# Patient Record
Sex: Female | Born: 1935
Health system: Southern US, Community
[De-identification: ages and names within clinical notes are randomized; demographics above are authoritative.]

## PROBLEM LIST (undated history)

## (undated) DIAGNOSIS — E039 Hypothyroidism, unspecified: Secondary | ICD-10-CM

## (undated) DIAGNOSIS — D649 Anemia, unspecified: Secondary | ICD-10-CM

## (undated) DIAGNOSIS — I10 Essential (primary) hypertension: Secondary | ICD-10-CM

## (undated) DIAGNOSIS — I480 Paroxysmal atrial fibrillation: Secondary | ICD-10-CM

## (undated) DIAGNOSIS — G43909 Migraine, unspecified, not intractable, without status migrainosus: Secondary | ICD-10-CM

## (undated) DIAGNOSIS — Z973 Presence of spectacles and contact lenses: Secondary | ICD-10-CM

## (undated) DIAGNOSIS — Z950 Presence of cardiac pacemaker: Secondary | ICD-10-CM

## (undated) DIAGNOSIS — R319 Hematuria, unspecified: Secondary | ICD-10-CM

## (undated) DIAGNOSIS — J309 Allergic rhinitis, unspecified: Secondary | ICD-10-CM

## (undated) DIAGNOSIS — N3281 Overactive bladder: Secondary | ICD-10-CM

## (undated) DIAGNOSIS — C50912 Malignant neoplasm of unspecified site of left female breast: Secondary | ICD-10-CM

## (undated) DIAGNOSIS — M1990 Unspecified osteoarthritis, unspecified site: Secondary | ICD-10-CM

## (undated) DIAGNOSIS — I471 Supraventricular tachycardia: Secondary | ICD-10-CM

## (undated) DIAGNOSIS — I872 Venous insufficiency (chronic) (peripheral): Secondary | ICD-10-CM

## (undated) DIAGNOSIS — I341 Nonrheumatic mitral (valve) prolapse: Secondary | ICD-10-CM

## (undated) HISTORY — DX: Venous insufficiency (chronic) (peripheral): I87.2

## (undated) HISTORY — PX: TUBAL LIGATION: SHX77

## (undated) HISTORY — DX: Allergic rhinitis, unspecified: J30.9

## (undated) HISTORY — DX: Essential (primary) hypertension: I10

## (undated) HISTORY — PX: ABDOMINAL HYSTERECTOMY: SHX81

## (undated) HISTORY — DX: Anemia, unspecified: D64.9

## (undated) HISTORY — PX: DILATION AND CURETTAGE OF UTERUS: SHX78

## (undated) HISTORY — DX: Nonrheumatic mitral (valve) prolapse: I34.1

## (undated) HISTORY — DX: Supraventricular tachycardia: I47.1

## (undated) HISTORY — DX: Paroxysmal atrial fibrillation: I48.0

## (undated) HISTORY — DX: Migraine, unspecified, not intractable, without status migrainosus: G43.909

## (undated) HISTORY — DX: Hematuria, unspecified: R31.9

## (undated) HISTORY — PX: COLONOSCOPY: SHX174

---

## 1988-12-13 HISTORY — PX: BREAST SURGERY: SHX581

## 1998-03-27 ENCOUNTER — Ambulatory Visit (HOSPITAL_COMMUNITY): Admission: RE | Admit: 1998-03-27 | Discharge: 1998-03-27 | Payer: Self-pay | Admitting: Geriatric Medicine

## 1998-06-23 ENCOUNTER — Ambulatory Visit: Admission: RE | Admit: 1998-06-23 | Discharge: 1998-06-23 | Payer: Self-pay | Admitting: Geriatric Medicine

## 1999-07-10 ENCOUNTER — Ambulatory Visit (HOSPITAL_COMMUNITY): Admission: RE | Admit: 1999-07-10 | Discharge: 1999-07-10 | Payer: Self-pay | Admitting: Geriatric Medicine

## 1999-07-10 ENCOUNTER — Encounter: Payer: Self-pay | Admitting: Geriatric Medicine

## 2000-10-20 ENCOUNTER — Ambulatory Visit (HOSPITAL_COMMUNITY): Admission: RE | Admit: 2000-10-20 | Discharge: 2000-10-20 | Payer: Self-pay | Admitting: Gastroenterology

## 2001-02-06 ENCOUNTER — Encounter: Payer: Self-pay | Admitting: Emergency Medicine

## 2001-02-06 ENCOUNTER — Inpatient Hospital Stay (HOSPITAL_COMMUNITY): Admission: EM | Admit: 2001-02-06 | Discharge: 2001-02-08 | Payer: Self-pay | Admitting: Emergency Medicine

## 2001-02-07 ENCOUNTER — Encounter: Payer: Self-pay | Admitting: Interventional Cardiology

## 2001-02-07 ENCOUNTER — Encounter: Payer: Self-pay | Admitting: *Deleted

## 2001-02-08 ENCOUNTER — Encounter: Payer: Self-pay | Admitting: *Deleted

## 2001-09-20 ENCOUNTER — Encounter: Payer: Self-pay | Admitting: Surgery

## 2001-09-20 ENCOUNTER — Encounter (INDEPENDENT_AMBULATORY_CARE_PROVIDER_SITE_OTHER): Payer: Self-pay | Admitting: *Deleted

## 2001-09-20 ENCOUNTER — Ambulatory Visit (HOSPITAL_BASED_OUTPATIENT_CLINIC_OR_DEPARTMENT_OTHER): Admission: RE | Admit: 2001-09-20 | Discharge: 2001-09-20 | Payer: Self-pay | Admitting: Surgery

## 2002-01-04 ENCOUNTER — Encounter: Payer: Self-pay | Admitting: Orthopedic Surgery

## 2002-01-11 ENCOUNTER — Encounter: Payer: Self-pay | Admitting: Orthopedic Surgery

## 2002-01-11 ENCOUNTER — Inpatient Hospital Stay (HOSPITAL_COMMUNITY): Admission: RE | Admit: 2002-01-11 | Discharge: 2002-01-12 | Payer: Self-pay | Admitting: Orthopedic Surgery

## 2009-01-14 ENCOUNTER — Encounter: Admission: RE | Admit: 2009-01-14 | Discharge: 2009-01-14 | Payer: Self-pay | Admitting: Geriatric Medicine

## 2009-03-03 ENCOUNTER — Other Ambulatory Visit: Admission: RE | Admit: 2009-03-03 | Discharge: 2009-03-03 | Payer: Self-pay | Admitting: Geriatric Medicine

## 2009-04-30 ENCOUNTER — Encounter (HOSPITAL_BASED_OUTPATIENT_CLINIC_OR_DEPARTMENT_OTHER): Admission: RE | Admit: 2009-04-30 | Discharge: 2009-07-29 | Payer: Self-pay | Admitting: General Surgery

## 2009-05-08 ENCOUNTER — Ambulatory Visit: Payer: Self-pay | Admitting: Surgery

## 2009-05-08 ENCOUNTER — Ambulatory Visit (HOSPITAL_COMMUNITY): Admission: RE | Admit: 2009-05-08 | Discharge: 2009-05-08 | Payer: Self-pay | Admitting: General Surgery

## 2009-05-08 ENCOUNTER — Encounter (HOSPITAL_BASED_OUTPATIENT_CLINIC_OR_DEPARTMENT_OTHER): Payer: Self-pay | Admitting: General Surgery

## 2009-12-13 HISTORY — PX: CARDIAC CATHETERIZATION: SHX172

## 2010-03-24 ENCOUNTER — Encounter: Payer: Self-pay | Admitting: Internal Medicine

## 2010-03-28 ENCOUNTER — Emergency Department (HOSPITAL_COMMUNITY): Admission: EM | Admit: 2010-03-28 | Discharge: 2010-03-29 | Payer: Self-pay | Admitting: Emergency Medicine

## 2010-04-19 ENCOUNTER — Inpatient Hospital Stay (HOSPITAL_COMMUNITY): Admission: EM | Admit: 2010-04-19 | Discharge: 2010-04-21 | Payer: Self-pay | Admitting: Emergency Medicine

## 2010-04-19 ENCOUNTER — Ambulatory Visit: Payer: Self-pay | Admitting: Internal Medicine

## 2010-04-20 ENCOUNTER — Encounter (INDEPENDENT_AMBULATORY_CARE_PROVIDER_SITE_OTHER): Payer: Self-pay | Admitting: Cardiology

## 2010-04-22 ENCOUNTER — Ambulatory Visit: Payer: Self-pay | Admitting: Internal Medicine

## 2010-04-22 DIAGNOSIS — I498 Other specified cardiac arrhythmias: Secondary | ICD-10-CM | POA: Insufficient documentation

## 2010-04-22 DIAGNOSIS — I484 Atypical atrial flutter: Secondary | ICD-10-CM

## 2010-04-22 DIAGNOSIS — I059 Rheumatic mitral valve disease, unspecified: Secondary | ICD-10-CM | POA: Insufficient documentation

## 2010-04-22 DIAGNOSIS — I10 Essential (primary) hypertension: Secondary | ICD-10-CM | POA: Insufficient documentation

## 2010-04-22 HISTORY — DX: Atypical atrial flutter: I48.4

## 2010-06-11 ENCOUNTER — Telehealth: Payer: Self-pay | Admitting: Internal Medicine

## 2010-06-12 DIAGNOSIS — I471 Supraventricular tachycardia, unspecified: Secondary | ICD-10-CM | POA: Insufficient documentation

## 2010-06-12 HISTORY — DX: Supraventricular tachycardia, unspecified: I47.10

## 2010-06-12 HISTORY — DX: Supraventricular tachycardia: I47.1

## 2010-06-16 ENCOUNTER — Telehealth: Payer: Self-pay | Admitting: Internal Medicine

## 2010-06-18 ENCOUNTER — Ambulatory Visit: Payer: Self-pay | Admitting: Internal Medicine

## 2010-06-18 LAB — CONVERTED CEMR LAB
Basophils Absolute: 0 10*3/uL (ref 0.0–0.1)
CO2: 26 meq/L (ref 19–32)
Chloride: 105 meq/L (ref 96–112)
GFR calc non Af Amer: 75.33 mL/min (ref >60)
Glucose, Bld: 85 mg/dL (ref 70–99)
HCT: 35 % — ABNORMAL LOW (ref 36.0–46.0)
Hemoglobin: 11.9 g/dL — ABNORMAL LOW (ref 12.0–15.0)
Lymphs Abs: 1.3 10*3/uL (ref 0.7–4.0)
MCHC: 34 g/dL (ref 30.0–36.0)
MCV: 84.7 fL (ref 78.0–100.0)
Monocytes Absolute: 0.3 10*3/uL (ref 0.1–1.0)
Monocytes Relative: 6.5 % (ref 3.0–12.0)
Neutro Abs: 2.6 10*3/uL (ref 1.4–7.7)
Potassium: 4.1 meq/L (ref 3.5–5.1)
RDW: 15.7 % — ABNORMAL HIGH (ref 11.5–14.6)
Sodium: 138 meq/L (ref 135–145)

## 2010-06-25 ENCOUNTER — Ambulatory Visit (HOSPITAL_COMMUNITY): Admission: RE | Admit: 2010-06-25 | Discharge: 2010-06-25 | Payer: Self-pay | Admitting: Internal Medicine

## 2010-06-25 ENCOUNTER — Ambulatory Visit: Payer: Self-pay | Admitting: Internal Medicine

## 2010-06-30 ENCOUNTER — Ambulatory Visit: Payer: Self-pay | Admitting: Internal Medicine

## 2010-06-30 ENCOUNTER — Telehealth: Payer: Self-pay | Admitting: Internal Medicine

## 2010-08-12 ENCOUNTER — Ambulatory Visit: Payer: Self-pay | Admitting: Internal Medicine

## 2011-01-12 NOTE — Assessment & Plan Note (Signed)
Summary: rov/eval for ablation/per dr Nashya Garlington   Visit Type:  Follow-up Primary Provider:  Merlene Laughter, MD   History of Present Illness: [name] returns today for followup of SVT.  She is a pleasant 77 yo woman with a long h/o tachypalpitations and SVT.  These episodes start and stop suddenly and are associated with dizzyness.  The patient has had recurrent symptoms and documented SVT on medical therapy with beta blockers.  When I saw her several weeks ago, I recommended an ablation but she was not sure she wanted to proceed but after talking with Dr. Mayford Knife is referred back for ablation.  No syncope or chest pain.  Current Medications (verified): 1)  Protonix 40 Mg Tbec (Pantoprazole Sodium) .... Take One Tablet By Mouth Once Daily. 2)  Metoprolol Succinate 100 Mg Xr24h-Tab (Metoprolol Succinate) .... Take One-Half  Tablet By Mouth Daily 3)  Aspirin 81 Mg Tbec (Aspirin) .... Take One Tablet By Mouth Daily 4)  Oxybutynin Chloride 10 Mg Xr24h-Tab (Oxybutynin Chloride) .... Take One Tablet By Mouth Once Daily. 5)  Warfarin Sodium 5 Mg Tabs (Warfarin Sodium) .... Use As Directed By Anticoagulation Clinic  Allergies (verified): No Known Drug Allergies  Past History:  Past Medical History: Last updated: 04/22/2010 Current Problems:  HYPERTENSION (ICD-401.9) ATRIAL FLUTTER (ICD-427.32) MITRAL VALVE PROLAPSE (ICD-424.0) SUPRAVENTRICULAR TACHYCARDIA (ICD-427.89)    Review of Systems  The patient denies chest pain, syncope, dyspnea on exertion, and peripheral edema.    Vital Signs:  Patient profile:   77 year old female Height:      69 inches Weight:      156 pounds BMI:     23.12 Pulse rate:   53 / minute BP sitting:   142 / 80  (left arm)  Vitals Entered By: Laurance Flatten CMA (June 18, 2010 8:21 AM)  Physical Exam  General:  Elderly, well developed, well nourished, in no acute distress.  HEENT: normal Neck: supple. No JVD. Carotids 2+ bilaterally no bruits Cor: RRR no  rubs, gallops or murmur Lungs: CTA Ab: soft, nontender. nondistended. No HSM. Good bowel sounds Ext: warm. no cyanosis, clubbing or edema Neuro: alert and oriented. Grossly nonfocal. affect pleasant    Impression & Recommendations:  Problem # 1:  SUPRAVENTRICULAR TACHYCARDIA (ICD-427.89) Her sym;ptoms and frequency of SVT seemed to have increased.  I have recommended we proceed with ablation and the risks/benefits/goals/expectations of the procedure have been discussed with the patient and she wishes to proceed. Her updated medication list for this problem includes:    Metoprolol Succinate 100 Mg Xr24h-tab (Metoprolol succinate) .Marland Kitchen... Take one-half  tablet by mouth daily    Aspirin 81 Mg Tbec (Aspirin) .Marland Kitchen... Take one tablet by mouth daily    Warfarin Sodium 5 Mg Tabs (Warfarin sodium) ..... Use as directed by anticoagulation clinic  Orders: TLB-BMP (Basic Metabolic Panel-BMET) (80048-METABOL) TLB-CBC Platelet - w/Differential (85025-CBCD) TLB-PT (Protime) (85610-PTP) TLB-PTT (85730-PTTL)  Problem # 2:  HYPERTENSION (ICD-401.9) Her blood pressure is moderately well controlled.   Her updated medication list for this problem includes:    Metoprolol Succinate 100 Mg Xr24h-tab (Metoprolol succinate) .Marland Kitchen... Take one-half  tablet by mouth daily    Aspirin 81 Mg Tbec (Aspirin) .Marland Kitchen... Take one tablet by mouth daily  Patient Instructions: 1)  Your physician has recommended that you have an ablation.  Catheter ablation is a medical procedure used to treat some cardiac arrhythmias (irregular heartbeats). During catheter ablation, a long, thin, flexible tube is put into a blood vessel in  your groin (upper thigh), or neck. This tube is called an ablation catheter. It is then guided to your heart through the blood vessel. Radiofrequency waves destroy small areas of heart tissue where abnormal heartbeats may cause an arrhythmia to start.  Please see the instruction sheet given to you today.

## 2011-01-12 NOTE — Letter (Signed)
Summary: Dr Gloris Manchester Turner's Office Note  Dr Gloris Manchester Turner's Office Note   Imported By: Roderic Ovens 04/30/2010 16:14:03  _____________________________________________________________________  External Attachment:    Type:   Image     Comment:   External Document

## 2011-01-12 NOTE — Progress Notes (Signed)
Summary: ablation  Phone Note From Other Clinic Call back at [facility] Phone [phone]   Caller: nurse Efraim Kaufmann Summary of Call: Per Melissa Dr Norris Cross N[place]. Dr Mayford Knife would like for pt to have ablation. she is stil having SVT. Ofc will fax recent strips on pt. Pt on vacation now but they would like for [place] to call her and set this up.  Initial call taken by: Edman Circle,  June 11, 2010 4:31 PM  Follow-up for Phone Call        [facility] for pt to call if she would like to proceed with the procedure Dennis Bast, RN, BSN  June 11, 2010 4:49 PM

## 2011-01-12 NOTE — Assessment & Plan Note (Signed)
Summary: eph/amber   Visit Type:  Follow-up Primary Provider:  Merlene Laughter, MD   History of Present Illness: [name] returns today for followup of SVT, s/p ablation.  She is a pleasant 77 yo woman with a long h/o tachypalpitations and SVT.  These episodes start and stop suddenly and are associated with dizziness.  She underwent EPS/RFA several weeks ago and she has done well since then.  She has had atrial fibrillation in the past but no symptoms.    Current Medications (verified): 1)  Protonix 40 Mg Tbec (Pantoprazole Sodium) .... Take One Tablet By Mouth Once Daily. 2)  Metoprolol Succinate 100 Mg Xr24h-Tab (Metoprolol Succinate) .... Take One-Half  Tablet By Mouth Daily 3)  Aspirin 81 Mg Tbec (Aspirin) .... Take One Tablet By Mouth Daily 4)  Oxybutynin Chloride 10 Mg Xr24h-Tab (Oxybutynin Chloride) .... Take One Tablet By Mouth Once Daily. 5)  Warfarin Sodium 5 Mg Tabs (Warfarin Sodium) .... Use As Directed By Anticoagulation Clinic 6)  Vitamin B-12 1000 Mcg Tabs (Cyanocobalamin) .... Once Daily  Allergies (verified): 1)  ! Percodan  Past History:  Past Medical History: Last updated: 04/22/2010 Current Problems:  HYPERTENSION (ICD-401.9) ATRIAL FLUTTER (ICD-427.32) MITRAL VALVE PROLAPSE (ICD-424.0) SUPRAVENTRICULAR TACHYCARDIA (ICD-427.89)    Review of Systems  The patient denies chest pain, syncope, dyspnea on exertion, and peripheral edema.    Vital Signs:  Patient profile:   77 year old female Height:      69 inches Weight:      156 pounds BMI:     23.12 Pulse rate:   51 / minute BP sitting:   120 / 82  (left arm)  Vitals Entered By: Laurance Flatten CMA (August 12, 2010 11:48 AM)  Physical Exam  General:  Elderly, well developed, well nourished, in no acute distress.  HEENT: normal Neck: supple. No JVD. Carotids 2+ bilaterally no bruits Cor: RRR no rubs, gallops or murmur Lungs: CTA Ab: soft, nontender. nondistended. No HSM. Good bowel sounds Ext: warm.  no cyanosis, clubbing or edema Neuro: alert and oriented. Grossly nonfocal. affect pleasant    EKG  Procedure date:  08/12/2010  Findings:      Sinus bradycardia with rate of:  51. First degree AV-Block noted.  Left ventricular hypertrophy.    Impression & Recommendations:  Problem # 1:  SUPRAVENTRICULAR TACHYCARDIA (ICD-427.89) She is stable after ablation.  Will continue current meds.  I have asked her to stop her current meds. Her updated medication list for this problem includes:    Metoprolol Succinate 100 Mg Xr24h-tab (Metoprolol succinate) .Marland Kitchen... Take 1/2 tablet on monday, wednesday, and friday. stop medication the following week.    Aspirin 81 Mg Tbec (Aspirin) .Marland Kitchen... Take one tablet by mouth daily    Warfarin Sodium 5 Mg Tabs (Warfarin sodium) ..... Use as directed by anticoagulation clinic  Orders: EKG w/ Interpretation (93000)  Problem # 2:  HYPERTENSION (ICD-401.9) Her blood pressure is well controlled. A low sodium diet is requested. Her updated medication list for this problem includes:    Metoprolol Succinate 100 Mg Xr24h-tab (Metoprolol succinate) .Marland Kitchen... Take 1/2 tablet on monday, wednesday, and friday. stop medication the following week.    Aspirin 81 Mg Tbec (Aspirin) .Marland Kitchen... Take one tablet by mouth daily  Problem # 3:  ATRIAL FLUTTER (ICD-427.32) Her atrial arrhythmias are quiet.  I will plan to stop her warfarin if she maintains NSR. Her updated medication list for this problem includes:    Metoprolol Succinate 100 Mg Xr24h-tab (Metoprolol  succinate) .Marland Kitchen... Take 1/2 tablet on monday, wednesday, and friday. stop medication the following week.    Aspirin 81 Mg Tbec (Aspirin) .Marland Kitchen... Take one tablet by mouth daily    Warfarin Sodium 5 Mg Tabs (Warfarin sodium) ..... Use as directed by anticoagulation clinic  Orders: EKG w/ Interpretation (93000)  Patient Instructions: 1)  Your physician recommends that you schedule a follow-up appointment in: 6 months with Dr.  Ladona Ridgel. 2)  Your physician has recommended you make the following change in your medication: Decrease Metoprolol to 1/2 tablet on M, W, and Friday for one week and then stop.

## 2011-01-12 NOTE — Assessment & Plan Note (Signed)
Summary: NURSE/LEG PAIN/SAF  Nurse Visit   Vital Signs:  Patient profile:   77 year old female Weight:      156 pounds Pulse rate:   55 / minute BP sitting:   152 / 78  (left arm)  Vitals Entered By: Meredith Staggers, RN (June 30, 2010 1:20 PM)  Impression & Recommendations:  Problem # 1:  Bruise at groin site After phone conversation this AM pt noticed a knot at her groin which she states is new and was very concerned by this and the color of the bruise.  Right groin had yellowish/green bruise that was soft to palpate, there was a tiny knot palpable right at site, explained to pt that these were all a normal part of healing process.  Discussed that if the knot were to get much bigger then she should call our office to discuss before walking in.  Reassured pt again she states she feels much better   Allergies: No Known Drug Allergies

## 2011-01-12 NOTE — Progress Notes (Signed)
Summary: around site is black   Phone Note Call from Patient Call back at Home Phone [phone]   Caller: Patient Summary of Call: Pt have question about the ablation she had on 06/25/10 around the site is black Initial call taken by: Judie Grieve,  June 30, 2010 9:46 AM  Follow-up for Phone Call        pt has bruising around groin site, explained this was normal, it is soft, no knot, no oozing or bleeding, pt will monitor and let us know if it gets hard or she notices a knot Meredith Staggers, RN  June 30, 2010 9:56 AM

## 2011-01-12 NOTE — Progress Notes (Signed)
Summary: returning call  Phone Note Call from Patient Call back at Home Phone [phone] Call back at [phone]   Caller: Patient Summary of Call: Pt is going to be on vacation almost whole month of July. Please call pt on her cell phone to discuss the procedure and if she needs to have it done now or if it is something that she needs to make other arrangements for her vacation. cell number is  [phone]. Pt is aware that you are not in the office til 7/11.  Initial call taken by: Edman Circle,  June 16, 2010 8:52 AM  Follow-up for Phone Call        Appt 7-7 with GT. Gypsy Balsam RN BSN  June 17, 2010 5:02 PM

## 2011-01-12 NOTE — Assessment & Plan Note (Signed)
Summary: NEP/EVAL FOR ABLATION/SPV VS AFLUTTER/2.1 BLOCK   Visit Type:  Initial Consult Primary Provider:  Merlene Laughter, MD   History of Present Illness: [name] is referred today for evaluation of SVT by Dr. Mayford Knife.  She is a pleasant 77 yo woman with a longstanding h/o palpitations dating back to the 54's.  She had documented SVT approximately 10 yrs ago at 175/min.  Since then she has had frequent episodes which typically are not prolonged, lasting minutes but not hours at a time.  The patient has also had a h/o c/p and underwent a recent heart catheterization which demonstrated no obstructive CAD.  She has recently worn a cardiac monitor which demonstrated SVT at 135/min, lasting a couple of minutes.  When she is in SVT she feels weak but has never had frank syncope.  It is not clear whether her chest pain is related to her SVT. Minimal SOB.  Current Medications (verified): 1)  Protonix 40 Mg Tbec (Pantoprazole Sodium) .... Take One Tablet By Mouth Once Daily. 2)  Metoprolol Succinate 100 Mg Xr24h-Tab (Metoprolol Succinate) .... Take One Tablet By Mouth Daily 3)  Aspirin 81 Mg Tbec (Aspirin) .... Take One Tablet By Mouth Daily 4)  Oxybutynin Chloride 10 Mg Xr24h-Tab (Oxybutynin Chloride) .... Take One Tablet By Mouth Once Daily.  Allergies (verified): No Known Drug Allergies  Past History:  Past Medical History: Last updated: 04/22/2010 Current Problems:  HYPERTENSION (ICD-401.9) ATRIAL FLUTTER (ICD-427.32) MITRAL VALVE PROLAPSE (ICD-424.0) SUPRAVENTRICULAR TACHYCARDIA (ICD-427.89)    Family History: Last updated: 04/22/2010 She does not have any early coronary artery disease.   Her brother recently died of a stroke.   Social History: Last updated: 04/22/2010  She lives in [place] with her husband.  She is   retired.  She does not smoke.      Review of Systems       All systems reviewed and negative except as noted in the HPI.  Vital Signs:  Patient profile:    77 year old female Height:      69 inches Weight:      160 pounds BMI:     23.71 Pulse rate:   66 / minute BP sitting:   110 / 70  (left arm)  Vitals Entered By: Laurance Flatten CMA (Apr 22, 2010 4:07 PM)  Physical Exam  General:  Elderly, well developed, well nourished, in no acute distress.  HEENT: normal Neck: supple. No JVD. Carotids 2+ bilaterally no bruits Cor: RRR no rubs, gallops or murmur Lungs: CTA Ab: soft, nontender. nondistended. No HSM. Good bowel sounds Ext: warm. no cyanosis, clubbing or edema Neuro: alert and oriented. Grossly nonfocal. affect pleasant    EKG  Procedure date:  03/24/2010  Findings:      Normal sinus rhythm with rate of: 64. First degree AV-Block noted.    Impression & Recommendations:  Problem # 1:  SUPRAVENTRICULAR TACHYCARDIA (ICD-427.89) I have discussed the treatment options with the patient and her husband.  The risks/benefits/goals/expectations of catheter ablation was discussed with the patient and she is considering her options and will call us if she would like to proceed. Her updated medication list for this problem includes:    Metoprolol Succinate 100 Mg Xr24h-tab (Metoprolol succinate) .Marland Kitchen... Take one tablet by mouth daily    Aspirin 81 Mg Tbec (Aspirin) .Marland Kitchen... Take one tablet by mouth daily  Problem # 2:  HYPERTENSION (ICD-401.9) Her blood pressure is well controlled.  She will continue her current meds. Her updated medication  list for this problem includes:    Metoprolol Succinate 100 Mg Xr24h-tab (Metoprolol succinate) .Marland Kitchen... Take one tablet by mouth daily    Aspirin 81 Mg Tbec (Aspirin) .Marland Kitchen... Take one tablet by mouth daily

## 2011-01-12 NOTE — Letter (Signed)
Summary: ELectrophysiology/Ablation Procedure Instructions  [facility], Main Office  [address]   [place], [place] [address]   Phone: [phone]  Fax: [phone]     Electrophysiology/Ablation Procedure Instructions    You are scheduled for a(n) ABLATION on THURSDAY, June 25, 2010 at 10 AM with Dr. Ladona Ridgel.  1.  Please come to the Short Stay Center at [facility] at 8 AM on the day of your procedure.  2.  Come prepared to stay overnight.   Please bring your insurance cards and a list of your medications.  3.  Come to the [place] office on TODAY for lab work.  The lab at [facility] is open from 8:30 AM to 1:30 PM and 2:30 PM to 5:00 PM.  The lab at [facility] is open from 7:30 AM to 5:30 PM.  You do not have to be fasting.  4.  Do not have anything to eat or drink after midnight the night before your procedure.  5.  Do NOT take these medications for 2 days before your procedure unless otherwise instructed:  METOPROLOL AND WARFARIN.  All of your remaining medications may be taken with a small amount of water.  6.  Educational material received:  _____ EP   ___X__ Ablation   * Occasionally, EP studies and ablations can become lengthy.  Please make your family aware of this before your procedure starts.  Average time ranges from 2-8 hours for EP studies/ablations.  Your physician will locate your family after the procedure with the results.  * If you have any questions after you get home, please call the office at [phone].

## 2011-02-18 ENCOUNTER — Ambulatory Visit (INDEPENDENT_AMBULATORY_CARE_PROVIDER_SITE_OTHER): Payer: Medicare Other | Admitting: Internal Medicine

## 2011-02-18 ENCOUNTER — Encounter: Payer: Self-pay | Admitting: Internal Medicine

## 2011-02-18 DIAGNOSIS — I498 Other specified cardiac arrhythmias: Secondary | ICD-10-CM

## 2011-02-18 DIAGNOSIS — I10 Essential (primary) hypertension: Secondary | ICD-10-CM

## 2011-02-23 NOTE — Assessment & Plan Note (Signed)
Summary: f 34m from 08-12-10 rys   Visit Type:  Follow-up Primary Provider:  Merlene Laughter, MD   History of Present Illness: [name] returns today for followup. She is a pleasant 77 yo woman with a h/o atrial fib, SVT s/p Ablation, and HTN. She also has MVP. She denies palpitations since I last saw her. No c/p or sob or peripheral edema.  Current Medications (verified): 1)  Protonix 40 Mg Tbec (Pantoprazole Sodium) .... Take One Tablet By Mouth Once Daily. 2)  Aspirin 81 Mg Tbec (Aspirin) .... Take One Tablet By Mouth Daily 3)  Oxybutynin Chloride 10 Mg Xr24h-Tab (Oxybutynin Chloride) .... Take One Tablet By Mouth Once Daily. 4)  Warfarin Sodium 5 Mg Tabs (Warfarin Sodium) .... Use As Directed By Anticoagulation Clinic 5)  Vitamin B-12 1000 Mcg Tabs (Cyanocobalamin) .... Once Daily 6)  Bystolic 5 Mg Tabs (Nebivolol Hcl) .... Once Daily  Allergies: 1)  ! Percodan  Past History:  Past Medical History: Last updated: 04/22/2010 Current Problems:  HYPERTENSION (ICD-401.9) ATRIAL FLUTTER (ICD-427.32) MITRAL VALVE PROLAPSE (ICD-424.0) SUPRAVENTRICULAR TACHYCARDIA (ICD-427.89)    Review of Systems  The patient denies chest pain, syncope, dyspnea on exertion, and peripheral edema.    Vital Signs:  Patient profile:   77 year old female Height:      69 inches Weight:      161 pounds BMI:     23.86 Pulse rate:   49 / minute BP sitting:   112 / 62  (left arm)  Vitals Entered By: [name] CMA (February 18, 2011 10:03 AM)  Physical Exam  General:  Elderly, well developed, well nourished, in no acute distress.  HEENT: normal Neck: supple. No JVD. Carotids 2+ bilaterally no bruits Cor: RRR no rubs, gallops or murmur Lungs: CTA Ab: soft, nontender. nondistended. No HSM. Good bowel sounds Ext: warm. no cyanosis, clubbing or edema Neuro: alert and oriented. Grossly nonfocal. affect pleasant    EKG  Procedure date:  02/18/2011  Findings:      Sinus bradycardia with rate  of:  49.Left ventricular hypertrophy.    Impression & Recommendations:  Problem # 1:  SUPRAVENTRICULAR TACHYCARDIA (ICD-427.89) She has had no recurrent sustained arrhythmias. I discussed stopping coumadin and she had experienced atrial fib out of SVT in the past. I suspect she will still have some atrial fib and we will stop her ASA and continue coumadin for now. The following medications were removed from the medication list:    Metoprolol Succinate 100 Mg Xr24h-tab (Metoprolol succinate) .Marland Kitchen... Take 1/2 tablet on monday, wednesday, and friday. stop medication the following week.    Aspirin 81 Mg Tbec (Aspirin) .Marland Kitchen... Take one tablet by mouth daily Her updated medication list for this problem includes:    Warfarin Sodium 5 Mg Tabs (Warfarin sodium) ..... Use as directed by anticoagulation clinic    Bystolic 5 Mg Tabs (Nebivolol hcl) ..... Once daily  Problem # 2:  HYPERTENSION (ICD-401.9) Her blood pressure is well controlled. Continue current meds. The following medications were removed from the medication list:    Metoprolol Succinate 100 Mg Xr24h-tab (Metoprolol succinate) .Marland Kitchen... Take 1/2 tablet on monday, wednesday, and friday. stop medication the following week.    Aspirin 81 Mg Tbec (Aspirin) .Marland Kitchen... Take one tablet by mouth daily Her updated medication list for this problem includes:    Bystolic 5 Mg Tabs (Nebivolol hcl) ..... Once daily  Patient Instructions: 1)  Your physician has recommended you make the following change in your medication:  stop Aspirin 2)  Your physician wants you to follow-up in: 12 months with Dr Court Joy will receive a reminder letter in the mail two months in advance. If you don't receive a letter, please call our office to schedule the follow-up appointment.

## 2011-02-28 LAB — PROTIME-INR: Prothrombin Time: 18.6 seconds — ABNORMAL HIGH (ref 11.6–15.2)

## 2011-03-02 LAB — CBC
HCT: 33.7 % — ABNORMAL LOW (ref 36.0–46.0)
Hemoglobin: 10.1 g/dL — ABNORMAL LOW (ref 12.0–15.0)
Hemoglobin: 10.4 g/dL — ABNORMAL LOW (ref 12.0–15.0)
Hemoglobin: 11.9 g/dL — ABNORMAL LOW (ref 12.0–15.0)
MCHC: 34.8 g/dL (ref 30.0–36.0)
MCHC: 35.1 g/dL (ref 30.0–36.0)
MCV: 84.6 fL (ref 78.0–100.0)
Platelets: 198 10*3/uL (ref 150–400)
RBC: 3.46 MIL/uL — ABNORMAL LOW (ref 3.87–5.11)
RBC: 3.54 MIL/uL — ABNORMAL LOW (ref 3.87–5.11)
RBC: 4.02 MIL/uL (ref 3.87–5.11)
RBC: 4.01 MIL/uL (ref 3.87–5.11)
RDW: 15.1 % (ref 11.5–15.5)
RDW: 15.4 % (ref 11.5–15.5)
WBC: 10.2 10*3/uL (ref 4.0–10.5)

## 2011-03-02 LAB — POCT CARDIAC MARKERS
Myoglobin, poc: 82.9 ng/mL (ref 12–200)
Troponin i, poc: 0.47 ng/mL (ref 0.00–0.09)
Troponin i, poc: <0.05 ng/mL (ref 0.00–0.09)

## 2011-03-02 LAB — LIPID PANEL
Cholesterol: 128 mg/dL (ref 0–200)
HDL: 51 mg/dL (ref >39)
LDL Cholesterol: 69 mg/dL (ref 0–99)
Total CHOL/HDL Ratio: 2.5 RATIO

## 2011-03-02 LAB — BASIC METABOLIC PANEL
BUN: 21 mg/dL (ref 6–23)
CO2: 24 mEq/L (ref 19–32)
CO2: 26 mEq/L (ref 19–32)
Calcium: 8.7 mg/dL (ref 8.4–10.5)
Chloride: 108 mEq/L (ref 96–112)
GFR calc Af Amer: >60 mL/min (ref >60)
GFR calc Af Amer: >60 mL/min (ref >60)
GFR calc Af Amer: >60 mL/min (ref >60)
GFR calc non Af Amer: >60 mL/min (ref >60)
Glucose, Bld: 116 mg/dL — ABNORMAL HIGH (ref 70–99)
Potassium: 3.3 mEq/L — ABNORMAL LOW (ref 3.5–5.1)
Sodium: 138 mEq/L (ref 135–145)
Sodium: 139 mEq/L (ref 135–145)

## 2011-03-02 LAB — CK TOTAL AND CKMB (NOT AT ARMC)
Relative Index: 3.6 — ABNORMAL HIGH (ref 0.0–2.5)
Total CK: 152 U/L (ref 7–177)

## 2011-03-02 LAB — PROTIME-INR
INR: 1.3 (ref 0.00–1.49)
Prothrombin Time: 16.1 seconds — ABNORMAL HIGH (ref 11.6–15.2)

## 2011-03-02 LAB — URINALYSIS, ROUTINE W REFLEX MICROSCOPIC
Bilirubin Urine: NEGATIVE
Glucose, UA: NEGATIVE mg/dL
Ketones, ur: NEGATIVE mg/dL
Protein, ur: NEGATIVE mg/dL

## 2011-03-02 LAB — DIFFERENTIAL
Basophils Relative: 0 % (ref 0–1)
Eosinophils Relative: 2 % (ref 0–5)
Lymphocytes Relative: 10 % — ABNORMAL LOW (ref 12–46)
Lymphocytes Relative: 13 % (ref 12–46)
Lymphs Abs: 1.3 10*3/uL (ref 0.7–4.0)
Monocytes Absolute: 0.6 10*3/uL (ref 0.1–1.0)
Monocytes Relative: 5 % (ref 3–12)
Neutro Abs: 9.3 10*3/uL — ABNORMAL HIGH (ref 1.7–7.7)
Neutrophils Relative %: 81 % — ABNORMAL HIGH (ref 43–77)

## 2011-03-02 LAB — CARDIAC PANEL(CRET KIN+CKTOT+MB+TROPI)
CK, MB: 1.7 ng/mL (ref 0.3–4.0)
Relative Index: INVALID (ref 0.0–2.5)
Total CK: 87 U/L (ref 7–177)

## 2011-03-02 LAB — URINE MICROSCOPIC-ADD ON

## 2011-03-02 LAB — POCT I-STAT, CHEM 8
BUN: 19 mg/dL (ref 6–23)
Calcium, Ion: 1.04 mmol/L — ABNORMAL LOW (ref 1.12–1.32)
Chloride: 109 mEq/L (ref 96–112)
Glucose, Bld: 105 mg/dL — ABNORMAL HIGH (ref 70–99)

## 2011-03-02 LAB — URINE CULTURE

## 2011-03-02 LAB — HEPARIN LEVEL (UNFRACTIONATED): Heparin Unfractionated: <0.1 IU/mL — ABNORMAL LOW (ref 0.30–0.70)

## 2011-03-02 LAB — TROPONIN I: Troponin I: 1.12 ng/mL (ref 0.00–0.06)

## 2011-03-22 LAB — HEMOGLOBIN A1C: Hgb A1c MFr Bld: 5.6 % (ref 4.6–6.1)

## 2011-03-23 LAB — COMPREHENSIVE METABOLIC PANEL
ALT: 16 U/L (ref 0–35)
AST: 22 U/L (ref 0–37)
Albumin: 3.8 g/dL (ref 3.5–5.2)
Calcium: 8.8 mg/dL (ref 8.4–10.5)
GFR calc Af Amer: >60 mL/min (ref >60)
Sodium: 138 mEq/L (ref 135–145)
Total Protein: 7.3 g/dL (ref 6.0–8.3)

## 2011-03-23 LAB — DIFFERENTIAL
Eosinophils Absolute: 0.2 10*3/uL (ref 0.0–0.7)
Eosinophils Relative: 3 % (ref 0–5)
Lymphs Abs: 1.7 10*3/uL (ref 0.7–4.0)
Monocytes Relative: 6 % (ref 3–12)

## 2011-03-23 LAB — CBC
MCHC: 34.1 g/dL (ref 30.0–36.0)
RDW: 13.8 % (ref 11.5–15.5)

## 2011-03-23 LAB — PREALBUMIN: Prealbumin: 18.4 mg/dL (ref 18.0–45.0)

## 2011-04-27 NOTE — Assessment & Plan Note (Signed)
[facility]   NAME:  [name], [name] NO.:  [id number]   MEDICAL RECORD NO.:  [id number]      DATE OF BIRTH:  [birth year]   PHYSICIAN:  Leonie Man, M.D.    VISIT DATE:  05/13/2009                                   OFFICE VISIT   PROBLEM  R lateral leg ulcer (unspecified type)   HPI  The patient is a 77 year old female with a painful right lateral leg  ulcer.  This ulcer was biopsied and  shows some evidence of  angiodermatitis within the ulcer.  Wound Care Clinic evaluation  included:  arterial duplex Dopplers which are essentially normal.  Her sed rate is normal at 15.  Prealbumin is borderline normal at 18.4.  Complete metabolic panel is essentially normal except for a mildly  decreased serum potassium of 3.3 and an elevated glucose of 142.  Prior  to this, the patient has not had a diagnosis of diabetes mellitus.   EXAM    Temperature 98.3, pulse 53, respirations 18, blood pressure 118/63.  The wound measures 1.3 x 1.1 x 0.4 cm in depth.  The wound edges show some slough but significantly less and before she  was started on Santyl wet-to-dry.  The wound base also has some modest amount of slough.   TREATMENT:  Following topical anesthesia with 5% lidocaine, the wound  edges and wound base was debrided off of slough and debris down to clean  granulation tissues with approximately 90% of the debris removed.   DISP  We will continue her on Santyl wet-to-dry dressings again for another  week and at that time we will consider proceeding with collagen based  dressing to see if we can get this wound to begin granulating more  quickly.   In the interim, I will go ahead and check a hemoglobin A1c to see what  her diabetic status is.      Leonie Man, M.D.  Electronically Signed     PB/MEDQ  D:  05/13/2009  T:  05/14/2009  Job:  [id number]

## 2011-04-27 NOTE — Assessment & Plan Note (Signed)
[facility]   NAME:  [name], [name] NO.:  [id number]   MEDICAL RECORD NO.:  [id number]      DATE OF BIRTH:  08-Oct-1933   PHYSICIAN:  Jonelle Sports. Sevier, M.D.  VISIT DATE:  06/25/2009                                   OFFICE VISIT   HISTORY:  This 77 year old white female has been followed for a punched  out ulcer on the lateral aspect of her right distal calf.  The exact  etiology, which is unclear, but quite likely was traumatic in nature  with some underlying venous stasis disease.  For the first time a week  ago, this wound was treated with an application of Oasis and both the  nurse and the patient thinks it looks the best that it has over her  course of treatment here.   PHYSICAL EXAMINATION:  VITAL SIGNS:  Today, blood pressure is 100/69,  pulse 44, respirations 18, and temperature 97.7.   The wound itself now measures 1.4 x 0.9 cm and remains about 0.3 cm in  depth.   IMPRESSION:  Slow, but satisfactory progress; probable traumatic stasis  ulcer, right lower extremity.   DISPOSITION:  Although, Oasis was placed in this wound last week.  There  is considerable slough and some crust that I do not like the appearance  of and I have elected today to re-debride the wound, which will likely  include having removed some of the previous Oasis.  However, the wound  now looks quite clean and is satisfactory for reapplication of Oasis,  which I have done.   The plan will be to have the patient return to the nurse in 1 week  simply for an outer dressing change and then to be seen by the physician  in 2 weeks unless there are problems in the interim.           ______________________________  Jonelle Sports Cheryll Cockayne, M.D.     RES/MEDQ  D:  06/25/2009  T:  06/25/2009  Job:  [id number]

## 2011-04-27 NOTE — Assessment & Plan Note (Signed)
[facility]   NAME:  [name], [name] NO.:  [id number]   MEDICAL RECORD NO.:  [id number]      DATE OF BIRTH:  Jul 29, 1933   PHYSICIAN:  Leonie Man, M.D.    VISIT DATE:  05/19/2009                                   OFFICE VISIT   PROBLEM:  Right leg ulcer with current dimensions of which are  essentially unchanged of 1.4 x 0.8 x 0.3 cm in size.  The patient was  last treated on May 13, 2009, and underwent debridement and started on  Santyl, wet-to-dry dressings which she has been changing every other day  over the past week.  The patient continues to have some pain from the  wound, although she has not had any systemic symptoms of fevers or  chills.   PHYSICAL EXAMINATION:  Today, the patient's temperature is 97.6, pulse  66, respirations 18, and blood pressure 133/79.  We got a hemoglobin A1c  on her on her last visit which is entirely normal at 5.6.  This ulcer is  not very well characterized as to its etiology.  Vascular studies done  on her shows that she has adequate flow to her lower extremities.  A  biopsy done by her dermatologist shows angiodermatitis within the ulcer.   The wound itself shows a clean rounded edge with the surrounding skin  showing some mild erythema and some mild induration particular towards  the posterior aspect of the wound.  The base of the wound is covered  with slough and necrotic debris.   TX  Following topical anesthesia, the necrotic debris from the sides and the  base of the wound is curetted free removing approximately 70% of the  debris down to clean pink tissues.   DISP  We will continue her Santyl, wet-to-dry dressings over the next week.  She is about to complete her current dose of antibiotics.  I will  consider starting her on doxycycline 100 mg b.i.d. for the next week,  thus she does have some signs of continued infection and/or  inflammation.      Leonie Man, M.D.  Electronically  Signed     PB/MEDQ  D:  05/19/2009  T:  05/20/2009  Job:  [id number]

## 2011-04-27 NOTE — Assessment & Plan Note (Signed)
[facility]   NAME:  [name], [name] NO.:  [id number]   MEDICAL RECORD NO.:  [id number]      DATE OF BIRTH:  April 12, 1933   PHYSICIAN:  Lenon Curt. Chilton Si, M.D.   VISIT DATE:  07/17/2009                                   OFFICE VISIT   HISTORY:  A 77 year old female returns for recheck of the wound to the  right leg.  The patient is improved since last seen.  She has scab  appearance to this area.  There is no drainage.  There is minimal  discomfort.   TREATMENT:  This area was debrided; overall character was improved.  A  scab was removed from over the wound.  This was a selective debridement  using forceps.  No bleeding was encountered.  The patient tolerated the  procedure well.  No anesthesia was used.  Following this debridement, we  applied Puracol, hydrogel, adaptic, and gauze to the area of the wound.  She was advised to return in 1 week for recheck of this area.   ICD-9 code 707.12 ulcer of the calf   CODE:  454.2 varicose vein with ulcer and inflammation.  CPT code 04540.      Lenon Curt Chilton Si, M.D.  Electronically Signed     AGG/MEDQ  D:  07/17/2009  T:  07/18/2009  Job:  [id number]

## 2011-04-27 NOTE — Assessment & Plan Note (Signed)
[facility]   NAME:  [name], [name] NO.:  [id number]   MEDICAL RECORD NO.:  [id number]      DATE OF BIRTH:  [birth year]   PHYSICIAN:  Maxwell Caul, M.D. VISIT DATE:  07/03/2009                                   OFFICE VISIT   [name] is a 77 year old lady we have been following for a punched  out ulcer on her lateral aspect of her right distal calf.  This is  probably traumatic in nature with some underlying venous stasis,  although the exact etiology of this is unclear.  For the last 2 weeks,  we have been applying Oasis in an attempt to get some granulation to  fill in this small, but fairly deep wound.   On examination, she is afebrile.  The area underwent a debridement to  remove surface eschar and probably some of last week's Oasis.  The base  of it cleans up quite nicely and I reapplied the Oasis.  I think the  depth of this wound is closed in fairly nicely, currently 0.1 today,  which gives optimism that the Oasis is effective.   IMPRESSION:  Traumatic wound right leg in the setting of venous stasis.  We continued with the Oasis.           ______________________________  Maxwell Caul, M.D.     MGR/MEDQ  D:  07/03/2009  T:  07/04/2009  Job:  [id number]

## 2011-04-27 NOTE — Assessment & Plan Note (Signed)
[facility]   NAME:  [name], [name] NO.:  [id number]   MEDICAL RECORD NO.:  [id number]      DATE OF BIRTH:  17-Feb-1933   PHYSICIAN:  Dr. Jimmey Ralph              VISIT DATE:  06/02/2009                                   OFFICE VISIT   She comes for a followup to what looks like an ischemic ulcer on the  lateral aspect of her right lower leg.  She has been treated with  Puracol dressings.  I scrubbed it out and there was good pink  granulation tissue.  No evidence of any surrounding cellulitis.  The  wound is about a centimeter in diameter and I kept her on the Puracol  dressing every other day and have her come back in a week.  She has a  good popliteal pulse.  I do not feel any pedal pulses.  Her feet are  warm.  I really think this is an ischemic ulcer.  She does not have  diabetes.  If it does not go ahead and heal it perhaps would be good to  get a vascular surgeon to evaluate her.  She will come back in a week.           ______________________________  Dr. Renaee Munda  D:  06/02/2009  T:  06/02/2009  Job:  [id number]

## 2011-04-27 NOTE — Assessment & Plan Note (Signed)
[facility]   NAME:  [name], [name] NO.:  [id number]   MEDICAL RECORD NO.:  [id number]      DATE OF BIRTH:  May 02, 1933   PHYSICIAN:  Lenon Curt. Chilton Si, M.D.   VISIT DATE:  07/24/2009                                   OFFICE VISIT   HISTORY:  A 77 year old female returns for recheck of wound of the  lateral aspect of the right leg.  This has been presumed secondary to  varicose veins with ulceration.  She has made steady progress under  treatments administered by [facility].  The wound is now nearly  fully healed.  There has been in significant drainage, no discomfort,  and no odor.   PHYSICAL EXAMINATION:  Temperature 97.9, pulse 46, respirations 18,  blood pressure 114/71.  Wound of the right lower extremity calf is 0.7 x  0.4 x less than 0.1 cm depth.  It is covered by thin yellow shellac.  The wound overall appears virtually completely healed.  We hesitate to  remove any of the shellac-like covering for fear of disturbing it;  however, I do not feel that she needs to have any additional treatment.   TREATMENT:  Wound was covered with Bactroban ointment and Band-Aid  today.  She was discharged from clinic.  She was told to protect the  wound area with an ointment and Band-Aid and to wear compression  stockings daily.  She will [facility] to [facility] should there be  any future problems.   ICD-9 code 707.12 ulcer of the calf.  454.2 varicose veins with ulcer and inflammation.   CPT [id number].      Lenon Curt Chilton Si, M.D.  Electronically Signed     AGG/MEDQ  D:  07/24/2009  T:  07/25/2009  Job:  [id number]

## 2011-04-27 NOTE — Assessment & Plan Note (Signed)
[facility]   NAME:  LAYANN, BLUETT NO.:  [id number]   MEDICAL RECORD NO.:  [id number]      DATE OF BIRTH:  05/21/1933   PHYSICIAN:  Joanne Gavel, M.D.         VISIT DATE:  05/26/2009                                   OFFICE VISIT   HISTORY OF PRESENT ILLNESS:  The patient has a right leg ulcer, which  started 4-5 months ago.  She does not know what the inciting cause was.  She has recently undergone debridements, started on Santyl wet-to-dry  dressings.   PHYSICAL EXAMINATION:  Temperature 98, pulse 50, respirations 20, blood  pressure 125/70.  She has a punched out 1.8 x 0.9 x 0.3 cm ulceration on  the lateral aspect of the leg.  The margins of the wound are clean.  There is a great deal of slough in the base.   Using topical anesthesia, necrotic debris was debrided with a curette.  The wound is now pink and healthy.   PLAN:  Switch to Puracol and Hydrogel, every other day.  See in 7 days.      Joanne Gavel, M.D.  Electronically Signed     RA/MEDQ  D:  05/26/2009  T:  05/26/2009  Job:  [id number]

## 2011-04-27 NOTE — Assessment & Plan Note (Signed)
[facility]   NAME:  Tracy, Green NO.:  [id number]   MEDICAL RECORD NO.:  [id number]      DATE OF BIRTH:  07-13-1933   PHYSICIAN:  Maxwell Caul, M.D. VISIT DATE:  06/19/2009                                   OFFICE VISIT   LOCATION:  [facility].   [name] is a lady we have been following for an ulcer on her right  lateral lower extremity.  This started 4-5 months ago and it has never  been cleared unless what the exact cause of this was, there was no  trauma.  She has undergone serial debridements started on Santyl and  then wet-to-dry dressings.  Most recently, she received Puracol and a  dry dressing.  She has been treated with Cipro for an infection,  although I do not see the culture results.  Her previous right ABI was  1.2.   On examination, her temperature was 98.1.  The wound measured 1.1 x 0.6  x 0.3.  Thus underwent a non-excisional debridement to remove surface  slough and fibrous material.  It cleans up nicely.  There is no evidence  of surrounding infection.   IMPRESSION:  Probably largely venous stasis.  However, I cannot exactly  determine the etiology of this wound.  It does not appear that she has  significant arterial disease by ABI and her dorsalis pedis pulses are  palpable.  There is no evidence of an infection currently.  I went ahead  and applied Oasis, hydrogel, Mepitel and put this in a Kerlix and Coban  wrap.  Although, this is a small wound in terms of its circular  diameter, it has considerable depth.  The Oasis will be necessary to  stimulate granulation before we can look at closure of this wound.            ______________________________  Maxwell Caul, M.D.     MGR/MEDQ  D:  06/19/2009  T:  06/20/2009  Job:  [id number]

## 2011-04-27 NOTE — Assessment & Plan Note (Signed)
[facility]   NAME:  [name] NO.:  [id number]   MEDICAL RECORD NO.:  [id number]      DATE OF BIRTH:  [birth year]   PHYSICIAN:  Leonie Man, M.D.    VISIT DATE:  05/05/2009                                   OFFICE VISIT   This is an initial visit for [name] who is referred  courtesy of Dr. Dorinda Hill for evaluation of a painful right-sided  leg ulcer with dimensions of 1.4 x 1.1 x 0.5 cm.   [name] is a 77 year old white female complaining of a painful right  lateral leg lesion, which began gradually in December 2009 and has  persisted ever since.  She has had multiple attempts at treatment by her  dermatologist without any resolution of symptoms.  Recent biopsy of the  ulcer shows angiodermatitis with ulcer.  Her current treatment regimen  has been with bactroban and clobetasol.   The patient has no known allergies, although she is intolerant to  [facility].   MEDICAL ILLNESSES:  She has intermittent atrial fibrillation, which has  been controlled with medication.  She is status post total abdominal  hysterectomy with bilateral salpingo-oophorectomy and she has had a  rotator cuff repair.   CURRENT MEDICATIONS:  Clobetasol cream, bactroban, aspirin, oxybutynin,  and metoprolol.   SOCIAL HISTORY:  He is a married white employed English-speaking female.  No tobacco use.  She drinks 1 glass of wine daily.  The patient works  for [employer].   REVIEW OF SYSTEMS:  Negative in detail except as outlined above.  Very  specifically, the patient denies any symptoms of claudication and is  actually able to walk up to 3 miles without calf pain.   PHYSICAL EXAMINATION:  VITAL SIGNS:  Temperature 97.9, pulse 57,  respirations 18, blood pressure 127/72.  GENERAL:  The patient describes her pain as approximately 6/10.  Ankle-  brachial indices (ABIs on the right side are abnormal at 1.32 and left  side are  borderline at 1.22).  HEENT:  Head normocephalic.  Pupils round and regular.  No scleral  icterus.  No nasal obstruction.  Oropharynx benign.  NECK:  No thyromegaly or adenopathy.  Carotid upstrokes are normal  without bruits.  CHEST:  Clear to auscultation bilaterally.  HEART:  Regular rate and rhythm.  Currently with a rate of 57.  ABDOMEN:  Soft, nontender, and nondistended.  Bowel sounds normoactive.  There are no palpable masses and no visible hernias.  EXTREMITIES:  The limbs are cool bilaterally, but not usually so.  Capillary refill time is within normal limits.  Her pulses are trace  palpable bilaterally in the posterior, tibial, anterior, and dorsalis  pedis.  On the right leg laterally, there is a well-circumscribed ulcer  with a necrotic foul-smelling base.  The periwound skin is without edema  or erythema.   TREATMENT:  Anesthesia lidocaine 1% plain injected as a field block.  Excisional debridement of skin and subcutaneous tissue down to the  fascia of the leg.  This is followed by cultures of the wound.  Santyl  with damp-to-dry saline is applied to the wound.   DISPOSITION:  Prescriptions for Santyl and ciprofloxacin 500 mg b.i.d.  to start her on empiric antibiotics.   LABORATORY DATA:  CBC, sed rate, complete metabolic panel, and  prealbumin are ordered.  Arterial Dopplers are scheduled and  transcutaneous oxygen measurements will be done.   The patient is asked to return to the clinic in 1 week.  She will do her  dressing changes on every 2-day basis.      Leonie Man, M.D.  Electronically Signed     PB/MEDQ  D:  05/05/2009  T:  05/06/2009  Job:  [id number]   cc:   Dollene Cleveland, M.D.

## 2011-04-30 NOTE — Discharge Summary (Signed)
[name]  Patient:    [name], [name]                       MRN: [id number] Adm. Date:  [id number] Disc. Date: [id number] Attending:  Meade Maw A Dictator:   Anselm Lis, N.P. CC:         Hal T. Stoneking, M.D.   Discharge Summary  PROCEDURES: 1. On February 06, 2001, coronary angiography revealing normal left    ventricular function with ejection fraction of 65%.  Normal coronary    arteries. 2. On February 07, 2001, chest computed axial tomography revealing moderate    bilateral pleural effusions with faint infiltrates in the upper and lower    lobes bilaterally, probable pulmonary edema.  Single enlarged precarinal    lymph node measuring 1.5 cm in diameter, nonspecific finding.  No evidence    of pulmonary emboli. 3. Lower extremity Doppler ultrasounds.  No evidence of deep venous    thrombosis, supraventricular tachycardia or Bakers cyst bilaterally.  DISCHARGE DIAGNOSES: 1. Chest pain in setting of paroxysmal supraventricular tachycardia.  Initial    elevated troponin I of 0.28.  She was taken for coronary angiography on    February 06, 2001, revealing normal coronary arteries and normal left    ventricular function with an ejection fraction of 65%.  Her creatinine    kinase was negative.  Probable elevation in troponin I related to    myocardial stress from paroxysmal supraventricular tachycardia. 2. Paroxysmal supraventricular tachycardia.  Awoke the morning of admission    with sensation of heart racing as well as anterior, substernal chest    burning/pressure and feeling washed out.  She presented to primary    physicians office where electrocardiogram revealed supraventricular    tachycardia at 150 beats per minute.  She was triaged to [facility]    Emergency Room and received adenosine IV 60 mg with subsequent pause, sinus    bradycardia and then normal sinus rhythm, but quick resumption of    supraventricular tachycardia  with rate in the 150s.  She received an    additional 12 mg adenosine with subsequently nine second pause, sinus    bradycardia, then sinus rhythm, then subsequent paroxysmal supraventricular    tachycardia to 150.  Returned to sinus rhythm after administration of 0.25    intravenous digoxin and 10 mg IV Cardizem.  Sustained normal sinus rhythm    at 67 beats per minute which continued during course of admission without    recurrence of paroxysmal supraventricular tachycardia.  Her digoxin was    discontinued and she was started on Cardizem, the day prior to discharge at    180 mg p.o. q.d. which she tolerated without excessive bradycardia.  Her    digoxin will be discontinued posthospitalization. 3. Congestive heart failure following rescue for vagal maneuvers, oxygen    saturations as low as in the mid 80s.  During the course of admission, the    patient subjectively denied any shortness of breath or discomfort.  A    chest computed axial tomography was obtained to rule out pulmonary emboli    in few of patients risks factors of history of venous insufficiency and    hormone replacement therapy as well as increased white blood cell count    suggestive of a stress response.  Her test computed axial tomography    returned negative for a pulmonary emboli, but revealed congestive heart  failure.  She was diuresed with intravenous and subsequently p.o. Lasix    with some improvement in oxygen saturations.  She will be discharged home    on low-dose Lasix and potassium supplementation with followup office visit    in five days.  The patient was advised not to travel to high altitude    areas, such as [place] (a family trip had been planned there) in setting of    her mild congestive heart failure and prior paroxysmal supraventricular    tachycardia.  CONDITION ON DISCHARGE:  Stable.  DISCHARGE MEDICATIONS: 1. Baby aspirin 81 mg p.o. q.d. (coated). 2. Premarin 1.25 mg p.o. q.d. 3. (New)  Cardizem CD 180 mg p.o. q.d. 4. (New) Furosemide 20 mg p.o. q.d. 5. (New) K-Dur 20 mEq p.o. q.d.  ACTIVITY:  No heavy lifting or pushing greater than 10-15 pounds.  No driving today, then activity as before.  DIET:  Low salt diet.  WOUND CARE:  May shower.  SPECIAL INSTRUCTIONS:  Call our clinic if she develops a large amount of swelling or bruising in the groin area or undo shortness of breath.  FOLLOWUP:  Follow up with Dr. Meade Maw at 10 a.m. on Monday, February 13, 2001.  HOSPITAL COURSE:  [name] is a very pleasant 77 year old female with history of recurrent paroxysmal supraventricular tachycardia.  She awoke this morning with sensation in her heart with racing, but did not resolve as it usually does with Valsalva maneuvers.  She felt associated substernal chest burning/pressure and felt "washed out."  She had positive DOE.  She presented to Dr. Lesia Sago office where EKG revealed SVT with rate in 150s.  She was triaged to [facility] where she received adenosine IV x 2 two doses (6 mg then 12 mg).  After each dose, she had subsequent pause and then sinus bradycardia, then sinus rhythm, then resumption of PSVT.  Final resumption and maintenance of normal sinus rhythm after 0.25 mg IV digoxin and 10 mg IV Cardizem bolus.  Substernal burning, however, remained.  Her EKG revealed inferior/lateral ST and T wave abnormalities consistent with digoxin effect and/or ischemia.  Her troponin I returned elevated at 0.28.  Because of the EKG changes, chest discomfort and elevated troponin I, she was taken for coronary angiography that afternoon which was negative for significant CAD and normal LV function.  That evening, the patient had a vasovagal response to sheath pull with hypotension.  She was treated overnight with IV dopamine with good maintenance of blood pressure and with IV fluids with approximately 3 L. The next morning, she had low saturations in the mid 80s to  low 90s,  maintaining 90s on nasal cannula.  She had been put on 100% nonrebreather the night postcatheterization after fluid boluses given.  Her lungs, however, did not obviously reflect congestive heart failure.  Because of setting of hormone replacement therapy, history of venous insufficiency, low saturations and minimal clinical evidence of CHF, the patient was sent for a CAT scan which did return negative for pulmonary evidence and pulmonary emboli, although did confirm congestive heart failure.  She was given IV, then oral dose Lasix diuresis, but still O2 saturations in the low 90s on room air.  She was discharged home with low-dose Lasix and potassium supplementation with plans for followup in clinic in five days.  She was advised not to go to [place] with its high altitude in the setting of CHF postcatheterization (secondary to IV fluids) as there was also concern she  might get into trouble with CHF/worry of recurrence PSVT in setting of high altitude area.  LABORATORY DATA AND X-RAY FINDINGS:  CAT scan/venous Doppler ultrasound results with CAT scan as above.  WBC 12.9, hemoglobin 12.9, hematocrit 37.8. At the time of discharge, WBC of 10.2, hemoglobin 10.2, hematocrit 29.3, platelets 207.  Admission coagulations within normal range.  Admission sodium 135, potassium 4, chloride 103, CO2 23, BUN 22, creatinine 0.9, glucose 100, LFTs within normal range, albumin low at 3.3.  Calcium 9.  At the time of discharge, calcium 7.9.  First CK of 82, MB fraction of 3.2, troponin I 0.28. Second CK of 116, MB fraction 3.7, troponin I of 0.16.  Cholesterol profile revealed cholesterol of 128, triglycerides 111, HDL 56, LDL 50.  Admission chest x-ray was negative for active disease.  Chest x-ray on February 07, 2001, revealed significant interval increase in density and/or edema in bilateral lower lobes with possibility of inflammatory change as well as pulmonary emboli should be considered  clinically.  Followup chest x-ray on February 08, 2001, revealed mild CHF with small effusions.  PAST MEDICAL HISTORY: 1. PSVT first noted 15-20 years ago, usually resolving with valsalva    maneuvers.  This occurs about three to four times per year.  She was on    Digitek prior to admission. 2. History of questionable mitral valve prolapse. 3. Diverticulosis by barium enema in 1999. 4. History of migraines. 5. History of right carotid bruit with negative Doppler ultrasound in June    1999. 6. History of liver cyst on CAT scan in 1997. 7. History of venous insufficiency for which she wears support hose. 8. History of allergic rhinitis. 9. History of elevated TSH.  Total time preparing this discharge was greater than 30 minutes including preparing discharge paperwork, discussion of discharge plans with patient and husband and dictating discharge summary. DD:  02/08/01 TD:  02/09/01 Job: [id number]

## 2011-04-30 NOTE — Op Note (Signed)
[facility]  Patient:    [name], [name] Visit Number: [id number] MRN: [id number]          Service Type: SUR Location: 4W 0448 01 Attending Physician:  Skip Mayer Dictated by:   Georges Lynch Darrelyn Hillock, M.D. Proc. Date: 01/11/02 Admit Date:  01/11/2002                             Operative Report  SURGEON:  Windy Fast A. Gioffre, M.D.  ASSISTANT:  Alexzandrew L. Perkins, P.A.-C.  PREOPERATIVE DIAGNOSES: 1. Severe impingement syndrome, left shoulder. 2. Severe chronic subdeltoid bursitis, left shoulder. 3. Complete tear of the rotator cuff tendon, left shoulder.  OPERATION: 1. Repair of rotator cuff tendon, left shoulder; utilizing three    multi-tack sutures. 2. Partial acromionectomy and acromioplasty. 3. Excision of subdeltoid bursa, left shoulder.  DESCRIPTION OF PROCEDURE:  She first had an interscalene block in the prerecovery area.  She was taken back to surgery under general anesthesia. Initial prep done in routine orthopedic prep carried out on the left shoulder.  She had 1 g IV Ancef.  The incision was made over the anterior aspect of the left shoulder.  Bleeders identified and cauterized.  At this time I separated the deltoid tendon with the acromion, and I split the deltoid proximally and inserted self-retaining retractors.  At this time I then noted she had severe impingement syndrome; I literally could not get a piece of paper if I tried between the tendon and the acromion -- That is how narrow the space was). Obviously we did not try to put a piece of paper there, but that is just a description.  At this point, I put traction on the shoulder and then inserted the Bennett retractor and did an acromionectomy and acromioplasty.  I excised the coracoacromial ligament as well.  Following this, I noted that her bursa was chronically inflamed; it was severe.  She had more of a villous appearance of her bursa.  I excised the  bursa, went down and identified the tendon.  The tendon was gently retracted and identified along the biceps.  I then utilized the bur, to bur down the lateral articular surface of the humerus.  I then inserted three multi-tack sutures, and sutured the tendon down in place after we thoroughly irrigated the shoulder.  Following this, I then sutured the deltoid tendon back into its normal position, and repaired the muscle.  I then closed the main part of the wound in usual fashion.  Metal clips were used for the skin.  Sterile Neosporin dressing was applied.  She was placed in a pillow shoulder immobilizer. Dictated by:   Georges Lynch Darrelyn Hillock, M.D. Attending Physician:  Skip Mayer DD:  01/11/02 TD:  01/11/02 Job: [id number]

## 2011-04-30 NOTE — Op Note (Signed)
[facility]  Patient:    SORREL, CASSETTA Visit Number: [id number] MRN: [id number]          Service Type: DSU Location: [facility] Attending Physician:  Bonnetta Barry Dictated by:   Velora Heckler, M.D. Proc. Date: 09/20/01 Admit Date:  09/20/2001   CC:         Hal T. Stoneking, M.D.  Meade Maw, M.D.   Operative Report  PREOPERATIVE DIAGNOSIS:  Right breast abnormal mammogram.  POSTOPERATIVE DIAGNOSIS:  Right breast abnormal mammogram.  OPERATION PERFORMED:  Right breast excisional biopsy with wire localization.  SURGEON:  Velora Heckler, M.D.  ANESTHESIA:  Local with intravenous sedation.  PREPARATION:  Betadine.  COMPLICATIONS:  None.  ESTIMATED BLOOD LOSS:  Minimal.  INDICATIONS FOR PROCEDURE:  The patient is a 77 year old white female seen at the request of Dr. Merlene Laughter for abnormal mammogram.  Patient had a routine screening annual mammogram performed at [facility] in August 2002.  This showed an ovoid mass in the lateral right breast measuring approximately 1.2 cm in size.  Ultrasound showed this to be solid.  It represented a change from prior studies and biopsy was recommended.  DESCRIPTION OF PROCEDURE:  The procedure was done in OR #7 at the [facility].  The patient was brought to the operating room and placed in supine position on the operating room table.  Following administration of intravenous sedation, the patient was prepped and draped in the usual strict aseptic fashion.  Wire localization had been performed previously in the lateral right breast.  After ascertaining that an adequate level of anesthesia had been obtained, the skin was anesthetized with local anesthetic.  A 3 cm curvilinear incision was made at the base of the guide wire.  Dissection was carried into the subcutaneous tissues.  There is a moderate hematoma which was evacuated and hemostasis obtained with the  electrocautery.  A core of breast tissue was then excised around the guide wire.  Specimen was submitted for specimen mammography which confirms that the lesion in question is present within the specimen.  Hemostasis was obtained with the electrocautery.  Skin edges were reapproximated with interrupted 4-0 Vicryl subcuticular sutures. Wound was washed and dried and benzoin and Steri-Strips were applied.  Sterile gauze dressings were applied.  The patient was transferred to the recovery room in stable condition.  The patient tolerated the procedure well. Dictated by:   Velora Heckler, M.D. Attending Physician:  Bonnetta Barry DD:  09/20/01 TD:  09/20/01 Job: [id number]

## 2011-04-30 NOTE — Procedures (Signed)
[facility]  Patient:    [name], [name]                         MRN: [id number] Proc. Date: 10/20/00 Attending:  Verlin Grills, M.D. CC:         Hal T. Stoneking, M.D.   Procedure Report  PROCEDURE:  Colonoscopy.  PROCEDURE INDICATION:  Ms. Lannette Avellino (date of birth 02/23/2033) is a 77 year old female referred for surveillance colonoscopy to prevent colorectal cancer.  I discussed with Ms. Badami the complications associated with colonoscopy and polypectomy including intestinal bleeding and intestinal perforation.  Ms. Lewellyn has signed the operative permit.  ENDOSCOPIST:  Verlin Grills, M.D.  PREMEDICATION:  Versed 7 mg, Demerol 50 mg.  ENDOSCOPE:  Olympus pediatric colonoscope.  DESCRIPTION OF PROCEDURE:  After obtaining informed consent, the patient was placed in the left lateral decubitus position.  I administered intravenous Demerol and intravenous Versed to achieve conscious sedation for the procedure.  The patients blood pressure, oxygen saturations, and cardiac rhythm were monitored throughout the procedure and documented in the medical record.  Anal inspection was normal.  Digital rectal exam was normal.  The Olympus pediatric video colonoscope was introduced into the rectum and under direct vision advanced to the cecum, as identified by a normal appearing ileocecal valve.  Colonic preparation for the exam today was excellent.  Rectum - normal.  Sigmoid colon - normal.  Descending colon - normal.  Splenic flexure - normal.  Transverse colon - normal.  Hepatic flexure - normal.  Ascending colon - normal.  Cecum and ileocecal valve - normal.  ASSESSMENT:  Normal proctocolonoscopy to the cecum. DD:  10/20/00 TD:  10/20/00 Job: [id number]

## 2011-04-30 NOTE — Cardiovascular Report (Signed)
[facility]  Patient:    [name], [name]                         MRN: [id number] Proc. Date: 02/06/01 Attending:  Meade Maw, M.D.                        Cardiac Catheterization  REFERRING PHYSICIAN:          Hal T. Stoneking, M.D.  PROCEDURES PERFORMED: 1. Selective coronary angiography. 2. Left ventriculogram.  INDICATIONS:                  Supraventricular tachycardia associated with significant ST depression and borderline Troponins.  BRIEF HISTORY:                [name] is a 77 year old female with past medical history of PSVT for many years. She had been previously able to break the episodes of PSVT by vagal maneuvers and had been initiated on Lanoxin. On the morning of presentation, she presented with her PSVT that would not break with vagal. In the emergency room she was treated with adenosine 6 mg and 12 mg adenosine before rhythm was broken. The patient subsequently had further recurrence and was initiated on a Cardizem drip.  Her ECG revealed sinus tachycardia. There was two elements of horizontal ST depression in all leads. The patient had complaints of chest wall burning and was noted to have a Troponin I elevation of 0.2 with normal CKs. Based on these findings, it was elected to proceed with left heart catheterization.  DESCRIPTION OF PROCEDURE:  After obtaining written informed consent, the patient was brought to the cardiac catheterization lab in a postabsorptive state. Preoperative sedation was achieved using IV Versed. The right groin was prepped and draped in the usual sterile fashion. Local anesthesia was achieved using 1% Xylocaine. A #6 French hemostasis sheath was placed into the right femoral artery using modified Seldinger technique. Selective coronary angiography was performed using a JL-4 and JR-4 Judkins catheter. Single plane ventriculogram was performed in the RAO position using a #6 French pigtail curved  catheter and multiple views were obtained. All catheters exchanges were made over a guidewire. Following the procedure, the patients ACT was noted to be 198. She was transferred to the holding area. A repeat ACT will be obtained at 7:00 p.m. The sheaths will be removed when her ACT is less than 150.  HEMODYNAMIC DATA: 1. The aortic pressure is 128/68. 2. Left ventricular pressure is 129/25.  LEFT VENTRICULOGRAM:  Single plane ventriculogram revealed normal wall motion with an ejection fraction of 65%. Of note, while the patient was in the cardiac catheterization lab, she was noted to have episodes of PSVTs with the rates up to 130. The patient was not aware of this tachyarrhythmia.  CORONARY ANGIOGRAPHY: 1. Left main coronary artery: The left main coronary artery bifurcates into    the left anterior descending artery and the circumflex vessel. There is no    significant disease noted in the left main coronary artery. 2. Left anterior descending artery:  The left anterior descending artery gave    rise to a large D1, moderate D2 and ended as an apical recurrent branch.    There was no significant disease in the left anterior descending artery or    its branches. 3. Circumflex artery:  The circumflex vessel was a large to moderate sized    vessel that gave rise to  a moderate OM1, large bifurcating OM2 and ended    as an AV groove vessel. There was no significant disease in the circumflex    artery or its branches. 4. Right coronary artery:  The right coronary artery was dominant and afforded    posterior circulation and gave rise to several RV marginals, posterior    descending artery and posterolateral branch. There was no significant    disease in the right coronary artery or its branches.  IMPRESSION: 1. Normal coronary angiography. 2. Normal single plane ventriculogram. 3. Paroxysmal supraventricular tachycardia.  PLAN:  The patient will be initiated on Cardizem 240 mg p.o.q.d.   She will be monitored overnight for tolerance of this medication. If the patient has no further runs of PSVT on the Cardizem she will be discharged to home. She will need to have a Holter monitor obtained to evaluate episodes of the PSVT. These findings will be discussed with the patient. The patient additionally should be on a baby aspirin a day. DD:  02/06/01 TD:  02/07/01 Job: [id number]

## 2011-07-26 ENCOUNTER — Encounter (HOSPITAL_BASED_OUTPATIENT_CLINIC_OR_DEPARTMENT_OTHER): Payer: Medicare Other | Attending: Internal Medicine

## 2011-07-26 DIAGNOSIS — L97309 Non-pressure chronic ulcer of unspecified ankle with unspecified severity: Secondary | ICD-10-CM | POA: Insufficient documentation

## 2011-07-26 DIAGNOSIS — Z79899 Other long term (current) drug therapy: Secondary | ICD-10-CM | POA: Insufficient documentation

## 2011-07-26 DIAGNOSIS — Z7901 Long term (current) use of anticoagulants: Secondary | ICD-10-CM | POA: Insufficient documentation

## 2011-07-26 DIAGNOSIS — I872 Venous insufficiency (chronic) (peripheral): Secondary | ICD-10-CM | POA: Insufficient documentation

## 2011-07-26 DIAGNOSIS — K219 Gastro-esophageal reflux disease without esophagitis: Secondary | ICD-10-CM | POA: Insufficient documentation

## 2011-07-26 NOTE — Progress Notes (Unsigned)
[facility]  NAME:  [name], Green NO.:  [id number]  MEDICAL RECORD NO.:  [id number]      DATE OF BIRTH:  1933/08/18  PHYSICIAN:  Jonelle Sports. Sumit Branham, M.D.  VISIT DATE:  07/26/2011                                  OFFICE VISIT   HISTORY:  This 77 year old white female is previously known to me as a patient of my private practice and also has been a patient of this clinic last seen and released in August 2010.  At that time, she was treated by one of the other physicians for a stasis ulceration on the lateral aspect of the right mid calf.  In the interim, she has been in 15-mm compression stockings and has had no particular difficulties until a couple of weeks ago when in her exercise class she had some bleeding from the medial aspect of her right ankle.  She was unaware of any trauma (such as kicking it with the contralateral heel) or otherwise during that exercise and thinks the bleeding was simply spontaneous.  She did eventually get it stopped and has noted that there persists a small ulceration of that area. Accordingly, she has come here today for our evaluation and advice.  PAST MEDICAL HISTORY:  Really unchanged from that of several years ago. Her surgeries include thyroidectomy some 25 years ago, left rotator cuff surgery 10 years ago, ablation procedure for atrial fibrillation in 2011.  She has really had no other hospitalizations except in association with her atrial fibrillation.  ALLERGIES:  She has no known medicinal allergies.  REGULAR MEDICATIONS: 1. Viactiv [phone] mg twice daily. 2. Estrogen 0.625 mg taken daily. 3. Pantoprazole 40 mg once daily. 4. Vitamin B12 1000 mcg once daily. 5. Coumadin 5 mg daily. 6. Metoprolol 25 mg daily.  FAMILY HISTORY:  Not reviewed but not apparently contributory.  PERSONAL HISTORY:  The patient is married, lives with her husband.  She requires no assistance with activities of daily  living or with her personal care.  She does not smoke.  She drinks a couple of ounces of alcohol once a week.  She is physically involved in excise class at the [facility] and otherwise stays quite active.  REVIEW OF SYSTEMS:  HEAD, EYES, EARS, NOSE AND THROAT:  Unremarkable. No recent problems of concern.  CARDIOVASCULAR:  Had palpitations.  At that time, she was found to have atrial fibrillation and this became recurrent and eventually led to an ablation procedure which has been successful.  PULMONARY:  No history of allergy, pneumonia, pleurisy, and no current symptomatology.  She is a nonsmoker.  GASTROENTEROLOGY:  Does have some reflux for which she takes the medication with good effect. She has no history of GI blood loss.  No hepatobiliary disease. GENITOURINARY:  No history of urinary tract infections.  No recent symptomatology.  No kidney stones, no history of dialysis.  NEUROLOGIC: No strokes, tremors, seizures or severe headaches.  METABOLIC AND ENDOCRINE:  Subtotal thyroidectomy years ago.  Apparently, is euthyroid without medication supplement at this point.  No history of diabetes, no history of cancer or chemotherapy.  PHYSICAL EXAMINATION:  VITAL SIGNS:  Blood pressure is 136/85, pulse is 81 and regular, respirations 18, temperature 97.9. GENERAL  She is a well-developed, well-nourished  lady appearing perhaps slightly younger than her stated age of 38.  She is in no distress. SKIN:  Warm and dry. MOUTH:  Her mucous membranes are moist and pink. NECK:  Supple.  Her thyroid is not palpable.  She has no carotid bruits. CHEST:  Grossly clear to auscultation. CV:  Her heart tones are slightly distant but are regular and no definite murmur is heard. ABDOMEN:  Free of organomegaly, masses or tenderness. EXTREMITIES:  No edema.  There are marked superficial varicosities in the distal lower legs and feet bilaterally, some chronic stasis change in those same areas.  On the right  lower extremity in the "gaiter" area just proximal to the malleolus is a small ulcer measuring 0.7 x 0.4 x 0.1 cm with no drainage, no surrounding erythema, and with a fairly clean base.  IMPRESSION:  Venous stasis ulceration right lower extremity.  DISPOSITION:  I answered a number of the patient's questions regarding this and we did speak about the possibility of stronger compression hose and when we get this healed.  We  also discussed the fact that quite likely at this stage in life with no more trouble she has had she will not need intervention type procedure.  The wound required no debridement.  The area in general was clean.  It was then treated with an application of collagen with hydrogel and covered with a bit of Vaseline gauze.  That extremity was then placed in an Unna wrap.  The patient will return for followup in 1 week.          ______________________________ Jonelle Sports. Cheryll Cockayne, M.D.     RES/MEDQ  D:  07/26/2011  T:  07/26/2011  Job:  [id number]

## 2011-08-17 ENCOUNTER — Encounter (HOSPITAL_BASED_OUTPATIENT_CLINIC_OR_DEPARTMENT_OTHER): Payer: Medicare Other | Attending: Internal Medicine

## 2011-08-17 DIAGNOSIS — K219 Gastro-esophageal reflux disease without esophagitis: Secondary | ICD-10-CM | POA: Insufficient documentation

## 2011-08-17 DIAGNOSIS — I872 Venous insufficiency (chronic) (peripheral): Secondary | ICD-10-CM | POA: Insufficient documentation

## 2011-08-17 DIAGNOSIS — Z79899 Other long term (current) drug therapy: Secondary | ICD-10-CM | POA: Insufficient documentation

## 2011-08-17 DIAGNOSIS — L97309 Non-pressure chronic ulcer of unspecified ankle with unspecified severity: Secondary | ICD-10-CM | POA: Insufficient documentation

## 2011-08-17 DIAGNOSIS — Z7901 Long term (current) use of anticoagulants: Secondary | ICD-10-CM | POA: Insufficient documentation

## 2011-09-13 ENCOUNTER — Encounter (HOSPITAL_BASED_OUTPATIENT_CLINIC_OR_DEPARTMENT_OTHER): Payer: Medicare Other | Attending: Internal Medicine

## 2011-09-13 DIAGNOSIS — L97809 Non-pressure chronic ulcer of other part of unspecified lower leg with unspecified severity: Secondary | ICD-10-CM | POA: Insufficient documentation

## 2011-09-13 DIAGNOSIS — I872 Venous insufficiency (chronic) (peripheral): Secondary | ICD-10-CM | POA: Insufficient documentation

## 2011-10-18 ENCOUNTER — Encounter (HOSPITAL_BASED_OUTPATIENT_CLINIC_OR_DEPARTMENT_OTHER): Payer: Medicare Other

## 2012-01-13 ENCOUNTER — Other Ambulatory Visit: Payer: Self-pay | Admitting: Orthopedic Surgery

## 2012-01-13 ENCOUNTER — Ambulatory Visit
Admission: RE | Admit: 2012-01-13 | Discharge: 2012-01-13 | Disposition: A | Discharge status: Home / Self Care | Payer: Medicare Other | Source: Ambulatory Visit | Attending: Orthopedic Surgery | Admitting: Orthopedic Surgery

## 2012-01-13 DIAGNOSIS — R911 Solitary pulmonary nodule: Secondary | ICD-10-CM

## 2012-04-10 ENCOUNTER — Telehealth: Payer: Self-pay | Admitting: Internal Medicine

## 2012-04-10 NOTE — Telephone Encounter (Signed)
04-10-12 pt not sure if she's to follow with taylor or the dr that referred her here, will see check with pcp at next visit and call us back/mt

## 2012-05-04 ENCOUNTER — Other Ambulatory Visit: Payer: Self-pay | Admitting: Geriatric Medicine

## 2012-05-05 ENCOUNTER — Ambulatory Visit
Admission: RE | Admit: 2012-05-05 | Discharge: 2012-05-05 | Disposition: A | Discharge status: Home / Self Care | Payer: Medicare Other | Source: Ambulatory Visit | Attending: Geriatric Medicine | Admitting: Geriatric Medicine

## 2013-01-08 ENCOUNTER — Other Ambulatory Visit: Payer: Self-pay | Admitting: Radiology

## 2014-01-02 ENCOUNTER — Other Ambulatory Visit: Payer: Self-pay | Admitting: Dermatology

## 2014-05-21 ENCOUNTER — Ambulatory Visit: Payer: Medicare Other | Admitting: Cardiology

## 2014-05-24 ENCOUNTER — Encounter: Payer: Self-pay | Admitting: Cardiology

## 2014-05-24 ENCOUNTER — Ambulatory Visit (INDEPENDENT_AMBULATORY_CARE_PROVIDER_SITE_OTHER): Payer: Medicare Other | Admitting: Cardiology

## 2014-05-24 VITALS — BP 147/81 | Ht 69.0 in | Wt 162.0 lb

## 2014-05-24 DIAGNOSIS — I872 Venous insufficiency (chronic) (peripheral): Secondary | ICD-10-CM | POA: Insufficient documentation

## 2014-05-24 DIAGNOSIS — I480 Paroxysmal atrial fibrillation: Secondary | ICD-10-CM | POA: Insufficient documentation

## 2014-05-24 DIAGNOSIS — R319 Hematuria, unspecified: Secondary | ICD-10-CM | POA: Insufficient documentation

## 2014-05-24 DIAGNOSIS — I4891 Unspecified atrial fibrillation: Secondary | ICD-10-CM

## 2014-05-24 DIAGNOSIS — I471 Supraventricular tachycardia: Secondary | ICD-10-CM

## 2014-05-24 DIAGNOSIS — I498 Other specified cardiac arrhythmias: Secondary | ICD-10-CM

## 2014-05-24 DIAGNOSIS — I10 Essential (primary) hypertension: Secondary | ICD-10-CM

## 2014-05-24 DIAGNOSIS — I059 Rheumatic mitral valve disease, unspecified: Secondary | ICD-10-CM

## 2014-05-24 NOTE — Progress Notes (Signed)
[place], [address], [place]  [address] Phone: [phone] Fax:  [phone]  Date:  05/24/2014   ID:  [name], DOB 1933/02/08, MRN [id number]  PCP:  No primary provider on file.  Cardiologist:  Fransico Him, MD     History of Present Illness: [name] is a 80 y.o. female with a history of SVT s/p AVNRT ablation, HTN, MVP, PAF and chronic venous insufficiency who presents today for followup.  She is doing well.  She denies any chest pain, SOB, DOE, LE edema, dizziness, palpitations or syncope.   Wt Readings from Last 3 Encounters:  05/24/14 162 lb (73.483 kg)  02/18/11 161 lb (73.029 kg)  08/12/10 156 lb (70.761 kg)     Past Medical History  Diagnosis Date  . Hypertension   . SVT (supraventricular tachycardia) 06/2010    s/p AVNRT ablation  . MVP (mitral valve prolapse)   . Migraine headache   . Allergic rhinitis   . Hematuria     negative workup - Dr. Reece Agar  . Anemia   . PAF (paroxysmal atrial fibrillation)   . Chronic venous insufficiency     Current Outpatient Prescriptions  Medication Sig Dispense Refill  . metoprolol succinate (TOPROL-XL) 25 MG 24 hr tablet Take 25 mg by mouth daily.       Marland Kitchen oxybutynin (DITROPAN-XL) 10 MG 24 hr tablet Take 10 mg by mouth at bedtime.       . pantoprazole (PROTONIX) 40 MG tablet Take 40 mg by mouth daily.       Marland Kitchen warfarin (COUMADIN) 4 MG tablet Take 4 mg by mouth daily.      Marland Kitchen warfarin (COUMADIN) 5 MG tablet Take 5 mg by mouth daily.       No current facility-administered medications for this visit.    Allergies:    Allergies  Allergen Reactions  . Oxycodone-Aspirin     Social History:  The patient  reports that she has never smoked. She does not have any smokeless tobacco history on file. She reports that she does not drink alcohol or use illicit drugs.   Family History:  The patient's family history is not on file.   ROS:  Please see the history of present illness.      All other  systems reviewed and negative.   PHYSICAL EXAM: VS:  BP 147/81  Ht 5\' 9"  (1.753 m)  Wt 162 lb (73.483 kg)  BMI 23.91 kg/m2 Well nourished, well developed, in no acute distress HEENT: normal Neck: no JVD Cardiac:  normal S1, S2; RRR; no murmur Lungs:  clear to auscultation bilaterally, no wheezing, rhonchi or rales Abd: soft, nontender, no hepatomegaly Ext: no edema Skin: warm and dry Neuro:  CNs 2-12 intact, no focal abnormalities noted  EKG:  Sinus bradycardia by EKG with occasional PVC, LAFB - EKG 05/15/14     ASSESSMENT AND PLAN:  1. PAF maintaining sinus bradycardia - Her CHADS2VASC score is 4 (age > 2, female - 1, HTN - 1) - continue Warfarin/metoprolol - she would like to get off of the warfarin but I told her that she is too high risk and needs to stay on blood thinners.  I am ok if she wants to switch to Eliquis or Xarelto and asked her to talk with Dr. Felipa Eth who follows her anticoagulation 2. SVT with no reoccurence s/p AVNRT ablation 3. HTN - borderline control 4. MVP  Followup with me in 1 year  Signed, Tressia Miners  Radford Pax, MD 05/24/2014 9:46 AM

## 2014-05-24 NOTE — Patient Instructions (Signed)
Your physician recommends that you continue on your current medications as directed. Please refer to the Current Medication list given to you today.  Your physician wants you to follow-up in: 1 year. You will receive a reminder letter in the mail two months in advance. If you don't receive a letter, please call our office to schedule the follow-up appointment.  

## 2014-08-21 ENCOUNTER — Other Ambulatory Visit: Payer: Self-pay | Admitting: Dermatology

## 2014-08-22 ENCOUNTER — Telehealth: Payer: Self-pay | Admitting: Cardiology

## 2014-08-22 NOTE — Telephone Encounter (Signed)
Made pt aware

## 2014-08-22 NOTE — Telephone Encounter (Signed)
To Dr Turner to advise 

## 2014-08-22 NOTE — Telephone Encounter (Signed)
New message     Pt is on warfarin.  Dr Radford Pax suggested another blood thinner but pt cannot remember the name.[name] She want to call her ins co and see if they will pay.  Please call with the name of the blood thinner

## 2014-08-22 NOTE — Telephone Encounter (Signed)
Xarelto or Eliquis

## 2014-08-23 ENCOUNTER — Telehealth: Payer: Self-pay | Admitting: Cardiology

## 2014-08-23 NOTE — Telephone Encounter (Signed)
TO Dr Radford Pax this pt wants to know pro and cons with switching and which one she should switch to. I did not feel I could answer these questions. Please advise or call pt. Thanks

## 2014-08-23 NOTE — Telephone Encounter (Signed)
New message    Patient calling need to discuss medication -xarelto or alternative meds beside coumadin

## 2014-08-23 NOTE — Telephone Encounter (Signed)
She needs to talk with Dr. Felipa Eth who follows her anticoagulation

## 2014-08-26 NOTE — Telephone Encounter (Signed)
Made pt aware

## 2014-11-30 ENCOUNTER — Encounter: Payer: Self-pay | Admitting: *Deleted

## 2014-12-16 ENCOUNTER — Ambulatory Visit: Payer: Medicare Other | Admitting: Podiatry

## 2014-12-19 ENCOUNTER — Other Ambulatory Visit: Payer: Self-pay | Admitting: Internal Medicine

## 2014-12-19 ENCOUNTER — Ambulatory Visit
Admission: RE | Admit: 2014-12-19 | Discharge: 2014-12-19 | Disposition: A | Discharge status: Home / Self Care | Payer: Medicare Other | Source: Ambulatory Visit | Attending: Internal Medicine | Admitting: Internal Medicine

## 2014-12-19 DIAGNOSIS — M545 Low back pain: Secondary | ICD-10-CM

## 2015-01-15 ENCOUNTER — Other Ambulatory Visit: Payer: Self-pay | Admitting: Radiology

## 2015-01-15 DIAGNOSIS — C50912 Malignant neoplasm of unspecified site of left female breast: Secondary | ICD-10-CM

## 2015-01-15 HISTORY — PX: OTHER SURGICAL HISTORY: SHX169

## 2015-01-15 HISTORY — DX: Malignant neoplasm of unspecified site of left female breast: C50.912

## 2015-01-16 ENCOUNTER — Other Ambulatory Visit: Payer: Self-pay | Admitting: Radiology

## 2015-01-16 DIAGNOSIS — C50912 Malignant neoplasm of unspecified site of left female breast: Secondary | ICD-10-CM

## 2015-01-20 ENCOUNTER — Ambulatory Visit
Admission: RE | Admit: 2015-01-20 | Discharge: 2015-01-20 | Disposition: A | Discharge status: Home / Self Care | Payer: Medicare Other | Source: Ambulatory Visit | Attending: Radiology | Admitting: Radiology

## 2015-01-20 DIAGNOSIS — C50912 Malignant neoplasm of unspecified site of left female breast: Secondary | ICD-10-CM

## 2015-01-20 MED ORDER — GADOBENATE DIMEGLUMINE 529 MG/ML IV SOLN
15.0000 mL | Freq: Once | INTRAVENOUS | Status: AC | PRN
  Administered 2015-01-20: 15 mL via INTRAVENOUS

## 2015-01-24 ENCOUNTER — Telehealth: Payer: Self-pay | Admitting: *Deleted

## 2015-01-24 ENCOUNTER — Telehealth: Payer: Self-pay

## 2015-01-24 NOTE — Telephone Encounter (Signed)
Left vm for pt to return call to schedule new pt appt.

## 2015-01-24 NOTE — Telephone Encounter (Signed)
Patient has no SOB or CP. Completely asymptomatic.

## 2015-01-24 NOTE — Telephone Encounter (Signed)
Informed triage nurse that Dr. Radford Pax does not follow anticoagulation, Dr. Felipa Eth does, so they will have to call [place] for bridging instructions.  Triage RN st [facility] still needs cardiac clearance as well.

## 2015-01-24 NOTE — Telephone Encounter (Signed)
Please find out if patient has had any chest pain or SOB since I saw her last

## 2015-01-25 NOTE — Telephone Encounter (Signed)
Patient is stable from a cardiac standpoint for surgyer

## 2015-01-27 ENCOUNTER — Telehealth: Payer: Self-pay | Admitting: Genetic Counselor

## 2015-01-27 NOTE — Telephone Encounter (Signed)
PT IS AWARE OF GENETIC APPT. 02/10/15@1 :00

## 2015-01-27 NOTE — Telephone Encounter (Signed)
Advised patient she has been cleared from Cardiology.  Clearance printed and placed in Medical Records "to be faxed" bin.

## 2015-01-28 ENCOUNTER — Telehealth: Payer: Self-pay | Admitting: Cardiology

## 2015-01-28 NOTE — Telephone Encounter (Signed)
Received request from Nurse fax box, documents faxed for surgical clearance. To: [facility] Fax [phone] Attention: 2.16.16/km

## 2015-01-28 NOTE — Telephone Encounter (Signed)
Informed [name] surgical clearance was documented as faxed today.  Brought her attention to 2/12 phone encounter for clearance if the fax does not go through. Told [name] to contact the office if she needs anything else.

## 2015-01-28 NOTE — Telephone Encounter (Signed)
New Message     Please give Amy a call back regarding patient.  Thanks

## 2015-01-31 ENCOUNTER — Telehealth: Payer: Self-pay | Admitting: *Deleted

## 2015-01-31 ENCOUNTER — Other Ambulatory Visit (INDEPENDENT_AMBULATORY_CARE_PROVIDER_SITE_OTHER): Payer: Self-pay | Admitting: Surgery

## 2015-01-31 NOTE — Telephone Encounter (Signed)
Called pt and confirmed 02/11/15 med onc appt.  Mailed before appt letter, calendar, welcoming packet & intake form to pt.  Emailed Engineer, civil (consulting) at [employer] to make her aware.  Placed office note in Dr. Geralyn Flash box and took one to HIM to scan. Added to spreadsheet.

## 2015-02-05 ENCOUNTER — Ambulatory Visit
Admission: RE | Admit: 2015-02-05 | Discharge: 2015-02-05 | Disposition: A | Discharge status: Home / Self Care | Payer: Medicare Other | Source: Ambulatory Visit | Attending: Nurse Practitioner | Admitting: Nurse Practitioner

## 2015-02-05 ENCOUNTER — Encounter: Payer: Self-pay | Admitting: Radiation Oncology

## 2015-02-05 ENCOUNTER — Ambulatory Visit (HOSPITAL_COMMUNITY)
Admission: RE | Admit: 2015-02-05 | Discharge: 2015-02-05 | Disposition: A | Discharge status: Home / Self Care | Payer: Medicare Other | Source: Ambulatory Visit | Attending: Cardiology | Admitting: Cardiology

## 2015-02-05 ENCOUNTER — Other Ambulatory Visit: Payer: Self-pay | Admitting: Nurse Practitioner

## 2015-02-05 ENCOUNTER — Other Ambulatory Visit (HOSPITAL_COMMUNITY): Payer: Self-pay | Admitting: Geriatric Medicine

## 2015-02-05 DIAGNOSIS — M25561 Pain in right knee: Secondary | ICD-10-CM

## 2015-02-05 DIAGNOSIS — M79605 Pain in left leg: Secondary | ICD-10-CM

## 2015-02-05 DIAGNOSIS — M79604 Pain in right leg: Secondary | ICD-10-CM

## 2015-02-05 NOTE — Progress Notes (Signed)
Location of Breast Cancer: Left Breast, lower Outer Quadrant  Histology per Pathology Report:Left Breast   01/15/15 Diagnosis Breast, left, needle core biopsy, mass - INVASIVE MAMMARY CARCINOMA. - SEE COMMENT.  Receptor Status: ER(94%), PR (29%), Her2-neu (), Ki-67(19%) Correct from 01/15/15 pathology report  [name]    Past/Anticipated interventions by surgeon, if any: Dr. Alphonsa Overall -Biopsy of left breast  Past/Anticipated interventions by medical oncology, if any: Dr. Nicholas Lose - Appt. 02/11/15  Lymphedema issues, if any: None  Pain issues, if any: None  SAFETY ISSUES:  Prior radiation? No    Pacemaker/ICD? No  Possible current pregnancy?No  Is the patient on methotrexate? NO   Current Complaints / other details:    Menarche age 41, 7st preg. age 60, G35, P3,, BC x 10years, Menopause 1982, No HRT    [name], Tracy Curb, RN 02/05/2015,2:13 PM

## 2015-02-05 NOTE — Progress Notes (Signed)
Right Lower Extremity Venous Duplex Completed. No evidence for DVT or SVT. °Brianna L Mazza,RVT °

## 2015-02-06 ENCOUNTER — Ambulatory Visit
Admission: RE | Admit: 2015-02-06 | Discharge: 2015-02-06 | Disposition: A | Discharge status: Home / Self Care | Payer: Medicare Other | Source: Ambulatory Visit | Attending: Radiation Oncology | Admitting: Radiation Oncology

## 2015-02-06 ENCOUNTER — Encounter: Payer: Self-pay | Admitting: Radiation Oncology

## 2015-02-06 VITALS — BP 121/56 | HR 67 | Temp 98.2°F | Ht 69.0 in | Wt 166.8 lb

## 2015-02-06 DIAGNOSIS — Z170 Estrogen receptor positive status [ER+]: Secondary | ICD-10-CM | POA: Insufficient documentation

## 2015-02-06 DIAGNOSIS — R319 Hematuria, unspecified: Secondary | ICD-10-CM

## 2015-02-06 DIAGNOSIS — Z803 Family history of malignant neoplasm of breast: Secondary | ICD-10-CM | POA: Diagnosis not present

## 2015-02-06 DIAGNOSIS — C50512 Malignant neoplasm of lower-outer quadrant of left female breast: Secondary | ICD-10-CM | POA: Insufficient documentation

## 2015-02-06 DIAGNOSIS — Z79899 Other long term (current) drug therapy: Secondary | ICD-10-CM | POA: Insufficient documentation

## 2015-02-06 DIAGNOSIS — Z7901 Long term (current) use of anticoagulants: Secondary | ICD-10-CM | POA: Insufficient documentation

## 2015-02-06 DIAGNOSIS — I10 Essential (primary) hypertension: Secondary | ICD-10-CM | POA: Diagnosis not present

## 2015-02-06 DIAGNOSIS — I480 Paroxysmal atrial fibrillation: Secondary | ICD-10-CM | POA: Insufficient documentation

## 2015-02-06 HISTORY — DX: Malignant neoplasm of unspecified site of left female breast: C50.912

## 2015-02-06 NOTE — Progress Notes (Signed)
[facility] NEW PATIENT EVALUATION  Name: [name] MRN: [id number]  Date:   02/06/2015           DOB: 1933-04-25  Status: outpatient   CC: Tracy Argyle, MD  Tracy Overall, MD Dr. Nicholas Green   REFERRING PHYSICIAN: Alphonsa Overall, MD   DIAGNOSIS: Clinical stage IA  (T1 N0 M0) invasive lobular carcinoma of the left breast   HISTORY OF PRESENT ILLNESS:  [name] is a 81 y.o. female who is seen today through the courtesy of Dr. Alphonsa Green for evaluation of her clinical stage TI N0 invasive lobular carcinoma of the left breast.  At the time of a screening mammogram at [name] on February 1 she was felt to have a suspicious lesion at 5:00 within the left breast.  Biopsy on 01/15/2015 revealed invasive mammary carcinoma, favoring lobular carcinoma.  Her tumor was ER positive at 94%, PR positive at 29% with a Ki-67 of 19%.  HER-2/neu was negative.  Breast MR on 01/20/2015 showed a 1.7 x 1.6 x 1.4 cm enhancing mass within the lower outer quadrant of the left breast.  She was seen by Dr. Alphonsa Green and she will be seeing Dr. Lindi Green for medical oncology consultation.  She is without complaints today.  She is tentatively scheduled for surgery on March 4.  PREVIOUS RADIATION THERAPY: No   PAST MEDICAL HISTORY:  has a past medical history of Hypertension; SVT (supraventricular tachycardia) (06/2010); MVP (mitral valve prolapse); Migraine headache; Allergic rhinitis; Hematuria; Anemia; PAF (paroxysmal atrial fibrillation); Chronic venous insufficiency; and Breast cancer, left breast (01/15/15).     PAST SURGICAL HISTORY:  Past Surgical History  Procedure Laterality Date  . Left breast biopsy  01/15/15     FAMILY HISTORY: Her father died from a cardiac aneurysm at age 46.  Her mother also died from cardiac disease.  Her sister died from breast cancer, and her daughter who is now 34 has stage IV breast cancer.  She does have a sister who is alive and  well at 56.  SOCIAL HISTORY:  reports that she has never smoked. She does not have any smokeless tobacco history on file. She reports that she drinks alcohol. She reports that she does not use illicit drugs.  Married, 3 daughters.  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   ALLERGIES: Latex and Percodan   MEDICATIONS:  Current Outpatient Prescriptions  Medication Sig Dispense Refill  . apixaban (ELIQUIS) 5 MG TABS tablet Take 5 mg by mouth 2 (two) times daily.    Marland Kitchen levothyroxine (SYNTHROID, LEVOTHROID) 75 MCG tablet Take 75 mcg by mouth daily before breakfast.    . metoprolol succinate (TOPROL-XL) 25 MG 24 hr tablet Take 25 mg by mouth daily.     Marland Kitchen oxybutynin (DITROPAN-XL) 10 MG 24 hr tablet Take 10 mg by mouth at bedtime.     . pantoprazole (PROTONIX) 40 MG tablet Take 40 mg by mouth daily.      No current facility-administered medications for this encounter.     REVIEW OF SYSTEMS:  Pertinent items are noted in HPI.    PHYSICAL EXAM:  height is $RemoveB'5\' 9"'WWIHJWvp$  (1.753 m) and weight is 166 lb 12.8 oz (75.66 kg). Her temperature is 98.2 F (36.8 C). Her blood pressure is 121/56 and her pulse is 67.   Alert and oriented 81 year-old white female appearing her stated age.  Head and neck examination: Grossly unremarkable.  Nodes: Without palpable cervical, supraclavicular, or  axillary lymphadenopathy.  Chest: Lungs clear.  Breasts: There is a punctate biopsy wound along the lower outer quadrant of the left breast at 4:00.  No masses are appreciated.  On the right breast is a scar at 9:00 from a benign biopsy in the remote past.  No masses are appreciated.  Extremities: Without edema.   LABORATORY DATA:  Lab Results  Component Value Date   WBC 4.5 06/18/2010   HGB 11.9* 06/18/2010   HCT 35.0* 06/18/2010   MCV 84.7 06/18/2010   PLT 178.0 06/18/2010   Lab Results  Component Value Date   NA 138 06/18/2010   K 4.1 06/18/2010   CL 105 06/18/2010   CO2 26 06/18/2010   Lab Results   Component Value Date   ALT 16 05/08/2009   AST 22 05/08/2009   ALKPHOS 80 05/08/2009   BILITOT 0.5 05/08/2009      IMPRESSION: Stage IA (T1 N0 M0) invasive lobular carcinoma of the left breast.  I explained to the patient and her husband that her local management options include mastectomy versus partial mastectomy with or without radiation therapy, with without sentinel lymph node biopsy, and/or with without anti-estrogen therapy.  We discussed that the standard of care for patients under age 79 with a 65 year life expectancy would be radiation therapy following partial mastectomy.  We discussed the CALGB over 70 trial which shows a local breast recurrence of 2% with tamoxifen plus radiation therapy and 10% with tamoxifen alone at 12 years.  There is no survival benefit or difference in mastectomy rate.  We also discussed hypofractionated radiation therapy over a period of 3-1/2 weeks and deep inspiration breath-hold technology to avoid cardiac irradiation.  We discussed the potential acute and late toxicities of radiation therapy.  Her prognosis appears to be excellent.  I can see her back for a follow-up visit following her definitive surgery.  She does have what appears to be at least a 10 year life expectancy, and I would be willing to offer her radiation therapy, but antiestrogen therapy alone would certainly be satisfactory.   PLAN: Follow-up visit with me following definitive surgery.   I spent 45  minutes face to face with the patient and more than 50% of that time was spent in counseling and/or coordination of care.

## 2015-02-07 ENCOUNTER — Other Ambulatory Visit: Payer: Self-pay | Admitting: *Deleted

## 2015-02-07 ENCOUNTER — Encounter (HOSPITAL_BASED_OUTPATIENT_CLINIC_OR_DEPARTMENT_OTHER): Payer: Self-pay | Admitting: *Deleted

## 2015-02-07 DIAGNOSIS — C50512 Malignant neoplasm of lower-outer quadrant of left female breast: Secondary | ICD-10-CM

## 2015-02-07 NOTE — Progress Notes (Signed)
Pt to go to [facility] for [facility] 3/1-then come here for labs -has cardiac clearance for surgery dr Radford Pax-

## 2015-02-10 ENCOUNTER — Ambulatory Visit (HOSPITAL_BASED_OUTPATIENT_CLINIC_OR_DEPARTMENT_OTHER): Payer: Medicare Other | Admitting: Genetic Counselor

## 2015-02-10 ENCOUNTER — Other Ambulatory Visit: Payer: Medicare Other

## 2015-02-10 ENCOUNTER — Encounter: Payer: Self-pay | Admitting: Genetic Counselor

## 2015-02-10 ENCOUNTER — Telehealth: Payer: Self-pay | Admitting: Hematology and Oncology

## 2015-02-10 DIAGNOSIS — Z803 Family history of malignant neoplasm of breast: Secondary | ICD-10-CM

## 2015-02-10 DIAGNOSIS — C50919 Malignant neoplasm of unspecified site of unspecified female breast: Secondary | ICD-10-CM

## 2015-02-10 NOTE — Progress Notes (Signed)
REFERRING PROVIDER: Lajean Manes, MD [address] [place], [place] [address]    PRIMARY PROVIDER:  [name], MD  PRIMARY REASON FOR VISIT:  1. Breast cancer, unspecified laterality   2. Family history of breast cancer      HISTORY OF PRESENT ILLNESS:   Ms. Val, a 81 y.o. female, was seen for a [place] cancer genetics consultation at the request of Dr. Felipa Eth due to a personal and family history of cancer.  Ms. Strahan presents to clinic today to discuss the possibility of a hereditary predisposition to cancer, genetic testing, and to further clarify her future cancer risks, as well as potential cancer risks for family members.   In February 2015, at the age of 68, Ms. Pennebaker was diagnosed with cancer of the breast.  The tumor is ER+/PR+/Her2-. Her daughter, [name], was diagnosed with breast cancer 3 years ago and had genetic testing through [facility].  She was negative on the 21 gene Breast/Ovarian cancer panel.  The genes on this panel include; The Breast/Ovarian gene panel offered by [facility] includes sequencing and rearrangement analysis for the following 21 genes:  ATM, BARD1, BRCA1, BRCA2, BRIP1, CDH1, CHEK2, EPCAM, FANCC, MLH1, MSH2, MSH6, NBN, PALB2, PMS2, PTEN, RAD51C, RAD51D, STK11, TP53, and XRCC2.       CANCER HISTORY:   No history exists.     HORMONAL RISK FACTORS:  Menarche was at age 30.  First live birth at age 45.  OCP use for approximately 9 and 10 years.  Ovaries intact: yes.  Hysterectomy: no.  Menopausal status: postmenopausal.  HRT use: 0 years. Colonoscopy: yes; may have had a few polyps. Mammogram within the last year: yes. Number of breast biopsies: 1. Up to date with pelvic exams:  No longer having them. Any excessive radiation exposure in the past:  no  Past Medical History  Diagnosis Date  . Hypertension   . SVT (supraventricular tachycardia) 06/2010    s/p AVNRT ablation  . MVP (mitral valve prolapse)   .  Migraine headache   . Allergic rhinitis   . Hematuria     negative workup - Dr. Reece Agar  . Anemia   . PAF (paroxysmal atrial fibrillation)   . Chronic venous insufficiency   . Breast cancer, left breast 01/15/15    Invasive Mammary  . Wears glasses     Past Surgical History  Procedure Laterality Date  . Left breast biopsy  01/15/15  . Breast surgery  1990    rt br bx  . Abdominal hysterectomy    . Tubal ligation    . Dilation and curettage of uterus    . Colonoscopy    . Cardiac catheterization  2011    ablasion    History   Social History  . Marital Status: Married    Spouse Name: N/A  . Number of Children: 3  . Years of Education: N/A   Social History Main Topics  . Smoking status: Never Smoker   . Smokeless tobacco: Not on file  . Alcohol Use: 0.0 oz/week    0 Standard drinks or equivalent per week     Comment: ocassionally  . Drug Use: No  . Sexual Activity: Not on file   Other Topics Concern  . None   Social History Narrative     FAMILY HISTORY:  We obtained a detailed, 4-generation family history.  Significant diagnoses are listed below: Family History  Problem Relation Age of Onset  . Heart attack Brother   .  Breast cancer Paternal Aunt     dx over 26  . Throat cancer Paternal Uncle   . Breast cancer Sister     early 64s  . Breast cancer Daughter 89    Negative genetic testing   . Cancer Paternal Aunt     cancer in her leg  . Breast cancer Cousin     multiple paternal cousin   The patient's daughter was diagnosed with breast cancer at age 64 and is being seen at [facility].  Her sister was diagnosed with breast cancer in her early 22s and died of parkinson disease.  Ms. Doke paternal uncle was diagnosed with throat cancer and his daughter had breast cancer. Ms. Tisdell paternal aunt was diagnosed with breast cancer over the age of 3, and another aunt was diagnosed with cancer in her leg.  This aunt had several children who had cancer including  lung and breast. Patient's maternal ancestors are of [place] descent, and paternal ancestors are of Caucasian descent. There is no reported Ashkenazi Jewish ancestry. There is no known consanguinity.  GENETIC COUNSELING ASSESSMENT: YVONNIE SCHINKE is a 81 y.o. female with a personal and family history of breast cancer which somewhat suggestive of a hereditary cancer syndrome and predisposition to cancer. We, therefore, discussed and recommended the following at today's visit.   DISCUSSION: We reviewed the characteristics, features and inheritance patterns of hereditary cancer syndromes. We discussed that her daughter's negative test does not eliminate her chance of having a hereditary cancer syndrome, although it may reduce it slightly.  We reviewed common and less common forms of hereditary cancer syndromes including BRCA mutations, PALB2 and others.  We also discussed genetic testing, including the appropriate family members to test, the process of testing, insurance coverage and turn-around-time for results. We discussed the implications of a negative, positive and/or variant of uncertain significant result. We recommended Ms. Weng pursue genetic testing for the OvaNext gene panel.    PLAN: Despite our recommendation, Ms. Tapscott did not wish to pursue genetic testing at today's visit. She is concerned about the cost of testing and would like for [place] to have a benefits preverification performed.  We will do this through [employer].  We understand this decision, and remain available to coordinate genetic testing at any time in the future.   Lastly, we encouraged Ms. Nickle to remain in contact with cancer genetics annually so that we can continuously update the family history and inform her of any changes in cancer genetics and testing that may be of benefit for this family.   Ms.  Flott questions were answered to her satisfaction today. Our contact information was provided should additional  questions or concerns arise. Thank you for the referral and allowing [place] to share in the care of your patient.   Brixon Zhen P. Florene Glen, [place], [facility] Certified Genetic Counselor [email or web address] phone: [phone]  The patient was seen for a total of 60 minutes in face-to-face genetic counseling.  This patient was discussed with Drs. Magrinat, Lindi Adie and/or Burr Medico who agrees with the above.    _______________________________________________________________________ For Office Staff:  Number of people involved in session: 2 Was an Intern/ student involved with case: yes

## 2015-02-10 NOTE — Telephone Encounter (Signed)
pt req to move had sch conflict w another appt-gave copy of updated sch

## 2015-02-10 NOTE — Addendum Note (Signed)
Encounter addended by: Deirdre Evener, RN on: 02/10/2015  1:09 PM<BR>     Documentation filed: Charges VN

## 2015-02-11 ENCOUNTER — Ambulatory Visit: Payer: Medicare Other

## 2015-02-11 ENCOUNTER — Encounter (HOSPITAL_BASED_OUTPATIENT_CLINIC_OR_DEPARTMENT_OTHER)
Admission: RE | Admit: 2015-02-11 | Discharge: 2015-02-11 | Disposition: A | Discharge status: Home / Self Care | Payer: Medicare Other | Source: Ambulatory Visit | Attending: Surgery | Admitting: Surgery

## 2015-02-11 ENCOUNTER — Encounter: Payer: Self-pay | Admitting: *Deleted

## 2015-02-11 ENCOUNTER — Ambulatory Visit: Payer: Medicare Other | Admitting: Hematology and Oncology

## 2015-02-11 DIAGNOSIS — Z79899 Other long term (current) drug therapy: Secondary | ICD-10-CM | POA: Diagnosis not present

## 2015-02-11 DIAGNOSIS — M1990 Unspecified osteoarthritis, unspecified site: Secondary | ICD-10-CM | POA: Diagnosis not present

## 2015-02-11 DIAGNOSIS — I10 Essential (primary) hypertension: Secondary | ICD-10-CM | POA: Diagnosis not present

## 2015-02-11 DIAGNOSIS — K219 Gastro-esophageal reflux disease without esophagitis: Secondary | ICD-10-CM | POA: Diagnosis not present

## 2015-02-11 DIAGNOSIS — I4891 Unspecified atrial fibrillation: Secondary | ICD-10-CM | POA: Diagnosis not present

## 2015-02-11 DIAGNOSIS — Z7901 Long term (current) use of anticoagulants: Secondary | ICD-10-CM | POA: Diagnosis not present

## 2015-02-11 DIAGNOSIS — E079 Disorder of thyroid, unspecified: Secondary | ICD-10-CM | POA: Diagnosis not present

## 2015-02-11 DIAGNOSIS — Z170 Estrogen receptor positive status [ER+]: Secondary | ICD-10-CM | POA: Diagnosis not present

## 2015-02-11 DIAGNOSIS — Z803 Family history of malignant neoplasm of breast: Secondary | ICD-10-CM | POA: Diagnosis not present

## 2015-02-11 DIAGNOSIS — N6012 Diffuse cystic mastopathy of left breast: Secondary | ICD-10-CM | POA: Diagnosis not present

## 2015-02-11 DIAGNOSIS — C50912 Malignant neoplasm of unspecified site of left female breast: Secondary | ICD-10-CM | POA: Diagnosis present

## 2015-02-11 DIAGNOSIS — C50512 Malignant neoplasm of lower-outer quadrant of left female breast: Secondary | ICD-10-CM | POA: Diagnosis not present

## 2015-02-11 DIAGNOSIS — N6092 Unspecified benign mammary dysplasia of left breast: Secondary | ICD-10-CM | POA: Diagnosis not present

## 2015-02-11 LAB — BASIC METABOLIC PANEL WITH GFR
Anion gap: 10 (ref 5–15)
BUN: 14 mg/dL (ref 6–23)
CO2: 27 mmol/L (ref 19–32)
Calcium: 9.3 mg/dL (ref 8.4–10.5)
Chloride: 103 mmol/L (ref 96–112)
Creatinine, Ser: 0.71 mg/dL (ref 0.50–1.10)
GFR calc Af Amer: >90 mL/min
GFR calc non Af Amer: 79 mL/min — ABNORMAL LOW
Glucose, Bld: 116 mg/dL — ABNORMAL HIGH (ref 70–99)
Potassium: 4.1 mmol/L (ref 3.5–5.1)
Sodium: 140 mmol/L (ref 135–145)

## 2015-02-11 LAB — PROTIME-INR
INR: 1.17 (ref 0.00–1.49)
Prothrombin Time: 15.1 s (ref 11.6–15.2)

## 2015-02-11 LAB — APTT: aPTT: 41 s — ABNORMAL HIGH (ref 24–37)

## 2015-02-11 NOTE — Progress Notes (Signed)
[name] Psychosocial Distress Screening Clinical Social Work  Clinical Social Work was referred by distress screening protocol.  The patient scored a 6 on the Psychosocial Distress Thermometer which indicates moderate distress. Clinical Social Worker contacted patient at home to assess for distress and other psychosocial needs.  Patient stated she was doing well and was preparing for her upcoming surgery.  CSW informed patient of the support team and support services at [facility] and encouraged her to call with any questions or concerns.      ONCBCN DISTRESS SCREENING 02/06/2015  Screening Type Initial Screening  Distress experienced in past week (1-10) 6  Emotional problem type Nervousness/Anxiety;Adjusting to illness;Adjusting to appearance changes  Physical Problem type Pain;Skin dry/itchy  Physician notified of physical symptoms Yes  Referral to clinical psychology Yes    Johnnye Lana, MSW, LCSW, OSW-C Clinical Social Worker [place] [phone]

## 2015-02-11 NOTE — Progress Notes (Signed)
Called [facility] spoke with amy. She will convey results to Dr Lucia Gaskins and have him called with any changes or orders

## 2015-02-12 ENCOUNTER — Ambulatory Visit (HOSPITAL_BASED_OUTPATIENT_CLINIC_OR_DEPARTMENT_OTHER): Payer: Medicare Other | Admitting: Hematology and Oncology

## 2015-02-12 ENCOUNTER — Encounter: Payer: Self-pay | Admitting: Hematology and Oncology

## 2015-02-12 ENCOUNTER — Encounter: Payer: Self-pay | Admitting: *Deleted

## 2015-02-12 ENCOUNTER — Telehealth: Payer: Self-pay | Admitting: Hematology and Oncology

## 2015-02-12 ENCOUNTER — Ambulatory Visit: Payer: Medicare Other

## 2015-02-12 VITALS — BP 147/58 | HR 55 | Temp 97.9°F | Resp 19 | Ht 69.0 in | Wt 164.4 lb

## 2015-02-12 DIAGNOSIS — C50512 Malignant neoplasm of lower-outer quadrant of left female breast: Secondary | ICD-10-CM

## 2015-02-12 DIAGNOSIS — Z170 Estrogen receptor positive status [ER+]: Secondary | ICD-10-CM

## 2015-02-12 NOTE — Addendum Note (Signed)
Addended by: Prentiss Bells on: 02/12/2015 02:48 PM   Modules accepted: Medications

## 2015-02-12 NOTE — Progress Notes (Signed)
Checked in new pt with no financial concerns prior to seeing the dr. Informed pt if chemo is part of her treatment [name] will contact her ins to see if Josem Kaufmann is req and will obtain it if it is as well as contact foundations that offer copay assistance for chemo if needed. She has [name]'s card for any billing questions or concerns.

## 2015-02-12 NOTE — Telephone Encounter (Signed)
per Lenise-pt could come @12 :30-cld & spoke to pt & adv pt to come @12 :15 for 12:30 appt-pt understood states she will be be here

## 2015-02-12 NOTE — Assessment & Plan Note (Addendum)
Left breast biopsy: Invasive lobular cancer, grade 2, ER 94%, PR 29%, Ki-67 19%, HER-2 negative, 1.7 x 1.6 x 1.4 cm by MRI on 01/21/2015, clinical stage TI cN0 M0 stage IA  Pathology and radiology counseling:Discussed with the patient, the details of pathology including the type of breast cancer,the clinical staging, the significance of ER, PR and HER-2/neu receptors and the implications for treatment. After reviewing the pathology in detail, we proceeded to discuss the different treatment options between surgery, radiation, chemotherapy, antiestrogen therapies.  Recommendation: 1. Breast conserving surgery followed by 2. Adjuvant radiation and adjuvant antiestrogen therapy  Return to clinic after surgery to discuss adjuvant treatment options

## 2015-02-12 NOTE — Progress Notes (Unsigned)
Spoke with patient today at her new patient visit with Dr. Lindi Adie.  Gave contact information and encouraged her to call with any needs or concerns.  Patient verbalized understanding.

## 2015-02-12 NOTE — Progress Notes (Signed)
[name] CONSULT NOTE  Patient Care Team: Tracy Manes, MD as PCP - General (Internal Medicine)  CHIEF COMPLAINTS/PURPOSE OF CONSULTATION:  Newly diagnosed breast cancer  HISTORY OF PRESENTING ILLNESS:  [name] 81 y.o. female is here because of recent diagnosis of left breast cancer. Patient had a routine screening mammogram that revealed an abnormality in the left breast which was later evaluated with a biopsy on 01/15/2015 that showed invasive lobular cancer ER/PR positive HER-2 negative with a Ki-67 of 19%. She subsequently underwent a breast MRI on 01/21/2015 that revealed a 1.7 cm irregular enhancing mass. There are no abnormal lymph nodes. Patient was seen by Dr. Lucia Green for surgery and she is scheduled to undergo surgery this coming Friday. She had also seen radiation oncology with Dr. Valere Green who recommended adjuvant radiation therapy. She denies any pain discomfort or any problems in the breast. She is otherwise physically fit and active and healthy 81 year old.  I reviewed her records extensively and collaborated the history with the patient.  SUMMARY OF ONCOLOGIC HISTORY:   Breast cancer of lower-outer quadrant of left female breast   01/15/2015 Initial Diagnosis Left breast biopsy: Invasive lobular cancer, grade 2, ER 94%, PR 29%, Ki-67 19%, HER-2 negative   01/21/2015 Breast MRI Left breast:: 1.7 x 1.6 x 1.4 cm irregular enhancing mass lower outer quadrant, no abnormal lymph nodes    MEDICAL HISTORY:  Past Medical History  Diagnosis Date  . Hypertension   . SVT (supraventricular tachycardia) 06/2010    s/p AVNRT ablation  . MVP (mitral valve prolapse)   . Migraine headache   . Allergic rhinitis   . Hematuria     negative workup - Dr. Reece Green  . Anemia   . PAF (paroxysmal atrial fibrillation)   . Chronic venous insufficiency   . Breast cancer, left breast 01/15/15    Invasive Mammary  . Wears glasses     SURGICAL HISTORY: Past Surgical History   Procedure Laterality Date  . Left breast biopsy  01/15/15  . Breast surgery  1990    rt br bx  . Abdominal hysterectomy    . Tubal ligation    . Dilation and curettage of uterus    . Colonoscopy    . Cardiac catheterization  2011    ablasion    SOCIAL HISTORY: History   Social History  . Marital Status: Married    Spouse Name: N/A  . Number of Children: 3  . Years of Education: N/A   Occupational History  . Not on file.   Social History Main Topics  . Smoking status: Never Smoker   . Smokeless tobacco: Not on file  . Alcohol Use: 0.0 oz/week    0 Standard drinks or equivalent per week     Comment: ocassionally  . Drug Use: No  . Sexual Activity: Not on file   Other Topics Concern  . Not on file   Social History Narrative    FAMILY HISTORY: Family History  Problem Relation Age of Onset  . Heart attack Brother   . Breast cancer Paternal Aunt     dx over 68  . Throat cancer Paternal Uncle   . Breast cancer Sister     early 77s  . Breast cancer Daughter 13    Negative genetic testing   . Cancer Paternal Aunt     cancer in her leg  . Breast cancer Cousin     multiple paternal cousin    ALLERGIES:  is  allergic to latex and percodan.  MEDICATIONS:  Current Outpatient Prescriptions  Medication Sig Dispense Refill  . apixaban (ELIQUIS) 5 MG TABS tablet Take 5 mg by mouth 2 (two) times daily.    Marland Kitchen levothyroxine (SYNTHROID, LEVOTHROID) 75 MCG tablet Take 75 mcg by mouth daily before breakfast.    . metoprolol succinate (TOPROL-XL) 25 MG 24 hr tablet Take 25 mg by mouth daily.     Marland Kitchen oxybutynin (DITROPAN-XL) 10 MG 24 hr tablet Take 10 mg by mouth at bedtime.     . pantoprazole (PROTONIX) 40 MG tablet Take 40 mg by mouth daily.      No current facility-administered medications for this visit.    REVIEW OF SYSTEMS:   Constitutional: Denies fevers, chills or abnormal night sweats Eyes: Denies blurriness of vision, double vision or watery eyes Ears, nose,  mouth, throat, and face: Denies mucositis or sore throat Respiratory: Denies cough, dyspnea or wheezes Cardiovascular: Denies palpitation, chest discomfort or lower extremity swelling Gastrointestinal:  Denies nausea, heartburn or change in bowel habits Skin: Denies abnormal skin rashes Lymphatics: Denies new lymphadenopathy or easy bruising Neurological:Denies numbness, tingling or new weaknesses Behavioral/Psych: Mood is stable, no new changes  Breast:  Denies any palpable lumps or discharge All other systems were reviewed with the patient and are negative.  PHYSICAL EXAMINATION: ECOG PERFORMANCE STATUS: 0 - Asymptomatic  Filed Vitals:   02/12/15 1250  BP: 147/58  Pulse: 55  Temp: 97.9 F (36.6 C)  Resp: 19   Filed Weights   02/12/15 1250  Weight: 164 lb 7 oz (74.588 kg)    GENERAL:alert, no distress and comfortable SKIN: skin color, texture, turgor are normal, no rashes or significant lesions EYES: normal, conjunctiva are pink and non-injected, sclera clear OROPHARYNX:no exudate, no erythema and lips, buccal mucosa, and tongue normal  NECK: supple, thyroid normal size, non-tender, without nodularity LYMPH:  no palpable lymphadenopathy in the cervical, axillary or inguinal LUNGS: clear to auscultation and percussion with normal breathing effort HEART: regular rate & rhythm and no murmurs and no lower extremity edema ABDOMEN:abdomen soft, non-tender and normal bowel sounds Musculoskeletal:no cyanosis of digits and no clubbing  PSYCH: alert & oriented x 3 with fluent speech NEURO: no focal motor/sensory deficits BREAST: No palpable nodules in breast. No palpable axillary or supraclavicular lymphadenopathy (exam performed in the presence of a chaperone)   LABORATORY DATA:  I have reviewed the data as listed Lab Results  Component Value Date   WBC 4.5 06/18/2010   HGB 11.9* 06/18/2010   HCT 35.0* 06/18/2010   MCV 84.7 06/18/2010   PLT 178.0 06/18/2010   Lab Results   Component Value Date   NA 140 02/11/2015   K 4.1 02/11/2015   CL 103 02/11/2015   CO2 27 02/11/2015    RADIOGRAPHIC STUDIES: I have personally reviewed the radiological reports and agreed with the findings in the report. Results are summarized as above  ASSESSMENT AND PLAN:  Breast cancer of lower-outer quadrant of left female breast Left breast biopsy: Invasive lobular cancer, grade 2, ER 94%, PR 29%, Ki-67 19%, HER-2 negative, 1.7 x 1.6 x 1.4 cm by MRI on 01/21/2015, clinical stage TI cN0 M0 stage IA  Pathology and radiology counseling:Discussed with the patient, the details of pathology including the type of breast cancer,the clinical staging, the significance of ER, PR and HER-2/neu receptors and the implications for treatment. After reviewing the pathology in detail, we proceeded to discuss the different treatment options between surgery, radiation, and antiestrogen therapies.  Genetic counseling: Patient met with Santiago Glad and did not wish to pursue genetic testing because of concerns regarding the cost of testing.  Recommendation: 1. Breast conserving surgery followed by 2. Adjuvant radiation and  3. Adjuvant antiestrogen therapy  Return to clinic after surgery to discuss adjuvant treatment options  All questions were answered. The patient knows to call the clinic with any problems, questions or concerns.    Rulon Eisenmenger, MD 1:30 PM

## 2015-02-12 NOTE — Telephone Encounter (Signed)
appts made and avs printed for pt °

## 2015-02-13 NOTE — Progress Notes (Signed)
[name] 01/22/2015 9:34 AM Location: [facility] Patient #: [id number] DOB: 06/05/1933 Married / Language: [name] / Race: White Female  History of Present Illness: Patient words: new breast cancer.  The patient is a 81 year old female who presents with breast cancer. Her PCP is Dr. Felipa Eth. Her husband is with her.  She had a prior right breast biopsy several years ago for benign disease. In the computer, she had a biopsy 09/20/2001 which showed atypical lobular hyperplasia. I cannot find any information on who did the surgery. She is not on hormone therapy.  She has a sister who had breast cancer (she is dead) and a daughter, [name], that has breast cancer (I saw her here, but she went to [facility] to get treated). Her daughter, [name], has Stage IV cancer with mets to her liver. Ms. Coker thinks that she had genetic testing which was negative.  She went for a routine mammogram. She noticed nothing different in her breast. Mammogram at [facility] on 01/13/2015 shows 1.4 cm irregular lesion of left breast at the 5 o'clock position.  She had a left breast biopsy on 01/15/2015 [phone]) which showed invasive mammary cancer (probable lobular). By report the tumor is ER/PR positive, Ki67 - 15%, Her2Neu - pending. This is not typed yet.  On MRI of breast - 01/20/2015 - this is 1.7 x 1.6 cm in the lower outer quadrant of the left breast.  I discussed the options for breast cancer treatment with the patient. I discussed a multidisciplinary approach to the treatment of breast cancer, which includes medical oncology and radiation oncology. I discussed the surgical options of lumpectomy vs. mastectomy. If mastectomy, there is the possibility of reconstruction. I discussed the options of lymph node biopsy. The treatment plan depends on the pathologic staging of the tumor and the patient's personal wishes. The risks of surgery include, but are not  limited to, bleeding, infection, the need for further surgery, and nerve injury. The patient has been given literature on the treatment of breast cancer. She was presented at the Breast Ca Conference - plan includes genetic counseling, lumpectomy only (no need for lymph node biopsy), and med/rad onc consults.  For med onc she wants [employer] [place], for rad onc she wants [place].  Past Med History: 1. Card issues - sees Dr. Ashok Green SVT, MVP 2. On chronic Eliquis 3. HTN 4. Hysterectomy in the 1980's for benign disease  Social History: Married Has 3 daughters. Her middle daughter is the one who had breast cancer. I saw her briefly, [name]  Addendum Note(Tracy Alderman H. Lucia Gaskins MD; 01/26/2015 3:35 PM) Dr. Felipa Eth said we could hold the Eliquis for 3 days - no bridging required. But I would like her off the meds x 5 days. Will need to talk to him. DN 01/24/2015  Addendum Note(Daxtyn Rottenberg H. Lucia Gaskins MD; 01/30/2015 12:32 PM) Talked directly to Dr. Felipa Eth. He said that her [place] score is 2. She is low risk. We can stop the Eliquis 5 days pre op with no bridging. DN 01/30/2015  Other Problems Tracy Buffy, RN; 01/22/2015 9:39 AM) Arthritis Atrial Fibrillation Back Pain Gastroesophageal Reflux Disease High blood pressure Lump In Breast Oophorectomy Bilateral. Thyroid Disease Vascular Disease  Past Surgical History Tracy Buffy, RN; 01/22/2015 9:39 AM) Breast Biopsy Bilateral. Hysterectomy (not due to cancer) - Complete Shoulder Surgery Left.  Diagnostic Studies History Tracy Buffy, RN; 01/22/2015 9:39 AM) Colonoscopy 1-5 years ago Mammogram within last year Pap Smear >5 years ago  Allergies (Tracy Moffitt, RN; 01/22/2015 9:41 AM) Codeine Phosphate *ANALGESICS - OPIOID* Nausea, Vomiting.  Medication History (Tracy Moffitt, RN; 01/22/2015 9:42 AM) Metoprolol Tartrate (25MG Tablet, Oral daily) Active. Oxybutynin Chloride  ER (10MG Tablet ER 24HR, Oral daily) Active. Pantoprazole Sodium (40MG Tablet DR, Oral daily) Active. Levothyroxine Sodium (75MCG Tablet, Oral daily) Active. Eliquis (5MG Tablet, Oral daily) Active.  Social History (Tracy Moffitt, RN; 01/22/2015 9:39 AM) Alcohol use Occasional alcohol use. Caffeine use Carbonated beverages, Coffee, Tea. No drug use Tobacco use Never smoker.  Family History (Tracy Moffitt, RN; 01/22/2015 9:39 AM) Arthritis Brother, Mother. Breast Cancer Daughter, Family Members In General, Sister. Cancer Family Members In General. Cerebrovascular Accident Brother. Depression Sister. Diabetes Mellitus Mother. Heart Disease Brother. Heart disease in female family member before age 55 Hypertension Daughter, Mother, Sister. Migraine Headache Sister. Thyroid problems Daughter.  Pregnancy / Birth History (Tracy Moffitt, RN; 01/22/2015 9:39 AM) Age at menarche 17 years. Age of menopause 46-50 Contraceptive History Oral contraceptives. Gravida 4 Irregular periods Maternal age 26-30 Para 3  Review of Systems (Tracy Moffitt RN; 01/22/2015 9:39 AM) General Not Present- Appetite Loss, Chills, Fatigue, Fever, Night Sweats, Weight Gain and Weight Loss. Skin Not Present- Change in Wart/Mole, Dryness, Hives, Jaundice, New Lesions, Non-Healing Wounds, Rash and Ulcer. HEENT Present- Wears glasses/contact lenses. Not Present- Earache, Hearing Loss, Hoarseness, Nose Bleed, Oral Ulcers, Ringing in the Ears, Seasonal Allergies, Sinus Pain, Sore Throat, Visual Disturbances and Yellow Eyes. Breast Present- Breast Mass. Not Present- Breast Pain, Nipple Discharge and Skin Changes. Cardiovascular Present- Leg Cramps. Not Present- Chest Pain, Difficulty Breathing Lying Down, Palpitations, Rapid Heart Rate, Shortness of Breath and Swelling of Extremities. Gastrointestinal Not Present- Abdominal Pain, Bloating, Bloody Stool, Change in Bowel Habits, Chronic  diarrhea, Constipation, Difficulty Swallowing, Excessive gas, Gets full quickly at meals, Hemorrhoids, Indigestion, Nausea, Rectal Pain and Vomiting. Female Genitourinary Present- Nocturia. Not Present- Frequency, Painful Urination, Pelvic Pain and Urgency. Musculoskeletal Present- Back Pain and Muscle Pain. Not Present- Joint Pain, Joint Stiffness, Muscle Weakness and Swelling of Extremities. Neurological Not Present- Decreased Memory, Fainting, Headaches, Numbness, Seizures, Tingling, Tremor, Trouble walking and Weakness. Psychiatric Present- Anxiety. Not Present- Bipolar, Change in Sleep Pattern, Depression, Fearful and Frequent crying. Endocrine Present- Cold Intolerance. Not Present- Excessive Hunger, Hair Changes, Heat Intolerance, Hot flashes and New Diabetes. Hematology Present- Easy Bruising. Not Present- Excessive bleeding, Gland problems, HIV and Persistent Infections.   Vitals (Tracy Moffitt RN; 01/22/2015 9:40 AM) 01/22/2015 9:40 AM Weight: 162.6 lb Height: 69in Body Surface Area: 1.89 m Body Mass Index: 24.01 kg/m Temp.: 97.5F(Oral)  Pulse: 60 (Regular)  Resp.: 18 (Unlabored)  BP: 136/78 (Sitting, Left Arm, Standard)   Physical Exam: General: WN older WF alert. She is a little forgetful and had a little trouble following our conversation. But between her husband and her, I think she understands things well. I gave her copies of her path and MRI report.  HEENT: Normal. Pupils equal. Good dentition.  Neck: Supple. No mass. No thyroid mass.  Lymph Nodes: No supraclavicular or cervical or axilllary nodes.  Breasts: Right: No mass or nodule. She has a scar at 3 o'clock from a prior biopsy. Left: She has a bruise at 4 o'clock with a mass effect in that area of the breast.  Lungs: Clear to auscultation and symmetric breath sounds. Heart: RRR. No murmur or rub.  Abdomen: Soft. No mass. No tenderness. No hernia. Normal bowel sounds. Well healed  lower midline abdominal scar. Rectal: Not done.  Extremities: Good strength and   ROM in upper and lower extremities.  Neurologic: Grossly intact to motor and sensory function. Psychiatric: Has normal mood and affect. Behavior is normal.    Assessment & Plan: 1.  BREAST CANCER, STAGE 1, LEFT (174.9  C50.912)  Story: She had a left breast biopsy on 01/15/2015 ([id number]) which showed invasive mammary cancer (probable lobular).  On MRI of breast - 01/20/2015 - this is 1.7 x 1.6 cm in the lower outer quadrant of the left breast.  Impression: Medical and radiation oncology consult. (she requests Gudena/Magrinat/Murray)     Hold Eliquis for 5 days.  Genetic consultation.   For left breast lumpectomy with seed localization 02/14/2015  2.  ANTICOAGULATED (V58.61  Z79.01)  Story: On Eliquis - sees Dr. T. Turner  David Newman, MD, FACS [facility] Pager: [phone] Office phone:  [phone]    

## 2015-02-14 ENCOUNTER — Ambulatory Visit (HOSPITAL_BASED_OUTPATIENT_CLINIC_OR_DEPARTMENT_OTHER): Payer: Medicare Other | Admitting: Certified Registered"

## 2015-02-14 ENCOUNTER — Encounter (HOSPITAL_BASED_OUTPATIENT_CLINIC_OR_DEPARTMENT_OTHER): Payer: Self-pay | Admitting: Certified Registered"

## 2015-02-14 ENCOUNTER — Encounter (HOSPITAL_BASED_OUTPATIENT_CLINIC_OR_DEPARTMENT_OTHER)
Admission: RE | Disposition: A | Discharge status: Home / Self Care | Payer: Self-pay | Source: Ambulatory Visit | Attending: Surgery

## 2015-02-14 ENCOUNTER — Ambulatory Visit (HOSPITAL_BASED_OUTPATIENT_CLINIC_OR_DEPARTMENT_OTHER)
Admission: RE | Admit: 2015-02-14 | Discharge: 2015-02-14 | Disposition: A | Discharge status: Home / Self Care | Payer: Medicare Other | Source: Ambulatory Visit | Attending: Surgery | Admitting: Surgery

## 2015-02-14 DIAGNOSIS — Z7901 Long term (current) use of anticoagulants: Secondary | ICD-10-CM | POA: Insufficient documentation

## 2015-02-14 DIAGNOSIS — C50512 Malignant neoplasm of lower-outer quadrant of left female breast: Secondary | ICD-10-CM | POA: Insufficient documentation

## 2015-02-14 DIAGNOSIS — Z79899 Other long term (current) drug therapy: Secondary | ICD-10-CM | POA: Insufficient documentation

## 2015-02-14 DIAGNOSIS — N6012 Diffuse cystic mastopathy of left breast: Secondary | ICD-10-CM | POA: Diagnosis not present

## 2015-02-14 DIAGNOSIS — I10 Essential (primary) hypertension: Secondary | ICD-10-CM | POA: Diagnosis not present

## 2015-02-14 DIAGNOSIS — E079 Disorder of thyroid, unspecified: Secondary | ICD-10-CM | POA: Insufficient documentation

## 2015-02-14 DIAGNOSIS — N6092 Unspecified benign mammary dysplasia of left breast: Secondary | ICD-10-CM | POA: Diagnosis not present

## 2015-02-14 DIAGNOSIS — Z803 Family history of malignant neoplasm of breast: Secondary | ICD-10-CM | POA: Insufficient documentation

## 2015-02-14 DIAGNOSIS — K219 Gastro-esophageal reflux disease without esophagitis: Secondary | ICD-10-CM | POA: Insufficient documentation

## 2015-02-14 DIAGNOSIS — M1990 Unspecified osteoarthritis, unspecified site: Secondary | ICD-10-CM | POA: Insufficient documentation

## 2015-02-14 DIAGNOSIS — I4891 Unspecified atrial fibrillation: Secondary | ICD-10-CM | POA: Insufficient documentation

## 2015-02-14 DIAGNOSIS — Z170 Estrogen receptor positive status [ER+]: Secondary | ICD-10-CM | POA: Insufficient documentation

## 2015-02-14 HISTORY — PX: BREAST LUMPECTOMY WITH RADIOACTIVE SEED LOCALIZATION: SHX6424

## 2015-02-14 HISTORY — DX: Presence of spectacles and contact lenses: Z97.3

## 2015-02-14 LAB — POCT HEMOGLOBIN-HEMACUE: HEMOGLOBIN: 9.9 g/dL — AB (ref 12.0–15.0)

## 2015-02-14 SURGERY — BREAST LUMPECTOMY WITH RADIOACTIVE SEED LOCALIZATION
Anesthesia: General | Laterality: Left

## 2015-02-14 MED ORDER — BUPIVACAINE-EPINEPHRINE (PF) 0.5% -1:200000 IJ SOLN
INTRAMUSCULAR | Status: AC
  Filled 2015-02-14: qty 30

## 2015-02-14 MED ORDER — ONDANSETRON HCL 4 MG/2ML IJ SOLN: 4.0000 mg | Freq: Once | INTRAMUSCULAR | Status: DC | PRN

## 2015-02-14 MED ORDER — BUPIVACAINE-EPINEPHRINE (PF) 0.5% -1:200000 IJ SOLN
INTRAMUSCULAR | Status: DC | PRN
  Administered 2015-02-14: 30 mL

## 2015-02-14 MED ORDER — CHLORHEXIDINE GLUCONATE 4 % EX LIQD: 1.0000 "application " | Freq: Once | CUTANEOUS | Status: DC

## 2015-02-14 MED ORDER — PROPOFOL 10 MG/ML IV BOLUS
INTRAVENOUS | Status: DC | PRN
  Administered 2015-02-14: 120 mg via INTRAVENOUS
  Administered 2015-02-14: 50 mg via INTRAVENOUS
  Administered 2015-02-14: 20 mg via INTRAVENOUS
  Administered 2015-02-14: 50 mg via INTRAVENOUS

## 2015-02-14 MED ORDER — FENTANYL CITRATE 0.05 MG/ML IJ SOLN: 25.0000 ug | INTRAMUSCULAR | Status: DC | PRN

## 2015-02-14 MED ORDER — FENTANYL CITRATE 0.05 MG/ML IJ SOLN: 50.0000 ug | INTRAMUSCULAR | Status: DC | PRN

## 2015-02-14 MED ORDER — PHENYLEPHRINE HCL 10 MG/ML IJ SOLN
10.0000 mg | INTRAVENOUS | Status: DC | PRN
  Administered 2015-02-14: 50 ug/min via INTRAVENOUS

## 2015-02-14 MED ORDER — FENTANYL CITRATE 0.05 MG/ML IJ SOLN
INTRAMUSCULAR | Status: DC | PRN
  Administered 2015-02-14 (×2): 50 ug via INTRAVENOUS

## 2015-02-14 MED ORDER — 0.9 % SODIUM CHLORIDE (POUR BTL) OPTIME
TOPICAL | Status: DC | PRN
  Administered 2015-02-14: 700 mL

## 2015-02-14 MED ORDER — MIDAZOLAM HCL 2 MG/2ML IJ SOLN: 1.0000 mg | INTRAMUSCULAR | Status: DC | PRN

## 2015-02-14 MED ORDER — LIDOCAINE HCL (CARDIAC) 20 MG/ML IV SOLN
INTRAVENOUS | Status: DC | PRN
  Administered 2015-02-14: 70 mg via INTRAVENOUS

## 2015-02-14 MED ORDER — LACTATED RINGERS IV SOLN
INTRAVENOUS | Status: DC
  Administered 2015-02-14 (×2): via INTRAVENOUS

## 2015-02-14 MED ORDER — ONDANSETRON HCL 4 MG/2ML IJ SOLN
INTRAMUSCULAR | Status: DC | PRN
  Administered 2015-02-14: 4 mg via INTRAVENOUS

## 2015-02-14 MED ORDER — APIXABAN 5 MG PO TABS: 5.0000 mg | ORAL_TABLET | Freq: Two times a day (BID) | ORAL | Status: DC

## 2015-02-14 MED ORDER — FENTANYL CITRATE 0.05 MG/ML IJ SOLN
INTRAMUSCULAR | Status: AC
  Filled 2015-02-14: qty 4

## 2015-02-14 MED ORDER — HYDROCODONE-ACETAMINOPHEN 5-325 MG PO TABS: 1.0000 | ORAL_TABLET | Freq: Four times a day (QID) | ORAL | Status: DC | PRN

## 2015-02-14 MED ORDER — GLYCOPYRROLATE 0.2 MG/ML IJ SOLN
INTRAMUSCULAR | Status: DC | PRN
  Administered 2015-02-14: 0.2 mg via INTRAVENOUS

## 2015-02-14 SURGICAL SUPPLY — 52 items
BENZOIN TINCTURE PRP APPL 2/3 (GAUZE/BANDAGES/DRESSINGS) IMPLANT
BINDER BREAST LRG (GAUZE/BANDAGES/DRESSINGS) IMPLANT
BINDER BREAST MEDIUM (GAUZE/BANDAGES/DRESSINGS) IMPLANT
BINDER BREAST XLRG (GAUZE/BANDAGES/DRESSINGS) ×2 IMPLANT
BINDER BREAST XXLRG (GAUZE/BANDAGES/DRESSINGS) IMPLANT
BLADE HEX COATED 2.75 (ELECTRODE) IMPLANT
BLADE SURG 10 STRL SS (BLADE) ×2 IMPLANT
BLADE SURG 15 STRL LF DISP TIS (BLADE) ×1 IMPLANT
BLADE SURG 15 STRL SS (BLADE) ×1
CANISTER SUC SOCK COL 7IN (MISCELLANEOUS) IMPLANT
CANISTER SUCT 1200ML W/VALVE (MISCELLANEOUS) ×2 IMPLANT
CHLORAPREP W/TINT 26ML (MISCELLANEOUS) ×2 IMPLANT
CLIP TI WIDE RED SMALL 6 (CLIP) ×2 IMPLANT
COVER BACK TABLE 60X90IN (DRAPES) ×2 IMPLANT
COVER MAYO STAND STRL (DRAPES) ×2 IMPLANT
COVER PROBE W GEL 5X96 (DRAPES) ×2 IMPLANT
DECANTER SPIKE VIAL GLASS SM (MISCELLANEOUS) IMPLANT
DEVICE DUBIN W/COMP PLATE 8390 (MISCELLANEOUS) ×2 IMPLANT
DRAPE LAPAROTOMY 100X72 PEDS (DRAPES) ×2 IMPLANT
DRAPE UTILITY XL STRL (DRAPES) ×2 IMPLANT
DRSG PAD ABDOMINAL 8X10 ST (GAUZE/BANDAGES/DRESSINGS) IMPLANT
ELECT COATED BLADE 2.86 ST (ELECTRODE) ×2 IMPLANT
ELECT REM PT RETURN 9FT ADLT (ELECTROSURGICAL) ×2
ELECTRODE REM PT RTRN 9FT ADLT (ELECTROSURGICAL) ×1 IMPLANT
GLOVE BIOGEL PI IND STRL 7.0 (GLOVE) ×1 IMPLANT
GLOVE BIOGEL PI INDICATOR 7.0 (GLOVE) ×1
GLOVE EXAM NITRILE LRG STRL (GLOVE) ×2 IMPLANT
GLOVE SURG SIGNA 7.5 PF LTX (GLOVE) IMPLANT
GLOVE SURG SS PI 6.5 STRL IVOR (GLOVE) ×2 IMPLANT
GLOVE SURG SS PI 7.5 STRL IVOR (GLOVE) ×2 IMPLANT
GOWN STRL REUS W/ TWL LRG LVL3 (GOWN DISPOSABLE) ×1 IMPLANT
GOWN STRL REUS W/ TWL XL LVL3 (GOWN DISPOSABLE) ×1 IMPLANT
GOWN STRL REUS W/TWL LRG LVL3 (GOWN DISPOSABLE) ×1
GOWN STRL REUS W/TWL XL LVL3 (GOWN DISPOSABLE) ×1
KIT MARKER MARGIN INK (KITS) ×2 IMPLANT
LIQUID BAND (GAUZE/BANDAGES/DRESSINGS) ×2 IMPLANT
NEEDLE HYPO 25X1 1.5 SAFETY (NEEDLE) ×2 IMPLANT
NS IRRIG 1000ML POUR BTL (IV SOLUTION) ×2 IMPLANT
PACK BASIN DAY SURGERY FS (CUSTOM PROCEDURE TRAY) ×2 IMPLANT
PENCIL BUTTON HOLSTER BLD 10FT (ELECTRODE) ×2 IMPLANT
SHEET MEDIUM DRAPE 40X70 STRL (DRAPES) ×2 IMPLANT
SLEEVE SCD COMPRESS KNEE MED (MISCELLANEOUS) ×2 IMPLANT
SPONGE GAUZE 4X4 12PLY STER LF (GAUZE/BANDAGES/DRESSINGS) IMPLANT
SPONGE LAP 18X18 X RAY DECT (DISPOSABLE) ×2 IMPLANT
STRIP CLOSURE SKIN 1/4X4 (GAUZE/BANDAGES/DRESSINGS) IMPLANT
SUT MON AB 5-0 PS2 18 (SUTURE) ×2 IMPLANT
SUT VICRYL 3-0 CR8 SH (SUTURE) ×2 IMPLANT
SYR CONTROL 10ML LL (SYRINGE) ×2 IMPLANT
TOWEL OR 17X24 6PK STRL BLUE (TOWEL DISPOSABLE) ×2 IMPLANT
TOWEL OR NON WOVEN STRL DISP B (DISPOSABLE) ×2 IMPLANT
TUBE CONNECTING 20X1/4 (TUBING) ×2 IMPLANT
YANKAUER SUCT BULB TIP NO VENT (SUCTIONS) ×2 IMPLANT

## 2015-02-14 NOTE — Op Note (Signed)
02/14/2015  10:21 AM  PATIENT:  [name] DOB: 16-Feb-1933 MRN: [id number]  PREOP DIAGNOSIS:  left breast cancer  POSTOP DIAGNOSIS:   left breast cancer, 5 o'clock position (T1, N0)  PROCEDURE:   Procedure(s): BREAST LUMPECTOMY WITH RADIOACTIVE SEED LOCALIZATION  SURGEON:   Alphonsa Overall, M.D.  ANESTHESIA:   general  Anesthesiologist: Napoleon Form, MD CRNA: Laqueta Linden Craft, CRNA  General  EBL:  minimal  ml  DRAINS: none   LOCAL MEDICATIONS USED:   30 cc 1/2% marcaine  SPECIMEN:   Left breast lumpectomy - suture medial  COUNTS CORRECT:  YES  INDICATIONS FOR PROCEDURE:  [name] is a 81 y.o. (DOB: Mar 21, 1933) white  female whose primary care physician is [name], MD and comes for left breast lumpectomy.   Her med oncologist is Dr. Lindi Adie and her rad oncologist is Dr. Valere Dross.  I will do a lumpectomy only (not a SLNBx)   The options for breast cancer treatment have been discussed with the patient. She elected to proceed with lumpectomy.     The indications and potential complications of surgery were explained to the patient. Potential complications include, but are not limited to, bleeding, infection, the need for further surgery, and nerve injury.     She had a I131 seed placed in her left breast at [facility].  I confirmed the presence of the I131 seed in the pre op area using the Neoprobe.  The seed is in the 5 o'clock position of the left breast.  OPERATIVE NOTE:   The patient was taken to room # 8 at [facility] where she underwent a general anesthesia  supervised by Anesthesiologist: Napoleon Form, MD CRNA: Baxter Flattery, CRNA. Her left breast and axilla were prepped with  ChloraPrep and sterilely draped.    A time-out and the surgical check list was reviewed.    I made an incision in the lower outer quadrant of the left breast.  The cancer which was about at the 5 o'clock position of the left breast.   I used the Neoprobe to identify the I131 seed.   I tried to excise an area around the tumor of at least 1 cm.    I excised this block of breast tissue approximately 5 cm by 5 cm  in diameter.  My dissection did not go entirely down to the pectoralis major.  I put a suture in the medial aspect of the specimen.   I painted the lumpectomy specimen with the 6 color paint kit and did a specimen mammogram which confirmed the mass, clip, and the seed were all in the right position in the specimen.  The specimen was sent to pathology who called back to confirm that they have the seed and the specimen.   I then irrigated the wound with saline. I infiltrated approximately 30 mL of 1/2% Marcaine in the incision.  I placed 6 clips to mark biopsy cavity, at 12, 3, 6, and 9 o'clock. Two clips were placed on the pectoralis major.   I then closed all the wounds in layers using 3-0 Vicryl sutures for the deep layer. At the skin, I closed the incisions with a 5-0 Monocryl suture. The incision was then painted with LiquiBand.  She had gauze place over the wound and placed in a breast binder.   The patient tolerated the procedure well, was transported to the recovery room in good condition. Sponge and needle count were correct at the end of  the case.   Final pathology is pending.   Alphonsa Overall, MD, [facility] Pager: [phone] Office phone:  [phone]

## 2015-02-14 NOTE — Anesthesia Postprocedure Evaluation (Signed)
  Anesthesia Post-op Note  Patient: [name]  Procedure(s) Performed: Procedure(s): BREAST LUMPECTOMY WITH RADIOACTIVE SEED LOCALIZATION (Left)  Patient Location: PACU  Anesthesia Type: General   Level of Consciousness: awake, alert  and oriented  Airway and Oxygen Therapy: Patient Spontanous Breathing  Post-op Pain: mild  Post-op Assessment: Post-op Vital signs reviewed  Post-op Vital Signs: Reviewed  Last Vitals:  Filed Vitals:   02/14/15 1100  BP: 138/77  Pulse: 66  Temp:   Resp: 20    Complications: No apparent anesthesia complications

## 2015-02-14 NOTE — Discharge Instructions (Signed)
[name] - DISCHARGE INSTRUCTIONS TO PATIENT  Activity:  Driving - May drive in 2 or 3 days, if doing well   Lifting - Take it easy for 7 days, then no limit  Wound Care:   Leave incision dry for 2 days.  Then remove bandage and shower.  Diet:  As tolerated.  Follow up appointment:  Call Dr. Pollie Friar office [facility]) at [phone] for an appointment in 2 to 3 weeks.  Medications and dosages:  Resume your home medications.  You have a prescription for:  Vicodin  Call Dr. Lucia Gaskins or his office  [phone]) if you have:  Temperature greater than 100.4,  Persistent nausea and vomiting,  Severe uncontrolled pain,  Redness, tenderness, or signs of infection (pain, swelling, redness, odor or green/yellow discharge around the site),  Difficulty breathing, headache or visual disturbances,  Any other questions or concerns you may have after discharge.  In an emergency, call 911 or go to an Emergency Department at a nearby hospital.    Post Anesthesia Home Care Instructions  Activity: Get plenty of rest for the remainder of the day. A responsible adult should stay with you for 24 hours following the procedure.  For the next 24 hours, DO NOT: -Drive a car -Paediatric nurse -Drink alcoholic beverages -Take any medication unless instructed by your physician -Make any legal decisions or sign important papers.  Meals: Start with liquid foods such as gelatin or soup. Progress to regular foods as tolerated. Avoid greasy, spicy, heavy foods. If nausea and/or vomiting occur, drink only clear liquids until the nausea and/or vomiting subsides. Call your physician if vomiting continues.  Special Instructions/Symptoms: Your throat may feel dry or sore from the anesthesia or the breathing tube placed in your throat during surgery. If this causes discomfort, gargle with warm salt water. The discomfort should disappear within 24 hours.

## 2015-02-14 NOTE — Anesthesia Preprocedure Evaluation (Signed)
Anesthesia Evaluation  Patient identified by MRN, date of birth, ID band Patient awake    Reviewed: Allergy & Precautions, NPO status , Patient's Chart, lab work & pertinent test results, reviewed documented beta blocker date and time   Airway Mallampati: I  TM Distance: >3 FB Neck ROM: Full    Dental  (+) Teeth Intact, Dental Advisory Given   Pulmonary  breath sounds clear to auscultation        Cardiovascular hypertension, Pt. on medications and Pt. on home beta blockers Rhythm:Regular Rate:Normal     Neuro/Psych    GI/Hepatic   Endo/Other    Renal/GU      Musculoskeletal   Abdominal   Peds  Hematology   Anesthesia Other Findings   Reproductive/Obstetrics                             Anesthesia Physical Anesthesia Plan  ASA: III  Anesthesia Plan: General   Post-op Pain Management:    Induction: Intravenous  Airway Management Planned: LMA  Additional Equipment:   Intra-op Plan:   Post-operative Plan: Extubation in OR  Informed Consent: I have reviewed the patients History and Physical, chart, labs and discussed the procedure including the risks, benefits and alternatives for the proposed anesthesia with the patient or authorized representative who has indicated his/her understanding and acceptance.   Dental advisory given  Plan Discussed with: CRNA, Surgeon and Anesthesiologist  Anesthesia Plan Comments:         Anesthesia Quick Evaluation

## 2015-02-14 NOTE — H&P (View-Only) (Signed)
[name] 01/22/2015 9:34 AM Location: [facility] Patient #: [id number] DOB: 02/26/1933 Married / Language: English / Race: White Female  History of Present Illness: Patient words: new breast cancer.  The patient is a 81 year old female who presents with breast cancer. Her PCP is Dr. Stoneking. Her husband is with her.  She had a prior right breast biopsy several years ago for benign disease. In the computer, she had a biopsy 09/20/2001 which showed atypical lobular hyperplasia. I cannot find any information on who did the surgery. She is not on hormone therapy.  She has a sister who had breast cancer (she is dead) and a daughter, [name], that has breast cancer (I saw her here, but she went to [facility] to get treated). Her daughter, [name], has Stage IV cancer with mets to her liver. Ms. Vanderpol thinks that she had genetic testing which was negative.  She went for a routine mammogram. She noticed nothing different in her breast. Mammogram at [facility] on 01/13/2015 shows 1.4 cm irregular lesion of left breast at the 5 o'clock position.  She had a left breast biopsy on 01/15/2015 ([id number]) which showed invasive mammary cancer (probable lobular). By report the tumor is ER/PR positive, Ki67 - 15%, Her2Neu - pending. This is not typed yet.  On MRI of breast - 01/20/2015 - this is 1.7 x 1.6 cm in the lower outer quadrant of the left breast.  I discussed the options for breast cancer treatment with the patient. I discussed a multidisciplinary approach to the treatment of breast cancer, which includes medical oncology and radiation oncology. I discussed the surgical options of lumpectomy vs. mastectomy. If mastectomy, there is the possibility of reconstruction. I discussed the options of lymph node biopsy. The treatment plan depends on the pathologic staging of the tumor and the patient's personal wishes. The risks of surgery include, but are not  limited to, bleeding, infection, the need for further surgery, and nerve injury. The patient has been given literature on the treatment of breast cancer. She was presented at the Breast Ca Conference - plan includes genetic counseling, lumpectomy only (no need for lymph node biopsy), and med/rad onc consults.  For med onc she wants Gudena or Magrinat, for rad onc she wants Murray.  Past Med History: 1. Card issues - sees Dr. T. Turner SVT, MVP 2. On chronic Eliquis 3. HTN 4. Hysterectomy in the 1980's for benign disease  Social History: Married Has 3 daughters. Her middle daughter is the one who had breast cancer. I saw her briefly, Tracy Sorinski  Addendum Note(Wynell Halberg H. Sanvi Ehler MD; 01/26/2015 3:35 PM) Dr. Stoneking said we could hold the Eliquis for 3 days - no bridging required. But I would like her off the meds x 5 days. Will need to talk to him. DN 01/24/2015  Addendum Note(Abdoul Encinas H. Danai Gotto MD; 01/30/2015 12:32 PM) Talked directly to Dr. Stoneking. He said that her CHAD score is 2. She is low risk. We can stop the Eliquis 5 days pre op with no bridging. DN 01/30/2015  Other Problems (Kendall Moffitt, RN; 01/22/2015 9:39 AM) Arthritis Atrial Fibrillation Back Pain Gastroesophageal Reflux Disease High blood pressure Lump In Breast Oophorectomy Bilateral. Thyroid Disease Vascular Disease  Past Surgical History (Kendall Moffitt, RN; 01/22/2015 9:39 AM) Breast Biopsy Bilateral. Hysterectomy (not due to cancer) - Complete Shoulder Surgery Left.  Diagnostic Studies History (Kendall Moffitt, RN; 01/22/2015 9:39 AM) Colonoscopy 1-5 years ago Mammogram within last year Pap Smear >5 years ago    Allergies (Kendall Moffitt, RN; 01/22/2015 9:41 AM) Codeine Phosphate *ANALGESICS - OPIOID* Nausea, Vomiting.  Medication History (Kendall Moffitt, RN; 01/22/2015 9:42 AM) Metoprolol Tartrate (25MG Tablet, Oral daily) Active. Oxybutynin Chloride  ER (10MG Tablet ER 24HR, Oral daily) Active. Pantoprazole Sodium (40MG Tablet DR, Oral daily) Active. Levothyroxine Sodium (75MCG Tablet, Oral daily) Active. Eliquis (5MG Tablet, Oral daily) Active.  Social History (Kendall Moffitt, RN; 01/22/2015 9:39 AM) Alcohol use Occasional alcohol use. Caffeine use Carbonated beverages, Coffee, Tea. No drug use Tobacco use Never smoker.  Family History (Kendall Moffitt, RN; 01/22/2015 9:39 AM) Arthritis Brother, Mother. Breast Cancer Daughter, Family Members In General, Sister. Cancer Family Members In General. Cerebrovascular Accident Brother. Depression Sister. Diabetes Mellitus Mother. Heart Disease Brother. Heart disease in female family member before age 55 Hypertension Daughter, Mother, Sister. Migraine Headache Sister. Thyroid problems Daughter.  Pregnancy / Birth History (Kendall Moffitt, RN; 01/22/2015 9:39 AM) Age at menarche 17 years. Age of menopause 46-52 Contraceptive History Oral contraceptives. Gravida 4 Irregular periods Maternal age 26-30 Para 3  Review of Systems (Kendall Moffitt RN; 01/22/2015 9:39 AM) General Not Present- Appetite Loss, Chills, Fatigue, Fever, Night Sweats, Weight Gain and Weight Loss. Skin Not Present- Change in Wart/Mole, Dryness, Hives, Jaundice, New Lesions, Non-Healing Wounds, Rash and Ulcer. HEENT Present- Wears glasses/contact lenses. Not Present- Earache, Hearing Loss, Hoarseness, Nose Bleed, Oral Ulcers, Ringing in the Ears, Seasonal Allergies, Sinus Pain, Sore Throat, Visual Disturbances and Yellow Eyes. Breast Present- Breast Mass. Not Present- Breast Pain, Nipple Discharge and Skin Changes. Cardiovascular Present- Leg Cramps. Not Present- Chest Pain, Difficulty Breathing Lying Down, Palpitations, Rapid Heart Rate, Shortness of Breath and Swelling of Extremities. Gastrointestinal Not Present- Abdominal Pain, Bloating, Bloody Stool, Change in Bowel Habits, Chronic  diarrhea, Constipation, Difficulty Swallowing, Excessive gas, Gets full quickly at meals, Hemorrhoids, Indigestion, Nausea, Rectal Pain and Vomiting. Female Genitourinary Present- Nocturia. Not Present- Frequency, Painful Urination, Pelvic Pain and Urgency. Musculoskeletal Present- Back Pain and Muscle Pain. Not Present- Joint Pain, Joint Stiffness, Muscle Weakness and Swelling of Extremities. Neurological Not Present- Decreased Memory, Fainting, Headaches, Numbness, Seizures, Tingling, Tremor, Trouble walking and Weakness. Psychiatric Present- Anxiety. Not Present- Bipolar, Change in Sleep Pattern, Depression, Fearful and Frequent crying. Endocrine Present- Cold Intolerance. Not Present- Excessive Hunger, Hair Changes, Heat Intolerance, Hot flashes and New Diabetes. Hematology Present- Easy Bruising. Not Present- Excessive bleeding, Gland problems, HIV and Persistent Infections.   Vitals (Kendall Moffitt RN; 01/22/2015 9:40 AM) 01/22/2015 9:40 AM Weight: 162.6 lb Height: 69in Body Surface Area: 1.89 m Body Mass Index: 24.01 kg/m Temp.: 97.5F(Oral)  Pulse: 60 (Regular)  Resp.: 18 (Unlabored)  BP: 136/78 (Sitting, Left Arm, Standard)   Physical Exam: General: WN older WF alert. She is a little forgetful and had a little trouble following our conversation. But between her husband and her, I think she understands things well. I gave her copies of her path and MRI report.  HEENT: Normal. Pupils equal. Good dentition.  Neck: Supple. No mass. No thyroid mass.  Lymph Nodes: No supraclavicular or cervical or axilllary nodes.  Breasts: Right: No mass or nodule. She has a scar at 3 o'clock from a prior biopsy. Left: She has a bruise at 4 o'clock with a mass effect in that area of the breast.  Lungs: Clear to auscultation and symmetric breath sounds. Heart: RRR. No murmur or rub.  Abdomen: Soft. No mass. No tenderness. No hernia. Normal bowel sounds. Well healed  lower midline abdominal scar. Rectal: Not done.  Extremities: Good strength and   ROM in upper and lower extremities.  Neurologic: Grossly intact to motor and sensory function. Psychiatric: Has normal mood and affect. Behavior is normal.    Assessment & Plan: 1.  BREAST CANCER, STAGE 1, LEFT (174.9  C50.912)  Story: She had a left breast biopsy on 01/15/2015 ([id number]) which showed invasive mammary cancer (probable lobular).  On MRI of breast - 01/20/2015 - this is 1.7 x 1.6 cm in the lower outer quadrant of the left breast.  Impression: Medical and radiation oncology consult. (she requests Gudena/Magrinat/Murray)     Hold Eliquis for 5 days.  Genetic consultation.   For left breast lumpectomy with seed localization 02/14/2015  2.  ANTICOAGULATED (V58.61  Z79.01)  Story: On Eliquis - sees Dr. T. Turner  Sora Olivo, MD, FACS [facility] Pager: [phone] Office phone:  [phone]    

## 2015-02-14 NOTE — Interval H&P Note (Signed)
History and Physical Interval Note:  02/14/2015 9:04 AM  [name]  has presented today for surgery, with the diagnosis of left breast cancer  The various methods of treatment have been discussed with the patient and family.  Husband at bedside.  Seed location checked.  One daughter works for the [employer] and they have a Scientist, physiological.  After consideration of risks, benefits and other options for treatment, the patient has consented to  Procedure(s): BREAST LUMPECTOMY WITH RADIOACTIVE SEED LOCALIZATION (Left) as a surgical intervention .  The patient's history has been reviewed, patient examined, no change in status, stable for surgery.  I have reviewed the patient's chart and labs.  Questions were answered to the patient's satisfaction.     Trueman Worlds H

## 2015-02-14 NOTE — Transfer of Care (Signed)
Immediate Anesthesia Transfer of Care Note  Patient: [name]  Procedure(s) Performed: Procedure(s): BREAST LUMPECTOMY WITH RADIOACTIVE SEED LOCALIZATION (Left)  Patient Location: PACU  Anesthesia Type:General  Level of Consciousness: awake, alert  and patient cooperative  Airway & Oxygen Therapy: Patient Spontanous Breathing and Patient connected to face mask oxygen  Post-op Assessment: Report given to RN, Post -op Vital signs reviewed and stable and Patient moving all extremities  Post vital signs: Reviewed and stable  Last Vitals:  Filed Vitals:   02/14/15 0809  BP: 150/73  Pulse: 56  Temp: 36.6 C  Resp: 18    Complications: No apparent anesthesia complications

## 2015-02-14 NOTE — Anesthesia Procedure Notes (Signed)
Procedure Name: LMA Insertion Date/Time: 02/14/2015 9:22 AM Performed by: Baxter Flattery Pre-anesthesia Checklist: Patient identified, Emergency Drugs available, Suction available and Patient being monitored Patient Re-evaluated:Patient Re-evaluated prior to inductionOxygen Delivery Method: Circle System Utilized Preoxygenation: Pre-oxygenation with 100% oxygen Intubation Type: IV induction Ventilation: Mask ventilation without difficulty LMA: LMA inserted LMA Size: 4.0 Number of attempts: 2 Airway Equipment and Method: Bite block Placement Confirmation: positive ETCO2 and breath sounds checked- equal and bilateral Tube secured with: Tape Dental Injury: Teeth and Oropharynx as per pre-operative assessment

## 2015-02-17 ENCOUNTER — Encounter (HOSPITAL_BASED_OUTPATIENT_CLINIC_OR_DEPARTMENT_OTHER): Payer: Self-pay | Admitting: Surgery

## 2015-02-17 ENCOUNTER — Other Ambulatory Visit (HOSPITAL_BASED_OUTPATIENT_CLINIC_OR_DEPARTMENT_OTHER): Payer: Medicare Other

## 2015-02-17 DIAGNOSIS — C50512 Malignant neoplasm of lower-outer quadrant of left female breast: Secondary | ICD-10-CM

## 2015-02-17 LAB — CBC WITH DIFFERENTIAL/PLATELET
BASO%: 0.5 % (ref 0.0–2.0)
BASOS ABS: 0 10*3/uL (ref 0.0–0.1)
EOS ABS: 0.4 10*3/uL (ref 0.0–0.5)
EOS%: 5.7 % (ref 0.0–7.0)
HEMATOCRIT: 34.3 % — AB (ref 34.8–46.6)
HEMOGLOBIN: 11.1 g/dL — AB (ref 11.6–15.9)
LYMPH%: 19.3 % (ref 14.0–49.7)
MCH: 28.3 pg (ref 25.1–34.0)
MCHC: 32.4 g/dL (ref 31.5–36.0)
MCV: 87.6 fL (ref 79.5–101.0)
MONO#: 0.4 10*3/uL (ref 0.1–0.9)
MONO%: 6.6 % (ref 0.0–14.0)
NEUT%: 67.9 % (ref 38.4–76.8)
NEUTROS ABS: 4.5 10*3/uL (ref 1.5–6.5)
PLATELETS: 348 10*3/uL (ref 145–400)
RBC: 3.92 10*6/uL (ref 3.70–5.45)
RDW: 13.5 % (ref 11.2–14.5)
WBC: 6.6 10*3/uL (ref 3.9–10.3)
lymph#: 1.3 10*3/uL (ref 0.9–3.3)

## 2015-02-17 LAB — COMPREHENSIVE METABOLIC PANEL (CC13)
ALBUMIN: 3.3 g/dL — AB (ref 3.5–5.0)
ALT: 13 U/L (ref 0–55)
ANION GAP: 9 meq/L (ref 3–11)
AST: 17 U/L (ref 5–34)
Alkaline Phosphatase: 102 U/L (ref 40–150)
BUN: 15.8 mg/dL (ref 7.0–26.0)
CALCIUM: 8.9 mg/dL (ref 8.4–10.4)
CHLORIDE: 106 meq/L (ref 98–109)
CO2: 25 mEq/L (ref 22–29)
Creatinine: 0.7 mg/dL (ref 0.6–1.1)
EGFR: 76 mL/min/{1.73_m2} — AB (ref >90)
GLUCOSE: 87 mg/dL (ref 70–140)
Potassium: 3.8 mEq/L (ref 3.5–5.1)
Sodium: 140 mEq/L (ref 136–145)
Total Bilirubin: 0.31 mg/dL (ref 0.20–1.20)
Total Protein: 7.4 g/dL (ref 6.4–8.3)

## 2015-02-20 ENCOUNTER — Encounter: Payer: Self-pay | Admitting: Cardiology

## 2015-02-21 ENCOUNTER — Telehealth: Payer: Self-pay | Admitting: Hematology and Oncology

## 2015-02-21 ENCOUNTER — Ambulatory Visit: Payer: Medicare Other | Admitting: Hematology and Oncology

## 2015-02-21 ENCOUNTER — Telehealth: Payer: Self-pay

## 2015-02-21 NOTE — Telephone Encounter (Signed)
Spoke with husband, pt did not have appt on calendar.  Pt not at home at this time.  Let him know I would call again this afternoon to reschedule.

## 2015-02-21 NOTE — Telephone Encounter (Signed)
Ret pt call as she needed to t/s her missed appt from today  [name]

## 2015-02-21 NOTE — Assessment & Plan Note (Signed)
Left breast invasive lobular cancer grade 2, status post lumpectomy 02/14/2015, 1.7 cm tumor T1 cN0 M0 stage IA final pathologic stage, ER 94%, PR 29%, Ki-67 19%, HER-2 negative  Pathology review: I discussed the final pathology report the patient and provided her with a copy of the pathology report. There are no surprises in the pathology report.  Recommendation: 1. Adjuvant radiation therapy followed by 2. Adjuvant antiestrogen therapy  I provided her with a copy of the prescription for anastrozole 1 mg daily. Start this 2 weeks after completion of radiation therapy. I would like to see her back in 4 months to determine tolerance and toxicity to treatment.

## 2015-02-24 ENCOUNTER — Telehealth: Payer: Self-pay | Admitting: Hematology and Oncology

## 2015-02-24 ENCOUNTER — Ambulatory Visit (HOSPITAL_BASED_OUTPATIENT_CLINIC_OR_DEPARTMENT_OTHER): Payer: Medicare Other | Admitting: Hematology and Oncology

## 2015-02-24 DIAGNOSIS — Z170 Estrogen receptor positive status [ER+]: Secondary | ICD-10-CM

## 2015-02-24 DIAGNOSIS — C50512 Malignant neoplasm of lower-outer quadrant of left female breast: Secondary | ICD-10-CM

## 2015-02-24 MED ORDER — ANASTROZOLE 1 MG PO TABS: 1.0000 mg | ORAL_TABLET | Freq: Every day | ORAL | Status: DC

## 2015-02-24 NOTE — Progress Notes (Signed)
Patient Care Team: Lajean Manes, MD as PCP - General (Internal Medicine) Sueanne Margarita, MD as Consulting Physician (Cardiology) Nicholas Lose, MD as Consulting Physician (Hematology and Oncology) Arloa Koh, MD as Consulting Physician (Radiation Oncology)  DIAGNOSIS: No matching staging information was found for the patient.  SUMMARY OF ONCOLOGIC HISTORY:   Breast cancer of lower-outer quadrant of left female breast   01/15/2015 Initial Diagnosis Left breast biopsy: Invasive lobular cancer, grade 2, ER 94%, PR 29%, Ki-67 19%, HER-2 negative   01/21/2015 Breast MRI Left breast:: 1.7 x 1.6 x 1.4 cm irregular enhancing mass lower outer quadrant, no abnormal lymph nodes   02/14/2015 Surgery Left breast lumpectomy: Invasive lobular cancer, negative for LV I, tumor size 01.7 centimeters, margins negative, HER-2 was repeated and it was negative    CHIEF COMPLIANT: status post left breast lumpectomy  INTERVAL HISTORY: [name] is a 81 year old lady with above-mentioned history of left-sided breast cancer treated with lumpectomy and is here today to discuss the final pathology report. She reports still being sore in the breast but otherwise doing very well. She is here to discuss final pathology report  REVIEW OF SYSTEMS:   Constitutional: Denies fevers, chills or abnormal weight loss Eyes: Denies blurriness of vision Ears, nose, mouth, throat, and face: Denies mucositis or sore throat Respiratory: Denies cough, dyspnea or wheezes Cardiovascular: Denies palpitation, chest discomfort or lower extremity swelling Gastrointestinal:  Denies nausea, heartburn or change in bowel habits Skin: Denies abnormal skin rashes Lymphatics: Denies new lymphadenopathy or easy bruising Neurological:Denies numbness, tingling or new weaknesses Behavioral/Psych: Mood is stable, no new changes  Breast: soreness in the breast from recent surgery All other systems were reviewed with the patient and are  negative.  I have reviewed the past medical history, past surgical history, social history and family history with the patient and they are unchanged from previous note.  ALLERGIES:  is allergic to latex and percodan.  MEDICATIONS:  Current Outpatient Prescriptions  Medication Sig Dispense Refill  . apixaban (ELIQUIS) 5 MG TABS tablet Take 1 tablet (5 mg total) by mouth 2 (two) times daily. 60 tablet 1  . levothyroxine (SYNTHROID, LEVOTHROID) 75 MCG tablet Take 75 mcg by mouth daily before breakfast.    . metoprolol succinate (TOPROL-XL) 25 MG 24 hr tablet Take 25 mg by mouth daily.     Marland Kitchen oxybutynin (DITROPAN-XL) 10 MG 24 hr tablet Take 5 mg by mouth 2 (two) times daily.     . pantoprazole (PROTONIX) 40 MG tablet Take 40 mg by mouth daily.     Marland Kitchen anastrozole (ARIMIDEX) 1 MG tablet Take 1 tablet (1 mg total) by mouth daily. 90 tablet 3   No current facility-administered medications for this visit.    PHYSICAL EXAMINATION: ECOG PERFORMANCE STATUS: 1 - Symptomatic but completely ambulatory  Filed Vitals:   02/24/15 1458  BP: 146/72  Pulse: 64  Temp: 97.5 F (36.4 C)  Resp: 18   Filed Weights   02/24/15 1458  Weight: 162 lb 11.2 oz (73.8 kg)    GENERAL:alert, no distress and comfortable SKIN: skin color, texture, turgor are normal, no rashes or significant lesions EYES: normal, Conjunctiva are pink and non-injected, sclera clear OROPHARYNX:no exudate, no erythema and lips, buccal mucosa, and tongue normal  NECK: supple, thyroid normal size, non-tender, without nodularity LYMPH:  no palpable lymphadenopathy in the cervical, axillary or inguinal LUNGS: clear to auscultation and percussion with normal breathing effort HEART: regular rate & rhythm and no murmurs and no  lower extremity edema ABDOMEN:abdomen soft, non-tender and normal bowel sounds Musculoskeletal:no cyanosis of digits and no clubbing  NEURO: alert & oriented x 3 with fluent speech, no focal motor/sensory  deficits  LABORATORY DATA:  I have reviewed the data as listed   Chemistry      Component Value Date/Time   NA 140 02/17/2015 1054   NA 140 02/11/2015 1415   K 3.8 02/17/2015 1054   K 4.1 02/11/2015 1415   CL 103 02/11/2015 1415   CO2 25 02/17/2015 1054   CO2 27 02/11/2015 1415   BUN 15.8 02/17/2015 1054   BUN 14 02/11/2015 1415   CREATININE 0.7 02/17/2015 1054   CREATININE 0.71 02/11/2015 1415      Component Value Date/Time   CALCIUM 8.9 02/17/2015 1054   CALCIUM 9.3 02/11/2015 1415   ALKPHOS 102 02/17/2015 1054   ALKPHOS 80 05/08/2009 1510   AST 17 02/17/2015 1054   AST 22 05/08/2009 1510   ALT 13 02/17/2015 1054   ALT 16 05/08/2009 1510   BILITOT 0.31 02/17/2015 1054   BILITOT 0.5 05/08/2009 1510       Lab Results  Component Value Date   WBC 6.6 02/17/2015   HGB 11.1* 02/17/2015   HCT 34.3* 02/17/2015   MCV 87.6 02/17/2015   PLT 348 02/17/2015   NEUTROABS 4.5 02/17/2015    ASSESSMENT & PLAN:  Breast cancer of lower-outer quadrant of left female breast Left breast biopsy: Invasive lobular cancer, grade 2, ER 94%, PR 29%, Ki-67 19%, HER-2 negative, 1.7 x 1.6 x 1.4 cm by MRI on 01/21/2015, clinical stage TI cN0 M0 stage IA. Final pathology: 1.7 cm invasive lobular cancer, grade 1, ER 94%, PA 29%, Ki-67 19%, HER-2 negative, T1 cN0 M0 stage IA pathologic staging  Pathology and radiology counseling:Discussed with the patient, the details of pathology including the type of breast cancer,the clinical staging, the significance of ER, PR and HER-2/neu receptors and the implications for treatment. Genetic counseling: Patient met with Santiago Glad and did not wish to pursue genetic testing because of concerns regarding the cost of testing.  Recommendation: 1. Adjuvant radiation: patient will be discussing the risks and benefits of adjuvant radiation with Dr. Valere Dross.  2. Adjuvant antiestrogen therapy: If she does not get radiation then she will start an oral antiestrogen therapy  in April 2016. If she does get radiation and she will started after she finishes radiation therapy.  Anastrozole counseling:We discussed the risks and benefits of anti-estrogen therapy with aromatase inhibitors. These include but not limited to insomnia, hot flashes, mood changes, vaginal dryness, bone density loss, and weight gain. Although rare, serious side effects including risk of blood clots were also discussed. We strongly believe that the benefits far outweigh the risks. Patient understands these risks and consented to starting treatment. Planned treatment duration is 5 years.  Return to clinic in 3 months for follow-up      No orders of the defined types were placed in this encounter.   The patient has a good understanding of the overall plan. she agrees with it. She will call with any problems that may develop before her next visit here.   Rulon Eisenmenger, MD

## 2015-02-24 NOTE — Assessment & Plan Note (Signed)
Left breast biopsy: Invasive lobular cancer, grade 2, ER 94%, PR 29%, Ki-67 19%, HER-2 negative, 1.7 x 1.6 x 1.4 cm by MRI on 01/21/2015, clinical stage TI cN0 M0 stage IA. Final pathology: 1.7 cm invasive lobular cancer, grade 1, ER 94%, PA 29%, Ki-67 19%, HER-2 negative, T1 cN0 M0 stage IA pathologic staging  Pathology and radiology counseling:Discussed with the patient, the details of pathology including the type of breast cancer,the clinical staging, the significance of ER, PR and HER-2/neu receptors and the implications for treatment. Genetic counseling: Patient met with Santiago Glad and did not wish to pursue genetic testing because of concerns regarding the cost of testing.  Recommendation: 1. Adjuvant radiation: patient will be discussing the risks and benefits of adjuvant radiation with Dr. Valere Dross.  2. Adjuvant antiestrogen therapy: If she does not get radiation then she will start an oral antiestrogen therapy in April 2016. If she does get radiation and she will started after she finishes radiation therapy.  Anastrozole counseling:We discussed the risks and benefits of anti-estrogen therapy with aromatase inhibitors. These include but not limited to insomnia, hot flashes, mood changes, vaginal dryness, bone density loss, and weight gain. Although rare, serious side effects including risk of blood clots were also discussed. We strongly believe that the benefits far outweigh the risks. Patient understands these risks and consented to starting treatment. Planned treatment duration is 5 years.  Return to clinic in 3 months for follow-up

## 2015-02-24 NOTE — Telephone Encounter (Signed)
GAVE PT AVS REPORT AND APPTS FOR [place]

## 2015-02-25 NOTE — Progress Notes (Signed)
Location of Breast Cancer:Left Breast, Lower Outer Quadrant  Histology per Pathology Report:   02/14/15 Diagnosis Breast, lumpectomy, Left - INVASIVE LOBULAR CARCINOMA, SEE COMMENT. - NEGATIVE FOR LYMPH/VASCULAR INVASION. - TUMOR IS 0.8 CM FROM THE NEAREST MARGIN (ANTERIOR-INFERIOR) - PREVIOUS BIOPSY SITE. - SEE TUMOR SYNOPTIC TEMPLATE BELOW.  Receptor Status: ER(94%), PR (29%), Her2-neu (0.97)  [place]    Past/Anticipated interventions by surgeon, if any:dr. Alphonsa Overall: Left Breast Lumpectomy  Past/Anticipated interventions by medical oncology, if any: Dr. Nicholas Lose: . Adjuvant radiation: patient will be discussing the risks and benefits of adjuvant radiation with Dr. Valere Dross.  2. Adjuvant antiestrogen therapy: If she does not get radiation then she will start an oral antiestrogen therapy in April 2016. If she does get radiation and she will started after she finishes radiation therapy.   Lymphedema issues, if any:  None  Pain issues, if any: Tenderness left breast - level 5 on scale of 0-10  SAFETY ISSUES:  Prior radiation? No  Pacemaker/ICD? No  Possible current pregnancy?No  Is the patient on methotrexate? No  Current Complaints / other details:    Menarche age 72, 59st preg. age 28, G53, P3,, BC x 10 years, Menopause 1982, Hysterectomy,  HRT x 10 years   Astha Probasco, Crista Curb, RN    Chee Dimon, Crista Curb, RN 02/25/2015,5:47 PM

## 2015-02-26 ENCOUNTER — Encounter: Payer: Self-pay | Admitting: Radiation Oncology

## 2015-02-26 ENCOUNTER — Ambulatory Visit
Admission: RE | Admit: 2015-02-26 | Discharge: 2015-02-26 | Disposition: A | Discharge status: Home / Self Care | Payer: Medicare Other | Source: Ambulatory Visit | Attending: Radiation Oncology | Admitting: Radiation Oncology

## 2015-02-26 VITALS — BP 129/64 | HR 60 | Temp 97.6°F | Ht 69.0 in | Wt 161.5 lb

## 2015-02-26 DIAGNOSIS — C50512 Malignant neoplasm of lower-outer quadrant of left female breast: Secondary | ICD-10-CM

## 2015-02-26 NOTE — Progress Notes (Signed)
CC: Dr. Lajean Manes, Dr. Alphonsa Overall, Dr. Nicholas Lose  Follow-up note:  Diagnosis: Pathologic stage I A (T1 N0 M0) invasive lobular carcinoma of the left breast  History: [name] is a pleasant 81 year old female who is seen today for review and discussion of adjuvant therapy in the management of her T1 N0 invasive lobular carcinoma of the left breast.  I first saw the patient in consultation on 02/06/2015. At the time of a screening mammogram at [facility] on February 1 she was felt to have a suspicious lesion at 5:00 within the left breast. Biopsy on 01/15/2015 revealed invasive mammary carcinoma, favoring lobular carcinoma. Her tumor was ER positive at 94%, PR positive at 29% with a Ki-67 of 19%. HER-2/neu was negative. Breast MR on 01/20/2015 showed a 1.7 x 1.6 x 1.4 cm enhancing mass within the lower outer quadrant of the left breast. She was seen by Dr. Alphonsa Overall and by Dr. Lindi Adie for medical oncology consultation.On 02/14/2015 showed underwent a left partial mastectomy.  She was found have a 1.7 cm invasive lobular carcinoma with the closest margin being 0.8 cm.  She was seen by Dr. Lindi Adie who offered her adjuvant antiestrogen therapy.  She is without complaints today.  Physical examination: Alert and oriented. Filed Vitals:   02/26/15 1432  BP: 129/64  Pulse: 60  Temp: 97.6 F (36.4 C)   Nodes: Without palpable cervical, supraclavicular, or axillary lymphadenopathy.  Chest: Lungs clear.  Breasts: There is a partial mastectomy wound along the lower outer quadrant of the left breast extending from 4 to 7:00.  There is the usual degree of underlying induration.  The wound is healing well.  Right breast without masses or lesions.  Extremities: Without edema.  Impression: Pathologic stage I A (T1 N0 M0) invasive lobular carcinoma of the left breast.  We again discussed the role of radiation therapy and antiestrogen therapy for patients over age 69.  Radiation therapy is felt to be  optional provided that she takes 5 years of antiestrogen therapy.  The CALGB over 70 trial showed a local breast recurrence of 2% with tamoxifen along with radiation therapy and a 10% with tamoxifen alone at 12 years.  There is no survival benefit or difference in mastectomy rate with the addition of radiation therapy.  She is a candidate for hypofractionated radiation therapy over 3 one half weeks along with deep inspiration breath-hold technology to avoid cardiac irradiation.  Regardless of whether not she wants to proceed with radiation therapy, I do strongly recommend antiestrogen therapy.  At this point in time she is leaning toward antiestrogen therapy alone, but will call me if she wants to consider radiation therapy.  Plan: As above.  30 minutes was spent face-to-face with the patient, primarily counseling patient.

## 2015-03-03 DIAGNOSIS — R202 Paresthesia of skin: Secondary | ICD-10-CM | POA: Insufficient documentation

## 2015-03-07 ENCOUNTER — Telehealth: Payer: Self-pay | Admitting: *Deleted

## 2015-03-07 ENCOUNTER — Other Ambulatory Visit: Payer: Self-pay | Admitting: *Deleted

## 2015-03-07 DIAGNOSIS — C50512 Malignant neoplasm of lower-outer quadrant of left female breast: Secondary | ICD-10-CM

## 2015-03-07 MED ORDER — ANASTROZOLE 1 MG PO TABS: 1.0000 mg | ORAL_TABLET | Freq: Every day | ORAL | Status: DC

## 2015-03-07 NOTE — Telephone Encounter (Signed)
Spoke with patient today to see if she has made a decision about XRT. She states she does not think she wants to pursue XRT and she is going to contact Dr. Charlton Amor office the 1st of April. Instructed her that her prescription for anastrozole should be at her pharmacy for pick up and to start this the 1st of April if she is not going to be getting XRT.  Confirmed her follow up with Dr. Lindi Adie for 6/21 at 9am. Encouraged her to call with any needs or concerns.

## 2015-03-13 ENCOUNTER — Encounter: Payer: Self-pay | Admitting: Genetic Counselor

## 2015-03-13 ENCOUNTER — Telehealth: Payer: Self-pay | Admitting: Genetic Counselor

## 2015-03-13 DIAGNOSIS — Z1379 Encounter for other screening for genetic and chromosomal anomalies: Secondary | ICD-10-CM | POA: Insufficient documentation

## 2015-03-13 NOTE — Telephone Encounter (Signed)
LM on VM with good news on genetic test results.

## 2015-03-17 ENCOUNTER — Telehealth: Payer: Self-pay | Admitting: Genetic Counselor

## 2015-03-17 ENCOUNTER — Encounter: Payer: Self-pay | Admitting: Genetic Counselor

## 2015-03-17 DIAGNOSIS — C50512 Malignant neoplasm of lower-outer quadrant of left female breast: Secondary | ICD-10-CM

## 2015-03-17 DIAGNOSIS — Z1379 Encounter for other screening for genetic and chromosomal anomalies: Secondary | ICD-10-CM

## 2015-03-17 NOTE — Progress Notes (Signed)
HPI: Ms. Rennert was previously seen in the [facility] due to a personal and family history of cancer and concerns regarding a hereditary predisposition to cancer. Please refer to our prior cancer genetics clinic note for more information regarding Ms. Heinrichs medical, social and family histories, and our assessment and recommendations, at the time. Ms. Gibeault recent genetic test results were disclosed to her, as were recommendations warranted by these results. These results and recommendations are discussed in more detail below.  GENETIC TEST RESULTS: At the time of Ms. Dubuque visit, we recommended she pursue genetic testing of the OvaNext cancer gene panel. The OvaNext gene panel offered by [facility] and includes sequencing and rearrangement analysis for the following 24 genes: ATM, BARD1, BRCA1, BRCA2, BRIP1, CDH1, CHEK2, EPCAM, MLH1, MRE11A, MSH2, MSH6, MUTYH, NBN, NF1, PALB2, PMS2, PTEN, RAD50, RAD51C, RAD51D, SMARCA4, STK11, and TP53.  The report date is March 12, 2015.  Genetic testing was normal, and did not reveal a deleterious mutation in these genes. The test report has been scanned into EPIC and is located under the Media tab. Ms. Janish will receive a copy of her test result via secure email.  We discussed with Ms. Singleton that since the current genetic testing is not perfect, it is possible there may be a gene mutation in one of these genes that current testing cannot detect, but that chance is small. We also discussed, that it is possible that another gene that has not yet been discovered, or that we have not yet tested, is responsible for the cancer diagnoses in the family, and it is, therefore, important to remain in touch with cancer genetics in the future so that we can continue to offer Ms. Rossetti the most up to date genetic testing.   Genetic testing did detect a Variant of Unknown Significance in the ATM gene called [id number]. At this time, it is unknown  if this variant is associated with increased cancer risk or if this is a normal finding, but most variants such as this get reclassified to being inconsequential. It should not be used to make medical management decisions. With time, we suspect the lab will determine the significance of this variant, if any. If we do learn more about it, we will try to contact Ms. Bui to discuss it further. However, it is important to stay in touch with [place] periodically and keep the address and phone number up to date.  CANCER SCREENING RECOMMENDATIONS: This result is reassuring and suggests that Ms. Abid personal and family history of cancer was most likely not due to an inherited predisposition associated with one of these genes. Most cancers happen by chance and this negative test, along with details of her family history, suggests that her cancer falls into this category. We, therefore, recommended she continue to follow the cancer management and screening guidelines provided by her oncology and primary providers.   RECOMMENDATIONS FOR FAMILY MEMBERS: Women in this family might be at some increased risk of developing cancer, over the general population risk, simply due to the family history of cancer. We recommended women in this family have a yearly mammogram beginning at age 53, or 85 years younger than the earliest onset of cancer, an an annual clinical breast exam, and perform monthly breast self-exams. Women in this family should also have a gynecological exam as recommended by their primary provider. All family members should have a colonoscopy by age 19.  FOLLOW-UP: Lastly, we discussed with Ms. Weaver that  cancer genetics is a rapidly advancing field and it is possible that new genetic tests will be appropriate for her and/or her family members in the future. We encouraged her to remain in contact with cancer genetics on an annual basis so we can update her personal and family histories and let her know  of advances in cancer genetics that may benefit this family.   Our contact number was provided. Ms. Court questions were answered to her satisfaction, and she knows she is welcome to call us at anytime with additional questions or concerns.   Roma Kayser, MS, [facility] Certified Genetic Counselor [email or web address]

## 2015-03-17 NOTE — Telephone Encounter (Signed)
Revealed negative genetic testing results on breast/ovarian cancer panel.  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

## 2015-04-03 ENCOUNTER — Telehealth: Payer: Self-pay

## 2015-04-03 NOTE — Telephone Encounter (Signed)
LMOVM - pt to have started anastrazole.  Call clinic with any questions.

## 2015-04-03 NOTE — Telephone Encounter (Signed)
LMOVM - pt to have started anti-estrogen.  Call clinic with questions.

## 2015-04-15 ENCOUNTER — Telehealth: Payer: Self-pay | Admitting: Adult Health

## 2015-04-15 NOTE — Telephone Encounter (Signed)
I spoke with [name] to check on her now that she has completed her anti-cancer treatments and begun therapy with anastrazole to let her know that she is eligible for the Survivorship Clinic.   [name] reports that she feels very well, has no side effects from any of the treatments (including the anastrazole), and does not feel like she needs a visit in survivorship.  I offered to mail her a copy of her [place] and she said, "I just don't think I need any of that. I'm doing so well."  I encouraged her to keep up the good work with her physical activity (she goes to the gym at least 3 days a week and is very active with family).    I wished [name] continued wellness and encouraged her to contact us if she needed [place] in survivorship in the future.  She expressed appreciation for my call.  Based on the pt's wishes, I will not attempt to contact her with further survivorship appts.   Mike Craze, NP [place] [phone]

## 2015-04-16 ENCOUNTER — Other Ambulatory Visit (HOSPITAL_COMMUNITY): Payer: Self-pay | Admitting: Dermatology

## 2015-05-01 ENCOUNTER — Telehealth: Payer: Self-pay | Admitting: *Deleted

## 2015-05-01 ENCOUNTER — Other Ambulatory Visit: Payer: Self-pay | Admitting: *Deleted

## 2015-05-01 DIAGNOSIS — C50512 Malignant neoplasm of lower-outer quadrant of left female breast: Secondary | ICD-10-CM

## 2015-05-01 MED ORDER — ANASTROZOLE 1 MG PO TABS: 1.0000 mg | ORAL_TABLET | Freq: Every day | ORAL | Status: DC

## 2015-05-01 NOTE — Telephone Encounter (Signed)
Anastrozole rx called in to [facility].

## 2015-05-01 NOTE — Telephone Encounter (Signed)
TC from patient this am. She is on Anastrozole, states she is doing well with it but wants to have next refills through [facility]. She will need refill completed in 1 week.

## 2015-05-01 NOTE — Telephone Encounter (Signed)
Anastrozole called in to [facility]

## 2015-06-03 ENCOUNTER — Telehealth: Payer: Self-pay | Admitting: Hematology and Oncology

## 2015-06-03 ENCOUNTER — Ambulatory Visit (HOSPITAL_BASED_OUTPATIENT_CLINIC_OR_DEPARTMENT_OTHER): Payer: Medicare Other | Admitting: Hematology and Oncology

## 2015-06-03 VITALS — BP 131/71 | HR 61 | Temp 97.6°F | Resp 18 | Ht 69.0 in | Wt 164.0 lb

## 2015-06-03 DIAGNOSIS — C50512 Malignant neoplasm of lower-outer quadrant of left female breast: Secondary | ICD-10-CM

## 2015-06-03 NOTE — Progress Notes (Signed)
Patient Care Team: Lajean Manes, MD as PCP - General (Internal Medicine) Sueanne Margarita, MD as Consulting Physician (Cardiology) Nicholas Lose, MD as Consulting Physician (Hematology and Oncology) Arloa Koh, MD as Consulting Physician (Radiation Oncology)  DIAGNOSIS: No matching staging information was found for the patient.  SUMMARY OF ONCOLOGIC HISTORY:   Breast cancer of lower-outer quadrant of left female breast   01/15/2015 Initial Diagnosis Left breast biopsy: Invasive lobular cancer, grade 2, ER 94%, PR 29%, Ki-67 19%, HER-2 negative   01/21/2015 Breast MRI Left breast:: 1.7 x 1.6 x 1.4 cm irregular enhancing mass lower outer quadrant, no abnormal lymph nodes   02/14/2015 Surgery Left breast lumpectomy: Invasive lobular cancer, negative for LV I, tumor size 01.7 centimeters, margins negative, HER-2 was repeated and it was negative   04/29/2015 -  Anti-estrogen oral therapy Anastrozole 1 mg daily 5 years    CHIEF COMPLIANT: Follow-up on anastrozole  INTERVAL HISTORY: [name] is a 81 year old with above-mentioned history of left breast cancer treated with lumpectomy, did not need radiation and is now on oral antiestrogen therapy with anastrozole 1 mg daily. She is tolerating extremely well. She does not have any hot flashes. In fact she feels warm that has been beneficial because she normally feels cold related to anticoagulation.  REVIEW OF SYSTEMS:   Constitutional: Denies fevers, chills or abnormal weight loss Eyes: Denies blurriness of vision Ears, nose, mouth, throat, and face: Denies mucositis or sore throat Respiratory: Denies cough, dyspnea or wheezes Cardiovascular: Denies palpitation, chest discomfort or lower extremity swelling Gastrointestinal:  Denies nausea, heartburn or change in bowel habits Skin: Denies abnormal skin rashes Lymphatics: Denies new lymphadenopathy or easy bruising Neurological:Denies numbness, tingling or new weaknesses Behavioral/Psych:  Occasional anxiety  Breast:  denies any pain or lumps or nodules in either breasts All other systems were reviewed with the patient and are negative.  I have reviewed the past medical history, past surgical history, social history and family history with the patient and they are unchanged from previous note.  ALLERGIES:  is allergic to latex and percodan.  MEDICATIONS:  Current Outpatient Prescriptions  Medication Sig Dispense Refill  . anastrozole (ARIMIDEX) 1 MG tablet Take 1 tablet (1 mg total) by mouth daily. 90 tablet 3  . apixaban (ELIQUIS) 5 MG TABS tablet Take 1 tablet (5 mg total) by mouth 2 (two) times daily. 60 tablet 1  . Ergocalciferol (VITAMIN D2 PO) Take by mouth. 1.25 mg weekly    . levothyroxine (SYNTHROID, LEVOTHROID) 75 MCG tablet Take 75 mcg by mouth daily before breakfast.    . metoprolol succinate (TOPROL-XL) 25 MG 24 hr tablet Take 25 mg by mouth daily.     Marland Kitchen oxybutynin (DITROPAN-XL) 10 MG 24 hr tablet Take 5 mg by mouth 2 (two) times daily.     . pantoprazole (PROTONIX) 40 MG tablet Take 40 mg by mouth daily.      No current facility-administered medications for this visit.    PHYSICAL EXAMINATION: ECOG PERFORMANCE STATUS: 1 - Symptomatic but completely ambulatory  Filed Vitals:   06/03/15 0851  BP: 131/71  Pulse: 61  Temp: 97.6 F (36.4 C)  Resp: 18   Filed Weights   06/03/15 0851  Weight: 164 lb (74.39 kg)    GENERAL:alert, no distress and comfortable SKIN: skin color, texture, turgor are normal, no rashes or significant lesions EYES: normal, Conjunctiva are pink and non-injected, sclera clear OROPHARYNX:no exudate, no erythema and lips, buccal mucosa, and tongue normal  NECK: supple,  thyroid normal size, non-tender, without nodularity LYMPH:  no palpable lymphadenopathy in the cervical, axillary or inguinal LUNGS: clear to auscultation and percussion with normal breathing effort HEART: regular rate & rhythm and no murmurs and no lower extremity  edema ABDOMEN:abdomen soft, non-tender and normal bowel sounds Musculoskeletal:no cyanosis of digits and no clubbing  NEURO: alert & oriented x 3 with fluent speech, no focal motor/sensory deficits  LABORATORY DATA:  I have reviewed the data as listed   Chemistry      Component Value Date/Time   NA 140 02/17/2015 1054   NA 140 02/11/2015 1415   K 3.8 02/17/2015 1054   K 4.1 02/11/2015 1415   CL 103 02/11/2015 1415   CO2 25 02/17/2015 1054   CO2 27 02/11/2015 1415   BUN 15.8 02/17/2015 1054   BUN 14 02/11/2015 1415   CREATININE 0.7 02/17/2015 1054   CREATININE 0.71 02/11/2015 1415      Component Value Date/Time   CALCIUM 8.9 02/17/2015 1054   CALCIUM 9.3 02/11/2015 1415   ALKPHOS 102 02/17/2015 1054   ALKPHOS 80 05/08/2009 1510   AST 17 02/17/2015 1054   AST 22 05/08/2009 1510   ALT 13 02/17/2015 1054   ALT 16 05/08/2009 1510   BILITOT 0.31 02/17/2015 1054   BILITOT 0.5 05/08/2009 1510       Lab Results  Component Value Date   WBC 6.6 02/17/2015   HGB 11.1* 02/17/2015   HCT 34.3* 02/17/2015   MCV 87.6 02/17/2015   PLT 348 02/17/2015   NEUTROABS 4.5 02/17/2015   ASSESSMENT & PLAN:  Breast cancer of lower-outer quadrant of left female breast Left breast biopsy: Invasive lobular cancer, grade 2, ER 94%, PR 29%, Ki-67 19%, HER-2 negative, 1.7 x 1.6 x 1.4 cm by MRI on 01/21/2015, clinical stage TI cN0 M0 stage IA. Final pathology: 1.7 cm invasive lobular cancer, grade 1, ER 94%, PA 29%, Ki-67 19%, HER-2 negative, T1 cN0 M0 stage IA pathologic staging Current treatment: Anastrozole 1 mg daily started 04/27/2015 5 years  Anastrozole toxicities: Denies any hot flashes or myalgias. Occasionally she gets slight anxiety. Patient is tolerating it extremely well. In fact she feels warm which helps her because she normally feels cold related to anticoagulation.   Return to clinic in 6 months for follow-up to start surveillance breast exams.      No orders of the defined  types were placed in this encounter.   The patient has a good understanding of the overall plan. she agrees with it. she will call with any problems that may develop before the next visit here.   Rulon Eisenmenger, MD

## 2015-06-03 NOTE — Telephone Encounter (Signed)
Gave avs & calendar for January. Patient request January do to traveling.

## 2015-06-03 NOTE — Assessment & Plan Note (Signed)
Left breast biopsy: Invasive lobular cancer, grade 2, ER 94%, PR 29%, Ki-67 19%, HER-2 negative, 1.7 x 1.6 x 1.4 cm by MRI on 01/21/2015, clinical stage TI cN0 M0 stage IA. Final pathology: 1.7 cm invasive lobular cancer, grade 1, ER 94%, PA 29%, Ki-67 19%, HER-2 negative, T1 cN0 M0 stage IA pathologic staging Current treatment: Anastrozole 1 mg daily started 04/27/2015 5 years  Anastrozole toxicities: Denies any hot flashes or myalgias. Occasionally she gets slight anxiety. Patient is tolerating it extremely well. In fact she feels warm which helps her because she normally feels cold related to anticoagulation.   Return to clinic in 6 months for follow-up to start surveillance breast exams.

## 2015-06-27 ENCOUNTER — Telehealth: Payer: Self-pay | Admitting: Adult Health

## 2015-06-27 NOTE — Telephone Encounter (Signed)
Chestine Spore, NP spoke with Ms. Occhipinti to attempt to schedule her survivorship visit.  She tells [place] that she is doing well and currently doesn't see a need for a formal visit in survivorship.  We offered to mail her a copy of her survivorship care plan, in lieu of an in-person visit and she agreed to this plan.    We will mail her a copy of her care plan per her request.  We encouraged her to call us with any questions or concerns.    Mike Craze, NP [place] [phone]

## 2015-07-24 ENCOUNTER — Telehealth: Payer: Self-pay | Admitting: *Deleted

## 2015-07-24 NOTE — Telephone Encounter (Signed)
TC from patient stating she is having a side effect from her Anastrozole-specifically-dry mouth, significantly diminished taste and food sticking to her teeth because of the lack of saliva.  Asked patient if she has been using Biotene and increasing her fluid intake. She says she has but it has not helped much.  She states its 'driving her crazy, not being able to taste her food.' Please advise.

## 2015-07-24 NOTE — Telephone Encounter (Signed)
Returned call to patient and spoke with her husband. Gave him the recommendation for pt to try coconut oil to help moisturize mouth. Advised that she can try a tsp or so and swish it around in her and spit it out or swallow. The coconut oil should last longer than the Biotene.  If this is not helpful, advised husband to have pt call back to this office. He verbalized understanding.

## 2015-08-01 ENCOUNTER — Telehealth: Payer: Self-pay | Admitting: Hematology and Oncology

## 2015-08-01 NOTE — Telephone Encounter (Signed)
VM from patient reporting continued dry mouth related to medications.  Returned call to patient with no answer, instructed to call back.

## 2015-08-05 ENCOUNTER — Telehealth: Payer: Self-pay | Admitting: *Deleted

## 2015-08-05 ENCOUNTER — Telehealth: Payer: Self-pay

## 2015-08-05 NOTE — Telephone Encounter (Signed)
THIS MESSAGE FORWARD TO DR.GUDENA'S NURSE, Lonell Grandchild

## 2015-08-05 NOTE — Telephone Encounter (Signed)
Let pt know to hold anastrazole.  Clinic will follow up in one month to assess.

## 2015-08-22 ENCOUNTER — Encounter: Payer: Self-pay | Admitting: Nurse Practitioner

## 2015-08-22 NOTE — Progress Notes (Signed)
The Survivorship Care Plan was mailed to Ms. Eager as she reported not being able to come in to the Survivorship Clinic for an in-person visit at this time. A letter was mailed to her outlining the purpose of the content of the care plan, as well as encouraging her to reach out to me with any questions or concerns.  My business card was included in the correspondence to the patient as well.  A copy of the care plan was also routed/faxed/mailed to [name], MD, the patient's PCP.  I will not be placing any follow-up appointments to the Survivorship Clinic for Ms. Colcord, but I am happy to see her at any time in the future for any survivorship concerns that may arise. Thank you for allowing me to participate in her care!  Kenn File, [place] [phone]

## 2015-09-16 ENCOUNTER — Telehealth: Payer: Self-pay

## 2015-09-17 ENCOUNTER — Telehealth: Payer: Self-pay | Admitting: Hematology and Oncology

## 2015-09-17 ENCOUNTER — Telehealth: Payer: Self-pay

## 2015-09-17 NOTE — Telephone Encounter (Signed)
Added appt per pof per pof pt ok and aware

## 2015-09-17 NOTE — Telephone Encounter (Signed)
Pt reports symptoms she was experiencing have improved since stopping anastrazole.  Per Dr. Lindi Adie, pt needs to come in for med evaluation.  Appt made for Friday at 1215.  pof sent.

## 2015-09-17 NOTE — Telephone Encounter (Signed)
error 

## 2015-09-19 ENCOUNTER — Ambulatory Visit (HOSPITAL_BASED_OUTPATIENT_CLINIC_OR_DEPARTMENT_OTHER): Payer: Medicare Other | Admitting: Hematology and Oncology

## 2015-09-19 ENCOUNTER — Telehealth: Payer: Self-pay | Admitting: Hematology and Oncology

## 2015-09-19 ENCOUNTER — Encounter: Payer: Self-pay | Admitting: Hematology and Oncology

## 2015-09-19 DIAGNOSIS — C50512 Malignant neoplasm of lower-outer quadrant of left female breast: Secondary | ICD-10-CM

## 2015-09-19 MED ORDER — LETROZOLE 2.5 MG PO TABS: 2.5000 mg | ORAL_TABLET | Freq: Every day | ORAL | Status: DC

## 2015-09-19 NOTE — Assessment & Plan Note (Signed)
Left breast biopsy: Invasive lobular cancer, grade 2, ER 94%, PR 29%, Ki-67 19%, HER-2 negative, 1.7 x 1.6 x 1.4 cm by MRI on 01/21/2015, clinical stage TI cN0 M0 stage IA. Final pathology: 1.7 cm invasive lobular cancer, grade 1, ER 94%, PA 29%, Ki-67 19%, HER-2 negative, T1 cN0 M0 stage IA pathologic staging Current treatment: Anastrozole 1 mg daily started 04/27/2015 5 years  Anastrozole toxicities: Denies any hot flashes or myalgias. Occasionally she gets slight anxiety. Patient is tolerating it extremely well. In fact she feels warm which helps her because she normally feels cold related to anticoagulation.   Breast Cancer Surveillance: 1. Breast exam 09/19/2015: Normal 2. Mammograms will be due January 2017  Return to clinic in 6 months for follow-up to start surveillance breast exams.

## 2015-09-19 NOTE — Progress Notes (Signed)
Patient Care Team: Lajean Manes, MD as PCP - General (Internal Medicine) Sueanne Margarita, MD as Consulting Physician (Cardiology) Nicholas Lose, MD as Consulting Physician (Hematology and Oncology) Arloa Koh, MD as Consulting Physician (Radiation Oncology) Sylvan Cheese, NP as Nurse Practitioner (Nurse Practitioner) Alphonsa Overall, MD as Consulting Physician (General Surgery)  DIAGNOSIS: Breast cancer of lower-outer quadrant of left female breast [facility])   Staging form: Breast, AJCC 7th Edition     Pathologic stage from 02/14/2015: Stage IA (T1c, N0, cM0) - Unsigned     Clinical: Stage IA (T1c, N0, M0) - Unsigned   SUMMARY OF ONCOLOGIC HISTORY:   Breast cancer of lower-outer quadrant of left female breast ([place])   01/15/2015 Initial Biopsy Left breast biopsy: Invasive lobular cancer, grade 2, ER+ (94%), PR+ (29%),  HER-2 negative, Ki-67 19%.   01/21/2015 Breast MRI Left breast:: 1.7 x 1.6 x 1.4 cm irregular enhancing mass lower outer quadrant, no abnormal lymph nodes   01/21/2015 Clinical Stage Stage IA: TI cN0 M0.   02/14/2015 Surgery Left breast lumpectomy ([facility]): Invasive lobular cancer, negative for LV I, tumor size 01.7 cm, margins negative, HER-2 was repeated and it was negative   02/14/2015 Pathologic Stage Stage IA: T1c N0   02/26/2015 Miscellaneous Pt opted for no RT.   03/12/2015 Procedure Genetic testing: OvaNext gene panel  ([employer]) revealed VUS at ATM. Otherwise negative BARD1, BRCA1, BRCA2, BRIP1, CDH1, CHEK2, EPCAM, MLH1, MRE11A, MSH2, MSH6, MUTYH, NBN, NF1, PALB2, PMS2, PTEN, RAD50, RAD51C, RAD51D, SMARCA4, STK11, TP53.   04/29/2015 -  Anti-estrogen oral therapy Anastrozole 1 mg daily changed to letrozole 2.5 mg daily 09/19/2015 due to dry mouth and loss of taste   08/22/2015 Survivorship A copy of the survivorship care plan was mailed to the patient in lieu of an in-person visit at her request.    CHIEF COMPLIANT: inability to tolerate anastrozole  INTERVAL HISTORY:  [name] is a 81 year old with above-mentioned history of left breast invasive lobular cancer currently on hormone therapy with anastrozole. She could not tolerate it because of dry mouth as well as loss of taste. Since she stopped anastrozole her taste buds have recovered her dry mouth is also improved. Otherwise she is doing quite well without any lumps or nodules in the breast. She had mild hot flashes.  REVIEW OF SYSTEMS:   Constitutional: Denies fevers, chills or abnormal weight loss Eyes: Denies blurriness of vision Ears, nose, mouth, throat, and face: Denies mucositis or sore throat Respiratory: Denies cough, dyspnea or wheezes Cardiovascular: Denies palpitation, chest discomfort or lower extremity swelling Gastrointestinal:  Denies nausea, heartburn or change in bowel habits Skin: Denies abnormal skin rashes Lymphatics: Denies new lymphadenopathy or easy bruising Neurological:Denies numbness, tingling or new weaknesses Behavioral/Psych: Mood is stable, no new changes  Breast:  denies any pain or lumps or nodules in either breasts All other systems were reviewed with the patient and are negative.  I have reviewed the past medical history, past surgical history, social history and family history with the patient and they are unchanged from previous note.  ALLERGIES:  is allergic to latex and percodan.  MEDICATIONS:  Current Outpatient Prescriptions  Medication Sig Dispense Refill  . apixaban (ELIQUIS) 5 MG TABS tablet Take 1 tablet (5 mg total) by mouth 2 (two) times daily. 60 tablet 1  . Ergocalciferol (VITAMIN D2 PO) Take by mouth. 1.25 mg weekly    . letrozole (FEMARA) 2.5 MG tablet Take 1 tablet (2.5 mg total) by mouth daily. [place]  tablet 3  . levothyroxine (SYNTHROID, LEVOTHROID) 75 MCG tablet Take 75 mcg by mouth daily before breakfast.    . metoprolol succinate (TOPROL-XL) 25 MG 24 hr tablet Take 25 mg by mouth daily.     Marland Kitchen oxybutynin (DITROPAN-XL) 10 MG 24 hr tablet Take  5 mg by mouth 2 (two) times daily.     . pantoprazole (PROTONIX) 40 MG tablet Take 40 mg by mouth daily.      No current facility-administered medications for this visit.    PHYSICAL EXAMINATION: ECOG PERFORMANCE STATUS: 1 - Symptomatic but completely ambulatory  There were no vitals filed for this visit. There were no vitals filed for this visit.  GENERAL:alert, no distress and comfortable SKIN: skin color, texture, turgor are normal, no rashes or significant lesions EYES: normal, Conjunctiva are pink and non-injected, sclera clear OROPHARYNX:no exudate, no erythema and lips, buccal mucosa, and tongue normal  NECK: supple, thyroid normal size, non-tender, without nodularity LYMPH:  no palpable lymphadenopathy in the cervical, axillary or inguinal LUNGS: clear to auscultation and percussion with normal breathing effort HEART: regular rate & rhythm and no murmurs and no lower extremity edema ABDOMEN:abdomen soft, non-tender and normal bowel sounds Musculoskeletal:no cyanosis of digits and no clubbing  NEURO: alert & oriented x 3 with fluent speech, no focal motor/sensory deficits  LABORATORY DATA:  I have reviewed the data as listed   Chemistry      Component Value Date/Time   NA 140 02/17/2015 1054   NA 140 02/11/2015 1415   K 3.8 02/17/2015 1054   K 4.1 02/11/2015 1415   CL 103 02/11/2015 1415   CO2 25 02/17/2015 1054   CO2 27 02/11/2015 1415   BUN 15.8 02/17/2015 1054   BUN 14 02/11/2015 1415   CREATININE 0.7 02/17/2015 1054   CREATININE 0.71 02/11/2015 1415      Component Value Date/Time   CALCIUM 8.9 02/17/2015 1054   CALCIUM 9.3 02/11/2015 1415   ALKPHOS 102 02/17/2015 1054   ALKPHOS 80 05/08/2009 1510   AST 17 02/17/2015 1054   AST 22 05/08/2009 1510   ALT 13 02/17/2015 1054   ALT 16 05/08/2009 1510   BILITOT 0.31 02/17/2015 1054   BILITOT 0.5 05/08/2009 1510       Lab Results  Component Value Date   WBC 6.6 02/17/2015   HGB 11.1* 02/17/2015   HCT  34.3* 02/17/2015   MCV 87.6 02/17/2015   PLT 348 02/17/2015   NEUTROABS 4.5 02/17/2015   ASSESSMENT & PLAN:  Breast cancer of lower-outer quadrant of left female breast Left breast biopsy: Invasive lobular cancer, grade 2, ER 94%, PR 29%, Ki-67 19%, HER-2 negative, 1.7 x 1.6 x 1.4 cm by MRI on 01/21/2015, clinical stage TI cN0 M0 stage IA. Final pathology: 1.7 cm invasive lobular cancer, grade 1, ER 94%, PA 29%, Ki-67 19%, HER-2 negative, T1 cN0 M0 stage IA pathologic staging Current treatment: Anastrozole 1 mg daily started 04/27/2015 stopped August 2016 due to dry mouth and lack of taste; change in treatment to letrozole 2.5 mg daily from 09/21/2015  Breast Cancer Surveillance: 1. Breast exam every 4-6 months 2. Mammograms will be due January 2017  Return to clinic in 1 month or toxicity check on letrozole  No orders of the defined types were placed in this encounter.   The patient has a good understanding of the overall plan. she agrees with it. she will call with any problems that may develop before the next visit here.   Lindi Adie,  Tyler Deis, MD

## 2015-09-19 NOTE — Telephone Encounter (Signed)
Appointments made and avs printed for pateint °

## 2015-09-29 ENCOUNTER — Telehealth: Payer: Self-pay | Admitting: Hematology and Oncology

## 2015-09-29 NOTE — Telephone Encounter (Signed)
Patient called in to reschedule her appointment °

## 2015-10-20 ENCOUNTER — Ambulatory Visit: Payer: Medicare Other | Admitting: Hematology and Oncology

## 2015-10-28 NOTE — Assessment & Plan Note (Signed)
Left breast biopsy: Invasive lobular cancer, grade 2, ER 94%, PR 29%, Ki-67 19%, HER-2 negative, 1.7 x 1.6 x 1.4 cm by MRI on 01/21/2015, clinical stage TI cN0 M0 stage IA. Final pathology: 1.7 cm invasive lobular cancer, grade 1, ER 94%, PA 29%, Ki-67 19%, HER-2 negative, T1 cN0 M0 stage IA pathologic staging Current treatment: Anastrozole 1 mg daily started 04/27/2015 stopped August 2016 due to dry mouth and lack of taste; change in treatment to letrozole 2.5 mg daily from 09/21/2015  Letrozole toxicities:  Breast Cancer Surveillance: 1. Breast exam every 4-6 months 2. Mammograms will be due January 2017  Return to clinic in 3 months for follow-up

## 2015-10-29 ENCOUNTER — Encounter: Payer: Self-pay | Admitting: Hematology and Oncology

## 2015-10-29 ENCOUNTER — Telehealth: Payer: Self-pay | Admitting: Hematology and Oncology

## 2015-10-29 ENCOUNTER — Ambulatory Visit (HOSPITAL_BASED_OUTPATIENT_CLINIC_OR_DEPARTMENT_OTHER): Payer: Medicare Other | Admitting: Hematology and Oncology

## 2015-10-29 VITALS — BP 156/73 | HR 62 | Temp 97.4°F | Resp 18 | Ht 69.0 in | Wt 162.3 lb

## 2015-10-29 DIAGNOSIS — C50512 Malignant neoplasm of lower-outer quadrant of left female breast: Secondary | ICD-10-CM

## 2015-10-29 NOTE — Progress Notes (Signed)
Patient Care Team: Lajean Manes, MD as PCP - General (Internal Medicine) Sueanne Margarita, MD as Consulting Physician (Cardiology) Nicholas Lose, MD as Consulting Physician (Hematology and Oncology) Arloa Koh, MD as Consulting Physician (Radiation Oncology) Sylvan Cheese, NP as Nurse Practitioner (Nurse Practitioner) Alphonsa Overall, MD as Consulting Physician (General Surgery)  DIAGNOSIS: Breast cancer of lower-outer quadrant of left female breast [facility])   Staging form: Breast, AJCC 7th Edition     Pathologic stage from 02/14/2015: Stage IA (T1c, N0, cM0) - Unsigned     Clinical: Stage IA (T1c, N0, M0) - Unsigned   SUMMARY OF ONCOLOGIC HISTORY:   Breast cancer of lower-outer quadrant of left female breast ([place])   01/15/2015 Initial Biopsy Left breast biopsy: Invasive lobular cancer, grade 2, ER+ (94%), PR+ (29%),  HER-2 negative, Ki-67 19%.   01/21/2015 Breast MRI Left breast:: 1.7 x 1.6 x 1.4 cm irregular enhancing mass lower outer quadrant, no abnormal lymph nodes   01/21/2015 Clinical Stage Stage IA: TI cN0 M0.   02/14/2015 Surgery Left breast lumpectomy ([facility]): Invasive lobular cancer, negative for LV I, tumor size 01.7 cm, margins negative, HER-2 was repeated and it was negative   02/14/2015 Pathologic Stage Stage IA: T1c N0   02/26/2015 Miscellaneous Pt opted for no RT.   03/12/2015 Procedure Genetic testing: OvaNext gene panel  ([employer]) revealed VUS at ATM. Otherwise negative BARD1, BRCA1, BRCA2, BRIP1, CDH1, CHEK2, EPCAM, MLH1, MRE11A, MSH2, MSH6, MUTYH, NBN, NF1, PALB2, PMS2, PTEN, RAD50, RAD51C, RAD51D, SMARCA4, STK11, TP53.   04/29/2015 -  Anti-estrogen oral therapy Anastrozole 1 mg daily changed to letrozole 2.5 mg daily 09/19/2015 due to dry mouth and loss of taste   08/22/2015 Survivorship A copy of the survivorship care plan was mailed to the patient in lieu of an in-person visit at her request.    CHIEF COMPLIANT: Follow-up on letrozole  INTERVAL HISTORY: [name] is a 81 year old with above-mentioned history of left breast cancer currently on hormonal therapy with letrozole. She could not tolerate anastrozole and so we switched her to letrozole. She had dry mouth as well as lack of taste anastrozole. The symptoms are not as profound on letrozole. She denies any other new symptoms or concerns.  REVIEW OF SYSTEMS:   Constitutional: Denies fevers, chills or abnormal weight loss Eyes: Denies blurriness of vision Ears, nose, mouth, throat, and face: Denies mucositis or sore throat Respiratory: Denies cough, dyspnea or wheezes Cardiovascular: Denies palpitation, chest discomfort or lower extremity swelling Gastrointestinal:  Denies nausea, heartburn or change in bowel habits Skin: Denies abnormal skin rashes Lymphatics: Denies new lymphadenopathy or easy bruising Neurological:Denies numbness, tingling or new weaknesses Behavioral/Psych: Mood is stable, no new changes  Breast:  denies any pain or lumps or nodules in either breasts All other systems were reviewed with the patient and are negative.  I have reviewed the past medical history, past surgical history, social history and family history with the patient and they are unchanged from previous note.  ALLERGIES:  is allergic to latex and percodan.  MEDICATIONS:  Current Outpatient Prescriptions  Medication Sig Dispense Refill  . apixaban (ELIQUIS) 5 MG TABS tablet Take 1 tablet (5 mg total) by mouth 2 (two) times daily. 60 tablet 1  . Ergocalciferol (VITAMIN D2 PO) Take by mouth. 1.25 mg weekly    . letrozole (FEMARA) 2.5 MG tablet Take 1 tablet (2.5 mg total) by mouth daily. 90 tablet 3  . levothyroxine (SYNTHROID, LEVOTHROID) 75 MCG tablet Take 75  mcg by mouth daily before breakfast.    . metoprolol succinate (TOPROL-XL) 25 MG 24 hr tablet Take 25 mg by mouth daily.     Marland Kitchen oxybutynin (DITROPAN-XL) 10 MG 24 hr tablet Take 5 mg by mouth 2 (two) times daily.     . pantoprazole (PROTONIX) 40 MG  tablet Take 40 mg by mouth daily.      No current facility-administered medications for this visit.    PHYSICAL EXAMINATION: ECOG PERFORMANCE STATUS: 1 - Symptomatic but completely ambulatory  Filed Vitals:   10/29/15 1004  BP: 156/73  Pulse: 62  Temp: 97.4 F (36.3 C)  Resp: 18   Filed Weights   10/29/15 1004  Weight: 162 lb 4.8 oz (73.619 kg)    GENERAL:alert, no distress and comfortable SKIN: skin color, texture, turgor are normal, no rashes or significant lesions EYES: normal, Conjunctiva are pink and non-injected, sclera clear OROPHARYNX:no exudate, no erythema and lips, buccal mucosa, and tongue normal  NECK: supple, thyroid normal size, non-tender, without nodularity LYMPH:  no palpable lymphadenopathy in the cervical, axillary or inguinal LUNGS: clear to auscultation and percussion with normal breathing effort HEART: regular rate & rhythm and no murmurs and no lower extremity edema ABDOMEN:abdomen soft, non-tender and normal bowel sounds Musculoskeletal:no cyanosis of digits and no clubbing  NEURO: alert & oriented x 3 with fluent speech, no focal motor/sensory deficits BREAST: No palpable masses or nodules in either right or left breasts. No palpable axillary supraclavicular or infraclavicular adenopathy no breast tenderness or nipple discharge. (exam performed in the presence of a chaperone)  LABORATORY DATA:  I have reviewed the data as listed   Chemistry      Component Value Date/Time   NA 140 02/17/2015 1054   NA 140 02/11/2015 1415   K 3.8 02/17/2015 1054   K 4.1 02/11/2015 1415   CL 103 02/11/2015 1415   CO2 25 02/17/2015 1054   CO2 27 02/11/2015 1415   BUN 15.8 02/17/2015 1054   BUN 14 02/11/2015 1415   CREATININE 0.7 02/17/2015 1054   CREATININE 0.71 02/11/2015 1415      Component Value Date/Time   CALCIUM 8.9 02/17/2015 1054   CALCIUM 9.3 02/11/2015 1415   ALKPHOS 102 02/17/2015 1054   ALKPHOS 80 05/08/2009 1510   AST 17 02/17/2015 1054   AST  22 05/08/2009 1510   ALT 13 02/17/2015 1054   ALT 16 05/08/2009 1510   BILITOT 0.31 02/17/2015 1054   BILITOT 0.5 05/08/2009 1510       Lab Results  Component Value Date   WBC 6.6 02/17/2015   HGB 11.1* 02/17/2015   HCT 34.3* 02/17/2015   MCV 87.6 02/17/2015   PLT 348 02/17/2015   NEUTROABS 4.5 02/17/2015   ASSESSMENT & PLAN:  Breast cancer of lower-outer quadrant of left female breast Left breast biopsy: Invasive lobular cancer, grade 2, ER 94%, PR 29%, Ki-67 19%, HER-2 negative, 1.7 x 1.6 x 1.4 cm by MRI on 01/21/2015, clinical stage TI cN0 M0 stage IA. Final pathology: 1.7 cm invasive lobular cancer, grade 1, ER 94%, PA 29%, Ki-67 19%, HER-2 negative, T1 cN0 M0 stage IA pathologic staging Current treatment: Anastrozole 1 mg daily started 04/27/2015 stopped August 2016 due to dry mouth and lack of taste; change in treatment to letrozole 2.5 mg daily from 09/21/2015  Letrozole toxicities: 1. Dry mouth and lack of taste is slightly better Patient appears to think that letrozole as much better tolerable than anastrozole. I discussed with her extensively  about drinking more water and staying more hydrated.  Breast Cancer Surveillance: 1. Breast exam every 4-6 months 2. Mammograms will be due January 2017  Return to clinic in 6 months for follow-up   No orders of the defined types were placed in this encounter.   The patient has a good understanding of the overall plan. she agrees with it. she will call with any problems that may develop before the next visit here.   Rulon Eisenmenger, MD 10/29/2015

## 2015-10-29 NOTE — Addendum Note (Signed)
Addended by: Prentiss Bells on: 10/29/2015 05:27 PM   Modules accepted: Medications

## 2015-10-29 NOTE — Telephone Encounter (Signed)
Appointments made and avs printed for patient °

## 2015-11-28 ENCOUNTER — Other Ambulatory Visit (INDEPENDENT_AMBULATORY_CARE_PROVIDER_SITE_OTHER): Payer: Medicare Other

## 2015-11-28 ENCOUNTER — Ambulatory Visit (INDEPENDENT_AMBULATORY_CARE_PROVIDER_SITE_OTHER): Payer: Medicare Other | Admitting: Neurology

## 2015-11-28 ENCOUNTER — Encounter: Payer: Self-pay | Admitting: Neurology

## 2015-11-28 VITALS — BP 148/84 | HR 64 | Ht 68.0 in | Wt 162.0 lb

## 2015-11-28 DIAGNOSIS — G609 Hereditary and idiopathic neuropathy, unspecified: Secondary | ICD-10-CM | POA: Diagnosis not present

## 2015-11-28 DIAGNOSIS — R292 Abnormal reflex: Secondary | ICD-10-CM | POA: Diagnosis not present

## 2015-11-28 DIAGNOSIS — R739 Hyperglycemia, unspecified: Secondary | ICD-10-CM

## 2015-11-28 DIAGNOSIS — Z853 Personal history of malignant neoplasm of breast: Secondary | ICD-10-CM | POA: Diagnosis not present

## 2015-11-28 DIAGNOSIS — R270 Ataxia, unspecified: Secondary | ICD-10-CM | POA: Diagnosis not present

## 2015-11-28 LAB — VITAMIN B12: VITAMIN B 12: 1226 pg/mL — AB (ref 211–911)

## 2015-11-28 LAB — FOLATE: Folate: 16.1 ng/mL (ref >5.9)

## 2015-11-28 LAB — HEMOGLOBIN A1C: Hgb A1c MFr Bld: 5.8 % (ref 4.6–6.5)

## 2015-11-28 NOTE — Patient Instructions (Addendum)
1. Your provider has requested that you have labwork completed today. Please go to [facility] ([id number]) on the second floor of this building before leaving the office today. You do not need to check in. If you are not called within 15 minutes please check with the front desk.  2. We have scheduled you at [facility] for your MRI on 12/16/2015 at 3:00 pm. Please arrive 15 minutes prior and go to 1st floor radiology. If you need to reschedule for any reason please call [phone].

## 2015-11-28 NOTE — Progress Notes (Signed)
[name] was seen today in neurologic consultation at the request of [name], MD.  The consultation is for the evaluation of unsteady gait for the last several weeks to months.  She states that she has 2 episodes where when she would start to stand and walk, she would have what sounds like start hesitation.  She has only had 2 discrete episodes. The episode could last up to 30 min. She was not dizzy.  She felt like a baby that didn't know how to walk and afraid she would fall if she tried to walk.  She states that she just cannot describe an episode to me.  She had no speech trouble.  She had no lateralizing weakness or paresthesias.  She states that she wonders if it is in her ears.  She told me that she almost cancelled the consult but she decided to come and make sure that nothing was wrong.  She states that about a year ago she was evaluated for neuropathy and had an EMG and was told that she did not have neuropathy.  I do not see that evaluation in her Epic chart.      ALLERGIES:   Allergies  Allergen Reactions  . Codeine Other (See Comments)  . Latex Itching  . Percodan [Oxycodone-Aspirin] Nausea And Vomiting    Nausea     CURRENT MEDICATIONS:  Outpatient Encounter Prescriptions as of 11/28/2015  Medication Sig  . apixaban (ELIQUIS) 5 MG TABS tablet Take 1 tablet (5 mg total) by mouth 2 (two) times daily.  . Ergocalciferol (VITAMIN D2 PO) Take by mouth. 1.25 mg weekly  . letrozole (FEMARA) 2.5 MG tablet Take 2.5 mg by mouth daily.   Marland Kitchen levothyroxine (SYNTHROID, LEVOTHROID) 75 MCG tablet Take 75 mcg by mouth daily before breakfast.  . metoprolol succinate (TOPROL-XL) 25 MG 24 hr tablet Take 25 mg by mouth daily.   Marland Kitchen oxybutynin (DITROPAN-XL) 10 MG 24 hr tablet Take 5 mg by mouth 2 (two) times daily.   . pantoprazole (PROTONIX) 40 MG tablet Take 40 mg by mouth daily.   . [DISCONTINUED] letrozole (FEMARA) 2.5 MG tablet Take 1 tablet (2.5 mg total) by mouth daily.   No  facility-administered encounter medications on file as of 11/28/2015.    PAST MEDICAL HISTORY:   Past Medical History  Diagnosis Date  . Hypertension   . SVT (supraventricular tachycardia) ([place]) 06/2010    s/p AVNRT ablation  . MVP (mitral valve prolapse)   . Migraine headache   . Allergic rhinitis   . Hematuria     negative workup - Dr. Reece Agar  . Anemia   . PAF (paroxysmal atrial fibrillation) ([place])   . Chronic venous insufficiency   . Breast cancer, left breast ([place]) 01/15/15    Invasive Mammary  . Wears glasses     PAST SURGICAL HISTORY:   Past Surgical History  Procedure Laterality Date  . Left breast biopsy  01/15/15  . Breast surgery  1990    rt br bx  . Abdominal hysterectomy    . Tubal ligation    . Dilation and curettage of uterus    . Colonoscopy    . Cardiac catheterization  2011    ablasion  . Breast lumpectomy with radioactive seed localization Left 02/14/2015    Procedure: BREAST LUMPECTOMY WITH RADIOACTIVE SEED LOCALIZATION;  Surgeon: Alphonsa Overall, MD;  Location: [facility];  Service: General;  Laterality: Left;    SOCIAL HISTORY:   Social History  Social History  . Marital Status: Married    Spouse Name: N/A  . Number of Children: 3  . Years of Education: N/A   Occupational History  . Not on file.   Social History Main Topics  . Smoking status: Never Smoker   . Smokeless tobacco: Not on file  . Alcohol Use: 0.0 oz/week    0 Standard drinks or equivalent per week     Comment: once a week  . Drug Use: No  . Sexual Activity: Not on file   Other Topics Concern  . Not on file   Social History Narrative    FAMILY HISTORY:   Family Status  Relation Status Death Age  . Mother Deceased     HTN, arthritis, heart disease  . Father Deceased     hardening of the arteries  . Sister Alive     pacemaker  . Brother Deceased     heart attack  . Maternal Aunt    . Maternal Uncle    . Paternal Uncle Deceased   . Sister Deceased       alzheimers, PD  . Daughter Alive     ROS:  A complete 10 system review of systems was obtained and was unremarkable apart from what is mentioned above.  PHYSICAL EXAMINATION:    VITALS:   Filed Vitals:   11/28/15 0941  BP: 148/84  Pulse: 64  Height: 5\' 8"  (1.727 m)  Weight: 162 lb (73.483 kg)    GEN:  Normal appears female in no acute distress.  Appears stated age. HEENT:  Normocephalic, atraumatic. The mucous membranes are moist. The superficial temporal arteries are without ropiness or tenderness. Cardiovascular: Regular rate and rhythm. Lungs: Clear to auscultation bilaterally. Neck/Heme: There are no carotid bruits noted bilaterally.  NEUROLOGICAL: Orientation:  The patient is alert and oriented x 3.  Fund of knowledge is appropriate.  Recent and remote memory intact.  Attention span and concentration normal.  Repeats and names without difficulty. Cranial nerves: There is good facial symmetry. The pupils are equal round and reactive to light bilaterally. Fundoscopic exam reveals clear disc margins bilaterally. Extraocular muscles are intact and visual fields are full to confrontational testing. Speech is fluent and clear. Soft palate rises symmetrically and there is no tongue deviation. Hearing is intact to conversational tone. Tone: Tone is good throughout. Sensation: Sensation is intact to light touch and pinprick throughout (facial, trunk, extremities). Vibration is decreased at the bilateral big toe. There is no extinction with double simultaneous stimulation. There is no sensory dermatomal level identified. Coordination:  The patient has no difficulty with RAM's or FNF bilaterally. Motor: Strength is 5/5 in the bilateral upper and lower extremities.  Shoulder shrug is equal and symmetric. There is no pronator drift.  There are no fasciculations noted. DTR's: Deep tendon reflexes are 3/4 at the bilateral biceps, triceps, brachioradialis, patella and 1/4 at the bilateral  achilles.  Plantar responses are downgoing bilaterally. Gait and Station: The patient is able to ambulate without difficulty.  She is wide-based.  The patient is able to heel toe walk without any difficulty.  There is some difficulty ambulating in a tandem fashion.  The patient is able to stand in the Romberg position.  No results found for: VITAMINB12   IMPRESSION/PLAN  1.  Rare episodes of what sounds like start hesitation  -I was not able to reproduce this in the office today.  She did have a wide-based gait and some evidence of peripheral neuropathy on  examination.  She reports that she had an EMG done a year ago, although I cannot find this within her Epic chart.  I will try to get a copy from her primary care physician.  Regardless, I did not necessarily recommend that we repeat this right now.  I did recommend that we do some lab work including B12, folate, RPR, SPEP/UPEP with immunofixation, hemoglobin A1c (history of hyperglycemia).  -She is somewhat hyperreflexic, so we decided to go ahead and do an MRI of the brain.  -I told her we will call her with the results of the above.  If negative, we can decide whether or not we wanted to repeat the EMG.  She was agreeable with this.

## 2015-11-29 LAB — RPR

## 2015-12-02 LAB — SPEP & IFE WITH QIG
Albumin ELP: 4.3 g/dL (ref 3.8–4.8)
Alpha-1-Globulin: 0.3 g/dL (ref 0.2–0.3)
Alpha-2-Globulin: 0.7 g/dL (ref 0.5–0.9)
BETA 2: 0.4 g/dL (ref 0.2–0.5)
Beta Globulin: 0.4 g/dL (ref 0.4–0.6)
Gamma Globulin: 1.4 g/dL (ref 0.8–1.7)
IGA: 189 mg/dL (ref 69–380)
IGG (IMMUNOGLOBIN G), SERUM: 1380 mg/dL (ref 690–1700)
IGM, SERUM: 77 mg/dL (ref 52–322)
Total Protein, Serum Electrophoresis: 7.4 g/dL (ref 6.1–8.1)

## 2015-12-02 LAB — UIFE/LIGHT CHAINS/TP QN, 24-HR UR
ALBUMIN, U: DETECTED
Total Protein, Urine: <4 mg/dL — ABNORMAL LOW (ref 5–24)

## 2015-12-16 ENCOUNTER — Ambulatory Visit (HOSPITAL_COMMUNITY)
Admission: RE | Admit: 2015-12-16 | Discharge: 2015-12-16 | Disposition: A | Discharge status: Home / Self Care | Payer: Medicare Other | Source: Ambulatory Visit | Attending: Neurology | Admitting: Neurology

## 2015-12-16 DIAGNOSIS — I739 Peripheral vascular disease, unspecified: Secondary | ICD-10-CM | POA: Insufficient documentation

## 2015-12-16 DIAGNOSIS — G319 Degenerative disease of nervous system, unspecified: Secondary | ICD-10-CM | POA: Insufficient documentation

## 2015-12-16 DIAGNOSIS — R270 Ataxia, unspecified: Secondary | ICD-10-CM | POA: Insufficient documentation

## 2015-12-16 DIAGNOSIS — Z853 Personal history of malignant neoplasm of breast: Secondary | ICD-10-CM

## 2015-12-16 DIAGNOSIS — R292 Abnormal reflex: Secondary | ICD-10-CM | POA: Diagnosis not present

## 2015-12-16 LAB — CREATININE, SERUM
Creatinine, Ser: 0.59 mg/dL (ref 0.44–1.00)
GFR calc non Af Amer: >60 mL/min (ref >60)

## 2015-12-16 MED ORDER — GADOBENATE DIMEGLUMINE 529 MG/ML IV SOLN
15.0000 mL | Freq: Once | INTRAVENOUS | Status: AC | PRN
  Administered 2015-12-16: 15 mL via INTRAVENOUS

## 2015-12-17 ENCOUNTER — Telehealth: Payer: Self-pay | Admitting: Neurology

## 2015-12-17 NOTE — Telephone Encounter (Signed)
Patient made aware of MR results. She is not interested in proceeding with EMG. She will call if she changes her mind.

## 2015-12-17 NOTE — Telephone Encounter (Signed)
-----   Message from [place], DO sent at 12/17/2015  7:44 AM EST ----- Reviewed.  Agree.  Moderate small vessel disease.  [name], let pt know that there is some hardening of arteries in brain, not unusual for her age and medical issues (HTN) but nothing unusual and no strokes, tumors, etc

## 2015-12-18 ENCOUNTER — Telehealth: Payer: Self-pay | Admitting: Neurology

## 2015-12-18 NOTE — Telephone Encounter (Signed)
Not unless she needs [place] in the future

## 2015-12-18 NOTE — Telephone Encounter (Signed)
LMOM making patient aware she does not need to follow up with [place].

## 2015-12-18 NOTE — Telephone Encounter (Signed)
Patient did not want EMG. Do you need to see her in follow up?

## 2015-12-18 NOTE — Telephone Encounter (Signed)
Pt wants to talk to someone about if she was to come back in please call [phone]

## 2015-12-23 ENCOUNTER — Ambulatory Visit: Payer: Medicare Other | Admitting: Hematology and Oncology

## 2016-04-20 ENCOUNTER — Telehealth: Payer: Self-pay | Admitting: Hematology and Oncology

## 2016-04-20 NOTE — Telephone Encounter (Signed)
returned call and s.w. pt and r/s appt to earlier time on 5.11 per pt request....pt ok and aware

## 2016-04-22 ENCOUNTER — Telehealth: Payer: Self-pay | Admitting: Hematology and Oncology

## 2016-04-22 ENCOUNTER — Ambulatory Visit (HOSPITAL_BASED_OUTPATIENT_CLINIC_OR_DEPARTMENT_OTHER): Payer: Medicare Other | Admitting: Hematology and Oncology

## 2016-04-22 ENCOUNTER — Ambulatory Visit: Payer: Medicare Other | Admitting: Hematology and Oncology

## 2016-04-22 ENCOUNTER — Encounter: Payer: Self-pay | Admitting: Hematology and Oncology

## 2016-04-22 VITALS — BP 168/84 | HR 55 | Temp 98.3°F | Resp 18 | Ht 68.0 in | Wt 165.3 lb

## 2016-04-22 DIAGNOSIS — C50512 Malignant neoplasm of lower-outer quadrant of left female breast: Secondary | ICD-10-CM | POA: Diagnosis not present

## 2016-04-22 DIAGNOSIS — Z79811 Long term (current) use of aromatase inhibitors: Secondary | ICD-10-CM | POA: Diagnosis not present

## 2016-04-22 NOTE — Progress Notes (Signed)
Patient Care Team: Lajean Manes, MD as PCP - General (Internal Medicine) Sueanne Margarita, MD as Consulting Physician (Cardiology) Nicholas Lose, MD as Consulting Physician (Hematology and Oncology) Arloa Koh, MD as Consulting Physician (Radiation Oncology) Sylvan Cheese, NP as Nurse Practitioner (Nurse Practitioner) Alphonsa Overall, MD as Consulting Physician (General Surgery)  DIAGNOSIS: Breast cancer of lower-outer quadrant of left female breast [facility])   Staging form: Breast, AJCC 7th Edition     Pathologic stage from 02/14/2015: Stage IA (T1c, N0, cM0) - Unsigned     Clinical: Stage IA (T1c, N0, M0) - Unsigned   SUMMARY OF ONCOLOGIC HISTORY:   Breast cancer of lower-outer quadrant of left female breast ([place])   01/15/2015 Initial Biopsy Left breast biopsy: Invasive lobular cancer, grade 2, ER+ (94%), PR+ (29%),  HER-2 negative, Ki-67 19%.   01/21/2015 Breast MRI Left breast:: 1.7 x 1.6 x 1.4 cm irregular enhancing mass lower outer quadrant, no abnormal lymph nodes   01/21/2015 Clinical Stage Stage IA: TI cN0 M0.   02/14/2015 Surgery Left breast lumpectomy ([facility]): Invasive lobular cancer, negative for LV I, tumor size 01.7 cm, margins negative, HER-2 was repeated and it was negative   02/14/2015 Pathologic Stage Stage IA: T1c N0   02/26/2015 Miscellaneous Pt opted for no RT.   03/12/2015 Procedure Genetic testing: OvaNext gene panel  ([employer]) revealed VUS at ATM. Otherwise negative BARD1, BRCA1, BRCA2, BRIP1, CDH1, CHEK2, EPCAM, MLH1, MRE11A, MSH2, MSH6, MUTYH, NBN, NF1, PALB2, PMS2, PTEN, RAD50, RAD51C, RAD51D, SMARCA4, STK11, TP53.   04/29/2015 -  Anti-estrogen oral therapy Anastrozole 1 mg daily changed to letrozole 2.5 mg daily 09/19/2015 due to dry mouth and loss of taste   08/22/2015 Survivorship A copy of the survivorship care plan was mailed to the patient in lieu of an in-person visit at her request.    CHIEF COMPLIANT: Congestion/upper respiratory infection  INTERVAL  HISTORY: GURTHA PICKER is a 82 year old with above-mentioned history left breast cancer currently on antiestrogen therapy with letrozole. She does that the dry mouth and lack of basal continuing to bother her. She is wondering if she can discontinue antiestrogen treatment. She has been on it for about a year. She had traveled to [place] in had a wonderful trip to [place] area. Her husband just finished treatment for lymphoma.  REVIEW OF SYSTEMS:   Constitutional: Denies fevers, chills or abnormal weight loss Eyes: Denies blurriness of vision Ears, nose, mouth, throat, and face: Lack of taste and dry mouth Respiratory: Upper congestion Cardiovascular: Denies palpitation, chest discomfort Gastrointestinal:  Denies nausea, heartburn or change in bowel habits Skin: Denies abnormal skin rashes Lymphatics: Denies new lymphadenopathy or easy bruising Neurological:Denies numbness, tingling or new weaknesses Behavioral/Psych: Mood is stable, no new changes  Extremities: No lower extremity edema Breast:  denies any pain or lumps or nodules in either breasts All other systems were reviewed with the patient and are negative.  I have reviewed the past medical history, past surgical history, social history and family history with the patient and they are unchanged from previous note.  ALLERGIES:  is allergic to codeine; latex; and percodan.  MEDICATIONS:  Current Outpatient Prescriptions  Medication Sig Dispense Refill  . apixaban (ELIQUIS) 5 MG TABS tablet Take 1 tablet (5 mg total) by mouth 2 (two) times daily. 60 tablet 1  . Ergocalciferol (VITAMIN D2 PO) Take by mouth. 1.25 mg weekly    . letrozole (FEMARA) 2.5 MG tablet Take 2.5 mg by mouth daily.     Marland Kitchen  levothyroxine (SYNTHROID, LEVOTHROID) 75 MCG tablet Take 75 mcg by mouth daily before breakfast.    . metoprolol succinate (TOPROL-XL) 25 MG 24 hr tablet Take 25 mg by mouth daily.     Marland Kitchen oxybutynin (DITROPAN-XL) 10 MG 24 hr tablet Take 5  mg by mouth 2 (two) times daily.     . pantoprazole (PROTONIX) 40 MG tablet Take 40 mg by mouth daily.      No current facility-administered medications for this visit.    PHYSICAL EXAMINATION: ECOG PERFORMANCE STATUS: 1 - Symptomatic but completely ambulatory  Filed Vitals:   04/22/16 0900  BP: 168/84  Pulse: 55  Temp: 98.3 F (36.8 C)  Resp: 18   Filed Weights   04/22/16 0900  Weight: 165 lb 4.8 oz (74.98 kg)    GENERAL:alert, no distress and comfortable SKIN: skin color, texture, turgor are normal, no rashes or significant lesions EYES: normal, Conjunctiva are pink and non-injected, sclera clear OROPHARYNX:no exudate, no erythema and lips, buccal mucosa, and tongue normal  NECK: supple, thyroid normal size, non-tender, without nodularity LYMPH:  no palpable lymphadenopathy in the cervical, axillary or inguinal LUNGS: clear to auscultation and percussion with normal breathing effort HEART: regular rate & rhythm and no murmurs and no lower extremity edema ABDOMEN:abdomen soft, non-tender and normal bowel sounds MUSCULOSKELETAL:no cyanosis of digits and no clubbing  NEURO: alert & oriented x 3 with fluent speech, no focal motor/sensory deficits EXTREMITIES: No lower extremity edema BREAST: No palpable masses or nodules in either right or left breasts. No palpable axillary supraclavicular or infraclavicular adenopathy no breast tenderness or nipple discharge. (exam performed in the presence of a chaperone)  LABORATORY DATA:  I have reviewed the data as listed   Chemistry      Component Value Date/Time   NA 140 02/17/2015 1054   NA 140 02/11/2015 1415   K 3.8 02/17/2015 1054   K 4.1 02/11/2015 1415   CL 103 02/11/2015 1415   CO2 25 02/17/2015 1054   CO2 27 02/11/2015 1415   BUN 15.8 02/17/2015 1054   BUN 14 02/11/2015 1415   CREATININE 0.59 12/16/2015 1500   CREATININE 0.7 02/17/2015 1054      Component Value Date/Time   CALCIUM 8.9 02/17/2015 1054   CALCIUM 9.3  02/11/2015 1415   ALKPHOS 102 02/17/2015 1054   ALKPHOS 80 05/08/2009 1510   AST 17 02/17/2015 1054   AST 22 05/08/2009 1510   ALT 13 02/17/2015 1054   ALT 16 05/08/2009 1510   BILITOT 0.31 02/17/2015 1054   BILITOT 0.5 05/08/2009 1510       Lab Results  Component Value Date   WBC 6.6 02/17/2015   HGB 11.1* 02/17/2015   HCT 34.3* 02/17/2015   MCV 87.6 02/17/2015   PLT 348 02/17/2015   NEUTROABS 4.5 02/17/2015     ASSESSMENT & PLAN:  Breast cancer of lower-outer quadrant of left female breast eft breast biopsy: Invasive lobular cancer, grade 2, ER 94%, PR 29%, Ki-67 19%, HER-2 negative, 1.7 x 1.6 x 1.4 cm by MRI on 01/21/2015, clinical stage TI cN0 M0 stage IA. Final pathology: 1.7 cm invasive lobular cancer, grade 1, ER 94%, PA 29%, Ki-67 19%, HER-2 negative, T1 cN0 M0 stage IA pathologic staging Current treatment: Anastrozole 1 mg daily started 04/27/2015 stopped August 2016 due to dry mouth and lack of taste; change in treatment to letrozole 2.5 mg daily from 09/21/2015  Letrozole toxicities: 1. Dry mouth and lack of taste Continues to be a major  problem for her. She would like to discontinue letrozole for 3 months to see if it makes it any better. If it is related to letrozole, she plans to not take any further antiestrogen therapy. I also discussed whether she would like to take tamoxifen. But she is not interested in switching to another medication. We will make a final decision on all of this in 3 months when she comes back.  Breast Cancer Surveillance: 1. Breast exam 04/22/2016 is without any clinical concerns for disease recurrence 2. Mammograms January 2017  Return to clinic in 3 months for follow-up   No orders of the defined types were placed in this encounter.   The patient has a good understanding of the overall plan. she agrees with it. she will call with any problems that may develop before the next visit here.   Rulon Eisenmenger, MD 04/22/2016

## 2016-04-22 NOTE — Assessment & Plan Note (Signed)
eft breast biopsy: Invasive lobular cancer, grade 2, ER 94%, PR 29%, Ki-67 19%, HER-2 negative, 1.7 x 1.6 x 1.4 cm by MRI on 01/21/2015, clinical stage TI cN0 M0 stage IA. Final pathology: 1.7 cm invasive lobular cancer, grade 1, ER 94%, PA 29%, Ki-67 19%, HER-2 negative, T1 cN0 M0 stage IA pathologic staging Current treatment: Anastrozole 1 mg daily started 04/27/2015 stopped August 2016 due to dry mouth and lack of taste; change in treatment to letrozole 2.5 mg daily from 09/21/2015  Letrozole toxicities: 1. Dry mouth and lack of taste is slightly better Patient appears to think that letrozole as much better tolerable than anastrozole. I discussed with her extensively about drinking more water and staying more hydrated.  Breast Cancer Surveillance: 1. Breast exam 04/22/2016 is without any clinical concerns for disease recurrence 2. Mammograms January 2017  Return to clinic in 6 months for follow-up

## 2016-04-22 NOTE — Telephone Encounter (Signed)
appt made and avs printed °

## 2016-07-22 ENCOUNTER — Telehealth: Payer: Self-pay | Admitting: Hematology and Oncology

## 2016-07-22 ENCOUNTER — Encounter: Payer: Self-pay | Admitting: Hematology and Oncology

## 2016-07-22 ENCOUNTER — Ambulatory Visit (HOSPITAL_BASED_OUTPATIENT_CLINIC_OR_DEPARTMENT_OTHER): Payer: Medicare Other | Admitting: Hematology and Oncology

## 2016-07-22 DIAGNOSIS — C50512 Malignant neoplasm of lower-outer quadrant of left female breast: Secondary | ICD-10-CM

## 2016-07-22 DIAGNOSIS — Z853 Personal history of malignant neoplasm of breast: Secondary | ICD-10-CM | POA: Diagnosis not present

## 2016-07-22 NOTE — Progress Notes (Signed)
Patient Care Team: Lajean Manes, MD as PCP - General (Internal Medicine) Tracy Margarita, MD as Consulting Physician (Cardiology) Nicholas Lose, MD as Consulting Physician (Hematology and Oncology) Arloa Koh, MD as Consulting Physician (Radiation Oncology) Sylvan Cheese, NP as Nurse Practitioner (Nurse Practitioner) Alphonsa Overall, MD as Consulting Physician (General Surgery)  DIAGNOSIS: Breast cancer of lower-outer quadrant of left female breast [facility])   Staging form: Breast, AJCC 7th Edition   - Pathologic stage from 02/14/2015: Stage IA (T1c, N0, cM0) - Unsigned   - Clinical: Stage IA (T1c, N0, M0) - Unsigned  SUMMARY OF ONCOLOGIC HISTORY:   Breast cancer of lower-outer quadrant of left female breast ([place])   01/15/2015 Initial Biopsy    Left breast biopsy: Invasive lobular cancer, grade 2, ER+ (94%), PR+ (29%),  HER-2 negative, Ki-67 19%.     01/21/2015 Breast MRI    Left breast:: 1.7 x 1.6 x 1.4 cm irregular enhancing mass lower outer quadrant, no abnormal lymph nodes     01/21/2015 Clinical Stage    Stage IA: TI cN0 M0.     02/14/2015 Surgery    Left breast lumpectomy ([facility]): Invasive lobular cancer, negative for LV I, tumor size 01.7 cm, margins negative, HER-2 was repeated and it was negative     02/14/2015 Pathologic Stage    Stage IA: T1c N0     02/26/2015 Miscellaneous    Pt opted for no RT.     03/12/2015 Procedure    Genetic testing: OvaNext gene panel  ([employer]) revealed VUS at ATM. Otherwise negative BARD1, BRCA1, BRCA2, BRIP1, CDH1, CHEK2, EPCAM, MLH1, MRE11A, MSH2, MSH6, MUTYH, NBN, NF1, PALB2, PMS2, PTEN, RAD50, RAD51C, RAD51D, SMARCA4, STK11, TP53.     04/29/2015 -  Anti-estrogen oral therapy    Anastrozole 1 mg daily changed to letrozole 2.5 mg daily 09/19/2015 due to dry mouth and loss of taste     08/22/2015 Survivorship    A copy of the survivorship care plan was mailed to the patient in lieu of an in-person visit at her request.      CHIEF  COMPLIANT: Marked improvement in dry mouth  INTERVAL HISTORY: [name] is a 82 year old with above-mentioned history of stage I left breast cancer treated with surgery and has been on oral antiestrogen therapy for a year but she could not tolerate it and treatment has been discontinued. It appears that 3 months that she had not taken the medication her dry mouth and back of taste have completely resolved. She is enjoying her life much better. Her husband is still going through her lymphoma treatment. She feels strong and energetic and plans to on a bike ride through [place].  REVIEW OF SYSTEMS:   Constitutional: Denies fevers, chills or abnormal weight loss Eyes: Denies blurriness of vision Ears, nose, mouth, throat, and face: Denies mucositis or sore throat Respiratory: Denies cough, dyspnea or wheezes Cardiovascular: Denies palpitation, chest discomfort Gastrointestinal:  Denies nausea, heartburn or change in bowel habits Skin: Denies abnormal skin rashes Lymphatics: Denies new lymphadenopathy or easy bruising Neurological:Denies numbness, tingling or new weaknesses Behavioral/Psych: Mood is stable, no new changes  Extremities: No lower extremity edema Breast:  denies any pain or lumps or nodules in either breasts All other systems were reviewed with the patient and are negative.  I have reviewed the past medical history, past surgical history, social history and family history with the patient and they are unchanged from previous note.  ALLERGIES:  is allergic to  codeine; latex; and percodan [oxycodone-aspirin].  MEDICATIONS:  Current Outpatient Prescriptions  Medication Sig Dispense Refill  . apixaban (ELIQUIS) 5 MG TABS tablet Take 1 tablet (5 mg total) by mouth 2 (two) times daily. 60 tablet 1  . Ergocalciferol (VITAMIN D2 PO) Take by mouth. 1.25 mg weekly    . letrozole (FEMARA) 2.5 MG tablet Take 2.5 mg by mouth daily.     Marland Kitchen levothyroxine (SYNTHROID,  LEVOTHROID) 75 MCG tablet Take 75 mcg by mouth daily before breakfast.    . metoprolol succinate (TOPROL-XL) 25 MG 24 hr tablet Take 25 mg by mouth daily.     Marland Kitchen oxybutynin (DITROPAN-XL) 10 MG 24 hr tablet Take 5 mg by mouth 2 (two) times daily.     . pantoprazole (PROTONIX) 40 MG tablet Take 40 mg by mouth daily.      No current facility-administered medications for this visit.     PHYSICAL EXAMINATION: ECOG PERFORMANCE STATUS: 1 - Symptomatic but completely ambulatory  Vitals:   07/22/16 0847  BP: (!) 144/76  Pulse: 60  Resp: 18  Temp: 98.1 F (36.7 C)   Filed Weights   07/22/16 0847  Weight: 161 lb 1.6 oz (73.1 kg)    GENERAL:alert, no distress and comfortable SKIN: skin color, texture, turgor are normal, no rashes or significant lesions EYES: normal, Conjunctiva are pink and non-injected, sclera clear OROPHARYNX:no exudate, no erythema and lips, buccal mucosa, and tongue normal  NECK: supple, thyroid normal size, non-tender, without nodularity LYMPH:  no palpable lymphadenopathy in the cervical, axillary or inguinal LUNGS: clear to auscultation and percussion with normal breathing effort HEART: regular rate & rhythm and no murmurs and no lower extremity edema ABDOMEN:abdomen soft, non-tender and normal bowel sounds MUSCULOSKELETAL:no cyanosis of digits and no clubbing  NEURO: alert & oriented x 3 with fluent speech, no focal motor/sensory deficits EXTREMITIES: No lower extremity edema BREAST: No palpable masses or nodules in either right or left breasts. No palpable axillary supraclavicular or infraclavicular adenopathy no breast tenderness or nipple discharge. (exam performed in the presence of a chaperone)  LABORATORY DATA:  I have reviewed the data as listed   Chemistry      Component Value Date/Time   NA 140 02/17/2015 1054   K 3.8 02/17/2015 1054   CL 103 02/11/2015 1415   CO2 25 02/17/2015 1054   BUN 15.8 02/17/2015 1054   CREATININE 0.59 12/16/2015 1500    CREATININE 0.7 02/17/2015 1054      Component Value Date/Time   CALCIUM 8.9 02/17/2015 1054   ALKPHOS 102 02/17/2015 1054   AST 17 02/17/2015 1054   ALT 13 02/17/2015 1054   BILITOT 0.31 02/17/2015 1054       Lab Results  Component Value Date   WBC 6.6 02/17/2015   HGB 11.1 (L) 02/17/2015   HCT 34.3 (L) 02/17/2015   MCV 87.6 02/17/2015   PLT 348 02/17/2015   NEUTROABS 4.5 02/17/2015     ASSESSMENT & PLAN:  Breast cancer of lower-outer quadrant of left female breast eft breast biopsy: Invasive lobular cancer, grade 2, ER 94%, PR 29%, Ki-67 19%, HER-2 negative, 1.7 x 1.6 x 1.4 cm by MRI on 01/21/2015, clinical stage TI cN0 M0 stage IA. Final pathology: 1.7 cm invasive lobular cancer, grade 1, ER 94%, PA 29%, Ki-67 19%, HER-2 negative, T1 cN0 M0 stage IA pathologic staging Current treatment: Anastrozole 1 mg daily started 04/27/2015 stopped August 2016 due to dry mouth and lack of taste; change in treatment to letrozole  2.5 mg daily from 09/21/2015  Letrozole toxicities: 1. Dry mouth and lack of taste These symptoms have resolved since she stopped anastrozole. She decided that she does not want to take any other antiestrogen treatment.  Breast Cancer Surveillance: 1. Breast exam 04/22/2016 is without any clinical concerns for disease recurrence 2. Mammograms January 2017  Return to clinic in 1 year for follow-up   No orders of the defined types were placed in this encounter.  The patient has a good understanding of the overall plan. she agrees with it. she will call with any problems that may develop before the next visit here.   Rulon Eisenmenger, MD 07/22/16

## 2016-07-22 NOTE — Assessment & Plan Note (Signed)
eft breast biopsy: Invasive lobular cancer, grade 2, ER 94%, PR 29%, Ki-67 19%, HER-2 negative, 1.7 x 1.6 x 1.4 cm by MRI on 01/21/2015, clinical stage TI cN0 M0 stage IA. Final pathology: 1.7 cm invasive lobular cancer, grade 1, ER 94%, PA 29%, Ki-67 19%, HER-2 negative, T1 cN0 M0 stage IA pathologic staging Current treatment: Anastrozole 1 mg daily started 04/27/2015 stopped August 2016 due to dry mouth and lack of taste; change in treatment to letrozole 2.5 mg daily from 09/21/2015  Letrozole toxicities: 1. Dry mouth and lack of taste Continues to be a major problem for her. She would like to discontinue letrozole for 3 months to see if it makes it any better. If it is related to letrozole, she plans to not take any further antiestrogen therapy. I also discussed whether she would like to take tamoxifen. But she is not interested in switching to another medication. We will make a final decision on all of this in 3 months when she comes back.  Breast Cancer Surveillance: 1. Breast exam 04/22/2016 is without any clinical concerns for disease recurrence 2. Mammograms January 2017  Return to clinic in 6 months for follow-up

## 2016-07-22 NOTE — Telephone Encounter (Signed)
appt made and avs printed °

## 2016-08-23 ENCOUNTER — Other Ambulatory Visit: Payer: Self-pay | Admitting: Hematology and Oncology

## 2016-08-23 DIAGNOSIS — C50512 Malignant neoplasm of lower-outer quadrant of left female breast: Secondary | ICD-10-CM

## 2017-01-18 ENCOUNTER — Telehealth: Payer: Self-pay | Admitting: Cardiology

## 2017-01-18 NOTE — Telephone Encounter (Signed)
New Message    Pt c/o Shortness Of Breath: STAT if SOB developed within the last 24 hours or pt is noticeably SOB on the phone  1. Are you currently SOB (can you hear that pt is SOB on the phone)? no  2. How long have you been experiencing SOB?  4day  3. Are you SOB when sitting or when up moving around? yes  4. Are you currently experiencing any other symptoms? No   I schedule pt appt for 2/9 8am with Dayna Dunn,  Pt also stated she wants to switch from dr Radford Pax to Dr Gwenlyn Found for cardiology care

## 2017-01-18 NOTE — Telephone Encounter (Signed)
Patient reports SOB for about 3-4 days. She states she cannot lie flat and it is hard to sleep. She also states she is having trouble climbing stairs. She is not audibly SOB on the phone and says she "feels fine" right now. She states she is currently on a prednisone taper for "neck issues" and does not know if the prednisone could be causing the SOB. She has 2 more pills to take. She denies any other symptoms - CP, swelling, weight gain, palpitations.  Attempted to confirm med list with patient - she states she is only taking Eliquis once daily. Informed her that she should be taking this BID but she wishes to speak with provider first. Scheduled patient for evaluation tomorrow with V. Bhagat. She understands to bring all of her medications to the office to ensure her medications are correct.  Patient also wishes to schedule appropriate follow-up with Dr. Gwenlyn Found after OV tomorrow.

## 2017-01-19 ENCOUNTER — Ambulatory Visit (INDEPENDENT_AMBULATORY_CARE_PROVIDER_SITE_OTHER): Payer: Medicare Other | Admitting: Physician Assistant

## 2017-01-19 VITALS — BP 122/62 | HR 141 | Ht 68.0 in | Wt 164.0 lb

## 2017-01-19 DIAGNOSIS — I471 Supraventricular tachycardia: Secondary | ICD-10-CM | POA: Diagnosis not present

## 2017-01-19 DIAGNOSIS — I480 Paroxysmal atrial fibrillation: Secondary | ICD-10-CM | POA: Diagnosis not present

## 2017-01-19 DIAGNOSIS — R269 Unspecified abnormalities of gait and mobility: Secondary | ICD-10-CM | POA: Insufficient documentation

## 2017-01-19 DIAGNOSIS — M8580 Other specified disorders of bone density and structure, unspecified site: Secondary | ICD-10-CM | POA: Insufficient documentation

## 2017-01-19 DIAGNOSIS — R002 Palpitations: Secondary | ICD-10-CM | POA: Diagnosis not present

## 2017-01-19 DIAGNOSIS — R55 Syncope and collapse: Secondary | ICD-10-CM

## 2017-01-19 DIAGNOSIS — E039 Hypothyroidism, unspecified: Secondary | ICD-10-CM | POA: Insufficient documentation

## 2017-01-19 DIAGNOSIS — I482 Chronic atrial fibrillation, unspecified: Secondary | ICD-10-CM | POA: Insufficient documentation

## 2017-01-19 DIAGNOSIS — I10 Essential (primary) hypertension: Secondary | ICD-10-CM | POA: Diagnosis not present

## 2017-01-19 MED ORDER — METOPROLOL SUCCINATE ER 50 MG PO TB24: 50.0000 mg | ORAL_TABLET | Freq: Every day | ORAL | 3 refills | Status: DC

## 2017-01-19 MED ORDER — APIXABAN 5 MG PO TABS: 5.0000 mg | ORAL_TABLET | Freq: Two times a day (BID) | ORAL | 1 refills | Status: DC

## 2017-01-19 NOTE — Patient Instructions (Addendum)
Your physician has recommended you make the following change in your medication:  INCREASE  ELIQUIS  TO   TWICE  DAILY  METOPROLOL  50 MG  EVERY DAY    Your physician recommends that you return for lab work in:  [place]  TSH  Your physician has requested that you have an echocardiogram. Echocardiography is a painless test that uses sound waves to create images of your heart. It provides your doctor with information about the size and shape of your heart and how well your heart's chambers and valves are working. This procedure takes approximately one hour. There are no restrictions for this procedure.   Your physician recommends that you schedule a follow-up appointment in: [place]  Lovena Le

## 2017-01-19 NOTE — Progress Notes (Signed)
Cardiology Office Note    Date:  01/19/2017   ID:  [name], DOB [birth year], MRN [id number]  PCP:  [name], MD  Cardiologist:  Dr. Radford Pax EP: Dr. Lovena Le (last seen 2012)  Chief Complaint: SOB  History of Present Illness:   [name] is a 83 y.o. female s/p AVNRT ablation, HTN, MVP, PAF, L breast cancer (in remission - followed by Dr. Lindi Adie) and chronic venous insufficiency presents for shortness of breath evaluation.  Last seen by Dr. Radford Pax in 05/2014.  Admitted to my scheduled for evaluation of shortness of breath for the past 4 days. Its been intermittent and getting worse. Episodes last for 10-15 minutes. This feeling is similar when she had a ablation. She is currently taking from XL 12.5mg  daily for many years. Only takes Eliquis  5 mg once a day due to cost. She was previously on warfarin and did not like regular INR checks. Pt denies chest pain, orthopnea, PND, lower extremity edema, melena or blood in his stool or urine.  History of loss of consciousness for a few seconds about week ago. Did not seek medical attention. Did not hit her head.   Past Medical History:  Diagnosis Date  . Allergic rhinitis   . Anemia   . Breast cancer, left breast ([place]) 01/15/15   Invasive Mammary  . Chronic venous insufficiency   . Hematuria    negative workup - Dr. Reece Agar  . Hypertension   . Migraine headache   . MVP (mitral valve prolapse)   . PAF (paroxysmal atrial fibrillation) ([place])   . SVT (supraventricular tachycardia) ([place]) 06/2010   s/p AVNRT ablation  . Wears glasses     Past Surgical History:  Procedure Laterality Date  . ABDOMINAL HYSTERECTOMY    . BREAST LUMPECTOMY WITH RADIOACTIVE SEED LOCALIZATION Left 02/14/2015   Procedure: BREAST LUMPECTOMY WITH RADIOACTIVE SEED LOCALIZATION;  Surgeon: Alphonsa Overall, MD;  Location: [facility];  Service: General;  Laterality: Left;  . BREAST SURGERY  1990   rt br bx  . CARDIAC CATHETERIZATION   2011   ablasion  . COLONOSCOPY    . DILATION AND CURETTAGE OF UTERUS    . Left Breast Biopsy  01/15/15  . TUBAL LIGATION      Current Medications: Prior to Admission medications   Medication Sig Start Date End Date Taking? Authorizing Provider  apixaban (ELIQUIS) 5 MG TABS tablet Take 1 tablet (5 mg total) by mouth 2 (two) times daily. 02/16/15   Alphonsa Overall, MD  Ergocalciferol (VITAMIN D2 PO) Take by mouth. 1.25 mg weekly    Historical Provider, MD  levothyroxine (SYNTHROID, LEVOTHROID) 75 MCG tablet Take 75 mcg by mouth daily before breakfast.    Historical Provider, MD  metoprolol succinate (TOPROL-XL) 25 MG 24 hr tablet Take 25 mg by mouth daily.  03/12/14   Historical Provider, MD  oxybutynin (DITROPAN-XL) 10 MG 24 hr tablet Take 5 mg by mouth 2 (two) times daily.  05/15/14   Historical Provider, MD  pantoprazole (PROTONIX) 40 MG tablet Take 40 mg by mouth daily.  03/12/14   Historical Provider, MD    Allergies:   Codeine; Latex; and Percodan [oxycodone-aspirin]   Social History   Social History  . Marital status: Married    Spouse name: N/A  . Number of children: 3  . Years of education: N/A   Social History Main Topics  . Smoking status: Never Smoker  . Smokeless tobacco: Never Used  . Alcohol  use 0.0 oz/week     Comment: once a week  . Drug use: No  . Sexual activity: Not on file   Other Topics Concern  . Not on file   Social History Narrative  . No narrative on file     Family History:  The patient's family history includes Breast cancer in her cousin, paternal aunt, and sister; Breast cancer (age of onset: 33) in her daughter; Cancer in her paternal aunt; Heart attack in her brother; Throat cancer in her paternal uncle.  ROS:   Please see the history of present illness.    ROS All other systems reviewed and are negative.   PHYSICAL EXAM:   VS:  BP 122/62   Pulse (!) 141   Ht 5\' 8"  (1.727 m)   Wt 164 lb (74.4 kg)   SpO2 99%   BMI 24.94 kg/m    GEN: Well  nourished, well developed, in no acute distress  HEENT: normal  Neck: no JVD, carotid bruits, or masses Cardiac: RR tachycardic; no murmurs, rubs, or gallops,no edema  Respiratory:  clear to auscultation bilaterally, normal work of breathing GI: soft, nontender, nondistended, + BS MS: no deformity or atrophy  Skin: warm and dry, no rash Neuro:  Alert and Oriented x 3, Strength and sensation are intact Psych: euthymic mood, full affect  Wt Readings from Last 3 Encounters:  01/19/17 164 lb (74.4 kg)  07/22/16 161 lb 1.6 oz (73.1 kg)  04/22/16 165 lb 4.8 oz (75 kg)      Studies/Labs Reviewed:   EKG:  EKG is ordered today.  The ekg ordered today demonstrates SVT.atrial tachycardia  at rate of 141 bpm.   Recent Labs: No results found for requested labs within last 8760 hours.   Lipid Panel    Component Value Date/Time   CHOL  04/20/2010 0550    128        ATP III CLASSIFICATION:  <200     mg/dL   Desirable  200-239  mg/dL   Borderline High  >=240    mg/dL   High          TRIG 39 04/20/2010 0550   HDL 51 04/20/2010 0550   CHOLHDL 2.5 04/20/2010 0550   VLDL 8 04/20/2010 0550   LDLCALC  04/20/2010 0550    69        Total Cholesterol/HDL:CHD Risk Coronary Heart Disease Risk Table                     Men   Women  1/2 Average Risk   3.4   3.3  Average Risk       5.0   4.4  2 X Average Risk   9.6   7.1  3 X Average Risk  23.4   11.0        Use the calculated Patient Ratio above and the CHD Risk Table to determine the patient's CHD Risk.        ATP III CLASSIFICATION (LDL):  <100     mg/dL   Optimal  100-129  mg/dL   Near or Above                    Optimal  130-159  mg/dL   Borderline  160-189  mg/dL   High  >190     mg/dL   Very High    Additional studies/ records that were reviewed today include:   As above   ASSESSMENT &  PLAN:   1. SVT s/p AVRNT ablation 2011 - Noted to be in SVT vs atrial tachycardia at rate of 140s. He is not feeling any palpitation  however feels shortness of breath. GET TSH and echo. Discuss with Dr. Lovena Le.  2. PAF  -  Her CHADS2VASC score is 4 (age > 2, female - 1, HTN - 1). Previously on Coumadin. Currently takes Eliquis 5 mg once a day due to cost. This importance of twice a day dose. He will check with insurance regarding co-pay of Eliquis vs Xarelto. Continue Eliquis 5mg  BID.  3. HTN  - Well controlled on BP  4. MVP - Will get echo   5. Syncope - could be due to A. fib/SVT. No recurrence. As above. She will go to ER if worsening of symptoms or recurrent syncope. Patient is agree with the plan.   Medication Adjustments/Labs and Tests Ordered: Current medicines are reviewed at length with the patient today.  Concerns regarding medicines are outlined above.  Medication changes, Labs and Tests ordered today are listed in the Patient Instructions below. Patient Instructions  Your physician has recommended you make the following change in your medication:  INCREASE  ELIQUIS  TO   TWICE  DAILY  METOPROLOL  50 MG  EVERY DAY    Your physician recommends that you return for lab work in:  [place]  TSH  Your physician has requested that you have an echocardiogram. Echocardiography is a painless test that uses sound waves to create images of your heart. It provides your doctor with information about the size and shape of your heart and how well your heart's chambers and valves are working. This procedure takes approximately one hour. There are no restrictions for this procedure.   Your physician recommends that you schedule a follow-up appointment in: 1-2 WEEKS  WITH DR  Norina Buzzard, [place], [place]  01/19/2017 4:19 PM    [facility] [place], [place], [place]  [address] Phone: [phone]; Fax: [phone]

## 2017-01-20 LAB — TSH: TSH: 4.8 u[IU]/mL — AB (ref 0.450–4.500)

## 2017-01-21 ENCOUNTER — Ambulatory Visit: Payer: Medicare Other | Admitting: Physician Assistant

## 2017-01-28 ENCOUNTER — Ambulatory Visit (INDEPENDENT_AMBULATORY_CARE_PROVIDER_SITE_OTHER): Payer: Medicare Other | Admitting: Internal Medicine

## 2017-01-28 ENCOUNTER — Other Ambulatory Visit: Payer: Self-pay

## 2017-01-28 ENCOUNTER — Ambulatory Visit (HOSPITAL_COMMUNITY): Payer: Medicare Other | Attending: Cardiology

## 2017-01-28 ENCOUNTER — Encounter: Payer: Self-pay | Admitting: Internal Medicine

## 2017-01-28 VITALS — BP 128/84 | HR 123 | Ht 68.0 in | Wt 166.6 lb

## 2017-01-28 DIAGNOSIS — R002 Palpitations: Secondary | ICD-10-CM | POA: Insufficient documentation

## 2017-01-28 DIAGNOSIS — Z01812 Encounter for preprocedural laboratory examination: Secondary | ICD-10-CM | POA: Diagnosis not present

## 2017-01-28 DIAGNOSIS — I081 Rheumatic disorders of both mitral and tricuspid valves: Secondary | ICD-10-CM | POA: Diagnosis not present

## 2017-01-28 DIAGNOSIS — I4892 Unspecified atrial flutter: Secondary | ICD-10-CM | POA: Diagnosis not present

## 2017-01-28 DIAGNOSIS — I480 Paroxysmal atrial fibrillation: Secondary | ICD-10-CM | POA: Insufficient documentation

## 2017-01-28 DIAGNOSIS — Z853 Personal history of malignant neoplasm of breast: Secondary | ICD-10-CM | POA: Diagnosis not present

## 2017-01-28 DIAGNOSIS — I10 Essential (primary) hypertension: Secondary | ICD-10-CM | POA: Diagnosis not present

## 2017-01-28 NOTE — Progress Notes (Signed)
HPI [name] returns today after a long absence from our arrhythmia clinic. She is a pleasant 83 yo woman with SVT, s/p catheter ablation of AVNRT in 2011. She did well but was seen in our office a week ago with a regular narrow QRS tachycardia, either atrial tachy with 1:1 conduction or atrial flutter with 2:1 conduction. She was placed on anti-coagulation and AV nodal blocking drugs. She appears to have a 2:1 flutter vs atrial tachy with 2:1 conduction. She feels a bit better. No other complaints today.  Allergies  Allergen Reactions  . Codeine Other (See Comments)    unknown  . Latex Itching  . Percodan [Oxycodone-Aspirin] Nausea And Vomiting    Nausea      Current Outpatient Prescriptions  Medication Sig Dispense Refill  . apixaban (ELIQUIS) 5 MG TABS tablet Take 1 tablet (5 mg total) by mouth 2 (two) times daily. 60 tablet 1  . Cholecalciferol (VITAMIN D PO) Take 1.25 mg by mouth as directed.    Marland Kitchen levothyroxine (SYNTHROID, LEVOTHROID) 75 MCG tablet Take 75 mcg by mouth daily before breakfast.    . metoprolol succinate (TOPROL-XL) 50 MG 24 hr tablet Take 1 tablet (50 mg total) by mouth daily. 90 tablet 3  . oxybutynin (DITROPAN-XL) 10 MG 24 hr tablet Take 10 mg by mouth 2 (two) times daily.     . pantoprazole (PROTONIX) 40 MG tablet Take 40 mg by mouth daily.      No current facility-administered medications for this visit.      Past Medical History:  Diagnosis Date  . Allergic rhinitis   . Anemia   . Breast cancer, left breast ([place]) 01/15/15   Invasive Mammary  . Chronic venous insufficiency   . Hematuria    negative workup - Dr. Reece Agar  . Hypertension   . Migraine headache   . MVP (mitral valve prolapse)   . PAF (paroxysmal atrial fibrillation) ([place])   . SVT (supraventricular tachycardia) ([place]) 06/2010   s/p AVNRT ablation  . Wears glasses     ROS:   All systems reviewed and negative except as noted in the HPI.   Past Surgical History:  Procedure  Laterality Date  . ABDOMINAL HYSTERECTOMY    . BREAST LUMPECTOMY WITH RADIOACTIVE SEED LOCALIZATION Left 02/14/2015   Procedure: BREAST LUMPECTOMY WITH RADIOACTIVE SEED LOCALIZATION;  Surgeon: Alphonsa Overall, MD;  Location: [facility];  Service: General;  Laterality: Left;  . BREAST SURGERY  1990   rt br bx  . CARDIAC CATHETERIZATION  2011   ablasion  . COLONOSCOPY    . DILATION AND CURETTAGE OF UTERUS    . Left Breast Biopsy  01/15/15  . TUBAL LIGATION       Family History  Problem Relation Age of Onset  . Heart attack Brother   . Throat cancer Paternal Uncle   . Breast cancer Sister     early 61s  . Breast cancer Daughter 60    Negative genetic testing   . Breast cancer Paternal Aunt     dx over 58  . Cancer Paternal Aunt     cancer in her leg  . Breast cancer Cousin     multiple paternal cousin     Social History   Social History  . Marital status: Married    Spouse name: N/A  . Number of children: 3  . Years of education: N/A   Occupational History  . Not on file.   Social  History Main Topics  . Smoking status: Never Smoker  . Smokeless tobacco: Never Used  . Alcohol use 0.0 oz/week     Comment: once a week  . Drug use: No  . Sexual activity: Not on file   Other Topics Concern  . Not on file   Social History Narrative  . No narrative on file     BP 128/84   Pulse (!) 123   Ht 5\' 8"  (1.727 m)   Wt 166 lb 9.6 oz (75.6 kg)   BMI 25.33 kg/m   Physical Exam:  Well appearing 83 yo woman, NAD HEENT: Unremarkable Neck:  No JVD, no thyromegally Lymphatics:  No adenopathy Back:  No CVA tenderness Lungs:  Clear with no wheezes HEART:  Regular rate rhythm, no murmurs, no rubs, no clicks Abd:  soft, positive bowel sounds, no organomegally, no rebound, no guarding Ext:  2 plus pulses, no edema, no cyanosis, no clubbing Skin:  No rashes no nodules Neuro:  CN II through XII intact, motor grossly intact  EKG - atrial flutter with a  RVR  DEVICE  Normal device function.  See PaceArt for details.   Assess/Plan: 1. Atrial flutter vs tachycardia with a RVR - we discussed the treatment options with the patient. We will have her come in for atrial flutter/tachy ablation with CARTO. She has been anti-coagulated. 2. Chronic diastolic heart failure - her symptoms are class 2 and almost exclusively due to her rapid rate. 3. Atrial fib - this is not well documented. However she certainly may have this again. She will likely need to continue her anti-coagulation.  Mikle Bosworth.D.

## 2017-01-28 NOTE — Patient Instructions (Addendum)
Medication Instructions:  Please continue all medications as listed.  Labwork: TODAY - BMET/CBC w/ diff / PT/INR   Testing/Procedures: Your physician has recommended that you have an ablation. Catheter ablation is a medical procedure used to treat some cardiac arrhythmias (irregular heartbeats). During catheter ablation, a long, thin, flexible tube is put into a blood vessel in your groin (upper thigh), or neck. This tube is called an ablation catheter. It is then guided to your heart through the blood vessel. Radio frequency waves destroy small areas of heart tissue where abnormal heartbeats may cause an arrhythmia to start. Please see the instruction sheet given to you today.  Follow-Up: Follow-up appointment and 4 weeks with Dr. Lovena Le after 02/04/17  Any Other Special Instructions Will Be Listed Below (If Applicable).  Please report to the [facility] 02/04/17 at 7:30 AM  Nothing to eat or drink after midnight prior to procedure  Do not take any medication in the morning prior to procedure  Plan 1 night stay OR someone to drive you home after the procedure    If you need a refill on your cardiac medications before your next appointment, please call your pharmacy.

## 2017-01-29 LAB — CBC WITH DIFFERENTIAL/PLATELET
BASOS ABS: 0 10*3/uL (ref 0.0–0.2)
Basos: 0 %
EOS (ABSOLUTE): 0.1 10*3/uL (ref 0.0–0.4)
Eos: 2 %
HEMOGLOBIN: 12.2 g/dL (ref 11.1–15.9)
Hematocrit: 37.4 % (ref 34.0–46.6)
IMMATURE GRANS (ABS): 0 10*3/uL (ref 0.0–0.1)
Immature Granulocytes: 0 %
Lymphocytes Absolute: 1.6 10*3/uL (ref 0.7–3.1)
Lymphs: 24 %
MCH: 28.9 pg (ref 26.6–33.0)
MCHC: 32.6 g/dL (ref 31.5–35.7)
MCV: 89 fL (ref 79–97)
MONOCYTES: 7 %
Monocytes Absolute: 0.4 10*3/uL (ref 0.1–0.9)
Neutrophils Absolute: 4.5 10*3/uL (ref 1.4–7.0)
Neutrophils: 67 %
PLATELETS: 196 10*3/uL (ref 150–379)
RBC: 4.22 x10E6/uL (ref 3.77–5.28)
RDW: 14.9 % (ref 12.3–15.4)
WBC: 6.6 10*3/uL (ref 3.4–10.8)

## 2017-01-29 LAB — PROTIME-INR
INR: 1 (ref 0.8–1.2)
PROTHROMBIN TIME: 10.9 s (ref 9.1–12.0)

## 2017-01-29 LAB — BASIC METABOLIC PANEL
BUN/Creatinine Ratio: 23 (ref 12–28)
BUN: 16 mg/dL (ref 8–27)
CALCIUM: 8.7 mg/dL (ref 8.7–10.3)
CO2: 20 mmol/L (ref 18–29)
Chloride: 106 mmol/L (ref 96–106)
Creatinine, Ser: 0.69 mg/dL (ref 0.57–1.00)
GFR calc Af Amer: 94 mL/min/{1.73_m2} (ref >59)
GFR calc non Af Amer: 82 mL/min/{1.73_m2} (ref >59)
GLUCOSE: 124 mg/dL — AB (ref 65–99)
POTASSIUM: 3.9 mmol/L (ref 3.5–5.2)
Sodium: 142 mmol/L (ref 134–144)

## 2017-02-01 ENCOUNTER — Telehealth: Payer: Self-pay | Admitting: *Deleted

## 2017-02-01 MED ORDER — LISINOPRIL 10 MG PO TABS
10.0000 mg | ORAL_TABLET | Freq: Every day | ORAL | 3 refills | Status: DC
Start: 2017-02-01 — End: 2017-02-01

## 2017-02-01 MED ORDER — LISINOPRIL 10 MG PO TABS: 10.0000 mg | ORAL_TABLET | Freq: Every day | ORAL | 0 refills | Status: DC

## 2017-02-01 NOTE — Telephone Encounter (Signed)
Pt has been made aware of her echo results. She will start Lisinopril 10 mg qd. Called into [facility], per pt request.

## 2017-02-04 ENCOUNTER — Encounter (HOSPITAL_COMMUNITY)
Admission: RE | Disposition: A | Discharge status: Home / Self Care | Payer: Self-pay | Source: Ambulatory Visit | Attending: Internal Medicine

## 2017-02-04 ENCOUNTER — Ambulatory Visit (HOSPITAL_COMMUNITY)
Admission: RE | Admit: 2017-02-04 | Discharge: 2017-02-04 | Disposition: A | Discharge status: Home / Self Care | Payer: Medicare Other | Source: Ambulatory Visit | Attending: Internal Medicine | Admitting: Internal Medicine

## 2017-02-04 DIAGNOSIS — I5032 Chronic diastolic (congestive) heart failure: Secondary | ICD-10-CM | POA: Insufficient documentation

## 2017-02-04 DIAGNOSIS — I872 Venous insufficiency (chronic) (peripheral): Secondary | ICD-10-CM | POA: Insufficient documentation

## 2017-02-04 DIAGNOSIS — I471 Supraventricular tachycardia: Secondary | ICD-10-CM | POA: Diagnosis not present

## 2017-02-04 DIAGNOSIS — I4892 Unspecified atrial flutter: Secondary | ICD-10-CM | POA: Diagnosis present

## 2017-02-04 DIAGNOSIS — I341 Nonrheumatic mitral (valve) prolapse: Secondary | ICD-10-CM | POA: Insufficient documentation

## 2017-02-04 DIAGNOSIS — Z91040 Latex allergy status: Secondary | ICD-10-CM | POA: Diagnosis not present

## 2017-02-04 DIAGNOSIS — I484 Atypical atrial flutter: Secondary | ICD-10-CM | POA: Diagnosis not present

## 2017-02-04 DIAGNOSIS — Z8249 Family history of ischemic heart disease and other diseases of the circulatory system: Secondary | ICD-10-CM | POA: Insufficient documentation

## 2017-02-04 DIAGNOSIS — Z7901 Long term (current) use of anticoagulants: Secondary | ICD-10-CM | POA: Diagnosis not present

## 2017-02-04 DIAGNOSIS — I110 Hypertensive heart disease with heart failure: Secondary | ICD-10-CM | POA: Diagnosis not present

## 2017-02-04 DIAGNOSIS — I480 Paroxysmal atrial fibrillation: Secondary | ICD-10-CM | POA: Diagnosis not present

## 2017-02-04 HISTORY — PX: A-FLUTTER ABLATION: EP1230

## 2017-02-04 SURGERY — A-FLUTTER ABLATION
Anesthesia: LOCAL

## 2017-02-04 MED ORDER — HEPARIN (PORCINE) IN NACL 2-0.9 UNIT/ML-% IJ SOLN
INTRAMUSCULAR | Status: DC | PRN
  Administered 2017-02-04: 500 mL

## 2017-02-04 MED ORDER — MIDAZOLAM HCL 5 MG/5ML IJ SOLN
INTRAMUSCULAR | Status: AC
  Filled 2017-02-04: qty 5

## 2017-02-04 MED ORDER — SODIUM CHLORIDE 0.9 % IV SOLN: 250.0000 mL | INTRAVENOUS | Status: DC | PRN

## 2017-02-04 MED ORDER — BUPIVACAINE HCL (PF) 0.25 % IJ SOLN
INTRAMUSCULAR | Status: AC
  Filled 2017-02-04: qty 30

## 2017-02-04 MED ORDER — FENTANYL CITRATE (PF) 100 MCG/2ML IJ SOLN
INTRAMUSCULAR | Status: AC
  Filled 2017-02-04: qty 2

## 2017-02-04 MED ORDER — BUPIVACAINE HCL (PF) 0.25 % IJ SOLN
INTRAMUSCULAR | Status: DC | PRN
  Administered 2017-02-04: 50 mL

## 2017-02-04 MED ORDER — HEPARIN (PORCINE) IN NACL 2-0.9 UNIT/ML-% IJ SOLN
INTRAMUSCULAR | Status: AC
  Filled 2017-02-04: qty 500

## 2017-02-04 MED ORDER — SODIUM CHLORIDE 0.9% FLUSH: 3.0000 mL | INTRAVENOUS | Status: DC | PRN

## 2017-02-04 MED ORDER — ACETAMINOPHEN 325 MG PO TABS
650.0000 mg | ORAL_TABLET | ORAL | Status: DC | PRN
Start: 2017-02-04 — End: 2017-02-04

## 2017-02-04 MED ORDER — MIDAZOLAM HCL 5 MG/5ML IJ SOLN
INTRAMUSCULAR | Status: DC | PRN
  Administered 2017-02-04 (×2): 1 mg via INTRAVENOUS
  Administered 2017-02-04: 2 mg via INTRAVENOUS
  Administered 2017-02-04 (×2): 1 mg via INTRAVENOUS

## 2017-02-04 MED ORDER — ONDANSETRON HCL 4 MG/2ML IJ SOLN: 4.0000 mg | Freq: Four times a day (QID) | INTRAMUSCULAR | Status: DC | PRN

## 2017-02-04 MED ORDER — FENTANYL CITRATE (PF) 100 MCG/2ML IJ SOLN
INTRAMUSCULAR | Status: DC | PRN
  Administered 2017-02-04: 25 ug via INTRAVENOUS
  Administered 2017-02-04 (×2): 12.5 ug via INTRAVENOUS

## 2017-02-04 MED ORDER — SODIUM CHLORIDE 0.9% FLUSH: 3.0000 mL | Freq: Two times a day (BID) | INTRAVENOUS | Status: DC

## 2017-02-04 SURGICAL SUPPLY — 11 items
BAG SNAP BAND KOVER 36X36 (MISCELLANEOUS) ×2 IMPLANT
CATH HEX JOS 2-5-2 65CM 6F REP (CATHETERS) ×2 IMPLANT
CATH JOSEPH QUAD ALLRED 6F REP (CATHETERS) ×2 IMPLANT
PACK EP LATEX FREE (CUSTOM PROCEDURE TRAY) ×1
PACK EP LF (CUSTOM PROCEDURE TRAY) ×1 IMPLANT
PAD DEFIB LIFELINK (PAD) ×2 IMPLANT
PATCH CARTO3 (PAD) ×2 IMPLANT
SHEATH PINNACLE 6F 10CM (SHEATH) ×2 IMPLANT
SHEATH PINNACLE 7F 10CM (SHEATH) ×4 IMPLANT
SHEATH PINNACLE 8F 10CM (SHEATH) ×2 IMPLANT
SHIELD RADPAD SCOOP 12X17 (MISCELLANEOUS) ×2 IMPLANT

## 2017-02-04 NOTE — H&P (View-Only) (Signed)
HPI [name] returns today after a long absence from our arrhythmia clinic. She is a pleasant 83 yo woman with SVT, s/p catheter ablation of AVNRT in 2011. She did well but was seen in our office a week ago with a regular narrow QRS tachycardia, either atrial tachy with 1:1 conduction or atrial flutter with 2:1 conduction. She was placed on anti-coagulation and AV nodal blocking drugs. She appears to have a 2:1 flutter vs atrial tachy with 2:1 conduction. She feels a bit better. No other complaints today.  Allergies  Allergen Reactions  . Codeine Other (See Comments)    unknown  . Latex Itching  . Percodan [Oxycodone-Aspirin] Nausea And Vomiting    Nausea      Current Outpatient Prescriptions  Medication Sig Dispense Refill  . apixaban (ELIQUIS) 5 MG TABS tablet Take 1 tablet (5 mg total) by mouth 2 (two) times daily. 60 tablet 1  . Cholecalciferol (VITAMIN D PO) Take 1.25 mg by mouth as directed.    Marland Kitchen levothyroxine (SYNTHROID, LEVOTHROID) 75 MCG tablet Take 75 mcg by mouth daily before breakfast.    . metoprolol succinate (TOPROL-XL) 50 MG 24 hr tablet Take 1 tablet (50 mg total) by mouth daily. 90 tablet 3  . oxybutynin (DITROPAN-XL) 10 MG 24 hr tablet Take 10 mg by mouth 2 (two) times daily.     . pantoprazole (PROTONIX) 40 MG tablet Take 40 mg by mouth daily.      No current facility-administered medications for this visit.      Past Medical History:  Diagnosis Date  . Allergic rhinitis   . Anemia   . Breast cancer, left breast ([place]) 01/15/15   Invasive Mammary  . Chronic venous insufficiency   . Hematuria    negative workup - Dr. Reece Agar  . Hypertension   . Migraine headache   . MVP (mitral valve prolapse)   . PAF (paroxysmal atrial fibrillation) ([place])   . SVT (supraventricular tachycardia) ([place]) 06/2010   s/p AVNRT ablation  . Wears glasses     ROS:   All systems reviewed and negative except as noted in the HPI.   Past Surgical History:  Procedure  Laterality Date  . ABDOMINAL HYSTERECTOMY    . BREAST LUMPECTOMY WITH RADIOACTIVE SEED LOCALIZATION Left 02/14/2015   Procedure: BREAST LUMPECTOMY WITH RADIOACTIVE SEED LOCALIZATION;  Surgeon: Alphonsa Overall, MD;  Location: [facility];  Service: General;  Laterality: Left;  . BREAST SURGERY  1990   rt br bx  . CARDIAC CATHETERIZATION  2011   ablasion  . COLONOSCOPY    . DILATION AND CURETTAGE OF UTERUS    . Left Breast Biopsy  01/15/15  . TUBAL LIGATION       Family History  Problem Relation Age of Onset  . Heart attack Brother   . Throat cancer Paternal Uncle   . Breast cancer Sister     early 52s  . Breast cancer Daughter 20    Negative genetic testing   . Breast cancer Paternal Aunt     dx over 56  . Cancer Paternal Aunt     cancer in her leg  . Breast cancer Cousin     multiple paternal cousin     Social History   Social History  . Marital status: Married    Spouse name: N/A  . Number of children: 3  . Years of education: N/A   Occupational History  . Not on file.   Social  History Main Topics  . Smoking status: Never Smoker  . Smokeless tobacco: Never Used  . Alcohol use 0.0 oz/week     Comment: once a week  . Drug use: No  . Sexual activity: Not on file   Other Topics Concern  . Not on file   Social History Narrative  . No narrative on file     BP 128/84   Pulse (!) 123   Ht 5\' 8"  (1.727 m)   Wt 166 lb 9.6 oz (75.6 kg)   BMI 25.33 kg/m   Physical Exam:  Well appearing 83 yo woman, NAD HEENT: Unremarkable Neck:  No JVD, no thyromegally Lymphatics:  No adenopathy Back:  No CVA tenderness Lungs:  Clear with no wheezes HEART:  Regular rate rhythm, no murmurs, no rubs, no clicks Abd:  soft, positive bowel sounds, no organomegally, no rebound, no guarding Ext:  2 plus pulses, no edema, no cyanosis, no clubbing Skin:  No rashes no nodules Neuro:  CN II through XII intact, motor grossly intact  EKG - atrial flutter with a  RVR  DEVICE  Normal device function.  See PaceArt for details.   Assess/Plan: 1. Atrial flutter vs tachycardia with a RVR - we discussed the treatment options with the patient. We will have her come in for atrial flutter/tachy ablation with CARTO. She has been anti-coagulated. 2. Chronic diastolic heart failure - her symptoms are class 2 and almost exclusively due to her rapid rate. 3. Atrial fib - this is not well documented. However she certainly may have this again. She will likely need to continue her anti-coagulation.  [name].D.

## 2017-02-04 NOTE — Progress Notes (Signed)
Patient is seen post procedure. She is feeling well, has eaten, has no complaints IJ site dressing is dry, stable without bleeding or hematoma, R groin gauze dressing is saturated, dressing is removed without any active bleeding noted, groin is soft, non-tender, without hematoma.  Redressed. VSS Activity restrictions and wound care instructions were discussed with the patient and her husband Plan for discharge to home once bedrest is completed and sites are stable after ambulation F/u with Dr. Lovena Le has been arranged  Tommye Standard, PA-C  EP Attending  Patient seen and examined. Agree with above. Stable for DC home.  Mikle Bosworth.D.

## 2017-02-04 NOTE — Discharge Instructions (Signed)
RESUME ELIQUIS TONIGHT    No driving until [id number]. No lifting over 5 lbs for 1 week. No sexual or vigorous activity for 1 week. You may return to work on 02/11/17. Keep procedure site clean & dry. If you notice increased pain, swelling, bleeding or pus, call/return!  You may shower, but no soaking baths/hot tubs/pools for 1 week.

## 2017-02-04 NOTE — Interval H&P Note (Signed)
History and Physical Interval Note:  02/04/2017 10:11 AM  [name]  has presented today for surgery, with the diagnosis of a-flutter  The various methods of treatment have been discussed with the patient and family. After consideration of risks, benefits and other options for treatment, the patient has consented to  Procedure(s): A-Flutter Ablation (N/A) as a surgical intervention .  The patient's history has been reviewed, patient examined, no change in status, stable for surgery.  I have reviewed the patient's chart and labs.  Questions were answered to the patient's satisfaction.     [name]

## 2017-02-04 NOTE — Progress Notes (Signed)
Site area: rt fem venous sheaths removed x3 Site Prior to Removal:  Level 0 Pressure Applied For: 59min Manual:   yes Patient Status During Pull:  Pt is sleeping, easily arousable, oriented to time and place. Post Pull Site:  Level 0 Post Pull Instructions Given:  Yes and pt understands. Post Pull Pulses Present: 2+ rt dp/pt Dressing Applied:  tegaderm and a 4x4 Bedrest begins @ 13:05   Sheath from [place] pulled.Manual pressure held for 10 min. 2x2 and tegaderm applied. Groin and neck site are unremarkable. Pt leaves cath lab holding in stable condition. Bed rest starts at 13:05:00:

## 2017-02-07 ENCOUNTER — Encounter (HOSPITAL_COMMUNITY): Payer: Self-pay | Admitting: Internal Medicine

## 2017-02-07 ENCOUNTER — Telehealth: Payer: Self-pay | Admitting: Internal Medicine

## 2017-02-07 NOTE — Telephone Encounter (Signed)
Called, pt unavailable. Unable to leave voice message.  

## 2017-02-07 NOTE — Telephone Encounter (Signed)
New Message    Pt has questions about the procedure that Dr Lovena Le cancelled

## 2017-02-08 ENCOUNTER — Encounter: Payer: Self-pay | Admitting: *Deleted

## 2017-02-08 ENCOUNTER — Telehealth: Payer: Self-pay | Admitting: Internal Medicine

## 2017-02-08 ENCOUNTER — Ambulatory Visit (INDEPENDENT_AMBULATORY_CARE_PROVIDER_SITE_OTHER): Payer: Medicare Other | Admitting: Internal Medicine

## 2017-02-08 VITALS — BP 132/68 | HR 57 | Ht 68.0 in | Wt 168.0 lb

## 2017-02-08 DIAGNOSIS — R0602 Shortness of breath: Secondary | ICD-10-CM

## 2017-02-08 NOTE — Telephone Encounter (Signed)
Pt advised she has been scheduled to see Dr Lovena Le today at 11:45AM.

## 2017-02-08 NOTE — Patient Instructions (Addendum)
Medication Instructions:   Your physician recommends that you continue on your current medications as directed. Please refer to the Current Medication list given to you today.   If you need a refill on your cardiac medications before your next appointment, please call your pharmacy.  Labwork: NONE ORDERED  TODAY    Testing/Procedures: .NONE ORDERED  TODAY    Follow-Up:  You have been referred to A [facility] PULMONOLOGIST FOR SHORTNESS OF BREATH. NO NP OR PA'S  PER [place]   WITH DR Lovena Le IN 6 WEEKS.   Any Other Special Instructions Will Be Listed Below (If Applicable).

## 2017-02-08 NOTE — Telephone Encounter (Signed)
New Message    Pt called yesterday about her heart fluttering, and the procedure she was to have done got cancelled and she wants to see someone asap, has appt on 3/20 but wants see immediately I told her I could bring her in with [facility] or [facility] and she wants physcian not a APP

## 2017-02-08 NOTE — Progress Notes (Signed)
HPI [name] returns today for followup. She is a pleasant 83 yo woman with a h/o SVT who underwent ablation remotely who was found to have developed atrial flutter and underwent EP study last week. She was found to have left atrial (likely mitral annular reentry) flutter with a RVR and underwent DCCV. Ablation was not attempted.She also had inducible AVNRT at a slower rate and evidence of AV conduction system disease. She was discharged home. She returns today for followup. In the interim, she has felt poorly and c/o sob with any activity. She is not sure if she has gone back to atrial flutter.   Allergies  Allergen Reactions  . Adhesive [Tape] Rash    Including EKG electrodes and the like.  . Codeine Other (See Comments)    unknown  . Latex Itching  . Percodan [Oxycodone-Aspirin] Nausea And Vomiting    Nausea      Current Outpatient Prescriptions  Medication Sig Dispense Refill  . apixaban (ELIQUIS) 5 MG TABS tablet Take 1 tablet (5 mg total) by mouth 2 (two) times daily. 60 tablet 1  . Carboxymethylcellul-Glycerin (LUBRICATING EYE DROPS OP) Apply 1 drop to eye daily as needed (dry eyes).    Marland Kitchen levothyroxine (SYNTHROID, LEVOTHROID) 75 MCG tablet Take 75 mcg by mouth daily before breakfast.    . lisinopril (PRINIVIL,ZESTRIL) 10 MG tablet Take 1 tablet (10 mg total) by mouth daily. 30 tablet 0  . metoprolol succinate (TOPROL-XL) 50 MG 24 hr tablet Take 1 tablet (50 mg total) by mouth daily. 90 tablet 3  . oxybutynin (DITROPAN) 5 MG tablet Take 5 mg by mouth 2 (two) times daily.    . pantoprazole (PROTONIX) 40 MG tablet Take 40 mg by mouth daily.     . Vitamin D, Ergocalciferol, (DRISDOL) 50000 units CAPS capsule Take 50,000 Units by mouth every 7 (seven) days.     No current facility-administered medications for this visit.      Past Medical History:  Diagnosis Date  . Allergic rhinitis   . Anemia   . Breast cancer, left breast ([place]) 01/15/15   Invasive Mammary  . Chronic  venous insufficiency   . Hematuria    negative workup - Dr. Reece Agar  . Hypertension   . Migraine headache   . MVP (mitral valve prolapse)   . PAF (paroxysmal atrial fibrillation) ([place])   . SVT (supraventricular tachycardia) ([place]) 06/2010   s/p AVNRT ablation  . Wears glasses     ROS:   All systems reviewed and negative except as noted in the HPI.   Past Surgical History:  Procedure Laterality Date  . A-FLUTTER ABLATION N/A 02/04/2017   Procedure: A-Flutter Ablation;  Surgeon: Evans Lance, MD;  Location: [facility];  Service: Cardiovascular;  Laterality: N/A;  . ABDOMINAL HYSTERECTOMY    . BREAST LUMPECTOMY WITH RADIOACTIVE SEED LOCALIZATION Left 02/14/2015   Procedure: BREAST LUMPECTOMY WITH RADIOACTIVE SEED LOCALIZATION;  Surgeon: Alphonsa Overall, MD;  Location: [facility];  Service: General;  Laterality: Left;  . BREAST SURGERY  1990   rt br bx  . CARDIAC CATHETERIZATION  2011   ablasion  . COLONOSCOPY    . DILATION AND CURETTAGE OF UTERUS    . Left Breast Biopsy  01/15/15  . TUBAL LIGATION       Family History  Problem Relation Age of Onset  . Hypertension Mother   . Heart disease Mother   . Arthritis Mother   . Other  Father     hardening of the arteries  . Heart Problems Sister     pacemaker  . Heart attack Brother   . Throat cancer Paternal Uncle   . Breast cancer Sister     early 23s  . Alzheimer's disease Sister   . Pulmonary disease Sister   . Breast cancer Daughter 55    Negative genetic testing   . Breast cancer Paternal Aunt     dx over 60  . Cancer Paternal Aunt     cancer in her leg  . Breast cancer Cousin     multiple paternal cousin     Social History   Social History  . Marital status: Married    Spouse name: N/A  . Number of children: 3  . Years of education: N/A   Occupational History  . Not on file.   Social History Main Topics  . Smoking status: Never Smoker  . Smokeless tobacco: Never Used  . Alcohol  use 0.0 oz/week     Comment: once a week  . Drug use: No  . Sexual activity: Not on file   Other Topics Concern  . Not on file   Social History Narrative  . No narrative on file     BP 132/68   Pulse (!) 57   Ht 5\' 8"  (1.727 m)   Wt 168 lb (76.2 kg)   BMI 25.54 kg/m   Physical Exam:  Well appearing elderly woman, NAD HEENT: Unremarkable Neck:  6 cm JVD, no thyromegally Lymphatics:  No adenopathy Back:  No CVA tenderness Lungs:  Clear with no wheezes HEART:  Regular rate rhythm, no murmurs, no rubs, no clicks Abd:  soft, positive bowel sounds, no organomegally, no rebound, no guarding Ext:  2 plus pulses, no edema, no cyanosis, no clubbing Skin:  No rashes no nodules Neuro:  CN II through XII intact, motor grossly intact  EKG - sinus bradycardia with first degree AV block   Assess/Plan: 1. Dyspnea - today we had her walk with a pulse oximeter on room air and discover that her HR increases appropriately into the low 100's but her oxygen saturation dropped into the 65 - 70 % range. It recovered back to the low 90's with rest. In retrospect she thinks her sob has only been present for a few months. She does not have a significant smoking history. I have recommended referral to pulmonary clinic. I will defer initiation of oxygen by nasal cannula to the lung specialists. She needs PFT's and a chest CT. 2. Atrial flutter - she is currently maintaining NSR. Her atrial flutter cannot be causing her dyspnea as she desaturates in NSR with exertion (slow walking).  3. Breast CA - in remission.  4. HTN - her blood pressure is mostly well controlled. Will follow.  Mikle Bosworth.D.

## 2017-02-09 ENCOUNTER — Ambulatory Visit: Payer: Medicare Other | Admitting: Internal Medicine

## 2017-02-11 ENCOUNTER — Institutional Professional Consult (permissible substitution): Payer: Medicare Other | Admitting: Pulmonary Disease

## 2017-02-11 ENCOUNTER — Ambulatory Visit (INDEPENDENT_AMBULATORY_CARE_PROVIDER_SITE_OTHER): Payer: Medicare Other | Admitting: Internal Medicine

## 2017-02-11 ENCOUNTER — Encounter: Payer: Self-pay | Admitting: Internal Medicine

## 2017-02-11 ENCOUNTER — Telehealth: Payer: Self-pay | Admitting: Internal Medicine

## 2017-02-11 DIAGNOSIS — R0689 Other abnormalities of breathing: Secondary | ICD-10-CM

## 2017-02-11 DIAGNOSIS — R0600 Dyspnea, unspecified: Secondary | ICD-10-CM | POA: Diagnosis not present

## 2017-02-11 LAB — NITRIC OXIDE: Nitric Oxide: 30

## 2017-02-11 NOTE — Addendum Note (Signed)
Addended by: Collier Salina on: 02/11/2017 12:23 PM   Modules accepted: Orders

## 2017-02-11 NOTE — Assessment & Plan Note (Addendum)
Shortness of breath likely related to chronic systolic heart failure which is low ejection fraction week or that was seen in echocardiogram February 2018. The cardiologists are managing the situation with medications  I plan to ensure that there is no lung disease contributing to this shortness of breath.   The nitric oxide test was borderline and therefore you might have asthma; but lisinopril could be contirubting to wheeze  We do need to rule out scarring disease of the lung  PLAN - Do high resolution CT chest without contrast in the next few to several weeks  Follow-up - I will discuss with Dr Lovena Le about alternative to lisinopril - Return in the next few to several weeks but after completion of the high resolution CT chest. -Return to see myself Dr. Chase Caller our nurse practitioner to discuss these results -ICS an option at followuop

## 2017-02-11 NOTE — Patient Instructions (Signed)
Dyspnea and respiratory abnormality Shortness of breath likely related to chronic systolic heart failure which is low ejection fraction week or that was seen in echocardiogram February 2018. The cardiologists are managing the situation with medications  I plan to ensure that there is no lung disease contributing to this shortness of breath.   The nitric oxide test was borderline and therefore you might have asthma; but lisinopril could be contirubting to wheeze  We do need to rule out scarring disease of the lung  PLAN - Do high resolution CT chest without contrast in the next few to several weeks  Follow-up - I will discuss with Dr Lovena Le about alternative to lisinopril - Return in the next few to several weeks but after completion of the high resolution CT chest. -Return to see myself Dr. Chase Caller our nurse practitioner to discuss these results

## 2017-02-11 NOTE — Telephone Encounter (Signed)
Hi GReg  [name] did not desaturate with forehead probe. I am getting HRCT chest to rule out ILD. But nitric oxide test is borderline for asthma: lisinopril could be weighing. Is it possible to change to ARB?  THanks  Dr. Brand Males, M.D., [facility].C.P Pulmonary and Critical Care Medicine Staff Physician [place] [facility] Pager: [phone], If no answer or between  15:00h - 7:00h: call [phone]  02/11/2017 11:20 AM

## 2017-02-11 NOTE — Addendum Note (Signed)
Addended by: Valerie Salts on: 02/11/2017 11:22 AM   Modules accepted: Orders

## 2017-02-11 NOTE — Progress Notes (Signed)
Subjective:    Patient ID: [name], female    DOB: [birth year], 83 y.o.   MRN: [id number]  [place], MD   HPI   IOV 02/11/2017  Chief Complaint  Patient presents with  . Pulmonary Consult    Pt referred by Dr. Crissie Sickles for COPD. Pt c/o DOE. Pt deneis cough and CP/tightness.     83 year old female referred by Dr. Crissie Sickles electrophysiology for evaluation of dyspnea with associated walking desaturation positive test.  She presents with her husband. History is gained from talking to her, came and review and summarization of the referral notes. It appears she has been dyspneic for at least a few months insidious onset. Not necessarily progressive. Moderate in intensity. Present with exertion relieved by rest. Possible associated wheezing but no associated orthopnea paroxysmal nocturnal dyspnea. She has chronic edema that is unchanged. She has chronic osteoarthritis is unchanged. This no associated collagen vascular disease. Review of electrophysiology from 02/08/2017 and summarization shows that she had remote supraventricular tachycardia treated with ablation. Dr. physiology study in February 2018 showed left atrial flutter with RVR and status post direct cardioversion. And when she returns for follow-up with Dr. Lovena Le on 02/08/2017 walking desaturation test was positive and she was referred to pulmonary.  Echocardiogram 01/28/2017 shows left ventricle ejection fraction of 30-35 percent which is a significant drop from 2011 when it was 55-60 percent  Imaging: Last CT scan of the chest 01/13/2012: Personally visualized this was not a high-resolution CT chest. She has biapical mild scarring but otherwise unremarkable  May 2013 swallow study shows gastroesophageal reflux and feline esophagus  Results for CATHIA, ECKENRODE (MRN [id number]) as of 02/11/2017 10:42  Ref. Range 02/11/2015 14:15 02/14/2015 09:00 02/17/2015 10:54 02/17/2015 10:54 11/28/2015 10:33 12/16/2015 15:00  01/19/2017 16:20 01/28/2017 12:33  Creatinine Latest Ref Range: 0.57 - 1.00 mg/dL 0.71   0.7  0.59  0.69     Walking desat test 185 feet x 3 laps: with forehead probe: DID NOT DESAT below 99%  feno 02/11/2017 - > 34ppd and grey zone   Meds on apixaban and recent lisinopril start feb 2018 (there is hx of mild wheezing toio)   has a past medical history of Allergic rhinitis; Anemia; Breast cancer, left breast ([place]) (01/15/15); Chronic venous insufficiency; Hematuria; Hypertension; Migraine headache; MVP (mitral valve prolapse); PAF (paroxysmal atrial fibrillation) ([place]); SVT (supraventricular tachycardia) ([place]) (06/2010); and Wears glasses.   reports that she has never smoked. She has never used smokeless tobacco.  Past Surgical History:  Procedure Laterality Date  . A-FLUTTER ABLATION N/A 02/04/2017   Procedure: A-Flutter Ablation;  Surgeon: Evans Lance, MD;  Location: [facility];  Service: Cardiovascular;  Laterality: N/A;  . ABDOMINAL HYSTERECTOMY    . BREAST LUMPECTOMY WITH RADIOACTIVE SEED LOCALIZATION Left 02/14/2015   Procedure: BREAST LUMPECTOMY WITH RADIOACTIVE SEED LOCALIZATION;  Surgeon: Alphonsa Overall, MD;  Location: [facility];  Service: General;  Laterality: Left;  . BREAST SURGERY  1990   rt br bx  . CARDIAC CATHETERIZATION  2011   ablasion  . COLONOSCOPY    . DILATION AND CURETTAGE OF UTERUS    . Left Breast Biopsy  01/15/15  . TUBAL LIGATION      Allergies  Allergen Reactions  . Adhesive [Tape] Rash    Including EKG electrodes and the like.  . Codeine Other (See Comments)    unknown  . Latex Itching  . Percodan [Oxycodone-Aspirin] Nausea And Vomiting  Nausea     Immunization History  Administered Date(s) Administered  . Influenza Split 10/08/2009, 09/15/2010, 10/28/2011, 10/31/2012, 09/27/2013, 09/17/2015  . Influenza, High Dose Seasonal PF 09/06/2014  . Influenza,inj,Quad PF,36+ Mos 09/12/2016  . Pneumococcal Conjugate-13 09/06/2014  .  Pneumococcal Polysaccharide-23 09/12/2002  . Td 12/13/2005  . Zoster 08/14/2008    Family History  Problem Relation Age of Onset  . Hypertension Mother   . Heart disease Mother   . Arthritis Mother   . Other Father     hardening of the arteries  . Heart Problems Sister     pacemaker  . Heart attack Brother   . Throat cancer Paternal Uncle   . Breast cancer Sister     early 48s  . Alzheimer's disease Sister   . Pulmonary disease Sister   . Breast cancer Daughter 3    Negative genetic testing   . Breast cancer Paternal Aunt     dx over 1  . Cancer Paternal Aunt     cancer in her leg  . Breast cancer Cousin     multiple paternal cousin     Current Outpatient Prescriptions:  .  apixaban (ELIQUIS) 5 MG TABS tablet, Take 1 tablet (5 mg total) by mouth 2 (two) times daily., Disp: 60 tablet, Rfl: 1 .  Carboxymethylcellul-Glycerin (LUBRICATING EYE DROPS OP), Apply 1 drop to eye daily as needed (dry eyes)., Disp: , Rfl:  .  levothyroxine (SYNTHROID, LEVOTHROID) 75 MCG tablet, Take 75 mcg by mouth daily before breakfast., Disp: , Rfl:  .  lisinopril (PRINIVIL,ZESTRIL) 10 MG tablet, Take 1 tablet (10 mg total) by mouth daily., Disp: 30 tablet, Rfl: 0 .  metoprolol succinate (TOPROL-XL) 50 MG 24 hr tablet, Take 1 tablet (50 mg total) by mouth daily., Disp: 90 tablet, Rfl: 3 .  oxybutynin (DITROPAN) 5 MG tablet, Take 5 mg by mouth 2 (two) times daily., Disp: , Rfl:  .  pantoprazole (PROTONIX) 40 MG tablet, Take 40 mg by mouth daily. , Disp: , Rfl:  .  vitamin B-12 (CYANOCOBALAMIN) 100 MCG tablet, Take 100 mcg by mouth daily., Disp: , Rfl:  .  Vitamin D, Ergocalciferol, (DRISDOL) 50000 units CAPS capsule, Take 50,000 Units by mouth every 7 (seven) days., Disp: , Rfl:      Review of Systems  Constitutional: Negative for fever and unexpected weight change.  HENT: Negative for congestion, dental problem, ear pain, nosebleeds, postnasal drip, rhinorrhea, sinus pressure, sneezing, sore  throat and trouble swallowing.   Eyes: Negative for redness and itching.  Respiratory: Positive for shortness of breath. Negative for cough, chest tightness and wheezing.   Cardiovascular: Negative for palpitations and leg swelling.  Gastrointestinal: Negative for nausea and vomiting.  Genitourinary: Negative for dysuria.  Musculoskeletal: Negative for joint swelling.  Skin: Negative for rash.  Neurological: Negative for headaches.  Hematological: Does not bruise/bleed easily.  Psychiatric/Behavioral: Negative for dysphoric mood. The patient is not nervous/anxious.        Objective:   Physical Exam  Constitutional: She is oriented to person, place, and time. She appears well-developed and well-nourished. No distress.  HENT:  Head: Normocephalic and atraumatic.  Right Ear: External ear normal.  Left Ear: External ear normal.  Mouth/Throat: Oropharynx is clear and moist. No oropharyngeal exudate.  Eyes: Conjunctivae and EOM are normal. Pupils are equal, round, and reactive to light. Right eye exhibits no discharge. Left eye exhibits no discharge. No scleral icterus.  Neck: Normal range of motion. Neck supple. No JVD present. No  tracheal deviation present. No thyromegaly present.  Cardiovascular: Normal rate, regular rhythm, normal heart sounds and intact distal pulses.  Exam reveals no gallop and no friction rub.   No murmur heard. Pulmonary/Chest: Effort normal. No respiratory distress. She has no wheezes. She has rales. She exhibits no tenderness.  Possible crackles at lung base Somewhat kyphotic  Abdominal: Soft. Bowel sounds are normal. She exhibits no distension and no mass. There is no tenderness. There is no rebound and no guarding.  Mild visceral obesity +  Musculoskeletal: Normal range of motion. She exhibits no edema or tenderness.  Chronic ++ edema pedal Slow antalgic gait from years of OA  Lymphadenopathy:    She has no cervical adenopathy.  Neurological: She is alert  and oriented to person, place, and time. She has normal reflexes. No cranial nerve deficit. She exhibits normal muscle tone. Coordination normal.  Skin: Skin is warm and dry. No rash noted. She is not diaphoretic. No erythema. No pallor.  Psychiatric: She has a normal mood and affect. Her behavior is normal. Judgment and thought content normal.  Vitals reviewed.   Vitals:   02/11/17 1037  BP: 106/76  Pulse: 61  SpO2: 96%  Weight: 167 lb (75.8 kg)  Height: 5\' 8"  (1.727 m)          Assessment & Plan:  Dyspnea and respiratory abnormality Shortness of breath likely related to chronic systolic heart failure which is low ejection fraction week or that was seen in echocardiogram February 2018. The cardiologists are managing the situation with medications  I plan to ensure that there is no lung disease contributing to this shortness of breath.   The nitric oxide test was borderline and therefore you might have asthma; but lisinopril could be contirubting to wheeze  We do need to rule out scarring disease of the lung  PLAN - Do high resolution CT chest without contrast in the next few to several weeks  Follow-up - I will discuss with Dr Lovena Le about alternative to lisinopril - Return in the next few to several weeks but after completion of the high resolution CT chest. -Return to see myself Dr. Chase Caller our nurse practitioner to discuss these results   ICS an option at followu   Dr. Brand Males, M.D., [facility]C.P Pulmonary and Critical Care Medicine Staff Physician [place] [facility] Pager: [phone], If no answer or between  15:00h - 7:00h: call [phone]  02/11/2017 11:16 AM

## 2017-02-15 NOTE — Telephone Encounter (Signed)
I am not sure how to adjudicate this from the saturation perspective. Her pulse ox went into the 70's when I walked her then back to the 90's with rest and appeared to correlate with her HR. CT scan should be helpful. Thanks. GT

## 2017-02-15 NOTE — Telephone Encounter (Signed)
We are increasingly using forehead probes due to poor circulation in this age group. Just today saw a copd patient whose finger pulse went down to 80s but forehead probe was 90s. I wil keep you updated on the HRCT.   Dr. Brand Males, M.D., [facility].C.P Pulmonary and Critical Care Medicine Staff Physician [place] [facility] Pager: [phone], If no answer or between  15:00h - 7:00h: call [phone]  02/15/2017 1:04 PM

## 2017-02-17 ENCOUNTER — Ambulatory Visit (INDEPENDENT_AMBULATORY_CARE_PROVIDER_SITE_OTHER)
Admission: RE | Admit: 2017-02-17 | Discharge: 2017-02-17 | Disposition: A | Discharge status: Home / Self Care | Payer: Medicare Other | Source: Ambulatory Visit | Attending: Internal Medicine | Admitting: Internal Medicine

## 2017-02-17 DIAGNOSIS — R0689 Other abnormalities of breathing: Secondary | ICD-10-CM | POA: Diagnosis not present

## 2017-02-17 DIAGNOSIS — R0600 Dyspnea, unspecified: Secondary | ICD-10-CM | POA: Diagnosis not present

## 2017-02-18 ENCOUNTER — Telehealth: Payer: Self-pay | Admitting: Internal Medicine

## 2017-02-18 NOTE — Telephone Encounter (Signed)
Just seeing this - the respiratory dept closes at 4pm and pt's appt is on 3.12.18 Monday at 9:45, unable to get a PFT prior to appt.

## 2017-02-18 NOTE — Telephone Encounter (Signed)
Daneil Dan : [name] is seeng TP on 02/21/17 - there is mild ILD on CT but no change sinc 2013. Please try to get Pre-bd spiro and dlco only. No lung volume or bd response on or before 02/21/17 before she sees TP  TP: please consider ICS for her mild air trapping seen on CT along with mild elevation in feNO at last visit. In addition, please communicate to consider changing ACE to ARB. Folllowup should be in 3-4 months  Thanks  Dr. Brand Males, M.D., [facility].C.P Pulmonary and Critical Care Medicine Staff Physician [place] [facility] Pager: [phone], If no answer or between  15:00h - 7:00h: call [phone]  02/18/2017 5:58 AM        Ct Chest High Resolution  Result Date: 02/17/2017 CLINICAL DATA:  Dyspnea on exertion . EXAM: CT CHEST WITHOUT CONTRAST TECHNIQUE: Multidetector CT imaging of the chest was performed following the standard protocol without intravenous contrast. High resolution imaging of the lungs, as well as inspiratory and expiratory imaging, was performed. COMPARISON:  01/13/2012 chest CT. FINDINGS: Partially motion degraded scan. Cardiovascular: Mild cardiomegaly. No significant pericardial fluid/thickening. Mildly atherosclerotic thoracic aorta with 4.3 cm ascending thoracic aortic aneurysm, increased from 4.0 cm on 01/13/2012. Normal caliber pulmonary arteries. Mediastinum/Nodes: No discrete thyroid nodules. Unremarkable esophagus. No axillary adenopathy. Mildly enlarged 1.1 cm right lower paratracheal node (series 2/ image 54), stable since 01/13/2012. No additional pathologically enlarged mediastinal or gross hilar nodes on this noncontrast scan. Lungs/Pleura: No pneumothorax. Trace dependent bilateral pleural effusions. No acute consolidative airspace disease, lung masses or significant pulmonary nodules. There is mild patchy subpleural reticulation and mild heterogeneous ground-glass attenuation throughout both lungs, without a  clear basilar gradient and without significant traction bronchiectasis or frank honeycombing. These findings have not convincingly changed back to the 01/13/2012. There are scattered stable small subpleural bandlike opacities with associated mild distortion at both lung apices and in the superior segments of the lower lobes and basilar lingula and right middle lobe, most compatible with postinfectious/ postinflammatory scarring. Mild patchy air trapping in both lungs on the expiration sequence. Upper abdomen: Unremarkable. Musculoskeletal: No aggressive appearing focal osseous lesions. Soft tissue anchors are seen in the posterior left humeral head. Moderate thoracic spondylosis. IMPRESSION: 1. Mild patchy subpleural reticulation and mild heterogeneous ground-glass attenuation throughout both lungs, without a clear basilar gradient. No significant traction bronchiectasis. No frank honeycombing. No appreciable interval change since 01/13/2012 chest CT. Findings may indicate an interstitial lung disease such as nonspecific interstitial pneumonia (NSIP). Findings are not compatible with usual interstitial pneumonia (UIP) . 2. Mild patchy air trapping in both lungs, compatible with small airways disease. 3. Mild cardiomegaly.  Trace dependent bilateral pleural effusions. 4. Mild mediastinal lymphadenopathy is stable since 2013, compatible with benign reactive adenopathy. 5. Aortic atherosclerosis. Ascending thoracic aortic 4.3 cm aneurysm, mildly increased. Recommend annual imaging followup by CTA or MRA. This recommendation follows 2010 ACCF/AHA/AATS/ACR/ASA/SCA/SCAI/SIR/STS/SVM Guidelines for the Diagnosis and Management of Patients with Thoracic Aortic Disease. Circulation. 2010; 121: [id number]. Electronically Signed   By: Ilona Sorrel M.D.   On: 02/17/2017 10:31

## 2017-02-21 ENCOUNTER — Encounter: Payer: Self-pay | Admitting: Adult Health

## 2017-02-21 ENCOUNTER — Ambulatory Visit (INDEPENDENT_AMBULATORY_CARE_PROVIDER_SITE_OTHER): Payer: Medicare Other | Admitting: Adult Health

## 2017-02-21 DIAGNOSIS — I5022 Chronic systolic (congestive) heart failure: Secondary | ICD-10-CM

## 2017-02-21 DIAGNOSIS — R0600 Dyspnea, unspecified: Secondary | ICD-10-CM

## 2017-02-21 DIAGNOSIS — J8489 Other specified interstitial pulmonary diseases: Secondary | ICD-10-CM | POA: Diagnosis not present

## 2017-02-21 DIAGNOSIS — R0689 Other abnormalities of breathing: Secondary | ICD-10-CM | POA: Diagnosis not present

## 2017-02-21 DIAGNOSIS — I509 Heart failure, unspecified: Secondary | ICD-10-CM | POA: Insufficient documentation

## 2017-02-21 DIAGNOSIS — J849 Interstitial pulmonary disease, unspecified: Secondary | ICD-10-CM | POA: Insufficient documentation

## 2017-02-21 NOTE — Addendum Note (Signed)
Addended by: Parke Poisson E on: 02/21/2017 11:39 AM   Modules accepted: Orders

## 2017-02-21 NOTE — Assessment & Plan Note (Signed)
Mild scarring in lung ?c/w NSIP . CT stable since 2013  Check PFT

## 2017-02-21 NOTE — Progress Notes (Signed)
@Patient  ID: [name], female    DOB: 02-27-1933, 83 y.o.   MRN: [id number]  Chief Complaint  Patient presents with  . Follow-up    Dyspnea     Referring provider: Lajean Manes, MD  HPI: 83 year old female never smoker seen for pulmonary consult 02/11/2017 for dyspnea with exertion and exertional hypoxemia. Past medical history of A Fib, Systolic CHF   TEST /Events  Walking desat test 185 feet x 3 laps: with forehead probe: DID NOT DESAT below 99%  feno 02/11/2017 - > 34ppd and grey zone  HRCT Chest 02/17/17 >Mild patchy subpleural reticulation and mild heterogeneous ground-glass attenuation throughout both lungs, without a clear basilar gradient. No significant traction bronchiectasis. No frank honeycombing. No appreciable interval change since 01/13/2012 chest CT. Findings may indicate an interstitial lung disease such as nonspecific interstitial pneumonia (NSIP). Findings are not compatible with usual interstitial pneumonia (UIP) . 2. Mild patchy air trapping in both lungs, compatible with small airways disease. 3. Mild cardiomegaly.  Trace dependent bilateral pleural effusions. 4. Mild mediastinal lymphadenopathy is stable since 2013, compatible with benign reactive adenopathy.  02/21/2017 Follow up : DOE Patient presents for a one-week follow-up. Patient was seen last visit for a pulmonary consult for dyspnea with exertion and exertional hypoxemia noted at a cardiology appointment. Patient is followed for systolic congestive heart failure and A Fib . Echo last month showed significant drop in EF at 30-35% (from EF 60% 2011) . A repeat walk test in the pulmonary office showed no  oxygen desaturations. She was set up for a high resolution CT chest. This showed mild patchy subpleural reticulation and groundglass attenuation throughout both lungs. There was no significant traction bronchiectasis or honeycombing. There was no significant change since 2013 comparison to a chest  CT. I went over these results with her and her husband.  She underwent cardioversion last month for A fib /Flutter. Metoprolol was increased and Lisinopril was added.  She says she is feeling much better. Says her dyspnea has decreased . Getting her energy level back.   Never smoker , homemaker . No extensive travel . (Lived in [place] for couple of years in 1960s) . From [place] . No unusal hobbies . No amiodarone or Macrobid use.  No chemo /Radiation. Exposure .    Allergies  Allergen Reactions  . Adhesive [Tape] Rash    Including EKG electrodes and the like.  . Codeine Other (See Comments)    unknown  . Latex Itching  . Percodan [Oxycodone-Aspirin] Nausea And Vomiting    Nausea     Immunization History  Administered Date(s) Administered  . Influenza Split 10/08/2009, 09/15/2010, 10/28/2011, 10/31/2012, 09/27/2013, 09/17/2015  . Influenza, High Dose Seasonal PF 09/06/2014  . Influenza,inj,Quad PF,36+ Mos 09/12/2016  . Pneumococcal Conjugate-13 09/06/2014  . Pneumococcal Polysaccharide-23 09/12/2002  . Td 12/13/2005  . Zoster 08/14/2008    Past Medical History:  Diagnosis Date  . Allergic rhinitis   . Anemia   . Breast cancer, left breast ([place]) 01/15/15   Invasive Mammary  . Chronic venous insufficiency   . Hematuria    negative workup - Dr. Reece Agar  . Hypertension   . Migraine headache   . MVP (mitral valve prolapse)   . PAF (paroxysmal atrial fibrillation) ([place])   . SVT (supraventricular tachycardia) ([place]) 06/2010   s/p AVNRT ablation  . Wears glasses     Tobacco History: History  Smoking Status  . Never Smoker  Smokeless Tobacco  . Never Used  Counseling given: Not Answered   Outpatient Encounter Prescriptions as of 02/21/2017  Medication Sig  . apixaban (ELIQUIS) 5 MG TABS tablet Take 1 tablet (5 mg total) by mouth 2 (two) times daily.  . Carboxymethylcellul-Glycerin (LUBRICATING EYE DROPS OP) Apply 1 drop to eye daily as needed (dry eyes).  Marland Kitchen  levothyroxine (SYNTHROID, LEVOTHROID) 75 MCG tablet Take 75 mcg by mouth daily before breakfast.  . lisinopril (PRINIVIL,ZESTRIL) 10 MG tablet Take 1 tablet (10 mg total) by mouth daily.  . metoprolol succinate (TOPROL-XL) 50 MG 24 hr tablet Take 1 tablet (50 mg total) by mouth daily.  Marland Kitchen oxybutynin (DITROPAN) 5 MG tablet Take 5 mg by mouth 2 (two) times daily.  . pantoprazole (PROTONIX) 40 MG tablet Take 40 mg by mouth daily.   . vitamin B-12 (CYANOCOBALAMIN) 100 MCG tablet Take 100 mcg by mouth daily.  . Vitamin D, Ergocalciferol, (DRISDOL) 50000 units CAPS capsule Take 50,000 Units by mouth every 7 (seven) days.   No facility-administered encounter medications on file as of 02/21/2017.      Review of Systems  Constitutional:   No  weight loss, night sweats,  Fevers, chills, + fatigue, or  lassitude.  HEENT:   No headaches,  Difficulty swallowing,  Tooth/dental problems, or  Sore throat,                No sneezing, itching, ear ache, nasal congestion, post nasal drip,   CV:  No chest pain,  Orthopnea, PND, swelling in lower extremities, anasarca, dizziness, palpitations, syncope.   GI  No heartburn, indigestion, abdominal pain, nausea, vomiting, diarrhea, change in bowel habits, loss of appetite, bloody stools.   Resp:    No excess mucus, no productive cough,  No non-productive cough,  No coughing up of blood.  No change in color of mucus.  No wheezing.  No chest wall deformity  Skin: no rash or lesions.  GU: no dysuria, change in color of urine, no urgency or frequency.  No flank pain, no hematuria   MS:  No joint pain or swelling.  No decreased range of motion.  No back pain.    Physical Exam  BP 128/70 (BP Location: Left Arm, Cuff Size: Normal)   Pulse (!) 55   Ht 5\' 8"  (1.727 m)   Wt 165 lb 3.2 oz (74.9 kg)   SpO2 97%   BMI 25.12 kg/m   GEN: A/Ox3; pleasant , NAD, elderly    HEENT:  [place]/AT,  EACs-clear, TMs-wnl, NOSE-clear, THROAT-clear, no lesions, no postnasal drip  or exudate noted.   NECK:  Supple w/ fair ROM; no JVD; normal carotid impulses w/o bruits; no thyromegaly or nodules palpated; no lymphadenopathy.    RESP  Clear  P & A; w/o, wheezes/ rales/ or rhonchi. no accessory muscle use, no dullness to percussion  CARD:  RRR, no m/r/g, tr  peripheral edema, pulses intact, no cyanosis or clubbing.  GI:   Soft & nt; nml bowel sounds; no organomegaly or masses detected.   Musco: Warm bil, no deformities or joint swelling noted.   Neuro: alert, no focal deficits noted.    Skin: Warm, no lesions or rashes    Lab Results:    BNP No results found for: BNP  ProBNP No results found for: PROBNP  Imaging: Ct Chest High Resolution  Result Date: 02/17/2017 CLINICAL DATA:  Dyspnea on exertion . EXAM: CT CHEST WITHOUT CONTRAST TECHNIQUE: Multidetector CT imaging of the chest was performed following the standard protocol without intravenous  contrast. High resolution imaging of the lungs, as well as inspiratory and expiratory imaging, was performed. COMPARISON:  01/13/2012 chest CT. FINDINGS: Partially motion degraded scan. Cardiovascular: Mild cardiomegaly. No significant pericardial fluid/thickening. Mildly atherosclerotic thoracic aorta with 4.3 cm ascending thoracic aortic aneurysm, increased from 4.0 cm on 01/13/2012. Normal caliber pulmonary arteries. Mediastinum/Nodes: No discrete thyroid nodules. Unremarkable esophagus. No axillary adenopathy. Mildly enlarged 1.1 cm right lower paratracheal node (series 2/ image 54), stable since 01/13/2012. No additional pathologically enlarged mediastinal or gross hilar nodes on this noncontrast scan. Lungs/Pleura: No pneumothorax. Trace dependent bilateral pleural effusions. No acute consolidative airspace disease, lung masses or significant pulmonary nodules. There is mild patchy subpleural reticulation and mild heterogeneous ground-glass attenuation throughout both lungs, without a clear basilar gradient and without  significant traction bronchiectasis or frank honeycombing. These findings have not convincingly changed back to the 01/13/2012. There are scattered stable small subpleural bandlike opacities with associated mild distortion at both lung apices and in the superior segments of the lower lobes and basilar lingula and right middle lobe, most compatible with postinfectious/ postinflammatory scarring. Mild patchy air trapping in both lungs on the expiration sequence. Upper abdomen: Unremarkable. Musculoskeletal: No aggressive appearing focal osseous lesions. Soft tissue anchors are seen in the posterior left humeral head. Moderate thoracic spondylosis. IMPRESSION: 1. Mild patchy subpleural reticulation and mild heterogeneous ground-glass attenuation throughout both lungs, without a clear basilar gradient. No significant traction bronchiectasis. No frank honeycombing. No appreciable interval change since 01/13/2012 chest CT. Findings may indicate an interstitial lung disease such as nonspecific interstitial pneumonia (NSIP). Findings are not compatible with usual interstitial pneumonia (UIP) . 2. Mild patchy air trapping in both lungs, compatible with small airways disease. 3. Mild cardiomegaly.  Trace dependent bilateral pleural effusions. 4. Mild mediastinal lymphadenopathy is stable since 2013, compatible with benign reactive adenopathy. 5. Aortic atherosclerosis. Ascending thoracic aortic 4.3 cm aneurysm, mildly increased. Recommend annual imaging followup by CTA or MRA. This recommendation follows 2010 ACCF/AHA/AATS/ACR/ASA/SCA/SCAI/SIR/STS/SVM Guidelines for the Diagnosis and Management of Patients with Thoracic Aortic Disease. Circulation. 2010; 121: K553-Z482. Electronically Signed   By: Ilona Sorrel M.D.   On: 02/17/2017 10:31     Assessment & Plan:   Dyspnea and respiratory abnormality CT Chest does show some mild scarring /?NSIP but stable for last 5 years.  Would check PFT w/ DLCO .  Suspect most of her  dyspnea was from new onset Systolic CHF and A Flutter/Fib  that is improved since cardioversion and addition of ACE and increased BB dose.   Plan  Patient Instructions  Continue on current regimen .  follow up Dr. Chase Caller in 3 months with PFT .  Please contact office for sooner follow up if symptoms do not improve or worsen or seek emergency care         Rexene Edison, NP 02/21/2017

## 2017-02-21 NOTE — Patient Instructions (Signed)
Continue on current regimen .  follow up Dr. Chase Caller in 3 months with PFT .  Please contact office for sooner follow up if symptoms do not improve or worsen or seek emergency care

## 2017-02-21 NOTE — Assessment & Plan Note (Signed)
Appears to be compensated and improved sx control on current regimen

## 2017-02-21 NOTE — Assessment & Plan Note (Signed)
CT Chest does show some mild scarring /?NSIP but stable for last 5 years.  Would check PFT w/ DLCO .  Suspect most of her dyspnea was from new onset Systolic CHF and A Flutter/Fib  that is improved since cardioversion and addition of ACE and increased BB dose.   Plan  Patient Instructions  Continue on current regimen .  follow up Dr. Chase Caller in 3 months with PFT .  Please contact office for sooner follow up if symptoms do not improve or worsen or seek emergency care

## 2017-02-23 NOTE — Telephone Encounter (Signed)
TP has already seen patient  Dr. Brand Males, M.D., [facility].C.P Pulmonary and Critical Care Medicine Staff Physician [facility] Pager: [phone], If no answer or between  15:00h - 7:00h: call [phone]  02/23/2017 5:19 PM

## 2017-02-24 ENCOUNTER — Other Ambulatory Visit: Payer: Self-pay | Admitting: Physician Assistant

## 2017-03-01 ENCOUNTER — Ambulatory Visit: Payer: Medicare Other | Admitting: Internal Medicine

## 2017-04-12 ENCOUNTER — Ambulatory Visit (INDEPENDENT_AMBULATORY_CARE_PROVIDER_SITE_OTHER): Payer: Medicare Other | Admitting: Internal Medicine

## 2017-04-12 ENCOUNTER — Encounter: Payer: Self-pay | Admitting: Internal Medicine

## 2017-04-12 ENCOUNTER — Other Ambulatory Visit: Payer: Self-pay | Admitting: Physician Assistant

## 2017-04-12 VITALS — BP 152/90 | HR 53 | Ht 69.0 in | Wt 165.0 lb

## 2017-04-12 DIAGNOSIS — I10 Essential (primary) hypertension: Secondary | ICD-10-CM | POA: Diagnosis not present

## 2017-04-12 DIAGNOSIS — I4892 Unspecified atrial flutter: Secondary | ICD-10-CM | POA: Diagnosis not present

## 2017-04-12 MED ORDER — LOSARTAN POTASSIUM 50 MG PO TABS: 50.0000 mg | ORAL_TABLET | Freq: Every day | ORAL | 3 refills | Status: DC

## 2017-04-12 NOTE — Progress Notes (Signed)
HPI [name] returns today for followup. She is a pleasant 83 yo woman with a h/o SVT who underwent ablation remotely who was found to have developed atrial flutter and underwent EP study was found to have left atrial (likely mitral annular reentry) flutter with a RVR and underwent DCCV. Ablation was not attempted.She also had inducible AVNRT at a slower rate and evidence of AV conduction system disease. She was discharged home. She returns today for followup. In the interim, she has been noted to have worsening LV dysfunction, desaturation which was not reproducible, and is now better. She notes that her dyspnea is much better. She had a CT scan demonstrating scarring and PFT's are pending. Allergies  Allergen Reactions  . Adhesive [Tape] Rash    Including EKG electrodes and the like.  . Codeine Other (See Comments)    unknown  . Latex Itching  . Percodan [Oxycodone-Aspirin] Nausea And Vomiting    Nausea      Current Outpatient Prescriptions  Medication Sig Dispense Refill  . apixaban (ELIQUIS) 5 MG TABS tablet Take 1 tablet (5 mg total) by mouth 2 (two) times daily. 60 tablet 1  . Carboxymethylcellul-Glycerin (LUBRICATING EYE DROPS OP) Apply 1 drop to eye daily as needed (dry eyes).    Marland Kitchen levothyroxine (SYNTHROID, LEVOTHROID) 75 MCG tablet Take 75 mcg by mouth daily before breakfast.    . lisinopril (PRINIVIL,ZESTRIL) 10 MG tablet Take 1 tablet (10 mg total) by mouth daily. 30 tablet 11  . metoprolol succinate (TOPROL-XL) 50 MG 24 hr tablet Take 1 tablet (50 mg total) by mouth daily. 90 tablet 3  . oxybutynin (DITROPAN) 5 MG tablet Take 5 mg by mouth 2 (two) times daily.    . pantoprazole (PROTONIX) 40 MG tablet Take 40 mg by mouth daily.     . vitamin B-12 (CYANOCOBALAMIN) 100 MCG tablet Take 100 mcg by mouth daily.    . Vitamin D, Ergocalciferol, (DRISDOL) 50000 units CAPS capsule Take 50,000 Units by mouth every 7 (seven) days.     No current facility-administered  medications for this visit.      Past Medical History:  Diagnosis Date  . Allergic rhinitis   . Anemia   . Breast cancer, left breast ([place]) 01/15/15   Invasive Mammary  . Chronic venous insufficiency   . Hematuria    negative workup - Dr. Reece Agar  . Hypertension   . Migraine headache   . MVP (mitral valve prolapse)   . PAF (paroxysmal atrial fibrillation) ([place])   . SVT (supraventricular tachycardia) ([facility] [place]) 06/2010   s/p AVNRT ablation  . Wears glasses     ROS:   All systems reviewed and negative except as noted in the HPI.   Past Surgical History:  Procedure Laterality Date  . A-FLUTTER ABLATION N/A 02/04/2017   Procedure: A-Flutter Ablation;  Surgeon: Evans Lance, MD;  Location: [facility];  Service: Cardiovascular;  Laterality: N/A;  . ABDOMINAL HYSTERECTOMY    . BREAST LUMPECTOMY WITH RADIOACTIVE SEED LOCALIZATION Left 02/14/2015   Procedure: BREAST LUMPECTOMY WITH RADIOACTIVE SEED LOCALIZATION;  Surgeon: Alphonsa Overall, MD;  Location: [facility];  Service: General;  Laterality: Left;  . BREAST SURGERY  1990   rt br bx  . CARDIAC CATHETERIZATION  2011   ablasion  . COLONOSCOPY    . DILATION AND CURETTAGE OF UTERUS    . Left Breast Biopsy  01/15/15  . TUBAL LIGATION       Family  History  Problem Relation Age of Onset  . Hypertension Mother   . Heart disease Mother   . Arthritis Mother   . Other Father     hardening of the arteries  . Heart Problems Sister     pacemaker  . Heart attack Brother   . Throat cancer Paternal Uncle   . Breast cancer Sister     early 67s  . Alzheimer's disease Sister   . Pulmonary disease Sister   . Breast cancer Daughter 22    Negative genetic testing   . Breast cancer Paternal Aunt     dx over 89  . Cancer Paternal Aunt     cancer in her leg  . Breast cancer Cousin     multiple paternal cousin     Social History   Social History  . Marital status: Married    Spouse name: N/A  . Number of  children: 3  . Years of education: N/A   Occupational History  . Not on file.   Social History Main Topics  . Smoking status: Never Smoker  . Smokeless tobacco: Never Used  . Alcohol use 0.0 oz/week     Comment: once a week  . Drug use: No  . Sexual activity: Not on file   Other Topics Concern  . Not on file   Social History Narrative   LIves with husband.    Retired.      BP (!) 152/90 (BP Location: Left Arm)   Pulse (!) 53   Ht 5\' 9"  (1.753 m)   Wt 165 lb (74.8 kg)   BMI 24.37 kg/m   Physical Exam:  Well appearing elderly woman, NAD HEENT: Unremarkable Neck:  6 cm JVD, no thyromegally Lymphatics:  No adenopathy Back:  No CVA tenderness Lungs:  Clear with no wheezes HEART:  Regular rate rhythm, no murmurs, no rubs, no clicks Abd:  soft, positive bowel sounds, no organomegally, no rebound, no guarding Ext:  2 plus pulses, no edema, no cyanosis, no clubbing Skin:  No rashes no nodules Neuro:  CN II through XII intact, motor grossly intact  ECG - sinus bradycardia with first degree AV block   Assess/Plan: 1. Dyspnea - today she is improved. She has undergone CT scanning. She did not desaturate in the pulmonary clinic though she has some scarring on CT. I have asked her to switch to losartan from lisinopril. 2. Atrial flutter - she is currently maintaining NSR.  3. Breast CA - in remission.  4. HTN - her blood pressure is mostly well controlled. Will follow. 5. LV dysfunction - I anticipate repeating her 2D echo when I see the patient back in followup.  Mikle Bosworth.D.

## 2017-04-12 NOTE — Patient Instructions (Signed)
Medication Instructions:  Your physician has recommended you make the following change in your medication:  Stop Lisinopril. Start Cozaar 50 mg by mouth daily.    Labwork: Your physician recommends that you return for lab work in: 2 weeks.  BMP.  The lab opens at 7:30 Am   Testing/Procedures: none  Follow-Up: Your physician recommends that you schedule a follow-up appointment in: 4-5 months with Dr. Lovena Le    Any Other Special Instructions Will Be Listed Below (If Applicable).     If you need a refill on your cardiac medications before your next appointment, please call your pharmacy.

## 2017-04-13 NOTE — Telephone Encounter (Signed)
Request received for Eliquis 5mg ; pt is 83 yrs old, wt-74.8kg, Crea-0.69 on 01/28/17, last seen by Dr. Lovena Le on 04/12/17. Will send in requested refill to requested pharmacy.

## 2017-04-27 ENCOUNTER — Other Ambulatory Visit: Payer: Medicare Other | Admitting: *Deleted

## 2017-04-27 DIAGNOSIS — I10 Essential (primary) hypertension: Secondary | ICD-10-CM

## 2017-04-28 LAB — BASIC METABOLIC PANEL
BUN/Creatinine Ratio: 19 (ref 12–28)
BUN: 15 mg/dL (ref 8–27)
CALCIUM: 9 mg/dL (ref 8.7–10.3)
CHLORIDE: 104 mmol/L (ref 96–106)
CO2: 19 mmol/L (ref 18–29)
Creatinine, Ser: 0.78 mg/dL (ref 0.57–1.00)
GFR calc Af Amer: 82 mL/min/{1.73_m2} (ref >59)
GFR calc non Af Amer: 71 mL/min/{1.73_m2} (ref >59)
Glucose: 75 mg/dL (ref 65–99)
POTASSIUM: 4.6 mmol/L (ref 3.5–5.2)
SODIUM: 139 mmol/L (ref 134–144)

## 2017-05-17 ENCOUNTER — Encounter: Payer: Self-pay | Admitting: Internal Medicine

## 2017-05-17 ENCOUNTER — Ambulatory Visit (INDEPENDENT_AMBULATORY_CARE_PROVIDER_SITE_OTHER): Payer: Medicare Other | Admitting: Internal Medicine

## 2017-05-17 ENCOUNTER — Other Ambulatory Visit (INDEPENDENT_AMBULATORY_CARE_PROVIDER_SITE_OTHER): Payer: Medicare Other

## 2017-05-17 DIAGNOSIS — J849 Interstitial pulmonary disease, unspecified: Secondary | ICD-10-CM | POA: Diagnosis not present

## 2017-05-17 DIAGNOSIS — J8489 Other specified interstitial pulmonary diseases: Secondary | ICD-10-CM

## 2017-05-17 DIAGNOSIS — R0600 Dyspnea, unspecified: Secondary | ICD-10-CM | POA: Diagnosis not present

## 2017-05-17 DIAGNOSIS — R0689 Other abnormalities of breathing: Secondary | ICD-10-CM

## 2017-05-17 LAB — PULMONARY FUNCTION TEST
DL/VA % pred: 75 %
DL/VA: 3.93 ml/min/mmHg/L
DLCO COR: 15.99 ml/min/mmHg
DLCO UNC % PRED: 51 %
DLCO UNC: 14.92 ml/min/mmHg
DLCO cor % pred: 55 %
FEF 25-75 PRE: 1 L/s
FEF 25-75 Post: 1.06 L/sec
FEF2575-%Change-Post: 6 %
FEF2575-%PRED-PRE: 66 %
FEF2575-%Pred-Post: 70 %
FEV1-%Change-Post: 1 %
FEV1-%PRED-PRE: 66 %
FEV1-%Pred-Post: 67 %
FEV1-POST: 1.48 L
FEV1-Pre: 1.46 L
FEV1FVC-%CHANGE-POST: 5 %
FEV1FVC-%Pred-Pre: 99 %
FEV6-%CHANGE-POST: -3 %
FEV6-%PRED-PRE: 71 %
FEV6-%Pred-Post: 69 %
FEV6-POST: 1.93 L
FEV6-Pre: 1.99 L
FEV6FVC-%Change-Post: 0 %
FEV6FVC-%PRED-POST: 105 %
FEV6FVC-%Pred-Pre: 105 %
FVC-%Change-Post: -3 %
FVC-%Pred-Post: 65 %
FVC-%Pred-Pre: 67 %
FVC-Post: 1.93 L
FVC-Pre: 2 L
POST FEV6/FVC RATIO: 100 %
PRE FEV1/FVC RATIO: 73 %
Post FEV1/FVC ratio: 77 %
Pre FEV6/FVC Ratio: 100 %
RV % pred: 105 %
RV: 2.75 L
TLC % PRED: 77 %
TLC: 4.31 L

## 2017-05-17 LAB — SEDIMENTATION RATE: SED RATE: 25 mm/h (ref 0–30)

## 2017-05-17 NOTE — Progress Notes (Signed)
Subjective:     Patient ID: [name], female   DOB: June 24, 1933, 83 y.o.   MRN: [id number]  HPI    PPC [name], MD   HPI   IOV 02/11/2017  Chief Complaint  Patient presents with  . Pulmonary Consult    Pt referred by Dr. Crissie Sickles for COPD. Pt c/o DOE. Pt deneis cough and CP/tightness.     83 year old female referred by Dr. Crissie Sickles electrophysiology for evaluation of dyspnea with associated walking desaturation positive test.  She presents with her husband. History is gained from talking to her, came and review and summarization of the referral notes. It appears she has been dyspneic for at least a few months insidious onset. Not necessarily progressive. Moderate in intensity. Present with exertion relieved by rest. Possible associated wheezing but no associated orthopnea paroxysmal nocturnal dyspnea. She has chronic edema that is unchanged. She has chronic osteoarthritis is unchanged. This no associated collagen vascular disease. Review of electrophysiology from 02/08/2017 and summarization shows that she had remote supraventricular tachycardia treated with ablation. Dr. physiology study in February 2018 showed left atrial flutter with RVR and status post direct cardioversion. And when she returns for follow-up with Dr. Lovena Le on 02/08/2017 walking desaturation test was positive and she was referred to pulmonary.  Echocardiogram 01/28/2017 shows left ventricle ejection fraction of 30-35 percent which is a significant drop from 2011 when it was 55-60 percent  Imaging: Last CT scan of the chest 01/13/2012: Personally visualized this was not a high-resolution CT chest. She has biapical mild scarring but otherwise unremarkable  May 2013 swallow study shows gastroesophageal reflux and feline esophagus    Walking desat test 185 feet x 3 laps: with forehead probe: DID NOT DESAT below 99%  feno 02/11/2017 - > 34ppd and grey zone   Meds on apixaban and recent  lisinopril start feb 2018 (there is hx of mild wheezing toio)    02/21/2017 Follow up : DOE Patient presents for a one-week follow-up. Patient was seen last visit for a pulmonary consult for dyspnea with exertion and exertional hypoxemia noted at a cardiology appointment. Patient is followed for systolic congestive heart failure and A Fib . Echo last month showed significant drop in EF at 30-35% (from EF 60% 2011) . A repeat walk test in the pulmonary office showed no  oxygen desaturations. She was set up for a high resolution CT chest. This showed mild patchy subpleural reticulation and groundglass attenuation throughout both lungs. There was no significant traction bronchiectasis or honeycombing. There was no significant change since 2013 comparison to a chest CT. I went over these results with her and her husband.  She underwent cardioversion last month for A fib /Flutter. Metoprolol was increased and Lisinopril was added.  She says she is feeling much better. Says her dyspnea has decreased . Getting her energy level back.   Never smoker , homemaker . No extensive travel . (Lived in [place] for couple of years in 1960s) . From [place] . No unusal hobbies . No amiodarone or Macrobid use.  No chemo /Radiation. Exposure   .  IMPRESSION: CT chest 02/17/17 1. Mild patchy subpleural reticulation and mild heterogeneous ground-glass attenuation throughout both lungs, without a clear basilar gradient. No significant traction bronchiectasis. No frank honeycombing. No appreciable interval change since 01/13/2012 chest CT. Findings may indicate an interstitial lung disease such as nonspecific interstitial pneumonia (NSIP). Findings are not compatible with usual interstitial pneumonia (UIP) . 2. Mild patchy air trapping  in both lungs, compatible with small airways disease. 3. Mild cardiomegaly.  Trace dependent bilateral pleural effusions. 4. Mild mediastinal lymphadenopathy is stable since 2013,  compatible with benign reactive adenopathy. 5. Aortic atherosclerosis. Ascending thoracic aortic 4.3 cm aneurysm, mildly increased. Recommend annual imaging followup by CTA or MRA. This recommendation follows 2010 ACCF/AHA/AATS/ACR/ASA/SCA/SCAI/SIR/STS/SVM Guidelines for the Diagnosis and Management of Patients with Thoracic Aortic Disease. Circulation. 2010; 121: X412-I786.   Electronically Signed   By: Ilona Sorrel M.D.   On: 02/17/2017 10:31   OV 05/17/2017  Chief Complaint  Patient presents with  . Follow-up    review PFT.  pt denies any current breathing complaints.     Follow-up dyspnea on exertion in the setting of new onset chronic systolic heart failure. Has a history of desaturation the cardiology office that prompted this referral. At my first intake visit with her in March 2018 her exhaled nitric oxide was slightly high we had a lisinopril stopped. After that she underwent cardioversion for atrial fibrillation. With all this her dyspnea has indeed improved. She did have a high-resolution CT chest in March 2018 that suggested possible presence of ILD. She is followed that up with today with pulmonary function test that suggest restriction with a low DLCO again consistent with ILD. She is a lifelong nonsmoker. In the past she did not desaturate walking in our office. She denies any collagen-vascular symptoms. She does have some crackles on the exam today which are faint though they're not classic for ILD    Results for MAVIS, GRAVELLE (MRN [id number]) as of 05/17/2017 12:50  Ref. Range 05/17/2017 11:00  FVC-Pre Latest Units: L 2.00  FVC-%Pred-Pre Latest Units: % 67  FEV1-Pre Latest Units: L 1.46  FEV1-%Pred-Pre Latest Units: % 66  Pre FEV1/FVC ratio Latest Units: % 73  Results for SADIA, BELFIORE (MRN [id number]) as of 05/17/2017 12:50  Ref. Range 05/17/2017 11:00  TLC Latest Units: L 4.31  TLC % pred Latest Units: % 77  Results for LAM, BJORKLUND (MRN [id number]) as of 05/17/2017  12:50  Ref. Range 05/17/2017 11:00  DLCO cor Latest Units: ml/min/mmHg 15.99  DLCO cor % pred Latest Units: % 55     has a past medical history of Allergic rhinitis; Anemia; Breast cancer, left breast ([place]) (01/15/15); Chronic venous insufficiency; Hematuria; Hypertension; Migraine headache; MVP (mitral valve prolapse); PAF (paroxysmal atrial fibrillation) ([place]); SVT (supraventricular tachycardia) ([place]) (06/2010); and Wears glasses.   reports that she has never smoked. She has never used smokeless tobacco.  Past Surgical History:  Procedure Laterality Date  . A-FLUTTER ABLATION N/A 02/04/2017   Procedure: A-Flutter Ablation;  Surgeon: Evans Lance, MD;  Location: [facility];  Service: Cardiovascular;  Laterality: N/A;  . ABDOMINAL HYSTERECTOMY    . BREAST LUMPECTOMY WITH RADIOACTIVE SEED LOCALIZATION Left 02/14/2015   Procedure: BREAST LUMPECTOMY WITH RADIOACTIVE SEED LOCALIZATION;  Surgeon: Alphonsa Overall, MD;  Location: [facility];  Service: General;  Laterality: Left;  . BREAST SURGERY  1990   rt br bx  . CARDIAC CATHETERIZATION  2011   ablasion  . COLONOSCOPY    . DILATION AND CURETTAGE OF UTERUS    . Left Breast Biopsy  01/15/15  . TUBAL LIGATION      Allergies  Allergen Reactions  . Adhesive [Tape] Rash    Including EKG electrodes and the like.  . Codeine Other (See Comments)    unknown  . Latex Itching  . Percodan [Oxycodone-Aspirin] Nausea And Vomiting  Nausea     Immunization History  Administered Date(s) Administered  . Influenza Split 10/08/2009, 09/15/2010, 10/28/2011, 10/31/2012, 09/27/2013, 09/17/2015  . Influenza, High Dose Seasonal PF 09/06/2014  . Influenza,inj,Quad PF,36+ Mos 09/12/2016  . Pneumococcal Conjugate-13 09/06/2014  . Pneumococcal Polysaccharide-23 09/12/2002  . Td 12/13/2005  . Zoster 08/14/2008    Family History  Problem Relation Age of Onset  . Hypertension Mother   . Heart disease Mother   . Arthritis Mother   .  Other Father        hardening of the arteries  . Heart Problems Sister        pacemaker  . Heart attack Brother   . Throat cancer Paternal Uncle   . Breast cancer Sister        early 68s  . Alzheimer's disease Sister   . Pulmonary disease Sister   . Breast cancer Daughter 3       Negative genetic testing   . Breast cancer Paternal Aunt        dx over 82  . Cancer Paternal Aunt        cancer in her leg  . Breast cancer Cousin        multiple paternal cousin     Current Outpatient Prescriptions:  .  Carboxymethylcellul-Glycerin (LUBRICATING EYE DROPS OP), Apply 1 drop to eye daily as needed (dry eyes)., Disp: , Rfl:  .  ELIQUIS 5 MG TABS tablet, TAKE 1 TABLET BY MOUTH TWO  TIMES DAILY, Disp: 180 tablet, Rfl: 1 .  levothyroxine (SYNTHROID, LEVOTHROID) 75 MCG tablet, Take 75 mcg by mouth daily before breakfast., Disp: , Rfl:  .  losartan (COZAAR) 50 MG tablet, Take 1 tablet (50 mg total) by mouth daily., Disp: 90 tablet, Rfl: 3 .  metoprolol succinate (TOPROL-XL) 50 MG 24 hr tablet, Take 1 tablet (50 mg total) by mouth daily., Disp: 90 tablet, Rfl: 3 .  oxybutynin (DITROPAN) 5 MG tablet, Take 5 mg by mouth 2 (two) times daily., Disp: , Rfl:  .  pantoprazole (PROTONIX) 40 MG tablet, Take 40 mg by mouth daily. , Disp: , Rfl:  .  vitamin B-12 (CYANOCOBALAMIN) 100 MCG tablet, Take 100 mcg by mouth daily., Disp: , Rfl:  .  Vitamin D, Ergocalciferol, (DRISDOL) 50000 units CAPS capsule, Take 50,000 Units by mouth every 7 (seven) days., Disp: , Rfl:     Review of Systems     Objective:   Physical Exam  Constitutional: She is oriented to person, place, and time. She appears well-developed and well-nourished. No distress.  HENT:  Head: Normocephalic and atraumatic.  Right Ear: External ear normal.  Left Ear: External ear normal.  Mouth/Throat: Oropharynx is clear and moist. No oropharyngeal exudate.  Eyes: Conjunctivae and EOM are normal. Pupils are equal, round, and reactive to  light. Right eye exhibits no discharge. Left eye exhibits no discharge. No scleral icterus.  Neck: Normal range of motion. Neck supple. No JVD present. No tracheal deviation present. No thyromegaly present.  Cardiovascular: Normal rate, regular rhythm, normal heart sounds and intact distal pulses.  Exam reveals no gallop and no friction rub.   No murmur heard. Pulmonary/Chest: Effort normal. No respiratory distress. She has no wheezes. She has rales. She exhibits no tenderness.  Abdominal: Soft. Bowel sounds are normal. She exhibits no distension and no mass. There is no tenderness. There is no rebound and no guarding.  Musculoskeletal: Normal range of motion. She exhibits no edema or tenderness.  Lymphadenopathy:    She  has no cervical adenopathy.  Neurological: She is alert and oriented to person, place, and time. She has normal reflexes. No cranial nerve deficit. She exhibits normal muscle tone. Coordination normal.  Skin: Skin is warm and dry. No rash noted. She is not diaphoretic. No erythema. No pallor.  Psychiatric: She has a normal mood and affect. Her behavior is normal. Judgment and thought content normal.  Vitals reviewed.  Vitals:   05/17/17 1228  BP: 126/68  Pulse: 60  SpO2: 97%  Weight: 159 lb (72.1 kg)  Height: 5' 7.5" (1.715 m)    Estimated body mass index is 24.54 kg/m as calculated from the following:   Height as of this encounter: 5' 7.5" (1.715 m).   Weight as of this encounter: 159 lb (72.1 kg).       Assessment:       ICD-9-CM ICD-10-CM   1. ILD (interstitial lung disease) (HCC) 515 J84.9 CT Chest High Resolution     ANA, IFA Comprehensive Panel     Sed Rate (ESR)     Rheumatoid factor     Cyclic citrul peptide antibody, IgG     Anti-scleroderma antibody     Aldolase     Hypersensitivity pnuemonitis profile       Plan:       ICD-9-CM ICD-10-CM   1. ILD (interstitial lung disease) (HCC) 515 J84.9 CT Chest High Resolution     ANA, IFA Comprehensive  Panel     Sed Rate (ESR)     Rheumatoid factor     Cyclic citrul peptide antibody, IgG     Anti-scleroderma antibody     Aldolase     Hypersensitivity pnuemonitis profile   ILD (interstitial lung disease) (HCC)  - I am concerned you might have Interstitial Lung Disease (ILD)  -  There are > 6 varieties of this - To narrow down possibilities and assess severity please do the following tests   - do autoimmune panel: Serum: ESR, ANA, DS-DNA, RF, anti-CCP, ssA, ssB, scl-70, Total CK,  Aldolase,  Hypersensitivity Pneumonitis Panel - at this point it is mild - In ideal situation people need lung biopsy to differentiate different varieties especially if blood work negative - However, in your situation probably best we observe and avoid risk of biopsy  Followup - in 9 months repeat HRCT -  - return in 9 months as followup but after HRCT - if blood work abnormal will call you in sooner  > 50% of this > 25 min visit spent in face to face counseling or coordination of care    Dr. Brand Males, M.D., [facility].C.P Pulmonary and Critical Care Medicine Staff Physician [facility] Pager: [phone], If no answer or between  15:00h - 7:00h: call [phone]  05/17/2017 6:28 PM

## 2017-05-17 NOTE — Assessment & Plan Note (Signed)
-  I am concerned you might have Interstitial Lung Disease (ILD)  -  There are > 6 varieties of this - To narrow down possibilities and assess severity please do the following tests   - do autoimmune panel: Serum: ESR, ANA, DS-DNA, RF, anti-CCP, ssA, ssB, scl-70, Total CK,  Aldolase,  Hypersensitivity Pneumonitis Panel - at this point it is mild - In ideal situation people need lung biopsy to differentiate different varieties especially if blood work negative - However, in your situation probably best we observe and avoid risk of biopsy  Followup - in 9 months repeat HRCT -  - return in 9 months as followup but after HRCT - if blood work abnormal will call you in sooner

## 2017-05-17 NOTE — Patient Instructions (Signed)
ILD (interstitial lung disease) (HCC)  - I am concerned you might have Interstitial Lung Disease (ILD)  -  There are > 6 varieties of this - To narrow down possibilities and assess severity please do the following tests   - do autoimmune panel: Serum: ESR, ANA, DS-DNA, RF, anti-CCP, ssA, ssB, scl-70, Total CK,  Aldolase,  Hypersensitivity Pneumonitis Panel - at this point it is mild - In ideal situation people need lung biopsy to differentiate different varieties especially if blood work negative - However, in your situation probably best we observe and avoid risk of biopsy  Followup - in 9 months repeat HRCT -  - return in 9 months as followup but after HRCT - if blood work abnormal will call you in sooner   

## 2017-05-17 NOTE — Progress Notes (Signed)
PFT done today. 

## 2017-05-18 LAB — ANTI-SCLERODERMA ANTIBODY: SCLERODERMA (SCL-70) (ENA) ANTIBODY, IGG: NEGATIVE

## 2017-05-18 LAB — RHEUMATOID FACTOR

## 2017-05-19 LAB — CYCLIC CITRUL PEPTIDE ANTIBODY, IGG

## 2017-05-19 LAB — ALDOLASE: Aldolase: 4.6 U/L (ref <=8.1)

## 2017-05-20 ENCOUNTER — Telehealth: Payer: Self-pay | Admitting: Hematology and Oncology

## 2017-05-20 NOTE — Telephone Encounter (Signed)
lvm to inform to of r/s appt to 8/23 at 0900 per sch msg

## 2017-05-22 LAB — HYPERSENSITIVITY PNUEMONITIS PROFILE

## 2017-05-30 ENCOUNTER — Telehealth: Payer: Self-pay | Admitting: Internal Medicine

## 2017-05-30 NOTE — Telephone Encounter (Signed)
Pt is requesting lab results from 05/17/17. Pt is aware that MR has been unavailable.  MR please advise. Thanks.

## 2017-06-02 NOTE — Telephone Encounter (Signed)
Pt is aware of results and voiced her understanding. Nothing further needed.  

## 2017-06-02 NOTE — Telephone Encounter (Signed)
Autoimmune and HP panel - negative.   Dr. Brand Males, M.D., [facility].C.P Pulmonary and Critical Care Medicine Staff Physician [facility] Pager: [phone], If no answer or between  15:00h - 7:00h: call [phone]  06/02/2017 8:36 AM

## 2017-07-21 ENCOUNTER — Ambulatory Visit: Payer: Medicare Other | Admitting: Hematology and Oncology

## 2017-08-04 ENCOUNTER — Ambulatory Visit (HOSPITAL_BASED_OUTPATIENT_CLINIC_OR_DEPARTMENT_OTHER): Payer: Medicare Other | Admitting: Hematology and Oncology

## 2017-08-04 ENCOUNTER — Encounter: Payer: Self-pay | Admitting: Hematology and Oncology

## 2017-08-04 DIAGNOSIS — Z170 Estrogen receptor positive status [ER+]: Secondary | ICD-10-CM

## 2017-08-04 DIAGNOSIS — Z79811 Long term (current) use of aromatase inhibitors: Secondary | ICD-10-CM | POA: Diagnosis not present

## 2017-08-04 DIAGNOSIS — C50512 Malignant neoplasm of lower-outer quadrant of left female breast: Secondary | ICD-10-CM | POA: Diagnosis not present

## 2017-08-04 NOTE — Assessment & Plan Note (Signed)
Left breast biopsy: Invasive lobular cancer, grade 2, ER 94%, PR 29%, Ki-67 19%, HER-2 negative, 1.7 x 1.6 x 1.4 cm by MRI on 01/21/2015, clinical stage TI cN0 M0 stage IA. Left lumpectomy 02/14/2015: 1.7 cm invasive lobular cancer, grade 1, ER 94%, PA 29%, Ki-67 19%, HER-2 negative, T1 cN0 M0 stage IA pathologic staging Current treatment: Anastrozole 1 mg daily started 04/27/2015 stopped August 2016 due to dry mouth and lack of taste; change in treatment to letrozole 2.5 mg daily from 09/21/2015  Letrozole toxicities: 1. Dry mouth and lack of taste These symptoms have resolved since she stopped anastrozole. She decided that she does not want to take any other antiestrogen treatment.  Breast Cancer Surveillance: 1. Breast exam 08/04/2017 is without any clinical concerns for disease recurrence 2. Mammograms January 2018 3. CT chest 03/08/2018performed for dyspnea: Patchy reticulation and groundglass changes in both lungs suggestive of interstitial lung disease mild mediastinal adenopathy stable since 2013 aortic aneurysm 4.3 cm  Return to clinic in 1 year for follow-up

## 2017-08-04 NOTE — Progress Notes (Signed)
Patient Care Team: Lajean Manes, MD as PCP - General (Internal Medicine) Sueanne Margarita, MD as Consulting Physician (Cardiology) Nicholas Lose, MD as Consulting Physician (Hematology and Oncology) Arloa Koh, MD as Consulting Physician (Radiation Oncology) Sylvan Cheese, NP as Nurse Practitioner (Nurse Practitioner) Alphonsa Overall, MD as Consulting Physician (General Surgery)  DIAGNOSIS:  Encounter Diagnosis  Name Primary?  . Malignant neoplasm of lower-outer quadrant of left breast of female, estrogen receptor positive ([place])     SUMMARY OF ONCOLOGIC HISTORY:   Breast cancer of lower-outer quadrant of left female breast ([place])   01/15/2015 Initial Biopsy    Left breast biopsy: Invasive lobular cancer, grade 2, ER+ (94%), PR+ (29%),  HER-2 negative, Ki-67 19%.      01/21/2015 Breast MRI    Left breast:: 1.7 x 1.6 x 1.4 cm irregular enhancing mass lower outer quadrant, no abnormal lymph nodes      01/21/2015 Clinical Stage    Stage IA: TI cN0 M0.      02/14/2015 Surgery    Left breast lumpectomy ([facility]): Invasive lobular cancer, negative for LV I, tumor size 01.7 cm, margins negative, HER-2 was repeated and it was negative      02/14/2015 Pathologic Stage    Stage IA: T1c N0      02/26/2015 Miscellaneous    Pt opted for no RT.      03/12/2015 Procedure    Genetic testing: OvaNext gene panel  ([employer]) revealed VUS at ATM. Otherwise negative BARD1, BRCA1, BRCA2, BRIP1, CDH1, CHEK2, EPCAM, MLH1, MRE11A, MSH2, MSH6, MUTYH, NBN, NF1, PALB2, PMS2, PTEN, RAD50, RAD51C, RAD51D, SMARCA4, STK11, TP53.      04/29/2015 -  Anti-estrogen oral therapy    Anastrozole 1 mg daily changed to letrozole 2.5 mg daily 09/19/2015 due to dry mouth and loss of taste      08/22/2015 Survivorship    A copy of the survivorship care plan was mailed to the patient in lieu of an in-person visit at her request.       CHIEF COMPLIANT:  Follow-up on letrozole therapy  INTERVAL  HISTORY: [name] is a  83 year old with above-mentioned history of left breast cancer underwent lumpectomy followed by antiestrogen therapy.  She stop antiestrogen therapy because she felt that it was causing her dry mouth. She has had chronic issues with dry mouth and difficulty with the taste issues. She continues to have the same problems.  REVIEW OF SYSTEMS:   Constitutional: Denies fevers, chills or abnormal weight loss Eyes: Denies blurriness of vision Ears, nose, mouth, throat, and face: Denies mucositis or sore throat Respiratory: Denies cough, dyspnea or wheezes Cardiovascular: Denies palpitation, chest discomfort Gastrointestinal:  Denies nausea, heartburn or change in bowel habits Skin: Denies abnormal skin rashes Lymphatics: Denies new lymphadenopathy or easy bruising Neurological:Denies numbness, tingling or new weaknesses Behavioral/Psych: Mood is stable, no new changes  Extremities: No lower extremity edema All other systems were reviewed with the patient and are negative.  I have reviewed the past medical history, past surgical history, social history and family history with the patient and they are unchanged from previous note.  ALLERGIES:  is allergic to adhesive [tape]; codeine; latex; and percodan [oxycodone-aspirin].  MEDICATIONS:  Current Outpatient Prescriptions  Medication Sig Dispense Refill  . Carboxymethylcellul-Glycerin (LUBRICATING EYE DROPS OP) Apply 1 drop to eye daily as needed (dry eyes).    Marland Kitchen ELIQUIS 5 MG TABS tablet TAKE 1 TABLET BY MOUTH TWO  TIMES DAILY 180 tablet 1  .  levothyroxine (SYNTHROID, LEVOTHROID) 75 MCG tablet Take 75 mcg by mouth daily before breakfast.    . losartan (COZAAR) 50 MG tablet Take 1 tablet (50 mg total) by mouth daily. 90 tablet 3  . metoprolol succinate (TOPROL-XL) 50 MG 24 hr tablet Take 1 tablet (50 mg total) by mouth daily. 90 tablet 3  . oxybutynin (DITROPAN) 5 MG tablet Take 5 mg by mouth 2 (two) times daily.    .  pantoprazole (PROTONIX) 40 MG tablet Take 40 mg by mouth daily.     . vitamin B-12 (CYANOCOBALAMIN) 100 MCG tablet Take 100 mcg by mouth daily.    . Vitamin D, Ergocalciferol, (DRISDOL) 50000 units CAPS capsule Take 50,000 Units by mouth every 7 (seven) days.     No current facility-administered medications for this visit.     PHYSICAL EXAMINATION: ECOG PERFORMANCE STATUS: 1 - Symptomatic but completely ambulatory  Vitals:   08/04/17 0855  BP: (!) 147/91  Pulse: 76  Resp: 18  Temp: (!) 97.5 F (36.4 C)  SpO2: 97%   Filed Weights   08/04/17 0855  Weight: 162 lb 3.2 oz (73.6 kg)    GENERAL:alert, no distress and comfortable SKIN: skin color, texture, turgor are normal, no rashes or significant lesions EYES: normal, Conjunctiva are pink and non-injected, sclera clear OROPHARYNX:no exudate, no erythema and lips, buccal mucosa, and tongue normal  NECK: supple, thyroid normal size, non-tender, without nodularity LYMPH:  no palpable lymphadenopathy in the cervical, axillary or inguinal LUNGS: clear to auscultation and percussion with normal breathing effort HEART: regular rate & rhythm and no murmurs and no lower extremity edema ABDOMEN:abdomen soft, non-tender and normal bowel sounds MUSCULOSKELETAL:no cyanosis of digits and no clubbing  NEURO: alert & oriented x 3 with fluent speech, no focal motor/sensory deficits EXTREMITIES: No lower extremity edema  LABORATORY DATA:  I have reviewed the data as listed   Chemistry      Component Value Date/Time   NA 139 04/27/2017 1142   NA 140 02/17/2015 1054   K 4.6 04/27/2017 1142   K 3.8 02/17/2015 1054   CL 104 04/27/2017 1142   CO2 19 04/27/2017 1142   CO2 25 02/17/2015 1054   BUN 15 04/27/2017 1142   BUN 15.8 02/17/2015 1054   CREATININE 0.78 04/27/2017 1142   CREATININE 0.7 02/17/2015 1054      Component Value Date/Time   CALCIUM 9.0 04/27/2017 1142   CALCIUM 8.9 02/17/2015 1054   ALKPHOS 102 02/17/2015 1054   AST 17  02/17/2015 1054   ALT 13 02/17/2015 1054   BILITOT 0.31 02/17/2015 1054       Lab Results  Component Value Date   WBC 6.6 01/28/2017   HGB 12.2 01/28/2017   HCT 37.4 01/28/2017   MCV 89 01/28/2017   PLT 196 01/28/2017   NEUTROABS 4.5 01/28/2017    ASSESSMENT & PLAN:  Breast cancer of lower-outer quadrant of left female breast Left breast biopsy: Invasive lobular cancer, grade 2, ER 94%, PR 29%, Ki-67 19%, HER-2 negative, 1.7 x 1.6 x 1.4 cm by MRI on 01/21/2015, clinical stage TI cN0 M0 stage IA. Left lumpectomy 02/14/2015: 1.7 cm invasive lobular cancer, grade 1, ER 94%, PA 29%, Ki-67 19%, HER-2 negative, T1 cN0 M0 stage IA pathologic staging Current treatment: Anastrozole 1 mg daily started 04/27/2015 stopped August 2016 due to dry mouth and lack of taste; change in treatment to letrozole 2.5 mg daily from 09/21/2015  Letrozole toxicities: 1. Dry mouth and lack of taste  The symptoms have persisted in spite of stopping letrozole therapy  Breast Cancer Surveillance: 1. Breast exam 08/04/2017 is without any clinical concerns for disease recurrence 2. Mammograms January 2018 3. CT chest 03/08/2018performed for dyspnea: Patchy reticulation and groundglass changes in both lungs suggestive of interstitial lung disease mild mediastinal adenopathy stable since 2013 aortic aneurysm 4.3 cm  Return to clinic in 1 year for follow-up  I spent 25 minutes talking to the patient of which more than half was spent in counseling and coordination of care.  No orders of the defined types were placed in this encounter.  The patient has a good understanding of the overall plan. she agrees with it. she will call with any problems that may develop before the next visit here.   Rulon Eisenmenger, MD 08/04/17

## 2017-08-08 ENCOUNTER — Telehealth: Payer: Self-pay | Admitting: Internal Medicine

## 2017-08-08 NOTE — Telephone Encounter (Signed)
Pt called to be seen today. Pt states that she has been having palpitations, LE swelling and SOB with mild activity for a week. Pt is in the [place] with her husband now. Pt is not able to check per BP or heart rate. Pt  States she can come to the office this afternoon, because it takes 3 hours to get here. Pt states Dr. Lovena Le had scheduled her to have an ablation February this year. Pt was in the hospital about to do the ablation when MD said that it was too dangerous to do it. Pt states that the arrhythmia is back again. Spoke with Melina Copa PA she states that because the pt has a history of A-Flutter, SVT, it will be better for the arrhythmia clinic to see her. The arrhythmia clinic was called. Roderic Palau can see pt  tomorrow. An  appointment was made for 8:30 AM on 08/09/17. Pt and husband are aware of tomorrow's appointment and that if pt's symptoms get worse pt needs to go to the ER. Pt and husband verbalized understanding.

## 2017-08-08 NOTE — Telephone Encounter (Signed)
New message  Pt c/o Shortness Of Breath: STAT if SOB developed within the last 24 hours or pt is noticeably SOB on the phone  1. Are you currently SOB (can you hear that pt is SOB on the phone)? Yes   2. How long have you been experiencing SOB? Per p for a while now  3. Are you SOB when sitting or when up moving around? moving  4. Are you currently experiencing any other symptoms? palpitations   Per pt would like to be seen today

## 2017-08-09 ENCOUNTER — Encounter (HOSPITAL_COMMUNITY): Payer: Self-pay | Admitting: Nurse Practitioner

## 2017-08-09 ENCOUNTER — Ambulatory Visit (HOSPITAL_BASED_OUTPATIENT_CLINIC_OR_DEPARTMENT_OTHER)
Admission: RE | Admit: 2017-08-09 | Discharge: 2017-08-09 | Disposition: A | Discharge status: Home / Self Care | Payer: Medicare Other | Source: Ambulatory Visit | Attending: Nurse Practitioner | Admitting: Nurse Practitioner

## 2017-08-09 VITALS — BP 136/84 | HR 100 | Ht 67.5 in | Wt 162.8 lb

## 2017-08-09 DIAGNOSIS — I341 Nonrheumatic mitral (valve) prolapse: Secondary | ICD-10-CM

## 2017-08-09 DIAGNOSIS — I509 Heart failure, unspecified: Secondary | ICD-10-CM | POA: Diagnosis not present

## 2017-08-09 DIAGNOSIS — Z79899 Other long term (current) drug therapy: Secondary | ICD-10-CM | POA: Insufficient documentation

## 2017-08-09 DIAGNOSIS — I480 Paroxysmal atrial fibrillation: Secondary | ICD-10-CM

## 2017-08-09 DIAGNOSIS — Z79890 Hormone replacement therapy (postmenopausal): Secondary | ICD-10-CM | POA: Insufficient documentation

## 2017-08-09 DIAGNOSIS — I10 Essential (primary) hypertension: Secondary | ICD-10-CM | POA: Insufficient documentation

## 2017-08-09 DIAGNOSIS — I481 Persistent atrial fibrillation: Secondary | ICD-10-CM

## 2017-08-09 DIAGNOSIS — Z853 Personal history of malignant neoplasm of breast: Secondary | ICD-10-CM | POA: Insufficient documentation

## 2017-08-09 DIAGNOSIS — I4819 Other persistent atrial fibrillation: Secondary | ICD-10-CM

## 2017-08-09 LAB — CBC
HCT: 37.5 % (ref 36.0–46.0)
Hemoglobin: 12 g/dL (ref 12.0–15.0)
MCH: 29.2 pg (ref 26.0–34.0)
MCHC: 32 g/dL (ref 30.0–36.0)
MCV: 91.2 fL (ref 78.0–100.0)
PLATELETS: 194 10*3/uL (ref 150–400)
RBC: 4.11 MIL/uL (ref 3.87–5.11)
RDW: 14.5 % (ref 11.5–15.5)
WBC: 4.9 10*3/uL (ref 4.0–10.5)

## 2017-08-09 LAB — BASIC METABOLIC PANEL WITH GFR
Anion gap: 7 (ref 5–15)
BUN: 12 mg/dL (ref 6–20)
CO2: 26 mmol/L (ref 22–32)
Calcium: 9.1 mg/dL (ref 8.9–10.3)
Chloride: 108 mmol/L (ref 101–111)
Creatinine, Ser: 0.82 mg/dL (ref 0.44–1.00)
GFR calc Af Amer: >60 mL/min
GFR calc non Af Amer: >60 mL/min
Glucose, Bld: 109 mg/dL — ABNORMAL HIGH (ref 65–99)
Potassium: 4 mmol/L (ref 3.5–5.1)
Sodium: 141 mmol/L (ref 135–145)

## 2017-08-09 NOTE — Progress Notes (Signed)
Primary Care Physician: Lajean Manes, MD Referring Physician: [place] Triage EP: Dr. Octavio Graves is a 83 y.o. female with a h/o  SVT, s/p catheter ablation of AVNRT in 2011. She did well but was seen in Dr. Tanna Furry in early February with a regular narrow QRS tachycardia, either atrial tachy with 1:1 conduction or atrial flutter with 2:1 conduction. She was placed on anti-coagulation and AV nodal blocking drugs. She appears to have a 2:1 flutter vs atrial tachy with 2:1 conduction. She was taken back for EP study.She was found to have left atrial (likely mitral annular reentry) flutter with a RVR and underwent DCCV. Ablation was not attempted.She also had inducible AVNRT at a slower rate and evidence of AV conduction system disease.   She is in the afib clinic as she has felt poorly with fatigue and shortness for at least one week. EKG shows afib at 100 bpm. She is on eliquis 5 mg bid for a chadsvasc score of at least 4. States no missed doses, a couple doses may have been delayed by a few hours.  Today, she denies symptoms of palpitations, chest pain, shortness of breath, orthopnea, PND, lower extremity edema, dizziness, presyncope, syncope, or neurologic sequela. The patient is tolerating medications without difficulties and is otherwise without complaint today.   Past Medical History:  Diagnosis Date  . Allergic rhinitis   . Anemia   . Breast cancer, left breast ([facility]) 01/15/15   Invasive Mammary  . Chronic venous insufficiency   . Hematuria    negative workup - Dr. Reece Agar  . Hypertension   . Migraine headache   . MVP (mitral valve prolapse)   . PAF (paroxysmal atrial fibrillation) ([place])   . SVT (supraventricular tachycardia) ([place]) 06/2010   s/p AVNRT ablation  . Wears glasses    Past Surgical History:  Procedure Laterality Date  . A-FLUTTER ABLATION N/A 02/04/2017   Procedure: A-Flutter Ablation;  Surgeon: Evans Lance, MD;  Location: [facility];  Service: Cardiovascular;  Laterality: N/A;  . ABDOMINAL HYSTERECTOMY    . BREAST LUMPECTOMY WITH RADIOACTIVE SEED LOCALIZATION Left 02/14/2015   Procedure: BREAST LUMPECTOMY WITH RADIOACTIVE SEED LOCALIZATION;  Surgeon: Alphonsa Overall, MD;  Location: [facility];  Service: General;  Laterality: Left;  . BREAST SURGERY  1990   rt br bx  . CARDIAC CATHETERIZATION  2011   ablasion  . COLONOSCOPY    . DILATION AND CURETTAGE OF UTERUS    . Left Breast Biopsy  01/15/15  . TUBAL LIGATION      Current Outpatient Prescriptions  Medication Sig Dispense Refill  . Carboxymethylcellul-Glycerin (LUBRICATING EYE DROPS OP) Apply 1 drop to eye daily as needed (dry eyes).    Marland Kitchen ELIQUIS 5 MG TABS tablet TAKE 1 TABLET BY MOUTH TWO  TIMES DAILY 180 tablet 1  . levothyroxine (SYNTHROID, LEVOTHROID) 75 MCG tablet Take 75 mcg by mouth daily before breakfast.    . losartan (COZAAR) 50 MG tablet Take 1 tablet (50 mg total) by mouth daily. 90 tablet 3  . metoprolol succinate (TOPROL-XL) 50 MG 24 hr tablet Take 1 tablet (50 mg total) by mouth daily. 90 tablet 3  . oxybutynin (DITROPAN) 5 MG tablet Take 5 mg by mouth 2 (two) times daily.    . pantoprazole (PROTONIX) 40 MG tablet Take 40 mg by mouth daily.     . vitamin B-12 (CYANOCOBALAMIN) 100 MCG tablet Take 100 mcg by mouth daily.    Marland Kitchen  Vitamin D, Ergocalciferol, (DRISDOL) 50000 units CAPS capsule Take 50,000 Units by mouth every 7 (seven) days.     No current facility-administered medications for this encounter.     Allergies  Allergen Reactions  . Adhesive [Tape] Rash    Including EKG electrodes and the like.  . Codeine Other (See Comments)    unknown  . Latex Itching  . Percodan [Oxycodone-Aspirin] Nausea And Vomiting    Nausea     Social History   Social History  . Marital status: Married    Spouse name: N/A  . Number of children: 3  . Years of education: N/A   Occupational History  . Not on file.   Social History Main Topics   . Smoking status: Never Smoker  . Smokeless tobacco: Never Used  . Alcohol use 0.0 oz/week     Comment: once a week  . Drug use: No  . Sexual activity: Not on file   Other Topics Concern  . Not on file   Social History Narrative   [place] with husband.    Retired.     Family History  Problem Relation Age of Onset  . Hypertension Mother   . Heart disease Mother   . Arthritis Mother   . Other Father        hardening of the arteries  . Heart Problems Sister        pacemaker  . Heart attack Brother   . Throat cancer Paternal Uncle   . Breast cancer Sister        early 34s  . Alzheimer's disease Sister   . Pulmonary disease Sister   . Breast cancer Daughter 38       Negative genetic testing   . Breast cancer Paternal Aunt        dx over 1  . Cancer Paternal Aunt        cancer in her leg  . Breast cancer Cousin        multiple paternal cousin    ROS- All systems are reviewed and negative except as per the HPI above  Physical Exam: Vitals:   08/09/17 0839  BP: 136/84  Pulse: 100  SpO2: 95%  Weight: 162 lb 12.8 oz (73.8 kg)  Height: 5' 7.5" (1.715 m)   Wt Readings from Last 3 Encounters:  08/09/17 162 lb 12.8 oz (73.8 kg)  08/04/17 162 lb 3.2 oz (73.6 kg)  05/17/17 159 lb (72.1 kg)    Labs: Lab Results  Component Value Date   NA 141 08/09/2017   K 4.0 08/09/2017   CL 108 08/09/2017   CO2 26 08/09/2017   GLUCOSE 109 (H) 08/09/2017   BUN 12 08/09/2017   CREATININE 0.82 08/09/2017   CALCIUM 9.1 08/09/2017   Lab Results  Component Value Date   INR 1.0 01/28/2017   Lab Results  Component Value Date   CHOL  04/20/2010    128        ATP III CLASSIFICATION:  <200     mg/dL   Desirable  200-239  mg/dL   Borderline High  >=240    mg/dL   High          HDL 51 04/20/2010   LDLCALC  04/20/2010    69        Total Cholesterol/HDL:CHD Risk Coronary Heart Disease Risk Table                     Men   Women  1/2 Average Risk   3.4   3.3  Average Risk        5.0   4.4  2 X Average Risk   9.6   7.1  3 X Average Risk  23.4   11.0        Use the calculated Patient Ratio above and the CHD Risk Table to determine the patient's CHD Risk.        ATP III CLASSIFICATION (LDL):  <100     mg/dL   Optimal  100-129  mg/dL   Near or Above                    Optimal  130-159  mg/dL   Borderline  160-189  mg/dL   High  >190     mg/dL   Very High   TRIG 39 04/20/2010     GEN- The patient is well appearing, alert and oriented x 3 today.   Head- normocephalic, atraumatic Eyes-  Sclera clear, conjunctiva pink Ears- hearing intact Oropharynx- clear Neck- supple, no JVP Lymph- no cervical lymphadenopathy Lungs- Clear to ausculation bilaterally, normal work of breathing Heart-irregular rate and rhythm, no murmurs, rubs or gallops, PMI not laterally displaced GI- soft, NT, ND, + BS Extremities- no clubbing, cyanosis, or edema MS- no significant deformity or atrophy Skin- no rash or lesion Psych- euthymic mood, full affect Neuro- strength and sensation are intact  EKG-afib with a rare PVC, v rate 100 bpm, qrs int 88 ms, qtc 485 ms Echo-Study Conclusions  - Left ventricle: The cavity size was normal. There was moderate   concentric hypertrophy. Systolic function was moderately to   severely reduced. The estimated ejection fraction was in the   range of 30% to 35%. Wall motion was normal; there were no   regional wall motion abnormalities. - Aortic root: The aortic root was normal in size. - Ascending aorta: The ascending aorta was mildly dilated maesuring   41 mm. - Mitral valve: Calcified annulus. Mildly thickened leaflets .   There was mild regurgitation. - Left atrium: The atrium was mildly dilated. - Right ventricle: Systolic function was normal. - Tricuspid valve: There was mild regurgitation. - Pulmonary arteries: Systolic pressure was within the normal   range. - Inferior vena cava: The vessel was normal in size. - Pericardium,  extracardiac: There was no pericardial effusion.  Impressions:  - When compared to the prior study from 04/20/2010 LVEF has decreased   from 55-60% to 30-35%.     Assessment and Plan: 1. Persistent afib Pt is positive she has felt poorly for at least one week with shortness of breath and fatigue She has had no days of missed eliquis doses Discussed pursuing cardioversion to restore SR and she is in agreement Risk vrs benefit of procedure discussed Continue metoprolol at 50 mg daily Will schedule for tomorrow 8/29 She will follow up with Dr. Lovena Le as scheduled 9/11  Dr. Lovena Le informed of pursuing cardioversion and he is in agreement  [place]. Deon Ivey, [facility] [address] [place],  [address] [phone]

## 2017-08-09 NOTE — Patient Instructions (Addendum)
Your cardioversion is scheduled for : 08/10/2017 at 1:00 p.m.  Arrive at the [facility] and go to admitting at 11:30am Do Not eat or drink anything after midnight the night prior to your procedure. Take all your medications with a sip of water prior to arrival. Do NOT miss any doses of your blood thinner. You will NOT be able to drive home after your procedure.

## 2017-08-10 ENCOUNTER — Encounter (HOSPITAL_COMMUNITY)
Admission: RE | Disposition: A | Discharge status: Home / Self Care | Payer: Self-pay | Source: Ambulatory Visit | Attending: Cardiology

## 2017-08-10 ENCOUNTER — Encounter (HOSPITAL_COMMUNITY): Payer: Self-pay | Admitting: Anesthesiology

## 2017-08-10 ENCOUNTER — Ambulatory Visit (HOSPITAL_COMMUNITY): Payer: Medicare Other | Admitting: Anesthesiology

## 2017-08-10 ENCOUNTER — Ambulatory Visit (HOSPITAL_BASED_OUTPATIENT_CLINIC_OR_DEPARTMENT_OTHER)
Admission: RE | Admit: 2017-08-10 | Discharge: 2017-08-10 | Disposition: A | Discharge status: Home / Self Care | Payer: Medicare Other | Source: Ambulatory Visit | Attending: Cardiology | Admitting: Cardiology

## 2017-08-10 DIAGNOSIS — K219 Gastro-esophageal reflux disease without esophagitis: Secondary | ICD-10-CM

## 2017-08-10 DIAGNOSIS — I480 Paroxysmal atrial fibrillation: Secondary | ICD-10-CM | POA: Insufficient documentation

## 2017-08-10 DIAGNOSIS — Z853 Personal history of malignant neoplasm of breast: Secondary | ICD-10-CM | POA: Insufficient documentation

## 2017-08-10 DIAGNOSIS — E039 Hypothyroidism, unspecified: Secondary | ICD-10-CM | POA: Insufficient documentation

## 2017-08-10 DIAGNOSIS — I4819 Other persistent atrial fibrillation: Secondary | ICD-10-CM

## 2017-08-10 DIAGNOSIS — Z7901 Long term (current) use of anticoagulants: Secondary | ICD-10-CM | POA: Insufficient documentation

## 2017-08-10 DIAGNOSIS — I341 Nonrheumatic mitral (valve) prolapse: Secondary | ICD-10-CM

## 2017-08-10 DIAGNOSIS — Z79899 Other long term (current) drug therapy: Secondary | ICD-10-CM | POA: Insufficient documentation

## 2017-08-10 DIAGNOSIS — I481 Persistent atrial fibrillation: Secondary | ICD-10-CM

## 2017-08-10 DIAGNOSIS — I471 Supraventricular tachycardia: Secondary | ICD-10-CM | POA: Insufficient documentation

## 2017-08-10 DIAGNOSIS — I10 Essential (primary) hypertension: Secondary | ICD-10-CM

## 2017-08-10 HISTORY — PX: CARDIOVERSION: SHX1299

## 2017-08-10 SURGERY — CARDIOVERSION
Anesthesia: General

## 2017-08-10 MED ORDER — LIDOCAINE 2% (20 MG/ML) 5 ML SYRINGE
INTRAMUSCULAR | Status: DC | PRN
  Administered 2017-08-10: 40 mg via INTRAVENOUS

## 2017-08-10 MED ORDER — LACTATED RINGERS IV SOLN
INTRAVENOUS | Status: AC | PRN
  Administered 2017-08-10: 1000 mL via INTRAVENOUS

## 2017-08-10 MED ORDER — PROPOFOL 10 MG/ML IV BOLUS
INTRAVENOUS | Status: AC
  Filled 2017-08-10: qty 20

## 2017-08-10 MED ORDER — ROCURONIUM BROMIDE 10 MG/ML (PF) SYRINGE
PREFILLED_SYRINGE | INTRAVENOUS | Status: AC
  Filled 2017-08-10: qty 15

## 2017-08-10 MED ORDER — SODIUM CHLORIDE 0.9 % IV SOLN: INTRAVENOUS | Status: DC

## 2017-08-10 MED ORDER — NEOSTIGMINE METHYLSULFATE 5 MG/5ML IV SOSY
PREFILLED_SYRINGE | INTRAVENOUS | Status: AC
  Filled 2017-08-10: qty 10

## 2017-08-10 MED ORDER — PROPOFOL 10 MG/ML IV BOLUS
INTRAVENOUS | Status: DC | PRN
  Administered 2017-08-10: 60 mg via INTRAVENOUS

## 2017-08-10 NOTE — Discharge Instructions (Signed)
Electrical Cardioversion, Care After °This sheet gives you information about how to care for yourself after your procedure. Your health care provider may also give you more specific instructions. If you have problems or questions, contact your health care provider. °What can I expect after the procedure? °After the procedure, it is common to have: °· Some redness on the skin where the shocks were given. ° °Follow these instructions at home: °· Do not drive for 24 hours if you were given a medicine to help you relax (sedative). °· Take over-the-counter and prescription medicines only as told by your health care provider. °· Ask your health care provider how to check your pulse. Check it often. °· Rest for 48 hours after the procedure or as told by your health care provider. °· Avoid or limit your caffeine use as told by your health care provider. °Contact a health care provider if: °· You feel like your heart is beating too quickly or your pulse is not regular. °· You have a serious muscle cramp that does not go away. °Get help right away if: °· You have discomfort in your chest. °· You are dizzy or you feel faint. °· You have trouble breathing or you are short of breath. °· Your speech is slurred. °· You have trouble moving an arm or leg on one side of your body. °· Your fingers or toes turn cold or blue. °This information is not intended to replace advice given to you by your health care provider. Make sure you discuss any questions you have with your health care provider. °Document Released: 09/19/2013 Document Revised: 07/02/2016 Document Reviewed: 06/04/2016 °Elsevier Interactive Patient Education © 2018 [employer] ° °

## 2017-08-10 NOTE — Interval H&P Note (Signed)
History and Physical Interval Note:  08/10/2017 12:41 PM  [name]  has presented today for surgery, with the diagnosis of afib  The various methods of treatment have been discussed with the patient and family. After consideration of risks, benefits and other options for treatment, the patient has consented to  Procedure(s): CARDIOVERSION (N/A) as a surgical intervention .  The patient's history has been reviewed, patient examined, no change in status, stable for surgery.  I have reviewed the patient's chart and labs.  Questions were answered to the patient's satisfaction.     [employer]

## 2017-08-10 NOTE — CV Procedure (Signed)
    Electrical Cardioversion Procedure Note TANAYA DUNIGAN [id number] 08/30/1933  Procedure: Electrical Cardioversion Indications:  Atrial Fibrillation  Time Out: Verified patient identification, verified procedure,medications/allergies/relevent history reviewed, required imaging and test results available.  Performed  Procedure Details  The patient was NPO after midnight. Anesthesia was administered at the beside  With propofol.  Cardioversion was performed with synchronized biphasic defibrillation via AP pads with 120 joules.  1 attempt(s) were performed.  The patient converted to normal sinus rhythm. The patient tolerated the procedure well   IMPRESSION:  Successful cardioversion of atrial fibrillation    Candee Furbish 08/10/2017, 12:59 PM

## 2017-08-10 NOTE — Addendum Note (Signed)
Addendum  created 08/10/17 1323 by Julieta Bellini, CRNA   Charge Capture section accepted

## 2017-08-10 NOTE — Anesthesia Preprocedure Evaluation (Addendum)
Anesthesia Evaluation  Patient identified by MRN, date of birth, ID band Patient awake    Reviewed: Allergy & Precautions, NPO status , Patient's Chart, lab work & pertinent test results  Airway Mallampati: I       Dental  (+) Lower Dentures, Upper Dentures   Pulmonary neg pulmonary ROS,    Pulmonary exam normal breath sounds clear to auscultation       Cardiovascular hypertension, Pt. on medications +CHF   Rhythm:Irregular Rate:Tachycardia     Neuro/Psych  Headaches,    GI/Hepatic Neg liver ROS, GERD  ,  Endo/Other  Hypothyroidism   Renal/GU negative Renal ROS     Musculoskeletal negative musculoskeletal ROS (+)   Abdominal Normal abdominal exam  (+)   Peds  Hematology negative hematology ROS (+)   Anesthesia Other Findings Day of surgery medications reviewed with the patient.  Reproductive/Obstetrics                           - Left ventricle: The cavity size was normal. There was moderate   concentric hypertrophy. Systolic function was moderately to   severely reduced. The estimated ejection fraction was in the   range of 30% to 35%. Wall motion was normal; there were no   regional wall motion abnormalities. - Aortic root: The aortic root was normal in size. - Ascending aorta: The ascending aorta was mildly dilated maesuring   41 mm. - Mitral valve: Calcified annulus. Mildly thickened leaflets .   There was mild regurgitation. - Left atrium: The atrium was mildly dilated. - Right ventricle: Systolic function was normal. - Tricuspid valve: There was mild regurgitation. - Pulmonary arteries: Systolic pressure was within the normal   range. - Inferior vena cava: The vessel was normal in size.  Anesthesia Physical Anesthesia Plan  ASA: III  Anesthesia Plan: General   Post-op Pain Management:    Induction: Intravenous  PONV Risk Score and Plan: 0 and Treatment may vary due to  age or medical condition  Airway Management Planned: Natural Airway, Mask and Simple Face Mask  Additional Equipment:   Intra-op Plan:   Post-operative Plan:   Informed Consent: I have reviewed the patients History and Physical, chart, labs and discussed the procedure including the risks, benefits and alternatives for the proposed anesthesia with the patient or authorized representative who has indicated his/her understanding and acceptance.     Plan Discussed with:   Anesthesia Plan Comments:        Anesthesia Quick Evaluation

## 2017-08-10 NOTE — H&P (View-Only) (Signed)
Primary Care Physician: Lajean Manes, MD Referring Physician: Dickey Triage EP: [name] is a 83 y.o. female with a h/o  SVT, s/p catheter ablation of AVNRT in 2011. She did well but was seen in Dr. Tanna Furry in early February with a regular narrow QRS tachycardia, either atrial tachy with 1:1 conduction or atrial flutter with 2:1 conduction. She was placed on anti-coagulation and AV nodal blocking drugs. She appears to have a 2:1 flutter vs atrial tachy with 2:1 conduction. She was taken back for EP study.She was found to have left atrial (likely mitral annular reentry) flutter with a RVR and underwent DCCV. Ablation was not attempted.She also had inducible AVNRT at a slower rate and evidence of AV conduction system disease.   She is in the afib clinic as she has felt poorly with fatigue and shortness for at least one week. EKG shows afib at 100 bpm. She is on eliquis 5 mg bid for a chadsvasc score of at least 4. States no missed doses, a couple doses may have been delayed by a few hours.  Today, she denies symptoms of palpitations, chest pain, shortness of breath, orthopnea, PND, lower extremity edema, dizziness, presyncope, syncope, or neurologic sequela. The patient is tolerating medications without difficulties and is otherwise without complaint today.   Past Medical History:  Diagnosis Date  . Allergic rhinitis   . Anemia   . Breast cancer, left breast ([place]) 01/15/15   Invasive Mammary  . Chronic venous insufficiency   . Hematuria    negative workup - Dr. Reece Agar  . Hypertension   . Migraine headache   . MVP (mitral valve prolapse)   . PAF (paroxysmal atrial fibrillation) ([place])   . SVT (supraventricular tachycardia) ([place]) 06/2010   s/p AVNRT ablation  . Wears glasses    Past Surgical History:  Procedure Laterality Date  . A-FLUTTER ABLATION N/A 02/04/2017   Procedure: A-Flutter Ablation;  Surgeon: Evans Lance, MD;  Location: [facility];  Service: Cardiovascular;  Laterality: N/A;  . ABDOMINAL HYSTERECTOMY    . BREAST LUMPECTOMY WITH RADIOACTIVE SEED LOCALIZATION Left 02/14/2015   Procedure: BREAST LUMPECTOMY WITH RADIOACTIVE SEED LOCALIZATION;  Surgeon: Alphonsa Overall, MD;  Location: [facility];  Service: General;  Laterality: Left;  . BREAST SURGERY  1990   rt br bx  . CARDIAC CATHETERIZATION  2011   ablasion  . COLONOSCOPY    . DILATION AND CURETTAGE OF UTERUS    . Left Breast Biopsy  01/15/15  . TUBAL LIGATION      Current Outpatient Prescriptions  Medication Sig Dispense Refill  . Carboxymethylcellul-Glycerin (LUBRICATING EYE DROPS OP) Apply 1 drop to eye daily as needed (dry eyes).    Marland Kitchen ELIQUIS 5 MG TABS tablet TAKE 1 TABLET BY MOUTH TWO  TIMES DAILY 180 tablet 1  . levothyroxine (SYNTHROID, LEVOTHROID) 75 MCG tablet Take 75 mcg by mouth daily before breakfast.    . losartan (COZAAR) 50 MG tablet Take 1 tablet (50 mg total) by mouth daily. 90 tablet 3  . metoprolol succinate (TOPROL-XL) 50 MG 24 hr tablet Take 1 tablet (50 mg total) by mouth daily. 90 tablet 3  . oxybutynin (DITROPAN) 5 MG tablet Take 5 mg by mouth 2 (two) times daily.    . pantoprazole (PROTONIX) 40 MG tablet Take 40 mg by mouth daily.     . vitamin B-12 (CYANOCOBALAMIN) 100 MCG tablet Take 100 mcg by mouth daily.    Marland Kitchen  Vitamin D, Ergocalciferol, (DRISDOL) 50000 units CAPS capsule Take 50,000 Units by mouth every 7 (seven) days.     No current facility-administered medications for this encounter.     Allergies  Allergen Reactions  . Adhesive [Tape] Rash    Including EKG electrodes and the like.  . Codeine Other (See Comments)    unknown  . Latex Itching  . Percodan [Oxycodone-Aspirin] Nausea And Vomiting    Nausea     Social History   Social History  . Marital status: Married    Spouse name: N/A  . Number of children: 3  . Years of education: N/A   Occupational History  . Not on file.   Social History Main Topics   . Smoking status: Never Smoker  . Smokeless tobacco: Never Used  . Alcohol use 0.0 oz/week     Comment: once a week  . Drug use: No  . Sexual activity: Not on file   Other Topics Concern  . Not on file   Social History Narrative   LIves with husband.    Retired.     Family History  Problem Relation Age of Onset  . Hypertension Mother   . Heart disease Mother   . Arthritis Mother   . Other Father        hardening of the arteries  . Heart Problems Sister        pacemaker  . Heart attack Brother   . Throat cancer Paternal Uncle   . Breast cancer Sister        early 65s  . Alzheimer's disease Sister   . Pulmonary disease Sister   . Breast cancer Daughter 109       Negative genetic testing   . Breast cancer Paternal Aunt        dx over 57  . Cancer Paternal Aunt        cancer in her leg  . Breast cancer Cousin        multiple paternal cousin    ROS- All systems are reviewed and negative except as per the HPI above  Physical Exam: Vitals:   08/09/17 0839  BP: 136/84  Pulse: 100  SpO2: 95%  Weight: 162 lb 12.8 oz (73.8 kg)  Height: 5' 7.5" (1.715 m)   Wt Readings from Last 3 Encounters:  08/09/17 162 lb 12.8 oz (73.8 kg)  08/04/17 162 lb 3.2 oz (73.6 kg)  05/17/17 159 lb (72.1 kg)    Labs: Lab Results  Component Value Date   NA 141 08/09/2017   K 4.0 08/09/2017   CL 108 08/09/2017   CO2 26 08/09/2017   GLUCOSE 109 (H) 08/09/2017   BUN 12 08/09/2017   CREATININE 0.82 08/09/2017   CALCIUM 9.1 08/09/2017   Lab Results  Component Value Date   INR 1.0 01/28/2017   Lab Results  Component Value Date   CHOL  04/20/2010    128        ATP III CLASSIFICATION:  <200     mg/dL   Desirable  200-239  mg/dL   Borderline High  >=240    mg/dL   High          HDL 51 04/20/2010   LDLCALC  04/20/2010    69        Total Cholesterol/HDL:CHD Risk Coronary Heart Disease Risk Table                     Men   Women  1/2 Average Risk   3.4   3.3  Average Risk        5.0   4.4  2 X Average Risk   9.6   7.1  3 X Average Risk  23.4   11.0        Use the calculated Patient Ratio above and the CHD Risk Table to determine the patient's CHD Risk.        ATP III CLASSIFICATION (LDL):  <100     mg/dL   Optimal  100-129  mg/dL   Near or Above                    Optimal  130-159  mg/dL   Borderline  160-189  mg/dL   High  >190     mg/dL   Very High   TRIG 39 04/20/2010     GEN- The patient is well appearing, alert and oriented x 3 today.   Head- normocephalic, atraumatic Eyes-  Sclera clear, conjunctiva pink Ears- hearing intact Oropharynx- clear Neck- supple, no JVP Lymph- no cervical lymphadenopathy Lungs- Clear to ausculation bilaterally, normal work of breathing Heart-irregular rate and rhythm, no murmurs, rubs or gallops, PMI not laterally displaced GI- soft, NT, ND, + BS Extremities- no clubbing, cyanosis, or edema MS- no significant deformity or atrophy Skin- no rash or lesion Psych- euthymic mood, full affect Neuro- strength and sensation are intact  EKG-afib with a rare PVC, v rate 100 bpm, qrs int 88 ms, qtc 485 ms Echo-Study Conclusions  - Left ventricle: The cavity size was normal. There was moderate   concentric hypertrophy. Systolic function was moderately to   severely reduced. The estimated ejection fraction was in the   range of 30% to 35%. Wall motion was normal; there were no   regional wall motion abnormalities. - Aortic root: The aortic root was normal in size. - Ascending aorta: The ascending aorta was mildly dilated maesuring   41 mm. - Mitral valve: Calcified annulus. Mildly thickened leaflets .   There was mild regurgitation. - Left atrium: The atrium was mildly dilated. - Right ventricle: Systolic function was normal. - Tricuspid valve: There was mild regurgitation. - Pulmonary arteries: Systolic pressure was within the normal   range. - Inferior vena cava: The vessel was normal in size. - Pericardium,  extracardiac: There was no pericardial effusion.  Impressions:  - When compared to the prior study from 04/20/2010 LVEF has decreased   from 55-60% to 30-35%.     Assessment and Plan: 1. Persistent afib Pt is positive she has felt poorly for at least one week with shortness of breath and fatigue She has had no days of missed eliquis doses Discussed pursuing cardioversion to restore SR and she is in agreement Risk vrs benefit of procedure discussed Continue metoprolol at 50 mg daily Will schedule for tomorrow 8/29 She will follow up with Dr. Lovena Le as scheduled 9/11  Dr. Lovena Le informed of pursuing cardioversion and he is in agreement  [place]. [name], [facility] [address] [place], [place] [address] [phone]

## 2017-08-10 NOTE — Transfer of Care (Signed)
Immediate Anesthesia Transfer of Care Note  Patient: [name]  Procedure(s) Performed: Procedure(s): CARDIOVERSION (N/A)  Patient Location: Endoscopy Unit  Anesthesia Type:General  Level of Consciousness: awake, alert , oriented and patient cooperative  Airway & Oxygen Therapy: Patient Spontanous Breathing  Post-op Assessment: Report given to RN, Post -op Vital signs reviewed and stable and Patient moving all extremities X 4  Post vital signs: Reviewed and stable  Last Vitals:  Vitals:   08/10/17 1153 08/10/17 1259  BP: (!) 158/117   Resp: 18   Temp: 36.7 C 36.4 C  SpO2: 95%     Last Pain:  Vitals:   08/10/17 1259  TempSrc: Oral         Complications: No apparent anesthesia complications

## 2017-08-10 NOTE — Anesthesia Postprocedure Evaluation (Signed)
Anesthesia Post Note  Patient: [name]  Procedure(s) Performed: Procedure(s) (LRB): CARDIOVERSION (N/A)     Patient location during evaluation: Endoscopy Anesthesia Type: General Level of consciousness: awake Pain management: pain level controlled Vital Signs Assessment: post-procedure vital signs reviewed and stable Respiratory status: spontaneous breathing Cardiovascular status: stable Postop Assessment: no signs of nausea or vomiting Anesthetic complications: no    Last Vitals:  Vitals:   08/10/17 1300 08/10/17 1310  BP: 128/68 (!) 144/77  Pulse: (!) 56 (!) 59  Resp: 19 20  Temp: 36.4 C   SpO2: 95% 95%    Last Pain:  Vitals:   08/10/17 1300  TempSrc: Oral   Pain Goal:                 Genavie Boettger JR,JOHN Lacoya Wilbanks

## 2017-08-11 ENCOUNTER — Emergency Department (HOSPITAL_COMMUNITY): Payer: Medicare Other

## 2017-08-11 ENCOUNTER — Encounter (HOSPITAL_COMMUNITY): Payer: Self-pay | Admitting: Emergency Medicine

## 2017-08-11 ENCOUNTER — Inpatient Hospital Stay (HOSPITAL_COMMUNITY)
Admission: EM | Admit: 2017-08-11 | Discharge: 2017-08-18 | DRG: 242 | Disposition: A | Discharge status: Home / Self Care | Payer: Medicare Other | Attending: Internal Medicine | Admitting: Internal Medicine

## 2017-08-11 ENCOUNTER — Inpatient Hospital Stay (HOSPITAL_COMMUNITY): Payer: Medicare Other

## 2017-08-11 DIAGNOSIS — J810 Acute pulmonary edema: Secondary | ICD-10-CM

## 2017-08-11 DIAGNOSIS — I480 Paroxysmal atrial fibrillation: Secondary | ICD-10-CM | POA: Diagnosis present

## 2017-08-11 DIAGNOSIS — I959 Hypotension, unspecified: Secondary | ICD-10-CM | POA: Diagnosis not present

## 2017-08-11 DIAGNOSIS — I484 Atypical atrial flutter: Secondary | ICD-10-CM

## 2017-08-11 DIAGNOSIS — Z91048 Other nonmedicinal substance allergy status: Secondary | ICD-10-CM

## 2017-08-11 DIAGNOSIS — E039 Hypothyroidism, unspecified: Secondary | ICD-10-CM | POA: Diagnosis present

## 2017-08-11 DIAGNOSIS — I341 Nonrheumatic mitral (valve) prolapse: Secondary | ICD-10-CM | POA: Diagnosis present

## 2017-08-11 DIAGNOSIS — Z808 Family history of malignant neoplasm of other organs or systems: Secondary | ICD-10-CM | POA: Diagnosis not present

## 2017-08-11 DIAGNOSIS — I442 Atrioventricular block, complete: Secondary | ICD-10-CM | POA: Diagnosis not present

## 2017-08-11 DIAGNOSIS — I10 Essential (primary) hypertension: Secondary | ICD-10-CM | POA: Diagnosis not present

## 2017-08-11 DIAGNOSIS — E876 Hypokalemia: Secondary | ICD-10-CM | POA: Diagnosis not present

## 2017-08-11 DIAGNOSIS — Z885 Allergy status to narcotic agent status: Secondary | ICD-10-CM

## 2017-08-11 DIAGNOSIS — I5031 Acute diastolic (congestive) heart failure: Secondary | ICD-10-CM | POA: Diagnosis present

## 2017-08-11 DIAGNOSIS — K5900 Constipation, unspecified: Secondary | ICD-10-CM | POA: Diagnosis not present

## 2017-08-11 DIAGNOSIS — I110 Hypertensive heart disease with heart failure: Secondary | ICD-10-CM | POA: Diagnosis present

## 2017-08-11 DIAGNOSIS — H04123 Dry eye syndrome of bilateral lacrimal glands: Secondary | ICD-10-CM | POA: Diagnosis present

## 2017-08-11 DIAGNOSIS — Z853 Personal history of malignant neoplasm of breast: Secondary | ICD-10-CM

## 2017-08-11 DIAGNOSIS — I4891 Unspecified atrial fibrillation: Secondary | ICD-10-CM | POA: Diagnosis not present

## 2017-08-11 DIAGNOSIS — I509 Heart failure, unspecified: Secondary | ICD-10-CM | POA: Diagnosis present

## 2017-08-11 DIAGNOSIS — Z950 Presence of cardiac pacemaker: Secondary | ICD-10-CM

## 2017-08-11 DIAGNOSIS — Z79899 Other long term (current) drug therapy: Secondary | ICD-10-CM

## 2017-08-11 DIAGNOSIS — N3289 Other specified disorders of bladder: Secondary | ICD-10-CM | POA: Diagnosis present

## 2017-08-11 DIAGNOSIS — J849 Interstitial pulmonary disease, unspecified: Secondary | ICD-10-CM | POA: Diagnosis present

## 2017-08-11 DIAGNOSIS — I471 Supraventricular tachycardia: Secondary | ICD-10-CM | POA: Diagnosis present

## 2017-08-11 DIAGNOSIS — K219 Gastro-esophageal reflux disease without esophagitis: Secondary | ICD-10-CM | POA: Diagnosis present

## 2017-08-11 DIAGNOSIS — Z8249 Family history of ischemic heart disease and other diseases of the circulatory system: Secondary | ICD-10-CM

## 2017-08-11 DIAGNOSIS — Z7901 Long term (current) use of anticoagulants: Secondary | ICD-10-CM

## 2017-08-11 DIAGNOSIS — I495 Sick sinus syndrome: Secondary | ICD-10-CM | POA: Diagnosis present

## 2017-08-11 DIAGNOSIS — I440 Atrioventricular block, first degree: Secondary | ICD-10-CM | POA: Diagnosis present

## 2017-08-11 DIAGNOSIS — I5041 Acute combined systolic (congestive) and diastolic (congestive) heart failure: Secondary | ICD-10-CM

## 2017-08-11 DIAGNOSIS — Z91040 Latex allergy status: Secondary | ICD-10-CM | POA: Diagnosis not present

## 2017-08-11 DIAGNOSIS — Z803 Family history of malignant neoplasm of breast: Secondary | ICD-10-CM

## 2017-08-11 DIAGNOSIS — I481 Persistent atrial fibrillation: Principal | ICD-10-CM | POA: Diagnosis present

## 2017-08-11 DIAGNOSIS — I872 Venous insufficiency (chronic) (peripheral): Secondary | ICD-10-CM | POA: Diagnosis present

## 2017-08-11 DIAGNOSIS — Z90710 Acquired absence of both cervix and uterus: Secondary | ICD-10-CM | POA: Diagnosis not present

## 2017-08-11 DIAGNOSIS — Z79890 Hormone replacement therapy (postmenopausal): Secondary | ICD-10-CM

## 2017-08-11 LAB — BASIC METABOLIC PANEL
Anion gap: 7 (ref 5–15)
BUN: 18 mg/dL (ref 6–20)
CALCIUM: 8.9 mg/dL (ref 8.9–10.3)
CO2: 26 mmol/L (ref 22–32)
CREATININE: 0.82 mg/dL (ref 0.44–1.00)
Chloride: 109 mmol/L (ref 101–111)
GFR calc Af Amer: >60 mL/min (ref >60)
GLUCOSE: 111 mg/dL — AB (ref 65–99)
Potassium: 3.9 mmol/L (ref 3.5–5.1)
Sodium: 142 mmol/L (ref 135–145)

## 2017-08-11 LAB — TSH: TSH: 7.18 u[IU]/mL — AB (ref 0.350–4.500)

## 2017-08-11 LAB — CBC WITH DIFFERENTIAL/PLATELET
BASOS ABS: 0 10*3/uL (ref 0.0–0.1)
Basophils Relative: 0 %
EOS PCT: 3 %
Eosinophils Absolute: 0.2 10*3/uL (ref 0.0–0.7)
HEMATOCRIT: 36.5 % (ref 36.0–46.0)
Hemoglobin: 11.6 g/dL — ABNORMAL LOW (ref 12.0–15.0)
LYMPHS PCT: 23 %
Lymphs Abs: 1.2 10*3/uL (ref 0.7–4.0)
MCH: 29 pg (ref 26.0–34.0)
MCHC: 31.8 g/dL (ref 30.0–36.0)
MCV: 91.3 fL (ref 78.0–100.0)
MONO ABS: 0.3 10*3/uL (ref 0.1–1.0)
MONOS PCT: 5 %
NEUTROS ABS: 3.6 10*3/uL (ref 1.7–7.7)
Neutrophils Relative %: 69 %
PLATELETS: 197 10*3/uL (ref 150–400)
RBC: 4 MIL/uL (ref 3.87–5.11)
RDW: 14.5 % (ref 11.5–15.5)
WBC: 5.2 10*3/uL (ref 4.0–10.5)

## 2017-08-11 LAB — D-DIMER, QUANTITATIVE: D-Dimer, Quant: 0.75 ug/mL-FEU — ABNORMAL HIGH (ref 0.00–0.50)

## 2017-08-11 LAB — MAGNESIUM: MAGNESIUM: 1.9 mg/dL (ref 1.7–2.4)

## 2017-08-11 LAB — I-STAT ARTERIAL BLOOD GAS, ED
BICARBONATE: 23.6 mmol/L (ref 20.0–28.0)
O2 SAT: 93 %
PCO2 ART: 34.3 mmHg (ref 32.0–48.0)
PH ART: 7.446 (ref 7.350–7.450)
TCO2: 25 mmol/L (ref 22–32)
pO2, Arterial: 65 mmHg — ABNORMAL LOW (ref 83.0–108.0)

## 2017-08-11 LAB — PHOSPHORUS: Phosphorus: 3.4 mg/dL (ref 2.5–4.6)

## 2017-08-11 LAB — I-STAT TROPONIN, ED: Troponin i, poc: 0.01 ng/mL (ref 0.00–0.08)

## 2017-08-11 LAB — TROPONIN I
Troponin I: 0.03 ng/mL (ref <0.03)
Troponin I: <0.03 ng/mL (ref <0.03)

## 2017-08-11 LAB — MRSA PCR SCREENING: MRSA BY PCR: NEGATIVE

## 2017-08-11 LAB — ECHOCARDIOGRAM COMPLETE

## 2017-08-11 LAB — BRAIN NATRIURETIC PEPTIDE: B NATRIURETIC PEPTIDE 5: 574.4 pg/mL — AB (ref 0.0–100.0)

## 2017-08-11 MED ORDER — VITAMIN B-12 100 MCG PO TABS
100.0000 ug | ORAL_TABLET | Freq: Every day | ORAL | Status: DC
  Administered 2017-08-11 – 2017-08-17 (×7): 100 ug via ORAL
  Filled 2017-08-11 (×7): qty 1

## 2017-08-11 MED ORDER — CHLORHEXIDINE GLUCONATE 0.12 % MT SOLN
15.0000 mL | Freq: Two times a day (BID) | OROMUCOSAL | Status: DC
  Administered 2017-08-12 – 2017-08-17 (×11): 15 mL via OROMUCOSAL
  Filled 2017-08-11 (×11): qty 15

## 2017-08-11 MED ORDER — LEVOTHYROXINE SODIUM 75 MCG PO TABS
75.0000 ug | ORAL_TABLET | Freq: Every day | ORAL | Status: DC
  Administered 2017-08-11 – 2017-08-17 (×7): 75 ug via ORAL
  Filled 2017-08-11 (×7): qty 1

## 2017-08-11 MED ORDER — POLYVINYL ALCOHOL 1.4 % OP SOLN
Freq: Every day | OPHTHALMIC | Status: DC | PRN
  Filled 2017-08-11: qty 15

## 2017-08-11 MED ORDER — SODIUM CHLORIDE 0.9% FLUSH: 3.0000 mL | INTRAVENOUS | Status: DC | PRN

## 2017-08-11 MED ORDER — ONDANSETRON HCL 4 MG/2ML IJ SOLN: 4.0000 mg | Freq: Four times a day (QID) | INTRAMUSCULAR | Status: DC | PRN

## 2017-08-11 MED ORDER — HYDRALAZINE HCL 20 MG/ML IJ SOLN: 10.0000 mg | Freq: Three times a day (TID) | INTRAMUSCULAR | Status: DC | PRN

## 2017-08-11 MED ORDER — ACETAMINOPHEN 325 MG PO TABS: 650.0000 mg | ORAL_TABLET | ORAL | Status: DC | PRN

## 2017-08-11 MED ORDER — APIXABAN 5 MG PO TABS
5.0000 mg | ORAL_TABLET | Freq: Two times a day (BID) | ORAL | Status: DC
  Administered 2017-08-11 – 2017-08-16 (×12): 5 mg via ORAL
  Filled 2017-08-11 (×13): qty 1

## 2017-08-11 MED ORDER — PANTOPRAZOLE SODIUM 40 MG PO TBEC
40.0000 mg | DELAYED_RELEASE_TABLET | Freq: Every day | ORAL | Status: DC
  Administered 2017-08-11 – 2017-08-17 (×7): 40 mg via ORAL
  Filled 2017-08-11 (×7): qty 1

## 2017-08-11 MED ORDER — ORAL CARE MOUTH RINSE
15.0000 mL | Freq: Two times a day (BID) | OROMUCOSAL | Status: DC
  Administered 2017-08-12 – 2017-08-16 (×5): 15 mL via OROMUCOSAL

## 2017-08-11 MED ORDER — ALBUTEROL SULFATE (2.5 MG/3ML) 0.083% IN NEBU: 2.5000 mg | INHALATION_SOLUTION | RESPIRATORY_TRACT | Status: DC | PRN

## 2017-08-11 MED ORDER — FUROSEMIDE 10 MG/ML IJ SOLN
40.0000 mg | Freq: Once | INTRAMUSCULAR | Status: AC
  Administered 2017-08-11: 40 mg via INTRAVENOUS
  Filled 2017-08-11: qty 4

## 2017-08-11 MED ORDER — OXYBUTYNIN CHLORIDE 5 MG PO TABS
5.0000 mg | ORAL_TABLET | Freq: Two times a day (BID) | ORAL | Status: DC
  Administered 2017-08-11 – 2017-08-17 (×13): 5 mg via ORAL
  Filled 2017-08-11 (×14): qty 1

## 2017-08-11 MED ORDER — LISINOPRIL 5 MG PO TABS
5.0000 mg | ORAL_TABLET | Freq: Every day | ORAL | Status: DC
  Administered 2017-08-11 – 2017-08-13 (×3): 5 mg via ORAL
  Filled 2017-08-11 (×3): qty 1

## 2017-08-11 MED ORDER — SODIUM CHLORIDE 0.9% FLUSH
3.0000 mL | Freq: Two times a day (BID) | INTRAVENOUS | Status: DC
  Administered 2017-08-11 – 2017-08-17 (×11): 3 mL via INTRAVENOUS

## 2017-08-11 MED ORDER — SODIUM CHLORIDE 0.9 % IV SOLN
250.0000 mL | INTRAVENOUS | Status: DC | PRN
  Administered 2017-08-12: 250 mL via INTRAVENOUS

## 2017-08-11 MED ORDER — METOPROLOL SUCCINATE ER 50 MG PO TB24
50.0000 mg | ORAL_TABLET | Freq: Every day | ORAL | Status: DC
  Administered 2017-08-13: 50 mg via ORAL
  Filled 2017-08-11 (×3): qty 1

## 2017-08-11 MED ORDER — FUROSEMIDE 10 MG/ML IJ SOLN
40.0000 mg | Freq: Two times a day (BID) | INTRAMUSCULAR | Status: DC
  Administered 2017-08-11 – 2017-08-13 (×4): 40 mg via INTRAVENOUS
  Filled 2017-08-11 (×5): qty 4

## 2017-08-11 NOTE — ED Notes (Signed)
ED Provider at bedside. 

## 2017-08-11 NOTE — ED Notes (Signed)
Respiratory at bedside.

## 2017-08-11 NOTE — ED Notes (Signed)
Pure wick placed.

## 2017-08-11 NOTE — ED Notes (Signed)
Pt given sandwich and sprite while waiting for dinner tray.

## 2017-08-11 NOTE — ED Notes (Signed)
Cardiology PA at bedside. 

## 2017-08-11 NOTE — ED Notes (Signed)
Page sent to Dr. Aggie Moats

## 2017-08-11 NOTE — ED Notes (Signed)
Echo at bedside

## 2017-08-11 NOTE — H&P (Signed)
[facility] History and Physical  [name] KZS:[id number] DOB: 1933/11/20 DOA: 08/11/2017  Referring physician:  PCP: Lajean Manes, MD   Chief Complaint: "I was sitting on the side of the bed because I couldn't breathe."  HPI: [name] is a 83 y.o. female  with past medical history significant for breast cancer, high blood pressure, migraines, proximal A. fib who had cardioversion by cardiology yesterday as an outpatient. Patient states that for the week leading up to the procedure she had been short of breath with low reserve. She would become extremely winded going up 6 stairs. Denies any fever, chills, cough. Patient was told postprocedure everything went fine and to call if any difficulties. Acutely around 3 AM patient developed shortness of breath. Very labored. Her husband activated EMS. EKG done by EMS showed atrial fib with RVR in the 150s. Patient brought to the emergency room for evaluation.  ED course: In the emergency room patient had a short rounds of A. fib with RVR. Then would return to sinus rhythm. X-ray showed acute CHF. Patient given Lasix and placed on BiPAP due to increased work of breathing. Cardiology consult. Hospitalist consulted for admission.   Review of Systems:  As per HPI otherwise 10 point review of systems negative.    Past Medical History:  Diagnosis Date  . Allergic rhinitis   . Anemia   . Breast cancer, left breast ([place]) 01/15/15   Invasive Mammary  . Chronic venous insufficiency   . Hematuria    negative workup - Dr. Reece Agar  . Hypertension   . Migraine headache   . MVP (mitral valve prolapse)   . PAF (paroxysmal atrial fibrillation) ([place])   . SVT (supraventricular tachycardia) ([place]) 06/2010   s/p AVNRT ablation  . Wears glasses    Past Surgical History:  Procedure Laterality Date  . A-FLUTTER ABLATION N/A 02/04/2017   Procedure: A-Flutter Ablation;  Surgeon: Evans Lance, MD;  Location: [facility];  Service:  Cardiovascular;  Laterality: N/A;  . ABDOMINAL HYSTERECTOMY    . BREAST LUMPECTOMY WITH RADIOACTIVE SEED LOCALIZATION Left 02/14/2015   Procedure: BREAST LUMPECTOMY WITH RADIOACTIVE SEED LOCALIZATION;  Surgeon: Alphonsa Overall, MD;  Location: [facility];  Service: General;  Laterality: Left;  . BREAST SURGERY  1990   rt br bx  . CARDIAC CATHETERIZATION  2011   ablasion  . COLONOSCOPY    . DILATION AND CURETTAGE OF UTERUS    . Left Breast Biopsy  01/15/15  . TUBAL LIGATION     Social History:  reports that she has never smoked. She has never used smokeless tobacco. She reports that she drinks alcohol. She reports that she does not use drugs.  Allergies  Allergen Reactions  . Adhesive [Tape] Rash    Including EKG electrodes and the like.  . Codeine Other (See Comments)    unknown  . Latex Itching  . Percodan [Oxycodone-Aspirin] Nausea And Vomiting    Nausea     Family History  Problem Relation Age of Onset  . Hypertension Mother   . Heart disease Mother   . Arthritis Mother   . Other Father        hardening of the arteries  . Heart Problems Sister        pacemaker  . Heart attack Brother   . Throat cancer Paternal Uncle   . Breast cancer Sister        early 18s  . Alzheimer's disease Sister   .  Pulmonary disease Sister   . Breast cancer Daughter 25       Negative genetic testing   . Breast cancer Paternal Aunt        dx over 61  . Cancer Paternal Aunt        cancer in her leg  . Breast cancer Cousin        multiple paternal cousin     Prior to Admission medications   Medication Sig Start Date End Date Taking? Authorizing Provider  Carboxymethylcellul-Glycerin (LUBRICATING EYE DROPS OP) Apply 1 drop to eye daily as needed (dry eyes).    [provider]  ELIQUIS 5 MG TABS tablet TAKE 1 TABLET BY MOUTH TWO  TIMES DAILY 04/13/17   Evans Lance, MD  levothyroxine (SYNTHROID, LEVOTHROID) 75 MCG tablet Take 75 mcg by mouth daily before breakfast.     [provider]  losartan (COZAAR) 50 MG tablet Take 1 tablet (50 mg total) by mouth daily. 04/12/17 08/09/17  Evans Lance, MD  metoprolol succinate (TOPROL-XL) 50 MG 24 hr tablet Take 1 tablet (50 mg total) by mouth daily. 01/19/17   Bhagat, Crista Luria, PA  oxybutynin (DITROPAN) 5 MG tablet Take 5 mg by mouth 2 (two) times daily. 01/18/17   [provider]  pantoprazole (PROTONIX) 40 MG tablet Take 40 mg by mouth daily.  03/12/14   [provider]  vitamin B-12 (CYANOCOBALAMIN) 100 MCG tablet Take 100 mcg by mouth daily.    [provider]  Vitamin D, Ergocalciferol, (DRISDOL) 50000 units CAPS capsule Take 50,000 Units by mouth every 7 (seven) days.    [provider]   Physical Exam: Vitals:   08/11/17 0730 08/11/17 0755 08/11/17 0813 08/11/17 0815  BP: (!) 158/91  (!) 143/88 (!) 156/85  Pulse: (!) 51 63 (!) 58 (!) 55  Resp: 18 (!) 30 (!) 25 (!) 28  Temp:      TempSrc:      SpO2: 92% 94% 100% 100%    Wt Readings from Last 3 Encounters:  08/10/17 73.5 kg (162 lb)  08/09/17 73.8 kg (162 lb 12.8 oz)  08/04/17 73.6 kg (162 lb 3.2 oz)    General:  Appears calm and In respiratory distress, on BiPAP Eyes:  PERRL, EOMI, normal lids, iris ENT:  grossly normal hearing, lips & tongue Neck:  no LAD, masses or thyromegaly Cardiovascular:  RRR, no m/r/g. No LE edema.  Respiratory:  CTA bilaterally, no w/r/r. Incr respiratory effort. Abdomen:  soft, ntnd Skin:  no rash or induration seen on limited exam Musculoskeletal:  grossly normal tone BUE/BLE Psychiatric:  grossly normal mood and affect, speech fluent and appropriate Neurologic:  CN 2-12 grossly intact, moves all extremities in coordinated fashion.          Labs on Admission:  Basic Metabolic Panel:  Recent Labs Lab 08/09/17 0915 08/11/17 0602  NA 141 142  K 4.0 3.9  CL 108 109  CO2 26 26  GLUCOSE 109* 111*  BUN 12 18  CREATININE 0.82 0.82  CALCIUM 9.1 8.9   Liver Function  Tests: No results for input(s): AST, ALT, ALKPHOS, BILITOT, PROT, ALBUMIN in the last 168 hours. No results for input(s): LIPASE, AMYLASE in the last 168 hours. No results for input(s): AMMONIA in the last 168 hours. CBC:  Recent Labs Lab 08/09/17 0915 08/11/17 0602  WBC 4.9 5.2  NEUTROABS  --  3.6  HGB 12.0 11.6*  HCT 37.5 36.5  MCV 91.2 91.3  PLT 194  197   Cardiac Enzymes: No results for input(s): CKTOTAL, CKMB, CKMBINDEX, TROPONINI in the last 168 hours.  BNP (last 3 results)  Recent Labs  08/11/17 0602  BNP 574.4*    ProBNP (last 3 results) No results for input(s): PROBNP in the last 8760 hours.   Serum creatinine: 0.82 mg/dL 08/11/17 0602 Estimated creatinine clearance: 52.4 mL/min  CBG: No results for input(s): GLUCAP in the last 168 hours.  Radiological Exams on Admission: Dg Chest Port 1 View  Result Date: 08/11/2017 CLINICAL DATA:  Shortness of breath. EXAM: PORTABLE CHEST 1 VIEW COMPARISON:  CT 02/17/2017 . FINDINGS: Mediastinum hilar structures normal. Cardiomegaly with mild bilateral from interstitial prominence and small bilateral pleural effusions. Findings suggest CHF. No pneumothorax. IMPRESSION: Congestive heart failure with mild bilateral pulmonary interstitial edema and small bilateral pleural effusions. Electronically Signed   By: Marcello Moores  Register   On: 08/11/2017 06:27    EKG: Independently reviewed. NSR. No STEMI. Poor tracing.  Assessment/Plan Principal Problem:   Acute diastolic CHF (congestive heart failure) (HCC) Active Problems:   Essential hypertension   PAF (paroxysmal atrial fibrillation) (HCC)   Hypothyroidism   ILD (interstitial lung disease) (HCC)   Acute heart failure Serial troponin, initial neg BNP 574 Diuresis, given 40mg  IV in ED Fluid seen on CXR Echo ordered Ins and outs Continuous pulse ox Cardiology consult by EDP  Hypertension When necessary hydralazine 10 mg IV as needed for severe blood  pressure  Paroxysmal Afib Cont Toprol XL Eliquis  Hypothyroidism Cont OP synthroid 75 mcg qd No signs of hyper or hypothyroidism  ILD Prn albuterol  Dry eyes Cont eye drops  Bladder spasms Cont ditropan  GERD Cont PPI  Code Status: FULL  DVT Prophylaxis: eliquis Family Communication: husband at bedside Disposition Plan: Pending Improvement  Status: inpt sdu  Elwin Mocha, MD Family Medicine [facility] [email or web address] Password [email or web address]

## 2017-08-11 NOTE — ED Notes (Signed)
Placed patient on the wick for urination when needed patient is resting with family at bedside

## 2017-08-11 NOTE — ED Triage Notes (Signed)
Pt BIB EMS from home. Pt woken up with incr'd SOB. Hx of Afib with scheduled cardioversion here some time yesterday (8/29). EMS reports pt initially in A-fib, around 130-140s. Denies CP, lightheadedness, N/V/D. Vagal maneuvers ineffective at decr HR. Pt placed on 4L via [place] for comfort. En route, pt converted to SR in the 60s, but also c/o incr'd SOB.

## 2017-08-11 NOTE — Consult Note (Signed)
Cardiology Consultation:   Patient ID: [name]; [id number]; [birth year]   Admit date: 08/11/2017 Date of Consult: 08/11/2017  Primary Care Provider: Lajean Manes, MD Cardiologist:  [name] (last seen in 2015) EP: [name]   Patient Profile:   [name] is a 83 y.o. female with a hx of s/p AVNRT ablation with hx of SVT in 2011, HTN, MVP, PAF, L breast cancer (in remission - followed by Dr. Lindi Green) and chronic venous insufficiency who is being seen today for the evaluation of afib and CHF the request of [name].   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 Ablation was not attempted.She also had inducible AVNRT at a slower rate and evidence of AV conduction system disease.   Seen in afib clinic 07/14/17 with fatigue and shortness of breath. Noted in afib. She under Underwent successful cardioversion yesterday by [name].  History of Present Illness:   [name] woke up around 3 AM to go to the bathroom. Later she developed a severe palpitation with shortness of breath. EMS was called and found to have a A. fib RVR at a rate of 136 bpm. She converted to sinus rhythm in route. Maintaining sinus rhythm here.  EKG shows sinus rhythm. BNP elevated to 574. Point-of-care troponin negative. Troponin I less than 0.03. Electrolyte and kidney function normal. TSH elevated to 7.1. D-dimer 0.75. She is compliant with her Eliquis. Her breathing is resolved with IV lasix 40mg   x 1.   Past Medical History:  Diagnosis Date  . Allergic rhinitis   . Anemia   . Breast cancer, left breast ([place]) 01/15/15   Invasive Mammary  . Chronic venous insufficiency   . Hematuria    negative workup - Dr. Reece Green  . Hypertension   . Migraine headache   . MVP (mitral valve prolapse)   . PAF (paroxysmal atrial fibrillation) ([place])   . SVT (supraventricular  tachycardia) ([place]) 06/2010   s/p AVNRT ablation  . Wears glasses     Past Surgical History:  Procedure Laterality Date  . A-FLUTTER ABLATION N/A 02/04/2017   Procedure: A-Flutter Ablation;  Surgeon: Tracy Lance, MD;  Location: [place] [facility];  Service: Cardiovascular;  Laterality: N/A;  . ABDOMINAL HYSTERECTOMY    . BREAST LUMPECTOMY WITH RADIOACTIVE SEED LOCALIZATION Left 02/14/2015   Procedure: BREAST LUMPECTOMY WITH RADIOACTIVE SEED LOCALIZATION;  Surgeon: Tracy Overall, MD;  Location: [place];  Service: General;  Laterality: Left;  . BREAST SURGERY  1990   rt br bx  . CARDIAC CATHETERIZATION  2011   ablasion  . COLONOSCOPY    . DILATION AND CURETTAGE OF UTERUS    . Left Breast Biopsy  01/15/15  . TUBAL LIGATION        Inpatient Medications: Scheduled Meds: . apixaban  5 mg Oral BID  . furosemide  40 mg Intravenous Q12H  . levothyroxine  75 mcg Oral QAC breakfast  . lisinopril  5 mg Oral Daily  . metoprolol succinate  50 mg Oral Daily  . oxybutynin  5 mg Oral BID  . pantoprazole  40 mg Oral Daily  . sodium chloride flush  3 mL Intravenous Q12H  . vitamin B-12  100 mcg Oral Daily   Continuous Infusions: . sodium chloride     PRN Meds: sodium chloride, acetaminophen, albuterol, hydrALAZINE, ondansetron (ZOFRAN)  IV, polyvinyl alcohol, sodium chloride flush  Allergies:    Allergies  Allergen Reactions  . Adhesive [Tape] Rash    Including EKG electrodes and the like.  . Codeine Other (See Comments)    unknown  . Latex Itching  . Percodan [Oxycodone-Aspirin] Nausea And Vomiting    Nausea     Social History:   Social History   Social History  . Marital status: Married    Spouse name: N/A  . Number of children: 3  . Years of education: N/A   Occupational History  . Not on file.   Social History Main Topics  . Smoking status: Never Smoker  . Smokeless tobacco: Never Used  . Alcohol use 0.0 oz/week     Comment: once a week  . Drug use:  No  . Sexual activity: Not on file   Other Topics Concern  . Not on file   Social History Narrative   LIves with husband.    Retired.     Family History:    Family History  Problem Relation Age of Onset  . Hypertension Mother   . Heart disease Mother   . Arthritis Mother   . Other Father        hardening of the arteries  . Heart Problems Sister        pacemaker  . Heart attack Brother   . Throat cancer Paternal Uncle   . Breast cancer Sister        early 67s  . Alzheimer's disease Sister   . Pulmonary disease Sister   . Breast cancer Daughter 76       Negative genetic testing   . Breast cancer Paternal Aunt        dx over 59  . Cancer Paternal Aunt        cancer in her leg  . Breast cancer Cousin        multiple paternal cousin     ROS:  Please see the history of present illness.  ROS  All other ROS reviewed and negative.     Physical Exam/Data:   Vitals:   08/11/17 1215 08/11/17 1230 08/11/17 1245 08/11/17 1300  BP: 121/64 126/73 128/71 133/79  Pulse: (!) 58 (!) 54 61 (!) 55  Resp: (!) 22 15 (!) 25 17  Temp:      TempSrc:      SpO2: 96% 97% 99% 99%   No intake or output data in the 24 hours ending 08/11/17 1321 There were no vitals filed for this visit. There is no height or weight on file to calculate BMI.  General:  Well nourished, well developed, in no acute distress HEENT: normal Lymph: no adenopathy Neck: no JVD Endocrine:  No thryomegaly Vascular: No carotid bruits; FA pulses 2+ bilaterally without bruits  Cardiac:  normal S1, S2; RRR; no murmur Lungs:  Faint rales  Abd: soft, nontender, no hepatomegaly  Ext: no edema Musculoskeletal:  No deformities, BUE and BLE strength normal and equal Skin: warm and dry  Neuro:  CNs 2-12 intact, no focal abnormalities noted Psych:  Normal affect    Relevant CV Studies:  Echo 01/2015 Study Conclusions  - Left ventricle: The cavity size was normal. There was moderate   concentric hypertrophy.  Systolic function was moderately to   severely reduced. The estimated ejection fraction was in the   range of 30% to 35%. Wall motion was normal; there were no   regional wall motion abnormalities. - Aortic root: The  aortic root was normal in size. - Ascending aorta: The ascending aorta was mildly dilated maesuring   41 mm. - Mitral valve: Calcified annulus. Mildly thickened leaflets .   There was mild regurgitation. - Left atrium: The atrium was mildly dilated. - Right ventricle: Systolic function was normal. - Tricuspid valve: There was mild regurgitation. - Pulmonary arteries: Systolic pressure was within the normal   range. - Inferior vena cava: The vessel was normal in size. - Pericardium, extracardiac: There was no pericardial effusion.  Impressions:  - When compared to the prior study from 04/20/2010 LVEF has decreased   from 55-60% to 30-35%.  ECG from EMS shows regular supraventricular rhythm, likely left atrial flutter with rate of approximately 150 bpm; ECG shows sinus rhythm with PACs and long PR interval. No acute repolarization abnormalities, high normal QT 463 ms.  Laboratory Data:  Chemistry Recent Labs Lab 08/09/17 0915 08/11/17 0602  NA 141 142  K 4.0 3.9  CL 108 109  CO2 26 26  GLUCOSE 109* 111*  BUN 12 18  CREATININE 0.82 0.82  CALCIUM 9.1 8.9  GFRNONAA >60 >60  GFRAA >60 >60  ANIONGAP 7 7    No results for input(s): PROT, ALBUMIN, AST, ALT, ALKPHOS, BILITOT in the last 168 hours. Hematology Recent Labs Lab 08/09/17 0915 08/11/17 0602  WBC 4.9 5.2  RBC 4.11 4.00  HGB 12.0 11.6*  HCT 37.5 36.5  MCV 91.2 91.3  MCH 29.2 29.0  MCHC 32.0 31.8  RDW 14.5 14.5  PLT 194 197   Cardiac Enzymes Recent Labs Lab 08/11/17 0602  TROPONINI <0.03    Recent Labs Lab 08/11/17 0610  TROPIPOC 0.01    BNP Recent Labs Lab 08/11/17 0602  BNP 574.4*    DDimer  Recent Labs Lab 08/11/17 0602  DDIMER 0.75*    Radiology/Studies:  Dg Chest Port  1 View  Result Date: 08/11/2017 CLINICAL DATA:  Shortness of breath. EXAM: PORTABLE CHEST 1 VIEW COMPARISON:  CT 02/17/2017 . FINDINGS: Mediastinum hilar structures normal. Cardiomegaly with mild bilateral from interstitial prominence and small bilateral pleural effusions. Findings suggest CHF. No pneumothorax. IMPRESSION: Congestive heart failure with mild bilateral pulmonary interstitial edema and small bilateral pleural effusions. Electronically Signed   By: Marcello Moores  Register   On: 08/11/2017 06:27    Assessment and Plan:   1. Atrial fibrillation, paroxysmal: ECG from EMS that she suggests likely left atrial flutter (as postulated in past, likely mitral annular reentry). Spontaneously resolved. In the emergency room she has had 2 more brief episodes of atrial arrhythmia lasting only for 2-4 minutes each. These are asymptomatic, but continued to occur despite increase beta blocker dose. Currently in sinus bradycardia with long PR interval.. She has a high likelihood of recurrent symptomatic sustained persistent arrhythmia and will probably require antiarrhythmics. Use of antiarrhythmics could be associated with even higher grade conduction abnormalities and need for pacemaker therapy. Alternatively, consider repeat attempt at ablation with transseptal puncture. Will defer choice of antiarrhythmic strategies to [name] tomorrow. CHADSVasc 4 or higher (H2, gender, CHF/LV dysfunction), on Eliquis. No history of stroke/TIA. 2. CHF: Previously was felt that she might have tachycardia related cardiomyopathy. A repeat echocardiogram will be performed today to see if LV ejection fraction has improved. If so, our choices of antiarrhythmics are broader. Currently improved following one dose of diuretic and Green clinical appearance is of euvolemic at rest. Suspect decompensation was related to tachycardia. 3. HTN:  Well-controlled   Signed, Bhagat,Bhavinkumar, PA  08/11/2017 1:21 PM

## 2017-08-11 NOTE — Progress Notes (Signed)
  Echocardiogram 2D Echocardiogram has been performed.  Tresa Res 08/11/2017, 2:51 PM

## 2017-08-11 NOTE — ED Provider Notes (Signed)
TIME SEEN: 5:51 AM  CHIEF COMPLAINT: Shortness of breath  HPI: Patient is an 83 year old female with history of atrial fibrillation, SVT status post ablation, mitral valve prolapse, hypertension, previous history of left breast cancer who presents emergency department with shortness of breath. States short of breath woke her from sleep at 3 AM. She does not wear oxygen at home. No history of asthma, COPD or previous tobacco use. She denies any chest pain or chest discomfort. No nausea, vomiting, dizziness or diaphoresis. No fevers or cough. She has no known history of CHF per her report But it does appear she has an EF of 30-35% on her last echocardiogram in February 2018. She denies being on diuretics. No history of PE or DVT. No lower extremity swelling or pain. She had a cardioversion for atrial fibrillation yesterday with Dr. Marlou Porch that as successful.  With EMS, patient had a run of atrial fibrillation with a heart rate in the 140s. She is now currently in a sinus rhythm but still feels short of breath. Oxygen saturation was EMS was 94% on room air.  ROS: See HPI Constitutional: no fever  Eyes: no drainage  ENT: no runny nose   Cardiovascular:  no chest pain  Resp:  SOB  GI: no vomiting GU: no dysuria Integumentary: no rash  Allergy: no hives  Musculoskeletal: no leg swelling  Neurological: no slurred speech ROS otherwise negative  PAST MEDICAL HISTORY/PAST SURGICAL HISTORY:  Past Medical History:  Diagnosis Date  . Allergic rhinitis   . Anemia   . Breast cancer, left breast ([place]) 01/15/15   Invasive Mammary  . Chronic venous insufficiency   . Hematuria    negative workup - Dr. Reece Agar  . Hypertension   . Migraine headache   . MVP (mitral valve prolapse)   . PAF (paroxysmal atrial fibrillation) ([place])   . SVT (supraventricular tachycardia) ([place]) 06/2010   s/p AVNRT ablation  . Wears glasses     MEDICATIONS:  Prior to Admission medications   Medication Sig Start Date End  Date Taking? Authorizing Provider  Carboxymethylcellul-Glycerin (LUBRICATING EYE DROPS OP) Apply 1 drop to eye daily as needed (dry eyes).    [provider]  ELIQUIS 5 MG TABS tablet TAKE 1 TABLET BY MOUTH TWO  TIMES DAILY 04/13/17   Evans Lance, MD  levothyroxine (SYNTHROID, LEVOTHROID) 75 MCG tablet Take 75 mcg by mouth daily before breakfast.    [provider]  losartan (COZAAR) 50 MG tablet Take 1 tablet (50 mg total) by mouth daily. 04/12/17 08/09/17  Evans Lance, MD  metoprolol succinate (TOPROL-XL) 50 MG 24 hr tablet Take 1 tablet (50 mg total) by mouth daily. 01/19/17   Bhagat, Crista Luria, PA  oxybutynin (DITROPAN) 5 MG tablet Take 5 mg by mouth 2 (two) times daily. 01/18/17   [provider]  pantoprazole (PROTONIX) 40 MG tablet Take 40 mg by mouth daily.  03/12/14   [provider]  vitamin B-12 (CYANOCOBALAMIN) 100 MCG tablet Take 100 mcg by mouth daily.    [provider]  Vitamin D, Ergocalciferol, (DRISDOL) 50000 units CAPS capsule Take 50,000 Units by mouth every 7 (seven) days.    [provider]    ALLERGIES:  Allergies  Allergen Reactions  . Adhesive [Tape] Rash    Including EKG electrodes and the like.  . Codeine Other (See Comments)    unknown  . Latex Itching  . Percodan [Oxycodone-Aspirin] Nausea And Vomiting    Nausea  SOCIAL HISTORY:  Social History  Substance Use Topics  . Smoking status: Never Smoker  . Smokeless tobacco: Never Used  . Alcohol use 0.0 oz/week     Comment: once a week    FAMILY HISTORY: Family History  Problem Relation Age of Onset  . Hypertension Mother   . Heart disease Mother   . Arthritis Mother   . Other Father        hardening of the arteries  . Heart Problems Sister        pacemaker  . Heart attack Brother   . Throat cancer Paternal Uncle   . Breast cancer Sister        early 62s  . Alzheimer's disease Sister   . Pulmonary disease Sister   . Breast cancer  Daughter 32       Negative genetic testing   . Breast cancer Paternal Aunt        dx over 93  . Cancer Paternal Aunt        cancer in her leg  . Breast cancer Cousin        multiple paternal cousin    EXAM: BP 134/88 (BP Location: Right Arm)   Pulse 61   Temp 97.7 F (36.5 C) (Oral)   Resp 20   SpO2 97%  CONSTITUTIONAL: Alert and oriented and responds appropriately to questions. Well-appearing; well-nourished HEAD: Normocephalic EYES: Conjunctivae clear, pupils appear equal, EOMI ENT: normal nose; moist mucous membranes, no JVD NECK: Supple, no meningismus, no nuchal rigidity, no LAD  CARD: RRR; S1 and S2 appreciated; no murmurs, no clicks, no rubs, no gallops RESP: patient appears short of breath, she is tachypneic and speaking short sentences, rales at her bases bilaterally, no rhonchi or wheezing, no hypoxia on room air ABD/GI: Normal bowel sounds; non-distended; soft, non-tender, no rebound, no guarding, no peritoneal signs, no hepatosplenomegaly BACK:  The back appears normal and is non-tender to palpation, there is no CVA tenderness EXT: Normal ROM in all joints; non-tender to palpation; no edema; normal capillary refill; no cyanosis, no calf tenderness or swelling    SKIN: Normal color for age and race; warm; no rash NEURO: Moves all extremities equally PSYCH: The patient's mood and manner are appropriate. Grooming and personal hygiene are appropriate.  MEDICAL DECISION MAKING: Patient here with likely CHF exacerbation. Possibly from having atrial fibrillation overnight. Now in sinus rhythm. No chest pain. No hypoxia but she does look like she is working to breathe. We'll obtain labs. Portable chest x-ray shows pulmonary edema. We'll give IV Lasix. Anticipate admission.  ED PROGRESS: Patient's troponin is negative. Hemoglobin normal. Electrolytes are normal. Age adjusted d-dimer is negative. Will discuss with medicine for admission. Her PCP is Dr. Felipa Eth with  [facility].   7:35 AM  Pt still appears SOB.  I feel she may benefit from BiPAP to help her with her work of breathing. She agrees. I have contacted respiratory therapy.   7:56 AM Discussed patient's case with hospitalist, Dr. Aggie Moats.  I have recommended admission and patient (and family if present) agree with this plan. Admitting physician will place admission orders.   I have also consulted cardiology. Spoke with Gae Bon, NP with cardiology and they will see the patient in consult.  I reviewed all nursing notes, vitals, pertinent previous records, EKGs, lab and urine results, imaging (as available).      EKG Interpretation  Date/Time:  Thursday August 11 2017 05:46:52 EDT Ventricular Rate:  62 PR Interval:    QRS  Duration: 95 QT Interval:  455 QTC Calculation: 463 R Axis:   -40 Text Interpretation:  Sinus rhythm Prolonged PR interval Left axis deviation Consider anterior infarct No significant change since last tracing Confirmed by Kamareon Sciandra, Cyril Mourning [phone]) on 08/11/2017 5:52:32 AM         CRITICAL CARE Performed by: Nyra Jabs   Total critical care time: 55 minutes  Critical care time was exclusive of separately billable procedures and treating other patients.  Critical care was necessary to treat or prevent imminent or life-threatening deterioration.  Critical care was time spent personally by me on the following activities: development of treatment plan with patient and/or surrogate as well as nursing, discussions with consultants, evaluation of patient's response to treatment, examination of patient, obtaining history from patient or surrogate, ordering and performing treatments and interventions, ordering and review of laboratory studies, ordering and review of radiographic studies, pulse oximetry and re-evaluation of patient's condition.    Halea Lieb, Delice Bison, DO 08/11/17 [phone]

## 2017-08-12 ENCOUNTER — Inpatient Hospital Stay (HOSPITAL_COMMUNITY): Payer: Medicare Other

## 2017-08-12 ENCOUNTER — Encounter (HOSPITAL_COMMUNITY): Payer: Self-pay | Admitting: Cardiology

## 2017-08-12 DIAGNOSIS — I481 Persistent atrial fibrillation: Principal | ICD-10-CM

## 2017-08-12 DIAGNOSIS — I5031 Acute diastolic (congestive) heart failure: Secondary | ICD-10-CM

## 2017-08-12 DIAGNOSIS — J810 Acute pulmonary edema: Secondary | ICD-10-CM

## 2017-08-12 LAB — BASIC METABOLIC PANEL
Anion gap: 8 (ref 5–15)
BUN: 13 mg/dL (ref 6–20)
CALCIUM: 8.9 mg/dL (ref 8.9–10.3)
CO2: 29 mmol/L (ref 22–32)
CREATININE: 0.81 mg/dL (ref 0.44–1.00)
Chloride: 103 mmol/L (ref 101–111)
Glucose, Bld: 101 mg/dL — ABNORMAL HIGH (ref 65–99)
Potassium: 2.9 mmol/L — ABNORMAL LOW (ref 3.5–5.1)
SODIUM: 140 mmol/L (ref 135–145)

## 2017-08-12 LAB — POTASSIUM: POTASSIUM: 3.7 mmol/L (ref 3.5–5.1)

## 2017-08-12 MED ORDER — POTASSIUM CHLORIDE 10 MEQ/100ML IV SOLN
10.0000 meq | INTRAVENOUS | Status: AC
  Administered 2017-08-12 (×2): 10 meq via INTRAVENOUS
  Filled 2017-08-12 (×2): qty 100

## 2017-08-12 MED ORDER — POTASSIUM CHLORIDE CRYS ER 20 MEQ PO TBCR
20.0000 meq | EXTENDED_RELEASE_TABLET | Freq: Once | ORAL | Status: AC
  Administered 2017-08-12: 20 meq via ORAL
  Filled 2017-08-12: qty 1

## 2017-08-12 NOTE — Discharge Instructions (Addendum)
Supplemental Discharge Instructions for  Pacemaker/Defibrillator Patients  Activity No heavy lifting or vigorous activity with your left/right arm for 6 to 8 weeks.  Do not raise your left/right arm above your head for one week.  Gradually raise your affected arm as drawn below.           __          08/22/17                     08/23/17                  08/24/17                  08/25/17  NO DRIVING for   1 week  ; you may begin driving on   8/67/61  .  WOUND CARE - Keep the wound area clean and dry.  Do not get this area wet for one week. No showers for one week; you may shower on   08/25/17  . - The tape/steri-strips on your wound will fall off; do not pull them off.  No bandage is needed on the site.  DO  NOT apply any creams, oils, or ointments to the wound area. - If you notice any drainage or discharge from the wound, any swelling or bruising at the site, or you develop a fever > 101? F after you are discharged home, call the office at once.  Special Instructions - You are still able to use cellular telephones; use the ear opposite the side where you have your pacemaker/defibrillator.  Avoid carrying your cellular phone near your device. - When traveling through airports, show security personnel your identification card to avoid being screened in the metal detectors.  Ask the security personnel to use the hand wand. - Avoid arc welding equipment, MRI testing (magnetic resonance imaging), TENS units (transcutaneous nerve stimulators).  Call the office for questions about other devices. - Avoid electrical appliances that are in poor condition or are not properly grounded. - Microwave ovens are safe to be near or to operate.    Information on my medicine - ELIQUIS (apixaban)  This medication education was reviewed with me or my healthcare representative as part of my discharge preparation.  The pharmacist that spoke with me during my hospital stay was:  Wayland Salinas, [facility]  Why was Eliquis prescribed for you? Eliquis was prescribed for you to reduce the risk of a blood clot forming that can cause a stroke if you have a medical condition called atrial fibrillation (a type of irregular heartbeat).  What do You need to know about Eliquis ? Take your Eliquis TWICE DAILY - one tablet in the morning and one tablet in the evening with or without food. If you have difficulty swallowing the tablet whole please discuss with your pharmacist how to take the medication safely.  Take Eliquis exactly as prescribed by your doctor and DO NOT stop taking Eliquis without talking to the doctor who prescribed the medication.  Stopping may increase your risk of developing a stroke.  Refill your prescription before you run out.  After discharge, you should have regular check-up appointments with your healthcare provider that is prescribing your Eliquis.  In the future your dose may need to be changed if your kidney function or weight changes by a significant amount or as you get older.  What do you do if you miss a dose? If you miss a  dose, take it as soon as you remember on the same day and resume taking twice daily.  Do not take more than one dose of ELIQUIS at the same time to make up a missed dose.  Important Safety Information A possible side effect of Eliquis is bleeding. You should call your healthcare provider right away if you experience any of the following: ? Bleeding from an injury or your nose that does not stop. ? Unusual colored urine (red or dark brown) or unusual colored stools (red or black). ? Unusual bruising for unknown reasons. ? A serious fall or if you hit your head (even if there is no bleeding).  Some medicines may interact with Eliquis and might increase your risk of bleeding or clotting while on Eliquis. To help avoid this, consult your healthcare provider or pharmacist prior to using any new prescription or non-prescription  medications, including herbals, vitamins, non-steroidal anti-inflammatory drugs (NSAIDs) and supplements.  This website has more information on Eliquis (apixaban): http://www.eliquis.com/eliquis/home

## 2017-08-12 NOTE — Progress Notes (Signed)
Patient had one brief episode of elevated HR into the 150's. Patient immediately converted back to SR/SB in the 50-60's. During episode patient remained asymptomatic. K this AM 2.9, primary team added supplementation.

## 2017-08-12 NOTE — Progress Notes (Addendum)
[facility]                                                                              Patient Demographics  [name], is a 83 y.o. female, DOB - 08/19/1933, QQI:[id number]  Admit date - 08/11/2017   Admitting Physician Elwin Mocha, MD  Outpatient Primary MD for the patient is Lajean Manes, MD  Outpatient specialists:   LOS - 1  days   Medical records reviewed and are as summarized below:    Chief Complaint  Patient presents with  . Shortness of Breath  . Atrial Fibrillation       Brief summary   Per admit note by Dr. Nanetta Batty is a 83 y.o. female  with past medical history significant for breast cancer, high blood pressure, migraines, proximal A. fib who had cardioversion by cardiology on 8/29 as an outpatient. Patient states that for the week leading up to the procedure she had been short of breath with low reserve. She would become extremely winded going up 6 stairs. Denied any fever, chills, cough. Patient was told postprocedure everything went fine and to call if any difficulties. Acutely around 3 AM patient developed shortness of breath and labored breathing. Patient was noted to be in atrial fib with RVR in the 150s per EKG by EMS. . In ED, patient had a short rounds of A. fib with RVR. Then would return to sinus rhythm. X-ray showed acute CHF. Patient given Lasix and placed on BiPAP due to increased work of breathing.   Assessment & Plan    Principal Problem:   Acute diastolic CHF (congestive heart failure) ([place])  - likely precipitated due to AFib with RVR - feeling better, still not at baseline, continue IV Lasix for diuresis, continue strict I's and O's and daily weights - Negative balance of 1.9 L - 2-D echo 8/30 showed EF of 55-60% with grade 2 diastolic dysfunction - Cardiology following - Potassium replacement  Active Problems:    Essential hypertension - currently stable, continue Lasix, beta blocker, ACE  inhibitor    PAF (paroxysmal atrial fibrillation) (HCC)with RVR - patient underwent cardioversion outpatient on 8/29, likely unsuccessful - Cardiology following,had short runs of atrial fib with RVR in the ED, recommended EP consult - currently rate controlled, continue beta blocker - [place] VASC score 4, on eliquis     Hypothyroidism - continue Synthroid    ILD (interstitial lung disease) ([place]) - Currently stable, no wheezing, continue nebs as needed  Hypokalemia - replace IV and oral  Code Status:  Full  DVT Prophylaxis:  eliquis  Family Communication: Discussed in detail with the patient, all imaging results, lab results explained to the patient    Disposition Plan:   Time Spent in minutes  35 minutes  Procedures:  2-D echo: eF 55-60% with grade 2 diastolic dysfunction  Consultants:   Cardiology  Antimicrobials:      Medications  Scheduled Meds: . apixaban  5 mg Oral BID  . chlorhexidine  15 mL Mouth Rinse BID  . furosemide  40 mg Intravenous Q12H  . levothyroxine  75 mcg Oral QAC breakfast  .  lisinopril  5 mg Oral Daily  . mouth rinse  15 mL Mouth Rinse q12n4p  . metoprolol succinate  50 mg Oral Daily  . oxybutynin  5 mg Oral BID  . pantoprazole  40 mg Oral Daily  . sodium chloride flush  3 mL Intravenous Q12H  . vitamin B-12  100 mcg Oral Daily   Continuous Infusions: . sodium chloride 250 mL (08/12/17 0523)   PRN Meds:.sodium chloride, acetaminophen, albuterol, hydrALAZINE, ondansetron (ZOFRAN) IV, polyvinyl alcohol, sodium chloride flush   Antibiotics   Anti-infectives    None        Subjective:   [name] was seen and examined today. Feels a lot better today, shortness of breath is improving.No chest pain. Patient denies dizziness,  abdominal pain, N/V/D/C, new weakness, numbess, tingling. No acute events overnight.    Objective:   Vitals:   08/12/17 0548 08/12/17 0741 08/12/17 0818 08/12/17 1035  BP: 123/79  132/74 (!) 91/55   Pulse: 80   82  Resp: 17     Temp: 98.3 F (36.8 C) 98.3 F (36.8 C)    TempSrc:  Oral    SpO2: 98%     Weight: 69.7 kg (153 lb 11.2 oz)     Height:        Intake/Output Summary (Last 24 hours) at 08/12/17 1108 Last data filed at 08/12/17 1047  Gross per 24 hour  Intake           456.83 ml  Output             2425 ml  Net         -1968.17 ml     Wt Readings from Last 3 Encounters:  08/12/17 69.7 kg (153 lb 11.2 oz)  08/10/17 73.5 kg (162 lb)  08/09/17 73.8 kg (162 lb 12.8 oz)     Exam  General: Alert and oriented x 3, NAD  Eyes: PERRLA, EOMI, Anicteric Sclera,  HEENT:  Atraumatic, normocephalic, normal oropharynx  Cardiovascular: S1 S2 auscultated, no rubs, murmurs or gallops. Regular rate and rhythm.  Respiratory: Clear to auscultation bilaterally, no wheezing, rales or rhonchi  Gastrointestinal: Soft, nontender, nondistended, + bowel sounds  Ext: no pedal edema bilaterally  Neuro: AAOx3, Cr N's II- XII. Strength 5/5 upper and lower extremities bilaterally, speech clear, sensations grossly intact  Musculoskeletal: No digital cyanosis, clubbing  Skin: No rashes  Psych: Normal affect and demeanor, alert and oriented x3    Data Reviewed:  I have personally reviewed following labs and imaging studies  Micro Results Recent Results (from the past 240 hour(s))  MRSA PCR Screening     Status: None   Collection Time: 08/11/17  7:01 PM  Result Value Ref Range Status   MRSA by PCR NEGATIVE NEGATIVE Final    Comment:        The GeneXpert MRSA Assay (FDA approved for NASAL specimens only), is one component of a comprehensive MRSA colonization surveillance program. It is not intended to diagnose MRSA infection nor to guide or monitor treatment for MRSA infections.     Radiology Reports Dg Chest 2 View  Result Date: 08/12/2017 CLINICAL DATA:  Shortness of breath.  CHF EXAM: CHEST  2 VIEW COMPARISON:  Yesterday FINDINGS: Cardiomegaly. Stable aortic  contours. Low volume chest with biapical pleural based scar like opacity. Small pleural effusions. No pulmonary edema seen today. IMPRESSION: Resolved pulmonary edema.  Small bilateral pleural effusion. Electronically Signed   By: Monte Fantasia M.D.   On: 08/12/2017  07:40   Dg Chest Port 1 View  Result Date: 08/11/2017 CLINICAL DATA:  Shortness of breath. EXAM: PORTABLE CHEST 1 VIEW COMPARISON:  CT 02/17/2017 . FINDINGS: Mediastinum hilar structures normal. Cardiomegaly with mild bilateral from interstitial prominence and small bilateral pleural effusions. Findings suggest CHF. No pneumothorax. IMPRESSION: Congestive heart failure with mild bilateral pulmonary interstitial edema and small bilateral pleural effusions. Electronically Signed   By: Marcello Moores  Register   On: 08/11/2017 06:27    Lab Data:  CBC:  Recent Labs Lab 08/09/17 0915 08/11/17 0602  WBC 4.9 5.2  NEUTROABS  --  3.6  HGB 12.0 11.6*  HCT 37.5 36.5  MCV 91.2 91.3  PLT 194 193   Basic Metabolic Panel:  Recent Labs Lab 08/09/17 0915 08/11/17 0602 08/12/17 0217  NA 141 142 140  K 4.0 3.9 2.9*  CL 108 109 103  CO2 26 26 29   GLUCOSE 109* 111* 101*  BUN 12 18 13   CREATININE 0.82 0.82 0.81  CALCIUM 9.1 8.9 8.9  MG  --  1.9  --   PHOS  --  3.4  --    GFR: Estimated Creatinine Clearance: 52.1 mL/min (by C-G formula based on SCr of 0.81 mg/dL). Liver Function Tests: No results for input(s): AST, ALT, ALKPHOS, BILITOT, PROT, ALBUMIN in the last 168 hours. No results for input(s): LIPASE, AMYLASE in the last 168 hours. No results for input(s): AMMONIA in the last 168 hours. Coagulation Profile: No results for input(s): INR, PROTIME in the last 168 hours. Cardiac Enzymes:  Recent Labs Lab 08/11/17 0602 08/11/17 1601 08/11/17 2054  TROPONINI <0.03 0.03* <0.03   BNP (last 3 results) No results for input(s): PROBNP in the last 8760 hours. HbA1C: No results for input(s): HGBA1C in the last 72 hours. CBG: No  results for input(s): GLUCAP in the last 168 hours. Lipid Profile: No results for input(s): CHOL, HDL, LDLCALC, TRIG, CHOLHDL, LDLDIRECT in the last 72 hours. Thyroid Function Tests:  Recent Labs  08/11/17 0602  TSH 7.180*   Anemia Panel: No results for input(s): VITAMINB12, FOLATE, FERRITIN, TIBC, IRON, RETICCTPCT in the last 72 hours. Urine analysis:    Component Value Date/Time   COLORURINE YELLOW 03/28/2010 2217   APPEARANCEUR CLOUDY (A) 03/28/2010 2217   LABSPEC 1.022 03/28/2010 2217   PHURINE 5.0 03/28/2010 2217   GLUCOSEU NEGATIVE 03/28/2010 2217   HGBUR LARGE (A) 03/28/2010 2217   [place] NEGATIVE 03/28/2010 2217   [place] NEGATIVE 03/28/2010 2217   PROTEINUR NEGATIVE 03/28/2010 2217   UROBILINOGEN 0.2 03/28/2010 2217   NITRITE NEGATIVE 03/28/2010 2217   LEUKOCYTESUR LARGE (A) 03/28/2010 2217     Ripudeep Rai M.D. [facility] 08/12/2017, 11:08 AM  Pager: [phone] Between 7am to 7pm - call Pager - [phone]  After 7pm go to [email or web address] - password [email or web address]  Call night coverage person covering after 7pm

## 2017-08-12 NOTE — Progress Notes (Signed)
Progress Note  Patient Name: [name] Date of Encounter: 08/12/2017  Primary Cardiologist: Turner/EP: Lovena Le  Subjective   Generally feels well. Dyspnea resolved.Roughly 1.3 L net diuresis yesterday. Brief episodes of atrial tachycardia/left atrial flutter overnight, asymptomatic and not sustained. Most of the time rhythm is sinus bradycardia with PACs and long PR interval. QT interval around 460 ms on yesterday's ECG. Potassium 2.9 (was 3.9 before diuresis yesterday). Normal creatinine clearance. Normal cardiac enzymes. Echocardiogram shows left ventricular systolic function has returned to normal (EF 55-60%, was 30-35% in February). Doppler shows expected findings of elevated left atrial pressure.  Inpatient Medications    Scheduled Meds: . apixaban  5 mg Oral BID  . chlorhexidine  15 mL Mouth Rinse BID  . furosemide  40 mg Intravenous Q12H  . levothyroxine  75 mcg Oral QAC breakfast  . lisinopril  5 mg Oral Daily  . mouth rinse  15 mL Mouth Rinse q12n4p  . metoprolol succinate  50 mg Oral Daily  . oxybutynin  5 mg Oral BID  . pantoprazole  40 mg Oral Daily  . sodium chloride flush  3 mL Intravenous Q12H  . vitamin B-12  100 mcg Oral Daily   Continuous Infusions: . sodium chloride 250 mL (08/12/17 0523)   PRN Meds: sodium chloride, acetaminophen, albuterol, hydrALAZINE, ondansetron (ZOFRAN) IV, polyvinyl alcohol, sodium chloride flush   Vital Signs    Vitals:   08/12/17 0009 08/12/17 0548 08/12/17 0741 08/12/17 0818  BP: 134/82 123/79  132/74  Pulse: 62 80    Resp: 16 17    Temp: 98.2 F (36.8 C) 98.3 F (36.8 C) 98.3 F (36.8 C)   TempSrc: Axillary  Oral   SpO2: 97% 98%    Weight:  153 lb 11.2 oz (69.7 kg)    Height:        Intake/Output Summary (Last 24 hours) at 08/12/17 1037 Last data filed at 08/12/17 1000  Gross per 24 hour  Intake           456.83 ml  Output             1650 ml  Net         -1193.17 ml   Filed Weights   08/11/17 1840  08/12/17 0548  Weight: 155 lb 11.2 oz (70.6 kg) 153 lb 11.2 oz (69.7 kg)    Telemetry    Predominantly sinus bradycardia with PACs, brief episodes of atrial tachyarrhythmia - Personally Reviewed  ECG    No new tracing - Personally Reviewed  Physical Exam  Calm, relaxed GEN: No acute distress.   Neck: No JVD Cardiac: RRR with occasional ectopy, no murmurs, rubs, or gallops.  Respiratory: Clear to auscultation bilaterally. GI: Soft, nontender, non-distended  MS: No edema; No deformity. Neuro:  Nonfocal  Psych: Normal affect   Labs    Chemistry Recent Labs Lab 08/09/17 0915 08/11/17 0602 08/12/17 0217  NA 141 142 140  K 4.0 3.9 2.9*  CL 108 109 103  CO2 26 26 29   GLUCOSE 109* 111* 101*  BUN 12 18 13   CREATININE 0.82 0.82 0.81  CALCIUM 9.1 8.9 8.9  GFRNONAA >60 >60 >60  GFRAA >60 >60 >60  ANIONGAP 7 7 8      Hematology Recent Labs Lab 08/09/17 0915 08/11/17 0602  WBC 4.9 5.2  RBC 4.11 4.00  HGB 12.0 11.6*  HCT 37.5 36.5  MCV 91.2 91.3  MCH 29.2 29.0  MCHC 32.0 31.8  RDW 14.5 14.5  PLT  194 197    Cardiac Enzymes Recent Labs Lab 08/11/17 0602 08/11/17 1601 08/11/17 2054  TROPONINI <0.03 0.03* <0.03    Recent Labs Lab 08/11/17 0610  TROPIPOC 0.01     BNP Recent Labs Lab 08/11/17 0602  BNP 574.4*     DDimer  Recent Labs Lab 08/11/17 0602  DDIMER 0.75*     Radiology    Dg Chest 2 View  Result Date: 08/12/2017 CLINICAL DATA:  Shortness of breath.  CHF EXAM: CHEST  2 VIEW COMPARISON:  Yesterday FINDINGS: Cardiomegaly. Stable aortic contours. Low volume chest with biapical pleural based scar like opacity. Small pleural effusions. No pulmonary edema seen today. IMPRESSION: Resolved pulmonary edema.  Small bilateral pleural effusion. Electronically Signed   By: Monte Fantasia M.D.   On: 08/12/2017 07:40   Dg Chest Port 1 View  Result Date: 08/11/2017 CLINICAL DATA:  Shortness of breath. EXAM: PORTABLE CHEST 1 VIEW COMPARISON:  CT  02/17/2017 . FINDINGS: Mediastinum hilar structures normal. Cardiomegaly with mild bilateral from interstitial prominence and small bilateral pleural effusions. Findings suggest CHF. No pneumothorax. IMPRESSION: Congestive heart failure with mild bilateral pulmonary interstitial edema and small bilateral pleural effusions. Electronically Signed   By: Marcello Moores  Register   On: 08/11/2017 06:27    Cardiac Studies  Echo 08/11/2017 - Left ventricle: The cavity size was normal. Systolic function was   normal. The estimated ejection fraction was in the range of 55%   to 60%. Wall motion was normal; there were no regional wall   motion abnormalities. Features are consistent with a pseudonormal   left ventricular filling pattern, with concomitant abnormal   relaxation and increased filling pressure (grade 2 diastolic   dysfunction). Doppler parameters are consistent with high   ventricular filling pressure. - Aortic valve: There was no regurgitation. - Mitral valve: Transvalvular velocity was within the normal range.   There was no evidence for stenosis. There was trivial   regurgitation. - Left atrium: The atrium was severely dilated. - Right ventricle: The cavity size was normal. Wall thickness was   normal. Systolic function was normal. - Tricuspid valve: There was no regurgitation. - Pericardium, extracardiac: There was a left pleural effusion.  Impressions:  - Compared with the echo 02/04/96, the systolic function has   improved.   Patient Profile     83 y.o. female with recurrent symptomatic atypical (left-sided) atrial flutter/atrial fibrillation, complicated by acute diastolic heart failure, previously complicated by tachycardia related cardiomyopathy (left ventricular systolic function now returned to normal). Mildly elevated TSH without overt hypothyroidism, on chronic levothyroxine supplement.  Assessment & Plan    1. AFlutter/ AFib: Unlikely to be successful ablation.  Antiarrhythmic therapy choices limited by the presence of sinus bradycardia and first-degree AV block. Request EP consult. Dofetilide would be a reasonable antiarrhythmic choice, but first have to replace her potassium. Note that she was not hypokalemic on admission, but this occurred after she received diuretics. CHADSVasc 4 or higher (age 66, gender, CHF/LV dysfunction), on Eliquis. No history of stroke/TIA. 2. 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 Current episode of heart failure is acute exacerbation of diastolic heart failure due to rapid arrhythmia. Improved with rhythm control and diuretics. 3. HTN: Excellent control  Signed, Sanda Klein, MD  08/12/2017, 10:37 AM

## 2017-08-12 NOTE — Consult Note (Signed)
Cardiology Consultation:   Patient ID: [name]; [id number]; 07/30/1933   Admit date: 08/11/2017 Date of Consult: 08/12/2017  Primary Care Provider: Lajean Manes, MD Primary Cardiologist: Dr. Radford Pax (2015) Primary Electrophysiologist:  Dr. Lovena Le   Patient Profile:   [name] is a 83 y.o. female with a hx of HTN, MVP,  chronic venous insufficiency, breast cancer (L) treated/remission, AVNRT ablated remotely, PAFlutter s/p EPS Feb 2018 was found to have left atrial (likely mitral annular reentry) flutter with a RVR and underwent DCCV. Ablation was not attempted.She also had inducible AVNRT at a slower rate and evidence of AV conduction system disease, more recently in f/u at AFib clinic felt have developed AFib as well,  who is being seen today for the evaluation of recurrent AFlutter at the request of Dr. Sallyanne Kuster.  History of Present Illness:   [name] was seen in the AFib clinic 8/28 with a week of feeling poorly with increasing fatigue, she was found to be in AFib and set up for DCCV which she did 08/10/17 restoring SR, unfortunately she developed over night sudden on set of palpitations/SOB, EMS was called, record states found in rapid AF that spontaneously converted en-route.  While in the ER she was observed to have brief episodes of atrial arrhythmia.  EP is being asked to evaluate for AAD/rhythm control options given prior known L sided arrhythmia, not ablated. She also has intrinsic lung disease as evidenced by DLCO being 50% of predicted. She is not on home O2.   LABS K+ 3.9 >> 2.9 (replaced) BUN/Creat  BNP 574 Trop I: <0.03, 0.03, <0.03 WBC 5.2 H/H 11/36 Plts 197 TSH 7.180 Mag 1.9  The patient states she does not feel palpitations, but gets unusually fatigued and winded easily.  No CP, near syncope or syncope.  She has not been ill, no recent change to her medicines and no noted trigger.  She is feeling better today, though has not been up and about.  Past  Medical History:  Diagnosis Date  . Allergic rhinitis   . Anemia   . Breast cancer, left breast ([place]) 01/15/15   Invasive Mammary  . Chronic venous insufficiency   . Hematuria    negative workup - Dr. Reece Agar  . Hypertension   . Migraine headache   . MVP (mitral valve prolapse)   . PAF (paroxysmal atrial fibrillation) ([place])   . SVT (supraventricular tachycardia) ([place]) 06/2010   s/p AVNRT ablation  . Wears glasses     Past Surgical History:  Procedure Laterality Date  . A-FLUTTER ABLATION N/A 02/04/2017   Procedure: A-Flutter Ablation;  Surgeon: Evans Lance, MD;  Location: [facility];  Service: Cardiovascular;  Laterality: N/A;  . ABDOMINAL HYSTERECTOMY    . BREAST LUMPECTOMY WITH RADIOACTIVE SEED LOCALIZATION Left 02/14/2015   Procedure: BREAST LUMPECTOMY WITH RADIOACTIVE SEED LOCALIZATION;  Surgeon: Alphonsa Overall, MD;  Location: [facility];  Service: General;  Laterality: Left;  . BREAST SURGERY  1990   rt br bx  . CARDIAC CATHETERIZATION  2011   ablasion  . CARDIOVERSION N/A 08/10/2017   Procedure: CARDIOVERSION;  Surgeon: Jerline Pain, MD;  Location: [facility];  Service: Cardiovascular;  Laterality: N/A;  . COLONOSCOPY    . DILATION AND CURETTAGE OF UTERUS    . Left Breast Biopsy  01/15/15  . TUBAL LIGATION       Home Medications:  Prior to Admission medications   Medication Sig Start Date End Date Taking?  Authorizing Provider  antiseptic oral rinse (BIOTENE) LIQD 15 mLs by Mouth Rinse route as needed for dry mouth.   Yes [provider]  ELIQUIS 5 MG TABS tablet TAKE 1 TABLET BY MOUTH TWO  TIMES DAILY Patient taking differently: TAKE 5mg  BY MOUTH TWO  TIMES DAILY 04/13/17  Yes Evans Lance, MD  levothyroxine (SYNTHROID, LEVOTHROID) 75 MCG tablet Take 75 mcg by mouth daily before breakfast.   Yes [provider]  losartan (COZAAR) 50 MG tablet Take 1 tablet (50 mg total) by mouth daily. 04/12/17 08/11/17 Yes Evans Lance, MD    metoprolol succinate (TOPROL-XL) 50 MG 24 hr tablet Take 1 tablet (50 mg total) by mouth daily. 01/19/17  Yes Bhagat, Bhavinkumar, PA  oxybutynin (DITROPAN) 5 MG tablet Take 5 mg by mouth 2 (two) times daily. 01/18/17  Yes [provider]  pantoprazole (PROTONIX) 40 MG tablet Take 40 mg by mouth daily.  03/12/14  Yes [provider]  vitamin B-12 (CYANOCOBALAMIN) 100 MCG tablet Take 100 mcg by mouth daily.   Yes [provider]  Vitamin D, Ergocalciferol, (DRISDOL) 50000 units CAPS capsule Take 50,000 Units by mouth every 7 (seven) days.   Yes [provider]  Carboxymethylcellul-Glycerin (LUBRICATING EYE DROPS OP) Apply 1 drop to eye daily as needed (dry eyes).    [provider]    Inpatient Medications: Scheduled Meds: . apixaban  5 mg Oral BID  . chlorhexidine  15 mL Mouth Rinse BID  . furosemide  40 mg Intravenous Q12H  . levothyroxine  75 mcg Oral QAC breakfast  . lisinopril  5 mg Oral Daily  . mouth rinse  15 mL Mouth Rinse q12n4p  . metoprolol succinate  50 mg Oral Daily  . oxybutynin  5 mg Oral BID  . pantoprazole  40 mg Oral Daily  . sodium chloride flush  3 mL Intravenous Q12H  . vitamin B-12  100 mcg Oral Daily   Continuous Infusions: . sodium chloride 250 mL (08/12/17 0523)   PRN Meds: sodium chloride, acetaminophen, albuterol, hydrALAZINE, ondansetron (ZOFRAN) IV, polyvinyl alcohol, sodium chloride flush  Allergies:    Allergies  Allergen Reactions  . Adhesive [Tape] Rash    Including EKG electrodes and the like.  . Codeine Other (See Comments)    unknown  . Latex Itching  . Percodan [Oxycodone-Aspirin] Nausea And Vomiting    Nausea     Social History:   Social History   Social History  . Marital status: Married    Spouse name: N/A  . Number of children: 3  . Years of education: N/A   Occupational History  . Not on file.   Social History Main Topics  . Smoking status: Never Smoker  . Smokeless tobacco: Never  Used  . Alcohol use 0.0 oz/week     Comment: once a week  . Drug use: No  . Sexual activity: Not on file   Other Topics Concern  . Not on file   Social History Narrative   LIves with husband.    Retired.     Family History:    Family History  Problem Relation Age of Onset  . Hypertension Mother   . Heart disease Mother   . Arthritis Mother   . Other Father        hardening of the arteries  . Heart Problems Sister        pacemaker  . Heart attack Brother   . Throat cancer Paternal Uncle   .  Breast cancer Sister        early 76s  . Alzheimer's disease Sister   . Pulmonary disease Sister   . Breast cancer Daughter 5       Negative genetic testing   . Breast cancer Paternal Aunt        dx over 57  . Cancer Paternal Aunt        cancer in her leg  . Breast cancer Cousin        multiple paternal cousin     ROS:  Please see the history of present illness.  ROS  All other ROS reviewed and negative.     Physical Exam/Data:   Vitals:   08/12/17 0818 08/12/17 1035 08/12/17 1131 08/12/17 1152  BP: 132/74 (!) 91/55  127/60  Pulse:  82    Resp:      Temp:   98.4 F (36.9 C)   TempSrc:   Oral   SpO2:    96%  Weight:      Height:        Intake/Output Summary (Last 24 hours) at 08/12/17 1427 Last data filed at 08/12/17 1321  Gross per 24 hour  Intake           456.83 ml  Output             2425 ml  Net         -1968.17 ml   Filed Weights   08/11/17 1840 08/12/17 0548  Weight: 155 lb 11.2 oz (70.6 kg) 153 lb 11.2 oz (69.7 kg)   Body mass index is 24.07 kg/m.  General:  Well nourished, well developed, in no acute distress HEENT: normal Lymph: no adenopathy Neck: no JVD Endocrine:  No thryomegaly Vascular: No carotid bruits Cardiac:  normal S1, S2; RRR; 1/6 SM, no rubs or gallops Lungs:  CTA b/l,  no wheezing, rhonchi or rales  Abd: soft, nontender Ext: no edema Musculoskeletal:  No deformities, age appropriate atrophy Skin: warm and dry  Neuro:  CNs  2-12 intact, no focal abnormalities noted Psych:  Normal affect   EKG:  The EKG was personally reviewed and demonstrates:   04/11/17 SR 62bpm, 1st degree AVblock, PR 217ms, QRS 43ms, QTc 438ms (measured by myself) 08/10/17 is SB 52bpm, 1st degree AVBlock, PR 332ms, QRS 76ms, QTc 422ms 08/10/17 is AFib 100bpm Telemetry:  Telemetry was personally reviewed and demonstrates:    Relevant CV Studies:  08/11/17 TTE Study Conclusions - Left ventricle: The cavity size was normal. Systolic function was   normal. The estimated ejection fraction was in the range of 55%   to 60%. Wall motion was normal; there were no regional wall   motion abnormalities. Features are consistent with a pseudonormal   left ventricular filling pattern, with concomitant abnormal   relaxation and increased filling pressure (grade 2 diastolic   dysfunction). Doppler parameters are consistent with high   ventricular filling pressure. - Aortic valve: There was no regurgitation. - Mitral valve: Transvalvular velocity was within the normal range.   There was no evidence for stenosis. There was trivial   regurgitation. - Left atrium: The atrium was severely dilated. 46mm - Right ventricle: The cavity size was normal. Wall thickness was   normal. Systolic function was normal. - Tricuspid valve: There was no regurgitation. - Pericardium, extracardiac: There was a left pleural effusion. Impressions: - Compared with the echo 3/61/44, the systolic function has   improved.   Laboratory Data:  Chemistry Recent Labs Lab 08/09/17  0915 08/11/17 0602 08/12/17 0217  NA 141 142 140  K 4.0 3.9 2.9*  CL 108 109 103  CO2 26 26 29   GLUCOSE 109* 111* 101*  BUN 12 18 13   CREATININE 0.82 0.82 0.81  CALCIUM 9.1 8.9 8.9  GFRNONAA >60 >60 >60  GFRAA >60 >60 >60  ANIONGAP 7 7 8     No results for input(s): PROT, ALBUMIN, AST, ALT, ALKPHOS, BILITOT in the last 168 hours. Hematology Recent Labs Lab 08/09/17 0915 08/11/17 0602   WBC 4.9 5.2  RBC 4.11 4.00  HGB 12.0 11.6*  HCT 37.5 36.5  MCV 91.2 91.3  MCH 29.2 29.0  MCHC 32.0 31.8  RDW 14.5 14.5  PLT 194 197   Cardiac Enzymes Recent Labs Lab 08/11/17 0602 08/11/17 1601 08/11/17 2054  TROPONINI <0.03 0.03* <0.03    Recent Labs Lab 08/11/17 0610  TROPIPOC 0.01    BNP Recent Labs Lab 08/11/17 0602  BNP 574.4*    DDimer  Recent Labs Lab 08/11/17 0602  DDIMER 0.75*    Radiology/Studies:  Dg Chest 2 View Result Date: 08/12/2017 CLINICAL DATA:  Shortness of breath.  CHF EXAM: CHEST  2 VIEW COMPARISON:  Yesterday FINDINGS: Cardiomegaly. Stable aortic contours. Low volume chest with biapical pleural based scar like opacity. Small pleural effusions. No pulmonary edema seen today. IMPRESSION: Resolved pulmonary edema.  Small bilateral pleural effusion. Electronically Signed   By: Monte Fantasia M.D.   On: 08/12/2017 07:40    Assessment and Plan:   1. Persistent AFib, known hx left sided AFlutter     CHA2DS2Vasc is at least 4, on Eliquis, appropriately dosed at 5mg   Symptomatic Barriers to rhythm control are dilated LA She has LA flutter known, and appears AF as well.  Baseline conduction system disease limits AAD tx options  Will d/w Dr. Lovena Le, likely Tikosyn (QTc is in 528'U), uncertain if AFib/flutter ablation is reasonable.  Dr. Lovena Le to see  2. Hypokalemia     Being addressed by IM  3. Fluid OL     Diuresis in progress  4. HTN     Looks OK         Signed, Charlynn Grimes  08/12/2017 2:27 PM  EP Attending  Patient seen and examined. I have seen her previously. I agree with the findings as documented above with minimal revision. The patient has symptomatic tachy-brady syndrome, left atrial myopathy and intrinsic lung disease though not yet on home oxygen. She has returned to NSR from atrial fib/flutter. Her exam reveals a well appearing 83 yo woman, NAD, clear lungs and RRR with no edema. She drops her oxygen sats  into the 80s.  Her options for AA drug therapy are limited and her advanced age and atriopathy make ablation of her atrial fib and flutter unlikely to be successful and at increased risk for complications and unsuccess. I have recommended DDD/His bundle pacing and AV node ablation. She is reflecting. We will plan this on Wednesday of next week. She can go home once she is euvolemic (very close). I have recommended she take an additional metoprolol if she has recurrent symptomatic heart racing. Replete potassium.  Mikle Bosworth.D.

## 2017-08-13 DIAGNOSIS — I480 Paroxysmal atrial fibrillation: Secondary | ICD-10-CM

## 2017-08-13 DIAGNOSIS — J849 Interstitial pulmonary disease, unspecified: Secondary | ICD-10-CM

## 2017-08-13 LAB — BASIC METABOLIC PANEL
Anion gap: 10 (ref 5–15)
BUN: 15 mg/dL (ref 6–20)
CALCIUM: 8.8 mg/dL — AB (ref 8.9–10.3)
CO2: 27 mmol/L (ref 22–32)
Chloride: 100 mmol/L — ABNORMAL LOW (ref 101–111)
Creatinine, Ser: 0.86 mg/dL (ref 0.44–1.00)
Glucose, Bld: 118 mg/dL — ABNORMAL HIGH (ref 65–99)
Potassium: 3.3 mmol/L — ABNORMAL LOW (ref 3.5–5.1)
SODIUM: 137 mmol/L (ref 135–145)

## 2017-08-13 MED ORDER — ACETAMINOPHEN 325 MG PO TABS: 325.0000 mg | ORAL_TABLET | ORAL | Status: DC | PRN

## 2017-08-13 MED ORDER — METOPROLOL TARTRATE 12.5 MG HALF TABLET
12.5000 mg | ORAL_TABLET | Freq: Two times a day (BID) | ORAL | Status: DC
  Administered 2017-08-13 – 2017-08-14 (×2): 12.5 mg via ORAL
  Filled 2017-08-13 (×2): qty 1

## 2017-08-13 MED ORDER — POTASSIUM CHLORIDE CRYS ER 20 MEQ PO TBCR
40.0000 meq | EXTENDED_RELEASE_TABLET | Freq: Every day | ORAL | Status: DC
  Administered 2017-08-13 – 2017-08-17 (×5): 40 meq via ORAL
  Filled 2017-08-13 (×5): qty 2

## 2017-08-13 MED ORDER — ACETAMINOPHEN 160 MG/5ML PO SOLN
325.0000 mg | ORAL | Status: DC | PRN
Start: 2017-08-13 — End: 2017-08-13

## 2017-08-13 MED ORDER — METOPROLOL TARTRATE 12.5 MG HALF TABLET
12.5000 mg | ORAL_TABLET | Freq: Once | ORAL | Status: AC
  Administered 2017-08-13: 12.5 mg via ORAL
  Filled 2017-08-13: qty 1

## 2017-08-13 MED ORDER — SODIUM CHLORIDE 0.9 % IV BOLUS (SEPSIS)
500.0000 mL | Freq: Once | INTRAVENOUS | Status: AC
  Administered 2017-08-13: 500 mL via INTRAVENOUS

## 2017-08-13 MED ORDER — ALUM & MAG HYDROXIDE-SIMETH 200-200-20 MG/5ML PO SUSP
30.0000 mL | ORAL | Status: DC | PRN
  Administered 2017-08-13 – 2017-08-14 (×3): 30 mL via ORAL
  Filled 2017-08-13 (×4): qty 30

## 2017-08-13 MED ORDER — POTASSIUM CHLORIDE CRYS ER 20 MEQ PO TBCR: 40.0000 meq | EXTENDED_RELEASE_TABLET | Freq: Every day | ORAL | Status: DC

## 2017-08-13 NOTE — Plan of Care (Signed)
Problem: Safety: Goal: Ability to remain free from injury will improve Outcome: Progressing Patient has remained free from injury. Bed alarm maintained. Call light within reach.   Problem: Physical Regulation: Goal: Ability to maintain clinical measurements within normal limits will improve Outcome: Progressing Patient briefly tachy in the 140s, heart rate currently in the 90s. SA with 1st degree AV block. Patient has remained asymptomatic. Covering primary provider notified, BMP to be drawn due to previous low K. BP has remained stable.   Problem: Fluid Volume: Goal: Ability to maintain a balanced intake and output will improve Outcome: Progressing 40mg  IV lasix given, I&O monitored

## 2017-08-13 NOTE — Progress Notes (Signed)
[facility]                                                                              Patient Demographics  [name], is a 83 y.o. female, DOB - 1933-06-27, ZOX:[id number]  Admit date - 08/11/2017   Admitting Physician Elwin Mocha, MD  Outpatient Primary MD for the patient is Lajean Manes, MD  Outpatient specialists:   LOS - 2  days   Medical records reviewed and are as summarized below:    Chief Complaint  Patient presents with  . Shortness of Breath  . Atrial Fibrillation       Brief summary   Per admit note by Dr. Nanetta Batty is a 83 y.o. female  with past medical history significant for breast cancer, high blood pressure, migraines, proximal A. fib who had cardioversion by cardiology on 8/29 as an outpatient. Patient states that for the week leading up to the procedure she had been short of breath with low reserve. She would become extremely winded going up 6 stairs. Denied any fever, chills, cough. Patient was told postprocedure everything went fine and to call if any difficulties. Acutely around 3 AM patient developed shortness of breath and labored breathing. Patient was noted to be in atrial fib with RVR in the 150s per EKG by EMS. . In ED, patient had a short rounds of A. fib with RVR. Then would return to sinus rhythm. X-ray showed acute CHF. Patient given Lasix and placed on BiPAP due to increased work of breathing.   Assessment & Plan    Principal Problem:   Acute diastolic CHF (congestive heart failure) ([place])  - likely precipitated due to AFib with RVR - feeling better, still not at baseline, continue IV Lasix for diuresis, continue strict I's and O's and daily weights - Negative balance of 2.9 L - 2-D echo 8/30 showed EF of 55-60% with grade 2 diastolic dysfunction - Cardiology following   Active Problems:    Essential hypertension - Now hypotensive, hold Lasix, beta blocker, ACE inhibitor    PAF (paroxysmal atrial  fibrillation) ([facility])with RVR, hypotension - patient underwent cardioversion outpatient on 8/29, likely unsuccessful - Cardiology following,had short runs of atrial fib with RVR in the ED, recommended EP consult - [place] VASC score 4, on eliquis  - discussed with Dr Debara Pickett, will give IV fluid to bring BP up, hold antihypertensives. Not a candidate for amiodarone due to lung disease.       Hypothyroidism - continue Synthroid    ILD (interstitial lung disease) ([facility]) - Currently stable, no wheezing, continue nebs as needed  Hypokalemia - replace oral  Code Status:  Full  DVT Prophylaxis:  eliquis  Family Communication: Discussed in detail with the patient, all imaging results, lab results explained to the patient    Disposition Plan:   Time Spent in minutes  35 minutes  Procedures:  2-D echo: eF 55-60% with grade 2 diastolic dysfunction  Consultants:   Cardiology  Antimicrobials:      Medications  Scheduled Meds: . apixaban  5 mg Oral BID  . chlorhexidine  15 mL Mouth Rinse BID  .  furosemide  40 mg Intravenous Q12H  . levothyroxine  75 mcg Oral QAC breakfast  . lisinopril  5 mg Oral Daily  . mouth rinse  15 mL Mouth Rinse q12n4p  . metoprolol succinate  50 mg Oral Daily  . oxybutynin  5 mg Oral BID  . pantoprazole  40 mg Oral Daily  . potassium chloride  40 mEq Oral Daily  . sodium chloride flush  3 mL Intravenous Q12H  . vitamin B-12  100 mcg Oral Daily   Continuous Infusions: . sodium chloride 250 mL (08/12/17 0523)   PRN Meds:.sodium chloride, acetaminophen, albuterol, hydrALAZINE, ondansetron (ZOFRAN) IV, polyvinyl alcohol, sodium chloride flush   Antibiotics   Anti-infectives    None        Subjective:   [name] was seen and examined today. Seen this am was fine, denied any CP or SOB. Subsequently BP low.  Patient denies dizziness,  abdominal pain, N/V/D/C, new weakness, numbess, tingling.   Objective:   Vitals:   08/13/17 0800 08/13/17 1100  08/13/17 1200 08/13/17 1206  BP: (!) 109/92 112/82 106/67 106/67  Pulse: 96     Resp: 20   18  Temp: 98.4 F (36.9 C)     TempSrc: Oral     SpO2: 95% 94%  96%  Weight:      Height:        Intake/Output Summary (Last 24 hours) at 08/13/17 1421 Last data filed at 08/13/17 0800  Gross per 24 hour  Intake              125 ml  Output             1095 ml  Net             -970 ml     Wt Readings from Last 3 Encounters:  08/13/17 68.9 kg (152 lb)  08/10/17 73.5 kg (162 lb)  08/09/17 73.8 kg (162 lb 12.8 oz)     Exam  General: Alert and oriented x 3, NAD Eyes: , HEENT:   Cardiovascular: S1 S2 auscultated, ireg ireg Respiratory: Clear to auscultation bilaterally, no wheezing, rales or rhonchi Gastrointestinal: Soft, nontender, nondistended, + bowel sounds Ext: no pedal edema bilaterally Neuro: no neuro deficits Musculoskeletal: No digital cyanosis, clubbing Skin: No rashes Psych: Normal affect and demeanor, alert and oriented x3      Data Reviewed:  I have personally reviewed following labs and imaging studies  Micro Results Recent Results (from the past 240 hour(s))  MRSA PCR Screening     Status: None   Collection Time: 08/11/17  7:01 PM  Result Value Ref Range Status   MRSA by PCR NEGATIVE NEGATIVE Final    Comment:        The GeneXpert MRSA Assay (FDA approved for NASAL specimens only), is one component of a comprehensive MRSA colonization surveillance program. It is not intended to diagnose MRSA infection nor to guide or monitor treatment for MRSA infections.     Radiology Reports Dg Chest 2 View  Result Date: 08/12/2017 CLINICAL DATA:  Shortness of breath.  CHF EXAM: CHEST  2 VIEW COMPARISON:  Yesterday FINDINGS: Cardiomegaly. Stable aortic contours. Low volume chest with biapical pleural based scar like opacity. Small pleural effusions. No pulmonary edema seen today. IMPRESSION: Resolved pulmonary edema.  Small bilateral pleural effusion.  Electronically Signed   By: Monte Fantasia M.D.   On: 08/12/2017 07:40   Dg Chest Port 1 View  Result Date: 08/11/2017 CLINICAL DATA:  Shortness of breath. EXAM: PORTABLE CHEST 1 VIEW COMPARISON:  CT 02/17/2017 . FINDINGS: Mediastinum hilar structures normal. Cardiomegaly with mild bilateral from interstitial prominence and small bilateral pleural effusions. Findings suggest CHF. No pneumothorax. IMPRESSION: Congestive heart failure with mild bilateral pulmonary interstitial edema and small bilateral pleural effusions. Electronically Signed   By: Marcello Moores  Register   On: 08/11/2017 06:27    Lab Data:  CBC:  Recent Labs Lab 08/09/17 0915 08/11/17 0602  WBC 4.9 5.2  NEUTROABS  --  3.6  HGB 12.0 11.6*  HCT 37.5 36.5  MCV 91.2 91.3  PLT 194 893   Basic Metabolic Panel:  Recent Labs Lab 08/09/17 0915 08/11/17 0602 08/12/17 0217 08/12/17 1753 08/13/17 0308  NA 141 142 140  --  137  K 4.0 3.9 2.9* 3.7 3.3*  CL 108 109 103  --  100*  CO2 26 26 29   --  27  GLUCOSE 109* 111* 101*  --  118*  BUN 12 18 13   --  15  CREATININE 0.82 0.82 0.81  --  0.86  CALCIUM 9.1 8.9 8.9  --  8.8*  MG  --  1.9  --   --   --   PHOS  --  3.4  --   --   --    GFR: Estimated Creatinine Clearance: 49 mL/min (by C-G formula based on SCr of 0.86 mg/dL). Liver Function Tests: No results for input(s): AST, ALT, ALKPHOS, BILITOT, PROT, ALBUMIN in the last 168 hours. No results for input(s): LIPASE, AMYLASE in the last 168 hours. No results for input(s): AMMONIA in the last 168 hours. Coagulation Profile: No results for input(s): INR, PROTIME in the last 168 hours. Cardiac Enzymes:  Recent Labs Lab 08/11/17 0602 08/11/17 1601 08/11/17 2054  TROPONINI <0.03 0.03* <0.03   BNP (last 3 results) No results for input(s): PROBNP in the last 8760 hours. HbA1C: No results for input(s): HGBA1C in the last 72 hours. CBG: No results for input(s): GLUCAP in the last 168 hours. Lipid Profile: No results  for input(s): CHOL, HDL, LDLCALC, TRIG, CHOLHDL, LDLDIRECT in the last 72 hours. Thyroid Function Tests:  Recent Labs  08/11/17 0602  TSH 7.180*   Anemia Panel: No results for input(s): VITAMINB12, FOLATE, FERRITIN, TIBC, IRON, RETICCTPCT in the last 72 hours. Urine analysis:    Component Value Date/Time   COLORURINE YELLOW 03/28/2010 2217   APPEARANCEUR CLOUDY (A) 03/28/2010 2217   LABSPEC 1.022 03/28/2010 2217   PHURINE 5.0 03/28/2010 2217   GLUCOSEU NEGATIVE 03/28/2010 2217   HGBUR LARGE (A) 03/28/2010 2217   [place] NEGATIVE 03/28/2010 2217   [place] NEGATIVE 03/28/2010 2217   PROTEINUR NEGATIVE 03/28/2010 2217   UROBILINOGEN 0.2 03/28/2010 2217   NITRITE NEGATIVE 03/28/2010 2217   LEUKOCYTESUR LARGE (A) 03/28/2010 2217     [name] M.D. [facility] 08/13/2017, 2:21 PM  Pager: [phone] Between 7am to 7pm - call Pager - [phone]  After 7pm go to [email or web address] - password [email or web address]  Call night coverage person covering after 7pm

## 2017-08-13 NOTE — Progress Notes (Signed)
DAILY PROGRESS NOTE   Patient Name: [name] Date of Encounter: 08/13/2017  Hospital Problem List   Principal Problem:   Acute diastolic CHF (congestive heart failure) (HCC) Active Problems:   Essential hypertension   PAF (paroxysmal atrial fibrillation) (HCC)   Hypothyroidism   ILD (interstitial lung disease) ([place])     Chief Complaint   Weak, dizzy  Subjective   [name] was noted to go back into A. fib with RVR later this morning and now is hypotensive and dizzy. She did receive her a.m. medications including lisinopril and an extra dose of metoprolol. Plan was for AV node ablation and pacemaker placement on Wednesday, but she is not in condition for discharge home.  Objective   Vitals:   08/13/17 0800 08/13/17 1100 08/13/17 1200 08/13/17 1206  BP: (!) 109/92 112/82 106/67 106/67  Pulse: 96     Resp: 20   18  Temp: 98.4 F (36.9 C)     TempSrc: Oral     SpO2: 95% 94%  96%  Weight:      Height:        Intake/Output Summary (Last 24 hours) at 08/13/17 1400 Last data filed at 08/13/17 0800  Gross per 24 hour  Intake              125 ml  Output             1095 ml  Net             -970 ml   Filed Weights   08/11/17 1840 08/12/17 0548 08/13/17 0452  Weight: 155 lb 11.2 oz (70.6 kg) 153 lb 11.2 oz (69.7 kg) 152 lb (68.9 kg)    Physical Exam   General appearance: alert and mild distress Neck: no carotid bruit, no JVD and thyroid not enlarged, symmetric, no tenderness/mass/nodules Lungs: clear to auscultation bilaterally Heart: irregularly irregular rhythm and Tachycardic Abdomen: soft, non-tender; bowel sounds normal; no masses,  no organomegaly Extremities: extremities normal, atraumatic, no cyanosis or edema Pulses: 2+ and symmetric Skin: Skin color, texture, turgor normal. No rashes or lesions Neurologic: Grossly normal Psych: Pleasant  Inpatient Medications    Scheduled Meds: . apixaban  5 mg Oral BID  . chlorhexidine  15 mL Mouth Rinse  BID  . furosemide  40 mg Intravenous Q12H  . levothyroxine  75 mcg Oral QAC breakfast  . lisinopril  5 mg Oral Daily  . mouth rinse  15 mL Mouth Rinse q12n4p  . metoprolol succinate  50 mg Oral Daily  . oxybutynin  5 mg Oral BID  . pantoprazole  40 mg Oral Daily  . potassium chloride  40 mEq Oral Daily  . sodium chloride flush  3 mL Intravenous Q12H  . vitamin B-12  100 mcg Oral Daily    Continuous Infusions: . sodium chloride 250 mL (08/12/17 0523)    PRN Meds: sodium chloride, acetaminophen, albuterol, hydrALAZINE, ondansetron (ZOFRAN) IV, polyvinyl alcohol, sodium chloride flush   Labs   Results for orders placed or performed during the hospital encounter of 08/11/17 (from the past 48 hour(s))  Troponin I (q 6hr x 3)     Status: Abnormal   Collection Time: 08/11/17  4:01 PM  Result Value Ref Range   Troponin I 0.03 (HH) <0.03 ng/mL    Comment: CRITICAL RESULT CALLED TO, READ BACK BY AND VERIFIED WITH: L BISHOP,RN [id number] 08/11/2017 WBOND   MRSA PCR Screening     Status: None   Collection Time:  08/11/17  7:01 PM  Result Value Ref Range   MRSA by PCR NEGATIVE NEGATIVE    Comment:        The GeneXpert MRSA Assay (FDA approved for NASAL specimens only), is one component of a comprehensive MRSA colonization surveillance program. It is not intended to diagnose MRSA infection nor to guide or monitor treatment for MRSA infections.   Troponin I (q 6hr x 3)     Status: None   Collection Time: 08/11/17  8:54 PM  Result Value Ref Range   Troponin I <0.03 <0.03 ng/mL  Basic metabolic panel     Status: Abnormal   Collection Time: 08/12/17  2:17 AM  Result Value Ref Range   Sodium 140 135 - 145 mmol/L   Potassium 2.9 (L) 3.5 - 5.1 mmol/L    Comment: DELTA CHECK NOTED   Chloride 103 101 - 111 mmol/L   CO2 29 22 - 32 mmol/L   Glucose, Bld 101 (H) 65 - 99 mg/dL   BUN 13 6 - 20 mg/dL   Creatinine, Ser 1.61 0.44 - 1.00 mg/dL   Calcium 8.9 8.9 - 44.3 mg/dL   GFR calc non Af  Amer >60 >60 mL/min   GFR calc Af Amer >60 >60 mL/min    Comment: (NOTE) The eGFR has been calculated using the CKD EPI equation. This calculation has not been validated in all clinical situations. eGFR's persistently <60 mL/min signify possible Chronic Kidney Disease.    Anion gap 8 5 - 15  Potassium     Status: None   Collection Time: 08/12/17  5:53 PM  Result Value Ref Range   Potassium 3.7 3.5 - 5.1 mmol/L    Comment: DELTA CHECK NOTED  Basic metabolic panel     Status: Abnormal   Collection Time: 08/13/17  3:08 AM  Result Value Ref Range   Sodium 137 135 - 145 mmol/L   Potassium 3.3 (L) 3.5 - 5.1 mmol/L   Chloride 100 (L) 101 - 111 mmol/L   CO2 27 22 - 32 mmol/L   Glucose, Bld 118 (H) 65 - 99 mg/dL   BUN 15 6 - 20 mg/dL   Creatinine, Ser 2.46 0.44 - 1.00 mg/dL   Calcium 8.8 (L) 8.9 - 10.3 mg/dL   GFR calc non Af Amer >60 >60 mL/min   GFR calc Af Amer >60 >60 mL/min    Comment: (NOTE) The eGFR has been calculated using the CKD EPI equation. This calculation has not been validated in all clinical situations. eGFR's persistently <60 mL/min signify possible Chronic Kidney Disease.    Anion gap 10 5 - 15    ECG   N/A  Telemetry   A. fib with RVR- Personally Reviewed  Radiology    Dg Chest 2 View  Result Date: 08/12/2017 CLINICAL DATA:  Shortness of breath.  CHF EXAM: CHEST  2 VIEW COMPARISON:  Yesterday FINDINGS: Cardiomegaly. Stable aortic contours. Low volume chest with biapical pleural based scar like opacity. Small pleural effusions. No pulmonary edema seen today. IMPRESSION: Resolved pulmonary edema.  Small bilateral pleural effusion. Electronically Signed   By: Marnee Spring M.D.   On: 08/12/2017 07:40    Cardiac Studies   N/A  Assessment   1. Principal Problem: 2.   Acute diastolic CHF (congestive heart failure) (HCC) 3. Active Problems: 4.   Essential hypertension 5.   PAF (paroxysmal atrial fibrillation) (HCC) 6.   Hypothyroidism 7.   ILD  (interstitial lung disease) (HCC) 8.   Plan  1. [name] is now back in A. fib with RVR is hypotensive. She received her a.m. blood pressure medicines. She is diuresed about 2-1/2 L. Blood pressure will not support rate control. We'll give a small fluid bolus to support blood pressure. Discontinue further lisinopril. Not a candidate for amiodarone due to interstitial lung disease. Plan was for AV node ablation and pacing next week. Hold further diuresis. Switch Toprol-XL to short acting metoprolol starting after her saline bolus. Hold for SBP <90.  Time Spent Directly with Patient:  I have spent a total of 35 minutes with the patient reviewing hospital notes, telemetry, EKGs, labs and examining the patient as well as establishing an assessment and plan that was discussed personally with the patient. > 50% of time was spent in direct patient care.  Length of Stay:  LOS: 2 days   Pixie Casino, MD, [place]  Attending Cardiologist  Direct Dial: [phone]  Fax: [phone]  Website:  [email or web address] 08/13/2017, 2:00 PM

## 2017-08-13 NOTE — Progress Notes (Signed)
Pt bp 99/77, heart rate 85-127.  Pt states she feels better than earlier, but developed indigestion.  Pt states she gets indigestion frequently at home and takes tums for it.  Maalox given per PRN orders with relief.  Spoke with Cardiology and okay to proceed with metoprolol administration.  Will cont to monitor pt closely.

## 2017-08-14 DIAGNOSIS — K5900 Constipation, unspecified: Secondary | ICD-10-CM

## 2017-08-14 LAB — BASIC METABOLIC PANEL
ANION GAP: 9 (ref 5–15)
BUN: 22 mg/dL — ABNORMAL HIGH (ref 6–20)
CO2: 27 mmol/L (ref 22–32)
Calcium: 8.9 mg/dL (ref 8.9–10.3)
Chloride: 102 mmol/L (ref 101–111)
Creatinine, Ser: 0.91 mg/dL (ref 0.44–1.00)
GFR calc Af Amer: >60 mL/min (ref >60)
GFR, EST NON AFRICAN AMERICAN: 57 mL/min — AB (ref >60)
GLUCOSE: 104 mg/dL — AB (ref 65–99)
POTASSIUM: 3.8 mmol/L (ref 3.5–5.1)
Sodium: 138 mmol/L (ref 135–145)

## 2017-08-14 MED ORDER — METOPROLOL TARTRATE 25 MG PO TABS
25.0000 mg | ORAL_TABLET | Freq: Two times a day (BID) | ORAL | Status: DC
  Administered 2017-08-14 – 2017-08-17 (×6): 25 mg via ORAL
  Filled 2017-08-14 (×6): qty 1

## 2017-08-14 MED ORDER — POLYETHYLENE GLYCOL 3350 17 G PO PACK
17.0000 g | PACK | Freq: Every day | ORAL | Status: DC | PRN
  Administered 2017-08-15: 17 g via ORAL
  Filled 2017-08-14: qty 1

## 2017-08-14 NOTE — Progress Notes (Signed)
Patient states she has not had a bowel movement since Wednesday . Dr. Tana Coast paged about getting stool softeners ordered. Awaiting response.

## 2017-08-14 NOTE — Progress Notes (Signed)
Verbal and written order by Dr. Glena Norfolk that patient is allowed to travel by wheelchair around hospital accompanied by family. Oxygen was set up, telemetry remained and patient escorted to cardiac rehab lobby with RN, daughter and spouse. I remained with patient for 10 minutes and assessed O2 levels and cardiac rhythm. Patient stable and left in lobby with spouse and daughter.

## 2017-08-14 NOTE — Progress Notes (Signed)
DAILY PROGRESS NOTE   Patient Name: [name] Date of Encounter: 08/14/2017  Hospital Problem List   Principal Problem:   Acute diastolic CHF (congestive heart failure) ([place]) Active Problems:   Essential hypertension   PAF (paroxysmal atrial fibrillation) ([place])   Hypothyroidism   ILD (interstitial lung disease) ([place])    Chief Complaint   Constipated  Subjective   She is restless about being in bed. Wants to go home. Remains in A. fib with RVR. Blood pressure is improved today. I think there is room to increase her beta blocker. She is constipated and has not had a bowel movement since Wednesday. She is also not eating very much.  Objective   Vitals:   08/14/17 0032 08/14/17 0100 08/14/17 0553 08/14/17 0755  BP: 102/71 120/63 121/69 115/84  Pulse:    62  Resp:    16  Temp:  97.7 F (36.5 C) 97.7 F (36.5 C) 97.7 F (36.5 C)  TempSrc:  Axillary Axillary Axillary  SpO2: 97% 96% 96% 95%  Weight:   156 lb 9.6 oz (71 kg)   Height:        Intake/Output Summary (Last 24 hours) at 08/14/17 1325 Last data filed at 08/14/17 0900  Gross per 24 hour  Intake            950.5 ml  Output              525 ml  Net            425.5 ml   Filed Weights   08/12/17 0548 08/13/17 0452 08/14/17 0553  Weight: 153 lb 11.2 oz (69.7 kg) 152 lb (68.9 kg) 156 lb 9.6 oz (71 kg)    Physical Exam   General appearance: alert and no distress Neck: no carotid bruit, no JVD and thyroid not enlarged, symmetric, no tenderness/mass/nodules Lungs: rales bibasilar Heart: irregularly irregular rhythm and Tachycardic Abdomen: soft, non-tender; bowel sounds normal; no masses,  no organomegaly Extremities: extremities normal, atraumatic, no cyanosis or edema Pulses: 2+ and symmetric Skin: Skin color, texture, turgor normal. No rashes or lesions Neurologic: Grossly normal Psych: Irritated  Inpatient Medications    Scheduled Meds: . apixaban  5 mg Oral BID  . chlorhexidine  15 mL Mouth  Rinse BID  . levothyroxine  75 mcg Oral QAC breakfast  . mouth rinse  15 mL Mouth Rinse q12n4p  . metoprolol tartrate  12.5 mg Oral BID  . oxybutynin  5 mg Oral BID  . pantoprazole  40 mg Oral Daily  . potassium chloride  40 mEq Oral Daily  . sodium chloride flush  3 mL Intravenous Q12H  . vitamin B-12  100 mcg Oral Daily    Continuous Infusions: . sodium chloride 250 mL (08/12/17 0523)    PRN Meds: sodium chloride, acetaminophen, albuterol, alum & mag hydroxide-simeth, ondansetron (ZOFRAN) IV, polyvinyl alcohol, sodium chloride flush   Labs   Results for orders placed or performed during the hospital encounter of 08/11/17 (from the past 48 hour(s))  Potassium     Status: None   Collection Time: 08/12/17  5:53 PM  Result Value Ref Range   Potassium 3.7 3.5 - 5.1 mmol/L    Comment: DELTA CHECK NOTED  Basic metabolic panel     Status: Abnormal   Collection Time: 08/13/17  3:08 AM  Result Value Ref Range   Sodium 137 135 - 145 mmol/L   Potassium 3.3 (L) 3.5 - 5.1 mmol/L   Chloride 100 (L)  101 - 111 mmol/L   CO2 27 22 - 32 mmol/L   Glucose, Bld 118 (H) 65 - 99 mg/dL   BUN 15 6 - 20 mg/dL   Creatinine, Ser 0.86 0.44 - 1.00 mg/dL   Calcium 8.8 (L) 8.9 - 10.3 mg/dL   GFR calc non Af Amer >60 >60 mL/min   GFR calc Af Amer >60 >60 mL/min    Comment: (NOTE) The eGFR has been calculated using the CKD EPI equation. This calculation has not been validated in all clinical situations. eGFR's persistently <60 mL/min signify possible Chronic Kidney Disease.    Anion gap 10 5 - 15  Basic metabolic panel     Status: Abnormal   Collection Time: 08/14/17  2:43 AM  Result Value Ref Range   Sodium 138 135 - 145 mmol/L   Potassium 3.8 3.5 - 5.1 mmol/L   Chloride 102 101 - 111 mmol/L   CO2 27 22 - 32 mmol/L   Glucose, Bld 104 (H) 65 - 99 mg/dL   BUN 22 (H) 6 - 20 mg/dL   Creatinine, Ser 0.91 0.44 - 1.00 mg/dL   Calcium 8.9 8.9 - 10.3 mg/dL   GFR calc non Af Amer 57 (L) >60 mL/min    GFR calc Af Amer >60 >60 mL/min    Comment: (NOTE) The eGFR has been calculated using the CKD EPI equation. This calculation has not been validated in all clinical situations. eGFR's persistently <60 mL/min signify possible Chronic Kidney Disease.    Anion gap 9 5 - 15    ECG   N/A  Telemetry   A. fib with RVR - Personally Reviewed  Radiology    No results found.  Cardiac Studies   Deferred  Assessment   1. Principal Problem: 2.   Acute diastolic CHF (congestive heart failure) ([place]) 3. Active Problems: 4.   Essential hypertension 5.   PAF (paroxysmal atrial fibrillation) ([place]) 6.   Hypothyroidism 7.   ILD (interstitial lung disease) ([place]) 8.   Plan   1. Remains in A. fib with RVR. Plan is for AV node ablation and pacemaker placement on Wednesday. I expect she will be hospitalized until then. She is having some constipation will benefit from some MiraLAX which I ordered. I'm okay with her traveling around the hospital as long she is on telemetry for changes seen repeat.  Time Spent Directly with Patient:  I have spent a total of 15 minutes with the patient reviewing hospital notes, telemetry, EKGs, labs and examining the patient as well as establishing an assessment and plan that was discussed personally with the patient. > 50% of time was spent in direct patient care.  Length of Stay:  LOS: 3 days   Pixie Casino, MD, [place]  Attending Cardiologist  Direct Dial: [phone]  Fax: [phone]  Website:  [email or web address]  Nadean Corwin Qasim Diveley 08/14/2017, 1:25 PM

## 2017-08-14 NOTE — Progress Notes (Signed)
Patient did not tolerate going outside well. She was diaphoretic, stated she felt like passing out and became irritable. Patient was transferred from the wheelchair to the bed using the steady. I encouraged her to sit in the chair or stay on the unit when being wheeled around.

## 2017-08-14 NOTE — Progress Notes (Signed)
[facility]                                                                              Patient Demographics  [name], is a 83 y.o. female, DOB - 12-26-1932, JJK:[id number]  Admit date - 08/11/2017   Admitting Physician Elwin Mocha, MD  Outpatient Primary MD for the patient is Lajean Manes, MD  Outpatient specialists:   LOS - 3  days   Medical records reviewed and are as summarized below:    Chief Complaint  Patient presents with  . Shortness of Breath  . Atrial Fibrillation       Brief summary   Per admit note by Dr. Nanetta Batty is a 83 y.o. female  with past medical history significant for breast cancer, high blood pressure, migraines, proximal A. fib who had cardioversion by cardiology on 8/29 as an outpatient. Patient states that for the week leading up to the procedure she had been short of breath with low reserve. She would become extremely winded going up 6 stairs. Denied any fever, chills, cough. Patient was told postprocedure everything went fine and to call if any difficulties. Acutely around 3 AM patient developed shortness of breath and labored breathing. Patient was noted to be in atrial fib with RVR in the 150s per EKG by EMS. . In ED, patient had a short rounds of A. fib with RVR. Then would return to sinus rhythm. X-ray showed acute CHF. Patient given Lasix and placed on BiPAP due to increased work of breathing.   Assessment & Plan    Principal Problem:   Acute diastolic CHF (congestive heart failure) ([place])  - likely precipitated due to AFib with RVR - feeling better, still not at baseline, patient was placed on IV Lasix for diuresis, currently off  - Negative balance of 2.1 L - 2-D echo 8/30 showed EF of 55-60% with grade 2 diastolic dysfunction - Cardiology following,    Active Problems:    Essential hypertension - Now hypotensive, hold Lasix, beta blocker, ACE inhibitor    PAF (paroxysmal atrial fibrillation)  (HCC)with RVR, hypotension - patient underwent cardioversion outpatient on 8/29, likely unsuccessful - Cardiology following,had short runs of atrial fib with RVR in the ED, recommended EP consult - [place] VASC score 4, on eliquis  -cont BB for rate control, plan on ablation on Wednesday 9/5.       Hypothyroidism - continue Synthroid    ILD (interstitial lung disease) (HCC) - Currently stable, no wheezing, continue nebs as needed  Hypokalemia - resolved  Code Status:  Full  DVT Prophylaxis:  eliquis  Family Communication: Discussed in detail with the patient, all imaging results, lab results explained to the patient    Disposition Plan: d/w Dr Debara Pickett, medical issues stable, cardiology will assume care. I will sign off    Time Spent in minutes  15 minutes  Procedures:  2-D echo: eF 55-60% with grade 2 diastolic dysfunction  Consultants:   Cardiology  Antimicrobials:      Medications  Scheduled Meds: . apixaban  5 mg Oral BID  . chlorhexidine  15 mL Mouth Rinse BID  .  levothyroxine  75 mcg Oral QAC breakfast  . mouth rinse  15 mL Mouth Rinse q12n4p  . metoprolol tartrate  25 mg Oral BID  . oxybutynin  5 mg Oral BID  . pantoprazole  40 mg Oral Daily  . potassium chloride  40 mEq Oral Daily  . sodium chloride flush  3 mL Intravenous Q12H  . vitamin B-12  100 mcg Oral Daily   Continuous Infusions: . sodium chloride 250 mL (08/12/17 0523)   PRN Meds:.sodium chloride, acetaminophen, albuterol, alum & mag hydroxide-simeth, ondansetron (ZOFRAN) IV, polyethylene glycol, polyvinyl alcohol, sodium chloride flush   Antibiotics   Anti-infectives    None        Subjective:   [name] was seen and examined today. No complaints, no CP, SOB. Patient denies dizziness,  abdominal pain, N/V/D/C, new weakness, numbess, tingling.   Objective:   Vitals:   08/14/17 0032 08/14/17 0100 08/14/17 0553 08/14/17 0755  BP: 102/71 120/63 121/69 115/84  Pulse:    62  Resp:     16  Temp:  97.7 F (36.5 C) 97.7 F (36.5 C) 97.7 F (36.5 C)  TempSrc:  Axillary Axillary Axillary  SpO2: 97% 96% 96% 95%  Weight:   71 kg (156 lb 9.6 oz)   Height:        Intake/Output Summary (Last 24 hours) at 08/14/17 1452 Last data filed at 08/14/17 0900  Gross per 24 hour  Intake            950.5 ml  Output              525 ml  Net            425.5 ml     Wt Readings from Last 3 Encounters:  08/14/17 71 kg (156 lb 9.6 oz)  08/10/17 73.5 kg (162 lb)  08/09/17 73.8 kg (162 lb 12.8 oz)     Exam   General: Alert and oriented x 3, NAD Eyes: HEENT:   Cardiovascular: S1 S2 clear, ireg ireg,  No pedal edema b/l Respiratory: Clear to auscultation bilaterally, no wheezing, rales or rhonchi Gastrointestinal: Soft, nontender, nondistended, + bowel sounds Ext: no pedal edema bilaterally Neuro: no neuro deficits  Musculoskeletal: No digital cyanosis, clubbing Skin: No rashes Psych: Normal affect and demeanor, alert and oriented x3      Data Reviewed:  I have personally reviewed following labs and imaging studies  Micro Results Recent Results (from the past 240 hour(s))  MRSA PCR Screening     Status: None   Collection Time: 08/11/17  7:01 PM  Result Value Ref Range Status   MRSA by PCR NEGATIVE NEGATIVE Final    Comment:        The GeneXpert MRSA Assay (FDA approved for NASAL specimens only), is one component of a comprehensive MRSA colonization surveillance program. It is not intended to diagnose MRSA infection nor to guide or monitor treatment for MRSA infections.     Radiology Reports Dg Chest 2 View  Result Date: 08/12/2017 CLINICAL DATA:  Shortness of breath.  CHF EXAM: CHEST  2 VIEW COMPARISON:  Yesterday FINDINGS: Cardiomegaly. Stable aortic contours. Low volume chest with biapical pleural based scar like opacity. Small pleural effusions. No pulmonary edema seen today. IMPRESSION: Resolved pulmonary edema.  Small bilateral pleural effusion.  Electronically Signed   By: Monte Fantasia M.D.   On: 08/12/2017 07:40   Dg Chest Port 1 View  Result Date: 08/11/2017 CLINICAL DATA:  Shortness of breath. EXAM:  PORTABLE CHEST 1 VIEW COMPARISON:  CT 02/17/2017 . FINDINGS: Mediastinum hilar structures normal. Cardiomegaly with mild bilateral from interstitial prominence and small bilateral pleural effusions. Findings suggest CHF. No pneumothorax. IMPRESSION: Congestive heart failure with mild bilateral pulmonary interstitial edema and small bilateral pleural effusions. Electronically Signed   By: Marcello Moores  Register   On: 08/11/2017 06:27    Lab Data:  CBC:  Recent Labs Lab 08/09/17 0915 08/11/17 0602  WBC 4.9 5.2  NEUTROABS  --  3.6  HGB 12.0 11.6*  HCT 37.5 36.5  MCV 91.2 91.3  PLT 194 948   Basic Metabolic Panel:  Recent Labs Lab 08/09/17 0915 08/11/17 0602 08/12/17 0217 08/12/17 1753 08/13/17 0308 08/14/17 0243  NA 141 142 140  --  137 138  K 4.0 3.9 2.9* 3.7 3.3* 3.8  CL 108 109 103  --  100* 102  CO2 26 26 29   --  27 27  GLUCOSE 109* 111* 101*  --  118* 104*  BUN 12 18 13   --  15 22*  CREATININE 0.82 0.82 0.81  --  0.86 0.91  CALCIUM 9.1 8.9 8.9  --  8.8* 8.9  MG  --  1.9  --   --   --   --   PHOS  --  3.4  --   --   --   --    GFR: Estimated Creatinine Clearance: 46.4 mL/min (by C-G formula based on SCr of 0.91 mg/dL). Liver Function Tests: No results for input(s): AST, ALT, ALKPHOS, BILITOT, PROT, ALBUMIN in the last 168 hours. No results for input(s): LIPASE, AMYLASE in the last 168 hours. No results for input(s): AMMONIA in the last 168 hours. Coagulation Profile: No results for input(s): INR, PROTIME in the last 168 hours. Cardiac Enzymes:  Recent Labs Lab 08/11/17 0602 08/11/17 1601 08/11/17 2054  TROPONINI <0.03 0.03* <0.03   BNP (last 3 results) No results for input(s): PROBNP in the last 8760 hours. HbA1C: No results for input(s): HGBA1C in the last 72 hours. CBG: No results for input(s):  GLUCAP in the last 168 hours. Lipid Profile: No results for input(s): CHOL, HDL, LDLCALC, TRIG, CHOLHDL, LDLDIRECT in the last 72 hours. Thyroid Function Tests: No results for input(s): TSH, T4TOTAL, FREET4, T3FREE, THYROIDAB in the last 72 hours. Anemia Panel: No results for input(s): VITAMINB12, FOLATE, FERRITIN, TIBC, IRON, RETICCTPCT in the last 72 hours. Urine analysis:    Component Value Date/Time   COLORURINE YELLOW 03/28/2010 2217   APPEARANCEUR CLOUDY (A) 03/28/2010 2217   LABSPEC 1.022 03/28/2010 2217   PHURINE 5.0 03/28/2010 2217   GLUCOSEU NEGATIVE 03/28/2010 2217   HGBUR LARGE (A) 03/28/2010 2217   [place] NEGATIVE 03/28/2010 2217   [place] NEGATIVE 03/28/2010 2217   PROTEINUR NEGATIVE 03/28/2010 2217   UROBILINOGEN 0.2 03/28/2010 2217   NITRITE NEGATIVE 03/28/2010 2217   LEUKOCYTESUR LARGE (A) 03/28/2010 2217     Deidrea Gaetz M.D. [facility] 08/14/2017, 2:52 PM  Pager: [phone] Between 7am to 7pm - call Pager - [phone]  After 7pm go to [email or web address] - password [email or web address]  Call night coverage person covering after 7pm

## 2017-08-15 LAB — BASIC METABOLIC PANEL
Anion gap: 7 (ref 5–15)
BUN: 22 mg/dL — ABNORMAL HIGH (ref 6–20)
CALCIUM: 8.9 mg/dL (ref 8.9–10.3)
CO2: 26 mmol/L (ref 22–32)
Chloride: 104 mmol/L (ref 101–111)
Creatinine, Ser: 0.86 mg/dL (ref 0.44–1.00)
GFR calc Af Amer: >60 mL/min (ref >60)
GLUCOSE: 113 mg/dL — AB (ref 65–99)
Potassium: 4.5 mmol/L (ref 3.5–5.1)
Sodium: 137 mmol/L (ref 135–145)

## 2017-08-15 LAB — GLUCOSE, CAPILLARY: Glucose-Capillary: 99 mg/dL (ref 65–99)

## 2017-08-15 NOTE — Progress Notes (Signed)
 DAILY PROGRESS NOTE   Patient Name: [name] Date of Encounter: 08/15/2017  Hospital Problem List   Principal Problem:   Acute diastolic CHF (congestive heart failure) (HCC) Active Problems:   Essential hypertension   PAF (paroxysmal atrial fibrillation) (HCC)   Hypothyroidism   ILD (interstitial lung disease) (HCC)      Patient Profile:   [name] is a 83 y.o. female with a hx of HTN, MVP,  chronic venous insufficiency, breast cancer (L) treated/remission, AVNRT ablated remotely, PAFlutter s/p EPS Feb 2018 was found to have left atrial (likely mitral annular reentry) flutter with a RVR and underwent DCCV. Ablation was not attempted.She also had inducible AVNRT at a slower rate and evidence of AV conduction system disease,  No prior AARx that I can find   Recent8/18 cardioversion.  Admitted with tachypalps and SOB EMS noted AFib RVR with spontaneous reversion to sinus      Subjective  Feels weak and wants to be able to get up BM yday  Objective   Vitals:   08/14/17 1952 08/14/17 2100 08/14/17 2324 08/15/17 0533  BP: 111/81 132/88 113/73 110/71  Pulse: (!) 107 (!) 56 (!) 33 (!) 102  Resp: 20 15 14 16  Temp: 97.9 F (36.6 C)   97.6 F (36.4 C)  TempSrc:      SpO2: 94% 96% 95% 97%  Weight:    160 lb 4.8 oz (72.7 kg)  Height:        Intake/Output Summary (Last 24 hours) at 08/15/17 0611 Last data filed at 08/15/17 0533  Gross per 24 hour  Intake              120 ml  Output                0 ml  Net              120 ml   Filed Weights   08/13/17 0452 08/14/17 0553 08/15/17 0533  Weight: 152 lb (68.9 kg) 156 lb 9.6 oz (71 kg) 160 lb 4.8 oz (72.7 kg)    Physical Exam  Well developed and nourished in no acute distress HENT normal Neck supple   Clear Irregularly irregular rate and rhythm withrapid  ventricular response, no murmurs or gallops Abd-soft with active BS without hepatomegaly No Clubbing cyanosis edema Skin-warm and dry A & Oriented   Grossly normal sensory and motor function   Inpatient Medications    Scheduled Meds: . apixaban  5 mg Oral BID  . chlorhexidine  15 mL Mouth Rinse BID  . levothyroxine  75 mcg Oral QAC breakfast  . mouth rinse  15 mL Mouth Rinse q12n4p  . metoprolol tartrate  25 mg Oral BID  . oxybutynin  5 mg Oral BID  . pantoprazole  40 mg Oral Daily  . potassium chloride  40 mEq Oral Daily  . sodium chloride flush  3 mL Intravenous Q12H  . vitamin B-12  100 mcg Oral Daily    Continuous Infusions: . sodium chloride 250 mL (08/12/17 0523)    PRN Meds: sodium chloride, acetaminophen, albuterol, alum & mag hydroxide-simeth, ondansetron (ZOFRAN) IV, polyethylene glycol, polyvinyl alcohol, sodium chloride flush   Labs   Results for orders placed or performed during the hospital encounter of 08/11/17 (from the past 48 hour(s))  Basic metabolic panel     Status: Abnormal   Collection Time: 08/14/17  2:43 AM  Result Value Ref Range   Sodium 138 135 -   145 mmol/L   Potassium 3.8 3.5 - 5.1 mmol/L   Chloride 102 101 - 111 mmol/L   CO2 27 22 - 32 mmol/L   Glucose, Bld 104 (H) 65 - 99 mg/dL   BUN 22 (H) 6 - 20 mg/dL   Creatinine, Ser 0.91 0.44 - 1.00 mg/dL   Calcium 8.9 8.9 - 10.3 mg/dL   GFR calc non Af Amer 57 (L) >60 mL/min   GFR calc Af Amer >60 >60 mL/min    Comment: (NOTE) The eGFR has been calculated using the CKD EPI equation. This calculation has not been validated in all clinical situations. eGFR's persistently <60 mL/min signify possible Chronic Kidney Disease.    Anion gap 9 5 - 15  Basic metabolic panel     Status: Abnormal   Collection Time: 08/15/17  1:54 AM  Result Value Ref Range   Sodium 137 135 - 145 mmol/L   Potassium 4.5 3.5 - 5.1 mmol/L   Chloride 104 101 - 111 mmol/L   CO2 26 22 - 32 mmol/L   Glucose, Bld 113 (H) 65 - 99 mg/dL   BUN 22 (H) 6 - 20 mg/dL   Creatinine, Ser 0.86 0.44 - 1.00 mg/dL   Calcium 8.9 8.9 - 10.3 mg/dL   GFR calc non Af Amer >60 >60 mL/min    GFR calc Af Amer >60 >60 mL/min    Comment: (NOTE) The eGFR has been calculated using the CKD EPI equation. This calculation has not been validated in all clinical situations. eGFR's persistently <60 mL/min signify possible Chronic Kidney Disease.    Anion gap 7 5 - 15    ECG   N/A  Telemetry   A. fib with RVR - Personally Reviewed  Radiology    No results found.  Cardiac Studies   Deferred  Assessment   Afib persistent RVR  Acute/chronic diastolic heart failure  ILD  Hypertension   Plan   AV ablation and pacing on Wed--here until then 2/2 poorly controlled rate Increase BB Low dose diuretic  oob chair        08/15/2017, 6:11 AM   

## 2017-08-16 LAB — BASIC METABOLIC PANEL
ANION GAP: 5 (ref 5–15)
BUN: 20 mg/dL (ref 6–20)
CHLORIDE: 105 mmol/L (ref 101–111)
CO2: 27 mmol/L (ref 22–32)
Calcium: 8.8 mg/dL — ABNORMAL LOW (ref 8.9–10.3)
Creatinine, Ser: 0.9 mg/dL (ref 0.44–1.00)
GFR calc Af Amer: >60 mL/min (ref >60)
GFR calc non Af Amer: 58 mL/min — ABNORMAL LOW (ref >60)
Glucose, Bld: 105 mg/dL — ABNORMAL HIGH (ref 65–99)
POTASSIUM: 4.8 mmol/L (ref 3.5–5.1)
Sodium: 137 mmol/L (ref 135–145)

## 2017-08-16 NOTE — Care Management Important Message (Signed)
Important Message  Patient Details  Name: [name] MRN: [id number] Date of Birth: [birth year]   Medicare Important Message Given:  Yes    [name] Abena 08/16/2017, 11:04 AM

## 2017-08-16 NOTE — Progress Notes (Addendum)
Progress Note  Patient Name: [name] Date of Encounter: 08/16/2017  Primary Cardiologist: Dr. Radford Pax, Dr. Lovena Le  Subjective   Pt denies chest pain and shortness of breath. She has not been dizzy or orthostatic today. She feels palpitations intermittently.  Inpatient Medications    Scheduled Meds: . apixaban  5 mg Oral BID  . chlorhexidine  15 mL Mouth Rinse BID  . levothyroxine  75 mcg Oral QAC breakfast  . mouth rinse  15 mL Mouth Rinse q12n4p  . metoprolol tartrate  25 mg Oral BID  . oxybutynin  5 mg Oral BID  . pantoprazole  40 mg Oral Daily  . potassium chloride  40 mEq Oral Daily  . sodium chloride flush  3 mL Intravenous Q12H  . vitamin B-12  100 mcg Oral Daily   Continuous Infusions: . sodium chloride 250 mL (08/12/17 0523)   PRN Meds: sodium chloride, acetaminophen, albuterol, alum & mag hydroxide-simeth, ondansetron (ZOFRAN) IV, polyethylene glycol, polyvinyl alcohol, sodium chloride flush   Vital Signs    Vitals:   08/15/17 2150 08/16/17 0010 08/16/17 0349 08/16/17 0540  BP: 116/79 107/70  (!) 112/97  Pulse: 73 (!) 103  100  Resp: 15 18  18   Temp: 98.3 F (36.8 C) 98 F (36.7 C)  98.1 F (36.7 C)  TempSrc: Oral Oral  Oral  SpO2: 99% 100%  95%  Weight:   160 lb 7.9 oz (72.8 kg) 150 lb 12.8 oz (68.4 kg)  Height:        Intake/Output Summary (Last 24 hours) at 08/16/17 0938 Last data filed at 08/15/17 1800  Gross per 24 hour  Intake              480 ml  Output              200 ml  Net              280 ml   Filed Weights   08/15/17 0533 08/16/17 0349 08/16/17 0540  Weight: 160 lb 4.8 oz (72.7 kg) 160 lb 7.9 oz (72.8 kg) 150 lb 12.8 oz (68.4 kg)     Physical Exam   General: Well developed, well nourished, female appearing in no acute distress. Head: Normocephalic, atraumatic.  Neck: Supple without bruits, no JVD Lungs:  Resp regular and unlabored, CTA. Heart: Irregular rhythm, irregular rate, no murmur; no rub. Abdomen: Soft,  non-tender, non-distended with normoactive bowel sounds. No hepatomegaly. No rebound/guarding. No obvious abdominal masses. Extremities: No clubbing, cyanosis, trace edema. Distal pedal pulses are 1+ bilaterally. Neuro: Alert and oriented X 3. Moves all extremities spontaneously. Psych: Normal affect.  Labs    Chemistry Recent Labs Lab 08/14/17 0243 08/15/17 0154 08/16/17 0259  NA 138 137 137  K 3.8 4.5 4.8  CL 102 104 105  CO2 27 26 27   GLUCOSE 104* 113* 105*  BUN 22* 22* 20  CREATININE 0.91 0.86 0.90  CALCIUM 8.9 8.9 8.8*  GFRNONAA 57* >60 58*  GFRAA >60 >60 >60  ANIONGAP 9 7 5      Hematology Recent Labs Lab 08/11/17 0602  WBC 5.2  RBC 4.00  HGB 11.6*  HCT 36.5  MCV 91.3  MCH 29.0  MCHC 31.8  RDW 14.5  PLT 197    Cardiac Enzymes Recent Labs Lab 08/11/17 0602 08/11/17 1601 08/11/17 2054  TROPONINI <0.03 0.03* <0.03    Recent Labs Lab 08/11/17 0610  TROPIPOC 0.01     BNP Recent Labs Lab 08/11/17 0602  BNP 574.4*     DDimer  Recent Labs Lab 08/11/17 0602  DDIMER 0.75*     Radiology    No results found.   Telemetry    Afib/Flutter with bouts of sinus with 1st degree heart block - Personally Reviewed  ECG    No new tracings - Personally Reviewed   Cardiac Studies   Echocardiogram 08/11/17: Study Conclusions - Left ventricle: The cavity size was normal. Systolic function was   normal. The estimated ejection fraction was in the range of 55%   to 60%. Wall motion was normal; there were no regional wall   motion abnormalities. Features are consistent with a pseudonormal   left ventricular filling pattern, with concomitant abnormal   relaxation and increased filling pressure (grade 2 diastolic   dysfunction). Doppler parameters are consistent with high   ventricular filling pressure. - Aortic valve: There was no regurgitation. - Mitral valve: Transvalvular velocity was within the normal range.   There was no evidence for  stenosis. There was trivial   regurgitation. - Left atrium: The atrium was severely dilated. - Right ventricle: The cavity size was normal. Wall thickness was   normal. Systolic function was normal. - Tricuspid valve: There was no regurgitation. - Pericardium, extracardiac: There was a left pleural effusion.  Impressions: - Compared with the echo 2/67/12, the systolic function has   improved.  Patient Profile     83 y.o. female with recurrent symptomatic atypical (left-sided) atrial flutter/atrial fibrillation, complicated by acute diastolic heart failure, previously complicated by tachycardia related cardiomyopathy (left ventricular systolic function now returned to normal). Mildly elevated TSH without overt hypothyroidism, on chronic levothyroxine supplement.  Assessment & Plan    1. Atrial fibrillation with RVR  - DCCV 08/10/17 - CHADSVasc 4 or higher (age 46, gender, CHF/LV dysfunction), on Eliquis - has not been held - continue lopressor - telemetry with Afib/flutter with bouts of sinus with 1st degree heart block - EP consulted and plan for ablation with PPM insertion 08/17/17   2. Hx of non-ischemic cardiomyopathy, grade 2 DD - recent echo with improved/normal LVEF, grade 2 DD - continue lopressor - overall net negative 1.4L, strict I&Os not charted - appears euvolemic on exam   3. Interstitial lung disease - not an amiodarone candidate   Signed, Ledora Bottcher , PA-C 9:38 AM 08/16/2017 Pager: [phone]  Personally seen and examined. Agree with above. Has baseline SOB, no CP Irreg irreg, tachy, CTAB with fine rales  AFIB  - AV nodal ablation with pacer tomorrow   Chronic diastolic HF  - euvolemic, EF 60%  - no change in meds  ILD  - avoiding amio  Candee Furbish, MD

## 2017-08-17 ENCOUNTER — Encounter (HOSPITAL_COMMUNITY): Payer: Self-pay | Admitting: Internal Medicine

## 2017-08-17 ENCOUNTER — Encounter (HOSPITAL_COMMUNITY)
Admission: EM | Disposition: A | Discharge status: Home / Self Care | Payer: Self-pay | Source: Home / Self Care | Attending: Internal Medicine

## 2017-08-17 ENCOUNTER — Ambulatory Visit (HOSPITAL_COMMUNITY): Admission: RE | Admit: 2017-08-17 | Payer: Medicare Other | Source: Ambulatory Visit | Admitting: Internal Medicine

## 2017-08-17 DIAGNOSIS — Z950 Presence of cardiac pacemaker: Secondary | ICD-10-CM

## 2017-08-17 DIAGNOSIS — I442 Atrioventricular block, complete: Secondary | ICD-10-CM

## 2017-08-17 DIAGNOSIS — I4891 Unspecified atrial fibrillation: Secondary | ICD-10-CM

## 2017-08-17 HISTORY — PX: AV NODE ABLATION: EP1193

## 2017-08-17 HISTORY — PX: PACEMAKER IMPLANT: EP1218

## 2017-08-17 SURGERY — PACEMAKER IMPLANT

## 2017-08-17 MED ORDER — ACETAMINOPHEN 325 MG PO TABS
325.0000 mg | ORAL_TABLET | ORAL | Status: DC | PRN
  Administered 2017-08-17 – 2017-08-18 (×2): 650 mg via ORAL
  Filled 2017-08-17 (×2): qty 2

## 2017-08-17 MED ORDER — MIDAZOLAM HCL 5 MG/5ML IJ SOLN
INTRAMUSCULAR | Status: DC | PRN
  Administered 2017-08-17: 1 mg via INTRAVENOUS
  Administered 2017-08-17: 2 mg via INTRAVENOUS
  Administered 2017-08-17 (×2): 1 mg via INTRAVENOUS

## 2017-08-17 MED ORDER — CHLORHEXIDINE GLUCONATE 4 % EX LIQD
60.0000 mL | Freq: Once | CUTANEOUS | Status: AC
  Administered 2017-08-17: 4 via TOPICAL
  Filled 2017-08-17: qty 15

## 2017-08-17 MED ORDER — SODIUM CHLORIDE 0.9 % IV SOLN
INTRAVENOUS | Status: DC
  Administered 2017-08-17: 200 mL via INTRAVENOUS
  Administered 2017-08-17: 08:00:00 via INTRAVENOUS

## 2017-08-17 MED ORDER — HEPARIN (PORCINE) IN NACL 2-0.9 UNIT/ML-% IJ SOLN
INTRAMUSCULAR | Status: AC
  Filled 2017-08-17: qty 500

## 2017-08-17 MED ORDER — IOPAMIDOL (ISOVUE-370) INJECTION 76%
INTRAVENOUS | Status: DC | PRN
  Administered 2017-08-17: 10 mL via INTRAVENOUS

## 2017-08-17 MED ORDER — HEPARIN (PORCINE) IN NACL 2-0.9 UNIT/ML-% IJ SOLN
INTRAMUSCULAR | Status: AC | PRN
  Administered 2017-08-17: 500 mL

## 2017-08-17 MED ORDER — CHLORHEXIDINE GLUCONATE 4 % EX LIQD
60.0000 mL | Freq: Once | CUTANEOUS | Status: AC
  Administered 2017-08-17: 4 via TOPICAL

## 2017-08-17 MED ORDER — APIXABAN 5 MG PO TABS: 5.0000 mg | ORAL_TABLET | Freq: Two times a day (BID) | ORAL | Status: DC

## 2017-08-17 MED ORDER — LIDOCAINE HCL (PF) 1 % IJ SOLN
INTRAMUSCULAR | Status: AC
  Filled 2017-08-17: qty 90

## 2017-08-17 MED ORDER — ONDANSETRON HCL 4 MG/2ML IJ SOLN: 4.0000 mg | Freq: Four times a day (QID) | INTRAMUSCULAR | Status: DC | PRN

## 2017-08-17 MED ORDER — BIOTENE DRY MOUTH MT LIQD: 15.0000 mL | OROMUCOSAL | Status: DC | PRN

## 2017-08-17 MED ORDER — SODIUM CHLORIDE 0.9 % IR SOLN
80.0000 mg | Status: AC
  Administered 2017-08-17: 80 mg

## 2017-08-17 MED ORDER — CEFAZOLIN SODIUM-DEXTROSE 2-4 GM/100ML-% IV SOLN
INTRAVENOUS | Status: AC
  Filled 2017-08-17: qty 100

## 2017-08-17 MED ORDER — MIDAZOLAM HCL 5 MG/5ML IJ SOLN
INTRAMUSCULAR | Status: AC
  Filled 2017-08-17: qty 5

## 2017-08-17 MED ORDER — CEFAZOLIN SODIUM-DEXTROSE 1-4 GM/50ML-% IV SOLN
1.0000 g | Freq: Four times a day (QID) | INTRAVENOUS | Status: AC
  Administered 2017-08-17 – 2017-08-18 (×3): 1 g via INTRAVENOUS
  Filled 2017-08-17 (×3): qty 50

## 2017-08-17 MED ORDER — FENTANYL CITRATE (PF) 100 MCG/2ML IJ SOLN
INTRAMUSCULAR | Status: DC | PRN
  Administered 2017-08-17: 12.5 ug via INTRAVENOUS
  Administered 2017-08-17: 25 ug via INTRAVENOUS

## 2017-08-17 MED ORDER — CEFAZOLIN SODIUM-DEXTROSE 2-4 GM/100ML-% IV SOLN
2.0000 g | INTRAVENOUS | Status: AC
Start: 2017-08-17 — End: 2017-08-17
  Administered 2017-08-17: 2 g via INTRAVENOUS

## 2017-08-17 MED ORDER — SODIUM CHLORIDE 0.9 % IR SOLN
Status: AC
  Filled 2017-08-17: qty 2

## 2017-08-17 MED ORDER — FENTANYL CITRATE (PF) 100 MCG/2ML IJ SOLN
INTRAMUSCULAR | Status: AC
  Filled 2017-08-17: qty 2

## 2017-08-17 MED ORDER — SODIUM CHLORIDE 0.9 % IV SOLN: INTRAVENOUS | Status: DC

## 2017-08-17 MED ORDER — LIDOCAINE HCL (PF) 1 % IJ SOLN
INTRAMUSCULAR | Status: DC | PRN
  Administered 2017-08-17: 10 mL
  Administered 2017-08-17: 60 mL

## 2017-08-17 SURGICAL SUPPLY — 18 items
BAG SNAP BAND KOVER 36X36 (MISCELLANEOUS) ×2 IMPLANT
CABLE SURGICAL S-101-97-12 (CABLE) ×2 IMPLANT
CATH BLAZER 7FR 4MM LG 5031TK2 (ABLATOR) ×2 IMPLANT
CATH JOSEPHSON QUAD-ALLRED 6FR (CATHETERS) ×2 IMPLANT
CATH RIGHTSITE C315HIS02 (CATHETERS) ×2 IMPLANT
GUIDEWIRE ANGLED .035X150CM (WIRE) ×2 IMPLANT
IPG PACE AZUR XT DR MRI W1DR01 (Pacemaker) ×1 IMPLANT
LEAD CAPSURE NOVUS 5076-52CM (Lead) ×2 IMPLANT
LEAD SELECT SECURE 3830 383069 (Lead) ×1 IMPLANT
PACE AZURE XT DR MRI W1DR01 (Pacemaker) ×2 IMPLANT
PACK EP LATEX FREE (CUSTOM PROCEDURE TRAY) ×1
PACK EP LF (CUSTOM PROCEDURE TRAY) ×1 IMPLANT
PAD DEFIB LIFELINK (PAD) ×2 IMPLANT
SELECT SECURE 3830 383069 (Lead) ×2 IMPLANT
SHEATH CLASSIC 7F (SHEATH) ×4 IMPLANT
SHEATH PINNACLE 8F 10CM (SHEATH) ×2 IMPLANT
SLITTER 6232ADJ (MISCELLANEOUS) ×2 IMPLANT
TRAY PACEMAKER INSERTION (PACKS) ×2 IMPLANT

## 2017-08-17 NOTE — Interval H&P Note (Signed)
History and Physical Interval Note:  08/17/2017 10:13 AM  [name]  has presented today for surgery, with the diagnosis of tachibradie  The various methods of treatment have been discussed with the patient and family. After consideration of risks, benefits and other options for treatment, the patient has consented to  Procedure(s): Pacemaker Implant (N/A) AV Node Ablation (N/A) as a surgical intervention .  The patient's history has been reviewed, patient examined, no change in status, stable for surgery.  I have reviewed the patient's chart and labs.  Questions were answered to the patient's satisfaction.     [name]

## 2017-08-17 NOTE — Plan of Care (Signed)
Problem: Physical Regulation: Goal: Will remain free from infection Outcome: Progressing VSS

## 2017-08-17 NOTE — Progress Notes (Signed)
Progress Note  Patient Name: [name] Date of Encounter: 08/17/2017  Primary Cardiologist: Dr. Radford Pax / Dr. Lovena Le  Subjective   Patient is feeling well; denies chest pain and SOB. Awaiting ablation and PPM insertion. Feels well this morning.   Inpatient Medications    Scheduled Meds: . apixaban  5 mg Oral BID  . chlorhexidine  60 mL Topical Once  . chlorhexidine  60 mL Topical Once  . chlorhexidine  15 mL Mouth Rinse BID  . gentamicin irrigation  80 mg Irrigation On Call  . levothyroxine  75 mcg Oral QAC breakfast  . mouth rinse  15 mL Mouth Rinse q12n4p  . metoprolol tartrate  25 mg Oral BID  . oxybutynin  5 mg Oral BID  . pantoprazole  40 mg Oral Daily  . potassium chloride  40 mEq Oral Daily  . sodium chloride flush  3 mL Intravenous Q12H  . vitamin B-12  100 mcg Oral Daily   Continuous Infusions: . sodium chloride 250 mL (08/12/17 0523)  . sodium chloride    . sodium chloride    .  ceFAZolin (ANCEF) IV     PRN Meds: sodium chloride, acetaminophen, albuterol, alum & mag hydroxide-simeth, ondansetron (ZOFRAN) IV, polyethylene glycol, polyvinyl alcohol, sodium chloride flush   Vital Signs    Vitals:   08/16/17 1948 08/16/17 2131 08/17/17 0100 08/17/17 0607  BP: 92/67 112/65 139/70 102/69  Pulse: 100 (!) 111 74 71  Resp: 19  18 20   Temp: 97.8 F (36.6 C)  98.4 F (36.9 C) 97.7 F (36.5 C)  TempSrc: Oral  Axillary Oral  SpO2: 97%  95% 98%  Weight:    152 lb 14.4 oz (69.4 kg)  Height:        Intake/Output Summary (Last 24 hours) at 08/17/17 0746 Last data filed at 08/16/17 1000  Gross per 24 hour  Intake                0 ml  Output              500 ml  Net             -500 ml   Filed Weights   08/16/17 0349 08/16/17 0540 08/17/17 0607  Weight: 160 lb 7.9 oz (72.8 kg) 150 lb 12.8 oz (68.4 kg) 152 lb 14.4 oz (69.4 kg)     Physical Exam   General: Well developed, well nourished, female appearing in no acute distress. Head: Normocephalic,  atraumatic.  Neck: Supple without bruits, no JVD Lungs:  Resp regular and unlabored, CTA. Heart: Irregular rhythm regular rate, no murmur; no rub. Abdomen: Soft, non-tender, non-distended with normoactive bowel sounds. No hepatomegaly. No rebound/guarding. No obvious abdominal masses. Extremities: No clubbing, cyanosis, no edema. Distal pedal pulses are 2+ bilaterally. Neuro: Alert and oriented X 3. Moves all extremities spontaneously. Psych: Normal affect.  Labs    Chemistry Recent Labs Lab 08/14/17 0243 08/15/17 0154 08/16/17 0259  NA 138 137 137  K 3.8 4.5 4.8  CL 102 104 105  CO2 27 26 27   GLUCOSE 104* 113* 105*  BUN 22* 22* 20  CREATININE 0.91 0.86 0.90  CALCIUM 8.9 8.9 8.8*  GFRNONAA 57* >60 58*  GFRAA >60 >60 >60  ANIONGAP 9 7 5      Hematology Recent Labs Lab 08/11/17 0602  WBC 5.2  RBC 4.00  HGB 11.6*  HCT 36.5  MCV 91.3  MCH 29.0  MCHC 31.8  RDW 14.5  PLT 197  Cardiac Enzymes Recent Labs Lab 08/11/17 0602 08/11/17 1601 08/11/17 2054  TROPONINI <0.03 0.03* <0.03    Recent Labs Lab 08/11/17 0610  TROPIPOC 0.01     BNP Recent Labs Lab 08/11/17 0602  BNP 574.4*     DDimer  Recent Labs Lab 08/11/17 0602  DDIMER 0.75*     Radiology    No results found.   Telemetry    Fib/flutter, generally rate-controlled - Personally Reviewed  ECG    No new tracings - Personally Reviewed   Cardiac Studies   Echocardiogram 08/11/17: Study Conclusions - Left ventricle: The cavity size was normal. Systolic function was normal. The estimated ejection fraction was in the range of 55% to 60%. Wall motion was normal; there were no regional wall motion abnormalities. Features are consistent with a pseudonormal left ventricular filling pattern, with concomitant abnormal relaxation and increased filling pressure (grade 2 diastolic dysfunction). Doppler parameters are consistent with high ventricular filling pressure. - Aortic  valve: There was no regurgitation. - Mitral valve: Transvalvular velocity was within the normal range. There was no evidence for stenosis. There was trivial regurgitation. - Left atrium: The atrium was severely dilated. - Right ventricle: The cavity size was normal. Wall thickness was normal. Systolic function was normal. - Tricuspid valve: There was no regurgitation. - Pericardium, extracardiac: There was a left pleural effusion.  Impressions: - Compared with the echo 09/01/15, the systolic function has improved.   Patient Profile     83 y.o. female with recurrent symptomatic atypical (left-sided) atrial flutter/atrial fibrillation, complicated by acute diastolic heart failure, previously complicated by tachycardia related cardiomyopathy (left ventricular systolic function now returned to normal). Mildly elevated TSH without overt hypothyroidism, on chronic levothyroxine supplement.  Assessment & Plan    1. Atrial fibrillation - DCCV 08/10/17 - This patients CHA2DS2-VASc Score and unadjusted Ischemic Stroke Rate (% per year) is equal to 4.8 % stroke rate/year from a score of 4 (age, gender, CHF) - continue lopressor this morning - telemetry with generally rate-controlled fib/flutter - awaiting ablation and PPM insertion today  2. Hx of nonischemic cardiomyopathy, grade 2 DD - recent echo with improved LVEF to 55-60% and grade 2 DD  - continue lopressor - she is euvolemic on exam  3. ILD - no amiodarone   Signed, Ledora Bottcher , PA-C 7:46 AM 08/17/2017 Pager: [phone]  Personally seen and examined. Agree with above.  Sleeping now post AVN ablation with His bundle pacing. Tele with nice narrow QRS. Eliquis  Candee Furbish, MD

## 2017-08-17 NOTE — Progress Notes (Signed)
Electrophysiology Rounding Note  Patient Name: [name] Date of Encounter: 08/17/2017  Primary Cardiologist: Radford Pax Electrophysiologist: Lovena Le   Subjective   The patient is doing well today.  At this time, the patient denies chest pain, shortness of breath, or any new concerns.  Inpatient Medications    Scheduled Meds: . [START ON 08/18/2017] apixaban  5 mg Oral BID  . chlorhexidine  60 mL Topical Once  . chlorhexidine  60 mL Topical Once  . chlorhexidine  15 mL Mouth Rinse BID  . gentamicin irrigation  80 mg Irrigation On Call  . levothyroxine  75 mcg Oral QAC breakfast  . mouth rinse  15 mL Mouth Rinse q12n4p  . metoprolol tartrate  25 mg Oral BID  . oxybutynin  5 mg Oral BID  . pantoprazole  40 mg Oral Daily  . potassium chloride  40 mEq Oral Daily  . sodium chloride flush  3 mL Intravenous Q12H  . vitamin B-12  100 mcg Oral Daily   Continuous Infusions: . sodium chloride 250 mL (08/12/17 0523)  . sodium chloride    . sodium chloride    .  ceFAZolin (ANCEF) IV     PRN Meds: sodium chloride, acetaminophen, albuterol, alum & mag hydroxide-simeth, ondansetron (ZOFRAN) IV, polyethylene glycol, polyvinyl alcohol, sodium chloride flush   Vital Signs    Vitals:   08/16/17 1948 08/16/17 2131 08/17/17 0100 08/17/17 0607  BP: 92/67 112/65 139/70 102/69  Pulse: 100 (!) 111 74 71  Resp: 19  18 20   Temp: 97.8 F (36.6 C)  98.4 F (36.9 C) 97.7 F (36.5 C)  TempSrc: Oral  Axillary Oral  SpO2: 97%  95% 98%  Weight:    152 lb 14.4 oz (69.4 kg)  Height:        Intake/Output Summary (Last 24 hours) at 08/17/17 0749 Last data filed at 08/16/17 1000  Gross per 24 hour  Intake                0 ml  Output              500 ml  Net             -500 ml   Filed Weights   08/16/17 0349 08/16/17 0540 08/17/17 0607  Weight: 160 lb 7.9 oz (72.8 kg) 150 lb 12.8 oz (68.4 kg) 152 lb 14.4 oz (69.4 kg)    Physical Exam    GEN- The patient is elderly appearing, alert and  oriented x 3 today.   Head- normocephalic, atraumatic Eyes-  Sclera clear, conjunctiva pink Ears- hearing intact Oropharynx- clear Neck- supple Lungs- Clear to ausculation bilaterally, normal work of breathing Heart- Tachycardic irregular rate and rhythm  GI- soft, NT, ND, + BS Extremities- no clubbing, cyanosis, or edema Skin- no rash or lesion Psych- euthymic mood, full affect Neuro- strength and sensation are intact  Labs    Basic Metabolic Panel  Recent Labs  08/15/17 0154 08/16/17 0259  NA 137 137  K 4.5 4.8  CL 104 105  CO2 26 27  GLUCOSE 113* 105*  BUN 22* 20  CREATININE 0.86 0.90  CALCIUM 8.9 8.8*     Telemetry    Atrial fibrillation with RVR, V rates 100-120's (personally reviewed)  Radiology    No results found.   Patient Profile     83 y.o. female with recurrent symptomatic atypical (left-sided) atrial flutter/atrial fibrillation, complicated by acute diastolic heart failure, previously complicated by tachycardia related cardiomyopathy (left  ventricular systolic function now returned to normal). Mildly elevated TSH without overt hypothyroidism, on chronic levothyroxine supplement.  Assessment & Plan    1.  Persistent atrial fibrillation with RVR Plan for PPM and AVN ablation this morning with His Bundle pacing Hold Eliquis today Risks, benefits reviewed again this morning and patient wishes to proceed  2.  Acute on chronic diastolic heart failure Stable No change required today   Signed, Chanetta Marshall, NP  08/17/2017, 7:49 AM   EP Attending  Patient seen and examined. Agree with above. The patient continues with rapid atrial fib and I have again reviewed the indications for AV node ablation and PPM insertion with a His bundle system and she wishes to proceed.  Mikle Bosworth.D.

## 2017-08-17 NOTE — Progress Notes (Signed)
Sheath pull finish will return to [id number].

## 2017-08-17 NOTE — H&P (View-Only) (Signed)
Electrophysiology Rounding Note  Patient Name: [name] Date of Encounter: 08/17/2017  Primary Cardiologist: Radford Pax Electrophysiologist: Lovena Le   Subjective   The patient is doing well today.  At this time, the patient denies chest pain, shortness of breath, or any new concerns.  Inpatient Medications    Scheduled Meds: . [START ON 08/18/2017] apixaban  5 mg Oral BID  . chlorhexidine  60 mL Topical Once  . chlorhexidine  60 mL Topical Once  . chlorhexidine  15 mL Mouth Rinse BID  . gentamicin irrigation  80 mg Irrigation On Call  . levothyroxine  75 mcg Oral QAC breakfast  . mouth rinse  15 mL Mouth Rinse q12n4p  . metoprolol tartrate  25 mg Oral BID  . oxybutynin  5 mg Oral BID  . pantoprazole  40 mg Oral Daily  . potassium chloride  40 mEq Oral Daily  . sodium chloride flush  3 mL Intravenous Q12H  . vitamin B-12  100 mcg Oral Daily   Continuous Infusions: . sodium chloride 250 mL (08/12/17 0523)  . sodium chloride    . sodium chloride    .  ceFAZolin (ANCEF) IV     PRN Meds: sodium chloride, acetaminophen, albuterol, alum & mag hydroxide-simeth, ondansetron (ZOFRAN) IV, polyethylene glycol, polyvinyl alcohol, sodium chloride flush   Vital Signs    Vitals:   08/16/17 1948 08/16/17 2131 08/17/17 0100 08/17/17 0607  BP: 92/67 112/65 139/70 102/69  Pulse: 100 (!) 111 74 71  Resp: 19  18 20   Temp: 97.8 F (36.6 C)  98.4 F (36.9 C) 97.7 F (36.5 C)  TempSrc: Oral  Axillary Oral  SpO2: 97%  95% 98%  Weight:    152 lb 14.4 oz (69.4 kg)  Height:        Intake/Output Summary (Last 24 hours) at 08/17/17 0749 Last data filed at 08/16/17 1000  Gross per 24 hour  Intake                0 ml  Output              500 ml  Net             -500 ml   Filed Weights   08/16/17 0349 08/16/17 0540 08/17/17 0607  Weight: 160 lb 7.9 oz (72.8 kg) 150 lb 12.8 oz (68.4 kg) 152 lb 14.4 oz (69.4 kg)    Physical Exam    GEN- The patient is elderly appearing, alert and  oriented x 3 today.   Head- normocephalic, atraumatic Eyes-  Sclera clear, conjunctiva pink Ears- hearing intact Oropharynx- clear Neck- supple Lungs- Clear to ausculation bilaterally, normal work of breathing Heart- Tachycardic irregular rate and rhythm  GI- soft, NT, ND, + BS Extremities- no clubbing, cyanosis, or edema Skin- no rash or lesion Psych- euthymic mood, full affect Neuro- strength and sensation are intact  Labs    Basic Metabolic Panel  Recent Labs  08/15/17 0154 08/16/17 0259  NA 137 137  K 4.5 4.8  CL 104 105  CO2 26 27  GLUCOSE 113* 105*  BUN 22* 20  CREATININE 0.86 0.90  CALCIUM 8.9 8.8*     Telemetry    Atrial fibrillation with RVR, V rates 100-120's (personally reviewed)  Radiology    No results found.   Patient Profile     83 y.o. female with recurrent symptomatic atypical (left-sided) atrial flutter/atrial fibrillation, complicated by acute diastolic heart failure, previously complicated by tachycardia related cardiomyopathy (left  ventricular systolic function now returned to normal). Mildly elevated TSH without overt hypothyroidism, on chronic levothyroxine supplement.  Assessment & Plan    1.  Persistent atrial fibrillation with RVR Plan for PPM and AVN ablation this morning with His Bundle pacing Hold Eliquis today Risks, benefits reviewed again this morning and patient wishes to proceed  2.  Acute on chronic diastolic heart failure Stable No change required today   Signed, Chanetta Marshall, NP  08/17/2017, 7:49 AM   EP Attending  Patient seen and examined. Agree with above. The patient continues with rapid atrial fib and I have again reviewed the indications for AV node ablation and PPM insertion with a His bundle system and she wishes to proceed.  Mikle Bosworth.D.

## 2017-08-17 NOTE — Progress Notes (Signed)
8 french sheath removed from right femoral vein and pressure held x 15 minutes.  Vitals stable throughout sheath removal.  Site dressed with 4x4 and tegaderm.  Instructions given for bedrest.  Site look good level 0 with no hematoma.  RN on floor will continue to monitor site.

## 2017-08-18 ENCOUNTER — Inpatient Hospital Stay (HOSPITAL_COMMUNITY): Payer: Medicare Other

## 2017-08-18 MED ORDER — LEVOTHYROXINE SODIUM 75 MCG PO TABS
75.0000 ug | ORAL_TABLET | Freq: Every day | ORAL | Status: DC
  Administered 2017-08-18: 75 ug via ORAL
  Filled 2017-08-18: qty 1

## 2017-08-18 MED ORDER — APIXABAN 5 MG PO TABS: 5.0000 mg | ORAL_TABLET | Freq: Two times a day (BID) | ORAL | 1 refills | Status: DC

## 2017-08-18 MED ORDER — LOSARTAN POTASSIUM 50 MG PO TABS
50.0000 mg | ORAL_TABLET | Freq: Every day | ORAL | Status: DC
  Administered 2017-08-18: 50 mg via ORAL
  Filled 2017-08-18: qty 1

## 2017-08-18 MED ORDER — LOSARTAN POTASSIUM 50 MG PO TABS: 50.0000 mg | ORAL_TABLET | Freq: Every day | ORAL | 3 refills | Status: DC

## 2017-08-18 MED ORDER — PANTOPRAZOLE SODIUM 40 MG PO TBEC
40.0000 mg | DELAYED_RELEASE_TABLET | Freq: Every day | ORAL | Status: DC
  Administered 2017-08-18: 40 mg via ORAL
  Filled 2017-08-18: qty 1

## 2017-08-18 NOTE — Progress Notes (Addendum)
Progress Note  Patient Name: [name] Date of Encounter: 08/18/2017  Primary Cardiologist: Dr. Radford Pax / Dr. Lovena Le  Subjective   Patient feels better today, no chest pain or palpitations. She needs to walk in the halls before discharge this afternoon.  Inpatient Medications    Scheduled Meds: . losartan  50 mg Oral Daily   Continuous Infusions:  PRN Meds: acetaminophen, antiseptic oral rinse, ondansetron (ZOFRAN) IV   Vital Signs    Vitals:   08/17/17 2357 08/18/17 0445 08/18/17 0632 08/18/17 0745  BP: 102/68 (!) 136/95 (!) 136/93 (!) 144/107  Pulse: 89 88 88 92  Resp: 16 17 20 19   Temp: (!) 97.4 F (36.3 C) (!) 97.5 F (36.4 C)  98.6 F (37 C)  TempSrc: Axillary Axillary  Oral  SpO2: 95% 96%  99%  Weight:  154 lb 1.6 oz (69.9 kg)    Height:        Intake/Output Summary (Last 24 hours) at 08/18/17 0831 Last data filed at 08/18/17 0431  Gross per 24 hour  Intake              220 ml  Output                0 ml  Net              220 ml   Filed Weights   08/16/17 0540 08/17/17 0607 08/18/17 0445  Weight: 150 lb 12.8 oz (68.4 kg) 152 lb 14.4 oz (69.4 kg) 154 lb 1.6 oz (69.9 kg)     Physical Exam   General: Well developed, well nourished, female appearing in no acute distress. Head: Normocephalic, atraumatic.  Neck: Supple without bruits, JVD. Lungs:  Resp regular and unlabored, CTA. Heart: RRR, S1, S2, no S3, S4, or murmur; no rub. Abdomen: Soft, non-tender, non-distended with normoactive bowel sounds. No hepatomegaly. No rebound/guarding. No obvious abdominal masses. Extremities: No clubbing, cyanosis, no edema. Distal pedal pulses are 2+ bilaterally. Neuro: Alert and oriented X 3. Moves all extremities spontaneously. Psych: Normal affect.  Labs    Chemistry Recent Labs Lab 08/14/17 0243 08/15/17 0154 08/16/17 0259  NA 138 137 137  K 3.8 4.5 4.8  CL 102 104 105  CO2 27 26 27   GLUCOSE 104* 113* 105*  BUN 22* 22* 20  CREATININE 0.91 0.86  0.90  CALCIUM 8.9 8.9 8.8*  GFRNONAA 57* >60 58*  GFRAA >60 >60 >60  ANIONGAP 9 7 5      HematologyNo results for input(s): WBC, RBC, HGB, HCT, MCV, MCH, MCHC, RDW, PLT in the last 168 hours.  Cardiac Enzymes Recent Labs Lab 08/11/17 1601 08/11/17 2054  TROPONINI 0.03* <0.03   No results for input(s): TROPIPOC in the last 168 hours.   BNPNo results for input(s): BNP, PROBNP in the last 168 hours.   DDimer No results for input(s): DDIMER in the last 168 hours.   Radiology    Dg Chest 2 View  Result Date: 08/18/2017 CLINICAL DATA:  Status post pacemaker placement. EXAM: CHEST  2 VIEW COMPARISON:  08/12/2017 FINDINGS: A left subclavian approach pacemaker has been placed with leads terminating over the right atrium and right ventricle. The cardiac silhouette is slightly enlarged. Biapical pleural thickening is again noted. The patient has taken a greater inspiration than on the prior study. Mild left basilar opacity likely represents atelectasis. Small pleural effusions have decreased in size. No edema or pneumothorax is seen. No acute osseous abnormality is identified. IMPRESSION: 1. Interval pacemaker  placement.  No pneumothorax. 2. Decreased size of small pleural effusions. Mild left basilar atelectasis. Electronically Signed   By: Logan Bores M.D.   On: 08/18/2017 07:37     Telemetry    V-paced with PVCs- Personally Reviewed  ECG    Atrial sensed, ventricular paced - Personally Reviewed   Cardiac Studies   EP 08/17/17: CONCLUSIONS:   1. Successful implantation of a Medtronic dual-chamber pacemaker for symptomatic atrial fib with an uncontrolled VR followed by AV node ablation creating CHB  2. No early apparent complications.   Medtronic device   Echordiogram 08/11/17: Study Conclusions - Left ventricle: The cavity size was normal. Systolic function was   normal. The estimated ejection fraction was in the range of 55%   to 60%. Wall motion was normal; there were no  regional wall   motion abnormalities. Features are consistent with a pseudonormal   left ventricular filling pattern, with concomitant abnormal   relaxation and increased filling pressure (grade 2 diastolic   dysfunction). Doppler parameters are consistent with high   ventricular filling pressure. - Aortic valve: There was no regurgitation. - Mitral valve: Transvalvular velocity was within the normal range.   There was no evidence for stenosis. There was trivial   regurgitation. - Left atrium: The atrium was severely dilated. - Right ventricle: The cavity size was normal. Wall thickness was   normal. Systolic function was normal. - Tricuspid valve: There was no regurgitation. - Pericardium, extracardiac: There was a left pleural effusion.  Impressions: - Compared with the echo 8/45/36, the systolic function has   improved.  Patient Profile     83 y.o. female with recurrent symptomatic atypical (left-sided) atrial flutter/atrial fibrillation, complicated by acute diastolic heart failure, previously complicated by tachycardia related cardiomyopathy (left ventricular systolic function now returned to normal). Mildly elevated TSH without overt hypothyroidism, on chronic levothyroxine supplement.  Assessment & Plan    1. Symptomatic Afib - s/p AV node ablation creating complete heart block and insertion of dual chamber PPM - per EP, hold eliquis until Sat morning, 08/20/17 - will follow up as an outpatient with EP and cardiology - restart losartan - hold beta blocker - will consider diuretic if she needs pressure control (as recommended by EP) - monitor this morning, if doing well, consider discharge later today - follow up CXR without PTX   2. Hx of non-ischemic cardiomyopathy, grade 2 DD - not on home diuretic - consider adding if she needs pressure control in the absence of home beta blocker - euvolemic on exam   3. Interstitial lung disease - no amiodarone -  stable     Signed, Ledora Bottcher , PA-C 8:31 AM 08/18/2017 Pager: [phone]  Personally seen and examined. Agree with above.  PVC's noted on tele with continued persistent V pacing intervals (does not appear to be sensing PVC's). Tomi Bamberger is aware. Will make sure OK prior to DC.   Wants to walk prior to DC.  Candee Furbish, MD   ADDEN:  Tomi Bamberger with [employer] changed settings on pacer to allow for better sensing of consecutive PVC's. Telemetry observed and correlates with change.   Holding Eliquis until Saturday  Candee Furbish, MD

## 2017-08-18 NOTE — Discharge Summary (Signed)
Discharge Summary    Patient ID: [name],  MRN: [id number], DOB/AGE: 81-Mar-1934 81 y.o.  Admit date: 08/11/2017 Discharge date: 08/18/2017   Primary Care Provider: Lajean Manes Primary Cardiologist: Dr. Radford Pax / Dr. Lovena Le  Discharge Diagnoses    Principal Problem:   Acute diastolic CHF (congestive heart failure) ([place]) Active Problems:   Essential hypertension   PAF (paroxysmal atrial fibrillation) ([facility])   Hypothyroidism   ILD (interstitial lung disease) ([facility])   Allergies Allergies  Allergen Reactions  . Adhesive [Tape] Rash    Including EKG electrodes and the like.  . Codeine Other (See Comments)    unknown  . Latex Itching  . Percodan [Oxycodone-Aspirin] Nausea And Vomiting    Nausea      History of Present Illness     [name] is a 83 y.o. female with a hx of s/p AVNRT ablation with hx of SVT in 2011, HTN, MVP, PAF, L breast cancer (in remission - followed by Dr. Lindi Adie) and chronic venous insufficiency who is being seen for the evaluation of afib and CHF.  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 Ablation was not attempted.She also had inducible AVNRT at a slower rate and evidence of AV conduction system disease.   Seen in afib clinic 07/14/17 with fatigue and shortness of breath. Noted in afib. She under Underwent successful cardioversion by Dr. Marlou Porch on 08/10/17.  [name] woke up around 3 AM 08/10/17 to go to the bathroom. Later she developed a severe palpitation with shortness of breath. EMS was called and found to have a A. fib RVR at a rate of 136 bpm. She converted to sinus rhythm in route. Maintaining sinus rhythm here.  EKG shows sinus rhythm. BNP elevated to 574. Point-of-care troponin negative. Troponin I less than 0.03. Electrolyte and kidney function normal. TSH elevated to 7.1. D-dimer 0.75. She is  compliant with her Eliquis. Her breathing is resolved with IV lasix 40mg   x 1.   Hospital Course     Consultants: EP   EP was consulted for further management of her atrial fibrillation in the setting of dilated left atrium with mixed fib/flutter. She subsequently underwent AV node ablation resulting in CHB and dual chamber PPM with Dr. Lovena Le 08/17/17. Telemetry on 08/18/17 with PVCs and inappropriate pacing. Medtronic rep contacted and sensitivity was adjusted. PVCs now without pacing spikes.   She was instructed to hold eliquis until the morning of 08/20/17. Toprol was discontinued in the setting of CHB s/p ablation. Continue with losartan. If she is hypertensive after discharge, may consider adding a diuretic for better pressure control vs titrating losartan.   Grad 2 diastolic dysfunction She is euvolemic on exam. Discharge weight is 154 lbs.  Patient seen and examined by Dr. Marlou Porch today and was stable for discharge. All follow up has been arranged.  _____________  Discharge Vitals Blood pressure (!) 134/92, pulse 89, temperature 98 F (36.7 C), temperature source Axillary, resp. rate 15, height 5\' 7"  (1.702 m), weight 154 lb 1.6 oz (69.9 kg), SpO2 99 %.  Filed Weights   08/16/17 0540 08/17/17 0607 08/18/17 0445  Weight: 150 lb 12.8 oz (68.4 kg) 152 lb 14.4 oz (69.4 kg) 154 lb 1.6 oz (69.9 kg)    Labs & Radiologic Studies    CBC No results for input(s): WBC, NEUTROABS, HGB, HCT,  MCV, PLT in the last 72 hours. Basic Metabolic Panel  Recent Labs  08/16/17 0259  NA 137  K 4.8  CL 105  CO2 27  GLUCOSE 105*  BUN 20  CREATININE 0.90  CALCIUM 8.8*   Liver Function Tests No results for input(s): AST, ALT, ALKPHOS, BILITOT, PROT, ALBUMIN in the last 72 hours. No results for input(s): LIPASE, AMYLASE in the last 72 hours. Cardiac Enzymes No results for input(s): CKTOTAL, CKMB, CKMBINDEX, TROPONINI in the last 72 hours. BNP Invalid input(s): POCBNP D-Dimer No results for  input(s): DDIMER in the last 72 hours. Hemoglobin A1C No results for input(s): HGBA1C in the last 72 hours. Fasting Lipid Panel No results for input(s): CHOL, HDL, LDLCALC, TRIG, CHOLHDL, LDLDIRECT in the last 72 hours. Thyroid Function Tests No results for input(s): TSH, T4TOTAL, T3FREE, THYROIDAB in the last 72 hours.  Invalid input(s): FREET3 _____________  Dg Chest 2 View  Result Date: 08/18/2017 CLINICAL DATA:  Status post pacemaker placement. EXAM: CHEST  2 VIEW COMPARISON:  08/12/2017 FINDINGS: A left subclavian approach pacemaker has been placed with leads terminating over the right atrium and right ventricle. The cardiac silhouette is slightly enlarged. Biapical pleural thickening is again noted. The patient has taken a greater inspiration than on the prior study. Mild left basilar opacity likely represents atelectasis. Small pleural effusions have decreased in size. No edema or pneumothorax is seen. No acute osseous abnormality is identified. IMPRESSION: 1. Interval pacemaker placement.  No pneumothorax. 2. Decreased size of small pleural effusions. Mild left basilar atelectasis. Electronically Signed   By: Logan Bores M.D.   On: 08/18/2017 07:37   Dg Chest 2 View  Result Date: 08/12/2017 CLINICAL DATA:  Shortness of breath.  CHF EXAM: CHEST  2 VIEW COMPARISON:  Yesterday FINDINGS: Cardiomegaly. Stable aortic contours. Low volume chest with biapical pleural based scar like opacity. Small pleural effusions. No pulmonary edema seen today. IMPRESSION: Resolved pulmonary edema.  Small bilateral pleural effusion. Electronically Signed   By: Monte Fantasia M.D.   On: 08/12/2017 07:40   Dg Chest Port 1 View  Result Date: 08/11/2017 CLINICAL DATA:  Shortness of breath. EXAM: PORTABLE CHEST 1 VIEW COMPARISON:  CT 02/17/2017 . FINDINGS: Mediastinum hilar structures normal. Cardiomegaly with mild bilateral from interstitial prominence and small bilateral pleural effusions. Findings suggest CHF.  No pneumothorax. IMPRESSION: Congestive heart failure with mild bilateral pulmonary interstitial edema and small bilateral pleural effusions. Electronically Signed   By: Marcello Moores  Register   On: 08/11/2017 06:27     Diagnostic Studies/Procedures    EP 08/17/17: CONCLUSIONS:  1. Successful implantation of a Medtronic dual-chamber pacemaker for symptomatic atrial fib with an uncontrolled VR followed by AV node ablation creating CHB 2. No early apparent complications.   Medtronic device   Echordiogram 08/11/17: Study Conclusions - Left ventricle: The cavity size was normal. Systolic function was normal. The estimated ejection fraction was in the range of 55% to 60%. Wall motion was normal; there were no regional wall motion abnormalities. Features are consistent with a pseudonormal left ventricular filling pattern, with concomitant abnormal relaxation and increased filling pressure (grade 2 diastolic dysfunction). Doppler parameters are consistent with high ventricular filling pressure. - Aortic valve: There was no regurgitation. - Mitral valve: Transvalvular velocity was within the normal range. There was no evidence for stenosis. There was trivial regurgitation. - Left atrium: The atrium was severely dilated. - Right ventricle: The cavity size was normal. Wall thickness was normal. Systolic function was normal. - Tricuspid valve:  There was no regurgitation. - Pericardium, extracardiac: There was a left pleural effusion.  Impressions: - Compared with the echo 8/67/61, the systolic function has improved.   Disposition   Pt is being discharged home today in good condition.  Follow-up Plans & Appointments    Follow-up Information    Stoneking, Hal, MD. Schedule an appointment as soon as possible for a visit in 2 week(s).   Specialty:  Internal Medicine Contact information: [address] [place] [address] [phone]         [facility] Follow up on 09/01/2017.   Specialty:  Cardiology Why:  at [facility] information: [address] [place]       Evans Lance, MD Follow up on 11/23/2017.   Specialty:  Cardiology Why:  at 9:15AM Contact information: [place]. [address] [name] [address] [phone]          Discharge Instructions    Diet - low sodium heart healthy    Complete by:  As directed    Increase activity slowly    Complete by:  As directed       Discharge Medications   Current Discharge Medication List    CONTINUE these medications which have CHANGED   Details  apixaban (ELIQUIS) 5 MG TABS tablet Take 1 tablet (5 mg total) by mouth 2 (two) times daily. Resume medication the morning of 08/20/17. Qty: 180 tablet, Refills: 1    losartan (COZAAR) 50 MG tablet Take 1 tablet (50 mg total) by mouth daily. Qty: 90 tablet, Refills: 3      CONTINUE these medications which have NOT CHANGED   Details  antiseptic oral rinse (BIOTENE) LIQD 15 mLs by Mouth Rinse route as needed for dry mouth.    levothyroxine (SYNTHROID, LEVOTHROID) 75 MCG tablet Take 75 mcg by mouth daily before breakfast.    oxybutynin (DITROPAN) 5 MG tablet Take 5 mg by mouth 2 (two) times daily.    pantoprazole (PROTONIX) 40 MG tablet Take 40 mg by mouth daily.     vitamin B-12 (CYANOCOBALAMIN) 100 MCG tablet Take 100 mcg by mouth daily.    Vitamin D, Ergocalciferol, (DRISDOL) 50000 units CAPS capsule Take 50,000 Units by mouth every 7 (seven) days.    Carboxymethylcellul-Glycerin (LUBRICATING EYE DROPS OP) Apply 1 drop to eye daily as needed (dry eyes).      STOP taking these medications     metoprolol succinate (TOPROL-XL) 50 MG 24 hr tablet           Outstanding Labs/Studies   Follow up with EP OP. Add diuretic if hypertensive.   Duration of Discharge Encounter   Greater than 30 minutes including physician  time.  Signed, Tami Lin Duke PA-C 08/18/2017, 2:56 PM  Personally seen and examined. Agree with above.  83 year old with uncontrollable atrial fibrillation.  AV nodal ablation and His bundle pacing performed Sensitivity adjusted to allow proper sensing of PVC's  Doing well. Ready for DC  Restarting Eliquis on weekend 9/8.  If HTN increases with discontinuation of metoprolol, consider diuretic, HCTZ  Candee Furbish, MD

## 2017-08-18 NOTE — Progress Notes (Signed)
Patient ID: [name], female   DOB: October 18, 1933, 83 y.o.   MRN: [id number]  EP Attending  Discharge note to follow. She is doing well this morning after PPM (His bundle) insertion with AV node ablation. PPM interogation under my direction demonstrates normal PM function. Her CXR looks good. ECG demonstrates a narrow QRS with His bundle pacing. QRS most narrow at lower outputs (His capture only) but will leave at higher output for safety purposes. She may be discharged home.  I would like for her to hold her Eliquis until Saturday morning. I have restarted her losartan. Would hold her beta blocker. It could be restarted or she be given a diuretic (better option) if needed in the future for blood pressure control. We will turn her rates down from 90 to 80 as an outpatient.  Mikle Bosworth.D.

## 2017-08-18 NOTE — Plan of Care (Signed)
Problem: Safety: Goal: Ability to remain free from injury will improve Outcome: Not Applicable Date Met: 57/84/69 Educated on need to use call light to call for assistance prior to ambulation. Patient verbalizes understanding and implements use of call light when necessary  Problem: Physical Regulation: Goal: Ability to maintain clinical measurements within normal limits will improve Outcome: Progressing vss

## 2017-08-22 ENCOUNTER — Telehealth: Payer: Self-pay

## 2017-08-22 ENCOUNTER — Telehealth: Payer: Self-pay | Admitting: Cardiology

## 2017-08-22 DIAGNOSIS — I5032 Chronic diastolic (congestive) heart failure: Secondary | ICD-10-CM

## 2017-08-22 MED ORDER — HYDROCHLOROTHIAZIDE 25 MG PO TABS: 25.0000 mg | ORAL_TABLET | Freq: Every day | ORAL | 3 refills | Status: DC

## 2017-08-22 NOTE — Telephone Encounter (Signed)
Call placed to Pt.  Pt with wt gain of 4 pounds in 24 hours.  Received verbal order from Dr. Lovena Le, Pt start HCTZ 25 mg daily by mouth.  Repeat BMP in 2 weeks.  Decrease salt intake.  Pt with wound check 09/01/2017, will schedule repeat BMP for same day.  Pt indicates understanding of all teaching.

## 2017-08-22 NOTE — Telephone Encounter (Signed)
Patient called and stated that she has had a weight gain. Patient has gained 4 lbs in 24 hours. She stated that it was on her discharge summary to call if she was to have a weight gain. Informed patient that I would send message to MD and his nurse. Pt verbalized understanding.

## 2017-08-23 ENCOUNTER — Ambulatory Visit: Payer: Medicare Other | Admitting: Internal Medicine

## 2017-08-23 NOTE — Telephone Encounter (Signed)
Instructed to start lasix. GT

## 2017-09-01 ENCOUNTER — Other Ambulatory Visit: Payer: Medicare Other | Admitting: *Deleted

## 2017-09-01 ENCOUNTER — Ambulatory Visit (INDEPENDENT_AMBULATORY_CARE_PROVIDER_SITE_OTHER): Payer: Medicare Other | Admitting: *Deleted

## 2017-09-01 DIAGNOSIS — I481 Persistent atrial fibrillation: Secondary | ICD-10-CM | POA: Diagnosis not present

## 2017-09-01 DIAGNOSIS — I4819 Other persistent atrial fibrillation: Secondary | ICD-10-CM

## 2017-09-01 DIAGNOSIS — I5032 Chronic diastolic (congestive) heart failure: Secondary | ICD-10-CM

## 2017-09-01 LAB — CUP PACEART INCLINIC DEVICE CHECK
Battery Voltage: 3.19 V
Brady Statistic AS VS Percent: 0 %
Brady Statistic RA Percent Paced: 0 %
Brady Statistic RV Percent Paced: 93.09 %
Date Time Interrogation Session: 20180920135710
Implantable Lead Implant Date: 20180905
Implantable Lead Location: 753859
Implantable Lead Location: 753860
Implantable Lead Model: 3830
Implantable Lead Model: 5076
Implantable Pulse Generator Implant Date: 20180905
Lead Channel Impedance Value: 418 Ohm
Lead Channel Impedance Value: 627 Ohm
Lead Channel Pacing Threshold Amplitude: 0.5 V
Lead Channel Pacing Threshold Pulse Width: 0.4 ms
Lead Channel Sensing Intrinsic Amplitude: 2 mV
Lead Channel Sensing Intrinsic Amplitude: 4.875 mV
Lead Channel Sensing Intrinsic Amplitude: 5.375 mV
Lead Channel Setting Pacing Amplitude: 3.5 V
Lead Channel Setting Sensing Sensitivity: 1.2 mV
MDC IDC LEAD IMPLANT DT: 20180905
MDC IDC MSMT BATTERY REMAINING LONGEVITY: 66 mo
MDC IDC MSMT LEADCHNL RA IMPEDANCE VALUE: 361 Ohm
MDC IDC MSMT LEADCHNL RV IMPEDANCE VALUE: 266 Ohm
MDC IDC MSMT LEADCHNL RV SENSING INTR AMPL: 2 mV
MDC IDC SET LEADCHNL RV PACING PULSEWIDTH: 1 ms
MDC IDC STAT BRADY AP VP PERCENT: 0 %
MDC IDC STAT BRADY AP VS PERCENT: 0 %
MDC IDC STAT BRADY AS VP PERCENT: 0 %

## 2017-09-01 LAB — BASIC METABOLIC PANEL
BUN/Creatinine Ratio: 19 (ref 12–28)
BUN: 18 mg/dL (ref 8–27)
CALCIUM: 9.3 mg/dL (ref 8.7–10.3)
CO2: 24 mmol/L (ref 20–29)
Chloride: 102 mmol/L (ref 96–106)
Creatinine, Ser: 0.94 mg/dL (ref 0.57–1.00)
GFR, EST AFRICAN AMERICAN: 65 mL/min/{1.73_m2} (ref >59)
GFR, EST NON AFRICAN AMERICAN: 57 mL/min/{1.73_m2} — AB (ref >59)
Glucose: 85 mg/dL (ref 65–99)
Potassium: 3.7 mmol/L (ref 3.5–5.2)
Sodium: 142 mmol/L (ref 134–144)

## 2017-09-01 NOTE — Progress Notes (Signed)
Wound check appointment. Steri-strips removed. Wound without redness or edema. Incision edges approximated, wound well healed. Normal device function. RV Threshold, sensing, and impedances consistent with implant measurements. Selective HIS capture noted at 3.0V until loss at 0.25V @ 1.27ms Device programmed at 3.5V for extra safety margin until 3 month visit. Histogram distribution appropriate for patient and level of activity. 100% AT/AF + Eliquis. No high ventricular rates noted. Per GT lower rate decreased to 80bpm. Patient educated about wound care, arm mobility, lifting restrictions. ROV in 11/23/17 w/ GT.

## 2017-09-07 ENCOUNTER — Telehealth: Payer: Self-pay | Admitting: Cardiology

## 2017-09-07 NOTE — Telephone Encounter (Signed)
Patient called and stated that she went to [place] over the weekend and she was walking up the hill and was more short of breath than usual. She wanted to know if this was normal. Instructed pt to send a manual transmission. Call transferred to [place].

## 2017-09-07 NOTE — Telephone Encounter (Signed)
Ms. Burleigh had an appointment with the [facility] 09/01/17 for a wound check s/p AVN ablation and PPM implant. Histograms flat, rate response needs to be turned on. She will come to the [facility] tomorrow 09/08/17 for that to be programmed. I scheduled 6 week f/u with Chanetta Marshall, NP per HIS bundle pacing protocol on 09/20/17. She is agreeable to all appointments.

## 2017-09-08 ENCOUNTER — Ambulatory Visit (INDEPENDENT_AMBULATORY_CARE_PROVIDER_SITE_OTHER): Payer: Self-pay | Admitting: *Deleted

## 2017-09-08 DIAGNOSIS — I481 Persistent atrial fibrillation: Secondary | ICD-10-CM

## 2017-09-08 DIAGNOSIS — I4819 Other persistent atrial fibrillation: Secondary | ICD-10-CM

## 2017-09-08 DIAGNOSIS — Z950 Presence of cardiac pacemaker: Secondary | ICD-10-CM

## 2017-09-08 LAB — CUP PACEART INCLINIC DEVICE CHECK
Battery Remaining Longevity: 67 mo
Battery Voltage: 3.18 V
Brady Statistic AP VS Percent: 0 %
Brady Statistic RA Percent Paced: 0 %
Date Time Interrogation Session: 20180927172937
Implantable Lead Implant Date: 20180905
Implantable Lead Implant Date: 20180905
Implantable Lead Location: 753859
Lead Channel Impedance Value: 266 Ohm
Lead Channel Setting Sensing Sensitivity: 1.2 mV
MDC IDC LEAD LOCATION: 753860
MDC IDC MSMT LEADCHNL RA IMPEDANCE VALUE: 323 Ohm
MDC IDC MSMT LEADCHNL RA IMPEDANCE VALUE: 475 Ohm
MDC IDC MSMT LEADCHNL RV IMPEDANCE VALUE: 418 Ohm
MDC IDC PG IMPLANT DT: 20180905
MDC IDC SET LEADCHNL RV PACING AMPLITUDE: 3.5 V
MDC IDC SET LEADCHNL RV PACING PULSEWIDTH: 1 ms
MDC IDC STAT BRADY AP VP PERCENT: 0 %
MDC IDC STAT BRADY AS VP PERCENT: 0 %
MDC IDC STAT BRADY AS VS PERCENT: 0 %
MDC IDC STAT BRADY RV PERCENT PACED: 90.7 %

## 2017-09-08 NOTE — Progress Notes (Signed)
Pacemaker check in clinic to enable rate response. No testing performed. 100% AT/AF burden, +Eliquis. Lower rate maintained at 80bpm (s/p AVN ablation, rate reduced at 9/20 OV), rate response turned on at nominal settings, activity threshold at medium low. Patient walked in office and denied any symptoms of chest discomfort or ShOB. ROV with AS on 09/20/17 and ROV with GT on 11/23/17.

## 2017-09-09 ENCOUNTER — Encounter: Payer: Self-pay | Admitting: Nurse Practitioner

## 2017-09-17 NOTE — Progress Notes (Signed)
Electrophysiology Office Note Date: 09/20/2017  ID:  [name], DOB [birth year], MRN [id number]  PCP: Lajean Manes, MD Primary Cardiologist: Radford Pax Electrophysiologist: Lovena Le  CC: Pacemaker follow-up  [name] is a 83 y.o. female seen today for Dr Lovena Le.  She presents today for routine electrophysiology followup.  Since last being seen in our clinic, the patient reports doing very well. She has had significant improvement post AVN ablation and pacing. She denies chest pain, palpitations, dyspnea, PND, orthopnea, nausea, vomiting, dizziness, syncope, edema, weight gain, or early satiety.  Device History: MDT dual chamber (HIS Bundle) PPM implanted 2018 for persistent AF (AVN ablation)   Past Medical History:  Diagnosis Date  . Allergic rhinitis   . Anemia   . Breast cancer, left breast ([place]) 01/15/15   Invasive Mammary  . Chronic venous insufficiency   . Hematuria    negative workup - Dr. Reece Agar  . Hypertension   . Migraine headache   . MVP (mitral valve prolapse)   . PAF (paroxysmal atrial fibrillation) ([place])   . SVT (supraventricular tachycardia) ([place]) 06/2010   s/p AVNRT ablation  . Wears glasses    Past Surgical History:  Procedure Laterality Date  . A-FLUTTER ABLATION N/A 02/04/2017   Procedure: A-Flutter Ablation;  Surgeon: Evans Lance, MD;  Location: [place] [facility];  Service: Cardiovascular;  Laterality: N/A;  . ABDOMINAL HYSTERECTOMY    . AV NODE ABLATION N/A 08/17/2017   Procedure: AV Node Ablation;  Surgeon: Evans Lance, MD;  Location: [place] [facility];  Service: Cardiovascular;  Laterality: N/A;  . BREAST LUMPECTOMY WITH RADIOACTIVE SEED LOCALIZATION Left 02/14/2015   Procedure: BREAST LUMPECTOMY WITH RADIOACTIVE SEED LOCALIZATION;  Surgeon: Alphonsa Overall, MD;  Location: [place];  Service: General;  Laterality: Left;  . BREAST SURGERY  1990   rt br bx  . CARDIAC CATHETERIZATION  2011   ablasion  . CARDIOVERSION N/A  08/10/2017   Procedure: CARDIOVERSION;  Surgeon: Jerline Pain, MD;  Location: [facility];  Service: Cardiovascular;  Laterality: N/A;  . COLONOSCOPY    . DILATION AND CURETTAGE OF UTERUS    . Left Breast Biopsy  01/15/15  . PACEMAKER IMPLANT N/A 08/17/2017   Procedure: Pacemaker Implant;  Surgeon: Evans Lance, MD;  Location: [place] [facility];  Service: Cardiovascular;  Laterality: N/A;  . TUBAL LIGATION      Current Outpatient Prescriptions  Medication Sig Dispense Refill  . antiseptic oral rinse (BIOTENE) LIQD 15 mLs by Mouth Rinse route as needed for dry mouth.    Marland Kitchen apixaban (ELIQUIS) 5 MG TABS tablet Take 1 tablet (5 mg total) by mouth 2 (two) times daily. Resume medication the morning of 08/20/17. 180 tablet 1  . Carboxymethylcellul-Glycerin (LUBRICATING EYE DROPS OP) Apply 1 drop to eye daily as needed (dry eyes).    . hydrochlorothiazide (HYDRODIURIL) 25 MG tablet Take 1 tablet (25 mg total) by mouth daily. 90 tablet 3  . levothyroxine (SYNTHROID, LEVOTHROID) 75 MCG tablet Take 75 mcg by mouth daily before breakfast.    . losartan (COZAAR) 50 MG tablet Take 1 tablet (50 mg total) by mouth daily. 90 tablet 3  . pantoprazole (PROTONIX) 40 MG tablet Take 40 mg by mouth daily.     . vitamin B-12 (CYANOCOBALAMIN) 100 MCG tablet Take 100 mcg by mouth daily.    . Vitamin D, Ergocalciferol, (DRISDOL) 50000 units CAPS capsule Take 50,000 Units by mouth every 7 (seven) days.  No current facility-administered medications for this visit.     Allergies:   Adhesive [tape]; Codeine; Latex; and Percodan [oxycodone-aspirin]   Social History: Social History   Social History  . Marital status: Married    Spouse name: N/A  . Number of children: 3  . Years of education: N/A   Occupational History  . Not on file.   Social History Main Topics  . Smoking status: Never Smoker  . Smokeless tobacco: Never Used  . Alcohol use 0.0 oz/week     Comment: once a week  . Drug use: No  . Sexual  activity: Not on file   Other Topics Concern  . Not on file   Social History Narrative   LIves with husband.    Retired.     Family History: Family History  Problem Relation Age of Onset  . Hypertension Mother   . Heart disease Mother   . Arthritis Mother   . Other Father        hardening of the arteries  . Heart Problems Sister        pacemaker  . Heart attack Brother   . Throat cancer Paternal Uncle   . Breast cancer Sister        early 23s  . Alzheimer's disease Sister   . Pulmonary disease Sister   . Breast cancer Daughter 31       Negative genetic testing   . Breast cancer Paternal Aunt        dx over 33  . Cancer Paternal Aunt        cancer in her leg  . Breast cancer Cousin        multiple paternal cousin     Review of Systems: All other systems reviewed and are otherwise negative except as noted above.   Physical Exam: VS:  BP 126/82   Pulse 84   Ht 5' 7.5" (1.715 m)   Wt 159 lb (72.1 kg)   LMP  (LMP Unknown)   BMI 24.54 kg/m  , BMI Body mass index is 24.54 kg/m.  GEN- The patient is well appearing, alert and oriented x 3 today.   HEENT: normocephalic, atraumatic; sclera clear, conjunctiva pink; hearing intact; oropharynx clear; neck supple  Lungs- Clear to ausculation bilaterally, normal work of breathing.  No wheezes, rales, rhonchi Heart- Regular rate and rhythm (paced)  GI- soft, non-tender, non-distended, bowel sounds present  Extremities- no clubbing, cyanosis, or edema  MS- no significant deformity or atrophy Skin- warm and dry, no rash or lesion; PPM pocket well healed Psych- euthymic mood, full affect Neuro- strength and sensation are intact  PPM Interrogation- reviewed in detail today,  See PACEART report  EKG:  EKG is ordered today. The ekg ordered today shows AF with V pacing, PVC's  Recent Labs: 08/11/2017: B Natriuretic Peptide 574.4; Hemoglobin 11.6; Magnesium 1.9; Platelets 197; TSH 7.180 09/01/2017: BUN 18; Creatinine, Ser  0.94; Potassium 3.7; Sodium 142   Wt Readings from Last 3 Encounters:  09/20/17 159 lb (72.1 kg)  08/18/17 154 lb 1.6 oz (69.9 kg)  08/10/17 162 lb (73.5 kg)     Other studies Reviewed: Additional studies/ records that were reviewed today include: hospital records  Assessment and Plan:  1.  Permanent AF with RVR Doing well s/p PPM and AVN ablation Normal PPM function See Pace Art report Lower rate decreased to 70bpm today His Bundle pacing threshold selective down to loss of capture at 0.75V Continue Eliquis for CHADS2VASC of 4  2.  Chronic diastolic heart failure Euvolemic today Significantly improved post AVN ablation and pacing   3.  HTN Stable No change required today    Current medicines are reviewed at length with the patient today.   The patient does not have concerns regarding her medicines.  The following changes were made today:  none  Labs/ tests ordered today include: none Orders Placed This Encounter  Procedures  . CUP PACEART [place]  . EKG 12-Lead     Disposition:   Follow up with Dr Lovena Le as scheduled   Signed, Chanetta Marshall, NP 09/20/2017 9:28 AM  [place] [address] [place] [address] [phone] (office) [phone] (fax)

## 2017-09-20 ENCOUNTER — Ambulatory Visit (INDEPENDENT_AMBULATORY_CARE_PROVIDER_SITE_OTHER): Payer: Medicare Other | Admitting: Nurse Practitioner

## 2017-09-20 ENCOUNTER — Encounter: Payer: Self-pay | Admitting: Nurse Practitioner

## 2017-09-20 VITALS — BP 126/82 | HR 84 | Ht 67.5 in | Wt 159.0 lb

## 2017-09-20 DIAGNOSIS — I10 Essential (primary) hypertension: Secondary | ICD-10-CM

## 2017-09-20 DIAGNOSIS — I484 Atypical atrial flutter: Secondary | ICD-10-CM

## 2017-09-20 DIAGNOSIS — I4821 Permanent atrial fibrillation: Secondary | ICD-10-CM

## 2017-09-20 DIAGNOSIS — I5032 Chronic diastolic (congestive) heart failure: Secondary | ICD-10-CM

## 2017-09-20 DIAGNOSIS — I482 Chronic atrial fibrillation: Secondary | ICD-10-CM

## 2017-09-20 LAB — CUP PACEART INCLINIC DEVICE CHECK
Date Time Interrogation Session: 20181009081800
Implantable Lead Location: 753859
Implantable Lead Location: 753860
Implantable Lead Model: 3830
Implantable Lead Model: 5076
MDC IDC LEAD IMPLANT DT: 20180905
MDC IDC LEAD IMPLANT DT: 20180905
MDC IDC PG IMPLANT DT: 20180905

## 2017-09-20 NOTE — Patient Instructions (Signed)
Medication Instructions:   Your physician recommends that you continue on your current medications as directed. Please refer to the Current Medication list given to you today.   If you need a refill on your cardiac medications before your next appointment, please call your pharmacy.  Labwork: NONE ORDERED  TODAY    Testing/Procedures: NONE ORDERED  TODAY   Follow-Up:   AS SCHEDULED   Any Other Special Instructions Will Be Listed Below (If Applicable).                                                                                                                                                   

## 2017-11-23 ENCOUNTER — Encounter: Payer: Medicare Other | Admitting: Internal Medicine

## 2017-11-23 ENCOUNTER — Telehealth: Payer: Self-pay | Admitting: Internal Medicine

## 2017-11-23 NOTE — Telephone Encounter (Signed)
NEw Message  Pt call requesting to speak with RN about getting her appt rescheduled with only Dr. Lovena Le for sooner than next available. Please call back to discuss

## 2017-11-24 NOTE — Telephone Encounter (Signed)
Pt scheduled for f/u appt.  No further action needed.

## 2017-11-29 ENCOUNTER — Ambulatory Visit: Payer: Medicare Other | Admitting: Internal Medicine

## 2017-11-29 ENCOUNTER — Encounter: Payer: Self-pay | Admitting: Internal Medicine

## 2017-11-29 VITALS — BP 132/88 | HR 75 | Ht 69.0 in | Wt 158.4 lb

## 2017-11-29 DIAGNOSIS — I481 Persistent atrial fibrillation: Secondary | ICD-10-CM

## 2017-11-29 DIAGNOSIS — Z950 Presence of cardiac pacemaker: Secondary | ICD-10-CM | POA: Diagnosis not present

## 2017-11-29 DIAGNOSIS — I4819 Other persistent atrial fibrillation: Secondary | ICD-10-CM

## 2017-11-29 NOTE — Progress Notes (Signed)
HPI [name] returns today for ongoing evaluation of her atrial fib with a RVR, s/p AV node ablation and his bundle PPM insertion.  The patient is an 83 year old woman who is developed persistent atrial fibrillation with a very rapid ventricular response.  Since her procedure she has done very well with no palpitations.  She does have dyspnea when she walks up a flight of stairs or steep incline and has to slow down.  She does Tracy have symptomatic atrial fibrillation.  Today on follow-up she thought she was back in sinus rhythm.  No peripheral edema.  She admits to some sodium indiscretion. Allergies  Allergen Reactions  . Adhesive [Tape] Rash    Including EKG electrodes and the like.  . Codeine Other (See Comments)    unknown  . Latex Itching  . Percodan [Oxycodone-Aspirin] Nausea And Vomiting    Nausea      Current Outpatient Medications  Medication Sig Dispense Refill  . antiseptic oral rinse (BIOTENE) LIQD 15 mLs by Mouth Rinse route as needed for dry mouth.    Marland Kitchen apixaban (ELIQUIS) 5 MG TABS tablet Take 1 tablet (5 mg total) by mouth 2 (two) times daily. Resume medication the morning of 08/20/17. 180 tablet 1  . Carboxymethylcellul-Glycerin (LUBRICATING EYE DROPS OP) Apply 1 drop to eye daily as needed (dry eyes).    . hydrochlorothiazide (HYDRODIURIL) 25 MG tablet Take 1 tablet (25 mg total) by mouth daily. 90 tablet 3  . levothyroxine (SYNTHROID, LEVOTHROID) 75 MCG tablet Take 75 mcg by mouth daily before breakfast.    . losartan (COZAAR) 50 MG tablet Take 1 tablet (50 mg total) by mouth daily. 90 tablet 3  . pantoprazole (PROTONIX) 40 MG tablet Take 40 mg by mouth daily.     . vitamin B-12 (CYANOCOBALAMIN) 100 MCG tablet Take 100 mcg by mouth daily.    . Vitamin D, Ergocalciferol, (DRISDOL) 50000 units CAPS capsule Take 50,000 Units by mouth every 7 (seven) days.     No current facility-administered medications for this visit.      Past Medical History:  Diagnosis  Date  . Allergic rhinitis   . Anemia   . Breast cancer, left breast ([name]) 01/15/15   Invasive Mammary  . Chronic venous insufficiency   . Hematuria    negative workup - Dr. Reece Green  . Hypertension   . Migraine headache   . MVP (mitral valve prolapse)   . PAF (paroxysmal atrial fibrillation) ([place])   . SVT (supraventricular tachycardia) ([place]) 06/2010   s/p AVNRT ablation  . Wears glasses     ROS:   All systems reviewed and negative except as noted in the HPI.   Past Surgical History:  Procedure Laterality Date  . A-FLUTTER ABLATION N/A 02/04/2017   Procedure: A-Flutter Ablation;  Surgeon: Tracy Lance, MD;  Location: [place] [facility];  Service: Cardiovascular;  Laterality: N/A;  . ABDOMINAL HYSTERECTOMY    . AV NODE ABLATION N/A 08/17/2017   Procedure: AV Node Ablation;  Surgeon: Tracy Lance, MD;  Location: [place] [facility];  Service: Cardiovascular;  Laterality: N/A;  . BREAST LUMPECTOMY WITH RADIOACTIVE SEED LOCALIZATION Left 02/14/2015   Procedure: BREAST LUMPECTOMY WITH RADIOACTIVE SEED LOCALIZATION;  Surgeon: Tracy Overall, MD;  Location: [place];  Service: General;  Laterality: Left;  . BREAST SURGERY  1990   rt br bx  . CARDIAC CATHETERIZATION  2011   ablasion  . CARDIOVERSION N/A 08/10/2017   Procedure:  CARDIOVERSION;  Surgeon: Tracy Pain, MD;  Location: [facility];  Service: Cardiovascular;  Laterality: N/A;  . COLONOSCOPY    . DILATION AND CURETTAGE OF UTERUS    . Left Breast Biopsy  01/15/15  . PACEMAKER IMPLANT N/A 08/17/2017   Procedure: Pacemaker Implant;  Surgeon: Tracy Lance, MD;  Location: [place] [facility];  Service: Cardiovascular;  Laterality: N/A;  . TUBAL LIGATION       Family History  Problem Relation Age of Onset  . Hypertension Mother   . Heart disease Mother   . Arthritis Mother   . Other Father        hardening of the arteries  . Heart Problems Sister        pacemaker  . Heart attack Brother   . Throat cancer  Paternal Uncle   . Breast cancer Sister        early 67s  . Alzheimer's disease Sister   . Pulmonary disease Sister   . Breast cancer Daughter 31       Negative genetic testing   . Breast cancer Paternal Aunt        dx over 39  . Cancer Paternal Aunt        cancer in her leg  . Breast cancer Cousin        multiple paternal cousin     Social History   Socioeconomic History  . Marital status: Married    Spouse name: [name]  . Number of children: 3  . Years of education: [name]  . Highest education level: [name]  Social Needs  . Financial resource strain: [name]  . Food insecurity - worry: [name]  . Food insecurity - inability: [name]  . Transportation needs - medical: [name]  . Transportation needs - non-medical: [name]  Occupational History  . [name]  Tobacco Use  . Smoking status: Never Smoker  . Smokeless tobacco: Never Used  Substance and Sexual Activity  . Alcohol use: Yes    Alcohol/week: 0.0 oz    Comment: once a week  . Drug use: No  . Sexual activity: [name]  Other Topics Concern  . [name]  Social History Narrative   LIves with husband.    Retired.      BP 132/88   Pulse 75   Ht 5\' 9"  (1.753 m)   Wt 158 lb 6.4 oz (71.8 kg)   LMP  (LMP Unknown)   SpO2 95%   BMI 23.39 kg/m   Physical Exam:  Well appearing 83 year old woman, NAD HEENT: Unremarkable Neck: 6 cm JVD, no thyromegally Lymphatics:  No adenopathy Back:  No CVA tenderness Lungs:  Clear, with no wheezes, rales, or rhonchi HEART:  Regular rate rhythm, no murmurs, no rubs, no clicks Abd:  soft, positive bowel sounds, no organomegally, no rebound, no guarding Ext:  2 plus pulses, no edema, no cyanosis, no clubbing Skin:  No rashes no nodules Neuro:  CN II through XII intact, motor grossly intact  EKG -atrial fibrillation with ventricular pacing and a narrow QRS  DEVICE  Normal device function.  See PaceArt for details.    Assess/Plan: 1.  Persistent/permanent atrial fibrillation with a rapid ventricular response -she is now asymptomatic status post pacemaker insertion and AV node ablation. 2.  Pacemaker -her [name] dual-chamber pacemaker is working normally.  Her atrial lead is in the right atrium and  her RV lead is in the His bundle region and is pacing appropriately.  Her thresholds are good.  We have turned down her outputs. 3.  Chronic diastolic heart failure -she appears to be euvolemic.  She is encouraged to maintain a low-sodium diet and to continue regular daily activity. 4.  Hypertension -her blood pressure today is well controlled.  She will continue her current medications.  Crissie Sickles, MD

## 2017-11-29 NOTE — Patient Instructions (Addendum)
Medication Instructions:  Your physician recommends that you continue on your current medications as directed. Please refer to the Current Medication list given to you today.  Labwork: None ordered.  Testing/Procedures: None ordered.  Follow-Up: Your physician wants you to follow-up in: 9 months with Dr. Lovena Le.   You will receive a reminder letter in the mail two months in advance. If you don't receive a letter, please call our office to schedule the follow-up appointment.  Remote monitoring is used to monitor your Pacemaker from home. This monitoring reduces the number of office visits required to check your device to one time per year. It allows [place] to keep an eye on the functioning of your device to ensure it is working properly. You are scheduled for a device check from home on 02/28/2018. You may send your transmission at any time that day. If you have a wireless device, the transmission will be sent automatically. After your physician reviews your transmission, you will receive a postcard with your next transmission date.   Any Other Special Instructions Will Be Listed Below (If Applicable).   If you need a refill on your cardiac medications before your next appointment, please call your pharmacy.

## 2017-11-30 LAB — CUP PACEART INCLINIC DEVICE CHECK
Battery Remaining Longevity: 133 mo
Brady Statistic AP VS Percent: 0 %
Brady Statistic AS VP Percent: 0 %
Brady Statistic AS VS Percent: 0 %
Brady Statistic RV Percent Paced: 94.53 %
Implantable Lead Implant Date: 20180905
Implantable Lead Implant Date: 20180905
Implantable Lead Location: 753860
Lead Channel Impedance Value: 323 Ohm
Lead Channel Impedance Value: 418 Ohm
Lead Channel Impedance Value: 627 Ohm
Lead Channel Pacing Threshold Amplitude: 0.375 V
Lead Channel Pacing Threshold Pulse Width: 0.4 ms
Lead Channel Sensing Intrinsic Amplitude: 3.5 mV
Lead Channel Setting Pacing Amplitude: 2.5 V
Lead Channel Setting Sensing Sensitivity: 1.2 mV
MDC IDC LEAD LOCATION: 753859
MDC IDC MSMT BATTERY VOLTAGE: 3.08 V
MDC IDC MSMT LEADCHNL RA SENSING INTR AMPL: 1.375 mV
MDC IDC MSMT LEADCHNL RA SENSING INTR AMPL: 1.5 mV
MDC IDC MSMT LEADCHNL RV IMPEDANCE VALUE: 266 Ohm
MDC IDC MSMT LEADCHNL RV SENSING INTR AMPL: 1.875 mV
MDC IDC PG IMPLANT DT: 20180905
MDC IDC SESS DTM: 20181218202312
MDC IDC SET LEADCHNL RV PACING PULSEWIDTH: 0.4 ms
MDC IDC STAT BRADY AP VP PERCENT: 0 %
MDC IDC STAT BRADY RA PERCENT PACED: 0 %

## 2017-12-03 IMAGING — CT CT CHEST HIGH RESOLUTION W/O CM
2 of 5 series · 15 of 36 positions shown, 18 images · non-contrast
Comparison: 01/13/2012 chest CT.

CLINICAL DATA: Dyspnea on exertion .

EXAM:
CT CHEST WITHOUT CONTRAST
TECHNIQUE: Multidetector CT imaging of the chest was performed following the
standard protocol without intravenous contrast. High resolution
imaging of the lungs, as well as inspiratory and expiratory imaging,
was performed.

[Series 2: high resolution · axial · 0.63mm/px · z∈[-296,-32]mm · 12 of 146 slices shown, 15 images]
[im 7/146  mediastinal]
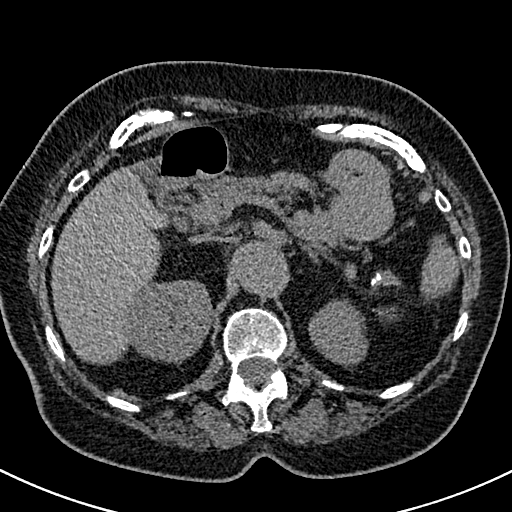
[im 7/146  lung]
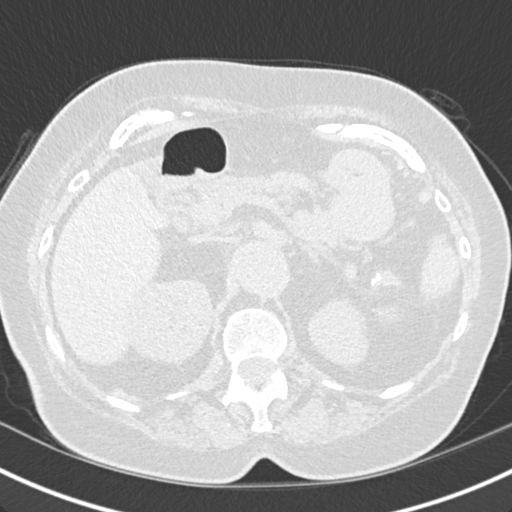
[im 21/146  lung]
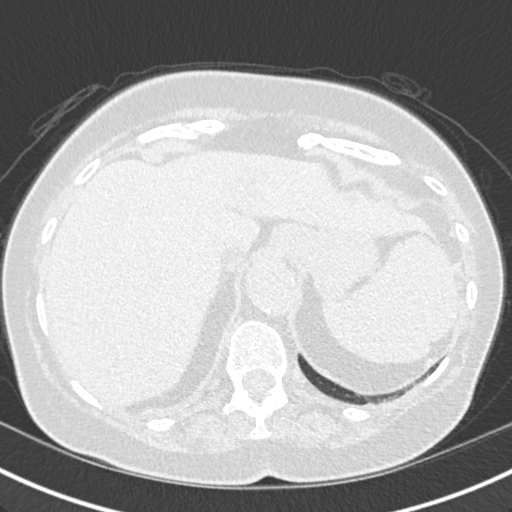
[im 35/146  lung]
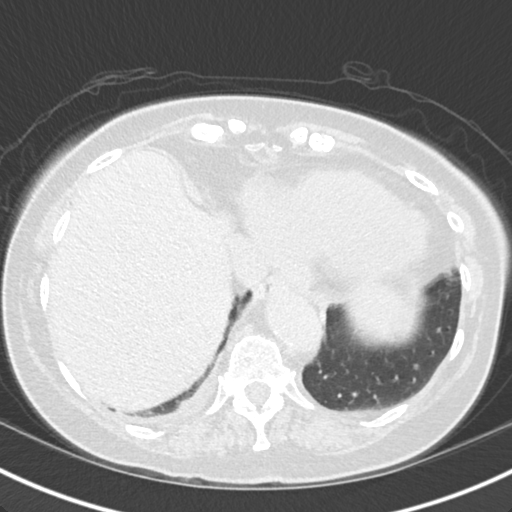
[im 42/146  lung]
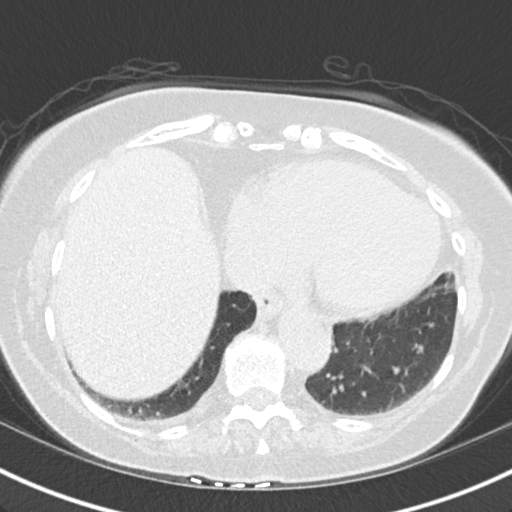
[im 56/146  mediastinal]
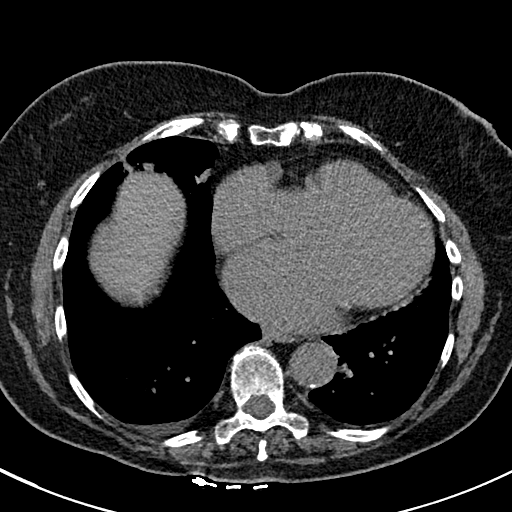
[im 56/146  lung]
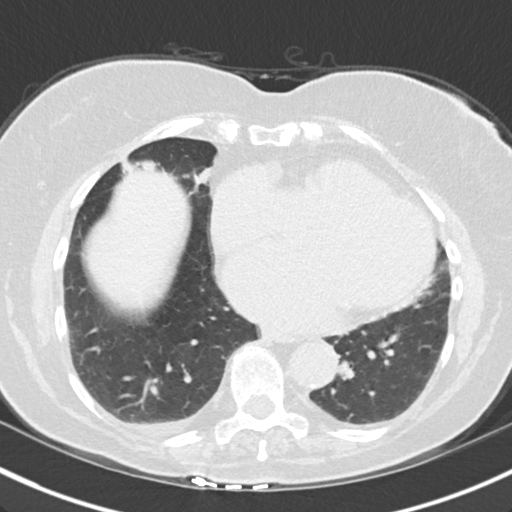
[im 70/146  lung]
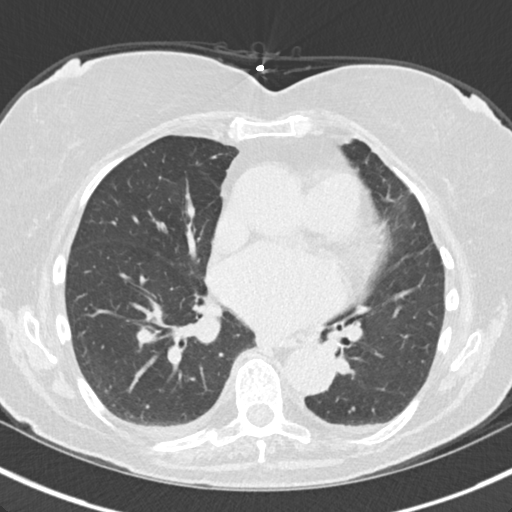
[im 76/146  lung]
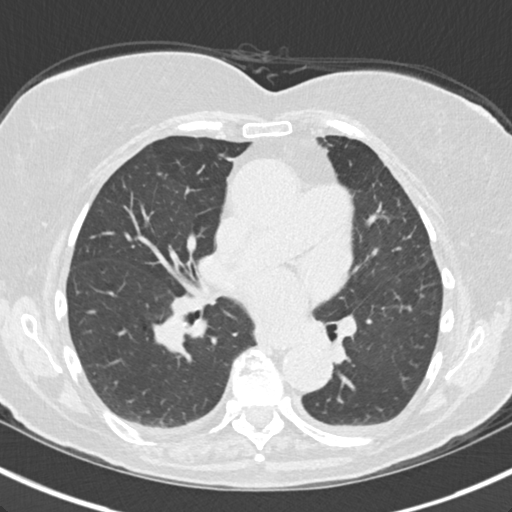
[im 90/146  lung]
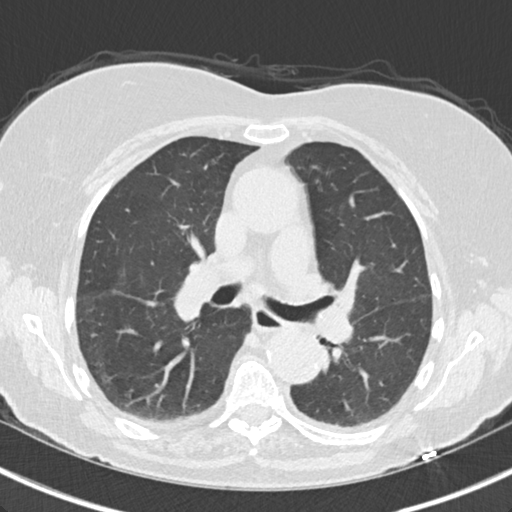
[im 104/146  mediastinal]
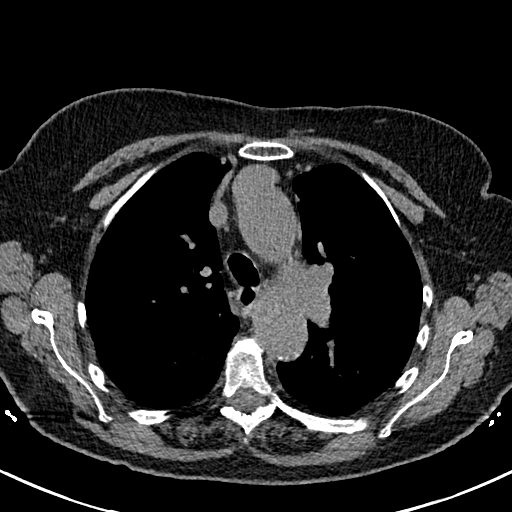
[im 104/146  lung]
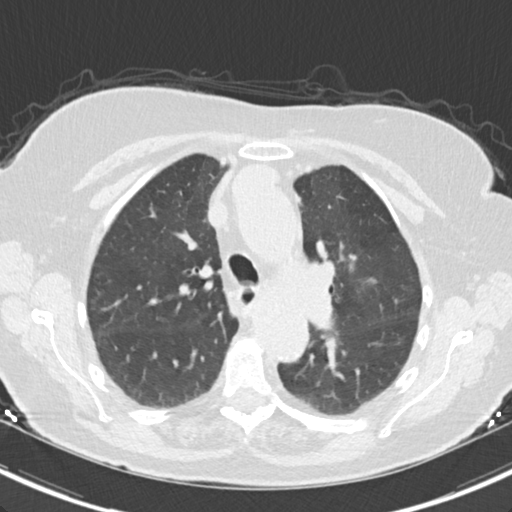
[im 111/146  lung]
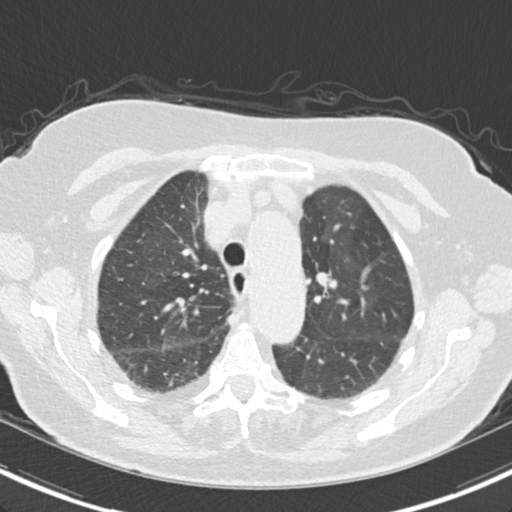
[im 125/146  lung]
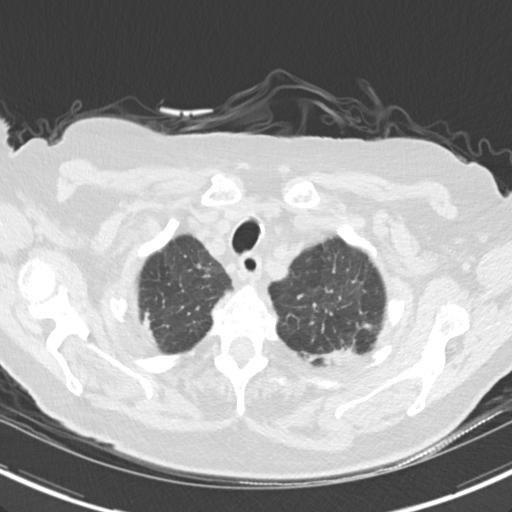
[im 139/146  lung]
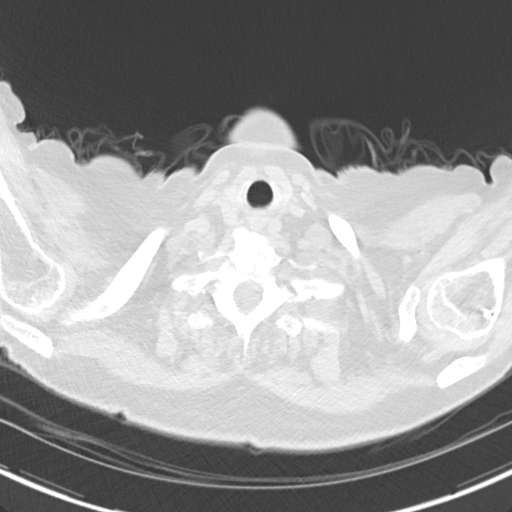

[Series 8: coronal · coronal · 0.59mm/px · 3 of 120 slices shown]
[im 24/120  lung]
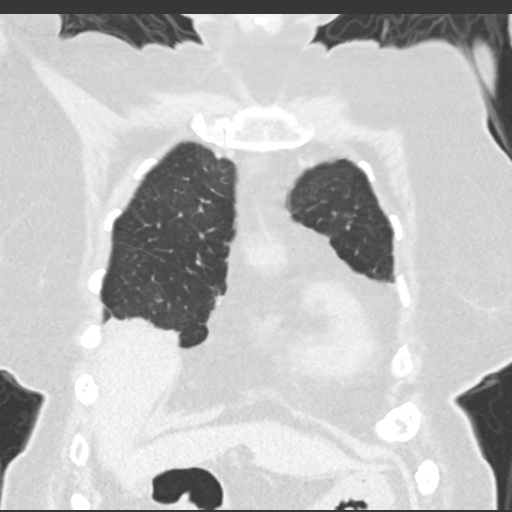
[im 48/120  lung]
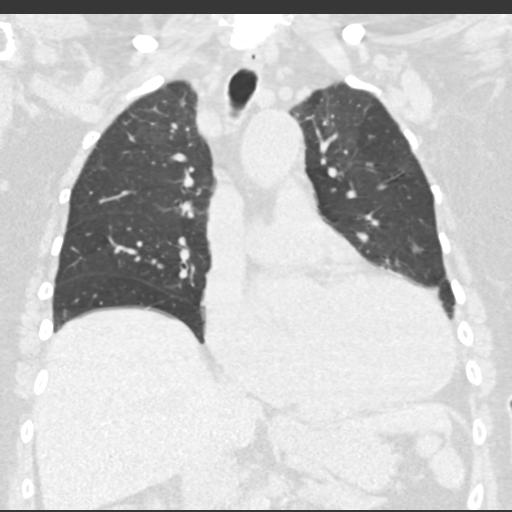
[im 72/120  lung]
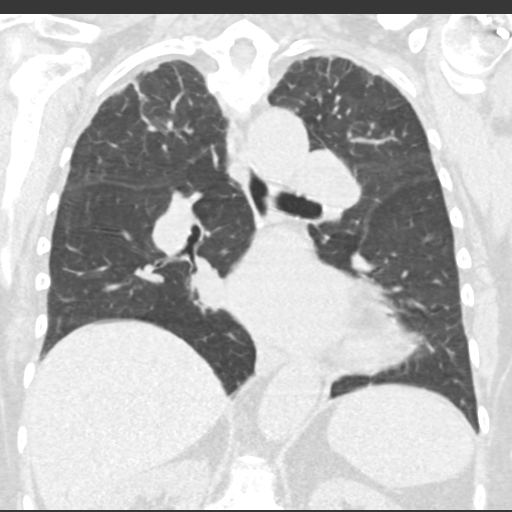

[15 of 36 positions shown; findings below may reference images not displayed]

FINDINGS: Partially motion degraded scan.

Cardiovascular: Mild cardiomegaly. No significant pericardial
fluid/thickening. Mildly atherosclerotic thoracic aorta with 4.3 cm
ascending thoracic aortic aneurysm, increased from 4.0 cm on
01/13/2012. Normal caliber pulmonary arteries.

Mediastinum/Nodes: No discrete thyroid nodules. Unremarkable
esophagus. No axillary adenopathy. Mildly enlarged 1.1 cm right
lower paratracheal node (series 2/ image 54), stable since
01/13/2012. No additional pathologically enlarged mediastinal or
gross hilar nodes on this noncontrast scan.

Lungs/Pleura: No pneumothorax. Trace dependent bilateral pleural
effusions. No acute consolidative airspace disease, lung masses or
significant pulmonary nodules. There is mild patchy subpleural
reticulation and mild heterogeneous ground-glass attenuation
throughout both lungs, without a clear basilar gradient and without
significant traction bronchiectasis or frank honeycombing. These
findings have not convincingly changed back to the 01/13/2012. There
are scattered stable small subpleural bandlike opacities with
associated mild distortion at both lung apices and in the superior
segments of the lower lobes and basilar lingula and right middle
lobe, most compatible with postinfectious/ postinflammatory
scarring. Mild patchy air trapping in both lungs on the expiration
sequence.

Upper abdomen: Unremarkable.

Musculoskeletal: No aggressive appearing focal osseous lesions. Soft
tissue anchors are seen in the posterior left humeral head. Moderate
thoracic spondylosis.
IMPRESSION: 1. Mild patchy subpleural reticulation and mild heterogeneous
ground-glass attenuation throughout both lungs, without a clear
basilar gradient. No significant traction bronchiectasis. No frank
honeycombing. No appreciable interval change since 01/13/2012 chest
CT. Findings may indicate an interstitial lung disease such as
nonspecific interstitial pneumonia (NSIP). Findings are not
compatible with usual interstitial pneumonia (UIP) .
2. Mild patchy air trapping in both lungs, compatible with small
airways disease.
3. Mild cardiomegaly.  Trace dependent bilateral pleural effusions.
4. Mild mediastinal lymphadenopathy is stable since 5403, compatible
with benign reactive adenopathy.
5. Aortic atherosclerosis. Ascending thoracic aortic 4.3 cm
aneurysm, mildly increased. Recommend annual imaging followup by CTA
or MRA. This recommendation follows 6414
ACCF/AHA/AATS/ACR/ASA/SCA/JENNEEL/CHOICE/TRIPTI/JUNEJA Guidelines for the
Diagnosis and Management of Patients with Thoracic Aortic Disease.
Circulation. 6414; 121: [id number].

## 2018-01-20 ENCOUNTER — Telehealth: Payer: Self-pay | Admitting: Internal Medicine

## 2018-01-20 NOTE — Telephone Encounter (Signed)
Order was placed at pt's last OV with MR 05/17/17 for pt to have a HRCT March 2019.  Sherry with [facility] called pt to schedule this and pt stated to her that it was discovered her issue was with her heart and she does not feel like she needs to have a CT or a follow up appt at this time.  Pt stated if she became worse, she would call us to let us know if she decided she needed to be seen by [place] again.  Routing this to MR as an FYI to make him aware.

## 2018-01-25 NOTE — Telephone Encounter (Signed)
I have scheduled pt's CT for 3/1 at [facility].  I spoke to her & gave her appt info.  Nothing further needed.

## 2018-01-25 NOTE — Telephone Encounter (Signed)
Called pt letting her know it was recommended for pt to get the HRCT scan done based on trying to find out if she could possibly have pulmonary fibrosis.  After speaking with pt, she has said we can go ahead and schedule her to have the HRCT done.  Will send this over to [facility] with [facility] letting her know we could go ahead and schedule pt's HRCT.

## 2018-01-25 NOTE — Telephone Encounter (Signed)
The issue is that she might have pulmonary fibrosis on march 2018 CT and that is why I wanted it repeated because even though she might feel stable it might be changing. So, I still recommend HRCT next few mohts  However, it has been stable for 5 years so if she does not feel the need she can do a CT 6-12 months from 01/25/2018   And if she still does not like that option we can go with what she says; come if worse  Dr. Brand Males, M.D., [facility].C.P Pulmonary and Critical Care Medicine Staff Physician, [facility] Director - Interstitial Lung Disease  Program  Pulmonary [place] at [place], [name], [address]  Pager: [phone], If no answer or between  15:00h - 7:00h: call [phone] Telephone: [phone]

## 2018-01-31 ENCOUNTER — Other Ambulatory Visit: Payer: Self-pay | Admitting: Internal Medicine

## 2018-01-31 NOTE — Telephone Encounter (Signed)
Eliquis 5mg  refill request received; pt is 84 yrs old, wt-71.8kg, Crea-0.94 on 09/01/17, last seen by Dr. Lovena Le on 11/29/17; will send in refill to requested pharmacy.

## 2018-02-01 ENCOUNTER — Other Ambulatory Visit: Payer: Self-pay

## 2018-02-01 DIAGNOSIS — I872 Venous insufficiency (chronic) (peripheral): Secondary | ICD-10-CM

## 2018-02-10 ENCOUNTER — Other Ambulatory Visit: Payer: Medicare Other

## 2018-02-10 ENCOUNTER — Ambulatory Visit (INDEPENDENT_AMBULATORY_CARE_PROVIDER_SITE_OTHER)
Admission: RE | Admit: 2018-02-10 | Discharge: 2018-02-10 | Disposition: A | Discharge status: Home / Self Care | Payer: Medicare Other | Source: Ambulatory Visit | Attending: Internal Medicine | Admitting: Internal Medicine

## 2018-02-10 DIAGNOSIS — J849 Interstitial pulmonary disease, unspecified: Secondary | ICD-10-CM

## 2018-02-12 ENCOUNTER — Telehealth: Payer: Self-pay | Admitting: Internal Medicine

## 2018-02-12 NOTE — Telephone Encounter (Signed)
Mild pulm fibrosis +.  Plan - give first available in ILD clinic to visit with me   Dr. Brand Males, M.D., [facility].C.P Pulmonary and Critical Care Medicine Staff Physician, [place] Director - Interstitial Lung Disease  Program  Pulmonary [place] at [place], [name], [address]  Pager: [phone], If no answer or between  15:00h - 7:00h: call [phone] Telephone: [phone]     Ct Chest High Resolution  Result Date: 02/10/2018 CLINICAL DATA:  84 year old female with suspected interstitial lung disease. Follow-up evaluation. EXAM: CT CHEST WITHOUT CONTRAST TECHNIQUE: Multidetector CT imaging of the chest was performed following the standard protocol without intravenous contrast. High resolution imaging of the lungs, as well as inspiratory and expiratory imaging, was performed. COMPARISON:  None. FINDINGS: Cardiovascular: Heart size is mildly enlarged with left atrial dilatation. There is no significant pericardial fluid, thickening or pericardial calcification. There is aortic atherosclerosis, as well as atherosclerosis of the great vessels of the mediastinum and the coronary arteries, including calcified atherosclerotic plaque in the left anterior descending coronary artery. Left-sided pacemaker device in place with lead tips terminating in the right atrial appendage in the proximal aspect of the right ventricle adjacent to the in interventricular septum. Ectasia of the ascending thoracic aorta (4.2 cm in diameter). Mediastinum/Nodes: No pathologically enlarged mediastinal or hilar lymph nodes. Please note that accurate exclusion of hilar adenopathy is limited on noncontrast CT scans. Esophagus is unremarkable in appearance. No axillary lymphadenopathy. Lungs/Pleura: High-resolution images demonstrate a few patchy areas of very mild ground-glass attenuation, septal thickening and thickening of the peribronchovascular  interstitium, including some areas of mild cylindrical bronchiectasis, most evident in the right middle lobe. These findings are unchanged compared to the prior examination 02/17/2017. No honeycombing. No definitive craniocaudal gradient. Inspiratory and expiratory imaging is unremarkable. No acute consolidative airspace disease. No pleural effusions. New linear opacity in the left lower lobe posteriorly, favored to reflect an area of subsegmental atelectasis or scarring. No definite suspicious appearing pulmonary nodules or masses. Tiny calcified granuloma in the right lower lobe. Upper Abdomen: Aortic atherosclerosis. Musculoskeletal: There are no aggressive appearing lytic or blastic lesions noted in the visualized portions of the skeleton. IMPRESSION: 1. There are again some scattered areas of mild pulmonary fibrosis, as detailed above, with a CT pattern that is considered indeterminate for UIP (usual interstitial pneumonia). Findings are again favored to reflect mild nonspecific interstitial pneumonia (NSIP). 2. Cardiomegaly with mild left atrial dilatation. 3. Aortic atherosclerosis, in addition to left anterior descending coronary artery disease. 4. Ectasia of the ascending thoracic aorta which measures 4.2 cm in diameter. Recommend annual imaging followup by CTA or MRA. This recommendation follows 2010 ACCF/AHA/AATS/ACR/ASA/SCA/SCAI/SIR/STS/SVM Guidelines for the Diagnosis and Management of Patients with Thoracic Aortic Disease. Circulation. 2010; 121: L845-X646. Aortic Atherosclerosis (ICD10-I70.0). Electronically Signed   By: Vinnie Langton M.D.   On: 02/10/2018 15:23

## 2018-02-15 NOTE — Telephone Encounter (Signed)
lmtcb x1 for pt. 

## 2018-02-16 ENCOUNTER — Telehealth: Payer: Self-pay | Admitting: Internal Medicine

## 2018-02-16 NOTE — Telephone Encounter (Signed)
Patient aware.

## 2018-02-16 NOTE — Telephone Encounter (Signed)
Spoke with patient, results were given and scheduled for ILD clinic.

## 2018-02-21 ENCOUNTER — Ambulatory Visit: Payer: Medicare Other | Admitting: Internal Medicine

## 2018-02-28 ENCOUNTER — Ambulatory Visit (INDEPENDENT_AMBULATORY_CARE_PROVIDER_SITE_OTHER): Payer: Medicare Other | Admitting: *Deleted

## 2018-02-28 ENCOUNTER — Encounter: Payer: Self-pay | Admitting: Hematology and Oncology

## 2018-02-28 DIAGNOSIS — I481 Persistent atrial fibrillation: Secondary | ICD-10-CM

## 2018-02-28 DIAGNOSIS — I4819 Other persistent atrial fibrillation: Secondary | ICD-10-CM

## 2018-02-28 NOTE — Progress Notes (Signed)
Remote pacemaker transmission.   

## 2018-03-01 ENCOUNTER — Encounter: Payer: Self-pay | Admitting: Cardiology

## 2018-03-07 ENCOUNTER — Encounter: Payer: Self-pay | Admitting: Internal Medicine

## 2018-03-07 ENCOUNTER — Ambulatory Visit: Payer: Medicare Other | Admitting: Internal Medicine

## 2018-03-07 VITALS — BP 138/90 | HR 76 | Ht 69.0 in | Wt 160.4 lb

## 2018-03-07 DIAGNOSIS — J849 Interstitial pulmonary disease, unspecified: Secondary | ICD-10-CM

## 2018-03-07 NOTE — Addendum Note (Signed)
Addended by: Lorretta Harp on: 03/07/2018 03:31 PM   Modules accepted: Orders

## 2018-03-07 NOTE — Progress Notes (Addendum)
Subjective:     Patient ID: [name], female   DOB: 1933-07-30, 84 y.o.   MRN: [id number]  HPI  PPC [name], MD   HPI   IOV 02/11/2017  Chief Complaint  Patient presents with  . Pulmonary Consult    Pt referred by Dr. Crissie Sickles for COPD. Pt c/o DOE. Pt deneis cough and CP/tightness.     84 year old female referred by Dr. Crissie Sickles electrophysiology for evaluation of dyspnea with associated walking desaturation positive test.  She presents with her husband. History is gained from talking to her, came and review and summarization of the referral notes. It appears she has been dyspneic for at least a few months insidious onset. Not necessarily progressive. Moderate in intensity. Present with exertion relieved by rest. Possible associated wheezing but no associated orthopnea paroxysmal nocturnal dyspnea. She has chronic edema that is unchanged. She has chronic osteoarthritis is unchanged. This no associated collagen vascular disease. Review of electrophysiology from 02/08/2017 and summarization shows that she had remote supraventricular tachycardia treated with ablation. Dr. physiology study in February 2018 showed left atrial flutter with RVR and status post direct cardioversion. And when she returns for follow-up with Dr. Lovena Le on 02/08/2017 walking desaturation test was positive and she was referred to pulmonary.  Echocardiogram 01/28/2017 shows left ventricle ejection fraction of 30-35 percent which is a significant drop from 2011 when it was 55-60 percent  Imaging: Last CT scan of the chest 01/13/2012: Personally visualized this was not a high-resolution CT chest. She has biapical mild scarring but otherwise unremarkable  May 2013 swallow study shows gastroesophageal reflux       Walking desat test 185 feet x 3 laps: with forehead probe: DID NOT DESAT below 99%  feno 02/11/2017 - > 34ppd and grey zone   Meds on apixaban and recent lisinopril start feb 2018  (there is hx of mild wheezing toio)    02/21/2017 Follow up : DOE Patient presents for a one-week follow-up. Patient was seen last visit for a pulmonary consult for dyspnea with exertion and exertional hypoxemia noted at a cardiology appointment. Patient is followed for systolic congestive heart failure and A Fib . Echo last month showed significant drop in EF at 30-35% (from EF 60% 2011) . A repeat walk test in the pulmonary office showed no  oxygen desaturations. She was set up for a high resolution CT chest. This showed mild patchy subpleural reticulation and groundglass attenuation throughout both lungs. There was no significant traction bronchiectasis or honeycombing. There was no significant change since 2013 comparison to a chest CT. I went over these results with her and her husband.  She underwent cardioversion last month for A fib /Flutter. Metoprolol was increased and Lisinopril was added.  She says she is feeling much better. Says her dyspnea has decreased . Getting her energy level back.   Never smoker , homemaker . No extensive travel . (Lived in [place] for couple of years in 1960s) . From [place] . No unusal hobbies . No amiodarone or Macrobid use.  No chemo /Radiation. Exposure   .  IMPRESSION: CT chest 02/17/17 1. Mild patchy subpleural reticulation and mild heterogeneous ground-glass attenuation throughout both lungs, without a clear basilar gradient. No significant traction bronchiectasis. No frank honeycombing. No appreciable interval change since 01/13/2012 chest CT. Findings may indicate an interstitial lung disease such as nonspecific interstitial pneumonia (NSIP). Findings are not compatible with usual interstitial pneumonia (UIP) . 2. Mild patchy air trapping in both  lungs, compatible with small airways disease. 3. Mild cardiomegaly.  Trace dependent bilateral pleural effusions. 4. Mild mediastinal lymphadenopathy is stable since 2013, compatible with benign reactive  adenopathy. 5. Aortic atherosclerosis. Ascending thoracic aortic 4.3 cm aneurysm, mildly increased. Recommend annual imaging followup by CTA or MRA. This recommendation follows 2010 ACCF/AHA/AATS/ACR/ASA/SCA/SCAI/SIR/STS/SVM Guidelines for the Diagnosis and Management of Patients with Thoracic Aortic Disease. Circulation. 2010; 121: Z300-T622.   Electronically Signed   By: Ilona Sorrel M.D.   On: 02/17/2017 10:31   OV 05/17/2017  Chief Complaint  Patient presents with  . Follow-up    review PFT.  pt denies any current breathing complaints.     Follow-up dyspnea on exertion in the setting of new onset chronic systolic heart failure. Has a history of desaturation the cardiology office that prompted this referral. At my first intake visit with her in March 2018 her exhaled nitric oxide was slightly high we had a lisinopril stopped. After that she underwent cardioversion for atrial fibrillation. With all this her dyspnea has indeed improved. She did have a high-resolution CT chest in March 2018 that suggested possible presence of ILD. She is followed that up with today with pulmonary function test that suggest restriction with a low DLCO again consistent with ILD. She is a lifelong nonsmoker. In the past she did not desaturate walking in our office. She denies any collagen-vascular symptoms. She does have some crackles on the exam today which are faint though they're not classic for ILD    PV 03/07/2018  Chief Complaint  Patient presents with  . Follow-up    SOB with exertion but overall better   84 year old female with suspected NSIP pattern of interstitial lung disease.  There is a 33-month follow-up.  She is only had minimal changes on her CT scan of the chest and even on exam classic crackles could not be heard although she did have reduced diffusion defect on pulmonary function testing.  In the 9 months since she has had a pacemaker insertion and is doing exercise at the [facility] and her  dyspnea is resolved.  She only has mild dyspnea for climbing an incline.  Her [facility] interstitial lung disease questionnaire shows excellent status of health described below.  She did have of follow-up CT scan of the chest high-resolution and my own personal visualization ILD has been stable for many years right now.  It is indeterminate for UIP. SHe is willing to come for followup but does not hve want PFT or CT unless definitely needed   K-BILD ILD QUESTIONNAIRE, Symptom score over prior 2 weeks  7-none, 6-rarely, 5-occ, 5-some times, 3-sev times, 2-most times, 1-every time 03/07/2018   Dyspnea for stairs, incline or hill 6  Chest Tightness 7  Worry about seriousness of lung complaint 7  Avoided doing things that make you dyspneic 7  Have you felt loss of control of lung condition (reversed from original) 7  Felt fed up due to lung condition 7  Felt urge to breathe aka air hunger 7  Has lung condition made you feel anxious 7  How often have you experienced wheezing or whistling sound 7  How much of the time have you felt your lung dz is getting worse 7  How much has your lung condition interfered with job or daily task 7  Were you expecting your lung condition to get worse 7  How much has your lung function limited you carrying things like groceris 7  How much has your lung function  made you think of EOL? 7  Total   Are you financially worse off 7  Grand Total       Walking desaturation test on 03/07/2018 185 feet x 3 laps on ROOM AIR:  did not, no dyspnea, moderate pace and did not desaturate. Rest pulse ox was 98%, final pulse ox was 99%. HR response 76/min at rest to 91/min at peak exertion. Patient [name]  Did not Desaturate < 88% . [name] did not  Desaturated </= 3% points. [name] yes did get tachyardic    Results for SAMANI, DEAL (MRN [id number]) as of 05/17/2017 12:50  Ref. Range 05/17/2017 11:00  FVC-Pre Latest Units: L 2.00  FVC-%Pred-Pre Latest  Units: % 67  FEV1-Pre Latest Units: L 1.46  FEV1-%Pred-Pre Latest Units: % 66  Pre FEV1/FVC ratio Latest Units: % 73  Results for RUKIYA, HODGKINS (MRN [id number]) as of 05/17/2017 12:50  Ref. Range 05/17/2017 11:00  TLC Latest Units: L 4.31  TLC % pred Latest Units: % 77  Results for LISANNE, PONCE (MRN [id number]) as of 05/17/2017 12:50  Ref. Range 05/17/2017 11:00  DLCO cor Latest Units: ml/min/mmHg 15.99  DLCO cor % pred Latest Units: % 55    IMPRESSION: HRCT  1. There are again some scattered areas of mild pulmonary fibrosis, as detailed above, with a CT pattern that is considered indeterminate for UIP (usual interstitial pneumonia). Findings are again favored to reflect mild nonspecific interstitial pneumonia (NSIP). 2. Cardiomegaly with mild left atrial dilatation. 3. Aortic atherosclerosis, in addition to left anterior descending coronary artery disease. 4. Ectasia of the ascending thoracic aorta which measures 4.2 cm in diameter. Recommend annual imaging followup by CTA or MRA. This recommendation follows 2010 ACCF/AHA/AATS/ACR/ASA/SCA/SCAI/SIR/STS/SVM Guidelines for the Diagnosis and Management of Patients with Thoracic Aortic Disease. Circulation. 2010; 121: L893-T342.  Aortic Atherosclerosis (ICD10-I70.0).   Electronically Signed   By: Vinnie Langton M.D.   On: 02/10/2018 15:23     has a past medical history of Allergic rhinitis, Anemia, Breast cancer, left breast ([place]) (01/15/15), Chronic venous insufficiency, Hematuria, Hypertension, Migraine headache, MVP (mitral valve prolapse), PAF (paroxysmal atrial fibrillation) ([place]), SVT (supraventricular tachycardia) ([place]) (06/2010), and Wears glasses.   reports that she has never smoked. She has never used smokeless tobacco.  Past Surgical History:  Procedure Laterality Date  . A-FLUTTER ABLATION N/A 02/04/2017   Procedure: A-Flutter Ablation;  Surgeon: Evans Lance, MD;  Location: [place] [facility];  Service:  Cardiovascular;  Laterality: N/A;  . ABDOMINAL HYSTERECTOMY    . AV NODE ABLATION N/A 08/17/2017   Procedure: AV Node Ablation;  Surgeon: Evans Lance, MD;  Location: [place] [facility];  Service: Cardiovascular;  Laterality: N/A;  . BREAST LUMPECTOMY WITH RADIOACTIVE SEED LOCALIZATION Left 02/14/2015   Procedure: BREAST LUMPECTOMY WITH RADIOACTIVE SEED LOCALIZATION;  Surgeon: Alphonsa Overall, MD;  Location: [facility];  Service: General;  Laterality: Left;  . BREAST SURGERY  1990   rt br bx  . CARDIAC CATHETERIZATION  2011   ablasion  . CARDIOVERSION N/A 08/10/2017   Procedure: CARDIOVERSION;  Surgeon: Jerline Pain, MD;  Location: [facility];  Service: Cardiovascular;  Laterality: N/A;  . COLONOSCOPY    . DILATION AND CURETTAGE OF UTERUS    . Left Breast Biopsy  01/15/15  . PACEMAKER IMPLANT N/A 08/17/2017   Procedure: Pacemaker Implant;  Surgeon: Evans Lance, MD;  Location: [facility];  Service: Cardiovascular;  Laterality:  N/A;  . TUBAL LIGATION      Allergies  Allergen Reactions  . Adhesive [Tape] Rash    Including EKG electrodes and the like.  . Codeine Other (See Comments)    unknown  . Latex Itching  . Percodan [Oxycodone-Aspirin] Nausea And Vomiting    Nausea     Immunization History  Administered Date(s) Administered  . Influenza Split 10/08/2009, 09/15/2010, 10/28/2011, 10/31/2012, 09/27/2013, 09/17/2015  . Influenza, High Dose Seasonal PF 09/06/2014, 08/31/2017  . Influenza,inj,Quad PF,6+ Mos 09/12/2016  . Pneumococcal Conjugate-13 09/06/2014  . Pneumococcal Polysaccharide-23 09/12/2002, 06/20/2017  . Td 12/13/2005  . Zoster 08/14/2008    Family History  Problem Relation Age of Onset  . Hypertension Mother   . Heart disease Mother   . Arthritis Mother   . Other Father        hardening of the arteries  . Heart Problems Sister        pacemaker  . Heart attack Brother   . Throat cancer Paternal Uncle   . Breast cancer Sister         early 73s  . Alzheimer's disease Sister   . Pulmonary disease Sister   . Breast cancer Daughter 68       Negative genetic testing   . Breast cancer Paternal Aunt        dx over 76  . Cancer Paternal Aunt        cancer in her leg  . Breast cancer Cousin        multiple paternal cousin     Current Outpatient Medications:  .  Carboxymethylcellul-Glycerin (LUBRICATING EYE DROPS OP), Apply 1 drop to eye daily as needed (dry eyes)., Disp: , Rfl:  .  ELIQUIS 5 MG TABS tablet, TAKE 1 TABLET BY MOUTH TWO  TIMES DAILY, Disp: 180 tablet, Rfl: 2 .  levothyroxine (SYNTHROID, LEVOTHROID) 75 MCG tablet, Take 75 mcg by mouth daily before breakfast., Disp: , Rfl:  .  pantoprazole (PROTONIX) 40 MG tablet, Take 40 mg by mouth daily. , Disp: , Rfl:  .  vitamin B-12 (CYANOCOBALAMIN) 100 MCG tablet, Take 100 mcg by mouth daily., Disp: , Rfl:  .  Vitamin D, Ergocalciferol, (DRISDOL) 50000 units CAPS capsule, Take 50,000 Units by mouth every 7 (seven) days., Disp: , Rfl:  .  antiseptic oral rinse (BIOTENE) LIQD, 15 mLs by Mouth Rinse route as needed for dry mouth., Disp: , Rfl:  .  hydrochlorothiazide (HYDRODIURIL) 25 MG tablet, Take 1 tablet (25 mg total) by mouth daily., Disp: 90 tablet, Rfl: 3 .  losartan (COZAAR) 50 MG tablet, Take 1 tablet (50 mg total) by mouth daily., Disp: 90 tablet, Rfl: 3   Review of Systems     Objective:   Physical Exam  Constitutional: She is oriented to person, place, and time. She appears well-developed and well-nourished. No distress.  HENT:  Head: Normocephalic and atraumatic.  Right Ear: External ear normal.  Left Ear: External ear normal.  Mouth/Throat: Oropharynx is clear and moist. No oropharyngeal exudate.  Eyes: Pupils are equal, round, and reactive to light. Conjunctivae and EOM are normal. Right eye exhibits no discharge. Left eye exhibits no discharge. No scleral icterus.  Neck: Normal range of motion. Neck supple. No JVD present. No tracheal deviation  present. No thyromegaly present.  Cardiovascular: Normal rate, regular rhythm, normal heart sounds and intact distal pulses. Exam reveals no gallop and no friction rub.  No murmur heard. Pulmonary/Chest: Effort normal and breath sounds normal. No respiratory distress.  She has no wheezes. She has no rales. She exhibits no tenderness.  Cannot deifnitely hear crackles  Abdominal: Soft. Bowel sounds are normal. She exhibits no distension and no mass. There is no tenderness. There is no rebound and no guarding.  Musculoskeletal: Normal range of motion. She exhibits no edema or tenderness.  Lymphadenopathy:    She has no cervical adenopathy.  Neurological: She is alert and oriented to person, place, and time. She has normal reflexes. No cranial nerve deficit. She exhibits normal muscle tone. Coordination normal.  Skin: Skin is warm and dry. No rash noted. She is not diaphoretic. No erythema. No pallor.  Psychiatric: She has a normal mood and affect. Her behavior is normal. Judgment and thought content normal.  Vitals reviewed.  Vitals:   03/07/18 1440 03/07/18 1441  BP:  138/90  Pulse:  76  SpO2:  98%  Weight: 160 lb 6.4 oz (72.8 kg)   Height: 5\' 9"  (1.753 m)         Assessment:       ICD-10-CM   1. ILD (interstitial lung disease) ([place]) J84.9    Indeterminate pattern for UIP.  Given female gender and age and negative autoimmune profile and benign findings and stability this is likely NSIP.  The future prognosis looks good.  Continue basic supportive care.    Plan:      This is very mild and hardly causing symptoms It has not progressed in many years The CT pattern does not indictae that it will progress Cause not know but susepct a disease called NSIP IN the future, I do not think it will get worse but cannot guarantee that Treatment for this involves prednisone or medicines that have side effects   - given overall good outlook and side effect will just watch  Followup Return to  see me in 12 months or sooner if needed; do simple walk test at follwup - PFT/CT dependent on that    (> 50% of this 15 min visit spent in face to face counseling or/and coordination of care)   Dr. Brand Males, M.D., [facility].C.P Pulmonary and Critical Care Medicine Staff Physician, [place] Director - Interstitial Lung Disease  Program  Pulmonary [place] at [place], [name], [address]  Pager: [phone], If no answer or between  15:00h - 7:00h: call [phone] Telephone: [phone]

## 2018-03-07 NOTE — Patient Instructions (Addendum)
ICD-10-CM   1. ILD (interstitial lung disease) ([place]) J84.9    This is very mild and hardly causing symptoms It has not progressed in many years The CT pattern does not indictae that it will progress Cause not know but susepct a disease called NSIP IN the future, I do not think it will get worse but cannot guarantee that Treatment for this involves prednisone or medicines that have side effects   - given overall good outlook and side effect will just watch  Followup Return to see me in 12 months or sooner if needed

## 2018-03-13 ENCOUNTER — Other Ambulatory Visit: Payer: Self-pay | Admitting: Internal Medicine

## 2018-03-17 LAB — CUP PACEART REMOTE DEVICE CHECK
Battery Remaining Longevity: 127 mo
Battery Voltage: 3.04 V
Brady Statistic RA Percent Paced: 0 %
Date Time Interrogation Session: 20190319125924
Implantable Lead Implant Date: 20180905
Implantable Lead Location: 753860
Implantable Lead Model: 3830
Implantable Lead Model: 5076
Implantable Pulse Generator Implant Date: 20180905
Lead Channel Impedance Value: 266 Ohm
Lead Channel Impedance Value: 342 Ohm
Lead Channel Impedance Value: 589 Ohm
Lead Channel Pacing Threshold Pulse Width: 0.4 ms
Lead Channel Sensing Intrinsic Amplitude: 1.25 mV
Lead Channel Sensing Intrinsic Amplitude: 1.25 mV
Lead Channel Setting Pacing Amplitude: 2.5 V
MDC IDC LEAD IMPLANT DT: 20180905
MDC IDC LEAD LOCATION: 753859
MDC IDC MSMT LEADCHNL RV IMPEDANCE VALUE: 361 Ohm
MDC IDC MSMT LEADCHNL RV PACING THRESHOLD AMPLITUDE: 0.5 V
MDC IDC MSMT LEADCHNL RV SENSING INTR AMPL: 3.125 mV
MDC IDC MSMT LEADCHNL RV SENSING INTR AMPL: 3.125 mV
MDC IDC SET LEADCHNL RV PACING PULSEWIDTH: 0.4 ms
MDC IDC SET LEADCHNL RV SENSING SENSITIVITY: 1.2 mV
MDC IDC STAT BRADY AP VP PERCENT: 0 %
MDC IDC STAT BRADY AP VS PERCENT: 0 %
MDC IDC STAT BRADY AS VP PERCENT: 0 %
MDC IDC STAT BRADY AS VS PERCENT: 0 %
MDC IDC STAT BRADY RV PERCENT PACED: 91.56 %

## 2018-04-04 ENCOUNTER — Ambulatory Visit: Payer: Medicare Other | Admitting: Vascular Surgery

## 2018-04-04 ENCOUNTER — Encounter: Payer: Self-pay | Admitting: Vascular Surgery

## 2018-04-04 ENCOUNTER — Ambulatory Visit (HOSPITAL_COMMUNITY)
Admission: RE | Admit: 2018-04-04 | Discharge: 2018-04-04 | Disposition: A | Discharge status: Home / Self Care | Payer: Medicare Other | Source: Ambulatory Visit | Attending: Vascular Surgery | Admitting: Vascular Surgery

## 2018-04-04 ENCOUNTER — Other Ambulatory Visit: Payer: Self-pay

## 2018-04-04 VITALS — BP 125/81 | HR 71 | Temp 97.7°F | Resp 16 | Ht 69.0 in | Wt 162.0 lb

## 2018-04-04 DIAGNOSIS — R609 Edema, unspecified: Secondary | ICD-10-CM | POA: Insufficient documentation

## 2018-04-04 DIAGNOSIS — I872 Venous insufficiency (chronic) (peripheral): Secondary | ICD-10-CM | POA: Diagnosis not present

## 2018-04-04 NOTE — Progress Notes (Signed)
Vascular and Vein Specialist of [facility]  Patient name: [name] MRN: [id number] DOB: 03/19/1933 Sex: female  REASON FOR CONSULT: Evaluation of lower extremity symptoms with a possible venous hypertension  HPI: [name] is a 84 y.o. female, who is here today with her husband for discussion of lower extremity symptoms.  She mainly describes symptoms of pins-and-needles sensation from her mid calves down into her feet.  Does have a remote history of what sounds like venous stasis ulceration.  Did have some bleeding from 1 of these ulcers but no other bleeding episodes.  No history of DVT.  Does have a significant cardiac history with cardiac arrhythmias requiring pacemaker placement.  No history of peripheral vascular occlusive disease or carotid disease.  She does report seeing a dermatologist with repeated episodes of dermatitis with effective treatment.  She has worn knee-high compression garments and reports some relief in her symptoms with this.  Past Medical History:  Diagnosis Date  . Allergic rhinitis   . Anemia   . Breast cancer, left breast ([place]) 01/15/15   Invasive Mammary  . Chronic venous insufficiency   . Hematuria    negative workup - Dr. Reece Agar  . Hypertension   . Migraine headache   . MVP (mitral valve prolapse)   . PAF (paroxysmal atrial fibrillation) ([place])   . SVT (supraventricular tachycardia) ([place]) 06/2010   s/p AVNRT ablation  . Wears glasses     Family History  Problem Relation Age of Onset  . Hypertension Mother   . Heart disease Mother   . Arthritis Mother   . Other Father        hardening of the arteries  . Heart Problems Sister        pacemaker  . Heart attack Brother   . Throat cancer Paternal Uncle   . Breast cancer Sister        early 25s  . Alzheimer's disease Sister   . Pulmonary disease Sister   . Breast cancer Daughter 9       Negative genetic testing   . Breast cancer Paternal Aunt    dx over 13  . Cancer Paternal Aunt        cancer in her leg  . Breast cancer Cousin        multiple paternal cousin    SOCIAL HISTORY: Social History   Socioeconomic History  . Marital status: Married    Spouse name: Not on file  . Number of children: 3  . Years of education: Not on file  . Highest education level: Not on file  Occupational History  . Not on file  Social Needs  . Financial resource strain: Not on file  . Food insecurity:    Worry: Not on file    Inability: Not on file  . Transportation needs:    Medical: Not on file    Non-medical: Not on file  Tobacco Use  . Smoking status: Never Smoker  . Smokeless tobacco: Never Used  . Tobacco comment: did smoke about 2cigs long time about 54 plus years ago  Substance and Sexual Activity  . Alcohol use: Yes    Alcohol/week: 0.0 oz    Comment: once a week  . Drug use: No  . Sexual activity: Not on file  Lifestyle  . Physical activity:    Days per week: Not on file    Minutes per session: Not on file  . Stress: Not on file  Relationships  .  Social connections:    Talks on phone: Not on file    Gets together: Not on file    Attends religious service: Not on file    Active member of club or organization: Not on file    Attends meetings of clubs or organizations: Not on file    Relationship status: Not on file  . Intimate partner violence:    Fear of current or ex partner: Not on file    Emotionally abused: Not on file    Physically abused: Not on file    Forced sexual activity: Not on file  Other Topics Concern  . Not on file  Social History Narrative   LIves with husband.    Retired.     Allergies  Allergen Reactions  . Adhesive [Tape] Rash    Including EKG electrodes and the like.  . Codeine Other (See Comments)    unknown  . Latex Itching  . Percodan [Oxycodone-Aspirin] Nausea And Vomiting    Nausea     Current Outpatient Medications  Medication Sig Dispense Refill  . antiseptic oral rinse  (BIOTENE) LIQD 15 mLs by Mouth Rinse route as needed for dry mouth.    . Carboxymethylcellul-Glycerin (LUBRICATING EYE DROPS OP) Apply 1 drop to eye daily as needed (dry eyes).    Marland Kitchen ELIQUIS 5 MG TABS tablet TAKE 1 TABLET BY MOUTH TWO  TIMES DAILY 180 tablet 2  . levothyroxine (SYNTHROID, LEVOTHROID) 75 MCG tablet Take 75 mcg by mouth daily before breakfast.    . losartan (COZAAR) 50 MG tablet TAKE 1 TABLET BY MOUTH  DAILY 90 tablet 2  . pantoprazole (PROTONIX) 40 MG tablet Take 40 mg by mouth daily.     . vitamin B-12 (CYANOCOBALAMIN) 100 MCG tablet Take 100 mcg by mouth daily.    . Vitamin D, Ergocalciferol, (DRISDOL) 50000 units CAPS capsule Take 50,000 Units by mouth every 7 (seven) days.    . hydrochlorothiazide (HYDRODIURIL) 25 MG tablet Take 1 tablet (25 mg total) by mouth daily. 90 tablet 3  . losartan (COZAAR) 50 MG tablet Take 1 tablet (50 mg total) by mouth daily. 90 tablet 3   No current facility-administered medications for this visit.     REVIEW OF SYSTEMS:  [X]  denotes positive finding, [ ]  denotes negative finding Cardiac  Comments:  Chest pain or chest pressure:    Shortness of breath upon exertion:    Short of breath when lying flat:    Irregular heart rhythm: x       Vascular    Pain in calf, thigh, or hip brought on by ambulation:    Pain in feet at night that wakes you up from your sleep:     Blood clot in your veins:    Leg swelling:  x       Pulmonary    Oxygen at home:    Productive cough:     Wheezing:         Neurologic    Sudden weakness in arms or legs:     Sudden numbness in arms or legs:     Sudden onset of difficulty speaking or slurred speech:    Temporary loss of vision in one eye:     Problems with dizziness:         Gastrointestinal    Blood in stool:     Vomited blood:         Genitourinary    Burning when urinating:     Blood in  urine:        Psychiatric    Major depression:         Hematologic    Bleeding problems:    Problems  with blood clotting too easily:        Skin    Rashes or ulcers:        Constitutional    Fever or chills:      PHYSICAL EXAM: Vitals:   04/04/18 1603  BP: 125/81  Pulse: 71  Resp: 16  Temp: 97.7 F (36.5 C)  TempSrc: Oral  SpO2: 92%  Weight: 162 lb (73.5 kg)  Height: 5\' 9"  (1.753 m)    GENERAL: The patient is a well-nourished female, in no acute distress. The vital signs are documented above. CARDIOVASCULAR: 2+ radial and 1+ dorsalis pedis pulse bilaterally. PULMONARY: There is good air exchange  ABDOMEN: Soft and non-tender  MUSCULOSKELETAL: There are no major deformities or cyanosis. NEUROLOGIC: No focal weakness or paresthesias are detected. SKIN: There are no ulcers or rashes noted.  Does have scattered superficial telangiectasia which is somewhat more raised above the skin surface.  No ulcerations.  Some hemosiderin deposits diffusely. PSYCHIATRIC: The patient has a normal affect.  DATA:  Noninvasive studies showed no evidence of dilatation of her saphenous vein bilaterally.  She did not have any evidence of superficial or deep reflux on the right.  On the left she did have some reflux in her common femoral vein and great saphenous vein at the groin  MEDICAL ISSUES: I discussed these findings with the patient and her husband.  I do not see any evidence of arterial occlusive disease and no correctable venous hypertension.  She really has very little duplex evidence for reflux.  He has had some benefit from compression garments for encouraged her to continue the use of these.  She was reassured with this discussion will see [place] again on an as-needed basis   Rosetta Posner, MD [facility] Vascular and Vein Specialists of [facility] Tel [phone] Pager [phone]

## 2018-05-12 ENCOUNTER — Telehealth: Payer: Self-pay | Admitting: Cardiology

## 2018-05-12 NOTE — Telephone Encounter (Signed)
Spoke to patient's spouse regarding questions about patient's upcoming remote transmission. Spouse states that patient will be going on a trip during the time that her remote is scheduled. Spouse wanted to know if patient could take the monitor with her on vacation. I told him that she could take the monitor with her and plug it up near her bed, and the transmission should come through automatically. I told spouse that we would call if the transmission is not received and instruct them on how to send a manual transmission. Spouse verbalized understanding.

## 2018-05-12 NOTE — Telephone Encounter (Signed)
New Message:    Please call,pt has a question.

## 2018-05-30 ENCOUNTER — Ambulatory Visit (INDEPENDENT_AMBULATORY_CARE_PROVIDER_SITE_OTHER): Payer: Medicare Other | Admitting: *Deleted

## 2018-05-30 DIAGNOSIS — I4819 Other persistent atrial fibrillation: Secondary | ICD-10-CM

## 2018-05-30 DIAGNOSIS — I481 Persistent atrial fibrillation: Secondary | ICD-10-CM | POA: Diagnosis not present

## 2018-05-30 NOTE — Progress Notes (Signed)
Remote pacemaker transmission.   

## 2018-06-02 LAB — CUP PACEART REMOTE DEVICE CHECK
Battery Remaining Longevity: 124 mo
Brady Statistic AP VP Percent: 0 %
Brady Statistic AP VS Percent: 0 %
Brady Statistic AS VP Percent: 0 %
Brady Statistic AS VS Percent: 0 %
Brady Statistic RV Percent Paced: 98.79 %
Implantable Lead Implant Date: 20180905
Implantable Lead Location: 753859
Implantable Lead Location: 753860
Implantable Lead Model: 3830
Lead Channel Impedance Value: 342 Ohm
Lead Channel Impedance Value: 361 Ohm
Lead Channel Impedance Value: 589 Ohm
Lead Channel Pacing Threshold Amplitude: 0.5 V
Lead Channel Sensing Intrinsic Amplitude: 1.625 mV
Lead Channel Sensing Intrinsic Amplitude: 1.625 mV
Lead Channel Setting Pacing Pulse Width: 0.4 ms
Lead Channel Setting Sensing Sensitivity: 1.2 mV
MDC IDC LEAD IMPLANT DT: 20180905
MDC IDC MSMT BATTERY VOLTAGE: 3.02 V
MDC IDC MSMT LEADCHNL RA SENSING INTR AMPL: 4 mV
MDC IDC MSMT LEADCHNL RA SENSING INTR AMPL: 4 mV
MDC IDC MSMT LEADCHNL RV IMPEDANCE VALUE: 285 Ohm
MDC IDC MSMT LEADCHNL RV PACING THRESHOLD PULSEWIDTH: 0.4 ms
MDC IDC PG IMPLANT DT: 20180905
MDC IDC SESS DTM: 20190618054444
MDC IDC SET LEADCHNL RV PACING AMPLITUDE: 2.5 V
MDC IDC STAT BRADY RA PERCENT PACED: 0 %

## 2018-08-03 ENCOUNTER — Ambulatory Visit: Payer: Medicare Other | Admitting: Hematology and Oncology

## 2018-08-04 ENCOUNTER — Ambulatory Visit (INDEPENDENT_AMBULATORY_CARE_PROVIDER_SITE_OTHER): Payer: Medicare Other

## 2018-08-04 ENCOUNTER — Encounter: Payer: Self-pay | Admitting: Podiatry

## 2018-08-04 ENCOUNTER — Ambulatory Visit: Payer: Medicare Other | Admitting: Podiatry

## 2018-08-04 VITALS — BP 123/85 | HR 74 | Resp 16

## 2018-08-04 DIAGNOSIS — M2012 Hallux valgus (acquired), left foot: Secondary | ICD-10-CM

## 2018-08-04 DIAGNOSIS — M2011 Hallux valgus (acquired), right foot: Secondary | ICD-10-CM | POA: Diagnosis not present

## 2018-08-04 NOTE — Patient Instructions (Signed)

## 2018-08-04 NOTE — Progress Notes (Signed)
   Subjective:    Patient ID: [name], female    DOB: 1933-05-27, 84 y.o.   MRN: [id number]  HPI    Review of Systems  All other systems reviewed and are negative.      Objective:   Physical Exam        Assessment & Plan:

## 2018-08-04 NOTE — Progress Notes (Signed)
Subjective:   Patient ID: [name], female   DOB: 84 y.o.   MRN: [id number]   HPI Patient presents with a left painful bunion that she is trying wider shoes and she is try soaks without relief and is gradually becoming more painful and she would like to be able to get this fixed if possible   Review of Systems  All other systems reviewed and are negative.       Objective:  Physical Exam  Constitutional: She appears well-developed and well-nourished.  Cardiovascular: Intact distal pulses.  Pulmonary/Chest: Effort normal.  Musculoskeletal: Normal range of motion.  Neurological: She is alert.  Skin: Skin is warm.  Nursing note and vitals reviewed.   Neurovascular status was found to be intact with patient found to have normal muscle strength for her age and normal joint motion.  She has hyperostosis medial aspect first metatarsal head left with redness around the first metatarsal head with pain when palpated with mild varicosities in the ankle but no indication of venous disease as far as deep system goes.  She does have A. fib and is on Eliquis currently and does have good digital perfusion well oriented x3     Assessment:  Structural bunion deformity with pain around the first metatarsal head left with making shoe gear increasingly difficult     Plan:  H&P condition reviewed and I do think we could do a very simple bunion procedure and just take the medial eminence off and this would give her relief of her symptoms and very quick recovery.  I am going to send a note to her cardiologist for evaluation and I would like her to be off the Eliquis if possible but I will defer to him as to whether that can be done.  Reappoint 2 weeks  X-ray indicates significant structural bunion deformity left over right with abnormal positioning of the first metatarsal head

## 2018-08-08 ENCOUNTER — Inpatient Hospital Stay: Payer: Medicare Other | Attending: Hematology and Oncology | Admitting: Hematology and Oncology

## 2018-08-08 ENCOUNTER — Telehealth: Payer: Self-pay | Admitting: Hematology and Oncology

## 2018-08-08 DIAGNOSIS — Z79811 Long term (current) use of aromatase inhibitors: Secondary | ICD-10-CM | POA: Insufficient documentation

## 2018-08-08 DIAGNOSIS — Z170 Estrogen receptor positive status [ER+]: Secondary | ICD-10-CM

## 2018-08-08 DIAGNOSIS — Z7901 Long term (current) use of anticoagulants: Secondary | ICD-10-CM | POA: Insufficient documentation

## 2018-08-08 DIAGNOSIS — C50512 Malignant neoplasm of lower-outer quadrant of left female breast: Secondary | ICD-10-CM | POA: Insufficient documentation

## 2018-08-08 MED ORDER — RANITIDINE HCL 75 MG PO TABS: 75.0000 mg | ORAL_TABLET | Freq: Two times a day (BID) | ORAL | Status: DC

## 2018-08-08 NOTE — Progress Notes (Signed)
Patient Care Team: Lajean Manes, MD as PCP - General (Internal Medicine) Nicholas Lose, MD as Consulting Physician (Hematology and Oncology) Sylvan Cheese, NP as Nurse Practitioner (Nurse Practitioner) Evans Lance, MD as Consulting Physician (Cardiology) Brand Males, MD as Consulting Physician (Pulmonary Disease)  DIAGNOSIS:  Encounter Diagnosis  Name Primary?  . Malignant neoplasm of lower-outer quadrant of left breast of female, estrogen receptor positive ([place])     SUMMARY OF ONCOLOGIC HISTORY:   Breast cancer of lower-outer quadrant of left female breast ([place])   01/15/2015 Initial Biopsy    Left breast biopsy: Invasive lobular cancer, grade 2, ER+ (94%), PR+ (29%),  HER-2 negative, Ki-67 19%.    01/21/2015 Breast MRI    Left breast:: 1.7 x 1.6 x 1.4 cm irregular enhancing mass lower outer quadrant, no abnormal lymph nodes    01/21/2015 Clinical Stage    Stage IA: TI cN0 M0.    02/14/2015 Surgery    Left breast lumpectomy ([facility]): Invasive lobular cancer, negative for LV I, tumor size 01.7 cm, margins negative, HER-2 was repeated and it was negative    02/14/2015 Pathologic Stage    Stage IA: T1c N0    02/26/2015 Miscellaneous    Pt opted for no RT.    03/12/2015 Procedure    Genetic testing: OvaNext gene panel  ([facility]) revealed VUS at ATM. Otherwise negative BARD1, BRCA1, BRCA2, BRIP1, CDH1, CHEK2, EPCAM, MLH1, MRE11A, MSH2, MSH6, MUTYH, NBN, NF1, PALB2, PMS2, PTEN, RAD50, RAD51C, RAD51D, SMARCA4, STK11, TP53.    04/29/2015 - 02/09/2016 Anti-estrogen oral therapy    Anastrozole 1 mg daily changed to letrozole 2.5 mg daily 09/19/2015 due to dry mouth and loss of taste, stopped due to continuation of same side effects    08/22/2015 Survivorship    A copy of the survivorship care plan was mailed to the patient in lieu of an in-person visit at her request.     CHIEF COMPLIANT: Follow-up in surveillance of breast cancer  INTERVAL HISTORY: [name] is a 84 year old with above-mentioned history of left breast cancer treated with lumpectomy and she could not tolerate antiestrogen therapy with either anastrozole or letrozole.  She found out that both her daughters have breast cancers and they had genetic testing done which was apparently negative.  She denies any pain or discomfort in the breast.  Denies any lumps or nodules.  REVIEW OF SYSTEMS:   Constitutional: Denies fevers, chills or abnormal weight loss Eyes: Denies blurriness of vision Ears, nose, mouth, throat, and face: Denies mucositis or sore throat Respiratory: Denies cough, dyspnea or wheezes Cardiovascular: Denies palpitation, chest discomfort Gastrointestinal:  Denies nausea, heartburn or change in bowel habits Skin: Denies abnormal skin rashes Lymphatics: Denies new lymphadenopathy or easy bruising Neurological:Denies numbness, tingling or new weaknesses Behavioral/Psych: Mood is stable, no new changes  Extremities: No lower extremity edema Breast:  denies any pain or lumps or nodules in either breasts All other systems were reviewed with the patient and are negative.  I have reviewed the past medical history, past surgical history, social history and family history with the patient and they are unchanged from previous note.  ALLERGIES:  is allergic to adhesive [tape]; codeine; latex; and percodan [oxycodone-aspirin].  MEDICATIONS:  Current Outpatient Medications  Medication Sig Dispense Refill  . antiseptic oral rinse (BIOTENE) LIQD 15 mLs by Mouth Rinse route as needed for dry mouth.    . Carboxymethylcellul-Glycerin (LUBRICATING EYE DROPS OP) Apply 1 drop to eye daily as needed (dry  eyes).    Marland Kitchen ELIQUIS 5 MG TABS tablet TAKE 1 TABLET BY MOUTH TWO  TIMES DAILY 180 tablet 2  . hydrochlorothiazide (HYDRODIURIL) 25 MG tablet Take 1 tablet (25 mg total) by mouth daily. 90 tablet 3  . levothyroxine (SYNTHROID, LEVOTHROID) 75 MCG tablet Take 75 mcg by mouth daily  before breakfast.    . levothyroxine (SYNTHROID, LEVOTHROID) 88 MCG tablet     . losartan (COZAAR) 50 MG tablet TAKE 1 TABLET BY MOUTH  DAILY 90 tablet 2  . ranitidine (ZANTAC 75) 75 MG tablet Take 1 tablet (75 mg total) by mouth 2 (two) times daily.    . vitamin B-12 (CYANOCOBALAMIN) 100 MCG tablet Take 100 mcg by mouth daily.    . Vitamin D, Ergocalciferol, (DRISDOL) 50000 units CAPS capsule Take 50,000 Units by mouth every 7 (seven) days.     No current facility-administered medications for this visit.     PHYSICAL EXAMINATION: ECOG PERFORMANCE STATUS: 1 - Symptomatic but completely ambulatory  Vitals:   08/08/18 0856  BP: (!) 146/87  Pulse: 89  Resp: 18  Temp: 98.4 F (36.9 C)  SpO2: 99%   Filed Weights   08/08/18 0856  Weight: 159 lb 14.4 oz (72.5 kg)    GENERAL:alert, no distress and comfortable SKIN: skin color, texture, turgor are normal, no rashes or significant lesions EYES: normal, Conjunctiva are pink and non-injected, sclera clear OROPHARYNX:no exudate, no erythema and lips, buccal mucosa, and tongue normal  NECK: supple, thyroid normal size, non-tender, without nodularity LYMPH:  no palpable lymphadenopathy in the cervical, axillary or inguinal LUNGS: clear to auscultation and percussion with normal breathing effort HEART: regular rate & rhythm and no murmurs and no lower extremity edema ABDOMEN:abdomen soft, non-tender and normal bowel sounds MUSCULOSKELETAL:no cyanosis of digits and no clubbing  NEURO: alert & oriented x 3 with fluent speech, no focal motor/sensory deficits EXTREMITIES: No lower extremity edema   LABORATORY DATA:  I have reviewed the data as listed CMP Latest Ref Rng & Units 09/01/2017 08/16/2017 08/15/2017  Glucose 65 - 99 mg/dL 85 105(H) 113(H)  BUN 8 - 27 mg/dL 18 20 22(H)  Creatinine 0.57 - 1.00 mg/dL 0.94 0.90 0.86  Sodium 134 - 144 mmol/L 142 137 137  Potassium 3.5 - 5.2 mmol/L 3.7 4.8 4.5  Chloride 96 - 106 mmol/L 102 105 104  CO2 20  - 29 mmol/L '24 27 26  '$ Calcium 8.7 - 10.3 mg/dL 9.3 8.8(L) 8.9  Total Protein 6.4 - 8.3 g/dL - - -  Total Bilirubin 0.20 - 1.20 mg/dL - - -  Alkaline Phos 40 - 150 U/L - - -  AST 5 - 34 U/L - - -  ALT 0 - 55 U/L - - -    Lab Results  Component Value Date   WBC 5.2 08/11/2017   HGB 11.6 (L) 08/11/2017   HCT 36.5 08/11/2017   MCV 91.3 08/11/2017   PLT 197 08/11/2017   NEUTROABS 3.6 08/11/2017    ASSESSMENT & PLAN:  Breast cancer of lower-outer quadrant of left female breast Left breast biopsy: Invasive lobular cancer, grade 2, ER 94%, PR 29%, Ki-67 19%, HER-2 negative, 1.7 x 1.6 x 1.4 cm by MRI on 01/21/2015, clinical stage TI cN0 M0 stage IA. Left lumpectomy 02/14/2015: 1.7 cm invasive lobular cancer, grade 1, ER 94%, PA 29%, Ki-67 19%, HER-2 negative, T1 cN0 M0 stage IA pathologic staging Current treatment: Anastrozole 1 mg daily started 04/27/2015 stopped August 2016 due to dry mouth and  lack of taste; change in treatment to letrozole 2.5 mg daily from 09/21/2015 stopped in February 2017  Breast Cancer Surveillance: 1. Breast exam  08/08/2018 is without any clinical concerns for disease recurrence 2. Mammograms: I do not have any copies of recent mammograms  Patient has 2 daughters with breast cancer.  She tells me that they were tested for genetics and were negative. CT chest 02/10/2018 for interstitial lung disease: Scattered areas of pulmonary fibrosis, cardiomegaly  Return to clinic in 1 year for follow-up  No orders of the defined types were placed in this encounter.  The patient has a good understanding of the overall plan. she agrees with it. she will call with any problems that may develop before the next visit here.   Harriette Ohara, MD 08/08/18

## 2018-08-08 NOTE — Telephone Encounter (Signed)
Gave Pt avs and calendar  °

## 2018-08-08 NOTE — Assessment & Plan Note (Signed)
Left breast biopsy: Invasive lobular cancer, grade 2, ER 94%, PR 29%, Ki-67 19%, HER-2 negative, 1.7 x 1.6 x 1.4 cm by MRI on 01/21/2015, clinical stage TI cN0 M0 stage IA. Left lumpectomy 02/14/2015: 1.7 cm invasive lobular cancer, grade 1, ER 94%, PA 29%, Ki-67 19%, HER-2 negative, T1 cN0 M0 stage IA pathologic staging Current treatment: Anastrozole 1 mg daily started 04/27/2015 stopped August 2016 due to dry mouth and lack of taste; change in treatment to letrozole 2.5 mg daily from 09/21/2015  Letrozole toxicities: 1. Dry mouth and lack of taste  The symptoms have persisted in spite of switching medications.  Breast Cancer Surveillance: 1. Breast exam  08/08/2018 is without any clinical concerns for disease recurrence 2. Mammograms: I do not have any copies of recent mammograms  CT chest 02/10/2018 for interstitial lung disease: Scattered areas of pulmonary fibrosis, cardiomegaly  Return to clinic in 1 year for follow-up

## 2018-08-15 ENCOUNTER — Encounter: Payer: Self-pay | Admitting: *Deleted

## 2018-08-15 NOTE — Progress Notes (Signed)
I sent a medical clearance letter for surgery to Drs. Stoneking, [employer] per Dr. Paulla Dolly.

## 2018-08-17 ENCOUNTER — Telehealth: Payer: Self-pay | Admitting: Podiatry

## 2018-08-17 ENCOUNTER — Telehealth: Payer: Self-pay | Admitting: Internal Medicine

## 2018-08-17 NOTE — Telephone Encounter (Signed)
I'm calling because Dr. Lovena Le has not received a request from Dr. Paulla Dolly about the eliquis, being off of eliquis for surgery. Please call me back because I have an appointment with Dr. Paulla Dolly tomorrow. [phone]. Thank you.

## 2018-08-17 NOTE — Telephone Encounter (Signed)
Follow Up:   Patient calling concerning a clearance Please call back

## 2018-08-18 ENCOUNTER — Ambulatory Visit: Payer: Medicare Other | Admitting: Podiatry

## 2018-08-18 ENCOUNTER — Telehealth: Payer: Self-pay | Admitting: *Deleted

## 2018-08-18 ENCOUNTER — Encounter: Payer: Self-pay | Admitting: Podiatry

## 2018-08-18 ENCOUNTER — Telehealth: Payer: Self-pay | Admitting: Internal Medicine

## 2018-08-18 DIAGNOSIS — M2011 Hallux valgus (acquired), right foot: Secondary | ICD-10-CM | POA: Diagnosis not present

## 2018-08-18 DIAGNOSIS — M2012 Hallux valgus (acquired), left foot: Secondary | ICD-10-CM

## 2018-08-18 NOTE — Telephone Encounter (Signed)
I called Dr. Tanna Furry office and spoke to [place].  I asked her about medical clearance for [facility].  She stated they had not received any forms requesting medical clearance.  I informed her that we sent a medical clearance letter to Dr. Lovena Le via Epic in-basket.  She stated they would not have known to look there, that's not their protocol.  She said normally the request is faxed or they receive it by a call.  She said she would get the letter to the team that handles it and they will contact us with a response.

## 2018-08-18 NOTE — Telephone Encounter (Signed)
-----   Message from Wallene Huh, DPM sent at 08/04/2018  1:03 PM EDT ----- I evaluated Madalina today for painful bunion left foot. I do think she would benefit from simple bunion correction which would allow her to wt. Bear immediately after surgery. I would like to take her off her eliquis for several days preop if you feel appropriate. This will be done under iv sedation and I also want to make sure there is no cardiac concerns for anesthesia. Thanks, Norm

## 2018-08-18 NOTE — Telephone Encounter (Signed)
   Primary Cardiologist:Gregg Lovena Le, MD (last seen 11/2017)  Chart reviewed as part of pre-operative protocol coverage. Because of [name] past medical history and time since last visit, he/she will require a follow-up visit in order to better assess preoperative cardiovascular risk.  Pre-op covering staff: - Please schedule appointment and call patient to inform them. - Please contact requesting surgeon's office via preferred method (i.e, phone, fax) to inform them of need for appointment prior to surgery.  [place], [place]  08/18/2018, 8:44 AM

## 2018-08-18 NOTE — Progress Notes (Signed)
Subjective:   Patient ID: [name], female   DOB: 84 y.o.   MRN: [id number]   HPI Patient presents stating she has bunion deformity left that she really wants corrected and her nail right she wanted checked.  We are still waiting for response from her doctor concerning correction and stopping her Xarelto for a number of days prior to procedure   ROS      Objective:  Physical Exam  Neurovascular status unchanged with patient found to have a painful red structural bunion deformity left and a irritated right hallux nail localized in nature with patient found to have good digital perfusion well oriented x3     Assessment:  Structural HAV deformity left along with nail disease right hallux     Plan:  H&P condition reviewed and recommended removal of the prominence of the first metatarsal head left and I explained to her and her husband the procedure and showed them on x-ray.  I did allow them to go over consent form going over alternative treatments and complications and they are willing to accept the risk and want to have surgery done and will tentatively be scheduled for 3 weeks and we have sent notes to Dr. Lovena Le concerning approval from a cardiac standpoint.  Do not recommend treatment of the right and I did explain she will not get complete correction of the symptoms on the left but I do think it will alleviate the pain she is experiencing

## 2018-08-18 NOTE — Telephone Encounter (Signed)
New Message      [facility] Pre-operative Risk Assessment    Request for surgical clearance:  1. What type of surgery is being performed? [name]  2. When is this surgery scheduled? TBD  3. What type of clearance is required (medical clearance vs. Pharmacy clearance to hold med vs. Both)? Both  4. Are there any medications that need to be held prior to surgery and how long? Eliquis, please advise length  5. Practice name and name of physician performing surgery? [facility] [place]. What is your office phone number [phone]    7.   What is your office fax number [phone]  8.   Anesthesia type (None, local, MAC, general) ? Local IV sedation   [name] 08/18/2018, 8:14 AM  _________________________________________________________________   (provider comments below)

## 2018-08-18 NOTE — Telephone Encounter (Signed)
See separate encounter

## 2018-08-18 NOTE — Patient Instructions (Signed)
Pre-Operative Instructions  Congratulations, you have decided to take an important step towards improving your quality of life.  You can be assured that the doctors and staff at [facility] will be with you every step of the way.  Here are some important things you should know:  1. Plan to be at the surgery center/hospital at least 1 (one) hour prior to your scheduled time, unless otherwise directed by the surgical center/hospital staff.  You must have a responsible adult accompany you, remain during the surgery and drive you home.  Make sure you have directions to the surgical center/hospital to ensure you arrive on time. 2. If you are having surgery at [facility] or [facility] hospitals, you will need a copy of your medical history and physical form from your family physician within one month prior to the date of surgery. We will give you a form for your primary physician to complete.  3. We make every effort to accommodate the date you request for surgery.  However, there are times where surgery dates or times have to be moved.  We will contact you as soon as possible if a change in schedule is required.   4. No aspirin/ibuprofen for one week before surgery.  If you are on aspirin, any non-steroidal anti-inflammatory medications (Mobic, Aleve, Ibuprofen) should not be taken seven (7) days prior to your surgery.  You make take Tylenol for pain prior to surgery.  5. Medications - If you are taking daily heart and blood pressure medications, seizure, reflux, allergy, asthma, anxiety, pain or diabetes medications, make sure you notify the surgery center/hospital before the day of surgery so they can tell you which medications you should take or avoid the day of surgery. 6. No food or drink after midnight the night before surgery unless directed otherwise by surgical center/hospital staff. 7. No alcoholic beverages 24-hours prior to surgery.  No smoking 24-hours prior or 24-hours after  surgery. 8. Wear loose pants or shorts. They should be loose enough to fit over bandages, boots, and casts. 9. Don't wear slip-on shoes. Sneakers are preferred. 10. Bring your boot with you to the surgery center/hospital.  Also bring crutches or a walker if your physician has prescribed it for you.  If you do not have this equipment, it will be provided for you after surgery. 11. If you have not been contacted by the surgery center/hospital by the day before your surgery, call to confirm the date and time of your surgery. 12. Leave-time from work may vary depending on the type of surgery you have.  Appropriate arrangements should be made prior to surgery with your employer. 13. Prescriptions will be provided immediately following surgery by your doctor.  Fill these as soon as possible after surgery and take the medication as directed. Pain medications will not be refilled on weekends and must be approved by the doctor. 14. Remove nail polish on the operative foot and avoid getting pedicures prior to surgery. 15. Wash the night before surgery.  The night before surgery wash the foot and leg well with water and the antibacterial soap provided. Be sure to pay special attention to beneath the toenails and in between the toes.  Wash for at least three (3) minutes. Rinse thoroughly with water and dry well with a towel.  Perform this wash unless told not to do so by your physician.  Enclosed: 1 Ice pack (please put in freezer the night before surgery)   1 Hibiclens skin cleaner     Pre-op instructions  If you have any questions regarding the instructions, please do not hesitate to call our office.  [place]: [address], , [place] [address] -- [phone]  [place]: [address] [place], [place] [address] -- [phone]  [place]: [address]  [place], [place] [address] -- [phone]  [place]: [address], [address], [place], [place] [address] -- [phone]  Website:  [email or web address] 

## 2018-08-18 NOTE — Telephone Encounter (Signed)
Spoke with patient and she stated that she will have the surgery on October 1st.  Made patient aware of appointment with Ellen Henri on 9/18 @ 11:30 am. Pt verbalized understanding and thanked me for the call.  Will fax over recommendations to surgeon's office via Epic and manual.

## 2018-08-25 ENCOUNTER — Ambulatory Visit: Payer: Medicare Other | Admitting: Podiatry

## 2018-08-29 ENCOUNTER — Ambulatory Visit (INDEPENDENT_AMBULATORY_CARE_PROVIDER_SITE_OTHER): Payer: Medicare Other | Admitting: *Deleted

## 2018-08-29 DIAGNOSIS — I482 Chronic atrial fibrillation: Secondary | ICD-10-CM | POA: Diagnosis not present

## 2018-08-29 DIAGNOSIS — I4821 Permanent atrial fibrillation: Secondary | ICD-10-CM

## 2018-08-29 NOTE — Progress Notes (Signed)
Remote pacemaker transmission.   

## 2018-08-30 ENCOUNTER — Ambulatory Visit: Payer: Medicare Other | Admitting: Cardiology

## 2018-08-30 ENCOUNTER — Encounter: Payer: Self-pay | Admitting: Cardiology

## 2018-08-30 VITALS — BP 124/70 | HR 78 | Ht 69.0 in | Wt 161.0 lb

## 2018-08-30 DIAGNOSIS — Z01818 Encounter for other preprocedural examination: Secondary | ICD-10-CM | POA: Diagnosis not present

## 2018-08-30 DIAGNOSIS — I482 Chronic atrial fibrillation: Secondary | ICD-10-CM

## 2018-08-30 DIAGNOSIS — I4821 Permanent atrial fibrillation: Secondary | ICD-10-CM

## 2018-08-30 NOTE — Progress Notes (Signed)
08/30/2018 [name]   [birth year]  [id number]  Primary Physician Lajean Manes, MD Primary Cardiologist: Dr. Lovena Le   Reason for Visit/CC: Preoperative Cardiovascular Evaluation   HPI:  [name] is a 84 y.o. female  with a history of SVT s/p AVNRT ablation, HTN, MVP, PAF, PPM and chronic venous insufficiency who presents today cardiovascular evaluation/ clearance prior to undergoing low risk orthopedic surgery, Bunionectomy. She is followed by Dr. Lovena Le and is on chronic anticoagulation w/ Eliquis. Her most recent echocardiogram was 02/2017 and showed normal LVEF and only mild MR. EF was 55-60%.   She states that she has done well. She denies anginal symptomatology. She goes to the [facility] 3 days a week and walks 1 mile. She denies any exertional CP and dyspnea with her 1 mile walks. She also denies palpitations, LEE, orthopnea, PND, syncope/near syncope. She reports full med compliance. BP is well controlled at 124/70. EKG shows V-paced rhythm with PVCs. 71 bpm.    Cardiac Studies 08/11/17  Study Conclusions  - Left ventricle: The cavity size was normal. Systolic function was   normal. The estimated ejection fraction was in the range of 55%   to 60%. Wall motion was normal; there were no regional wall   motion abnormalities. Features are consistent with a pseudonormal   left ventricular filling pattern, with concomitant abnormal   relaxation and increased filling pressure (grade 2 diastolic   dysfunction). Doppler parameters are consistent with high   ventricular filling pressure. - Aortic valve: There was no regurgitation. - Mitral valve: Transvalvular velocity was within the normal range.   There was no evidence for stenosis. There was trivial   regurgitation. - Left atrium: The atrium was severely dilated. - Right ventricle: The cavity size was normal. Wall thickness was   normal. Systolic function was normal. - Tricuspid valve: There was no regurgitation. -  Pericardium, extracardiac: There was a left pleural effusion.  Impressions:  - Compared with the echo 03/24/86, the systolic function has   improved.  Current Meds  Medication Sig  . antiseptic oral rinse (BIOTENE) LIQD 15 mLs by Mouth Rinse route as needed for dry mouth.  . Carboxymethylcellul-Glycerin (LUBRICATING EYE DROPS OP) Apply 1 drop to eye daily as needed (dry eyes).  Marland Kitchen ELIQUIS 5 MG TABS tablet TAKE 1 TABLET BY MOUTH TWO  TIMES DAILY  . hydrochlorothiazide (HYDRODIURIL) 25 MG tablet Take 1 tablet (25 mg total) by mouth daily.  Marland Kitchen levothyroxine (SYNTHROID, LEVOTHROID) 88 MCG tablet Take 88 mcg by mouth daily before breakfast.   . losartan (COZAAR) 50 MG tablet TAKE 1 TABLET BY MOUTH  DAILY  . vitamin B-12 (CYANOCOBALAMIN) 100 MCG tablet Take 100 mcg by mouth daily.  . Vitamin D, Ergocalciferol, (DRISDOL) 50000 units CAPS capsule Take 50,000 Units by mouth every 7 (seven) days.   Allergies  Allergen Reactions  . Adhesive [Tape] Rash    Including EKG electrodes and the like.  . Codeine Other (See Comments)    unknown  . Latex Itching  . Percodan [Oxycodone-Aspirin] Nausea And Vomiting    Nausea    Past Medical History:  Diagnosis Date  . Allergic rhinitis   . Anemia   . Breast cancer, left breast ([place]) 01/15/15   Invasive Mammary  . Chronic venous insufficiency   . Hematuria    negative workup - Dr. Reece Agar  . Hypertension   . Migraine headache   . MVP (mitral valve prolapse)   . PAF (paroxysmal atrial fibrillation) ([place])   .  SVT (supraventricular tachycardia) ([place]) 06/2010   s/p AVNRT ablation  . Wears glasses    Family History  Problem Relation Age of Onset  . Hypertension Mother   . Heart disease Mother   . Arthritis Mother   . Other Father        hardening of the arteries  . Heart Problems Sister        pacemaker  . Heart attack Brother   . Throat cancer Paternal Uncle   . Breast cancer Sister        early 47s  . Alzheimer's disease Sister   .  Pulmonary disease Sister   . Breast cancer Daughter 7       Negative genetic testing   . Breast cancer Paternal Aunt        dx over 19  . Cancer Paternal Aunt        cancer in her leg  . Breast cancer Cousin        multiple paternal cousin   Past Surgical History:  Procedure Laterality Date  . A-FLUTTER ABLATION N/A 02/04/2017   Procedure: A-Flutter Ablation;  Surgeon: Evans Lance, MD;  Location: [place] [facility];  Service: Cardiovascular;  Laterality: N/A;  . ABDOMINAL HYSTERECTOMY    . AV NODE ABLATION N/A 08/17/2017   Procedure: AV Node Ablation;  Surgeon: Evans Lance, MD;  Location: [facility];  Service: Cardiovascular;  Laterality: N/A;  . BREAST LUMPECTOMY WITH RADIOACTIVE SEED LOCALIZATION Left 02/14/2015   Procedure: BREAST LUMPECTOMY WITH RADIOACTIVE SEED LOCALIZATION;  Surgeon: Alphonsa Overall, MD;  Location: [facility];  Service: General;  Laterality: Left;  . BREAST SURGERY  1990   rt br bx  . CARDIAC CATHETERIZATION  2011   ablasion  . CARDIOVERSION N/A 08/10/2017   Procedure: CARDIOVERSION;  Surgeon: Jerline Pain, MD;  Location: [facility];  Service: Cardiovascular;  Laterality: N/A;  . COLONOSCOPY    . DILATION AND CURETTAGE OF UTERUS    . Left Breast Biopsy  01/15/15  . PACEMAKER IMPLANT N/A 08/17/2017   Procedure: Pacemaker Implant;  Surgeon: Evans Lance, MD;  Location: [facility];  Service: Cardiovascular;  Laterality: N/A;  . TUBAL LIGATION     Social History   Socioeconomic History  . Marital status: Married    Spouse name: Not on file  . Number of children: 3  . Years of education: Not on file  . Highest education level: Not on file  Occupational History  . Not on file  Social Needs  . Financial resource strain: Not on file  . Food insecurity:    Worry: Not on file    Inability: Not on file  . Transportation needs:    Medical: Not on file    Non-medical: Not on file  Tobacco Use  . Smoking status: Never Smoker   . Smokeless tobacco: Never Used  . Tobacco comment: did smoke about 2cigs long time about 54 plus years ago  Substance and Sexual Activity  . Alcohol use: Yes    Alcohol/week: 0.0 standard drinks    Comment: once a week  . Drug use: No  . Sexual activity: Not on file  Lifestyle  . Physical activity:    Days per week: Not on file    Minutes per session: Not on file  . Stress: Not on file  Relationships  . Social connections:    Talks on phone: Not on file    Gets together: Not  on file    Attends religious service: Not on file    Active member of club or organization: Not on file    Attends meetings of clubs or organizations: Not on file    Relationship status: Not on file  . Intimate partner violence:    Fear of current or ex partner: Not on file    Emotionally abused: Not on file    Physically abused: Not on file    Forced sexual activity: Not on file  Other Topics Concern  . Not on file  Social History Narrative   LIves with husband.    Retired.      Review of Systems: General: negative for chills, fever, night sweats or weight changes.  Cardiovascular: negative for chest pain, dyspnea on exertion, edema, orthopnea, palpitations, paroxysmal nocturnal dyspnea or shortness of breath Dermatological: negative for rash Respiratory: negative for cough or wheezing Urologic: negative for hematuria Abdominal: negative for nausea, vomiting, diarrhea, bright red blood per rectum, melena, or hematemesis Neurologic: negative for visual changes, syncope, or dizziness All other systems reviewed and are otherwise negative except as noted above.   Physical Exam:  Blood pressure 124/70, pulse 78, height 5\' 9"  (1.753 m), weight 161 lb (73 kg), SpO2 96 %.  General appearance: alert, cooperative and no distress Neck: no carotid bruit and no JVD Lungs: clear to auscultation bilaterally Heart: regular rate and rhythm, S1, S2 normal, no murmur, click, rub or gallop Extremities:  extremities normal, atraumatic, no cyanosis or edema Pulses: 2+ and symmetric Skin: Skin color, texture, turgor normal. No rashes or lesions Neurologic: Grossly normal  EKG   V-paced rhythm with PVCs. 71 bpm-- personally reviewed   ASSESSMENT AND PLAN:   1. Preoperative Examination: 84 y.o. female  with a history of SVT s/p AVNRT ablation, HTN, MVP, PAF, PPM and chronic venous insufficiency. Recent echo in 2018 showed normal LVEF and only mild MR. She denies anginal symptomatology. She goes to the [facility] 3 days a week and walks 1 mile. She denies any exertional CP and dyspnea with her 1 mile walks. She also denies palpitations, LEE, orthopnea, PND, syncope/near syncope. BP is well controlled at 124/70. Physical exam is benign. EKG shows  V-paced rhythm with PVCs. 71 bpm. Based on assessment, pt is felt to be low risk for planned orthopedic surgery, bunionectomy. She can proceed as planned w/o need for further cardiac testing. I've also discussed anticoagulation w/ our clinic pharmacist. Per office protocol, it is ok for pt to hold Eliquis 24-48 hr prior to procedure.    Follow-Up w/ Dr. Lovena Le in Dec, as planned.   Traycen Goyer Ladoris Gene, MHS [facility] 08/30/2018 12:05 PM

## 2018-08-30 NOTE — Patient Instructions (Signed)
Medication Instructions:  Your physician recommends that you continue on your current medications as directed. Please refer to the Current Medication list given to you today.  Labwork: NONE  Testing/Procedures: NONE  Follow-Up: Your physician wants you to follow-up with Dr. Lovena Le in December.   If you need a refill on your cardiac medications before your next appointment, please call your pharmacy.

## 2018-09-01 ENCOUNTER — Telehealth: Payer: Self-pay | Admitting: Cardiology

## 2018-09-01 NOTE — Telephone Encounter (Signed)
Patient had questions about holding her eliquis. Per Ellen Henri PA note on 08/30/18,  "I've also discussed anticoagulation w/ our clinic pharmacist. Per office protocol, it is ok for pt to hold Eliquis 24-48 hr prior to procedure."  Informed patient of this. Since patient's procedure is on Monday. Informed patient that she could hold her Eliquis Saturday evening, all day Sunday, and Monday morning. Patient verbalized understanding and had no other questions at this time.

## 2018-09-01 NOTE — Telephone Encounter (Signed)
Follow up:    Patient calling concerning some advice some medication.

## 2018-09-11 ENCOUNTER — Telehealth: Payer: Self-pay | Admitting: *Deleted

## 2018-09-11 NOTE — Telephone Encounter (Signed)
"  I'm reading this booklet and it's saying someone will call me with an estimate before my surgery.  My doctor does not require anything up front does he?  He'll just file it with the insurance first, correct?"  Yes, that is correct, we will file it then bill you if necessary.  "Okay, that's what I thought.  That was my question."

## 2018-09-12 ENCOUNTER — Other Ambulatory Visit: Payer: Self-pay | Admitting: Internal Medicine

## 2018-09-12 ENCOUNTER — Telehealth: Payer: Self-pay | Admitting: Internal Medicine

## 2018-09-12 ENCOUNTER — Encounter: Payer: Self-pay | Admitting: Podiatry

## 2018-09-12 DIAGNOSIS — M2012 Hallux valgus (acquired), left foot: Secondary | ICD-10-CM

## 2018-09-12 NOTE — Telephone Encounter (Signed)
New Message         Patient is calling today to find out when she needs to go back on her "Eliquis" patient had surgery today. Pls call to advise.

## 2018-09-12 NOTE — Telephone Encounter (Signed)
Returned call to Pt.  Advised Pt to call her surgeon to determine when she should restart her Eliquis.    Pt indicates understanding-will contact her surgeon tomorrow morning.

## 2018-09-20 ENCOUNTER — Ambulatory Visit (INDEPENDENT_AMBULATORY_CARE_PROVIDER_SITE_OTHER): Payer: Medicare Other

## 2018-09-20 ENCOUNTER — Ambulatory Visit (INDEPENDENT_AMBULATORY_CARE_PROVIDER_SITE_OTHER): Payer: Self-pay

## 2018-09-20 DIAGNOSIS — M2012 Hallux valgus (acquired), left foot: Secondary | ICD-10-CM | POA: Diagnosis not present

## 2018-09-20 DIAGNOSIS — M2011 Hallux valgus (acquired), right foot: Secondary | ICD-10-CM

## 2018-09-20 LAB — CUP PACEART REMOTE DEVICE CHECK
Battery Remaining Longevity: 121 mo
Battery Voltage: 3 V
Brady Statistic AP VP Percent: 0 %
Brady Statistic AP VS Percent: 0 %
Brady Statistic AS VS Percent: 0 %
Implantable Lead Implant Date: 20180905
Implantable Lead Implant Date: 20180905
Implantable Lead Location: 753860
Implantable Lead Model: 5076
Implantable Pulse Generator Implant Date: 20180905
Lead Channel Impedance Value: 266 Ohm
Lead Channel Impedance Value: 342 Ohm
Lead Channel Impedance Value: 570 Ohm
Lead Channel Pacing Threshold Amplitude: 0.5 V
Lead Channel Sensing Intrinsic Amplitude: 1.5 mV
Lead Channel Sensing Intrinsic Amplitude: 1.5 mV
Lead Channel Sensing Intrinsic Amplitude: 1.625 mV
Lead Channel Setting Pacing Pulse Width: 0.4 ms
Lead Channel Setting Sensing Sensitivity: 1.2 mV
MDC IDC LEAD LOCATION: 753859
MDC IDC MSMT LEADCHNL RA SENSING INTR AMPL: 1.625 mV
MDC IDC MSMT LEADCHNL RV IMPEDANCE VALUE: 361 Ohm
MDC IDC MSMT LEADCHNL RV PACING THRESHOLD PULSEWIDTH: 0.4 ms
MDC IDC SESS DTM: 20190917060411
MDC IDC SET LEADCHNL RV PACING AMPLITUDE: 2.5 V
MDC IDC STAT BRADY AS VP PERCENT: 0 %
MDC IDC STAT BRADY RA PERCENT PACED: 0 %
MDC IDC STAT BRADY RV PERCENT PACED: 95.7 %

## 2018-09-20 NOTE — Progress Notes (Signed)
DOS 09/12/2018 Keller/McBride Bunionectomy LT

## 2018-09-22 NOTE — Progress Notes (Signed)
Patient is here today for follow-up appointment, date of surgery 09/12/2018, Rogue Jury bunionectomy formed on the left foot.  Patient states that overall her foot is feeling pretty good, but she does feel like the bandages little too tight.  Patient denies fever, chills, nausea, vomiting.  Patient is afebrile, vital signs stable.  Temperature 98.2, blood pressure 133/78.  She is currently taking her pain meds as she needs them.  Noted well-healing surgical sites, no redness, no erythema, no drainage, minimal swelling at this time.  No other signs and symptoms of infection.  Sutures present and intact, no gapping noted.  X-rays were obtained at this visit, and they were reviewed by Dr. Amalia Hailey with the patient in the room.  I advised patient that she could begin to get her foot wet next week, but she was to ambulate carefully in and out of the shower without protection on her foot.  She is to remain in her her Darco shoe at all times.  Follow-up in 3 weeks, or sooner if any acute symptom changes.

## 2018-09-27 DIAGNOSIS — M25562 Pain in left knee: Secondary | ICD-10-CM | POA: Insufficient documentation

## 2018-10-11 ENCOUNTER — Ambulatory Visit (INDEPENDENT_AMBULATORY_CARE_PROVIDER_SITE_OTHER): Payer: Medicare Other | Admitting: Podiatry

## 2018-10-11 ENCOUNTER — Ambulatory Visit (INDEPENDENT_AMBULATORY_CARE_PROVIDER_SITE_OTHER): Payer: Medicare Other

## 2018-10-11 DIAGNOSIS — M2011 Hallux valgus (acquired), right foot: Secondary | ICD-10-CM

## 2018-10-11 DIAGNOSIS — M2012 Hallux valgus (acquired), left foot: Secondary | ICD-10-CM

## 2018-10-12 NOTE — Progress Notes (Signed)
Subjective:   Patient ID: [name], female   DOB: 84 y.o.   MRN: [id number]   HPI Patient states left foot feels really good with minimal discomfort and walking with no limping gait   ROS      Objective:  Physical Exam  Neurovascular status intact negative Homans sign was noted with patient's left forefoot healing well wound edges well coapted hallux in relatively rectus position with diminishment of large structural bunion deformity     Assessment:  Doing well modified McBride type bunionectomy left     Plan:  Advised on gradual return to soft shoe gear and dispensed ankle compression stocking to reduce swelling and continue elevation as needed immobilization is needed and will be seen back in approximately 4 weeks  X-ray indicates satisfactory resection of bone with no indications of significant pathology

## 2018-11-28 ENCOUNTER — Ambulatory Visit (INDEPENDENT_AMBULATORY_CARE_PROVIDER_SITE_OTHER): Payer: Medicare Other

## 2018-11-28 DIAGNOSIS — I4821 Permanent atrial fibrillation: Secondary | ICD-10-CM | POA: Diagnosis not present

## 2018-11-28 NOTE — Progress Notes (Signed)
Remote pacemaker transmission.   

## 2018-11-29 ENCOUNTER — Encounter: Payer: Self-pay | Admitting: Cardiology

## 2018-12-11 ENCOUNTER — Encounter: Payer: Self-pay | Admitting: Internal Medicine

## 2018-12-11 ENCOUNTER — Ambulatory Visit: Payer: Medicare Other | Admitting: Internal Medicine

## 2018-12-11 VITALS — BP 122/82 | HR 93 | Ht 69.0 in | Wt 166.0 lb

## 2018-12-11 DIAGNOSIS — Z950 Presence of cardiac pacemaker: Secondary | ICD-10-CM | POA: Diagnosis not present

## 2018-12-11 DIAGNOSIS — I4821 Permanent atrial fibrillation: Secondary | ICD-10-CM

## 2018-12-11 LAB — CUP PACEART INCLINIC DEVICE CHECK
Battery Voltage: 3 V
Brady Statistic AS VP Percent: 0 %
Brady Statistic RA Percent Paced: 0 %
Brady Statistic RV Percent Paced: 94.77 %
Date Time Interrogation Session: 20191230145131
Implantable Lead Implant Date: 20180905
Implantable Lead Location: 753859
Implantable Lead Location: 753860
Implantable Lead Model: 3830
Lead Channel Impedance Value: 437 Ohm
Lead Channel Impedance Value: 570 Ohm
Lead Channel Pacing Threshold Pulse Width: 0.4 ms
Lead Channel Sensing Intrinsic Amplitude: 2 mV
Lead Channel Sensing Intrinsic Amplitude: 3 mV
Lead Channel Sensing Intrinsic Amplitude: 3.125 mV
Lead Channel Setting Pacing Amplitude: 2.5 V
Lead Channel Setting Sensing Sensitivity: 1.2 mV
MDC IDC LEAD IMPLANT DT: 20180905
MDC IDC MSMT BATTERY REMAINING LONGEVITY: 123 mo
MDC IDC MSMT LEADCHNL RA IMPEDANCE VALUE: 342 Ohm
MDC IDC MSMT LEADCHNL RA SENSING INTR AMPL: 2.875 mV
MDC IDC MSMT LEADCHNL RV IMPEDANCE VALUE: 285 Ohm
MDC IDC MSMT LEADCHNL RV PACING THRESHOLD AMPLITUDE: 0.5 V
MDC IDC PG IMPLANT DT: 20180905
MDC IDC SET LEADCHNL RV PACING PULSEWIDTH: 0.4 ms
MDC IDC STAT BRADY AP VP PERCENT: 0 %
MDC IDC STAT BRADY AP VS PERCENT: 0 %
MDC IDC STAT BRADY AS VS PERCENT: 0 %

## 2018-12-11 NOTE — Patient Instructions (Addendum)
Medication Instructions:  Your physician recommends that you continue on your current medications as directed. Please refer to the Current Medication list given to you today.  Labwork: None ordered.  Testing/Procedures: None ordered.  Follow-Up: Your physician wants you to follow-up in: 1 year with Dr. Lovena Le. You will receive a reminder letter in the mail two months in advance. If you don't receive a letter, please call our office to schedule the follow-up appointment.  Remote monitoring is used to monitor your Pacemaker from home. This monitoring reduces the number of office visits required to check your device to one time per year. It allows [place] to keep an eye on the functioning of your device to ensure it is working properly. You are scheduled for a device check from home on 02/27/2019. You may send your transmission at any time that day. If you have a wireless device, the transmission will be sent automatically. After your physician reviews your transmission, you will receive a postcard with your next transmission date.  Any Other Special Instructions Will Be Listed Below (If Applicable).  If you need a refill on your cardiac medications before your next appointment, please call your pharmacy.

## 2018-12-11 NOTE — Progress Notes (Signed)
HPI [name] returns today for ongoing evaluation of her atrial fib with a RVR, s/p AV node ablation and his bundle PPM insertion.  The patient is an 84 year old woman who is developed persistent atrial fibrillation with a very rapid ventricular response.  Since her procedure she has done very well with no palpitations.  She does have dyspnea when she walks up a flight of stairs or steep incline and has to slow down.  She does not have symptomatic atrial fibrillation. No peripheral edema.  She admits to some sodium indiscretion. Allergies  Allergen Reactions  . Adhesive [Tape] Rash    Including EKG electrodes and the like.  . Codeine Other (See Comments)    unknown  . Latex Itching  . Percodan [Oxycodone-Aspirin] Nausea And Vomiting    Nausea      Current Outpatient Medications  Medication Sig Dispense Refill  . antiseptic oral rinse (BIOTENE) LIQD 15 mLs by Mouth Rinse route as needed for dry mouth.    . Carboxymethylcellul-Glycerin (LUBRICATING EYE DROPS OP) Apply 1 drop to eye daily as needed (dry eyes).    Marland Kitchen ELIQUIS 5 MG TABS tablet TAKE 1 TABLET BY MOUTH TWO  TIMES DAILY 180 tablet 2  . hydrochlorothiazide (HYDRODIURIL) 25 MG tablet TAKE 1 TABLET ONCE DAILY. 90 tablet 3  . levothyroxine (SYNTHROID, LEVOTHROID) 88 MCG tablet Take 88 mcg by mouth daily before breakfast.     . losartan (COZAAR) 50 MG tablet TAKE 1 TABLET BY MOUTH  DAILY 90 tablet 2  . vitamin B-12 (CYANOCOBALAMIN) 100 MCG tablet Take 100 mcg by mouth daily.    . Vitamin D, Ergocalciferol, (DRISDOL) 50000 units CAPS capsule Take 50,000 Units by mouth every 7 (seven) days.     No current facility-administered medications for this visit.      Past Medical History:  Diagnosis Date  . Allergic rhinitis   . Anemia   . Breast cancer, left breast ([place]) 01/15/15   Invasive Mammary  . Chronic venous insufficiency   . Hematuria    negative workup - Dr. Reece Agar  . Hypertension   . Migraine headache   . MVP  (mitral valve prolapse)   . PAF (paroxysmal atrial fibrillation) ([place])   . SVT (supraventricular tachycardia) ([facility]) 06/2010   s/p AVNRT ablation  . Wears glasses     ROS:   All systems reviewed and negative except as noted in the HPI.   Past Surgical History:  Procedure Laterality Date  . A-FLUTTER ABLATION N/A 02/04/2017   Procedure: A-Flutter Ablation;  Surgeon: Evans Lance, MD;  Location: [facility];  Service: Cardiovascular;  Laterality: N/A;  . ABDOMINAL HYSTERECTOMY    . AV NODE ABLATION N/A 08/17/2017   Procedure: AV Node Ablation;  Surgeon: Evans Lance, MD;  Location: [facility];  Service: Cardiovascular;  Laterality: N/A;  . BREAST LUMPECTOMY WITH RADIOACTIVE SEED LOCALIZATION Left 02/14/2015   Procedure: BREAST LUMPECTOMY WITH RADIOACTIVE SEED LOCALIZATION;  Surgeon: Alphonsa Overall, MD;  Location: [facility];  Service: General;  Laterality: Left;  . BREAST SURGERY  1990   rt br bx  . CARDIAC CATHETERIZATION  2011   ablasion  . CARDIOVERSION N/A 08/10/2017   Procedure: CARDIOVERSION;  Surgeon: Jerline Pain, MD;  Location: [facility];  Service: Cardiovascular;  Laterality: N/A;  . COLONOSCOPY    . DILATION AND CURETTAGE OF UTERUS    . Left Breast Biopsy  01/15/15  . PACEMAKER IMPLANT N/A 08/17/2017  Procedure: Pacemaker Implant;  Surgeon: Evans Lance, MD;  Location: [place] [facility];  Service: Cardiovascular;  Laterality: N/A;  . TUBAL LIGATION       Family History  Problem Relation Age of Onset  . Hypertension Mother   . Heart disease Mother   . Arthritis Mother   . Other Father        hardening of the arteries  . Heart Problems Sister        pacemaker  . Heart attack Brother   . Throat cancer Paternal Uncle   . Breast cancer Sister        early 15s  . Alzheimer's disease Sister   . Pulmonary disease Sister   . Breast cancer Daughter 30       Negative genetic testing   . Breast cancer Paternal Aunt        dx over 62    . Cancer Paternal Aunt        cancer in her leg  . Breast cancer Cousin        multiple paternal cousin     Social History   Socioeconomic History  . Marital status: Married    Spouse name: Not on file  . Number of children: 3  . Years of education: Not on file  . Highest education level: Not on file  Occupational History  . Not on file  Social Needs  . Financial resource strain: Not on file  . Food insecurity:    Worry: Not on file    Inability: Not on file  . Transportation needs:    Medical: Not on file    Non-medical: Not on file  Tobacco Use  . Smoking status: Never Smoker  . Smokeless tobacco: Never Used  . Tobacco comment: did smoke about 2cigs long time about 54 plus years ago  Substance and Sexual Activity  . Alcohol use: Yes    Alcohol/week: 0.0 standard drinks    Comment: once a week  . Drug use: No  . Sexual activity: Not on file  Lifestyle  . Physical activity:    Days per week: Not on file    Minutes per session: Not on file  . Stress: Not on file  Relationships  . Social connections:    Talks on phone: Not on file    Gets together: Not on file    Attends religious service: Not on file    Active member of club or organization: Not on file    Attends meetings of clubs or organizations: Not on file    Relationship status: Not on file  . Intimate partner violence:    Fear of current or ex partner: Not on file    Emotionally abused: Not on file    Physically abused: Not on file    Forced sexual activity: Not on file  Other Topics Concern  . Not on file  Social History Narrative   LIves with husband.    Retired.      BP 122/82   Pulse 93   Ht 5\' 9"  (1.753 m)   Wt 166 lb (75.3 kg)   LMP  (LMP Unknown)   SpO2 95%   BMI 24.51 kg/m   Physical Exam:  Well appearing NAD HEENT: Unremarkable Neck:  No JVD, no thyromegally Lymphatics:  No adenopathy Back:  No CVA tenderness Lungs:  Clear with no wheezes HEART:  Regular rate rhythm, no  murmurs, no rubs, no clicks Abd:  soft, positive bowel sounds,  no organomegally, no rebound, no guarding Ext:  2 plus pulses, no edema, no cyanosis, no clubbing Skin:  No rashes no nodules Neuro:  CN II through XII intact, motor grossly intact   DEVICE  Normal device function.  See PaceArt for details.   Assess/Plan: 1. Chronic atrial fib - her rates are controlled. She will continue her current meds. 2. PPM - her Medtronic DDD PM has been reprogrammed and her His bundle lead in RV port will be programmed VVIR. 3. Diastolic heart failure - she is class 2A. She is encouraged to continue her current meds and maintain a low sodium diet.  Mikle Bosworth.D.

## 2018-12-26 ENCOUNTER — Other Ambulatory Visit: Payer: Self-pay | Admitting: Internal Medicine

## 2018-12-31 LAB — CUP PACEART REMOTE DEVICE CHECK
Battery Remaining Longevity: 118 mo
Battery Voltage: 3 V
Brady Statistic AP VP Percent: 0 %
Brady Statistic AP VS Percent: 0 %
Brady Statistic AS VS Percent: 0 %
Implantable Lead Implant Date: 20180905
Implantable Lead Location: 753860
Implantable Lead Model: 5076
Implantable Pulse Generator Implant Date: 20180905
Lead Channel Impedance Value: 266 Ohm
Lead Channel Impedance Value: 323 Ohm
Lead Channel Pacing Threshold Amplitude: 0.375 V
Lead Channel Pacing Threshold Pulse Width: 0.4 ms
Lead Channel Sensing Intrinsic Amplitude: 1.5 mV
Lead Channel Sensing Intrinsic Amplitude: 4 mV
Lead Channel Setting Pacing Amplitude: 2.5 V
Lead Channel Setting Pacing Pulse Width: 0.4 ms
Lead Channel Setting Sensing Sensitivity: 1.2 mV
MDC IDC LEAD IMPLANT DT: 20180905
MDC IDC LEAD LOCATION: 753859
MDC IDC MSMT LEADCHNL RA IMPEDANCE VALUE: 532 Ohm
MDC IDC MSMT LEADCHNL RA SENSING INTR AMPL: 4 mV
MDC IDC MSMT LEADCHNL RV IMPEDANCE VALUE: 361 Ohm
MDC IDC MSMT LEADCHNL RV SENSING INTR AMPL: 1.5 mV
MDC IDC SESS DTM: 20191217060343
MDC IDC STAT BRADY AS VP PERCENT: 0 %
MDC IDC STAT BRADY RA PERCENT PACED: 0 %
MDC IDC STAT BRADY RV PERCENT PACED: 93.28 %

## 2019-02-10 ENCOUNTER — Other Ambulatory Visit: Payer: Self-pay | Admitting: Internal Medicine

## 2019-02-27 ENCOUNTER — Encounter: Payer: Self-pay | Admitting: Hematology and Oncology

## 2019-02-27 ENCOUNTER — Ambulatory Visit (INDEPENDENT_AMBULATORY_CARE_PROVIDER_SITE_OTHER): Payer: Medicare Other | Admitting: *Deleted

## 2019-02-27 ENCOUNTER — Other Ambulatory Visit: Payer: Self-pay

## 2019-02-27 DIAGNOSIS — I4821 Permanent atrial fibrillation: Secondary | ICD-10-CM

## 2019-02-28 LAB — CUP PACEART REMOTE DEVICE CHECK
Battery Voltage: 3 V
Brady Statistic AP VP Percent: 0 %
Brady Statistic AS VP Percent: 93.54 %
Brady Statistic AS VS Percent: 6.46 %
Brady Statistic RA Percent Paced: 0 %
Brady Statistic RV Percent Paced: 93.54 %
Implantable Lead Location: 753860
Implantable Lead Model: 3830
Implantable Lead Model: 5076
Lead Channel Impedance Value: 494 Ohm
Lead Channel Impedance Value: 570 Ohm
Lead Channel Pacing Threshold Amplitude: 0.625 V
Lead Channel Pacing Threshold Pulse Width: 0.4 ms
Lead Channel Sensing Intrinsic Amplitude: 1.625 mV
Lead Channel Sensing Intrinsic Amplitude: 2.875 mV
Lead Channel Sensing Intrinsic Amplitude: 3.125 mV
Lead Channel Setting Pacing Amplitude: 2.5 V
Lead Channel Setting Pacing Pulse Width: 0.4 ms
MDC IDC LEAD IMPLANT DT: 20180905
MDC IDC LEAD IMPLANT DT: 20180905
MDC IDC LEAD LOCATION: 753859
MDC IDC MSMT BATTERY REMAINING LONGEVITY: 124 mo
MDC IDC MSMT LEADCHNL RA IMPEDANCE VALUE: 342 Ohm
MDC IDC MSMT LEADCHNL RV IMPEDANCE VALUE: 285 Ohm
MDC IDC MSMT LEADCHNL RV SENSING INTR AMPL: 1.625 mV
MDC IDC PG IMPLANT DT: 20180905
MDC IDC SESS DTM: 20200316131025
MDC IDC SET LEADCHNL RV SENSING SENSITIVITY: 1.2 mV
MDC IDC STAT BRADY AP VS PERCENT: 0 %

## 2019-03-07 ENCOUNTER — Encounter: Payer: Self-pay | Admitting: Cardiology

## 2019-03-07 NOTE — Progress Notes (Signed)
Remote pacemaker transmission.   

## 2019-04-29 ENCOUNTER — Other Ambulatory Visit: Payer: Self-pay | Admitting: Internal Medicine

## 2019-04-30 NOTE — Telephone Encounter (Signed)
Prescription refill requested for eliquis. Spoke with Dr. Sherryll Burger office and requested labs from February of 2020.  86 y.o F Weight: 75.3 kg (12/01/2018) LOV: 12-11-2018 SrCr: pending from PCP.

## 2019-04-30 NOTE — Telephone Encounter (Signed)
Prescription refill requested for eliquis 5mg . Spoke with Dr. Sherryll Burger office and requested labs from February of 2020.  86 y.o F Weight: 75.3 kg (12/01/2018) LOV: 12-11-2018 w/ Dr. Lovena Le SrCr: 0.78(01/30/19)

## 2019-05-29 ENCOUNTER — Ambulatory Visit (INDEPENDENT_AMBULATORY_CARE_PROVIDER_SITE_OTHER): Payer: Medicare Other | Admitting: *Deleted

## 2019-05-29 DIAGNOSIS — I4821 Permanent atrial fibrillation: Secondary | ICD-10-CM | POA: Diagnosis not present

## 2019-05-29 LAB — CUP PACEART REMOTE DEVICE CHECK
Battery Remaining Longevity: 121 mo
Battery Voltage: 3 V
Brady Statistic AP VP Percent: 0 %
Brady Statistic AP VS Percent: 0 %
Brady Statistic AS VP Percent: 98.94 %
Brady Statistic AS VS Percent: 1.06 %
Brady Statistic RA Percent Paced: 0 %
Brady Statistic RV Percent Paced: 98.94 %
Date Time Interrogation Session: 20200616061620
Implantable Lead Implant Date: 20180905
Implantable Lead Implant Date: 20180905
Implantable Lead Location: 753859
Implantable Lead Location: 753860
Implantable Lead Model: 3830
Implantable Lead Model: 5076
Implantable Pulse Generator Implant Date: 20180905
Lead Channel Impedance Value: 285 Ohm
Lead Channel Impedance Value: 342 Ohm
Lead Channel Impedance Value: 494 Ohm
Lead Channel Impedance Value: 570 Ohm
Lead Channel Pacing Threshold Amplitude: 0.625 V
Lead Channel Pacing Threshold Pulse Width: 0.4 ms
Lead Channel Sensing Intrinsic Amplitude: 1.375 mV
Lead Channel Sensing Intrinsic Amplitude: 1.375 mV
Lead Channel Sensing Intrinsic Amplitude: 2.875 mV
Lead Channel Sensing Intrinsic Amplitude: 3.125 mV
Lead Channel Setting Pacing Amplitude: 2.5 V
Lead Channel Setting Pacing Pulse Width: 0.4 ms
Lead Channel Setting Sensing Sensitivity: 1.2 mV

## 2019-06-08 ENCOUNTER — Encounter: Payer: Self-pay | Admitting: Cardiology

## 2019-06-08 NOTE — Progress Notes (Signed)
Remote pacemaker transmission.   

## 2019-07-01 ENCOUNTER — Other Ambulatory Visit: Payer: Self-pay | Admitting: Internal Medicine

## 2019-07-02 NOTE — Telephone Encounter (Signed)
43f 75.3kg Scr 0.78(04/30/19) Lovw/taylor(12/11/18)

## 2019-07-31 ENCOUNTER — Telehealth: Payer: Self-pay | Admitting: Hematology and Oncology

## 2019-07-31 NOTE — Telephone Encounter (Signed)
I talk with patient regarding phone visit  °

## 2019-07-31 NOTE — Assessment & Plan Note (Signed)
Left breast biopsy: Invasive lobular cancer, grade 2, ER 94%, PR 29%, Ki-67 19%, HER-2 negative, 1.7 x 1.6 x 1.4 cm by MRI on 01/21/2015, clinical stage TI cN0 M0 stage IA. Left lumpectomy 02/14/2015: 1.7 cm invasive lobular cancer, grade 1, ER 94%, PA 29%, Ki-67 19%, HER-2 negative, T1 cN0 M0 stage IA pathologic staging Current treatment: Anastrozole 1 mg daily started 04/27/2015 stopped August 2016 due to dry mouth and lack of taste; change in treatment to letrozole 2.5 mg daily from 09/21/2015 stopped in February 2017  Breast Cancer Surveillance: 1. Breast exam 8/27/2019is without any clinical concerns for disease recurrence 2. Mammograms: 02/27/2019 at [facility]: Benign  Patient has 2 daughters with breast cancer.  She tells me that they were tested for genetics and were negative. CT chest 02/10/2018 for interstitial lung disease: Scattered areas of pulmonary fibrosis, cardiomegaly  Return to clinic in 1 year for follow-up

## 2019-08-06 ENCOUNTER — Telehealth: Payer: Self-pay | Admitting: Hematology and Oncology

## 2019-08-06 NOTE — Telephone Encounter (Signed)
Confirmed appt and verified info. °

## 2019-08-06 NOTE — Progress Notes (Signed)
HEMATOLOGY-ONCOLOGY TELEPHONE VISIT PROGRESS NOTE  I connected with [name] on 08/07/2019 at  8:45 AM EDT by telephone and verified that I am speaking with the correct person using two identifiers.  I discussed the limitations, risks, security and privacy concerns of performing an evaluation and management service by telephone and the availability of in person appointments.  I also discussed with the patient that there may be a patient responsible charge related to this service. The patient expressed understanding and agreed to proceed.   History of Present Illness: [name] is a 85 y.o. female with above-mentioned history of left breast cancer treated with lumpectomy. She could not tolerate anti-estrogen therapy with letrozole or anastrozole and is currently on surveillance. I last saw her a year ago. Mammogram on 02/27/19 showed no evidence of malignancy bilaterally. She presents over the phone today for follow-up.  C/O fatigue.  Oncology History  Breast cancer of lower-outer quadrant of left female breast ([place])  01/15/2015 Initial Biopsy   Left breast biopsy: Invasive lobular cancer, grade 2, ER+ (94%), PR+ (29%),  HER-2 negative, Ki-67 19%.   01/21/2015 Breast MRI   Left breast:: 1.7 x 1.6 x 1.4 cm irregular enhancing mass lower outer quadrant, no abnormal lymph nodes   01/21/2015 Clinical Stage   Stage IA: TI cN0 M0.   02/14/2015 Surgery   Left breast lumpectomy ([facility]): Invasive lobular cancer, negative for LV I, tumor size 01.7 cm, margins negative, HER-2 was repeated and it was negative   02/14/2015 Pathologic Stage   Stage IA: T1c N0   02/26/2015 Miscellaneous   Pt opted for no RT.   03/12/2015 Procedure   Genetic testing: OvaNext gene panel  ([facility]) revealed VUS at ATM. Otherwise negative BARD1, BRCA1, BRCA2, BRIP1, CDH1, CHEK2, EPCAM, MLH1, MRE11A, MSH2, MSH6, MUTYH, NBN, NF1, PALB2, PMS2, PTEN, RAD50, RAD51C, RAD51D, SMARCA4, STK11, TP53.   04/29/2015 -  02/09/2016 Anti-estrogen oral therapy   Anastrozole 1 mg daily changed to letrozole 2.5 mg daily 09/19/2015 due to dry mouth and loss of taste, stopped due to continuation of same side effects   08/22/2015 Survivorship   A copy of the survivorship care plan was mailed to the patient in lieu of an in-person visit at her request.     Observations/Objective:  No evidence of disease recurrence.   Assessment Plan:  Breast cancer of lower-outer quadrant of left female breast Left breast biopsy: Invasive lobular cancer, grade 2, ER 94%, PR 29%, Ki-67 19%, HER-2 negative, 1.7 x 1.6 x 1.4 cm by MRI on 01/21/2015, clinical stage TI cN0 M0 stage IA. Left lumpectomy 02/14/2015: 1.7 cm invasive lobular cancer, grade 1, ER 94%, PA 29%, Ki-67 19%, HER-2 negative, T1 cN0 M0 stage IA pathologic staging Current treatment: Anastrozole 1 mg daily started 04/27/2015 stopped August 2016 due to dry mouth and lack of taste; change in treatment to letrozole 2.5 mg daily from 09/21/2015 stopped in February 2017  Breast Cancer Surveillance: 1. Breast exam 8/27/2019is without any clinical concerns for disease recurrence 2. Mammograms: 02/27/2019 at [facility]: Benign  Patient has 2 daughters with breast cancer.  She tells me that they were tested for genetics and were negative. CT chest 02/10/2018 for interstitial lung disease: Scattered areas of pulmonary fibrosis, cardiomegaly (pace maker)  Patient sees Dr.Stoneking. Return to clinic on an as needed basis  I discussed the assessment and treatment plan with the patient. The patient was provided an opportunity to ask questions and all were answered. The patient agreed with  the plan and demonstrated an understanding of the instructions. The patient was advised to call back or seek an in-person evaluation if the symptoms worsen or if the condition fails to improve as anticipated.   I provided 15 minutes of non-face-to-face time during this encounter.   Rulon Eisenmenger, MD  08/07/2019    I, Molly Dorshimer, am acting as scribe for Nicholas Lose, MD.  I have reviewed the above documentation for accuracy and completeness, and I agree with the above.

## 2019-08-07 ENCOUNTER — Inpatient Hospital Stay: Payer: Medicare Other | Attending: Hematology and Oncology | Admitting: Hematology and Oncology

## 2019-08-07 DIAGNOSIS — Z170 Estrogen receptor positive status [ER+]: Secondary | ICD-10-CM

## 2019-08-07 DIAGNOSIS — C50512 Malignant neoplasm of lower-outer quadrant of left female breast: Secondary | ICD-10-CM

## 2019-08-28 ENCOUNTER — Ambulatory Visit (INDEPENDENT_AMBULATORY_CARE_PROVIDER_SITE_OTHER): Payer: Medicare Other | Admitting: *Deleted

## 2019-08-28 DIAGNOSIS — I482 Chronic atrial fibrillation, unspecified: Secondary | ICD-10-CM

## 2019-08-28 DIAGNOSIS — I5041 Acute combined systolic (congestive) and diastolic (congestive) heart failure: Secondary | ICD-10-CM

## 2019-08-28 LAB — CUP PACEART REMOTE DEVICE CHECK
Battery Remaining Longevity: 116 mo
Battery Voltage: 3 V
Brady Statistic AP VP Percent: 0 %
Brady Statistic AP VS Percent: 0 %
Brady Statistic AS VP Percent: 97.42 %
Brady Statistic AS VS Percent: 2.58 %
Brady Statistic RA Percent Paced: 0 %
Brady Statistic RV Percent Paced: 97.42 %
Date Time Interrogation Session: 20200915061228
Implantable Lead Implant Date: 20180905
Implantable Lead Implant Date: 20180905
Implantable Lead Location: 753859
Implantable Lead Location: 753860
Implantable Lead Model: 3830
Implantable Lead Model: 5076
Implantable Pulse Generator Implant Date: 20180905
Lead Channel Impedance Value: 266 Ohm
Lead Channel Impedance Value: 342 Ohm
Lead Channel Impedance Value: 456 Ohm
Lead Channel Impedance Value: 570 Ohm
Lead Channel Pacing Threshold Amplitude: 0.5 V
Lead Channel Pacing Threshold Pulse Width: 0.4 ms
Lead Channel Sensing Intrinsic Amplitude: 1.625 mV
Lead Channel Sensing Intrinsic Amplitude: 1.625 mV
Lead Channel Sensing Intrinsic Amplitude: 2.875 mV
Lead Channel Sensing Intrinsic Amplitude: 3.125 mV
Lead Channel Setting Pacing Amplitude: 2.5 V
Lead Channel Setting Pacing Pulse Width: 0.4 ms
Lead Channel Setting Sensing Sensitivity: 1.2 mV

## 2019-09-04 NOTE — Progress Notes (Signed)
Remote pacemaker transmission.   

## 2019-09-11 ENCOUNTER — Ambulatory Visit: Payer: Medicare Other | Admitting: Internal Medicine

## 2019-09-11 ENCOUNTER — Other Ambulatory Visit: Payer: Self-pay

## 2019-09-11 ENCOUNTER — Encounter: Payer: Self-pay | Admitting: Internal Medicine

## 2019-09-11 VITALS — BP 114/58 | HR 74 | Ht 69.0 in | Wt 166.0 lb

## 2019-09-11 DIAGNOSIS — J849 Interstitial pulmonary disease, unspecified: Secondary | ICD-10-CM | POA: Diagnosis not present

## 2019-09-11 NOTE — Patient Instructions (Addendum)
ICD-10-CM   1. ILD (interstitial lung disease) (HCC)  J84.9     Clinically mild and stable in past 18 months Respect expectant followup approach  Plan/Followup - 12 months or sooner if needed; at followup do simple walk test and ILD dyspnea symptoms core

## 2019-09-11 NOTE — Progress Notes (Signed)
HPI  PPC Tracy Argyle, MD   HPI   IOV 02/11/2017  Chief Complaint  Patient presents with  . Pulmonary Consult    Pt referred by Dr. Crissie Green for COPD. Pt c/o DOE. Pt deneis cough and CP/tightness.     85 year old female referred by Dr. Crissie Green electrophysiology for evaluation of dyspnea with associated walking desaturation positive test.  She presents with her husband. History is gained from talking to her, came and review and summarization of the referral notes. It appears she has been dyspneic for at least a few months insidious onset. Not necessarily progressive. Moderate in intensity. Present with exertion relieved by rest. Possible associated wheezing but no associated orthopnea paroxysmal nocturnal dyspnea. She has chronic edema that is unchanged. She has chronic osteoarthritis is unchanged. This no associated collagen vascular disease. Review of electrophysiology from 02/08/2017 and summarization shows that she had remote supraventricular tachycardia treated with ablation. Dr. physiology study in February 2018 showed left atrial flutter with RVR and status post direct cardioversion. And when she returns for follow-up with Dr. Lovena Green on 02/08/2017 walking desaturation test was positive and she was referred to pulmonary.  Echocardiogram 01/28/2017 shows left ventricle ejection fraction of 30-35 percent which is a significant drop from 2011 when it was 55-60 percent  Imaging: Last CT scan of the chest 01/13/2012: Personally visualized this was not a high-resolution CT chest. She has biapical mild scarring but otherwise unremarkable  May 2013 swallow study shows gastroesophageal reflux       Walking desat test 185 feet x 3 laps: with forehead probe: DID NOT DESAT below 99%  feno 02/11/2017 - > 34ppd and grey zone   Meds on apixaban and recent lisinopril start feb 2018 (there is hx of mild wheezing toio)    02/21/2017 Follow up : DOE Patient presents for a  one-week follow-up. Patient was seen last visit for a pulmonary consult for dyspnea with exertion and exertional hypoxemia noted at a cardiology appointment. Patient is followed for systolic congestive heart failure and A Fib . Echo last month showed significant drop in EF at 30-35% (from EF 60% 2011) . A repeat walk test in the pulmonary office showed no  oxygen desaturations. She was set up for a high resolution CT chest. This showed mild patchy subpleural reticulation and groundglass attenuation throughout both lungs. There was no significant traction bronchiectasis or honeycombing. There was no significant change since 2013 comparison to a chest CT. I went over these results with her and her husband.  She underwent cardioversion last month for A fib /Flutter. Metoprolol was increased and Lisinopril was added.  She says she is feeling much better. Says her dyspnea has decreased . Getting her energy level back.   Never smoker , homemaker . No extensive travel . (Lived in [place] for couple of years in 1960s) . From [place] . No unusal hobbies . No amiodarone or Macrobid use.  No chemo /Radiation. Exposure   .  IMPRESSION: CT chest 02/17/17 1. Mild patchy subpleural reticulation and mild heterogeneous ground-glass attenuation throughout both lungs, without a clear basilar gradient. No significant traction bronchiectasis. No frank honeycombing. No appreciable interval change since 01/13/2012 chest CT. Findings may indicate an interstitial lung disease such as nonspecific interstitial pneumonia (NSIP). Findings are not compatible with usual interstitial pneumonia (UIP) . 2. Mild patchy air trapping in both lungs, compatible with small airways disease. 3. Mild cardiomegaly.  Trace dependent bilateral pleural effusions. 4. Mild mediastinal lymphadenopathy is  stable since 2013, compatible with benign reactive adenopathy. 5. Aortic atherosclerosis. Ascending thoracic aortic 4.3 cm aneurysm, mildly  increased. Recommend annual imaging followup by CTA or MRA. This recommendation follows 2010 ACCF/AHA/AATS/ACR/ASA/SCA/SCAI/SIR/STS/SVM Guidelines for the Diagnosis and Management of Patients with Thoracic Aortic Disease. Circulation. 2010; [id number]:5694362.   Electronically Signed   By: [name] M.D.   On: 02/17/2017 10:31   OV 05/17/2017  Chief Complaint  Patient presents with  . Follow-up    review PFT.  pt denies any current breathing complaints.     Follow-up dyspnea on exertion in the setting of new onset chronic systolic heart failure. Has a history of desaturation the cardiology office that prompted this referral. At my first intake visit with her in March 2018 her exhaled nitric oxide was slightly high we had a lisinopril stopped. After that she underwent cardioversion for atrial fibrillation. With all this her dyspnea has indeed improved. She did have a high-resolution CT chest in March 2018 that suggested possible presence of ILD. She is followed that up with today with pulmonary function test that suggest restriction with a low DLCO again consistent with ILD. She is a lifelong nonsmoker. In the past she did not desaturate walking in our office. She denies any collagen-vascular symptoms. She does have some crackles on the exam today which are faint though they're not classic for ILD    PV 03/07/2018  Chief Complaint  Patient presents with  . Follow-up    SOB with exertion but overall better   85 year old female with suspected NSIP pattern of interstitial lung disease.  There is a 48-month follow-up.  She is only had minimal changes on her CT scan of the chest and even on exam classic crackles could not be heard although she did have reduced diffusion defect on pulmonary function testing.  In the 9 months since she has had a pacemaker insertion and is doing exercise at the [facility] and her dyspnea is resolved.  She only has mild dyspnea for climbing an incline.  Her [facility]  interstitial lung disease questionnaire shows excellent status of health described below.  She did have of follow-up CT scan of the chest high-resolution and my own personal visualization ILD has been stable for many years right now.  It is indeterminate for UIP. SHe is willing to come for followup but does not hve want PFT or CT unless definitely needed   K-BILD ILD QUESTIONNAIRE, Symptom score over prior 2 weeks  7-none, 6-rarely, 5-occ, 5-some times, 3-sev times, 2-most times, 1-every time 03/07/2018   Dyspnea for stairs, incline or hill 6  Chest Tightness 7  Worry about seriousness of lung complaint 7  Avoided doing things that make you dyspneic 7  Have you felt loss of control of lung condition (reversed from original) 7  Felt fed up due to lung condition 7  Felt urge to breathe aka air hunger 7  Has lung condition made you feel anxious 7  How often have you experienced wheezing or whistling sound 7  How much of the time have you felt your lung dz is getting worse 7  How much has your lung condition interfered with job or daily task 7  Were you expecting your lung condition to get worse 7  How much has your lung function limited you carrying things like groceris 7  How much has your lung function made you think of EOL? 7  Total   Are you financially worse off 7  Grand Total  Walking desaturation test on 03/07/2018 185 feet x 3 laps on ROOM AIR:  did not, no dyspnea, moderate pace and did not desaturate. Rest pulse ox was 98%, final pulse ox was 99%. HR response 76/min at rest to 91/min at peak exertion. Patient [name]  Did not Desaturate < 88% . Toshiye J Ballinger did not  Desaturated </= 3% points. Anastasija J Feuerstein yes did get tachyardic    Results for KENZLEIGH, WEISBRODT (MRN [id number]) as of 05/17/2017 12:50  Ref. Range 05/17/2017 11:00  FVC-Pre Latest Units: L 2.00  FVC-%Pred-Pre Latest Units: % 67  FEV1-Pre Latest Units: L 1.46  FEV1-%Pred-Pre Latest Units: % 66  Pre  FEV1/FVC ratio Latest Units: % 73  Results for LYNNLEE, KAGARISE (MRN [id number]) as of 05/17/2017 12:50  Ref. Range 05/17/2017 11:00  TLC Latest Units: L 4.31  TLC % pred Latest Units: % 77  Results for CHALYN, THACHER (MRN [id number]) as of 05/17/2017 12:50  Ref. Range 05/17/2017 11:00  DLCO cor Latest Units: ml/min/mmHg 15.99  DLCO cor % pred Latest Units: % 55    IMPRESSION: HRCT  1. There are again some scattered areas of mild pulmonary fibrosis, as detailed above, with a CT pattern that is considered indeterminate for UIP (usual interstitial pneumonia). Findings are again favored to reflect mild nonspecific interstitial pneumonia (NSIP). 2. Cardiomegaly with mild left atrial dilatation. 3. Aortic atherosclerosis, in addition to left anterior descending coronary artery disease. 4. Ectasia of the ascending thoracic aorta which measures 4.2 cm in diameter. Recommend annual imaging followup by CTA or MRA. This recommendation follows 2010 ACCF/AHA/AATS/ACR/ASA/SCA/SCAI/SIR/STS/SVM Guidelines for the Diagnosis and Management of Patients with Thoracic Aortic Disease. Circulation. 2010; [id number].  Aortic Atherosclerosis (ICD10-I70.0).   Electronically Signed   By: Vinnie Langton M.D.   On: 02/10/2018 15:23   OV 09/11/2019  Subjective:  Patient ID: [name], female , DOB: 01-Jan-1933 , age 51 y.o. , MRN: [id number] , ADDRESS: [name] [id number]   09/11/2019 -   Chief Complaint  Patient presents with  . Follow-up    last seen 02/2018.  pt c/o stable sob with exertion.     85 year old female with suspected NSIP pattern of interstitial lung disease - supportive care. Not interested in PFT or HRCT.   HPI DEVONDRA NAKANO 85 y.o. -presents for 1 year follow-up although it has been 18 months since I last saw her.  She tells me the interim no new admissions.  She tells medical assistant that after her pacemaker her dyspnea is actually improved.  Overall she  feels stable.  No new admissions.  She is up-to-date with the flu shot.  She does not want routine follow-up breathing tests or CT scans but just wants clinical follow-up.  Her symptom score is mild.  She did not desaturate with exertion.    SYMPTOM SCALE - ILD 09/11/2019   O2 use RA  Shortness of Breath 0 -> 5 scale with 5 being worst (score 6 If unable to do)  At rest 0  Simple tasks - showers, clothes change, eating, shaving 0  Household (dishes, doing bed, laundry) 1  Shopping x  Walking level at own pace 2  Walking keeping up with others of same age 4  Walking up Stairs 2  Walking up [place] 3  Total (40 - 48) Dyspnea Score 10  How bad is your cough? 0  How bad is your fatigue 0  Simple office walk 185 feet x  3 laps goal with forehead probe 09/11/2019   O2 used RA  Number laps completed 3  Comments about pace slow  Resting Pulse Ox/HR 98% and 73/min  Final Pulse Ox/HR 98% and 94/min  Desaturated </= 88% no  Desaturated <= 3% points no  Got Tachycardic >/= 90/min yes  Symptoms at end of test No dyspnea  Miscellaneous comments Did not stop       ROS - per HPI     has a past medical history of Allergic rhinitis, Anemia, Breast cancer, left breast ([place]) (01/15/15), Chronic venous insufficiency, Hematuria, Hypertension, Migraine headache, MVP (mitral valve prolapse), PAF (paroxysmal atrial fibrillation) ([place]), SVT (supraventricular tachycardia) ([place]) (06/2010), and Wears glasses.   reports that she has never smoked. She has never used smokeless tobacco.  Past Surgical History:  Procedure Laterality Date  . A-FLUTTER ABLATION N/A 02/04/2017   Procedure: A-Flutter Ablation;  Surgeon: Evans Lance, MD;  Location: [facility];  Service: Cardiovascular;  Laterality: N/A;  . ABDOMINAL HYSTERECTOMY    . AV NODE ABLATION N/A 08/17/2017   Procedure: AV Node Ablation;  Surgeon: Evans Lance, MD;  Location: [facility];  Service: Cardiovascular;   Laterality: N/A;  . BREAST LUMPECTOMY WITH RADIOACTIVE SEED LOCALIZATION Left 02/14/2015   Procedure: BREAST LUMPECTOMY WITH RADIOACTIVE SEED LOCALIZATION;  Surgeon: Alphonsa Overall, MD;  Location: [facility];  Service: General;  Laterality: Left;  . BREAST SURGERY  1990   rt br bx  . CARDIAC CATHETERIZATION  2011   ablasion  . CARDIOVERSION N/A 08/10/2017   Procedure: CARDIOVERSION;  Surgeon: Jerline Pain, MD;  Location: [facility];  Service: Cardiovascular;  Laterality: N/A;  . COLONOSCOPY    . DILATION AND CURETTAGE OF UTERUS    . Left Breast Biopsy  01/15/15  . PACEMAKER IMPLANT N/A 08/17/2017   Procedure: Pacemaker Implant;  Surgeon: Evans Lance, MD;  Location: [facility];  Service: Cardiovascular;  Laterality: N/A;  . TUBAL LIGATION      Allergies  Allergen Reactions  . Adhesive [Tape] Rash    Including EKG electrodes and the like.  . Codeine Other (See Comments)    unknown  . Latex Itching  . Percodan [Oxycodone-Aspirin] Nausea And Vomiting    Nausea     Immunization History  Administered Date(s) Administered  . Influenza Split 10/08/2009, 09/15/2010, 10/28/2011, 10/31/2012, 09/27/2013, 09/17/2015  . Influenza, High Dose Seasonal PF 09/06/2014, 09/22/2015, 08/31/2017, 08/29/2018, 08/21/2019  . Influenza,inj,Quad PF,6+ Mos 09/12/2016  . Pneumococcal Conjugate-13 09/06/2014  . Pneumococcal Polysaccharide-23 09/12/2002, 06/20/2017  . Td 12/13/2005  . Zoster 08/14/2008    Family History  Problem Relation Age of Onset  . Hypertension Mother   . Heart disease Mother   . Arthritis Mother   . Other Father        hardening of the arteries  . Heart Problems Sister        pacemaker  . Heart attack Brother   . Throat cancer Paternal Uncle   . Breast cancer Sister        early 66s  . Alzheimer's disease Sister   . Pulmonary disease Sister   . Breast cancer Daughter 41       Negative genetic testing   . Breast cancer Paternal Aunt        dx over  83  . Cancer Paternal Aunt        cancer in her leg  .  Breast cancer Cousin        multiple paternal cousin     Current Outpatient Medications:  .  antiseptic oral rinse (BIOTENE) LIQD, 15 mLs by Mouth Rinse route as needed for dry mouth., Disp: , Rfl:  .  Carboxymethylcellul-Glycerin (LUBRICATING EYE DROPS OP), Apply 1 drop to eye daily as needed (dry eyes)., Disp: , Rfl:  .  ELIQUIS 5 MG TABS tablet, TAKE 1 TABLET BY MOUTH  TWICE DAILY, Disp: 180 tablet, Rfl: 0 .  hydrochlorothiazide (HYDRODIURIL) 25 MG tablet, TAKE 1 TABLET ONCE DAILY., Disp: 90 tablet, Rfl: 3 .  levothyroxine (SYNTHROID, LEVOTHROID) 88 MCG tablet, Take 88 mcg by mouth daily before breakfast. , Disp: , Rfl:  .  losartan (COZAAR) 50 MG tablet, TAKE 1 TABLET BY MOUTH  DAILY, Disp: 90 tablet, Rfl: 3 .  vitamin B-12 (CYANOCOBALAMIN) 100 MCG tablet, Take 100 mcg by mouth daily., Disp: , Rfl:  .  Vitamin D, Ergocalciferol, (DRISDOL) 50000 units CAPS capsule, Take 50,000 Units by mouth every 7 (seven) days., Disp: , Rfl:       Objective:   Vitals:   09/11/19 1024  BP: (!) 114/58  Pulse: 74  SpO2: 94%  Weight: 166 lb (75.3 kg)  Height: 5\' 9"  (1.753 m)    Estimated body mass index is 24.51 kg/m as calculated from the following:   Height as of this encounter: 5\' 9"  (1.753 m).   Weight as of this encounter: 166 lb (75.3 kg).  @WEIGHTCHANGE @  [employer]   09/11/19 1024  Weight: 166 lb (75.3 kg)     Physical Exam  General Appearance:    Alert, cooperative, no distress, appears stated age - yes , Deconditioned looking - yes mild and frail somewhat , OBESE  - no, Sitting on Wheelchair -  no  Head:    Normocephalic, without obvious abnormality, atraumatic  Eyes:    PERRL, conjunctiva/corneas clear,  Ears:    Normal TM's and external ear canals, both ears  Nose:   Nares normal, septum midline, mucosa normal, no drainage    or sinus tenderness. OXYGEN ON  - no . Patient is @ ra   Throat:   Lips, mucosa, and  tongue normal; teeth and gums normal. Cyanosis on lips - no  Neck:   Supple, symmetrical, trachea midline, no adenopathy;    thyroid:  no enlargement/tenderness/nodules; no carotid   bruit or JVD  Back:     Symmetric, no curvature, ROM normal, no CVA tenderness  Lungs:     Distress - no , Wheeze no, Barrell Chest - no, Purse lip breathing - no, Crackles - no   Chest Wall:    No tenderness or deformity.    Heart:    Regular rate and rhythm, S1 and S2 normal, no rub   or gallop, Murmur - no  Breast Exam:    NOT DONE  Abdomen:     Soft, non-tender, bowel sounds active all four quadrants,    no masses, no organomegaly. Visceral obesity - no  Genitalia:   NOT DONE  Rectal:   NOT DONE  Extremities:   Extremities - normal, Has Cane - no, Clubbing - no, Edema - no  Pulses:   2+ and symmetric all extremities  Skin:   Stigmata of Connective Tissue Disease - no  Lymph nodes:   Cervical, supraclavicular, and axillary nodes normal  Psychiatric:  Neurologic:   Pleasant - yes, Anxious - non, Flat affect - no  CAm-ICU - neg, Alert  and Oriented x 3 - yes, Moves all 4s - yes, Speech - normal, Cognition - intact           Assessment:       ICD-10-CM   1. ILD (interstitial lung disease) ([facility])  J84.9        Plan:     Patient Instructions     ICD-10-CM   1. ILD (interstitial lung disease) ([facility])  J84.9     Clinically mild and stable in past 18 months Respect expectant followup approach  Plan/Followup - 12 months or sooner if needed; at followup do simple walk test and ILD dyspnea symptoms core      SIGNATURE    Dr. Brand Males, M.D., F.C.C.P,  Pulmonary and Critical Care Medicine Staff Physician, [place] Director - Interstitial Lung Disease  Program  Pulmonary [facility] at [place], [name], [address]  Pager: [phone], If no answer or between  15:00h - 7:00h: call [phone] Telephone: [phone]   10:53 AM 09/11/2019

## 2019-09-19 ENCOUNTER — Other Ambulatory Visit: Payer: Self-pay | Admitting: Internal Medicine

## 2019-09-19 NOTE — Telephone Encounter (Signed)
Prescription refill request for Eliquis received.  Last office visit: Lovena Le (12-11-2018) Scr: 0.78 (01-30-2019) Age: 85 y.o. Weight: 75.3 kg  Prescription refill sent.

## 2019-10-28 ENCOUNTER — Other Ambulatory Visit: Payer: Self-pay | Admitting: Internal Medicine

## 2019-10-29 ENCOUNTER — Ambulatory Visit (INDEPENDENT_AMBULATORY_CARE_PROVIDER_SITE_OTHER): Payer: Medicare Other

## 2019-10-29 ENCOUNTER — Ambulatory Visit: Payer: Medicare Other | Admitting: Podiatry

## 2019-10-29 ENCOUNTER — Other Ambulatory Visit: Payer: Self-pay

## 2019-10-29 ENCOUNTER — Encounter: Payer: Self-pay | Admitting: Podiatry

## 2019-10-29 DIAGNOSIS — M779 Enthesopathy, unspecified: Secondary | ICD-10-CM

## 2019-10-29 DIAGNOSIS — M2011 Hallux valgus (acquired), right foot: Secondary | ICD-10-CM

## 2019-10-29 DIAGNOSIS — M2012 Hallux valgus (acquired), left foot: Secondary | ICD-10-CM

## 2019-10-31 NOTE — Progress Notes (Signed)
Subjective:   Patient ID: [name], female   DOB: 85 y.o.   MRN: [id number]   HPI Patient presents stating that she has been developing pain around the big toe joint left and its been inflamed and hard for her to wear shoe gear with comfortably.  States it is been going on a while and she has been trying to avoid more advanced treatment if possible   ROS      Objective:  Physical Exam  Neurovascular status intact with patient found to have structural bunion deformity bilateral with inflammation pain upon palpation with fluid buildup around the first MPJ of the left foot     Assessment:  Chronic structural bunion deformity bilateral with capsulitis inflammation fluid buildup around the first MPJ left     Plan:  H&P reviewed conditions and at this point I recommended conservative treatment with the possibility of surgical intervention that I educated her on today.  I did sterile prep left injected around the first MPJ 3 mg Dexasone Kenalog 5 mg Xylocaine to reduce inflammation advised on wider shoe soaks and if symptoms persist will need to consider structural correction  X-rays indicate that there is enlargement around the first metatarsal head bilateral with mild osteoporosis and no other signs of pathology currently

## 2019-11-27 ENCOUNTER — Ambulatory Visit (INDEPENDENT_AMBULATORY_CARE_PROVIDER_SITE_OTHER): Payer: Medicare Other | Admitting: *Deleted

## 2019-11-27 DIAGNOSIS — I4819 Other persistent atrial fibrillation: Secondary | ICD-10-CM

## 2019-11-27 LAB — CUP PACEART REMOTE DEVICE CHECK
Battery Remaining Longevity: 114 mo
Battery Voltage: 3 V
Brady Statistic AP VP Percent: 0 %
Brady Statistic AP VS Percent: 0 %
Brady Statistic AS VP Percent: 90.06 %
Brady Statistic AS VS Percent: 9.94 %
Brady Statistic RA Percent Paced: 0 %
Brady Statistic RV Percent Paced: 90.06 %
Date Time Interrogation Session: 20201215011559
Implantable Lead Implant Date: 20180905
Implantable Lead Implant Date: 20180905
Implantable Lead Location: 753859
Implantable Lead Location: 753860
Implantable Lead Model: 3830
Implantable Lead Model: 5076
Implantable Pulse Generator Implant Date: 20180905
Lead Channel Impedance Value: 285 Ohm
Lead Channel Impedance Value: 342 Ohm
Lead Channel Impedance Value: 475 Ohm
Lead Channel Impedance Value: 570 Ohm
Lead Channel Pacing Threshold Amplitude: 0.5 V
Lead Channel Pacing Threshold Pulse Width: 0.4 ms
Lead Channel Sensing Intrinsic Amplitude: 1.625 mV
Lead Channel Sensing Intrinsic Amplitude: 1.625 mV
Lead Channel Sensing Intrinsic Amplitude: 2.875 mV
Lead Channel Sensing Intrinsic Amplitude: 3.125 mV
Lead Channel Setting Pacing Amplitude: 2.5 V
Lead Channel Setting Pacing Pulse Width: 0.4 ms
Lead Channel Setting Sensing Sensitivity: 1.2 mV

## 2019-12-20 ENCOUNTER — Other Ambulatory Visit: Payer: Self-pay

## 2019-12-20 ENCOUNTER — Encounter (HOSPITAL_BASED_OUTPATIENT_CLINIC_OR_DEPARTMENT_OTHER): Payer: Medicare Other | Admitting: Internal Medicine

## 2019-12-20 DIAGNOSIS — Z950 Presence of cardiac pacemaker: Secondary | ICD-10-CM | POA: Insufficient documentation

## 2019-12-20 DIAGNOSIS — Z853 Personal history of malignant neoplasm of breast: Secondary | ICD-10-CM | POA: Insufficient documentation

## 2019-12-20 DIAGNOSIS — I482 Chronic atrial fibrillation, unspecified: Secondary | ICD-10-CM | POA: Diagnosis not present

## 2019-12-20 DIAGNOSIS — Z7901 Long term (current) use of anticoagulants: Secondary | ICD-10-CM | POA: Insufficient documentation

## 2019-12-20 DIAGNOSIS — I87331 Chronic venous hypertension (idiopathic) with ulcer and inflammation of right lower extremity: Secondary | ICD-10-CM | POA: Diagnosis not present

## 2019-12-20 DIAGNOSIS — I890 Lymphedema, not elsewhere classified: Secondary | ICD-10-CM | POA: Insufficient documentation

## 2019-12-20 DIAGNOSIS — L97812 Non-pressure chronic ulcer of other part of right lower leg with fat layer exposed: Secondary | ICD-10-CM | POA: Diagnosis present

## 2019-12-20 DIAGNOSIS — L97212 Non-pressure chronic ulcer of right calf with fat layer exposed: Secondary | ICD-10-CM | POA: Diagnosis not present

## 2019-12-20 DIAGNOSIS — M1712 Unilateral primary osteoarthritis, left knee: Secondary | ICD-10-CM | POA: Insufficient documentation

## 2019-12-20 DIAGNOSIS — J849 Interstitial pulmonary disease, unspecified: Secondary | ICD-10-CM | POA: Insufficient documentation

## 2019-12-20 NOTE — Progress Notes (Signed)
[name], [name] ([id number]) Visit Report for 12/20/2019 Abuse/Suicide Risk Screen Details Patient Name: Date of Service: [name], [name] 12/20/2019 10:30 AM Medical Record [id number] Patient Account Number: [id number] Date of Birth/Sex: 1933/07/02 (86 y.o. Female) Treating RN: Kela Millin Primary Care Zuleima Haser: Patrina Levering Other Clinician: Referring Darianna Amy: Treating Ivalee Strauser/Extender:Robson, Secundino Ginger, Hal T Weeks in Treatment: 0 Abuse/Suicide Risk Screen Items Answer ABUSE RISK SCREEN: Has anyone close to you tried to hurt or harm you recentlyo No Do you feel uncomfortable with anyone in your familyo No Has anyone forced you do things that you didnt want to doo No Electronic Signature(s) Signed: 12/20/2019 5:21:35 PM By: Kela Millin Entered By: Kela Millin on 12/20/2019 11:52:15 -------------------------------------------------------------------------------- Activities of Daily Living Details Patient Name: Date of Service: [name], [name] 12/20/2019 10:30 AM Medical Record [id number] Patient Account Number: [id number] Date of Birth/Sex: 09-20-1933 (86 y.o. Female) Treating RN: Kela Millin Primary Care Tyri Elmore: Patrina Levering Other Clinician: Referring Charon Akamine: Treating Qasim Diveley/Extender:Robson, Secundino Ginger, Hal T Weeks in Treatment: 0 Activities of Daily Living Items Answer Activities of Daily Living (Please select one for each item) Drive Automobile Completely Able Take Medications Completely Able Use Telephone Completely Able Care for Appearance Completely Able Use Toilet Completely Able Bath / Shower Completely Able Dress Self Completely Able Feed Self Completely Able Walk Completely Able Get In / Out Bed Completely Able Housework Completely Able Prepare Meals Completely Able Handle Money Completely Able Shop for Self Completely Able Electronic Signature(s) Signed: 12/20/2019 5:21:35 PM By: Kela Millin Entered By: Kela Millin on 12/20/2019 11:52:40 -------------------------------------------------------------------------------- Education Screening Details Patient Name: Date of Service: [name] 12/20/2019 10:30 AM Medical Record [id number] Patient Account Number: [id number] Date of Birth/Sex: [birth year] (86 y.o. Female) Treating RN: Kela Millin Primary Care Saba Gomm: Patrina Levering Other Clinician: Referring Lavender Stanke: Treating Borghild Thaker/Extender:Robson, Secundino Ginger, Junius Creamer in Treatment: 0 Primary Learner Assessed: Patient Learning Preferences/Education Level/Primary Language Learning Preference: Explanation Highest Education Level: College or Above Preferred Language: English Cognitive Barrier Language Barrier: No Translator Needed: No Memory Deficit: No Emotional Barrier: No Cultural/Religious Beliefs Affecting Medical Care: No Physical Barrier Impaired Vision: Yes Glasses Impaired Hearing: No Decreased Hand dexterity: No Knowledge/Comprehension Knowledge Level: High Comprehension Level: High Ability to understand written High instructions: Ability to understand verbal High instructions: Motivation Anxiety Level: Calm Cooperation: Cooperative Education Importance: Acknowledges Need Interest in Health Problems: Asks Questions Perception: Coherent Willingness to Engage in Self- High Management Activities: Readiness to Engage in Self- High Management Activities: Electronic Signature(s) Signed: 12/20/2019 5:21:35 PM By: Kela Millin Entered By: Kela Millin on 12/20/2019 11:53:26 -------------------------------------------------------------------------------- Fall Risk Assessment Details Patient Name: Date of Service: [name] 12/20/2019 10:30 AM Medical Record [id number] Patient Account Number: [id number] Date of Birth/Sex: [birth year] (86 y.o. Female) Treating RN: Kela Millin Primary Care  Dreyah Montrose: Lajean Manes T Other Clinician: Referring Ricco Dershem: Treating Anayansi Rundquist/Extender:Robson, Secundino Ginger, Hal T Weeks in Treatment: 0 Fall Risk Assessment Items Have you had 2 or more falls in the last 12 monthso 0 No Have you had any fall that resulted in injury in the last 12 monthso 0 No FALLS RISK SCREEN History of falling - immediate or within 3 months 0 No Secondary diagnosis (Do you have 2 or more medical diagnoseso) 15 Yes Ambulatory aid None/bed rest/wheelchair/nurse 0 No Crutches/cane/walker 0 No Furniture 0 No Intravenous therapy Access/Saline/Heparin Lock 0 No Weak (short steps with or without shuffle, stooped but able to lift head 0 No while walking, may seek support from furniture) Impaired (  short steps with shuffle, may have difficulty arising from chair, 0 No head down, impaired balance) Mental Status Oriented to own ability 0 Yes Overestimates or forgets limitations 0 No Risk Level: Low Risk Score: 15 Electronic Signature(s) Signed: 12/20/2019 5:21:35 PM By: Kela Millin Entered By: Kela Millin on 12/20/2019 11:53:59 -------------------------------------------------------------------------------- Foot Assessment Details Patient Name: Date of Service: [name] 12/20/2019 10:30 AM Medical Record [id number] Patient Account Number: [id number] Date of Birth/Sex: 20-Aug-1933 (86 y.o. Female) Treating RN: Kela Millin Primary Care Bethel Gaglio: Lajean Manes T Other Clinician: Referring Eliezer Khawaja: Treating Saidy Ormand/Extender:Robson, Secundino Ginger, Hal T Weeks in Treatment: 0 Foot Assessment Items Site Locations + = Sensation present, - = Sensation absent, C = Callus, U = Ulcer R = Redness, W = Warmth, M = Maceration, PU = Pre-ulcerative lesion F = Fissure, S = Swelling, D = Dryness Assessment Right: Left: Other Deformity: No No Prior Foot Ulcer: No No Prior Amputation: No No Charcot Joint: No No Ambulatory Status:  Ambulatory Without Help Gait: Steady Electronic Signature(s) Signed: 12/20/2019 5:21:35 PM By: Kela Millin Entered By: Kela Millin on 12/20/2019 11:54:31 -------------------------------------------------------------------------------- Nutrition Risk Screening Details Patient Name: Date of Service: [name], [name] 12/20/2019 10:30 AM Medical Record [id number] Patient Account Number: [id number] Date of Birth/Sex: [birth year] (86 y.o. Female) Treating RN: Kela Millin Primary Care Lavayah Vita: Lajean Manes T Other Clinician: Referring Kurstyn Larios: Treating Aulden Calise/Extender:Robson, Secundino Ginger, Hal T Weeks in Treatment: 0 Height (in): 69 Weight (lbs): 163 Body Mass Index (BMI): 24.1 Nutrition Risk Screening Items Score Screening NUTRITION RISK SCREEN: I have an illness or condition that made me change the kind and/or 0 No amount of food I eat I eat fewer than two meals per day 0 No I eat few fruits and vegetables, or milk products 0 No I have three or more drinks of beer, liquor or wine almost every day 0 No I have tooth or mouth problems that make it hard for me to eat 0 No I don't always have enough money to buy the food I need 0 No I eat alone most of the time 0 No I take three or more different prescribed or over-the-counter drugs a day 1 Yes 0 No Without wanting to, I have lost or gained 10 pounds in the last six months I am not always physically able to shop, cook and/or feed myself 0 No Nutrition Protocols Good Risk Protocol 0 No interventions needed Moderate Risk Protocol High Risk Proctocol Risk Level: Good Risk Score: 1 Electronic Signature(s) Signed: 12/20/2019 5:21:35 PM By: Kela Millin Entered By: Kela Millin on 12/20/2019 11:54:16

## 2019-12-21 NOTE — Progress Notes (Signed)
[name], [name] ([id number]) Visit Report for 12/20/2019 Chief Complaint Document Details Patient Name: Date of Service: [name], [name] 12/20/2019 10:30 AM Medical Record [id number] Patient Account Number: [id number] Date of Birth/Sex: 02/02/1933 (86 y.o. Female) Treating RN: Primary Care Provider: Lajean Manes Green Other Clinician: Referring Provider: Treating Provider/Extender:[name], [name], [name] in Treatment: 0 Information Obtained from: Patient Chief Complaint 12/20/2019 patient returns to our clinic today for review of a wound on the right lateral lower leg just above the lateral malleolus Electronic Signature(s) Signed: 12/21/2019 4:55:01 AM By: Tracy Ham [name] Entered By: [name] on 12/20/2019 12:44:29 -------------------------------------------------------------------------------- Debridement Details Patient Name: Date of Service: [name] 12/20/2019 10:30 AM Medical Record [id number] Patient Account Number: [id number] Date of Birth/Sex: 1933/04/29 (86 y.o. Female) Treating RN: Primary Care Provider: Lajean Manes Green Other Clinician: Referring Provider: Treating Provider/Extender:Jeilyn Reznik, [name], [name] in Treatment: 0 Debridement Performed for Wound #3 Right,Lateral Lower Leg Assessment: Performed By: Physician Tracy Dillon., [name] Debridement Type: Debridement Severity of Tissue Pre Fat layer exposed Debridement: Level of Consciousness (Pre- Awake and Alert procedure): Pre-procedure Verification/Time Out Taken: Yes - 12:20 Start Time: 12:21 Pain Control: Lidocaine 4% Topical Solution Total Area Debrided (L x W): 1 (cm) x 0.7 (cm) = 0.7 (cm) Tissue and other material Viable, Non-Viable, Slough, Subcutaneous, Skin: Dermis , Fibrin/Exudate, Slough Viable, Non-Viable, Slough, Subcutaneous, Skin: Dermis , Fibrin/Exudate, Slough debrided: Level: Skin/Subcutaneous Tissue Debridement Description:  Excisional Instrument: Curette Bleeding: Minimum Hemostasis Achieved: Pressure End Time: 12:26 Procedural Pain: 0 Post Procedural Pain: 3 Response to Treatment: Procedure was tolerated well Level of Consciousness Awake and Alert (Post-procedure): Post Debridement Measurements of Total Wound Length: (cm) 1 Width: (cm) 0.7 Depth: (cm) 0.3 Volume: (cm) 0.165 Character of Wound/Ulcer Post Requires Further Debridement Debridement: Severity of Tissue Post Debridement: Fat layer exposed Post Procedure Diagnosis Same as Pre-procedure Electronic Signature(s) Signed: 12/21/2019 4:55:01 AM By: Tracy Ham [name] Entered By: [name] on 12/20/2019 12:43:39 -------------------------------------------------------------------------------- HPI Details Patient Name: Date of Service: [name] 12/20/2019 10:30 AM Medical Record [id number] Patient Account Number: [id number] Date of Birth/Sex: [birth year] (86 y.o. Female) Treating RN: Primary Care Provider: Lajean Manes Green Other Clinician: Referring Provider: Treating Provider/Extender:[name], [name], [name] in Treatment: 0 History of Present Illness HPI Description: ADMISSION 12/20/2019 This is an 86 year old woman referred by her primary physician Dr. Felipa Eth for review of a wound on her right lateral lower leg. She was actually in this clinic on 2 separate occasions in 2010 and 2012 cared for by Dr. Sherilyn Cooter. At that point in time she had wounds on her right leg as well. She tells [place] to 1 month ago she noticed a scab building up on her right lateral lower leg this opened into a wound. There was no overt cause of this no trauma, no infection that she is aware of. She has a history of chronic venous insufficiency and wears compression stockings fairly religiously indeed she is done well over the last 8 years since she was last in this clinic. She has been applying Vaseline on this and a covering phone. This  is not progressing towards healing. Past medical history; interstitial lung disease, chronic atrial fibrillation status post pacemaker, osteoarthritis of the left knee, left breast CA, chronic repeat venous insufficiency. She takes Eliquis for her atrial fibrillation stroke prophylaxis. ABI in our clinic was 0.99 on the right Electronic Signature(s) Signed: 12/21/2019 4:55:01 AM By: Tracy Ham [name] Entered By: [name] on 12/20/2019 12:47:42 --------------------------------------------------------------------------------  Physical Exam Details Patient Name: Date of Service: [name], [name] 12/20/2019 10:30 AM Medical Record [id number] Patient Account Number: [id number] Date of Birth/Sex: 04/09/1933 (86 y.o. Female) Treating RN: Primary Care Provider: Lajean Manes Green Other Clinician: Referring Provider: Treating Provider/Extender:[name], [name], [name] in Treatment: 0 Constitutional Patient is hypertensive.. Pulse regular and within target range for patient.Marland Kitchen Respirations regular, non-labored and within target range.. Temperature is normal and within the target range for the patient.Marland Kitchen Appears in no distress. Eyes Conjunctivae clear. No discharge.no icterus. Respiratory work of breathing is normal. Bilateral breath sounds are clear and equal in all lobes with no wheezes, rales or rhonchi.. Cardiovascular Irregular heart sounds otherwise no murmurs no signs of CHF. Pedal pulses on the right are palpable. Lymphatic None palpable in the right popliteal or inguinal area. Musculoskeletal Severe thoracic kyphosis. Psychiatric appears at normal baseline. Notes Wound exam; the area in question is a small area just above her right lateral malleolus. Surrounding skin is tight and fibrotic probably secondary to chronic venous insufficiency [lipodermatosclerosis]. There is no evidence of surrounding infection. Electronic Signature(s) Signed: 12/21/2019 4:55:01 AM  By: Tracy Ham [name] Entered By: [name] on 12/20/2019 12:49:18 -------------------------------------------------------------------------------- Physician Orders Details Patient Name: Date of Service: [name] 12/20/2019 10:30 AM Medical Record [id number] Patient Account Number: [id number] Date of Birth/Sex: 1933-05-03 (86 y.o. Female) Treating RN: [name] Primary Care Provider: Patrina Levering Other Clinician: Referring Provider: Treating Provider/Extender:Devon Pretty, [name], [name] in Treatment: 0 Verbal / Phone Orders: No Diagnosis Coding ICD-10 Coding Code Description I10 Essential (primary) hypertension Follow-up Appointments Return Appointment in 1 week. Dressing Change Frequency Do not change entire dressing for one week. Skin Barriers/Peri-Wound Care Moisturizing lotion Wound Cleansing May shower with protection. - patient to purchase a cast protector. Primary Wound Dressing Wound #3 Right,Lateral Lower Leg Iodoflex Secondary Dressing Dry Gauze Edema Control Kerlix and Coban - Right Lower Extremity Avoid standing for long periods of time Elevate legs to the level of the heart or above for 30 minutes daily and/or when sitting, a frequency of: - throughout the day. Exercise regularly Patient Medications Allergies: codeine, adhesive, latex, Percodan Notifications Medication Indication Start End lidocaine DOSE topical 4 % gel - gel topical applied prior debridement by [name]. Electronic Signature(s) Signed: 12/20/2019 5:19:45 PM By: [name] Signed: 12/21/2019 4:55:01 AM By: Tracy Ham [name] Entered By: [name] on 12/20/2019 12:28:10 -------------------------------------------------------------------------------- Prescription 12/20/2019 Patient Name: [name] Provider: Linton Ham [name] Date of Birth: 10-22-1933 NPI#: [id number] Sex: Female DEA#: [id number] Phone #: [phone] License #: [id number] Patient  Address: [address], [place] [address] [place], [place] [address] [phone] Allergies codeine Reaction: nausea, vomiting Severity: Moderate adhesive Reaction: rash Severity: Mild latex Reaction: rash Severity: Mild Percodan Reaction: nausea Severity: Mild Medication Medication: Route: Strength: Form: lidocaine topical 4% gel Class: TOPICAL LOCAL ANESTHETICS Dose: Frequency / Time: Indication: gel topical applied prior debridement by [name]. Number of Refills: Number of Units: 0 Generic Substitution: Start Date: End Date: Administered at [place]: Yes Time Administered: Time Discontinued: Note to Pharmacy: Signature(s): Date(s): Electronic Signature(s) Signed: 12/20/2019 5:19:45 PM By: [name] Signed: 12/21/2019 4:55:01 AM By: Tracy Ham [name] Entered By: [name] on 12/20/2019 12:28:10 --------------------------------------------------------------------------------  Problem List Details Patient Name: Date of Service: [name] 12/20/2019 10:30 AM Medical Record [id number] Patient Account Number: [id number] Date of Birth/Sex: 1933/07/12 (86 y.o. Female) Treating RN:  [name] Primary Care Provider: Patrina Levering Other Clinician: Referring Provider: Treating Provider/Extender:Prescott Truex, [name], [name] in Treatment: 0 Active Problems ICD-10 Evaluated Encounter Code Description Active Date Today Diagnosis I87.321 Chronic venous hypertension (idiopathic) with 12/20/2019 No Yes inflammation of right lower extremity L97.812 Non-pressure chronic ulcer of other part of right lower 12/20/2019 No Yes leg with fat layer exposed Inactive Problems Resolved Problems Electronic Signature(s) Signed: 12/21/2019 4:55:01 AM By: Tracy Ham [name] Entered By: [name] on 12/20/2019  12:43:10 -------------------------------------------------------------------------------- Progress Note Details Patient Name: Date of Service: [name] 12/20/2019 10:30 AM Medical Record [id number] Patient Account Number: [id number] Date of Birth/Sex: 10/24/1933 (86 y.o. Female) Treating RN: Primary Care Provider: Lajean Manes Green Other Clinician: Referring Provider: Treating Provider/Extender:Herchel Hopkin, [name], [name] in Treatment: 0 Subjective Chief Complaint Information obtained from Patient 12/20/2019 patient returns to our clinic today for review of a wound on the right lateral lower leg just above the lateral malleolus History of Present Illness (HPI) ADMISSION 12/20/2019 This is an 86 year old woman referred by her primary physician Dr. Felipa Eth for review of a wound on her right lateral lower leg. She was actually in this clinic on 2 separate occasions in 2010 and 2012 cared for by Dr. Sherilyn Cooter. At that point in time she had wounds on her right leg as well. She tells [place] to 1 month ago she noticed a scab building up on her right lateral lower leg this opened into a wound. There was no overt cause of this no trauma, no infection that she is aware of. She has a history of chronic venous insufficiency and wears compression stockings fairly religiously indeed she is done well over the last 8 years since she was last in this clinic. She has been applying Vaseline on this and a covering phone. This is not progressing towards healing. Past medical history; interstitial lung disease, chronic atrial fibrillation status post pacemaker, osteoarthritis of the left knee, left breast CA, chronic repeat venous insufficiency. She takes Eliquis for her atrial fibrillation stroke prophylaxis. ABI in our clinic was 0.99 on the right Patient History Information obtained from Patient. Allergies adhesive (Severity: Mild, Reaction: rash), codeine (Severity: Moderate, Reaction:  nausea, vomiting), latex (Severity: Mild, Reaction: rash), Percodan (Severity: Mild, Reaction: nausea) Family History Cancer - Siblings,Child, Diabetes - Mother, Heart Disease - Father,Mother,Siblings, Hypertension - Child, Thyroid Problems - Child, No family history of Hereditary Spherocytosis, Kidney Disease, Lung Disease, Seizures, Stroke, Tuberculosis. Social History Former smoker, Marital Status - Married, Alcohol Use - Rarely, Drug Use - No History, Caffeine Use - Daily. Medical History Eyes Patient has history of Cataracts - both eyes Hematologic/Lymphatic Patient has history of Anemia Cardiovascular Patient has history of Arrhythmia - afib, Congestive Heart Failure, Hypertension, Peripheral Venous Disease Integumentary (Skin) Denies history of History of Burn Oncologic Patient has history of Received Radiation - radioactive seeds Denies history of Received Chemotherapy Hospitalization/Surgery History - pacemaker implant. - av node ablation. - cardioversion. - breast surgery. - hysterectomy. Medical And Surgical History Notes Cardiovascular superventricular tachycardia mitral valve prolapse Genitourinary history of hematuria Integumentary (Skin) venous insufficiency Musculoskeletal arthritis Neurologic migraines Oncologic history of breast cancer Review of Systems (ROS) Constitutional Symptoms (General Health) Denies complaints or symptoms of Fatigue, Fever, Chills, Marked Weight Change. Eyes Complains or has symptoms of Glasses / Contacts - glasses. Ear/Nose/Mouth/Throat Denies complaints or symptoms of Chronic sinus problems or rhinitis. Respiratory Denies complaints or symptoms of Chronic or frequent coughs, Shortness of Breath. Cardiovascular Denies complaints or  symptoms of Chest pain. Gastrointestinal Denies complaints or symptoms of Frequent diarrhea, Nausea, Vomiting. Endocrine Denies complaints or symptoms of Heat/cold  intolerance. Genitourinary Denies complaints or symptoms of Frequent urination. Musculoskeletal Denies complaints or symptoms of Muscle Pain, Muscle Weakness. Psychiatric Denies complaints or symptoms of Claustrophobia, Suicidal. Objective Constitutional Patient is hypertensive.. Pulse regular and within target range for patient.Marland Kitchen Respirations regular, non-labored and within target range.. Temperature is normal and within the target range for the patient.Marland Kitchen Appears in no distress. Vitals Time Taken: 11:35 AM, Height: 69 in, Source: Stated, Weight: 163 lbs, Source: Stated, BMI: 24.1, Temperature: 97.6 F, Pulse: 53 bpm, Respiratory Rate: 19 breaths/min, Blood Pressure: 149/73 mmHg. Eyes Conjunctivae clear. No discharge.no icterus. Respiratory work of breathing is normal. Bilateral breath sounds are clear and equal in all lobes with no wheezes, rales or rhonchi.. Cardiovascular Irregular heart sounds otherwise no murmurs no signs of CHF. Pedal pulses on the right are palpable. Lymphatic None palpable in the right popliteal or inguinal area. Musculoskeletal Severe thoracic kyphosis. Psychiatric appears at normal baseline. General Notes: Wound exam; the area in question is a small area just above her right lateral malleolus. Surrounding skin is tight and fibrotic probably secondary to chronic venous insufficiency [lipodermatosclerosis]. There is no evidence of surrounding infection. Integumentary (Hair, Skin) Wound #3 status is Open. Original cause of wound was Gradually Appeared. The wound is located on the Right,Lateral Lower Leg. The wound measures 1cm length x 0.7cm width x 0.1cm depth; 0.55cm^2 area and 0.055cm^3 volume. There is Fat Layer (Subcutaneous Tissue) Exposed exposed. There is no tunneling or undermining noted. There is a small amount of serous drainage noted. The wound margin is distinct with the outline attached to the wound base. There is small (1-33%) pale granulation  within the wound bed. There is a large (67- 100%) amount of necrotic tissue within the wound bed including Adherent Slough. Assessment Active Problems ICD-10 Chronic venous hypertension (idiopathic) with inflammation of right lower extremity Non-pressure chronic ulcer of other part of right lower leg with fat layer exposed Procedures Wound #3 Pre-procedure diagnosis of Wound #3 is a Venous Leg Ulcer located on the Right,Lateral Lower Leg .Severity of Tissue Pre Debridement is: Fat layer exposed. There was a Excisional Skin/Subcutaneous Tissue Debridement with a total area of 0.7 sq cm performed by Tracy Dillon., [name]. With the following instrument(s): Curette to remove Viable and Non-Viable tissue/material. Material removed includes Subcutaneous Tissue, Slough, Skin: Dermis, and Fibrin/Exudate after achieving pain control using Lidocaine 4% Topical Solution. A time out was conducted at 12:20, prior to the start of the procedure. A Minimum amount of bleeding was controlled with Pressure. The procedure was tolerated well with a pain level of 0 throughout and a pain level of 3 following the procedure. Post Debridement Measurements: 1cm length x 0.7cm width x 0.3cm depth; 0.165cm^3 volume. Character of Wound/Ulcer Post Debridement requires further debridement. Severity of Tissue Post Debridement is: Fat layer exposed. Post procedure Diagnosis Wound #3: Same as Pre-Procedure Plan Follow-up Appointments: Return Appointment in 1 week. Dressing Change Frequency: Do not change entire dressing for one week. Skin Barriers/Peri-Wound Care: Moisturizing lotion Wound Cleansing: May shower with protection. - patient to purchase a cast protector. Primary Wound Dressing: Wound #3 Right,Lateral Lower Leg: Iodoflex Secondary Dressing: Dry Gauze Edema Control: Kerlix and Coban - Right Lower Extremity Avoid standing for long periods of time Elevate legs to the level of the heart or above for 30  minutes daily and/or when sitting, a frequency of: -  throughout the day. Exercise regularly The following medication(s) was prescribed: lidocaine topical 4 % gel gel topical applied prior debridement by [name]. was prescribed at facility 1. Severe chronic venous hypertension with chronically damaged skin surrounding the wound tightly fibrotic. 2. She wears compression stockings but they look like support hose. Nevertheless there is not a lot of edema here. 3. No evidence of infection or chronic arterial insufficiency 4. The wound was debrided with a #3 curette of very adherent debris. Surprisingly there was more depth of this than I thought. I spent 35 minutes in review of medical records, physical examination and interview with the patient Electronic Signature(s) Signed: 12/21/2019 4:55:01 AM By: Tracy Ham [name] Entered By: [name] on 12/20/2019 12:52:17 -------------------------------------------------------------------------------- HxROS Details Patient Name: Date of Service: [name]. 12/20/2019 10:30 AM Medical Record [id number] Patient Account Number: [id number] Date of Birth/Sex: 10-06-1933 (86 y.o. Female) Treating RN: [name] Primary Care Provider: Patrina Levering Other Clinician: Referring Provider: Treating Provider/Extender:Colleene Swarthout, [name], [name] in Treatment: 0 Information Obtained From Patient Constitutional Symptoms (General Health) Complaints and Symptoms: Negative for: Fatigue; Fever; Chills; Marked Weight Change Eyes Complaints and Symptoms: Positive for: Glasses / Contacts - glasses Medical History: Positive for: Cataracts - both eyes Ear/Nose/Mouth/Throat Complaints and Symptoms: Negative for: Chronic sinus problems or rhinitis Respiratory Complaints and Symptoms: Negative for: Chronic or frequent coughs; Shortness of Breath Cardiovascular Complaints and Symptoms: Negative for: Chest pain Medical  History: Positive for: Arrhythmia - afib; Congestive Heart Failure; Hypertension; Peripheral Venous Disease Past Medical History Notes: superventricular tachycardia mitral valve prolapse Gastrointestinal Complaints and Symptoms: Negative for: Frequent diarrhea; Nausea; Vomiting Endocrine Complaints and Symptoms: Negative for: Heat/cold intolerance Genitourinary Complaints and Symptoms: Negative for: Frequent urination Medical History: Past Medical History Notes: history of hematuria Musculoskeletal Complaints and Symptoms: Negative for: Muscle Pain; Muscle Weakness Medical History: Past Medical History Notes: arthritis Psychiatric Complaints and Symptoms: Negative for: Claustrophobia; Suicidal Hematologic/Lymphatic Medical History: Positive for: Anemia Immunological Integumentary (Skin) Medical History: Negative for: History of Burn Past Medical History Notes: venous insufficiency Neurologic Medical History: Past Medical History Notes: migraines Oncologic Medical History: Positive for: Received Radiation - radioactive seeds Negative for: Received Chemotherapy Past Medical History Notes: history of breast cancer HBO Extended History Items Eyes: Cataracts Immunizations Pneumococcal Vaccine: Received Pneumococcal Vaccination: Yes Implantable Devices Yes Hospitalization / Surgery History Type of Hospitalization/Surgery pacemaker implant av node ablation cardioversion breast surgery hysterectomy Family and Social History Cancer: Yes - Siblings,Child; Diabetes: Yes - Mother; Heart Disease: Yes - Father,Mother,Siblings; Hereditary Spherocytosis: No; Hypertension: Yes - Child; Kidney Disease: No; Lung Disease: No; Seizures: No; Stroke: No; Thyroid Problems: Yes - Child; Tuberculosis: No; Former smoker; Marital Status - Married; Alcohol Use: Rarely; Drug Use: No History; Caffeine Use: Daily; Financial Concerns: No; Food, Clothing or Shelter Needs: No;  Support System Lacking: No; Transportation Concerns: No Electronic Signature(s) Signed: 12/20/2019 5:21:35 PM By: [name] Signed: 12/21/2019 4:55:01 AM By: Tracy Ham [name] Entered By: [name] on 12/20/2019 11:52:08 -------------------------------------------------------------------------------- SuperBill Details Patient Name: Date of Service: [name] 12/20/2019 Medical Record [id number] Patient Account Number: [id number] Date of Birth/Sex: Treating RN: Mar 19, 1933 (86 y.o. Female) Primary Care Provider: Lajean Manes Green Other Clinician: Referring Provider: Treating Provider/Extender:Shandiin Eisenbeis, [name], [name] in Treatment: 0 Diagnosis Coding ICD-10 Codes Code Description I87.321 Chronic venous hypertension (idiopathic) with inflammation of right lower extremity L97.812 Non-pressure chronic ulcer of other part of right lower leg with fat layer exposed Facility Procedures The patient participates with Medicare  or their insurance follows the Medicare Facility Guidelines: CPT4 Code Description Modifier Quantity [id number] [phone] - WOUND CARE VISIT-LEV 5 EST PT 1 The patient participates with Medicare or their insurance follows the Medicare Facility Guidelines: [id number] - DEB SUBQ TISSUE 20 SQ CM/< 1 ICD-10 Diagnosis Description I87.321 Chronic venous hypertension (idiopathic) with inflammation of right  lower extremity L97.812 Non-pressure chronic ulcer of other part of right lower leg with fat layer exposed Physician Procedures CPT4 Code Description: [id number] WC PHYS LEVEL 3 NEW PT ICD-10 Diagnosis Description I87.321 Chronic venous hypertension (idiopathic) with inflammation extremity L97.812 Non-pressure chronic ulcer of other part of right lower le Modifier: 25 of right lowe g with fat lay Quantity: 1 r er exposed CPT4 Code Description: [id number] - WC PHYS SUBQ TISS 20 SQ CM ICD-10 Diagnosis Description I87.321 Chronic venous  hypertension (idiopathic) with inflammation extremity L97.812 Non-pressure chronic ulcer of other part of right lower le Modifier: of right lowe g with fat lay Quantity: 1 r er exposed Electronic Signature(s) Signed: 12/20/2019 5:19:45 PM By: [name] Signed: 12/21/2019 4:55:01 AM By: Tracy Ham [name] Entered By: [name] on 12/20/2019 14:33:39

## 2019-12-25 NOTE — Progress Notes (Signed)
[name], [name] ([id number]) Visit Report for 12/20/2019 Allergy List Details Patient Name: Date of Service: [name], [name] 12/20/2019 10:30 AM Medical Record [id number] Patient Account Number: [id number] Date of Birth/Sex: [birth year] (86 y.o. Female) Treating RN: Kela Millin Primary Care Nakeia Calvi: Patrina Levering Other Clinician: Referring Antwone Capozzoli: Treating Jaala Bohle/Extender:Robson, Secundino Ginger, Hal T Weeks in Treatment: 0 Allergies Active Allergies adhesive Reaction: rash Severity: Mild codeine Reaction: nausea, vomiting Severity: Moderate latex Reaction: rash Severity: Mild Percodan Reaction: nausea Severity: Mild Allergy Notes Electronic Signature(s) Signed: 12/20/2019 5:21:35 PM By: Kela Millin Entered By: Kela Millin on 12/20/2019 11:39:22 -------------------------------------------------------------------------------- Arrival Information Details Patient Name: Date of Service: [name] 12/20/2019 10:30 AM Medical Record [id number] Patient Account Number: [id number] Date of Birth/Sex: [birth year] (86 y.o. Female) Treating RN: Kela Millin Primary Care Leevi Cullars: Patrina Levering Other Clinician: Referring Spiros Greenfeld: Treating Debroh Sieloff/Extender:Robson, Secundino Ginger, Hal T Weeks in Treatment: 0 Visit Information Patient Arrived: Ambulatory Arrival Time: 11:37 Accompanied By: self Transfer Assistance: None Patient Identification Verified: Yes Secondary Verification Process Yes Completed: Patient Has Alerts: Yes Patient Alerts: Patient on Blood Thinner Right ABI:0.99 History Since Last Visit Electronic Signature(s) Signed: 12/20/2019 5:21:35 PM By: Kela Millin Entered By: Kela Millin on 12/20/2019 12:04:21 -------------------------------------------------------------------------------- Clinic Level of Care Assessment Details Patient Name: Date of Service: [name], [name] 12/20/2019 10:30 AM Medical  Record [id number] Patient Account Number: [id number] Date of Birth/Sex: 09-01-1933 (86 y.o. Female) Treating RN: Deon Pilling Primary Care Catia Todorov: Patrina Levering Other Clinician: Referring Janita Camberos: Treating Blaire Palomino/Extender:Robson, Secundino Ginger, Hal T Weeks in Treatment: 0 Clinic Level of Care Assessment Items TOOL 2 Quantity Score X - Use when only an EandM is performed on the INITIAL visit 1 0 ASSESSMENTS - Nursing Assessment / Reassessment X - General Physical Exam (combine w/ comprehensive assessment (listed just below) 1 20 when performed on new pt. evals) X - Comprehensive Assessment (HX, ROS, Risk Assessments, Wounds Hx, etc.) 1 25 ASSESSMENTS - Wound and Skin Assessment / Reassessment X - Simple Wound Assessment / Reassessment - one wound 1 5 '[]'$  - Complex Wound Assessment / Reassessment - multiple wounds 0 X - Dermatologic / Skin Assessment (not related to wound area) 1 10 ASSESSMENTS - Ostomy and/or Continence Assessment and Care '[]'$  - Incontinence Assessment and Management 0 '[]'$  - Ostomy Care Assessment and Management (repouching, etc.) 0 PROCESS - Coordination of Care X - Simple Patient / Family Education for ongoing care 1 15 '[]'$  - Complex (extensive) Patient / Family Education for ongoing care 0 X - Staff obtains Programmer, systems, Records, Test Results / Process Orders 1 10 '[]'$  - Staff telephones HHA, Nursing Homes / Clarify orders / etc 0 '[]'$  - Routine Transfer to another Facility (non-emergent condition) 0 '[]'$  - Routine Hospital Admission (non-emergent condition) 0 X - New Admissions / Biomedical engineer / Ordering NPWT, Apligraf, etc. 1 15 '[]'$  - Emergency Hospital Admission (emergent condition) 0 X - Simple Discharge Coordination 1 10 '[]'$  - Complex (extensive) Discharge Coordination 0 PROCESS - Special Needs '[]'$  - Pediatric / Minor Patient Management 0 '[]'$  - Isolation Patient Management 0 '[]'$  - Hearing / Language / Visual special needs 0 '[]'$  - Assessment of  Community assistance (transportation, D/C planning, etc.) 0 '[]'$  - Additional assistance / Altered mentation 0 '[]'$  - Support Surface(s) Assessment (bed, cushion, seat, etc.) 0 INTERVENTIONS - Wound Cleansing / Measurement X - Wound Imaging (photographs - any number of wounds) 1 5 '[]'$  - Wound Tracing (instead of photographs) 0 X - Simple Wound Measurement - one  wound 1 5 '[]'$  - Complex Wound Measurement - multiple wounds 0 X - Simple Wound Cleansing - one wound 1 5 '[]'$  - Complex Wound Cleansing - multiple wounds 0 INTERVENTIONS - Wound Dressings '[]'$  - Small Wound Dressing one or multiple wounds 0 '[]'$  - Medium Wound Dressing one or multiple wounds 0 X - Large Wound Dressing one or multiple wounds 1 20 '[]'$  - Application of Medications - injection 0 INTERVENTIONS - Miscellaneous '[]'$  - External ear exam 0 '[]'$  - Specimen Collection (cultures, biopsies, blood, body fluids, etc.) 0 '[]'$  - Specimen(s) / Culture(s) sent or taken to Lab for analysis 0 '[]'$  - Patient Transfer (multiple staff / Civil Service fast streamer / Similar devices) 0 '[]'$  - Simple Staple / Suture removal (25 or less) 0 '[]'$  - Complex Staple / Suture removal (26 or more) 0 '[]'$  - Hypo / Hyperglycemic Management (close monitor of Blood Glucose) 0 X - Ankle / Brachial Index (ABI) - do not check if billed separately 1 15 Has the patient been seen at the hospital within the last three years: Yes Total Score: 160 Level Of Care: New/Established - Level 5 Electronic Signature(s) Signed: 12/20/2019 5:19:45 PM By: Deon Pilling Entered By: Deon Pilling on 12/20/2019 12:28:59 -------------------------------------------------------------------------------- Encounter Discharge Information Details Patient Name: Date of Service: [name] 12/20/2019 10:30 AM Medical Record [id number] Patient Account Number: [id number] Date of Birth/Sex: 06-29-1933 (86 y.o. Female) Treating RN: [name] Primary Care Osman Calzadilla: Patrina Levering Other Clinician: Referring  Joevanni Roddey: Treating Aerionna Moravek/Extender:Robson, Secundino Ginger, Hal T Weeks in Treatment: 0 Encounter Discharge Information Items Post Procedure Vitals Discharge Condition: Stable Temperature (F): 97.6 Ambulatory Status: Ambulatory Pulse (bpm): 53 Discharge Destination: Home Respiratory Rate (breaths/min): 19 Transportation: Private Auto Blood Pressure (mmHg): 149/73 Accompanied By: alone Schedule Follow-up Appointment: Yes Clinical Summary of Care: Patient Declined Electronic Signature(s) Signed: 12/25/2019 6:18:44 PM By: [name] RN, BSN Entered By: [name] on 12/20/2019 15:06:38 -------------------------------------------------------------------------------- Lower Extremity Assessment Details Patient Name: Date of Service: [name] 12/20/2019 10:30 AM Medical Record [id number] Patient Account Number: [id number] Date of Birth/Sex: 01/29/1933 (86 y.o. Female) Treating RN: Kela Millin Primary Care Laqueena Hinchey: Lajean Manes T Other Clinician: Referring Andjela Wickes: Treating Camelia Stelzner/Extender:Robson, Secundino Ginger, Hal T Weeks in Treatment: 0 Edema Assessment Assessed: [Left: No] [Right: No] Edema: [Left: N] [Right: o] Calf Left: Right: Point of Measurement: 42 cm From Medial Instep cm 33 cm Ankle Left: Right: Point of Measurement: 13 cm From Medial Instep cm 19.9 cm Vascular Assessment Pulses: Dorsalis Pedis Palpable: [Right:Yes] Blood Pressure: Brachial: [Right:149] Ankle: [Right:Dorsalis Pedis: 148 0.99] Electronic Signature(s) Signed: 12/20/2019 5:21:35 PM By: Kela Millin Entered By: Kela Millin on 12/20/2019 12:03:56 -------------------------------------------------------------------------------- Multi Wound Chart Details Patient Name: Date of Service: [name] 12/20/2019 10:30 AM Medical Record [id number] Patient Account Number: [id number] Date of Birth/Sex: Apr 27, 1933 (86 y.o. Female) Treating RN: Primary  Care Kolt Mcwhirter: Lajean Manes T Other Clinician: Referring Barrington Worley: Treating Mikkel Charrette/Extender:Robson, Secundino Ginger, Hal T Weeks in Treatment: 0 Vital Signs Height(in): 69 Pulse(bpm): 53 Weight(lbs): 163 Blood Pressure(mmHg): 149/73 Body Mass Index(BMI): 24 Temperature(F): 97.6 Respiratory 19 Rate(breaths/min): Photos: [3:No Photos] [N/A:N/A] Wound Location: [3:Right Lower Leg - Lateral N/A] Wounding Event: [3:Gradually Appeared] [N/A:N/A] Primary Etiology: [3:Venous Leg Ulcer] [N/A:N/A] Comorbid History: [3:Cataracts, Anemia, Arrhythmia, Congestive Heart Failure, Hypertension, Peripheral Venous Disease, Received Radiation] [N/A:N/A] Date Acquired: [3:11/19/2019] [N/A:N/A] Weeks of Treatment: [3:0] [N/A:N/A] Wound Status: [3:Open] [N/A:N/A] Measurements L x W x D 1x0.7x0.1 [N/A:N/A] (cm) Area (cm) : [3:0.55] [N/A:N/A] Volume (cm) : [3:0.055] [N/A:N/A] Classification: [3:Full  Thickness Without Exposed Support Structures] [N/A:N/A] Exudate Amount: [3:Small] [N/A:N/A] Exudate Type: [3:Serous] [N/A:N/A] Exudate Color: [3:amber] [N/A:N/A] Wound Margin: [3:Distinct, outline attached N/A] Granulation Amount: [3:Small (1-33%)] [N/A:N/A] Granulation Quality: [3:Pale] [N/A:N/A] Necrotic Amount: [3:Large (67-100%)] [N/A:N/A] Exposed Structures: [3:Fat Layer (Subcutaneous N/A Tissue) Exposed: Yes Fascia: No Tendon: No Muscle: No Joint: No Bone: No] Epithelialization: [3:None] [N/A:N/A] Debridement: [3:Debridement - Excisional N/A] Pre-procedure [3:12:20] [N/A:N/A] Verification/Time Out Taken: Pain Control: [3:Lidocaine 4% Topical Solution] [N/A:N/A] Tissue Debrided: [3:Subcutaneous, Slough] [N/A:N/A] Level: [3:Skin/Subcutaneous Tissue N/A] Debridement Area (sq cm):0.7 [N/A:N/A] Instrument: [3:Curette] [N/A:N/A] Bleeding: [3:Minimum] [N/A:N/A] Hemostasis Achieved: [3:Pressure] [N/A:N/A] Procedural Pain: [3:0] [N/A:N/A] Post Procedural Pain: [3:3] [N/A:N/A] Debridement  Treatment Procedure was tolerated [N/A:N/A] Response: [3:well] Post Debridement [3:1x0.7x0.3] [N/A:N/A] Measurements L x W x D (cm) Post Debridement [3:0.165] [N/A:N/A] Volume: (cm) Procedures Performed: Debridement [N/A:N/A] Treatment Notes Electronic Signature(s) Signed: 12/21/2019 4:55:01 AM By: Linton Ham MD Entered By: Linton Ham on 12/20/2019 12:43:22 -------------------------------------------------------------------------------- Multi-Disciplinary Care Plan Details Patient Name: Date of Service: [name]. 12/20/2019 10:30 AM Medical Record [id number] Patient Account Number: [id number] Date of Birth/Sex: 1933/06/28 (86 y.o. Female) Treating RN: Deon Pilling Primary Care Sinclaire Artiga: Patrina Levering Other Clinician: Referring Khamiya Varin: Treating Jaylah Goodlow/Extender:Robson, Secundino Ginger, Hal T Weeks in Treatment: 0 Active Inactive Abuse / Safety / Falls / Self Care Management Nursing Diagnoses: Potential for falls Goals: Patient/caregiver will verbalize understanding of skin care regimen Date Initiated: 12/20/2019 Target Resolution Date: 01/18/2020 Goal Status: Active Patient/caregiver will verbalize/demonstrate understanding of what to do in case of emergency Date Initiated: 12/20/2019 Target Resolution Date: 01/18/2020 Goal Status: Active Interventions: Assess fall risk on admission and as needed Provide education on fall prevention Notes: Orientation to the Wound Care Program Nursing Diagnoses: Knowledge deficit related to the wound healing center program Goals: Patient/caregiver will verbalize understanding of the [facility] Date Initiated: 12/20/2019 Target Resolution Date: 01/18/2020 Goal Status: Active Interventions: Provide education on orientation to the wound center Notes: Pain, Acute or Chronic Nursing Diagnoses: Pain, acute or chronic: actual or potential Potential alteration in comfort, pain Goals: Patient will  verbalize adequate pain control and receive pain control interventions during procedures as needed Date Initiated: 12/20/2019 Target Resolution Date: 01/18/2020 Goal Status: Active Patient/caregiver will verbalize comfort level met Date Initiated: 12/20/2019 Target Resolution Date: 01/18/2020 Goal Status: Active Interventions: Complete pain assessment as per visit requirements Provide education on pain management Notes: Wound/Skin Impairment Nursing Diagnoses: Knowledge deficit related to ulceration/compromised skin integrity Goals: Patient/caregiver will verbalize understanding of skin care regimen Date Initiated: 12/20/2019 Target Resolution Date: 01/11/2020 Goal Status: Active Interventions: Assess patient/caregiver ability to perform ulcer/skin care regimen upon admission and as needed Provide education on ulcer and skin care Treatment Activities: Skin care regimen initiated : 12/20/2019 Topical wound management initiated : 12/20/2019 Notes: Electronic Signature(s) Signed: 12/20/2019 5:19:45 PM By: Deon Pilling Entered By: Deon Pilling on 12/20/2019 12:02:47 -------------------------------------------------------------------------------- Pain Assessment Details Patient Name: Date of Service: [name] 12/20/2019 10:30 AM Medical Record [id number] Patient Account Number: [id number] Date of Birth/Sex: 05-29-1933 (86 y.o. Female) Treating RN: Kela Millin Primary Care Franciscojavier Wronski: Patrina Levering Other Clinician: Referring Ladeja Pelham: Treating Montell Leopard/Extender:Robson, Secundino Ginger, Hal T Weeks in Treatment: 0 Active Problems Location of Pain Severity and Description of Pain Patient Has Paino Yes Site Locations Pain Location: Pain in Ulcers With Dressing Change: Yes Duration of the Pain. Constant / Intermittento Constant Rate the pain. Current Pain Level: 1 Worst Pain Level: 5 Least Pain Level: 1 Tolerable Pain Level: 3 Character of Pain Describe  the Pain:  Aching, Burning Pain Management and Medication Current Pain Management: Electronic Signature(s) Signed: 12/20/2019 5:21:35 PM By: Kela Millin Entered By: Kela Millin on 12/20/2019 12:09:12 -------------------------------------------------------------------------------- Patient/Caregiver Education Details Patient Name: [name] 1/7/2021andnbsp10:30 Date of Service: AM Medical Record [id number] Number: Patient Account Number: [id number] Treating RN: Date of Birth/Gender: January 28, 1933 (86 y.o. Deon Pilling Female) Other Clinician: Primary Care Treating [place], Gaylene Brooks Physician: Physician/Extender: Referring Physician: Serita Grammes in Treatment: 0 Education Assessment Education Provided To: Patient Education Topics Provided Welcome To [facility]: Handouts: Welcome To [facility] Methods: Explain/Verbal, Printed Responses: Reinforcements needed Wound/Skin Impairment: Handouts: Caring for Your Ulcer, Skin Care Do's and Dont's Methods: Explain/Verbal, Printed Responses: Reinforcements needed Electronic Signature(s) Signed: 12/20/2019 5:19:45 PM By: Deon Pilling Entered By: Deon Pilling on 12/20/2019 12:03:07 -------------------------------------------------------------------------------- Wound Assessment Details Patient Name: Date of Service: [name], [name] 12/20/2019 10:30 AM Medical Record [id number] Patient Account Number: [id number] Date of Birth/Sex: [birth year] (86 y.o. Female) Treating RN: Kela Millin Primary Care Emilliano Dilworth: Lajean Manes T Other Clinician: Referring Jajuan Skoog: Treating Antwoine Zorn/Extender:Robson, Secundino Ginger, Hal T Weeks in Treatment: 0 Wound Status Wound Number: 3 Primary Venous Leg Ulcer Etiology: Wound Location: Right Lower Leg - Lateral Wound Open Wounding Event: Gradually Appeared Status: Date Acquired: 11/19/2019 Comorbid Cataracts, Anemia, Arrhythmia,  Congestive Weeks Of Treatment: 0 History: Heart Failure, Hypertension, Peripheral Clustered Wound: No Venous Disease, Received Radiation Photos Wound Measurements Length: (cm) 1 % Reduct Width: (cm) 0.7 % Reduct Depth: (cm) 0.1 Epitheli Area: (cm) 0.55 Tunneli Volume: (cm) 0.055 Undermi Wound Description Classification: Full Thickness Without Exposed Support Foul Od Structures Slough/ Wound Distinct, outline attached Margin: Exudate Small Amount: Exudate Exudate Serous Type: Exudate amber Color: Wound Bed Granulation Amount: Small (1-33%) Granulation Quality: Pale Fascia Ex Necrotic Amount: Large (67-100%) Fat Layer Necrotic Quality: Adherent Slough Tendon Ex Muscle Ex Joint Exp Bone Expo or After Cleansing: No Fibrino Yes Exposed Structure posed: No (Subcutaneous Tissue) Exposed: Yes posed: No posed: No osed: No sed: No ion in Area: 0% ion in Volume: 0% alization: None ng: No ning: No Treatment Notes Wound #3 (Right, Lateral Lower Leg) 1. Cleanse With Wound Cleanser 2. Periwound Care Moisturizing lotion 3. Primary Dressing Applied Iodoflex 4. Secondary Dressing Dry Gauze 6. Support Layer Holiday representative) Signed: 12/24/2019 4:37:37 PM By: Mikeal Hawthorne EMT/HBOT Signed: 12/24/2019 5:57:16 PM By: Kela Millin Previous Signature: 12/20/2019 5:21:35 PM Version By: Kela Millin Entered By: Mikeal Hawthorne on 12/24/2019 11:26:36 -------------------------------------------------------------------------------- Vitals Details Patient Name: Date of Service: [name]. 12/20/2019 10:30 AM Medical Record [id number] Patient Account Number: [id number] Date of Birth/Sex: 29-Apr-1933 (86 y.o. Female) Treating RN: Kela Millin Primary Care Chalet Kerwin: Lajean Manes T Other Clinician: Referring Marimar Suber: Treating Kati Riggenbach/Extender:Robson, Secundino Ginger, Hal T Weeks in Treatment: 0 Vital Signs Time Taken:  11:35 Temperature (F): 97.6 Height (in): 69 Pulse (bpm): 53 Source: Stated Respiratory Rate (breaths/min): 19 Weight (lbs): 163 Blood Pressure (mmHg): 149/73 Source: Stated Reference Range: 80 - 120 mg / dl Body Mass Index (BMI): 24.1 Electronic Signature(s) Signed: 12/20/2019 5:21:35 PM By: Kela Millin Entered By: Kela Millin on 12/20/2019 11:38:07

## 2019-12-26 ENCOUNTER — Encounter (HOSPITAL_BASED_OUTPATIENT_CLINIC_OR_DEPARTMENT_OTHER): Payer: Medicare Other | Admitting: Physician Assistant

## 2019-12-27 ENCOUNTER — Encounter (HOSPITAL_BASED_OUTPATIENT_CLINIC_OR_DEPARTMENT_OTHER): Payer: Medicare Other | Admitting: Internal Medicine

## 2019-12-28 ENCOUNTER — Other Ambulatory Visit: Payer: Self-pay

## 2019-12-28 ENCOUNTER — Encounter (HOSPITAL_BASED_OUTPATIENT_CLINIC_OR_DEPARTMENT_OTHER): Payer: Medicare Other | Admitting: Internal Medicine

## 2019-12-28 DIAGNOSIS — I87331 Chronic venous hypertension (idiopathic) with ulcer and inflammation of right lower extremity: Secondary | ICD-10-CM | POA: Diagnosis not present

## 2019-12-28 NOTE — Progress Notes (Signed)
Tracy, Green ([id number]) Visit Report for 12/28/2019 Debridement Details Patient Name: Date of Service: [name], SCHNACKENBERG 12/28/2019 9:30 AM Medical Record [id number] Patient Account Number: [id number] Date of Birth/Sex: Treating RN: Dec 05, 1933 (86 y.o. F) Primary Care Provider: Lajean Manes T Other Clinician: Referring Provider: Treating Provider/Extender:Nessa Ramaker, Secundino Ginger, Hal T Weeks in Treatment: 1 Debridement Performed for Wound #3 Right,Lateral Lower Leg Assessment: Performed By: Physician Ricard Dillon., MD Debridement Type: Debridement Severity of Tissue Pre Fat layer exposed Debridement: Level of Consciousness (Pre- Awake and Alert procedure): Pre-procedure Verification/Time Out Taken: Yes - 10:35 Start Time: 10:36 Pain Control: Lidocaine 4% Topical Solution Total Area Debrided (L x W): 0.9 (cm) x 0.8 (cm) = 0.72 (cm) Tissue and other material Viable, Non-Viable, Slough, Subcutaneous, Slough debrided: Level: Skin/Subcutaneous Tissue Debridement Description: Excisional Instrument: Curette Bleeding: Minimum Hemostasis Achieved: Pressure End Time: 10:38 Procedural Pain: 8 Post Procedural Pain: 5 Response to Treatment: Procedure was tolerated well Level of Consciousness Awake and Alert (Post-procedure): Post Debridement Measurements of Total Wound Length: (cm) 0.9 Width: (cm) 0.8 Depth: (cm) 0.1 Volume: (cm) 0.057 Character of Wound/Ulcer Post Improved Debridement: Severity of Tissue Post Debridement: Fat layer exposed Post Procedure Diagnosis Same as Pre-procedure Electronic Signature(s) Signed: 12/28/2019 6:20:19 PM By: Linton Ham MD Entered By: Linton Ham on 12/28/2019 10:44:51 -------------------------------------------------------------------------------- HPI Details Patient Name: Date of Service: [name] 12/28/2019 9:30 AM Medical Record [id number] Patient Account Number: [id number] Date of  Birth/Sex: Treating RN: 03/04/1933 (86 y.o. F) Primary Care Provider: Patrina Levering Other Clinician: Referring Provider: Treating Provider/Extender:Arav Bannister, Secundino Ginger, Hal T Weeks in Treatment: 1 History of Present Illness HPI Description: ADMISSION 12/20/2019 This is an 86 year old woman referred by her primary physician Dr. Felipa Eth for review of a wound on her right lateral lower leg. She was actually in this clinic on 2 separate occasions in 2010 and 2012 cared for by Dr. Sherilyn Cooter. At that point in time she had wounds on her right leg as well. She tells [place] to 1 month ago she noticed a scab building up on her right lateral lower leg this opened into a wound. There was no overt cause of this no trauma, no infection that she is aware of. She has a history of chronic venous insufficiency and wears compression stockings fairly religiously indeed she is done well over the last 8 years since she was last in this clinic. She has been applying Vaseline on this and a covering phone. This is not progressing towards healing. Past medical history; interstitial lung disease, chronic atrial fibrillation status post pacemaker, osteoarthritis of the left knee, left breast CA, chronic repeat venous insufficiency. She takes Eliquis for her atrial fibrillation stroke prophylaxis. ABI in our clinic was 0.99 on the right 1/15; superficial wound on the right lateral calf in the setting of severe acute skin changes from chronic venous insufficiency and lymphedema. We used Iodoflex last week she complained of a lot of pain Electronic Signature(s) Signed: 12/28/2019 6:20:19 PM By: Linton Ham MD Entered By: Linton Ham on 12/28/2019 10:45:23 -------------------------------------------------------------------------------- Physical Exam Details Patient Name: Date of Service: [name] 12/28/2019 9:30 AM Medical Record [id number] Patient Account Number: [id number] Date of  Birth/Sex: Treating RN: 08/21/1933 (86 y.o. F) Primary Care Provider: Patrina Levering Other Clinician: Referring Provider: Treating Provider/Extender:Donnita Farina, Secundino Ginger, Hal T Weeks in Treatment: 1 Constitutional Patient is hypertensive.. Pulse regular and within target range for patient.Marland Kitchen Respirations regular, non-labored and within target range.. Temperature is normal and within the target range for  the patient.Marland Kitchen Appears in no distress. Cardiovascular Pedal pulses are palpable on the right. Notes Wound exam; the area questions a small superficial area however occurring in the middle of very tight adherent fibrotic skin secondary to severe chronic venous insufficiency. She has some degree of lymphedema from the distal third of her calf up to her knee. The area of the wound was still fibrotic I used a #3 curette to debride this. It cleans up really reasonably well. No evidence of surrounding Electronic Signature(s) Signed: 12/28/2019 6:20:19 PM By: Linton Ham MD Entered By: Linton Ham on 12/28/2019 10:46:40 -------------------------------------------------------------------------------- Physician Orders Details Patient Name: Date of Service: [name] 12/28/2019 9:30 AM Medical Record [id number] Patient Account Number: [id number] Date of Birth/Sex: Treating RN: Jun 26, 1933 (86 y.o. [name] Primary Care Provider: Lajean Manes T Other Clinician: Referring Provider: Treating Provider/Extender:Carsten Carstarphen, Secundino Ginger, Hal T Weeks in Treatment: 1 Verbal / Phone Orders: No Diagnosis Coding ICD-10 Coding Code Description I87.321 Chronic venous hypertension (idiopathic) with inflammation of right lower extremity L97.812 Non-pressure chronic ulcer of other part of right lower leg with fat layer exposed Follow-up Appointments Return Appointment in 1 week. Dressing Change Frequency Do not change entire dressing for one week. Skin  Barriers/Peri-Wound Care Moisturizing lotion TCA Cream or Ointment - mixed with lotion to leg Wound Cleansing May shower with protection. - patient to purchase a cast protector. Primary Wound Dressing Wound #3 Right,Lateral Lower Leg Hydrofera Blue - ready Secondary Dressing Wound #3 Right,Lateral Lower Leg Dry Gauze Edema Control Kerlix and Coban - Right Lower Extremity Avoid standing for long periods of time Elevate legs to the level of the heart or above for 30 minutes daily and/or when sitting, a frequency of: - throughout the day. Exercise regularly Patient Medications Allergies: codeine, adhesive, latex, Percodan Notifications Medication Indication Start End lidocaine prior to 12/28/2019 debridement DOSE 1 - topical 4 % cream - 1 cream topical Electronic Signature(s) Signed: 12/28/2019 6:20:19 PM By: Linton Ham MD Signed: 12/28/2019 6:26:16 PM By: [name] RN, BSN Entered By: [name] on 12/28/2019 10:40:56 -------------------------------------------------------------------------------- Problem List Details Patient Name: Date of Service: [name] 12/28/2019 9:30 AM Medical Record [id number] Patient Account Number: [id number] Date of Birth/Sex: Treating RN: May 22, 1933 (86 y.o. F) Primary Care Provider: Patrina Levering Other Clinician: Referring Provider: Treating Provider/Extender:Wiatt Mahabir, Secundino Ginger, Hal T Weeks in Treatment: 1 Active Problems ICD-10 Evaluated Encounter Code Description Active Date Today Diagnosis I87.321 Chronic venous hypertension (idiopathic) with 12/20/2019 No Yes inflammation of right lower extremity L97.812 Non-pressure chronic ulcer of other part of right lower 12/20/2019 No Yes leg with fat layer exposed Inactive Problems Resolved Problems Electronic Signature(s) Signed: 12/28/2019 6:20:19 PM By: Linton Ham MD Entered By: Linton Ham on 12/28/2019  10:44:32 -------------------------------------------------------------------------------- Progress Note Details Patient Name: Date of Service: [name] 12/28/2019 9:30 AM Medical Record [id number] Patient Account Number: [id number] Date of Birth/Sex: Treating RN: May 01, 1933 (86 y.o. F) Primary Care Provider: Patrina Levering Other Clinician: Referring Provider: Treating Provider/Extender:Jolyne Laye, Secundino Ginger, Hal T Weeks in Treatment: 1 Subjective History of Present Illness (HPI) ADMISSION 12/20/2019 This is an 86 year old woman referred by her primary physician Dr. Felipa Eth for review of a wound on her right lateral lower leg. She was actually in this clinic on 2 separate occasions in 2010 and 2012 cared for by Dr. Sherilyn Cooter. At that point in time she had wounds on her right leg as well. She tells [place] to 1 month ago she noticed a scab building up on  her right lateral lower leg this opened into a wound. There was no overt cause of this no trauma, no infection that she is aware of. She has a history of chronic venous insufficiency and wears compression stockings fairly religiously indeed she is done well over the last 8 years since she was last in this clinic. She has been applying Vaseline on this and a covering phone. This is not progressing towards healing. Past medical history; interstitial lung disease, chronic atrial fibrillation status post pacemaker, osteoarthritis of the left knee, left breast CA, chronic repeat venous insufficiency. She takes Eliquis for her atrial fibrillation stroke prophylaxis. ABI in our clinic was 0.99 on the right 1/15; superficial wound on the right lateral calf in the setting of severe acute skin changes from chronic venous insufficiency and lymphedema. We used Iodoflex last week she complained of a lot of pain Objective Constitutional Patient is hypertensive.. Pulse regular and within target range for patient.Marland Kitchen Respirations regular,  non-labored and within target range.. Temperature is normal and within the target range for the patient.Marland Kitchen Appears in no distress. Vitals Time Taken: 10:06 AM, Height: 69 in, Weight: 163 lbs, BMI: 24.1, Temperature: 97.6 F, Pulse: 59 bpm, Respiratory Rate: 19 breaths/min, Blood Pressure: 148/83 mmHg. Cardiovascular Pedal pulses are palpable on the right. General Notes: Wound exam; the area questions a small superficial area however occurring in the middle of very tight adherent fibrotic skin secondary to severe chronic venous insufficiency. She has some degree of lymphedema from the distal third of her calf up to her knee. The area of the wound was still fibrotic I used a #3 curette to debride this. It cleans up really reasonably well. No evidence of surrounding Integumentary (Hair, Skin) Wound #3 status is Open. Original cause of wound was Gradually Appeared. The wound is located on the Right,Lateral Lower Leg. The wound measures 0.9cm length x 0.6cm width x 0.1cm depth; 0.424cm^2 area and 0.042cm^3 volume. There is Fat Layer (Subcutaneous Tissue) Exposed exposed. There is no tunneling or undermining noted. There is a small amount of serosanguineous drainage noted. The wound margin is distinct with the outline attached to the wound base. There is small (1-33%) pink granulation within the wound bed. There is a large (67-100%) amount of necrotic tissue within the wound bed including Adherent Slough. Assessment Active Problems ICD-10 Chronic venous hypertension (idiopathic) with inflammation of right lower extremity Non-pressure chronic ulcer of other part of right lower leg with fat layer exposed Procedures Wound #3 Pre-procedure diagnosis of Wound #3 is a Venous Leg Ulcer located on the Right,Lateral Lower Leg .Severity of Tissue Pre Debridement is: Fat layer exposed. There was a Excisional Skin/Subcutaneous Tissue Debridement with a total area of 0.72 sq cm performed by Ricard Dillon., MD. With the following instrument(s): Curette to remove Viable and Non-Viable tissue/material. Material removed includes Subcutaneous Tissue and Slough and after achieving pain control using Lidocaine 4% Topical Solution. No specimens were taken. A time out was conducted at 10:35, prior to the start of the procedure. A Minimum amount of bleeding was controlled with Pressure. The procedure was tolerated well with a pain level of 8 throughout and a pain level of 5 following the procedure. Post Debridement Measurements: 0.9cm length x 0.8cm width x 0.1cm depth; 0.057cm^3 volume. Character of Wound/Ulcer Post Debridement is improved. Severity of Tissue Post Debridement is: Fat layer exposed. Post procedure Diagnosis Wound #3: Same as Pre-Procedure Plan Follow-up Appointments: Return Appointment in 1 week. Dressing Change Frequency: Do not change  entire dressing for one week. Skin Barriers/Peri-Wound Care: Moisturizing lotion TCA Cream or Ointment - mixed with lotion to leg Wound Cleansing: May shower with protection. - patient to purchase a cast protector. Primary Wound Dressing: Wound #3 Right,Lateral Lower Leg: Hydrofera Blue - ready Secondary Dressing: Wound #3 Right,Lateral Lower Leg: Dry Gauze Edema Control: Kerlix and Coban - Right Lower Extremity Avoid standing for long periods of time Elevate legs to the level of the heart or above for 30 minutes daily and/or when sitting, a frequency of: - throughout the day. Exercise regularly The following medication(s) was prescribed: lidocaine topical 4 % cream 1 1 cream topical for prior to debridement was prescribed at facility 1. Hydrofera Blue ready 2. Hopefully changing from the Iodoflex will make her pain easier. 3. We are using kerlix and Coban which seems to be controlling her edema 4. She is going to need more aggressive compression than she is used to when this heals either 20/30 below-knee or perhaps juxta lite stockings  and I initiated this discussion with her today Electronic Signature(s) Signed: 12/28/2019 6:20:19 PM By: Linton Ham MD Entered By: Linton Ham on 12/28/2019 10:47:46 -------------------------------------------------------------------------------- SuperBill Details Patient Name: Date of Service: [name] 12/28/2019 Medical Record [id number] Patient Account Number: [id number] Date of Birth/Sex: Treating RN: 03-Jun-1933 (86 y.o. [name] Primary Care Provider: Lajean Manes T Other Clinician: Referring Provider: Treating Provider/Extender:Aiva Miskell, Secundino Ginger, Hal T Weeks in Treatment: 1 Diagnosis Coding ICD-10 Codes Code Description [id number] Chronic venous hypertension (idiopathic) with inflammation of right lower extremity L97.812 Non-pressure chronic ulcer of other part of right lower leg with fat layer exposed Facility Procedures Physician Procedures CPT4 Code Description: [id number] - WC PHYS SUBQ TISS 20 SQ CM ICD-10 Diagnosis Description [id number] Non-pressure chronic ulcer of other part of right lower l Modifier: eg with fat lay Quantity: 1 er exposed Electronic Signature(s) Signed: 12/28/2019 6:20:19 PM By: Linton Ham MD Entered By: Linton Ham on 12/28/2019 10:47:55

## 2019-12-29 NOTE — Progress Notes (Signed)
PPM remote 

## 2020-01-04 ENCOUNTER — Encounter (HOSPITAL_BASED_OUTPATIENT_CLINIC_OR_DEPARTMENT_OTHER): Payer: Medicare Other | Admitting: Internal Medicine

## 2020-01-04 ENCOUNTER — Other Ambulatory Visit: Payer: Self-pay

## 2020-01-04 DIAGNOSIS — I87331 Chronic venous hypertension (idiopathic) with ulcer and inflammation of right lower extremity: Secondary | ICD-10-CM | POA: Diagnosis not present

## 2020-01-04 NOTE — Progress Notes (Signed)
[name], [name] ([id number]) Visit Report for 01/04/2020 Arrival Information Details Patient Name: Date of Service: [name], [name] 01/04/2020 9:30 AM Medical Record [id number] Patient Account Number: [id number] Date of Birth/Sex: Treating RN: 08-18-1933 (86 y.o. Martyn Malay, Linda Primary Care Ganesh Deeg: Lajean Manes T Other Clinician: Referring Jenia Klepper: Treating Roman Sandall/Extender:Robson, Secundino Ginger, Hal T Weeks in Treatment: 2 Visit Information History Since Last Visit Added or deleted any medications: No Patient Arrived: Ambulatory Any new allergies or adverse reactions: No Arrival Time: 09:48 Had a fall or experienced change in No Accompanied By: self activities of daily living that may affect Transfer Assistance: None risk of falls: Patient Identification Verified: Yes Signs or symptoms of abuse/neglect since last No Secondary Verification Process Yes visito Completed: Hospitalized since last visit: No Patient Requires Transmission-Based No Implantable device outside of the clinic excluding No Precautions: cellular tissue based products placed in the center Patient Has Alerts: Yes since last visit: Patient Alerts: Patient on Blood Has Dressing in Place as Prescribed: Yes Thinner Has Compression in Place as Prescribed: Yes Right ABI:0.99 Pain Present Now: Yes Electronic Signature(s) Signed: 01/04/2020 4:58:06 PM By: [name] RN, BSN Entered By: [name] on 01/04/2020 09:52:20 -------------------------------------------------------------------------------- Compression Therapy Details Patient Name: Date of Service: [name] 01/04/2020 9:30 AM Medical Record [id number] Patient Account Number: [id number] Date of Birth/Sex: Treating RN: Sep 29, 1933 (86 y.o. [name] Primary Care Nacole Fluhr: Patrina Levering Other Clinician: Referring Toniann Dickerson: Treating Annette Liotta/Extender:Robson, Secundino Ginger, Hal T Weeks in  Treatment: 2 Compression Therapy Performed for Wound Wound #3 Right,Lateral Lower Leg Assessment: Performed By: Clinician Deon Pilling, RN Compression Type: Three Layer Post Procedure Diagnosis Same as Pre-procedure Electronic Signature(s) Signed: 01/04/2020 5:39:40 PM By: Kela Millin Entered By: Kela Millin on 01/04/2020 10:12:53 -------------------------------------------------------------------------------- Encounter Discharge Information Details Patient Name: Date of Service: [name] 01/04/2020 9:30 AM Medical Record [id number] Patient Account Number: [id number] Date of Birth/Sex: Treating RN: 04-05-1933 (86 y.o. [name] Primary Care Cincere Deprey: Patrina Levering Other Clinician: Referring Gemma Ruan: Treating Angala Hilgers/Extender:Robson, Secundino Ginger, Hal T Weeks in Treatment: 2 Encounter Discharge Information Items Post Procedure Vitals Discharge Condition: Stable Temperature (F): 97.7 Ambulatory Status: Ambulatory Pulse (bpm): 60 Discharge Destination: Home Respiratory Rate (breaths/min): 18 Transportation: Private Auto Blood Pressure (mmHg): 126/72 Accompanied By: self Schedule Follow-up Appointment: Yes Clinical Summary of Care: Electronic Signature(s) Signed: 01/04/2020 5:44:09 PM By: Deon Pilling Entered By: Deon Pilling on 01/04/2020 10:35:21 -------------------------------------------------------------------------------- Lower Extremity Assessment Details Patient Name: Date of Service: [name], [name] 01/04/2020 9:30 AM Medical Record [id number] Patient Account Number: [id number] Date of Birth/Sex: Treating RN: 08/20/1933 (86 y.o. [name] Primary Care Nedda Gains: Lajean Manes T Other Clinician: Referring Izek Corvino: Treating Janeil Schexnayder/Extender:Robson, Secundino Ginger, Hal T Weeks in Treatment: 2 Edema Assessment Assessed: [Left: No] [Right: No] Edema: [Left: N] [Right: o] Calf Left: Right: Point of  Measurement: 42 cm From Medial Instep cm 32.3 cm Ankle Left: Right: Point of Measurement: 13 cm From Medial Instep cm 20 cm Vascular Assessment Pulses: Dorsalis Pedis Palpable: [Right:Yes] Electronic Signature(s) Signed: 01/04/2020 4:58:06 PM By: [name] RN, BSN Entered By: [name] on 01/04/2020 10:02:04 -------------------------------------------------------------------------------- Multi Wound Chart Details Patient Name: Date of Service: [name] 01/04/2020 9:30 AM Medical Record [id number] Patient Account Number: [id number] Date of Birth/Sex: Treating RN: Oct 26, 1933 (86 y.o. F) Primary Care Dallan Schonberg: Patrina Levering Other Clinician: Referring Kali Ambler: Treating Pedro Oldenburg/Extender:Robson, Secundino Ginger, Hal T Weeks in Treatment: 2 Vital Signs Height(in): 69 Pulse(bpm): 69 Weight(lbs): 163 Blood Pressure(mmHg): 126/72 Body Mass  Index(BMI): 24 Temperature(F): 97.7 Respiratory 18 Rate(breaths/min): Photos: [3:No Photos] [N/A:N/A] Wound Location: [3:Right Lower Leg - Lateral N/A] Wounding Event: [3:Gradually Appeared] [N/A:N/A] Primary Etiology: [3:Venous Leg Ulcer] [N/A:N/A] Comorbid History: [3:Cataracts, Anemia, Arrhythmia, Congestive Heart Failure, Hypertension, Peripheral Venous Disease, Received Radiation] [N/A:N/A] Date Acquired: [3:11/19/2019] [N/A:N/A] Weeks of Treatment: [3:2] [N/A:N/A] Wound Status: [3:Open] [N/A:N/A] Measurements L x W x D 1x0.9x0.1 [N/A:N/A] (cm) Area (cm) : [3:0.707] [N/A:N/A] Volume (cm) : [3:0.071] [N/A:N/A] % Reduction in Area: [3:-28.50%] [N/A:N/A N/A] % Reduction in Volume: [3:-29.10%] [N/A:N/A N/A] Classification: [3:Full Thickness Without Exposed Support Structures] [N/A:N/A N/A] Exudate Amount: [3:Small] [N/A:N/A N/A] Exudate Type: [3:Serosanguineous] [N/A:N/A N/A] Exudate Color: [3:red, brown] [N/A:N/A N/A] Wound Margin: [3:Indistinct, nonvisible] [N/A:N/A N/A] Granulation Amount: [3:Medium  (34-66%)] [N/A:N/A N/A] Granulation Quality: [3:Pink] [N/A:N/A N/A] Necrotic Amount: [3:Medium (34-66%)] [N/A:N/A N/A] Exposed Structures: [3:Fat Layer (Subcutaneous N/A Tissue) Exposed: Yes Fascia: No Tendon: No Muscle: No Joint: No Bone: No] [N/A:N/A] Epithelialization: [3:Small (1-33%)] [N/A:N/A N/A] Debridement: [3:Debridement - Excisional N/A] [N/A:N/A] Pre-procedure [3:10:10] [N/A:N/A N/A] Verification/Time Out Taken: Pain Control: [3:Other] [N/A:N/A N/A] Tissue Debrided: [3:Subcutaneous, Slough] [N/A:N/A N/A] Level: [3:Skin/Subcutaneous Tissue] [N/A:N/A N/A] Debridement Area (sq cm):0.9 [N/A:N/A N/A] Instrument: [3:Curette] [N/A:N/A N/A] Bleeding: [3:Minimum] [N/A:N/A N/A] Hemostasis Achieved: [3:Pressure] [N/A:N/A N/A] Procedural Pain: [3:2] [N/A:N/A N/A] Post Procedural Pain: [3:0] [N/A:N/A N/A] Debridement Treatment Procedure was tolerated [N/A:N/A N/A] Response: [3:well] Post Debridement [3:1x0.9x0.1] [N/A:N/A N/A] Measurements L x W x D (cm) Post Debridement [3:0.071] [N/A:N/A N/A] Volume: (cm) Procedures Performed: Compression Therapy [3:Debridement] [N/A:N/A N/A] Treatment Notes Wound #3 (Right, Lateral Lower Leg) 1. Cleanse With Wound Cleanser Soap and water 2. Periwound Care Moisturizing lotion TCA Cream 3. Primary Dressing Applied Hydrofera Blue 4. Secondary Dressing Dry Gauze 6. Support Layer Applied 3 layer compression wrap Notes netting. Electronic Signature(s) Signed: 01/04/2020 5:37:40 PM By: Linton Ham MD Entered By: Linton Ham on 01/04/2020 10:38:58 -------------------------------------------------------------------------------- Multi-Disciplinary Care Plan Details Patient Name: Date of Service: [name] 01/04/2020 9:30 AM Medical Record [id number] Patient Account Number: [id number] Date of Birth/Sex: Treating RN: [birth year] (86 y.o. [name] Primary Care Taeshawn Helfman: Patrina Levering Other  Clinician: Referring Henry Utsey: Treating Chalese Peach/Extender:Robson, Secundino Ginger, Hal T Weeks in Treatment: 2 Active Inactive Abuse / Safety / Falls / Self Care Management Nursing Diagnoses: Potential for falls Goals: Patient/caregiver will verbalize understanding of skin care regimen Date Initiated: 12/20/2019 Target Resolution Date: 01/18/2020 Goal Status: Active Patient/caregiver will verbalize/demonstrate understanding of what to do in case of emergency Date Initiated: 12/20/2019 Target Resolution Date: 01/18/2020 Goal Status: Active Interventions: Assess fall risk on admission and as needed Provide education on fall prevention Notes: Pain, Acute or Chronic Nursing Diagnoses: Pain, acute or chronic: actual or potential Potential alteration in comfort, pain Goals: Patient will verbalize adequate pain control and receive pain control interventions during procedures as needed Date Initiated: 12/20/2019 Target Resolution Date: 01/18/2020 Goal Status: Active Patient/caregiver will verbalize comfort level met Date Initiated: 12/20/2019 Target Resolution Date: 01/18/2020 Goal Status: Active Interventions: Complete pain assessment as per visit requirements Provide education on pain management Notes: Wound/Skin Impairment Nursing Diagnoses: Knowledge deficit related to ulceration/compromised skin integrity Goals: Patient/caregiver will verbalize understanding of skin care regimen Date Initiated: 12/20/2019 Target Resolution Date: 01/11/2020 Goal Status: Active Interventions: Assess patient/caregiver ability to perform ulcer/skin care regimen upon admission and as needed Provide education on ulcer and skin care Treatment Activities: Skin care regimen initiated : 12/20/2019 Topical wound management initiated : 12/20/2019 Notes: Electronic Signature(s) Signed: 01/04/2020 5:39:40 PM By: Kela Millin Entered By: Kela Millin  on 01/04/2020  09:53:55 -------------------------------------------------------------------------------- Pain Assessment Details Patient Name: Date of Service: [name], [name] 01/04/2020 9:30 AM Medical Record [id number] Patient Account Number: [id number] Date of Birth/Sex: Treating RN: Dec 28, 1932 (86 y.o. [name] Primary Care Elmor Kost: Lajean Manes T Other Clinician: Referring Ermel Verne: Treating Chancie Lampert/Extender:Robson, Secundino Ginger, Hal T Weeks in Treatment: 2 Active Problems Location of Pain Severity and Description of Pain Patient Has Paino Yes Site Locations Pain Location: Pain in Ulcers With Dressing Change: Yes Duration of the Pain. Constant / Intermittento Intermittent Rate the pain. Current Pain Level: 3 Least Pain Level: 0 Character of Pain Describe the Pain: Tender, Other: sore Pain Management and Medication Current Pain Management: Medication: Yes Is the Current Pain Management Adequate: Adequate Rest: Yes How does your wound impact your activities of daily livingo Sleep: No Bathing: No Appetite: No Relationship With Others: No Bladder Continence: No Emotions: No Bowel Continence: No Hobbies: Yes Toileting: No Dressing: No Electronic Signature(s) Signed: 01/04/2020 4:58:06 PM By: [name] RN, BSN Entered By: [name] on 01/04/2020 09:54:15 -------------------------------------------------------------------------------- Patient/Caregiver Education Details Patient Name: Date of Service: [name] 1/22/2021andnbsp9:30 AM Medical Record [phone] Patient Account Number: [id number] Date of Birth/Gender: Nov 29, 1933 (86 y.o. F) Treating RN: Kela Millin Primary Care Physician: Patrina Levering Other Clinician: Referring Physician: Treating Physician/Extender:Robson, Secundino Ginger, Junius Creamer in Treatment: 2 Education Assessment Education Provided To: Patient Education Topics Provided Pain: Methods:  Explain/Verbal Responses: State content correctly Safety: Methods: Explain/Verbal Responses: State content correctly Wound/Skin Impairment: Methods: Explain/Verbal Responses: State content correctly Electronic Signature(s) Signed: 01/04/2020 5:39:40 PM By: Kela Millin Entered By: Kela Millin on 01/04/2020 09:54:31 -------------------------------------------------------------------------------- Wound Assessment Details Patient Name: Date of Service: [name] 01/04/2020 9:30 AM Medical Record [id number] Patient Account Number: [id number] Date of Birth/Sex: Treating RN: 11/28/1933 (86 y.o. [name] Primary Care Wesson Stith: Lajean Manes T Other Clinician: Referring Rosendo Couser: Treating Moishe Schellenberg/Extender:Robson, Secundino Ginger, Hal T Weeks in Treatment: 2 Wound Status Wound Number: 3 Primary Venous Leg Ulcer Etiology: Wound Location: Right Lower Leg - Lateral Wound Open Wounding Event: Gradually Appeared Status: Date Acquired: 11/19/2019 Comorbid Cataracts, Anemia, Arrhythmia, Congestive Weeks Of Treatment: 2 History: Heart Failure, Hypertension, Peripheral Clustered Wound: No Venous Disease, Received Radiation Photos Wound Measurements Length: (cm) 1 % Reduct Width: (cm) 0.9 % Reduct Depth: (cm) 0.1 Epitheli Area: (cm) 0.707 Tunneli Volume: (cm) 0.071 Undermi Wound Description Full Thickness Without Exposed Support Foul Od Classification: Structures Slough/ Wound Indistinct, nonvisible Margin: Exudate Small Amount: Exudate Serosanguineous Type: Exudate red, brown Color: Wound Bed Granulation Amount: Medium (34-66%) Granulation Quality: Pink Fascia E Necrotic Amount: Medium (34-66%) Fat Laye Necrotic Quality: Adherent Slough Tendon E Muscle E Joint Ex Bone Exp or After Cleansing: No Fibrino Yes Exposed Structure xposed: No r (Subcutaneous Tissue) Exposed: Yes xposed: No xposed: No posed: No osed: No ion in Area:  -28.5% ion in Volume: -29.1% alization: Small (1-33%) ng: No ning: No Treatment Notes Wound #3 (Right, Lateral Lower Leg) 1. Cleanse With Wound Cleanser Soap and water 2. Periwound Care Moisturizing lotion TCA Cream 3. Primary Dressing Applied Hydrofera Blue 4. Secondary Dressing Dry Gauze 6. Support Layer Applied 3 layer compression wrap Notes netting. Electronic Signature(s) Signed: 01/04/2020 4:58:06 PM By: [name] RN, BSN Signed: 01/04/2020 5:14:05 PM By: Mikeal Hawthorne EMT/HBOT Entered By: Mikeal Hawthorne on 01/04/2020 13:06:21 -------------------------------------------------------------------------------- Vitals Details Patient Name: Date of Service: [name] 01/04/2020 9:30 AM Medical Record [id number] Patient Account Number: [id number] Date of Birth/Sex: Treating RN: 03-Nov-1933 (86 y.o. F)  [name] Primary Care Khanh Cordner: Lajean Manes T Other Clinician: Referring Quintina Hakeem: Treating Merlen Gurry/Extender:Robson, Secundino Ginger, Hal T Weeks in Treatment: 2 Vital Signs Time Taken: 09:52 Temperature (F): 97.7 Height (in): 69 Pulse (bpm): 69 Source: Stated Respiratory Rate (breaths/min): 18 Weight (lbs): 163 Blood Pressure (mmHg): 126/72 Source: Stated Reference Range: 80 - 120 mg / dl Body Mass Index (BMI): 24.1 Electronic Signature(s) Signed: 01/04/2020 4:58:06 PM By: [name] RN, BSN Entered By: [name] on 01/04/2020 09:54:28

## 2020-01-04 NOTE — Progress Notes (Signed)
[name] ([id number]) Visit Report for 01/04/2020 Debridement Details Patient Name: Date of Service: [name] 01/04/2020 9:30 AM Medical Record [id number] Patient Account Number: [id number] Date of Birth/Sex: Treating RN: [birth year] (86 y.o. F) Primary Care Provider: Lajean Manes Green Other Clinician: Referring Provider: Treating Provider/Extender:[name], [name], [name] in Treatment: 2 Debridement Performed for Wound #3 Right,Lateral Lower Leg Assessment: Performed By: Physician [name]., MD Debridement Type: Debridement Severity of Tissue Pre Fat layer exposed Debridement: Level of Consciousness (Pre- Awake and Alert procedure): Pre-procedure Verification/Time Out Taken: Yes - 10:10 Start Time: 10:10 Pain Control: Other : benzocaine, 20% Total Area Debrided (L x W): 1 (cm) x 0.9 (cm) = 0.9 (cm) Tissue and other material Viable, Non-Viable, Slough, Subcutaneous, Slough debrided: Level: Skin/Subcutaneous Tissue Debridement Description: Excisional Instrument: Curette Bleeding: Minimum Hemostasis Achieved: Pressure End Time: 10:11 Procedural Pain: 2 Post Procedural Pain: 0 Response to Treatment: Procedure was tolerated well Level of Consciousness Awake and Alert (Post-procedure): Post Debridement Measurements of Total Wound Length: (cm) 1 Width: (cm) 0.9 Depth: (cm) 0.1 Volume: (cm) 0.071 Character of Wound/Ulcer Post Improved Debridement: Severity of Tissue Post Debridement: Fat layer exposed Post Procedure Diagnosis Same as Pre-procedure Electronic Signature(s) Signed: 01/04/2020 5:37:40 PM By: Tracy Ham MD Entered By: [name] on 01/04/2020 10:39:43 -------------------------------------------------------------------------------- HPI Details Patient Name: Date of Service: [name] 01/04/2020 9:30 AM Medical Record [id number] Patient Account Number: [id number] Date of Birth/Sex: Treating  RN: 15-Jun-1933 (86 y.o. F) Primary Care Provider: Patrina Green Other Clinician: Referring Provider: Treating Provider/Extender:[name], [name], [name] in Treatment: 2 History of Present Illness HPI Description: ADMISSION 12/20/2019 This is an 86 year old woman referred by her primary physician [name] for review of a wound on her right lateral lower leg. She was actually in this clinic on 2 separate occasions in 2010 and 2012 cared for by Dr. Sherilyn Green. At that point in time she had wounds on her right leg as well. She tells [place] to 1 month ago she noticed a scab building up on her right lateral lower leg this opened into a wound. There was no overt cause of this no trauma, no infection that she is aware of. She has a history of chronic venous insufficiency and wears compression stockings fairly religiously indeed she is done well over the last 8 years since she was last in this clinic. She has been applying Vaseline on this and a covering phone. This is not progressing towards healing. Past medical history; interstitial lung disease, chronic atrial fibrillation status post pacemaker, osteoarthritis of the left knee, left breast CA, chronic repeat venous insufficiency. She takes Eliquis for her atrial fibrillation stroke prophylaxis. ABI in our clinic was 0.99 on the right 1/15; superficial wound on the right lateral calf in the setting of severe acute skin changes from chronic venous insufficiency and lymphedema. We used Iodoflex last week she complained of a lot of pain 1/22; this is a small but difficult wound on the right lateral calf in the setting of severe chronic venous insufficiency and secondary lymphedema. She continues to state the wounds things hurts when she is up on it but seems to be relieved by putting her leg up. I changed her to [facility] last week because the Iodoflex seem to be causing stinging. Electronic Signature(s) Signed: 01/04/2020 5:37:40  PM By: Tracy Ham MD Entered By: [name] on 01/04/2020 10:41:11 -------------------------------------------------------------------------------- Physical Exam Details Patient Name: Date of Service: [name]. 01/04/2020 9:30 AM  Medical Record [id number] Patient Account Number: [id number] Date of Birth/Sex: Treating RN: [birth year] (86 y.o. F) Primary Care Provider: Other Clinician: Lajean Manes Green Referring Provider: Treating Provider/Extender:[name], [name], [name] in Treatment: 2 Notes Wound exam; the area in question is a small superficial area however still at least 60% of it is covered in a very fibrinous difficult to debride debris. Using a #3 curette I removed tightly adherent debris. I also paid some attention to the skin at the circumference of the wound trying to get a better surface for epithelialization. There is no evidence of Electronic Signature(s) Signed: 01/04/2020 5:37:40 PM By: Tracy Ham MD Entered By: [name] on 01/04/2020 10:44:06 -------------------------------------------------------------------------------- Physician Orders Details Patient Name: Date of Service: [name] 01/04/2020 9:30 AM Medical Record [id number] Patient Account Number: [id number] Date of Birth/Sex: Treating RN: 11/03/1933 (86 y.o. [name] Primary Care Provider: Lajean Manes Green Other Clinician: Referring Provider: Treating Provider/Extender:Payeton Germani, [name], [name] in Treatment: 2 Verbal / Phone Orders: No Diagnosis Coding ICD-10 Coding Code Description I87.321 Chronic venous hypertension (idiopathic) with inflammation of right lower extremity L97.812 Non-pressure chronic ulcer of other part of right lower leg with fat layer exposed Follow-up Appointments Return Appointment in 1 week. - Friday Dressing Change Frequency Do not change entire dressing for one week. Skin Barriers/Peri-Wound  Care Moisturizing lotion TCA Cream or Ointment - mixed with lotion to leg Wound Cleansing May shower with protection. - cast protector Primary Wound Dressing Wound #3 Right,Lateral Lower Leg Hydrofera Blue - ready Secondary Dressing Wound #3 Right,Lateral Lower Leg Dry Gauze Edema Control 3 Layer Compression System - Right Lower Extremity Avoid standing for long periods of time Elevate legs to the level of the heart or above for 30 minutes daily and/or when sitting, a frequency of: - throughout the day. Exercise regularly Electronic Signature(s) Signed: 01/04/2020 5:37:40 PM By: Tracy Ham MD Signed: 01/04/2020 5:39:40 PM By: Kela Millin Entered By: Kela Millin on 01/04/2020 10:14:07 -------------------------------------------------------------------------------- Problem List Details Patient Name: Date of Service: [name] 01/04/2020 9:30 AM Medical Record [id number] Patient Account Number: [id number] Date of Birth/Sex: Treating RN: [birth year] (86 y.o. [name] Primary Care Provider: Patrina Green Other Clinician: Referring Provider: Treating Provider/Extender:Jacari Kirsten, [name], Junius Creamer in Treatment: 2 Active Problems ICD-10 Evaluated Encounter Code Description Active Date Today Diagnosis I87.321 Chronic venous hypertension (idiopathic) with 12/20/2019 No Yes inflammation of right lower extremity L97.812 Non-pressure chronic ulcer of other part of right lower 12/20/2019 No Yes leg with fat layer exposed Inactive Problems Resolved Problems Electronic Signature(s) Signed: 01/04/2020 5:37:40 PM By: Tracy Ham MD Entered By: [name] on 01/04/2020 10:38:24 -------------------------------------------------------------------------------- Progress Note Details Patient Name: Date of Service: [name] 01/04/2020 9:30 AM Medical Record [id number] Patient Account Number: [id number] Date of  Birth/Sex: Treating RN: February 13, 1933 (86 y.o. F) Primary Care Provider: Patrina Green Other Clinician: Referring Provider: Treating Provider/Extender:Ramah Langhans, [name], [name] in Treatment: 2 Subjective History of Present Illness (HPI) ADMISSION 12/20/2019 This is an 86 year old woman referred by her primary physician [name] for review of a wound on her right lateral lower leg. She was actually in this clinic on 2 separate occasions in 2010 and 2012 cared for by Dr. Sherilyn Green. At that point in time she had wounds on her right leg as well. She tells [place] to 1 month ago she noticed a scab building up on her right lateral lower leg this opened into a wound. There was  no overt cause of this no trauma, no infection that she is aware of. She has a history of chronic venous insufficiency and wears compression stockings fairly religiously indeed she is done well over the last 8 years since she was last in this clinic. She has been applying Vaseline on this and a covering phone. This is not progressing towards healing. Past medical history; interstitial lung disease, chronic atrial fibrillation status post pacemaker, osteoarthritis of the left knee, left breast CA, chronic repeat venous insufficiency. She takes Eliquis for her atrial fibrillation stroke prophylaxis. ABI in our clinic was 0.99 on the right 1/15; superficial wound on the right lateral calf in the setting of severe acute skin changes from chronic venous insufficiency and lymphedema. We used Iodoflex last week she complained of a lot of pain 1/22; this is a small but difficult wound on the right lateral calf in the setting of severe chronic venous insufficiency and secondary lymphedema. She continues to state the wounds things hurts when she is up on it but seems to be relieved by putting her leg up. I changed her to [facility] last week because the Iodoflex seem to be  causing stinging. Objective Constitutional Vitals Time Taken: 9:52 AM, Height: 69 in, Source: Stated, Weight: 163 lbs, Source: Stated, BMI: 24.1, Temperature: 97.7 F, Pulse: 69 bpm, Respiratory Rate: 18 breaths/min, Blood Pressure: 126/72 mmHg. Integumentary (Hair, Skin) Wound #3 status is Open. Original cause of wound was Gradually Appeared. The wound is located on the Right,Lateral Lower Leg. The wound measures 1cm length x 0.9cm width x 0.1cm depth; 0.707cm^2 area and 0.071cm^3 volume. There is Fat Layer (Subcutaneous Tissue) Exposed exposed. There is no tunneling or undermining noted. There is a small amount of serosanguineous drainage noted. The wound margin is indistinct and nonvisible. There is medium (34-66%) pink granulation within the wound bed. There is a medium (34-66%) amount of necrotic tissue within the wound bed including Adherent Slough. Assessment Active Problems ICD-10 Chronic venous hypertension (idiopathic) with inflammation of right lower extremity Non-pressure chronic ulcer of other part of right lower leg with fat layer exposed Procedures Wound #3 Pre-procedure diagnosis of Wound #3 is a Venous Leg Ulcer located on the Right,Lateral Lower Leg .Severity of Tissue Pre Debridement is: Fat layer exposed. There was a Excisional Skin/Subcutaneous Tissue Debridement with a total area of 0.9 sq cm performed by [name]., MD. With the following instrument(s): Curette to remove Viable and Non-Viable tissue/material. Material removed includes Subcutaneous Tissue and Slough and after achieving pain control using Other (benzocaine, 20%). No specimens were taken. A time out was conducted at 10:10, prior to the start of the procedure. A Minimum amount of bleeding was controlled with Pressure. The procedure was tolerated well with a pain level of 2 throughout and a pain level of 0 following the procedure. Post Debridement Measurements: 1cm length x 0.9cm width x 0.1cm  depth; 0.071cm^3 volume. Character of Wound/Ulcer Post Debridement is improved. Severity of Tissue Post Debridement is: Fat layer exposed. Post procedure Diagnosis Wound #3: Same as Pre-Procedure Pre-procedure diagnosis of Wound #3 is a Venous Leg Ulcer located on the Right,Lateral Lower Leg . There was a Three Layer Compression Therapy Procedure by Deon Pilling, RN. Post procedure Diagnosis Wound #3: Same as Pre-Procedure Plan Follow-up Appointments: Return Appointment in 1 week. - Friday Dressing Change Frequency: Do not change entire dressing for one week. Skin Barriers/Peri-Wound Care: Moisturizing lotion TCA Cream or Ointment - mixed with lotion to leg Wound Cleansing: May shower  with protection. - cast protector Primary Wound Dressing: Wound #3 Right,Lateral Lower Leg: Hydrofera Blue - ready Secondary Dressing: Wound #3 Right,Lateral Lower Leg: Dry Gauze Edema Control: 3 Layer Compression System - Right Lower Extremity Avoid standing for long periods of time Elevate legs to the level of the heart or above for 30 minutes daily and/or when sitting, a frequency of: - throughout the day. Exercise regularly 1. I continue with Hydrofera Blue 2. I put her up to 3 layer compression. She has lymphedema with tightly adherent fibrotic skin in the distal one third hope to control this Electronic Signature(s) Signed: 01/04/2020 5:37:40 PM By: Tracy Ham MD Entered By: [name] on 01/04/2020 10:44:43 -------------------------------------------------------------------------------- SuperBill Details Patient Name: Date of Service: [name] 01/04/2020 Medical Record [id number] Patient Account Number: [id number] Date of Birth/Sex: Treating RN: 16-Dec-1932 (86 y.o. [name] Primary Care Provider: Lajean Manes Green Other Clinician: Referring Provider: Treating Provider/Extender:Byanca Kasper, [name], [name] in Treatment: 2 Diagnosis  Coding ICD-10 Codes Code Description [phone] Chronic venous hypertension (idiopathic) with inflammation of right lower extremity L97.812 Non-pressure chronic ulcer of other part of right lower leg with fat layer exposed Facility Procedures The patient participates with Medicare or their insurance follows the Medicare Facility Guidelines: CPT4 Code Description Modifier Quantity [id number] - DEB SUBQ TISSUE 20 SQ CM/< 1 ICD-10 Diagnosis Description [id number] Non-pressure chronic ulcer of  other part of right lower leg with fat layer exposed Physician Procedures CPT4 Code Description: [id number] - WC PHYS SUBQ TISS 20 SQ CM ICD-10 Diagnosis Description [id number] Non-pressure chronic ulcer of other part of right lower leg Modifier: with fat layer Quantity: 1 exposed Electronic Signature(s) Signed: 01/04/2020 5:37:40 PM By: Tracy Ham MD Entered By: [name] on 01/04/2020 10:45:04

## 2020-01-10 ENCOUNTER — Ambulatory Visit: Payer: Medicare Other | Admitting: Internal Medicine

## 2020-01-10 ENCOUNTER — Other Ambulatory Visit: Payer: Self-pay

## 2020-01-10 ENCOUNTER — Encounter: Payer: Self-pay | Admitting: Internal Medicine

## 2020-01-10 VITALS — BP 122/74 | HR 81 | Ht 69.0 in | Wt 168.0 lb

## 2020-01-10 DIAGNOSIS — I5032 Chronic diastolic (congestive) heart failure: Secondary | ICD-10-CM

## 2020-01-10 DIAGNOSIS — I482 Chronic atrial fibrillation, unspecified: Secondary | ICD-10-CM

## 2020-01-10 DIAGNOSIS — Z950 Presence of cardiac pacemaker: Secondary | ICD-10-CM | POA: Diagnosis not present

## 2020-01-10 LAB — CUP PACEART INCLINIC DEVICE CHECK
Date Time Interrogation Session: 20210128113003
Implantable Lead Implant Date: 20180905
Implantable Lead Implant Date: 20180905
Implantable Lead Location: 753859
Implantable Lead Location: 753860
Implantable Lead Model: 3830
Implantable Lead Model: 5076
Implantable Pulse Generator Implant Date: 20180905

## 2020-01-10 NOTE — Patient Instructions (Signed)
Medication Instructions:  Your physician recommends that you continue on your current medications as directed. Please refer to the Current Medication list given to you today.  Labwork: None ordered.  Testing/Procedures: None ordered.  Follow-Up: Your physician wants you to follow-up in: one year with Dr. Lovena Le.   You will receive a reminder letter in the mail two months in advance. If you don't receive a letter, please call our office to schedule the follow-up appointment.  Remote monitoring is used to monitor your Pacemaker from home. This monitoring reduces the number of office visits required to check your device to one time per year. It allows [place] to keep an eye on the functioning of your device to ensure it is working properly. You are scheduled for a device check from home on 02/26/2020. You may send your transmission at any time that day. If you have a wireless device, the transmission will be sent automatically.    Any Other Special Instructions Will Be Listed Below (If Applicable).  If you need a refill on your cardiac medications before your next appointment, please call your pharmacy.

## 2020-01-10 NOTE — Progress Notes (Signed)
HPI [name] returns today for followup. She is a pleasant 86 yo woman with persistent atrial fib, CHB, s/p PPM insertion and diastolic heart failure. She has developed some problems with venous insufficiency. She denies chest pain or sob but is quite sedentary. No chest pain or sob.  Allergies  Allergen Reactions  . Adhesive [Tape] Rash    Including EKG electrodes and the like.  . Codeine Other (See Comments)    unknown  . Latex Itching  . Percodan [Oxycodone-Aspirin] Nausea And Vomiting    Nausea      Current Outpatient Medications  Medication Sig Dispense Refill  . antiseptic oral rinse (BIOTENE) LIQD 15 mLs by Mouth Rinse route as needed for dry mouth.    . Carboxymethylcellul-Glycerin (LUBRICATING EYE DROPS OP) Apply 1 drop to eye daily as needed (dry eyes).    Marland Kitchen ELIQUIS 5 MG TABS tablet TAKE 1 TABLET BY MOUTH  TWICE DAILY 180 tablet 1  . fluconazole (DIFLUCAN) 200 MG tablet     . hydrochlorothiazide (HYDRODIURIL) 25 MG tablet Take 1 tablet (25 mg total) by mouth daily. Please make yearly appt with Dr. Lovena Le for December before anymore refills. 1st attempt 90 tablet 0  . levothyroxine (SYNTHROID, LEVOTHROID) 88 MCG tablet Take 88 mcg by mouth daily before breakfast.     . losartan (COZAAR) 50 MG tablet TAKE 1 TABLET BY MOUTH  DAILY 90 tablet 3  . raNITIdine HCl (WAL-ZAN 75 PO) Zantac 75    . triamcinolone cream (KENALOG) 0.1 %     . vitamin B-12 (CYANOCOBALAMIN) 100 MCG tablet Take 100 mcg by mouth daily.    . Vitamin D, Ergocalciferol, (DRISDOL) 50000 units CAPS capsule Take 50,000 Units by mouth every 7 (seven) days.     No current facility-administered medications for this visit.     Past Medical History:  Diagnosis Date  . Allergic rhinitis   . Anemia   . Breast cancer, left breast ([place]) 01/15/15   Invasive Mammary  . Chronic venous insufficiency   . Hematuria    negative workup - Dr. Reece Agar  . Hypertension   . Migraine headache   . MVP (mitral valve  prolapse)   . PAF (paroxysmal atrial fibrillation) ([place])   . SVT (supraventricular tachycardia) ([place]) 06/2010   s/p AVNRT ablation  . Wears glasses     ROS:   All systems reviewed and negative except as noted in the HPI.   Past Surgical History:  Procedure Laterality Date  . A-FLUTTER ABLATION N/A 02/04/2017   Procedure: A-Flutter Ablation;  Surgeon: Evans Lance, MD;  Location: [facility];  Service: Cardiovascular;  Laterality: N/A;  . ABDOMINAL HYSTERECTOMY    . AV NODE ABLATION N/A 08/17/2017   Procedure: AV Node Ablation;  Surgeon: Evans Lance, MD;  Location: [facility];  Service: Cardiovascular;  Laterality: N/A;  . BREAST LUMPECTOMY WITH RADIOACTIVE SEED LOCALIZATION Left 02/14/2015   Procedure: BREAST LUMPECTOMY WITH RADIOACTIVE SEED LOCALIZATION;  Surgeon: Alphonsa Overall, MD;  Location: [facility];  Service: General;  Laterality: Left;  . BREAST SURGERY  1990   rt br bx  . CARDIAC CATHETERIZATION  2011   ablasion  . CARDIOVERSION N/A 08/10/2017   Procedure: CARDIOVERSION;  Surgeon: Jerline Pain, MD;  Location: [facility];  Service: Cardiovascular;  Laterality: N/A;  . COLONOSCOPY    . DILATION AND CURETTAGE OF UTERUS    . Left Breast Biopsy  01/15/15  . PACEMAKER  IMPLANT N/A 08/17/2017   Procedure: Pacemaker Implant;  Surgeon: Evans Lance, MD;  Location: [facility];  Service: Cardiovascular;  Laterality: N/A;  . TUBAL LIGATION       Family History  Problem Relation Age of Onset  . Hypertension Mother   . Heart disease Mother   . Arthritis Mother   . Other Father        hardening of the arteries  . Heart Problems Sister        pacemaker  . Heart attack Brother   . Throat cancer Paternal Uncle   . Breast cancer Sister        early 12s  . Alzheimer's disease Sister   . Pulmonary disease Sister   . Breast cancer Daughter 65       Negative genetic testing   . Breast cancer Paternal Aunt        dx over 57  . Cancer  Paternal Aunt        cancer in her leg  . Breast cancer Cousin        multiple paternal cousin     Social History   Socioeconomic History  . Marital status: Married    Spouse name: Not on file  . Number of children: 3  . Years of education: Not on file  . Highest education level: Not on file  Occupational History  . Not on file  Tobacco Use  . Smoking status: Never Smoker  . Smokeless tobacco: Never Used  . Tobacco comment: did smoke about 2cigs long time about 54 plus years ago  Substance and Sexual Activity  . Alcohol use: Yes    Alcohol/week: 0.0 standard drinks    Comment: once a week  . Drug use: No  . Sexual activity: Not on file  Other Topics Concern  . Not on file  Social History Narrative   LIves with husband.    Retired.    Social Determinants of Health   Financial Resource Strain:   . Difficulty of Paying Living Expenses: Not on file  Food Insecurity:   . Worried About Charity fundraiser in the Last Year: Not on file  . Ran Out of Food in the Last Year: Not on file  Transportation Needs:   . Lack of Transportation (Medical): Not on file  . Lack of Transportation (Non-Medical): Not on file  Physical Activity:   . Days of Exercise per Week: Not on file  . Minutes of Exercise per Session: Not on file  Stress:   . Feeling of Stress : Not on file  Social Connections:   . Frequency of Communication with Friends and Family: Not on file  . Frequency of Social Gatherings with Friends and Family: Not on file  . Attends Religious Services: Not on file  . Active Member of Clubs or Organizations: Not on file  . Attends Archivist Meetings: Not on file  . Marital Status: Not on file  Intimate Partner Violence:   . Fear of Current or Ex-Partner: Not on file  . Emotionally Abused: Not on file  . Physically Abused: Not on file  . Sexually Abused: Not on file     BP 122/74   Pulse 81   Ht 5\' 9"  (1.753 m)   Wt 168 lb (76.2 kg)   LMP  (LMP Unknown)    SpO2 99%   BMI 24.81 kg/m   Physical Exam:  Well appearing NAD HEENT: Unremarkable Neck:  No JVD,  no thyromegally Lymphatics:  No adenopathy Back:  No CVA tenderness Lungs:  Clear with no wheezes HEART:  Regular rate rhythm, no murmurs, no rubs, no clicks Abd:  soft, positive bowel sounds, no organomegally, no rebound, no guarding Ext:  2 plus pulses, no edema, no cyanosis, no clubbing Skin:  No rashes no nodules Neuro:  CN II through XII intact, motor grossly intact  EKG - atrial fib with his bundle pacing  DEVICE  Normal device function.  See PaceArt for details.   Assess/Plan: 1. Atrial fib - her VR is well controlled. She will continueher current meds. 2. diastolicheart failure -her symptoms are class 2. I asked her to reduce her salt intake and stay active. 3. PPM- her medtronic PPM is programmed VVIR.   Mikle Bosworth.D.

## 2020-01-11 ENCOUNTER — Encounter (HOSPITAL_BASED_OUTPATIENT_CLINIC_OR_DEPARTMENT_OTHER): Payer: Medicare Other | Attending: Internal Medicine | Admitting: Internal Medicine

## 2020-01-11 DIAGNOSIS — I87331 Chronic venous hypertension (idiopathic) with ulcer and inflammation of right lower extremity: Secondary | ICD-10-CM | POA: Diagnosis not present

## 2020-01-11 NOTE — Progress Notes (Addendum)
ALLYSSA, ABRUZZESE ([id number]) Visit Report for 01/11/2020 Arrival Information Details Patient Name: Date of Service: [name], [name] 01/11/2020 8:45 AM Medical Record [id number] Patient Account Number: [id number] Date of Birth/Sex: August 11, 1933 (86 y.o. Female) Treating RN: [name] Primary Care Gotham Raden: Lajean Manes T Other Clinician: Referring Abhiraj Dozal: Treating Nandini Bogdanski/Extender:Robson, Secundino Ginger, Hal T Weeks in Treatment: 3 Visit Information History Since Last Visit Added or deleted any medications: No Patient Arrived: Ambulatory Any new allergies or adverse reactions: No Arrival Time: 09:01 Had a fall or experienced change in No Accompanied By: self activities of daily living that may affect Transfer Assistance: None risk of falls: Patient Identification Verified: Yes Signs or symptoms of abuse/neglect since last No Secondary Verification Process Yes visito Completed: Hospitalized since last visit: No Patient Requires Transmission-Based No Implantable device outside of the clinic excluding No Precautions: cellular tissue based products placed in the center Patient Has Alerts: Yes since last visit: Patient Alerts: Patient on Blood Has Dressing in Place as Prescribed: Yes Thinner Has Compression in Place as Prescribed: Yes Right ABI:0.99 Pain Present Now: Yes Electronic Signature(s) Signed: 01/11/2020 5:55:15 PM By: [name] RN, BSN Entered By: [name] on 01/11/2020 09:01:27 -------------------------------------------------------------------------------- Compression Therapy Details Patient Name: Date of Service: [name] 01/11/2020 8:45 AM Medical Record [id number] Patient Account Number: [id number] Date of Birth/Sex: 24-Mar-1933 (86 y.o. Female) Treating RN: Kela Millin Primary Care Javonda Suh: Patrina Levering Other Clinician: Referring Santiago Stenzel: Treating Izaiah Tabb/Extender:Robson, Secundino Ginger, Hal  T Weeks in Treatment: 3 Compression Therapy Performed for Wound Wound #3 Right,Lateral Lower Leg Assessment: Performed By: Clinician Deon Pilling, RN Compression Type: Three Layer Post Procedure Diagnosis Same as Pre-procedure Electronic Signature(s) Signed: 01/11/2020 5:49:40 PM By: Kela Millin Entered By: Kela Millin on 01/11/2020 09:36:07 -------------------------------------------------------------------------------- Encounter Discharge Information Details Patient Name: Date of Service: [name] 01/11/2020 8:45 AM Medical Record [id number] Patient Account Number: [id number] Date of Birth/Sex: 1933/09/20 (86 y.o. Female) Treating RN: Deon Pilling Primary Care Boleslaus Holloway: Patrina Levering Other Clinician: Referring Jojo Pehl: Treating Stevenson Windmiller/Extender:Robson, Secundino Ginger, Junius Creamer in Treatment: 3 Encounter Discharge Information Items Discharge Condition: Stable Ambulatory Status: Ambulatory Discharge Destination: Home Transportation: Private Auto Accompanied By: self Schedule Follow-up Appointment: Yes Clinical Summary of Care: Electronic Signature(s) Signed: 01/11/2020 6:00:37 PM By: Deon Pilling Entered By: Deon Pilling on 01/11/2020 09:53:53 -------------------------------------------------------------------------------- Lower Extremity Assessment Details Patient Name: Date of Service: CALLE, SCHADER 01/11/2020 8:45 AM Medical Record [id number] Patient Account Number: [id number] Date of Birth/Sex: 1933-01-03 (86 y.o. Female) Treating RN: [name] Primary Care Cheryl Stabenow: Lajean Manes T Other Clinician: Referring Yitzchak Kothari: Treating Brittony Billick/Extender:Robson, Secundino Ginger, Hal T Weeks in Treatment: 3 Edema Assessment Assessed: [Left: No] [Right: No] Edema: [Left: N] [Right: o] Calf Left: Right: Point of Measurement: 42 cm From Medial Instep cm 31.2 cm Ankle Left: Right: Point of Measurement: 13 cm From Medial  Instep cm 19.5 cm Vascular Assessment Pulses: Dorsalis Pedis Palpable: [Right:Yes] Electronic Signature(s) Signed: 01/11/2020 5:55:15 PM By: [name] RN, BSN Entered By: [name] on 01/11/2020 09:09:56 -------------------------------------------------------------------------------- Multi Wound Chart Details Patient Name: Date of Service: [name] 01/11/2020 8:45 AM Medical Record [id number] Patient Account Number: [id number] Date of Birth/Sex: [birth year] (86 y.o. Female) Treating RN: Primary Care Cherity Blickenstaff: Lajean Manes T Other Clinician: Referring Marsheila Alejo: Treating Ortencia Askari/Extender:Robson, Secundino Ginger, Hal T Weeks in Treatment: 3 Vital Signs Height(in): 69 Pulse(bpm): 50 Weight(lbs): 163 Blood Pressure(mmHg): 138/67 Body Mass Index(BMI): 24 Temperature(F): 97.8 Respiratory 18 Rate(breaths/min): Photos: [3:No Photos] [N/A:N/A] Wound Location: [3:Right Lower Leg -  Lateral N/A] Wounding Event: [3:Gradually Appeared] [N/A:N/A] Primary Etiology: [3:Venous Leg Ulcer] [N/A:N/A] Comorbid History: [3:Cataracts, Anemia, Arrhythmia, Congestive Heart Failure, Hypertension, Peripheral Venous Disease, Received Radiation] [N/A:N/A] Date Acquired: [3:11/19/2019] [N/A:N/A] Weeks of Treatment: [3:3] [N/A:N/A] Wound Status: [3:Open] [N/A:N/A] Measurements L x W x D 0.8x0.6x0.1 [N/A:N/A] (cm) Area (cm) : [3:0.377] [N/A:N/A] Volume (cm) : [3:0.038] [N/A:N/A] % Reduction in Area: [3:31.50%] [N/A:N/A] % Reduction in Volume: [3:30.90%] [N/A:N/A] Classification: [3:Full Thickness Without Exposed Support Structures] [N/A:N/A] Exudate Amount: [3:Medium] [N/A:N/A] Exudate Type: [3:Serous] [N/A:N/A] Exudate Color: [3:amber] [N/A:N/A] Wound Margin: [3:Flat and Intact] [N/A:N/A] Granulation Amount: [3:Medium (34-66%)] [N/A:N/A] Granulation Quality: [3:Red, Pink] [N/A:N/A] Necrotic Amount: [3:Medium (34-66%)] [N/A:N/A] Exposed Structures: [3:Fat Layer  (Subcutaneous N/A Tissue) Exposed: Yes Fascia: No Tendon: No Muscle: No Joint: No Bone: No] Epithelialization: [3:Small (1-33%) Compression Therapy] [N/A:N/A N/A] Treatment Notes Electronic Signature(s) Signed: 01/11/2020 5:55:42 PM By: Linton Ham MD Entered By: Linton Ham on 01/11/2020 09:42:35 -------------------------------------------------------------------------------- Multi-Disciplinary Care Plan Details Patient Name: Date of Service: [name] 01/11/2020 8:45 AM Medical Record [id number] Patient Account Number: [id number] Date of Birth/Sex: [birth year] (86 y.o. Female) Treating RN: Kela Millin Primary Care Deni Lefever: Patrina Levering Other Clinician: Referring Tahji [place]: Treating Castin Donaghue/Extender:Robson, Secundino Ginger, Hal T Weeks in Treatment: 3 Active Inactive Abuse / Safety / Falls / Self Care Management Nursing Diagnoses: Potential for falls Goals: Patient/caregiver will verbalize understanding of skin care regimen Date Initiated: 12/20/2019 Target Resolution Date: 01/18/2020 Goal Status: Active Patient/caregiver will verbalize/demonstrate understanding of what to do in case of emergency Date Initiated: 12/20/2019 Target Resolution Date: 01/18/2020 Goal Status: Active Interventions: Assess fall risk on admission and as needed Provide education on fall prevention Notes: Pain, Acute or Chronic Nursing Diagnoses: Pain, acute or chronic: actual or potential Potential alteration in comfort, pain Goals: Patient will verbalize adequate pain control and receive pain control interventions during procedures as needed Date Initiated: 12/20/2019 Target Resolution Date: 01/18/2020 Goal Status: Active Patient/caregiver will verbalize comfort level met Date Initiated: 12/20/2019 Target Resolution Date: 01/18/2020 Goal Status: Active Interventions: Complete pain assessment as per visit requirements Provide education on pain management Notes: Wound/Skin  Impairment Nursing Diagnoses: Knowledge deficit related to ulceration/compromised skin integrity Goals: Patient/caregiver will verbalize understanding of skin care regimen Date Initiated: 12/20/2019 Target Resolution Date: 01/25/2020 Goal Status: Active Interventions: Assess patient/caregiver ability to perform ulcer/skin care regimen upon admission and as needed Provide education on ulcer and skin care Treatment Activities: Skin care regimen initiated : 12/20/2019 Topical wound management initiated : 12/20/2019 Notes: Electronic Signature(s) Signed: 01/11/2020 5:49:40 PM By: Kela Millin Entered By: Kela Millin on 01/11/2020 09:27:13 -------------------------------------------------------------------------------- Pain Assessment Details Patient Name: Date of Service: [name] 01/11/2020 8:45 AM Medical Record [id number] Patient Account Number: [id number] Date of Birth/Sex: 1933-02-21 (86 y.o. Female) Treating RN: [name] Primary Care Alie Hardgrove: Patrina Levering Other Clinician: Referring Brekyn Huntoon: Treating Chadwick Reiswig/Extender:Robson, Secundino Ginger, Hal T Weeks in Treatment: 3 Active Problems Location of Pain Severity and Description of Pain Patient Has Paino Yes Site Locations Pain Location: Pain in Ulcers With Dressing Change: Yes Duration of the Pain. Constant / Intermittento Intermittent Rate the pain. Current Pain Level: 3 Character of Pain Describe the Pain: Aching Pain Management and Medication Current Pain Management: Rest: Yes Leg drop or elevation: Yes Is the Current Pain Management Adequate: Adequate How does your wound impact your activities of daily livingo Sleep: No Bathing: No Appetite: No Relationship With Others: No Bladder Continence: No Emotions: No Bowel Continence: No Work: No Toileting: No Drive: No Dressing: No Hobbies:  No Electronic Signature(s) Signed: 01/11/2020 5:55:15 PM By: [name] RN,  BSN Entered By: [name] on 01/11/2020 09:04:31 -------------------------------------------------------------------------------- Patient/Caregiver Education Details Patient Name: [name] 1/29/2021andnbsp8:45 Date of Service: AM Medical Record [id number] Number: Patient Account Number: [id number] Treating RN: May 31, 1933 (86 y.o. Kela Millin Treating RN: [birth year] (86 y.o. Kela Millin Date of Birth/Gender: Female) Other Clinician: Primary Care Treating [address]-Road, Hal Bernerd Pho Physician: Physician/Extender: Referring Physician: Serita Grammes in Treatment: 3 Education Assessment Education Provided To: Patient Education Topics Provided Pain: Methods: Explain/Verbal Responses: State content correctly Safety: Methods: Explain/Verbal Responses: State content correctly Wound/Skin Impairment: Methods: Explain/Verbal Responses: State content correctly Electronic Signature(s) Signed: 01/11/2020 5:49:40 PM By: Kela Millin Entered By: Kela Millin on 01/11/2020 09:27:39 -------------------------------------------------------------------------------- Wound Assessment Details Patient Name: Date of Service: Snethen, Braleigh J. 01/11/2020 8:45 AM Medical Record [id number] Patient Account Number: [id number] Date of Birth/Sex: 03/04/1933 (86 y.o. Female) Treating RN: Primary Care Brenan Modesto: Lajean Manes T Other Clinician: Referring Emelda Kohlbeck: Treating Amun Stemm/Extender:Robson, Secundino Ginger, Hal T Weeks in Treatment: 3 Wound Status Wound Number: 3 Primary Venous Leg Ulcer Etiology: Wound Location: Right Lower Leg - Lateral Wound Open Wounding Event: Gradually Appeared Status: Date Acquired: 11/19/2019 Comorbid Cataracts, Anemia, Arrhythmia, Congestive Weeks Of Treatment: 3 History: Heart Failure, Hypertension, Peripheral Clustered Wound: No Venous Disease, Received Radiation Photos Wound Measurements Length: (cm)  0.8 % Reduct Width: (cm) 0.6 % Reduct Depth: (cm) 0.1 Epitheli Area: (cm) 0.377 Tunneli Volume: (cm) 0.038 Undermi Wound Description Full Thickness Without Exposed Support Foul Odo Classification: Structures Slough/F Wound Flat and Intact Margin: Exudate Medium Amount: Exudate Serous Type: Exudate amber Color: Wound Bed Granulation Amount: Medium (34-66%) Granulation Quality: Red, Pink Fascia E Necrotic Amount: Medium (34-66%) Fat Laye Necrotic Quality: Adherent Slough Tendon E Muscle E Joint Ex Bone Exp r After Cleansing: No ibrino Yes Exposed Structure xposed: No r (Subcutaneous Tissue) Exposed: Yes xposed: No xposed: No posed: No osed: No ion in Area: 31.5% ion in Volume: 30.9% alization: Small (1-33%) ng: No ning: No Treatment Notes Wound #3 (Right, Lateral Lower Leg) 1. Cleanse With Wound Cleanser Soap and water 2. Periwound Care Moisturizing lotion TCA Cream 3. Primary Dressing Applied Hydrofera Blue 4. Secondary Dressing Dry Gauze 6. Support Layer Applied 3 layer compression wrap Notes netting. Electronic Signature(s) Signed: 01/22/2020 4:28:10 PM By: Mikeal Hawthorne EMT/HBOT Previous Signature: 01/11/2020 5:55:15 PM Version By: [name] RN, BSN Previous Signature: 01/11/2020 5:55:15 PM Version By: [name] RN, BSN Entered By: Mikeal Hawthorne on 01/21/2020 11:46:26 -------------------------------------------------------------------------------- Vitals Details Patient Name: Date of Service: [name] 01/11/2020 8:45 AM Medical Record [id number] Patient Account Number: [id number] Date of Birth/Sex: 24-Apr-1933 (86 y.o. Female) Treating RN: [name] Primary Care Kody Vigil: Lajean Manes T Other Clinician: Referring Kale Rondeau: Treating Guerry Covington/Extender:Robson, Secundino Ginger, Hal T Weeks in Treatment: 3 Vital Signs Time Taken: 09:03 Temperature (F): 97.8 Height (in): 69 Pulse (bpm): 50 Source:  Stated Respiratory Rate (breaths/min): 18 Weight (lbs): 163 Blood Pressure (mmHg): 138/67 Source: Stated Reference Range: 80 - 120 mg / dl Body Mass Index (BMI): 24.1 Electronic Signature(s) Signed: 01/11/2020 5:55:15 PM By: [name] RN, BSN Entered By: [name] on 01/11/2020 09:05:27

## 2020-01-11 NOTE — Progress Notes (Signed)
Tracy, Green ([id number]) Visit Report for 01/11/2020 HPI Details Patient Name: Date of Service: [name], [name] 01/11/2020 8:45 AM Medical Record [id number] Patient Account Number: [id number] Date of Birth/Sex: Treating RN: [birth year] (86 y.o. F) Primary Care Provider: Patrina Levering Other Clinician: Referring Provider: Treating Provider/Extender:Eh Sauseda, Secundino Ginger, Hal T Weeks in Treatment: 3 History of Present Illness HPI Description: ADMISSION 12/20/2019 This is an 86 year old woman referred by her primary physician Dr. Felipa Eth for review of a wound on her right lateral lower leg. She was actually in this clinic on 2 separate occasions in 2010 and 2012 cared for by Dr. Sherilyn Cooter. At that point in time she had wounds on her right leg as well. She tells [place] to 1 month ago she noticed a scab building up on her right lateral lower leg this opened into a wound. There was no overt cause of this no trauma, no infection that she is aware of. She has a history of chronic venous insufficiency and wears compression stockings fairly religiously indeed she is done well over the last 8 years since she was last in this clinic. She has been applying Vaseline on this and a covering phone. This is not progressing towards healing. Past medical history; interstitial lung disease, chronic atrial fibrillation status post pacemaker, osteoarthritis of the left knee, left breast CA, chronic repeat venous insufficiency. She takes Eliquis for her atrial fibrillation stroke prophylaxis. ABI in our clinic was 0.99 on the right 1/15; superficial wound on the right lateral calf in the setting of severe acute skin changes from chronic venous insufficiency and lymphedema. We used Iodoflex last week she complained of a lot of pain 1/22; this is a small but difficult wound on the right lateral calf in the setting of severe chronic venous insufficiency and secondary lymphedema. She continues to state  the wounds things hurts when she is up on it but seems to be relieved by putting her leg up. I changed her to [facility] last week because the Iodoflex seem to be causing stinging. 1/29. This wound appears to be contracting somewhat. Changes of chronic venous insufficiency with secondary lymphedema Electronic Signature(s) Signed: 01/11/2020 5:55:42 PM By: Linton Ham MD Entered By: Linton Ham on 01/11/2020 09:43:58 -------------------------------------------------------------------------------- Physical Exam Details Patient Name: Date of Service: Tracy, [name] 01/11/2020 8:45 AM Medical Record [id number] Patient Account Number: [id number] Date of Birth/Sex: Treating RN: 05/22/1933 (86 y.o. F) Primary Care Provider: Patrina Levering Other Clinician: Referring Provider: Treating Provider/Extender:Ahman Dugdale, Secundino Ginger, Hal T Weeks in Treatment: 3 Constitutional Sitting or standing Blood Pressure is within target range for patient.. Pulse regular and within target range for patient.Marland Kitchen Respirations regular, non-labored and within target range.. Temperature is normal and within the target range for the patient.Marland Kitchen Appears in no distress. Cardiovascular Pedal pulses are palpable. Edema is controlled. Dilated venules in the feet compatible with venous hypertension. Integumentary (Hair, Skin) No erythema around the wound. Notes Wound exam; the area in question is a small superficial area most of it is covered today and I think the dimensions are smaller. She has clear chronic venous insufficiency. No debridement today. Electronic Signature(s) Signed: 01/11/2020 5:55:42 PM By: Linton Ham MD Entered By: Linton Ham on 01/11/2020 09:45:29 -------------------------------------------------------------------------------- Physician Orders Details Patient Name: Date of Service: [name] 01/11/2020 8:45 AM Medical Record [id number] Patient Account  Number: [id number] Date of Birth/Sex: Treating RN: 11-26-1933 (86 y.o. [name] Primary Care Provider: Patrina Levering Other Clinician: Referring Provider: Treating Provider/Extender:Meng Winterton, Secundino Ginger,  Hal T Weeks in Treatment: 3 Verbal / Phone Orders: No Diagnosis Coding ICD-10 Coding Code Description I87.321 Chronic venous hypertension (idiopathic) with inflammation of right lower extremity L97.812 Non-pressure chronic ulcer of other part of right lower leg with fat layer exposed Follow-up Appointments Return Appointment in 1 week. - Friday Dressing Change Frequency Do not change entire dressing for one week. Skin Barriers/Peri-Wound Care Moisturizing lotion TCA Cream or Ointment - mixed with lotion to leg Wound Cleansing May shower with protection. - cast protector Primary Wound Dressing Wound #3 Right,Lateral Lower Leg Hydrofera Blue - ready Secondary Dressing Wound #3 Right,Lateral Lower Leg Dry Gauze Edema Control 3 Layer Compression System - Right Lower Extremity Avoid standing for long periods of time Elevate legs to the level of the heart or above for 30 minutes daily and/or when sitting, a frequency of: - throughout the day. Exercise regularly Electronic Signature(s) Signed: 01/11/2020 5:49:40 PM By: Kela Millin Signed: 01/11/2020 5:55:42 PM By: Linton Ham MD Entered By: Kela Millin on 01/11/2020 09:26:00 -------------------------------------------------------------------------------- Problem List Details Patient Name: Date of Service: [name] 01/11/2020 8:45 AM Medical Record [id number] Patient Account Number: [id number] Date of Birth/Sex: Treating RN: 06/12/1933 (86 y.o. [name] Primary Care Provider: Patrina Levering Other Clinician: Referring Provider: Treating Provider/Extender:Tracye Szuch, Secundino Ginger, Junius Creamer in Treatment: 3 Active Problems ICD-10 Evaluated Encounter Code  Description Active Date Today Diagnosis I87.321 Chronic venous hypertension (idiopathic) with 12/20/2019 No Yes inflammation of right lower extremity L97.812 Non-pressure chronic ulcer of other part of right lower 12/20/2019 No Yes leg with fat layer exposed Inactive Problems Resolved Problems Electronic Signature(s) Signed: 01/11/2020 5:55:42 PM By: Linton Ham MD Entered By: Linton Ham on 01/11/2020 09:42:26 -------------------------------------------------------------------------------- Progress Note Details Patient Name: Date of Service: [name] 01/11/2020 8:45 AM Medical Record [id number] Patient Account Number: [id number] Date of Birth/Sex: Treating RN: 01/03/1933 (86 y.o. F) Primary Care Provider: Patrina Levering Other Clinician: Referring Provider: Treating Provider/Extender:Reneta Niehaus, Secundino Ginger, Hal T Weeks in Treatment: 3 Subjective History of Present Illness (HPI) ADMISSION 12/20/2019 This is an 86 year old woman referred by her primary physician Dr. Felipa Eth for review of a wound on her right lateral lower leg. She was actually in this clinic on 2 separate occasions in 2010 and 2012 cared for by Dr. Sherilyn Cooter. At that point in time she had wounds on her right leg as well. She tells [place] to 1 month ago she noticed a scab building up on her right lateral lower leg this opened into a wound. There was no overt cause of this no trauma, no infection that she is aware of. She has a history of chronic venous insufficiency and wears compression stockings fairly religiously indeed she is done well over the last 8 years since she was last in this clinic. She has been applying Vaseline on this and a covering phone. This is not progressing towards healing. Past medical history; interstitial lung disease, chronic atrial fibrillation status post pacemaker, osteoarthritis of the left knee, left breast CA, chronic repeat venous insufficiency. She takes Eliquis for  her atrial fibrillation stroke prophylaxis. ABI in our clinic was 0.99 on the right 1/15; superficial wound on the right lateral calf in the setting of severe acute skin changes from chronic venous insufficiency and lymphedema. We used Iodoflex last week she complained of a lot of pain 1/22; this is a small but difficult wound on the right lateral calf in the setting of severe chronic venous insufficiency and secondary lymphedema. She continues to  state the wounds things hurts when she is up on it but seems to be relieved by putting her leg up. I changed her to [facility] last week because the Iodoflex seem to be causing stinging. 1/29. This wound appears to be contracting somewhat. Changes of chronic venous insufficiency with secondary lymphedema Objective Constitutional Sitting or standing Blood Pressure is within target range for patient.. Pulse regular and within target range for patient.Marland Kitchen Respirations regular, non-labored and within target range.. Temperature is normal and within the target range for the patient.Marland Kitchen Appears in no distress. Vitals Time Taken: 9:03 AM, Height: 69 in, Source: Stated, Weight: 163 lbs, Source: Stated, BMI: 24.1, Temperature: 97.8 F, Pulse: 50 bpm, Respiratory Rate: 18 breaths/min, Blood Pressure: 138/67 mmHg. Cardiovascular Pedal pulses are palpable. Edema is controlled. Dilated venules in the feet compatible with venous hypertension. General Notes: Wound exam; the area in question is a small superficial area most of it is covered today and I think the dimensions are smaller. She has clear chronic venous insufficiency. No debridement today. Integumentary (Hair, Skin) No erythema around the wound. Wound #3 status is Open. Original cause of wound was Gradually Appeared. The wound is located on the Right,Lateral Lower Leg. The wound measures 0.8cm length x 0.6cm width x 0.1cm depth; 0.377cm^2 area and 0.038cm^3 volume. There is Fat Layer (Subcutaneous  Tissue) Exposed exposed. There is no tunneling or undermining noted. There is a medium amount of serous drainage noted. The wound margin is flat and intact. There is medium (34-66%) red, pink granulation within the wound bed. There is a medium (34-66%) amount of necrotic tissue within the wound bed including Adherent Slough. Assessment Active Problems ICD-10 Chronic venous hypertension (idiopathic) with inflammation of right lower extremity Non-pressure chronic ulcer of other part of right lower leg with fat layer exposed Procedures Wound #3 Pre-procedure diagnosis of Wound #3 is a Venous Leg Ulcer located on the Right,Lateral Lower Leg . There was a Three Layer Compression Therapy Procedure by Deon Pilling, RN. Post procedure Diagnosis Wound #3: Same as Pre-Procedure Plan Follow-up Appointments: Return Appointment in 1 week. - Friday Dressing Change Frequency: Do not change entire dressing for one week. Skin Barriers/Peri-Wound Care: Moisturizing lotion TCA Cream or Ointment - mixed with lotion to leg Wound Cleansing: May shower with protection. - cast protector Primary Wound Dressing: Wound #3 Right,Lateral Lower Leg: Hydrofera Blue - ready Secondary Dressing: Wound #3 Right,Lateral Lower Leg: Dry Gauze Edema Control: 3 Layer Compression System - Right Lower Extremity Avoid standing for long periods of time Elevate legs to the level of the heart or above for 30 minutes daily and/or when sitting, a frequency of: - throughout the day. Exercise regularly 1. Hydrofera Blue with 3 layer compression to continue 2. I think this is contracting. No change in dressing is indicated at present Electronic Signature(s) Signed: 01/11/2020 5:55:42 PM By: Linton Ham MD Entered By: Linton Ham on 01/11/2020 09:46:07 -------------------------------------------------------------------------------- [place] Details Patient Name: Date of Service: [name] 01/11/2020 Medical  Record [id number] Patient Account Number: [id number] Date of Birth/Sex: Treating RN: 03/15/1933 (86 y.o. [name] Primary Care Provider: Patrina Levering Other Clinician: Referring Provider: Treating Provider/Extender:Laikyn Gewirtz, Secundino Ginger, Hal T Weeks in Treatment: 3 Diagnosis Coding ICD-10 Codes Code Description [phone] Chronic venous hypertension (idiopathic) with inflammation of right lower extremity L97.812 Non-pressure chronic ulcer of other part of right lower leg with fat layer exposed Facility Procedures The patient participates with Medicare or their insurance follows the Medicare Facility Guidelines: CPT4 Code  Description Modifier Quantity [id number] (Facility Use Only) [phone] - APPLY [place] RT LEG 1 Physician Procedures Electronic Signature(s) Signed: 01/11/2020 5:55:42 PM By: Linton Ham MD Entered By: Linton Ham on 01/11/2020 09:46:27

## 2020-01-18 ENCOUNTER — Other Ambulatory Visit: Payer: Self-pay

## 2020-01-18 ENCOUNTER — Encounter (HOSPITAL_BASED_OUTPATIENT_CLINIC_OR_DEPARTMENT_OTHER): Payer: Medicare Other | Attending: Internal Medicine

## 2020-01-18 DIAGNOSIS — Z950 Presence of cardiac pacemaker: Secondary | ICD-10-CM | POA: Insufficient documentation

## 2020-01-18 DIAGNOSIS — I872 Venous insufficiency (chronic) (peripheral): Secondary | ICD-10-CM | POA: Diagnosis not present

## 2020-01-18 DIAGNOSIS — L97812 Non-pressure chronic ulcer of other part of right lower leg with fat layer exposed: Secondary | ICD-10-CM | POA: Diagnosis present

## 2020-01-18 DIAGNOSIS — I87321 Chronic venous hypertension (idiopathic) with inflammation of right lower extremity: Secondary | ICD-10-CM | POA: Insufficient documentation

## 2020-01-18 DIAGNOSIS — L97522 Non-pressure chronic ulcer of other part of left foot with fat layer exposed: Secondary | ICD-10-CM | POA: Insufficient documentation

## 2020-01-18 DIAGNOSIS — I10 Essential (primary) hypertension: Secondary | ICD-10-CM | POA: Diagnosis not present

## 2020-01-18 DIAGNOSIS — I482 Chronic atrial fibrillation, unspecified: Secondary | ICD-10-CM | POA: Diagnosis not present

## 2020-01-18 DIAGNOSIS — Z853 Personal history of malignant neoplasm of breast: Secondary | ICD-10-CM | POA: Diagnosis not present

## 2020-01-18 DIAGNOSIS — I890 Lymphedema, not elsewhere classified: Secondary | ICD-10-CM | POA: Diagnosis not present

## 2020-01-18 NOTE — Progress Notes (Signed)
[name], [name] ([id number]) Visit Report for 01/18/2020 SuperBill Details Patient Name: Date of Service: NUR, GOSSMAN 01/18/2020 Medical Record [id number] Patient Account Number: [id number] Date of Birth/Sex: Treating RN: 01-20-1933 (86 y.o. Female) Deon Pilling Primary Care Provider: Lajean Manes T Other Clinician: Referring Provider: Treating Provider/Extender:Ahtziry Saathoff, Secundino Ginger, Hal T Weeks in Treatment: 4 Diagnosis Coding ICD-10 Codes Code Description I87.321 Chronic venous hypertension (idiopathic) with inflammation of right lower extremity L97.812 Non-pressure chronic ulcer of other part of right lower leg with fat layer exposed Facility Procedures The patient participates with Medicare or their insurance follows the Medicare Facility Guidelines CPT4 Code Description Modifier Quantity [id number] (Facility Use Only) [phone] - APPLY [place] 1 Electronic Signature(s) Signed: 01/18/2020 5:45:44 PM By: Linton Ham MD Signed: 01/18/2020 5:54:34 PM By: Deon Pilling Entered By: Deon Pilling on 01/18/2020 09:20:56

## 2020-01-18 NOTE — Progress Notes (Signed)
[name] ([id number]) Visit Report for 01/18/2020 Arrival Information Details Patient Name: Date of Service: [name], [name] 01/18/2020 8:45 AM Medical Record [id number] Patient Account Number: [id number] Date of Birth/Sex: March 28, 1933 (86 y.o. Female) Treating RN: Deon Pilling Primary Care Windell Musson: Lajean Manes T Other Clinician: Referring Davon Abdelaziz: Treating Arieh Bogue/Extender:Robson, Secundino Ginger, Hal T Weeks in Treatment: 4 Visit Information History Since Last Visit Added or deleted any medications: No Patient Arrived: Ambulatory Any new allergies or adverse reactions: No Arrival Time: 09:01 Had a fall or experienced change in No Accompanied By: self activities of daily living that may affect Transfer Assistance: None risk of falls: Patient Identification Verified: Yes Signs or symptoms of abuse/neglect since last No Secondary Verification Process Yes visito Completed: Hospitalized since last visit: No Patient Requires Transmission- No Implantable device outside of the clinic excluding No Based Precautions: cellular tissue based products placed in the center Patient Has Alerts: Yes since last visit: Patient Alerts: Patient on Blood Has Dressing in Place as Prescribed: Yes Thinner Has Compression in Place as Prescribed: Yes Right ABI:0.99 Pain Present Now: Yes Electronic Signature(s) Signed: 01/18/2020 5:54:34 PM By: Deon Pilling Entered By: Deon Pilling on 01/18/2020 09:04:03 -------------------------------------------------------------------------------- Compression Therapy Details Patient Name: Date of Service: [name], [name] 01/18/2020 8:45 AM Medical Record [id number] Patient Account Number: [id number] Date of Birth/Sex: 02-06-1933 (86 y.o. Female) Treating RN: Deon Pilling Primary Care [place]: Patrina Levering Other Clinician: Referring Johnnetta Holstine: Treating Irving Bloor/Extender:Robson, Secundino Ginger, Hal T Weeks in Treatment:  4 Compression Therapy Performed for Wound Wound #3 Right,Lateral Lower Leg Assessment: Performed By: Clinician Deon Pilling, RN Compression Type: Three Layer Pre Treatment ABI: 1 Electronic Signature(s) Signed: 01/18/2020 5:54:34 PM By: Deon Pilling Entered By: Deon Pilling on 01/18/2020 09:18:48 -------------------------------------------------------------------------------- Encounter Discharge Information Details Patient Name: Date of Service: [name] 01/18/2020 8:45 AM Medical Record [id number] Patient Account Number: [id number] Date of Birth/Sex: 1933-06-12 (86 y.o. Female) Treating RN: Deon Pilling Primary Care Sherman Donaldson: Patrina Levering Other Clinician: Referring Alanny Rivers: Treating Jakwon Gayton/Extender:Robson, Secundino Ginger, Junius Creamer in Treatment: 4 Encounter Discharge Information Items Discharge Condition: Stable Ambulatory Status: Ambulatory Discharge Destination: Home Transportation: Private Auto Accompanied By: self Schedule Follow-up Appointment: Yes Clinical Summary of Care: Electronic Signature(s) Signed: 01/18/2020 5:54:34 PM By: Deon Pilling Entered By: Deon Pilling on 01/18/2020 09:20:48 -------------------------------------------------------------------------------- Pain Assessment Details Patient Name: Date of Service: Kokesh, Kasyn J. 01/18/2020 8:45 AM Medical Record [id number] Patient Account Number: [id number] Date of Birth/Sex: 02-17-1933 (86 y.o. Female) Treating RN: Deon Pilling Primary Care Sabrine Patchen: Patrina Levering Other Clinician: Referring Nataly Pacifico: Treating Ajahnae Rathgeber/Extender:Robson, Secundino Ginger, Hal T Weeks in Treatment: 4 Active Problems Location of Pain Severity and Description of Pain Patient Has Paino Yes Site Locations Pain Location: Pain in Ulcers Rate the pain. Current Pain Level: 3 Worst Pain Level: 10 Least Pain Level: 0 Tolerable Pain Level: 8 Pain Management and Medication Current Pain  Management: Medication: No Cold Application: No Rest: No Massage: No Activity: No T.E.N.S.: No Heat Application: No Leg drop or elevation: No Is the Current Pain Management Adequate: Adequate How does your wound impact your activities of daily livingo Sleep: No Bathing: No Appetite: No Relationship With Others: No Bladder Continence: No Emotions: No Bowel Continence: No Work: No Toileting: No Drive: No Dressing: No Hobbies: No Electronic Signature(s) Signed: 01/18/2020 5:54:34 PM By: Deon Pilling Entered By: Deon Pilling on 01/18/2020 09:04:26 -------------------------------------------------------------------------------- Patient/Caregiver Education Details Patient Name: [name] 2/5/2021andnbsp8:45 Date of Service: AM Medical Record [id number] Number: Patient Account Number: [id number]  Treating RN: Date of Birth/Gender: 08/13/1933 (86 y.o. Deon Pilling Female) Other Clinician: Primary Care Treating [place], Hal Bernerd Pho Physician: Physician/Extender: Referring Physician: Serita Grammes in Treatment: 4 Education Assessment Education Provided To: Patient Education Topics Provided Pain: Handouts: A Guide to Pain Control Methods: Explain/Verbal Responses: Reinforcements needed Electronic Signature(s) Signed: 01/18/2020 5:54:34 PM By: Deon Pilling Entered By: Deon Pilling on 01/18/2020 09:20:39 -------------------------------------------------------------------------------- Wound Assessment Details Patient Name: Date of Service: [name] 01/18/2020 8:45 AM Medical Record [id number] Patient Account Number: [id number] Date of Birth/Sex: 03/19/1933 (86 y.o. Female) Treating RN: Deon Pilling Primary Care Keelynn Furgerson: Lajean Manes T Other Clinician: Referring Lucie Friedlander: Treating Sirinity Outland/Extender:Robson, Secundino Ginger, Hal T Weeks in Treatment: 4 Wound Status Wound Number: 3 Primary Venous Leg Ulcer Etiology: Wound  Location: Right Lower Leg - Lateral Wound Open Wounding Event: Gradually Appeared Status: Date Acquired: 11/19/2019 Comorbid Cataracts, Anemia, Arrhythmia, Congestive Weeks Of Treatment: 4 History: Heart Failure, Hypertension, Peripheral Clustered Wound: No Venous Disease, Received Radiation Wound Measurements Length: (cm) 0.7 % Reduct Width: (cm) 0.6 % Reduct Depth: (cm) 0.1 Epitheli Area: (cm) 0.33 Tunneli Volume: (cm) 0.033 Undermi Wound Description Classification: Full Thickness Without Exposed Support Foul Odo Structures Slough/F Wound Flat and Intact Margin: Exudate Medium Amount: Exudate Serous Type: Exudate amber Color: Wound Bed Granulation Amount: Large (67-100%) Granulation Quality: Red, Pink Fascia E Necrotic Amount: Small (1-33%) Fat Laye Necrotic Quality: Adherent Slough Tendon E Muscle E Joint Expos Bone Expose r After Cleansing: No ibrino Yes Exposed Structure xposed: No r (Subcutaneous Tissue) Exposed: Yes xposed: No xposed: No ed: No d: No ion in Area: 40% ion in Volume: 40% alization: Small (1-33%) ng: No ning: No Treatment Notes Wound #3 (Right, Lateral Lower Leg) 1. Cleanse With Wound Cleanser Soap and water 2. Periwound Care Moisturizing lotion TCA Cream 3. Primary Dressing Applied Hydrofera Blue 4. Secondary Dressing Dry Gauze 6. Support Layer Applied 3 layer compression wrap Notes netting. Electronic Signature(s) Signed: 01/18/2020 5:54:34 PM By: Deon Pilling Entered By: Deon Pilling on 01/18/2020 09:11:09 -------------------------------------------------------------------------------- Vitals Details Patient Name: Date of Service: [name] 01/18/2020 8:45 AM Medical Record [id number] Patient Account Number: [id number] Date of Birth/Sex: Apr 12, 1933 (86 y.o. Female) Treating RN: Deon Pilling Primary Care Trebor Galdamez: Lajean Manes T Other Clinician: Referring Loyde Orth: Treating Jefte Carithers/Extender:Robson,  Secundino Ginger, Hal T Weeks in Treatment: 4 Vital Signs Time Taken: 09:00 Temperature (F): 98.3 Height (in): 69 Pulse (bpm): 75 Weight (lbs): 163 Respiratory Rate (breaths/min): 20 Body Mass Index (BMI): 24.1 Blood Pressure (mmHg): 147/74 Reference Range: 80 - 120 mg / dl Electronic Signature(s) Signed: 01/18/2020 5:54:34 PM By: Deon Pilling Entered By: Deon Pilling on 01/18/2020 09:05:40

## 2020-01-25 ENCOUNTER — Other Ambulatory Visit: Payer: Self-pay

## 2020-01-25 ENCOUNTER — Encounter (HOSPITAL_BASED_OUTPATIENT_CLINIC_OR_DEPARTMENT_OTHER): Payer: Medicare Other | Admitting: Internal Medicine

## 2020-01-25 DIAGNOSIS — L97812 Non-pressure chronic ulcer of other part of right lower leg with fat layer exposed: Secondary | ICD-10-CM | POA: Diagnosis not present

## 2020-01-28 NOTE — Progress Notes (Signed)
[name] ([id number]) Visit Report for 01/25/2020 Debridement Details Patient Name: Date of Service: [name], [name] 01/25/2020 8:45 AM Medical Record [id number] Patient Account Number: [id number] Date of Birth/Sex: Treating RN: [birth year] (86 y.o. [name]: [name]: Referring Green: Treating Green/Extender:[name], [name], [name] in Treatment: 5 Debridement Performed for Wound #3 Right,Lateral Lower Leg Assessment: Performed By: Physician [name]., MD Debridement Type: Debridement Severity of Tissue Pre Fat layer exposed Debridement: Level of Consciousness (Pre- Awake and Alert procedure): Pre-procedure Verification/Time Out Taken: Yes - 09:55 Start Time: 09:55 Pain Control: Other : benzocaine, 20% Total Area Debrided (L x W): 0.9 (cm) x 0.8 (cm) = 0.72 (cm) Tissue and other material Viable, Non-Viable, Slough, Subcutaneous, Slough debrided: Level: Skin/Subcutaneous Tissue Debridement Description: Excisional Instrument: Curette Bleeding: Minimum Hemostasis Achieved: Pressure End Time: 09:56 Procedural Pain: 0 Post Procedural Pain: 0 Response to Treatment: Procedure was tolerated well Level of Consciousness Awake and Alert (Post-procedure): Post Debridement Measurements of Total Wound Length: (cm) 0.9 Width: (cm) 0.8 Depth: (cm) 0.2 Volume: (cm) 0.113 Character of Wound/Ulcer Post Improved Debridement: Severity of Tissue Post Debridement: Fat layer exposed Post Procedure Diagnosis Same as Pre-procedure Electronic Signature(s) Signed: 01/25/2020 5:45:04 PM By: Tracy Ham MD Signed: 01/28/2020 6:12:41 PM By: [name] Entered By: [name] on 01/25/2020 09:57:10 -------------------------------------------------------------------------------- Debridement Details Patient Name: Date of Service: [name] 01/25/2020 8:45 AM Medical  Record [id number] Patient Account Number: [id number] Date of Birth/Sex: Treating RN: 06/16/1933 (86 y.o. [name]: [name]: Referring Green: Treating Green/Extender:[name], [name], [name] in Treatment: 5 Debridement Performed for Wound #4 Left,Medial Foot Assessment: Performed By: Physician [name]., MD Debridement Type: Debridement Level of Consciousness (Pre- Awake and Alert procedure): Pre-procedure Yes - 09:55 Verification/Time Out Taken: Start Time: 09:55 Pain Control: Other : benzocaine, 20% Total Area Debrided (L x W): 0.5 (cm) x 0.5 (cm) = 0.25 (cm) Tissue and other material Viable, Non-Viable, Slough, Subcutaneous, Slough debrided: Level: Skin/Subcutaneous Tissue Debridement Description: Excisional Instrument: Curette Bleeding: Minimum Hemostasis Achieved: Pressure End Time: 09:56 Procedural Pain: 0 Post Procedural Pain: 0 Response to Treatment: Procedure was tolerated well Level of Consciousness Awake and Alert (Post-procedure): Post Debridement Measurements of Total Wound Length: (cm) 0.5 Stage: Category/Stage II Width: (cm) 0.5 Depth: (cm) 0.1 Volume: (cm) 0.02 Character of Wound/Ulcer Post Improved Debridement: Post Procedure Diagnosis Same as Pre-procedure Electronic Signature(s) Signed: 01/25/2020 5:45:04 PM By: Tracy Ham MD Signed: 01/28/2020 6:12:41 PM By: [name] Entered By: [name] on 01/25/2020 09:58:45 -------------------------------------------------------------------------------- HPI Details Patient Name: Date of Service: [name] 01/25/2020 8:45 AM Medical Record [id number] Patient Account Number: [id number] Date of Birth/Sex: Treating RN: Tracy 29, 1934 (86 y.o. [name]: [name] Other Green: Referring Green: Treating Green/Extender:[name],  [name], [name] in Treatment: 5 History of Present Illness HPI Description: ADMISSION 12/20/2019 This is an 86 year old woman referred by her primary physician Dr. Felipa Green for review of a wound on her right lateral lower leg. She was actually in this clinic on 2 separate occasions in 2010 and 2012 cared for by Dr. Sherilyn Green. At that point in time she had wounds on her right leg as well. She tells [place] to 1 month ago she noticed a scab building up on her right lateral lower leg this opened into a wound. There was no overt cause of this no trauma, no infection that she  is aware of. She has a history of chronic venous insufficiency and wears compression stockings fairly religiously indeed she is done well over the last 8 years since she was last in this clinic. She has been applying Vaseline on this and a covering phone. This is not progressing towards healing. Past medical history; interstitial lung disease, chronic atrial fibrillation status post pacemaker, osteoarthritis of the left knee, left breast CA, chronic repeat venous insufficiency. She takes Eliquis for her atrial fibrillation stroke prophylaxis. ABI in our clinic was 0.99 on the right 1/15; superficial wound on the right lateral calf in the setting of severe acute skin changes from chronic venous insufficiency and lymphedema. We used Iodoflex last week she complained of a lot of pain 1/22; this is a small but difficult wound on the right lateral calf in the setting of severe chronic venous insufficiency and secondary lymphedema. She continues to state the wounds things hurts when she is up on it but seems to be relieved by putting her leg up. I changed her to [facility] last week because the Iodoflex seem to be causing stinging. 1/29. This wound appears to be contracting somewhat. Changes of chronic venous insufficiency with secondary lymphedema 2/12; not as good as surface today and slightly bigger. I had to change  her from Iodoflex to [facility] because of the stinging pain although she does not think it was any better on the [facility]. She comes into clinic today with a area on the medial left great toe. She said she noticed blood on her sock last Saturday. She had some form of bunion surgery by Dr. Ila Mcgill her podiatrist sometime in 2019 she said he "shave the area". I could not find his operative report although I did see reference to the bunion in the area. Electronic Signature(s) Signed: 01/25/2020 5:45:04 PM By: Tracy Ham MD Entered By: [name] on 01/25/2020 10:13:33 -------------------------------------------------------------------------------- Physical Exam Details Patient Name: Date of Service: [name], Tracy J. 01/25/2020 8:45 AM Medical Record [id number] Patient Account Number: [id number] Date of Birth/Sex: Treating RN: 03-08-1933 (86 y.o. [name]: [name] Other Green: Referring Green: Treating Green/Extender:[place], [name], [name] in Treatment: 5 Cardiovascular Pedal pulses are palpable on both sides. Integumentary (Hair, Skin) There is erythema around the wound on the left medial foot but no real tenderness. I suspect this is from friction with her foot wear which was really not appropriate.. Notes Wound exam; the area in question is at the smaller superficial area on the right lateral calf. Again 100% covered in a very fibrinous debris with some eschar around the wound edge. I used a #3 curette to debride this and after discussion with her we will move back to Iodoflex there is no surrounding infection The new areas on the medial part of her first metatarsal head. Necrotic surface eschar again removed with a #3 curette eschar around the wound removed. Hemostasis with direct pressure. I do not think there is any infection here either. Electronic Signature(s) Signed: 01/25/2020  5:45:04 PM By: Tracy Ham MD Entered By: [name] on 01/25/2020 10:15:01 -------------------------------------------------------------------------------- Physician Orders Details Patient Name: Date of Service: [name], Tracy J. 01/25/2020 8:45 AM Medical Record [id number] Patient Account Number: [id number] Date of Birth/Sex: Treating RN: 12/31/1932 (86 y.o. [name]: [name] Other Green: Referring Green: Treating Green/Extender:Dailan Pfalzgraf, [name], [name] in Treatment: 5 Verbal / Phone Orders: No Diagnosis Coding ICD-10 Coding Code Description  [id number] Chronic venous hypertension (idiopathic) with inflammation of right lower extremity L97.812 Non-pressure chronic ulcer of other part of right lower leg with fat layer exposed Follow-up Appointments Return Appointment in 1 week. - Friday Dressing Change Frequency Wound #4 Left,Medial Foot Change Dressing every other day. - only to left medial foot wound Wound #3 Right,Lateral Lower Leg Do not change entire dressing for one week. Skin Barriers/Peri-Wound Care Moisturizing lotion TCA Cream or Ointment - mixed with lotion to leg Wound Cleansing May shower with protection. - cast protector Primary Wound Dressing Wound #3 Right,Lateral Lower Leg Iodoflex Wound #4 Left,Medial Foot Iodoflex Secondary Dressing Wound #3 Right,Lateral Lower Leg Dry Gauze Wound #4 Left,Medial Foot Foam - or tape Dry Gauze Edema Control 3 Layer Compression System - Right Lower Extremity Avoid standing for long periods of time Elevate legs to the level of the heart or above for 30 minutes daily and/or when sitting, a frequency of: - throughout the day. Exercise regularly Off-Loading Open toe surgical shoe to: - needs show for left foot Electronic Signature(s) Signed: 01/25/2020 5:45:04 PM By: Tracy Ham MD Signed: 01/28/2020 6:12:41 PM By: [name] Entered  By: [name] on 01/25/2020 10:02:25 -------------------------------------------------------------------------------- Problem List Details Patient Name: Date of Service: [name] 01/25/2020 8:45 AM Medical Record [id number] Patient Account Number: [id number] Date of Birth/Sex: Treating RN: 20-Sep-1933 (86 y.o. [name]: [name]: Referring Green: Treating Green/Extender:Randell Detter, [name], Junius Creamer in Treatment: 5 Active Problems ICD-10 Evaluated Encounter Evaluated Encounter Code Description Active Date Today Diagnosis I87.321 Chronic venous hypertension (idiopathic) with 12/20/2019 No Yes inflammation of right lower extremity L97.812 Non-pressure chronic ulcer of other part of right lower 12/20/2019 No Yes leg with fat layer exposed L97.522 Non-pressure chronic ulcer of other part of left foot 01/25/2020 No Yes with fat layer exposed Inactive Problems Resolved Problems Electronic Signature(s) Signed: 01/25/2020 5:45:04 PM By: Tracy Ham MD Entered By: [name] on 01/25/2020 10:11:41 -------------------------------------------------------------------------------- Progress Note Details Patient Name: Date of Service: [name], Tracy J. 01/25/2020 8:45 AM Medical Record [id number] Patient Account Number: [id number] Date of Birth/Sex: Treating RN: 1933-06-12 (86 y.o. [name]: [name] Other Green: Referring Green: Treating Green/Extender:Darik Massing, [name], [name] in Treatment: 5 Subjective History of Present Illness (HPI) ADMISSION 12/20/2019 This is an 86 year old woman referred by her primary physician Dr. Felipa Green for review of a wound on her right lateral lower leg. She was actually in this clinic on 2 separate occasions in 2010 and 2012 cared for by Dr. Sherilyn Green. At that point in time she had wounds on  her right leg as well. She tells [place] to 1 month ago she noticed a scab building up on her right lateral lower leg this opened into a wound. There was no overt cause of this no trauma, no infection that she is aware of. She has a history of chronic venous insufficiency and wears compression stockings fairly religiously indeed she is done well over the last 8 years since she was last in this clinic. She has been applying Vaseline on this and a covering phone. This is not progressing towards healing. Past medical history; interstitial lung disease, chronic atrial fibrillation status post pacemaker, osteoarthritis of the left knee, left breast CA, chronic repeat venous insufficiency. She takes Eliquis for her atrial fibrillation stroke prophylaxis. ABI in our clinic was 0.99 on the right 1/15; superficial wound on the right lateral calf in the setting of  severe acute skin changes from chronic venous insufficiency and lymphedema. We used Iodoflex last week she complained of a lot of pain 1/22; this is a small but difficult wound on the right lateral calf in the setting of severe chronic venous insufficiency and secondary lymphedema. She continues to state the wounds things hurts when she is up on it but seems to be relieved by putting her leg up. I changed her to [facility] last week because the Iodoflex seem to be causing stinging. 1/29. This wound appears to be contracting somewhat. Changes of chronic venous insufficiency with secondary lymphedema 2/12; not as good as surface today and slightly bigger. I had to change her from Iodoflex to [facility] because of the stinging pain although she does not think it was any better on the [facility]. ooShe comes into clinic today with a area on the medial left great toe. She said she noticed blood on her sock last Saturday. She had some form of bunion surgery by Dr. Ila Mcgill her podiatrist sometime in 2019 she said he "shave the area". I  could not find his operative report although I did see reference to the bunion in the area. Objective Constitutional Vitals Time Taken: 9:08 AM, Height: 69 in, Weight: 163 lbs, BMI: 24.1, Temperature: 97.9 F, Pulse: 74 bpm, Respiratory Rate: 16 breaths/min, Blood Pressure: 127/71 mmHg. Cardiovascular Pedal pulses are palpable on both sides. General Notes: Wound exam; the area in question is at the smaller superficial area on the right lateral calf. Again 100% covered in a very fibrinous debris with some eschar around the wound edge. I used a #3 curette to debride this and after discussion with her we will move back to Iodoflex there is no surrounding infection ooThe new areas on the medial part of her first metatarsal head. Necrotic surface eschar again removed with a #3 curette eschar around the wound removed. Hemostasis with direct pressure. I do not think there is any infection here either. Integumentary (Hair, Skin) There is erythema around the wound on the left medial foot but no real tenderness. I suspect this is from friction with her foot wear which was really not appropriate.. Wound #3 status is Open. Original cause of wound was Gradually Appeared. The wound is located on the Right,Lateral Lower Leg. The wound measures 0.9cm length x 0.8cm width x 0.2cm depth; 0.565cm^2 area and 0.113cm^3 volume. There is Fat Layer (Subcutaneous Tissue) Exposed exposed. There is no tunneling or undermining noted. There is a medium amount of serosanguineous drainage noted. The wound margin is flat and intact. There is small (1-33%) pink granulation within the wound bed. There is a large (67-100%) amount of necrotic tissue within the wound bed including Adherent Slough. Wound #4 status is Open. Original cause of wound was Shear/Friction. The wound is located on the Left,Medial Foot. The wound measures 0.2cm length x 0.4cm width x 0.1cm depth; 0.063cm^2 area and 0.006cm^3 volume. There is no tunneling  or undermining noted. There is a small amount of serosanguineous drainage noted. The wound margin is flat and intact. There is large (67-100%) red granulation within the wound bed. There is no necrotic tissue within the wound bed. Assessment Active Problems ICD-10 Chronic venous hypertension (idiopathic) with inflammation of right lower extremity Non-pressure chronic ulcer of other part of right lower leg with fat layer exposed Non-pressure chronic ulcer of other part of left foot with fat layer exposed Procedures Wound #3 Pre-procedure diagnosis of Wound #3 is a Venous Leg Ulcer located  on the Right,Lateral Lower Leg .Severity of Tissue Pre Debridement is: Fat layer exposed. There was a Excisional Skin/Subcutaneous Tissue Debridement with a total area of 0.72 sq cm performed by [name]., MD. With the following instrument(s): Curette to remove Viable and Non-Viable tissue/material. Material removed includes Subcutaneous Tissue and Slough and after achieving pain control using Other (benzocaine, 20%). No specimens were taken. A time out was conducted at 09:55, prior to the start of the procedure. A Minimum amount of bleeding was controlled with Pressure. The procedure was tolerated well with a pain level of 0 throughout and a pain level of 0 following the procedure. Post Debridement Measurements: 0.9cm length x 0.8cm width x 0.2cm depth; 0.113cm^3 volume. Character of Wound/Ulcer Post Debridement is improved. Severity of Tissue Post Debridement is: Fat layer exposed. Post procedure Diagnosis Wound #3: Same as Pre-Procedure Pre-procedure diagnosis of Wound #3 is a Venous Leg Ulcer located on the Right,Lateral Lower Leg . There was a Three Layer Compression Therapy Procedure by Deon Pilling, RN. Post procedure Diagnosis Wound #3: Same as Pre-Procedure Wound #4 Pre-procedure diagnosis of Wound #4 is a Pressure Ulcer located on the Left,Medial Foot . There was a  Excisional Skin/Subcutaneous Tissue Debridement with a total area of 0.25 sq cm performed by [name]., MD. With the following instrument(s): Curette to remove Viable and Non-Viable tissue/material. Material removed includes Subcutaneous Tissue and Slough and after achieving pain control using Other (benzocaine, 20%). No specimens were taken. A time out was conducted at 09:55, prior to the start of the procedure. A Minimum amount of bleeding was controlled with Pressure. The procedure was tolerated well with a pain level of 0 throughout and a pain level of 0 following the procedure. Post Debridement Measurements: 0.5cm length x 0.5cm width x 0.1cm depth; 0.02cm^3 volume. Post debridement Stage noted as Category/Stage II. Character of Wound/Ulcer Post Debridement is improved. Post procedure Diagnosis Wound #4: Same as Pre-Procedure Plan Follow-up Appointments: Return Appointment in 1 week. - Friday Dressing Change Frequency: Wound #4 Left,Medial Foot: Change Dressing every other day. - only to left medial foot wound Wound #3 Right,Lateral Lower Leg: Do not change entire dressing for one week. Skin Barriers/Peri-Wound Care: Moisturizing lotion TCA Cream or Ointment - mixed with lotion to leg Wound Cleansing: May shower with protection. - cast protector Primary Wound Dressing: Wound #3 Right,Lateral Lower Leg: Iodoflex Wound #4 Left,Medial Foot: Iodoflex Secondary Dressing: Wound #3 Right,Lateral Lower Leg: Dry Gauze Wound #4 Left,Medial Foot: Foam - or tape Dry Gauze Edema Control: 3 Layer Compression System - Right Lower Extremity Avoid standing for long periods of time Elevate legs to the level of the heart or above for 30 minutes daily and/or when sitting, a frequency of: - throughout the day. Exercise regularly Off-Loading: Open toe surgical shoe to: - needs show for left foot 1. New wound in the setting of the left medial first metatarsal. This is clearly the  site of her previous bunion surgery. Although the history only goes back 5 days at this point the condition of the wound would suggest that this has been there a lot longer than that. Small wound but with a completely nonviable surface. It took a difficult debridement to get this down to healthy looking perfused tissue. 2. The original wound on the right lateral calf predominantly a trauma wound with venous physiology. After debridement I changed her back to Iodoflex we clearly deteriorated with the [facility] area this also required debridement 3. I gave her  a surgical shoe for the left foot to keep the pressure off this area Electronic Signature(s) Signed: 01/25/2020 5:45:04 PM By: Tracy Ham MD Entered By: [name] on 01/25/2020 10:16:31 -------------------------------------------------------------------------------- SuperBill Details Patient Name: Date of Service: [name] 01/25/2020 Medical Record [id number] Patient Account Number: [id number] Date of Birth/Sex: Treating RN: 11/10/1933 (86 y.o. [name]: [name]: Referring Green: Treating Green/Extender:Kayna Suppa, [name], [name] in Treatment: 5 Diagnosis Coding ICD-10 Codes Code Description [id number] Chronic venous hypertension (idiopathic) with inflammation of right lower extremity L97.812 Non-pressure chronic ulcer of other part of right lower leg with fat layer exposed L97.522 Non-pressure chronic ulcer of other part of left foot with fat layer exposed Facility Procedures The patient participates with Medicare or their insurance follows the Medicare Facility Guidelines: CPT4 Code Description Modifier Quantity [id number] - DEB SUBQ TISSUE 20 SQ CM/< 1 ICD-10 Diagnosis Description [id number] Non-pressure chronic ulcer of  other part of right lower leg with fat layer exposed L97.522 Non-pressure chronic ulcer of other part of  left foot with fat layer exposed Physician Procedures CPT4 Code Description: [id number] - WC PHYS LEVEL 3 - EST PT ICD-10 Diagnosis Description I87.321 Chronic venous hypertension (idiopathic) with inflammation extremity L97.812 Non-pressure chronic ulcer of other part of right lower le L97.522  Non-pressure chronic ulcer of other part of left foot with Modifier: 25 of right lowe g with fat lay fat layer exp Quantity: 1 r er exposed osed CPT4 Code Description: [id number] - WC PHYS SUBQ TISS 20 SQ CM ICD-10 Diagnosis Description L97.812 Non-pressure chronic ulcer of other part of right lower le L97.522 Non-pressure chronic ulcer of other part of left foot with Modifier: g with fat lay fat layer exp Quantity: 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

## 2020-01-28 NOTE — Progress Notes (Signed)
[name] ([id number]) Visit Report for 01/25/2020 Arrival Information Details Patient Name: Date of Service: [name], [name] 01/25/2020 8:45 AM Medical Record [id number] Patient Account Number: [id number] Date of Birth/Sex: Treating RN: 05/31/1933 (86 y.o. [name] Primary Care Ala Kratz: Lajean Manes T Other Clinician: Referring Lilymae Swiech: Treating Jenean Escandon/Extender:Robson, Secundino Ginger, Hal T Weeks in Treatment: 5 Visit Information History Since Last Visit Added or deleted any medications: No Patient Arrived: Ambulatory Any new allergies or adverse reactions: No Arrival Time: 09:05 Had a fall or experienced change in No Accompanied By: alone activities of daily living that may affect Transfer Assistance: None risk of falls: Patient Identification Verified: Yes Signs or symptoms of abuse/neglect since last No Secondary Verification Process Yes visito Completed: Hospitalized since last visit: No Patient Requires Transmission-Based No Implantable device outside of the clinic excluding No Precautions: cellular tissue based products placed in the center Patient Has Alerts: Yes since last visit: Patient Alerts: Patient on Blood Has Dressing in Place as Prescribed: Yes Thinner Has Compression in Place as Prescribed: Yes Right ABI:0.99 Pain Present Now: No Electronic Signature(s) Signed: 01/28/2020 6:14:31 PM By: [name] RN, BSN Entered By: [name] on 01/25/2020 09:06:13 -------------------------------------------------------------------------------- Compression Therapy Details Patient Name: Date of Service: [name], [name] 01/25/2020 8:45 AM Medical Record [id number] Patient Account Number: [id number] Date of Birth/Sex: Treating RN: 06-10-1933 (86 y.o. [name] Primary Care Ryelan Kazee: Patrina Levering Other Clinician: Referring Ciclaly Mulcahey: Treating Jabri Blancett/Extender:Robson, Secundino Ginger, Hal T Weeks in  Treatment: 5 Compression Therapy Performed for Wound Wound #3 Right,Lateral Lower Leg Assessment: Performed By: Clinician Deon Pilling, RN Compression Type: Three Layer Post Procedure Diagnosis Same as Pre-procedure Electronic Signature(s) Signed: 01/28/2020 6:12:41 PM By: Kela Millin Entered By: Kela Millin on 01/25/2020 10:06:12 -------------------------------------------------------------------------------- Encounter Discharge Information Details Patient Name: Date of Service: [name] 01/25/2020 8:45 AM Medical Record [id number] Patient Account Number: [id number] Date of Birth/Sex: Treating RN: 02-12-1933 (86 y.o. [name] Primary Care Lizzette Carbonell: Patrina Levering Other Clinician: Referring Paislyn Domenico: Treating Jaide Hillenburg/Extender:Robson, Secundino Ginger, Hal T Weeks in Treatment: 5 Encounter Discharge Information Items Post Procedure Vitals Discharge Condition: Stable Temperature (F): 97.9 Ambulatory Status: Ambulatory Pulse (bpm): 74 Discharge Destination: Home Respiratory Rate (breaths/min): 16 Transportation: Private Auto Blood Pressure (mmHg): 127/71 Accompanied By: self Schedule Follow-up Appointment: Yes Clinical Summary of Care: Electronic Signature(s) Signed: 01/25/2020 5:41:48 PM By: Deon Pilling Entered By: Deon Pilling on 01/25/2020 10:35:14 -------------------------------------------------------------------------------- Lower Extremity Assessment Details Patient Name: Date of Service: [name] 01/25/2020 8:45 AM Medical Record [id number] Patient Account Number: [id number] Date of Birth/Sex: Treating RN: 11-Nov-1933 (86 y.o. [name] Primary Care Johnisha Louks: Lajean Manes T Other Clinician: Referring Binyomin Brann: Treating Isatou Agredano/Extender:Robson, Secundino Ginger, Hal T Weeks in Treatment: 5 Edema Assessment Assessed: [Left: No] [Right: No] Edema: [Left: N] [Right: o] Calf Left: Right: Point of  Measurement: 42 cm From Medial Instep cm 31 cm Ankle Left: Right: Point of Measurement: 13 cm From Medial Instep cm 18.5 cm Vascular Assessment Pulses: Dorsalis Pedis Palpable: [Right:Yes] Electronic Signature(s) Signed: 01/28/2020 6:14:31 PM By: [name] RN, BSN Entered By: [name] on 01/25/2020 09:14:52 -------------------------------------------------------------------------------- Multi Wound Chart Details Patient Name: Date of Service: [name], [name] 01/25/2020 8:45 AM Medical Record [id number] Patient Account Number: [id number] Date of Birth/Sex: Treating RN: July 08, 1933 (86 y.o. [name] Primary Care Terell Kincy: Patrina Levering Other Clinician: Referring Denielle Bayard: Treating Heberto Sturdevant/Extender:Robson, Secundino Ginger, Hal T Weeks in Treatment: 5 Vital Signs Height(in): 69 Pulse(bpm): 74 Weight(lbs): 163 Blood Pressure(mmHg): 127/71  Body Mass Index(BMI): 24 Temperature(F): 97.9 Respiratory 16 Rate(breaths/min): Photos: [3:No Photos] [4:No Photos] [N/A:N/A] Wound Location: [3:Right Lower Leg - Lateral Left Foot - Medial] [N/A:N/A] Wounding Event: [3:Gradually Appeared] [4:Shear/Friction] [N/A:N/A] Primary Etiology: [3:Venous Leg Ulcer] [4:Pressure Ulcer] [N/A:N/A] Comorbid History: [3:Cataracts, Anemia, Arrhythmia, Congestive Heart Failure, Hypertension, Heart Failure, Hypertension, Peripheral Venous Disease, Peripheral Venous Disease, Received Radiation] [4:Cataracts, Anemia, Arrhythmia, Congestive Received  Radiation] [N/A:N/A] Date Acquired: [3:11/19/2019] [4:01/19/2020] [N/A:N/A] Weeks of Treatment: [3:5] [4:0] [N/A:N/A] Wound Status: [3:Open] [4:Open] [N/A:N/A] Measurements L x W x D 0.9x0.8x0.2 [4:0.2x0.4x0.1] [N/A:N/A] (cm) Area (cm) : [3:0.565] [4:0.063] [N/A:N/A] Volume (cm) : [3:0.113] [4:0.006] [N/A:N/A] % Reduction in Area: [3:-2.70%] [4:N/A] [N/A:N/A] % Reduction in Volume: [3:-105.50%] [4:N/A] [N/A:N/A] Classification:  [3:Full Thickness Without Exposed Support Structures] [4:Category/Stage II] [N/A:N/A] Exudate Amount: [3:Medium] [4:Small] [N/A:N/A] Exudate Type: [3:Serosanguineous] [4:Serosanguineous] [N/A:N/A] Exudate Color: [3:red, brown] [4:red, brown] [N/A:N/A] Wound Margin: [3:Flat and Intact] [4:Flat and Intact] [N/A:N/A] Granulation Amount: [3:Small (1-33%)] [4:Large (67-100%)] [N/A:N/A] Granulation Quality: [3:Pink] [4:Red] [N/A:N/A] Necrotic Amount: [3:Large (67-100%)] [4:None Present (0%)] [N/A:N/A] Exposed Structures: [3:Fat Layer (Subcutaneous Fascia: No Tissue) Exposed: Yes Fascia: No Tendon: No Muscle: No Joint: No Bone: No] [4:Fat Layer (Subcutaneous Tissue) Exposed: No Tendon: No Muscle: No Joint: No Bone: No] [N/A:N/A] Epithelialization: [3:Small (1-33%)] [4:None] [N/A:N/A] Debridement: [3:Debridement - Excisional Debridement - Excisional] [N/A:N/A] Pre-procedure [3:09:55] [4:09:55] [N/A:N/A] Verification/Time Out Taken: Pain Control: [3:Other] [4:Other] [N/A:N/A] Tissue Debrided: [3:Subcutaneous, Slough] [4:Subcutaneous, Slough] [N/A:N/A] Level: [3:Skin/Subcutaneous Tissue] [4:Skin/Subcutaneous Tissue] [N/A:N/A] Debridement Area (sq cm):0.72 [4:0.25] [N/A:N/A] Instrument: [3:Curette] [4:Curette] [N/A:N/A] Bleeding: [3:Minimum] [4:Minimum] [N/A:N/A] Hemostasis Achieved: [3:Pressure] [4:Pressure] [N/A:N/A] Procedural Pain: [3:0] [4:0] [N/A:N/A] Post Procedural Pain: [3:0] [4:0] [N/A:N/A] Debridement Treatment Procedure was tolerated [4:Procedure was tolerated] [N/A:N/A] Response: [3:well] [4:well] Post Debridement [3:0.9x0.8x0.2] [4:0.5x0.5x0.1] [N/A:N/A] Measurements L x W x D (cm) Post Debridement [3:0.113] [4:0.02] [N/A:N/A] Volume: (cm) Post Debridement Stage: N/A [4:Category/Stage II] [N/A:N/A] Procedures Performed: Compression Therapy [3:Debridement] [4:Debridement] [N/A:N/A] Treatment Notes Electronic Signature(s) Signed: 01/25/2020 5:45:04 PM By: Linton Ham  MD Signed: 01/28/2020 6:12:41 PM By: Kela Millin Entered By: Linton Ham on 01/25/2020 10:11:51 -------------------------------------------------------------------------------- Multi-Disciplinary Care Plan Details Patient Name: Date of Service: [name] 01/25/2020 8:45 AM Medical Record [id number] Patient Account Number: [id number] Date of Birth/Sex: Treating RN: 02/19/1933 (86 y.o. [name] Primary Care Teirra Carapia: Patrina Levering Other Clinician: Referring Arun Herrod: Treating Miloh Alcocer/Extender:Robson, Secundino Ginger, Hal T Weeks in Treatment: 5 Active Inactive Abuse / Safety / Falls / Self Care Management Nursing Diagnoses: Potential for falls Goals: Patient/caregiver will verbalize understanding of skin care regimen Date Initiated: 12/20/2019 Target Resolution Date: 02/22/2020 Goal Status: Active Patient/caregiver will verbalize/demonstrate understanding of what to do in case of emergency Date Initiated: 12/20/2019 Target Resolution Date: 02/22/2020 Goal Status: Active Interventions: Assess fall risk on admission and as needed Provide education on fall prevention Notes: Pain, Acute or Chronic Nursing Diagnoses: Pain, acute or chronic: actual or potential Potential alteration in comfort, pain Goals: Patient will verbalize adequate pain control and receive pain control interventions during procedures as needed Date Initiated: 12/20/2019 Target Resolution Date: 02/22/2020 Goal Status: Active Patient/caregiver will verbalize comfort level met Date Initiated: 12/20/2019 Target Resolution Date: 02/22/2020 Goal Status: Active Interventions: Complete pain assessment as per visit requirements Provide education on pain management Notes: Wound/Skin Impairment Nursing Diagnoses: Knowledge deficit related to ulceration/compromised skin integrity Goals: Patient/caregiver will verbalize understanding of skin care regimen Date Initiated:  12/20/2019 Target Resolution Date: 02/22/2020 Goal Status: Active Interventions: Assess patient/caregiver ability to perform ulcer/skin care regimen upon admission and as needed Provide education  on ulcer and skin care Treatment Activities: Skin care regimen initiated : 12/20/2019 Topical wound management initiated : 12/20/2019 Notes: Electronic Signature(s) Signed: 01/28/2020 6:12:41 PM By: Kela Millin Entered By: Kela Millin on 01/25/2020 09:47:54 -------------------------------------------------------------------------------- Pain Assessment Details Patient Name: Date of Service: SADIYAH, KANGAS 01/25/2020 8:45 AM Medical Record [id number] Patient Account Number: [id number] Date of Birth/Sex: Treating RN: Feb 07, 1933 (86 y.o. [name] Primary Care Conya Ellinwood: Patrina Levering Other Clinician: Referring Edda Orea: Treating Janel Beane/Extender:Robson, Secundino Ginger, Hal T Weeks in Treatment: 5 Active Problems Location of Pain Severity and Description of Pain Patient Has Paino No Site Locations Pain Management and Medication Current Pain Management: Electronic Signature(s) Signed: 01/28/2020 6:14:31 PM By: [name] RN, BSN Entered By: [name] on 01/25/2020 09:06:20 -------------------------------------------------------------------------------- Patient/Caregiver Education Details Patient Name: Date of Service: [name] 2/12/2021andnbsp8:45 AM Medical Record [id number] Patient Account Number: [id number] Date of Birth/Gender: 1933/02/21 (86 y.o. F) Treating RN: Kela Millin Primary Care Physician: Patrina Levering Other Clinician: Referring Physician: Treating Physician/Extender:Robson, Secundino Ginger, Junius Creamer in Treatment: 5 Education Assessment Education Provided To: Patient Education Topics Provided Pain: Methods: Explain/Verbal Responses: State content correctly Safety: Methods: Explain/Verbal Responses:  State content correctly Wound/Skin Impairment: Methods: Explain/Verbal Responses: State content correctly Electronic Signature(s) Signed: 01/28/2020 6:12:41 PM By: Kela Millin Entered By: Kela Millin on 01/25/2020 09:48:22 -------------------------------------------------------------------------------- Wound Assessment Details Patient Name: Date of Service: Ortlieb, Cressie J. 01/25/2020 8:45 AM Medical Record [id number] Patient Account Number: [id number] Date of Birth/Sex: Treating RN: Oct 11, 1933 (86 y.o. [name] Primary Care Maripaz Mullan: Lajean Manes T Other Clinician: Referring Fredrich Cory: Treating Arshad Oberholzer/Extender:Robson, Secundino Ginger, Hal T Weeks in Treatment: 5 Wound Status Wound Number: 3 Primary Venous Leg Ulcer Etiology: Wound Location: Right Lower Leg - Lateral Wound Open Wounding Event: Gradually Appeared Status: Date Acquired: 11/19/2019 ComorbidCataracts, Anemia, Arrhythmia, Congestive Weeks Of Treatment: 5 Weeks Of Treatment: 5 History: Heart Failure, Hypertension, Peripheral Clustered Wound: No Venous Disease, Received Radiation Photos Wound Measurements Length: (cm) 0.9 % Reduct Width: (cm) 0.8 % Reduct Depth: (cm) 0.2 Epitheli Area: (cm) 0.565 Tunneli Volume: (cm) 0.113 Undermi Wound Description Full Thickness Without Exposed Support Foul Odo Classification: Structures Slough/F Wound Flat and Intact Margin: Exudate Medium Amount: Exudate Serosanguineous Type: Exudate red, brown Color: Wound Bed Granulation Amount: Small (1-33%) Granulation Quality: Pink Fascia E Necrotic Amount: Large (67-100%) Fat Laye Necrotic Quality: Adherent Slough Tendon E Muscle E Joint Ex Bone Exp r After Cleansing: No ibrino Yes Exposed Structure xposed: No r (Subcutaneous Tissue) Exposed: Yes xposed: No xposed: No posed: No osed: No ion in Area: -2.7% ion in Volume: -105.5% alization: Small (1-33%) ng: No ning:  No Treatment Notes Wound #3 (Right, Lateral Lower Leg) 1. Cleanse With Wound Cleanser Soap and water 2. Periwound Care TCA Cream 3. Primary Dressing Applied Iodoflex 4. Secondary Dressing Dry Gauze 6. Support Layer Applied 3 layer compression wrap Notes 2 layer compression [place] wrap used. Electronic Signature(s) Signed: 01/25/2020 4:39:06 PM By: Mikeal Hawthorne EMT/HBOT Signed: 01/28/2020 6:12:41 PM By: Kela Millin Entered By: Mikeal Hawthorne on 01/25/2020 15:05:37 -------------------------------------------------------------------------------- Wound Assessment Details Patient Name: Date of Service: Wenrick, Kenidy J. 01/25/2020 8:45 AM Medical Record [id number] Patient Account Number: [id number] Date of Birth/Sex: Treating RN: 08-02-1933 (86 y.o. [name] Primary Care Couper Juncaj: Patrina Levering Other Clinician: Referring Lamanda Rudder: Treating Bertel Venard/Extender:Robson, Secundino Ginger, Hal T Weeks in Treatment: 5 Wound Status Wound Number: 4 Primary Pressure Ulcer Etiology: Wound Location: Left Foot - Medial Wound Open Wounding Event: Shear/Friction Status: Date Acquired:  01/19/2020 Comorbid Cataracts, Anemia, Arrhythmia, Congestive Weeks Of Treatment: 0 History: Heart Failure, Hypertension, Peripheral Clustered Wound: No Venous Disease, Received Radiation Photos Wound Measurements Length: (cm) 0.2 Width: (cm) 0.4 Depth: (cm) 0.1 Area: (cm) 0.063 Volume: (cm) 0.006 Wound Description Classification: Category/Stage II Wound Margin: Flat and Intact Exudate Amount: Small Exudate Type: Serosanguineous Exudate Color: red, brown Wound Bed Granulation Amount: Large (67-100%) Granulation Quality: Red Necrotic Amount: None Present (0%) After Cleansing: No brino No Exposed Structure osed: No (Subcutaneous Tissue) Exposed: No osed: No xposed: No posed: No osed: No % Reduction in Area: 0% % Reduction in Volume: 0% Epithelialization:  None Tunneling: No Undermining: No Foul Odor Slough/Fi Fascia Exp Fat Layer Tendon Exp Muscle E Joint Ex Bone Exp Treatment Notes Wound #4 (Left, Medial Foot) 1. Cleanse With Wound Cleanser 3. Primary Dressing Applied Iodoflex 4. Secondary Dressing Dry Gauze Roll Gauze Foam 5. Secured With Medipore tape Notes explained how to apply dressing. patient in agreement. Electronic Signature(s) Signed: 01/25/2020 4:39:06 PM By: Mikeal Hawthorne EMT/HBOT Signed: 01/28/2020 6:12:41 PM By: Kela Millin Entered By: Mikeal Hawthorne on 01/25/2020 15:05:19 -------------------------------------------------------------------------------- Vitals Details Patient Name: Date of Service: [name] 01/25/2020 8:45 AM Medical Record [id number] Patient Account Number: [id number] Date of Birth/Sex: Treating RN: 01-15-1933 (86 y.o. [name] Primary Care Enrique Weiss: Lajean Manes T Other Clinician: Referring Adrianah Prophete: Treating Jory Welke/Extender:Robson, Secundino Ginger, Hal T Weeks in Treatment: 5 Vital Signs Time Taken: 09:08 Temperature (F): 97.9 Height (in): 69 Pulse (bpm): 74 Weight (lbs): 163 Respiratory Rate (breaths/min): 16 Body Mass Index (BMI): 24.1 Blood Pressure (mmHg): 127/71 Reference Range: 80 - 120 mg / dl Electronic Signature(s) Signed: 01/28/2020 6:14:31 PM By: [name] RN, BSN Entered By: [name] on 01/25/2020 09:08:39

## 2020-02-01 ENCOUNTER — Other Ambulatory Visit: Payer: Self-pay

## 2020-02-01 ENCOUNTER — Encounter (HOSPITAL_BASED_OUTPATIENT_CLINIC_OR_DEPARTMENT_OTHER): Payer: Medicare Other | Admitting: Internal Medicine

## 2020-02-01 DIAGNOSIS — L97812 Non-pressure chronic ulcer of other part of right lower leg with fat layer exposed: Secondary | ICD-10-CM | POA: Diagnosis not present

## 2020-02-04 NOTE — Progress Notes (Signed)
SYBEL, KOSTIUK ([id number]) Visit Report for 02/01/2020 HPI Details Patient Name: Date of Service: [name], [name] 02/01/2020 9:00 AM Medical Record [id number] Patient Account Number: [id number] Date of Birth/Sex: Treating RN: 01/24/1933 (86 y.o. [name] Primary Care Provider: Patrina Levering Other Clinician: Referring Provider: Treating Provider/Extender:Dinia Joynt, Secundino Ginger, Adelfa Koh Weeks in Treatment: 6 History of Present Illness HPI Description: ADMISSION 12/20/2019 This is an 86 year old woman referred by her primary physician Dr. Felipa Eth for review of a wound on her right lateral lower leg. She was actually in this clinic on 2 separate occasions in 2010 and 2012 cared for by Dr. Sherilyn Cooter. At that point in time she had wounds on her right leg as well. She tells [place] to 1 month ago she noticed a scab building up on her right lateral lower leg this opened into a wound. There was no overt cause of this no trauma, no infection that she is aware of. She has a history of chronic venous insufficiency and wears compression stockings fairly religiously indeed she is done well over the last 8 years since she was last in this clinic. She has been applying Vaseline on this and a covering phone. This is not progressing towards healing. Past medical history; interstitial lung disease, chronic atrial fibrillation status post pacemaker, osteoarthritis of the left knee, left breast CA, chronic repeat venous insufficiency. She takes Eliquis for her atrial fibrillation stroke prophylaxis. ABI in our clinic was 0.99 on the right 1/15; superficial wound on the right lateral calf in the setting of severe acute skin changes from chronic venous insufficiency and lymphedema. We used Iodoflex last week she complained of a lot of pain 1/22; this is a small but difficult wound on the right lateral calf in the setting of severe chronic venous insufficiency and secondary lymphedema. She  continues to state the wounds things hurts when she is up on it but seems to be relieved by putting her leg up. I changed her to [facility] last week because the Iodoflex seem to be causing stinging. 1/29. This wound appears to be contracting somewhat. Changes of chronic venous insufficiency with secondary lymphedema 2/12; not as good as surface today and slightly bigger. I had to change her from Iodoflex to [facility] because of the stinging pain although she does not think it was any better on the [facility]. She comes into clinic today with a area on the medial left great toe. She said she noticed blood on her sock last Saturday. She had some form of bunion surgery by Dr. Ila Mcgill her podiatrist sometime in 2019 she said he "shave the area". I could not find his operative report although I did see reference to the bunion in the area. 2/19; somewhat improved wound on the right lateral lower leg however the wound she came in on the bunion of her left great toe is actually I think larger still somewhat inflamed. We have been using Iodoflex Electronic Signature(s) Signed: 02/01/2020 6:29:59 PM By: Linton Ham MD Entered By: Linton Ham on 02/01/2020 10:10:31 -------------------------------------------------------------------------------- Physical Exam Details Patient Name: Date of Service: [name], Tracy J. 02/01/2020 9:00 AM Medical Record [id number] Patient Account Number: [id number] Date of Birth/Sex: Treating RN: 07/18/1933 (86 y.o. [name] Primary Care Provider: Patrina Levering Other Clinician: Referring Provider: Treating Provider/Extender:Rudolph Dobler, Secundino Ginger, Hal T Weeks in Treatment: 6 Constitutional Patient is hypertensive.. Pulse regular and within target range for patient.Marland Kitchen Respirations regular, non-labored and within target range.. Temperature is normal and  within the target range for the patient.Marland Kitchen Appears in no  distress. Cardiovascular Pedal pulses are palpable. Notes Wound exam; the area in question is on right lateral calf. The surface of this actually looks better this week. It was debrided with Anasept and gauze but fortunately no mechanical debridement. The area on the medial part of her first metatarsal head is superficial but again and necrotic surface. I did not debride this. I am not completely certain she is offloading this area as it looks somewhat inflamed but not overtly infected Electronic Signature(s) Signed: 02/01/2020 6:29:59 PM By: Linton Ham MD Entered By: Linton Ham on 02/01/2020 10:12:19 -------------------------------------------------------------------------------- Physician Orders Details Patient Name: Date of Service: [name] 02/01/2020 9:00 AM Medical Record [id number] Patient Account Number: [id number] Date of Birth/Sex: Treating RN: 09-11-1933 (86 y.o. [name] Primary Care Provider: Lajean Manes T Other Clinician: Referring Provider: Treating Provider/Extender:Guelda Batson, Secundino Ginger, Hal T Weeks in Treatment: 6 Verbal / Phone Orders: No Diagnosis Coding ICD-10 Coding Code Description I87.321 Chronic venous hypertension (idiopathic) with inflammation of right lower extremity L97.812 Non-pressure chronic ulcer of other part of right lower leg with fat layer exposed L97.522 Non-pressure chronic ulcer of other part of left foot with fat layer exposed Follow-up Appointments Return Appointment in 1 week. - Friday Dressing Change Frequency Wound #3 Right,Lateral Lower Leg Do not change entire dressing for one week. Wound #4 Left,Medial Foot Change Dressing every other day. - only to left medial foot wound Skin Barriers/Peri-Wound Care Moisturizing lotion TCA Cream or Ointment - mixed with lotion to leg Wound Cleansing May shower with protection. - cast protector Primary Wound Dressing Wound #3 Right,Lateral Lower  Leg Iodoflex Wound #4 Left,Medial Foot Iodoflex Secondary Dressing Wound #3 Right,Lateral Lower Leg Dry Gauze Wound #4 Left,Medial Foot Foam - or tape Dry Gauze Edema Control 3 Layer Compression System - Right Lower Extremity Avoid standing for long periods of time Elevate legs to the level of the heart or above for 30 minutes daily and/or when sitting, a frequency of: - throughout the day. Exercise regularly Off-Loading Open toe surgical shoe to: - needs show for left foot Electronic Signature(s) Signed: 02/01/2020 6:29:59 PM By: Linton Ham MD Signed: 02/04/2020 1:40:10 PM By: Kela Millin Entered By: Kela Millin on 02/01/2020 09:15:43 -------------------------------------------------------------------------------- Problem List Details Patient Name: Date of Service: [name]. 02/01/2020 9:00 AM Medical Record [id number] Patient Account Number: [id number] Date of Birth/Sex: Treating RN: 27-Sep-1933 (86 y.o. [name] Primary Care Provider: Lajean Manes T Other Clinician: Referring Provider: Treating Provider/Extender:Zakery Normington, Secundino Ginger, Junius Creamer in Treatment: 6 Active Problems ICD-10 Evaluated Encounter Code Description Active Date Today Diagnosis I87.321 Chronic venous hypertension (idiopathic) with 12/20/2019 No Yes inflammation of right lower extremity L97.812 Non-pressure chronic ulcer of other part of right lower 12/20/2019 No Yes leg with fat layer exposed L97.522 Non-pressure chronic ulcer of other part of left foot 01/25/2020 No Yes with fat layer exposed Inactive Problems Resolved Problems Electronic Signature(s) Signed: 02/01/2020 6:29:59 PM By: Linton Ham MD Entered By: Linton Ham on 02/01/2020 10:09:45 -------------------------------------------------------------------------------- Progress Note Details Patient Name: Date of Service: [name] 02/01/2020 9:00 AM Medical Record [id number]  Patient Account Number: [id number] Date of Birth/Sex: Treating RN: [birth year] (86 y.o. [name] Primary Care Provider: Patrina Levering Other Clinician: Referring Provider: Treating Provider/Extender:Kelen Laura, Secundino Ginger, Hal T Weeks in Treatment: 6 Subjective History of Present Illness (HPI) ADMISSION 12/20/2019 This is an 86 year old woman referred by her primary physician Dr. Felipa Eth for  review of a wound on her right lateral lower leg. She was actually in this clinic on 2 separate occasions in 2010 and 2012 cared for by Dr. Sherilyn Cooter. At that point in time she had wounds on her right leg as well. She tells [place] to 1 month ago she noticed a scab building up on her right lateral lower leg this opened into a wound. There was no overt cause of this no trauma, no infection that she is aware of. She has a history of chronic venous insufficiency and wears compression stockings fairly religiously indeed she is done well over the last 8 years since she was last in this clinic. She has been applying Vaseline on this and a covering phone. This is not progressing towards healing. Past medical history; interstitial lung disease, chronic atrial fibrillation status post pacemaker, osteoarthritis of the left knee, left breast CA, chronic repeat venous insufficiency. She takes Eliquis for her atrial fibrillation stroke prophylaxis. ABI in our clinic was 0.99 on the right 1/15; superficial wound on the right lateral calf in the setting of severe acute skin changes from chronic venous insufficiency and lymphedema. We used Iodoflex last week she complained of a lot of pain 1/22; this is a small but difficult wound on the right lateral calf in the setting of severe chronic venous insufficiency and secondary lymphedema. She continues to state the wounds things hurts when she is up on it but seems to be relieved by putting her leg up. I changed her to [facility] last week because the  Iodoflex seem to be causing stinging. 1/29. This wound appears to be contracting somewhat. Changes of chronic venous insufficiency with secondary lymphedema 2/12; not as good as surface today and slightly bigger. I had to change her from Iodoflex to [facility] because of the stinging pain although she does not think it was any better on the [facility]. ooShe comes into clinic today with a area on the medial left great toe. She said she noticed blood on her sock last Saturday. She had some form of bunion surgery by Dr. Ila Mcgill her podiatrist sometime in 2019 she said he "shave the area". I could not find his operative report although I did see reference to the bunion in the area. 2/19; somewhat improved wound on the right lateral lower leg however the wound she came in on the bunion of her left great toe is actually I think larger still somewhat inflamed. We have been using Iodoflex Objective Constitutional Patient is hypertensive.. Pulse regular and within target range for patient.Marland Kitchen Respirations regular, non-labored and within target range.. Temperature is normal and within the target range for the patient.Marland Kitchen Appears in no distress. Vitals Time Taken: 9:06 AM, Height: 69 in, Weight: 163 lbs, BMI: 24.1, Temperature: 97.8 F, Pulse: 75 bpm, Respiratory Rate: 16 breaths/min, Blood Pressure: 152/76 mmHg. Cardiovascular Pedal pulses are palpable. General Notes: Wound exam; the area in question is on right lateral calf. The surface of this actually looks better this week. It was debrided with Anasept and gauze but fortunately no mechanical debridement. ooThe area on the medial part of her first metatarsal head is superficial but again and necrotic surface. I did not debride this. I am not completely certain she is offloading this area as it looks somewhat inflamed but not overtly infected Integumentary (Hair, Skin) Wound #3 status is Open. Original cause of wound was Gradually  Appeared. The wound is located on the Right,Lateral Lower Leg. The wound measures 1cm length  x 0.7cm width x 0.2cm depth; 0.55cm^2 area and 0.11cm^3 volume. There is Fat Layer (Subcutaneous Tissue) Exposed exposed. There is no tunneling or undermining noted. There is a medium amount of serosanguineous drainage noted. The wound margin is flat and intact. There is medium (34-66%) red granulation within the wound bed. There is a medium (34-66%) amount of necrotic tissue within the wound bed including Adherent Slough. Wound #4 status is Open. Original cause of wound was Shear/Friction. The wound is located on the Left,Medial Foot. The wound measures 0.7cm length x 0.9cm width x 0.1cm depth; 0.495cm^2 area and 0.049cm^3 volume. There is Fat Layer (Subcutaneous Tissue) Exposed exposed. There is no tunneling or undermining noted. There is a medium amount of serosanguineous drainage noted. The wound margin is flat and intact. There is small (1-33%) pink granulation within the wound bed. There is a large (67-100%) amount of necrotic tissue within the wound bed including Adherent Slough. Assessment Active Problems ICD-10 Chronic venous hypertension (idiopathic) with inflammation of right lower extremity Non-pressure chronic ulcer of other part of right lower leg with fat layer exposed Non-pressure chronic ulcer of other part of left foot with fat layer exposed Plan Follow-up Appointments: Return Appointment in 1 week. - Friday Dressing Change Frequency: Wound #3 Right,Lateral Lower Leg: Do not change entire dressing for one week. Wound #4 Left,Medial Foot: Change Dressing every other day. - only to left medial foot wound Skin Barriers/Peri-Wound Care: Moisturizing lotion TCA Cream or Ointment - mixed with lotion to leg Wound Cleansing: May shower with protection. - cast protector Primary Wound Dressing: Wound #3 Right,Lateral Lower Leg: Iodoflex Wound #4 Left,Medial Foot: Iodoflex Secondary  Dressing: Wound #3 Right,Lateral Lower Leg: Dry Gauze Wound #4 Left,Medial Foot: Foam - or tape Dry Gauze Edema Control: 3 Layer Compression System - Right Lower Extremity Avoid standing for long periods of time Elevate legs to the level of the heart or above for 30 minutes daily and/or when sitting, a frequency of: - throughout the day. Exercise regularly Off-Loading: Open toe surgical shoe to: - needs show for left foot 1. Iodoflex to both wound areas. This is to try and address the pliability of the surface. 2. The area on the right lower leg was never of certain etiology but she certainly has severe venous hypertension. She may need reflux studies. There is no evidence of an arterial issue 3. I did not see any evidence of infection here. Electronic Signature(s) Signed: 02/01/2020 6:29:59 PM By: Linton Ham MD Entered By: Linton Ham on 02/01/2020 10:13:12 -------------------------------------------------------------------------------- SuperBill Details Patient Name: Date of Service: [name] 02/01/2020 Medical Record [id number] Patient Account Number: [id number] Date of Birth/Sex: Treating RN: 09/18/1933 (86 y.o. [name] Primary Care Provider: Lajean Manes T Other Clinician: Referring Provider: Treating Provider/Extender:Jorden Minchey, Secundino Ginger, Hal T Weeks in Treatment: 6 Diagnosis Coding ICD-10 Codes Code Description [phone] Chronic venous hypertension (idiopathic) with inflammation of right lower extremity L97.812 Non-pressure chronic ulcer of other part of right lower leg with fat layer exposed L97.522 Non-pressure chronic ulcer of other part of left foot with fat layer exposed Facility Procedures The patient participates with Medicare or their insurance follows the Medicare Facility Guidelines: CPT4 Code Description Modifier Quantity [id number] [phone] - WOUND CARE VISIT-LEV 3 EST PT 1 Physician Procedures CPT4 Code Description:  [id number] - WC PHYS LEVEL 3 - EST PT ICD-10 Diagnosis Description I87.321 Chronic venous hypertension (idiopathic) with inflammatio extremity L97.812 Non-pressure chronic ulcer of other part of right lower l L97.522  Non-pressure  chronic ulcer of other part of left foot wit Modifier: n of right lower eg with fat laye h fat layer expo Quantity: 1 r exposed sed Electronic Signature(s) Signed: 02/01/2020 6:29:59 PM By: Linton Ham MD Entered By: Linton Ham on 02/01/2020 10:13:33

## 2020-02-06 ENCOUNTER — Other Ambulatory Visit: Payer: Self-pay | Admitting: Internal Medicine

## 2020-02-08 ENCOUNTER — Encounter (HOSPITAL_BASED_OUTPATIENT_CLINIC_OR_DEPARTMENT_OTHER): Payer: Medicare Other | Admitting: Internal Medicine

## 2020-02-08 ENCOUNTER — Other Ambulatory Visit: Payer: Self-pay

## 2020-02-08 DIAGNOSIS — L97812 Non-pressure chronic ulcer of other part of right lower leg with fat layer exposed: Secondary | ICD-10-CM | POA: Diagnosis not present

## 2020-02-08 NOTE — Progress Notes (Signed)
Tracy, Green ([id number]) Visit Report for 02/08/2020 Debridement Details Patient Name: Date of Service: [name], SUPPLEE 02/08/2020 9:15 AM Medical Record [id number] Patient Account Number: [id number] Date of Birth/Sex: Treating RN: [birth year] (86 y.o. [name] Primary Care Provider: Lajean Manes T Other Clinician: Referring Provider: Treating Provider/Extender:Reise Hietala, Secundino Ginger, Hal T Weeks in Treatment: 7 Debridement Performed for Wound #4 Left,Medial Foot Assessment: Performed By: Physician Ricard Dillon., MD Debridement Type: Debridement Level of Consciousness (Pre- Responds to Painful Stimuli procedure): Pre-procedure Yes - 10:15 Verification/Time Out Taken: Start Time: 10:15 Pain Control: Other : benzocaine, 20% Total Area Debrided (L x W): 0.7 (cm) x 1.2 (cm) = 0.84 (cm) Tissue and other material Viable, Non-Viable, Slough, Subcutaneous, Slough debrided: Level: Skin/Subcutaneous Tissue Debridement Description: Excisional Instrument: Curette Bleeding: Minimum Hemostasis Achieved: Pressure End Time: 10:16 Procedural Pain: 2 Post Procedural Pain: 0 Response to Treatment: Procedure was tolerated well Level of Consciousness Awake and Alert (Post-procedure): Post Debridement Measurements of Total Wound Length: (cm) 0.7 Stage: Category/Stage II Width: (cm) 1.2 Depth: (cm) 0.2 Volume: (cm) 0.132 Character of Wound/Ulcer Post Improved Debridement: Post Procedure Diagnosis Same as Pre-procedure Electronic Signature(s) Signed: 02/08/2020 5:17:01 PM By: Kela Millin Signed: 02/08/2020 5:26:28 PM By: Linton Ham MD Entered By: Kela Millin on 02/08/2020 10:18:03 -------------------------------------------------------------------------------- Debridement Details Patient Name: Date of Service: [name] 02/08/2020 9:15 AM Medical Record [id number] Patient Account Number: [id number] Date of  Birth/Sex: Treating RN: 02/07/1933 (86 y.o. [name] Primary Care Provider: Lajean Manes T Other Clinician: Referring Provider: Treating Provider/Extender:Ozelle Brubacher, Secundino Ginger, Hal T Weeks in Treatment: 7 Debridement Performed for Wound #3 Right,Lateral Lower Leg Assessment: Performed By: Physician Ricard Dillon., MD Debridement Type: Debridement Severity of Tissue Pre Fat layer exposed Debridement: Level of Consciousness (Pre- Responds to Painful Stimuli procedure): Pre-procedure Yes - 10:15 Verification/Time Out Taken: Start Time: 10:15 Pain Control: Other : benzocaine, 20% Total Area Debrided (L x W): 1 (cm) x 0.8 (cm) = 0.8 (cm) Tissue and other material Viable, Non-Viable, Slough, Subcutaneous, Slough debrided: Level: Skin/Subcutaneous Tissue Debridement Description: Excisional Instrument: Curette Bleeding: Minimum Hemostasis Achieved: Pressure End Time: 10:16 Procedural Pain: 2 Post Procedural Pain: 0 Response to Treatment: Procedure was tolerated well Level of Consciousness Awake and Alert (Post-procedure): Post Debridement Measurements of Total Wound Length: (cm) 1 Width: (cm) 0.8 Depth: (cm) 0.2 Volume: (cm) 0.126 Character of Wound/Ulcer Post Improved Debridement: Severity of Tissue Post Debridement: Fat layer exposed Post Procedure Diagnosis Same as Pre-procedure Electronic Signature(s) Signed: 02/08/2020 5:17:01 PM By: Kela Millin Signed: 02/08/2020 5:26:28 PM By: Linton Ham MD Entered By: Linton Ham on 02/08/2020 10:34:41 -------------------------------------------------------------------------------- HPI Details Patient Name: Date of Service: [name] 02/08/2020 9:15 AM Medical Record ON:[id number] Patient Account Number: [id number] Date of Birth/Sex: Treating RN: [birth year] (86 y.o. [name] Primary Care Provider: Patrina Levering Other Clinician: Referring Provider: Treating  Provider/Extender:Nanea Jared, Secundino Ginger, Hal T Weeks in Treatment: 7 History of Present Illness HPI Description: ADMISSION 12/20/2019 This is an 86 year old woman referred by her primary physician Dr. Felipa Eth for review of a wound on her right lateral lower leg. She was actually in this clinic on 2 separate occasions in 2010 and 2012 cared for by Dr. Sherilyn Cooter. At that point in time she had wounds on her right leg as well. She tells [place] to 1 month ago she noticed a scab building up on her right lateral lower leg this opened into a wound. There was no overt cause of this no trauma, no infection  that she is aware of. She has a history of chronic venous insufficiency and wears compression stockings fairly religiously indeed she is done well over the last 8 years since she was last in this clinic. She has been applying Vaseline on this and a covering phone. This is not progressing towards healing. Past medical history; interstitial lung disease, chronic atrial fibrillation status post pacemaker, osteoarthritis of the left knee, left breast CA, chronic repeat venous insufficiency. She takes Eliquis for her atrial fibrillation stroke prophylaxis. ABI in our clinic was 0.99 on the right 1/15; superficial wound on the right lateral calf in the setting of severe acute skin changes from chronic venous insufficiency and lymphedema. We used Iodoflex last week she complained of a lot of pain 1/22; this is a small but difficult wound on the right lateral calf in the setting of severe chronic venous insufficiency and secondary lymphedema. She continues to state the wounds things hurts when she is up on it but seems to be relieved by putting her leg up. I changed her to [facility] last week because the Iodoflex seem to be causing stinging. 1/29. This wound appears to be contracting somewhat. Changes of chronic venous insufficiency with secondary lymphedema 2/12; not as good as surface today and  slightly bigger. I had to change her from Iodoflex to [facility] because of the stinging pain although she does not think it was any better on the [facility]. She comes into clinic today with a area on the medial left great toe. She said she noticed blood on her sock last Saturday. She had some form of bunion surgery by Dr. Ila Mcgill her podiatrist sometime in 2019 she said he "shave the area". I could not find his operative report although I did see reference to the bunion in the area. 2/19; somewhat improved wound on the right lateral lower leg however the wound she came in on the bunion of her left great toe is actually I think larger still somewhat inflamed. We have been using Iodoflex 2/26; not much improvement in either wound area which is the original venous wound on the right lateral lower leg and in the area on the bunion of her left great toe. This still looks somewhat inflamed and tender. We have been using Iodoflex with open much improvement. Electronic Signature(s) Signed: 02/08/2020 5:26:28 PM By: Linton Ham MD Entered By: Linton Ham on 02/08/2020 10:35:19 -------------------------------------------------------------------------------- Physical Exam Details Patient Name: Date of Service: Lemere, Ozelle J. 02/08/2020 9:15 AM Medical Record ON:[id number] Patient Account Number: [id number] Date of Birth/Sex: Treating RN: Mar 28, 1933 (86 y.o. [name] Primary Care Provider: Patrina Levering Other Clinician: Referring Provider: Treating Provider/Extender:Anallely Rosell, Secundino Ginger, Hal T Weeks in Treatment: 7 Constitutional Patient is hypertensive.. Pulse regular and within target range for patient.Marland Kitchen Respirations regular, non-labored and within target range.. Temperature is normal and within the target range for the patient.Marland Kitchen Appears in no distress. Cardiovascular She has severe venous hypertension.. Musculoskeletal Some tenderness over the left  first MTP joint but I do not think there is infection here.. Notes Wound exam; The area that we have been primary following is the right lateral lower leg. Again there is a lot of debris around the wound edge which I removed with a #3 curette I am not really sure why she is generating this much debris. Hemostasis with direct pressure the wound seems to clean up quite nicely. Also problematic the area on the tip of her varus deformity at the left first  MTP. This again does not look very healthy and require debridement. She does not tolerate this very well. Electronic Signature(s) Signed: 02/08/2020 5:26:28 PM By: Linton Ham MD Entered By: Linton Ham on 02/08/2020 10:36:47 -------------------------------------------------------------------------------- Physician Orders Details Patient Name: Date of Service: [name] 02/08/2020 9:15 AM Medical Record ON:[id number] Patient Account Number: [id number] Date of Birth/Sex: Treating RN: 05-Oct-1933 (86 y.o. [name] Primary Care Provider: Lajean Manes T Other Clinician: Referring Provider: Treating Provider/Extender:Roshell Brigham, Secundino Ginger, Hal T Weeks in Treatment: 7 Verbal / Phone Orders: No Diagnosis Coding ICD-10 Coding Code Description I87.321 Chronic venous hypertension (idiopathic) with inflammation of right lower extremity L97.812 Non-pressure chronic ulcer of other part of right lower leg with fat layer exposed L97.522 Non-pressure chronic ulcer of other part of left foot with fat layer exposed Follow-up Appointments Return Appointment in 1 week. - Friday Dressing Change Frequency Wound #3 Right,Lateral Lower Leg Do not change entire dressing for one week. Wound #4 Left,Medial Foot Change Dressing every other day. - only to left medial foot wound Skin Barriers/Peri-Wound Care Moisturizing lotion TCA Cream or Ointment - mixed with lotion to leg Wound Cleansing May shower with protection. - cast  protector Primary Wound Dressing Wound #3 Right,Lateral Lower Leg Hydrofera Blue Wound #4 Left,Medial Foot Hydrofera Blue Secondary Dressing Wound #3 Right,Lateral Lower Leg Dry Gauze Wound #4 Left,Medial Foot Foam - or tape Dry Gauze Edema Control 3 Layer Compression System - Right Lower Extremity Avoid standing for long periods of time Elevate legs to the level of the heart or above for 30 minutes daily and/or when sitting, a frequency of: - throughout the day. Exercise regularly Off-Loading Open toe surgical shoe to: - needs show for left foot Electronic Signature(s) Signed: 02/08/2020 5:17:01 PM By: Kela Millin Signed: 02/08/2020 5:26:28 PM By: Linton Ham MD Entered By: Kela Millin on 02/08/2020 10:18:37 -------------------------------------------------------------------------------- Problem List Details Patient Name: Date of Service: [name] 02/08/2020 9:15 AM Medical Record ON:[id number] Patient Account Number: [id number] Date of Birth/Sex: Treating RN: 1933-03-28 (86 y.o. [name] Primary Care Provider: Lajean Manes T Other Clinician: Referring Provider: Treating Provider/Extender:Mouhamadou Gittleman, Secundino Ginger, Junius Creamer in Treatment: 7 Active Problems ICD-10 Evaluated Encounter Code Description Active Date Today Diagnosis I87.321 Chronic venous hypertension (idiopathic) with 12/20/2019 No Yes inflammation of right lower extremity L97.812 Non-pressure chronic ulcer of other part of right lower 12/20/2019 No Yes leg with fat layer exposed L97.522 Non-pressure chronic ulcer of other part of left foot 01/25/2020 No Yes with fat layer exposed Inactive Problems Resolved Problems Electronic Signature(s) Signed: 02/08/2020 5:26:28 PM By: Linton Ham MD Entered By: Linton Ham on 02/08/2020 10:34:27 -------------------------------------------------------------------------------- Progress Note Details Patient Name: Date of  Service: [name] 02/08/2020 9:15 AM Medical Record ON:[id number] Patient Account Number: [id number] Date of Birth/Sex: Treating RN: [birth year] (86 y.o. [name] Primary Care Provider: Patrina Levering Other Clinician: Referring Provider: Treating Provider/Extender:Tamanika Heiney, Secundino Ginger, Hal T Weeks in Treatment: 7 Subjective History of Present Illness (HPI) ADMISSION 12/20/2019 This is an 86 year old woman referred by her primary physician Dr. Felipa Eth for review of a wound on her right lateral lower leg. She was actually in this clinic on 2 separate occasions in 2010 and 2012 cared for by Dr. Sherilyn Cooter. At that point in time she had wounds on her right leg as well. She tells [place] to 1 month ago she noticed a scab building up on her right lateral lower leg this opened into a wound. There was no overt cause  of this no trauma, no infection that she is aware of. She has a history of chronic venous insufficiency and wears compression stockings fairly religiously indeed she is done well over the last 8 years since she was last in this clinic. She has been applying Vaseline on this and a covering phone. This is not progressing towards healing. Past medical history; interstitial lung disease, chronic atrial fibrillation status post pacemaker, osteoarthritis of the left knee, left breast CA, chronic repeat venous insufficiency. She takes Eliquis for her atrial fibrillation stroke prophylaxis. ABI in our clinic was 0.99 on the right 1/15; superficial wound on the right lateral calf in the setting of severe acute skin changes from chronic venous insufficiency and lymphedema. We used Iodoflex last week she complained of a lot of pain 1/22; this is a small but difficult wound on the right lateral calf in the setting of severe chronic venous insufficiency and secondary lymphedema. She continues to state the wounds things hurts when she is up on it but seems to be relieved by  putting her leg up. I changed her to [facility] last week because the Iodoflex seem to be causing stinging. 1/29. This wound appears to be contracting somewhat. Changes of chronic venous insufficiency with secondary lymphedema 2/12; not as good as surface today and slightly bigger. I had to change her from Iodoflex to [facility] because of the stinging pain although she does not think it was any better on the [facility]. ooShe comes into clinic today with a area on the medial left great toe. She said she noticed blood on her sock last Saturday. She had some form of bunion surgery by Dr. Ila Mcgill her podiatrist sometime in 2019 she said he "shave the area". I could not find his operative report although I did see reference to the bunion in the area. 2/19; somewhat improved wound on the right lateral lower leg however the wound she came in on the bunion of her left great toe is actually I think larger still somewhat inflamed. We have been using Iodoflex 2/26; not much improvement in either wound area which is the original venous wound on the right lateral lower leg and in the area on the bunion of her left great toe. This still looks somewhat inflamed and tender. We have been using Iodoflex with open much improvement. Objective Constitutional Patient is hypertensive.. Pulse regular and within target range for patient.Marland Kitchen Respirations regular, non-labored and within target range.. Temperature is normal and within the target range for the patient.Marland Kitchen Appears in no distress. Vitals Time Taken: 9:47 AM, Height: 69 in, Weight: 163 lbs, BMI: 24.1, Temperature: 98.0 F, Pulse: 101 bpm, Respiratory Rate: 16 breaths/min, Blood Pressure: 146/89 mmHg. Cardiovascular She has severe venous hypertension.. Musculoskeletal Some tenderness over the left first MTP joint but I do not think there is infection here.. General Notes: Wound exam; ooThe area that we have been primary following is the  right lateral lower leg. Again there is a lot of debris around the wound edge which I removed with a #3 curette I am not really sure why she is generating this much debris. Hemostasis with direct pressure the wound seems to clean up quite nicely. ooAlso problematic the area on the tip of her varus deformity at the left first MTP. This again does not look very healthy and require debridement. She does not tolerate this very well. Integumentary (Hair, Skin) Wound #3 status is Open. Original cause of wound was Gradually Appeared. The wound  is located on the Right,Lateral Lower Leg. The wound measures 1cm length x 0.8cm width x 0.2cm depth; 0.628cm^2 area and 0.126cm^3 volume. There is Fat Layer (Subcutaneous Tissue) Exposed exposed. There is no tunneling or undermining noted. There is a medium amount of serosanguineous drainage noted. The wound margin is flat and intact. There is medium (34-66%) red granulation within the wound bed. There is a medium (34-66%) amount of necrotic tissue within the wound bed including Adherent Slough. Wound #4 status is Open. Original cause of wound was Shear/Friction. The wound is located on the Left,Medial Foot. The wound measures 0.7cm length x 1.2cm width x 0.2cm depth; 0.66cm^2 area and 0.132cm^3 volume. There is Fat Layer (Subcutaneous Tissue) Exposed exposed. There is no tunneling or undermining noted. There is a small amount of serosanguineous drainage noted. The wound margin is flat and intact. There is no granulation within the wound bed. There is a large (67-100%) amount of necrotic tissue within the wound bed including Adherent Slough. Assessment Active Problems ICD-10 Chronic venous hypertension (idiopathic) with inflammation of right lower extremity Non-pressure chronic ulcer of other part of right lower leg with fat layer exposed Non-pressure chronic ulcer of other part of left foot with fat layer exposed Procedures Wound #3 Pre-procedure  diagnosis of Wound #3 is a Venous Leg Ulcer located on the Right,Lateral Lower Leg .Severity of Tissue Pre Debridement is: Fat layer exposed. There was a Excisional Skin/Subcutaneous Tissue Debridement with a total area of 0.8 sq cm performed by Ricard Dillon., MD. With the following instrument(s): Curette to remove Viable and Non-Viable tissue/material. Material removed includes Subcutaneous Tissue and Slough and after achieving pain control using Other (benzocaine, 20%). No specimens were taken. A time out was conducted at 10:15, prior to the start of the procedure. A Minimum amount of bleeding was controlled with Pressure. The procedure was tolerated well with a pain level of 2 throughout and a pain level of 0 following the procedure. Post Debridement Measurements: 1cm length x 0.8cm width x 0.2cm depth; 0.126cm^3 volume. Character of Wound/Ulcer Post Debridement is improved. Severity of Tissue Post Debridement is: Fat layer exposed. Post procedure Diagnosis Wound #3: Same as Pre-Procedure Pre-procedure diagnosis of Wound #3 is a Venous Leg Ulcer located on the Right,Lateral Lower Leg . There was a Three Layer Compression Therapy Procedure by Deon Pilling, RN. Post procedure Diagnosis Wound #3: Same as Pre-Procedure Wound #4 Pre-procedure diagnosis of Wound #4 is a Pressure Ulcer located on the Left,Medial Foot . There was a Excisional Skin/Subcutaneous Tissue Debridement with a total area of 0.84 sq cm performed by Ricard Dillon., MD. With the following instrument(s): Curette to remove Viable and Non-Viable tissue/material. Material removed includes Subcutaneous Tissue and Slough and after achieving pain control using Other (benzocaine, 20%). No specimens were taken. A time out was conducted at 10:15, prior to the start of the procedure. A Minimum amount of bleeding was controlled with Pressure. The procedure was tolerated well with a pain level of 2 throughout and a pain level of 0  following the procedure. Post Debridement Measurements: 0.7cm length x 1.2cm width x 0.2cm depth; 0.132cm^3 volume. Post debridement Stage noted as Category/Stage II. Character of Wound/Ulcer Post Debridement is improved. Post procedure Diagnosis Wound #4: Same as Pre-Procedure Plan Follow-up Appointments: Return Appointment in 1 week. - Friday Dressing Change Frequency: Wound #3 Right,Lateral Lower Leg: Do not change entire dressing for one week. Wound #4 Left,Medial Foot: Change Dressing every other day. - only to left medial foot  wound Skin Barriers/Peri-Wound Care: Moisturizing lotion TCA Cream or Ointment - mixed with lotion to leg Wound Cleansing: May shower with protection. - cast protector Primary Wound Dressing: Wound #3 Right,Lateral Lower Leg: Hydrofera Blue Wound #4 Left,Medial Foot: Hydrofera Blue Secondary Dressing: Wound #3 Right,Lateral Lower Leg: Dry Gauze Wound #4 Left,Medial Foot: Foam - or tape Dry Gauze Edema Control: 3 Layer Compression System - Right Lower Extremity Avoid standing for long periods of time Elevate legs to the level of the heart or above for 30 minutes daily and/or when sitting, a frequency of: - throughout the day. Exercise regularly Off-Loading: Open toe surgical shoe to: - needs show for left foot 1. I change the primary dressing on both of these areas to [facility]. On the right still under compression 2. I may need to send her back to see Dr. Paulla Dolly about this area on the first MTP medial aspect on the left. He has previously dealt with this. It is also much the wound that concerns me or its offloading but the area looks inflamed and angry. I do not believe this is cellulitis Electronic Signature(s) Signed: 02/08/2020 5:26:28 PM By: Linton Ham MD Entered By: Linton Ham on 02/08/2020 10:38:26 -------------------------------------------------------------------------------- SuperBill Details Patient Name: Date of  Service: [name] 02/08/2020 Medical Record ON:[id number] Patient Account Number: [id number] Date of Birth/Sex: Treating RN: February 13, 1933 (86 y.o. [name] Primary Care Provider: Lajean Manes T Other Clinician: Referring Provider: Treating Provider/Extender:Keneth Borg, Secundino Ginger, Hal T Weeks in Treatment: 7 Diagnosis Coding ICD-10 Codes Code Description I87.321 Chronic venous hypertension (idiopathic) with inflammation of right lower extremity L97.812 Non-pressure chronic ulcer of other part of right lower leg with fat layer exposed L97.522 Non-pressure chronic ulcer of other part of left foot with fat layer exposed Facility Procedures The patient participates with Medicare or their insurance follows the Medicare Facility Guidelines: CPT4 Code Description Modifier Quantity [id number] - DEB SUBQ TISSUE 20 SQ CM/< 1 ICD-10 Diagnosis Description [id number] Non-pressure chronic ulcer of  other part of right lower leg with fat layer exposed L97.522 Non-pressure chronic ulcer of other part of left foot with fat layer exposed Physician Procedures CPT4 Code Description: [id number] - WC PHYS SUBQ TISS 20 SQ CM ICD-10 Diagnosis Description L97.812 Non-pressure chronic ulcer of other part of right lower le L97.522 Non-pressure chronic ulcer of other part of left foot with Modifier: g with fat laye fat layer expo Quantity: 1 r exposed sed Electronic Signature(s) Signed: 02/08/2020 5:26:28 PM By: Linton Ham MD Entered By: Linton Ham on 02/08/2020 10:38:41

## 2020-02-14 ENCOUNTER — Encounter (HOSPITAL_BASED_OUTPATIENT_CLINIC_OR_DEPARTMENT_OTHER): Payer: Medicare Other | Attending: Internal Medicine | Admitting: Internal Medicine

## 2020-02-14 ENCOUNTER — Other Ambulatory Visit: Payer: Self-pay

## 2020-02-14 DIAGNOSIS — I87322 Chronic venous hypertension (idiopathic) with inflammation of left lower extremity: Secondary | ICD-10-CM | POA: Diagnosis not present

## 2020-02-14 DIAGNOSIS — Z853 Personal history of malignant neoplasm of breast: Secondary | ICD-10-CM | POA: Insufficient documentation

## 2020-02-14 DIAGNOSIS — Z950 Presence of cardiac pacemaker: Secondary | ICD-10-CM | POA: Insufficient documentation

## 2020-02-14 DIAGNOSIS — I110 Hypertensive heart disease with heart failure: Secondary | ICD-10-CM | POA: Diagnosis not present

## 2020-02-14 DIAGNOSIS — L97812 Non-pressure chronic ulcer of other part of right lower leg with fat layer exposed: Secondary | ICD-10-CM | POA: Diagnosis present

## 2020-02-14 DIAGNOSIS — L89892 Pressure ulcer of other site, stage 2: Secondary | ICD-10-CM | POA: Insufficient documentation

## 2020-02-14 DIAGNOSIS — L97522 Non-pressure chronic ulcer of other part of left foot with fat layer exposed: Secondary | ICD-10-CM | POA: Insufficient documentation

## 2020-02-14 DIAGNOSIS — Z7901 Long term (current) use of anticoagulants: Secondary | ICD-10-CM | POA: Insufficient documentation

## 2020-02-14 DIAGNOSIS — I87321 Chronic venous hypertension (idiopathic) with inflammation of right lower extremity: Secondary | ICD-10-CM | POA: Diagnosis not present

## 2020-02-14 DIAGNOSIS — M1712 Unilateral primary osteoarthritis, left knee: Secondary | ICD-10-CM | POA: Insufficient documentation

## 2020-02-14 DIAGNOSIS — J849 Interstitial pulmonary disease, unspecified: Secondary | ICD-10-CM | POA: Insufficient documentation

## 2020-02-14 DIAGNOSIS — I509 Heart failure, unspecified: Secondary | ICD-10-CM | POA: Diagnosis not present

## 2020-02-14 DIAGNOSIS — I482 Chronic atrial fibrillation, unspecified: Secondary | ICD-10-CM | POA: Diagnosis not present

## 2020-02-14 NOTE — Progress Notes (Signed)
[name], [name] ([id number]) Visit Report for 02/14/2020 HPI Details Patient Name: Date of Service: [name], [name] 02/14/2020 9:00 AM Medical Record [id number] Patient Account Number: [id number] Date of Birth/Sex: Treating RN: 11/05/1933 (86 y.o. [name] Primary Care Provider: Patrina Levering Other Clinician: Referring Provider: Treating Provider/Extender:Vishwa Dais, Secundino Ginger, Hal T Weeks in Treatment: 8 History of Present Illness HPI Description: ADMISSION 12/20/2019 This is an 86 year old woman referred by her primary physician Dr. Felipa Eth for review of a wound on her right lateral lower leg. She was actually in this clinic on 2 separate occasions in 2010 and 2012 cared for by Dr. Sherilyn Cooter. At that point in time she had wounds on her right leg as well. She tells [place] to 1 month ago she noticed a scab building up on her right lateral lower leg this opened into a wound. There was no overt cause of this no trauma, no infection that she is aware of. She has a history of chronic venous insufficiency and wears compression stockings fairly religiously indeed she is done well over the last 8 years since she was last in this clinic. She has been applying Vaseline on this and a covering phone. This is not progressing towards healing. Past medical history; interstitial lung disease, chronic atrial fibrillation status post pacemaker, osteoarthritis of the left knee, left breast CA, chronic repeat venous insufficiency. She takes Eliquis for her atrial fibrillation stroke prophylaxis. ABI in our clinic was 0.99 on the right 1/15; superficial wound on the right lateral calf in the setting of severe acute skin changes from chronic venous insufficiency and lymphedema. We used Iodoflex last week she complained of a lot of pain 1/22; this is a small but difficult wound on the right lateral calf in the setting of severe chronic venous insufficiency and secondary lymphedema. She continues  to state the wounds things hurts when she is up on it but seems to be relieved by putting her leg up. I changed her to [facility] last week because the Iodoflex seem to be causing stinging. 1/29. This wound appears to be contracting somewhat. Changes of chronic venous insufficiency with secondary lymphedema 2/12; not as good as surface today and slightly bigger. I had to change her from Iodoflex to [facility] because of the stinging pain although she does not think it was any better on the [facility]. She comes into clinic today with a area on the medial left great toe. She said she noticed blood on her sock last Saturday. She had some form of bunion surgery by Dr. Ila Mcgill her podiatrist sometime in 2019 she said he "shave the area". I could not find his operative report although I did see reference to the bunion in the area. 2/19; somewhat improved wound on the right lateral lower leg however the wound she came in on the bunion of her left great toe is actually I think larger still somewhat inflamed. We have been using Iodoflex 2/26; not much improvement in either wound area which is the original venous wound on the right lateral lower leg and in the area on the bunion of her left great toe. This still looks somewhat inflamed and tender. We have been using Iodoflex with open much improvement. 3/4; in general both her wounds look better this includes the original venous insufficiency wound on the right lateral lower leg and the area on her tip of her bunion on the left great toe medial aspect of the MTP. Change to [facility] last  week Electronic Signature(s) Signed: 02/14/2020 5:41:03 PM By: Linton Ham MD Entered By: Linton Ham on 02/14/2020 09:56:46 -------------------------------------------------------------------------------- Physical Exam Details Patient Name: Date of Service: [name] 02/14/2020 9:00 AM Medical Record [id number] Patient Account  Number: [id number] Date of Birth/Sex: Treating RN: 1933/03/01 (86 y.o. [name] Primary Care Provider: Patrina Levering Other Clinician: Referring Provider: Treating Provider/Extender:Omarie Parcell, Secundino Ginger, Hal T Weeks in Treatment: 8 Constitutional Sitting or standing Blood Pressure is within target range for patient.. Pulse regular and within target range for patient.Marland Kitchen Respirations regular, non-labored and within target range.. Temperature is normal and within the target range for the patient.Marland Kitchen Appears in no distress. Cardiovascular And has evidence of severe venous hypertension bilaterally with a multitude of small dilated veins into her dorsal feet.. Notes Wound exam; the area that we have been primary following is the right lateral lower leg. This seems to be much better this week there is less debris no debridement was required I did wash this out with Anasept and gauze surface of the wound looks better The area over her first left medial MTP also looks better and there is much less inflammation Electronic Signature(s) Signed: 02/14/2020 5:41:03 PM By: Linton Ham MD Entered By: Linton Ham on 02/14/2020 09:59:02 -------------------------------------------------------------------------------- Physician Orders Details Patient Name: Date of Service: [name] 02/14/2020 9:00 AM Medical Record [id number] Patient Account Number: [id number] Date of Birth/Sex: Treating RN: 03-12-1933 (86 y.o. [name] Primary Care Provider: Lajean Manes T Other Clinician: Referring Provider: Treating Provider/Extender:Hobson Lax, Secundino Ginger, Hal T Weeks in Treatment: 8 Verbal / Phone Orders: No Diagnosis Coding ICD-10 Coding Code Description I87.321 Chronic venous hypertension (idiopathic) with inflammation of right lower extremity L97.812 Non-pressure chronic ulcer of other part of right lower leg with fat layer exposed L97.522 Non-pressure chronic  ulcer of other part of left foot with fat layer exposed Follow-up Appointments Return Appointment in 1 week. - Friday Dressing Change Frequency Wound #3 Right,Lateral Lower Leg Do not change entire dressing for one week. Wound #4 Left,Medial Foot Change Dressing every other day. - only to left medial foot wound Skin Barriers/Peri-Wound Care Moisturizing lotion TCA Cream or Ointment - mixed with lotion to leg Wound Cleansing May shower with protection. - cast protector Primary Wound Dressing Wound #3 Right,Lateral Lower Leg Hydrofera Blue Wound #4 Left,Medial Foot Hydrofera Blue Secondary Dressing Wound #3 Right,Lateral Lower Leg Dry Gauze Wound #4 Left,Medial Foot Foam - or tape Dry Gauze Edema Control 3 Layer Compression System - Right Lower Extremity Avoid standing for long periods of time Elevate legs to the level of the heart or above for 30 minutes daily and/or when sitting, a frequency of: - throughout the day. Exercise regularly Support Garment 30-40 mm/Hg pressure to: - order patient a farrow wrap 4000 for bilateral legs. Off-Loading Open toe surgical shoe to: - needs show for left foot Electronic Signature(s) Signed: 02/14/2020 5:41:03 PM By: Linton Ham MD Signed: 02/14/2020 6:05:23 PM By: Deon Pilling Entered By: Deon Pilling on 02/14/2020 09:50:29 -------------------------------------------------------------------------------- Problem List Details Patient Name: Date of Service: [name]. 02/14/2020 9:00 AM Medical Record [id number] Patient Account Number: [id number] Date of Birth/Sex: Treating RN: 26-Jul-1933 (86 y.o. [name] Primary Care Provider: Other Clinician: Patrina Levering Referring Provider: Treating Provider/Extender:Jakel Alphin, Secundino Ginger, Junius Creamer in Treatment: 8 Active Problems ICD-10 Evaluated Encounter Code Description Active Date Today Diagnosis I87.321 Chronic venous hypertension (idiopathic) with 12/20/2019  No Yes inflammation of right lower extremity L97.812 Non-pressure chronic ulcer of  other part of right lower 12/20/2019 No Yes leg with fat layer exposed I87.322 Chronic venous hypertension (idiopathic) with 02/14/2020 No Yes inflammation of left lower extremity L97.522 Non-pressure chronic ulcer of other part of left foot 01/25/2020 No Yes with fat layer exposed Inactive Problems Resolved Problems Electronic Signature(s) Signed: 02/14/2020 5:41:03 PM By: Linton Ham MD Entered By: Linton Ham on 02/14/2020 10:00:11 -------------------------------------------------------------------------------- Progress Note Details Patient Name: Date of Service: [name] 02/14/2020 9:00 AM Medical Record [id number] Patient Account Number: [id number] Date of Birth/Sex: Treating RN: 1932-12-24 (86 y.o. [name] Primary Care Provider: Patrina Levering Other Clinician: Referring Provider: Treating Provider/Extender:Alle Difabio, Secundino Ginger, Hal T Weeks in Treatment: 8 Subjective History of Present Illness (HPI) ADMISSION 12/20/2019 This is an 86 year old woman referred by her primary physician Dr. Felipa Eth for review of a wound on her right lateral lower leg. She was actually in this clinic on 2 separate occasions in 2010 and 2012 cared for by Dr. Sherilyn Cooter. At that point in time she had wounds on her right leg as well. She tells [place] to 1 month ago she noticed a scab building up on her right lateral lower leg this opened into a wound. There was no overt cause of this no trauma, no infection that she is aware of. She has a history of chronic venous insufficiency and wears compression stockings fairly religiously indeed she is done well over the last 8 years since she was last in this clinic. She has been applying Vaseline on this and a covering phone. This is not progressing towards healing. Past medical history; interstitial lung disease, chronic atrial fibrillation status post  pacemaker, osteoarthritis of the left knee, left breast CA, chronic repeat venous insufficiency. She takes Eliquis for her atrial fibrillation stroke prophylaxis. ABI in our clinic was 0.99 on the right 1/15; superficial wound on the right lateral calf in the setting of severe acute skin changes from chronic venous insufficiency and lymphedema. We used Iodoflex last week she complained of a lot of pain 1/22; this is a small but difficult wound on the right lateral calf in the setting of severe chronic venous insufficiency and secondary lymphedema. She continues to state the wounds things hurts when she is up on it but seems to be relieved by putting her leg up. I changed her to [facility] last week because the Iodoflex seem to be causing stinging. 1/29. This wound appears to be contracting somewhat. Changes of chronic venous insufficiency with secondary lymphedema 2/12; not as good as surface today and slightly bigger. I had to change her from Iodoflex to [facility] because of the stinging pain although she does not think it was any better on the [facility]. ooShe comes into clinic today with a area on the medial left great toe. She said she noticed blood on her sock last Saturday. She had some form of bunion surgery by Dr. Ila Mcgill her podiatrist sometime in 2019 she said he "shave the area". I could not find his operative report although I did see reference to the bunion in the area. 2/19; somewhat improved wound on the right lateral lower leg however the wound she came in on the bunion of her left great toe is actually I think larger still somewhat inflamed. We have been using Iodoflex 2/26; not much improvement in either wound area which is the original venous wound on the right lateral lower leg and in the area on the bunion of her left great toe. This still  looks somewhat inflamed and tender. We have been using Iodoflex with open much improvement. 3/4; in general both her  wounds look better this includes the original venous insufficiency wound on the right lateral lower leg and the area on her tip of her bunion on the left great toe medial aspect of the MTP. Change to Hydrofera Blue last week Objective Constitutional Sitting or standing Blood Pressure is within target range for patient.. Pulse regular and within target range for patient.Marland Kitchen Respirations regular, non-labored and within target range.. Temperature is normal and within the target range for the patient.Marland Kitchen Appears in no distress. Vitals Time Taken: 9:08 AM, Height: 69 in, Weight: 163 lbs, BMI: 24.1, Temperature: 98.0 F, Pulse: 47 bpm, Respiratory Rate: 16 breaths/min, Blood Pressure: 112/64 mmHg. Cardiovascular And has evidence of severe venous hypertension bilaterally with a multitude of small dilated veins into her dorsal feet.. General Notes: Wound exam; the area that we have been primary following is the right lateral lower leg. This seems to be much better this week there is less debris no debridement was required I did wash this out with Anasept and gauze surface of the wound looks better ooThe area over her first left medial MTP also looks better and there is much less inflammation Integumentary (Hair, Skin) Wound #3 status is Open. Original cause of wound was Gradually Appeared. The wound is located on the Right,Lateral Lower Leg. The wound measures 1cm length x 0.6cm width x 0.2cm depth; 0.471cm^2 area and 0.094cm^3 volume. There is Fat Layer (Subcutaneous Tissue) Exposed exposed. There is no tunneling or undermining noted. There is a medium amount of serous drainage noted. The wound margin is flat and intact. There is medium (34-66%) red granulation within the wound bed. There is a medium (34-66%) amount of necrotic tissue within the wound bed including Adherent Slough. Wound #4 status is Open. Original cause of wound was Shear/Friction. The wound is located on the Left,Medial Foot. The  wound measures 0.4cm length x 0.5cm width x 0.1cm depth; 0.157cm^2 area and 0.016cm^3 volume. There is Fat Layer (Subcutaneous Tissue) Exposed exposed. There is no tunneling or undermining noted. There is a small amount of serous drainage noted. The wound margin is flat and intact. There is medium (34-66%) pink, pale granulation within the wound bed. There is a medium (34-66%) amount of necrotic tissue within the wound bed including Adherent Slough. Assessment Active Problems ICD-10 Chronic venous hypertension (idiopathic) with inflammation of right lower extremity Non-pressure chronic ulcer of other part of right lower leg with fat layer exposed Chronic venous hypertension (idiopathic) with inflammation of left lower extremity Non-pressure chronic ulcer of other part of left foot with fat layer exposed Procedures Wound #3 Pre-procedure diagnosis of Wound #3 is a Venous Leg Ulcer located on the Right,Lateral Lower Leg . There was a Three Layer Compression Therapy Procedure with a pre-treatment ABI of 1 by [name], RN. Post procedure Diagnosis Wound #3: Same as Pre-Procedure Plan Follow-up Appointments: Return Appointment in 1 week. - Friday Dressing Change Frequency: Wound #3 Right,Lateral Lower Leg: Do not change entire dressing for one week. Wound #4 Left,Medial Foot: Change Dressing every other day. - only to left medial foot wound Skin Barriers/Peri-Wound Care: Moisturizing lotion TCA Cream or Ointment - mixed with lotion to leg Wound Cleansing: May shower with protection. - cast protector Primary Wound Dressing: Wound #3 Right,Lateral Lower Leg: Hydrofera Blue Wound #4 Left,Medial Foot: Hydrofera Blue Secondary Dressing: Wound #3 Right,Lateral Lower Leg: Dry Gauze Wound #4 Left,Medial Foot:  Foam - or tape Dry Gauze Edema Control: 3 Layer Compression System - Right Lower Extremity Avoid standing for long periods of time Elevate legs to the level of the heart or  above for 30 minutes daily and/or when sitting, a frequency of: - throughout the day. Exercise regularly Support Garment 30-40 mm/Hg pressure to: - order patient a farrow wrap 4000 for bilateral legs. Off-Loading: Open toe surgical shoe to: - needs show for left foot 1. Hydrofera Blue continues to both wound areas 2. I talked about stocking use going forward with the patient today. Electronic Signature(s) Signed: 02/14/2020 5:41:03 PM By: Linton Ham MD Entered By: Linton Ham on 02/14/2020 10:01:02 -------------------------------------------------------------------------------- SuperBill Details Patient Name: Date of Service: [name] 02/14/2020 Medical Record [id number] Patient Account Number: [id number] Date of Birth/Sex: Treating RN: November 15, 1933 (86 y.o. [name] Primary Care Provider: Lajean Manes T Other Clinician: Referring Provider: Treating Provider/Extender:Keller Bounds, Secundino Ginger, Hal T Weeks in Treatment: 8 Diagnosis Coding ICD-10 Codes Code Description I87.321 Chronic venous hypertension (idiopathic) with inflammation of right lower extremity L97.812 Non-pressure chronic ulcer of other part of right lower leg with fat layer exposed L97.522 Non-pressure chronic ulcer of other part of left foot with fat layer exposed Facility Procedures Physician Procedures CPT4 Code Description: [id number] - WC PHYS LEVEL 3 - EST PT ICD-10 Diagnosis Description I87.321 Chronic venous hypertension (idiopathic) with inflammati extremity L97.812 Non-pressure chronic ulcer of other part of right lower L97.522 Non-pressure  chronic ulcer of other part of left foot wi Modifier: on of right lowe leg with fat lay th fat layer exp Quantity: 1 r er exposed osed Electronic Signature(s) Signed: 02/14/2020 5:41:03 PM By: Linton Ham MD Entered By: Linton Ham on 02/14/2020 10:01:19

## 2020-02-22 ENCOUNTER — Other Ambulatory Visit: Payer: Self-pay

## 2020-02-22 ENCOUNTER — Encounter (HOSPITAL_BASED_OUTPATIENT_CLINIC_OR_DEPARTMENT_OTHER): Payer: Medicare Other | Admitting: Internal Medicine

## 2020-02-22 DIAGNOSIS — I87321 Chronic venous hypertension (idiopathic) with inflammation of right lower extremity: Secondary | ICD-10-CM | POA: Diagnosis not present

## 2020-02-25 NOTE — Progress Notes (Signed)
[name], [name] ([id number]) Visit Report for 02/22/2020 Debridement Details Patient Name: Date of Service: [name] 02/22/2020 9:15 AM Medical Record [id number] Patient Account Number: [id number] Date of Birth/Sex: Treating RN: 1933/05/18 (86 y.o. [name] Primary Care Provider: Lajean Manes T Other Clinician: Referring Provider: Treating Provider/Extender:[name], [name], [name] in Treatment: 9 Debridement Performed for Wound #4 Left,Medial Foot Assessment: Performed By: Physician [name]., Green Debridement Type: Debridement Level of Consciousness (Pre- Awake and Alert procedure): Pre-procedure Yes - 09:45 Verification/Time Out Taken: Start Time: 09:45 Pain Control: Other : benzocaine, 20% Total Area Debrided (L x W): 0.3 (cm) x 0.5 (cm) = 0.15 (cm) Tissue and other material Viable, Non-Viable, Subcutaneous debrided: Level: Skin/Subcutaneous Tissue Debridement Description: Excisional Instrument: Curette Bleeding: Minimum Hemostasis Achieved: Pressure End Time: 09:46 Procedural Pain: 0 Post Procedural Pain: 0 Response to Treatment: Procedure was tolerated well Level of Consciousness Awake and Alert (Post-procedure): Post Debridement Measurements of Total Wound Length: (cm) 0.3 Stage: Category/Stage II Width: (cm) 0.5 Depth: (cm) 0.1 Volume: (cm) 0.012 Character of Wound/Ulcer Post Improved Debridement: Post Procedure Diagnosis Same as Pre-procedure Electronic Signature(s) Signed: 02/22/2020 5:31:36 PM By: [name] Signed: 02/25/2020 8:43:01 AM By: [name] Entered By: [name] on 02/22/2020 09:48:28 -------------------------------------------------------------------------------- Debridement Details Patient Name: Date of Service: [name] 02/22/2020 9:15 AM Medical Record [id number] Patient Account Number: [id number] Date of Birth/Sex: Treating RN: [birth year] (86 y.o.  [name] Primary Care Provider: Lajean Manes T Other Clinician: Referring Provider: Treating Provider/Extender:[name], [name], [name] in Treatment: 9 Debridement Performed for Wound #3 Right,Lateral Lower Leg Assessment: Performed By: Physician [name]., Green Debridement Type: Debridement Severity of Tissue Pre Fat layer exposed Debridement: Level of Consciousness (Pre- Awake and Alert procedure): Pre-procedure Yes - 09:45 Verification/Time Out Taken: Start Time: 09:45 Pain Control: Other : benzocaine, 20% Total Area Debrided (L x W): 1 (cm) x 0.8 (cm) = 0.8 (cm) Tissue and other material Viable, Non-Viable, Slough, Subcutaneous, Slough debrided: Level: Skin/Subcutaneous Tissue Debridement Description: Excisional Instrument: Curette Bleeding: Minimum Hemostasis Achieved: Pressure End Time: 09:46 Procedural Pain: 0 Post Procedural Pain: 0 Response to Treatment: Procedure was tolerated well Level of Consciousness Awake and Alert (Post-procedure): Post Debridement Measurements of Total Wound Length: (cm) 1 Width: (cm) 0.8 Depth: (cm) 0.1 Volume: (cm) 0.063 Character of Wound/Ulcer Post Improved Debridement: Severity of Tissue Post Debridement: Fat layer exposed Post Procedure Diagnosis Same as Pre-procedure Electronic Signature(s) Signed: 02/22/2020 5:31:36 PM By: [name] Signed: 02/25/2020 8:43:01 AM By: [name] Entered By: Tracy Ham on 02/22/2020 11:10:03 -------------------------------------------------------------------------------- HPI Details Patient Name: Date of Service: [name] 02/22/2020 9:15 AM Medical Record ON:[id number] Patient Account Number: [id number] Date of Birth/Sex: Treating RN: [birth year] (86 y.o. [name] Primary Care Provider: Patrina Green Other Clinician: Referring Provider: Treating Provider/Extender:[name], [name], [name] in  Treatment: 9 History of Present Illness HPI Description: ADMISSION 12/20/2019 This is an 86 year old woman referred by her primary physician Dr. Felipa Green for review of a wound on her right lateral lower leg. She was actually in this clinic on 2 separate occasions in 2010 and 2012 cared for by Dr. Sherilyn Green. At that point in time she had wounds on her right leg as well. She tells [place] to 1 month ago she noticed a scab building up on her right lateral lower leg this opened into a wound. There was no overt cause of this no trauma, no infection that she is aware  of. She has a history of chronic venous insufficiency and wears compression stockings fairly religiously indeed she is done well over the last 8 years since she was last in this clinic. She has been applying Vaseline on this and a covering phone. This is not progressing towards healing. Past medical history; interstitial lung disease, chronic atrial fibrillation status post pacemaker, osteoarthritis of the left knee, left breast CA, chronic repeat venous insufficiency. She takes Eliquis for her atrial fibrillation stroke prophylaxis. ABI in our clinic was 0.99 on the right 1/15; superficial wound on the right lateral calf in the setting of severe acute skin changes from chronic venous insufficiency and lymphedema. We used Iodoflex last week she complained of a lot of pain 1/22; this is a small but difficult wound on the right lateral calf in the setting of severe chronic venous insufficiency and secondary lymphedema. She continues to state the wounds things hurts when she is up on it but seems to be relieved by putting her leg up. I changed her to [facility] last week because the Iodoflex seem to be causing stinging. 1/29. This wound appears to be contracting somewhat. Changes of chronic venous insufficiency with secondary lymphedema 2/12; not as good as surface today and slightly bigger. I had to change her from Iodoflex to [facility]  because of the stinging pain although she does not think it was any better on the [facility]. She comes into clinic today with a area on the medial left great toe. She said she noticed blood on her sock last Saturday. She had some form of bunion surgery by [name] her podiatrist sometime in 2019 she said he "shave the area". I could not find his operative report although I did see reference to the bunion in the area. 2/19; somewhat improved wound on the right lateral lower leg however the wound she came in on the bunion of her left great toe is actually I think larger still somewhat inflamed. We have been using Iodoflex 2/26; not much improvement in either wound area which is the original venous wound on the right lateral lower leg and in the area on the bunion of her left great toe. This still looks somewhat inflamed and tender. We have been using Iodoflex with open much improvement. 3/4; in general both her wounds look better this includes the original venous insufficiency wound on the right lateral lower leg and the area on her tip of her bunion on the left great toe medial aspect of the MTP. Change to Hydrofera Blue last week 3/12; the patient still has a small geographic shaped wound on the right lateral lower leg. We have been using Hydrofera Blue but I changed her to endoform today. She also has the area in the tip of the bunion of the left great toe. We will try [facility] here as well Electronic Signature(s) Signed: 02/25/2020 8:43:01 AM By: [name] Entered By: Tracy Ham on 02/22/2020 11:10:40 -------------------------------------------------------------------------------- Physical Exam Details Patient Name: Date of Service: [name], Tracy J. 02/22/2020 9:15 AM Medical Record ON:[id number] Patient Account Number: [id number] Date of Birth/Sex: Treating RN: 1933/01/11 (86 y.o. [name] Primary Care Provider: Patrina Green Other  Clinician: Referring Provider: Treating Provider/Extender:Kimberly Coye, [name], [name] in Treatment: 9 Constitutional Sitting or standing Blood Pressure is within target range for patient.. Pulse regular and within target range for patient.Marland Kitchen Respirations regular, non-labored and within target range.. Temperature is normal and within the target range for the  patient.Marland Kitchen Appears in no distress. Notes Wound exam; the area that we have been primarily following is the right lateral lower leg. This is geographic in shape in the middle of the chronic venous and lymphedema changes. It has 0.3 in depth and some debris on the surface which I debrided with a #3 curette The area on the tip of the bunion on her first MTP on the left also requires some debridement. Hemostasis in both areas with direct pressure Electronic Signature(s) Signed: 02/25/2020 8:43:01 AM By: [name] Entered By: Tracy Ham on 02/22/2020 11:12:49 -------------------------------------------------------------------------------- Physician Orders Details Patient Name: Date of Service: [name] 02/22/2020 9:15 AM Medical Record ON:[id number] Patient Account Number: [id number] Date of Birth/Sex: Treating RN: 01-20-1933 (86 y.o. [name] Primary Care Provider: Lajean Manes T Other Clinician: Referring Provider: Treating Provider/Extender:Suhaan Perleberg, [name], [name] in Treatment: 9 Verbal / Phone Orders: No Diagnosis Coding ICD-10 Coding Code Description I87.321 Chronic venous hypertension (idiopathic) with inflammation of right lower extremity L97.812 Non-pressure chronic ulcer of other part of right lower leg with fat layer exposed I87.322 Chronic venous hypertension (idiopathic) with inflammation of left lower extremity L97.522 Non-pressure chronic ulcer of other part of left foot with fat layer exposed Follow-up Appointments Return Appointment in 1 week. -  Friday Dressing Change Frequency Wound #3 Right,Lateral Lower Leg Do not change entire dressing for one week. Wound #4 Left,Medial Foot Change Dressing every other day. - only to left medial foot wound Skin Barriers/Peri-Wound Care Moisturizing lotion TCA Cream or Ointment - mixed with lotion to leg Wound Cleansing May shower with protection. - cast protector Primary Wound Dressing Wound #3 Right,Lateral Lower Leg Endoform - moisten with saline Wound #4 Left,Medial Foot Endoform - moisten with saline Secondary Dressing Wound #3 Right,Lateral Lower Leg Dry Gauze Wound #4 Left,Medial Foot Foam - or tape Dry Gauze Edema Control 3 Layer Compression System - Right Lower Extremity Avoid standing for long periods of time Elevate legs to the level of the heart or above for 30 minutes daily and/or when sitting, a frequency of: - throughout the day. Exercise regularly Support Garment 30-40 mm/Hg pressure to: - farrow wrap 4000 for to left leg Off-Loading Open toe surgical shoe to: - left Electronic Signature(s) Signed: 02/22/2020 5:31:36 PM By: [name] Signed: 02/25/2020 8:43:01 AM By: [name] Entered By: [name] on 02/22/2020 09:49:48 -------------------------------------------------------------------------------- Problem List Details Patient Name: Date of Service: [name] 02/22/2020 9:15 AM Medical Record ON:[id number] Patient Account Number: [id number] Date of Birth/Sex: Treating RN: 15-Sep-1933 (86 y.o. [name] Primary Care Provider: Other Clinician: Lajean Manes T Referring Provider: Treating Provider/Extender:Nallely Yost, [name], Junius Creamer in Treatment: 9 Active Problems ICD-10 Evaluated Encounter Code Description Active Date Today Diagnosis I87.321 Chronic venous hypertension (idiopathic) with 12/20/2019 No Yes inflammation of right lower extremity L97.812 Non-pressure chronic ulcer of other part of right  lower 12/20/2019 No Yes leg with fat layer exposed I87.322 Chronic venous hypertension (idiopathic) with 02/14/2020 No Yes inflammation of left lower extremity L97.522 Non-pressure chronic ulcer of other part of left foot 01/25/2020 No Yes with fat layer exposed Inactive Problems Resolved Problems Electronic Signature(s) Signed: 02/25/2020 8:43:01 AM By: [name] Entered By: Tracy Ham on 02/22/2020 11:09:36 -------------------------------------------------------------------------------- Progress Note Details Patient Name: Date of Service: [name] 02/22/2020 9:15 AM Medical Record ON:[id number] Patient Account Number: [id number] Date of Birth/Sex: Treating RN: [birth year] (86 y.o. [name] Primary Care Provider: Patrina Green Other Clinician: Referring  Provider: Treating Provider/Extender:Marcee Jacobs, [name], [name] in Treatment: 9 Subjective History of Present Illness (HPI) ADMISSION 12/20/2019 This is an 86 year old woman referred by her primary physician Dr. Felipa Green for review of a wound on her right lateral lower leg. She was actually in this clinic on 2 separate occasions in 2010 and 2012 cared for by Dr. Sherilyn Green. At that point in time she had wounds on her right leg as well. She tells [place] to 1 month ago she noticed a scab building up on her right lateral lower leg this opened into a wound. There was no overt cause of this no trauma, no infection that she is aware of. She has a history of chronic venous insufficiency and wears compression stockings fairly religiously indeed she is done well over the last 8 years since she was last in this clinic. She has been applying Vaseline on this and a covering phone. This is not progressing towards healing. Past medical history; interstitial lung disease, chronic atrial fibrillation status post pacemaker, osteoarthritis of the left knee, left breast CA, chronic repeat venous insufficiency. She  takes Eliquis for her atrial fibrillation stroke prophylaxis. ABI in our clinic was 0.99 on the right 1/15; superficial wound on the right lateral calf in the setting of severe acute skin changes from chronic venous insufficiency and lymphedema. We used Iodoflex last week she complained of a lot of pain 1/22; this is a small but difficult wound on the right lateral calf in the setting of severe chronic venous insufficiency and secondary lymphedema. She continues to state the wounds things hurts when she is up on it but seems to be relieved by putting her leg up. I changed her to [facility] last week because the Iodoflex seem to be causing stinging. 1/29. This wound appears to be contracting somewhat. Changes of chronic venous insufficiency with secondary lymphedema 2/12; not as good as surface today and slightly bigger. I had to change her from Iodoflex to [facility] because of the stinging pain although she does not think it was any better on the [facility]. ooShe comes into clinic today with a area on the medial left great toe. She said she noticed blood on her sock last Saturday. She had some form of bunion surgery by [name] her podiatrist sometime in 2019 she said he "shave the area". I could not find his operative report although I did see reference to the bunion in the area. 2/19; somewhat improved wound on the right lateral lower leg however the wound she came in on the bunion of her left great toe is actually I think larger still somewhat inflamed. We have been using Iodoflex 2/26; not much improvement in either wound area which is the original venous wound on the right lateral lower leg and in the area on the bunion of her left great toe. This still looks somewhat inflamed and tender. We have been using Iodoflex with open much improvement. 3/4; in general both her wounds look better this includes the original venous insufficiency wound on the right lateral lower  leg and the area on her tip of her bunion on the left great toe medial aspect of the MTP. Change to Hydrofera Blue last week 3/12; the patient still has a small geographic shaped wound on the right lateral lower leg. We have been using Hydrofera Blue but I changed her to endoform today. She also has the area in the tip of the bunion of the left great toe. We  will try Hydrofera Blue here as well Objective Constitutional Sitting or standing Blood Pressure is within target range for patient.. Pulse regular and within target range for patient.Marland Kitchen Respirations regular, non-labored and within target range.. Temperature is normal and within the target range for the patient.Marland Kitchen Appears in no distress. Vitals Time Taken: 9:12 AM, Height: 69 in, Source: Stated, Weight: 163 lbs, Source: Stated, BMI: 24.1, Temperature: 97.7 F, Pulse: 82 bpm, Respiratory Rate: 18 breaths/min, Blood Pressure: 138/69 mmHg. General Notes: Wound exam; the area that we have been primarily following is the right lateral lower leg. This is geographic in shape in the middle of the chronic venous and lymphedema changes. It has 0.3 in depth and some debris on the surface which I debrided with a #3 curette ooThe area on the tip of the bunion on her first MTP on the left also requires some debridement. Hemostasis in both areas with direct pressure Integumentary (Hair, Skin) Wound #3 status is Open. Original cause of wound was Gradually Appeared. The wound is located on the Right,Lateral Lower Leg. The wound measures 1cm length x 0.8cm width x 0.1cm depth; 0.628cm^2 area and 0.063cm^3 volume. There is Fat Layer (Subcutaneous Tissue) Exposed exposed. There is no tunneling or undermining noted. There is a small amount of serous drainage noted. The wound margin is flat and intact. There is medium (34-66%) red granulation within the wound bed. There is a medium (34-66%) amount of necrotic tissue within the wound bed including Adherent  Slough. Wound #4 status is Open. Original cause of wound was Shear/Friction. The wound is located on the Left,Medial Foot. The wound measures 0.3cm length x 0.5cm width x 0.1cm depth; 0.118cm^2 area and 0.012cm^3 volume. There is Fat Layer (Subcutaneous Tissue) Exposed exposed. There is no tunneling or undermining noted. There is a small amount of serous drainage noted. The wound margin is flat and intact. There is no granulation within the wound bed. There is a large (67-100%) amount of necrotic tissue within the wound bed including Adherent Slough. Assessment Active Problems ICD-10 Chronic venous hypertension (idiopathic) with inflammation of right lower extremity Non-pressure chronic ulcer of other part of right lower leg with fat layer exposed Chronic venous hypertension (idiopathic) with inflammation of left lower extremity Non-pressure chronic ulcer of other part of left foot with fat layer exposed Procedures Wound #3 Pre-procedure diagnosis of Wound #3 is a Venous Leg Ulcer located on the Right,Lateral Lower Leg .Severity of Tissue Pre Debridement is: Fat layer exposed. There was a Excisional Skin/Subcutaneous Tissue Debridement with a total area of 0.8 sq cm performed by [name]., Green. With the following instrument(s): Curette to remove Viable and Non-Viable tissue/material. Material removed includes Subcutaneous Tissue and Slough and after achieving pain control using Other (benzocaine, 20%). No specimens were taken. A time out was conducted at 09:45, prior to the start of the procedure. A Minimum amount of bleeding was controlled with Pressure. The procedure was tolerated well with a pain level of 0 throughout and a pain level of 0 following the procedure. Post Debridement Measurements: 1cm length x 0.8cm width x 0.1cm depth; 0.063cm^3 volume. Character of Wound/Ulcer Post Debridement is improved. Severity of Tissue Post Debridement is: Fat layer exposed. Post procedure  Diagnosis Wound #3: Same as Pre-Procedure Pre-procedure diagnosis of Wound #3 is a Venous Leg Ulcer located on the Right,Lateral Lower Leg . There was a Three Layer Compression Therapy Procedure by Deon Pilling, RN. Post procedure Diagnosis Wound #3: Same as Pre-Procedure Wound #4 Pre-procedure diagnosis  of Wound #4 is a Pressure Ulcer located on the Left,Medial Foot . There was a Excisional Skin/Subcutaneous Tissue Debridement with a total area of 0.15 sq cm performed by [name]., Green. With the following instrument(s): Curette to remove Viable and Non-Viable tissue/material. Material removed includes Subcutaneous Tissue after achieving pain control using Other (benzocaine, 20%). No specimens were taken. A time out was conducted at 09:45, prior to the start of the procedure. A Minimum amount of bleeding was controlled with Pressure. The procedure was tolerated well with a pain level of 0 throughout and a pain level of 0 following the procedure. Post Debridement Measurements: 0.3cm length x 0.5cm width x 0.1cm depth; 0.012cm^3 volume. Post debridement Stage noted as Category/Stage II. Character of Wound/Ulcer Post Debridement is improved. Post procedure Diagnosis Wound #4: Same as Pre-Procedure Plan Follow-up Appointments: Return Appointment in 1 week. - Friday Dressing Change Frequency: Wound #3 Right,Lateral Lower Leg: Do not change entire dressing for one week. Wound #4 Left,Medial Foot: Change Dressing every other day. - only to left medial foot wound Skin Barriers/Peri-Wound Care: Moisturizing lotion TCA Cream or Ointment - mixed with lotion to leg Wound Cleansing: May shower with protection. - cast protector Primary Wound Dressing: Wound #3 Right,Lateral Lower Leg: Endoform - moisten with saline Wound #4 Left,Medial Foot: Endoform - moisten with saline Secondary Dressing: Wound #3 Right,Lateral Lower Leg: Dry Gauze Wound #4 Left,Medial Foot: Foam - or tape Dry  Gauze Edema Control: 3 Layer Compression System - Right Lower Extremity Avoid standing for long periods of time Elevate legs to the level of the heart or above for 30 minutes daily and/or when sitting, a frequency of: - throughout the day. Exercise regularly Support Garment 30-40 mm/Hg pressure to: - farrow wrap 4000 for to left leg Off-Loading: Open toe surgical shoe to: - left 1. I change the primary dressing on both wound areas to endoform to see if we can stimulate granulation in the small areas but with some depth. 2. I saw no evidence of infection in either area. 3. The patient is changing the dressing on the left herself and she can go into her external compression garment on the left side. She has 1 in waiting for the right side Electronic Signature(s) Signed: 02/25/2020 8:43:01 AM By: [name] Entered By: Tracy Ham on 02/22/2020 11:13:39 -------------------------------------------------------------------------------- SuperBill Details Patient Name: Date of Service: [name] 02/22/2020 Medical Record ON:[id number] Patient Account Number: [id number] Date of Birth/Sex: Treating RN: [birth year] (86 y.o. [name] Primary Care Provider: Lajean Manes T Other Clinician: Referring Provider: Treating Provider/Extender:Jeniece Hannis, [name], [name] in Treatment: 9 Diagnosis Coding ICD-10 Codes Code Description I87.321 Chronic venous hypertension (idiopathic) with inflammation of right lower extremity L97.812 Non-pressure chronic ulcer of other part of right lower leg with fat layer exposed I87.322 Chronic venous hypertension (idiopathic) with inflammation of left lower extremity L97.522 Non-pressure chronic ulcer of other part of left foot with fat layer exposed Facility Procedures The patient participates with Medicare or their insurance follows the Medicare Facility Guidelines: CPT4 Code Description Modifier Quantity [id number]  - DEB SUBQ TISSUE 20 SQ CM/< 1 ICD-10 Diagnosis Description [id number] Non-pressure chronic ulcer of  other part of right lower leg with fat layer exposed L97.522 Non-pressure chronic ulcer of other part of left foot with fat layer exposed Physician Procedures CPT4 Code Description: [id number] - WC PHYS SUBQ TISS 20 SQ CM ICD-10 Diagnosis Description L97.812 Non-pressure chronic ulcer of other part of right lower  le [phone] Non-pressure chronic ulcer of other part of left foot with Modifier: g with fat laye fat layer expo Quantity: 1 r exposed sed Electronic Signature(s) Signed: 02/25/2020 8:43:01 AM By: [name] Entered By: Tracy Ham on 02/22/2020 11:14:13

## 2020-02-25 NOTE — Progress Notes (Addendum)
[name], [name] ([id number]) Visit Report for 02/22/2020 Arrival Information Details Patient Name: Date of Service: [name], [name] 02/22/2020 9:15 AM Medical Record [id number] Patient Account Number: [id number] Date of Birth/Sex: Treating RN: 10-05-1933 (86 y.o. [name] Primary Care Candice Lunney: Lajean Manes T Other Clinician: Referring Phillip Maffei: Treating Keilon Ressel/Extender:Robson, Secundino Ginger, Hal T Weeks in Treatment: 9 Visit Information History Since Last Visit Added or deleted any medications: No Patient Arrived: Ambulatory Any new allergies or adverse reactions: No Arrival Time: 09:08 Had a fall or experienced change in No Accompanied By: self activities of daily living that may affect Transfer Assistance: None risk of falls: Patient Identification Verified: Yes Signs or symptoms of abuse/neglect since last No Secondary Verification Process Yes visito Completed: Hospitalized since last visit: No Patient Requires Transmission-Based No Implantable device outside of the clinic excluding No Precautions: cellular tissue based products placed in the center Patient Has Alerts: Yes since last visit: Patient Alerts: Patient on Blood Has Dressing in Place as Prescribed: Yes Thinner Has Compression in Place as Prescribed: Yes Right ABI:0.99 Pain Present Now: No Electronic Signature(s) Signed: 02/22/2020 5:31:10 PM By: [name] RN, BSN Entered By: [name] on 02/22/2020 09:12:36 -------------------------------------------------------------------------------- Compression Therapy Details Patient Name: Date of Service: [name] 02/22/2020 9:15 AM Medical Record [id number] Patient Account Number: [id number] Date of Birth/Sex: Treating RN: 07/16/1933 (86 y.o. [name] Primary Care Karenann Mcgrory: Patrina Levering Other Clinician: Referring Shavelle Runkel: Treating Bama Hanselman/Extender:Robson, Secundino Ginger, Hal T Weeks in  Treatment: 9 Compression Therapy Performed for Wound Wound #3 Right,Lateral Lower Leg Assessment: Performed By: Clinician Deon Pilling, RN Compression Type: Three Layer Post Procedure Diagnosis Same as Pre-procedure Electronic Signature(s) Signed: 02/22/2020 5:31:36 PM By: Kela Millin Entered By: Kela Millin on 02/22/2020 09:47:43 -------------------------------------------------------------------------------- Encounter Discharge Information Details Patient Name: Date of Service: [name] 02/22/2020 9:15 AM Medical Record [id number] Patient Account Number: [id number] Date of Birth/Sex: Treating RN: Sep 26, 1933 (86 y.o. [name] Primary Care Kalyn Hofstra: Patrina Levering Other Clinician: Referring Atisha Hamidi: Treating Halima Fogal/Extender:Robson, Secundino Ginger, Hal T Weeks in Treatment: 9 Encounter Discharge Information Items Post Procedure Vitals Discharge Condition: Stable Temperature (F): 97.7 Ambulatory Status: Ambulatory Pulse (bpm): 82 Discharge Destination: Home Respiratory Rate (breaths/min): 18 Transportation: Private Auto Blood Pressure (mmHg): 138/69 Accompanied By: self Schedule Follow-up Appointment: Yes Clinical Summary of Care: Electronic Signature(s) Signed: 02/22/2020 5:45:00 PM By: Deon Pilling Entered By: Deon Pilling on 02/22/2020 10:17:55 -------------------------------------------------------------------------------- Lower Extremity Assessment Details Patient Name: Date of Service: [name], [name] 02/22/2020 9:15 AM Medical Record [id number] Patient Account Number: [id number] Date of Birth/Sex: Treating RN: [birth year] (86 y.o. [name] Primary Care Angelina Neece: Lajean Manes T Other Clinician: Referring Monnie Anspach: Treating Gaspare Netzel/Extender:Robson, Secundino Ginger, Hal T Weeks in Treatment: 9 Edema Assessment Assessed: [Left: No] [Right: No] Edema: [Left: No] [Right: No] Calf Left: Right: Point of  Measurement: 42 cm From Medial Instep 33 cm 31.2 cm Ankle Left: Right: Point of Measurement: 13 cm From Medial Instep 21 cm 19.4 cm Vascular Assessment Pulses: Dorsalis Pedis Palpable: [Left:Yes] [Right:Yes] Electronic Signature(s) Signed: 02/22/2020 5:31:10 PM By: [name] RN, BSN Entered By: [name] on 02/22/2020 09:22:08 -------------------------------------------------------------------------------- Multi Wound Chart Details Patient Name: Date of Service: [name] 02/22/2020 9:15 AM Medical Record [id number] Patient Account Number: [id number] Date of Birth/Sex: Treating RN: 1933/06/10 (86 y.o. [name] Primary Care Evita Merida: Patrina Levering Other Clinician: Referring Akai Dollard: Treating Antavion Bartoszek/Extender:Robson, Secundino Ginger, Hal T Weeks in Treatment: 9 Vital Signs Height(in): 69 Pulse(bpm): 82 Weight(lbs): 163  Blood Pressure(mmHg): 138/69 Body Mass Index(BMI): 24 Temperature(F): 97.7 Respiratory 18 Rate(breaths/min): Photos: [3:No Photos] [4:No Photos] [N/A:N/A] Wound Location: [3:Right Lower Leg - Lateral Left Foot - Medial] [N/A:N/A] Wounding Event: [3:Gradually Appeared] [4:Shear/Friction] [N/A:N/A] Primary Etiology: [3:Venous Leg Ulcer] [4:Pressure Ulcer] [N/A:N/A] Comorbid History: [3:Cataracts, Anemia, Arrhythmia, Congestive Heart Failure, Hypertension, Heart Failure, Hypertension, Peripheral Venous Disease, Peripheral Venous Disease, Received Radiation] [4:Cataracts, Anemia, Arrhythmia, Congestive Received  Radiation] [N/A:N/A] Date Acquired: [3:11/19/2019] [4:01/19/2020] [N/A:N/A] Weeks of Treatment: [3:9] [4:4] [N/A:N/A] Wound Status: [3:Open] [4:Open] [N/A:N/A] Measurements L x W x D 1x0.8x0.1 [4:0.3x0.5x0.1] [N/A:N/A] (cm) Area (cm) : [3:0.628] [4:0.118] [N/A:N/A] Volume (cm) : [3:0.063] [4:0.012] [N/A:N/A] % Reduction in Area: [3:-14.20%] [4:-87.30%] [N/A:N/A] % Reduction in Volume: [3:-14.50%] [4:-100.00%]  [N/A:N/A] Classification: [3:Full Thickness Without Exposed Support Structures] [4:Category/Stage II] [N/A:N/A] Exudate Amount: [3:Small] [4:Small] [N/A:N/A] Exudate Type: [3:Serous] [4:Serous] [N/A:N/A] Exudate Color: [3:amber] [4:amber] [N/A:N/A] Wound Margin: [3:Flat and Intact] [4:Flat and Intact] [N/A:N/A] Granulation Amount: [3:Medium (34-66%)] [4:None Present (0%)] [N/A:N/A] Granulation Quality: [3:Red] [4:N/A] [N/A:N/A] Necrotic Amount: [3:Medium (34-66%)] [4:Large (67-100%)] [N/A:N/A] Exposed Structures: [3:Fat Layer (Subcutaneous Fat Layer (Subcutaneous Tissue) Exposed: Yes Fascia: No Tendon: No Muscle: No Joint: No Bone: No] [4:Tissue) Exposed: Yes Fascia: No Tendon: No Muscle: No Joint: No Bone: No] [N/A:N/A] Epithelialization: [3:Small (1-33%)] [4:Small (1-33%)] [N/A:N/A] Debridement: [3:Debridement - Excisional Debridement - Excisional] [N/A:N/A] Pre-procedure [3:09:45] [4:09:45] [N/A:N/A] Verification/Time Out Taken: Pain Control: [3:Other] [4:Other] [N/A:N/A] Tissue Debrided: [3:Subcutaneous, Slough] [4:Subcutaneous] [N/A:N/A] Level: [3:Skin/Subcutaneous Tissue] [4:Skin/Subcutaneous Tissue] [N/A:N/A] Debridement Area (sq cm):0.8 [4:0.15] [N/A:N/A] Instrument: [3:Curette] [4:Curette] [N/A:N/A] Bleeding: [3:Minimum] [4:Minimum] [N/A:N/A] Hemostasis Achieved: [3:Pressure] [4:Pressure] [N/A:N/A] Procedural Pain: [3:0] [4:0] [N/A:N/A] Post Procedural Pain: [3:0] [4:0] [N/A:N/A] Debridement Treatment Procedure was tolerated [4:Procedure was tolerated] [N/A:N/A] Response: [3:well] [4:well] Post Debridement [3:1x0.8x0.1] [4:0.3x0.5x0.1] [N/A:N/A] Measurements L x W x D (cm) Post Debridement [3:0.063] [4:0.012] [N/A:N/A] Volume: (cm) Post Debridement Stage: N/A [4:Category/Stage II] [N/A:N/A] Procedures Performed: Compression Therapy [3:Debridement] [4:Debridement] [N/A:N/A] Treatment Notes Wound #3 (Right, Lateral Lower Leg) 1. Cleanse With Wound Cleanser Soap and  water 2. Periwound Care Moisturizing lotion TCA Cream 3. Primary Dressing Applied Endoform 4. Secondary Dressing Dry Gauze 6. Support Layer Applied 3 layer compression wrap Notes netting. Wound #4 (Left, Medial Foot) 1. Cleanse With Wound Cleanser 2. Periwound Care Skin Prep 3. Primary Dressing Applied Endoform 4. Secondary Dressing Foam Border Dressing 5. Secured With Self Adhesive Bandage 6. Support Layer Applied Compression garment Notes educated patient on new primary dressing. educated and demonstrated how to apply jobst farrow wrap 4000. Electronic Signature(s) Signed: 02/22/2020 5:31:36 PM By: Kela Millin Signed: 02/25/2020 8:43:01 AM By: Linton Ham MD Entered By: Linton Ham on 02/22/2020 11:09:47 -------------------------------------------------------------------------------- Multi-Disciplinary Care Plan Details Patient Name: Date of Service: [name] 02/22/2020 9:15 AM Medical Record [id number] Patient Account Number: [id number] Date of Birth/Sex: Treating RN: 1933-02-12 (86 y.o. [name] Primary Care Veeda Virgo: Patrina Levering Other Clinician: Referring Cletis Muma: Treating Sereena Marando/Extender:Robson, Secundino Ginger, Hal T Weeks in Treatment: 9 Active Inactive Abuse / Safety / Falls / Self Care Management Nursing Diagnoses: Potential for falls Goals: Patient/caregiver will verbalize understanding of skin care regimen Date Initiated: 12/20/2019 Date Inactivated: 02/14/2020 Target Resolution Date: 02/22/2020 Goal Status: Met Patient/caregiver will verbalize/demonstrate understanding of what to do in case of emergency Date Initiated: 12/20/2019 Target Resolution Date: 03/07/2020 Goal Status: Active Interventions: Assess fall risk on admission and as needed Provide education on fall prevention Notes: Pain, Acute or Chronic Nursing Diagnoses: Pain, acute or chronic: actual or potential Potential alteration in comfort,  pain Goals: Patient  will verbalize adequate pain control and receive pain control interventions during procedures as needed Date Initiated: 12/20/2019 Date Inactivated: 02/14/2020 Target Resolution Date: 02/22/2020 Goal Status: Met Patient/caregiver will verbalize comfort level met Date Initiated: 12/20/2019 Target Resolution Date: 03/14/2020 Goal Status: Active Interventions: Complete pain assessment as per visit requirements Provide education on pain management Notes: Wound/Skin Impairment Nursing Diagnoses: Knowledge deficit related to ulceration/compromised skin integrity Goals: Patient/caregiver will verbalize understanding of skin care regimen Date Initiated: 12/20/2019 Target Resolution Date: 03/21/2020 Goal Status: Active Interventions: Assess patient/caregiver ability to perform ulcer/skin care regimen upon admission and as needed Provide education on ulcer and skin care Treatment Activities: Skin care regimen initiated : 12/20/2019 Topical wound management initiated : 12/20/2019 Notes: Electronic Signature(s) Signed: 02/22/2020 5:31:36 PM By: Kela Millin Entered By: Kela Millin on 02/22/2020 09:44:25 -------------------------------------------------------------------------------- Pain Assessment Details Patient Name: Date of Service: [name] 02/22/2020 9:15 AM Medical Record [id number] Patient Account Number: [id number] Date of Birth/Sex: Treating RN: Jan 24, 1933 (86 y.o. [name] Primary Care Ceciley Buist: Lajean Manes T Other Clinician: Referring Vernor Monnig: Treating Damyan Corne/Extender:Robson, Secundino Ginger, Hal T Weeks in Treatment: 9 Active Problems Location of Pain Severity and Description of Pain Patient Has Paino Yes Site Locations Rate the pain. Current Pain Level: 0 Worst Pain Level: 5 Least Pain Level: 0 Character of Pain Describe the Pain: Tender Pain Management and Medication Current Pain Management: Is the Current Pain  Management Adequate: Adequate How does your wound impact your activities of daily livingo Sleep: No Bathing: No Appetite: No Relationship With Others: No Bladder Continence: No Emotions: No Bowel Continence: No Work: No Toileting: No Drive: No Dressing: No Hobbies: No Electronic Signature(s) Signed: 02/22/2020 5:31:10 PM By: [name] RN, BSN Entered By: [name] on 02/22/2020 09:21:09 -------------------------------------------------------------------------------- Patient/Caregiver Education Details Patient Name: Date of Service: [name] 3/12/2021andnbsp9:15 AM Medical Record [id number] Patient Account Number: [id number] Date of Birth/Gender: [birth year] (87 y.o. F) Treating RN: Kela Millin Primary Care Physician: Patrina Levering Other Clinician: Referring Physician: Treating Physician/Extender:Robson, Secundino Ginger, Junius Creamer in Treatment: 9 Education Assessment Education Provided To: Patient Education Topics Provided Pain: Methods: Explain/Verbal Responses: State content correctly Safety: Methods: Explain/Verbal Responses: State content correctly Wound/Skin Impairment: Methods: Explain/Verbal Responses: State content correctly Electronic Signature(s) Signed: 02/22/2020 5:31:36 PM By: Kela Millin Entered By: Kela Millin on 02/22/2020 09:44:46 -------------------------------------------------------------------------------- Wound Assessment Details Patient Name: Date of Service: [name] 02/22/2020 9:15 AM Medical Record [id number] Patient Account Number: [id number] Date of Birth/Sex: Treating RN: October 26, 1933 (86 y.o. [name] Primary Care Norine Reddington: Lajean Manes T Other Clinician: Referring Clara Herbison: Treating Duwan Adrian/Extender:Robson, Secundino Ginger, Hal T Weeks in Treatment: 9 Wound Status Wound Number: 3 Primary Venous Leg Ulcer Etiology: Wound Location: Right Lower Leg -  Lateral Wound Open Wounding Event: Gradually Appeared Status: Date Acquired: 11/19/2019 Comorbid Cataracts, Anemia, Arrhythmia, Congestive Weeks Of Treatment: 9 History: Heart Failure, Hypertension, Peripheral Clustered Wound: No Venous Disease, Received Radiation Photos Wound Measurements Length: (cm) 1 % Reduct Width: (cm) 0.8 % Reduct Depth: (cm) 0.1 Epitheli Area: (cm) 0.628 Tunneli Volume: (cm) 0.063 Undermi Wound Description Classification: Full Thickness Without Exposed Support Foul Odo Structures Slough/F Wound Flat and Intact Margin: Exudate Small Amount: Exudate Serous Type: Exudate amber Color: Wound Bed Granulation Amount: Medium (34-66%) Granulation Quality: Red Fascia E Necrotic Amount: Medium (34-66%) Fat Laye Necrotic Quality: Adherent Slough Tendon E Muscle E Joint Ex Bone Exp r After Cleansing: No ibrino Yes Exposed Structure xposed: No r (Subcutaneous Tissue) Exposed: Yes xposed: No xposed:  No posed: No osed: No ion in Area: -14.2% ion in Volume: -14.5% alization: Small (1-33%) ng: No ning: No Treatment Notes Wound #3 (Right, Lateral Lower Leg) 1. Cleanse With Wound Cleanser Soap and water 2. Periwound Care Moisturizing lotion TCA Cream 3. Primary Dressing Applied Endoform 4. Secondary Dressing Dry Gauze 6. Support Layer Applied 3 layer compression wrap Notes netting. Electronic Signature(s) Signed: 02/25/2020 5:11:29 PM By: Mikeal Hawthorne EMT/HBOT Signed: 02/25/2020 5:25:45 PM By: Kela Millin Previous Signature: 02/22/2020 5:31:10 PM Version By: [name] RN, BSN Entered By: Mikeal Hawthorne on 02/25/2020 11:44:17 -------------------------------------------------------------------------------- Wound Assessment Details Patient Name: Date of Service: [name] 02/22/2020 9:15 AM Medical Record [id number] Patient Account Number: [id number] Date of Birth/Sex: Treating RN: 1933-11-29 (86 y.o. [name] Primary Care Dmarius Reeder: Lajean Manes T Other Clinician: Referring Derrious Bologna: Treating Veria Stradley/Extender:Robson, Secundino Ginger, Hal T Weeks in Treatment: 9 Wound Status Wound Number: 4 Primary Pressure Ulcer Etiology: Wound Location: Left Foot - Medial Wound Open Wounding Event: Shear/Friction Status: Date Acquired: 01/19/2020 Comorbid Cataracts, Anemia, Arrhythmia, Congestive Weeks Of Treatment: 4 History: Heart Failure, Hypertension, Peripheral Clustered Wound: No Venous Disease, Received Radiation Photos Wound Measurements Length: (cm) 0.3 % Reduction in A Width: (cm) 0.5 % Reduction in V Depth: (cm) 0.1 Epithelializatio Area: (cm) 0.118 Tunneling: Volume: (cm) 0.012 Undermining: Wound Description Classification: Category/Stage II Foul Odor After Wound Margin: Flat and Intact Slough/Fibrino Exudate Amount: Small Exudate Type: Serous Exudate Color: amber Wound Bed Granulation Amount: None Present (0%) Necrotic Amount: Large (67-100%) Fascia Exposed: Necrotic Quality: Adherent Slough Fat Layer (Subcu Tendon Exposed: Muscle Exposed: Joint Exposed: Bone Exposed: Cleansing: No Yes Exposed Structure No taneous Tissue) Exposed: Yes No No No No rea: -87.3% olume: -100% n: Small (1-33%) No No Treatment Notes Wound #4 (Left, Medial Foot) 1. Cleanse With Wound Cleanser 2. Periwound Care Skin Prep 3. Primary Dressing Applied Endoform 4. Secondary Dressing Foam Border Dressing 5. Secured With Self Adhesive Bandage 6. Support Layer Applied Compression garment Notes educated patient on new primary dressing. educated and demonstrated how to apply jobst farrow wrap 4000. Electronic Signature(s) Signed: 02/25/2020 5:11:29 PM By: Mikeal Hawthorne EMT/HBOT Signed: 02/25/2020 5:25:45 PM By: Kela Millin Previous Signature: 02/22/2020 5:31:10 PM Version By: [name] RN, BSN Entered By: Mikeal Hawthorne on 02/25/2020  11:43:55 -------------------------------------------------------------------------------- Vitals Details Patient Name: Date of Service: [name] 02/22/2020 9:15 AM Medical Record [id number] Patient Account Number: [id number] Date of Birth/Sex: Treating RN: Dec 28, 1932 (86 y.o. [name] Primary Care Kassiah Mccrory: Lajean Manes T Other Clinician: Referring Faisal Stradling: Treating Shanora Christensen/Extender:Robson, Secundino Ginger, Hal T Weeks in Treatment: 9 Vital Signs Time Taken: 09:12 Temperature (F): 97.7 Height (in): 69 Pulse (bpm): 82 Source: Stated Respiratory Rate (breaths/min): 18 Weight (lbs): 163 Blood Pressure (mmHg): 138/69 Source: Stated Reference Range: 80 - 120 mg / dl Body Mass Index (BMI): 24.1 Electronic Signature(s) Signed: 02/22/2020 5:31:10 PM By: [name] RN, BSN Entered By: [name] on 02/22/2020 57:97:28

## 2020-02-26 ENCOUNTER — Ambulatory Visit (INDEPENDENT_AMBULATORY_CARE_PROVIDER_SITE_OTHER): Payer: Medicare Other | Admitting: *Deleted

## 2020-02-26 DIAGNOSIS — I4819 Other persistent atrial fibrillation: Secondary | ICD-10-CM

## 2020-02-26 LAB — CUP PACEART REMOTE DEVICE CHECK
Battery Remaining Longevity: 109 mo
Battery Voltage: 2.99 V
Brady Statistic AP VP Percent: 0 %
Brady Statistic AP VS Percent: 0 %
Brady Statistic AS VP Percent: 94.86 %
Brady Statistic AS VS Percent: 5.14 %
Brady Statistic RA Percent Paced: 0 %
Brady Statistic RV Percent Paced: 94.86 %
Date Time Interrogation Session: 20210316021213
Implantable Lead Implant Date: 20180905
Implantable Lead Implant Date: 20180905
Implantable Lead Location: 753859
Implantable Lead Location: 753860
Implantable Lead Model: 3830
Implantable Lead Model: 5076
Implantable Pulse Generator Implant Date: 20180905
Lead Channel Impedance Value: 266 Ohm
Lead Channel Impedance Value: 342 Ohm
Lead Channel Impedance Value: 437 Ohm
Lead Channel Impedance Value: 551 Ohm
Lead Channel Pacing Threshold Amplitude: 0.625 V
Lead Channel Pacing Threshold Pulse Width: 0.4 ms
Lead Channel Sensing Intrinsic Amplitude: 1.5 mV
Lead Channel Sensing Intrinsic Amplitude: 1.5 mV
Lead Channel Sensing Intrinsic Amplitude: 2.875 mV
Lead Channel Sensing Intrinsic Amplitude: 3.125 mV
Lead Channel Setting Pacing Amplitude: 2.5 V
Lead Channel Setting Pacing Pulse Width: 0.4 ms
Lead Channel Setting Sensing Sensitivity: 1.2 mV

## 2020-02-27 NOTE — Progress Notes (Signed)
PPM Remote  

## 2020-02-29 ENCOUNTER — Other Ambulatory Visit: Payer: Self-pay

## 2020-02-29 ENCOUNTER — Encounter (HOSPITAL_BASED_OUTPATIENT_CLINIC_OR_DEPARTMENT_OTHER): Payer: Medicare Other | Admitting: Internal Medicine

## 2020-02-29 DIAGNOSIS — I87321 Chronic venous hypertension (idiopathic) with inflammation of right lower extremity: Secondary | ICD-10-CM | POA: Diagnosis not present

## 2020-02-29 NOTE — Progress Notes (Signed)
EVALIE, RIDENHOUR ([id number]) Visit Report for 02/29/2020 Debridement Details Patient Name: Date of Service: [name], [name] 02/29/2020 9:15 AM Medical Record [id number] Patient Account Number: [id number] Date of Birth/Sex: Treating RN: 1933/07/25 (86 y.o. [name] Primary Care Provider: Lajean Manes T Other Clinician: Referring Provider: Treating Provider/Extender:Robson, Secundino Ginger, Hal T Weeks in Treatment: 10 Debridement Performed for Wound #4 Left,Medial Foot Assessment: Performed By: Physician Ricard Dillon., MD Debridement Type: Debridement Level of Consciousness (Pre- Awake and Alert procedure): Pre-procedure Yes - 09:30 Verification/Time Out Taken: Start Time: 09:30 Pain Control: Other : benzocaine, 20% Total Area Debrided (L x W): 0.8 (cm) x 0.7 (cm) = 0.56 (cm) Tissue and other material Viable, Non-Viable, Callus, Skin: Dermis debrided: Level: Skin/Dermis Debridement Description: Selective/Open Wound Instrument: Curette Bleeding: Minimum Hemostasis Achieved: Pressure End Time: 09:31 Procedural Pain: 0 Post Procedural Pain: 0 Response to Treatment: Procedure was tolerated well Level of Consciousness Awake and Alert (Post-procedure): Post Debridement Measurements of Total Wound Length: (cm) 0.8 Stage: Category/Stage II Width: (cm) 0.7 Depth: (cm) 0.2 Volume: (cm) 0.088 Character of Wound/Ulcer Post Improved Debridement: Post Procedure Diagnosis Same as Pre-procedure Electronic Signature(s) Signed: 02/29/2020 5:09:02 PM By: Linton Ham MD Signed: 02/29/2020 5:10:17 PM By: Kela Millin Entered By: Linton Ham on 02/29/2020 10:48:55 -------------------------------------------------------------------------------- HPI Details Patient Name: Date of Service: [name] 02/29/2020 9:15 AM Medical Record [id number] Patient Account Number: [id number] Date of Birth/Sex: Treating RN: May 11, 1933 (86 y.o. [name] Primary Care Provider: Patrina Levering Other Clinician: Referring Provider: Treating Provider/Extender:Robson, Secundino Ginger, Hal T Weeks in Treatment: 10 History of Present Illness HPI Description: ADMISSION 12/20/2019 This is an 86 year old woman referred by her primary physician Dr. Felipa Eth for review of a wound on her right lateral lower leg. She was actually in this clinic on 2 separate occasions in 2010 and 2012 cared for by Dr. Sherilyn Cooter. At that point in time she had wounds on her right leg as well. She tells [place] to 1 month ago she noticed a scab building up on her right lateral lower leg this opened into a wound. There was no overt cause of this no trauma, no infection that she is aware of. She has a history of chronic venous insufficiency and wears compression stockings fairly religiously indeed she is done well over the last 8 years since she was last in this clinic. She has been applying Vaseline on this and a covering phone. This is not progressing towards healing. Past medical history; interstitial lung disease, chronic atrial fibrillation status post pacemaker, osteoarthritis of the left knee, left breast CA, chronic repeat venous insufficiency. She takes Eliquis for her atrial fibrillation stroke prophylaxis. ABI in our clinic was 0.99 on the right 1/15; superficial wound on the right lateral calf in the setting of severe acute skin changes from chronic venous insufficiency and lymphedema. We used Iodoflex last week she complained of a lot of pain 1/22; this is a small but difficult wound on the right lateral calf in the setting of severe chronic venous insufficiency and secondary lymphedema. She continues to state the wounds things hurts when she is up on it but seems to be relieved by putting her leg up. I changed her to [facility] last week because the Iodoflex seem to be causing stinging. 1/29. This wound appears to be contracting somewhat.  Changes of chronic venous insufficiency with secondary lymphedema 2/12; not as good as surface today and slightly bigger. I had to change her from Iodoflex to [facility]  Blue because of the stinging pain although she does not think it was any better on the [facility]. She comes into clinic today with a area on the medial left great toe. She said she noticed blood on her sock last Saturday. She had some form of bunion surgery by Dr. Ila Mcgill her podiatrist sometime in 2019 she said he "shave the area". I could not find his operative report although I did see reference to the bunion in the area. 2/19; somewhat improved wound on the right lateral lower leg however the wound she came in on the bunion of her left great toe is actually I think larger still somewhat inflamed. We have been using Iodoflex 2/26; not much improvement in either wound area which is the original venous wound on the right lateral lower leg and in the area on the bunion of her left great toe. This still looks somewhat inflamed and tender. We have been using Iodoflex with open much improvement. 3/4; in general both her wounds look better this includes the original venous insufficiency wound on the right lateral lower leg and the area on her tip of her bunion on the left great toe medial aspect of the MTP. Change to Hydrofera Blue last week 3/12; the patient still has a small geographic shaped wound on the right lateral lower leg. We have been using Hydrofera Blue but I changed her to endoform today. She also has the area in the tip of the bunion of the left great toe. We will try Hydrofera Blue here as well 3/19; the small geographic wound on the right lateral lower leg has not filled in. There is some surrounding induration we have noticed this previously. This may be venous inflammation but I wonder about biopsying this if this is not closing up. The area over the left medial first MTP [bunion deformity] requires  debridement. This is not closing in. I am not convinced she is offloading adequately Electronic Signature(s) Signed: 02/29/2020 5:09:02 PM By: Linton Ham MD Entered By: Linton Ham on 02/29/2020 10:49:53 -------------------------------------------------------------------------------- Physical Exam Details Patient Name: Date of Service: Hem, Ellora J. 02/29/2020 9:15 AM Medical Record [id number] Patient Account Number: [id number] Date of Birth/Sex: Treating RN: 05-23-1933 (86 y.o. [name] Primary Care Provider: Patrina Levering Other Clinician: Referring Provider: Treating Provider/Extender:Robson, Secundino Ginger, Hal T Weeks in Treatment: 10 Constitutional Sitting or standing Blood Pressure is within target range for patient.. Pulse regular and within target range for patient.Marland Kitchen Respirations regular, non-labored and within target range.. Temperature is normal and within the target range for the patient.Marland Kitchen Appears in no distress. Notes Wound exam; the area that we have been following is on the right lateral lower leg this is a irregular geographic shaped in the middle of chronic venous changes and lymphedema. The surface of it looks healthy but raised senescent edges. The area on the tip of the bunion on her first medial left MTP required debridement with a #3 curette removing skin and subcutaneous tissue. This is not healed over either. Electronic Signature(s) Signed: 02/29/2020 5:09:02 PM By: Linton Ham MD Entered By: Linton Ham on 02/29/2020 10:51:02 -------------------------------------------------------------------------------- Physician Orders Details Patient Name: Date of Service: [name] 02/29/2020 9:15 AM Medical Record [id number] Patient Account Number: [id number] Date of Birth/Sex: Treating RN: 06-23-1933 (86 y.o. [name] Primary Care Provider: Patrina Levering Other Clinician: Referring Provider:  Treating Provider/Extender:Robson, Secundino Ginger, Hal T Weeks in Treatment: 10 Verbal / Phone Orders: No Diagnosis Coding ICD-10 Coding  Code Description I87.321 Chronic venous hypertension (idiopathic) with inflammation of right lower extremity L97.812 Non-pressure chronic ulcer of other part of right lower leg with fat layer exposed I87.322 Chronic venous hypertension (idiopathic) with inflammation of left lower extremity L97.522 Non-pressure chronic ulcer of other part of left foot with fat layer exposed Follow-up Appointments Return Appointment in 1 week. - Friday Dressing Change Frequency Wound #3 Right,Lateral Lower Leg Do not change entire dressing for one week. Wound #4 Left,Medial Foot Change Dressing every other day. - only to left medial foot wound Skin Barriers/Peri-Wound Care Moisturizing lotion TCA Cream or Ointment - mixed with lotion to leg, TCA over wound bed as well Wound Cleansing May shower with protection. - cast protector Primary Wound Dressing Wound #3 Right,Lateral Lower Leg Endoform - moisten with saline Wound #4 Left,Medial Foot Endoform - moisten with saline Secondary Dressing Wound #3 Right,Lateral Lower Leg Dry Gauze Wound #4 Left,Medial Foot Foam - or tape Dry Gauze Edema Control 3 Layer Compression System - Right Lower Extremity Avoid standing for long periods of time Elevate legs to the level of the heart or above for 30 minutes daily and/or when sitting, a frequency of: - throughout the day. Exercise regularly Support Garment 30-40 mm/Hg pressure to: - farrow wrap 4000 for to left leg Off-Loading Open toe surgical shoe to: - left Electronic Signature(s) Signed: 02/29/2020 5:09:02 PM By: Linton Ham MD Signed: 02/29/2020 5:10:17 PM By: Kela Millin Entered By: Kela Millin on 02/29/2020 09:33:43 -------------------------------------------------------------------------------- Problem List Details Patient Name: Date of  Service: [name] 02/29/2020 9:15 AM Medical Record [id number] Patient Account Number: [id number] Date of Birth/Sex: Treating RN: May 12, 1933 (86 y.o. [name] Primary Care Provider: Lajean Manes T Other Clinician: Referring Provider: Treating Provider/Extender:Robson, Secundino Ginger, Junius Creamer in Treatment: 10 Active Problems ICD-10 Evaluated Encounter Code Description Active Date Today Diagnosis I87.321 Chronic venous hypertension (idiopathic) with 12/20/2019 No Yes inflammation of right lower extremity L97.812 Non-pressure chronic ulcer of other part of right lower 12/20/2019 No Yes leg with fat layer exposed I87.322 Chronic venous hypertension (idiopathic) with 02/14/2020 No Yes inflammation of left lower extremity L97.522 Non-pressure chronic ulcer of other part of left foot 01/25/2020 No Yes with fat layer exposed Inactive Problems Resolved Problems Electronic Signature(s) Signed: 02/29/2020 5:09:02 PM By: Linton Ham MD Entered By: Linton Ham on 02/29/2020 10:48:36 -------------------------------------------------------------------------------- Progress Note Details Patient Name: Date of Service: [name] 02/29/2020 9:15 AM Medical Record [id number] Patient Account Number: [id number] Date of Birth/Sex: Treating RN: 11-Jan-1933 (86 y.o. [name] Primary Care Provider: Patrina Levering Other Clinician: Referring Provider: Treating Provider/Extender:Robson, Secundino Ginger, Hal T Weeks in Treatment: 10 Subjective History of Present Illness (HPI) ADMISSION 12/20/2019 This is an 86 year old woman referred by her primary physician Dr. Felipa Eth for review of a wound on her right lateral lower leg. She was actually in this clinic on 2 separate occasions in 2010 and 2012 cared for by Dr. Sherilyn Cooter. At that point in time she had wounds on her right leg as well. She tells [place] to 1 month ago she noticed a scab building up  on her right lateral lower leg this opened into a wound. There was no overt cause of this no trauma, no infection that she is aware of. She has a history of chronic venous insufficiency and wears compression stockings fairly religiously indeed she is done well over the last 8 years since she was last in this clinic. She has been applying Vaseline on this and  a covering phone. This is not progressing towards healing. Past medical history; interstitial lung disease, chronic atrial fibrillation status post pacemaker, osteoarthritis of the left knee, left breast CA, chronic repeat venous insufficiency. She takes Eliquis for her atrial fibrillation stroke prophylaxis. ABI in our clinic was 0.99 on the right 1/15; superficial wound on the right lateral calf in the setting of severe acute skin changes from chronic venous insufficiency and lymphedema. We used Iodoflex last week she complained of a lot of pain 1/22; this is a small but difficult wound on the right lateral calf in the setting of severe chronic venous insufficiency and secondary lymphedema. She continues to state the wounds things hurts when she is up on it but seems to be relieved by putting her leg up. I changed her to [facility] last week because the Iodoflex seem to be causing stinging. 1/29. This wound appears to be contracting somewhat. Changes of chronic venous insufficiency with secondary lymphedema 2/12; not as good as surface today and slightly bigger. I had to change her from Iodoflex to [facility] because of the stinging pain although she does not think it was any better on the [facility]. ooShe comes into clinic today with a area on the medial left great toe. She said she noticed blood on her sock last Saturday. She had some form of bunion surgery by Dr. Ila Mcgill her podiatrist sometime in 2019 she said he "shave the area". I could not find his operative report although I did see reference to the bunion in the  area. 2/19; somewhat improved wound on the right lateral lower leg however the wound she came in on the bunion of her left great toe is actually I think larger still somewhat inflamed. We have been using Iodoflex 2/26; not much improvement in either wound area which is the original venous wound on the right lateral lower leg and in the area on the bunion of her left great toe. This still looks somewhat inflamed and tender. We have been using Iodoflex with open much improvement. 3/4; in general both her wounds look better this includes the original venous insufficiency wound on the right lateral lower leg and the area on her tip of her bunion on the left great toe medial aspect of the MTP. Change to Hydrofera Blue last week 3/12; the patient still has a small geographic shaped wound on the right lateral lower leg. We have been using Hydrofera Blue but I changed her to endoform today. She also has the area in the tip of the bunion of the left great toe. We will try Hydrofera Blue here as well 3/19; the small geographic wound on the right lateral lower leg has not filled in. There is some surrounding induration we have noticed this previously. This may be venous inflammation but I wonder about biopsying this if this is not closing up. The area over the left medial first MTP [bunion deformity] requires debridement. This is not closing in. I am not convinced she is offloading adequately Objective Constitutional Sitting or standing Blood Pressure is within target range for patient.. Pulse regular and within target range for patient.Marland Kitchen Respirations regular, non-labored and within target range.. Temperature is normal and within the target range for the patient.Marland Kitchen Appears in no distress. Vitals Time Taken: 9:12 AM, Height: 69 in, Weight: 163 lbs, BMI: 24.1, Temperature: 97.6 F, Pulse: 75 bpm, Respiratory Rate: 18 breaths/min, Blood Pressure: 122/60 mmHg. General Notes: Wound exam; the area that we  have been  following is on the right lateral lower leg this is a irregular geographic shaped in the middle of chronic venous changes and lymphedema. The surface of it looks healthy but raised senescent edges. ooThe area on the tip of the bunion on her first medial left MTP required debridement with a #3 curette removing skin and subcutaneous tissue. This is not healed over either. Integumentary (Hair, Skin) Wound #3 status is Open. Original cause of wound was Gradually Appeared. The wound is located on the Right,Lateral Lower Leg. The wound measures 1cm length x 0.5cm width x 0.2cm depth; 0.393cm^2 area and 0.079cm^3 volume. There is Fat Layer (Subcutaneous Tissue) Exposed exposed. There is no tunneling or undermining noted. There is a small amount of serous drainage noted. The wound margin is flat and intact. There is large (67-100%) red granulation within the wound bed. There is a small (1-33%) amount of necrotic tissue within the wound bed including Adherent Slough. Wound #4 status is Open. Original cause of wound was Shear/Friction. The wound is located on the Left,Medial Foot. The wound measures 0.8cm length x 0.7cm width x 0.2cm depth; 0.44cm^2 area and 0.088cm^3 volume. There is Fat Layer (Subcutaneous Tissue) Exposed exposed. There is no tunneling or undermining noted. There is a small amount of serous drainage noted. The wound margin is flat and intact. There is small (1-33%) pink, pale granulation within the wound bed. There is a large (67-100%) amount of necrotic tissue within the wound bed including Adherent Slough. Assessment Active Problems ICD-10 Chronic venous hypertension (idiopathic) with inflammation of right lower extremity Non-pressure chronic ulcer of other part of right lower leg with fat layer exposed Chronic venous hypertension (idiopathic) with inflammation of left lower extremity Non-pressure chronic ulcer of other part of left foot with fat layer  exposed Procedures Wound #4 Pre-procedure diagnosis of Wound #4 is a Pressure Ulcer located on the Left,Medial Foot . There was a Selective/Open Wound Skin/Dermis Debridement with a total area of 0.56 sq cm performed by Ricard Dillon., MD. With the following instrument(s): Curette to remove Viable and Non-Viable tissue/material. Material removed includes Callus and Skin: Dermis and after achieving pain control using Other (benzocaine, 20%). No specimens were taken. A time out was conducted at 09:30, prior to the start of the procedure. A Minimum amount of bleeding was controlled with Pressure. The procedure was tolerated well with a pain level of 0 throughout and a pain level of 0 following the procedure. Post Debridement Measurements: 0.8cm length x 0.7cm width x 0.2cm depth; 0.088cm^3 volume. Post debridement Stage noted as Category/Stage II. Character of Wound/Ulcer Post Debridement is improved. Post procedure Diagnosis Wound #4: Same as Pre-Procedure Wound #3 Pre-procedure diagnosis of Wound #3 is a Venous Leg Ulcer located on the Right,Lateral Lower Leg . There was a Three Layer Compression Therapy Procedure by Deon Pilling, RN. Post procedure Diagnosis Wound #3: Same as Pre-Procedure Plan Follow-up Appointments: Return Appointment in 1 week. - Friday Dressing Change Frequency: Wound #3 Right,Lateral Lower Leg: Do not change entire dressing for one week. Wound #4 Left,Medial Foot: Change Dressing every other day. - only to left medial foot wound Skin Barriers/Peri-Wound Care: Moisturizing lotion TCA Cream or Ointment - mixed with lotion to leg, TCA over wound bed as well Wound Cleansing: May shower with protection. - cast protector Primary Wound Dressing: Wound #3 Right,Lateral Lower Leg: Endoform - moisten with saline Wound #4 Left,Medial Foot: Endoform - moisten with saline Secondary Dressing: Wound #3 Right,Lateral Lower Leg: Dry Gauze Wound #4 Left,Medial  Foot: Foam - or tape Dry Gauze Edema Control: 3 Layer Compression System - Right Lower Extremity Avoid standing for long periods of time Elevate legs to the level of the heart or above for 30 minutes daily and/or when sitting, a frequency of: - throughout the day. Exercise regularly Support Garment 30-40 mm/Hg pressure to: - farrow wrap 4000 for to left leg Off-Loading: Open toe surgical shoe to: - left 1. I am continuing endoform to both wound areas 2. I found myself considering a biopsy of the leg on the right lateral. 3. He may need to go back to see podiatry about the wound on the bunion which is previously had surgery Electronic Signature(s) Signed: 02/29/2020 5:09:02 PM By: Linton Ham MD Entered By: Linton Ham on 02/29/2020 10:51:38 -------------------------------------------------------------------------------- SuperBill Details Patient Name: Date of Service: [name] 02/29/2020 Medical Record [id number] Patient Account Number: [id number] Date of Birth/Sex: Treating RN: 03/26/1933 (87 y.o. [name] Primary Care Provider: Lajean Manes T Other Clinician: Referring Provider: Treating Provider/Extender:Robson, Secundino Ginger, Hal T Weeks in Treatment: 10 Diagnosis Coding ICD-10 Codes Code Description I87.321 Chronic venous hypertension (idiopathic) with inflammation of right lower extremity L97.812 Non-pressure chronic ulcer of other part of right lower leg with fat layer exposed I87.322 Chronic venous hypertension (idiopathic) with inflammation of left lower extremity L97.522 Non-pressure chronic ulcer of other part of left foot with fat layer exposed Facility Procedures The patient participates with Medicare or their insurance follows the Medicare Facility Guidelines: CPT4 Code Description Modifier Quantity [id number] - DEBRIDE WOUND 1ST 20 SQ CM OR < 1 ICD-10 Diagnosis Description L97.522 Non-pressure chronic ulcer  of other part  of left foot with fat layer exposed Physician Procedures CPT4 Code Description: [id number] - WC PHYS DEBR WO ANESTH 20 SQ CM ICD-10 Diagnosis Description L97.522 Non-pressure chronic ulcer of other part of left foot with Modifier: fat layer expo Quantity: 1 sed Electronic Signature(s) Signed: 02/29/2020 5:09:02 PM By: Linton Ham MD Entered By: Linton Ham on 02/29/2020 10:51:45

## 2020-02-29 NOTE — Progress Notes (Signed)
AVAYAH, RAFFETY ([id number]) Visit Report for 02/29/2020 Arrival Information Details Patient Name: Date of Service: [name], [name] 02/29/2020 9:15 AM Medical Record [id number] Patient Account Number: [id number] Date of Birth/Sex: Treating RN: [birth year] (86 y.o. Helene Shoe, Meta.Reding Primary Care Jaquarius Seder: Lajean Manes T Other Clinician: Referring Domnique Vantine: Treating Kauan Kloosterman/Extender:Robson, Secundino Ginger, Hal T Weeks in Treatment: 10 Visit Information History Since Last Visit Added or deleted any medications: No Patient Arrived: Ambulatory Any new allergies or adverse reactions: No Arrival Time: 09:10 Had a fall or experienced change in No Accompanied By: self activities of daily living that may affect Transfer Assistance: None risk of falls: Patient Identification Verified: Yes Signs or symptoms of abuse/neglect since last No Secondary Verification Process Yes visito Completed: Hospitalized since last visit: No Patient Requires Transmission-Based No Implantable device outside of the clinic excluding No Precautions: cellular tissue based products placed in the center Patient Has Alerts: Yes since last visit: Patient Alerts: Patient on Blood Has Dressing in Place as Prescribed: Yes Thinner Has Compression in Place as Prescribed: Yes Right ABI:0.99 Pain Present Now: Yes Electronic Signature(s) Signed: 02/29/2020 5:19:35 PM By: Deon Pilling Entered By: Deon Pilling on 02/29/2020 09:21:24 -------------------------------------------------------------------------------- Compression Therapy Details Patient Name: Date of Service: [name], Tracy J. 02/29/2020 9:15 AM Medical Record [id number] Patient Account Number: [id number] Date of Birth/Sex: Treating RN: 04/17/1933 (86 y.o. [name] Primary Care Shriley Joffe: Patrina Levering Other Clinician: Referring Von Quintanar: Treating Franshesca Chipman/Extender:Robson, Secundino Ginger, Hal T Weeks in Treatment:  10 Compression Therapy Performed for Wound Wound #3 Right,Lateral Lower Leg Assessment: Performed By: Clinician Deon Pilling, RN Compression Type: Three Layer Post Procedure Diagnosis Same as Pre-procedure Electronic Signature(s) Signed: 02/29/2020 5:10:17 PM By: Kela Millin Entered By: Kela Millin on 02/29/2020 09:28:07 -------------------------------------------------------------------------------- Encounter Discharge Information Details Patient Name: Date of Service: [name] 02/29/2020 9:15 AM Medical Record [id number] Patient Account Number: [id number] Date of Birth/Sex: Treating RN: Nov 29, 1933 (86 y.o. [name] Primary Care Zavia Pullen: Patrina Levering Other Clinician: Referring Ayodeji Keimig: Treating Jessicalynn Deshong/Extender:Robson, Secundino Ginger, Hal T Weeks in Treatment: 10 Encounter Discharge Information Items Post Procedure Vitals Discharge Condition: Stable Temperature (F): 97.6 Ambulatory Status: Ambulatory Pulse (bpm): 75 Discharge Destination: Home Respiratory Rate (breaths/min): 18 Transportation: Private Auto Blood Pressure (mmHg): 122/60 Accompanied By: self Schedule Follow-up Appointment: Yes Clinical Summary of Care: Electronic Signature(s) Signed: 02/29/2020 5:19:35 PM By: Deon Pilling Entered By: Deon Pilling on 02/29/2020 10:04:49 -------------------------------------------------------------------------------- Lower Extremity Assessment Details Patient Name: Date of Service: [name], [name] 02/29/2020 9:15 AM Medical Record [id number] Patient Account Number: [id number] Date of Birth/Sex: Treating RN: 25-Sep-1933 (86 y.o. [name] Primary Care Petrea Fredenburg: Lajean Manes T Other Clinician: Referring Tamkia Temples: Treating Legaci Tarman/Extender:Robson, Secundino Ginger, Hal T Weeks in Treatment: 10 Edema Assessment Assessed: [Left: Yes] [Right: Yes] Edema: [Left: No] [Right: No] Calf Left: Right: Point of Measurement:  42 cm From Medial Instep 33 cm 31 cm Ankle Left: Right: Point of Measurement: 13 cm From Medial Instep 20 cm 19 cm Vascular Assessment Pulses: Dorsalis Pedis Palpable: [Left:Yes] [Right:Yes] Electronic Signature(s) Signed: 02/29/2020 5:19:35 PM By: Deon Pilling Entered By: Deon Pilling on 02/29/2020 09:22:24 -------------------------------------------------------------------------------- Multi Wound Chart Details Patient Name: Date of Service: [name] 02/29/2020 9:15 AM Medical Record [id number] Patient Account Number: [id number] Date of Birth/Sex: Treating RN: [birth year] (86 y.o. [name] Primary Care Kyree Fedorko: Patrina Levering Other Clinician: Referring Malikai Gut: Treating Kathlene Yano/Extender:Robson, Secundino Ginger, Hal T Weeks in Treatment: 10 Vital Signs Height(in): 69 Pulse(bpm): 75 Weight(lbs): 163 Blood Pressure(mmHg): 122/60 Body  Mass Index(BMI): 24 Temperature(F): 97.6 Respiratory 18 Rate(breaths/min): Photos: [3:No Photos] [4:No Photos] [N/A:N/A] Wound Location: [3:Right Lower Leg - Lateral Left Foot - Medial] [N/A:N/A] Wounding Event: [3:Gradually Appeared] [4:Shear/Friction] [N/A:N/A] Primary Etiology: [3:Venous Leg Ulcer] [4:Pressure Ulcer] [N/A:N/A] Comorbid History: [3:Cataracts, Anemia, Arrhythmia, Congestive Heart Failure, Hypertension, Heart Failure, Hypertension, Peripheral Venous Disease, Peripheral Venous Disease, Received Radiation] [4:Cataracts, Anemia, Arrhythmia, Congestive Received  Radiation] [N/A:N/A] Date Acquired: [3:11/19/2019] [4:01/19/2020] [N/A:N/A] Weeks of Treatment: [3:10] [4:5] [N/A:N/A] Wound Status: [3:Open] [4:Open] [N/A:N/A] Measurements L x W x D 1x0.5x0.2 [4:0.8x0.7x0.2] [N/A:N/A] (cm) Area (cm) : [3:0.393] [4:0.44] [N/A:N/A] Volume (cm) : [3:0.079] [4:0.088] [N/A:N/A] % Reduction in Area: [3:28.50%] [4:-598.40%] [N/A:N/A] % Reduction in Volume: [3:-43.60%] [4:-1366.70%] [N/A:N/A] Classification:  [3:Full Thickness Without Exposed Support Structures] [4:Category/Stage II] [N/A:N/A] Exudate Amount: [3:Small] [4:Small] [N/A:N/A] Exudate Type: [3:Serous] [4:Serous] [N/A:N/A] Exudate Color: [3:amber] [4:amber] [N/A:N/A] Wound Margin: [3:Flat and Intact] [4:Flat and Intact] [N/A:N/A] Granulation Amount: [3:Large (67-100%)] [4:Small (1-33%)] [N/A:N/A] Granulation Quality: [3:Red] [4:Pink, Pale] [N/A:N/A] Necrotic Amount: [3:Small (1-33%)] [4:Large (67-100%)] [N/A:N/A] Exposed Structures: [3:Fat Layer (Subcutaneous Fat Layer (Subcutaneous Tissue) Exposed: Yes Fascia: No Tendon: No Muscle: No Joint: No Bone: No] [4:Tissue) Exposed: Yes Fascia: No Tendon: No Muscle: No Joint: No Bone: No] [N/A:N/A] Epithelialization: [3:Small (1-33%)] [4:Small (1-33%)] [N/A:N/A] Debridement: [3:N/A] [4:Debridement - Selective/Open Wound] [N/A:N/A] Pre-procedure [3:N/A] [4:09:30] [N/A:N/A] Verification/Time Out Taken: Pain Control: [3:N/A] [4:Other] [N/A:N/A] Tissue Debrided: [3:N/A] [4:Callus] [N/A:N/A] Level: [3:N/A] [4:Skin/Dermis] [N/A:N/A] Debridement Area (sq cm):N/A [4:0.56] [N/A:N/A] Instrument: [3:N/A] [4:Curette] [N/A:N/A] Bleeding: [3:N/A] [4:Minimum] [N/A:N/A] Hemostasis Achieved: [3:N/A] [4:Pressure] [N/A:N/A] Procedural Pain: [3:N/A] [4:0] [N/A:N/A] Post Procedural Pain: [3:N/A] [4:0] [N/A:N/A] Debridement Treatment N/A [4:Procedure was tolerated] [N/A:N/A] Response: [4:well] Post Debridement [3:N/A] [4:0.8x0.7x0.2] [N/A:N/A] Measurements L x W x D (cm) Post Debridement [3:N/A] [4:0.088] [N/A:N/A] Volume: (cm) Post Debridement Stage: N/A [4:Category/Stage II Debridement] [N/A:N/A N/A] Treatment Notes Wound #3 (Right, Lateral Lower Leg) 1. Cleanse With Wound Cleanser Soap and water 2. Periwound Care Moisturizing lotion TCA Cream 3. Primary Dressing Applied Endoform 4. Secondary Dressing Dry Gauze 6. Support Layer Applied 3 layer compression wrap Notes netting. Wound #4  (Left, Medial Foot) 1. Cleanse With Wound Cleanser Soap and water 2. Periwound Care Skin Prep 3. Primary Dressing Applied Endoform 4. Secondary Dressing Dry Gauze Foam 5. Secured With Medipore tape 6. Support Layer Applied Compression garment Notes Applied jobst farrow wrap 4000. Electronic Signature(s) Signed: 02/29/2020 5:09:02 PM By: Linton Ham MD Signed: 02/29/2020 5:10:17 PM By: Kela Millin Entered By: Linton Ham on 02/29/2020 10:48:42 -------------------------------------------------------------------------------- Multi-Disciplinary Care Plan Details Patient Name: Date of Service: [name] 02/29/2020 9:15 AM Medical Record [id number] Patient Account Number: [id number] Date of Birth/Sex: Treating RN: 1933-05-10 (86 y.o. [name] Primary Care Alice Burnside: Patrina Levering Other Clinician: Referring Dayana Dalporto: Treating Camarion Weier/Extender:Robson, Secundino Ginger, Hal T Weeks in Treatment: 10 Active Inactive Abuse / Safety / Falls / Self Care Management Nursing Diagnoses: Potential for falls Goals: Patient/caregiver will verbalize understanding of skin care regimen Date Initiated: 12/20/2019 Date Inactivated: 02/14/2020 Target Resolution Date: 02/22/2020 Goal Status: Met Patient/caregiver will verbalize/demonstrate understanding of what to do in case of emergency Date Initiated: 12/20/2019 Target Resolution Date: 03/07/2020 Goal Status: Active Interventions: Assess fall risk on admission and as needed Provide education on fall prevention Notes: Pain, Acute or Chronic Nursing Diagnoses: Pain, acute or chronic: actual or potential Potential alteration in comfort, pain Goals: Patient will verbalize adequate pain control and receive pain control interventions during procedures as needed Date Initiated: 12/20/2019 Date Inactivated: 02/14/2020 Target Resolution Date: 02/22/2020 Goal Status:  Met Patient/caregiver will verbalize comfort  level met Date Initiated: 12/20/2019 Target Resolution Date: 03/14/2020 Goal Status: Active Interventions: Complete pain assessment as per visit requirements Provide education on pain management Notes: Wound/Skin Impairment Nursing Diagnoses: Knowledge deficit related to ulceration/compromised skin integrity Goals: Patient/caregiver will verbalize understanding of skin care regimen Date Initiated: 12/20/2019 Target Resolution Date: 03/21/2020 Goal Status: Active Interventions: Assess patient/caregiver ability to perform ulcer/skin care regimen upon admission and as needed Provide education on ulcer and skin care Treatment Activities: Skin care regimen initiated : 12/20/2019 Topical wound management initiated : 12/20/2019 Notes: Electronic Signature(s) Signed: 02/29/2020 5:10:17 PM By: Kela Millin Entered By: Kela Millin on 02/29/2020 09:11:18 -------------------------------------------------------------------------------- Pain Assessment Details Patient Name: Date of Service: [name], [name] 02/29/2020 9:15 AM Medical Record [id number] Patient Account Number: [id number] Date of Birth/Sex: Treating RN: 06/05/1933 (86 y.o. [name] Primary Care Tuesday Terlecki: Patrina Levering Other Clinician: Referring Alixandrea Milleson: Treating Kiernan Farkas/Extender:Robson, Secundino Ginger, Hal T Weeks in Treatment: 10 Active Problems Location of Pain Severity and Description of Pain Patient Has Paino Yes Site Locations Pain Location: Pain in Ulcers Rate the pain. Current Pain Level: 6 Worst Pain Level: 9 Least Pain Level: 0 Tolerable Pain Level: 8 Character of Pain Describe the Pain: Sharp Pain Management and Medication Current Pain Management: Medication: Yes Cold Application: No Rest: Yes Massage: No Activity: No T.E.N.S.: No Heat Application: No Leg drop or elevation: No Is the Current Pain Management Adequate: Adequate How does your wound impact your activities of daily  livingo Sleep: No Bathing: No Appetite: No Relationship With Others: No Bladder Continence: No Emotions: No Bowel Continence: No Work: No Toileting: No Drive: No Dressing: No Hobbies: No Electronic Signature(s) Signed: 02/29/2020 5:19:35 PM By: Deon Pilling Entered By: Deon Pilling on 02/29/2020 09:22:09 -------------------------------------------------------------------------------- Patient/Caregiver Education Details Patient Name: Date of Service: [name] 3/19/2021andnbsp9:15 AM Medical Record [id number] Patient Account Number: [id number] Date of Birth/Gender: Mar 26, 1933 (87 y.o. F) Treating RN: Kela Millin Primary Care Physician: Patrina Levering Other Clinician: Referring Physician: Treating Physician/Extender:Robson, Secundino Ginger, Junius Creamer in Treatment: 10 Education Assessment Education Provided To: Patient Education Topics Provided Pain: Methods: Explain/Verbal Responses: State content correctly Safety: Methods: Explain/Verbal Responses: State content correctly Wound/Skin Impairment: Methods: Explain/Verbal Responses: State content correctly Electronic Signature(s) Signed: 02/29/2020 5:10:17 PM By: Kela Millin Entered By: Kela Millin on 02/29/2020 09:11:46 -------------------------------------------------------------------------------- Wound Assessment Details Patient Name: Date of Service: [name], [name] 02/29/2020 9:15 AM Medical Record [id number] Patient Account Number: [id number] Date of Birth/Sex: Treating RN: [birth year] (86 y.o. [name] Primary Care Alisa Stjames: Lajean Manes T Other Clinician: Referring Shanavia Makela: Treating Talayia Hjort/Extender:Robson, Secundino Ginger, Hal T Weeks in Treatment: 10 Wound Status Wound Number: 3 Primary Venous Leg Ulcer Etiology: Wound Location: Right Lower Leg - Lateral Wound Open Wounding Event: Gradually Appeared Status: Date Acquired: 11/19/2019 Comorbid  Cataracts, Anemia, Arrhythmia, Congestive Weeks Of Treatment: 10 History: Heart Failure, Hypertension, Peripheral Clustered Wound: No Venous Disease, Received Radiation Wound Measurements Length: (cm) 1 % Reducti Width: (cm) 0.5 % Reducti Depth: (cm) 0.2 Epithelia Area: (cm) 0.393 Tunneli Volume: (cm) 0.079 Undermi Wound Description Full Thickness Without Exposed Support Foul Odo Classification: Structures Slough/F Wound Flat and Intact Margin: Exudate Small Amount: Exudate Serous Type: Exudate amber Color: Wound Bed Granulation Amount: Large (67-100%) Granulation Quality: Red Fascia E Necrotic Amount: Small (1-33%) Fat Laye Necrotic Quality: Adherent Slough Tendon E Muscle E Joint Ex Bone Exp r After Cleansing: No ibrino Yes Exposed Structure xposed: No r (Subcutaneous Tissue) Exposed: Yes xposed:  No xposed: No posed: No osed: No on in Area: 28.5% on in Volume: -43.6% lization: Small (1-33%) ng: No ning: No Treatment Notes Wound #3 (Right, Lateral Lower Leg) 1. Cleanse With Wound Cleanser Soap and water 2. Periwound Care Moisturizing lotion TCA Cream 3. Primary Dressing Applied Endoform 4. Secondary Dressing Dry Gauze 6. Support Layer Applied 3 layer compression wrap Notes netting. Electronic Signature(s) Signed: 02/29/2020 5:19:35 PM By: Deon Pilling Entered By: Deon Pilling on 02/29/2020 09:22:45 -------------------------------------------------------------------------------- Wound Assessment Details Patient Name: Date of Service: [name], [name] 02/29/2020 9:15 AM Medical Record [id number] Patient Account Number: [id number] Date of Birth/Sex: Treating RN: 04-16-1933 (86 y.o. [name] Primary Care Lauris Keepers: Lajean Manes T Other Clinician: Referring Anddy Wingert: Treating Kaynan Klonowski/Extender:Robson, Secundino Ginger, Hal T Weeks in Treatment: 10 Wound Status Wound Number: 4 Primary Pressure Ulcer Etiology: Wound Location:  Left Foot - Medial Wound Open Wounding Event: Shear/Friction Status: Date Acquired: 01/19/2020 Comorbid Cataracts, Anemia, Arrhythmia, Congestive Weeks Of Treatment: 5 History: Heart Failure, Hypertension, Peripheral Clustered Wound: No Venous Disease, Received Radiation Wound Measurements Length: (cm) 0.8 % Reduc Width: (cm) 0.7 % Reduc Depth: (cm) 0.2 Epithel Area: (cm) 0.44 Tunnel Volume: (cm) 0.088 Underm Wound Description Classification: Category/Stage II Wound Margin: Flat and Intact Exudate Amount: Small Exudate Type: Serous Exudate Color: amber Wound Bed Granulation Amount: Small (1-33%) Granulation Quality: Pink, Pale Necrotic Amount: Large (67-100%) Necrotic Quality: Adherent Slough Foul Odor After Cleansing: No Slough/Fibrino Yes Exposed Structure Fascia Exposed: No Fat Layer (Subcutaneous Tissue) Exposed: Yes Tendon Exposed: No Muscle Exposed: No Joint Exposed: No Bone Exposed: No tion in Area: -598.4% tion in Volume: -1366.7% ialization: Small (1-33%) ing: No ining: No Treatment Notes Wound #4 (Left, Medial Foot) 1. Cleanse With Wound Cleanser Soap and water 2. Periwound Care Skin Prep 3. Primary Dressing Applied Endoform 4. Secondary Dressing Dry Gauze Foam 5. Secured With Medipore tape 6. Support Layer Applied Compression garment Notes Applied jobst farrow wrap 4000. Electronic Signature(s) Signed: 02/29/2020 5:19:35 PM By: Deon Pilling Entered By: Deon Pilling on 02/29/2020 09:23:09 -------------------------------------------------------------------------------- Vitals Details Patient Name: Date of Service: [name] 02/29/2020 9:15 AM Medical Record [id number] Patient Account Number: [id number] Date of Birth/Sex: Treating RN: 1933/10/04 (86 y.o. [name] Primary Care Kaveh Kissinger: Lajean Manes T Other Clinician: Referring Vasco Chong: Treating Lizzy Hamre/Extender:Robson, Secundino Ginger, Hal T Weeks in  Treatment: 10 Vital Signs Time Taken: 09:12 Temperature (F): 97.6 Height (in): 69 Pulse (bpm): 75 Weight (lbs): 163 Respiratory Rate (breaths/min): 18 Body Mass Index (BMI): 24.1 Blood Pressure (mmHg): 122/60 Reference Range: 80 - 120 mg / dl Electronic Signature(s) Signed: 02/29/2020 5:19:35 PM By: Deon Pilling Entered By: Deon Pilling on 02/29/2020 09:21:33

## 2020-03-07 ENCOUNTER — Encounter (HOSPITAL_BASED_OUTPATIENT_CLINIC_OR_DEPARTMENT_OTHER): Payer: Medicare Other | Admitting: Internal Medicine

## 2020-03-07 ENCOUNTER — Other Ambulatory Visit: Payer: Self-pay

## 2020-03-07 DIAGNOSIS — I87321 Chronic venous hypertension (idiopathic) with inflammation of right lower extremity: Secondary | ICD-10-CM | POA: Diagnosis not present

## 2020-03-08 NOTE — Progress Notes (Signed)
SUSEN, RANZ ([id number]) Visit Report for 03/07/2020 HPI Details Patient Name: Date of Service: [name], [name] 03/07/2020 10:00 AM Medical Record [id number] Patient Account Number: [id number] Date of Birth/Sex: Treating RN: January 13, 1933 (86 y.o. [name] Primary Care Provider: Patrina Levering Other Clinician: Referring Provider: Treating Provider/Extender:Shabre Kreher, Secundino Ginger, Hal T Weeks in Treatment: 11 History of Present Illness HPI Description: ADMISSION 12/20/2019 This is an 86 year old woman referred by her primary physician Dr. Felipa Eth for review of a wound on her right lateral lower leg. She was actually in this clinic on 2 separate occasions in 2010 and 2012 cared for by Dr. Sherilyn Cooter. At that point in time she had wounds on her right leg as well. She tells [place] to 1 month ago she noticed a scab building up on her right lateral lower leg this opened into a wound. There was no overt cause of this no trauma, no infection that she is aware of. She has a history of chronic venous insufficiency and wears compression stockings fairly religiously indeed she is done well over the last 8 years since she was last in this clinic. She has been applying Vaseline on this and a covering phone. This is not progressing towards healing. Past medical history; interstitial lung disease, chronic atrial fibrillation status post pacemaker, osteoarthritis of the left knee, left breast CA, chronic repeat venous insufficiency. She takes Eliquis for her atrial fibrillation stroke prophylaxis. ABI in our clinic was 0.99 on the right 1/15; superficial wound on the right lateral calf in the setting of severe acute skin changes from chronic venous insufficiency and lymphedema. We used Iodoflex last week she complained of a lot of pain 1/22; this is a small but difficult wound on the right lateral calf in the setting of severe chronic venous insufficiency and secondary lymphedema. She  continues to state the wounds things hurts when she is up on it but seems to be relieved by putting her leg up. I changed her to [facility] last week because the Iodoflex seem to be causing stinging. 1/29. This wound appears to be contracting somewhat. Changes of chronic venous insufficiency with secondary lymphedema 2/12; not as good as surface today and slightly bigger. I had to change her from Iodoflex to [facility] because of the stinging pain although she does not think it was any better on the [facility]. She comes into clinic today with a area on the medial left great toe. She said she noticed blood on her sock last Saturday. She had some form of bunion surgery by Dr. Ila Mcgill her podiatrist sometime in 2019 she said he "shave the area". I could not find his operative report although I did see reference to the bunion in the area. 2/19; somewhat improved wound on the right lateral lower leg however the wound she came in on the bunion of her left great toe is actually I think larger still somewhat inflamed. We have been using Iodoflex 2/26; not much improvement in either wound area which is the original venous wound on the right lateral lower leg and in the area on the bunion of her left great toe. This still looks somewhat inflamed and tender. We have been using Iodoflex with open much improvement. 3/4; in general both her wounds look better this includes the original venous insufficiency wound on the right lateral lower leg and the area on her tip of her bunion on the left great toe medial aspect of the MTP. Change to [facility] last  week 3/12; the patient still has a small geographic shaped wound on the right lateral lower leg. We have been using Hydrofera Blue but I changed her to endoform today. She also has the area in the tip of the bunion of the left great toe. We will try Hydrofera Blue here as well 3/19; the small geographic wound on the right lateral lower leg  has not filled in. There is some surrounding induration we have noticed this previously. This may be venous inflammation but I wonder about biopsying this if this is not closing up. The area over the left medial first MTP [bunion deformity] requires debridement. This is not closing in. I am not convinced she is offloading adequately 3/26; right lateral lower leg in the setting of severe venous insufficiency and also an area over the first MTP bunion deformity. No debridement is required. We have been using endoform Electronic Signature(s) Signed: 03/08/2020 8:02:09 AM By: Linton Ham MD Entered By: Linton Ham on 03/07/2020 11:25:52 -------------------------------------------------------------------------------- Physical Exam Details Patient Name: Date of Service: [name] 03/07/2020 10:00 AM Medical Record [id number] Patient Account Number: [id number] Date of Birth/Sex: Treating RN: 12/04/1933 (86 y.o. [name] Primary Care Provider: Patrina Levering Other Clinician: Referring Provider: Treating Provider/Extender:Keasha Malkiewicz, Secundino Ginger, Hal T Weeks in Treatment: 11 Constitutional Patient is hypertensive.. Pulse regular and within target range for patient.Marland Kitchen Respirations regular, non-labored and within target range.. Temperature is normal and within the target range for the patient.Marland Kitchen Appears in no distress. Cardiovascular Pedal pulses palpable and strong bilaterally.. Notes Wound exam; The area that we have been following on the right lateral lower leg looks somewhat better. There is some eschar I elected not to debride this. It looks like it is filling in Similarly the area on the left first MTP looks better. The patient has marked innumerable dilated small veins on her feet indicative of severe venous hypertension Electronic Signature(s) Signed: 03/08/2020 8:02:09 AM By: Linton Ham MD Entered By: Linton Ham on 03/07/2020  11:27:22 -------------------------------------------------------------------------------- Physician Orders Details Patient Name: Date of Service: [name] 03/07/2020 10:00 AM Medical Record [id number] Patient Account Number: [id number] Date of Birth/Sex: Treating RN: Sep 12, 1933 (86 y.o. [name] Primary Care Provider: Lajean Manes T Other Clinician: Referring Provider: Treating Provider/Extender:Eilidh Marcano, Secundino Ginger, Junius Creamer in Treatment: 34 Verbal / Phone Orders: No Diagnosis Coding ICD-10 Coding Code Description I87.321 Chronic venous hypertension (idiopathic) with inflammation of right lower extremity L97.812 Non-pressure chronic ulcer of other part of right lower leg with fat layer exposed I87.322 Chronic venous hypertension (idiopathic) with inflammation of left lower extremity L97.522 Non-pressure chronic ulcer of other part of left foot with fat layer exposed Follow-up Appointments Return Appointment in 1 week. - Nurse Visit Return Appointment in 2 weeks. - MD visit, Friday Dressing Change Frequency Wound #3 Right,Lateral Lower Leg Do not change entire dressing for one week. Wound #4 Left,Medial Foot Change Dressing every other day. - only to left medial foot wound Skin Barriers/Peri-Wound Care Moisturizing lotion TCA Cream or Ointment - mixed with lotion to leg, TCA over wound bed as well Wound Cleansing May shower with protection. - cast protector Primary Wound Dressing Wound #3 Right,Lateral Lower Leg Endoform - moisten with saline Wound #4 Left,Medial Foot Endoform - moisten with saline Secondary Dressing Wound #3 Right,Lateral Lower Leg Dry Gauze Wound #4 Left,Medial Foot Foam - or tape Dry Gauze Edema Control 3 Layer Compression System - Right Lower Extremity Avoid standing for long periods of time Elevate legs  to the level of the heart or above for 30 minutes daily and/or when sitting, a frequency of: - throughout the  day. Exercise regularly Support Garment 30-40 mm/Hg pressure to: - farrow wrap 4000 for to left leg Off-Loading Open toe surgical shoe to: - left Electronic Signature(s) Signed: 03/07/2020 5:41:24 PM By: Kela Millin Signed: 03/08/2020 8:02:09 AM By: Linton Ham MD Entered By: Kela Millin on 03/07/2020 10:49:48 -------------------------------------------------------------------------------- Problem List Details Patient Name: Date of Service: [name]. 03/07/2020 10:00 AM Medical Record [id number] Patient Account Number: [id number] Date of Birth/Sex: Treating RN: June 01, 1933 (86 y.o. [name] Primary Care Provider: Lajean Manes T Other Clinician: Referring Provider: Treating Provider/Extender:Linnie Delgrande, Secundino Ginger, Junius Creamer in Treatment: 11 Active Problems ICD-10 Evaluated Encounter Code Description Active Date Today Diagnosis I87.321 Chronic venous hypertension (idiopathic) with 12/20/2019 No Yes inflammation of right lower extremity L97.812 Non-pressure chronic ulcer of other part of right lower 12/20/2019 No Yes leg with fat layer exposed I87.322 Chronic venous hypertension (idiopathic) with 02/14/2020 No Yes inflammation of left lower extremity L97.522 Non-pressure chronic ulcer of other part of left foot 01/25/2020 No Yes with fat layer exposed Inactive Problems Resolved Problems Electronic Signature(s) Signed: 03/08/2020 8:02:09 AM By: Linton Ham MD Entered By: Linton Ham on 03/07/2020 11:23:09 -------------------------------------------------------------------------------- Progress Note Details Patient Name: Date of Service: [name] 03/07/2020 10:00 AM Medical Record [id number] Patient Account Number: [id number] Date of Birth/Sex: Treating RN: 01-30-1933 (86 y.o. [name] Primary Care Provider: Patrina Levering Other Clinician: Referring Provider: Treating Provider/Extender:Tamarius Rosenfield,  Secundino Ginger, Hal T Weeks in Treatment: 11 Subjective History of Present Illness (HPI) ADMISSION 12/20/2019 This is an 86 year old woman referred by her primary physician Dr. Felipa Eth for review of a wound on her right lateral lower leg. She was actually in this clinic on 2 separate occasions in 2010 and 2012 cared for by Dr. Sherilyn Cooter. At that point in time she had wounds on her right leg as well. She tells [place] to 1 month ago she noticed a scab building up on her right lateral lower leg this opened into a wound. There was no overt cause of this no trauma, no infection that she is aware of. She has a history of chronic venous insufficiency and wears compression stockings fairly religiously indeed she is done well over the last 8 years since she was last in this clinic. She has been applying Vaseline on this and a covering phone. This is not progressing towards healing. Past medical history; interstitial lung disease, chronic atrial fibrillation status post pacemaker, osteoarthritis of the left knee, left breast CA, chronic repeat venous insufficiency. She takes Eliquis for her atrial fibrillation stroke prophylaxis. ABI in our clinic was 0.99 on the right 1/15; superficial wound on the right lateral calf in the setting of severe acute skin changes from chronic venous insufficiency and lymphedema. We used Iodoflex last week she complained of a lot of pain 1/22; this is a small but difficult wound on the right lateral calf in the setting of severe chronic venous insufficiency and secondary lymphedema. She continues to state the wounds things hurts when she is up on it but seems to be relieved by putting her leg up. I changed her to [facility] last week because the Iodoflex seem to be causing stinging. 1/29. This wound appears to be contracting somewhat. Changes of chronic venous insufficiency with secondary lymphedema 2/12; not as good as surface today and slightly bigger. I had to  change her from Iodoflex to  Hydrofera Blue because of the stinging pain although she does not think it was any better on the [facility]. ooShe comes into clinic today with a area on the medial left great toe. She said she noticed blood on her sock last Saturday. She had some form of bunion surgery by Dr. Ila Mcgill her podiatrist sometime in 2019 she said he "shave the area". I could not find his operative report although I did see reference to the bunion in the area. 2/19; somewhat improved wound on the right lateral lower leg however the wound she came in on the bunion of her left great toe is actually I think larger still somewhat inflamed. We have been using Iodoflex 2/26; not much improvement in either wound area which is the original venous wound on the right lateral lower leg and in the area on the bunion of her left great toe. This still looks somewhat inflamed and tender. We have been using Iodoflex with open much improvement. 3/4; in general both her wounds look better this includes the original venous insufficiency wound on the right lateral lower leg and the area on her tip of her bunion on the left great toe medial aspect of the MTP. Change to Hydrofera Blue last week 3/12; the patient still has a small geographic shaped wound on the right lateral lower leg. We have been using Hydrofera Blue but I changed her to endoform today. She also has the area in the tip of the bunion of the left great toe. We will try Hydrofera Blue here as well 3/19; the small geographic wound on the right lateral lower leg has not filled in. There is some surrounding induration we have noticed this previously. This may be venous inflammation but I wonder about biopsying this if this is not closing up. The area over the left medial first MTP [bunion deformity] requires debridement. This is not closing in. I am not convinced she is offloading adequately 3/26; right lateral lower leg in the setting of  severe venous insufficiency and also an area over the first MTP bunion deformity. No debridement is required. We have been using endoform Objective Constitutional Patient is hypertensive.. Pulse regular and within target range for patient.Marland Kitchen Respirations regular, non-labored and within target range.. Temperature is normal and within the target range for the patient.Marland Kitchen Appears in no distress. Vitals Time Taken: 10:16 AM, Height: 69 in, Source: Stated, Weight: 163 lbs, Source: Stated, BMI: 24.1, Temperature: 98.0 F, Pulse: 77 bpm, Respiratory Rate: 18 breaths/min, Blood Pressure: 143/79 mmHg. Cardiovascular Pedal pulses palpable and strong bilaterally.. General Notes: Wound exam; ooThe area that we have been following on the right lateral lower leg looks somewhat better. There is some eschar I elected not to debride this. It looks like it is filling in ooSimilarly the area on the left first MTP looks better. ooThe patient has marked innumerable dilated small veins on her feet indicative of severe venous hypertension Integumentary (Hair, Skin) Wound #3 status is Open. Original cause of wound was Gradually Appeared. The wound is located on the Right,Lateral Lower Leg. The wound measures 0.8cm length x 0.6cm width x 0.1cm depth; 0.377cm^2 area and 0.038cm^3 volume. There is Fat Layer (Subcutaneous Tissue) Exposed exposed. There is no tunneling or undermining noted. There is a small amount of serous drainage noted. The wound margin is flat and intact. There is no granulation within the wound bed. There is no necrotic tissue within the wound bed. General Notes: adherent scab over wound bed Wound #4  status is Open. Original cause of wound was Shear/Friction. The wound is located on the Left,Medial Foot. The wound measures 0.2cm length x 0.4cm width x 0.1cm depth; 0.063cm^2 area and 0.006cm^3 volume. There is Fat Layer (Subcutaneous Tissue) Exposed exposed. There is no tunneling or undermining  noted. There is a small amount of serous drainage noted. The wound margin is flat and intact. There is large (67-100%) pink, pale granulation within the wound bed. There is no necrotic tissue within the wound bed. Assessment Active Problems ICD-10 Chronic venous hypertension (idiopathic) with inflammation of right lower extremity Non-pressure chronic ulcer of other part of right lower leg with fat layer exposed Chronic venous hypertension (idiopathic) with inflammation of left lower extremity Non-pressure chronic ulcer of other part of left foot with fat layer exposed Procedures Wound #3 Pre-procedure diagnosis of Wound #3 is a Venous Leg Ulcer located on the Right,Lateral Lower Leg . There was a Three Layer Compression Therapy Procedure by Deon Pilling, RN. Post procedure Diagnosis Wound #3: Same as Pre-Procedure Plan Follow-up Appointments: Return Appointment in 1 week. - Nurse Visit Return Appointment in 2 weeks. - MD visit, Friday Dressing Change Frequency: Wound #3 Right,Lateral Lower Leg: Do not change entire dressing for one week. Wound #4 Left,Medial Foot: Change Dressing every other day. - only to left medial foot wound Skin Barriers/Peri-Wound Care: Moisturizing lotion TCA Cream or Ointment - mixed with lotion to leg, TCA over wound bed as well Wound Cleansing: May shower with protection. - cast protector Primary Wound Dressing: Wound #3 Right,Lateral Lower Leg: Endoform - moisten with saline Wound #4 Left,Medial Foot: Endoform - moisten with saline Secondary Dressing: Wound #3 Right,Lateral Lower Leg: Dry Gauze Wound #4 Left,Medial Foot: Foam - or tape Dry Gauze Edema Control: 3 Layer Compression System - Right Lower Extremity Avoid standing for long periods of time Elevate legs to the level of the heart or above for 30 minutes daily and/or when sitting, a frequency of: - throughout the day. Exercise regularly Support Garment 30-40 mm/Hg pressure to: - farrow  wrap 4000 for to left leg Off-Loading: Open toe surgical shoe to: - left 1. I continued with Hydrofera Blue, TCA to the periwound on the right under 3 layer compression 2. She is changing the dressing herself to the bunion wound. 3. She is religiously offloading 4. Could consider ordering venous reflux studies Electronic Signature(s) Signed: 03/08/2020 8:02:09 AM By: Linton Ham MD Entered By: Linton Ham on 03/07/2020 11:28:20 -------------------------------------------------------------------------------- SuperBill Details Patient Name: Date of Service: [name] 03/07/2020 Medical Record [id number] Patient Account Number: [id number] Date of Birth/Sex: Treating RN: 02-05-1933 (86 y.o. [name] Primary Care Provider: Lajean Manes T Other Clinician: Referring Provider: Treating Provider/Extender:Emmanual Gauthreaux, Secundino Ginger, Hal T Weeks in Treatment: 11 Diagnosis Coding ICD-10 Codes Code Description I87.321 Chronic venous hypertension (idiopathic) with inflammation of right lower extremity L97.812 Non-pressure chronic ulcer of other part of right lower leg with fat layer exposed I87.322 Chronic venous hypertension (idiopathic) with inflammation of left lower extremity L97.522 Non-pressure chronic ulcer of other part of left foot with fat layer exposed Facility Procedures The patient participates with Medicare or their insurance follows the Medicare Facility Guidelines: CPT4 Code Description Modifier Quantity [id number] (Facility Use Only) [phone] - [place] [facility] LEG 1 Physician Procedures CPT4 Code Description: [id number] - WC PHYS LEVEL 3 - EST PT ICD-10 Diagnosis Description I87.321 Chronic venous hypertension (idiopathic) with inflammatio extremity L97.812 Non-pressure chronic ulcer of other part of right lower l L97.522  Non-pressure chronic ulcer of other part of left foot wit Modifier: n of right lower eg with fat laye h fat layer  expo Quantity: 1 r exposed sed Electronic Signature(s) Signed: 03/08/2020 8:02:09 AM By: Linton Ham MD Entered By: Linton Ham on 03/07/2020 11:28:45

## 2020-03-08 NOTE — Progress Notes (Signed)
[name], Tracy J. ([id number]) Visit Report for 03/07/2020 Arrival Information Details Patient Name: Date of Service: [name], Tracy J. 03/07/2020 10:00 AM Medical Record Number:[id number] Patient Account Number: [id number] Date of Birth/Sex: Treating RN: 05/14/1933 (86 y.o. F) Boehlein, Linda Primary Care Provider: Stoneking, Hal T Other Clinician: Referring Provider: Treating Provider/Extender:Robson, Michael Stoneking, Hal T Weeks in Treatment: 11 Visit Information History Since Last Visit Added or deleted any medications: Yes Patient Arrived: Ambulatory Any new allergies or adverse reactions: No Arrival Time: 10:08 Had a fall or experienced change in No Accompanied By: self activities of daily living that may affect Transfer Assistance: None risk of falls: Patient Identification Verified: Yes Signs or symptoms of abuse/neglect since last No Secondary Verification Process Yes visito Completed: Hospitalized since last visit: No Patient Requires Transmission-Based No Implantable device outside of the clinic excluding No Precautions: cellular tissue based products placed in the center Patient Has Alerts: Yes since last visit: Patient Alerts: Patient on Blood Has Dressing in Place as Prescribed: Yes Thinner Has Compression in Place as Prescribed: Yes Right ABI:0.99 Pain Present Now: No Electronic Signature(s) Signed: 03/07/2020 5:34:10 PM By: Boehlein, Linda RN, BSN Entered By: Boehlein, Linda on 03/07/2020 10:16:39 -------------------------------------------------------------------------------- Compression Therapy Details Patient Name: Date of Service: [name], Tracy J. 03/07/2020 10:00 AM Medical Record Number:[id number] Patient Account Number: [id number] Date of Birth/Sex: Treating RN: 09/01/1933 (86 y.o. F) Dwiggins, Shannon Primary Care Provider: Stoneking, Hal T Other Clinician: Referring Provider: Treating Provider/Extender:Robson, Michael Stoneking, Hal T Weeks in  Treatment: 11 Compression Therapy Performed for Wound Wound #3 Right,Lateral Lower Leg Assessment: Performed By: Clinician Deaton, Bobbi, RN Compression Type: Three Layer Post Procedure Diagnosis Same as Pre-procedure Electronic Signature(s) Signed: 03/07/2020 5:41:24 PM By: Dwiggins, Shannon Entered By: Dwiggins, Shannon on 03/07/2020 10:44:43 -------------------------------------------------------------------------------- Lower Extremity Assessment Details Patient Name: Date of Service: [name], Tracy J. 03/07/2020 10:00 AM Medical Record Number:[id number] Patient Account Number: [id number] Date of Birth/Sex: Treating RN: 07/01/1933 (86 y.o. F) Boehlein, Linda Primary Care Provider: Stoneking, Hal T Other Clinician: Referring Provider: Treating Provider/Extender:Robson, Michael Stoneking, Hal T Weeks in Treatment: 11 Edema Assessment Assessed: [Left: No] [Right: No] Edema: [Left: No] [Right: No] Calf Left: Right: Point of Measurement: 42 cm From Medial Instep 32.8 cm 31.1 cm Ankle Left: Right: Point of Measurement: 13 cm From Medial Instep 20.1 cm 19.4 cm Vascular Assessment Pulses: Dorsalis Pedis Palpable: [Left:Yes] [Right:Yes] Electronic Signature(s) Signed: 03/07/2020 5:34:10 PM By: Boehlein, Linda RN, BSN Entered By: Boehlein, Linda on 03/07/2020 10:28:05 -------------------------------------------------------------------------------- Multi Wound Chart Details Patient Name: Date of Service: [name], Tracy J. 03/07/2020 10:00 AM Medical Record Number:[id number] Patient Account Number: [id number] Date of Birth/Sex: Treating RN: 07/27/1933 (86 y.o. F) Dwiggins, Shannon Primary Care Provider: Stoneking, Hal T Other Clinician: Referring Provider: Treating Provider/Extender:Robson, Michael Stoneking, Hal T Weeks in Treatment: 11 Vital Signs Height(in): 69 Pulse(bpm): 77 Weight(lbs): 163 Blood Pressure(mmHg): 143/79 Body Mass Index(BMI): 24 Temperature(°F):  98.0 Respiratory 18 Rate(breaths/min): Photos: [3:No Photos] [4:No Photos] [N/A:N/A] Wound Location: [3:Right Lower Leg - Lateral Left Foot - Medial] [N/A:N/A] Wounding Event: [3:Gradually Appeared] [4:Shear/Friction] [N/A:N/A] Primary Etiology: [3:Venous Leg Ulcer] [4:Pressure Ulcer] [N/A:N/A] Comorbid History: [3:Cataracts, Anemia, Arrhythmia, Congestive Heart Failure, Hypertension, Heart Failure, Hypertension, Peripheral Venous Disease, Peripheral Venous Disease, Received Radiation] [4:Cataracts, Anemia, Arrhythmia, Congestive Received  Radiation] [N/A:N/A] Date Acquired: [3:11/19/2019] [4:01/19/2020] [N/A:N/A] Weeks of Treatment: [3:11] [4:6] [N/A:N/A] Wound Status: [3:Open] [4:Open] [N/A:N/A] Measurements L x W x D 0.8x0.6x0.1 [4:0.2x0.4x0.1] [N/A:N/A] (cm) Area (cm) : [3:0.377] [4:0.063] [N/A:N/A] Volume (cm) : [3:0.038] [4:0.006] [N/A:N/A] % Reduction in Area: [  3:31.50%] [4:0.00%] [N/A:N/A] % Reduction in Volume: 30.90% [4:0.00%] [N/A:N/A] Classification: [3:Full Thickness Without Exposed Support Structures] [4:Category/Stage II] [N/A:N/A] Exudate Amount: [3:Small] [4:Small] [N/A:N/A] Exudate Type: [3:Serous] [4:Serous] [N/A:N/A] Exudate Color: [3:amber] [4:amber] [N/A:N/A] Wound Margin: [3:Flat and Intact] [4:Flat and Intact] [N/A:N/A] Granulation Amount: [3:None Present (0%)] [4:Large (67-100%)] [N/A:N/A] Granulation Quality: [3:N/A] [4:Pink, Pale] [N/A:N/A] Necrotic Amount: [3:None Present (0%)] [4:None Present (0%)] [N/A:N/A] Exposed Structures: [3:Fat Layer (Subcutaneous Fat Layer (Subcutaneous N/A Tissue) Exposed: Yes Fascia: No Tendon: No Muscle: No Joint: No Bone: No] [4:Tissue) Exposed: Yes Fascia: No Tendon: No Muscle: No Joint: No Bone: No] Epithelialization: [3:Small (1-33%)] [4:Small (1-33%)] [N/A:N/A] Assessment Notes: [3:adherent scab over wound N/A bed] [4:N/A] [N/A:N/A N/A] Treatment Notes Electronic Signature(s) Signed: 03/07/2020 5:41:24 PM By: Dwiggins,  Shannon Signed: 03/08/2020 8:02:09 AM By: Robson, Michael MD Entered By: Robson, Michael on 03/07/2020 11:23:19 -------------------------------------------------------------------------------- Multi-Disciplinary Care Plan Details Patient Name: Date of Service: [name], Tracy J. 03/07/2020 10:00 AM Medical Record Number:[id number] Patient Account Number: [id number] Date of Birth/Sex: Treating RN: 07/25/1933 (87 y.o. F) Dwiggins, Shannon Primary Care Provider: Stoneking, Hal T Other Clinician: Referring Provider: Treating Provider/Extender:Robson, Michael Stoneking, Hal T Weeks in Treatment: 11 Active Inactive Abuse / Safety / Falls / Self Care Management Nursing Diagnoses: Potential for falls Goals: Patient/caregiver will verbalize understanding of skin care regimen Date Initiated: 12/20/2019 Date Inactivated: 02/14/2020 Target Resolution Date: 02/22/2020 Goal Status: Met Patient/caregiver will verbalize/demonstrate understanding of what to do in case of emergency Date Initiated: 12/20/2019 Target Resolution Date: 03/28/2020 Goal Status: Active Interventions: Assess fall risk on admission and as needed Provide education on fall prevention Notes: Pain, Acute or Chronic Nursing Diagnoses: Pain, acute or chronic: actual or potential Potential alteration in comfort, pain Goals: Patient will verbalize adequate pain control and receive pain control interventions during procedures as needed Date Initiated: 12/20/2019 Date Inactivated: 02/14/2020 Target Resolution Date: 02/22/2020 Goal Status: Met Patient/caregiver will verbalize comfort level met Date Initiated: 12/20/2019 Target Resolution Date: 03/14/2020 Goal Status: Active Interventions: Complete pain assessment as per visit requirements Provide education on pain management Notes: Wound/Skin Impairment Nursing Diagnoses: Knowledge deficit related to ulceration/compromised skin integrity Goals: Patient/caregiver will verbalize  understanding of skin care regimen Date Initiated: 12/20/2019 Target Resolution Date: 03/21/2020 Goal Status: Active Interventions: Assess patient/caregiver ability to perform ulcer/skin care regimen upon admission and as needed Provide education on ulcer and skin care Treatment Activities: Skin care regimen initiated : 12/20/2019 Topical wound management initiated : 12/20/2019 Notes: Electronic Signature(s) Signed: 03/07/2020 5:41:24 PM By: Dwiggins, Shannon Entered By: Dwiggins, Shannon on 03/07/2020 10:21:50 -------------------------------------------------------------------------------- Pain Assessment Details Patient Name: Date of Service: [name], Tracy J. 03/07/2020 10:00 AM Medical Record Number:[id number] Patient Account Number: [id number] Date of Birth/Sex: Treating RN: 08/15/1933 (87 y.o. F) Boehlein, Linda Primary Care Provider: Stoneking, Hal T Other Clinician: Referring Provider: Treating Provider/Extender:Robson, Michael Stoneking, Hal T Weeks in Treatment: 11 Active Problems Location of Pain Severity and Description of Pain Patient Has Paino No Site Locations Rate the pain. Current Pain Level: 0 Pain Management and Medication Current Pain Management: Electronic Signature(s) Signed: 03/07/2020 5:34:10 PM By: Boehlein, Linda RN, BSN Entered By: Boehlein, Linda on 03/07/2020 10:18:50 -------------------------------------------------------------------------------- Patient/Caregiver Education Details Patient Name: Date of Service: [name], Tracy J. 3/26/2021andnbsp10:00 AM Medical Record Number:[id number] Patient Account Number: [id number] Date of Birth/Gender: 12/24/1932 (87 y.o. F) Treating RN: Dwiggins, Shannon Primary Care Physician: Stoneking, Hal T Other Clinician: Referring Physician: Treating Physician/Extender:Robson, Michael Stoneking, Hal T Weeks in Treatment: 11 Education Assessment Education Provided To: Patient Education Topics Provided Pain: Methods:    Explain/Verbal Responses: State content correctly Safety: Methods: Explain/Verbal Responses: State content correctly Wound/Skin Impairment: Methods: Explain/Verbal Responses: State content correctly Electronic Signature(s) Signed: 03/07/2020 5:41:24 PM By: Kela Millin Entered By: Kela Millin on 03/07/2020 10:22:11 -------------------------------------------------------------------------------- Wound Assessment Details Patient Name: Date of Service: [name] 03/07/2020 10:00 AM Medical Record [id number] Patient Account Number: [id number] Date of Birth/Sex: Treating RN: 14-Dec-1932 (86 y.o. [name] Primary Care Ferron Ishmael: Lajean Manes T Other Clinician: Referring Jonea Bukowski: Treating Lenzie Montesano/Extender:Robson, Secundino Ginger, Hal T Weeks in Treatment: 11 Wound Status Wound Number: 3 Primary Venous Leg Ulcer Etiology: Wound Location: Right Lower Leg - Lateral Wound Open Wounding Event: Gradually Appeared Status: Date Acquired: 11/19/2019 Comorbid Cataracts, Anemia, Arrhythmia, Congestive Weeks Of Treatment: 11 History: Heart Failure, Hypertension, Peripheral Clustered Wound: No Venous Disease, Received Radiation Wound Measurements Length: (cm) 0.8 % Reduc Width: (cm) 0.6 % Reduc Depth: (cm) 0.1 Epithel Area: (cm) 0.377 Tunnel Volume: (cm) 0.038 Underm Wound Description Full Thickness Without Exposed Support Foul Od Classification: Structures Slough/ Wound Flat and Intact Margin: Exudate Small Amount: Exudate Serous Type: Exudate amber Color: Wound Bed Granulation Amount: None Present (0%) Necrotic Amount: None Present (0%) Fascia Fat Lay Tendon Muscle Joint E Bone Ex or After Cleansing: No Fibrino No Exposed Structure Exposed: No er (Subcutaneous Tissue) Exposed: Yes Exposed: No Exposed: No xposed: No posed: No tion in Area: 31.5% tion in Volume: 30.9% ialization: Small (1-33%) ing: No ining: No Assessment  Notes adherent scab over wound bed Electronic Signature(s) Signed: 03/07/2020 5:34:10 PM By: [name] RN, BSN Entered By: [name] on 03/07/2020 10:31:17 -------------------------------------------------------------------------------- Wound Assessment Details Patient Name: Date of Service: [name] 03/07/2020 10:00 AM Medical Record [id number] Patient Account Number: [id number] Date of Birth/Sex: Treating RN: 02-Aug-1933 (86 y.o. [name] Primary Care Ileta Ofarrell: Lajean Manes T Other Clinician: Referring Derra Shartzer: Treating Imoni Kohen/Extender:Robson, Secundino Ginger, Hal T Weeks in Treatment: 11 Wound Status Wound Number: 4 Primary Pressure Ulcer Etiology: Wound Location: Left Foot - Medial Wound Open Wounding Event: Shear/Friction Status: Date Acquired: 01/19/2020 Comorbid Cataracts, Anemia, Arrhythmia, Congestive Weeks Of Treatment: 6 History: Heart Failure, Hypertension, Peripheral Clustered Wound: No Venous Disease, Received Radiation Wound Measurements Length: (cm) 0.2 % Reductio Width: (cm) 0.4 % Reductio Depth: (cm) 0.1 Epithelial Area: (cm) 0.063 Tunneling Volume: (cm) 0.006 Undermini Wound Description Classification: Category/Stage II Wound Margin: Flat and Intact Exudate Amount: Small Exudate Type: Serous Exudate Color: amber Wound Bed Granulation Amount: Large (67-100%) Granulation Quality: Pink, Pale Necrotic Amount: None Present (0%) Foul Odor After Cleansing: No Slough/Fibrino Yes Exposed Structure Fascia Exposed: No Fat Layer (Subcutaneous Tissue) Exposed: Yes Tendon Exposed: No Muscle Exposed: No Joint Exposed: No Bone Exposed: No n in Area: 0% n in Volume: 0% ization: Small (1-33%) : No ng: No Electronic Signature(s) Signed: 03/07/2020 5:34:10 PM By: [name] RN, BSN Entered By: [name] on 03/07/2020  10:31:38 -------------------------------------------------------------------------------- [place] Details Patient Name: Date of Service: [name] 03/07/2020 10:00 AM Medical Record [id number] Patient Account Number: [id number] Date of Birth/Sex: Treating RN: [birth year] (86 y.o. [name] Primary Care Edy Belt: Lajean Manes T Other Clinician: Referring Jaikob Borgwardt: Treating Wilfred Siverson/Extender:Robson, Secundino Ginger, Hal T Weeks in Treatment: 11 Vital Signs Time Taken: 10:16 Temperature (F): 98.0 Height (in): 69 Pulse (bpm): 77 Source: Stated Respiratory Rate (breaths/min): 18 Weight (lbs): 163 Blood Pressure (mmHg): 143/79 Source: Stated Reference Range: 80 - 120 mg / dl Body Mass Index (BMI): 24.1 Electronic Signature(s) Signed: 03/07/2020 5:34:10 PM By: [name] RN, BSN Entered By: Johna Roles,  Linda on 03/07/2020 10:17:38 

## 2020-03-11 ENCOUNTER — Encounter: Payer: Self-pay | Admitting: Hematology and Oncology

## 2020-03-14 ENCOUNTER — Encounter (HOSPITAL_BASED_OUTPATIENT_CLINIC_OR_DEPARTMENT_OTHER): Payer: Medicare Other | Attending: Internal Medicine | Admitting: Internal Medicine

## 2020-03-14 ENCOUNTER — Other Ambulatory Visit: Payer: Self-pay

## 2020-03-14 DIAGNOSIS — I87321 Chronic venous hypertension (idiopathic) with inflammation of right lower extremity: Secondary | ICD-10-CM | POA: Insufficient documentation

## 2020-03-14 DIAGNOSIS — Z950 Presence of cardiac pacemaker: Secondary | ICD-10-CM | POA: Insufficient documentation

## 2020-03-14 DIAGNOSIS — L97522 Non-pressure chronic ulcer of other part of left foot with fat layer exposed: Secondary | ICD-10-CM | POA: Diagnosis not present

## 2020-03-14 DIAGNOSIS — J849 Interstitial pulmonary disease, unspecified: Secondary | ICD-10-CM | POA: Diagnosis not present

## 2020-03-14 DIAGNOSIS — I482 Chronic atrial fibrillation, unspecified: Secondary | ICD-10-CM | POA: Insufficient documentation

## 2020-03-14 DIAGNOSIS — I87322 Chronic venous hypertension (idiopathic) with inflammation of left lower extremity: Secondary | ICD-10-CM | POA: Insufficient documentation

## 2020-03-14 DIAGNOSIS — L97812 Non-pressure chronic ulcer of other part of right lower leg with fat layer exposed: Secondary | ICD-10-CM | POA: Diagnosis present

## 2020-03-14 DIAGNOSIS — Z7901 Long term (current) use of anticoagulants: Secondary | ICD-10-CM | POA: Diagnosis not present

## 2020-03-14 NOTE — Progress Notes (Signed)
[name], [name] ([id number]) Visit Report for 03/14/2020 Arrival Information Details Patient Name: Date of Service: Tracy, Green 03/14/2020 9:00 AM Medical Record [id number] Patient Account Number: [id number] Date of Birth/Sex: Treating RN: July 16, 1933 (86 y.o. [name] Primary Care Ariday Brinker: Lajean Manes T Other Clinician: Referring Adeola Dennen: Treating Yarenis Cerino/Extender:Robson, Secundino Ginger, Junius Creamer in Treatment: 12 Visit Information History Since Last Visit All ordered tests and consults were completed: No Patient Arrived: Ambulatory Added or deleted any medications: No Arrival Time: 08:58 Any new allergies or adverse reactions: No Accompanied By: self Had a fall or experienced change in No Transfer Assistance: None activities of daily living that may affect Patient Identification Verified: Yes risk of falls: Secondary Verification Process Yes Signs or symptoms of abuse/neglect since last No Completed: visito Patient Requires Transmission- No Hospitalized since last visit: No Based Precautions: Implantable device outside of the clinic excluding No Patient Has Alerts: Yes cellular tissue based products placed in the center Patient Alerts: Patient on Blood since last visit: Thinner Has Dressing in Place as Prescribed: Yes Right ABI:0.99 Has Compression in Place as Prescribed: Yes Pain Present Now: No Electronic Signature(s) Signed: 03/14/2020 4:56:09 PM By: Carlene Coria RN Entered By: Carlene Coria on 03/14/2020 09:01:42 -------------------------------------------------------------------------------- Compression Therapy Details Patient Name: Date of Service: [name] 03/14/2020 9:00 AM Medical Record [id number] Patient Account Number: [id number] Date of Birth/Sex: Treating RN: 1933/08/03 (86 y.o. [name] Primary Care Pacey Altizer: Lajean Manes T Other Clinician: Referring Elizabth Palka: Treating Ernestyne Caldwell/Extender:Robson,  Secundino Ginger, Hal T Weeks in Treatment: 12 Compression Therapy Performed for Wound Wound #3 Right,Lateral Lower Leg Assessment: Performed By: Clinician [name], RN Compression Type: Three Hydrologist) Signed: 03/14/2020 9:44:53 AM By: [name] RN, BSN Entered By: [name] on 03/14/2020 09:32:49 -------------------------------------------------------------------------------- Encounter Discharge Information Details Patient Name: Date of Service: [name] 03/14/2020 9:00 AM Medical Record [id number] Patient Account Number: [id number] Date of Birth/Sex: Treating RN: [birth year] (86 y.o. [name] Primary Care Rilee Knoll: Lajean Manes T Other Clinician: Referring Aanchal Cope: Treating Saron Tweed/Extender:Robson, Secundino Ginger, Junius Creamer in Treatment: 12 Encounter Discharge Information Items Discharge Condition: Stable Ambulatory Status: Ambulatory Discharge Destination: Home Transportation: Private Auto Accompanied By: self Schedule Follow-up Appointment: Yes Clinical Summary of Care: Patient Declined Electronic Signature(s) Signed: 03/14/2020 9:44:53 AM By: [name] RN, BSN Entered By: [name] on 03/14/2020 09:34:54 -------------------------------------------------------------------------------- Patient/Caregiver Education Details Patient Name: Date of Service: [name], Tracy J. 4/2/2021andnbsp9:00 AM Medical Record [id number] Patient Account Number: [id number] Date of Birth/Gender: 1933-11-25 (87 y.o. F) Treating RN: [name] Primary Care Physician: Patrina Levering Other Clinician: Referring Physician: Treating Physician/Extender:Robson, Secundino Ginger, Junius Creamer in Treatment: 12 Education Assessment Education Provided To: Patient Education Topics Provided Wound/Skin Impairment: Methods: Explain/Verbal Responses: Reinforcements needed, State content correctly Electronic  Signature(s) Signed: 03/14/2020 9:44:53 AM By: [name] RN, BSN Entered By: [name] on 03/14/2020 09:34:40 -------------------------------------------------------------------------------- Wound Assessment Details Patient Name: Date of Service: [name] 03/14/2020 9:00 AM Medical Record [id number] Patient Account Number: [id number] Date of Birth/Sex: Treating RN: 02/28/1933 (86 y.o. [name] Primary Care Dior Stepter: Lajean Manes T Other Clinician: Referring Aella Ronda: Treating Essie Gehret/Extender:Robson, Secundino Ginger, Hal T Weeks in Treatment: 12 Wound Status Wound Number: 3 Primary Venous Leg Ulcer Etiology: Wound Location: Right, Lateral Lower Leg Wound Open Wounding Event: Gradually Appeared Status: Date Acquired: 11/19/2019 Comorbid Cataracts, Anemia, Arrhythmia, Congestive Weeks Of Treatment: 12 History: Heart Failure, Hypertension, Peripheral Clustered Wound: No Venous Disease, Received Radiation Wound Measurements Length: (cm)  0.8 % Reduct Width: (cm) 0.6 % Reduct Depth: (cm) 0.1 Epitheli Area: (cm) 0.377 Volume: (cm) 0.038 Wound Description Classification: Full Thickness Without Exposed Support Foul Odo Structures Slough/F Wound Flat and Intact Margin: Exudate Small Amount: Exudate Serous Type: Exudate amber Color: Wound Bed Granulation Amount: None Present (0%) Necrotic Amount: None Present (0%) Fascia E Fat Laye Tendon E Muscle E Joint Ex Bone Expos r After Cleansing: No ibrino No Exposed Structure xposed: No r (Subcutaneous Tissue) Exposed: Yes xposed: No xposed: No posed: No ed: No ion in Area: 31.5% ion in Volume: 30.9% alization: Small (1-33%) Treatment Notes Wound #3 (Right, Lateral Lower Leg) 2. Periwound Care Moisturizing lotion 3. Primary Dressing Applied Endoform 4. Secondary Dressing Dry Gauze Other secondary dressing (specify in notes) 6. Support Layer Applied 3 layer compression  wrap Notes adaptic, netting. Electronic Signature(s) Signed: 03/14/2020 4:56:09 PM By: Carlene Coria RN Entered By: Carlene Coria on 03/14/2020 09:03:25 -------------------------------------------------------------------------------- Wound Assessment Details Patient Name: Date of Service: [name], [name] 03/14/2020 9:00 AM Medical Record [id number] Patient Account Number: [id number] Date of Birth/Sex: Treating RN: 10-Oct-1933 (86 y.o. [name] Primary Care Alexy Heldt: Lajean Manes T Other Clinician: Referring Ivet Guerrieri: Treating Crystalee Ventress/Extender:Robson, Secundino Ginger, Hal T Weeks in Treatment: 12 Wound Status Wound Number: 4 Primary Pressure Ulcer Etiology: Wound Location: Left, Medial Foot Wound Open Wounding Event: Shear/Friction Status: Date Acquired: 01/19/2020 Comorbid Cataracts, Anemia, Arrhythmia, Congestive Weeks Of Treatment: 7 History: Heart Failure, Hypertension, Peripheral Clustered Wound: No Venous Disease, Received Radiation Wound Measurements Length: (cm) 0.2 % Reductio Width: (cm) 0.4 % Reductio Depth: (cm) 0.1 Epithelial Area: (cm) 0.063 Volume: (cm) 0.006 Wound Description Classification: Category/Stage II Wound Margin: Flat and Intact Exudate Amount: Small Exudate Type: Serous Exudate Color: amber Wound Bed Granulation Amount: Large (67-100%) Granulation Quality: Pink, Pale Necrotic Amount: None Present (0%) Foul Odor After Cleansing: No Slough/Fibrino Yes Exposed Structure Fascia Exposed: No Fat Layer (Subcutaneous Tissue) Exposed: Yes Tendon Exposed: No Muscle Exposed: No Joint Exposed: No Bone Exposed: No n in Area: 0% n in Volume: 0% ization: Small (1-33%) Treatment Notes Wound #4 (Left, Medial Foot) 3. Primary Dressing Applied Endoform 4. Secondary Dressing Other secondary dressing (specify in notes) Foam Border Dressing 6. Support Layer Applied Other support layer (specify in notes) Notes adaptic, Applied jobst  farrow wrap 4000. Electronic Signature(s) Signed: 03/14/2020 4:56:09 PM By: Carlene Coria RN Entered By: Carlene Coria on 03/14/2020 09:03:32 -------------------------------------------------------------------------------- [place] Details Patient Name: Date of Service: [name] 03/14/2020 9:00 AM Medical Record [id number] Patient Account Number: [id number] Date of Birth/Sex: Treating RN: [birth year] (86 y.o. [name] Primary Care Othmar Ringer: Lajean Manes T Other Clinician: Referring Kizzie Cotten: Treating Wynell Halberg/Extender:Robson, Secundino Ginger, Hal T Weeks in Treatment: 12 Vital Signs Time Taken: 09:01 Temperature (F): 97.6 Height (in): 69 Pulse (bpm): 76 Weight (lbs): 163 Respiratory Rate (breaths/min): 18 Body Mass Index (BMI): 24.1 Blood Pressure (mmHg): 157/74 Reference Range: 80 - 120 mg / dl Electronic Signature(s) Signed: 03/14/2020 4:56:09 PM By: Carlene Coria RN Entered By: Carlene Coria on 03/14/2020 09:02:15

## 2020-03-17 NOTE — Progress Notes (Signed)
[name], [name] ([id number]) Visit Report for 03/14/2020 [place] Details Patient Name: Date of Service: [name], [name] 03/14/2020 Medical Record [id number] Patient Account Number: [id number] Date of Birth/Sex: Treating RN: 07-Jan-1933 (86 y.o. [name] Primary Care Provider: Lajean Manes T Other Clinician: Referring Provider: Treating Provider/Extender:Araseli Sherry, Secundino Ginger, Hal T Weeks in Treatment: 12 Diagnosis Coding ICD-10 Codes Code Description I87.321 Chronic venous hypertension (idiopathic) with inflammation of right lower extremity L97.812 Non-pressure chronic ulcer of other part of right lower leg with fat layer exposed I87.322 Chronic venous hypertension (idiopathic) with inflammation of left lower extremity L97.522 Non-pressure chronic ulcer of other part of left foot with fat layer exposed Facility Procedures The patient participates with Medicare or their insurance follows the Medicare Facility Guidelines CPT4 Code Description Modifier Quantity [id number] (Facility Use Only) [phone] - APPLY MULTLAY COMPRS LWR RT LEG 1 Electronic Signature(s) Signed: 03/14/2020 9:44:53 AM By: [name] RN, BSN Signed: 03/17/2020 7:44:34 AM By: Linton Ham MD Entered By: [name] on 03/14/2020 09:35:11

## 2020-03-18 ENCOUNTER — Telehealth: Payer: Self-pay | Admitting: Hematology and Oncology

## 2020-03-18 NOTE — Telephone Encounter (Signed)
Scheduled appt per 4/6 sch message - pt is aware of appt date and time

## 2020-03-20 ENCOUNTER — Telehealth: Payer: Self-pay | Admitting: *Deleted

## 2020-03-20 NOTE — Telephone Encounter (Signed)
Received call from patient stating when she spoke with Dr. Lindi Adie in phone visit in 8/20.  He stated she would only see him on an as needed basis.  She states she received a call to schedule for this August. Verified his note which states to return on an as needed basis.  Informed her I would cancel the appointment. Patient verbalized understanding.

## 2020-03-21 ENCOUNTER — Other Ambulatory Visit: Payer: Self-pay

## 2020-03-21 ENCOUNTER — Encounter (HOSPITAL_BASED_OUTPATIENT_CLINIC_OR_DEPARTMENT_OTHER): Payer: Medicare Other | Admitting: Internal Medicine

## 2020-03-21 DIAGNOSIS — L97522 Non-pressure chronic ulcer of other part of left foot with fat layer exposed: Secondary | ICD-10-CM | POA: Diagnosis not present

## 2020-03-24 NOTE — Progress Notes (Signed)
[name] ([id number]) Visit Report for 03/21/2020 Arrival Information Details Patient Name: Date of Service: [name] 03/21/2020 10:00 AM Medical Record [id number] Patient Account Number: [id number] Date of Birth/Sex: Treating RN: 1933/07/31 (86 y.o. [name] Primary Care Chanise Habeck: Lajean Manes T Other Clinician: Referring Ilisha Blust: Treating Rosie Golson/Extender:Robson, Secundino Ginger, Hal T Weeks in Treatment: 13 Visit Information History Since Last Visit Added or deleted any medications: No Patient Arrived: Ambulatory Any new allergies or adverse reactions: No Arrival Time: 10:00 Had a fall or experienced change in No Accompanied By: alone activities of daily living that may affect Transfer Assistance: None risk of falls: Patient Identification Verified: Yes Signs or symptoms of abuse/neglect since last No Secondary Verification Process Yes visito Completed: Hospitalized since last visit: No Patient Requires Transmission-Based No Implantable device outside of the clinic excluding No Precautions: cellular tissue based products placed in the center Patient Has Alerts: Yes since last visit: Patient Alerts: Patient on Blood Has Dressing in Place as Prescribed: Yes Thinner Has Compression in Place as Prescribed: Yes Right ABI:0.99 Pain Present Now: No Electronic Signature(s) Signed: 03/24/2020 6:12:10 PM By: [name] RN, BSN Entered By: [name] on 03/21/2020 10:00:16 -------------------------------------------------------------------------------- Compression Therapy Details Patient Name: Date of Service: [name] 03/21/2020 10:00 AM Medical Record [id number] Patient Account Number: [id number] Date of Birth/Sex: Treating RN: 1933/11/19 (86 y.o. [name] Primary Care Lynzie Cliburn: Patrina Levering Other Clinician: Referring Sou Nohr: Treating Travone Georg/Extender:Robson, Secundino Ginger, Hal T Weeks in  Treatment: 13 Compression Therapy Performed for Wound Wound #3 Right,Lateral Lower Leg Assessment: Performed By: Clinician [name], RN Compression Type: Three Layer Post Procedure Diagnosis Same as Pre-procedure Electronic Signature(s) Signed: 03/21/2020 6:04:52 PM By: Kela Millin Entered By: Kela Millin on 03/21/2020 11:14:12 -------------------------------------------------------------------------------- Compression Therapy Details Patient Name: Date of Service: [name] 03/21/2020 10:00 AM Medical Record [id number] Patient Account Number: [id number] Date of Birth/Sex: Treating RN: 12-17-1932 (86 y.o. [name] Primary Care Yeudiel Mateo: Patrina Levering Other Clinician: Referring Jahaan Vanwagner: Treating Dorella Laster/Extender:Robson, Secundino Ginger, Hal T Weeks in Treatment: 13 Compression Therapy Performed for Wound Wound #5 Left,Lateral Lower Leg Assessment: Performed By: Clinician [name], RN Compression Type: Three Layer Post Procedure Diagnosis Same as Pre-procedure Electronic Signature(s) Signed: 03/21/2020 6:04:52 PM By: Kela Millin Entered By: Kela Millin on 03/21/2020 11:14:12 -------------------------------------------------------------------------------- Lower Extremity Assessment Details Patient Name: Date of Service: [name] 03/21/2020 10:00 AM Medical Record [id number] Patient Account Number: [id number] Date of Birth/Sex: Treating RN: 11/25/1933 (86 y.o. [name] Primary Care Madelynn Malson: Patrina Levering Other Clinician: Referring Karman Veney: Treating Eustace Hur/Extender:Robson, Secundino Ginger, Hal T Weeks in Treatment: 13 Edema Assessment Assessed: [Left: No] [Right: No] Edema: [Left: No] [Right: No] Calf Left: Right: Point of Measurement: 42 cm From Medial Instep 32.6 cm 30.4 cm Ankle Left: Right: Point of Measurement: 13 cm From Medial Instep 20 cm 18.6 cm Vascular  Assessment Pulses: Dorsalis Pedis Palpable: [Left:Yes] [Right:Yes] Blood Pressure: Brachial: [Left:154] Dorsalis Pedis: 170 Ankle: Posterior Tibial: 162 Ankle Brachial Index: [Left:1.10] Electronic Signature(s) Signed: 03/21/2020 6:10:03 PM By: [name] RN, BSN Signed: 03/24/2020 6:12:10 PM By: [name] RN, BSN Entered By: [name] on 03/21/2020 11:13:27 -------------------------------------------------------------------------------- Multi Wound Chart Details Patient Name: Date of Service: [name]. 03/21/2020 10:00 AM Medical Record [id number] Patient Account Number: [id number] Date of Birth/Sex: Treating RN: 1933-07-09 (86 y.o. [name] Primary Care Ella Golomb: Patrina Levering Other Clinician: Referring Kiowa Peifer: Treating Sinai Illingworth/Extender:Robson, Secundino Ginger, Hal T Weeks in Treatment: 13 Vital Signs  Height(in): 69 Pulse(bpm): 57 Weight(lbs): 163 Blood Pressure(mmHg): 154/55 Body Mass Index(BMI): 24 Temperature(F): 97.6 Respiratory 16 Rate(breaths/min): Photos: [3:No Photos] [4:No Photos] [5:No Photos] Wound Location: [3:Right, Lateral Lower Leg] [4:Left, Medial Foot] [5:Left, Lateral Lower Leg] Wounding Event: [3:Gradually Appeared] [4:Shear/Friction] [5:Gradually Appeared] Primary Etiology: [3:Venous Leg Ulcer] [4:Pressure Ulcer] [5:Venous Leg Ulcer] Comorbid History: [3:Cataracts, Anemia, Arrhythmia, Congestive Heart Failure, Hypertension, Heart Failure, Hypertension, Heart Failure, Hypertension, Peripheral Venous Disease, Peripheral Venous Disease, Peripheral Venous Disease, Received Radiation]  [4:Cataracts, Anemia, Arrhythmia, Congestive Received Radiation] [5:Cataracts, Anemia, Arrhythmia, Congestive Received Radiation] Date Acquired: [3:11/19/2019] [4:01/19/2020] [5:03/21/2020] Weeks of Treatment: [3:13] [4:8] [5:0] Wound Status: [3:Open] [4:Open] [5:Open] Measurements L x W x D 0.1x0.1x0.1 [4:0.3x0.4x0.1]  [5:0.8x0.8x0.1] (cm) Area (cm) : [3:0.008] [4:0.094] [5:0.503] Volume (cm) : [3:0.001] [4:0.009] [5:0.05] % Reduction in Area: [3:98.50%] [4:-49.20%] [5:N/A] % Reduction in Volume: [3:98.20%] [4:-50.00%] [5:N/A] Classification: [3:Full Thickness Without Exposed Support Structures] [4:Category/Stage II] [5:Unclassifiable] Exudate Amount: [3:None Present] [4:Small] [5:Small] Exudate Type: [3:N/A] [4:Serosanguineous] [5:Serosanguineous] Exudate Color: [3:N/A] [4:red, brown] [5:red, brown] Wound Margin: [3:Flat and Intact] [4:Flat and Intact] [5:Flat and Intact] Granulation Amount: [3:None Present (0%)] [4:Large (67-100%)] [5:None Present (0%)] Granulation Quality: [3:N/A] [4:Pink] [5:N/A] Necrotic Amount: [3:None Present (0%)] [4:None Present (0%)] [5:Large (67-100%)] Exposed Structures: [3:Fascia: No Fat Layer (Subcutaneous Tissue) Exposed: Yes Tissue) Exposed: No Tendon: No Muscle: No Joint: No Bone: No] [4:Fat Layer (Subcutaneous Fascia: No Tendon: No Muscle: No Joint: No Bone: No] [5:Fascia: No Fat Layer (Subcutaneous Tissue)  Exposed: No Tendon: No Muscle: No Joint: No Bone: No] Epithelialization: [3:Large (67-100%)] [4:Medium (34-66%)] [5:None] Debridement: [3:N/A] [4:Debridement - Excisional] [5:Debridement - Excisional] Pre-procedure [3:N/A] [4:10:55] [5:10:55] Verification/Time Out Taken: Pain Control: [3:N/A] [4:Other] [5:Other] Tissue Debrided: [3:N/A] [4:Callus, Subcutaneous] [5:Subcutaneous, Slough] Level: [3:N/A] [4:Skin/Subcutaneous Tissue] [5:Skin/Subcutaneous Tissue] Debridement Area (sq cm):N/A [4:0.12] [5:0.64] Instrument: [3:N/A] [4:Curette] [5:Curette] Bleeding: [3:N/A] [4:Minimum] [5:Minimum] Hemostasis Achieved: [3:N/A] [4:Pressure] [5:Pressure] Procedural Pain: [3:N/A] [4:0] [5:0] Post Procedural Pain: [3:N/A] [4:0] [5:0] Debridement Treatment N/A [4:Procedure was tolerated] [5:Procedure was tolerated] Response: [4:well] [5:well] Post Debridement [3:N/A]  [4:0.3x0.4x0.1] [5:0.8x0.8x0.1] Measurements L x W x D (cm) Post Debridement [3:N/A] [4:0.009] [5:0.05] Volume: (cm) Post Debridement Stage: N/A [4:Category/Stage II] [5:N/A] Procedures Performed: Compression Therapy [4:Debridement] [5:Compression Therapy Debridement] Treatment Notes Electronic Signature(s) Signed: 03/21/2020 6:04:52 PM By: Kela Millin Signed: 03/24/2020 9:07:11 AM By: Linton Ham MD Entered By: Linton Ham on 03/21/2020 11:30:50 -------------------------------------------------------------------------------- Multi-Disciplinary Care Plan Details Patient Name: Date of Service: [name]. 03/21/2020 10:00 AM Medical Record [id number] Patient Account Number: [id number] Date of Birth/Sex: Treating RN: 09-08-1933 (86 y.o. [name] Primary Care Adena Sima: Patrina Levering Other Clinician: Referring Jamicah Anstead: Treating Alberta Lenhard/Extender:Robson, Secundino Ginger, Hal T Weeks in Treatment: 13 Active Inactive Abuse / Safety / Falls / Self Care Management Nursing Diagnoses: Potential for falls Goals: Patient/caregiver will verbalize understanding of skin care regimen Date Initiated: 12/20/2019 Date Inactivated: 02/14/2020 Target Resolution Date: 02/22/2020 Goal Status: Met Patient/caregiver will verbalize/demonstrate understanding of what to do in case of emergency Date Initiated: 12/20/2019 Target Resolution Date: 03/28/2020 Goal Status: Active Interventions: Assess fall risk on admission and as needed Provide education on fall prevention Notes: Pain, Acute or Chronic Nursing Diagnoses: Pain, acute or chronic: actual or potential Potential alteration in comfort, pain Goals: Patient will verbalize adequate pain control and receive pain control interventions during procedures as needed Date Initiated: 12/20/2019 Date Inactivated: 02/14/2020 Target Resolution Date: 02/22/2020 Goal Status: Met Patient/caregiver will verbalize comfort level  met Date Initiated: 12/20/2019 Target Resolution Date: 04/18/2020 Goal Status: Active Interventions: Complete pain  assessment as per visit requirements Provide education on pain management Notes: Wound/Skin Impairment Nursing Diagnoses: Knowledge deficit related to ulceration/compromised skin integrity Goals: Patient/caregiver will verbalize understanding of skin care regimen Date Initiated: 12/20/2019 Target Resolution Date: 03/21/2020 Goal Status: Active Interventions: Assess patient/caregiver ability to perform ulcer/skin care regimen upon admission and as needed Provide education on ulcer and skin care Treatment Activities: Skin care regimen initiated : 12/20/2019 Topical wound management initiated : 12/20/2019 Notes: Electronic Signature(s) Signed: 03/21/2020 6:04:52 PM By: Kela Millin Entered By: Kela Millin on 03/21/2020 10:21:04 -------------------------------------------------------------------------------- Pain Assessment Details Patient Name: Date of Service: [name] 03/21/2020 10:00 AM Medical Record [id number] Patient Account Number: [id number] Date of Birth/Sex: Treating RN: December 01, 1933 (86 y.o. [name] Primary Care Virdie Penning: Patrina Levering Other Clinician: Referring Dorothy Landgrebe: Treating Aneisa Karren/Extender:Robson, Secundino Ginger, Hal T Weeks in Treatment: 13 Active Problems Location of Pain Severity and Description of Pain Patient Has Paino No Site Locations Pain Management and Medication Current Pain Management: Electronic Signature(s) Signed: 03/24/2020 6:12:10 PM By: [name] RN, BSN Entered By: [name] on 03/21/2020 10:00:29 -------------------------------------------------------------------------------- Patient/Caregiver Education Details Patient Name: Date of Service: Abramovich, Gabryel J. 4/9/2021andnbsp10:00 AM Medical Record [id number] Patient Account Number: [id number] Date of Birth/Gender: 19-Jan-1933 (87  y.o. F) Treating RN: Kela Millin Primary Care Physician: Patrina Levering Other Clinician: Referring Physician: Treating Physician/Extender:Robson, Secundino Ginger, Junius Creamer in Treatment: 13 Education Assessment Education Provided To: Patient Education Topics Provided Pain: Methods: Explain/Verbal Responses: State content correctly Safety: Methods: Explain/Verbal Responses: State content correctly Wound/Skin Impairment: Methods: Explain/Verbal Responses: State content correctly Electronic Signature(s) Signed: 03/21/2020 6:04:52 PM By: Kela Millin Entered By: Kela Millin on 03/21/2020 10:21:27 -------------------------------------------------------------------------------- Wound Assessment Details Patient Name: Date of Service: [name] 03/21/2020 10:00 AM Medical Record [id number] Patient Account Number: [id number] Date of Birth/Sex: Treating RN: [birth year] (86 y.o. [name] Primary Care Kyliee Ortego: Lajean Manes T Other Clinician: Referring Angellica Maddison: Treating Foye Haggart/Extender:Robson, Secundino Ginger, Hal T Weeks in Treatment: 13 Wound Status Wound Number: 3 Primary Venous Leg Ulcer Etiology: Wound Location: Right, Lateral Lower Leg Wound Open Wounding Event: Gradually Appeared Status: Date Acquired: 11/19/2019 Comorbid Cataracts, Anemia, Arrhythmia, Congestive Weeks Of Treatment: 13 History: Heart Failure, Hypertension, Peripheral Clustered Wound: No Venous Disease, Received Radiation Photos Wound Measurements Length: (cm) 0.1 % Reduct Width: (cm) 0.1 % Reduct Depth: (cm) 0.1 Epitheli Area: (cm) 0.008 Tunneli Volume: (cm) 0.001 Undermi Wound Description Full Thickness Without Exposed Support Foul Odo Classification: Structures Slough/F Wound Flat and Intact Margin: Exudate None Present Amount: Wound Bed Granulation Amount: None Present (0%) Necrotic Amount: None Present (0%) Fascia E Fat Laye Tendon  E Muscle E Joint Ex Bone Exp r After Cleansing: No ibrino No Exposed Structure xposed: No r (Subcutaneous Tissue) Exposed: No xposed: No xposed: No posed: No osed: No ion in Area: 98.5% ion in Volume: 98.2% alization: Large (67-100%) ng: No ning: No Electronic Signature(s) Signed: 03/24/2020 4:10:46 PM By: Mikeal Hawthorne EMT/HBOT Signed: 03/24/2020 5:39:59 PM By: [name] RN, BSN Previous Signature: 03/21/2020 6:10:03 PM Version By: [name] RN, BSN Entered By: Mikeal Hawthorne on 03/24/2020 15:02:34 -------------------------------------------------------------------------------- Wound Assessment Details Patient Name: Date of Service: [name] 03/21/2020 10:00 AM Medical Record [id number] Patient Account Number: [id number] Date of Birth/Sex: Treating RN: Jan 18, 1933 (86 y.o. [name] Primary Care Fiona Coto: Patrina Levering Other Clinician: Referring Janijah Symons: Treating Iver Miklas/Extender:Robson, Secundino Ginger, Hal T Weeks in Treatment: 13 Wound Status Wound Number: 4 Primary Pressure Ulcer Etiology: Wound Location: Left, Medial Foot  Wound Open Wounding Event: Shear/Friction Status: Date Acquired: 01/19/2020 Comorbid Cataracts, Anemia, Arrhythmia, Congestive Weeks Of Treatment: 8 History: Heart Failure, Hypertension, Peripheral Clustered Wound: No Venous Disease, Received Radiation Photos Wound Measurements Length: (cm) 0.3 % Reductio Width: (cm) 0.4 % Reductio Depth: (cm) 0.1 Epithelial Area: (cm) 0.094 Tunneling Volume: (cm) 0.009 Undermini Wound Description Classification: Category/Stage II Wound Margin: Flat and Intact Exudate Amount: Small Exudate Type: Serosanguineous Exudate Color: red, brown Wound Bed Granulation Amount: Large (67-100%) Granulation Quality: Pink Necrotic Amount: None Present (0%) Foul Odor After Cleansing: No Slough/Fibrino No Exposed Structure Fascia Exposed: No Fat Layer (Subcutaneous Tissue)  Exposed: Yes Tendon Exposed: No Muscle Exposed: No Joint Exposed: No Bone Exposed: No n in Area: -49.2% n in Volume: -50% ization: Medium (34-66%) : No ng: No Electronic Signature(s) Signed: 03/24/2020 4:10:46 PM By: Mikeal Hawthorne EMT/HBOT Signed: 03/24/2020 6:12:10 PM By: [name] RN, BSN Entered By: Mikeal Hawthorne on 03/24/2020 15:11:33 -------------------------------------------------------------------------------- Wound Assessment Details Patient Name: Date of Service: [name] 03/21/2020 10:00 AM Medical Record [id number] Patient Account Number: [id number] Date of Birth/Sex: Treating RN: 27-Oct-1933 (86 y.o. [name] Primary Care Emri Sample: Lajean Manes T Other Clinician: Referring Kamilo Och: Treating Nickie Deren/Extender:Robson, Secundino Ginger, Hal T Weeks in Treatment: 13 Wound Status Wound Number: 5 Primary Venous Leg Ulcer Etiology: Wound Location: Left, Lateral Lower Leg Wound Open Wounding Event: Gradually Appeared Status: Date Acquired: 03/21/2020 Comorbid Cataracts, Anemia, Arrhythmia, Congestive Weeks Of Treatment: 0 History: Heart Failure, Hypertension, Peripheral Clustered Wound: No Venous Disease, Received Radiation Photos Wound Measurements Length: (cm) 0.8 Width: (cm) 0.8 Depth: (cm) 0.1 Area: (cm) 0.503 Volume: (cm) 0.05 Wound Description Classification: Unclassifiable Wound Margin: Flat and Intact Exudate Amount: Small Exudate Type: Serosanguineous Exudate Color: red, brown Wound Bed Granulation Amount: None Present (0%) Necrotic Amount: Large (67-100%) Necrotic Quality: Adherent Slough After Cleansing: No brino Yes Exposed Structure osed: No (Subcutaneous Tissue) Exposed: No osed: No osed: No sed: No ed: No % Reduction in Area: 0% % Reduction in Volume: 0% Epithelialization: None Tunneling: No Undermining: No Foul Odor Slough/Fi Fascia Exp Fat Layer Tendon Exp Muscle Exp Joint Expo Bone  Expos Electronic Signature(s) Signed: 03/24/2020 4:10:46 PM By: Mikeal Hawthorne EMT/HBOT Signed: 03/24/2020 6:12:10 PM By: [name] RN, BSN Entered By: Mikeal Hawthorne on 03/24/2020 15:11:55 -------------------------------------------------------------------------------- [place] Details Patient Name: Date of Service: [name]. 03/21/2020 10:00 AM Medical Record [id number] Patient Account Number: [id number] Date of Birth/Sex: Treating RN: 09/24/1933 (86 y.o. [name] Primary Care Annmargaret Decaprio: Lajean Manes T Other Clinician: Referring Venba Zenner: Treating Kempton Milne/Extender:Robson, Secundino Ginger, Hal T Weeks in Treatment: 13 Vital Signs Time Taken: 10:00 Temperature (F): 97.6 Height (in): 69 Pulse (bpm): 57 Weight (lbs): 163 Respiratory Rate (breaths/min): 16 Body Mass Index (BMI): 24.1 Blood Pressure (mmHg): 154/55 Reference Range: 80 - 120 mg / dl Electronic Signature(s) Signed: 03/24/2020 6:12:10 PM By: [name] RN, BSN Entered By: [name] on 03/21/2020 10:01:35

## 2020-03-24 NOTE — Progress Notes (Signed)
[name], [name] ([id number]) Visit Report for 03/21/2020 Debridement Details Patient Name: Date of Service: [name], [name] 03/21/2020 10:00 AM Medical Record [id number] Patient Account Number: [id number] Date of Birth/Sex: Treating RN: 04/03/1933 (86 y.o. [name] Primary Care Provider: Lajean Manes T Other Clinician: Referring Provider: Treating Provider/Extender:Zakariyah Freimark, Secundino Ginger, Junius Creamer in Treatment: 13 Debridement Performed for Wound #4 Left,Medial Foot Assessment: Performed By: Physician Ricard Dillon., MD Debridement Type: Debridement Level of Consciousness (Pre- Awake and Alert procedure): Pre-procedure Yes - 10:55 Verification/Time Out Taken: Start Time: 10:55 Pain Control: Other : benzocaine, 20% Total Area Debrided (L x W): 0.3 (cm) x 0.4 (cm) = 0.12 (cm) Tissue and other material Viable, Non-Viable, Callus, Subcutaneous debrided: Level: Skin/Subcutaneous Tissue Debridement Description: Excisional Instrument: Curette Bleeding: Minimum Hemostasis Achieved: Pressure End Time: 10:56 Procedural Pain: 0 Post Procedural Pain: 0 Response to Treatment: Procedure was tolerated well Level of Consciousness Awake and Alert (Post-procedure): Post Debridement Measurements of Total Wound Length: (cm) 0.3 Stage: Category/Stage II Width: (cm) 0.4 Depth: (cm) 0.1 Volume: (cm) 0.009 Character of Wound/Ulcer Post Improved Debridement: Post Procedure Diagnosis Same as Pre-procedure Electronic Signature(s) Signed: 03/21/2020 6:04:52 PM By: Kela Millin Signed: 03/24/2020 9:07:11 AM By: Linton Ham MD Entered By: Linton Ham on 03/21/2020 11:31:02 -------------------------------------------------------------------------------- Debridement Details Patient Name: Date of Service: [name]. 03/21/2020 10:00 AM Medical Record [id number] Patient Account Number: [id number] Date of Birth/Sex: Treating RN: February 01, 1933 (86  y.o. [name] Primary Care Provider: Lajean Manes T Other Clinician: Referring Provider: Treating Provider/Extender:Mcclellan Demarais, Secundino Ginger, Hal T Weeks in Treatment: 13 Debridement Performed for Wound #5 Left,Lateral Lower Leg Assessment: Performed By: Physician Ricard Dillon., MD Debridement Type: Debridement Severity of Tissue Pre Fat layer exposed Debridement: Level of Consciousness (Pre- Awake and Alert procedure): Pre-procedure Yes - 10:55 Verification/Time Out Taken: Start Time: 10:55 Pain Control: Other : benzocaine, 20% Total Area Debrided (L x W): 0.8 (cm) x 0.8 (cm) = 0.64 (cm) Tissue and other material Viable, Non-Viable, Slough, Subcutaneous, Slough debrided: Level: Skin/Subcutaneous Tissue Debridement Description: Excisional Instrument: Curette Bleeding: Minimum Hemostasis Achieved: Pressure End Time: 10:56 Procedural Pain: 0 Post Procedural Pain: 0 Response to Treatment: Procedure was tolerated well Level of Consciousness Awake and Alert (Post-procedure): Post Debridement Measurements of Total Wound Length: (cm) 0.8 Width: (cm) 0.8 Depth: (cm) 0.1 Volume: (cm) 0.05 Character of Wound/Ulcer Post Improved Debridement: Severity of Tissue Post Debridement: Fat layer exposed Post Procedure Diagnosis Same as Pre-procedure Electronic Signature(s) Signed: 03/21/2020 6:04:52 PM By: Kela Millin Signed: 03/24/2020 9:07:11 AM By: Linton Ham MD Entered By: Linton Ham on 03/21/2020 11:31:12 -------------------------------------------------------------------------------- HPI Details Patient Name: Date of Service: [name]. 03/21/2020 10:00 AM Medical Record [id number] Patient Account Number: [id number] Date of Birth/Sex: Treating RN: 06-11-1933 (86 y.o. [name] Primary Care Provider: Patrina Levering Other Clinician: Referring Provider: Treating Provider/Extender:Ellar Hakala, Secundino Ginger, Hal  T Weeks in Treatment: 13 History of Present Illness HPI Description: ADMISSION 12/20/2019 This is an 86 year old woman referred by her primary physician Dr. Felipa Eth for review of a wound on her right lateral lower leg. She was actually in this clinic on 2 separate occasions in 2010 and 2012 cared for by Dr. Sherilyn Cooter. At that point in time she had wounds on her right leg as well. She tells [place] to 1 month ago she noticed a scab building up on her right lateral lower leg this opened into a wound. There was no overt cause of this no trauma, no infection that she is  aware of. She has a history of chronic venous insufficiency and wears compression stockings fairly religiously indeed she is done well over the last 8 years since she was last in this clinic. She has been applying Vaseline on this and a covering phone. This is not progressing towards healing. Past medical history; interstitial lung disease, chronic atrial fibrillation status post pacemaker, osteoarthritis of the left knee, left breast CA, chronic repeat venous insufficiency. She takes Eliquis for her atrial fibrillation stroke prophylaxis. ABI in our clinic was 0.99 on the right 1/15; superficial wound on the right lateral calf in the setting of severe acute skin changes from chronic venous insufficiency and lymphedema. We used Iodoflex last week she complained of a lot of pain 1/22; this is a small but difficult wound on the right lateral calf in the setting of severe chronic venous insufficiency and secondary lymphedema. She continues to state the wounds things hurts when she is up on it but seems to be relieved by putting her leg up. I changed her to [facility] last week because the Iodoflex seem to be causing stinging. 1/29. This wound appears to be contracting somewhat. Changes of chronic venous insufficiency with secondary lymphedema 2/12; not as good as surface today and slightly bigger. I had to change her from Iodoflex to  [facility] because of the stinging pain although she does not think it was any better on the [facility]. She comes into clinic today with a area on the medial left great toe. She said she noticed blood on her sock last Saturday. She had some form of bunion surgery by Dr. Ila Mcgill her podiatrist sometime in 2019 she said he "shave the area". I could not find his operative report although I did see reference to the bunion in the area. 2/19; somewhat improved wound on the right lateral lower leg however the wound she came in on the bunion of her left great toe is actually I think larger still somewhat inflamed. We have been using Iodoflex 2/26; not much improvement in either wound area which is the original venous wound on the right lateral lower leg and in the area on the bunion of her left great toe. This still looks somewhat inflamed and tender. We have been using Iodoflex with open much improvement. 3/4; in general both her wounds look better this includes the original venous insufficiency wound on the right lateral lower leg and the area on her tip of her bunion on the left great toe medial aspect of the MTP. Change to Hydrofera Blue last week 3/12; the patient still has a small geographic shaped wound on the right lateral lower leg. We have been using Hydrofera Blue but I changed her to endoform today. She also has the area in the tip of the bunion of the left great toe. We will try Hydrofera Blue here as well 3/19; the small geographic wound on the right lateral lower leg has not filled in. There is some surrounding induration we have noticed this previously. This may be venous inflammation but I wonder about biopsying this if this is not closing up. The area over the left medial first MTP [bunion deformity] requires debridement. This is not closing in. I am not convinced she is offloading adequately 3/26; right lateral lower leg in the setting of severe venous insufficiency and  also an area over the first MTP bunion deformity. No debridement is required. We have been using endoform 4/9; right lateral lower leg wound in the  setting of severe venous insufficiency and also a refractory area over the first MTP bunion deformity. The area on the right lateral lower leg is just about closed there is still a minor open area here. She comes in today with a mirror image area on the left lateral lower leg. She says this started as a scab 2 weeks ago and is gradually morphed into an open wound very similar to what she has on the right leg. She has severe bilateral venous insufficiency with venous hypertension obvious from clinical exam. Electronic Signature(s) Signed: 03/24/2020 9:07:11 AM By: Linton Ham MD Entered By: Linton Ham on 03/21/2020 11:33:04 -------------------------------------------------------------------------------- Physical Exam Details Patient Name: Date of Service: [name] 03/21/2020 10:00 AM Medical Record [id number] Patient Account Number: [id number] Date of Birth/Sex: Treating RN: [birth year] (86 y.o. [name] Primary Care Provider: Patrina Levering Other Clinician: Referring Provider: Treating Provider/Extender:Jahron Hunsinger, Secundino Ginger, Hal T Weeks in Treatment: 13 Constitutional Patient is hypertensive.. Pulse regular and within target range for patient.Marland Kitchen Respirations regular, non-labored and within target range.. Temperature is normal and within the target range for the patient.Marland Kitchen Appears in no distress. Cardiovascular Pedal pulses are palpable. Notes Wound exam; the area we have been following on the right lateral lower leg is just about closed. No debridement was required here. The area on the left first MTP has callus and thick skin over the area. I removed this with a #3 curette. Unfortunately she has a new mirror-image wound on the left lateral lower leg. Small punched out area with debris in the center I  did as much above debridement with a #3 curette as I think she could stand. Electronic Signature(s) Signed: 03/24/2020 9:07:11 AM By: Linton Ham MD Entered By: Linton Ham on 03/21/2020 11:35:53 -------------------------------------------------------------------------------- Physician Orders Details Patient Name: Date of Service: [name] 03/21/2020 10:00 AM Medical Record ON:[id number] Patient Account Number: [id number] Date of Birth/Sex: Treating RN: 1933/05/10 (86 y.o. [name] Primary Care Provider: Lajean Manes T Other Clinician: Referring Provider: Treating Provider/Extender:Philopateer Strine, Secundino Ginger, Junius Creamer in Treatment: 13 Verbal / Phone Orders: No Diagnosis Coding ICD-10 Coding Code Description I87.321 Chronic venous hypertension (idiopathic) with inflammation of right lower extremity L97.812 Non-pressure chronic ulcer of other part of right lower leg with fat layer exposed I87.322 Chronic venous hypertension (idiopathic) with inflammation of left lower extremity L97.522 Non-pressure chronic ulcer of other part of left foot with fat layer exposed Follow-up Appointments Return Appointment in 1 week. - Friday Dressing Change Frequency Wound #3 Right,Lateral Lower Leg Do not change entire dressing for one week. Wound #4 Left,Medial Foot Change Dressing every other day. - only to left medial foot wound Wound #5 Left,Lateral Lower Leg Do not change entire dressing for one week. Skin Barriers/Peri-Wound Care Moisturizing lotion TCA Cream or Ointment - mixed with lotion to leg, TCA over wound bed as well Wound Cleansing May shower with protection. - cast protector Primary Wound Dressing Wound #3 Right,Lateral Lower Leg Endoform - moisten with saline Wound #4 Left,Medial Foot Endoform - moisten with saline Wound #5 Left,Lateral Lower Leg Iodoflex Secondary Dressing Wound #3 Right,Lateral Lower Leg Dry Gauze Wound #5 Left,Lateral  Lower Leg Dry Gauze Wound #4 Left,Medial Foot Foam - or tape Dry Gauze Edema Control 3 Layer Compression System - Bilateral Avoid standing for long periods of time Elevate legs to the level of the heart or above for 30 minutes daily and/or when sitting, a frequency of: - throughout the day. Exercise regularly Support Garment  30-40 mm/Hg pressure to: - farrow wrap 4000 for to left leg Off-Loading Open toe surgical shoe to: - left [employer] Venous Reflux Studies - Bilateral Venous Reflux Studies, Wounds to Right and Left Lateral Lower Legs - (ICD10 [id number] - Non-pressure chronic ulcer of other part of right lower leg with fat layer exposed) Electronic Signature(s) Signed: 03/21/2020 6:04:52 PM By: Kela Millin Signed: 03/24/2020 9:07:11 AM By: Linton Ham MD Entered By: Kela Millin on 03/21/2020 11:03:56 -------------------------------------------------------------------------------- Problem List Details Patient Name: Date of Service: [name]. 03/21/2020 10:00 AM Medical Record ON:[id number] Patient Account Number: [id number] Date of Birth/Sex: Treating RN: 1933/02/14 (85 y.o. [name] Primary Care Provider: Lajean Manes T Other Clinician: Referring Provider: Treating Provider/Extender:Leng Montesdeoca, Secundino Ginger, Junius Creamer in Treatment: 13 Active Problems ICD-10 Evaluated Encounter Code Description Active Date Today Diagnosis I87.321 Chronic venous hypertension (idiopathic) with 12/20/2019 No Yes inflammation of right lower extremity L97.812 Non-pressure chronic ulcer of other part of right lower 12/20/2019 No Yes leg with fat layer exposed L97.828 Non-pressure chronic ulcer of other part of left lower 03/21/2020 No Yes leg with other specified severity I87.322 Chronic venous hypertension (idiopathic) with 02/14/2020 No Yes inflammation of left lower extremity L97.522 Non-pressure chronic ulcer of other part of left foot 01/25/2020 No  Yes with fat layer exposed Inactive Problems Resolved Problems Electronic Signature(s) Signed: 03/24/2020 9:07:11 AM By: Linton Ham MD Entered By: Linton Ham on 03/21/2020 11:38:55 -------------------------------------------------------------------------------- Progress Note Details Patient Name: Date of Service: [name] 03/21/2020 10:00 AM Medical Record ON:[id number] Patient Account Number: [id number] Date of Birth/Sex: Treating RN: Jun 30, 1933 (86 y.o. [name] Primary Care Provider: Patrina Levering Other Clinician: Referring Provider: Treating Provider/Extender:Carrington Mullenax, Secundino Ginger, Hal T Weeks in Treatment: 13 Subjective History of Present Illness (HPI) ADMISSION 12/20/2019 This is an 86 year old woman referred by her primary physician Dr. Felipa Eth for review of a wound on her right lateral lower leg. She was actually in this clinic on 2 separate occasions in 2010 and 2012 cared for by Dr. Sherilyn Cooter. At that point in time she had wounds on her right leg as well. She tells [place] to 1 month ago she noticed a scab building up on her right lateral lower leg this opened into a wound. There was no overt cause of this no trauma, no infection that she is aware of. She has a history of chronic venous insufficiency and wears compression stockings fairly religiously indeed she is done well over the last 8 years since she was last in this clinic. She has been applying Vaseline on this and a covering phone. This is not progressing towards healing. Past medical history; interstitial lung disease, chronic atrial fibrillation status post pacemaker, osteoarthritis of the left knee, left breast CA, chronic repeat venous insufficiency. She takes Eliquis for her atrial fibrillation stroke prophylaxis. ABI in our clinic was 0.99 on the right 1/15; superficial wound on the right lateral calf in the setting of severe acute skin changes from chronic venous insufficiency  and lymphedema. We used Iodoflex last week she complained of a lot of pain 1/22; this is a small but difficult wound on the right lateral calf in the setting of severe chronic venous insufficiency and secondary lymphedema. She continues to state the wounds things hurts when she is up on it but seems to be relieved by putting her leg up. I changed her to [facility] last week because the Iodoflex seem to be causing stinging. 1/29. This wound appears to be contracting  somewhat. Changes of chronic venous insufficiency with secondary lymphedema 2/12; not as good as surface today and slightly bigger. I had to change her from Iodoflex to [facility] because of the stinging pain although she does not think it was any better on the [facility]. ooShe comes into clinic today with a area on the medial left great toe. She said she noticed blood on her sock last Saturday. She had some form of bunion surgery by Dr. Ila Mcgill her podiatrist sometime in 2019 she said he "shave the area". I could not find his operative report although I did see reference to the bunion in the area. 2/19; somewhat improved wound on the right lateral lower leg however the wound she came in on the bunion of her left great toe is actually I think larger still somewhat inflamed. We have been using Iodoflex 2/26; not much improvement in either wound area which is the original venous wound on the right lateral lower leg and in the area on the bunion of her left great toe. This still looks somewhat inflamed and tender. We have been using Iodoflex with open much improvement. 3/4; in general both her wounds look better this includes the original venous insufficiency wound on the right lateral lower leg and the area on her tip of her bunion on the left great toe medial aspect of the MTP. Change to Hydrofera Blue last week 3/12; the patient still has a small geographic shaped wound on the right lateral lower leg. We have been  using Hydrofera Blue but I changed her to endoform today. She also has the area in the tip of the bunion of the left great toe. We will try Hydrofera Blue here as well 3/19; the small geographic wound on the right lateral lower leg has not filled in. There is some surrounding induration we have noticed this previously. This may be venous inflammation but I wonder about biopsying this if this is not closing up. The area over the left medial first MTP [bunion deformity] requires debridement. This is not closing in. I am not convinced she is offloading adequately 3/26; right lateral lower leg in the setting of severe venous insufficiency and also an area over the first MTP bunion deformity. No debridement is required. We have been using endoform 4/9; right lateral lower leg wound in the setting of severe venous insufficiency and also a refractory area over the first MTP bunion deformity. The area on the right lateral lower leg is just about closed there is still a minor open area here. She comes in today with a mirror image area on the left lateral lower leg. She says this started as a scab 2 weeks ago and is gradually morphed into an open wound very similar to what she has on the right leg. She has severe bilateral venous insufficiency with venous hypertension obvious from clinical exam. Objective Constitutional Patient is hypertensive.. Pulse regular and within target range for patient.Marland Kitchen Respirations regular, non-labored and within target range.. Temperature is normal and within the target range for the patient.Marland Kitchen Appears in no distress. Vitals Time Taken: 10:00 AM, Height: 69 in, Weight: 163 lbs, BMI: 24.1, Temperature: 97.6 F, Pulse: 57 bpm, Respiratory Rate: 16 breaths/min, Blood Pressure: 154/55 mmHg. Cardiovascular Pedal pulses are palpable. General Notes: Wound exam; the area we have been following on the right lateral lower leg is just about closed. No debridement was required here.  The area on the left first MTP has callus and thick skin over  the area. I removed this with a #3 curette. ooUnfortunately she has a new mirror-image wound on the left lateral lower leg. Small punched out area with debris in the center I did as much above debridement with a #3 curette as I think she could stand. Integumentary (Hair, Skin) Wound #3 status is Open. Original cause of wound was Gradually Appeared. The wound is located on the Right,Lateral Lower Leg. The wound measures 0.1cm length x 0.1cm width x 0.1cm depth; 0.008cm^2 area and 0.001cm^3 volume. There is no tunneling or undermining noted. There is a none present amount of drainage noted. The wound margin is flat and intact. There is no granulation within the wound bed. There is no necrotic tissue within the wound bed. Wound #4 status is Open. Original cause of wound was Shear/Friction. The wound is located on the Left,Medial Foot. The wound measures 0.3cm length x 0.4cm width x 0.1cm depth; 0.094cm^2 area and 0.009cm^3 volume. There is Fat Layer (Subcutaneous Tissue) Exposed exposed. There is no tunneling or undermining noted. There is a small amount of serosanguineous drainage noted. The wound margin is flat and intact. There is large (67-100%) pink granulation within the wound bed. There is no necrotic tissue within the wound bed. Wound #5 status is Open. Original cause of wound was Gradually Appeared. The wound is located on the Left,Lateral Lower Leg. The wound measures 0.8cm length x 0.8cm width x 0.1cm depth; 0.503cm^2 area and 0.05cm^3 volume. There is no tunneling or undermining noted. There is a small amount of serosanguineous drainage noted. The wound margin is flat and intact. There is no granulation within the wound bed. There is a large (67-100%) amount of necrotic tissue within the wound bed including Adherent Slough. Assessment Active Problems ICD-10 Chronic venous hypertension (idiopathic) with inflammation of right  lower extremity Non-pressure chronic ulcer of other part of right lower leg with fat layer exposed Non-pressure chronic ulcer of other part of left lower leg with other specified severity Chronic venous hypertension (idiopathic) with inflammation of left lower extremity Non-pressure chronic ulcer of other part of left foot with fat layer exposed Procedures Wound #4 Pre-procedure diagnosis of Wound #4 is a Pressure Ulcer located on the Left,Medial Foot . There was a Excisional Skin/Subcutaneous Tissue Debridement with a total area of 0.12 sq cm performed by Ricard Dillon., MD. With the following instrument(s): Curette to remove Viable and Non-Viable tissue/material. Material removed includes Callus and Subcutaneous Tissue and after achieving pain control using Other (benzocaine, 20%). No specimens were taken. A time out was conducted at 10:55, prior to the start of the procedure. A Minimum amount of bleeding was controlled with Pressure. The procedure was tolerated well with a pain level of 0 throughout and a pain level of 0 following the procedure. Post Debridement Measurements: 0.3cm length x 0.4cm width x 0.1cm depth; 0.009cm^3 volume. Post debridement Stage noted as Category/Stage II. Character of Wound/Ulcer Post Debridement is improved. Post procedure Diagnosis Wound #4: Same as Pre-Procedure Wound #5 Pre-procedure diagnosis of Wound #5 is a Venous Leg Ulcer located on the Left,Lateral Lower Leg .Severity of Tissue Pre Debridement is: Fat layer exposed. There was a Excisional Skin/Subcutaneous Tissue Debridement with a total area of 0.64 sq cm performed by Ricard Dillon., MD. With the following instrument(s): Curette to remove Viable and Non-Viable tissue/material. Material removed includes Subcutaneous Tissue and Slough and after achieving pain control using Other (benzocaine, 20%). No specimens were taken. A time out was conducted at 10:55, prior to the  start of the procedure. A  Minimum amount of bleeding was controlled with Pressure. The procedure was tolerated well with a pain level of 0 throughout and a pain level of 0 following the procedure. Post Debridement Measurements: 0.8cm length x 0.8cm width x 0.1cm depth; 0.05cm^3 volume. Character of Wound/Ulcer Post Debridement is improved. Severity of Tissue Post Debridement is: Fat layer exposed. Post procedure Diagnosis Wound #5: Same as Pre-Procedure Pre-procedure diagnosis of Wound #5 is a Venous Leg Ulcer located on the Left,Lateral Lower Leg . There was a Three Layer Compression Therapy Procedure by [name], RN. Post procedure Diagnosis Wound #5: Same as Pre-Procedure Wound #3 Pre-procedure diagnosis of Wound #3 is a Venous Leg Ulcer located on the Right,Lateral Lower Leg . There was a Three Layer Compression Therapy Procedure by [name], RN. Post procedure Diagnosis Wound #3: Same as Pre-Procedure Plan Follow-up Appointments: Return Appointment in 1 week. - Friday Dressing Change Frequency: Wound #3 Right,Lateral Lower Leg: Do not change entire dressing for one week. Wound #4 Left,Medial Foot: Change Dressing every other day. - only to left medial foot wound Wound #5 Left,Lateral Lower Leg: Do not change entire dressing for one week. Skin Barriers/Peri-Wound Care: Moisturizing lotion TCA Cream or Ointment - mixed with lotion to leg, TCA over wound bed as well Wound Cleansing: May shower with protection. - cast protector Primary Wound Dressing: Wound #3 Right,Lateral Lower Leg: Endoform - moisten with saline Wound #4 Left,Medial Foot: Endoform - moisten with saline Wound #5 Left,Lateral Lower Leg: Iodoflex Secondary Dressing: Wound #3 Right,Lateral Lower Leg: Dry Gauze Wound #5 Left,Lateral Lower Leg: Dry Gauze Wound #4 Left,Medial Foot: Foam - or tape Dry Gauze Edema Control: 3 Layer Compression System - Bilateral Avoid standing for long periods of time Elevate legs to the  level of the heart or above for 30 minutes daily and/or when sitting, a frequency of: - throughout the day. Exercise regularly Support Garment 30-40 mm/Hg pressure to: - farrow wrap 4000 for to left leg Off-Loading: Open toe surgical shoe to: - left ordered were: Venous Reflux Studies - Bilateral Venous Reflux Studies, Wounds to Right and Left Lateral Lower Legs 1. TCA to both wound areas. 2. On the original wound we continue with endoform under 3 layer compression 3. Left bunion wound continue with endoform 4. The new wound on the left side is going to need further debridement we used Iodoflex under 3 layer compression. 5. She needs venous reflux studies she obviously has severe venous hypertension the tissue in her lower ankle areas is tightly fibrotic. 6. I am uncertain why the area on the MTP on the left is callused. She swears up and down to me that this is not rubbing on any wound area but it certainly looks like it Electronic Signature(s) Signed: 03/21/2020 11:39:47 AM By: Linton Ham MD Entered By: Linton Ham on 03/21/2020 11:39:47 -------------------------------------------------------------------------------- SuperBill Details Patient Name: Date of Service: [name] 03/21/2020 Medical Record [id number] Patient Account Number: [id number] Date of Birth/Sex: Treating RN: [birth year] (86 y.o. [name] Primary Care Provider: Lajean Manes T Other Clinician: Referring Provider: Treating Provider/Extender:Mesha Schamberger, Secundino Ginger, Hal T Weeks in Treatment: 13 Diagnosis Coding ICD-10 Codes Code Description I87.321 Chronic venous hypertension (idiopathic) with inflammation of right lower extremity L97.812 Non-pressure chronic ulcer of other part of right lower leg with fat layer exposed L97.828 Non-pressure chronic ulcer of other part of left lower leg with other specified severity I87.322 Chronic venous hypertension (idiopathic) with inflammation  of left lower  extremity L97.522 Non-pressure chronic ulcer of other part of left foot with fat layer exposed Facility Procedures The patient participates with Medicare or their insurance follows the Medicare Facility Guidelines: CPT4 Code Description Modifier Quantity [id number] - DEB SUBQ TISSUE 20 SQ CM/< 1 ICD-10 Diagnosis Description L97.828 Non-pressure chronic ulcer of  other part of left lower leg with other specified severity L97.522 Non-pressure chronic ulcer of other part of left foot with fat layer exposed Physician Procedures CPT4 Code Description: [id number] - WC PHYS SUBQ TISS 20 SQ CM ICD-10 Diagnosis Description L97.828 Non-pressure chronic ulcer of other part of left lower leg wit severity L97.522 Non-pressure chronic ulcer of other part of left foot with fat Modifier: h other specified layer exposed Quantity: 1 Electronic Signature(s) Signed: 03/24/2020 9:07:11 AM By: Linton Ham MD Entered By: Linton Ham on 03/21/2020 11:40:11

## 2020-03-26 NOTE — Progress Notes (Signed)
MATRICIA, BEGNAUD ([id number]) Visit Report for 02/14/2020 Arrival Information Details Patient Name: Date of Service: STERLING, UCCI 02/14/2020 9:00 AM Medical Record [id number] Patient Account Number: [id number] Date of Birth/Sex: Treating RN: 02/11/1933 (86 y.o. Helene Shoe, Meta.Reding Primary Care Lakira Ogando: Lajean Manes T Other Clinician: Referring Erland Vivas: Treating Curry Dulski/Extender:Robson, Secundino Ginger, Hal T Weeks in Treatment: 8 Visit Information History Since Last Visit Added or deleted any medications: No Patient Arrived: Ambulatory Any new allergies or adverse reactions: No Arrival Time: 09:05 Had a fall or experienced change in No Accompanied By: self activities of daily living that may affect Transfer Assistance: None risk of falls: Patient Identification Verified: Yes Signs or symptoms of abuse/neglect since last No Secondary Verification Process Yes visito Completed: Hospitalized since last visit: No Patient Requires Transmission-Based No Implantable device outside of the clinic excluding No Precautions: cellular tissue based products placed in the center Patient Has Alerts: Yes since last visit: Patient Alerts: Patient on Blood Has Dressing in Place as Prescribed: Yes Thinner Pain Present Now: No Right ABI:0.99 Electronic Signature(s) Signed: 03/26/2020 9:21:08 AM By: Sandre Kitty Entered By: Sandre Kitty on 02/14/2020 09:06:04 -------------------------------------------------------------------------------- Compression Therapy Details Patient Name: Date of Service: [name] 02/14/2020 9:00 AM Medical Record [id number] Patient Account Number: [id number] Date of Birth/Sex: Treating RN: August 08, 1933 (86 y.o. [name] Primary Care Farouk Vivero: Patrina Levering Other Clinician: Referring Artis Buechele: Treating Ileane Sando/Extender:Robson, Secundino Ginger, Hal T Weeks in Treatment: 8 Compression Therapy Performed for Wound Wound #3  Right,Lateral Lower Leg Assessment: Performed By: Clinician [name], RN Compression Type: Three Layer Pre Treatment ABI: 1 Post Procedure Diagnosis Same as Pre-procedure Electronic Signature(s) Signed: 02/14/2020 6:05:23 PM By: Deon Pilling Entered By: Deon Pilling on 02/14/2020 09:51:51 -------------------------------------------------------------------------------- Encounter Discharge Information Details Patient Name: Date of Service: [name] 02/14/2020 9:00 AM Medical Record [id number] Patient Account Number: [id number] Date of Birth/Sex: Treating RN: 07/14/1933 (86 y.o. [name] Primary Care Sarena Jezek: Patrina Levering Other Clinician: Referring Yumna Ebers: Treating Tameka Hoiland/Extender:Robson, Secundino Ginger, Junius Creamer in Treatment: 8 Encounter Discharge Information Items Discharge Condition: Stable Ambulatory Status: Ambulatory Discharge Destination: Home Transportation: Private Auto Accompanied By: self Schedule Follow-up Appointment: Yes Clinical Summary of Care: Patient Declined Electronic Signature(s) Signed: 02/18/2020 5:31:36 PM By: Kela Millin Entered By: Kela Millin on 02/14/2020 10:16:57 -------------------------------------------------------------------------------- Lower Extremity Assessment Details Patient Name: Date of Service: [name] 02/14/2020 9:00 AM Medical Record [id number] Patient Account Number: [id number] Date of Birth/Sex: Treating RN: [birth year] (86 y.o. [name] Primary Care Burnell Matlin: Lajean Manes T Other Clinician: Referring Arthor Gorter: Treating Shaine Newmark/Extender:Robson, Secundino Ginger, Hal T Weeks in Treatment: 8 Edema Assessment Assessed: [Left: No] [Right: No] Edema: [Left: No] [Right: No] Calf Left: Right: Point of Measurement: 42 cm From Medial Instep 33 cm 31.5 cm Ankle Left: Right: Point of Measurement: 13 cm From Medial Instep 21 cm 20 cm Vascular  Assessment Pulses: Dorsalis Pedis Palpable: [Left:Yes] [Right:Yes] Electronic Signature(s) Signed: 02/18/2020 5:31:36 PM By: Kela Millin Entered By: Kela Millin on 02/14/2020 09:24:26 -------------------------------------------------------------------------------- Multi Wound Chart Details Patient Name: Date of Service: [name] 02/14/2020 9:00 AM Medical Record [id number] Patient Account Number: [id number] Date of Birth/Sex: Treating RN: 1933-03-29 (86 y.o. [name] Primary Care Janylah Belgrave: Lajean Manes T Other Clinician: Referring Kalya Troeger: Treating Evanna Washinton/Extender:Robson, Secundino Ginger, Hal T Weeks in Treatment: 8 Vital Signs Height(in): 69 Pulse(bpm): 47 Weight(lbs): 163 Blood Pressure(mmHg): 112/64 Body Mass Index(BMI): 24 Temperature(F): 98.0 Respiratory 16 Rate(breaths/min): Photos: [3:No Photos] [4:No Photos] [N/A:N/A] Wound Location: [3:Right Lower  Leg - Lateral Left Foot - Medial] [N/A:N/A] Wounding Event: [3:Gradually Appeared] [4:Shear/Friction] [N/A:N/A] Primary Etiology: [3:Venous Leg Ulcer] [4:Pressure Ulcer] [N/A:N/A] Comorbid History: [3:Cataracts, Anemia, Arrhythmia, Congestive Heart Failure, Hypertension, Heart Failure, Hypertension, Peripheral Venous Disease, Peripheral Venous Disease, Received Radiation] [4:Cataracts, Anemia, Arrhythmia, Congestive Received  Radiation] [N/A:N/A] Date Acquired: [3:11/19/2019] [4:01/19/2020] [N/A:N/A] Weeks of Treatment: [3:8] [4:2] [N/A:N/A] Wound Status: [3:Open] [4:Open] [N/A:N/A] Measurements L x W x D 1x0.6x0.2 [4:0.4x0.5x0.1] [N/A:N/A] (cm) Area (cm) : [3:0.471] [4:0.157] [N/A:N/A] Volume (cm) : [3:0.094] [4:0.016] [N/A:N/A] % Reduction in Area: [3:14.40%] [4:-149.20%] [N/A:N/A] % Reduction in Volume: [3:-70.90%] [4:-166.70%] [N/A:N/A] Classification: [3:Full Thickness Without Exposed Support Structures] [4:Category/Stage II] [N/A:N/A] Exudate Amount: [3:Medium] [4:Small]  [N/A:N/A] Exudate Type: [3:Serous] [4:Serous] [N/A:N/A] Exudate Color: [3:amber] [4:amber] [N/A:N/A] Wound Margin: [3:Flat and Intact] [4:Flat and Intact] [N/A:N/A] Granulation Amount: [3:Medium (34-66%)] [4:Medium (34-66%)] [N/A:N/A] Granulation Quality: [3:Red] [4:Pink, Pale] [N/A:N/A] Necrotic Amount: [3:Medium (34-66%)] [4:Medium (34-66%)] [N/A:N/A] Exposed Structures: [3:Fat Layer (Subcutaneous Fat Layer (Subcutaneous Tissue) Exposed: Yes Fascia: No Tendon: No Muscle: No Joint: No Bone: No] [4:Tissue) Exposed: Yes Fascia: No Tendon: No Muscle: No Joint: No Bone: No] [N/A:N/A] Epithelialization: [3:Small (1-33%) Compression Therapy] [4:None N/A] [N/A:N/A N/A] Treatment Notes Electronic Signature(s) Signed: 02/14/2020 5:41:03 PM By: Linton Ham MD Signed: 02/14/2020 6:05:23 PM By: Deon Pilling Entered By: Linton Ham on 02/14/2020 09:56:06 -------------------------------------------------------------------------------- Multi-Disciplinary Care Plan Details Patient Name: Date of Service: [name]. 02/14/2020 9:00 AM Medical Record [id number] Patient Account Number: [id number] Date of Birth/Sex: Treating RN: 17-Aug-1933 (86 y.o. [name] Primary Care July Linam: Patrina Levering Other Clinician: Referring Katelinn Justice: Treating Krislyn Donnan/Extender:Robson, Secundino Ginger, Hal T Weeks in Treatment: 8 Active Inactive Abuse / Safety / Falls / Self Care Management Nursing Diagnoses: Potential for falls Goals: Patient/caregiver will verbalize understanding of skin care regimen Date Initiated: 12/20/2019 Date Inactivated: 02/14/2020 Target Resolution Date: 02/22/2020 Goal Status: Met Patient/caregiver will verbalize/demonstrate understanding of what to do in case of emergency Date Initiated: 12/20/2019 Target Resolution Date: 03/07/2020 Goal Status: Active Interventions: Assess fall risk on admission and as needed Provide education on fall prevention Notes: Pain, Acute  or Chronic Nursing Diagnoses: Pain, acute or chronic: actual or potential Potential alteration in comfort, pain Goals: Patient will verbalize adequate pain control and receive pain control interventions during procedures as needed Date Initiated: 12/20/2019 Date Inactivated: 02/14/2020 Target Resolution Date: 02/22/2020 Goal Status: Met Patient/caregiver will verbalize comfort level met Date Initiated: 12/20/2019 Target Resolution Date: 03/14/2020 Goal Status: Active Interventions: Complete pain assessment as per visit requirements Provide education on pain management Notes: Wound/Skin Impairment Nursing Diagnoses: Knowledge deficit related to ulceration/compromised skin integrity Goals: Patient/caregiver will verbalize understanding of skin care regimen Date Initiated: 12/20/2019 Target Resolution Date: 02/22/2020 Goal Status: Active Interventions: Assess patient/caregiver ability to perform ulcer/skin care regimen upon admission and as needed Provide education on ulcer and skin care Treatment Activities: Skin care regimen initiated : 12/20/2019 Topical wound management initiated : 12/20/2019 Notes: Electronic Signature(s) Signed: 02/14/2020 6:05:23 PM By: Deon Pilling Entered By: Deon Pilling on 02/14/2020 09:24:51 -------------------------------------------------------------------------------- Pain Assessment Details Patient Name: Date of Service: [name] 02/14/2020 9:00 AM Medical Record [id number] Patient Account Number: [id number] Date of Birth/Sex: Treating RN: [birth year] (86 y.o. [name] Primary Care Mackenize Delgadillo: Patrina Levering Other Clinician: Referring Kimori Tartaglia: Treating Aubrielle Stroud/Extender:Robson, Secundino Ginger, Hal T Weeks in Treatment: 8 Active Problems Location of Pain Severity and Description of Pain Patient Has Paino No Site Locations Pain Management and Medication Current Pain Management: Electronic Signature(s) Signed: 02/14/2020 6:05:23  PM By:  Deon Pilling Signed: 03/26/2020 9:21:08 AM By: Sandre Kitty Entered By: Sandre Kitty on 02/14/2020 09:08:38 -------------------------------------------------------------------------------- Patient/Caregiver Education Details Patient Name: Date of Service: Mcgregory, Ladine J. 3/4/2021andnbsp9:00 AM Medical Record [id number] Patient Account Number: [id number] Date of Birth/Gender: 06-12-1933 (87 y.o. F) Treating RN: Deon Pilling Primary Care Physician: Patrina Levering Other Clinician: Referring Physician: Treating Physician/Extender:Robson, Secundino Ginger, Junius Creamer in Treatment: 8 Education Assessment Education Provided To: Patient Education Topics Provided Wound/Skin Impairment: Handouts: Skin Care Do's and Dont's Methods: Explain/Verbal Responses: Reinforcements needed Electronic Signature(s) Signed: 02/14/2020 6:05:23 PM By: Deon Pilling Entered By: Deon Pilling on 02/14/2020 09:25:03 -------------------------------------------------------------------------------- Wound Assessment Details Patient Name: Date of Service: [name] 02/14/2020 9:00 AM Medical Record [id number] Patient Account Number: [id number] Date of Birth/Sex: Treating RN: May 14, 1933 (86 y.o. [name] Primary Care Behr Cislo: Lajean Manes T Other Clinician: Referring Malayiah Mcbrayer: Treating Judythe Postema/Extender:Robson, Secundino Ginger, Hal T Weeks in Treatment: 8 Wound Status Wound Number: 3 Primary Venous Leg Ulcer Etiology: Wound Location: Right Lower Leg - Lateral Wound Open Wounding Event: Gradually Appeared Status: Date Acquired: 11/19/2019 Comorbid Cataracts, Anemia, Arrhythmia, Congestive Weeks Of Treatment: 8 History: Heart Failure, Hypertension, Peripheral Clustered Wound: No Venous Disease, Received Radiation Photos Wound Measurements Length: (cm) 1 % Reduct Width: (cm) 0.6 % Reduct Depth: (cm) 0.2 Epitheli Area: (cm) 0.471 Tunneli Volume: (cm)  0.094 Undermi Wound Description Full Thickness Without Exposed Support Foul Odo Classification: Structures Slough/F Wound Flat and Intact Margin: Exudate Medium Amount: Exudate Serous Type: Exudate amber Color: Wound Bed Granulation Amount: Medium (34-66%) Granulation Quality: Red Fascia E Necrotic Amount: Medium (34-66%) Fat Laye Necrotic Quality: Adherent Slough Tendon E Muscle E Joint Ex Bone Exp r After Cleansing: No ibrino Yes Exposed Structure xposed: No r (Subcutaneous Tissue) Exposed: Yes xposed: No xposed: No posed: No osed: No ion in Area: 14.4% ion in Volume: -70.9% alization: Small (1-33%) ng: No ning: No Electronic Signature(s) Signed: 02/15/2020 4:01:29 PM By: Mikeal Hawthorne EMT/HBOT Signed: 02/15/2020 5:34:19 PM By: Deon Pilling Previous Signature: 02/14/2020 6:05:23 PM Version By: Deon Pilling Entered By: Mikeal Hawthorne on 02/15/2020 13:23:04 -------------------------------------------------------------------------------- Wound Assessment Details Patient Name: Date of Service: [name] 02/14/2020 9:00 AM Medical Record [id number] Patient Account Number: [id number] Date of Birth/Sex: Treating RN: 01/07/1933 (86 y.o. [name] Primary Care Dorse Locy: Lajean Manes T Other Clinician: Referring Ramzi Brathwaite: Treating Hershal Eriksson/Extender:Robson, Secundino Ginger, Hal T Weeks in Treatment: 8 Wound Status Wound Number: 4 Primary Pressure Ulcer Etiology: Wound Location: Left Foot - Medial Wound Open Wounding Event: Shear/Friction Status: Date Acquired: 01/19/2020 Comorbid Cataracts, Anemia, Arrhythmia, Congestive Weeks Of Treatment: 2 History: Heart Failure, Hypertension, Peripheral Clustered Wound: No Venous Disease, Received Radiation Photos Wound Measurements Length: (cm) 0.4 % Reducti Width: (cm) 0.5 % Reducti Depth: (cm) 0.1 Epithelia Area: (cm) 0.157 Tunnelin Volume: (cm) 0.016 Undermin Wound Description Classification:  Category/Stage II Wound Margin: Flat and Intact Exudate Amount: Small Exudate Type: Serous Exudate Color: amber Wound Bed Granulation Amount: Medium (34-66%) Granulation Quality: Pink, Pale Necrotic Amount: Medium (34-66%) Necrotic Quality: Adherent Slough Foul Odor After Cleansing: No Slough/Fibrino Yes Exposed Structure Fascia Exposed: No Fat Layer (Subcutaneous Tissue) Exposed: Yes Tendon Exposed: No Muscle Exposed: No Joint Exposed: No Bone Exposed: No on in Area: -149.2% on in Volume: -166.7% lization: None g: No ing: No Electronic Signature(s) Signed: 02/15/2020 4:01:29 PM By: Mikeal Hawthorne EMT/HBOT Signed: 02/15/2020 5:34:19 PM By: Deon Pilling Previous Signature: 02/14/2020 6:05:23 PM Version By: Deon Pilling Entered By: Mikeal Hawthorne on 02/15/2020 13:22:20 -------------------------------------------------------------------------------- Vitals Details  Patient Name: Date of Service: ROLANDA, CAMPA 02/14/2020 9:00 AM Medical Record [id number] Patient Account Number: [id number] Date of Birth/Sex: Treating RN: [birth year] (86 y.o. [name] Primary Care Tahari Clabaugh: Lajean Manes T Other Clinician: Referring Bannon Giammarco: Treating Narelle Schoening/Extender:Robson, Secundino Ginger, Hal T Weeks in Treatment: 8 Vital Signs Time Taken: 09:08 Temperature (F): 98.0 Height (in): 69 Pulse (bpm): 47 Weight (lbs): 163 Respiratory Rate (breaths/min): 16 Body Mass Index (BMI): 24.1 Blood Pressure (mmHg): 112/64 Reference Range: 80 - 120 mg / dl Electronic Signature(s) Signed: 03/26/2020 9:21:08 AM By: Sandre Kitty Entered By: Sandre Kitty on 02/14/2020 09:08:33

## 2020-03-26 NOTE — Progress Notes (Signed)
[name], [name] ([id number]) Visit Report for 12/28/2019 Arrival Information Details Patient Name: Date of Service: [name] 12/28/2019 9:30 AM Medical Record [id number] Patient Account Number: [id number] Date of Birth/Sex: Treating RN: January 24, 1933 (86 y.o. F) Primary Care Bryanne Riquelme: Lajean Manes T Other Clinician: Referring Stephane Niemann: Treating Kinzleigh Kandler/Extender:Robson, Secundino Ginger, Hal T Weeks in Treatment: 1 Visit Information History Since Last Visit Added or deleted any medications: No Patient Arrived: Ambulatory Any new allergies or adverse reactions: No Arrival Time: 10:04 Had a fall or experienced change in No Accompanied By: self activities of daily living that may affect Transfer Assistance: None risk of falls: Patient Identification Verified: Yes Signs or symptoms of abuse/neglect since last No Secondary Verification Process Yes visito Completed: Hospitalized since last visit: No Patient Has Alerts: Yes Implantable device outside of the clinic excluding No Patient Alerts: Patient on Blood cellular tissue based products placed in the center Thinner since last visit: Right ABI:0.99 Has Dressing in Place as Prescribed: Yes Pain Present Now: No Electronic Signature(s) Signed: 03/26/2020 9:24:15 AM By: Sandre Kitty Entered By: Sandre Kitty on 12/28/2019 10:06:11 -------------------------------------------------------------------------------- Encounter Discharge Information Details Patient Name: Date of Service: [name] 12/28/2019 9:30 AM Medical Record [id number] Patient Account Number: [id number] Date of Birth/Sex: Treating RN: 05-17-1933 (86 y.o. [name] Primary Care Martasia Talamante: Lajean Manes T Other Clinician: Referring Rhondalyn Clingan: Treating Shiann Kam/Extender:Robson, Secundino Ginger, Hal T Weeks in Treatment: 1 Encounter Discharge Information Items Post Procedure Vitals Discharge Condition: Stable Temperature  (F): 97.6 Ambulatory Status: Ambulatory Pulse (bpm): 59 Discharge Destination: Home Respiratory Rate (breaths/min): 18 Transportation: Private Auto Blood Pressure (mmHg): 148/83 Accompanied By: self Schedule Follow-up Appointment: Yes Clinical Summary of Care: Patient Declined Electronic Signature(s) Signed: 12/28/2019 6:26:16 PM By: [name] RN, BSN Entered By: [name] on 12/28/2019 17:52:50 -------------------------------------------------------------------------------- Lower Extremity Assessment Details Patient Name: Date of Service: [name] 12/28/2019 9:30 AM Medical Record [id number] Patient Account Number: [id number] Date of Birth/Sex: Treating RN: 04-19-1933 (86 y.o. [name] Primary Care Kobe Jansma: Lajean Manes T Other Clinician: Referring Monroe Qin: Treating Keiandre Cygan/Extender:Robson, Secundino Ginger, Hal T Weeks in Treatment: 1 Edema Assessment Assessed: [Left: No] [Right: No] Edema: [Left: N] [Right: o] Calf Left: Right: Point of Measurement: 42 cm From Medial Instep cm 31.8 cm Ankle Left: Right: Point of Measurement: 13 cm From Medial Instep cm 21 cm Vascular Assessment Pulses: Dorsalis Pedis Palpable: [Right:Yes] Electronic Signature(s) Signed: 12/31/2019 6:02:32 PM By: [name] RN, BSN Entered By: [name] on 12/28/2019 10:27:10 -------------------------------------------------------------------------------- Multi Wound Chart Details Patient Name: Date of Service: [name] 12/28/2019 9:30 AM Medical Record [id number] Patient Account Number: [id number] Date of Birth/Sex: Treating RN: 1933/09/20 (86 y.o. F) Primary Care Preston Weill: Lajean Manes T Other Clinician: Referring Dreya Buhrman: Treating Delailah Spieth/Extender:Robson, Secundino Ginger, Hal T Weeks in Treatment: 1 Vital Signs Height(in): 69 Pulse(bpm): 59 Weight(lbs): 163 Blood Pressure(mmHg): 148/83 Body Mass Index(BMI):  24 Temperature(F): 97.6 Respiratory 19 Rate(breaths/min): Photos: [3:No Photos] [N/A:N/A] Wound Location: [3:Right Lower Leg - Lateral N/A] Wounding Event: [3:Gradually Appeared] [N/A:N/A] Primary Etiology: [3:Venous Leg Ulcer] [N/A:N/A] Comorbid History: [3:Cataracts, Anemia, Arrhythmia, Congestive Heart Failure, Hypertension, Peripheral Venous Disease, Received Radiation] [N/A:N/A] Date Acquired: [3:11/19/2019] [N/A:N/A] Weeks of Treatment: [3:1] [N/A:N/A] Wound Status: [3:Open] [N/A:N/A] Measurements L x W x D 0.9x0.6x0.1 [N/A:N/A] (cm) Area (cm) : [3:0.424] [N/A:N/A] Volume (cm) : [3:0.042] [N/A:N/A] % Reduction in Area: [3:22.90%] [N/A:N/A] % Reduction in Volume: 23.60% [N/A:N/A] Classification: [3:Full Thickness Without Exposed Support Structures] [N/A:N/A] Exudate Amount: [3:Small] [N/A:N/A] Exudate Type: [3:Serosanguineous] [N/A:N/A] Exudate Color: [3:red,  brown] [N/A:N/A] Wound Margin: [3:Distinct, outline attached N/A] Granulation Amount: [3:Small (1-33%)] [N/A:N/A] Granulation Quality: [3:Pink] [N/A:N/A] Necrotic Amount: [3:Large (67-100%)] [N/A:N/A] Exposed Structures: [3:Fat Layer (Subcutaneous N/A Tissue) Exposed: Yes Fascia: No Tendon: No Muscle: No Joint: No Bone: No] Epithelialization: [3:Small (1-33%)] [N/A:N/A] Debridement: [3:Debridement - Excisional N/A] Pre-procedure [3:10:35] [N/A:N/A] Verification/Time Out Taken: Pain Control: [3:Lidocaine 4% Topical Solution] [N/A:N/A] Tissue Debrided: [3:Subcutaneous, Slough] [N/A:N/A] Level: [3:Skin/Subcutaneous Tissue N/A] Debridement Area (sq cm):0.72 [N/A:N/A] Instrument: [3:Curette] [N/A:N/A] Bleeding: [3:Minimum] [N/A:N/A] Hemostasis Achieved: [3:Pressure] [N/A:N/A] Procedural Pain: [3:8] [N/A:N/A] Post Procedural Pain: [3:5] [N/A:N/A] Debridement Treatment [3:Procedure was tolerated] [N/A:N/A] Response: [3:well] Post Debridement [3:0.9x0.8x0.1] [N/A:N/A] Measurements L x W x D (cm) Post Debridement  [3:0.057] [N/A:N/A] Volume: (cm) Procedures Performed: [3:Debridement] [N/A:N/A] Treatment Notes Electronic Signature(s) Signed: 12/28/2019 6:20:19 PM By: Linton Ham MD Entered By: Linton Ham on 12/28/2019 10:44:39 -------------------------------------------------------------------------------- Multi-Disciplinary Care Plan Details Patient Name: Date of Service: [name]. 12/28/2019 9:30 AM Medical Record [id number] Patient Account Number: [id number] Date of Birth/Sex: Treating RN: [birth year] (86 y.o. [name] Primary Care Kerry-Anne Mezo: Lajean Manes T Other Clinician: Referring Shaquila Sigman: Treating Katina Remick/Extender:Robson, Secundino Ginger, Hal T Weeks in Treatment: 1 Active Inactive Abuse / Safety / Falls / Self Care Management Nursing Diagnoses: Potential for falls Goals: Patient/caregiver will verbalize understanding of skin care regimen Date Initiated: 12/20/2019 Target Resolution Date: 01/18/2020 Goal Status: Active Patient/caregiver will verbalize/demonstrate understanding of what to do in case of emergency Date Initiated: 12/20/2019 Target Resolution Date: 01/18/2020 Goal Status: Active Interventions: Assess fall risk on admission and as needed Provide education on fall prevention Notes: Pain, Acute or Chronic Nursing Diagnoses: Pain, acute or chronic: actual or potential Potential alteration in comfort, pain Goals: Patient will verbalize adequate pain control and receive pain control interventions during procedures as needed Date Initiated: 12/20/2019 Target Resolution Date: 01/18/2020 Goal Status: Active Patient/caregiver will verbalize comfort level met Date Initiated: 12/20/2019 Target Resolution Date: 01/18/2020 Goal Status: Active Interventions: Complete pain assessment as per visit requirements Provide education on pain management Notes: Wound/Skin Impairment Nursing Diagnoses: Knowledge deficit related to ulceration/compromised skin  integrity Goals: Patient/caregiver will verbalize understanding of skin care regimen Date Initiated: 12/20/2019 Target Resolution Date: 01/11/2020 Goal Status: Active Interventions: Assess patient/caregiver ability to perform ulcer/skin care regimen upon admission and as needed Provide education on ulcer and skin care Treatment Activities: Skin care regimen initiated : 12/20/2019 Topical wound management initiated : 12/20/2019 Notes: Electronic Signature(s) Signed: 12/28/2019 6:26:16 PM By: [name] RN, BSN Entered By: [name] on 12/28/2019 10:34:44 -------------------------------------------------------------------------------- Pain Assessment Details Patient Name: Date of Service: [name] 12/28/2019 9:30 AM Medical Record [id number] Patient Account Number: [id number] Date of Birth/Sex: Treating RN: 05/16/1933 (86 y.o. F) Primary Care Romonda Parker: Patrina Levering Other Clinician: Referring Elysa Womac: Treating Jackquline Branca/Extender:Robson, Secundino Ginger, Hal T Weeks in Treatment: 1 Active Problems Location of Pain Severity and Description of Pain Patient Has Paino No Site Locations Pain Management and Medication Current Pain Management: Electronic Signature(s) Signed: 03/26/2020 9:24:15 AM By: Sandre Kitty Entered By: Sandre Kitty on 12/28/2019 10:06:34 -------------------------------------------------------------------------------- Patient/Caregiver Education Details Patient Name: Date of Service: [name] 1/15/2021andnbsp9:30 AM Medical Record [id number] Patient Account Number: [id number] Date of Birth/Gender: Dec 18, 1932 (86 y.o. F) Treating RN: [name] Primary Care Physician: Patrina Levering Other Clinician: Referring Physician: Treating Physician/Extender:Robson, Secundino Ginger, Junius Creamer in Treatment: 1 Education Assessment Education Provided To: Patient Education Topics Provided Venous: Methods:  Explain/Verbal Responses: Reinforcements needed, State content correctly Wound/Skin Impairment: Methods: Explain/Verbal Responses: Reinforcements needed, State content  correctly Electronic Signature(s) Signed: 12/28/2019 6:26:16 PM By: [name] RN, BSN Entered By: [name] on 12/28/2019 10:35:01 -------------------------------------------------------------------------------- Wound Assessment Details Patient Name: Date of Service: [name] 12/28/2019 9:30 AM Medical Record [id number] Patient Account Number: [id number] Date of Birth/Sex: Treating RN: 13-Aug-1933 (86 y.o. F) Primary Care Lucina Betty: Lajean Manes T Other Clinician: Referring Aryssa Rosamond: Treating Katrina Daddona/Extender:Robson, Secundino Ginger, Hal T Weeks in Treatment: 1 Wound Status Wound Number: [id number] Primary Venous Leg Ulcer Etiology: Wound Location: Right Lower Leg - Lateral Wound Open Wounding Event: Gradually Appeared Status: Date Acquired: 11/19/2019 Comorbid Cataracts, Anemia, Arrhythmia, Congestive Weeks Of Treatment: 1 History: Heart Failure, Hypertension, Peripheral Clustered Wound: No Venous Disease, Received Radiation Photos Wound Measurements Length: (cm) 0.9 % Reduct Width: (cm) 0.6 % Reduct Depth: (cm) 0.1 Epitheli Area: (cm) 0.424 Tunneli Volume: (cm) 0.042 Undermi Wound Description Classification: Full Thickness Without Exposed Support Foul Od Structures Slough/ Wound Distinct, outline attached Margin: Exudate Small Amount: Exudate Serosanguineous Type: Exudate red, brown Color: Wound Bed Granulation Amount: Small (1-33%) Granulation Quality: Pink Fascia E Necrotic Amount: Large (67-100%) Fat Laye Necrotic Quality: Adherent Slough Tendon Expo Muscle Expo Joint Expos Bone Expose or After Cleansing: No Fibrino Yes Exposed Structure xposed: No r (Subcutaneous Tissue) Exposed: Yes sed: No sed: No ed: No d: No ion in Area: 22.9% ion in Volume:  23.6% alization: Small (1-33%) ng: No ning: No Electronic Signature(s) Signed: 01/01/2020 4:37:08 PM By: Mikeal Hawthorne EMT/HBOT Entered By: Mikeal Hawthorne on 12/31/2019 14:28:46 -------------------------------------------------------------------------------- [place] Details Patient Name: Date of Service: [name] 12/28/2019 9:30 AM Medical Record [id number] Patient Account Number: [id number] Date of Birth/Sex: Treating RN: 03/14/1933 (86 y.o. F) Primary Care Rigby Swamy: Lajean Manes T Other Clinician: Referring Richetta Cubillos: Treating Hayley Horn/Extender:Robson, Secundino Ginger, Hal T Weeks in Treatment: 1 Vital Signs Time Taken: 10:06 Temperature (F): 97.6 Height (in): 69 Pulse (bpm): 59 Weight (lbs): 163 Respiratory Rate (breaths/min): 19 Body Mass Index (BMI): 24.1 Blood Pressure (mmHg): 148/83 Reference Range: 80 - 120 mg / dl Electronic Signature(s) Signed: 03/26/2020 9:24:15 AM By: Sandre Kitty Entered By: Sandre Kitty on 12/28/2019 10:06:30

## 2020-03-26 NOTE — Progress Notes (Signed)
[name], [name] ([id number]) Visit Report for 02/08/2020 Arrival Information Details Patient Name: Date of Service: [name], [name] 02/08/2020 9:15 AM Medical Record [id number] Patient Account Number: [id number] Date of Birth/Sex: Treating RN: 1933-05-21 (86 y.o. [name] Primary Care Kanden Carey: Lajean Manes T Other Clinician: Referring Aaryan Essman: Treating Ludmila Ebarb/Extender:Robson, Secundino Ginger, Hal T Weeks in Treatment: 7 Visit Information History Since Last Visit Added or deleted any medications: No Patient Arrived: Ambulatory Any new allergies or adverse reactions: No Arrival Time: 09:44 Had a fall or experienced change in No Accompanied By: self activities of daily living that may affect Transfer Assistance: None risk of falls: Patient Identification Verified: Yes Signs or symptoms of abuse/neglect since last No Secondary Verification Process Yes visito Completed: Hospitalized since last visit: No Patient Requires Transmission-Based No Implantable device outside of the clinic excluding No Precautions: cellular tissue based products placed in the center Patient Has Alerts: Yes since last visit: Patient Alerts: Patient on Blood Has Dressing in Place as Prescribed: Yes Thinner Pain Present Now: No Right ABI:0.99 Electronic Signature(s) Signed: 03/26/2020 9:23:26 AM By: Sandre Kitty Entered By: Sandre Kitty on 02/08/2020 09:44:42 -------------------------------------------------------------------------------- Compression Therapy Details Patient Name: Date of Service: [name], Tracy J. 02/08/2020 9:15 AM Medical Record [id number] Patient Account Number: [id number] Date of Birth/Sex: Treating RN: 1933-08-03 (86 y.o. [name] Primary Care Claud Gowan: Patrina Levering Other Clinician: Referring Nashaun Hillmer: Treating Juliana Boling/Extender:Robson, Secundino Ginger, Hal T Weeks in Treatment: 7 Compression Therapy Performed for Wound  Wound #3 Right,Lateral Lower Leg Assessment: Performed By: Clinician Deon Pilling, RN Compression Type: Three Layer Post Procedure Diagnosis Same as Pre-procedure Electronic Signature(s) Signed: 02/08/2020 5:17:01 PM By: Kela Millin Entered By: Kela Millin on 02/08/2020 10:15:43 -------------------------------------------------------------------------------- Encounter Discharge Information Details Patient Name: Date of Service: [name] 02/08/2020 9:15 AM Medical Record [id number] Patient Account Number: [id number] Date of Birth/Sex: Treating RN: [birth year] (86 y.o. [name] Primary Care Marnesha Gagen: Patrina Levering Other Clinician: Referring Tallie Hevia: Treating Neomia Herbel/Extender:Robson, Secundino Ginger, Hal T Weeks in Treatment: 7 Encounter Discharge Information Items Post Procedure Vitals Discharge Condition: Stable Temperature (F): 98 Ambulatory Status: Ambulatory Pulse (bpm): 101 Discharge Destination: Home Respiratory Rate (breaths/min): 16 Transportation: Private Auto Blood Pressure (mmHg): 146/89 Accompanied By: self Schedule Follow-up Appointment: Yes Clinical Summary of Care: Electronic Signature(s) Signed: 02/08/2020 4:47:37 PM By: Deon Pilling Entered By: Deon Pilling on 02/08/2020 10:48:05 -------------------------------------------------------------------------------- Lower Extremity Assessment Details Patient Name: Date of Service: [name], [name] 02/08/2020 9:15 AM Medical Record [id number] Patient Account Number: [id number] Date of Birth/Sex: Treating RN: Jun 21, 1933 (86 y.o. [name] Primary Care Kasey Ewings: Lajean Manes T Other Clinician: Referring Tanecia Mccay: Treating Ajahnae Rathgeber/Extender:Robson, Secundino Ginger, Hal T Weeks in Treatment: 7 Edema Assessment Assessed: [Left: Yes] [Right: Yes] Edema: [Left: Yes] [Right: No] Calf Left: Right: Point of Measurement: 42 cm From Medial Instep 33.5 cm 31  cm Ankle Left: Right: Point of Measurement: 13 cm From Medial Instep 21 cm 20 cm Vascular Assessment Pulses: Dorsalis Pedis Palpable: [Left:Yes] [Right:Yes] Electronic Signature(s) Signed: 02/08/2020 4:47:37 PM By: Deon Pilling Entered By: Deon Pilling on 02/08/2020 09:51:51 -------------------------------------------------------------------------------- Multi Wound Chart Details Patient Name: Date of Service: [name] 02/08/2020 9:15 AM Medical Record [id number] Patient Account Number: [id number] Date of Birth/Sex: Treating RN: 1933-09-06 (86 y.o. [name] Primary Care Daylan Juhnke: Lajean Manes T Other Clinician: Referring Senaida Chilcote: Treating Lygia Olaes/Extender:Robson, Secundino Ginger, Hal T Weeks in Treatment: 7 Vital Signs Height(in): 69 Pulse(bpm): 101 Weight(lbs): 163 Blood Pressure(mmHg): 146/89 Body Mass Index(BMI): 24 Temperature(F): 98.0 Respiratory 16  Rate(breaths/min): Photos: [3:No Photos] [4:No Photos] [N/A:N/A] Wound Location: [3:Right Lower Leg - Lateral Left Foot - Medial] [N/A:N/A] Wounding Event: [3:Gradually Appeared] [4:Shear/Friction] [N/A:N/A] Primary Etiology: [3:Venous Leg Ulcer] [4:Pressure Ulcer] [N/A:N/A] Comorbid History: [3:Cataracts, Anemia, Arrhythmia, Congestive Heart Failure, Hypertension, Heart Failure, Hypertension, Peripheral Venous Disease, Peripheral Venous Disease, Received Radiation] [4:Cataracts, Anemia, Arrhythmia, Congestive Received  Radiation] [N/A:N/A] Date Acquired: [3:11/19/2019] [4:01/19/2020] [N/A:N/A] Weeks of Treatment: [3:7] [4:2] [N/A:N/A] Wound Status: [3:Open] [4:Open] [N/A:N/A] Measurements L x W x D 1x0.8x0.2 [4:0.7x1.2x0.2] [N/A:N/A] (cm) Area (cm) : [3:0.628] [4:0.66] [N/A:N/A] Volume (cm) : [3:0.126] [4:0.132] [N/A:N/A] % Reduction in Area: [3:-14.20%] [4:-947.60%] [N/A:N/A] % Reduction in Volume: [3:-129.10%] [4:-2100.00%] [N/A:N/A] Classification: [3:Full Thickness Without Exposed  Support Structures] [4:Category/Stage II] [N/A:N/A] Exudate Amount: [3:Medium] [4:Small] [N/A:N/A] Exudate Type: [3:Serosanguineous] [4:Serosanguineous] [N/A:N/A] Exudate Color: [3:red, brown] [4:red, brown] [N/A:N/A] Wound Margin: [3:Flat and Intact] [4:Flat and Intact] [N/A:N/A] Granulation Amount: [3:Medium (34-66%)] [4:None Present (0%)] [N/A:N/A] Granulation Quality: [3:Red] [4:N/A] [N/A:N/A] Necrotic Amount: [3:Medium (34-66%)] [4:Large (67-100%)] [N/A:N/A] Exposed Structures: [3:Fat Layer (Subcutaneous Fat Layer (Subcutaneous Tissue) Exposed: Yes Fascia: No Tendon: No Muscle: No Joint: No Bone: No] [4:Tissue) Exposed: Yes Fascia: No Tendon: No Muscle: No Joint: No Bone: No] [N/A:N/A] Epithelialization: [3:Small (1-33%)] [4:None] [N/A:N/A] Debridement: [3:Debridement - Excisional Debridement - Excisional] [N/A:N/A] Pre-procedure [3:10:15] [4:10:15] [N/A:N/A] Verification/Time Out Taken: Pain Control: [3:Other] [4:Other] [N/A:N/A] Tissue Debrided: [3:Subcutaneous, Slough] [4:Subcutaneous, Slough] [N/A:N/A] Level: [3:Skin/Subcutaneous Tissue] [4:Skin/Subcutaneous Tissue] [N/A:N/A] Debridement Area (sq cm):0.8 [4:0.84] [N/A:N/A] Instrument: [3:Curette] [4:Curette] [N/A:N/A] Bleeding: [3:Minimum] [4:Minimum] [N/A:N/A] Hemostasis Achieved: [3:Pressure] [4:Pressure] [N/A:N/A] Procedural Pain: [3:2] [4:2] [N/A:N/A] Post Procedural Pain: [3:0] [4:0] [N/A:N/A] Debridement Treatment Procedure was tolerated [4:Procedure was tolerated] [N/A:N/A] Response: [3:well] [4:well] Post Debridement [3:1x0.8x0.2] [4:0.7x1.2x0.2] [N/A:N/A] Measurements L x W x D (cm) Post Debridement [3:0.126] [4:0.132] [N/A:N/A] Volume: (cm) Post Debridement Stage: N/A [4:Category/Stage II] [N/A:N/A] Procedures Performed: Compression Therapy [3:Debridement] [4:Debridement] [N/A:N/A] Treatment Notes Electronic Signature(s) Signed: 02/08/2020 5:17:01 PM By: Kela Millin Signed: 02/08/2020 5:26:28 PM By: Linton Ham MD Entered By: Linton Ham on 02/08/2020 10:34:34 -------------------------------------------------------------------------------- Multi-Disciplinary Care Plan Details Patient Name: Date of Service: [name] 02/08/2020 9:15 AM Medical Record [id number] Patient Account Number: [id number] Date of Birth/Sex: Treating RN: 12/26/1932 (86 y.o. [name] Primary Care Mabry Tift: Other Clinician: Patrina Levering Referring Jeovany Huitron: Treating Ayvion Kavanagh/Extender:Robson, Secundino Ginger, Hal T Weeks in Treatment: 7 Active Inactive Abuse / Safety / Falls / Self Care Management Nursing Diagnoses: Potential for falls Goals: Patient/caregiver will verbalize understanding of skin care regimen Date Initiated: 12/20/2019 Target Resolution Date: 02/22/2020 Goal Status: Active Patient/caregiver will verbalize/demonstrate understanding of what to do in case of emergency Date Initiated: 12/20/2019 Target Resolution Date: 02/22/2020 Goal Status: Active Interventions: Assess fall risk on admission and as needed Provide education on fall prevention Notes: Pain, Acute or Chronic Nursing Diagnoses: Pain, acute or chronic: actual or potential Potential alteration in comfort, pain Goals: Patient will verbalize adequate pain control and receive pain control interventions during procedures as needed Date Initiated: 12/20/2019 Target Resolution Date: 02/22/2020 Goal Status: Active Patient/caregiver will verbalize comfort level met Date Initiated: 12/20/2019 Target Resolution Date: 02/22/2020 Goal Status: Active Interventions: Complete pain assessment as per visit requirements Provide education on pain management Notes: Wound/Skin Impairment Nursing Diagnoses: Knowledge deficit related to ulceration/compromised skin integrity Goals: Patient/caregiver will verbalize understanding of skin care regimen Date Initiated: 12/20/2019 Target Resolution Date: 02/22/2020 Goal Status:  Active Interventions: Assess patient/caregiver ability to perform ulcer/skin care regimen upon admission and as needed Provide education on ulcer and skin care Treatment Activities: Skin  care regimen initiated : 12/20/2019 Topical wound management initiated : 12/20/2019 Notes: Electronic Signature(s) Signed: 02/08/2020 5:17:01 PM By: Kela Millin Entered By: Kela Millin on 02/08/2020 09:31:59 -------------------------------------------------------------------------------- Pain Assessment Details Patient Name: Date of Service: [name], [name] 02/08/2020 9:15 AM Medical Record [id number] Patient Account Number: [id number] Date of Birth/Sex: Treating RN: 04/10/1933 (86 y.o. [name] Primary Care Nakeeta Sebastiani: Patrina Levering Other Clinician: Referring Luella Gardenhire: Treating Zona Pedro/Extender:Robson, Secundino Ginger, Hal T Weeks in Treatment: 7 Active Problems Location of Pain Severity and Description of Pain Patient Has Paino No Site Locations Pain Management and Medication Current Pain Management: Electronic Signature(s) Signed: 02/08/2020 5:17:01 PM By: Kela Millin Signed: 03/26/2020 9:23:26 AM By: Sandre Kitty Entered By: Sandre Kitty on 02/08/2020 09:47:37 -------------------------------------------------------------------------------- Patient/Caregiver Education Details Patient Name: Date of Service: [name] 2/26/2021andnbsp9:15 AM Medical Record [phone] Patient Account Number: [id number] Date of Birth/Gender: [birth year] (87 y.o. F) Treating RN: Kela Millin Primary Care Physician: Patrina Levering Other Clinician: Referring Physician: Treating Physician/Extender:Robson, Secundino Ginger, Junius Creamer in Treatment: 7 Education Assessment Education Provided To: Patient Education Topics Provided Pain: Methods: Explain/Verbal Responses: State content correctly Safety: Methods: Explain/Verbal Responses: State  content correctly Wound/Skin Impairment: Methods: Explain/Verbal Responses: State content correctly Electronic Signature(s) Signed: 02/08/2020 5:17:01 PM By: Kela Millin Entered By: Kela Millin on 02/08/2020 09:32:22 -------------------------------------------------------------------------------- Wound Assessment Details Patient Name: Date of Service: [name], Tracy J. 02/08/2020 9:15 AM Medical Record [id number] Patient Account Number: [id number] Date of Birth/Sex: Treating RN: 05-Oct-1933 (86 y.o. [name] Primary Care Vandy Fong: Lajean Manes T Other Clinician: Referring Cataleia Gade: Treating Jonisha Kindig/Extender:Robson, Secundino Ginger, Hal T Weeks in Treatment: 7 Wound Status Wound Number: 3 Primary Venous Leg Ulcer Etiology: Wound Location: Right Lower Leg - Lateral Wound Open Wounding Event: Gradually Appeared Status: Date Acquired: 11/19/2019 ComorbidCataracts, Anemia, Arrhythmia, Congestive Weeks Of Treatment: 7 Weeks Of Treatment: 7 History: Heart Failure, Hypertension, Peripheral Clustered Wound: No Venous Disease, Received Radiation Photos Wound Measurements Length: (cm) 1 % Reduct Width: (cm) 0.8 % Reduct Depth: (cm) 0.2 Epitheli Area: (cm) 0.628 Tunneli Volume: (cm) 0.126 Undermi Wound Description Classification: Full Thickness Without Exposed Support Foul Od Structures Slough/ Wound Flat and Intact Margin: Exudate Medium Amount: Exudate Serosanguineous Type: Exudate red, brown Color: Wound Bed Granulation Amount: Medium (34-66%) Granulation Quality: Red Fascia E Necrotic Amount: Medium (34-66%) Fat Laye Necrotic Quality: Adherent Slough Tendon E Muscle E Joint Ex Bone Exp or After Cleansing: No Fibrino Yes Exposed Structure xposed: No r (Subcutaneous Tissue) Exposed: Yes xposed: No xposed: No posed: No osed: No ion in Area: -14.2% ion in Volume: -129.1% alization: Small (1-33%) ng: No ning: No Electronic  Signature(s) Signed: 02/08/2020 5:12:28 PM By: Mikeal Hawthorne EMT/HBOT Signed: 02/08/2020 5:17:01 PM By: Kela Millin Entered By: Mikeal Hawthorne on 02/08/2020 15:46:12 -------------------------------------------------------------------------------- Wound Assessment Details Patient Name: Date of Service: [name] 02/08/2020 9:15 AM Medical Record [id number] Patient Account Number: [id number] Date of Birth/Sex: Treating RN: 1933-11-21 (86 y.o. [name] Primary Care Amelie Caracci: Lajean Manes T Other Clinician: Referring Alhassan Everingham: Treating Dionysios Massman/Extender:Robson, Secundino Ginger, Hal T Weeks in Treatment: 7 Wound Status Wound Number: 4 Primary Pressure Ulcer Etiology: Wound Location: Left Foot - Medial Wound Open Wounding Event: Shear/Friction Status: Date Acquired: 01/19/2020 Comorbid Cataracts, Anemia, Arrhythmia, Congestive Weeks Of Treatment: 2 History: Heart Failure, Hypertension, Peripheral Clustered Wound: No Venous Disease, Received Radiation Photos Wound Measurements Length: (cm) 0.7 Width: (cm) 1.2 Depth: (cm) 0.2 Area: (cm) 0.66 Volume: (cm) 0.132 Wound Description Classification: Category/Stage II Wound Margin: Flat and  Intact Exudate Amount: Small Exudate Type: Serosanguineous Exudate Color: red, brown Wound Bed Granulation Amount: None Present (0%) Necrotic Amount: Large (67-100%) Necrotic Quality: Adherent Slough After Cleansing: No brino Yes Exposed Structure osed: No (Subcutaneous Tissue) Exposed: Yes osed: No osed: No sed: No ed: No % Reduction in Area: -947.6% % Reduction in Volume: -2100% Epithelialization: None Tunneling: No Undermining: No Foul Odor Slough/Fi Fascia Exp Fat Layer Tendon Exp Muscle Exp Joint Expo Bone Expos Electronic Signature(s) Signed: 02/08/2020 5:12:28 PM By: Mikeal Hawthorne EMT/HBOT Signed: 02/08/2020 5:17:01 PM By: Kela Millin Entered By: Mikeal Hawthorne on 02/08/2020  15:45:53 -------------------------------------------------------------------------------- Vitals Details Patient Name: Date of Service: [name] 02/08/2020 9:15 AM Medical Record [id number] Patient Account Number: [id number] Date of Birth/Sex: Treating RN: 09-Mar-1933 (86 y.o. [name] Primary Care Aidee Latimore: Lajean Manes T Other Clinician: Referring Teal Bontrager: Treating Erma Joubert/Extender:Robson, Secundino Ginger, Hal T Weeks in Treatment: 7 Vital Signs Time Taken: 09:47 Temperature (F): 98.0 Height (in): 69 Pulse (bpm): 101 Weight (lbs): 163 Respiratory Rate (breaths/min): 16 Body Mass Index (BMI): 24.1 Blood Pressure (mmHg): 146/89 Reference Range: 80 - 120 mg / dl Electronic Signature(s) Signed: 03/26/2020 9:23:26 AM By: Sandre Kitty Entered By: Sandre Kitty on 02/08/2020 09:47:33

## 2020-03-26 NOTE — Progress Notes (Signed)
[name], [name] ([id number]) Visit Report for 02/01/2020 Arrival Information Details Patient Name: Date of Service: [name], [name] 02/01/2020 9:00 AM Medical Record [id number] Patient Account Number: [id number] Date of Birth/Sex: Treating RN: May 10, 1933 (86 y.o. [name] Primary Care Judith Demps: Lajean Manes T Other Clinician: Referring Anjel Perfetti: Treating Jeremaine Maraj/Extender:Robson, Secundino Ginger, Hal T Weeks in Treatment: 6 Visit Information History Since Last Visit Added or deleted any medications: No Patient Arrived: Ambulatory Any new allergies or adverse reactions: No Arrival Time: 09:05 Had a fall or experienced change in No Accompanied By: self activities of daily living that may affect Transfer Assistance: None risk of falls: Patient Identification Verified: Yes Signs or symptoms of abuse/neglect since last No Secondary Verification Process Yes visito Completed: Hospitalized since last visit: No Patient Requires Transmission-Based No Implantable device outside of the clinic excluding No Precautions: cellular tissue based products placed in the center Patient Has Alerts: Yes since last visit: Patient Alerts: Patient on Blood Has Dressing in Place as Prescribed: Yes Thinner Pain Present Now: Yes Right ABI:0.99 Electronic Signature(s) Signed: 03/26/2020 9:23:26 AM By: Sandre Kitty Entered By: Sandre Kitty on 02/01/2020 09:06:50 -------------------------------------------------------------------------------- Clinic Level of Care Assessment Details Patient Name: Date of Service: [name], [name] 02/01/2020 9:00 AM Medical Record [id number] Patient Account Number: [id number] Date of Birth/Sex: Treating RN: 1933-03-16 (86 y.o. [name] Primary Care Kayston Jodoin: Lajean Manes T Other Clinician: Referring Danish Ruffins: Treating Gladyes Kudo/Extender:Robson, Secundino Ginger, Hal T Weeks in Treatment: 6 Clinic Level of Care  Assessment Items TOOL 4 Quantity Score X - Use when only an EandM is performed on FOLLOW-UP visit 1 0 ASSESSMENTS - Nursing Assessment / Reassessment X - Reassessment of Co-morbidities (includes updates in patient status) 1 10 X - Reassessment of Adherence to Treatment Plan 1 5 ASSESSMENTS - Wound and Skin Assessment / Reassessment _0  - Simple Wound Assessment / Reassessment - one wound 0 X - Complex Wound Assessment / Reassessment - multiple wounds 2 5 _1  - Dermatologic / Skin Assessment (not related to wound area) 0 ASSESSMENTS - Focused Assessment X - Circumferential Edema Measurements - multi extremities 2 5 _2  - Nutritional Assessment / Counseling / Intervention 0 _3  - Lower Extremity Assessment (monofilament, tuning fork, pulses) 0 _4  - Peripheral Arterial Disease Assessment (using hand held doppler) 0 ASSESSMENTS - Ostomy and/or Continence Assessment and Care _5  - Incontinence Assessment and Management 0 _6  - Ostomy Care Assessment and Management (repouching, etc.) 0 PROCESS - Coordination of Care X - Simple Patient / Family Education for ongoing care 1 15 _7  - Complex (extensive) Patient / Family Education for ongoing care 0 X - Staff obtains Programmer, systems, Records, Test Results / Process Orders 1 10 _8  - Staff telephones HHA, Nursing Homes / Clarify orders / etc 0 _9  - Routine Transfer to another Facility (non-emergent condition) 0 _10  - Routine Hospital Admission (non-emergent condition) 0 _11  - New Admissions / Biomedical engineer / Ordering NPWT, Apligraf, etc. 0 _12  - Emergency Hospital Admission (emergent condition) 0 X - Simple Discharge Coordination 1 10 _13  - Complex (extensive) Discharge Coordination 0 PROCESS - Special Needs _14  - Pediatric / Minor Patient Management 0 _15  - Isolation Patient Management 0 _16  - Hearing / Language / Visual special needs 0 _17  - Assessment of Community assistance (transportation, D/C planning, etc.) 0 _18  - Additional assistance / Altered  mentation 0 _19  - Support Surface(s) Assessment (bed, cushion, seat, etc.) 0 INTERVENTIONS - Wound Cleansing / Measurement _20  - Simple Wound Cleansing - one wound 0 X - Complex  Wound Cleansing - multiple wounds 2 5 X - Wound Imaging (photographs - any number of wounds) 1 5 _0  - Wound Tracing (instead of photographs) 0 _1  - Simple Wound Measurement - one wound 0 X - Complex Wound Measurement - multiple wounds 2 5 INTERVENTIONS - Wound Dressings X - Small Wound Dressing one or multiple wounds 1 10 _2  - Medium Wound Dressing one or multiple wounds 0 _3  - Large Wound Dressing one or multiple wounds 0 X - Application of Medications - topical 1 5 <[id number]>_4<\/[id number]>_9  - Application of Medications - injection 0 INTERVENTIONS - Miscellaneous _5  - External ear exam 0 _6  - Specimen Collection (cultures, biopsies, blood, body fluids, etc.) 0 _7  - Specimen(s) / Culture(s) sent or taken to Lab for analysis 0 _8  - Patient Transfer (multiple staff / Civil Service fast streamer / Similar devices) 0 _9  - Simple Staple / Suture removal (25 or less) 0 _10  - Complex Staple / Suture removal (26 or more) 0 _11  - Hypo / Hyperglycemic Management (close monitor of Blood Glucose) 0 _12  - Ankle / Brachial Index (ABI) - do not check if billed separately 0 X - Vital Signs 1 5 Has the patient been seen at the hospital within the last three years: Yes Total Score: 115 Level Of Care: New/Established - Level 3 Electronic Signature(s) Signed: 02/04/2020 1:40:10 PM By: Kela Millin Entered By: Kela Millin on 02/01/2020 09:37:36 -------------------------------------------------------------------------------- Encounter Discharge Information Details Patient Name: Date of Service: [name] 02/01/2020 9:00 AM Medical Record [id number] Patient Account Number: [id number] Date of Birth/Sex: Treating RN: 26-Sep-1933 (86 y.o. [name] Primary Care Alicea Wente: Patrina Levering Other Clinician: Referring Weslyn Holsonback: Treating  Chaim Gatley/Extender:Robson, Secundino Ginger, Junius Creamer in Treatment: 6 Encounter Discharge Information Items Discharge Condition: Stable Ambulatory Status: Ambulatory Discharge Destination: Home Transportation: Private Auto Accompanied By: self Schedule Follow-up Appointment: Yes Clinical Summary of Care: Electronic Signature(s) Signed: 02/01/2020 6:28:18 PM By: Deon Pilling Entered By: Deon Pilling on 02/01/2020 09:55:55 -------------------------------------------------------------------------------- Lower Extremity Assessment Details Patient Name: Date of Service: [name], [name] 02/01/2020 9:00 AM Medical Record [id number] Patient Account Number: [id number] Date of Birth/Sex: Treating RN: 1933/11/09 (86 y.o. [name] Primary Care Steffan Caniglia: Lajean Manes T Other Clinician: Referring Daniel Johndrow: Treating Madigan Rosensteel/Extender:Robson, Secundino Ginger, Hal T Weeks in Treatment: 6 Edema Assessment Assessed: [Left: No] [Right: No] Edema: [Left: Yes] [Right: No] Calf Left: Right: Point of Measurement: 42 cm From Medial Instep 32.5 cm 30 cm Ankle Left: Right: Point of Measurement: 13 cm From Medial Instep 20.4 cm 19.5 cm Vascular Assessment Pulses: Dorsalis Pedis Palpable: [Left:Yes] [Right:Yes] Electronic Signature(s) Signed: 02/04/2020 1:40:10 PM By: Kela Millin Signed: 03/26/2020 9:23:26 AM By: Sandre Kitty Entered By: Sandre Kitty on 02/01/2020 09:15:22 -------------------------------------------------------------------------------- Multi Wound Chart Details Patient Name: Date of Service: [name] 02/01/2020 9:00 AM Medical Record [id number] Patient Account Number: [id number] Date of Birth/Sex: Treating RN: [birth year] (86 y.o. [name] Primary Care Carney Saxton: Lajean Manes T Other Clinician: Referring Ayla Dunigan: Treating Adrieanna Boteler/Extender:Robson, Secundino Ginger, Hal T Weeks in Treatment: 6 Vital  Signs Height(in): 69 Pulse(bpm): 75 Weight(lbs): 163 Blood Pressure(mmHg): 152/76 Body Mass Index(BMI): 24 Temperature(F): 97.8 Respiratory 16 Rate(breaths/min): Photos: [3:No Photos] [4:No Photos] [N/A:N/A] Wound Location: [3:Right Lower Leg - Lateral Left Foot - Medial] [N/A:N/A] Wounding Event: [3:Gradually Appeared] [4:Shear/Friction] [N/A:N/A] Primary Etiology: [3:Venous Leg Ulcer] [4:Pressure Ulcer] [N/A:N/A] Comorbid History: [3:Cataracts, Anemia, Arrhythmia, Congestive Heart Failure, Hypertension, Heart Failure, Hypertension, Peripheral Venous Disease, Peripheral Venous Disease, Received Radiation] [4:Cataracts, Anemia, Arrhythmia, Congestive Received  Radiation] [N/A:N/A] Date  Acquired: [3:11/19/2019] [4:01/19/2020] [N/A:N/A] Weeks of Treatment: [3:6] [4:1] [N/A:N/A] Wound Status: [3:Open] [4:Open] [N/A:N/A] Measurements L x W x D 1x0.7x0.2 [4:0.7x0.9x0.1] [N/A:N/A] (cm) Area (cm) : [3:0.55] [4:0.495] [N/A:N/A] Volume (cm) : [3:0.11] [4:0.049] [N/A:N/A] % Reduction in Area: [3:0.00%] [4:-685.70%] [N/A:N/A] % Reduction in Volume: -100.00% [4:-716.70%] [N/A:N/A] Classification: [3:Full Thickness Without Exposed Support Structures] [4:Category/Stage II] [N/A:N/A] Exudate Amount: [3:Medium] [4:Medium] [N/A:N/A] Exudate Type: [3:Serosanguineous] [4:Serosanguineous] [N/A:N/A] Exudate Color: [3:red, brown] [4:red, brown] [N/A:N/A] Wound Margin: [3:Flat and Intact] [4:Flat and Intact] [N/A:N/A] Granulation Amount: [3:Medium (34-66%)] [4:Small (1-33%)] [N/A:N/A] Granulation Quality: [3:Red] [4:Pink] [N/A:N/A] Necrotic Amount: [3:Medium (34-66%)] [4:Large (67-100%)] [N/A:N/A] Exposed Structures: [3:Fat Layer (Subcutaneous Tissue) Exposed: Yes Fascia: No Tendon: No Muscle: No Joint: No Bone: No Small (1-33%)] [4:Fat Layer (Subcutaneous Tissue) Exposed: Yes Fascia: No Tendon: No Muscle: No Joint: No Bone: No None] [N/A:N/A N/A] Treatment Notes Wound #3 (Right, Lateral Lower Leg) 1.  Cleanse With Wound Cleanser Soap and water 2. Periwound Care Moisturizing lotion TCA Cream 3. Primary Dressing Applied Iodoflex 4. Secondary Dressing Dry Gauze 6. Support Layer Applied 3 layer compression wrap Wound #4 (Left, Medial Foot) 1. Cleanse With Wound Cleanser 3. Primary Dressing Applied Iodoflex 4. Secondary Dressing Dry Gauze Roll Gauze Foam 5. Secured With Medipore tape 7. Footwear/Offloading device applied Surgical shoe Electronic Signature(s) Signed: 02/01/2020 6:29:59 PM By: Linton Ham MD Signed: 02/04/2020 1:40:10 PM By: Kela Millin Entered By: Linton Ham on 02/01/2020 10:09:53 -------------------------------------------------------------------------------- Multi-Disciplinary Care Plan Details Patient Name: Date of Service: [name] 02/01/2020 9:00 AM Medical Record [id number] Patient Account Number: [id number] Date of Birth/Sex: Treating RN: 04-17-1933 (86 y.o. [name] Primary Care Waylyn Tenbrink: Patrina Levering Other Clinician: Referring Mackenze Grandison: Treating Adaliz Dobis/Extender:Robson, Secundino Ginger, Hal T Weeks in Treatment: 6 Active Inactive Abuse / Safety / Falls / Self Care Management Nursing Diagnoses: Potential for falls Goals: Patient/caregiver will verbalize understanding of skin care regimen Date Initiated: 12/20/2019 Target Resolution Date: 02/22/2020 Goal Status: Active Patient/caregiver will verbalize/demonstrate understanding of what to do in case of emergency Date Initiated: 12/20/2019 Target Resolution Date: 02/22/2020 Goal Status: Active Interventions: Assess fall risk on admission and as needed Provide education on fall prevention Notes: Pain, Acute or Chronic Nursing Diagnoses: Pain, acute or chronic: actual or potential Potential alteration in comfort, pain Goals: Patient will verbalize adequate pain control and receive pain control interventions during procedures as needed Date  Initiated: 12/20/2019 Target Resolution Date: 02/22/2020 Goal Status: Active Patient/caregiver will verbalize comfort level met Date Initiated: 12/20/2019 Target Resolution Date: 02/22/2020 Goal Status: Active Interventions: Complete pain assessment as per visit requirements Provide education on pain management Notes: Wound/Skin Impairment Nursing Diagnoses: Knowledge deficit related to ulceration/compromised skin integrity Goals: Patient/caregiver will verbalize understanding of skin care regimen Date Initiated: 12/20/2019 Target Resolution Date: 02/22/2020 Goal Status: Active Interventions: Assess patient/caregiver ability to perform ulcer/skin care regimen upon admission and as needed Provide education on ulcer and skin care Treatment Activities: Skin care regimen initiated : 12/20/2019 Topical wound management initiated : 12/20/2019 Notes: Electronic Signature(s) Signed: 02/04/2020 1:40:10 PM By: Kela Millin Entered By: Kela Millin on 02/01/2020 09:15:51 -------------------------------------------------------------------------------- Pain Assessment Details Patient Name: Date of Service: [name] 02/01/2020 9:00 AM Medical Record [id number] Patient Account Number: [id number] Date of Birth/Sex: Treating RN: 1933/06/08 (86 y.o. [name] Primary Care Balen Woolum: Patrina Levering Other Clinician: Referring Mychelle Kendra: Treating [place]/Extender:Robson, Secundino Ginger, Hal T Weeks in Treatment: 6 Active Problems Location of Pain Severity and Description of Pain Patient Has Paino Yes Site Locations Rate the pain.  Current Pain Level: 2 Pain Management and Medication Current Pain Management: Electronic Signature(s) Signed: 02/04/2020 1:40:10 PM By: Kela Millin Signed: 03/26/2020 9:23:26 AM By: Sandre Kitty Entered By: Sandre Kitty on 02/01/2020  09:07:27 -------------------------------------------------------------------------------- Patient/Caregiver Education Details Patient Name: Date of Service: [name], Tracy J. 2/19/2021andnbsp9:00 AM Medical Record [phone] Patient Account Number: [id number] Date of Birth/Gender: [birth year] (86 y.o. F) Treating RN: Kela Millin Primary Care Physician: Patrina Levering Other Clinician: Referring Physician: Treating Physician/Extender:Robson, Secundino Ginger, Junius Creamer in Treatment: 6 Education Assessment Education Provided To: Patient Education Topics Provided Pain: Methods: Explain/Verbal Responses: State content correctly Safety: Methods: Explain/Verbal Responses: State content correctly Wound/Skin Impairment: Methods: Explain/Verbal Responses: State content correctly Electronic Signature(s) Signed: 02/04/2020 1:40:10 PM By: Kela Millin Entered By: Kela Millin on 02/01/2020 09:16:08 -------------------------------------------------------------------------------- Wound Assessment Details Patient Name: Date of Service: [name] 02/01/2020 9:00 AM Medical Record [id number] Patient Account Number: [id number] Date of Birth/Sex: Treating RN: [birth year] (86 y.o. [name] Primary Care Savir Blanke: Lajean Manes T Other Clinician: Referring Javi Bollman: Treating Celena [name]/Extender:Robson, Secundino Ginger, Hal T Weeks in Treatment: 6 Wound Status Wound Number: 3 Primary Venous Leg Ulcer Etiology: Wound Location: Right Lower Leg - Lateral Wound Open Wounding Event: Gradually Appeared Status: Date Acquired: 11/19/2019 ComorbidCataracts, Anemia, Arrhythmia, Congestive Weeks Of Treatment: 6 Weeks Of Treatment: 6 History: Heart Failure, Hypertension, Peripheral Clustered Wound: No Venous Disease, Received Radiation Photos Wound Measurements Length: (cm) 1 % Reduct Width: (cm) 0.7 % Reduct Depth: (cm) 0.2 Epitheli Area: (cm)  0.55 Tunneli Volume: (cm) 0.11 Undermi Wound Description Full Thickness Without Exposed Support Foul Od Classification: Structures Slough/ Wound Flat and Intact Margin: Exudate Medium Amount: Exudate Serosanguineous Type: Exudate red, brown Color: Wound Bed Granulation Amount: Medium (34-66%) Granulation Quality: Red Fascia E Necrotic Amount: Medium (34-66%) Fat Laye Necrotic Quality: Adherent Slough Tendon E Muscle E Joint Ex Bone Exp or After Cleansing: No Fibrino Yes Exposed Structure xposed: No r (Subcutaneous Tissue) Exposed: Yes xposed: No xposed: No posed: No osed: No ion in Area: 0% ion in Volume: -100% alization: Small (1-33%) ng: No ning: No Electronic Signature(s) Signed: 02/01/2020 5:06:31 PM By: Mikeal Hawthorne EMT/HBOT Signed: 02/04/2020 1:40:10 PM By: Kela Millin Entered By: Mikeal Hawthorne on 02/01/2020 14:45:41 -------------------------------------------------------------------------------- Wound Assessment Details Patient Name: Date of Service: [name] 02/01/2020 9:00 AM Medical Record [id number] Patient Account Number: [id number] Date of Birth/Sex: Treating RN: May 21, 1933 (86 y.o. [name] Primary Care Banjamin Stovall: Lajean Manes T Other Clinician: Referring Salvadore Valvano: Treating Mccade Sullenberger/Extender:Robson, Secundino Ginger, Hal T Weeks in Treatment: 6 Wound Status Wound Number: 4 Primary Pressure Ulcer Etiology: Wound Location: Left Foot - Medial Wound Open Wounding Event: Shear/Friction Status: Date Acquired: 01/19/2020 Comorbid Cataracts, Anemia, Arrhythmia, Congestive Weeks Of Treatment: 1 History: Heart Failure, Hypertension, Peripheral Clustered Wound: No Venous Disease, Received Radiation Photos Wound Measurements Length: (cm) 0.7 Width: (cm) 0.9 Depth: (cm) 0.1 Area: (cm) 0.495 Volume: (cm) 0.049 Wound Description Classification: Category/Stage II Wound Margin: Flat and Intact Exudate Amount:  Medium Exudate Type: Serosanguineous Exudate Color: red, brown Wound Bed Granulation Amount: Small (1-33%) Granulation Quality: Pink Necrotic Amount: Large (67-100%) Necrotic Quality: Adherent Slough After Cleansing: No brino Yes Exposed Structure osed: No (Subcutaneous Tissue) Exposed: Yes osed: No osed: No sed: No ed: No % Reduction in Area: -685.7% % Reduction in Volume: -716.7% Epithelialization: None Tunneling: No Undermining: No Foul Odor Slough/Fi Fascia Exp Fat Layer Tendon Exp Muscle Exp Joint Expo Bone Expos Electronic Signature(s) Signed: 02/01/2020 5:06:31 PM By: Mikeal Hawthorne EMT/HBOT Signed: 02/04/2020  1:40:10 PM By: Kela Millin Entered ByMikeal Hawthorne on 02/01/2020 14:48:24 -------------------------------------------------------------------------------- Vitals Details Patient Name: Date of Service: [name] 02/01/2020 9:00 AM Medical Record [id number] Patient Account Number: [id number] Date of Birth/Sex: Treating RN: 06/20/1933 (86 y.o. [name] Primary Care Klay Sobotka: Lajean Manes T Other Clinician: Referring Aryav Wimberly: Treating Josiyah Tozzi/Extender:Robson, Secundino Ginger, Hal T Weeks in Treatment: 6 Vital Signs Time Taken: 09:06 Temperature (F): 97.8 Height (in): 69 Pulse (bpm): 75 Weight (lbs): 163 Respiratory Rate (breaths/min): 16 Body Mass Index (BMI): 24.1 Blood Pressure (mmHg): 152/76 Reference Range: 80 - 120 mg / dl Electronic Signature(s) Signed: 03/26/2020 9:23:26 AM By: Sandre Kitty Entered By: Sandre Kitty on 02/01/2020 09:07:18

## 2020-03-28 ENCOUNTER — Ambulatory Visit (HOSPITAL_COMMUNITY)
Admission: RE | Admit: 2020-03-28 | Discharge: 2020-03-28 | Disposition: A | Discharge status: Home / Self Care | Payer: Medicare Other | Source: Ambulatory Visit | Attending: Vascular Surgery | Admitting: Vascular Surgery

## 2020-03-28 ENCOUNTER — Other Ambulatory Visit: Payer: Self-pay

## 2020-03-28 ENCOUNTER — Other Ambulatory Visit (HOSPITAL_COMMUNITY): Payer: Self-pay | Admitting: Internal Medicine

## 2020-03-28 ENCOUNTER — Encounter (HOSPITAL_BASED_OUTPATIENT_CLINIC_OR_DEPARTMENT_OTHER): Payer: Medicare Other | Admitting: Internal Medicine

## 2020-03-28 DIAGNOSIS — L97823 Non-pressure chronic ulcer of other part of left lower leg with necrosis of muscle: Secondary | ICD-10-CM

## 2020-03-28 DIAGNOSIS — L97522 Non-pressure chronic ulcer of other part of left foot with fat layer exposed: Secondary | ICD-10-CM | POA: Diagnosis not present

## 2020-03-29 NOTE — Progress Notes (Signed)
[name] ([id number]) Visit Report for 03/28/2020 Debridement Details Patient Name: Date of Service: [name] 03/28/2020 8:45 AM Medical Record [id number] Patient Account Number: [id number] Date of Birth/Sex: Treating RN: 11-Jul-1933 (86 y.o. [name] Primary Care Provider: Lajean Manes T Other Clinician: Referring Provider: Treating Provider/Extender:Kenroy Timberman, Secundino Ginger, Hal T Weeks in Treatment: 14 Debridement Performed for Wound #5 Left,Lateral Lower Leg Assessment: Performed By: Physician Ricard Dillon., MD Debridement Type: Debridement Severity of Tissue Pre Fat layer exposed Debridement: Level of Consciousness (Pre- Awake and Alert procedure): Pre-procedure Verification/Time Out Taken: Yes - 09:15 Start Time: 09:15 Pain Control: Other : benzocaine, 20% Total Area Debrided (L x W): 1.2 (cm) x 1 (cm) = 1.2 (cm) Tissue and other material Viable, Non-Viable, Subcutaneous, Fibrin/Exudate debrided: Level: Skin/Subcutaneous Tissue Debridement Description: Excisional Instrument: Curette Bleeding: Minimum Hemostasis Achieved: Pressure End Time: 09:16 Procedural Pain: 2 Post Procedural Pain: 0 Response to Treatment: Procedure was tolerated well Level of Consciousness Awake and Alert (Post-procedure): Post Debridement Measurements of Total Wound Length: (cm) 1.2 Width: (cm) 1 Depth: (cm) 0.1 Volume: (cm) 0.094 Character of Wound/Ulcer Post Requires Further Debridement Debridement: Severity of Tissue Post Debridement: Fat layer exposed Post Procedure Diagnosis Same as Pre-procedure Electronic Signature(s) Signed: 03/28/2020 4:21:34 PM By: Kela Millin Signed: 03/29/2020 6:55:41 AM By: Linton Ham MD Entered By: Linton Ham on 03/28/2020 09:24:10 -------------------------------------------------------------------------------- HPI Details Patient Name: Date of Service: [name] 03/28/2020 8:45  AM Medical Record [id number] Patient Account Number: [id number] Date of Birth/Sex: Treating RN: 1933-02-22 (86 y.o. [name] Primary Care Provider: Patrina Levering Other Clinician: Referring Provider: Treating Provider/Extender:Berthe Oley, Secundino Ginger, Hal T Weeks in Treatment: 14 History of Present Illness HPI Description: ADMISSION 12/20/2019 This is an 86 year old woman referred by her primary physician Dr. Felipa Eth for review of a wound on her right lateral lower leg. She was actually in this clinic on 2 separate occasions in 2010 and 2012 cared for by Dr. Sherilyn Cooter. At that point in time she had wounds on her right leg as well. She tells [place] to 1 month ago she noticed a scab building up on her right lateral lower leg this opened into a wound. There was no overt cause of this no trauma, no infection that she is aware of. She has a history of chronic venous insufficiency and wears compression stockings fairly religiously indeed she is done well over the last 8 years since she was last in this clinic. She has been applying Vaseline on this and a covering phone. This is not progressing towards healing. Past medical history; interstitial lung disease, chronic atrial fibrillation status post pacemaker, osteoarthritis of the left knee, left breast CA, chronic repeat venous insufficiency. She takes Eliquis for her atrial fibrillation stroke prophylaxis. ABI in our clinic was 0.99 on the right 1/15; superficial wound on the right lateral calf in the setting of severe acute skin changes from chronic venous insufficiency and lymphedema. We used Iodoflex last week she complained of a lot of pain 1/22; this is a small but difficult wound on the right lateral calf in the setting of severe chronic venous insufficiency and secondary lymphedema. She continues to state the wounds things hurts when she is up on it but seems to be relieved by putting her leg up. I changed her to  [facility] last week because the Iodoflex seem to be causing stinging. 1/29. This wound appears to be contracting somewhat. Changes of chronic venous insufficiency with secondary lymphedema 2/12; not as good  as surface today and slightly bigger. I had to change her from Iodoflex to [facility] because of the stinging pain although she does not think it was any better on the [facility]. She comes into clinic today with a area on the medial left great toe. She said she noticed blood on her sock last Saturday. She had some form of bunion surgery by Dr. Ila Mcgill her podiatrist sometime in 2019 she said he "shave the area". I could not find his operative report although I did see reference to the bunion in the area. 2/19; somewhat improved wound on the right lateral lower leg however the wound she came in on the bunion of her left great toe is actually I think larger still somewhat inflamed. We have been using Iodoflex 2/26; not much improvement in either wound area which is the original venous wound on the right lateral lower leg and in the area on the bunion of her left great toe. This still looks somewhat inflamed and tender. We have been using Iodoflex with open much improvement. 3/4; in general both her wounds look better this includes the original venous insufficiency wound on the right lateral lower leg and the area on her tip of her bunion on the left great toe medial aspect of the MTP. Change to Hydrofera Blue last week 3/12; the patient still has a small geographic shaped wound on the right lateral lower leg. We have been using Hydrofera Blue but I changed her to endoform today. She also has the area in the tip of the bunion of the left great toe. We will try Hydrofera Blue here as well 3/19; the small geographic wound on the right lateral lower leg has not filled in. There is some surrounding induration we have noticed this previously. This may be venous inflammation but I  wonder about biopsying this if this is not closing up. The area over the left medial first MTP [bunion deformity] requires debridement. This is not closing in. I am not convinced she is offloading adequately 3/26; right lateral lower leg in the setting of severe venous insufficiency and also an area over the first MTP bunion deformity. No debridement is required. We have been using endoform 4/9; right lateral lower leg wound in the setting of severe venous insufficiency and also a refractory area over the first MTP bunion deformity. The area on the right lateral lower leg is just about closed there is still a minor open area here. She comes in today with a mirror image area on the left lateral lower leg. She says this started as a scab 2 weeks ago and is gradually morphed into an open wound very similar to what she has on the right leg. She has severe bilateral venous insufficiency with venous hypertension obvious from clinical exam. 4/16; her original wound the right lateral leg wound in the setting of chronic venous hypertension is a small open area but very small. We have been using endoform. Her wound over the left first MTP bunion deformity also appears to be small and closing in. Unfortunately she has a large relative area on the left lateral calf which was new last week. Very adherent debris over this we have been using Iodoflex however that may be contributing to the debris on the wound surface Electronic Signature(s) Signed: 03/29/2020 6:55:41 AM By: Linton Ham MD Entered By: Linton Ham on 03/28/2020 09:25:09 -------------------------------------------------------------------------------- Physical Exam Details Patient Name: Date of Service: [name]. 03/28/2020 8:45 AM Medical Record  [id number] Patient Account Number: [id number] Date of Birth/Sex: Treating RN: 24-Jan-1933 (86 y.o. [name] Primary Care Provider: Patrina Levering Other  Clinician: Referring Provider: Treating Provider/Extender:Fritz Cauthon, Secundino Ginger, Hal T Weeks in Treatment: 14 Constitutional Patient is hypertensive.. Pulse regular and within target range for patient.Marland Kitchen Respirations regular, non-labored and within target range.. Temperature is normal and within the target range for the patient.Marland Kitchen Appears in no distress. Cardiovascular She does not have a lot of edema but she has evidence of severe venous hypertension with multiple dilated small veins in her ankles and feet. Integumentary (Hair, Skin) No evidence of surrounding infection. Notes Wound exam; the area we have been following on the right lateral leg is a small open wound unfortunately not much better than last week. The area on the left bunion slitlike deformity not a lot of depth this may be closing down as well The left lateral calf a more substantial wound. Very adherent necrotic material partly Iodoflex. Very difficult debridement with a #5 curette. This is a very difficult area to get to a clean surface. Electronic Signature(s) Signed: 03/29/2020 6:55:41 AM By: Linton Ham MD Entered By: Linton Ham on 03/28/2020 09:27:20 -------------------------------------------------------------------------------- Physician Orders Details Patient Name: Date of Service: [name] 03/28/2020 8:45 AM Medical Record [id number] Patient Account Number: [id number] Date of Birth/Sex: Treating RN: [birth year] (86 y.o. [name] Primary Care Provider: Lajean Manes T Other Clinician: Referring Provider: Treating Provider/Extender:Edwin Cherian, Secundino Ginger, Junius Creamer in Treatment: 2 Verbal / Phone Orders: No Diagnosis Coding ICD-10 Coding Code Description I87.321 Chronic venous hypertension (idiopathic) with inflammation of right lower extremity L97.812 Non-pressure chronic ulcer of other part of right lower leg with fat layer exposed L97.828 Non-pressure chronic  ulcer of other part of left lower leg with other specified severity I87.322 Chronic venous hypertension (idiopathic) with inflammation of left lower extremity L97.522 Non-pressure chronic ulcer of other part of left foot with fat layer exposed Follow-up Appointments Return Appointment in 1 week. - Friday Dressing Change Frequency Wound #3 Right,Lateral Lower Leg Do not change entire dressing for one week. Wound #4 Left,Medial Foot Change Dressing every other day. - only to left medial foot wound Wound #5 Left,Lateral Lower Leg Do not change entire dressing for one week. Skin Barriers/Peri-Wound Care Moisturizing lotion TCA Cream or Ointment - mixed with lotion to leg, TCA over wound bed as well Wound Cleansing May shower with protection. - cast protector Primary Wound Dressing Wound #3 Right,Lateral Lower Leg Endoform - moisten with saline Wound #4 Left,Medial Foot Endoform - moisten with saline Wound #5 Left,Lateral Lower Leg Cutimed Sorbact - moisten with hydrogel Secondary Dressing Wound #3 Right,Lateral Lower Leg Dry Gauze Wound #4 Left,Medial Foot Foam - or tape Dry Gauze Wound #5 Left,Lateral Lower Leg Dry Gauze Edema Control 3 Layer Compression System - Bilateral Avoid standing for long periods of time Elevate legs to the level of the heart or above for 30 minutes daily and/or when sitting, a frequency of: - throughout the day. Exercise regularly Support Garment 30-40 mm/Hg pressure to: - farrow wrap 4000 for to left leg Off-Loading Open toe surgical shoe to: - left Electronic Signature(s) Signed: 03/28/2020 4:21:34 PM By: Kela Millin Signed: 03/29/2020 6:55:41 AM By: Linton Ham MD Entered By: Kela Millin on 03/28/2020 09:18:07 -------------------------------------------------------------------------------- Problem List Details Patient Name: Date of Service: [name] 03/28/2020 8:45 AM Medical Record ON:[id number] Patient Account  Number: [id number] Date of Birth/Sex: Treating RN: [birth year] (86 y.o. [name] Primary Care Provider:  Stoneking, Hal T Other Clinician: Referring Provider: Treating Provider/Extender:Matilde Markie, Secundino Ginger, Hal T Weeks in Treatment: 14 Active Problems ICD-10 Evaluated Encounter Code Description Active Date Today Diagnosis I87.321 Chronic venous hypertension (idiopathic) with 12/20/2019 No Yes inflammation of right lower extremity L97.812 Non-pressure chronic ulcer of other part of right lower 12/20/2019 No Yes leg with fat layer exposed L97.828 Non-pressure chronic ulcer of other part of left lower 03/21/2020 No Yes leg with other specified severity I87.322 Chronic venous hypertension (idiopathic) with 02/14/2020 No Yes inflammation of left lower extremity L97.522 Non-pressure chronic ulcer of other part of left foot 01/25/2020 No Yes with fat layer exposed Inactive Problems Resolved Problems Electronic Signature(s) Signed: 03/29/2020 6:55:41 AM By: Linton Ham MD Entered By: Linton Ham on 03/28/2020 09:23:42 -------------------------------------------------------------------------------- Progress Note Details Patient Name: Date of Service: [name] 03/28/2020 8:45 AM Medical Record [id number] Patient Account Number: [id number] Date of Birth/Sex: Treating RN: 12/31/1932 (86 y.o. [name] Primary Care Provider: Patrina Levering Other Clinician: Referring Provider: Treating Provider/Extender:Kolt Mcwhirter, Secundino Ginger, Hal T Weeks in Treatment: 14 Subjective History of Present Illness (HPI) ADMISSION 12/20/2019 This is an 86 year old woman referred by her primary physician Dr. Felipa Eth for review of a wound on her right lateral lower leg. She was actually in this clinic on 2 separate occasions in 2010 and 2012 cared for by Dr. Sherilyn Cooter. At that point in time she had wounds on her right leg as well. She tells [place] to 1 month ago she noticed  a scab building up on her right lateral lower leg this opened into a wound. There was no overt cause of this no trauma, no infection that she is aware of. She has a history of chronic venous insufficiency and wears compression stockings fairly religiously indeed she is done well over the last 8 years since she was last in this clinic. She has been applying Vaseline on this and a covering phone. This is not progressing towards healing. Past medical history; interstitial lung disease, chronic atrial fibrillation status post pacemaker, osteoarthritis of the left knee, left breast CA, chronic repeat venous insufficiency. She takes Eliquis for her atrial fibrillation stroke prophylaxis. ABI in our clinic was 0.99 on the right 1/15; superficial wound on the right lateral calf in the setting of severe acute skin changes from chronic venous insufficiency and lymphedema. We used Iodoflex last week she complained of a lot of pain 1/22; this is a small but difficult wound on the right lateral calf in the setting of severe chronic venous insufficiency and secondary lymphedema. She continues to state the wounds things hurts when she is up on it but seems to be relieved by putting her leg up. I changed her to [facility] last week because the Iodoflex seem to be causing stinging. 1/29. This wound appears to be contracting somewhat. Changes of chronic venous insufficiency with secondary lymphedema 2/12; not as good as surface today and slightly bigger. I had to change her from Iodoflex to [facility] because of the stinging pain although she does not think it was any better on the [facility]. ooShe comes into clinic today with a area on the medial left great toe. She said she noticed blood on her sock last Saturday. She had some form of bunion surgery by Dr. Ila Mcgill her podiatrist sometime in 2019 she said he "shave the area". I could not find his operative report although I did see reference  to the bunion in the area. 2/19; somewhat improved wound on  the right lateral lower leg however the wound she came in on the bunion of her left great toe is actually I think larger still somewhat inflamed. We have been using Iodoflex 2/26; not much improvement in either wound area which is the original venous wound on the right lateral lower leg and in the area on the bunion of her left great toe. This still looks somewhat inflamed and tender. We have been using Iodoflex with open much improvement. 3/4; in general both her wounds look better this includes the original venous insufficiency wound on the right lateral lower leg and the area on her tip of her bunion on the left great toe medial aspect of the MTP. Change to Hydrofera Blue last week 3/12; the patient still has a small geographic shaped wound on the right lateral lower leg. We have been using Hydrofera Blue but I changed her to endoform today. She also has the area in the tip of the bunion of the left great toe. We will try Hydrofera Blue here as well 3/19; the small geographic wound on the right lateral lower leg has not filled in. There is some surrounding induration we have noticed this previously. This may be venous inflammation but I wonder about biopsying this if this is not closing up. The area over the left medial first MTP [bunion deformity] requires debridement. This is not closing in. I am not convinced she is offloading adequately 3/26; right lateral lower leg in the setting of severe venous insufficiency and also an area over the first MTP bunion deformity. No debridement is required. We have been using endoform 4/9; right lateral lower leg wound in the setting of severe venous insufficiency and also a refractory area over the first MTP bunion deformity. The area on the right lateral lower leg is just about closed there is still a minor open area here. She comes in today with a mirror image area on the left lateral lower  leg. She says this started as a scab 2 weeks ago and is gradually morphed into an open wound very similar to what she has on the right leg. She has severe bilateral venous insufficiency with venous hypertension obvious from clinical exam. 4/16; her original wound the right lateral leg wound in the setting of chronic venous hypertension is a small open area but very small. We have been using endoform. Her wound over the left first MTP bunion deformity also appears to be small and closing in. Unfortunately she has a large relative area on the left lateral calf which was new last week. Very adherent debris over this we have been using Iodoflex however that may be contributing to the debris on the wound surface Objective Constitutional Patient is hypertensive.. Pulse regular and within target range for patient.Marland Kitchen Respirations regular, non-labored and within target range.. Temperature is normal and within the target range for the patient.Marland Kitchen Appears in no distress. Vitals Time Taken: 8:53 AM, Height: 69 in, Weight: 163 lbs, BMI: 24.1, Temperature: 97.8 F, Pulse: 59 bpm, Respiratory Rate: 18 breaths/min, Blood Pressure: 162/78 mmHg. Cardiovascular She does not have a lot of edema but she has evidence of severe venous hypertension with multiple dilated small veins in her ankles and feet. General Notes: Wound exam; the area we have been following on the right lateral leg is a small open wound unfortunately not much better than last week. ooThe area on the left bunion slitlike deformity not a lot of depth this may be closing down as well ooThe  left lateral calf a more substantial wound. Very adherent necrotic material partly Iodoflex. Very difficult debridement with a #5 curette. This is a very difficult area to get to a clean surface. Integumentary (Hair, Skin) No evidence of surrounding infection. Wound #3 status is Open. Original cause of wound was Gradually Appeared. The wound is located on  the Right,Lateral Lower Leg. The wound measures 0.3cm length x 0.2cm width x 0.1cm depth; 0.047cm^2 area and 0.005cm^3 volume. There is no tunneling or undermining noted. There is a small amount of serosanguineous drainage noted. The wound margin is flat and intact. There is large (67-100%) red granulation within the wound bed. There is no necrotic tissue within the wound bed. Wound #4 status is Open. Original cause of wound was Shear/Friction. The wound is located on the Left,Medial Foot. The wound measures 0.1cm length x 0.1cm width x 0.1cm depth; 0.008cm^2 area and 0.001cm^3 volume. There is Fat Layer (Subcutaneous Tissue) Exposed exposed. There is no tunneling or undermining noted. There is a small amount of serous drainage noted. The wound margin is flat and intact. There is large (67-100%) pale granulation within the wound bed. There is no necrotic tissue within the wound bed. Wound #5 status is Open. Original cause of wound was Gradually Appeared. The wound is located on the Left,Lateral Lower Leg. The wound measures 1.2cm length x 1cm width x 0.1cm depth; 0.942cm^2 area and 0.094cm^3 volume. There is no tunneling or undermining noted. There is a small amount of serosanguineous drainage noted. The wound margin is flat and intact. There is no granulation within the wound bed. There is a large (67-100%) amount of necrotic tissue within the wound bed including Eschar and Adherent Slough. Assessment Active Problems ICD-10 Chronic venous hypertension (idiopathic) with inflammation of right lower extremity Non-pressure chronic ulcer of other part of right lower leg with fat layer exposed Non-pressure chronic ulcer of other part of left lower leg with other specified severity Chronic venous hypertension (idiopathic) with inflammation of left lower extremity Non-pressure chronic ulcer of other part of left foot with fat layer exposed Procedures Wound #5 Pre-procedure diagnosis of Wound #5 is  a Venous Leg Ulcer located on the Left,Lateral Lower Leg .Severity of Tissue Pre Debridement is: Fat layer exposed. There was a Excisional Skin/Subcutaneous Tissue Debridement with a total area of 1.2 sq cm performed by Ricard Dillon., MD. With the following instrument(s): Curette to remove Viable and Non-Viable tissue/material. Material removed includes Subcutaneous Tissue and Fibrin/Exudate and after achieving pain control using Other (benzocaine, 20%). No specimens were taken. A time out was conducted at 09:15, prior to the start of the procedure. A Minimum amount of bleeding was controlled with Pressure. The procedure was tolerated well with a pain level of 2 throughout and a pain level of 0 following the procedure. Post Debridement Measurements: 1.2cm length x 1cm width x 0.1cm depth; 0.094cm^3 volume. Character of Wound/Ulcer Post Debridement requires further debridement. Severity of Tissue Post Debridement is: Fat layer exposed. Post procedure Diagnosis Wound #5: Same as Pre-Procedure Pre-procedure diagnosis of Wound #5 is a Venous Leg Ulcer located on the Left,Lateral Lower Leg . There was a Three Layer Compression Therapy Procedure by Deon Pilling, RN. Post procedure Diagnosis Wound #5: Same as Pre-Procedure Wound #3 Pre-procedure diagnosis of Wound #3 is a Venous Leg Ulcer located on the Right,Lateral Lower Leg . There was a Three Layer Compression Therapy Procedure by Deon Pilling, RN. Post procedure Diagnosis Wound #3: Same as Pre-Procedure Plan Follow-up Appointments: Return Appointment  in 1 week. - Friday Dressing Change Frequency: Wound #3 Right,Lateral Lower Leg: Do not change entire dressing for one week. Wound #4 Left,Medial Foot: Change Dressing every other day. - only to left medial foot wound Wound #5 Left,Lateral Lower Leg: Do not change entire dressing for one week. Skin Barriers/Peri-Wound Care: Moisturizing lotion TCA Cream or Ointment - mixed with lotion  to leg, TCA over wound bed as well Wound Cleansing: May shower with protection. - cast protector Primary Wound Dressing: Wound #3 Right,Lateral Lower Leg: Endoform - moisten with saline Wound #4 Left,Medial Foot: Endoform - moisten with saline Wound #5 Left,Lateral Lower Leg: Cutimed Sorbact - moisten with hydrogel Secondary Dressing: Wound #3 Right,Lateral Lower Leg: Dry Gauze Wound #4 Left,Medial Foot: Foam - or tape Dry Gauze Wound #5 Left,Lateral Lower Leg: Dry Gauze Edema Control: 3 Layer Compression System - Bilateral Avoid standing for long periods of time Elevate legs to the level of the heart or above for 30 minutes daily and/or when sitting, a frequency of: - throughout the day. Exercise regularly Support Garment 30-40 mm/Hg pressure to: - farrow wrap 4000 for to left leg Off-Loading: Open toe surgical shoe to: - left 1. With endoform to the 2 original wounds on the left first MTP and the right lateral calf 2. Change the dressing on the left 2 Sorbact hydrogel Electronic Signature(s) Signed: 03/29/2020 6:55:41 AM By: Linton Ham MD Entered By: Linton Ham on 03/28/2020 09:28:02 -------------------------------------------------------------------------------- SuperBill Details Patient Name: Date of Service: [name] 03/28/2020 Medical Record [id number] Patient Account Number: [id number] Date of Birth/Sex: Treating RN: 1933-02-08 (86 y.o. [name] Primary Care Provider: Lajean Manes T Other Clinician: Referring Provider: Treating Provider/Extender:Jahki Witham, Secundino Ginger, Hal T Weeks in Treatment: 14 Diagnosis Coding ICD-10 Codes Code Description I87.321 Chronic venous hypertension (idiopathic) with inflammation of right lower extremity L97.812 Non-pressure chronic ulcer of other part of right lower leg with fat layer exposed L97.828 Non-pressure chronic ulcer of other part of left lower leg with other specified  severity I87.322 Chronic venous hypertension (idiopathic) with inflammation of left lower extremity L97.522 Non-pressure chronic ulcer of other part of left foot with fat layer exposed Facility Procedures The patient participates with Medicare or their insurance follows the Medicare Facility Guidelines: CPT4 Code Description Modifier Quantity [id number] 11042 - DEB SUBQ TISSUE 20 SQ CM/< 1 ICD-10 Diagnosis Description L97.828 Non-pressure chronic ulcer of  other part of left lower leg with other specified severity Physician Procedures CPT4 Code Description: [id number] 11042 - WC PHYS SUBQ TISS 20 SQ CM ICD-10 Diagnosis Description L97.828 Non-pressure chronic ulcer of other part of left lower leg wit severity Modifier: h other specified Quantity: 1 Electronic Signature(s) Signed: 03/29/2020 6:55:41 AM By: Linton Ham MD Entered By: Linton Ham on 03/28/2020 09:28:17

## 2020-04-01 NOTE — Progress Notes (Signed)
[name], [name] ([id number]) Visit Report for 03/28/2020 Arrival Information Details Patient Name: Date of Service: [name] 03/28/2020 8:45 AM Medical Record [id number] Patient Account Number: [id number] Date of Birth/Sex: Treating RN: 1933/08/20 (86 y.o. [name] Primary Care Alayzia Pavlock: Lajean Manes T Other Clinician: Referring Kashana Breach: Treating Ghalia Reicks/Extender:Robson, Secundino Ginger, Hal T Weeks in Treatment: 14 Visit Information History Since Last Visit Added or deleted any medications: No Patient Arrived: Ambulatory Any new allergies or adverse reactions: No Arrival Time: 08:53 Had a fall or experienced change in No Accompanied By: alone activities of daily living that may affect Transfer Assistance: None risk of falls: Patient Identification Verified: Yes Signs or symptoms of abuse/neglect since last No Secondary Verification Process Yes visito Completed: Hospitalized since last visit: No Patient Requires Transmission-Based No Implantable device outside of the clinic excluding No Precautions: cellular tissue based products placed in the center Patient Has Alerts: Yes since last visit: Patient Alerts: Patient on Blood Has Dressing in Place as Prescribed: Yes Thinner Has Compression in Place as Prescribed: Yes Right ABI:0.99 Pain Present Now: Yes Electronic Signature(s) Signed: 03/28/2020 4:22:27 PM By: [name] RN, BSN Entered By: [name] on 03/28/2020 08:53:34 -------------------------------------------------------------------------------- Compression Therapy Details Patient Name: Date of Service: [name] 03/28/2020 8:45 AM Medical Record [id number] Patient Account Number: [id number] Date of Birth/Sex: Treating RN: 12-Feb-1933 (86 y.o. [name] Primary Care Tage Feggins: Patrina Levering Other Clinician: Referring Jaxsyn Catalfamo: Treating Kimberely Mccannon/Extender:Robson, Secundino Ginger, Hal T Weeks in  Treatment: 14 Compression Therapy Performed for Wound Wound #3 Right,Lateral Lower Leg Assessment: Performed By: Clinician Deon Pilling, RN Compression Type: Three Layer Post Procedure Diagnosis Same as Pre-procedure Electronic Signature(s) Signed: 03/28/2020 4:21:34 PM By: Kela Millin Entered By: Kela Millin on 03/28/2020 09:19:03 -------------------------------------------------------------------------------- Compression Therapy Details Patient Name: Date of Service: [name] 03/28/2020 8:45 AM Medical Record [id number] Patient Account Number: [id number] Date of Birth/Sex: Treating RN: Aug 25, 1933 (86 y.o. [name] Primary Care Jarman Litton: Patrina Levering Other Clinician: Referring Nyomi Howser: Treating Nuh Lipton/Extender:Robson, Secundino Ginger, Hal T Weeks in Treatment: 14 Compression Therapy Performed for Wound Wound #5 Left,Lateral Lower Leg Assessment: Performed By: Clinician Deon Pilling, RN Compression Type: Three Layer Post Procedure Diagnosis Same as Pre-procedure Electronic Signature(s) Signed: 03/28/2020 4:21:34 PM By: Kela Millin Entered By: Kela Millin on 03/28/2020 09:19:03 -------------------------------------------------------------------------------- Encounter Discharge Information Details Patient Name: Date of Service: [name] 03/28/2020 8:45 AM Medical Record [id number] Patient Account Number: [id number] Date of Birth/Sex: Treating RN: 1933-10-15 (86 y.o. [name] Primary Care Giani Betzold: Patrina Levering Other Clinician: Referring Hanif Radin: Treating Daisee Centner/Extender:Robson, Secundino Ginger, Hal T Weeks in Treatment: 14 Encounter Discharge Information Items Post Procedure Vitals Discharge Condition: Stable Temperature (F): 97.8 Ambulatory Status: Ambulatory Pulse (bpm): 59 Discharge Destination: Home Respiratory Rate (breaths/min): 18 Transportation: Private Auto Blood  Pressure (mmHg): 162/78 Accompanied By: self Schedule Follow-up Appointment: Yes Clinical Summary of Care: Electronic Signature(s) Signed: 03/28/2020 4:06:47 PM By: Deon Pilling Entered By: Deon Pilling on 03/28/2020 09:32:13 -------------------------------------------------------------------------------- Lower Extremity Assessment Details Patient Name: Date of Service: [name] 03/28/2020 8:45 AM Medical Record [id number] Patient Account Number: [id number] Date of Birth/Sex: Treating RN: [birth year] (86 y.o. [name] Primary Care Nigel Ericsson: Patrina Levering Other Clinician: Referring Emmit Oriley: Treating Alastair Hennes/Extender:Robson, Secundino Ginger, Hal T Weeks in Treatment: 14 Edema Assessment Assessed: [Left: No] [Right: No] Edema: [Left: No] [Right: No] Calf Left: Right: Point of Measurement: 42 cm From Medial Instep 30.4 cm 30 cm Ankle Left: Right: Point of Measurement: 13  cm From Medial Instep 19.5 cm 18.5 cm Vascular Assessment Pulses: Dorsalis Pedis Palpable: [Left:Yes] [Right:Yes] Electronic Signature(s) Signed: 03/28/2020 4:22:27 PM By: [name] RN, BSN Entered By: [name] on 03/28/2020 09:08:56 -------------------------------------------------------------------------------- Multi Wound Chart Details Patient Name: Date of Service: [name] 03/28/2020 8:45 AM Medical Record [id number] Patient Account Number: [id number] Date of Birth/Sex: Treating RN: [birth year] (86 y.o. [name] Primary Care Alsace Dowd: Lajean Manes T Other Clinician: Referring Teniqua Marron: Treating Margi Edmundson/Extender:Robson, Secundino Ginger, Hal T Weeks in Treatment: 14 Vital Signs Height(in): 69 Pulse(bpm): 59 Weight(lbs): 163 Blood Pressure(mmHg): 162/78 Body Mass Index(BMI): 24 Temperature(F): 97.8 Respiratory 18 Rate(breaths/min): Photos: [3:No Photos] [4:No Photos] [5:No Photos] Wound Location: [3:Right, Lateral Lower Leg]  [4:Left, Medial Foot] [5:Left, Lateral Lower Leg] Wounding Event: [3:Gradually Appeared] [4:Shear/Friction] [5:Gradually Appeared] Primary Etiology: [3:Venous Leg Ulcer] [4:Pressure Ulcer] [5:Venous Leg Ulcer] Comorbid History: [3:Cataracts, Anemia, Arrhythmia, Congestive Heart Failure, Hypertension, Heart Failure, Hypertension, Heart Failure, Hypertension, Peripheral Venous Disease, Peripheral Venous Disease, Peripheral Venous Disease, Received Radiation]  [4:Cataracts, Anemia, Arrhythmia, Congestive Received Radiation] [5:Cataracts, Anemia, Arrhythmia, Congestive Received Radiation] Date Acquired: [3:11/19/2019] [4:01/19/2020] [5:03/21/2020] Weeks of Treatment: [3:14] [4:9] [5:1] Wound Status: [3:Open] [4:Open] [5:Open] Measurements L x W x D 0.3x0.2x0.1 [4:0.1x0.1x0.1] [5:1.2x1x0.1] (cm) Area (cm) : [3:0.047] [4:0.008] [5:0.942] Volume (cm) : [3:0.005] [4:0.001] [5:0.094] % Reduction in Area: [3:91.50%] [4:87.30%] [5:-87.30%] % Reduction in Volume: 90.90% [4:83.30%] [5:-88.00%] Classification: [3:Full Thickness Without Exposed Support Structures] [4:Category/Stage II] [5:Unclassifiable] Exudate Amount: [3:Small] [4:Small] [5:Small] Exudate Type: [3:Serosanguineous] [4:Serous] [5:Serosanguineous] Exudate Color: [3:red, brown] [4:amber] [5:red, brown] Wound Margin: [3:Flat and Intact] [4:Flat and Intact] [5:Flat and Intact] Granulation Amount: [3:Large (67-100%)] [4:Large (67-100%)] [5:None Present (0%)] Granulation Quality: [3:Red] [4:Pale] [5:N/A] Necrotic Amount: [3:None Present (0%)] [4:None Present (0%)] [5:Large (67-100%)] Necrotic Tissue: [3:N/A] [4:N/A] [5:Eschar, Adherent Slough] Exposed Structures: [3:Fascia: No Fat Layer (Subcutaneous Tissue) Exposed: Yes Tissue) Exposed: No Tendon: No Muscle: No Joint: No Bone: No] [4:Fat Layer (Subcutaneous Fascia: No Fascia: No Tendon: No Muscle: No Joint: No Bone: No] [5:Fat Layer (Subcutaneous Tissue)  Exposed: No Tendon: No Muscle: No Joint:  No Bone: No] Epithelialization: [3:Large (67-100%)] [4:Large (67-100%)] [5:None] Debridement: [3:N/A] [4:N/A] [5:Debridement - Excisional] Pre-procedure [3:N/A] [4:N/A] [5:09:15] Verification/Time Out Taken: Pain Control: [3:N/A] [4:N/A] [5:Other] Tissue Debrided: [3:N/A] [4:N/A] [5:Subcutaneous] Level: [3:N/A] [4:N/A] [5:Skin/Subcutaneous Tissue] Debridement Area (sq cm):N/A [4:N/A] [5:1.2] Instrument: [3:N/A] [4:N/A] [5:Curette] Bleeding: [3:N/A] [4:N/A] [5:Minimum] Hemostasis Achieved: [3:N/A] [4:N/A] [5:Pressure] Procedural Pain: [3:N/A] [4:N/A] [5:2] Post Procedural Pain: [3:N/A] [4:N/A] [5:0] Debridement Treatment N/A [4:N/A] [5:Procedure was tolerated] Response: [5:well] Post Debridement [3:N/A] [4:N/A] [5:1.2x1x0.1] Measurements L x W x D (cm) Post Debridement [3:N/A] [4:N/A] [5:0.094] Volume: (cm) Procedures Performed: Compression Therapy [4:N/A] [5:Compression Therapy Debridement] Treatment Notes Electronic Signature(s) Signed: 03/28/2020 4:21:34 PM By: Kela Millin Signed: 03/29/2020 6:55:41 AM By: Linton Ham MD Entered By: Linton Ham on 03/28/2020 09:23:53 -------------------------------------------------------------------------------- Multi-Disciplinary Care Plan Details Patient Name: Date of Service: [name] 03/28/2020 8:45 AM Medical Record [id number] Patient Account Number: [id number] Date of Birth/Sex: Treating RN: 1933-02-01 (86 y.o. [name] Primary Care Timm Bonenberger: Patrina Levering Other Clinician: Referring Talitha Dicarlo: Treating Jlynn Ly/Extender:Robson, Secundino Ginger, Hal T Weeks in Treatment: 14 Active Inactive Abuse / Safety / Falls / Self Care Management Nursing Diagnoses: Potential for falls Goals: Patient/caregiver will verbalize understanding of skin care regimen Date Initiated: 12/20/2019 Date Inactivated: 02/14/2020 Target Resolution Date: 02/22/2020 Goal Status: Met Patient/caregiver will  verbalize/demonstrate understanding of what to do in case of emergency Date Initiated: 12/20/2019 Target Resolution Date: 04/25/2020 Goal Status: Active Interventions:  Assess fall risk on admission and as needed Provide education on fall prevention Notes: Pain, Acute or Chronic Nursing Diagnoses: Pain, acute or chronic: actual or potential Potential alteration in comfort, pain Goals: Patient will verbalize adequate pain control and receive pain control interventions during procedures as needed Date Initiated: 12/20/2019 Date Inactivated: 02/14/2020 Target Resolution Date: 02/22/2020 Goal Status: Met Patient/caregiver will verbalize comfort level met Date Initiated: 12/20/2019 Target Resolution Date: 04/25/2020 Goal Status: Active Interventions: Complete pain assessment as per visit requirements Provide education on pain management Notes: Wound/Skin Impairment Nursing Diagnoses: Knowledge deficit related to ulceration/compromised skin integrity Goals: Patient/caregiver will verbalize understanding of skin care regimen Date Initiated: 12/20/2019 Target Resolution Date: 04/25/2020 Goal Status: Active Interventions: Assess patient/caregiver ability to perform ulcer/skin care regimen upon admission and as needed Provide education on ulcer and skin care Treatment Activities: Skin care regimen initiated : 12/20/2019 Topical wound management initiated : 12/20/2019 Notes: Electronic Signature(s) Signed: 03/28/2020 4:21:34 PM By: Kela Millin Entered By: Kela Millin on 03/28/2020 08:49:51 -------------------------------------------------------------------------------- Pain Assessment Details Patient Name: Date of Service: [name] 03/28/2020 8:45 AM Medical Record [id number] Patient Account Number: [id number] Date of Birth/Sex: Treating RN: 1933-01-14 (86 y.o. [name] Primary Care Jereme Loren: Patrina Levering Other Clinician: Referring Carmelle Bamberg: Treating  Exie Chrismer/Extender:Robson, Secundino Ginger, Hal T Weeks in Treatment: 14 Active Problems Location of Pain Severity and Description of Pain Patient Has Paino Yes Site Locations Pain Location: Pain in Ulcers With Dressing Change: Yes Duration of the Pain. Constant / Intermittento Intermittent Rate the pain. Current Pain Level: 3 Character of Pain Describe the Pain: Burning Pain Management and Medication Current Pain Management: Medication: Yes Cold Application: No Rest: No Massage: No Activity: No T.E.N.S.: No Heat Application: No Leg drop or elevation: No Is the Current Pain Management Adequate: Adequate How does your wound impact your activities of daily livingo Sleep: No Bathing: No Appetite: No Relationship With Others: No Bladder Continence: No Emotions: No Bowel Continence: No Work: No Toileting: No Drive: No Dressing: No Hobbies: No Electronic Signature(s) Signed: 03/28/2020 4:22:27 PM By: [name] RN, BSN Entered By: [name] on 03/28/2020 08:54:08 -------------------------------------------------------------------------------- Patient/Caregiver Education Details Patient Name: Date of Service: [name] 4/16/2021andnbsp8:45 AM Medical Record [id number] Patient Account Number: [id number] Date of Birth/Gender: [birth year] (87 y.o. F) Treating RN: Kela Millin Primary Care Physician: Patrina Levering Other Clinician: Referring Physician: Treating Physician/Extender:Robson, Secundino Ginger, Junius Creamer in Treatment: 14 Education Assessment Education Provided To: Patient Education Topics Provided Pain: Methods: Explain/Verbal Responses: State content correctly Safety: Methods: Explain/Verbal Responses: State content correctly Wound/Skin Impairment: Methods: Explain/Verbal Responses: State content correctly Electronic Signature(s) Signed: 03/28/2020 4:21:34 PM By: Kela Millin Entered By: Kela Millin on  03/28/2020 08:50:08 -------------------------------------------------------------------------------- Wound Assessment Details Patient Name: Date of Service: [name] 03/28/2020 8:45 AM Medical Record [id number] Patient Account Number: [id number] Date of Birth/Sex: Treating RN: 03-29-1933 (86 y.o. [name] Primary Care Emon Lance: Lajean Manes T Other Clinician: Referring Pawel Soules: Treating Aubert Choyce/Extender:Robson, Secundino Ginger, Hal T Weeks in Treatment: 14 Wound Status Wound Number: 3 Primary Venous Leg Ulcer Etiology: Wound Location: Right, Lateral Lower Leg Wound Open Wounding Event: Gradually Appeared Status: Date Acquired: 11/19/2019 Comorbid Cataracts, Anemia, Arrhythmia, Congestive Weeks Of Treatment: 14 History: Heart Failure, Hypertension, Peripheral Clustered Wound: No Venous Disease, Received Radiation Photos Wound Measurements Length: (cm) 0.3 % Reducti Width: (cm) 0.2 % Reducti Depth: (cm) 0.1 Epithelia Area: (cm) 0.047 Tunnelin Volume: (cm) 0.005 Undermi Wound Description Full Thickness Without Exposed Support Foul Od Classification:  Structures Slough/ Wound Flat and Intact Margin: Exudate Small Amount: Exudate Serosanguineous Type: Exudate red, brown Color: Wound Bed Granulation Amount: Large (67-100%) Granulation Quality: Red Fascia E Necrotic Amount: None Present (0%) Fat Laye Tendon E Muscle E Joint Ex Bone Exp or After Cleansing: No Fibrino No Exposed Structure xposed: No r (Subcutaneous Tissue) Exposed: No xposed: No xposed: No posed: No osed: No on in Area: 91.5% on in Volume: 90.9% lization: Large (67-100%) g: No ning: No Treatment Notes Wound #3 (Right, Lateral Lower Leg) 1. Cleanse With Wound Cleanser Soap and water 2. Periwound Care Moisturizing lotion TCA Cream 3. Primary Dressing Applied Endoform 4. Secondary Dressing Dry Gauze 6. Support Layer Applied 3 layer compression  wrap Electronic Signature(s) Signed: 03/28/2020 4:22:27 PM By: [name] RN, BSN Signed: 04/01/2020 1:29:28 PM By: Sandre Kitty Entered By: Sandre Kitty on 03/28/2020 15:42:39 -------------------------------------------------------------------------------- Wound Assessment Details Patient Name: Date of Service: [name] 03/28/2020 8:45 AM Medical Record [id number] Patient Account Number: [id number] Date of Birth/Sex: Treating RN: June 07, 1933 (86 y.o. [name] Primary Care Leocadio Heal: Lajean Manes T Other Clinician: Referring Joyelle Siedlecki: Treating Eraina Winnie/Extender:Robson, Secundino Ginger, Hal T Weeks in Treatment: 14 Wound Status Wound Number: 4 Primary Pressure Ulcer Etiology: Wound Location: Left, Medial Foot Wound Open Wounding Event: Shear/Friction Status: Date Acquired: 01/19/2020 Comorbid Cataracts, Anemia, Arrhythmia, Congestive Weeks Of Treatment: 9 History: Heart Failure, Hypertension, Peripheral Clustered Wound: No Venous Disease, Received Radiation Photos Wound Measurements Length: (cm) 0.1 % Reduction in Ar Width: (cm) 0.1 % Reduction in Vo Depth: (cm) 0.1 Epithelialization Area: (cm) 0.008 Tunneling: Volume: (cm) 0.001 Undermining: Wound Description Classification: Category/Stage II Foul Odor After C Wound Margin: Flat and Intact Slough/Fibrino Exudate Amount: Small Exudate Type: Serous Exudate Color: amber Wound Bed Granulation Amount: Large (67-100%) Granulation Quality: Pale Fascia Exposed: Necrotic Amount: None Present (0%) Fat Layer (Subcut Tendon Exposed: Muscle Exposed: Joint Exposed: Bone Exposed: leansing: No No Exposed Structure No aneous Tissue) Exposed: Yes No No No No ea: 87.3% lume: 83.3% : Large (67-100%) No No Treatment Notes Wound #4 (Left, Medial Foot) 1. Cleanse With Wound Cleanser Soap and water 2. Periwound Care Moisturizing lotion TCA Cream 3. Primary Dressing  Applied Endoform 4. Secondary Dressing Dry Gauze Foam 6. Support Layer Applied 3 layer compression wrap Notes netting. Electronic Signature(s) Signed: 03/28/2020 4:22:27 PM By: [name] RN, BSN Signed: 04/01/2020 1:29:28 PM By: Sandre Kitty Entered By: Sandre Kitty on 03/28/2020 15:42:59 -------------------------------------------------------------------------------- Wound Assessment Details Patient Name: Date of Service: [name] 03/28/2020 8:45 AM Medical Record [id number] Patient Account Number: [id number] Date of Birth/Sex: Treating RN: October 21, 1933 (86 y.o. [name] Primary Care Cai Anfinson: Lajean Manes T Other Clinician: Referring Amandalynn Pitz: Treating Zabian Swayne/Extender:Robson, Secundino Ginger, Hal T Weeks in Treatment: 14 Wound Status Wound Number: 5 Primary Venous Leg Ulcer Etiology: Wound Location: Left, Lateral Lower Leg Wound Open Wounding Event: Gradually Appeared Status: Date Acquired: 03/21/2020 Comorbid Cataracts, Anemia, Arrhythmia, Congestive Weeks Of Treatment: 1 History: Heart Failure, Hypertension, Peripheral Clustered Wound: No Venous Disease, Received Radiation Photos Wound Measurements Length: (cm) 1.2 Width: (cm) 1 Depth: (cm) 0.1 Area: (cm) 0.942 Volume: (cm) 0.094 Wound Description Classification: Unclassifiable Wound Margin: Flat and Intact Exudate Amount: Small Exudate Type: Serosanguineous Exudate Color: red, brown Wound Bed Granulation Amount: None Present (0%) Necrotic Amount: Large (67-100%) Necrotic Quality: Eschar, Adherent Slough After Cleansing: No brino Yes Exposed Structure posed: No (Subcutaneous Tissue) Exposed: No posed: No posed: No osed: No sed: No % Reduction in Area: -87.3% % Reduction in Volume: -  88% Epithelialization: None Tunneling: No Undermining: No Foul Odor Slough/Fi Fascia Ex Fat Layer Tendon Ex Muscle Ex Joint Exp Bone Expo Treatment Notes Wound #5 (Left,  Lateral Lower Leg) 1. Cleanse With Wound Cleanser Soap and water 2. Periwound Care Moisturizing lotion TCA Cream 3. Primary Dressing Applied Other primary dressing (specifiy in notes) 4. Secondary Dressing Dry Gauze 6. Support Layer Applied 3 layer compression wrap Notes primary dressing cutimed sorbact swab. netting. Electronic Signature(s) Signed: 03/28/2020 4:22:27 PM By: [name] RN, BSN Signed: 04/01/2020 1:29:28 PM By: Sandre Kitty Entered By: Sandre Kitty on 03/28/2020 15:43:20 -------------------------------------------------------------------------------- Vitals Details Patient Name: Date of Service: [name] 03/28/2020 8:45 AM Medical Record [id number] Patient Account Number: [id number] Date of Birth/Sex: Treating RN: [birth year] (86 y.o. [name] Primary Care Ciaran Begay: Lajean Manes T Other Clinician: Referring Ludy Messamore: Treating Silvia Markuson/Extender:Robson, Secundino Ginger, Hal T Weeks in Treatment: 14 Vital Signs Time Taken: 08:53 Temperature (F): 97.8 Height (in): 69 Pulse (bpm): 59 Weight (lbs): 163 Respiratory Rate (breaths/min): 18 Body Mass Index (BMI): 24.1 Blood Pressure (mmHg): 162/78 Reference Range: 80 - 120 mg / dl Electronic Signature(s) Signed: 03/28/2020 4:22:27 PM By: [name] RN, BSN Entered By: [name] on 03/28/2020 08:53:51

## 2020-04-04 ENCOUNTER — Encounter (HOSPITAL_BASED_OUTPATIENT_CLINIC_OR_DEPARTMENT_OTHER): Payer: Medicare Other | Admitting: Internal Medicine

## 2020-04-04 ENCOUNTER — Other Ambulatory Visit: Payer: Self-pay

## 2020-04-04 DIAGNOSIS — L97522 Non-pressure chronic ulcer of other part of left foot with fat layer exposed: Secondary | ICD-10-CM | POA: Diagnosis not present

## 2020-04-04 NOTE — Progress Notes (Signed)
[name] ([id number]) Visit Report for 04/04/2020 Debridement Details Patient Name: Date of Service: APPIE, DAMM 04/04/2020 3:15 PM Medical Record [id number] Patient Account Number: [id number] Date of Birth/Sex: Treating RN: 1933/04/29 (86 y.o. [name] Primary Care Provider: Patrina Levering Other Clinician: Referring Provider: Treating Provider/Extender:Tovia Kisner, Secundino Ginger, Hal T Weeks in Treatment: 15 Debridement Performed for Wound #5 Left,Lateral Lower Leg Assessment: Performed By: Physician Ricard Dillon., MD Debridement Type: Debridement Severity of Tissue Pre Fat layer exposed Debridement: Level of Consciousness (Pre- Awake and Alert procedure): Pre-procedure Verification/Time Out Taken: Yes - 16:00 Start Time: 16:00 Pain Control: Other : benxocaine, 20% Total Area Debrided (L x W): 1.7 (cm) x 0.7 (cm) = 1.19 (cm) Tissue and other material Viable, Non-Viable, Eschar, Slough, Subcutaneous, Slough debrided: Level: Skin/Subcutaneous Tissue Debridement Description: Excisional Instrument: Curette Bleeding: Moderate Hemostasis Achieved: Silver Nitrate End Time: 16:01 Procedural Pain: 0 Post Procedural Pain: 0 Response to Treatment: Procedure was tolerated well Level of Consciousness Awake and Alert (Post-procedure): Post Debridement Measurements of Total Wound Length: (cm) 1.7 Width: (cm) 0.7 Depth: (cm) 0.2 Volume: (cm) 0.187 Character of Wound/Ulcer Post Improved Debridement: Severity of Tissue Post Debridement: Fat layer exposed Post Procedure Diagnosis Same as Pre-procedure Electronic Signature(s) Signed: 04/04/2020 5:13:47 PM By: [name] Signed: 04/04/2020 5:22:40 PM By: Tracy Ham MD Entered By: [name] on 04/04/2020 16:52:51 -------------------------------------------------------------------------------- HPI Details Patient Name: Date of Service: [name] 04/04/2020 3:15  PM Medical Record [id number] Patient Account Number: [id number] Date of Birth/Sex: Treating RN: 16-May-1933 (86 y.o. [name] Primary Care Provider: Patrina Levering Other Clinician: Referring Provider: Treating Provider/Extender:[name], Secundino Ginger, Hal T Weeks in Treatment: 15 History of Present Illness HPI Description: ADMISSION 12/20/2019 This is an 86 year old woman referred by her primary physician Dr. Felipa Eth for review of a wound on her right lateral lower leg. She was actually in this clinic on 2 separate occasions in 2010 and 2012 cared for by Dr. Sherilyn Cooter. At that point in time she had wounds on her right leg as well. She tells [place] to 1 month ago she noticed a scab building up on her right lateral lower leg this opened into a wound. There was no overt cause of this no trauma, no infection that she is aware of. She has a history of chronic venous insufficiency and wears compression stockings fairly religiously indeed she is done well over the last 8 years since she was last in this clinic. She has been applying Vaseline on this and a covering phone. This is not progressing towards healing. Past medical history; interstitial lung disease, chronic atrial fibrillation status post pacemaker, osteoarthritis of the left knee, left breast CA, chronic repeat venous insufficiency. She takes Eliquis for her atrial fibrillation stroke prophylaxis. ABI in our clinic was 0.99 on the right 1/15; superficial wound on the right lateral calf in the setting of severe acute skin changes from chronic venous insufficiency and lymphedema. We used Iodoflex last week she complained of a lot of pain 1/22; this is a small but difficult wound on the right lateral calf in the setting of severe chronic venous insufficiency and secondary lymphedema. She continues to state the wounds things hurts when she is up on it but seems to be relieved by putting her leg up. I changed her to  [facility] last week because the Iodoflex seem to be causing stinging. 1/29. This wound appears to be contracting somewhat. Changes of chronic venous insufficiency with secondary lymphedema 2/12; not as  good as surface today and slightly bigger. I had to change her from Iodoflex to [facility] because of the stinging pain although she does not think it was any better on the [facility]. She comes into clinic today with a area on the medial left great toe. She said she noticed blood on her sock last Saturday. She had some form of bunion surgery by Dr. Ila Mcgill her podiatrist sometime in 2019 she said he "shave the area". I could not find his operative report although I did see reference to the bunion in the area. 2/19; somewhat improved wound on the right lateral lower leg however the wound she came in on the bunion of her left great toe is actually I think larger still somewhat inflamed. We have been using Iodoflex 2/26; not much improvement in either wound area which is the original venous wound on the right lateral lower leg and in the area on the bunion of her left great toe. This still looks somewhat inflamed and tender. We have been using Iodoflex with open much improvement. 3/4; in general both her wounds look better this includes the original venous insufficiency wound on the right lateral lower leg and the area on her tip of her bunion on the left great toe medial aspect of the MTP. Change to Hydrofera Blue last week 3/12; the patient still has a small geographic shaped wound on the right lateral lower leg. We have been using Hydrofera Blue but I changed her to endoform today. She also has the area in the tip of the bunion of the left great toe. We will try Hydrofera Blue here as well 3/19; the small geographic wound on the right lateral lower leg has not filled in. There is some surrounding induration we have noticed this previously. This may be venous inflammation but I  wonder about biopsying this if this is not closing up. The area over the left medial first MTP [bunion deformity] requires debridement. This is not closing in. I am not convinced she is offloading adequately 3/26; right lateral lower leg in the setting of severe venous insufficiency and also an area over the first MTP bunion deformity. No debridement is required. We have been using endoform 4/9; right lateral lower leg wound in the setting of severe venous insufficiency and also a refractory area over the first MTP bunion deformity. The area on the right lateral lower leg is just about closed there is still a minor open area here. She comes in today with a mirror image area on the left lateral lower leg. She says this started as a scab 2 weeks ago and is gradually morphed into an open wound very similar to what she has on the right leg. She has severe bilateral venous insufficiency with venous hypertension obvious from clinical exam. 4/16; her original wound the right lateral leg wound in the setting of chronic venous hypertension is a small open area but very small. We have been using endoform. Her wound over the left first MTP bunion deformity also appears to be small and closing in. Unfortunately she has a large relative area on the left lateral calf which was new last week. Very adherent debris over this we have been using Iodoflex however that may be contributing to the debris on the wound surface 4/23; the original wound is closed and the area on the left first metatarsal bunion deformity is also almost closed the new area from last week required debridement. She went for her reflux  studies that did not take her lower extremity wraps off. This is not helpful. She did have significant reflux in the right common femoral vein. I am not going to press this issue further. She clinically has severe venous hypertension Electronic Signature(s) Signed: 04/04/2020 5:22:40 PM By: Tracy Ham  MD Entered By: [name] on 04/04/2020 16:58:57 -------------------------------------------------------------------------------- Physical Exam Details Patient Name: Date of Service: Habermann, Jermeka J. 04/04/2020 3:15 PM Medical Record [id number] Patient Account Number: [id number] Date of Birth/Sex: Treating RN: 09-17-1933 (86 y.o. [name] Primary Care Provider: Patrina Levering Other Clinician: Referring Provider: Treating Provider/Extender:Braden Deloach, Secundino Ginger, Hal T Weeks in Treatment: 15 Constitutional Sitting or standing Blood Pressure is within target range for patient.. Pulse regular and within target range for patient.Marland Kitchen Respirations regular, non-labored and within target range.Marland Kitchen Appears in no distress. Notes Wound exam; the area we have been following is on the right lateral leg is closed The area on the bunion deformity on the left first MTP is also epithelialized. She has a necrotic surface on a new wound last week on the left lateral calf I used a #3 curette to debride this unroofed the venous bleeder. We had to use silver nitrate and prolonged compression with a pressure dressing for hemostasis. We will need to monitor this Electronic Signature(s) Signed: 04/04/2020 5:22:40 PM By: Tracy Ham MD Entered By: [name] on 04/04/2020 17:02:17 -------------------------------------------------------------------------------- Physician Orders Details Patient Name: Date of Service: [name] 04/04/2020 3:15 PM Medical Record [id number] Patient Account Number: [id number] Date of Birth/Sex: Treating RN: 01-01-1933 (86 y.o. [name] Primary Care Provider: Lajean Manes T Other Clinician: Referring Provider: Treating Provider/Extender:Shanyah Gattuso, Secundino Ginger, Junius Creamer in Treatment: 15 Verbal / Phone Orders: No Diagnosis Coding ICD-10 Coding Code Description I87.321 Chronic venous hypertension (idiopathic) with  inflammation of right lower extremity L97.812 Non-pressure chronic ulcer of other part of right lower leg with fat layer exposed L97.828 Non-pressure chronic ulcer of other part of left lower leg with other specified severity I87.322 Chronic venous hypertension (idiopathic) with inflammation of left lower extremity L97.522 Non-pressure chronic ulcer of other part of left foot with fat layer exposed Follow-up Appointments Return Appointment in 1 week. - Friday Dressing Change Frequency Wound #4 Left,Medial Foot Change Dressing every other day. - only to left medial foot wound Wound #5 Left,Lateral Lower Leg Do not change entire dressing for one week. Skin Barriers/Peri-Wound Care Moisturizing lotion TCA Cream or Ointment Wound Cleansing May shower with protection. - cast protector Primary Wound Dressing Wound #4 Left,Medial Foot Other: - foam to both left medial foot and right lateral lower leg Wound #5 Left,Lateral Lower Leg Cutimed Sorbact - moisten with hydrogel Secondary Dressing Wound #4 Left,Medial Foot Foam - or tape Dry Gauze Wound #5 Left,Lateral Lower Leg Dry Gauze Edema Control 3 Layer Compression System - Bilateral - wrapping the right as well until she brings stockings next week Avoid standing for long periods of time Elevate legs to the level of the heart or above for 30 minutes daily and/or when sitting, a frequency of: - throughout the day. Exercise regularly Support Garment 30-40 mm/Hg pressure to: - farrow wrap 4000 for to left leg Off-Loading Open toe surgical shoe to: - left Electronic Signature(s) Signed: 04/04/2020 5:13:47 PM By: [name] Signed: 04/04/2020 5:22:40 PM By: Tracy Ham MD Entered By: [name] on 04/04/2020 16:02:23 -------------------------------------------------------------------------------- Problem List Details Patient Name: Date of Service: [name] 04/04/2020 3:15 PM Medical Record [id number]  Patient Account Number:  [id number] Date of Birth/Sex: Treating RN: Dec 06, 1933 (86 y.o. [name] Primary Care Provider: Lajean Manes T Other Clinician: Referring Provider: Treating Provider/Extender:Aaleigha Bozza, Secundino Ginger, Junius Creamer in Treatment: 15 Active Problems ICD-10 Evaluated Encounter Code Description Active Date Today Diagnosis I87.321 Chronic venous hypertension (idiopathic) with 12/20/2019 No Yes inflammation of right lower extremity L97.812 Non-pressure chronic ulcer of other part of right lower 12/20/2019 No Yes leg with fat layer exposed L97.828 Non-pressure chronic ulcer of other part of left lower 03/21/2020 No Yes leg with other specified severity I87.322 Chronic venous hypertension (idiopathic) with 02/14/2020 No Yes inflammation of left lower extremity L97.522 Non-pressure chronic ulcer of other part of left foot 01/25/2020 No Yes with fat layer exposed Inactive Problems Resolved Problems Electronic Signature(s) Signed: 04/04/2020 5:22:40 PM By: Tracy Ham MD Entered By: [name] on 04/04/2020 16:52:34 -------------------------------------------------------------------------------- Progress Note Details Patient Name: Date of Service: [name] 04/04/2020 3:15 PM Medical Record [id number] Patient Account Number: [id number] Date of Birth/Sex: Treating RN: January 14, 1933 (86 y.o. [name] Primary Care Provider: Patrina Levering Other Clinician: Referring Provider: Treating Provider/Extender:Gearld Kerstein, Secundino Ginger, Hal T Weeks in Treatment: 15 Subjective History of Present Illness (HPI) ADMISSION 12/20/2019 This is an 86 year old woman referred by her primary physician Dr. Felipa Eth for review of a wound on her right lateral lower leg. She was actually in this clinic on 2 separate occasions in 2010 and 2012 cared for by Dr. Sherilyn Cooter. At that point in time she had wounds on her right leg as well. She tells [place] to 1 month  ago she noticed a scab building up on her right lateral lower leg this opened into a wound. There was no overt cause of this no trauma, no infection that she is aware of. She has a history of chronic venous insufficiency and wears compression stockings fairly religiously indeed she is done well over the last 8 years since she was last in this clinic. She has been applying Vaseline on this and a covering phone. This is not progressing towards healing. Past medical history; interstitial lung disease, chronic atrial fibrillation status post pacemaker, osteoarthritis of the left knee, left breast CA, chronic repeat venous insufficiency. She takes Eliquis for her atrial fibrillation stroke prophylaxis. ABI in our clinic was 0.99 on the right 1/15; superficial wound on the right lateral calf in the setting of severe acute skin changes from chronic venous insufficiency and lymphedema. We used Iodoflex last week she complained of a lot of pain 1/22; this is a small but difficult wound on the right lateral calf in the setting of severe chronic venous insufficiency and secondary lymphedema. She continues to state the wounds things hurts when she is up on it but seems to be relieved by putting her leg up. I changed her to [facility] last week because the Iodoflex seem to be causing stinging. 1/29. This wound appears to be contracting somewhat. Changes of chronic venous insufficiency with secondary lymphedema 2/12; not as good as surface today and slightly bigger. I had to change her from Iodoflex to [facility] because of the stinging pain although she does not think it was any better on the [facility]. ooShe comes into clinic today with a area on the medial left great toe. She said she noticed blood on her sock last Saturday. She had some form of bunion surgery by Dr. Ila Mcgill her podiatrist sometime in 2019 she said he "shave the area". I could not find his operative report although I  did  see reference to the bunion in the area. 2/19; somewhat improved wound on the right lateral lower leg however the wound she came in on the bunion of her left great toe is actually I think larger still somewhat inflamed. We have been using Iodoflex 2/26; not much improvement in either wound area which is the original venous wound on the right lateral lower leg and in the area on the bunion of her left great toe. This still looks somewhat inflamed and tender. We have been using Iodoflex with open much improvement. 3/4; in general both her wounds look better this includes the original venous insufficiency wound on the right lateral lower leg and the area on her tip of her bunion on the left great toe medial aspect of the MTP. Change to Hydrofera Blue last week 3/12; the patient still has a small geographic shaped wound on the right lateral lower leg. We have been using Hydrofera Blue but I changed her to endoform today. She also has the area in the tip of the bunion of the left great toe. We will try Hydrofera Blue here as well 3/19; the small geographic wound on the right lateral lower leg has not filled in. There is some surrounding induration we have noticed this previously. This may be venous inflammation but I wonder about biopsying this if this is not closing up. The area over the left medial first MTP [bunion deformity] requires debridement. This is not closing in. I am not convinced she is offloading adequately 3/26; right lateral lower leg in the setting of severe venous insufficiency and also an area over the first MTP bunion deformity. No debridement is required. We have been using endoform 4/9; right lateral lower leg wound in the setting of severe venous insufficiency and also a refractory area over the first MTP bunion deformity. The area on the right lateral lower leg is just about closed there is still a minor open area here. She comes in today with a mirror image area on the left  lateral lower leg. She says this started as a scab 2 weeks ago and is gradually morphed into an open wound very similar to what she has on the right leg. She has severe bilateral venous insufficiency with venous hypertension obvious from clinical exam. 4/16; her original wound the right lateral leg wound in the setting of chronic venous hypertension is a small open area but very small. We have been using endoform. Her wound over the left first MTP bunion deformity also appears to be small and closing in. Unfortunately she has a large relative area on the left lateral calf which was new last week. Very adherent debris over this we have been using Iodoflex however that may be contributing to the debris on the wound surface 4/23; the original wound is closed and the area on the left first metatarsal bunion deformity is also almost closed the new area from last week required debridement. She went for her reflux studies that did not take her lower extremity wraps off. This is not helpful. She did have significant reflux in the right common femoral vein. I am not going to press this issue further. She clinically has severe venous hypertension Objective Constitutional Sitting or standing Blood Pressure is within target range for patient.. Pulse regular and within target range for patient.Marland Kitchen Respirations regular, non-labored and within target range.Marland Kitchen Appears in no distress. Vitals Time Taken: 3:40 PM, Height: 69 in, Weight: 163 lbs, BMI: 24.1, Temperature: 97.4 F, Pulse:  68 bpm, Respiratory Rate: 18 breaths/min, Blood Pressure: 111/56 mmHg. General Notes: Wound exam; the area we have been following is on the right lateral leg is closed ooThe area on the bunion deformity on the left first MTP is also epithelialized. ooShe has a necrotic surface on a new wound last week on the left lateral calf I used a #3 curette to debride this unroofed the venous bleeder. We had to use silver nitrate and prolonged  compression with a pressure dressing for hemostasis. We will need to monitor this Integumentary (Hair, Skin) Wound #3 status is Healed - Epithelialized. Original cause of wound was Gradually Appeared. The wound is located on the Right,Lateral Lower Leg. The wound measures 0cm length x 0cm width x 0cm depth; 0cm^2 area and 0cm^3 volume. There is no tunneling or undermining noted. There is a none present amount of drainage noted. The wound margin is flat and intact. There is no granulation within the wound bed. There is no necrotic tissue within the wound bed. Wound #4 status is Open. Original cause of wound was Shear/Friction. The wound is located on the Left,Medial Foot. The wound measures 0.7cm length x 0.7cm width x 0.1cm depth; 0.385cm^2 area and 0.038cm^3 volume. There is Fat Layer (Subcutaneous Tissue) Exposed exposed. There is no tunneling or undermining noted. There is a small amount of serous drainage noted. The wound margin is flat and intact. There is large (67-100%) pale granulation within the wound bed. There is no necrotic tissue within the wound bed. Wound #5 status is Open. Original cause of wound was Gradually Appeared. The wound is located on the Left,Lateral Lower Leg. The wound measures 1.7cm length x 0.7cm width x 0.2cm depth; 0.935cm^2 area and 0.187cm^3 volume. There is no tunneling or undermining noted. There is a small amount of serosanguineous drainage noted. The wound margin is flat and intact. There is no granulation within the wound bed. There is a large (67-100%) amount of necrotic tissue within the wound bed including Eschar and Adherent Slough. Assessment Active Problems ICD-10 Chronic venous hypertension (idiopathic) with inflammation of right lower extremity Non-pressure chronic ulcer of other part of right lower leg with fat layer exposed Non-pressure chronic ulcer of other part of left lower leg with other specified severity Chronic venous hypertension  (idiopathic) with inflammation of left lower extremity Non-pressure chronic ulcer of other part of left foot with fat layer exposed Procedures Wound #5 Pre-procedure diagnosis of Wound #5 is a Venous Leg Ulcer located on the Left,Lateral Lower Leg .Severity of Tissue Pre Debridement is: Fat layer exposed. There was a Excisional Skin/Subcutaneous Tissue Debridement with a total area of 1.19 sq cm performed by Ricard Dillon., MD. With the following instrument(s): Curette to remove Viable and Non-Viable tissue/material. Material removed includes Eschar, Subcutaneous Tissue, and Slough after achieving pain control using Other (benxocaine, 20%). No specimens were taken. A time out was conducted at 16:00, prior to the start of the procedure. A Moderate amount of bleeding was controlled with Silver Nitrate. The procedure was tolerated well with a pain level of 0 throughout and a pain level of 0 following the procedure. Post Debridement Measurements: 1.7cm length x 0.7cm width x 0.2cm depth; 0.187cm^3 volume. Character of Wound/Ulcer Post Debridement is improved. Severity of Tissue Post Debridement is: Fat layer exposed. Post procedure Diagnosis Wound #5: Same as Pre-Procedure Pre-procedure diagnosis of Wound #5 is a Venous Leg Ulcer located on the Left,Lateral Lower Leg . There was a Three Layer Compression Therapy Procedure by Rolin Barry,  Tammi Klippel, Therapist, sports. Post procedure Diagnosis Wound #5: Same as Pre-Procedure Wound #3 Pre-procedure diagnosis of Wound #3 is a Venous Leg Ulcer located on the Right,Lateral Lower Leg . There was a Three Layer Compression Therapy Procedure by Deon Pilling, RN. Post procedure Diagnosis Wound #3: Same as Pre-Procedure Plan Follow-up Appointments: Return Appointment in 1 week. - Friday Dressing Change Frequency: Wound #4 Left,Medial Foot: Change Dressing every other day. - only to left medial foot wound Wound #5 Left,Lateral Lower Leg: Do not change entire dressing for  one week. Skin Barriers/Peri-Wound Care: Moisturizing lotion TCA Cream or Ointment Wound Cleansing: May shower with protection. - cast protector Primary Wound Dressing: Wound #4 Left,Medial Foot: Other: - foam to both left medial foot and right lateral lower leg Wound #5 Left,Lateral Lower Leg: Cutimed Sorbact - moisten with hydrogel Secondary Dressing: Wound #4 Left,Medial Foot: Foam - or tape Dry Gauze Wound #5 Left,Lateral Lower Leg: Dry Gauze Edema Control: 3 Layer Compression System - Bilateral - wrapping the right as well until she brings stockings next week Avoid standing for long periods of time Elevate legs to the level of the heart or above for 30 minutes daily and/or when sitting, a frequency of: - throughout the day. Exercise regularly Support Garment 30-40 mm/Hg pressure to: - farrow wrap 4000 for to left leg Off-Loading: Open toe surgical shoe to: - left 1. Interestingly your wound on the right leg which was her initial wound is closed. We are going to wrap this leg this week and asked her to bring her stockings next week 2. First MTP is also closed or epithelialized. I will check this next week were going to use border foam on this area to protect it 3. The new wound from last week is necrotic. Debriding this caused a venous bleeder which is compatible with her known venous hypertension. We used silver nitrate and compression to cauterize this. 4. Continue Sorbact of the left lower leg 5. Bring stockings next week Electronic Signature(s) Signed: 04/04/2020 5:22:40 PM By: Tracy Ham MD Entered By: [name] on 04/04/2020 17:03:35 -------------------------------------------------------------------------------- SuperBill Details Patient Name: Date of Service: [name] 04/04/2020 Medical Record [id number] Patient Account Number: [id number] Date of Birth/Sex: Treating RN: [birth year] (86 y.o. [name] Primary Care Provider:  Lajean Manes T Other Clinician: Referring Provider: Treating Provider/Extender:Pallavi Clifton, Secundino Ginger, Hal T Weeks in Treatment: 15 Diagnosis Coding ICD-10 Codes Code Description I87.321 Chronic venous hypertension (idiopathic) with inflammation of right lower extremity L97.812 Non-pressure chronic ulcer of other part of right lower leg with fat layer exposed L97.828 Non-pressure chronic ulcer of other part of left lower leg with other specified severity I87.322 Chronic venous hypertension (idiopathic) with inflammation of left lower extremity L97.522 Non-pressure chronic ulcer of other part of left foot with fat layer exposed Facility Procedures The patient participates with Medicare or their insurance follows the Medicare Facility Guidelines: CPT4 Code Description Modifier Quantity [id number] 11042 - DEB SUBQ TISSUE 20 SQ CM/< 1 ICD-10 Diagnosis Description L97.828 Non-pressure chronic ulcer of  other part of left lower leg with other specified severity Physician Procedures CPT4 Code Description: [id number] 11042 - WC PHYS SUBQ TISS 20 SQ CM ICD-10 Diagnosis Description L97.828 Non-pressure chronic ulcer of other part of left lower leg wit severity Modifier: h other specified Quantity: 1 Electronic Signature(s) Signed: 04/04/2020 5:22:40 PM By: Tracy Ham MD Entered By: [name] on 04/04/2020 17:03:49

## 2020-04-08 NOTE — Progress Notes (Signed)
[name], [name] ([id number]) Visit Report for 04/04/2020 Arrival Information Details Patient Name: Date of Service: [name], [name] 04/04/2020 3:15 PM Medical Record [id number] Patient Account Number: [id number] Date of Birth/Sex: Treating RN: [name] (86 y.o. [name] Primary Care [name]: [name] Other Clinician: Referring [name]: Treating [name]/Extender:[name] in Treatment: 15 Visit Information History Since Last Visit All ordered tests and consults were completed: No Patient Arrived: Ambulatory Added or deleted any medications: No Arrival Time: 15:16 Any new allergies or adverse reactions: No Accompanied By: self Had a fall or experienced change in No Transfer Assistance: None activities of daily living that may affect Patient Identification Verified: Yes risk of falls: Secondary Verification Process Yes Signs or symptoms of abuse/neglect since last visito No Completed: Hospitalized since last visit: No Patient Requires Transmission-Based No Implantable device outside of the clinic excluding No Precautions: cellular tissue based products placed in the center Patient Has Alerts: Yes since last visit: Patient Alerts: Patient on Blood Has Dressing in Place as Prescribed: Yes Thinner Has Compression in Place as Prescribed: Yes Right ABI:0.99 Pain Present Now: No Electronic Signature(s) Signed: 04/08/2020 5:28:23 PM By: Tracy Coria RN Entered By: [name] on 04/04/2020 15:40:31 -------------------------------------------------------------------------------- Compression Therapy Details Patient Name: Date of Service: BRISTOL, OSENTOSKI 04/04/2020 3:15 PM Medical Record [id number] Patient Account Number: [id number] Date of Birth/Sex: Treating RN: [name] (86 y.o. [name] Primary Care Korianna Washer: [name] Other Clinician: Referring [name]: Treating [name]/Extender:[name] in Treatment: 15 Compression Therapy Performed for Wound Wound #3 Right,Lateral Lower Leg Assessment: Performed By: Clinician Tracy Pilling, RN Compression Type: Three Layer Post Procedure Diagnosis Same as Pre-procedure Electronic Signature(s) Signed: 04/04/2020 5:13:47 PM By: [name] Entered By: [name] on 04/04/2020 15:57:20 -------------------------------------------------------------------------------- Compression Therapy Details Patient Name: Date of Service: [name], [name] 04/04/2020 3:15 PM Medical Record [id number] Patient Account Number: [id number] Date of Birth/Sex: Treating RN: [name] (86 y.o. [name] Primary Care Caresse Sedivy: [name] Other Clinician: Referring [name]: Treating [name]/Extender:[name] in Treatment: 15 Compression Therapy Performed for Wound Wound #5 Left,Lateral Lower Leg Assessment: Performed By: Clinician Tracy Pilling, RN Compression Type: Three Layer Post Procedure Diagnosis Same as Pre-procedure Electronic Signature(s) Signed: 04/04/2020 5:13:47 PM By: [name] Entered By: [name] on 04/04/2020 15:57:20 -------------------------------------------------------------------------------- Encounter Discharge Information Details Patient Name: Date of Service: [name] 04/04/2020 3:15 PM Medical Record [id number] Patient Account Number: [id number] Date of Birth/Sex: Treating RN: [birth year] (86 y.o. [name] Primary Care Cesia Green: [name] Other Clinician: Referring [name]: Treating [name]/Extender:[name] in Treatment: 15 Encounter Discharge Information Items Post Procedure Vitals Discharge Condition: Stable Temperature (F): 97.4 Ambulatory Status: Ambulatory Pulse (bpm): 68 Discharge Destination: Home Respiratory Rate (breaths/min):  18 Transportation: Private Auto Blood Pressure (mmHg): 111/56 Accompanied By: self Schedule Follow-up Appointment: Yes Clinical Summary of Care: Electronic Signature(s) Signed: 04/04/2020 5:04:12 PM By: [name] Entered By: [name] on 04/04/2020 16:38:18 -------------------------------------------------------------------------------- Lower Extremity Assessment Details Patient Name: Date of Service: [name], [name] 04/04/2020 3:15 PM Medical Record [id number] Patient Account Number: [id number] Date of Birth/Sex: Treating RN: [name] (86 y.o. [name] Primary Care [name]: [name] Other Clinician: Referring [name]: Treating Azriella Mattia/Extender:[name] in Treatment: 15 Edema Assessment Assessed: [Left: No] [Right: No] Edema: [Left: No] [Right: No] Calf Left: Right: Point of Measurement: 42 cm From Medial Instep 30.4 cm 30 cm  Ankle Left: Right: Point of Measurement: 13 cm From Medial Instep 19.5 cm 18.5 cm Electronic Signature(s) Signed: 04/08/2020 5:28:23 PM By: Tracy Coria RN Entered By: [name] on 04/04/2020 15:41:17 -------------------------------------------------------------------------------- Multi Wound Chart Details Patient Name: Date of Service: [name] 04/04/2020 3:15 PM Medical Record [id number] Patient Account Number: [id number] Date of Birth/Sex: Treating RN: [name] (86 y.o. [name] Primary Care [name]: [name] Other Clinician: Referring [name]: Treating [name]/Extender:[name] in Treatment: 15 Vital Signs Height(in): 69 Pulse(bpm): 44 Weight(lbs): 163 Blood Pressure(mmHg):111/56 Body Mass Index(BMI): 24 Temperature(F): 97.4 Respiratory 18 Rate(breaths/min): Photos: [3:No Photos] [4:No Photos] [5:No Photos] Wound Location: [3:Right, Lateral Lower Leg] [4:Left, Medial Foot] [5:Left, Lateral Lower  Leg] Wounding Event: [3:Gradually Appeared] [4:Shear/Friction] [5:Gradually Appeared] Primary Etiology: [3:Venous Leg Ulcer] [4:Pressure Ulcer] [5:Venous Leg Ulcer] Comorbid History: [3:Cataracts, Anemia, Arrhythmia, Congestive Heart Failure, Hypertension, Heart Failure, Hypertension, Heart Failure, Hypertension, Peripheral Venous Disease, Peripheral Venous Disease, Peripheral Venous Disease, Received Radiation]  [4:Cataracts, Anemia, Arrhythmia, Congestive Received Radiation] [5:Cataracts, Anemia, Arrhythmia, Congestive Received Radiation] Date Acquired: [3:11/19/2019] [4:01/19/2020] [5:03/21/2020] Green of Treatment: [3:15] [4:10] [5:2] Wound Status: [3:Healed - Epithelialized] [4:Open] [5:Open] Measurements L x W x D 0x0x0 [4:0.7x0.7x0.1] [5:1.7x0.7x0.2] (cm) Area (cm) : [3:0] [4:0.385] [5:0.935] Volume (cm) : [3:0] [4:0.038] [5:0.187] % Reduction in Area: [3:100.00%] [4:-511.10%] [5:-85.90%] % Reduction in Volume: 100.00% [4:-533.30%] [5:-274.00%] Classification: [3:Full Thickness Without Exposed Support Structures] [4:Category/Stage II] [5:Unclassifiable] Exudate Amount: [3:None Present] [4:Small] [5:Small] Exudate Type: [3:N/A] [4:Serous] [5:Serosanguineous] Exudate Color: [3:N/A] [4:amber] [5:red, brown] Wound Margin: [3:Flat and Intact] [4:Flat and Intact] [5:Flat and Intact] Granulation Amount: [3:None Present (0%)] [4:Large (67-100%)] [5:None Present (0%)] Granulation Quality: [3:N/A] [4:Pale] [5:N/A] Necrotic Amount: [3:None Present (0%)] [4:None Present (0%)] [5:Large (67-100%)] Necrotic Tissue: [3:N/A] [4:N/A] [5:Eschar, Adherent Slough] Exposed Structures: [3:Fascia: No Fat Layer (Subcutaneous Tissue) Exposed: Yes Tissue) Exposed: No Tendon: No Muscle: No Joint: No Bone: No] [4:Fat Layer (Subcutaneous Fascia: No Fascia: No Tendon: No Muscle: No Joint: No Bone: No] [5:Fat Layer (Subcutaneous Tissue)  Exposed: No Tendon: No Muscle: No Joint: No Bone: No] Epithelialization: [3:Large  (67-100%)] [4:Large (67-100%)] [5:None] Debridement: [3:N/A] [4:N/A] [5:Debridement - Excisional] Pre-procedure [3:N/A] [4:N/A] [5:16:00] Verification/Time Out Taken: Pain Control: [3:N/A] [4:N/A] [5:Other] Tissue Debrided: [3:N/A] [4:N/A] [5:Necrotic/Eschar, Subcutaneous, Slough] Level: [3:N/A] [4:N/A] [5:Skin/Subcutaneous Tissue] Debridement Area (sq cm):N/A [4:N/A] [5:1.19] Instrument: [3:N/A] [4:N/A] [5:Curette] Bleeding: [3:N/A] [4:N/A] [5:Moderate] Hemostasis Achieved: [3:N/A] [4:N/A] [5:Silver Nitrate] Procedural Pain: [3:N/A] [4:N/A] [5:0] Post Procedural Pain: [3:N/A] [4:N/A] [5:0] Debridement Treatment N/A [4:N/A] [5:Procedure was tolerated] Response: [5:well] Post Debridement [3:N/A] [4:N/A] [5:1.7x0.7x0.2] Measurements L x W x D (cm) Post Debridement [3:N/A] [4:N/A] [5:0.187] Volume: (cm) Procedures Performed: Compression Therapy [4:N/A] [5:Compression Therapy Debridement] Treatment Notes Wound #4 (Left, Medial Foot) 1. Cleanse With Wound Cleanser Soap and water 2. Periwound Care Moisturizing lotion TCA Cream 3. Primary Dressing Applied Foam Notes netting. padded left medical foot and right later leg as directed. right leg 3 layer compression. Wound #5 (Left, Lateral Lower Leg) 1. Cleanse With Wound Cleanser Soap and water 2. Periwound Care Moisturizing lotion TCA Cream 3. Primary Dressing Applied Hydrogel or K-Y Jelly Other primary dressing (specifiy in notes) 4. Secondary Dressing Dry Gauze 6. Support Layer Applied 3 layer compression wrap Notes primary dressing cutimed sorbact swab. netting. Electronic Signature(s) Signed: 04/04/2020 5:13:47 PM By: [name] Signed: 04/04/2020 5:22:40 PM By: Linton Ham MD Entered By: Linton Ham on 04/04/2020 16:52:42 -------------------------------------------------------------------------------- [name] Details Patient Name: Date of Service: [name] 04/04/2020 3:15  PM Medical Record [id number] Patient Account Number: [id number] Date of Birth/Sex: Treating RN: [birth year] (86 y.o. [name] Primary Care Naureen Benton: [name] Other Clinician: Referring Tahlia Deamer: Treating Rigo Letts/Extender:[name] in Treatment: 15 Active Inactive Abuse / Safety / Falls / Self Care Management Nursing Diagnoses: Potential for falls Goals: Patient/caregiver will verbalize understanding of skin care regimen Date Initiated: 12/20/2019 Date Inactivated: 02/14/2020 Target Resolution Date: 02/22/2020 Goal Status: Met Patient/caregiver will verbalize/demonstrate understanding of what to do in case of emergency Date Initiated: 12/20/2019 Target Resolution Date: 04/25/2020 Goal Status: Active Interventions: Assess fall risk on admission and as needed Provide education on fall prevention Notes: Pain, Acute or Chronic Nursing Diagnoses: Pain, acute or chronic: actual or potential Potential alteration in comfort, pain Goals: Patient will verbalize adequate pain control and receive pain control interventions during procedures as needed Date Initiated: 12/20/2019 Date Inactivated: 02/14/2020 Target Resolution Date: 02/22/2020 Goal Status: Met Patient/caregiver will verbalize comfort level met Date Initiated: 12/20/2019 Target Resolution Date: 04/25/2020 Goal Status: Active Interventions: Complete pain assessment as per visit requirements Provide education on pain management Notes: Wound/Skin Impairment Nursing Diagnoses: Knowledge deficit related to ulceration/compromised skin integrity Goals: Patient/caregiver will verbalize understanding of skin care regimen Date Initiated: 12/20/2019 Target Resolution Date: 04/25/2020 Goal Status: Active Interventions: Assess patient/caregiver ability to perform ulcer/skin care regimen upon admission and as needed Provide education on ulcer and skin care Treatment Activities: Skin care  regimen initiated : 12/20/2019 Topical wound management initiated : 12/20/2019 Notes: Electronic Signature(s) Signed: 04/04/2020 5:13:47 PM By: [name] Entered By: [name] on 04/04/2020 15:28:43 -------------------------------------------------------------------------------- Pain Assessment Details Patient Name: Date of Service: [name] 04/04/2020 3:15 PM Medical Record [id number] Patient Account Number: [id number] Date of Birth/Sex: Treating RN: [birth year] Green (87 y.o. [name] Primary Care Terryann Verbeek: [name] Other Clinician: Referring Shanel Prazak: Treating Issiac Jamar/Extender:[name] in Treatment: 15 Active Problems Location of Pain Severity and Description of Pain Patient Has Paino No Site Locations Pain Management and Medication Current Pain Management: Electronic Signature(s) Signed: 04/08/2020 5:28:23 PM By: Tracy Coria RN Entered By: [name] on 04/04/2020 15:41:12 -------------------------------------------------------------------------------- Patient/Caregiver Education Details Patient Name: Date of Service: [name] 4/23/2021andnbsp3:15 PM Medical Record [id number] Patient Account Number: [id number] Date of Birth/Gender: Tracy [birth year], Green (87 y.o. F) Treating RN: [name] Primary Care Physician: [name] Other Clinician: Referring Physician: Treating Physician/Extender:Robson, Secundino Ginger, Junius Creamer in Treatment: 15 Education Assessment Education Provided To: Patient Education Topics Provided Pain: Handouts: A Guide to Pain Control Methods: Explain/Verbal Responses: State content correctly Safety: Handouts: Medication Safety, Personal Safety Methods: Explain/Verbal Responses: State content correctly Wound/Skin Impairment: Handouts: Caring for Your Ulcer Methods: Explain/Verbal Responses: State content correctly Electronic Signature(s) Signed: 04/04/2020  5:13:47 PM By: [name] Entered By: [name] on 04/04/2020 15:29:09 -------------------------------------------------------------------------------- Wound Assessment Details Patient Name: Date of Service: [name] 04/04/2020 3:15 PM Medical Record [id number] Patient Account Number: [id number] Date of Birth/Sex: Treating RN: [birth year] (86 y.o. [name] Primary Care Sanjith Siwek: [name] Other Clinician: Referring Demaris Bousquet: Treating Ura Hausen/Extender:[name] in Treatment: 15 Wound Status Wound Number: 3 Primary Venous Leg Ulcer Wound Location: Right, Lateral Lower Leg Etiology: Wounding Event: Gradually Appeared Wound Healed - Epithelialized Date Acquired: 11/19/2019 Status: Green Of Treatment:15 ComorbidCataracts, Anemia, Arrhythmia, Congestive Clustered Wound: No History: Heart Failure, Hypertension, Peripheral Venous Disease, Received Radiation Photos Photo Uploaded By: Mikeal Hawthorne on 04/08/2020 13:41:06 Wound Measurements Length: (cm) 0 % Reduct Width: (  cm) 0 % Reduct Depth: (cm) 0 Epitheli Area: (cm) 0 Tunneli Volume: (cm) 0 Undermi Wound Description Full Thickness Without Exposed Support Foul Odo Classification: Classification: Structures Sloug Wound Flat and Intact Margin: Exudate None Present Amount: Wound Bed Granulation Amount: None Present (0%) Necrotic Amount: None Present (0%) Fasci Fat L Tendo Muscl Joint Bone r After Cleansing: No h/Fibrino No Exposed Structure a Exposed: No ayer (Subcutaneous Tissue) Exposed: No n Exposed: No e Exposed: No Exposed: No Exposed: No ion in Area: 100% ion in Volume: 100% alization: Large (67-100%) ng: No ning: No Electronic Signature(s) Signed: 04/04/2020 5:13:47 PM By: [name] Entered By: [name] on 04/04/2020 15:59:58 -------------------------------------------------------------------------------- Wound  Assessment Details Patient Name: Date of Service: [name] 04/04/2020 3:15 PM Medical Record [id number] Patient Account Number: [id number] Date of Birth/Sex: Treating RN: [birth year] Green (86 y.o. [name] Primary Care Rhylie Stehr: [name] Other Clinician: Referring Zainah Steven: Treating Antony Sian/Extender:[name] in Treatment: 15 Wound Status Wound Number: 4 Primary Pressure Ulcer Wound Location: Left, Medial Foot Etiology: Wounding Event: Shear/Friction Wound Open Date Acquired: 01/19/2020 Status: Green Of Treatment:10 ComorbidCataracts, Anemia, Arrhythmia, Congestive Clustered Wound: No History: Heart Failure, Hypertension, Peripheral Venous Disease, Received Radiation Photos Photo Uploaded By: Mikeal Hawthorne on 04/08/2020 13:41:06 Wound Measurements Length: (cm) 0.7 % Red Width: (cm) 0.7 % Red Depth: (cm) 0.1 Epith Area: (cm) 0.385 Tunn Volume: (cm) 0.038 Unde Wound Description Classification: Category/Stage II Wound Margin: Flat and Intact Exudate Amount: Small Exudate Type: Serous Exudate Color: amber Wound Bed Granulation Amount: Large (67-100%) Granulation Quality: Pale Necrotic Amount: None Present (0%) Foul Odor After Cleansing: No Slough/Fibrino No Exposed Structure Fascia Exposed: No Fat Layer (Subcutaneous Tissue) Exposed: Yes Tendon Exposed: No Muscle Exposed: No Joint Exposed: No Bone Exposed: No uction in Area: -511.1% uction in Volume: -533.3% elialization: Large (67-100%) eling: No rmining: No Treatment Notes Wound #4 (Left, Medial Foot) 1. Cleanse With Wound Cleanser Soap and water 2. Periwound Care Moisturizing lotion TCA Cream 3. Primary Dressing Applied Foam Notes netting. padded left medical foot and right later leg as directed. right leg 3 layer compression. Electronic Signature(s) Signed: 04/08/2020 5:28:23 PM By: Tracy Coria RN Entered By: [name] on 04/04/2020  15:42:34 -------------------------------------------------------------------------------- Wound Assessment Details Patient Name: Date of Service: [name], [name] 04/04/2020 3:15 PM Medical Record [id number] Patient Account Number: [id number] Date of Birth/Sex: Treating RN: [birth year] (86 y.o. [name] Primary Care Malikai Gut: [name] Other Clinician: Referring Magdalina Whitehead: Treating Trevante Tennell/Extender:[name] in Treatment: 15 Wound Status Wound Number: 5 Primary Venous Leg Ulcer Wound Location: Left, Lateral Lower Leg Etiology: Wounding Event: Gradually Appeared Wound Open Date Acquired: 03/21/2020 Status: Green Of Treatment:2 ComorbidCataracts, Anemia, Arrhythmia, Congestive Clustered Wound: No History: Heart Failure, Hypertension, Peripheral Venous Disease, Received Radiation Photos Photo Uploaded By: Mikeal Hawthorne on 04/08/2020 13:41:28 Wound Measurements Length: (cm) 1.7 Width: (cm) 0.7 Depth: (cm) 0.2 Area: (cm) 0.935 Volume: (cm) 0.187 Wound Description Classification: Unclassifiable Wound Margin: Flat and Intact Exudate Amount: Small Exudate Type: Serosanguineous Exudate Color: red, brown Wound Bed Granulation Amount: None Present (0%) Necrotic Amount: Large (67-100%) Necrotic Quality: Eschar, Adherent Slough Foul Odor After Cleansing: No Slough/Fibrino Yes Exposed Structure Fascia Exposed: Fat Layer (Subcutaneous Tissue) Exposed: Tendon Exposed: Muscle Exposed: Joint Exposed: Bone Exposed: % Reduction in Area: -85.9% % Reduction in Volume: -274% Epithelialization: None Tunneling: No Undermining: No No No No No No No Treatment Notes Wound #5 (Left, Lateral Lower Leg) 1. Cleanse With Wound  Cleanser Soap and water 2. Periwound Care Moisturizing lotion TCA Cream 3. Primary Dressing Applied 3. Primary Dressing Applied Hydrogel or K-Y Jelly Other primary dressing (specifiy in notes) 4. Secondary  Dressing Dry Gauze 6. Support Layer Applied 3 layer compression wrap Notes primary dressing cutimed sorbact swab. netting. Electronic Signature(s) Signed: 04/08/2020 5:28:23 PM By: Tracy Coria RN Entered By: [name] on 04/04/2020 15:42:57 -------------------------------------------------------------------------------- [name] Details Patient Name: Date of Service: [name] 04/04/2020 3:15 PM Medical Record [id number] Patient Account Number: [id number] Date of Birth/Sex: Treating RN: [birth year] Green (86 y.o. [name] Primary Care Licia Harl: [name] Other Clinician: Referring Lenice Koper: Treating Crystalynn Mcinerney/Extender:[name] in Treatment: 15 Vital Signs Time Taken: 15:40 Temperature (F): 97.4 Height (in): 69 Pulse (bpm): 68 Weight (lbs): 163 Respiratory Rate (breaths/min): 18 Body Mass Index (BMI): 24.1 Blood Pressure (mmHg): 111/56 Reference Range: 80 - 120 mg / dl Electronic Signature(s) Signed: 04/08/2020 5:28:23 PM By: Tracy Coria RN Entered By: [name] on 04/04/2020 15:41:05

## 2020-04-10 ENCOUNTER — Encounter (HOSPITAL_BASED_OUTPATIENT_CLINIC_OR_DEPARTMENT_OTHER): Payer: Medicare Other | Admitting: Internal Medicine

## 2020-04-10 DIAGNOSIS — L97522 Non-pressure chronic ulcer of other part of left foot with fat layer exposed: Secondary | ICD-10-CM | POA: Diagnosis not present

## 2020-04-18 ENCOUNTER — Other Ambulatory Visit (HOSPITAL_COMMUNITY)
Admission: RE | Admit: 2020-04-18 | Discharge: 2020-04-18 | Disposition: A | Discharge status: Home / Self Care | Payer: Medicare Other | Source: Other Acute Inpatient Hospital | Attending: Internal Medicine | Admitting: Internal Medicine

## 2020-04-18 ENCOUNTER — Encounter (HOSPITAL_BASED_OUTPATIENT_CLINIC_OR_DEPARTMENT_OTHER): Payer: Medicare Other | Attending: Internal Medicine | Admitting: Internal Medicine

## 2020-04-18 DIAGNOSIS — Z853 Personal history of malignant neoplasm of breast: Secondary | ICD-10-CM | POA: Insufficient documentation

## 2020-04-18 DIAGNOSIS — I890 Lymphedema, not elsewhere classified: Secondary | ICD-10-CM | POA: Diagnosis not present

## 2020-04-18 DIAGNOSIS — Z950 Presence of cardiac pacemaker: Secondary | ICD-10-CM | POA: Diagnosis not present

## 2020-04-18 DIAGNOSIS — L089 Local infection of the skin and subcutaneous tissue, unspecified: Secondary | ICD-10-CM | POA: Diagnosis present

## 2020-04-18 DIAGNOSIS — D649 Anemia, unspecified: Secondary | ICD-10-CM | POA: Insufficient documentation

## 2020-04-18 DIAGNOSIS — I110 Hypertensive heart disease with heart failure: Secondary | ICD-10-CM | POA: Diagnosis not present

## 2020-04-18 DIAGNOSIS — I509 Heart failure, unspecified: Secondary | ICD-10-CM | POA: Diagnosis not present

## 2020-04-18 DIAGNOSIS — J849 Interstitial pulmonary disease, unspecified: Secondary | ICD-10-CM | POA: Diagnosis not present

## 2020-04-18 DIAGNOSIS — L97828 Non-pressure chronic ulcer of other part of left lower leg with other specified severity: Secondary | ICD-10-CM | POA: Diagnosis not present

## 2020-04-18 DIAGNOSIS — I482 Chronic atrial fibrillation, unspecified: Secondary | ICD-10-CM | POA: Insufficient documentation

## 2020-04-18 DIAGNOSIS — L97522 Non-pressure chronic ulcer of other part of left foot with fat layer exposed: Secondary | ICD-10-CM | POA: Insufficient documentation

## 2020-04-18 DIAGNOSIS — I872 Venous insufficiency (chronic) (peripheral): Secondary | ICD-10-CM | POA: Diagnosis not present

## 2020-04-18 NOTE — Progress Notes (Signed)
Tracy, Green ([id number]) Visit Report for 04/18/2020 Debridement Details Patient Name: Date of Service: [name], [name] 04/18/2020 9:15 A M Medical Record Number: [id number] Patient Account Number: [id number] Date of Birth/Sex: Treating RN: 14-Sep-1933 (86 y.o. [name] Primary Care Provider: Lajean Manes T Other Clinician: Referring Provider: Treating Provider/Extender: Evelena Peat in Treatment: 17 Debridement Performed for Assessment: Wound #5 Left,Lateral Lower Leg Performed By: Physician Ricard Dillon., MD Debridement Type: Debridement Severity of Tissue Pre Debridement: Fat layer exposed Level of Consciousness (Pre-procedure): Awake and Alert Pre-procedure Verification/Time Out Yes - 10:45 Taken: Start Time: 10:45 Pain Control: Lidocaine 4% T opical Solution T Area Debrided (L x W): otal 2.2 (cm) x 1.7 (cm) = 3.74 (cm) Tissue and other material debrided: Viable, Non-Viable, Slough, Subcutaneous, Slough Level: Skin/Subcutaneous Tissue Debridement Description: Excisional Instrument: Curette Bleeding: Minimum Hemostasis Achieved: Pressure End Time: 10:48 Procedural Pain: 6 Post Procedural Pain: 3 Response to Treatment: Procedure was tolerated well Level of Consciousness (Post- Awake and Alert procedure): Post Debridement Measurements of Total Wound Length: (cm) 2.2 Width: (cm) 1.7 Depth: (cm) 0.2 Volume: (cm) 0.587 Character of Wound/Ulcer Post Debridement: Improved Severity of Tissue Post Debridement: Fat layer exposed Post Procedure Diagnosis Same as Pre-procedure Electronic Signature(s) Signed: 04/18/2020 4:59:38 PM By: Linton Ham MD Signed: 04/18/2020 5:35:25 PM By: [name] RN, BSN Entered By: Linton Ham on 04/18/2020 11:26:55 -------------------------------------------------------------------------------- HPI Details Patient Name: Date of Service: [name]. 04/18/2020 9:15 A M Medical Record  Number: [id number] Patient Account Number: [id number] Date of Birth/Sex: Treating RN: [birth year] (86 y.o. [name] Primary Care Provider: Lajean Manes T Other Clinician: Referring Provider: Treating Provider/Extender: Evelena Peat in Treatment: 17 History of Present Illness HPI Description: ADMISSION 12/20/2019 This is an 86 year old woman referred by her primary physician Dr. Felipa Eth for review of a wound on her right lateral lower leg. She was actually in this clinic on 2 separate occasions in 2010 and 2012 cared for by Dr. Sherilyn Cooter. At that point in time she had wounds on her right leg as well. She tells [name] to 1 month ago she noticed a scab building up on her right lateral lower leg this opened into a wound. There was no overt cause of this no trauma, no infection that she is aware of. She has a history of chronic venous insufficiency and wears compression stockings fairly religiously indeed she is done well over the last 8 years since she was last in this clinic. She has been applying Vaseline on this and a covering phone. This is not progressing towards healing. Past medical history; interstitial lung disease, chronic atrial fibrillation status post pacemaker, osteoarthritis of the left knee, left breast CA, chronic repeat venous insufficiency. She takes Eliquis for her atrial fibrillation stroke prophylaxis. ABI in our clinic was 0.99 on the right 1/15; superficial wound on the right lateral calf in the setting of severe acute skin changes from chronic venous insufficiency and lymphedema. We used Iodoflex last week she complained of a lot of pain 1/22; this is a small but difficult wound on the right lateral calf in the setting of severe chronic venous insufficiency and secondary lymphedema. She continues to state the wounds things hurts when she is up on it but seems to be relieved by putting her leg up. I changed her to [facility] last  week because the Iodoflex seem to be causing stinging. 1/29. This wound appears to be contracting somewhat. Changes  of chronic venous insufficiency with secondary lymphedema 2/12; not as good as surface today and slightly bigger. I had to change her from Iodoflex to [facility] because of the stinging pain although she does not think it was any better on the [facility]. She comes into clinic today with a area on the medial left great toe. She said she noticed blood on her sock last Saturday. She had some form of bunion surgery by Dr. Ila Green her podiatrist sometime in 2019 she said he "shave the area". I could not find his operative report although I did see reference to the bunion in the area. 2/19; somewhat improved wound on the right lateral lower leg however the wound she came in on the bunion of her left great toe is actually I think larger still somewhat inflamed. We have been using Iodoflex 2/26; not much improvement in either wound area which is the original venous wound on the right lateral lower leg and in the area on the bunion of her left great toe. This still looks somewhat inflamed and tender. We have been using Iodoflex with open much improvement. 3/4; in general both her wounds look better this includes the original venous insufficiency wound on the right lateral lower leg and the area on her tip of her bunion on the left great toe medial aspect of the MTP. Change to Hydrofera Blue last week 3/12; the patient still has a small geographic shaped wound on the right lateral lower leg. We have been using Hydrofera Blue but I changed her to endoform today. She also has the area in the tip of the bunion of the left great toe. We will try Hydrofera Blue here as well 3/19; the small geographic wound on the right lateral lower leg has not filled in. There is some surrounding induration we have noticed this previously. This may be venous inflammation but I wonder about biopsying  this if this is not closing up. The area over the left medial first MTP [bunion deformity] requires debridement. This is not closing in. I am not convinced she is offloading adequately 3/26; right lateral lower leg in the setting of severe venous insufficiency and also an area over the first MTP bunion deformity. No debridement is required. We have been using endoform 4/9; right lateral lower leg wound in the setting of severe venous insufficiency and also a refractory area over the first MTP bunion deformity. The area on the right lateral lower leg is just about closed there is still a minor open area here. She comes in today with a mirror image area on the left lateral lower leg. She says this started as a scab 2 weeks ago and is gradually morphed into an open wound very similar to what she has on the right leg. She has severe bilateral venous insufficiency with venous hypertension obvious from clinical exam. 4/16; her original wound the right lateral leg wound in the setting of chronic venous hypertension is a small open area but very small. We have been using endoform. Her wound over the left first MTP bunion deformity also appears to be small and closing in. Unfortunately she has a large relative area on the left lateral calf which was new last week. Very adherent debris over this we have been using Iodoflex however that may be contributing to the debris on the wound surface 4/23; the original wound is closed and the area on the left first metatarsal bunion deformity is also almost closed the new area  from last week required debridement. She went for her reflux studies that did not take her lower extremity wraps off. This is not helpful. She did have significant reflux in the right common femoral vein. I am not going to press this issue further. She clinically has severe venous hypertension 4/29; the original wound is closed on the right lateral lower calf. The area on the left first  metatarsal/bunion deformity has a small slitlike opening that is still not closed. The real problem here is now wound on the left lateral calf which is necrotic and deep. We used Iodoflex on this wound last time. Silver alginate on the left first toe The patient has Farrow wraps. We should be able to transition the right leg into compression stockings and were doing this today. 5/7; the original wound remains closed on the right lateral calf. The left first metatarsal head still is open. Deterioration in the wound which is the new wound from several weeks ago on the left lateral calf. The patient is not aware how this could have happened. We have been using Sorbact starting last week under 3 layer compression Electronic Signature(s) Signed: 04/18/2020 4:59:38 PM By: Linton Ham MD Entered By: Linton Ham on 04/18/2020 11:27:54 -------------------------------------------------------------------------------- Physical Exam Details Patient Name: Date of Service: [name]. 04/18/2020 9:15 A M Medical Record Number: [id number] Patient Account Number: [id number] Date of Birth/Sex: Treating RN: Dec 26, 1932 (86 y.o. [name] Primary Care Provider: Lajean Manes T Other Clinician: Referring Provider: Treating Provider/Extender: [name] in Treatment: 72 Constitutional Patient is hypertensive.. Pulse regular and within target range for patient.Marland Kitchen Respirations regular, non-labored and within target range.. Temperature is normal and within the target range for the patient.Marland Kitchen Appears in no distress. Notes Wound exam; the areas we have been following on the right lateral leg is closed she has a juxta lite stocking here. This was her original wound in the setting of chronic venous insufficiency The area on the bunion deformity is still open this may need to be debrided next week I did not do this today The wound on the left lateral calf is deteriorated.  With increasing depth completely necrotic surface necessitating a difficult debridement with a #5 curette removing necrotic subcutaneous tissue. There is raised somewhat hyper granulated tissue around the wound edges. I cultured this after debrided Electronic Signature(s) Signed: 04/18/2020 4:59:38 PM By: Linton Ham MD Entered By: Linton Ham on 04/18/2020 11:29:05 -------------------------------------------------------------------------------- Physician Orders Details Patient Name: Date of Service: [name]. 04/18/2020 9:15 A M Medical Record Number: [id number] Patient Account Number: [id number] Date of Birth/Sex: Treating RN: 1933/03/07 (86 y.o. [name] Primary Care Provider: Lajean Manes T Other Clinician: Referring Provider: Treating Provider/Extender: Evelena Peat in Treatment: 17 Verbal / Phone Orders: No Diagnosis Coding ICD-10 Coding Code Description I87.321 Chronic venous hypertension (idiopathic) with inflammation of right lower extremity L97.828 Non-pressure chronic ulcer of other part of left lower leg with other specified severity I87.322 Chronic venous hypertension (idiopathic) with inflammation of left lower extremity L97.522 Non-pressure chronic ulcer of other part of left foot with fat layer exposed Follow-up Appointments ppointment in 1 week. - Friday Return A Dressing Change Frequency Wound #4 Left,Medial Foot Change Dressing every other day. - only to left medial foot wound Wound #5 Left,Lateral Lower Leg Do not change entire dressing for one week. Skin Barriers/Peri-Wound Care Moisturizing lotion - patient to lotion right leg every night. TCA Cream or Ointment -  mixed with lotion to left leg Wound Cleansing May shower with protection. - cast protector Primary Wound Dressing Wound #4 Left,Medial Foot Calcium A lginate with Silver Other: - foam donut to left medial foot. Wound #5 Left,Lateral Lower  Leg Calcium Alginate with Silver Secondary Dressing Wound #5 Left,Lateral Lower Leg Dry Gauze ABD pad Edema Control 3 Layer Compression System - Left Lower Extremity - use kerlix instead of cotton Avoid standing for long periods of time Elevate legs to the level of the heart or above for 30 minutes daily and/or when sitting, a frequency of: - throughout the day. Exercise regularly Support Garment 20-30 mm/Hg pressure to: - patient to apply farrow wrap 4000 to right leg. Apply in the morning and remove at night. Off-Loading Open toe surgical shoe to: - left Laboratory naerobe culture (MICRO) - left lower leg Bacteria identified in Unspecified specimen by A LOINC Code: [id number] Convenience Name: Anerobic culture Electronic Signature(s) Signed: 04/18/2020 4:59:38 PM By: Linton Ham MD Signed: 04/18/2020 5:35:25 PM By: [name] RN, BSN Entered By: [name] on 04/18/2020 10:52:06 -------------------------------------------------------------------------------- Problem List Details Patient Name: Date of Service: [name]. 04/18/2020 9:15 A M Medical Record Number: [id number] Patient Account Number: [id number] Date of Birth/Sex: Treating RN: 24-May-1933 (86 y.o. [name] Primary Care Provider: Lajean Manes T Other Clinician: Referring Provider: Treating Provider/Extender: Evelena Peat in Treatment: 17 Active Problems ICD-10 Encounter Code Description Active Date MDM Diagnosis I87.321 Chronic venous hypertension (idiopathic) with inflammation of right lower 12/20/2019 No Yes extremity L97.828 Non-pressure chronic ulcer of other part of left lower leg with other specified 03/21/2020 No Yes severity I87.322 Chronic venous hypertension (idiopathic) with inflammation of left lower 02/14/2020 No Yes extremity L97.522 Non-pressure chronic ulcer of other part of left foot with fat layer exposed 01/25/2020 No Yes Inactive  Problems ICD-10 Code Description Active Date Inactive Date L97.812 Non-pressure chronic ulcer of other part of right lower leg with fat layer exposed 12/20/2019 12/20/2019 Resolved Problems Electronic Signature(s) Signed: 04/18/2020 4:59:38 PM By: Linton Ham MD Entered By: Linton Ham on 04/18/2020 11:26:38 -------------------------------------------------------------------------------- Progress Note Details Patient Name: Date of Service: [name]. 04/18/2020 9:15 A M Medical Record Number: [id number] Patient Account Number: [id number] Date of Birth/Sex: Treating RN: 03/04/1933 (86 y.o. [name] Primary Care Provider: Lajean Manes T Other Clinician: Referring Provider: Treating Provider/Extender: Evelena Peat in Treatment: 17 Subjective History of Present Illness (HPI) ADMISSION 12/20/2019 This is an 86 year old woman referred by her primary physician Dr. Felipa Eth for review of a wound on her right lateral lower leg. She was actually in this clinic on 2 separate occasions in 2010 and 2012 cared for by Dr. Sherilyn Cooter. At that point in time she had wounds on her right leg as well. She tells [name] to 1 month ago she noticed a scab building up on her right lateral lower leg this opened into a wound. There was no overt cause of this no trauma, no infection that she is aware of. She has a history of chronic venous insufficiency and wears compression stockings fairly religiously indeed she is done well over the last 8 years since she was last in this clinic. She has been applying Vaseline on this and a covering phone. This is not progressing towards healing. Past medical history; interstitial lung disease, chronic atrial fibrillation status post pacemaker, osteoarthritis of the left knee, left breast CA, chronic repeat venous insufficiency. She takes Eliquis for her atrial  fibrillation stroke prophylaxis. ABI in our clinic was 0.99 on the right 1/15;  superficial wound on the right lateral calf in the setting of severe acute skin changes from chronic venous insufficiency and lymphedema. We used Iodoflex last week she complained of a lot of pain 1/22; this is a small but difficult wound on the right lateral calf in the setting of severe chronic venous insufficiency and secondary lymphedema. She continues to state the wounds things hurts when she is up on it but seems to be relieved by putting her leg up. I changed her to [facility] last week because the Iodoflex seem to be causing stinging. 1/29. This wound appears to be contracting somewhat. Changes of chronic venous insufficiency with secondary lymphedema 2/12; not as good as surface today and slightly bigger. I had to change her from Iodoflex to [facility] because of the stinging pain although she does not think it was any better on the [facility]. ooShe comes into clinic today with a area on the medial left great toe. She said she noticed blood on her sock last Saturday. She had some form of bunion surgery by Dr. Ila Green her podiatrist sometime in 2019 she said he "shave the area". I could not find his operative report although I did see reference to the bunion in the area. 2/19; somewhat improved wound on the right lateral lower leg however the wound she came in on the bunion of her left great toe is actually I think larger still somewhat inflamed. We have been using Iodoflex 2/26; not much improvement in either wound area which is the original venous wound on the right lateral lower leg and in the area on the bunion of her left great toe. This still looks somewhat inflamed and tender. We have been using Iodoflex with open much improvement. 3/4; in general both her wounds look better this includes the original venous insufficiency wound on the right lateral lower leg and the area on her tip of her bunion on the left great toe medial aspect of the MTP. Change to Hydrofera Blue  last week 3/12; the patient still has a small geographic shaped wound on the right lateral lower leg. We have been using Hydrofera Blue but I changed her to endoform today. She also has the area in the tip of the bunion of the left great toe. We will try Hydrofera Blue here as well 3/19; the small geographic wound on the right lateral lower leg has not filled in. There is some surrounding induration we have noticed this previously. This may be venous inflammation but I wonder about biopsying this if this is not closing up. The area over the left medial first MTP [bunion deformity] requires debridement. This is not closing in. I am not convinced she is offloading adequately 3/26; right lateral lower leg in the setting of severe venous insufficiency and also an area over the first MTP bunion deformity. No debridement is required. We have been using endoform 4/9; right lateral lower leg wound in the setting of severe venous insufficiency and also a refractory area over the first MTP bunion deformity. The area on the right lateral lower leg is just about closed there is still a minor open area here. She comes in today with a mirror image area on the left lateral lower leg. She says this started as a scab 2 weeks ago and is gradually morphed into an open wound very similar to what she has on the right leg. She  has severe bilateral venous insufficiency with venous hypertension obvious from clinical exam. 4/16; her original wound the right lateral leg wound in the setting of chronic venous hypertension is a small open area but very small. We have been using endoform. Her wound over the left first MTP bunion deformity also appears to be small and closing in. Unfortunately she has a large relative area on the left lateral calf which was new last week. Very adherent debris over this we have been using Iodoflex however that may be contributing to the debris on the wound surface 4/23; the original wound is  closed and the area on the left first metatarsal bunion deformity is also almost closed the new area from last week required debridement. She went for her reflux studies that did not take her lower extremity wraps off. This is not helpful. She did have significant reflux in the right common femoral vein. I am not going to press this issue further. She clinically has severe venous hypertension 4/29; the original wound is closed on the right lateral lower calf. The area on the left first metatarsal/bunion deformity has a small slitlike opening that is still not closed. The real problem here is now wound on the left lateral calf which is necrotic and deep. We used Iodoflex on this wound last time. Silver alginate on the left first toe The patient has Farrow wraps. We should be able to transition the right leg into compression stockings and were doing this today. 5/7; the original wound remains closed on the right lateral calf. The left first metatarsal head still is open. Deterioration in the wound which is the new wound from several weeks ago on the left lateral calf. The patient is not aware how this could have happened. We have been using Sorbact starting last week under 3 layer compression Objective Constitutional Patient is hypertensive.. Pulse regular and within target range for patient.Marland Kitchen Respirations regular, non-labored and within target range.. Temperature is normal and within the target range for the patient.Marland Kitchen Appears in no distress. Vitals Time Taken: 9:29 AM, Height: 69 in, Weight: 163 lbs, BMI: 24.1, Temperature: 98.2 F, Pulse: 64 bpm, Respiratory Rate: 18 breaths/min, Blood Pressure: 145/66 mmHg. General Notes: Wound exam; the areas we have been following on the right lateral leg is closed she has a juxta lite stocking here. This was her original wound in the setting of chronic venous insufficiency ooThe area on the bunion deformity is still open this may need to be debrided next week  I did not do this today ooThe wound on the left lateral calf is deteriorated. With increasing depth completely necrotic surface necessitating a difficult debridement with a #5 curette removing necrotic subcutaneous tissue. There is raised somewhat hyper granulated tissue around the wound edges. I cultured this after debrided Integumentary (Hair, Skin) Wound #4 status is Open. Original cause of wound was Shear/Friction. The wound is located on the Left,Medial Foot. The wound measures 0.5cm length x 0.9cm width x 0.1cm depth; 0.353cm^2 area and 0.035cm^3 volume. There is Fat Layer (Subcutaneous Tissue) Exposed exposed. There is no tunneling or undermining noted. There is a small amount of serous drainage noted. The wound margin is flat and intact. There is small (1-33%) pink, pale granulation within the wound bed. There is no necrotic tissue within the wound bed. Wound #5 status is Open. Original cause of wound was Gradually Appeared. The wound is located on the Left,Lateral Lower Leg. The wound measures 2.2cm length x 1.7cm width x 0.2cm depth;  2.937cm^2 area and 0.587cm^3 volume. There is no tunneling or undermining noted. There is a medium amount of serosanguineous drainage noted. The wound margin is flat and intact. There is no granulation within the wound bed. There is a large (67-100%) amount of necrotic tissue within the wound bed including Adherent Slough. General Notes: redness to periwound noted. 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 There was a Excisional Skin/Subcutaneous Tissue Debridement with a total area of 3.74 sq cm performed by Ricard Dillon., MD. With the following instrument(s): Curette to remove Viable and Non-Viable tissue/material. Material removed includes Subcutaneous Tissue and Slough and after achieving pain control using Lidocaine 4% T opical Solution. No specimens were taken. A time out was conducted at 10:45, prior to the start of the procedure. A Minimum amount of bleeding was controlled with Pressure. The procedure was tolerated well with a pain level of 6 throughout and a pain level of 3 following the procedure. Post Debridement Measurements: 2.2cm length x 1.7cm width x 0.2cm depth; 0.587cm^3 volume. Character of Wound/Ulcer Post Debridement is improved. Severity of Tissue Post Debridement is: Fat layer exposed. Post procedure Diagnosis Wound #5: Same as Pre-Procedure Pre-procedure diagnosis of Wound #5 is a Venous Leg Ulcer located on the Left,Lateral Lower Leg . There was a Three Layer Compression Therapy Procedure by Deon Pilling, RN. Post procedure Diagnosis Wound #5: Same as Pre-Procedure Plan Follow-up Appointments: Return Appointment in 1 week. - Friday Dressing Change Frequency: Wound #4 Left,Medial Foot: Change Dressing every other day. - only to left medial foot wound Wound #5 Left,Lateral Lower Leg: Do not change entire dressing for one week. Skin Barriers/Peri-Wound Care: Moisturizing lotion - patient to lotion right leg every night. TCA Cream or Ointment - mixed with lotion to left leg Wound Cleansing: May shower with protection. - cast protector Primary Wound Dressing: Wound #4 Left,Medial Foot: Calcium Alginate with Silver Other: - foam donut to left medial foot. Wound #5 Left,Lateral Lower Leg: Calcium Alginate with Silver Secondary Dressing: Wound #5 Left,Lateral Lower Leg: Dry Gauze ABD pad Edema Control: 3 Layer  Compression System - Left Lower Extremity - use kerlix instead of cotton Avoid standing for long periods of time Elevate legs to the level of the heart or above for 30 minutes daily and/or when sitting, a frequency of: - throughout the day. Exercise regularly Support Garment 20-30 mm/Hg pressure to: - patient to apply farrow wrap 4000 to right leg. Apply in the morning and remove at night. Off-Loading: Open toe surgical shoe to: - left Laboratory ordered were: Anerobic culture - left lower leg 1. I change the dressing to silver alginate out of concern for possible underlying infection 2. Swab culture for CandS post debridement but no empiric antibiotics 3. This is beginning to look like an unusual wounded area. I think she has chronic venous insufficiency but this does not look like a venous insufficiency wound. I cannot rule out having to biopsy this 4. We put silver alginate back on the bunion this may need to be redebrided as well and I cannot rule out sending  this back to podiatry Electronic Signature(s) Signed: 04/18/2020 4:59:38 PM By: Linton Ham MD Entered By: Linton Ham on 04/18/2020 11:30:27 -------------------------------------------------------------------------------- SuperBill Details Patient Name: Date of Service: [name] 04/18/2020 Medical Record Number: [id number] Patient Account Number: [id number] Date of Birth/Sex: Treating RN: Dec 27, 1932 (86 y.o. [name] Primary Care Provider: Lajean Manes T Other Clinician: Referring Provider: Treating Provider/Extender: Evelena Peat in Treatment: 17 Diagnosis Coding ICD-10 Codes Code Description [phone] Chronic venous hypertension (idiopathic) with inflammation of right lower extremity L97.828 Non-pressure chronic ulcer of other part of left lower leg with other specified severity I87.322 Chronic venous hypertension (idiopathic) with inflammation of left lower  extremity L97.522 Non-pressure chronic ulcer of other part of left foot with fat layer exposed Facility Procedures The patient participates with Medicare or their insurance follows the Medicare Facility Guidelines: CPT4 Code Description Modifier Quantity [id number] - DEB SUBQ TISSUE 20 SQ CM/< 1 ICD-10 Diagnosis Description L97.828 Non-pressure chronic ulcer of  other part of left lower leg with other specified severity Physician Procedures : CPT4 Code Description Modifier [id number] 11042 - WC PHYS SUBQ TISS 20 SQ CM ICD-10 Diagnosis Description L97.828 Non-pressure chronic ulcer of other part of left lower leg with other specified severity Quantity: 1 Electronic Signature(s) Signed: 04/18/2020 4:59:38 PM By: Linton Ham MD Entered By: Linton Ham on 04/18/2020 11:30:38

## 2020-04-21 LAB — AEROBIC CULTURE W GRAM STAIN (SUPERFICIAL SPECIMEN): Gram Stain: NONE SEEN

## 2020-04-22 NOTE — Progress Notes (Signed)
TEDDI, BADALAMENTI ([id number]) Visit Report for 04/18/2020 Arrival Information Details Patient Name: Date of Service: [place], [name] 04/18/2020 9:15 A M Medical Record Number: [id number] Patient Account Number: [id number] Date of Birth/Sex: Treating RN: Feb 24, 1933 (86 y.o. [name] Primary Care Lalena Salas: Lajean Manes T Other Clinician: Referring Shelaine Frie: Treating Kamauri Kathol/Extender: Evelena Peat in Treatment: 49 Visit Information History Since Last Visit Added or deleted any medications: No Patient Arrived: Ambulatory Any new allergies or adverse reactions: No Arrival Time: 09:19 Had a fall or experienced change in No Accompanied By: self activities of daily living that may affect Transfer Assistance: None risk of falls: Patient Identification Verified: Yes Signs or symptoms of abuse/neglect since last visito No Secondary Verification Process Completed: Yes Hospitalized since last visit: No Patient Requires Transmission-Based Precautions: No Implantable device outside of the clinic excluding No Patient Has Alerts: Yes cellular tissue based products placed in the center Patient Alerts: Patient on Blood Thinner since last visit: Right ABI:0.99 Has Dressing in Place as Prescribed: Yes Pain Present Now: No Electronic Signature(s) Signed: 04/22/2020 8:52:48 AM By: Sandre Kitty Entered By: Sandre Kitty on 04/18/2020 09:19:34 -------------------------------------------------------------------------------- Compression Therapy Details Patient Name: Date of Service: [name]. 04/18/2020 9:15 A M Medical Record Number: [id number] Patient Account Number: [id number] Date of Birth/Sex: Treating RN: May 18, 1933 (86 y.o. [name] Primary Care Cedric Mcclaine: Lajean Manes T Other Clinician: Referring Dredyn Gubbels: Treating Carlie Solorzano/Extender: Evelena Peat in Treatment: 17 Compression Therapy Performed for Wound  Assessment: Wound #5 Left,Lateral Lower Leg Performed By: Clinician Deon Pilling, RN Compression Type: Three Layer Post Procedure Diagnosis Same as Pre-procedure Electronic Signature(s) Signed: 04/18/2020 5:35:25 PM By: [name] RN, BSN Entered By: [name] on 04/18/2020 10:43:07 -------------------------------------------------------------------------------- Encounter Discharge Information Details Patient Name: Date of Service: [name]. 04/18/2020 9:15 A M Medical Record Number: [id number] Patient Account Number: [id number] Date of Birth/Sex: Treating RN: [birth year] (86 y.o. [name] Primary Care Tavon Magnussen: Lajean Manes T Other Clinician: Referring Dimond Crotty: Treating Myrikal Messmer/Extender: Evelena Peat in Treatment: 17 Encounter Discharge Information Items Post Procedure Vitals Discharge Condition: Stable Temperature (F): 98.2 Ambulatory Status: Ambulatory Pulse (bpm): 64 Discharge Destination: Home Respiratory Rate (breaths/min): 18 Transportation: Private Auto Blood Pressure (mmHg): 145/66 Accompanied By: self Schedule Follow-up Appointment: Yes Clinical Summary of Care: Electronic Signature(s) Signed: 04/18/2020 4:59:21 PM By: Deon Pilling Entered By: Deon Pilling on 04/18/2020 11:10:46 -------------------------------------------------------------------------------- Lower Extremity Assessment Details Patient Name: Date of Service: [name], [name] 04/18/2020 9:15 A M Medical Record Number: [id number] Patient Account Number: [id number] Date of Birth/Sex: Treating RN: 03-29-1933 (86 y.o. [name] Primary Care Zenon Leaf: Lajean Manes T Other Clinician: Referring Laurel Harnden: Treating Chelisa Hennen/Extender: [name] in Treatment: 17 Edema Assessment Assessed: Shirlyn Goltz: Yes] [Right: No] Edema: [Left: No] [Right: No] Calf Left: Right: Point of Measurement: 42 cm From Medial Instep 31  cm cm Ankle Left: Right: Point of Measurement: 13 cm From Medial Instep 20.5 cm cm Vascular Assessment Pulses: Dorsalis Pedis Palpable: [Left:Yes] Electronic Signature(s) Signed: 04/18/2020 4:59:21 PM By: Deon Pilling Signed: 04/18/2020 5:35:25 PM By: [name] RN, BSN Entered By: Deon Pilling on 04/18/2020 09:48:03 -------------------------------------------------------------------------------- Multi Wound Chart Details Patient Name: Date of Service: [name]. 04/18/2020 9:15 A M Medical Record Number: [id number] Patient Account Number: [id number] Date of Birth/Sex: Treating RN: [birth year] (86 y.o. [name] Primary Care Shenita Trego: Lajean Manes T Other Clinician: Referring Alisabeth Selkirk: Treating Madisen Ludvigsen/Extender:  Rudean Hitt, Hal T Weeks in Treatment: 17 Vital Signs Height(in): 69 Pulse(bpm): 64 Weight(lbs): 163 Blood Pressure(mmHg): 145/66 Body Mass Index(BMI): 24 Temperature(F): 98.2 Respiratory Rate(breaths/min): 18 Photos: [4:No Photos Left, Medial Foot] [5:No Photos Left, Lateral Lower Leg] [N/A:N/A N/A] Wound Location: [4:Shear/Friction] [5:Gradually Appeared] [N/A:N/A] Wounding Event: [4:Pressure Ulcer] [5:Venous Leg Ulcer] [N/A:N/A] Primary Etiology: [4:Cataracts, Anemia, Arrhythmia,] [5:Cataracts, Anemia, Arrhythmia,] [N/A:N/A] Comorbid History: [4:Congestive Heart Failure, Hypertension, Peripheral Venous Disease, Received Radiation 01/19/2020] [5:Congestive Heart Failure, Hypertension, Peripheral Venous Disease, Received Radiation 03/21/2020] [N/A:N/A] Date Acquired: [4:12] [5:4] [N/A:N/A] Weeks of Treatment: [4:Open] [5:Open] [N/A:N/A] Wound Status: [4:0.5x0.9x0.1] [5:2.2x1.7x0.2] [N/A:N/A] Measurements L x W x D (cm) [4:0.353] [5:2.937] [N/A:N/A] A (cm) : rea [4:0.035] [5:0.587] [N/A:N/A] Volume (cm) : [4:-460.30%] [5:-483.90%] [N/A:N/A] % Reduction in A [4:rea: -483.30%] [5:-1074.00%] [N/A:N/A] % Reduction in Volume:  [4:Category/Stage II] [5:Unclassifiable] [N/A:N/A] Classification: [4:Small] [5:Medium] [N/A:N/A] Exudate A mount: [4:Serous] [5:Serosanguineous] [N/A:N/A] Exudate Type: [4:amber] [5:red, brown] [N/A:N/A] Exudate Color: [4:Flat and Intact] [5:Flat and Intact] [N/A:N/A] Wound Margin: [4:Small (1-33%)] [5:None Present (0%)] [N/A:N/A] Granulation A mount: [4:Pink, Pale] [5:N/A] [N/A:N/A] Granulation Quality: [4:None Present (0%)] [5:Large (67-100%)] [N/A:N/A] Necrotic A mount: [4:Fat Layer (Subcutaneous Tissue)] [5:Fascia: No] [N/A:N/A] Exposed Structures: [4:Exposed: Yes Fascia: No Tendon: No Muscle: No Joint: No Bone: No Large (67-100%)] [5:Fat Layer (Subcutaneous Tissue) Exposed: No Tendon: No Muscle: No Joint: No Bone: No None] [N/A:N/A] Epithelialization: [4:N/A] [5:Debridement - Excisional] [N/A:N/A] Debridement: Pre-procedure Verification/Time Out N/A [5:10:45] [N/A:N/A] Taken: [4:N/A] [5:Lidocaine 4% Topical Solution] [N/A:N/A] Pain Control: [4:N/A] [5:Subcutaneous, Slough] [N/A:N/A] Tissue Debrided: [4:N/A] [5:Skin/Subcutaneous Tissue] [N/A:N/A] Level: [4:N/A] [5:3.74] [N/A:N/A] Debridement A (sq cm): [4:rea N/A] [5:Curette] [N/A:N/A] Instrument: [4:N/A] [5:Minimum] [N/A:N/A] Bleeding: [4:N/A] [5:Pressure] [N/A:N/A] Hemostasis A chieved: [4:N/A] [5:6] [N/A:N/A] Procedural Pain: [4:N/A] [5:3] [N/A:N/A] Post Procedural Pain: [4:N/A] [5:Procedure was tolerated well] [N/A:N/A] Debridement Treatment Response: [4:N/A] [5:2.2x1.7x0.2] [N/A:N/A] Post Debridement Measurements L x W x D (cm) [4:N/A] [5:0.587] [N/A:N/A] Post Debridement Volume: (cm) [4:N/A] [5:redness to periwound noted.] [N/A:N/A] Assessment Notes: [4:N/A] [5:Compression [place] [N/A:N/A] Procedures Performed: [5:Debridement] Treatment Notes Wound #4 (Left, Medial Foot) 1. Cleanse With Wound Cleanser Soap and water 2. Periwound Care Skin Prep 3. Primary Dressing Applied Calcium Alginate Ag 4. Secondary  Dressing Dry Gauze Foam 5. Secured With Medipore tape Notes secondary foam donut. Wound #5 (Left, Lateral Lower Leg) 1. Cleanse With Wound Cleanser Soap and water 2. Periwound Care Moisturizing lotion TCA Cream 3. Primary Dressing Applied Calcium Alginate Ag 4. Secondary Dressing ABD Pad 6. Support Layer Applied 3 layer compression wrap Notes netting. Electronic Signature(s) Signed: 04/18/2020 4:59:38 PM By: Linton Ham MD Signed: 04/18/2020 5:35:25 PM By: [name] RN, BSN Entered By: Linton Ham on 04/18/2020 11:26:45 -------------------------------------------------------------------------------- Multi-Disciplinary Care Plan Details Patient Name: Date of Service: [name], [name] 04/18/2020 9:15 A M Medical Record Number: [id number] Patient Account Number: [id number] Date of Birth/Sex: Treating RN: [birth year] (86 y.o. [name] Primary Care Clora Ohmer: Lajean Manes T Other Clinician: Referring Leyna Vanderkolk: Treating Lawrnce Reyez/Extender: Evelena Peat in Treatment: 17 Active Inactive Abuse / Safety / Falls / Self Care Management Nursing Diagnoses: Potential for falls Goals: Patient/caregiver will verbalize understanding of skin care regimen Date Initiated: 12/20/2019 Date Inactivated: 02/14/2020 Target Resolution Date: 02/22/2020 Goal Status: Met Patient/caregiver will verbalize/demonstrate understanding of what to do in case of emergency Date Initiated: 12/20/2019 Target Resolution Date: 04/25/2020 Goal Status: Active Interventions: Assess fall risk on admission and as needed Provide education on fall prevention Notes: Pain, Acute or Chronic Nursing Diagnoses: Pain, acute or chronic: actual  or potential Potential alteration in comfort, pain Goals: Patient will verbalize adequate pain control and receive pain control interventions during procedures as needed Date Initiated: 12/20/2019 Date Inactivated: 02/14/2020 Target  Resolution Date: 02/22/2020 Goal Status: Met Patient/caregiver will verbalize comfort level met Date Initiated: 12/20/2019 Target Resolution Date: 04/25/2020 Goal Status: Active Interventions: Complete pain assessment as per visit requirements Provide education on pain management Notes: Wound/Skin Impairment Nursing Diagnoses: Knowledge deficit related to ulceration/compromised skin integrity Goals: Patient/caregiver will verbalize understanding of skin care regimen Date Initiated: 12/20/2019 Target Resolution Date: 04/25/2020 Goal Status: Active Interventions: Assess patient/caregiver ability to perform ulcer/skin care regimen upon admission and as needed Provide education on ulcer and skin care Treatment Activities: Skin care regimen initiated : 12/20/2019 Topical wound management initiated : 12/20/2019 Notes: Electronic Signature(s) Signed: 04/18/2020 5:35:25 PM By: [name] RN, BSN Entered By: [name] on 04/18/2020 09:31:20 -------------------------------------------------------------------------------- Pain Assessment Details Patient Name: Date of Service: [name]. 04/18/2020 9:15 A M Medical Record Number: [id number] Patient Account Number: [id number] Date of Birth/Sex: Treating RN: 08-17-1933 (87 y.o. [name] Primary Care Toye Rouillard: Lajean Manes T Other Clinician: Referring Naraya Stoneberg: Treating Linah Klapper/Extender: Evelena Peat in Treatment: 17 Active Problems Location of Pain Severity and Description of Pain Patient Has Paino No Site Locations Pain Management and Medication Current Pain Management: Electronic Signature(s) Signed: 04/18/2020 5:35:25 PM By: [name] RN, BSN Signed: 04/22/2020 8:52:48 AM By: Sandre Kitty Entered By: Sandre Kitty on 04/18/2020 09:19:41 -------------------------------------------------------------------------------- Patient/Caregiver Education Details Patient Name: Date of  Service: [name] 5/7/2021andnbsp9:15 A M Medical Record Number: [id number] Patient Account Number: [id number] Date of Birth/Gender: Treating RN: 23-Oct-1933 (86 y.o. [name] Primary Care Physician: Lajean Manes T Other Clinician: Referring Physician: Treating Physician/Extender: Evelena Peat in Treatment: 17 Education Assessment Education Provided To: Patient Education Topics Provided Venous: Methods: Explain/Verbal Responses: Reinforcements needed, State content correctly Wound/Skin Impairment: Methods: Explain/Verbal Responses: Reinforcements needed, State content correctly Electronic Signature(s) Signed: 04/18/2020 5:35:25 PM By: [name] RN, BSN Entered By: [name] on 04/18/2020 09:31:48 -------------------------------------------------------------------------------- Wound Assessment Details Patient Name: Date of Service: [name]. 04/18/2020 9:15 A M Medical Record Number: [id number] Patient Account Number: [id number] Date of Birth/Sex: Treating RN: 1933/08/06 (86 y.o. [name] Primary Care Zian Mohamed: Lajean Manes T Other Clinician: Referring Gila Lauf: Treating Franck Vinal/Extender: [name] in Treatment: 17 Wound Status Wound Number: 4 Primary Pressure Ulcer Etiology: Wound Location: Left, Medial Foot Wound Open Wounding Event: Shear/Friction Status: Date Acquired: 01/19/2020 Comorbid Cataracts, Anemia, Arrhythmia, Congestive Heart Failure, Weeks Of Treatment: 12 History: Hypertension, Peripheral Venous Disease, Received Radiation Clustered Wound: No Photos Photo Uploaded By: Mikeal Hawthorne on 04/21/2020 09:02:50 Wound Measurements Length: (cm) 0.5 % Re Width: (cm) 0.9 % Re Depth: (cm) 0.1 Epit Area: (cm) 0.353 Tun Volume: (cm) 0.035 Und duction in Area: -460.3% duction in Volume: -483.3% helialization: Large (67-100%) neling: No ermining:  No Wound Description Classification: Category/Stage II Foul Wound Margin: Flat and Intact Slou Exudate Amount: Small Exudate Type: Serous Exudate Color: amber Odor After Cleansing: No gh/Fibrino No Wound Bed Granulation Amount: Small (1-33%) Exposed Structure Granulation Quality: Pink, Pale Fascia Exposed: No Necrotic Amount: None Present (0%) Fat Layer (Subcutaneous Tissue) Exposed: Yes Tendon Exposed: No Muscle Exposed: No Joint Exposed: No Bone Exposed: No Treatment Notes Wound #4 (Left, Medial Foot) 1. Cleanse With Wound Cleanser Soap and water 2. Periwound Care Skin Prep 3. Primary Dressing Applied Calcium Alginate  Ag 4. Secondary Dressing Dry Gauze Foam 5. Secured With Medipore tape Notes secondary foam donut. Electronic Signature(s) Signed: 04/18/2020 4:59:21 PM By: Deon Pilling Signed: 04/18/2020 5:35:25 PM By: [name] RN, BSN Entered By: Deon Pilling on 04/18/2020 09:48:35 -------------------------------------------------------------------------------- Wound Assessment Details Patient Name: Date of Service: [name]. 04/18/2020 9:15 A M Medical Record Number: [id number] Patient Account Number: [id number] Date of Birth/Sex: Treating RN: 12/06/1933 (87 y.o. [name] Primary Care Lashaundra Lehrmann: Lajean Manes T Other Clinician: Referring Clydell Sposito: Treating Media Pizzini/Extender: [name] in Treatment: 17 Wound Status Wound Number: 5 Primary Venous Leg Ulcer Etiology: Wound Location: Left, Lateral Lower Leg Wound Open Wounding Event: Gradually Appeared Status: Date Acquired: 03/21/2020 Comorbid Cataracts, Anemia, Arrhythmia, Congestive Heart Failure, Weeks Of Treatment: 4 History: Hypertension, Peripheral Venous Disease, Received Radiation Clustered Wound: No Photos Photo Uploaded By: Mikeal Hawthorne on 04/21/2020 09:02:50 Wound Measurements Length: (cm) 2.2 Width: (cm) 1.7 Depth: (cm) 0.2 Area: (cm)  2.937 Volume: (cm) 0.587 % Reduction in Area: -483.9% % Reduction in Volume: -1074% Epithelialization: None Tunneling: No Undermining: No Wound Description Classification: Unclassifiable Wound Margin: Flat and Intact Exudate Amount: Medium Exudate Type: Serosanguineous Exudate Color: red, brown Foul Odor After Cleansing: No Slough/Fibrino Yes Wound Bed Granulation Amount: None Present (0%) Exposed Structure Necrotic Amount: Large (67-100%) Fascia Exposed: No Necrotic Quality: Adherent Slough Fat Layer (Subcutaneous Tissue) Exposed: No Tendon Exposed: No Muscle Exposed: No Joint Exposed: No Bone Exposed: No Assessment Notes redness to periwound noted. Treatment Notes Wound #5 (Left, Lateral Lower Leg) 1. Cleanse With Wound Cleanser Soap and water 2. Periwound Care Moisturizing lotion TCA Cream 3. Primary Dressing Applied Calcium Alginate Ag 4. Secondary Dressing ABD Pad 6. Support Layer Applied 3 layer compression wrap Notes netting. Electronic Signature(s) Signed: 04/18/2020 4:59:21 PM By: Deon Pilling Signed: 04/18/2020 5:35:25 PM By: [name] RN, BSN Entered By: Deon Pilling on 04/18/2020 09:49:20 -------------------------------------------------------------------------------- Vitals Details Patient Name: Date of Service: [name], Tracy J. 04/18/2020 9:15 A M Medical Record Number: [id number] Patient Account Number: [id number] Date of Birth/Sex: Treating RN: 1932-12-24 (86 y.o. [name] Primary Care Chaniah Cisse: Lajean Manes T Other Clinician: Referring Theophilus Walz: Treating Clemence Lengyel/Extender: Evelena Peat in Treatment: 17 Vital Signs Time Taken: 09:29 Temperature (F): 98.2 Height (in): 69 Pulse (bpm): 64 Weight (lbs): 163 Respiratory Rate (breaths/min): 18 Body Mass Index (BMI): 24.1 Blood Pressure (mmHg): 145/66 Reference Range: 80 - 120 mg / dl Electronic Signature(s) Signed: 04/22/2020 8:52:48 AM By:  Sandre Kitty Entered By: Sandre Kitty on 04/18/2020 09:30:06

## 2020-04-25 ENCOUNTER — Other Ambulatory Visit (HOSPITAL_BASED_OUTPATIENT_CLINIC_OR_DEPARTMENT_OTHER): Payer: Self-pay | Admitting: Internal Medicine

## 2020-04-25 ENCOUNTER — Encounter (HOSPITAL_BASED_OUTPATIENT_CLINIC_OR_DEPARTMENT_OTHER): Payer: Medicare Other | Admitting: Internal Medicine

## 2020-04-25 DIAGNOSIS — L97522 Non-pressure chronic ulcer of other part of left foot with fat layer exposed: Secondary | ICD-10-CM | POA: Diagnosis not present

## 2020-04-26 NOTE — Progress Notes (Signed)
[name], [name] ([id number]) Visit Report for 04/25/2020 Arrival Information Details Patient Name: Date of Service: [place], [name] 04/25/2020 8:45 A M Medical Record Number: [id number] Patient Account Number: [id number] Date of Birth/Sex: Treating RN: Oct 09, 1933 (86 y.o. [name] Primary Care Sairah Knobloch: Lajean Manes T Other Clinician: Referring Macedonio Scallon: Treating Alva Broxson/Extender: Evelena Peat in Treatment: 18 Visit Information History Since Last Visit Added or deleted any medications: No Patient Arrived: Ambulatory Any new allergies or adverse reactions: No Arrival Time: 08:45 Had a fall or experienced change in No Accompanied By: husband activities of daily living that may affect Transfer Assistance: None risk of falls: Patient Identification Verified: Yes Signs or symptoms of abuse/neglect since last visito No Secondary Verification Process Completed: Yes Hospitalized since last visit: No Patient Requires Transmission-Based Precautions: No Implantable device outside of the clinic excluding No Patient Has Alerts: Yes cellular tissue based products placed in the center Patient Alerts: Patient on Blood Thinner since last visit: Right ABI:0.99 Has Dressing in Place as Prescribed: Yes Has Compression in Place as Prescribed: Yes Pain Present Now: Yes Electronic Signature(s) Signed: 04/25/2020 4:30:07 PM By: Deon Pilling Entered By: Deon Pilling on 04/25/2020 12:04:56 -------------------------------------------------------------------------------- Compression Therapy Details Patient Name: Date of Service: [name], [name] 04/25/2020 8:45 A M Medical Record Number: [id number] Patient Account Number: [id number] Date of Birth/Sex: Treating RN: September 08, 1933 (86 y.o. [name] Primary Care Lorene Klimas: Patrina Levering Other Clinician: Referring Osvaldo Lamping: Treating Elizeth Weinrich/Extender: Evelena Peat in  Treatment: 18 Compression Therapy Performed for Wound Assessment: Wound #5 Left,Lateral Lower Leg Performed By: Clinician Deon Pilling, RN Compression Type: Three Layer Post Procedure Diagnosis Same as Pre-procedure Electronic Signature(s) Signed: 04/25/2020 4:51:42 PM By: Kela Millin Entered By: Kela Millin on 04/25/2020 09:39:05 -------------------------------------------------------------------------------- Encounter Discharge Information Details Patient Name: Date of Service: [name], [place]. 04/25/2020 8:45 A M Medical Record Number: [id number] Patient Account Number: [id number] Date of Birth/Sex: Treating RN: Sep 20, 1933 (86 y.o. [name] Primary Care Nash Bolls: Patrina Levering Other Clinician: Referring Cleatis Fandrich: Treating Yonael Tulloch/Extender: Evelena Peat in Treatment: 18 Encounter Discharge Information Items Post Procedure Vitals Discharge Condition: Stable Temperature (F): 97.7 Ambulatory Status: Ambulatory Pulse (bpm): 45 Discharge Destination: Home Respiratory Rate (breaths/min): 18 Transportation: Private Auto Blood Pressure (mmHg): 140/65 Accompanied By: husband Schedule Follow-up Appointment: Yes Clinical Summary of Care: Electronic Signature(s) Signed: 04/25/2020 4:30:07 PM By: Deon Pilling Entered By: Deon Pilling on 04/25/2020 12:06:16 -------------------------------------------------------------------------------- Lower Extremity Assessment Details Patient Name: Date of Service: [name], [name] 04/25/2020 8:45 A M Medical Record Number: [id number] Patient Account Number: [id number] Date of Birth/Sex: Treating RN: [birth year] (86 y.o. [name] Primary Care Braden Cimo: Lajean Manes T Other Clinician: Referring Kamryn Messineo: Treating Bradlee Heitman/Extender: [name] in Treatment: 18 Edema Assessment Assessed: Shirlyn Goltz: Yes] [Right: No] Edema: [Left: No] [Right:  No] Calf Left: Right: Point of Measurement: 42 cm From Medial Instep 31 cm cm Ankle Left: Right: Point of Measurement: 13 cm From Medial Instep 21 cm cm Vascular Assessment Pulses: Dorsalis Pedis Palpable: [Left:Yes] Electronic Signature(s) Signed: 04/25/2020 4:30:07 PM By: Deon Pilling Signed: 04/25/2020 4:51:42 PM By: Kela Millin Entered By: Deon Pilling on 04/25/2020 12:05:32 -------------------------------------------------------------------------------- Multi Wound Chart Details Patient Name: Date of Service: [name], [place]. 04/25/2020 8:45 A M Medical Record Number: [id number] Patient Account Number: [id number] Date of Birth/Sex: Treating RN: [birth year] (86 y.o. [name] Primary Care Seirra Kos: Patrina Levering Other Clinician: Referring  Torell Minder: Treating Laryah Neuser/Extender: Evelena Peat in Treatment: 18 Photos: [4:No Photos Left, Medial Foot] [5:No Photos Left, Lateral Lower Leg] [N/A:N/A N/A] Wound Location: [4:Shear/Friction] [5:Gradually Appeared] [N/A:N/A] Wounding Event: [4:Pressure Ulcer] [5:Venous Leg Ulcer] [N/A:N/A] Primary Etiology: [4:Cataracts, Anemia, Arrhythmia,] [5:Cataracts, Anemia, Arrhythmia,] [N/A:N/A] Comorbid History: [4:Congestive Heart Failure, Hypertension, Peripheral Venous Disease, Received Radiation 01/19/2020] [5:Congestive Heart Failure, Hypertension, Peripheral Venous Disease, Received Radiation 03/21/2020] [N/A:N/A] Date Acquired: [4:13] [5:5] [N/A:N/A] Weeks of Treatment: [4:Healed - Epithelialized] [5:Open] [N/A:N/A] Wound Status: [4:0x0x0] [5:2.9x2.7x0.2] [N/A:N/A] Measurements L x W x D (cm) [4:0] [5:6.15] [N/A:N/A] A (cm) : rea [4:0] [5:1.23] [N/A:N/A] Volume (cm) : [4:100.00%] [5:-1122.70%] [N/A:N/A] % Reduction in A rea: [4:100.00%] [5:-2360.00%] [N/A:N/A] % Reduction in Volume: [4:Category/Stage II] [5:Unclassifiable] [N/A:N/A] Classification: [4:None Present] [5:Large] [N/A:N/A] Exudate  A mount: [4:N/A] [5:Purulent] [N/A:N/A] Exudate Type: [4:N/A] [5:yellow, brown, green] [N/A:N/A] Exudate Color: [4:Distinct, outline attached] [5:Flat and Intact] [N/A:N/A] Wound Margin: [4:None Present (0%)] [5:None Present (0%)] [N/A:N/A] Granulation A mount: [4:None Present (0%)] [5:Large (67-100%)] [N/A:N/A] Necrotic A mount: [4:N/A] [5:Eschar, Adherent Slough] [N/A:N/A] Necrotic Tissue: [4:Fascia: No] [5:Fascia: No] [N/A:N/A] Exposed Structures: [4:Fat Layer (Subcutaneous Tissue) Exposed: No Tendon: No Muscle: No Joint: No Bone: No Large (67-100%)] [5:Fat Layer (Subcutaneous Tissue) Exposed: No Tendon: No Muscle: No Joint: No Bone: No None] [N/A:N/A] Epithelialization: [4:N/A] [5:Biopsy] [N/A:N/A] Procedures Performed: [5:Compression Therapy] Treatment Notes Electronic Signature(s) Signed: 04/25/2020 4:51:42 PM By: Kela Millin Signed: 04/26/2020 8:06:24 AM By: Linton Ham MD Entered By: Linton Ham on 04/25/2020 09:58:29 -------------------------------------------------------------------------------- Multi-Disciplinary Care Plan Details Patient Name: Date of Service: [name], [place]. 04/25/2020 8:45 A M Medical Record Number: [id number] Patient Account Number: [id number] Date of Birth/Sex: Treating RN: 11/06/1933 (86 y.o. [name] Primary Care Ashland Wiseman: Lajean Manes T Other Clinician: Referring Mariah Gerstenberger: Treating Talen Poser/Extender: Evelena Peat in Treatment: 15 Active Inactive Abuse / Safety / Falls / Self Care Management Nursing Diagnoses: Potential for falls Goals: Patient/caregiver will verbalize understanding of skin care regimen Date Initiated: 12/20/2019 Date Inactivated: 02/14/2020 Target Resolution Date: 02/22/2020 Goal Status: Met Patient/caregiver will verbalize/demonstrate understanding of what to do in case of emergency Date Initiated: 12/20/2019 Target Resolution Date: 05/30/2020 Goal Status:  Active Interventions: Assess fall risk on admission and as needed Provide education on fall prevention Notes: Pain, Acute or Chronic Nursing Diagnoses: Pain, acute or chronic: actual or potential Potential alteration in comfort, pain Goals: Patient will verbalize adequate pain control and receive pain control interventions during procedures as needed Date Initiated: 12/20/2019 Date Inactivated: 02/14/2020 Target Resolution Date: 02/22/2020 Goal Status: Met Patient/caregiver will verbalize comfort level met Date Initiated: 12/20/2019 Target Resolution Date: 05/30/2020 Goal Status: Active Interventions: Complete pain assessment as per visit requirements Provide education on pain management Notes: Wound/Skin Impairment Nursing Diagnoses: Knowledge deficit related to ulceration/compromised skin integrity Goals: Patient/caregiver will verbalize understanding of skin care regimen Date Initiated: 12/20/2019 Target Resolution Date: 05/30/2020 Goal Status: Active Interventions: Assess patient/caregiver ability to perform ulcer/skin care regimen upon admission and as needed Provide education on ulcer and skin care Treatment Activities: Skin care regimen initiated : 12/20/2019 Topical wound management initiated : 12/20/2019 Notes: Electronic Signature(s) Signed: 04/25/2020 4:51:42 PM By: Kela Millin Entered By: Kela Millin on 04/25/2020 08:53:04 -------------------------------------------------------------------------------- Pain Assessment Details Patient Name: Date of Service: [place]. 04/25/2020 8:45 A M Medical Record Number: [id number] Patient Account Number: [id number] Date of Birth/Sex: Treating RN: 1933/06/12 (86 y.o. [name] Primary Care Takuya Lariccia: Patrina Levering Other Clinician: Referring Hollie Wojahn: Treating Ludmila Ebarb/Extender: Dellia Nims  Secundino Ginger, Hal T Weeks in Treatment: 18 Active Problems Location of Pain Severity and Description of  Pain Patient Has Paino Yes Site Locations Pain Location: Pain in Ulcers Rate the pain. Current Pain Level: 7 Worst Pain Level: 10 Least Pain Level: 0 Tolerable Pain Level: 8 Pain Management and Medication Current Pain Management: Medication: No Cold Application: No Rest: No Massage: No Activity: No T.E.N.S.: No Heat Application: No Leg drop or elevation: No Is the Current Pain Management Adequate: Adequate How does your wound impact your activities of daily livingo Sleep: No Bathing: No Appetite: No Relationship With Others: No Bladder Continence: No Emotions: No Bowel Continence: No Work: No Toileting: No Drive: No Dressing: No Hobbies: No Electronic Signature(s) Signed: 04/25/2020 4:30:07 PM By: Deon Pilling Signed: 04/25/2020 4:51:42 PM By: Kela Millin Entered By: Deon Pilling on 04/25/2020 12:05:26 -------------------------------------------------------------------------------- Patient/Caregiver Education Details Patient Name: Date of Service: [name], [name]. 5/14/2021andnbsp8:45 A M Medical Record Number: [id number] Patient Account Number: [id number] Date of Birth/Gender: Treating RN: 05/13/1933 (86 y.o. [name] Primary Care Physician: Lajean Manes T Other Clinician: Referring Physician: Treating Physician/Extender: Evelena Peat in Treatment: 18 Education Assessment Education Provided To: Patient Education Topics Provided Pain: Handouts: A Guide to Pain Control Methods: Explain/Verbal Responses: State content correctly Safety: Handouts: Safety Methods: Explain/Verbal Responses: State content correctly Wound/Skin Impairment: Handouts: Caring for Your Ulcer Methods: Explain/Verbal Responses: State content correctly Electronic Signature(s) Signed: 04/25/2020 4:51:42 PM By: Kela Millin Entered By: Kela Millin on 04/25/2020  08:53:35 -------------------------------------------------------------------------------- Wound Assessment Details Patient Name: Date of Service: Tracy Sermon. 04/25/2020 8:45 A M Medical Record Number: [id number] Patient Account Number: [id number] Date of Birth/Sex: Treating RN: 1933-11-21 (86 y.o. [name] Primary Care Rakesha Dalporto: Lajean Manes T Other Clinician: Referring Tavia Stave: Treating Jamas Jaquay/Extender: [name] in Treatment: 18 Wound Status Wound Number: 4 Primary Pressure Ulcer Etiology: Wound Location: Left, Medial Foot Wound Healed - Epithelialized Wounding Event: Shear/Friction Status: Date Acquired: 01/19/2020 Comorbid Cataracts, Anemia, Arrhythmia, Congestive Heart Failure, Weeks Of Treatment: 13 History: Hypertension, Peripheral Venous Disease, Received Radiation Clustered Wound: No Wound Measurements Length: (cm) Width: (cm) Depth: (cm) Area: (cm) Volume: (cm) 0 % Reduction in Area: 100% 0 % Reduction in Volume: 100% 0 Epithelialization: Large (67-100%) 0 Tunneling: No 0 Undermining: No Wound Description Classification: Category/Stage II Wound Margin: Distinct, outline attached Exudate Amount: None Present Foul Odor After Cleansing: No Slough/Fibrino No Wound Bed Granulation Amount: None Present (0%) Exposed Structure Necrotic Amount: None Present (0%) Fascia Exposed: No Fat Layer (Subcutaneous Tissue) Exposed: No Tendon Exposed: No Muscle Exposed: No Joint Exposed: No Bone Exposed: No Electronic Signature(s) Signed: 04/25/2020 4:51:42 PM By: Kela Millin Entered By: Kela Millin on 04/25/2020 09:40:49 -------------------------------------------------------------------------------- Wound Assessment Details Patient Name: Date of Service: Tracy S[place]. 04/25/2020 8:45 A M Medical Record Number: [id number] Patient Account Number: [id number] Date of Birth/Sex: Treating RN: 06-27-1933 (86  y.o. [name] Primary Care Voris Tigert: Lajean Manes T Other Clinician: Referring Emmeline Winebarger: Treating Kiing Deakin/Extender: [name] in Treatment: 18 Wound Status Wound Number: 5 Primary Venous Leg Ulcer Etiology: Wound Location: Left, Lateral Lower Leg Wound Open Wounding Event: Gradually Appeared Status: Date Acquired: 03/21/2020 Comorbid Cataracts, Anemia, Arrhythmia, Congestive Heart Failure, Weeks Of Treatment: 5 History: Hypertension, Peripheral Venous Disease, Received Radiation Clustered Wound: No Wound Measurements Length: (cm) 2.9 Width: (cm) 2.7 Depth: (cm) 0.2 Area: (cm) 6.15 Volume: (cm) 1.23 % Reduction in Area: -1122.7% % Reduction  in Volume: -2360% Epithelialization: None Tunneling: No Undermining: No Wound Description Classification: Unclassifiable Wound Margin: Flat and Intact Exudate Amount: Large Exudate Type: Purulent Exudate Color: yellow, brown, green Foul Odor After Cleansing: No Slough/Fibrino Yes Wound Bed Granulation Amount: None Present (0%) Exposed Structure Necrotic Amount: Large (67-100%) Fascia Exposed: No Necrotic Quality: Eschar, Adherent Slough Fat Layer (Subcutaneous Tissue) Exposed: No Tendon Exposed: No Muscle Exposed: No Joint Exposed: No Bone Exposed: No Treatment Notes Wound #5 (Left, Lateral Lower Leg) 1. Cleanse With Wound Cleanser Soap and water 2. Periwound Care Moisturizing lotion TCA Cream 3. Primary Dressing Applied Calcium Alginate Ag 4. Secondary Dressing ABD Pad 6. Support Layer Applied 3 layer compression wrap Notes netting. Electronic Signature(s) Signed: 04/25/2020 4:30:07 PM By: Deon Pilling Signed: 04/25/2020 4:51:42 PM By: Kela Millin Entered By: Deon Pilling on 04/25/2020 08:57:59 -------------------------------------------------------------------------------- Vitals Details Patient Name: Date of Service: [name], [name]. 04/25/2020 8:45 A  M Medical Record Number: [id number] Patient Account Number: [id number] Date of Birth/Sex: Treating RN: 06-06-1933 (86 y.o. [name] Primary Care Alea Ryer: Lajean Manes T Other Clinician: Referring Kaleena Corrow: Treating Tymira Horkey/Extender: Evelena Peat in Treatment: 18 Vital Signs Time Taken: 08:45 Temperature (F): 97.7 Height (in): 69 Pulse (bpm): 45 Weight (lbs): 163 Respiratory Rate (breaths/min): 18 Body Mass Index (BMI): 24.1 Blood Pressure (mmHg): 140/65 Reference Range: 80 - 120 mg / dl Electronic Signature(s) Signed: 04/25/2020 4:30:07 PM By: Deon Pilling Entered By: Deon Pilling on 04/25/2020 12:05:10

## 2020-04-26 NOTE — Progress Notes (Addendum)
[name], [name] ([id number]) Visit Report for 04/25/2020 Biopsy Details Patient Name: Date of Service: [place], [name] 04/25/2020 8:45 A M Medical Record Number: [id number] Patient Account Number: [id number] Date of Birth/Sex: Treating RN: 12/25/1932 (86 y.o. [name] Primary Care Provider: Patrina Green Other Clinician: Referring Provider: Treating Provider/Extender: [name] in Treatment: 18 Biopsy Performed for: Wound #5 Left, Lateral Lower Leg Location(s): Wound Bed, Wound Margin Performed By: Physician [name]., MD Tissue Punch: No Number of Specimens T aken: 2 Specimen Sent T Pathology: o Yes Level of Consciousness (Pre-procedure): Awake and Alert Pre-procedure Verification/Time-Out Taken: Yes - 09:25 Pain Control: Lidocaine Injectable Lidocaine Percent: 1% Instrument: Blade, Forceps Bleeding: Moderate Hemostasis Achieved: Silver Nitrate Procedural Pain: 0 Post Procedural Pain: 0 Response to Treatment: Procedure was tolerated well Level of Consciousness (Post-procedure): Awake and Alert Post Procedure Diagnosis Same as Pre-procedure Electronic Signature(s) Signed: 04/26/2020 8:06:24 AM By: Tracy Ham MD Entered By: [name] on 04/25/2020 09:58:40 -------------------------------------------------------------------------------- HPI Details Patient Name: Date of Service: Tracy Banker J. 04/25/2020 8:45 A M Medical Record Number: [id number] Patient Account Number: [id number] Date of Birth/Sex: Treating RN: [birth year] (86 y.o. [name] Primary Care Provider: Patrina Green Other Clinician: Referring Provider: Treating Provider/Extender: [name] in Treatment: 18 History of Present Illness HPI Description: ADMISSION 12/20/2019 This is an 86 year old woman referred by her primary physician Dr. Felipa Green for review of a wound on her right lateral lower leg. She was  actually in this clinic on 2 separate occasions in 2010 and 2012 cared for by Dr. Sherilyn [name]. At that point in time she had wounds on her right leg as well. She tells [place] to 1 month ago she noticed a scab building up on her right lateral lower leg this opened into a wound. There was no overt cause of this no trauma, no infection that she is aware of. She has a history of chronic venous insufficiency and wears compression stockings fairly religiously indeed she is done well over the last 8 years since she was last in this clinic. She has been applying Vaseline on this and a covering phone. This is not progressing towards healing. Past medical history; interstitial lung disease, chronic atrial fibrillation status post pacemaker, osteoarthritis of the left knee, left breast CA, chronic repeat venous insufficiency. She takes [name] for her atrial fibrillation stroke prophylaxis. ABI in our clinic was 0.99 on the right 1/15; superficial wound on the right lateral calf in the setting of severe acute skin changes from chronic venous insufficiency and lymphedema. We used Iodoflex last week she complained of a lot of pain 1/22; this is a small but difficult wound on the right lateral calf in the setting of severe chronic venous insufficiency and secondary lymphedema. She continues to state the wounds things hurts when she is up on it but seems to be relieved by putting her leg up. I changed her to [facility] last week because the Iodoflex seem to be causing stinging. 1/29. This wound appears to be contracting somewhat. Changes of chronic venous insufficiency with secondary lymphedema 2/12; not as good as surface today and slightly bigger. I had to change her from Iodoflex to [facility] because of the stinging pain although she does not think it was any better on the [facility]. She comes into clinic today with a area on the medial left great toe. She said she noticed blood on her sock last Saturday.  She had some form of bunion surgery by Dr. Ila Green her podiatrist sometime in 2019 she said he "shave the area". I could not find his operative report although I did see reference to the bunion in the area. 2/19; somewhat improved wound on the right lateral lower leg however the wound she came in on the bunion of her left great toe is actually I think larger still somewhat inflamed. We have been using Iodoflex 2/26; not much improvement in either wound area which is the original venous wound on the right lateral lower leg and in the area on the bunion of her left great toe. This still looks somewhat inflamed and tender. We have been using Iodoflex with open much improvement. 3/4; in general both her wounds look better this includes the original venous insufficiency wound on the right lateral lower leg and the area on her tip of her bunion on the left great toe medial aspect of the MTP. Change to Hydrofera Blue last week 3/12; the patient still has a small geographic shaped wound on the right lateral lower leg. We have been using Hydrofera Blue but I changed her to endoform today. She also has the area in the tip of the bunion of the left great toe. We will try Hydrofera Blue here as well 3/19; the small geographic wound on the right lateral lower leg has not filled in. There is some surrounding induration we have noticed this previously. This may be venous inflammation but I wonder about biopsying this if this is not closing up. The area over the left medial first MTP [bunion deformity] requires debridement. This is not closing in. I am not convinced she is offloading adequately 3/26; right lateral lower leg in the setting of severe venous insufficiency and also an area over the first MTP bunion deformity. No debridement is required. We have been using endoform 4/9; right lateral lower leg wound in the setting of severe venous insufficiency and also a refractory area over the first MTP bunion  deformity. The area on the right lateral lower leg is just about closed there is still a minor open area here. She comes in today with a mirror image area on the left lateral lower leg. She says this started as a scab 2 Green ago and is gradually morphed into an open wound very similar to what she has on the right leg. She has severe bilateral venous insufficiency with venous hypertension obvious from clinical exam. 4/16; her original wound the right lateral leg wound in the setting of chronic venous hypertension is a small open area but very small. We have been using endoform. Her wound over the left first MTP bunion deformity also appears to be small and closing in. Unfortunately she has a large relative area on the left lateral calf which was new last week. Very adherent debris over this we have been using Iodoflex however that may be contributing to the debris on the wound surface 4/23; the original wound is closed and the area on the left first metatarsal bunion deformity is also almost closed the new area from last week required debridement. She went for her reflux studies that did not take her lower extremity wraps off. This is not helpful. She did have significant reflux in the right common femoral vein. I am not going to press this issue further. She clinically has severe venous hypertension 4/29; the original wound is closed on the right lateral lower calf. The area on the left first metatarsal/bunion  deformity has a small slitlike opening that is still not closed. The real problem here is now wound on the left lateral calf which is necrotic and deep. We used Iodoflex on this wound last time. Silver alginate on the left first toe The patient has Farrow wraps. We should be able to transition the right leg into compression stockings and were doing this today. 5/7; the original wound remains closed on the right lateral calf. The left first metatarsal head still is open. Deterioration in the  wound which is the new wound from several Green ago on the left lateral calf. The patient is not aware how this could have happened. We have been using Sorbact starting last week under 3 layer compression 5/14; the right lateral calf is closed the area on the first met head on the left is closed However the new area from 3 Green ago on the left lateral calf continues to expand. There is raised nodular tissue around the wound necrotic debris on the surface. Circumference of the wound also looks necrotic. It looks as though there is involvement of the tissue under the skin around the large area of this wound which looks threatened. She is complaining of pain. Culture of this wound I did last week showed rare coag negative staph variant I have never heard of although I am doubtful this has clinical significance Electronic Signature(s) Signed: 04/26/2020 8:06:24 AM By: Tracy Ham MD Entered By: [name] on 04/25/2020 10:01:31 -------------------------------------------------------------------------------- Physical Exam Details Patient Name: Date of Service: Tracy Banker J. 04/25/2020 8:45 A M Medical Record Number: [id number] Patient Account Number: [id number] Date of Birth/Sex: Treating RN: 09/06/1933 (86 y.o. [name] Primary Care Provider: Patrina Green Other Clinician: Referring Provider: Treating Provider/Extender: [name] in Treatment: 18 Notes Wound exam; increasing problems on the left lateral lower leg. The original wound which was necrotic and punched out has expanded now with surrounding nodular tissue around the edges. It also looks like a large area of skin around this has something developing subcutaneously. This does not look like typical cellulitis. Electronic Signature(s) Signed: 04/26/2020 8:06:24 AM By: Tracy Ham MD Entered By: [name] on 04/25/2020  10:00:42 -------------------------------------------------------------------------------- Physician Orders Details Patient Name: Date of Service: Tracy Sermon. 04/25/2020 8:45 A M Medical Record Number: [id number] Patient Account Number: [id number] Date of Birth/Sex: Treating RN: May 20, 1933 (86 y.o. [name] Primary Care Provider: Lajean Manes T Other Clinician: Referring Provider: Treating Provider/Extender: [name] in Treatment: 44 Verbal / Phone Orders: No Diagnosis Coding ICD-10 Coding Code Description I87.321 Chronic venous hypertension (idiopathic) with inflammation of right lower extremity L97.828 Non-pressure chronic ulcer of other part of left lower leg with other specified severity I87.322 Chronic venous hypertension (idiopathic) with inflammation of left lower extremity L97.522 Non-pressure chronic ulcer of other part of left foot with fat layer exposed Follow-up Appointments ppointment in 1 week. - Friday afternoon Return A Other: - Schedule Appointment With Dermatologist Dressing Change Frequency Wound #5 Left,Lateral Lower Leg Do not change entire dressing for one week. Skin Barriers/Peri-Wound Care Moisturizing lotion - patient to lotion right leg every night. TCA Cream or Ointment - mixed with lotion to left leg Wound Cleansing May shower with protection. - cast protector Primary Wound Dressing Wound #5 Left,Lateral Lower Leg Calcium Alginate with Silver Secondary Dressing Wound #5 Left,Lateral Lower Leg Dry Gauze ABD pad Edema Control 3 Layer Compression System - Left Lower Extremity - use kerlix  instead of cotton Avoid standing for long periods of time Elevate legs to the level of the heart or above for 30 minutes daily and/or when sitting, a frequency of: - throughout the day. Exercise regularly Support Garment 20-30 mm/Hg pressure to: - patient to apply farrow wrap 4000 to right leg. Apply in the morning  and remove at night. Off-Loading Open toe surgical shoe to: - left Laboratory Bacteria identified in Tissue by Biopsy culture (MICRO) - x2, periwound and wound bed Biopsy - (ICD10 L38.101 - Non-pressure chronic ulcer of other part of left lower leg with other specified severity) LOINC Code: 20474-3 Convenience Name: Biopsy specimen culture Patient Medications llergies: codeine, adhesive, latex, Percodan A Notifications Medication Indication Start End wound infecton left leg 04/25/2020 doxycycline monohydrate DOSE oral 100 mg capsule - 1 capsule oral bid for 7 days Electronic Signature(s) Signed: 05/02/2020 6:00:23 PM By: Tracy Ham MD Signed: 09/01/2020 1:35:35 PM By: Kela Millin Previous Signature: 04/25/2020 10:04:22 AM Version By: Tracy Ham MD Entered By: Kela Millin on 05/02/2020 08:02:56 -------------------------------------------------------------------------------- Problem List Details Patient Name: Date of Service: [place], [place]. 04/25/2020 8:45 A M Medical Record Number: [id number] Patient Account Number: [id number] Date of Birth/Sex: Treating RN: Aug 06, 1933 (86 y.o. [name] Primary Care Provider: Lajean Manes T Other Clinician: Referring Provider: Treating Provider/Extender: [name] in Treatment: 18 Active Problems ICD-10 Encounter Code Description Active Date MDM Diagnosis I87.321 Chronic venous hypertension (idiopathic) with inflammation of right lower 12/20/2019 No Yes extremity L97.828 Non-pressure chronic ulcer of other part of left lower leg with other specified 03/21/2020 No Yes severity I87.322 Chronic venous hypertension (idiopathic) with inflammation of left lower 02/14/2020 No Yes extremity L97.522 Non-pressure chronic ulcer of other part of left foot with fat layer exposed 01/25/2020 No Yes Inactive Problems ICD-10 Code Description Active Date Inactive Date L97.812 Non-pressure chronic  ulcer of other part of right lower leg with fat layer exposed 12/20/2019 12/20/2019 Resolved Problems Electronic Signature(s) Signed: 04/26/2020 8:06:24 AM By: Tracy Ham MD Entered By: [name] on 04/25/2020 09:58:16 -------------------------------------------------------------------------------- Progress Note Details Patient Name: Date of Service: Tracy Banker J. 04/25/2020 8:45 A M Medical Record Number: [id number] Patient Account Number: [id number] Date of Birth/Sex: Treating RN: 01/29/1933 (86 y.o. [name] Primary Care Provider: Patrina Green Other Clinician: Referring Provider: Treating Provider/Extender: [name] in Treatment: 18 Subjective History of Present Illness (HPI) ADMISSION 12/20/2019 This is an 86 year old woman referred by her primary physician Dr. Felipa Green for review of a wound on her right lateral lower leg. She was actually in this clinic on 2 separate occasions in 2010 and 2012 cared for by Dr. Sherilyn [name]. At that point in time she had wounds on her right leg as well. She tells [place] to 1 month ago she noticed a scab building up on her right lateral lower leg this opened into a wound. There was no overt cause of this no trauma, no infection that she is aware of. She has a history of chronic venous insufficiency and wears compression stockings fairly religiously indeed she is done well over the last 8 years since she was last in this clinic. She has been applying Vaseline on this and a covering phone. This is not progressing towards healing. Past medical history; interstitial lung disease, chronic atrial fibrillation status post pacemaker, osteoarthritis of the left knee, left breast CA, chronic repeat venous insufficiency. She takes [name] for her atrial fibrillation stroke prophylaxis. ABI in our clinic  was 0.99 on the right 1/15; superficial wound on the right lateral calf in the setting of severe acute skin changes  from chronic venous insufficiency and lymphedema. We used Iodoflex last week she complained of a lot of pain 1/22; this is a small but difficult wound on the right lateral calf in the setting of severe chronic venous insufficiency and secondary lymphedema. She continues to state the wounds things hurts when she is up on it but seems to be relieved by putting her leg up. I changed her to [facility] last week because the Iodoflex seem to be causing stinging. 1/29. This wound appears to be contracting somewhat. Changes of chronic venous insufficiency with secondary lymphedema 2/12; not as good as surface today and slightly bigger. I had to change her from Iodoflex to [facility] because of the stinging pain although she does not think it was any better on the [facility]. ooShe comes into clinic today with a area on the medial left great toe. She said she noticed blood on her sock last Saturday. She had some form of bunion surgery by Dr. Ila Green her podiatrist sometime in 2019 she said he "shave the area". I could not find his operative report although I did see reference to the bunion in the area. 2/19; somewhat improved wound on the right lateral lower leg however the wound she came in on the bunion of her left great toe is actually I think larger still somewhat inflamed. We have been using Iodoflex 2/26; not much improvement in either wound area which is the original venous wound on the right lateral lower leg and in the area on the bunion of her left great toe. This still looks somewhat inflamed and tender. We have been using Iodoflex with open much improvement. 3/4; in general both her wounds look better this includes the original venous insufficiency wound on the right lateral lower leg and the area on her tip of her bunion on the left great toe medial aspect of the MTP. Change to Hydrofera Blue last week 3/12; the patient still has a small geographic shaped wound on the right  lateral lower leg. We have been using Hydrofera Blue but I changed her to endoform today. She also has the area in the tip of the bunion of the left great toe. We will try Hydrofera Blue here as well 3/19; the small geographic wound on the right lateral lower leg has not filled in. There is some surrounding induration we have noticed this previously. This may be venous inflammation but I wonder about biopsying this if this is not closing up. The area over the left medial first MTP [bunion deformity] requires debridement. This is not closing in. I am not convinced she is offloading adequately 3/26; right lateral lower leg in the setting of severe venous insufficiency and also an area over the first MTP bunion deformity. No debridement is required. We have been using endoform 4/9; right lateral lower leg wound in the setting of severe venous insufficiency and also a refractory area over the first MTP bunion deformity. The area on the right lateral lower leg is just about closed there is still a minor open area here. She comes in today with a mirror image area on the left lateral lower leg. She says this started as a scab 2 Green ago and is gradually morphed into an open wound very similar to what she has on the right leg. She has severe bilateral venous insufficiency with venous  hypertension obvious from clinical exam. 4/16; her original wound the right lateral leg wound in the setting of chronic venous hypertension is a small open area but very small. We have been using endoform. Her wound over the left first MTP bunion deformity also appears to be small and closing in. Unfortunately she has a large relative area on the left lateral calf which was new last week. Very adherent debris over this we have been using Iodoflex however that may be contributing to the debris on the wound surface 4/23; the original wound is closed and the area on the left first metatarsal bunion deformity is also almost closed  the new area from last week required debridement. She went for her reflux studies that did not take her lower extremity wraps off. This is not helpful. She did have significant reflux in the right common femoral vein. I am not going to press this issue further. She clinically has severe venous hypertension 4/29; the original wound is closed on the right lateral lower calf. The area on the left first metatarsal/bunion deformity has a small slitlike opening that is still not closed. The real problem here is now wound on the left lateral calf which is necrotic and deep. We used Iodoflex on this wound last time. Silver alginate on the left first toe The patient has Farrow wraps. We should be able to transition the right leg into compression stockings and were doing this today. 5/7; the original wound remains closed on the right lateral calf. The left first metatarsal head still is open. Deterioration in the wound which is the new wound from several Green ago on the left lateral calf. The patient is not aware how this could have happened. We have been using Sorbact starting last week under 3 layer compression 5/14; the right lateral calf is closed the area on the first met head on the left is closed However the new area from 3 Green ago on the left lateral calf continues to expand. There is raised nodular tissue around the wound necrotic debris on the surface. Circumference of the wound also looks necrotic. It looks as though there is involvement of the tissue under the skin around the large area of this wound which looks threatened. She is complaining of pain. Culture of this wound I did last week showed rare coag negative staph variant I have never heard of although I am doubtful this has clinical significance Objective Integumentary (Hair, Skin) Wound #4 status is Healed - Epithelialized. Original cause of wound was Shear/Friction. The wound is located on the Left,Medial Foot. The wound measures 0cm  length x 0cm width x 0cm depth; 0cm^2 area and 0cm^3 volume. There is no tunneling or undermining noted. There is a none present amount of drainage noted. The wound margin is distinct with the outline attached to the wound base. There is no granulation within the wound bed. There is no necrotic tissue within the wound bed. Wound #5 status is Open. Original cause of wound was Gradually Appeared. The wound is located on the Left,Lateral Lower Leg. The wound measures 2.9cm length x 2.7cm width x 0.2cm depth; 6.15cm^2 area and 1.23cm^3 volume. There is no tunneling or undermining noted. There is a large amount of purulent drainage noted. The wound margin is flat and intact. There is no granulation within the wound bed. There is a large (67-100%) amount of necrotic tissue within the wound bed including Eschar and Adherent Slough. Assessment Active Problems ICD-10 Chronic venous hypertension (idiopathic) with  inflammation of right lower extremity Non-pressure chronic ulcer of other part of left lower leg with other specified severity Chronic venous hypertension (idiopathic) with inflammation of left lower extremity Non-pressure chronic ulcer of other part of left foot with fat layer exposed Procedures Wound #5 Pre-procedure diagnosis of Wound #5 is a Venous Leg Ulcer located on the Left, Lateral Lower Leg . There was a biopsy performed by [name]., MD. There was a biopsy performed on Wound Bed, Wound Margin. The skin was cleansed and prepped with anti-septic followed by pain control using Lidocaine Injectable: 1%. Tissue was removed at its base with the following instrument(s): Blade and Forceps and sent to pathology. A Moderate amount of bleeding was controlled with Silver Nitrate. A time out was conducted at 09:25, prior to the start of the procedure. The procedure was tolerated well with a pain level of 0 throughout and a pain level of 0 following the procedure. Post procedure Diagnosis  Wound #5: Same as Pre-Procedure Pre-procedure diagnosis of Wound #5 is a Venous Leg Ulcer located on the Left,Lateral Lower Leg . There was a Three Layer Compression Therapy Procedure by Deon Pilling, RN. Post procedure Diagnosis Wound #5: Same as Pre-Procedure Plan Follow-up Appointments: Return Appointment in 1 week. - Friday afternoon Other: - Schedule Appointment With Dermatologist Dressing Change Frequency: Wound #5 Left,Lateral Lower Leg: Do not change entire dressing for one week. Skin Barriers/Peri-Wound Care: Moisturizing lotion - patient to lotion right leg every night. TCA Cream or Ointment - mixed with lotion to left leg Wound Cleansing: May shower with protection. - cast protector Primary Wound Dressing: Wound #5 Left,Lateral Lower Leg: Calcium Alginate with Silver Secondary Dressing: Wound #5 Left,Lateral Lower Leg: Dry Gauze ABD pad Edema Control: 3 Layer Compression System - Left Lower Extremity - use kerlix instead of cotton Avoid standing for long periods of time Elevate legs to the level of the heart or above for 30 minutes daily and/or when sitting, a frequency of: - throughout the day. Exercise regularly Support Garment 20-30 mm/Hg pressure to: - patient to apply farrow wrap 4000 to right leg. Apply in the morning and remove at night. Off-Loading: Open toe surgical shoe to: - left 1. Very concerning there a deterioration in this wound. 2. I anesthetized the 2 parts of the circumference of this wound for pathology 3. I have asked him to make appointment with her dermatologist Dr. Ubaldo Glassing. If the superficial shave biopsies I have done are unhelpful I think she is going to need punch biopsies. 4. I do not debate that this woman has venous insufficiency however this does not look like a typical venous insufficiency wound it is rapidly worsening 5. Empiric doxycycline 100 twice daily for 7 days although I am very doubtful that this is a bacterial infection. 6. I  went back over the history here there was no trauma, no PUNCTURE, no skin lesions. Very atypical wound Electronic Signature(s) Signed: 04/26/2020 8:06:24 AM By: Tracy Ham MD Entered By: [name] on 04/25/2020 10:02:57 -------------------------------------------------------------------------------- SuperBill Details Patient Name: Date of Service: Tracy Sermon. 04/25/2020 Medical Record Number: [id number] Patient Account Number: [id number] Date of Birth/Sex: Treating RN: 11-04-1933 (86 y.o. [name] Primary Care Provider: Patrina Green Other Clinician: Referring Provider: Treating Provider/Extender: [name] in Treatment: 18 Diagnosis Coding ICD-10 Codes Code Description [phone] Chronic venous hypertension (idiopathic) with inflammation of right lower extremity L97.828 Non-pressure chronic ulcer of other part of left lower leg with other specified  severity I87.322 Chronic venous hypertension (idiopathic) with inflammation of left lower extremity L97.522 Non-pressure chronic ulcer of other part of left foot with fat layer exposed Facility Procedures The patient participates with Medicare or their insurance follows the Medicare Facility Guidelines: CPT4 Code Description Modifier Quantity [id number] 11102-Tangential biopsy of skin (e.g., shave, scoop, saucerize, curette) single lesion 2 ICD-10 Diagnosis  Description L97.828 Non-pressure chronic ulcer of other part of left lower leg with other specified severity Physician Procedures : CPT4 Code Description Modifier 11102 Tangential biopsy of skin (e.g., shave, scoop, saucerize, curette) single lesion ICD-10 Diagnosis Description L97.828 Non-pressure chronic ulcer of other part of left lower leg with other specified severity Quantity: 2 Electronic Signature(s) Signed: 04/26/2020 8:06:24 AM By: Tracy Ham MD Entered By: [name] on 04/25/2020 10:04:38

## 2020-04-30 ENCOUNTER — Encounter (HOSPITAL_BASED_OUTPATIENT_CLINIC_OR_DEPARTMENT_OTHER): Payer: Medicare Other | Admitting: Physician Assistant

## 2020-04-30 ENCOUNTER — Other Ambulatory Visit: Payer: Self-pay

## 2020-04-30 DIAGNOSIS — L97522 Non-pressure chronic ulcer of other part of left foot with fat layer exposed: Secondary | ICD-10-CM | POA: Diagnosis not present

## 2020-04-30 NOTE — Progress Notes (Signed)
Tracy, Green ([id number]) Visit Report for 04/30/2020 Arrival Information Details Patient Name: Date of Service: [place], [name] 04/30/2020 8:45 A M Medical Record Number: [id number] Patient Account Number: [id number] Date of Birth/Sex: Treating RN: 10-24-1933 (86 y.o. [name] Primary Care Maurisa Tesmer: Lajean Manes T Other Clinician: Referring Ludene Stokke: Treating Marianela Mandrell/Extender: Nelda Severe Weeks in Treatment: 18 Visit Information History Since Last Visit All ordered tests and consults were completed: Yes Patient Arrived: Ambulatory Added or deleted any medications: Yes Arrival Time: 08:50 Any new allergies or adverse reactions: No Accompanied By: husband Had a fall or experienced change in No Transfer Assistance: None activities of daily living that may affect Patient Identification Verified: Yes risk of falls: Secondary Verification Process Completed: Yes Signs or symptoms of abuse/neglect since last visito No Patient Requires Transmission-Based Precautions: No Hospitalized since last visit: No Patient Has Alerts: Yes Implantable device outside of the clinic excluding No Patient Alerts: Patient on Blood Thinner cellular tissue based products placed in the center Right ABI:0.99 since last visit: Has Dressing in Place as Prescribed: Yes Has Compression in Place as Prescribed: Yes Pain Present Now: No Notes seen Dermatology on Friday 04/25/2020. Per patient started Trental in addition to her Eliquis. See medication list. Electronic Signature(s) Signed: 04/30/2020 1:21:06 PM By: Deon Pilling Entered By: Deon Pilling on 04/30/2020 09:18:00 -------------------------------------------------------------------------------- Compression Therapy Details Patient Name: Date of Service: Tracy, Green 04/30/2020 8:45 A M Medical Record Number: [id number] Patient Account Number: [id number] Date of Birth/Sex: Treating RN: [birth year] (86 y.o. [name] Primary Care Emelina Hinch: Lajean Manes T Other Clinician: Referring Pura Picinich: Treating Cristopher Ciccarelli/Extender: Mike Craze T Weeks in Treatment: 18 Compression Therapy Performed for Wound Assessment: Wound #5 Left,Lateral Lower Leg Performed By: Clinician Deon Pilling, RN Compression Type: Three Layer Electronic Signature(s) Signed: 04/30/2020 1:21:06 PM By: Deon Pilling Entered By: Deon Pilling on 04/30/2020 09:19:13 -------------------------------------------------------------------------------- Encounter Discharge Information Details Patient Name: Date of Service: [name], [name]. 04/30/2020 8:45 A M Medical Record Number: [id number] Patient Account Number: [id number] Date of Birth/Sex: Treating RN: 09-May-1933 (86 y.o. [name] Primary Care Yasira Engelson: Lajean Manes T Other Clinician: Referring Tyarra Nolton: Treating Hazelee Harbold/Extender: Maryruth Eve in Treatment: 82 Encounter Discharge Information Items Discharge Condition: Stable Ambulatory Status: Ambulatory Discharge Destination: Home Transportation: Private Auto Accompanied By: husband Schedule Follow-up Appointment: Yes Clinical Summary of Care: Electronic Signature(s) Signed: 04/30/2020 1:21:06 PM By: Deon Pilling Entered By: Deon Pilling on 04/30/2020 09:19:55 -------------------------------------------------------------------------------- Patient/Caregiver Education Details Patient Name: Date of Service: [name], [name] 5/19/2021andnbsp8:45 A M Medical Record Number: [id number] Patient Account Number: [id number] Date of Birth/Gender: Treating RN: [birth year] (86 y.o. [name] Primary Care Physician: Lajean Manes T Other Clinician: Referring Physician: Treating Physician/Extender: Maryruth Eve in Treatment: 39 Education Assessment Education Provided To: Patient Education Topics Provided Pain: Handouts: A  Guide to Pain Control Methods: Explain/Verbal Responses: Reinforcements needed Electronic Signature(s) Signed: 04/30/2020 1:21:06 PM By: Deon Pilling Entered By: Deon Pilling on 04/30/2020 09:19:45 -------------------------------------------------------------------------------- Wound Assessment Details Patient Name: Date of Service: [name]. 04/30/2020 8:45 A M Medical Record Number: [id number] Patient Account Number: [id number] Date of Birth/Sex: Treating RN: 11/10/1933 (86 y.o. [name] Primary Care Nyaire Denbleyker: Lajean Manes T Other Clinician: Referring Ashby Leflore: Treating Kian Ottaviano/Extender: Fredderick Erb, Hal T Weeks in Treatment: 18 Wound Status Wound Number: 5 Primary Venous Leg Ulcer Etiology: Wound Location: Left, Lateral Lower Leg  Wound Open Wounding Event: Gradually Appeared Status: Date Acquired: 03/21/2020 Comorbid Cataracts, Anemia, Arrhythmia, Congestive Heart Failure, Weeks Of Treatment: 5 History: Hypertension, Peripheral Venous Disease, Received Radiation Clustered Wound: No Wound Measurements Length: (cm) 2.9 Width: (cm) 2.7 Depth: (cm) 0.2 Area: (cm) 6.15 Volume: (cm) 1.23 % Reduction in Area: -1122.7% % Reduction in Volume: -2360% Epithelialization: None Tunneling: No Undermining: No Wound Description Classification: Unclassifiable Wound Margin: Flat and Intact Exudate Amount: Large Exudate Type: Purulent Exudate Color: yellow, brown, green Foul Odor After Cleansing: No Slough/Fibrino Yes Wound Bed Granulation Amount: None Present (0%) Exposed Structure Necrotic Amount: Large (67-100%) Fascia Exposed: No Necrotic Quality: Eschar, Adherent Slough Fat Layer (Subcutaneous Tissue) Exposed: No Tendon Exposed: No Muscle Exposed: No Joint Exposed: No Bone Exposed: No Treatment Notes Wound #5 (Left, Lateral Lower Leg) 1. Cleanse With Wound Cleanser Soap and water 2. Periwound Care Moisturizing lotion TCA  Cream 3. Primary Dressing Applied Calcium Alginate Ag 4. Secondary Dressing ABD Pad 6. Support Layer Applied 3 layer compression wrap Notes netting. Electronic Signature(s) Signed: 04/30/2020 1:07:52 PM By: [name] RN, BSN Signed: 04/30/2020 1:21:06 PM By: Deon Pilling Entered By: Deon Pilling on 04/30/2020 09:18:50 -------------------------------------------------------------------------------- Vitals Details Patient Name: Date of Service: [name], [place]. 04/30/2020 8:45 A M Medical Record Number: [id number] Patient Account Number: [id number] Date of Birth/Sex: Treating RN: [birth year] (86 y.o. [name] Primary Care Treasure Ochs: Lajean Manes T Other Clinician: Referring Nely Dedmon: Treating Adnan Vanvoorhis/Extender: Fredderick Erb, Hal T Weeks in Treatment: 18 Vital Signs Time Taken: 08:50 Temperature (F): 98.3 Height (in): 69 Pulse (bpm): 101 Weight (lbs): 163 Respiratory Rate (breaths/min): 20 Body Mass Index (BMI): 24.1 Blood Pressure (mmHg): 158/91 Reference Range: 80 - 120 mg / dl Electronic Signature(s) Signed: 04/30/2020 1:21:06 PM By: Deon Pilling Entered By: Deon Pilling on 04/30/2020 09:18:18

## 2020-05-01 ENCOUNTER — Encounter (HOSPITAL_BASED_OUTPATIENT_CLINIC_OR_DEPARTMENT_OTHER): Payer: Medicare Other | Admitting: Internal Medicine

## 2020-05-05 NOTE — Progress Notes (Signed)
IDARA, WOODSIDE ([id number]) Visit Report for 04/10/2020 Arrival Information Details Patient Name: Date of Service: [place], [name] 04/10/2020 10:45 A M Medical Record Number: [id number] Patient Account Number: [id number] Date of Birth/Sex: Treating RN: 16-Apr-1933 (86 y.o. [name] Primary Care Provider: Lajean Manes T Other Clinician: Referring Provider: Treating Provider/Extender: Evelena Peat in Treatment: 16 Visit Information History Since Last Visit Added or deleted any medications: No Patient Arrived: Ambulatory Any new allergies or adverse reactions: No Arrival Time: 10:51 Had a fall or experienced change in No Accompanied By: self activities of daily living that may affect Transfer Assistance: None risk of falls: Patient Identification Verified: Yes Signs or symptoms of abuse/neglect since last visito No Secondary Verification Process Completed: Yes Hospitalized since last visit: No Patient Requires Transmission-Based Precautions: No Implantable device outside of the clinic excluding No Patient Has Alerts: Yes cellular tissue based products placed in the center Patient Alerts: Patient on Blood Thinner since last visit: Right ABI:0.99 Has Dressing in Place as Prescribed: Yes Pain Present Now: No Electronic Signature(s) Signed: 04/23/2020 9:09:16 AM By: Sandre Kitty Entered By: Sandre Kitty on 04/10/2020 10:51:29 -------------------------------------------------------------------------------- Compression Therapy Details Patient Name: Date of Service: [name] 04/10/2020 10:45 A M Medical Record Number: [id number] Patient Account Number: [id number] Date of Birth/Sex: Treating RN: [birth year] (86 y.o. [name] Primary Care Provider: Patrina Levering Other Clinician: Referring Provider: Treating Provider/Extender: Evelena Peat in Treatment: 16 Compression Therapy Performed for Wound  Assessment: Wound #5 Left,Lateral Lower Leg Performed By: Clinician [name], RN Compression Type: Three Layer Pre Treatment ABI: 1.1 Post Procedure Diagnosis Same as Pre-procedure Electronic Signature(s) Signed: 04/10/2020 5:33:37 PM By: Deon Pilling Entered By: Deon Pilling on 04/10/2020 11:20:54 -------------------------------------------------------------------------------- Encounter Discharge Information Details Patient Name: Date of Service: [name] 04/10/2020 10:45 A M Medical Record Number: [id number] Patient Account Number: [id number] Date of Birth/Sex: Treating RN: Nov 10, 1933 (86 y.o. [name] Primary Care Provider: Patrina Levering Other Clinician: Referring Provider: Treating Provider/Extender: Evelena Peat in Treatment: 16 Encounter Discharge Information Items Post Procedure Vitals Discharge Condition: Stable Temperature (F): 97.7 Ambulatory Status: Ambulatory Pulse (bpm): 68 Discharge Destination: Home Respiratory Rate (breaths/min): 18 Transportation: Private Auto Blood Pressure (mmHg): 132/69 Accompanied By: alone Schedule Follow-up Appointment: Yes Clinical Summary of Care: Patient Declined Electronic Signature(s) Signed: 04/10/2020 5:31:56 PM By: [name] RN, BSN Entered By: [name] on 04/10/2020 11:54:26 -------------------------------------------------------------------------------- Lower Extremity Assessment Details Patient Name: Date of Service: [name] 04/10/2020 10:45 A M Medical Record Number: [id number] Patient Account Number: [id number] Date of Birth/Sex: Treating RN: 30-Nov-1933 (86 y.o. [name] Primary Care Provider: Lajean Manes T Other Clinician: Referring Provider: Treating Provider/Extender: [name] in Treatment: 16 Edema Assessment Assessed: [Left: No] [Right: No] Edema: [Left: No] [Right: No] Calf Left: Right: Point  of Measurement: 42 cm From Medial Instep 31 cm 31.5 cm Ankle Left: Right: Point of Measurement: 13 cm From Medial Instep 19 cm 20 cm Vascular Assessment Pulses: Dorsalis Pedis Palpable: [Left:Yes] [Right:Yes] Electronic Signature(s) Signed: 04/23/2020 9:09:16 AM By: Sandre Kitty Signed: 05/05/2020 1:30:32 PM By: Deon Pilling Entered By: Sandre Kitty on 04/10/2020 11:07:37 -------------------------------------------------------------------------------- Multi Wound Chart Details Patient Name: Date of Service: [name] 04/10/2020 10:45 A M Medical Record Number: [id number] Patient Account Number: [id number] Date of Birth/Sex: Treating RN: [birth year] (86 y.o. [name] Primary Care Provider: Lajean Manes  T Other Clinician: Referring Provider: Treating Provider/Extender: Venita Lick T Weeks in Treatment: 16 Vital Signs Height(in): 69 Pulse(bpm): 68 Weight(lbs): 163 Blood Pressure(mmHg): 132/69 Body Mass Index(BMI): 24 Temperature(F): 97.7 Respiratory Rate(breaths/min): 18 Photos: [4:No Photos Left, Medial Foot] [5:No Photos Left, Lateral Lower Leg] [N/A:N/A N/A] Wound Location: [4:Shear/Friction] [5:Gradually Appeared] [N/A:N/A] Wounding Event: [4:Pressure Ulcer] [5:Venous Leg Ulcer] [N/A:N/A] Primary Etiology: [4:Cataracts, Anemia, Arrhythmia,] [5:Cataracts, Anemia, Arrhythmia,] [N/A:N/A] Comorbid History: [4:Congestive Heart Failure, Hypertension, Peripheral Venous Disease, Received Radiation 01/19/2020] [5:Congestive Heart Failure, Hypertension, Peripheral Venous Disease, Received Radiation 03/21/2020] [N/A:N/A] Date Acquired: [4:10] [5:2] [N/A:N/A] Weeks of Treatment: [4:Open] [5:Open] [N/A:N/A] Wound Status: [4:0.6x1.1x0.1] [5:1.5x1.2x0.2] [N/A:N/A] Measurements L x W x D (cm) [4:0.518] [5:1.414] [N/A:N/A] A (cm) : rea [4:0.052] [5:0.283] [N/A:N/A] Volume (cm) : [4:-722.20%] [5:-181.10%] [N/A:N/A] % Reduction in A [4:rea:  -766.70%] [5:-466.00%] [N/A:N/A] % Reduction in Volume: [4:Category/Stage II] [5:Unclassifiable] [N/A:N/A] Classification: [4:Small] [5:Small] [N/A:N/A] Exudate A mount: [4:Serous] [5:Serosanguineous] [N/A:N/A] Exudate Type: [4:amber] [5:red, brown] [N/A:N/A] Exudate Color: [4:Flat and Intact] [5:Flat and Intact] [N/A:N/A] Wound Margin: [4:Small (1-33%)] [5:None Present (0%)] [N/A:N/A] Granulation A mount: [4:Pale] [5:N/A] [N/A:N/A] Granulation Quality: [4:Large (67-100%)] [5:Large (67-100%)] [N/A:N/A] Necrotic A mount: [4:Adherent Slough] [5:Eschar, Adherent Slough] [N/A:N/A] Necrotic Tissue: [4:Fat Layer (Subcutaneous Tissue)] [5:Fascia: No] [N/A:N/A] Exposed Structures: [4:Exposed: Yes Fascia: No Tendon: No Muscle: No Joint: No Bone: No Large (67-100%)] [5:Fat Layer (Subcutaneous Tissue) Exposed: No Tendon: No Muscle: No Joint: No Bone: No None] [N/A:N/A] Epithelialization: [4:N/A] [5:Debridement - Excisional] [N/A:N/A] Debridement: Pre-procedure Verification/Time Out N/A [5:11:15] [N/A:N/A] Taken: [4:N/A] [5:Lidocaine 4% Topical Solution] [N/A:N/A] Pain Control: [4:N/A] [5:Necrotic/Eschar, Subcutaneous,] [N/A:N/A] Tissue Debrided: [4:N/A] [5:Slough Skin/Subcutaneous Tissue] [N/A:N/A] Level: [4:N/A] [5:1.8] [N/A:N/A] Debridement A (sq cm): [4:rea N/A] [5:Curette] [N/A:N/A] Instrument: [4:N/A] [5:Minimum] [N/A:N/A] Bleeding: [4:N/A] [5:Pressure] [N/A:N/A] Hemostasis Achieved: [4:N/A] [5:0] [N/A:N/A] Procedural Pain: [4:N/A] [5:0] [N/A:N/A] Post Procedural Pain: [4:N/A] [5:Procedure was tolerated well] [N/A:N/A] Debridement Treatment Response: [4:N/A] [5:1.5x1.2x0.3] [N/A:N/A] Post Debridement Measurements L x W x D (cm) [4:N/A] [5:0.424] [N/A:N/A] Post Debridement Volume: (cm) [4:N/A] [5:Compression [place] [N/A:N/A] Procedures Performed: [5:Debridement] Treatment Notes Wound #4 (Left, Medial Foot) 1. Cleanse With Soap and water 2. Periwound Care Moisturizing lotion TCA  Cream 3. Primary Dressing Applied Foam 6. Support Layer Applied 3 layer compression wrap Wound #5 (Left, Lateral Lower Leg) 1. Cleanse With Soap and water 2. Periwound Care Moisturizing lotion TCA Cream 3. Primary Dressing Applied Other primary dressing (specifiy in notes) 4. Secondary Dressing Dry Gauze 6. Support Layer Applied 3 layer compression wrap Notes primary dressing cutimed Wellsite geologist) Signed: 04/10/2020 5:41:26 PM By: Linton Ham MD Signed: 05/05/2020 1:30:32 PM By: Deon Pilling Entered By: Linton Ham on 04/10/2020 12:16:01 -------------------------------------------------------------------------------- Multi-Disciplinary Care Plan Details Patient Name: Date of Service: [place], [place]. 04/10/2020 10:45 A M Medical Record Number: [id number] Patient Account Number: [id number] Date of Birth/Sex: Treating RN: 04/24/1933 (86 y.o. [name] Primary Care Provider: Patrina Levering Other Clinician: Referring Provider: Treating Provider/Extender: Evelena Peat in Treatment: 16 Active Inactive Abuse / Safety / Falls / Self Care Management Nursing Diagnoses: Potential for falls Goals: Patient/caregiver will verbalize understanding of skin care regimen Date Initiated: 12/20/2019 Date Inactivated: 02/14/2020 Target Resolution Date: 02/22/2020 Goal Status: Met Patient/caregiver will verbalize/demonstrate understanding of what to do in case of emergency Date Initiated: 12/20/2019 Target Resolution Date: 04/25/2020 Goal Status: Active Interventions: Assess fall risk on admission and as needed Provide education on fall prevention Notes: Pain, Acute or Chronic Nursing Diagnoses: Pain, acute or chronic: actual or potential  Potential alteration in comfort, pain Goals: Patient will verbalize adequate pain control and receive pain control interventions during procedures as needed Date Initiated: 12/20/2019 Date  Inactivated: 02/14/2020 Target Resolution Date: 02/22/2020 Goal Status: Met Patient/caregiver will verbalize comfort level met Date Initiated: 12/20/2019 Target Resolution Date: 04/25/2020 Goal Status: Active Interventions: Complete pain assessment as per visit requirements Provide education on pain management Notes: Wound/Skin Impairment Nursing Diagnoses: Knowledge deficit related to ulceration/compromised skin integrity Goals: Patient/caregiver will verbalize understanding of skin care regimen Date Initiated: 12/20/2019 Target Resolution Date: 04/25/2020 Goal Status: Active Interventions: Assess patient/caregiver ability to perform ulcer/skin care regimen upon admission and as needed Provide education on ulcer and skin care Treatment Activities: Skin care regimen initiated : 12/20/2019 Topical wound management initiated : 12/20/2019 Notes: Electronic Signature(s) Signed: 04/10/2020 5:33:37 PM By: Deon Pilling Signed: 05/05/2020 1:30:32 PM By: Deon Pilling Entered By: Deon Pilling on 04/10/2020 10:51:08 -------------------------------------------------------------------------------- Pain Assessment Details Patient Name: Date of Service: [name] 04/10/2020 10:45 A M Medical Record Number: [id number] Patient Account Number: [id number] Date of Birth/Sex: Treating RN: [birth year] (86 y.o. [name] Primary Care Provider: Patrina Levering Other Clinician: Referring Provider: Treating Provider/Extender: [name] in Treatment: 16 Active Problems Location of Pain Severity and Description of Pain Patient Has Paino No Site Locations Pain Management and Medication Current Pain Management: Electronic Signature(s) Signed: 04/23/2020 9:09:16 AM By: Sandre Kitty Signed: 05/05/2020 1:30:32 PM By: Deon Pilling Entered By: Sandre Kitty on 04/10/2020  10:53:10 -------------------------------------------------------------------------------- Patient/Caregiver Education Details Patient Name: Date of Service: [name] 4/29/2021andnbsp10:45 A M Medical Record Number: [id number] Patient Account Number: [id number] Date of Birth/Gender: Treating RN: [birth year] (86 y.o. [name] Primary Care Physician: Lajean Manes T Other Clinician: Referring Physician: Treating Physician/Extender: Evelena Peat in Treatment: 16 Education Assessment Education Provided To: Patient Education Topics Provided Pain: Handouts: A Guide to Pain Control Methods: Explain/Verbal Responses: Reinforcements needed Electronic Signature(s) Signed: 04/10/2020 5:33:37 PM By: Deon Pilling Entered By: Deon Pilling on 04/10/2020 10:51:21 -------------------------------------------------------------------------------- Wound Assessment Details Patient Name: Date of Service: MACK, THURMON 04/10/2020 10:45 A M Medical Record Number: [id number] Patient Account Number: [id number] Date of Birth/Sex: Treating RN: 05-17-1933 (86 y.o. [name] Primary Care Provider: Lajean Manes T Other Clinician: Referring Provider: Treating Provider/Extender: [name] in Treatment: 16 Wound Status Wound Number: 4 Primary Pressure Ulcer Etiology: Wound Location: Left, Medial Foot Wound Open Wounding Event: Shear/Friction Status: Date Acquired: 01/19/2020 Comorbid Cataracts, Anemia, Arrhythmia, Congestive Heart Failure, Weeks Of Treatment: 10 History: Hypertension, Peripheral Venous Disease, Received Radiation Clustered Wound: No Photos Photo Uploaded By: Mikeal Hawthorne on 04/11/2020 14:31:26 Wound Measurements Length: (cm) 0.6 Width: (cm) 1.1 Depth: (cm) 0.1 Area: (cm) 0.518 Volume: (cm) 0.052 % Reduction in Area: -722.2% % Reduction in Volume: -766.7% Epithelialization: Large  (67-100%) Tunneling: No Undermining: No Wound Description Classification: Category/Stage II Wound Margin: Flat and Intact Exudate Amount: Small Exudate Type: Serous Exudate Color: amber Foul Odor After Cleansing: No Slough/Fibrino No Wound Bed Granulation Amount: Small (1-33%) Exposed Structure Granulation Quality: Pale Fascia Exposed: No Necrotic Amount: Large (67-100%) Fat Layer (Subcutaneous Tissue) Exposed: Yes Necrotic Quality: Adherent Slough Tendon Exposed: No Muscle Exposed: No Joint Exposed: No Bone Exposed: No Electronic Signature(s) Signed: 04/10/2020 5:31:56 PM By: [name] RN, BSN Entered By: [name] on 04/10/2020 11:08:37 -------------------------------------------------------------------------------- Wound Assessment Details Patient Name: Date of Service: [name] 04/10/2020 10:45 A M Medical Record Number:  [id number] Patient Account Number: [id number] Date of Birth/Sex: Treating RN: 02/07/1933 (86 y.o. [name] Primary Care Provider: Lajean Manes T Other Clinician: Referring Provider: Treating Provider/Extender: [name] in Treatment: 16 Wound Status Wound Number: 5 Primary Venous Leg Ulcer Etiology: Etiology: Wound Location: Left, Lateral Lower Leg Wound Open Wounding Event: Gradually Appeared Status: Date Acquired: 03/21/2020 Comorbid Cataracts, Anemia, Arrhythmia, Congestive Heart Failure, Weeks Of Treatment: 2 History: Hypertension, Peripheral Venous Disease, Received Radiation Clustered Wound: No Photos Photo Uploaded By: Mikeal Hawthorne on 04/11/2020 14:31:27 Wound Measurements Length: (cm) 1.5 Width: (cm) 1.2 Depth: (cm) 0.2 Area: (cm) 1.414 Volume: (cm) 0.283 % Reduction in Area: -181.1% % Reduction in Volume: -466% Epithelialization: None Tunneling: No Undermining: No Wound Description Classification: Unclassifiable Wound Margin: Flat and Intact Exudate Amount:  Small Exudate Type: Serosanguineous Exudate Color: red, brown Foul Odor After Cleansing: No Slough/Fibrino Yes Wound Bed Granulation Amount: None Present (0%) Exposed Structure Necrotic Amount: Large (67-100%) Fascia Exposed: No Necrotic Quality: Eschar, Adherent Slough Fat Layer (Subcutaneous Tissue) Exposed: No Tendon Exposed: No Muscle Exposed: No Joint Exposed: No Bone Exposed: No Electronic Signature(s) Signed: 04/10/2020 5:31:56 PM By: [name] RN, BSN Entered By: [name] on 04/10/2020 11:08:47 -------------------------------------------------------------------------------- [place] Details Patient Name: Date of Service: [name] 04/10/2020 10:45 A M Medical Record Number: [id number] Patient Account Number: [id number] Date of Birth/Sex: Treating RN: [birth year] (86 y.o. [name] Primary Care Provider: Lajean Manes T Other Clinician: Referring Provider: Treating Provider/Extender: [name] in Treatment: 16 Vital Signs Time Taken: 10:52 Temperature (F): 97.7 Height (in): 69 Pulse (bpm): 68 Weight (lbs): 163 Respiratory Rate (breaths/min): 18 Body Mass Index (BMI): 24.1 Blood Pressure (mmHg): 132/69 Reference Range: 80 - 120 mg / dl Electronic Signature(s) Signed: 04/23/2020 9:09:16 AM By: Sandre Kitty Entered By: Sandre Kitty on 04/10/2020 10:53:06

## 2020-05-05 NOTE — Progress Notes (Signed)
[name], [name] ([id number]) Visit Report for 04/10/2020 Debridement Details Patient Name: Date of Service: [place], [name] 04/10/2020 10:45 A M Medical Record Number: [id number] Patient Account Number: [id number] Date of Birth/Sex: Treating RN: 1932/12/22 (86 y.o. [name], [name] Primary Care Provider: Lajean Green T Other Clinician: Referring Provider: Treating Provider/Extender: [name] in Treatment: 16 Debridement Performed for Assessment: Wound #5 Left,Lateral Lower Leg Performed By: Physician [name]., MD Debridement Type: Debridement Severity of Tissue Pre Debridement: Fat layer exposed Level of Consciousness (Pre-procedure): Awake and Alert Pre-procedure Verification/Time Out Yes - 11:15 Taken: Start Time: 11:16 Pain Control: Lidocaine 4% T opical Solution T Area Debrided (L x W): otal 1.5 (cm) x 1.2 (cm) = 1.8 (cm) Tissue and other material debrided: Viable, Non-Viable, Eschar, Slough, Subcutaneous, Skin: Dermis , Fibrin/Exudate, Slough Level: Skin/Subcutaneous Tissue Debridement Description: Excisional Instrument: Curette Bleeding: Minimum Hemostasis Achieved: Pressure End Time: 11:19 Procedural Pain: 0 Post Procedural Pain: 0 Response to Treatment: Procedure was tolerated well Level of Consciousness (Post- Awake and Alert procedure): Post Debridement Measurements of Total Wound Length: (cm) 1.5 Width: (cm) 1.2 Depth: (cm) 0.3 Volume: (cm) 0.424 Character of Wound/Ulcer Post Debridement: Requires Further Debridement Severity of Tissue Post Debridement: Fat layer exposed Post Procedure Diagnosis Same as Pre-procedure Electronic Signature(s) Signed: 04/10/2020 5:41:26 PM By: Tracy Ham MD Signed: 05/05/2020 1:30:32 PM By: [name] Entered By: [name] on 04/10/2020 12:16:28 -------------------------------------------------------------------------------- HPI Details Patient Name: Date of  Service: [name]. 04/10/2020 10:45 A M Medical Record Number: [id number] Patient Account Number: [id number] Date of Birth/Sex: Treating RN: 05/30/1933 (86 y.o. [name] Primary Care Provider: Patrina Green Other Clinician: Referring Provider: Treating Provider/Extender: [name] in Treatment: 16 History of Present Illness HPI Description: ADMISSION 12/20/2019 This is an 86 year old woman referred by her primary physician Dr. Felipa Green for review of a wound on her right lateral lower leg. She was actually in this clinic on 2 separate occasions in 2010 and 2012 cared for by Dr. Sherilyn Green. At that point in time she had wounds on her right leg as well. She tells [place] to 1 month ago she noticed a scab building up on her right lateral lower leg this opened into a wound. There was no overt cause of this no trauma, no infection that she is aware of. She has a history of chronic venous insufficiency and wears compression stockings fairly religiously indeed she is done well over the last 8 years since she was last in this clinic. She has been applying Vaseline on this and a covering phone. This is not progressing towards healing. Past medical history; interstitial lung disease, chronic atrial fibrillation status post pacemaker, osteoarthritis of the left knee, left breast CA, chronic repeat venous insufficiency. She takes Eliquis for her atrial fibrillation stroke prophylaxis. ABI in our clinic was 0.99 on the right 1/15; superficial wound on the right lateral calf in the setting of severe acute skin changes from chronic venous insufficiency and lymphedema. We used Iodoflex last week she complained of a lot of pain 1/22; this is a small but difficult wound on the right lateral calf in the setting of severe chronic venous insufficiency and secondary lymphedema. She continues to state the wounds things hurts when she is up on it but seems to be relieved by putting  her leg up. I changed her to [name] last week because the Iodoflex seem to be causing stinging. 1/29. This wound appears  to be contracting somewhat. Changes of chronic venous insufficiency with secondary lymphedema 2/12; not as good as surface today and slightly bigger. I had to change her from Iodoflex to [name] because of the stinging pain although she does not think it was any better on the [name]. She comes into clinic today with a area on the medial left great toe. She said she noticed blood on her sock last Saturday. She had some form of bunion surgery by Dr. Ila Green her podiatrist sometime in 2019 she said he "shave the area". I could not find his operative report although I did see reference to the bunion in the area. 2/19; somewhat improved wound on the right lateral lower leg however the wound she came in on the bunion of her left great toe is actually I think larger still somewhat inflamed. We have been using Iodoflex 2/26; not much improvement in either wound area which is the original venous wound on the right lateral lower leg and in the area on the bunion of her left great toe. This still looks somewhat inflamed and tender. We have been using Iodoflex with open much improvement. 3/4; in general both her wounds look better this includes the original venous insufficiency wound on the right lateral lower leg and the area on her tip of her bunion on the left great toe medial aspect of the MTP. Change to Hydrofera Blue last week 3/12; the patient still has a small geographic shaped wound on the right lateral lower leg. We have been using Hydrofera Blue but I changed her to endoform today. She also has the area in the tip of the bunion of the left great toe. We will try Hydrofera Blue here as well 3/19; the small geographic wound on the right lateral lower leg has not filled in. There is some surrounding induration we have noticed this previously. This may be  venous inflammation but I wonder about biopsying this if this is not closing up. The area over the left medial first MTP [bunion deformity] requires debridement. This is not closing in. I am not convinced she is offloading adequately 3/26; right lateral lower leg in the setting of severe venous insufficiency and also an area over the first MTP bunion deformity. No debridement is required. We have been using endoform 4/9; right lateral lower leg wound in the setting of severe venous insufficiency and also a refractory area over the first MTP bunion deformity. The area on the right lateral lower leg is just about closed there is still a minor open area here. She comes in today with a mirror image area on the left lateral lower leg. She says this started as a scab 2 weeks ago and is gradually morphed into an open wound very similar to what she has on the right leg. She has severe bilateral venous insufficiency with venous hypertension obvious from clinical exam. 4/16; her original wound the right lateral leg wound in the setting of chronic venous hypertension is a small open area but very small. We have been using endoform. Her wound over the left first MTP bunion deformity also appears to be small and closing in. Unfortunately she has a large relative area on the left lateral calf which was new last week. Very adherent debris over this we have been using Iodoflex however that may be contributing to the debris on the wound surface 4/23; the original wound is closed and the area on the left first metatarsal bunion deformity is also  almost closed the new area from last week required debridement. She went for her reflux studies that did not take her lower extremity wraps off. This is not helpful. She did have significant reflux in the right common femoral vein. I am not going to press this issue further. She clinically has severe venous hypertension 4/29; the original wound is closed on the right lateral  lower calf. The area on the left first metatarsal/bunion deformity has a small slitlike opening that is still not closed. The real problem here is now wound on the left lateral calf which is necrotic and deep. We used Iodoflex on this wound last time. Silver alginate on the left first toe The patient has Farrow wraps. We should be able to transition the right leg into compression stockings and were doing this today. Electronic Signature(s) Signed: 04/10/2020 5:41:26 PM By: Tracy Ham MD Entered By: [name] on 04/10/2020 12:19:13 -------------------------------------------------------------------------------- Physical Exam Details Patient Name: Date of Service: [place], [name] 04/10/2020 10:45 A M Medical Record Number: [id number] Patient Account Number: [id number] Date of Birth/Sex: Treating RN: [birth year] (86 y.o. [name] Primary Care Provider: Patrina Green Other Clinician: Referring Provider: Treating Provider/Extender: [name] in Treatment: 16 Constitutional Sitting or standing Blood Pressure is within target range for patient.. Pulse regular and within target range for patient.Marland Kitchen Respirations regular, non-labored and within target range.. Temperature is normal and within the target range for the patient.Marland Kitchen Appears in no distress. Notes Wound exam; the areas we have been following on the right lateral leg is closed The area of the bunion deformity is still open with a small slitlike deformity The wound on the left lateral calf was completely necrotic today. Debrided with a #3 curette. This was done with difficulty. The wound had a significant amount of relative depth and even after debridement I had trouble getting to a viable surface. Electronic Signature(s) Signed: 04/10/2020 5:41:26 PM By: Tracy Ham MD Entered By: [name] on 04/10/2020  12:22:02 -------------------------------------------------------------------------------- Physician Orders Details Patient Name: Date of Service: [name]. 04/10/2020 10:45 A M Medical Record Number: [id number] Patient Account Number: [id number] Date of Birth/Sex: Treating RN: [birth year] (86 y.o. [name] Primary Care Provider: Lajean Green T Other Clinician: Referring Provider: Treating Provider/Extender: [name] in Treatment: 19 Verbal / Phone Orders: No Diagnosis Coding ICD-10 Coding Code Description I87.321 Chronic venous hypertension (idiopathic) with inflammation of right lower extremity L97.812 Non-pressure chronic ulcer of other part of right lower leg with fat layer exposed L97.828 Non-pressure chronic ulcer of other part of left lower leg with other specified severity I87.322 Chronic venous hypertension (idiopathic) with inflammation of left lower extremity L97.522 Non-pressure chronic ulcer of other part of left foot with fat layer exposed Follow-up Appointments ppointment in 1 week. - Friday Return A Dressing Change Frequency Wound #4 Left,Medial Foot Change Dressing every other day. - only to left medial foot wound Wound #5 Left,Lateral Lower Leg Do not change entire dressing for one week. Skin Barriers/Peri-Wound Care Moisturizing lotion - patient to lotion right leg every night. TCA Cream or Ointment Wound Cleansing May shower with protection. - cast protector Primary Wound Dressing Wound #4 Left,Medial Foot Other: - foam to left medial foot. Wound #5 Left,Lateral Lower Leg Cutimed Sorbact - moisten with hydrogel Secondary Dressing Wound #5 Left,Lateral Lower Leg Dry Gauze Edema Control 3 Layer Compression System - Left Lower Extremity Avoid standing for long periods of time Elevate  legs to the level of the heart or above for 30 minutes daily and/or when sitting, a frequency of: - throughout the day. Exercise  regularly Support Garment 20-30 mm/Hg pressure to: - patient to apply farrow wrap 4000 to right leg. Apply in the morning and remove at night. Off-Loading Open toe surgical Green to: - left Electronic Signature(s) Signed: 04/10/2020 5:33:37 PM By: [name] Signed: 04/10/2020 5:41:26 PM By: Tracy Ham MD Entered By: [name] on 04/10/2020 11:20:21 -------------------------------------------------------------------------------- Problem List Details Patient Name: Date of Service: [name]. 04/10/2020 10:45 A M Medical Record Number: [id number] Patient Account Number: [id number] Date of Birth/Sex: Treating RN: 11-09-1933 (86 y.o. [name] Primary Care Provider: Lajean Green T Other Clinician: Referring Provider: Treating Provider/Extender: [name] in Treatment: 16 Active Problems ICD-10 Encounter Code Description Active Date MDM Diagnosis I87.321 Chronic venous hypertension (idiopathic) with inflammation of right lower 12/20/2019 No Yes extremity L97.828 Non-pressure chronic ulcer of other part of left lower leg with other specified 03/21/2020 No Yes severity I87.322 Chronic venous hypertension (idiopathic) with inflammation of left lower 02/14/2020 No Yes extremity L97.522 Non-pressure chronic ulcer of other part of left foot with fat layer exposed 01/25/2020 No Yes Inactive Problems ICD-10 Code Description Active Date Inactive Date L97.812 Non-pressure chronic ulcer of other part of right lower leg with fat layer exposed 12/20/2019 12/20/2019 Resolved Problems Electronic Signature(s) Signed: 04/10/2020 5:41:26 PM By: Tracy Ham MD Entered By: [name] on 04/10/2020 12:13:28 -------------------------------------------------------------------------------- Progress Note Details Patient Name: Date of Service: [name]. 04/10/2020 10:45 A M Medical Record Number: [id number] Patient Account Number: [id number] Date  of Birth/Sex: Treating RN: 1933-01-07 (86 y.o. [name] Primary Care Provider: Patrina Green Other Clinician: Referring Provider: Treating Provider/Extender: [name] in Treatment: 16 Subjective History of Present Illness (HPI) ADMISSION 12/20/2019 This is an 86 year old woman referred by her primary physician Dr. Felipa Green for review of a wound on her right lateral lower leg. She was actually in this clinic on 2 separate occasions in 2010 and 2012 cared for by Dr. Sherilyn Green. At that point in time she had wounds on her right leg as well. She tells [place] to 1 month ago she noticed a scab building up on her right lateral lower leg this opened into a wound. There was no overt cause of this no trauma, no infection that she is aware of. She has a history of chronic venous insufficiency and wears compression stockings fairly religiously indeed she is done well over the last 8 years since she was last in this clinic. She has been applying Vaseline on this and a covering phone. This is not progressing towards healing. Past medical history; interstitial lung disease, chronic atrial fibrillation status post pacemaker, osteoarthritis of the left knee, left breast CA, chronic repeat venous insufficiency. She takes Eliquis for her atrial fibrillation stroke prophylaxis. ABI in our clinic was 0.99 on the right 1/15; superficial wound on the right lateral calf in the setting of severe acute skin changes from chronic venous insufficiency and lymphedema. We used Iodoflex last week she complained of a lot of pain 1/22; this is a small but difficult wound on the right lateral calf in the setting of severe chronic venous insufficiency and secondary lymphedema. She continues to state the wounds things hurts when she is up on it but seems to be relieved by putting her leg up. I changed her to [facility] last week because  the Iodoflex seem to be causing stinging. 1/29. This  wound appears to be contracting somewhat. Changes of chronic venous insufficiency with secondary lymphedema 2/12; not as good as surface today and slightly bigger. I had to change her from Iodoflex to [facility] because of the stinging pain although she does not think it was any better on the [facility]. ooShe comes into clinic today with a area on the medial left great toe. She said she noticed blood on her sock last Saturday. She had some form of bunion surgery by Dr. Ila Green her podiatrist sometime in 2019 she said he "shave the area". I could not find his operative report although I did see reference to the bunion in the area. 2/19; somewhat improved wound on the right lateral lower leg however the wound she came in on the bunion of her left great toe is actually I think larger still somewhat inflamed. We have been using Iodoflex 2/26; not much improvement in either wound area which is the original venous wound on the right lateral lower leg and in the area on the bunion of her left great toe. This still looks somewhat inflamed and tender. We have been using Iodoflex with open much improvement. 3/4; in general both her wounds look better this includes the original venous insufficiency wound on the right lateral lower leg and the area on her tip of her bunion on the left great toe medial aspect of the MTP. Change to Hydrofera Blue last week 3/12; the patient still has a small geographic shaped wound on the right lateral lower leg. We have been using Hydrofera Blue but I changed her to endoform today. She also has the area in the tip of the bunion of the left great toe. We will try Hydrofera Blue here as well 3/19; the small geographic wound on the right lateral lower leg has not filled in. There is some surrounding induration we have noticed this previously. This may be venous inflammation but I wonder about biopsying this if this is not closing up. The area over the left medial  first MTP [bunion deformity] requires debridement. This is not closing in. I am not convinced she is offloading adequately 3/26; right lateral lower leg in the setting of severe venous insufficiency and also an area over the first MTP bunion deformity. No debridement is required. We have been using endoform 4/9; right lateral lower leg wound in the setting of severe venous insufficiency and also a refractory area over the first MTP bunion deformity. The area on the right lateral lower leg is just about closed there is still a minor open area here. She comes in today with a mirror image area on the left lateral lower leg. She says this started as a scab 2 weeks ago and is gradually morphed into an open wound very similar to what she has on the right leg. She has severe bilateral venous insufficiency with venous hypertension obvious from clinical exam. 4/16; her original wound the right lateral leg wound in the setting of chronic venous hypertension is a small open area but very small. We have been using endoform. Her wound over the left first MTP bunion deformity also appears to be small and closing in. Unfortunately she has a large relative area on the left lateral calf which was new last week. Very adherent debris over this we have been using Iodoflex however that may be contributing to the debris on the wound surface 4/23; the original wound is closed  and the area on the left first metatarsal bunion deformity is also almost closed the new area from last week required debridement. She went for her reflux studies that did not take her lower extremity wraps off. This is not helpful. She did have significant reflux in the right common femoral vein. I am not going to press this issue further. She clinically has severe venous hypertension 4/29; the original wound is closed on the right lateral lower calf. The area on the left first metatarsal/bunion deformity has a small slitlike opening that is still  not closed. The real problem here is now wound on the left lateral calf which is necrotic and deep. We used Iodoflex on this wound last time. Silver alginate on the left first toe The patient has Farrow wraps. We should be able to transition the right leg into compression stockings and were doing this today. Objective Constitutional Sitting or standing Blood Pressure is within target range for patient.. Pulse regular and within target range for patient.Marland Kitchen Respirations regular, non-labored and within target range.. Temperature is normal and within the target range for the patient.Marland Kitchen Appears in no distress. Vitals Time Taken: 10:52 AM, Height: 69 in, Weight: 163 lbs, BMI: 24.1, Temperature: 97.7 F, Pulse: 68 bpm, Respiratory Rate: 18 breaths/min, Blood Pressure: 132/69 mmHg. General Notes: Wound exam; the areas we have been following on the right lateral leg is closed ooThe area of the bunion deformity is still open with a small slitlike deformity ooThe wound on the left lateral calf was completely necrotic today. Debrided with a #3 curette. This was done with difficulty. The wound had a significant amount of relative depth and even after debridement I had trouble getting to a viable surface. Integumentary (Hair, Skin) Wound #4 status is Open. Original cause of wound was Shear/Friction. The wound is located on the Left,Medial Foot. The wound measures 0.6cm length x 1.1cm width x 0.1cm depth; 0.518cm^2 area and 0.052cm^3 volume. There is Fat Layer (Subcutaneous Tissue) Exposed exposed. There is no tunneling or undermining noted. There is a small amount of serous drainage noted. The wound margin is flat and intact. There is small (1-33%) pale granulation within the wound bed. There is a large (67-100%) amount of necrotic tissue within the wound bed including Adherent Slough. Wound #5 status is Open. Original cause of wound was Gradually Appeared. The wound is located on the Left,Lateral Lower Leg.  The wound measures 1.5cm length x 1.2cm width x 0.2cm depth; 1.414cm^2 area and 0.283cm^3 volume. There is no tunneling or undermining noted. There is a small amount of serosanguineous drainage noted. The wound margin is flat and intact. There is no granulation within the wound bed. There is a large (67-100%) amount of necrotic tissue within the wound bed including Eschar and Adherent Slough. Assessment Active Problems ICD-10 Chronic venous hypertension (idiopathic) with inflammation of right lower extremity Non-pressure chronic ulcer of other part of left lower leg with other specified severity Chronic venous hypertension (idiopathic) with inflammation of left lower extremity Non-pressure chronic ulcer of other part of left foot with fat layer exposed Procedures Wound #5 Pre-procedure diagnosis of Wound #5 is a Venous Leg Ulcer located on the Left,Lateral Lower Leg .Severity of Tissue Pre Debridement is: Fat layer exposed. There was a Excisional Skin/Subcutaneous Tissue Debridement with a total area of 1.8 sq cm performed by [name]., MD. With the following instrument(s): Curette to remove Viable and Non-Viable tissue/material. Material removed includes Eschar, Subcutaneous Tissue, Slough, Skin: Dermis, and Fibrin/Exudate after  achieving pain control using Lidocaine 4% Topical Solution. A time out was conducted at 11:15, prior to the start of the procedure. A Minimum amount of bleeding was controlled with Pressure. The procedure was tolerated well with a pain level of 0 throughout and a pain level of 0 following the procedure. Post Debridement Measurements: 1.5cm length x 1.2cm width x 0.3cm depth; 0.424cm^3 volume. Character of Wound/Ulcer Post Debridement requires further debridement. Severity of Tissue Post Debridement is: Fat layer exposed. Post procedure Diagnosis Wound #5: Same as Pre-Procedure Pre-procedure diagnosis of Wound #5 is a Venous Leg Ulcer located on the Left,Lateral  Lower Leg . There was a Three Layer Compression Therapy Procedure with a pre-treatment ABI of 1.1 by [name], RN. Post procedure Diagnosis Wound #5: Same as Pre-Procedure Plan Follow-up Appointments: Return Appointment in 1 week. - Friday Dressing Change Frequency: Wound #4 Left,Medial Foot: Change Dressing every other day. - only to left medial foot wound Wound #5 Left,Lateral Lower Leg: Do not change entire dressing for one week. Skin Barriers/Peri-Wound Care: Moisturizing lotion - patient to lotion right leg every night. TCA Cream or Ointment Wound Cleansing: May shower with protection. - cast protector Primary Wound Dressing: Wound #4 Left,Medial Foot: Other: - foam to left medial foot. Wound #5 Left,Lateral Lower Leg: Cutimed Sorbact - moisten with hydrogel Secondary Dressing: Wound #5 Left,Lateral Lower Leg: Dry Gauze Edema Control: 3 Layer Compression System - Left Lower Extremity Avoid standing for long periods of time Elevate legs to the level of the heart or above for 30 minutes daily and/or when sitting, a frequency of: - throughout the day. Exercise regularly Support Garment 20-30 mm/Hg pressure to: - patient to apply farrow wrap 4000 to right leg. Apply in the morning and remove at night. Off-Loading: Open toe surgical Green to: - left 1. Severe venous hypertension. The exact reason for this is not completely clear but her dilated veins certainly suggest this. 2. She is not exactly sure how the wounds evolved. The area on the right is closed but the new area she is uncertain about. This underwent an aggressive debridement today but I am still not able to get to a healthy surface. We will continue the Iodoflex under compression. A biopsy here is not impossible to envision perhaps a deep tissue culture as well 3. Continue with silver alginate on the left first met head if this does not adhere perhaps Iodoflex or Iodosorb ointment. Continue with 3 layer  compression Electronic Signature(s) Signed: 04/10/2020 5:41:26 PM By: Tracy Ham MD Entered By: [name] on 04/10/2020 12:23:47 -------------------------------------------------------------------------------- SuperBill Details Patient Name: Date of Service: [name] 04/10/2020 Medical Record Number: [id number] Patient Account Number: [id number] Date of Birth/Sex: Treating RN: Nov 29, 1933 (86 y.o. [name], [name] Primary Care Provider: Lajean Green T Other Clinician: Referring Provider: Treating Provider/Extender: [name] in Treatment: 16 Diagnosis Coding ICD-10 Codes Code Description [phone] Chronic venous hypertension (idiopathic) with inflammation of right lower extremity L97.812 Non-pressure chronic ulcer of other part of right lower leg with fat layer exposed L97.828 Non-pressure chronic ulcer of other part of left lower leg with other specified severity I87.322 Chronic venous hypertension (idiopathic) with inflammation of left lower extremity L97.522 Non-pressure chronic ulcer of other part of left foot with fat layer exposed Facility Procedures The patient participates with Medicare or their insurance follows the Medicare Facility Guidelines: CPT4 Code Description Modifier Quantity [id number] - DEB SUBQ TISSUE 20 SQ CM/< 1 ICD-10 Diagnosis Description L97.828 Non-pressure  chronic ulcer of  other part of left lower leg with other specified severity Physician Procedures : CPT4 Code Description Modifier [id number] - WC PHYS SUBQ TISS 20 SQ CM ICD-10 Diagnosis Description L97.828 Non-pressure chronic ulcer of other part of left lower leg with other specified severity Quantity: 1 Electronic Signature(s) Signed: 04/10/2020 5:41:26 PM By: Tracy Ham MD Entered By: [name] on 04/10/2020 12:24:01

## 2020-05-07 NOTE — Progress Notes (Signed)
MCKENLEE, KLASS ([id number]) Visit Report for 04/30/2020 SuperBill Details Patient Name: Date of Service: [place], [name] 04/30/2020 Medical Record Number: [id number] Patient Account Number: [id number] Date of Birth/Sex: Treating RN: 1933-03-03 (86 y.o. [name] Primary Care Provider: Lajean Manes T Other Clinician: Referring Provider: Treating Provider/Extender: Nelda Severe Weeks in Treatment: 18 Diagnosis Coding ICD-10 Codes Code Description [phone] Chronic venous hypertension (idiopathic) with inflammation of right lower extremity L97.828 Non-pressure chronic ulcer of other part of left lower leg with other specified severity I87.322 Chronic venous hypertension (idiopathic) with inflammation of left lower extremity L97.522 Non-pressure chronic ulcer of other part of left foot with fat layer exposed Facility Procedures The patient participates with Medicare or their insurance follows the Medicare Facility Guidelines CPT4 Code Description Modifier Quantity [id number] (Facility Use Only) [phone] - APPLY MULTLAY COMPRS LWR LT LEG 1 Electronic Signature(s) Signed: 04/30/2020 1:21:06 PM By: Deon Pilling Signed: 05/07/2020 12:40:59 PM By: Worthy Keeler PA-C Entered By: Deon Pilling on 04/30/2020 09:20:03

## 2020-05-09 ENCOUNTER — Other Ambulatory Visit: Payer: Self-pay

## 2020-05-09 ENCOUNTER — Encounter (HOSPITAL_BASED_OUTPATIENT_CLINIC_OR_DEPARTMENT_OTHER): Payer: Medicare Other | Admitting: Internal Medicine

## 2020-05-09 DIAGNOSIS — L97522 Non-pressure chronic ulcer of other part of left foot with fat layer exposed: Secondary | ICD-10-CM | POA: Diagnosis not present

## 2020-05-09 NOTE — Progress Notes (Signed)
LAUREY, SALSER ([id number]) Visit Report for 05/09/2020 HPI Details Patient Name: Date of Service: [place], [name] 05/09/2020 9:30 A M Medical Record Number: [id number] Patient Account Number: [id number] Date of Birth/Sex: Treating RN: [birth year] (86 y.o. [name] Primary Care Provider: Patrina Levering Other Clinician: Referring Provider: Treating Provider/Extender: Gifford Shave T Weeks in Treatment: 20 History of Present Illness HPI Description: ADMISSION 12/20/2019 This is an 86 year old woman referred by her primary physician Dr. Felipa Eth for review of a wound on her right lateral lower leg. She was actually in this clinic on 2 separate occasions in 2010 and 2012 cared for by Dr. Sherilyn Cooter. At that point in time she had wounds on her right leg as well. She tells [place] to 1 month ago she noticed a scab building up on her right lateral lower leg this opened into a wound. There was no overt cause of this no trauma, no infection that she is aware of. She has a history of chronic venous insufficiency and wears compression stockings fairly religiously indeed she is done well over the last 8 years since she was last in this clinic. She has been applying Vaseline on this and a covering phone. This is not progressing towards healing. Past medical history; interstitial lung disease, chronic atrial fibrillation status post pacemaker, osteoarthritis of the left knee, left breast CA, chronic repeat venous insufficiency. She takes Eliquis for her atrial fibrillation stroke prophylaxis. ABI in our clinic was 0.99 on the right 1/15; superficial wound on the right lateral calf in the setting of severe acute skin changes from chronic venous insufficiency and lymphedema. We used Iodoflex last week she complained of a lot of pain 1/22; this is a small but difficult wound on the right lateral calf in the setting of severe chronic venous insufficiency and secondary lymphedema.  She continues to state the wounds things hurts when she is up on it but seems to be relieved by putting her leg up. I changed her to [facility] last week because the Iodoflex seem to be causing stinging. 1/29. This wound appears to be contracting somewhat. Changes of chronic venous insufficiency with secondary lymphedema 2/12; not as good as surface today and slightly bigger. I had to change her from Iodoflex to [facility] because of the stinging pain although she does not think it was any better on the [facility]. She comes into clinic today with a area on the medial left great toe. She said she noticed blood on her sock last Saturday. She had some form of bunion surgery by Dr. Ila Mcgill her podiatrist sometime in 2019 she said he "shave the area". I could not find his operative report although I did see reference to the bunion in the area. 2/19; somewhat improved wound on the right lateral lower leg however the wound she came in on the bunion of her left great toe is actually I think larger still somewhat inflamed. We have been using Iodoflex 2/26; not much improvement in either wound area which is the original venous wound on the right lateral lower leg and in the area on the bunion of her left great toe. This still looks somewhat inflamed and tender. We have been using Iodoflex with open much improvement. 3/4; in general both her wounds look better this includes the original venous insufficiency wound on the right lateral lower leg and the area on her tip of her bunion on the left great toe medial aspect of the MTP. Change  to [facility] last week 3/12; the patient still has a small geographic shaped wound on the right lateral lower leg. We have been using Hydrofera Blue but I changed her to endoform today. She also has the area in the tip of the bunion of the left great toe. We will try Hydrofera Blue here as well 3/19; the small geographic wound on the right lateral lower leg  has not filled in. There is some surrounding induration we have noticed this previously. This may be venous inflammation but I wonder about biopsying this if this is not closing up. The area over the left medial first MTP [bunion deformity] requires debridement. This is not closing in. I am not convinced she is offloading adequately 3/26; right lateral lower leg in the setting of severe venous insufficiency and also an area over the first MTP bunion deformity. No debridement is required. We have been using endoform 4/9; right lateral lower leg wound in the setting of severe venous insufficiency and also a refractory area over the first MTP bunion deformity. The area on the right lateral lower leg is just about closed there is still a minor open area here. She comes in today with a mirror image area on the left lateral lower leg. She says this started as a scab 2 weeks ago and is gradually morphed into an open wound very similar to what she has on the right leg. She has severe bilateral venous insufficiency with venous hypertension obvious from clinical exam. 4/16; her original wound the right lateral leg wound in the setting of chronic venous hypertension is a small open area but very small. We have been using endoform. Her wound over the left first MTP bunion deformity also appears to be small and closing in. Unfortunately she has a large relative area on the left lateral calf which was new last week. Very adherent debris over this we have been using Iodoflex however that may be contributing to the debris on the wound surface 4/23; the original wound is closed and the area on the left first metatarsal bunion deformity is also almost closed the new area from last week required debridement. She went for her reflux studies that did not take her lower extremity wraps off. This is not helpful. She did have significant reflux in the right common femoral vein. I am not going to press this issue further.  She clinically has severe venous hypertension 4/29; the original wound is closed on the right lateral lower calf. The area on the left first metatarsal/bunion deformity has a small slitlike opening that is still not closed. The real problem here is now wound on the left lateral calf which is necrotic and deep. We used Iodoflex on this wound last time. Silver alginate on the left first toe The patient has Farrow wraps. We should be able to transition the right leg into compression stockings and were doing this today. 5/7; the original wound remains closed on the right lateral calf. The left first metatarsal head still is open. Deterioration in the wound which is the new wound from several weeks ago on the left lateral calf. The patient is not aware how this could have happened. We have been using Sorbact starting last week under 3 layer compression 5/14; the right lateral calf is closed the area on the first met head on the left is closed However the new area from 3 weeks ago on the left lateral calf continues to expand. There is raised nodular tissue around  the wound necrotic debris on the surface. Circumference of the wound also looks necrotic. It looks as though there is involvement of the tissue under the skin around the large area of this wound which looks threatened. She is complaining of pain. Culture of this wound I did last week showed rare coag negative staph variant I have never heard of although I am doubtful this has clinical significance 05/09/20-Patient returns with a left lateral calf wound looking about the same, the biopsies reviewed show no atypical features and consistent with trauma or pressure injury or stasis change, patient has appointment to be scheduled with vascular We are using silver alginate and 3 layer compression Patient was started on Trental by dermatology which she has been taking for a week now Electronic Signature(s) Signed: 05/09/2020 9:41:24 AM By: Tobi Bastos MD, MBA Entered By: Tobi Bastos on 05/09/2020 09:41:23 -------------------------------------------------------------------------------- Physical Exam Details Patient Name: Date of Service: Tracy Banker J. 05/09/2020 9:30 A M Medical Record Number: [id number] Patient Account Number: [id number] Date of Birth/Sex: Treating RN: Aug 31, 1933 (86 y.o. [name] Primary Care Provider: Patrina Levering Other Clinician: Referring Provider: Treating Provider/Extender: Gifford Shave T Weeks in Treatment: 20 Constitutional alert and oriented x 3. sitting or standing blood pressure is within target range for patient.. supine blood pressure is within target range for patient.. pulse regular and within target range for patient.Marland Kitchen respirations regular, non-labored and within target range for patient.Marland Kitchen temperature within target range for patient.. . . Well- nourished and well-hydrated in no acute distress. Notes Left lateral calf wound with a small rim of eschar wound base appears without slough and no signs of infection in or around the wound Electronic Signature(s) Signed: 05/09/2020 9:41:59 AM By: Tobi Bastos MD, MBA Entered By: Tobi Bastos on 05/09/2020 09:41:59 -------------------------------------------------------------------------------- Physician Orders Details Patient Name: Date of Service: [name]. 05/09/2020 9:30 A M Medical Record Number: [id number] Patient Account Number: [id number] Date of Birth/Sex: Treating RN: [birth year] (86 y.o. [name] Primary Care Provider: Lajean Manes T Other Clinician: Referring Provider: Treating Provider/Extender: Gifford Shave T Weeks in Treatment: 20 Verbal / Phone Orders: No Diagnosis Coding ICD-10 Coding Code Description [phone] Chronic venous hypertension (idiopathic) with inflammation of right lower extremity L97.828 Non-pressure chronic ulcer of other part of left lower  leg with other specified severity I87.322 Chronic venous hypertension (idiopathic) with inflammation of left lower extremity L97.522 Non-pressure chronic ulcer of other part of left foot with fat layer exposed Follow-up Appointments ppointment in 1 week. - Friday Return A Other: - Make and Appointment with Vein and Vascular. Remove dressing the day of when you see them Dressing Change Frequency Wound #5 Left,Lateral Lower Leg Do not change entire dressing for one week. Wound #6 Right T Great oe Change Dressing every other day. Skin Barriers/Peri-Wound Care Moisturizing lotion - patient to lotion right leg every night. TCA Cream or Ointment - mixed with lotion to left leg Wound Cleansing May shower with protection. - cast protector Primary Wound Dressing Wound #5 Left,Lateral Lower Leg Calcium Alginate with Silver Wound #6 Right T Great oe Calcium Alginate with Silver Secondary Dressing Wound #5 Left,Lateral Lower Leg Dry Gauze ABD pad Wound #6 Right T Great oe Kerlix/Rolled Gauze Dry Gauze Edema Control 3 Layer Compression System - Left Lower Extremity - use kerlix instead of cotton Avoid standing for long periods of time Elevate legs to the level of the heart or above for 30 minutes daily and/or when  sitting, a frequency of: - throughout the day. Exercise regularly Support Garment 20-30 mm/Hg pressure to: - patient to apply farrow wrap 4000 to right leg. Apply in the morning and remove at night. Off-Loading Open toe surgical shoe to: - left Electronic Signature(s) Signed: 05/09/2020 4:30:26 PM By: Tobi Bastos MD, MBA Signed: 05/09/2020 4:59:31 PM By: Kela Millin Entered By: Kela Millin on 05/09/2020 09:34:29 -------------------------------------------------------------------------------- Problem List Details Patient Name: Date of Service: [name]. 05/09/2020 9:30 A M Medical Record Number: [id number] Patient Account Number: [id number] Date of  Birth/Sex: Treating RN: May 19, 1933 (86 y.o. [name] Primary Care Provider: Lajean Manes T Other Clinician: Referring Provider: Treating Provider/Extender: [place] Large Weeks in Treatment: 20 Active Problems ICD-10 Encounter Code Description Active Date MDM Diagnosis I87.321 Chronic venous hypertension (idiopathic) with inflammation of right lower 12/20/2019 No Yes extremity L97.828 Non-pressure chronic ulcer of other part of left lower leg with other specified 03/21/2020 No Yes severity I87.322 Chronic venous hypertension (idiopathic) with inflammation of left lower 02/14/2020 No Yes extremity L97.522 Non-pressure chronic ulcer of other part of left foot with fat layer exposed 01/25/2020 No Yes Inactive Problems ICD-10 Code Description Active Date Inactive Date L97.812 Non-pressure chronic ulcer of other part of right lower leg with fat layer exposed 12/20/2019 12/20/2019 Resolved Problems Electronic Signature(s) Signed: 05/09/2020 4:30:26 PM By: Tobi Bastos MD, MBA Signed: 05/09/2020 4:59:31 PM By: Kela Millin Entered By: Kela Millin on 05/09/2020 09:32:42 -------------------------------------------------------------------------------- Progress Note Details Patient Name: Date of Service: Tracy Banker J. 05/09/2020 9:30 A M Medical Record Number: [id number] Patient Account Number: [id number] Date of Birth/Sex: Treating RN: 19-Nov-1933 (86 y.o. [name] Primary Care Provider: Patrina Levering Other Clinician: Referring Provider: Treating Provider/Extender: Gifford Shave T Weeks in Treatment: 20 Subjective History of Present Illness (HPI) ADMISSION 12/20/2019 This is an 86 year old woman referred by her primary physician Dr. Felipa Eth for review of a wound on her right lateral lower leg. She was actually in this clinic on 2 separate occasions in 2010 and 2012 cared for by Dr. Sherilyn Cooter. At that point in time she had  wounds on her right leg as well. She tells [place] to 1 month ago she noticed a scab building up on her right lateral lower leg this opened into a wound. There was no overt cause of this no trauma, no infection that she is aware of. She has a history of chronic venous insufficiency and wears compression stockings fairly religiously indeed she is done well over the last 8 years since she was last in this clinic. She has been applying Vaseline on this and a covering phone. This is not progressing towards healing. Past medical history; interstitial lung disease, chronic atrial fibrillation status post pacemaker, osteoarthritis of the left knee, left breast CA, chronic repeat venous insufficiency. She takes Eliquis for her atrial fibrillation stroke prophylaxis. ABI in our clinic was 0.99 on the right 1/15; superficial wound on the right lateral calf in the setting of severe acute skin changes from chronic venous insufficiency and lymphedema. We used Iodoflex last week she complained of a lot of pain 1/22; this is a small but difficult wound on the right lateral calf in the setting of severe chronic venous insufficiency and secondary lymphedema. She continues to state the wounds things hurts when she is up on it but seems to be relieved by putting her leg up. I changed her to [facility] last week because the Iodoflex seem to be causing  stinging. 1/29. This wound appears to be contracting somewhat. Changes of chronic venous insufficiency with secondary lymphedema 2/12; not as good as surface today and slightly bigger. I had to change her from Iodoflex to [facility] because of the stinging pain although she does not think it was any better on the [facility]. ooShe comes into clinic today with a area on the medial left great toe. She said she noticed blood on her sock last Saturday. She had some form of bunion surgery by Dr. Ila Mcgill her podiatrist sometime in 2019 she said he "shave the area".  I could not find his operative report although I did see reference to the bunion in the area. 2/19; somewhat improved wound on the right lateral lower leg however the wound she came in on the bunion of her left great toe is actually I think larger still somewhat inflamed. We have been using Iodoflex 2/26; not much improvement in either wound area which is the original venous wound on the right lateral lower leg and in the area on the bunion of her left great toe. This still looks somewhat inflamed and tender. We have been using Iodoflex with open much improvement. 3/4; in general both her wounds look better this includes the original venous insufficiency wound on the right lateral lower leg and the area on her tip of her bunion on the left great toe medial aspect of the MTP. Change to Hydrofera Blue last week 3/12; the patient still has a small geographic shaped wound on the right lateral lower leg. We have been using Hydrofera Blue but I changed her to endoform today. She also has the area in the tip of the bunion of the left great toe. We will try Hydrofera Blue here as well 3/19; the small geographic wound on the right lateral lower leg has not filled in. There is some surrounding induration we have noticed this previously. This may be venous inflammation but I wonder about biopsying this if this is not closing up. The area over the left medial first MTP [bunion deformity] requires debridement. This is not closing in. I am not convinced she is offloading adequately 3/26; right lateral lower leg in the setting of severe venous insufficiency and also an area over the first MTP bunion deformity. No debridement is required. We have been using endoform 4/9; right lateral lower leg wound in the setting of severe venous insufficiency and also a refractory area over the first MTP bunion deformity. The area on the right lateral lower leg is just about closed there is still a minor open area here. She  comes in today with a mirror image area on the left lateral lower leg. She says this started as a scab 2 weeks ago and is gradually morphed into an open wound very similar to what she has on the right leg. She has severe bilateral venous insufficiency with venous hypertension obvious from clinical exam. 4/16; her original wound the right lateral leg wound in the setting of chronic venous hypertension is a small open area but very small. We have been using endoform. Her wound over the left first MTP bunion deformity also appears to be small and closing in. Unfortunately she has a large relative area on the left lateral calf which was new last week. Very adherent debris over this we have been using Iodoflex however that may be contributing to the debris on the wound surface 4/23; the original wound is closed and the area on the left  first metatarsal bunion deformity is also almost closed the new area from last week required debridement. She went for her reflux studies that did not take her lower extremity wraps off. This is not helpful. She did have significant reflux in the right common femoral vein. I am not going to press this issue further. She clinically has severe venous hypertension 4/29; the original wound is closed on the right lateral lower calf. The area on the left first metatarsal/bunion deformity has a small slitlike opening that is still not closed. The real problem here is now wound on the left lateral calf which is necrotic and deep. We used Iodoflex on this wound last time. Silver alginate on the left first toe The patient has Farrow wraps. We should be able to transition the right leg into compression stockings and were doing this today. 5/7; the original wound remains closed on the right lateral calf. The left first metatarsal head still is open. Deterioration in the wound which is the new wound from several weeks ago on the left lateral calf. The patient is not aware how this could  have happened. We have been using Sorbact starting last week under 3 layer compression 5/14; the right lateral calf is closed the area on the first met head on the left is closed However the new area from 3 weeks ago on the left lateral calf continues to expand. There is raised nodular tissue around the wound necrotic debris on the surface. Circumference of the wound also looks necrotic. It looks as though there is involvement of the tissue under the skin around the large area of this wound which looks threatened. She is complaining of pain. Culture of this wound I did last week showed rare coag negative staph variant I have never heard of although I am doubtful this has clinical significance 05/09/20-Patient returns with a left lateral calf wound looking about the same, the biopsies reviewed show no atypical features and consistent with trauma or pressure injury or stasis change, patient has appointment to be scheduled with vascular We are using silver alginate and 3 layer compression Patient was started on Trental by dermatology which she has been taking for a week now Objective Constitutional alert and oriented x 3. sitting or standing blood pressure is within target range for patient.. supine blood pressure is within target range for patient.. pulse regular and within target range for patient.Marland Kitchen respirations regular, non-labored and within target range for patient.Marland Kitchen temperature within target range for patient.. Well- nourished and well-hydrated in no acute distress. Vitals Time Taken: 8:40 AM, Height: 69 in, Weight: 163 lbs, BMI: 24.1, Temperature: 97.8 F, Pulse: 68 bpm, Respiratory Rate: 20 breaths/min, Blood Pressure: 159/65 mmHg. General Notes: Left lateral calf wound with a small rim of eschar wound base appears without slough and no signs of infection in or around the wound Integumentary (Hair, Skin) Wound #5 status is Open. Original cause of wound was Gradually Appeared. The wound is  located on the Left,Lateral Lower Leg. The wound measures 3.3cm length x 2.2cm width x 0.2cm depth; 5.702cm^2 area and 1.14cm^3 volume. There is no tunneling or undermining noted. There is a medium amount of serosanguineous drainage noted. The wound margin is flat and intact. There is no granulation within the wound bed. There is a large (67-100%) amount of necrotic tissue within the wound bed including Eschar and Adherent Slough. Wound #6 status is Open. Original cause of wound was Blister. The wound is located on the Right T Great. The  wound measures 1.7cm length x 3cm width x oe 0.1cm depth; 4.006cm^2 area and 0.401cm^3 volume. There is Fat Layer (Subcutaneous Tissue) Exposed exposed. There is no tunneling or undermining noted. There is a medium amount of serosanguineous drainage noted. There is large (67-100%) red, pink granulation within the wound bed. There is a small (1-33%) amount of necrotic tissue within the wound bed including Adherent Slough. 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 There was a Three Layer Compression Therapy Procedure by [name], RN. Post procedure Diagnosis Wound #5: Same as Pre-Procedure Plan Follow-up Appointments: Return Appointment in 1 week. - Friday Other: - Make and Appointment with Vein and Vascular. Remove dressing the day of when you see them Dressing Change Frequency: Wound #5 Left,Lateral Lower Leg: Do not change entire dressing for one week. Wound #6 Right T Great: oe Change Dressing every other  day. Skin Barriers/Peri-Wound Care: Moisturizing lotion - patient to lotion right leg every night. TCA Cream or Ointment - mixed with lotion to left leg Wound Cleansing: May shower with protection. - cast protector Primary Wound Dressing: Wound #5 Left,Lateral Lower Leg: Calcium Alginate with Silver Wound #6 Right T Great: oe Calcium Alginate with Silver Secondary Dressing: Wound #5 Left,Lateral Lower Leg: Dry Gauze ABD pad Wound #6 Right T Great: oe Kerlix/Rolled Gauze Dry Gauze Edema Control: 3 Layer Compression System - Left Lower Extremity - use kerlix instead of cotton Avoid standing for long periods of time Elevate legs to the level of the heart or above for 30 minutes daily and/or when sitting, a frequency of: - throughout the day. Exercise regularly Support Garment 20-30 mm/Hg pressure to: - patient to apply farrow wrap 4000 to right leg. Apply in the morning and remove at night. Off-Loading: Open toe surgical shoe to: - left -Continue silver alginate dressing with 3 layer compression -Patient to have appointment with vascular to follow-up on this left leg wound and studies -Return to clinic 1 week Electronic Signature(s) Signed: 05/09/2020 9:42:42 AM By: Tobi Bastos MD, MBA Entered By: Tobi Bastos on 05/09/2020 09:42:41 -------------------------------------------------------------------------------- SuperBill Details Patient Name: Date of Service: [name]. 05/09/2020 Medical Record Number: [id number] Patient Account Number: [id number] Date of Birth/Sex: Treating RN: 29-Aug-1933 (86 y.o. [name] Primary Care Provider: Lajean Manes T Other Clinician: Referring Provider: Treating Provider/Extender: Gifford Shave T Weeks in Treatment: 20 Diagnosis Coding ICD-10 Codes Code Description [phone] Chronic venous hypertension (idiopathic) with inflammation of right lower extremity L97.828 Non-pressure chronic ulcer of other  part of left lower leg with other specified severity I87.322 Chronic venous hypertension (idiopathic) with inflammation of left lower extremity L97.522 Non-pressure chronic ulcer of other part of left foot with fat layer exposed Facility Procedures The patient participates with Medicare or their insurance follows the Medicare Facility Guidelines: CPT4 Code Description Modifier Quantity [id number] (Facility Use Only) [phone] - [place] 1 Physician Procedures : CPT4 Code Description Modifier [id number] - WC PHYS LEVEL 3 - EST PT ICD-10 Diagnosis Description L97.828 Non-pressure chronic ulcer of other part of left lower leg with other specified severity Quantity: 1 Electronic Signature(s)  Signed: 05/09/2020 9:42:57 AM By: Tobi Bastos MD, MBA Entered By: Tobi Bastos on 05/09/2020 09:42:56

## 2020-05-09 NOTE — Progress Notes (Addendum)
[name], ROPPOLO ([id number]) Visit Report for 05/09/2020 Arrival Information Details Patient Name: Date of Service: [place], [name] 05/09/2020 9:30 A M Medical Record Number: [id number] Patient Account Number: [id number] Date of Birth/Sex: Treating RN: [birth year] (86 y.o. [name] Primary Care Cythia Bachtel: Lajean Manes T Other Clinician: Referring Koah Chisenhall: Treating Terri Malerba/Extender: Macon Large Weeks in Treatment: 20 Visit Information History Since Last Visit All ordered tests and consults were completed: No Patient Arrived: Ambulatory Added or deleted any medications: No Arrival Time: 08:38 Any new allergies or adverse reactions: No Accompanied By: husband Had a fall or experienced change in No Transfer Assistance: None activities of daily living that may affect Patient Identification Verified: Yes risk of falls: Secondary Verification Process Completed: Yes Signs or symptoms of abuse/neglect since last visito No Patient Requires Transmission-Based Precautions: No Hospitalized since last visit: No Patient Has Alerts: Yes Implantable device outside of the clinic excluding No Patient Alerts: Patient on Blood Thinner cellular tissue based products placed in the center Right ABI:0.99 since last visit: Has Dressing in Place as Prescribed: Yes Has Compression in Place as Prescribed: Yes Pain Present Now: No Electronic Signature(s) Signed: 05/09/2020 9:26:18 AM By: Carlene Coria RN Entered By: Carlene Coria on 05/09/2020 08:40:19 -------------------------------------------------------------------------------- Compression Therapy Details Patient Name: Date of Service: [name]. 05/09/2020 9:30 A M Medical Record Number: [id number] Patient Account Number: [id number] Date of Birth/Sex: Treating RN: October 29, 1933 (86 y.o. [name] Primary Care Jacobi Nile: Patrina Levering Other Clinician: Referring Alyissa Whidbee: Treating Azavion Bouillon/Extender:  Gifford Shave T Weeks in Treatment: 20 Compression Therapy Performed for Wound Assessment: Wound #5 Left,Lateral Lower Leg Performed By: Clinician [name], RN Compression Type: Three Layer Post Procedure Diagnosis Same as Pre-procedure Electronic Signature(s) Signed: 05/09/2020 4:59:31 PM By: Kela Millin Entered By: Kela Millin on 05/09/2020 09:32:30 -------------------------------------------------------------------------------- Encounter Discharge Information Details Patient Name: Date of Service: [name]. 05/09/2020 9:30 A M Medical Record Number: [id number] Patient Account Number: [id number] Date of Birth/Sex: Treating RN: Oct 24, 1933 (86 y.o. [name] Primary Care Kiriana Worthington: Lajean Manes T Other Clinician: Referring Jerriann Schrom: Treating Thora Scherman/Extender: Macon Large Weeks in Treatment: 20 Encounter Discharge Information Items Discharge Condition: Stable Ambulatory Status: Ambulatory Discharge Destination: Home Transportation: Private Auto Accompanied By: spouse Schedule Follow-up Appointment: Yes Clinical Summary of Care: Patient Declined Electronic Signature(s) Signed: 05/09/2020 4:59:25 PM By: [name] RN, BSN Entered By: [name] on 05/09/2020 09:58:40 -------------------------------------------------------------------------------- Lower Extremity Assessment Details Patient Name: Date of Service: [name], [name]. 05/09/2020 9:30 A M Medical Record Number: [id number] Patient Account Number: [id number] Date of Birth/Sex: Treating RN: May 31, 1933 (86 y.o. [name] Primary Care Amyiah Gaba: Lajean Manes T Other Clinician: Referring Daveigh Batty: Treating Nely Dedmon/Extender: Althea Charon, Hal T Weeks in Treatment: 20 Edema Assessment Assessed: [Left: No] [Right: No] Edema: [Left: No] [Right: No] Calf Left: Right: Point of Measurement: 42 cm From Medial Instep 30  cm cm Ankle Left: Right: Point of Measurement: 13 cm From Medial Instep 21 cm cm Electronic Signature(s) Signed: 05/09/2020 9:26:18 AM By: Carlene Coria RN Entered By: Carlene Coria on 05/09/2020 08:50:32 -------------------------------------------------------------------------------- Multi-Disciplinary Care Plan Details Patient Name: Date of Service: [name], [name]. 05/09/2020 9:30 A M Medical Record Number: [id number] Patient Account Number: [id number] Date of Birth/Sex: Treating RN: 06/15/1933 (86 y.o. [name] Primary Care Navaya Wiatrek: Patrina Levering Other Clinician: Referring Sally Reimers: Treating Costella Schwarz/Extender: Gifford Shave T Weeks in Treatment: 20 Active Inactive Abuse / Safety /  Falls / Self Care Management Nursing Diagnoses: Potential for falls Goals: Patient/caregiver will verbalize understanding of skin care regimen Date Initiated: 12/20/2019 Date Inactivated: 02/14/2020 Target Resolution Date: 02/22/2020 Goal Status: Met Patient/caregiver will verbalize/demonstrate understanding of what to do in case of emergency Date Initiated: 12/20/2019 Target Resolution Date: 05/30/2020 Goal Status: Active Interventions: Assess fall risk on admission and as needed Provide education on fall prevention Notes: Pain, Acute or Chronic Nursing Diagnoses: Pain, acute or chronic: actual or potential Potential alteration in comfort, pain Goals: Patient will verbalize adequate pain control and receive pain control interventions during procedures as needed Date Initiated: 12/20/2019 Date Inactivated: 02/14/2020 Target Resolution Date: 02/22/2020 Goal Status: Met Patient/caregiver will verbalize comfort level met Date Initiated: 12/20/2019 Target Resolution Date: 05/30/2020 Goal Status: Active Interventions: Complete pain assessment as per visit requirements Provide education on pain management Notes: Wound/Skin Impairment Nursing Diagnoses: Knowledge deficit  related to ulceration/compromised skin integrity Goals: Patient/caregiver will verbalize understanding of skin care regimen Date Initiated: 12/20/2019 Target Resolution Date: 05/30/2020 Goal Status: Active Interventions: Assess patient/caregiver ability to perform ulcer/skin care regimen upon admission and as needed Provide education on ulcer and skin care Treatment Activities: Skin care regimen initiated : 12/20/2019 Topical wound management initiated : 12/20/2019 Notes: Electronic Signature(s) Signed: 05/09/2020 4:59:31 PM By: Kela Millin Entered By: Kela Millin on 05/09/2020 09:35:15 -------------------------------------------------------------------------------- Pain Assessment Details Patient Name: Date of Service: [name]. 05/09/2020 9:30 A M Medical Record Number: [id number] Patient Account Number: [id number] Date of Birth/Sex: Treating RN: 03-Jul-1933 (86 y.o. [name] Primary Care Fotios Amos: Lajean Manes T Other Clinician: Referring Ryo Klang: Treating Kyira Volkert/Extender: Gifford Shave T Weeks in Treatment: 20 Active Problems Location of Pain Severity and Description of Pain Patient Has Paino No Site Locations Pain Management and Medication Current Pain Management: Electronic Signature(s) Signed: 05/09/2020 9:26:18 AM By: Carlene Coria RN Entered By: Carlene Coria on 05/09/2020 08:43:02 -------------------------------------------------------------------------------- Patient/Caregiver Education Details Patient Name: Date of Service: [place], [name] 5/28/2021andnbsp9:30 A M Medical Record Number: [id number] Patient Account Number: [id number] Date of Birth/Gender: Treating RN: 10-21-1933 (86 y.o. [name] Primary Care Physician: Lajean Manes T Other Clinician: Referring Physician: Treating Physician/Extender: Ida Rogue in Treatment: 20 Education Assessment Education Provided  To: Patient Education Topics Provided Pain: Handouts: A Guide to Pain Control Methods: Explain/Verbal Responses: State content correctly Safety: Handouts: Medication Safety Methods: Explain/Verbal Responses: State content correctly Wound/Skin Impairment: Handouts: Caring for Your Ulcer Methods: Explain/Verbal Responses: State content correctly Electronic Signature(s) Signed: 05/09/2020 4:59:31 PM By: Kela Millin Entered By: Kela Millin on 05/09/2020 09:36:01 -------------------------------------------------------------------------------- Wound Assessment Details Patient Name: Date of Service: [name]. 05/09/2020 9:30 A M Medical Record Number: [id number] Patient Account Number: [id number] Date of Birth/Sex: Treating RN: May 09, 1933 (86 y.o. [name] Primary Care Reid Regas: Lajean Manes T Other Clinician: Referring Lauria Depoy: Treating Eliah Marquard/Extender: Gifford Shave T Weeks in Treatment: 20 Wound Status Wound Number: 5 Primary Venous Leg Ulcer Etiology: Wound Location: Left, Lateral Lower Leg Wound Open Wounding Event: Gradually Appeared Status: Date Acquired: 03/21/2020 Comorbid Cataracts, Anemia, Arrhythmia, Congestive Heart Failure, Weeks Of Treatment: 7 History: Hypertension, Peripheral Venous Disease, Received Radiation Clustered Wound: No Wound Measurements Length: (cm) 3.3 Width: (cm) 2.2 Depth: (cm) 0.2 Area: (cm) 5.702 Volume: (cm) 1.14 % Reduction in Area: -1033.6% % Reduction in Volume: -2180% Epithelialization: None Tunneling: No Undermining: No Wound Description Classification: Unclassifiable Wound Margin: Flat and Intact Exudate Amount: Medium Exudate Type: Serosanguineous Exudate Color: red, brown  Foul Odor After Cleansing: No Slough/Fibrino Yes Wound Bed Granulation Amount: None Present (0%) Exposed Structure Necrotic Amount: Large (67-100%) Fascia Exposed: No Necrotic Quality: Eschar, Adherent  Slough Fat Layer (Subcutaneous Tissue) Exposed: No Tendon Exposed: No Muscle Exposed: No Joint Exposed: No Bone Exposed: No Treatment Notes Wound #5 (Left, Lateral Lower Leg) 2. Periwound Care Moisturizing lotion TCA Cream 3. Primary Dressing Applied Calcium Alginate Ag 4. Secondary Dressing Dry Gauze 6. Support Layer Applied 3 layer compression wrap Notes netting. Electronic Signature(s) Signed: 05/09/2020 9:26:18 AM By: Carlene Coria RN Entered By: Carlene Coria on 05/09/2020 08:50:17 -------------------------------------------------------------------------------- Wound Assessment Details Patient Name: Date of Service: [name], [name] 05/09/2020 9:30 A M Medical Record Number: [id number] Patient Account Number: [id number] Date of Birth/Sex: Treating RN: 1933-04-14 (86 y.o. [name] Primary Care Marylin Lathon: Lajean Manes T Other Clinician: Referring Daelyn Mozer: Treating Tashira Torre/Extender: Gifford Shave T Weeks in Treatment: 20 Wound Status Wound Number: 6 Primary Trauma, Other Etiology: Wound Location: Right T Great oe Wound Open Wounding Event: Blister Status: Date Acquired: 04/30/2020 Comorbid Cataracts, Anemia, Arrhythmia, Congestive Heart Failure, Weeks Of Treatment: 0 History: Hypertension, Peripheral Venous Disease, Received Radiation Clustered Wound: No Wound Measurements Length: (cm) 1.7 Width: (cm) 3 Depth: (cm) 0.1 Area: (cm) 4.006 Volume: (cm) 0.401 % Reduction in Area: % Reduction in Volume: Epithelialization: None Tunneling: No Undermining: No Wound Description Classification: Full Thickness Without Exposed Support Structures Exudate Amount: Medium Exudate Type: Serosanguineous Exudate Color: red, brown Foul Odor After Cleansing: No Slough/Fibrino Yes Wound Bed Granulation Amount: Large (67-100%) Exposed Structure Granulation Quality: Red, Pink Fascia Exposed: No Necrotic Amount: Small (1-33%) Fat Layer  (Subcutaneous Tissue) Exposed: Yes Necrotic Quality: Adherent Slough Tendon Exposed: No Muscle Exposed: No Joint Exposed: No Bone Exposed: No Treatment Notes Wound #6 (Right Toe Great) 3. Primary Dressing Applied Calcium Alginate Ag 4. Secondary Dressing Roll Gauze Electronic Signature(s) Signed: 05/09/2020 9:26:18 AM By: Carlene Coria RN Entered By: Carlene Coria on 05/09/2020 08:49:04 -------------------------------------------------------------------------------- [employer] Details Patient Name: Date of Service: Tracy Banker J. 05/09/2020 9:30 A M Medical Record Number: [id number] Patient Account Number: [id number] Date of Birth/Sex: Treating RN: 05-09-1933 (86 y.o. [name] Primary Care Makena Mcgrady: Lajean Manes T Other Clinician: Referring Tarvis Blossom: Treating Danijela Vessey/Extender: Gifford Shave T Weeks in Treatment: 20 Vital Signs Time Taken: 08:40 Temperature (F): 97.8 Height (in): 69 Pulse (bpm): 68 Weight (lbs): 163 Respiratory Rate (breaths/min): 20 Body Mass Index (BMI): 24.1 Blood Pressure (mmHg): 159/65 Reference Range: 80 - 120 mg / dl Electronic Signature(s) Signed: 05/09/2020 9:26:18 AM By: Carlene Coria RN Entered By: Carlene Coria on 05/09/2020 08:42:55

## 2020-05-16 ENCOUNTER — Other Ambulatory Visit: Payer: Self-pay

## 2020-05-16 ENCOUNTER — Encounter (HOSPITAL_BASED_OUTPATIENT_CLINIC_OR_DEPARTMENT_OTHER): Payer: Medicare Other | Attending: Internal Medicine | Admitting: Internal Medicine

## 2020-05-16 DIAGNOSIS — L97522 Non-pressure chronic ulcer of other part of left foot with fat layer exposed: Secondary | ICD-10-CM | POA: Insufficient documentation

## 2020-05-16 DIAGNOSIS — I87333 Chronic venous hypertension (idiopathic) with ulcer and inflammation of bilateral lower extremity: Secondary | ICD-10-CM | POA: Insufficient documentation

## 2020-05-16 DIAGNOSIS — L97828 Non-pressure chronic ulcer of other part of left lower leg with other specified severity: Secondary | ICD-10-CM | POA: Insufficient documentation

## 2020-05-19 NOTE — Progress Notes (Signed)
[name], [name] ([id number]) Visit Report for 05/16/2020 Debridement Details Patient Name: Date of Service: [place], [name] 05/16/2020 9:15 A M Medical Record Number: [id number] Patient Account Number: [id number] Date of Birth/Sex: Treating RN: 06/18/1933 (86 y.o. [name] Primary Care Provider: Lajean Manes T Other Clinician: Referring Provider: Treating Provider/Extender: Evelena Peat in Treatment: 21 Debridement Performed for Assessment: Wound #5 Left,Lateral Lower Leg Performed By: Physician Ricard Dillon., MD Debridement Type: Debridement Severity of Tissue Pre Debridement: Fat layer exposed Level of Consciousness (Pre-procedure): Awake and Alert Pre-procedure Verification/Time Out Yes - 09:50 Taken: Start Time: 09:50 Pain Control: Other : benzocaine, 20% T Area Debrided (L x W): otal 3.1 (cm) x 2 (cm) = 6.2 (cm) Tissue and other material debrided: Eschar, Subcutaneous, Skin: Dermis Level: Skin/Subcutaneous Tissue Debridement Description: Excisional Instrument: Curette Bleeding: Minimum Hemostasis Achieved: Pressure End Time: 09:52 Procedural Pain: 0 Post Procedural Pain: 0 Response to Treatment: Procedure was tolerated well Level of Consciousness (Post- Awake and Alert procedure): Post Debridement Measurements of Total Wound Length: (cm) 3.1 Width: (cm) 2 Depth: (cm) 0.2 Volume: (cm) 0.974 Character of Wound/Ulcer Post Debridement: Requires Further Debridement Severity of Tissue Post Debridement: Fat layer exposed Post Procedure Diagnosis Same as Pre-procedure Electronic Signature(s) Signed: 05/16/2020 4:23:17 PM By: Kela Millin Signed: 05/19/2020 5:22:40 PM By: Linton Ham MD Entered By: Linton Ham on 05/16/2020 10:05:00 -------------------------------------------------------------------------------- HPI Details Patient Name: Date of Service: [name]. 05/16/2020 9:15 A M Medical Record Number:  [id number] Patient Account Number: [id number] Date of Birth/Sex: Treating RN: 20-Dec-1932 (86 y.o. [name] Primary Care Provider: Patrina Levering Other Clinician: Referring Provider: Treating Provider/Extender: Evelena Peat in Treatment: 21 History of Present Illness HPI Description: ADMISSION 12/20/2019 This is an 86 year old woman referred by her primary physician Dr. Felipa Eth for review of a wound on her right lateral lower leg. She was actually in this clinic on 2 separate occasions in 2010 and 2012 cared for by Dr. Sherilyn Cooter. At that point in time she had wounds on her right leg as well. She tells [place] to 1 month ago she noticed a scab building up on her right lateral lower leg this opened into a wound. There was no overt cause of this no trauma, no infection that she is aware of. She has a history of chronic venous insufficiency and wears compression stockings fairly religiously indeed she is done well over the last 8 years since she was last in this clinic. She has been applying Vaseline on this and a covering phone. This is not progressing towards healing. Past medical history; interstitial lung disease, chronic atrial fibrillation status post pacemaker, osteoarthritis of the left knee, left breast CA, chronic repeat venous insufficiency. She takes Eliquis for her atrial fibrillation stroke prophylaxis. ABI in our clinic was 0.99 on the right 1/15; superficial wound on the right lateral calf in the setting of severe acute skin changes from chronic venous insufficiency and lymphedema. We used Iodoflex last week she complained of a lot of pain 1/22; this is a small but difficult wound on the right lateral calf in the setting of severe chronic venous insufficiency and secondary lymphedema. She continues to state the wounds things hurts when she is up on it but seems to be relieved by putting her leg up. I changed her to [facility] last week because the  Iodoflex seem to be causing stinging. 1/29. This wound appears to be contracting somewhat. Changes of chronic  venous insufficiency with secondary lymphedema 2/12; not as good as surface today and slightly bigger. I had to change her from Iodoflex to [facility] because of the stinging pain although she does not think it was any better on the [facility]. She comes into clinic today with a area on the medial left great toe. She said she noticed blood on her sock last Saturday. She had some form of bunion surgery by Dr. Ila Mcgill her podiatrist sometime in 2019 she said he "shave the area". I could not find his operative report although I did see reference to the bunion in the area. 2/19; somewhat improved wound on the right lateral lower leg however the wound she came in on the bunion of her left great toe is actually I think larger still somewhat inflamed. We have been using Iodoflex 2/26; not much improvement in either wound area which is the original venous wound on the right lateral lower leg and in the area on the bunion of her left great toe. This still looks somewhat inflamed and tender. We have been using Iodoflex with open much improvement. 3/4; in general both her wounds look better this includes the original venous insufficiency wound on the right lateral lower leg and the area on her tip of her bunion on the left great toe medial aspect of the MTP. Change to Hydrofera Blue last week 3/12; the patient still has a small geographic shaped wound on the right lateral lower leg. We have been using Hydrofera Blue but I changed her to endoform today. She also has the area in the tip of the bunion of the left great toe. We will try Hydrofera Blue here as well 3/19; the small geographic wound on the right lateral lower leg has not filled in. There is some surrounding induration we have noticed this previously. This may be venous inflammation but I wonder about biopsying this if this is not  closing up. The area over the left medial first MTP [bunion deformity] requires debridement. This is not closing in. I am not convinced she is offloading adequately 3/26; right lateral lower leg in the setting of severe venous insufficiency and also an area over the first MTP bunion deformity. No debridement is required. We have been using endoform 4/9; right lateral lower leg wound in the setting of severe venous insufficiency and also a refractory area over the first MTP bunion deformity. The area on the right lateral lower leg is just about closed there is still a minor open area here. She comes in today with a mirror image area on the left lateral lower leg. She says this started as a scab 2 weeks ago and is gradually morphed into an open wound very similar to what she has on the right leg. She has severe bilateral venous insufficiency with venous hypertension obvious from clinical exam. 4/16; her original wound the right lateral leg wound in the setting of chronic venous hypertension is a small open area but very small. We have been using endoform. Her wound over the left first MTP bunion deformity also appears to be small and closing in. Unfortunately she has a large relative area on the left lateral calf which was new last week. Very adherent debris over this we have been using Iodoflex however that may be contributing to the debris on the wound surface 4/23; the original wound is closed and the area on the left first metatarsal bunion deformity is also almost closed the new area from last  week required debridement. She went for her reflux studies that did not take her lower extremity wraps off. This is not helpful. She did have significant reflux in the right common femoral vein. I am not going to press this issue further. She clinically has severe venous hypertension 4/29; the original wound is closed on the right lateral lower calf. The area on the left first metatarsal/bunion deformity has  a small slitlike opening that is still not closed. The real problem here is now wound on the left lateral calf which is necrotic and deep. We used Iodoflex on this wound last time. Silver alginate on the left first toe The patient has Farrow wraps. We should be able to transition the right leg into compression stockings and were doing this today. 5/7; the original wound remains closed on the right lateral calf. The left first metatarsal head still is open. Deterioration in the wound which is the new wound from several weeks ago on the left lateral calf. The patient is not aware how this could have happened. We have been using Sorbact starting last week under 3 layer compression 5/14; the right lateral calf is closed the area on the first met head on the left is closed However the new area from 3 weeks ago on the left lateral calf continues to expand. There is raised nodular tissue around the wound necrotic debris on the surface. Circumference of the wound also looks necrotic. It looks as though there is involvement of the tissue under the skin around the large area of this wound which looks threatened. She is complaining of pain. Culture of this wound I did last week showed rare coag negative staph variant I have never heard of although I am doubtful this has clinical significance 05/09/20-Patient returns with a left lateral calf wound looking about the same, the biopsies reviewed show no atypical features and consistent with trauma or pressure injury or stasis change, patient has appointment to be scheduled with vascular We are using silver alginate and 3 layer compression Patient was started on Trental by dermatology which she has been taking for a week now 6/4; left lateral calf wound. I reviewed the dermatology notes with Dr. Martin Majestic from [facility] dermatology. 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  Put her on pentoxifylline noteworthy that she is on Eliquis as well. We have been using silver alginate under compression. She has an appointment for repeat vascular studies on 7/6. This gets back to the fact that they did not take the wrap off on the original studies that were done. I do not believe she has an arterial issue. Electronic Signature(s) Signed: 05/19/2020 5:22:40 PM By: Linton Ham MD Entered By: Linton Ham on 05/16/2020 10:06:55 -------------------------------------------------------------------------------- Physical Exam Details Patient Name: Date of Service: [name]. 05/16/2020 9:15 A M Medical Record Number: [id number] Patient Account Number: [id number] Date of Birth/Sex: Treating RN: Jun 22, 1933 (86 y.o. [name] Primary Care Provider: Patrina Levering Other Clinician: Referring Provider: Treating Provider/Extender: [name] in Treatment: 21 Cardiovascular Pedal pulses are palpable. Good edema control but severe venous hypertension. Notes Wound exam; left lateral calf wound looks somewhat better. Better surface. I did a reasonably aggressive debridement with a #5 curette removing necrotic callus and surface debris. This leaves the surface of the wound looking somewhat better to me. Healthy red tissue. I see no evidence of infection Electronic Signature(s) Signed: 05/19/2020  5:22:40 PM By: Linton Ham MD Entered By: Linton Ham on 05/16/2020 45:62:56 -------------------------------------------------------------------------------- Physician Orders Details Patient Name: Date of Service: [name]. 05/16/2020 9:15 A M Medical Record Number: [id number] Patient Account Number: [id number] Date of Birth/Sex: Treating RN: 1933/08/27 (86 y.o. [name] Primary Care Provider: Lajean Manes T Other Clinician: Referring Provider: Treating Provider/Extender: Evelena Peat in Treatment: 21 Verbal / Phone Orders: No Diagnosis Coding ICD-10 Coding Code Description I87.321 Chronic venous hypertension (idiopathic) with inflammation of right lower extremity L97.828 Non-pressure chronic ulcer of other part of left lower leg with other specified severity I87.322 Chronic venous hypertension (idiopathic) with inflammation of left lower extremity L97.522 Non-pressure chronic ulcer of other part of left foot with fat layer exposed Follow-up Appointments Return Appointment in 1 week. Dressing Change Frequency Wound #5 Left,Lateral Lower Leg Do not change entire dressing for one week. Skin Barriers/Peri-Wound Care Moisturizing lotion - patient to lotion right leg every night. TCA Cream or Ointment - mixed with lotion to left leg Wound Cleansing May shower with protection. - cast protector Primary Wound Dressing Wound #5 Left,Lateral Lower Leg pplication - Will run insurance for Apligraft Skin Substitute A Hydrofera Blue Secondary Dressing Wound #5 Left,Lateral Lower Leg Dry Gauze ABD pad Edema Control 3 Layer Compression System - Left Lower Extremity - use kerlix instead of cotton Avoid standing for long periods of time Elevate legs to the level of the heart or above for 30 minutes daily and/or when sitting, a frequency of: - throughout the day. Exercise regularly Support Garment 20-30 mm/Hg pressure to: - patient to apply farrow wrap 4000 to right leg. Apply in the morning and remove at night. Off-Loading Open toe surgical shoe to: - left Electronic Signature(s) Signed: 05/16/2020 4:23:17 PM By: Kela Millin Signed: 05/19/2020 5:22:40 PM By: Linton Ham MD Entered By: Kela Millin on 05/16/2020 09:57:51 -------------------------------------------------------------------------------- Problem List Details Patient Name: Date of Service: [place], [place]. 05/16/2020 9:15 A M Medical Record Number: [id number] Patient Account Number:  [id number] Date of Birth/Sex: Treating RN: 09/07/1933 (86 y.o. [name] Primary Care Provider: Lajean Manes T Other Clinician: Referring Provider: Treating Provider/Extender: Evelena Peat in Treatment: 21 Active Problems ICD-10 Encounter Code Description Active Date MDM Diagnosis I87.321 Chronic venous hypertension (idiopathic) with inflammation of right lower 12/20/2019 No Yes extremity L97.828 Non-pressure chronic ulcer of other part of left lower leg with other specified 03/21/2020 No Yes severity I87.322 Chronic venous hypertension (idiopathic) with inflammation of left lower 02/14/2020 No Yes extremity L97.522 Non-pressure chronic ulcer of other part of left foot with fat layer exposed 01/25/2020 No Yes Inactive Problems ICD-10 Code Description Active Date Inactive Date L97.812 Non-pressure chronic ulcer of other part of right lower leg with fat layer exposed 12/20/2019 12/20/2019 Resolved Problems Electronic Signature(s) Signed: 05/19/2020 5:22:40 PM By: Linton Ham MD Signed: 05/19/2020 5:22:40 PM By: Linton Ham MD Entered By: Linton Ham on 05/16/2020 10:04:33 -------------------------------------------------------------------------------- Progress Note Details Patient Name: Date of Service: [name]. 05/16/2020 9:15 A M Medical Record Number: [id number] Patient Account Number: [id number] Date of Birth/Sex: Treating RN: Jun 30, 1933 (86 y.o. [name] Primary Care Provider: Patrina Levering Other Clinician: Referring Provider: Treating Provider/Extender: Evelena Peat in Treatment: 21 Subjective History of Present Illness (HPI) ADMISSION 12/20/2019 This is an 86 year old woman referred by her primary physician Dr. Felipa Eth for review of a wound on her right lateral lower leg. She was actually  in this clinic on 2 separate occasions in 2010 and 2012 cared for by Dr. Sherilyn Cooter. At that point in  time she had wounds on her right leg as well. She tells [place] to 1 month ago she noticed a scab building up on her right lateral lower leg this opened into a wound. There was no overt cause of this no trauma, no infection that she is aware of. She has a history of chronic venous insufficiency and wears compression stockings fairly religiously indeed she is done well over the last 8 years since she was last in this clinic. She has been applying Vaseline on this and a covering phone. This is not progressing towards healing. Past medical history; interstitial lung disease, chronic atrial fibrillation status post pacemaker, osteoarthritis of the left knee, left breast CA, chronic repeat venous insufficiency. She takes Eliquis for her atrial fibrillation stroke prophylaxis. ABI in our clinic was 0.99 on the right 1/15; superficial wound on the right lateral calf in the setting of severe acute skin changes from chronic venous insufficiency and lymphedema. We used Iodoflex last week she complained of a lot of pain 1/22; this is a small but difficult wound on the right lateral calf in the setting of severe chronic venous insufficiency and secondary lymphedema. She continues to state the wounds things hurts when she is up on it but seems to be relieved by putting her leg up. I changed her to [facility] last week because the Iodoflex seem to be causing stinging. 1/29. This wound appears to be contracting somewhat. Changes of chronic venous insufficiency with secondary lymphedema 2/12; not as good as surface today and slightly bigger. I had to change her from Iodoflex to [facility] because of the stinging pain although she does not think it was any better on the [facility]. ooShe comes into clinic today with a area on the medial left great toe. She said she noticed blood on her sock last Saturday. She had some form of bunion surgery by Dr. Ila Mcgill her podiatrist sometime in 2019 she said he  "shave the area". I could not find his operative report although I did see reference to the bunion in the area. 2/19; somewhat improved wound on the right lateral lower leg however the wound she came in on the bunion of her left great toe is actually I think larger still somewhat inflamed. We have been using Iodoflex 2/26; not much improvement in either wound area which is the original venous wound on the right lateral lower leg and in the area on the bunion of her left great toe. This still looks somewhat inflamed and tender. We have been using Iodoflex with open much improvement. 3/4; in general both her wounds look better this includes the original venous insufficiency wound on the right lateral lower leg and the area on her tip of her bunion on the left great toe medial aspect of the MTP. Change to Hydrofera Blue last week 3/12; the patient still has a small geographic shaped wound on the right lateral lower leg. We have been using Hydrofera Blue but I changed her to endoform today. She also has the area in the tip of the bunion of the left great toe. We will try Hydrofera Blue here as well 3/19; the small geographic wound on the right lateral lower leg has not filled in. There is some surrounding induration we have noticed this previously. This may be venous inflammation but I wonder about biopsying this if this  is not closing up. The area over the left medial first MTP [bunion deformity] requires debridement. This is not closing in. I am not convinced she is offloading adequately 3/26; right lateral lower leg in the setting of severe venous insufficiency and also an area over the first MTP bunion deformity. No debridement is required. We have been using endoform 4/9; right lateral lower leg wound in the setting of severe venous insufficiency and also a refractory area over the first MTP bunion deformity. The area on the right lateral lower leg is just about closed there is still a minor open  area here. She comes in today with a mirror image area on the left lateral lower leg. She says this started as a scab 2 weeks ago and is gradually morphed into an open wound very similar to what she has on the right leg. She has severe bilateral venous insufficiency with venous hypertension obvious from clinical exam. 4/16; her original wound the right lateral leg wound in the setting of chronic venous hypertension is a small open area but very small. We have been using endoform. Her wound over the left first MTP bunion deformity also appears to be small and closing in. Unfortunately she has a large relative area on the left lateral calf which was new last week. Very adherent debris over this we have been using Iodoflex however that may be contributing to the debris on the wound surface 4/23; the original wound is closed and the area on the left first metatarsal bunion deformity is also almost closed the new area from last week required debridement. She went for her reflux studies that did not take her lower extremity wraps off. This is not helpful. She did have significant reflux in the right common femoral vein. I am not going to press this issue further. She clinically has severe venous hypertension 4/29; the original wound is closed on the right lateral lower calf. The area on the left first metatarsal/bunion deformity has a small slitlike opening that is still not closed. The real problem here is now wound on the left lateral calf which is necrotic and deep. We used Iodoflex on this wound last time. Silver alginate on the left first toe The patient has Farrow wraps. We should be able to transition the right leg into compression stockings and were doing this today. 5/7; the original wound remains closed on the right lateral calf. The left first metatarsal head still is open. Deterioration in the wound which is the new wound from several weeks ago on the left lateral calf. The patient is not aware  how this could have happened. We have been using Sorbact starting last week under 3 layer compression 5/14; the right lateral calf is closed the area on the first met head on the left is closed However the new area from 3 weeks ago on the left lateral calf continues to expand. There is raised nodular tissue around the wound necrotic debris on the surface. Circumference of the wound also looks necrotic. It looks as though there is involvement of the tissue under the skin around the large area of this wound which looks threatened. She is complaining of pain. Culture of this wound I did last week showed rare coag negative staph variant I have never heard of although I am doubtful this has clinical significance 05/09/20-Patient returns with a left lateral calf wound looking about the same, the biopsies reviewed show no atypical features and consistent with trauma or pressure injury or  stasis change, patient has appointment to be scheduled with vascular We are using silver alginate and 3 layer compression Patient was started on Trental by dermatology which she has been taking for a week now 6/4; left lateral calf wound. I reviewed the dermatology notes with Dr. Martin Majestic from [facility] dermatology. 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 Put her on pentoxifylline noteworthy that she is on Eliquis as well. We have been using silver alginate under compression. She has an appointment for repeat vascular studies on 7/6. This gets back to the fact that they did not take the wrap off on the original studies that were done. I do not believe she has an arterial issue. Objective Constitutional Vitals Time Taken: 9:24 AM, Height: 69 in, Weight: 163 lbs, BMI: 24.1, Temperature: 97.9 F, Pulse: 67 bpm, Respiratory Rate: 18 breaths/min, Blood Pressure: 145/92 mmHg. Cardiovascular Pedal pulses are palpable. Good edema  control but severe venous hypertension. General Notes: Wound exam; left lateral calf wound looks somewhat better. Better surface. I did a reasonably aggressive debridement with a #5 curette removing necrotic callus and surface debris. This leaves the surface of the wound looking somewhat better to me. Healthy red tissue. I see no evidence of infection Integumentary (Hair, Skin) Wound #5 status is Open. Original cause of wound was Gradually Appeared. The wound is located on the Left,Lateral Lower Leg. The wound measures 3.1cm length x 2cm width x 0.2cm depth; 4.869cm^2 area and 0.974cm^3 volume. There is Fat Layer (Subcutaneous Tissue) Exposed exposed. There is no tunneling or undermining noted. There is a medium amount of serosanguineous drainage noted. The wound margin is flat and intact. There is small (1-33%) pink granulation within the wound bed. There is a large (67-100%) amount of necrotic tissue within the wound bed including Eschar and Adherent Slough. Wound #6 status is Healed - Epithelialized. Original cause of wound was Blister. The wound is located on the Right T Great. The wound measures 0cm length oe x 0cm width x 0cm depth; 0cm^2 area and 0cm^3 volume. There is no tunneling or undermining noted. There is a medium amount of serosanguineous drainage noted. There is no granulation within the wound bed. There is no necrotic tissue within the wound bed. Assessment Active Problems ICD-10 Chronic venous hypertension (idiopathic) with inflammation of right lower extremity Non-pressure chronic ulcer of other part of left lower leg with other specified severity Chronic venous hypertension (idiopathic) with inflammation of left lower extremity Non-pressure chronic ulcer of other part of left foot with fat layer exposed Procedures Wound #5 Pre-procedure diagnosis of Wound #5 is a Venous Leg Ulcer located on the Left,Lateral Lower Leg .Severity of Tissue Pre Debridement is: Fat layer  exposed. There was a Excisional Skin/Subcutaneous Tissue Debridement with a total area of 6.2 sq cm performed by Ricard Dillon., MD. With the following instrument(s): Curette Material removed includes Eschar, Subcutaneous Tissue, and Skin: Dermis after achieving pain control using Other (benzocaine, 20%). No specimens were taken. A time out was conducted at 09:50, prior to the start of the procedure. A Minimum amount of bleeding was controlled with Pressure. The procedure was tolerated well with a pain level of 0 throughout and a pain level of 0 following the procedure. Post Debridement Measurements: 3.1cm length x 2cm width x 0.2cm depth; 0.974cm^3 volume. Character of Wound/Ulcer Post Debridement requires further debridement. Severity of Tissue Post Debridement is: Fat layer exposed. Post procedure Diagnosis Wound #5:  Same as Pre-Procedure Pre-procedure diagnosis of Wound #5 is a Venous Leg Ulcer located on the Left,Lateral Lower Leg . There was a Three Layer Compression Therapy Procedure by [name], RN. Post procedure Diagnosis Wound #5: Same as Pre-Procedure Plan Follow-up Appointments: Return Appointment in 1 week. Dressing Change Frequency: Wound #5 Left,Lateral Lower Leg: Do not change entire dressing for one week. Skin Barriers/Peri-Wound Care: Moisturizing lotion - patient to lotion right leg every night. TCA Cream or Ointment - mixed with lotion to left leg Wound Cleansing: May shower with protection. - cast protector Primary Wound Dressing: Wound #5 Left,Lateral Lower Leg: Skin Substitute Application - Will run insurance for Apligraft Hydrofera Blue Secondary Dressing: Wound #5 Left,Lateral Lower Leg: Dry Gauze ABD pad Edema Control: 3 Layer Compression System - Left Lower Extremity - use kerlix instead of cotton Avoid standing for long periods of time Elevate legs to the level of the heart or above for 30 minutes daily and/or when sitting, a frequency of: -  throughout the day. Exercise regularly Support Garment 20-30 mm/Hg pressure to: - patient to apply farrow wrap 4000 to right leg. Apply in the morning and remove at night. Off-Loading: Open toe surgical shoe to: - left 1. Common things being common a severe difficult wound predominantly related to venous hypertension. She has repeat venous reflux studies on 7/6 related to not taking her wrap off at the time of her original venous insufficiency studies. 2. Clinical exam suggest this woman has severe venous hypertension 3. She does not have an arterial issue 4. I have referred her to dermatology or back to her dermatologist to consider other causes of this wound. Shave biopsy I did was negative for malignancy. Dr. Martin Majestic brought up the possibility of livedoid vasculopathy which I am not sure I would except unless we actually saw the vasculopathy on a punch biopsy. I am reluctant to do this not least of which is because the patient is on Eliquis and now pentoxifylline, but also because the wound looks somewhat better today. 5. She is on pentoxifylline I am assuming because of the vasculopathy although there is also some evidence of benefit in people with chronic venous insufficiency wounds I have asked her to continue this but cautioned if she has any difficulty with bleeding 4. I have put her in for an Apligraf through her Musician) Signed: 05/19/2020 5:22:40 PM By: Linton Ham MD Entered By: Linton Ham on 05/16/2020 10:10:59 -------------------------------------------------------------------------------- SuperBill Details Patient Name: Date of Service: [name]. 05/16/2020 Medical Record Number: [id number] Patient Account Number: [id number] Date of Birth/Sex: Treating RN: October 10, 1933 (86 y.o. [name] Primary Care Provider: Lajean Manes T Other Clinician: Referring Provider: Treating Provider/Extender: Evelena Peat  in Treatment: 21 Diagnosis Coding ICD-10 Codes Code Description [phone] Chronic venous hypertension (idiopathic) with inflammation of right lower extremity L97.828 Non-pressure chronic ulcer of other part of left lower leg with other specified severity I87.322 Chronic venous hypertension (idiopathic) with inflammation of left lower extremity L97.522 Non-pressure chronic ulcer of other part of left foot with fat layer exposed Facility Procedures Physician Procedures : CPT4 Code Description Modifier [id number] - WC PHYS SUBQ TISS 20 SQ CM ICD-10 Diagnosis Description L97.828 Non-pressure chronic ulcer of other part of left lower leg with other specified severity I87.321 Chronic venous hypertension (idiopathic)  with inflammation of right lower extremity Quantity: 1 Electronic Signature(s) Signed: 05/19/2020 5:22:40 PM By: Linton Ham MD Entered By: Linton Ham on 05/16/2020 10:11:17

## 2020-05-19 NOTE — Progress Notes (Signed)
RETTA, PITCHER ([id number]) Visit Report for 05/16/2020 Arrival Information Details Patient Name: Date of Service: [name], [name] 05/16/2020 9:15 A M Medical Record Number: [id number] Patient Account Number: [id number] Date of Birth/Sex: Treating RN: 1933-08-07 (86 y.o. [name] Primary Care Syna Gad: Lajean Manes T Other Clinician: Referring Randal Goens: Treating Matalie Romberger/Extender: Evelena Peat in Treatment: 21 Visit Information History Since Last Visit Added or deleted any medications: No Patient Arrived: Ambulatory Any new allergies or adverse reactions: No Arrival Time: 09:21 Had a fall or experienced change in No Accompanied By: alone activities of daily living that may affect Transfer Assistance: None risk of falls: Patient Identification Verified: Yes Signs or symptoms of abuse/neglect since last visito No Secondary Verification Process Completed: Yes Hospitalized since last visit: No Patient Requires Transmission-Based Precautions: No Implantable device outside of the clinic excluding No Patient Has Alerts: Yes cellular tissue based products placed in the center Patient Alerts: Patient on Blood Thinner since last visit: Right ABI:0.99 Has Dressing in Place as Prescribed: Yes Has Compression in Place as Prescribed: Yes Pain Present Now: Yes Electronic Signature(s) Signed: 05/19/2020 6:28:13 PM By: [name] RN, BSN Entered By: [name] on 05/16/2020 09:24:53 -------------------------------------------------------------------------------- Compression Therapy Details Patient Name: Date of Service: [name]. 05/16/2020 9:15 A M Medical Record Number: [id number] Patient Account Number: [id number] Date of Birth/Sex: Treating RN: 11/02/1933 (86 y.o. [name] Primary Care Astou Lada: Patrina Levering Other Clinician: Referring Mabrey Howland: Treating Sayid Moll/Extender: Evelena Peat in  Treatment: 21 Compression Therapy Performed for Wound Assessment: Wound #5 Left,Lateral Lower Leg Performed By: Clinician [name], RN Compression Type: Three Layer Post Procedure Diagnosis Same as Pre-procedure Electronic Signature(s) Signed: 05/16/2020 4:23:17 PM By: Kela Millin Entered By: Kela Millin on 05/16/2020 09:45:29 -------------------------------------------------------------------------------- Encounter Discharge Information Details Patient Name: Date of Service: [name]. 05/16/2020 9:15 A M Medical Record Number: [id number] Patient Account Number: [id number] Date of Birth/Sex: Treating RN: [birth year] (85 y.o. [name] Primary Care Noach Calvillo: Patrina Levering Other Clinician: Referring Sona Nations: Treating Britton Bera/Extender: Evelena Peat in Treatment: 21 Encounter Discharge Information Items Post Procedure Vitals Discharge Condition: Stable Temperature (F): 97.9 Ambulatory Status: Ambulatory Pulse (bpm): 67 Discharge Destination: Home Respiratory Rate (breaths/min): 18 Transportation: Private Auto Blood Pressure (mmHg): 145/92 Accompanied By: husband Schedule Follow-up Appointment: Yes Clinical Summary of Care: Patient Declined Electronic Signature(s) Signed: 05/19/2020 6:28:13 PM By: [name] RN, BSN Entered By: [name] on 05/16/2020 13:34:14 -------------------------------------------------------------------------------- Lower Extremity Assessment Details Patient Name: Date of Service: [name]. 05/16/2020 9:15 A M Medical Record Number: [id number] Patient Account Number: [id number] Date of Birth/Sex: Treating RN: 06-Feb-1933 (86 y.o. [name] Primary Care Monigue Spraggins: Lajean Manes T Other Clinician: Referring Ganon Demasi: Treating Ruble Buttler/Extender: [name] in Treatment: 21 Edema Assessment Assessed: [Left: No] [Right: No] Edema: [Left: No]  [Right: No] Calf Left: Right: Point of Measurement: 42 cm From Medial Instep 30 cm cm Ankle Left: Right: Point of Measurement: 13 cm From Medial Instep 21 cm cm Vascular Assessment Pulses: Dorsalis Pedis Palpable: [Left:Yes] Electronic Signature(s) Signed: 05/19/2020 6:28:13 PM By: [name] RN, BSN Entered By: [name] on 05/16/2020 09:35:38 -------------------------------------------------------------------------------- Multi Wound Chart Details Patient Name: Date of Service: [name]. 05/16/2020 9:15 A M Medical Record Number: [id number] Patient Account Number: [id number] Date of Birth/Sex: Treating RN: 1933/10/24 (86 y.o. [name] Primary Care Talley Casco: Patrina Levering Other Clinician:  Referring Takeisha Cianci: Treating Kyriakos Babler/Extender: Venita Lick T Weeks in Treatment: 21 Vital Signs Height(in): 69 Pulse(bpm): 67 Weight(lbs): 163 Blood Pressure(mmHg): 145/92 Body Mass Index(BMI): 24 Temperature(F): 97.9 Respiratory Rate(breaths/min): 18 Photos: [5:No Photos Left, Lateral Lower Leg] [6:No Photos Right T Great oe] [N/A:N/A N/A] Wound Location: [5:Gradually Appeared] [6:Blister] [N/A:N/A] Wounding Event: [5:Venous Leg Ulcer] [6:Trauma, Other] [N/A:N/A] Primary Etiology: [5:Cataracts, Anemia, Arrhythmia,] [6:Cataracts, Anemia, Arrhythmia,] [N/A:N/A] Comorbid History: [5:Congestive Heart Failure, Hypertension, Peripheral Venous Disease, Received Radiation 03/21/2020] [6:Congestive Heart Failure, Hypertension, Peripheral Venous Disease, Received Radiation 04/30/2020] [N/A:N/A] Date Acquired: [5:8] [6:1] [N/A:N/A] Weeks of Treatment: [5:Open] [6:Healed - Epithelialized] [N/A:N/A] Wound Status: [5:3.1x2x0.2] [6:0x0x0] [N/A:N/A] Measurements L x W x D (cm) [5:4.869] [6:0] [N/A:N/A] A (cm) : rea [5:0.974] [6:0] [N/A:N/A] Volume (cm) : [5:-868.00%] [6:100.00%] [N/A:N/A] % Reduction in A [5:rea: -1848.00%] [6:100.00%] [N/A:N/A] %  Reduction in Volume: [5:Full Thickness Without Exposed] [6:Full Thickness Without Exposed] [N/A:N/A] Classification: [5:Support Structures Medium] [6:Support Structures Medium] [N/A:N/A] Exudate A mount: [5:Serosanguineous] [6:Serosanguineous] [N/A:N/A] Exudate Type: [5:red, brown] [6:red, brown] [N/A:N/A] Exudate Color: [5:Flat and Intact] [6:N/A] [N/A:N/A] Wound Margin: [5:Small (1-33%)] [6:None Present (0%)] [N/A:N/A] Granulation A mount: [5:Pink] [6:N/A] [N/A:N/A] Granulation Quality: [5:Large (67-100%)] [6:None Present (0%)] [N/A:N/A] Necrotic A mount: [5:Eschar, Adherent Slough] [6:N/A] [N/A:N/A] Necrotic Tissue: [5:Fat Layer (Subcutaneous Tissue)] [6:Fascia: No] [N/A:N/A] Exposed Structures: [5:Exposed: Yes Fascia: No Tendon: No Muscle: No Joint: No Bone: No Small (1-33%)] [6:Fat Layer (Subcutaneous Tissue) Exposed: No Tendon: No Muscle: No Joint: No Bone: No Large (67-100%)] [N/A:N/A] Epithelialization: [5:Debridement - Excisional] [6:N/A] [N/A:N/A] Debridement: Pre-procedure Verification/Time Out 09:50 [6:N/A] [N/A:N/A] Taken: [5:Other] [6:N/A] [N/A:N/A] Pain Control: [5:Necrotic/Eschar, Subcutaneous] [6:N/A] [N/A:N/A] Tissue Debrided: [5:Skin/Subcutaneous Tissue] [6:N/A] [N/A:N/A] Level: [5:6.2] [6:N/A] [N/A:N/A] Debridement A (sq cm): [5:rea Curette] [6:N/A] [N/A:N/A] Instrument: [5:Minimum] [6:N/A] [N/A:N/A] Bleeding: [5:Pressure] [6:N/A] [N/A:N/A] Hemostasis A chieved: [5:0] [6:N/A] [N/A:N/A] Procedural Pain: [5:0] [6:N/A] [N/A:N/A] Post Procedural Pain: [5:Procedure was tolerated well] [6:N/A] [N/A:N/A] Debridement Treatment Response: [5:3.1x2x0.2] [6:N/A] [N/A:N/A] Post Debridement Measurements L x W x D (cm) [5:0.974] [6:N/A] [N/A:N/A] Post Debridement Volume: (cm) [5:Compression Therapy] [6:N/A] [N/A:N/A] Procedures Performed: [5:Debridement] Treatment Notes Electronic Signature(s) Signed: 05/16/2020 4:23:17 PM By: Kela Millin Signed: 05/19/2020 5:22:40 PM By:  Linton Ham MD Entered By: Linton Ham on 05/16/2020 10:04:47 -------------------------------------------------------------------------------- Multi-Disciplinary Care Plan Details Patient Name: Date of Service: [name], [place]. 05/16/2020 9:15 A M Medical Record Number: [id number] Patient Account Number: [id number] Date of Birth/Sex: Treating RN: 1932/12/27 (86 y.o. [name] Primary Care Atanacio Melnyk: Lajean Manes T Other Clinician: Referring Kataryna Mcquilkin: Treating Chanler Mendonca/Extender: Evelena Peat in Treatment: 21 Active Inactive Abuse / Safety / Falls / Self Care Management Nursing Diagnoses: Potential for falls Goals: Patient/caregiver will verbalize understanding of skin care regimen Date Initiated: 12/20/2019 Date Inactivated: 02/14/2020 Target Resolution Date: 02/22/2020 Goal Status: Met Patient/caregiver will verbalize/demonstrate understanding of what to do in case of emergency Date Initiated: 12/20/2019 Target Resolution Date: 05/30/2020 Goal Status: Active Interventions: Assess fall risk on admission and as needed Provide education on fall prevention Notes: Pain, Acute or Chronic Nursing Diagnoses: Pain, acute or chronic: actual or potential Potential alteration in comfort, pain Goals: Patient will verbalize adequate pain control and receive pain control interventions during procedures as needed Date Initiated: 12/20/2019 Date Inactivated: 02/14/2020 Target Resolution Date: 02/22/2020 Goal Status: Met Patient/caregiver will verbalize comfort level met Date Initiated: 12/20/2019 Target Resolution Date: 05/30/2020 Goal Status: Active Interventions: Complete pain assessment as per visit requirements Provide education on pain management Notes: Wound/Skin Impairment Nursing Diagnoses: Knowledge deficit related to  ulceration/compromised skin integrity Goals: Patient/caregiver will verbalize understanding of skin care regimen Date  Initiated: 12/20/2019 Target Resolution Date: 05/30/2020 Goal Status: Active Interventions: Assess patient/caregiver ability to perform ulcer/skin care regimen upon admission and as needed Provide education on ulcer and skin care Treatment Activities: Skin care regimen initiated : 12/20/2019 Topical wound management initiated : 12/20/2019 Notes: Electronic Signature(s) Signed: 05/16/2020 4:23:17 PM By: Kela Millin Entered By: Kela Millin on 05/16/2020 09:29:00 -------------------------------------------------------------------------------- Pain Assessment Details Patient Name: Date of Service: [name]. 05/16/2020 9:15 A M Medical Record Number: [id number] Patient Account Number: [id number] Date of Birth/Sex: Treating RN: May 26, 1933 (86 y.o. [name] Primary Care Palyn Scrima: Patrina Levering Other Clinician: Referring Georgios Kina: Treating Brady Schiller/Extender: Evelena Peat in Treatment: 21 Active Problems Location of Pain Severity and Description of Pain Patient Has Paino Yes Site Locations Pain Location: Pain in Ulcers With Dressing Change: Yes Duration of the Pain. Constant / Intermittento Intermittent Rate the pain. Current Pain Level: 3 Character of Pain Describe the Pain: Burning Pain Management and Medication Current Pain Management: Medication: Yes Cold Application: No Rest: No Massage: No Activity: No T.E.N.S.: No Heat Application: No Leg drop or elevation: No Is the Current Pain Management Adequate: Adequate How does your wound impact your activities of daily livingo Sleep: No Bathing: No Appetite: No Relationship With Others: No Bladder Continence: No Emotions: No Bowel Continence: No Work: No Toileting: No Drive: No Dressing: No Hobbies: No Electronic Signature(s) Signed: 05/19/2020 6:28:13 PM By: [name] RN, BSN Entered By: [name] on 05/16/2020  09:25:38 -------------------------------------------------------------------------------- Patient/Caregiver Education Details Patient Name: Date of Service: Schnapp, [name] 6/4/2021andnbsp9:15 A M Medical Record Number: [id number] Patient Account Number: [id number] Date of Birth/Gender: Treating RN: December 25, 1932 (86 y.o. [name] Primary Care Physician: Lajean Manes T Other Clinician: Referring Physician: Treating Physician/Extender: Evelena Peat in Treatment: 21 Education Assessment Education Provided To: Patient Education Topics Provided Pain: Handouts: A Guide to Pain Control Methods: Demonstration Responses: State content correctly Safety: Handouts: Medication Safety Methods: Explain/Verbal Responses: State content correctly Wound/Skin Impairment: Handouts: Caring for Your Ulcer Methods: Explain/Verbal Responses: State content correctly Electronic Signature(s) Signed: 05/16/2020 4:23:17 PM By: Kela Millin Entered By: Kela Millin on 05/16/2020 09:29:23 -------------------------------------------------------------------------------- Wound Assessment Details Patient Name: Date of Service: [name]. 05/16/2020 9:15 A M Medical Record Number: [id number] Patient Account Number: [id number] Date of Birth/Sex: Treating RN: 1933-01-11 (86 y.o. [name] Primary Care Akiyah Eppolito: Lajean Manes T Other Clinician: Referring Iann Rodier: Treating Dayra Rapley/Extender: [name] in Treatment: 21 Wound Status Wound Number: 5 Primary Venous Leg Ulcer Etiology: Wound Location: Left, Lateral Lower Leg Wound Open Wounding Event: Gradually Appeared Status: Date Acquired: 03/21/2020 Comorbid Cataracts, Anemia, Arrhythmia, Congestive Heart Failure, Weeks Of Treatment: 8 History: Hypertension, Peripheral Venous Disease, Received Radiation Clustered Wound: No Wound Measurements Length: (cm)  3.1 Width: (cm) 2 Depth: (cm) 0.2 Area: (cm) 4.869 Volume: (cm) 0.974 % Reduction in Area: -868% % Reduction in Volume: -1848% Epithelialization: Small (1-33%) Tunneling: No Undermining: No Wound Description Classification: Full Thickness Without Exposed Support Structures Wound Margin: Flat and Intact Exudate Amount: Medium Exudate Type: Serosanguineous Exudate Color: red, brown Foul Odor After Cleansing: No Slough/Fibrino Yes Wound Bed Granulation Amount: Small (1-33%) Exposed Structure Granulation Quality: Pink Fascia Exposed: No Necrotic Amount: Large (67-100%) Fat Layer (Subcutaneous Tissue) Exposed: Yes Necrotic Quality: Eschar, Adherent Slough Tendon Exposed: No Muscle Exposed: No Joint Exposed: No Bone Exposed: No Treatment  Notes Wound #5 (Left, Lateral Lower Leg) 1. Cleanse With Soap and water 2. Periwound Care Moisturizing lotion TCA Cream 3. Primary Dressing Applied Hydrofera Blue 4. Secondary Dressing ABD Pad Dry Gauze 6. Support Layer Applied 3 layer compression wrap Notes netting. Electronic Signature(s) Signed: 05/19/2020 6:28:13 PM By: [name] RN, BSN Entered By: [name] on 05/16/2020 09:37:27 -------------------------------------------------------------------------------- Wound Assessment Details Patient Name: Date of Service: [name]. 05/16/2020 9:15 A M Medical Record Number: [id number] Patient Account Number: [id number] Date of Birth/Sex: Treating RN: 12-20-1932 (86 y.o. [name] Primary Care Glennys Schorsch: Lajean Manes T Other Clinician: Referring Maizie Garno: Treating Sundiata Ferrick/Extender: [name] in Treatment: 21 Wound Status Wound Number: 6 Primary Trauma, Other Etiology: Wound Location: Right T Great oe Wound Healed - Epithelialized Wounding Event: Blister Status: Date Acquired: 04/30/2020 Comorbid Cataracts, Anemia, Arrhythmia, Congestive Heart Failure, Weeks Of Treatment:  1 History: Hypertension, Peripheral Venous Disease, Received Radiation Clustered Wound: No Wound Measurements Length: (cm) Width: (cm) Depth: (cm) Area: (cm) Volume: (cm) 0 % Reduction in Area: 100% 0 % Reduction in Volume: 100% 0 Epithelialization: Large (67-100%) 0 Tunneling: No 0 Undermining: No Wound Description Classification: Full Thickness Without Exposed Support Structures Exudate Amount: Medium Exudate Type: Serosanguineous Exudate Color: red, brown Foul Odor After Cleansing: No Slough/Fibrino No Wound Bed Granulation Amount: None Present (0%) Exposed Structure Necrotic Amount: None Present (0%) Fascia Exposed: No Fat Layer (Subcutaneous Tissue) Exposed: No Tendon Exposed: No Muscle Exposed: No Joint Exposed: No Bone Exposed: No Electronic Signature(s) Signed: 05/19/2020 6:28:13 PM By: [name] RN, BSN Entered By: [name] on 05/16/2020 09:37:41 -------------------------------------------------------------------------------- [place] Details Patient Name: Date of Service: Tracy Banker J. 05/16/2020 9:15 A M Medical Record Number: [id number] Patient Account Number: [id number] Date of Birth/Sex: Treating RN: 1933-08-05 (86 y.o. [name] Primary Care Kloee Ballew: Lajean Manes T Other Clinician: Referring Mariely Mahr: Treating Onesha Krebbs/Extender: Evelena Peat in Treatment: 21 Vital Signs Time Taken: 09:24 Temperature (F): 97.9 Height (in): 69 Pulse (bpm): 67 Weight (lbs): 163 Respiratory Rate (breaths/min): 18 Body Mass Index (BMI): 24.1 Blood Pressure (mmHg): 145/92 Reference Range: 80 - 120 mg / dl Electronic Signature(s) Signed: 05/19/2020 6:28:13 PM By: [name] RN, BSN Entered By: [name] on 05/16/2020 09:25:17

## 2020-05-22 ENCOUNTER — Other Ambulatory Visit: Payer: Self-pay

## 2020-05-22 ENCOUNTER — Encounter (HOSPITAL_BASED_OUTPATIENT_CLINIC_OR_DEPARTMENT_OTHER): Payer: Medicare Other | Admitting: Internal Medicine

## 2020-05-22 DIAGNOSIS — I87333 Chronic venous hypertension (idiopathic) with ulcer and inflammation of bilateral lower extremity: Secondary | ICD-10-CM | POA: Diagnosis not present

## 2020-05-26 NOTE — Progress Notes (Signed)
[name], [name] ([id number]) Visit Report for 05/22/2020 Debridement Details Patient Name: Date of Service: [place], [name] 05/22/2020 8:15 A M Medical Record Number: [id number] Patient Account Number: [id number] Date of Birth/Sex: Treating RN: Oct 30, 1933 (86 y.o. [name] Primary Care Provider: Lajean Manes T Other Clinician: Referring Provider: Treating Provider/Extender: Evelena Peat in Treatment: 22 Debridement Performed for Assessment: Wound #5 Left,Lateral Lower Leg Performed By: Physician Ricard Dillon., MD Debridement Type: Debridement Severity of Tissue Pre Debridement: Fat layer exposed Level of Consciousness (Pre-procedure): Awake and Alert Pre-procedure Verification/Time Out Yes - 08:55 Taken: Start Time: 08:56 Pain Control: Lidocaine 4% T opical Solution T Area Debrided (L x W): otal 3.2 (cm) x 2.1 (cm) = 6.72 (cm) Tissue and other material debrided: Viable, Non-Viable, Slough, Subcutaneous, Skin: Dermis , Fibrin/Exudate, Slough Level: Skin/Subcutaneous Tissue Debridement Description: Excisional Instrument: Curette Bleeding: Moderate Hemostasis Achieved: Pressure End Time: 09:05 Procedural Pain: 0 Post Procedural Pain: 2 Response to Treatment: Procedure was tolerated well Level of Consciousness (Post- Awake and Alert procedure): Post Debridement Measurements of Total Wound Length: (cm) 3.2 Width: (cm) 2.1 Depth: (cm) 0.2 Volume: (cm) 1.056 Character of Wound/Ulcer Post Debridement: Improved Severity of Tissue Post Debridement: Fat layer exposed Post Procedure Diagnosis Same as Pre-procedure Electronic Signature(s) Signed: 05/22/2020 5:25:48 PM By: Deon Pilling Signed: 05/26/2020 8:32:12 AM By: Linton Ham MD Entered By: Linton Ham on 05/22/2020 09:08:44 -------------------------------------------------------------------------------- HPI Details Patient Name: Date of Service: Tracy Banker J. 05/22/2020  8:15 A M Medical Record Number: [id number] Patient Account Number: [id number] Date of Birth/Sex: Treating RN: 1933/02/27 (86 y.o. [name] Primary Care Provider: Patrina Levering Other Clinician: Referring Provider: Treating Provider/Extender: Evelena Peat in Treatment: 22 History of Present Illness HPI Description: ADMISSION 12/20/2019 This is an 86 year old woman referred by her primary physician Dr. Felipa Eth for review of a wound on her right lateral lower leg. She was actually in this clinic on 2 separate occasions in 2010 and 2012 cared for by Dr. Sherilyn Cooter. At that point in time she had wounds on her right leg as well. She tells [place] to 1 month ago she noticed a scab building up on her right lateral lower leg this opened into a wound. There was no overt cause of this no trauma, no infection that she is aware of. She has a history of chronic venous insufficiency and wears compression stockings fairly religiously indeed she is done well over the last 8 years since she was last in this clinic. She has been applying Vaseline on this and a covering phone. This is not progressing towards healing. Past medical history; interstitial lung disease, chronic atrial fibrillation status post pacemaker, osteoarthritis of the left knee, left breast CA, chronic repeat venous insufficiency. She takes Eliquis for her atrial fibrillation stroke prophylaxis. ABI in our clinic was 0.99 on the right 1/15; superficial wound on the right lateral calf in the setting of severe acute skin changes from chronic venous insufficiency and lymphedema. We used Iodoflex last week she complained of a lot of pain 1/22; this is a small but difficult wound on the right lateral calf in the setting of severe chronic venous insufficiency and secondary lymphedema. She continues to state the wounds things hurts when she is up on it but seems to be relieved by putting her leg up. I changed her to [facility] last week because the Iodoflex seem to be causing stinging. 1/29. This wound appears to be contracting  somewhat. Changes of chronic venous insufficiency with secondary lymphedema 2/12; not as good as surface today and slightly bigger. I had to change her from Iodoflex to [facility] because of the stinging pain although she does not think it was any better on the [facility]. She comes into clinic today with a area on the medial left great toe. She said she noticed blood on her sock last Saturday. She had some form of bunion surgery by Dr. Ila Mcgill her podiatrist sometime in 2019 she said he "shave the area". I could not find his operative report although I did see reference to the bunion in the area. 2/19; somewhat improved wound on the right lateral lower leg however the wound she came in on the bunion of her left great toe is actually I think larger still somewhat inflamed. We have been using Iodoflex 2/26; not much improvement in either wound area which is the original venous wound on the right lateral lower leg and in the area on the bunion of her left great toe. This still looks somewhat inflamed and tender. We have been using Iodoflex with open much improvement. 3/4; in general both her wounds look better this includes the original venous insufficiency wound on the right lateral lower leg and the area on her tip of her bunion on the left great toe medial aspect of the MTP. Change to Hydrofera Blue last week 3/12; the patient still has a small geographic shaped wound on the right lateral lower leg. We have been using Hydrofera Blue but I changed her to endoform today. She also has the area in the tip of the bunion of the left great toe. We will try Hydrofera Blue here as well 3/19; the small geographic wound on the right lateral lower leg has not filled in. There is some surrounding induration we have noticed this previously. This may be venous inflammation but I wonder about  biopsying this if this is not closing up. The area over the left medial first MTP [bunion deformity] requires debridement. This is not closing in. I am not convinced she is offloading adequately 3/26; right lateral lower leg in the setting of severe venous insufficiency and also an area over the first MTP bunion deformity. No debridement is required. We have been using endoform 4/9; right lateral lower leg wound in the setting of severe venous insufficiency and also a refractory area over the first MTP bunion deformity. The area on the right lateral lower leg is just about closed there is still a minor open area here. She comes in today with a mirror image area on the left lateral lower leg. She says this started as a scab 2 weeks ago and is gradually morphed into an open wound very similar to what she has on the right leg. She has severe bilateral venous insufficiency with venous hypertension obvious from clinical exam. 4/16; her original wound the right lateral leg wound in the setting of chronic venous hypertension is a small open area but very small. We have been using endoform. Her wound over the left first MTP bunion deformity also appears to be small and closing in. Unfortunately she has a large relative area on the left lateral calf which was new last week. Very adherent debris over this we have been using Iodoflex however that may be contributing to the debris on the wound surface 4/23; the original wound is closed and the area on the left first metatarsal bunion deformity is also almost closed the  new area from last week required debridement. She went for her reflux studies that did not take her lower extremity wraps off. This is not helpful. She did have significant reflux in the right common femoral vein. I am not going to press this issue further. She clinically has severe venous hypertension 4/29; the original wound is closed on the right lateral lower calf. The area on the left first  metatarsal/bunion deformity has a small slitlike opening that is still not closed. The real problem here is now wound on the left lateral calf which is necrotic and deep. We used Iodoflex on this wound last time. Silver alginate on the left first toe The patient has Farrow wraps. We should be able to transition the right leg into compression stockings and were doing this today. 5/7; the original wound remains closed on the right lateral calf. The left first metatarsal head still is open. Deterioration in the wound which is the new wound from several weeks ago on the left lateral calf. The patient is not aware how this could have happened. We have been using Sorbact starting last week under 3 layer compression 5/14; the right lateral calf is closed the area on the first met head on the left is closed However the new area from 3 weeks ago on the left lateral calf continues to expand. There is raised nodular tissue around the wound necrotic debris on the surface. Circumference of the wound also looks necrotic. It looks as though there is involvement of the tissue under the skin around the large area of this wound which looks threatened. She is complaining of pain. Culture of this wound I did last week showed rare coag negative staph variant I have never heard of although I am doubtful this has clinical significance 05/09/20-Patient returns with a left lateral calf wound looking about the same, the biopsies reviewed show no atypical features and consistent with trauma or pressure injury or stasis change, patient has appointment to be scheduled with vascular We are using silver alginate and 3 layer compression Patient was started on Trental by dermatology which she has been taking for a week now 6/4; left lateral calf wound. I reviewed the dermatology notes with Dr. Martin Majestic from [facility] dermatology. See suggested the possibility of livedoid vasculopathy possibility of additional biopsies which I think  would have to be punch biopsies. Put her on pentoxifylline noteworthy that she is on Eliquis as well. We have been using silver alginate under compression. She has an appointment for repeat vascular studies on 7/6. This gets back to the fact that they did not take the wrap off on the original studies that were done. I do not believe she has an arterial issue. 6/10; left lateral calf wound. I put her on Hydrofera Blue last week. Some improvement in the surface condition of the wound. She has repeat vascular studies/reflux studies on 7/6. We are trying to get Apligraf through [place] [facility]. Her husband states he looked on the website this is not going to be approved. I am really not sure if this is a "class effect" Electronic Signature(s) Signed: 05/26/2020 8:32:12 AM By: Linton Ham MD Entered By: Linton Ham on 05/22/2020 09:09:51 -------------------------------------------------------------------------------- Physical Exam Details Patient Name: Date of Service: [name]. 05/22/2020 8:15 A M Medical Record Number: [id number] Patient Account Number: [id number] Date of Birth/Sex: Treating RN: 08-02-1933 (86 y.o. [name] Primary Care Provider: Patrina Levering Other Clinician: Referring Provider: Treating Provider/Extender: Jacqlyn Larsen  Weeks in Treatment: 22 Constitutional Sitting or standing Blood Pressure is within target range for patient.. Pulse regular and within target range for patient.Marland Kitchen Respirations regular, non-labored and within target range.. Temperature is normal and within the target range for the patient.Marland Kitchen Appears in no distress. Notes Wound exam; left lateral calf wound. Somewhat better less adherent debris. Still using a #5 curette to remove very adherent necrotic surfaces. I think we are making progress albeit slowly. No evidence of infection Electronic Signature(s) Signed: 05/26/2020 8:32:12 AM By: Linton Ham MD Entered  By: Linton Ham on 05/22/2020 09:10:59 -------------------------------------------------------------------------------- Physician Orders Details Patient Name: Date of Service: [name]. 05/22/2020 8:15 A M Medical Record Number: [id number] Patient Account Number: [id number] Date of Birth/Sex: Treating RN: 1933-12-08 (85 y.o. [name] Primary Care Provider: Lajean Manes T Other Clinician: Referring Provider: Treating Provider/Extender: Evelena Peat in Treatment: 22 Verbal / Phone Orders: No Diagnosis Coding ICD-10 Coding Code Description I87.321 Chronic venous hypertension (idiopathic) with inflammation of right lower extremity L97.828 Non-pressure chronic ulcer of other part of left lower leg with other specified severity I87.322 Chronic venous hypertension (idiopathic) with inflammation of left lower extremity L97.522 Non-pressure chronic ulcer of other part of left foot with fat layer exposed Follow-up Appointments Return Appointment in 1 week. Dressing Change Frequency Wound #5 Left,Lateral Lower Leg Do not change entire dressing for one week. Skin Barriers/Peri-Wound Care Moisturizing lotion - patient to lotion right leg every night. TCA Cream or Ointment - mixed with lotion to left leg Wound Cleansing May shower with protection. - cast protector Primary Wound Dressing Wound #5 Left,Lateral Lower Leg Hydrofera Blue Secondary Dressing Wound #5 Left,Lateral Lower Leg Dry Gauze ABD pad Edema Control 3 Layer Compression System - Left Lower Extremity - use kerlix instead of cotton Avoid standing for long periods of time Elevate legs to the level of the heart or above for 30 minutes daily and/or when sitting, a frequency of: - throughout the day. Exercise regularly Support Garment 20-30 mm/Hg pressure to: - patient to apply farrow wrap 4000 to right leg. Apply in the morning and remove at night. Off-Loading Open toe surgical shoe  to: - left Additional Orders / Instructions Other: - When you have your venous study on 06/17/2020 ensure to remove compression wrap before going into appointment to ensure you have the entire test completed. Call wound center for a nurse visit for rewrapping. Electronic Signature(s) Signed: 05/22/2020 5:25:48 PM By: Deon Pilling Signed: 05/26/2020 8:32:12 AM By: Linton Ham MD Entered By: Deon Pilling on 05/22/2020 09:04:36 -------------------------------------------------------------------------------- Problem List Details Patient Name: Date of Service: [place], [place]. 05/22/2020 8:15 A M Medical Record Number: [id number] Patient Account Number: [id number] Date of Birth/Sex: Treating RN: [birth year] (86 y.o. [name] Primary Care Provider: Lajean Manes T Other Clinician: Referring Provider: Treating Provider/Extender: Evelena Peat in Treatment: 22 Active Problems ICD-10 Encounter Code Description Active Date MDM Diagnosis I87.321 Chronic venous hypertension (idiopathic) with inflammation of right lower 12/20/2019 No Yes extremity L97.828 Non-pressure chronic ulcer of other part of left lower leg with other specified 03/21/2020 No Yes severity I87.322 Chronic venous hypertension (idiopathic) with inflammation of left lower 02/14/2020 No Yes extremity L97.522 Non-pressure chronic ulcer of other part of left foot with fat layer exposed 01/25/2020 No Yes Inactive Problems ICD-10 Code Description Active Date Inactive Date L97.812 Non-pressure chronic ulcer of other part of right lower leg with fat layer exposed 12/20/2019 12/20/2019 Resolved Problems Electronic  Signature(s) Signed: 05/26/2020 8:32:12 AM By: Linton Ham MD Entered By: Linton Ham on 05/22/2020 09:08:03 -------------------------------------------------------------------------------- Progress Note Details Patient Name: Date of Service: [name]. 05/22/2020 8:15 A  M Medical Record Number: [id number] Patient Account Number: [id number] Date of Birth/Sex: Treating RN: 07-07-1933 (86 y.o. [name] Primary Care Provider: Patrina Levering Other Clinician: Referring Provider: Treating Provider/Extender: Evelena Peat in Treatment: 22 Subjective History of Present Illness (HPI) ADMISSION 12/20/2019 This is an 86 year old woman referred by her primary physician Dr. Felipa Eth for review of a wound on her right lateral lower leg. She was actually in this clinic on 2 separate occasions in 2010 and 2012 cared for by Dr. Sherilyn Cooter. At that point in time she had wounds on her right leg as well. She tells [place] to 1 month ago she noticed a scab building up on her right lateral lower leg this opened into a wound. There was no overt cause of this no trauma, no infection that she is aware of. She has a history of chronic venous insufficiency and wears compression stockings fairly religiously indeed she is done well over the last 8 years since she was last in this clinic. She has been applying Vaseline on this and a covering phone. This is not progressing towards healing. Past medical history; interstitial lung disease, chronic atrial fibrillation status post pacemaker, osteoarthritis of the left knee, left breast CA, chronic repeat venous insufficiency. She takes Eliquis for her atrial fibrillation stroke prophylaxis. ABI in our clinic was 0.99 on the right 1/15; superficial wound on the right lateral calf in the setting of severe acute skin changes from chronic venous insufficiency and lymphedema. We used Iodoflex last week she complained of a lot of pain 1/22; this is a small but difficult wound on the right lateral calf in the setting of severe chronic venous insufficiency and secondary lymphedema. She continues to state the wounds things hurts when she is up on it but seems to be relieved by putting her leg up. I changed her to [facility]  last week because the Iodoflex seem to be causing stinging. 1/29. This wound appears to be contracting somewhat. Changes of chronic venous insufficiency with secondary lymphedema 2/12; not as good as surface today and slightly bigger. I had to change her from Iodoflex to [facility] because of the stinging pain although she does not think it was any better on the [facility]. ooShe comes into clinic today with a area on the medial left great toe. She said she noticed blood on her sock last Saturday. She had some form of bunion surgery by Dr. Ila Mcgill her podiatrist sometime in 2019 she said he "shave the area". I could not find his operative report although I did see reference to the bunion in the area. 2/19; somewhat improved wound on the right lateral lower leg however the wound she came in on the bunion of her left great toe is actually I think larger still somewhat inflamed. We have been using Iodoflex 2/26; not much improvement in either wound area which is the original venous wound on the right lateral lower leg and in the area on the bunion of her left great toe. This still looks somewhat inflamed and tender. We have been using Iodoflex with open much improvement. 3/4; in general both her wounds look better this includes the original venous insufficiency wound on the right lateral lower leg and the area on her tip of her bunion  on the left great toe medial aspect of the MTP. Change to Hydrofera Blue last week 3/12; the patient still has a small geographic shaped wound on the right lateral lower leg. We have been using Hydrofera Blue but I changed her to endoform today. She also has the area in the tip of the bunion of the left great toe. We will try Hydrofera Blue here as well 3/19; the small geographic wound on the right lateral lower leg has not filled in. There is some surrounding induration we have noticed this previously. This may be venous inflammation but I wonder about  biopsying this if this is not closing up. The area over the left medial first MTP [bunion deformity] requires debridement. This is not closing in. I am not convinced she is offloading adequately 3/26; right lateral lower leg in the setting of severe venous insufficiency and also an area over the first MTP bunion deformity. No debridement is required. We have been using endoform 4/9; right lateral lower leg wound in the setting of severe venous insufficiency and also a refractory area over the first MTP bunion deformity. The area on the right lateral lower leg is just about closed there is still a minor open area here. She comes in today with a mirror image area on the left lateral lower leg. She says this started as a scab 2 weeks ago and is gradually morphed into an open wound very similar to what she has on the right leg. She has severe bilateral venous insufficiency with venous hypertension obvious from clinical exam. 4/16; her original wound the right lateral leg wound in the setting of chronic venous hypertension is a small open area but very small. We have been using endoform. Her wound over the left first MTP bunion deformity also appears to be small and closing in. Unfortunately she has a large relative area on the left lateral calf which was new last week. Very adherent debris over this we have been using Iodoflex however that may be contributing to the debris on the wound surface 4/23; the original wound is closed and the area on the left first metatarsal bunion deformity is also almost closed the new area from last week required debridement. She went for her reflux studies that did not take her lower extremity wraps off. This is not helpful. She did have significant reflux in the right common femoral vein. I am not going to press this issue further. She clinically has severe venous hypertension 4/29; the original wound is closed on the right lateral lower calf. The area on the left first  metatarsal/bunion deformity has a small slitlike opening that is still not closed. The real problem here is now wound on the left lateral calf which is necrotic and deep. We used Iodoflex on this wound last time. Silver alginate on the left first toe The patient has Farrow wraps. We should be able to transition the right leg into compression stockings and were doing this today. 5/7; the original wound remains closed on the right lateral calf. The left first metatarsal head still is open. Deterioration in the wound which is the new wound from several weeks ago on the left lateral calf. The patient is not aware how this could have happened. We have been using Sorbact starting last week under 3 layer compression 5/14; the right lateral calf is closed the area on the first met head on the left is closed However the new area from 3 weeks ago on the left  lateral calf continues to expand. There is raised nodular tissue around the wound necrotic debris on the surface. Circumference of the wound also looks necrotic. It looks as though there is involvement of the tissue under the skin around the large area of this wound which looks threatened. She is complaining of pain. Culture of this wound I did last week showed rare coag negative staph variant I have never heard of although I am doubtful this has clinical significance 05/09/20-Patient returns with a left lateral calf wound looking about the same, the biopsies reviewed show no atypical features and consistent with trauma or pressure injury or stasis change, patient has appointment to be scheduled with vascular We are using silver alginate and 3 layer compression Patient was started on Trental by dermatology which she has been taking for a week now 6/4; left lateral calf wound. I reviewed the dermatology notes with Dr. Martin Majestic from [facility] dermatology. See suggested the possibility of livedoid vasculopathy possibility of additional biopsies which I think  would have to be punch biopsies. Put her on pentoxifylline noteworthy that she is on Eliquis as well. We have been using silver alginate under compression. She has an appointment for repeat vascular studies on 7/6. This gets back to the fact that they did not take the wrap off on the original studies that were done. I do not believe she has an arterial issue. 6/10; left lateral calf wound. I put her on Hydrofera Blue last week. Some improvement in the surface condition of the wound. She has repeat vascular studies/reflux studies on 7/6. We are trying to get Apligraf through [place] [facility]. Her husband states he looked on the website this is not going to be approved. I am really not sure if this is a "class effect" Objective Constitutional Sitting or standing Blood Pressure is within target range for patient.. Pulse regular and within target range for patient.Marland Kitchen Respirations regular, non-labored and within target range.. Temperature is normal and within the target range for the patient.Marland Kitchen Appears in no distress. Vitals Time Taken: 8:15 AM, Height: 69 in, Weight: 163 lbs, BMI: 24.1, Temperature: 97.7 F, Pulse: 44 bpm, Respiratory Rate: 18 breaths/min, Blood Pressure: 124/65 mmHg. General Notes: Wound exam; left lateral calf wound. Somewhat better less adherent debris. Still using a #5 curette to remove very adherent necrotic surfaces. I think we are making progress albeit slowly. No evidence of infection Integumentary (Hair, Skin) Wound #5 status is Open. Original cause of wound was Gradually Appeared. The wound is located on the Left,Lateral Lower Leg. The wound measures 3.2cm length x 2.1cm width x 0.2cm depth; 5.278cm^2 area and 1.056cm^3 volume. There is Fat Layer (Subcutaneous Tissue) Exposed exposed. There is no tunneling or undermining noted. There is a medium amount of serosanguineous drainage noted. The wound margin is flat and intact. There is small (1-33%) pink granulation within the  wound bed. There is a large (67-100%) amount of necrotic tissue within the wound bed including Eschar and Adherent Slough. Assessment Active Problems ICD-10 Chronic venous hypertension (idiopathic) with inflammation of right lower extremity Non-pressure chronic ulcer of other part of left lower leg with other specified severity Chronic venous hypertension (idiopathic) with inflammation of left lower extremity Non-pressure chronic ulcer of other part of left foot with fat layer exposed Procedures Wound #5 Pre-procedure diagnosis of Wound #5 is a Venous Leg Ulcer located on the Left,Lateral Lower Leg .Severity of Tissue Pre Debridement is: Fat layer exposed. There was a Excisional Skin/Subcutaneous Tissue Debridement with a total area  of 6.72 sq cm performed by Ricard Dillon., MD. With the following instrument(s): Curette to remove Viable and Non-Viable tissue/material. Material removed includes Subcutaneous Tissue, Slough, Skin: Dermis, and Fibrin/Exudate after achieving pain control using Lidocaine 4% Topical Solution. A time out was conducted at 08:55, prior to the start of the procedure. A Moderate amount of bleeding was controlled with Pressure. The procedure was tolerated well with a pain level of 0 throughout and a pain level of 2 following the procedure. Post Debridement Measurements: 3.2cm length x 2.1cm width x 0.2cm depth; 1.056cm^3 volume. Character of Wound/Ulcer Post Debridement is improved. Severity of Tissue Post Debridement is: Fat layer exposed. Post procedure Diagnosis Wound #5: Same as Pre-Procedure Pre-procedure diagnosis of Wound #5 is a Venous Leg Ulcer located on the Left,Lateral Lower Leg . There was a Three Layer Compression Therapy Procedure by Carlene Coria, RN. Post procedure Diagnosis Wound #5: Same as Pre-Procedure Plan Follow-up Appointments: Return Appointment in 1 week. Dressing Change Frequency: Wound #5 Left,Lateral Lower Leg: Do not change entire  dressing for one week. Skin Barriers/Peri-Wound Care: Moisturizing lotion - patient to lotion right leg every night. TCA Cream or Ointment - mixed with lotion to left leg Wound Cleansing: May shower with protection. - cast protector Primary Wound Dressing: Wound #5 Left,Lateral Lower Leg: Hydrofera Blue Secondary Dressing: Wound #5 Left,Lateral Lower Leg: Dry Gauze ABD pad Edema Control: 3 Layer Compression System - Left Lower Extremity - use kerlix instead of cotton Avoid standing for long periods of time Elevate legs to the level of the heart or above for 30 minutes daily and/or when sitting, a frequency of: - throughout the day. Exercise regularly Support Garment 20-30 mm/Hg pressure to: - patient to apply farrow wrap 4000 to right leg. Apply in the morning and remove at night. Off-Loading: Open toe surgical shoe to: - left Additional Orders / Instructions: Other: - When you have your venous study on 06/17/2020 ensure to remove compression wrap before going into appointment to ensure you have the entire test completed. Call wound center for a nurse visit for rewrapping. 1. I am still using the [facility] with 3 layer compression. She has home health 2. Reflux studies on 7/6 3. I am not optimistic about the [employer] application [[employer]]. Electronic Signature(s) Signed: 05/26/2020 8:32:12 AM By: Linton Ham MD Entered By: Linton Ham on 05/22/2020 09:11:54 -------------------------------------------------------------------------------- SuperBill Details Patient Name: Date of Service: [name] 05/22/2020 Medical Record Number: [id number] Patient Account Number: [id number] Date of Birth/Sex: Treating RN: 1933-03-17 (86 y.o. 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

## 2020-05-27 ENCOUNTER — Ambulatory Visit (INDEPENDENT_AMBULATORY_CARE_PROVIDER_SITE_OTHER): Payer: Medicare Other | Admitting: *Deleted

## 2020-05-27 DIAGNOSIS — I4819 Other persistent atrial fibrillation: Secondary | ICD-10-CM

## 2020-05-28 LAB — CUP PACEART REMOTE DEVICE CHECK
Battery Remaining Longevity: 101 mo
Battery Voltage: 2.99 V
Brady Statistic AP VP Percent: 0 %
Brady Statistic AP VS Percent: 0 %
Brady Statistic AS VP Percent: 83.53 %
Brady Statistic AS VS Percent: 16.47 %
Brady Statistic RA Percent Paced: 0 %
Brady Statistic RV Percent Paced: 83.53 %
Date Time Interrogation Session: 20210615021224
Implantable Lead Implant Date: 20180905
Implantable Lead Implant Date: 20180905
Implantable Lead Location: 753859
Implantable Lead Location: 753860
Implantable Lead Model: 3830
Implantable Lead Model: 5076
Implantable Pulse Generator Implant Date: 20180905
Lead Channel Impedance Value: 266 Ohm
Lead Channel Impedance Value: 342 Ohm
Lead Channel Impedance Value: 361 Ohm
Lead Channel Impedance Value: 551 Ohm
Lead Channel Pacing Threshold Amplitude: 0.5 V
Lead Channel Pacing Threshold Pulse Width: 0.4 ms
Lead Channel Sensing Intrinsic Amplitude: 1.375 mV
Lead Channel Sensing Intrinsic Amplitude: 1.375 mV
Lead Channel Sensing Intrinsic Amplitude: 2.875 mV
Lead Channel Sensing Intrinsic Amplitude: 3.125 mV
Lead Channel Setting Pacing Amplitude: 2.5 V
Lead Channel Setting Pacing Pulse Width: 0.4 ms
Lead Channel Setting Sensing Sensitivity: 1.2 mV

## 2020-05-28 NOTE — Progress Notes (Signed)
[name], [name] ([id number]) Visit Report for 05/22/2020 Arrival Information Details Patient Name: Date of Service: [place], [name] 05/22/2020 8:15 A M Medical Record Number: [id number] Patient Account Number: [id number] Date of Birth/Sex: Treating RN: [birth year] (86 y.o. [name] Primary Care Ori Kreiter: Lajean Manes T Other Clinician: Referring Sundae Maners: Treating Adaeze Better/Extender: Evelena Peat in Treatment: 31 Visit Information History Since Last Visit Added or deleted any medications: No Patient Arrived: Ambulatory Any new allergies or adverse reactions: No Arrival Time: 08:28 Had a fall or experienced change in No Accompanied By: husband activities of daily living that may affect Transfer Assistance: None risk of falls: Patient Identification Verified: Yes Signs or symptoms of abuse/neglect since last visito No Secondary Verification Process Completed: Yes Hospitalized since last visit: No Patient Requires Transmission-Based Precautions: No Implantable device outside of the clinic excluding No Patient Has Alerts: Yes cellular tissue based products placed in the center Patient Alerts: Patient on Blood Thinner since last visit: Right ABI:0.99 Has Dressing in Place as Prescribed: Yes Has Compression in Place as Prescribed: Yes Pain Present Now: No Electronic Signature(s) Signed: 05/22/2020 4:59:25 PM By: Kela Millin Entered By: Kela Millin on 05/22/2020 08:28:34 -------------------------------------------------------------------------------- Compression Therapy Details Patient Name: Date of Service: [name]. 05/22/2020 8:15 A M Medical Record Number: [id number] Patient Account Number: [id number] Date of Birth/Sex: Treating RN: 1933/04/12 (86 y.o. [name] Primary Care Jawana Reagor: Patrina Levering Other Clinician: Referring Haedyn Breau: Treating Caylan Chenard/Extender: [name] in  Treatment: 22 Compression Therapy Performed for Wound Assessment: Wound #5 Left,Lateral Lower Leg Performed By: Clinician Carlene Coria, RN Compression Type: Three Layer Post Procedure Diagnosis Same as Pre-procedure Electronic Signature(s) Signed: 05/22/2020 5:25:48 PM By: Deon Pilling Entered By: Deon Pilling on 05/22/2020 09:05:46 -------------------------------------------------------------------------------- Encounter Discharge Information Details Patient Name: Date of Service: [name]. 05/22/2020 8:15 A M Medical Record Number: [id number] Patient Account Number: [id number] Date of Birth/Sex: Treating RN: 02/23/1933 (86 y.o. [name] Primary Care Brookelyn Gaynor: Lajean Manes T Other Clinician: Referring Dvante Hands: Treating Regino Fournet/Extender: Evelena Peat in Treatment: 22 Encounter Discharge Information Items Post Procedure Vitals Discharge Condition: Stable Temperature (F): 97.7 Ambulatory Status: Cane Pulse (bpm): 56 Discharge Destination: Home Respiratory Rate (breaths/min): 18 Transportation: Private Auto Blood Pressure (mmHg): 124/65 Accompanied By: self Schedule Follow-up Appointment: Yes Clinical Summary of Care: Patient Declined Electronic Signature(s) Signed: 05/28/2020 9:54:21 AM By: Carlene Coria RN Entered By: Carlene Coria on 05/22/2020 09:22:39 -------------------------------------------------------------------------------- Lower Extremity Assessment Details Patient Name: Date of Service: [name]. 05/22/2020 8:15 A M Medical Record Number: [id number] Patient Account Number: [id number] Date of Birth/Sex: Treating RN: [birth year] (86 y.o. [name] Primary Care Graciemae Delisle: Lajean Manes T Other Clinician: Referring [name]: Treating Daeron Carreno/Extender: Venita Lick T Weeks in Treatment: 22 Edema Assessment Assessed: [Left: No] [Right: No] Edema: [Left: No] [Right: No] Calf Left:  Right: Point of Measurement: 42 cm From Medial Instep 30 cm cm Ankle Left: Right: Point of Measurement: 13 cm From Medial Instep 21 cm cm Vascular Assessment Pulses: Dorsalis Pedis Palpable: [Left:Yes] Electronic Signature(s) Signed: 05/22/2020 4:59:25 PM By: Kela Millin Entered By: Kela Millin on 05/22/2020 08:29:27 -------------------------------------------------------------------------------- Multi Wound Chart Details Patient Name: Date of Service: [name]. 05/22/2020 8:15 A M Medical Record Number: [id number] Patient Account Number: [id number] Date of Birth/Sex: Treating RN: 1932-12-15 (86 y.o. [name] Primary Care Zakye Baby: Patrina Levering Other Clinician: Referring Falan Hensler: Treating Caitriona Sundquist/Extender: Dellia Nims  Secundino Ginger, Hal T Weeks in Treatment: 22 Vital Signs Height(in): 69 Pulse(bpm): 44 Weight(lbs): 163 Blood Pressure(mmHg): 124/65 Body Mass Index(BMI): 24 Temperature(F): 97.7 Respiratory Rate(breaths/min): 18 Photos: [5:No Photos Left, Lateral Lower Leg] [N/A:N/A N/A] Wound Location: [5:Gradually Appeared] [N/A:N/A] Wounding Event: [5:Venous Leg Ulcer] [N/A:N/A] Primary Etiology: [5:Cataracts, Anemia, Arrhythmia,] [N/A:N/A] Comorbid History: [5:Congestive Heart Failure, Hypertension, Peripheral Venous Disease, Received Radiation 03/21/2020] [N/A:N/A] Date Acquired: [5:8] [N/A:N/A] Weeks of Treatment: [5:Open] [N/A:N/A] Wound Status: [5:3.2x2.1x0.2] [N/A:N/A] Measurements L x W x D (cm) [5:5.278] [N/A:N/A] A (cm) : rea [5:1.056] [N/A:N/A] Volume (cm) : [5:-949.30%] [N/A:N/A] % Reduction in A [5:rea: -2012.00%] [N/A:N/A] % Reduction in Volume: [5:Full Thickness Without Exposed] [N/A:N/A] Classification: [5:Support Structures Medium] [N/A:N/A] Exudate A mount: [5:Serosanguineous] [N/A:N/A] Exudate Type: [5:red, brown] [N/A:N/A] Exudate Color: [5:Flat and Intact] [N/A:N/A] Wound Margin: [5:Small (1-33%)]  [N/A:N/A] Granulation A mount: [5:Pink] [N/A:N/A] Granulation Quality: [5:Large (67-100%)] [N/A:N/A] Necrotic A mount: [5:Eschar, Adherent Slough] [N/A:N/A] Necrotic Tissue: [5:Fat Layer (Subcutaneous Tissue)] [N/A:N/A] Exposed Structures: [5:Exposed: Yes Fascia: No Tendon: No Muscle: No Joint: No Bone: No Small (1-33%)] [N/A:N/A] Epithelialization: [5:Debridement - Excisional] [N/A:N/A] Debridement: Pre-procedure Verification/Time Out 08:55 [N/A:N/A] Taken: [5:Lidocaine 4% Topical Solution] [N/A:N/A] Pain Control: [5:Subcutaneous, Slough] [N/A:N/A] Tissue Debrided: [5:Skin/Subcutaneous Tissue] [N/A:N/A] Level: [5:6.72] [N/A:N/A] Debridement A (sq cm): [5:rea Curette] [N/A:N/A] Instrument: [5:Moderate] [N/A:N/A] Bleeding: [5:Pressure] [N/A:N/A] Hemostasis A chieved: [5:0] [N/A:N/A] Procedural Pain: [5:2] [N/A:N/A] Post Procedural Pain: [5:Procedure was tolerated well] [N/A:N/A] Debridement Treatment Response: [5:3.2x2.1x0.2] [N/A:N/A] Post Debridement Measurements L x W x D (cm) [5:1.056] [N/A:N/A] Post Debridement Volume: (cm) [5:Compression Therapy] [N/A:N/A] Procedures Performed: [5:Debridement] Treatment Notes Electronic Signature(s) Signed: 05/22/2020 5:25:48 PM By: Deon Pilling Signed: 05/26/2020 8:32:12 AM By: Linton Ham MD Entered By: Linton Ham on 05/22/2020 09:08:08 -------------------------------------------------------------------------------- Multi-Disciplinary Care Plan Details Patient Name: Date of Service: [place], [place]. 05/22/2020 8:15 A M Medical Record Number: [id number] Patient Account Number: [id number] Date of Birth/Sex: Treating RN: 06-16-1933 (86 y.o. [name] Primary Care Maggie Senseney: Patrina Levering Other Clinician: Referring Mearle Drew: Treating Shineka Auble/Extender: Evelena Peat in Treatment: 22 Active Inactive Abuse / Safety / Falls / Self Care Management Nursing Diagnoses: Potential for  falls Goals: Patient/caregiver will verbalize understanding of skin care regimen Date Initiated: 12/20/2019 Date Inactivated: 02/14/2020 Target Resolution Date: 02/22/2020 Goal Status: Met Patient/caregiver will verbalize/demonstrate understanding of what to do in case of emergency Date Initiated: 12/20/2019 Target Resolution Date: 06/13/2020 Goal Status: Active Interventions: Assess fall risk on admission and as needed Provide education on fall prevention Notes: Pain, Acute or Chronic Nursing Diagnoses: Pain, acute or chronic: actual or potential Potential alteration in comfort, pain Goals: Patient will verbalize adequate pain control and receive pain control interventions during procedures as needed Date Initiated: 12/20/2019 Date Inactivated: 02/14/2020 Target Resolution Date: 02/22/2020 Goal Status: Met Patient/caregiver will verbalize comfort level met Date Initiated: 12/20/2019 Target Resolution Date: 06/13/2020 Goal Status: Active Interventions: Complete pain assessment as per visit requirements Provide education on pain management Notes: Wound/Skin Impairment Nursing Diagnoses: Knowledge deficit related to ulceration/compromised skin integrity Goals: Patient/caregiver will verbalize understanding of skin care regimen Date Initiated: 12/20/2019 Target Resolution Date: 06/13/2020 Goal Status: Active Interventions: Assess patient/caregiver ability to perform ulcer/skin care regimen upon admission and as needed Provide education on ulcer and skin care Treatment Activities: Skin care regimen initiated : 12/20/2019 Topical wound management initiated : 12/20/2019 Notes: Electronic Signature(s) Signed: 05/22/2020 5:25:48 PM By: Deon Pilling Entered By: Deon Pilling on 05/22/2020 09:00:33 -------------------------------------------------------------------------------- Pain Assessment Details Patient Name: Date of Service: [name], [place]  TSY J. 05/22/2020 8:15 A M Medical Record Number:  [id number] Patient Account Number: [id number] Date of Birth/Sex: Treating RN: 06/28/1933 (86 y.o. [name] Primary Care Muhannad Bignell: Patrina Levering Other Clinician: Referring Krystie Leiter: Treating Deontray Hunnicutt/Extender: [name] in Treatment: 22 Active Problems Location of Pain Severity and Description of Pain Patient Has Paino No Site Locations Pain Management and Medication Current Pain Management: Electronic Signature(s) Signed: 05/22/2020 4:59:25 PM By: Kela Millin Entered By: Kela Millin on 05/22/2020 08:29:12 -------------------------------------------------------------------------------- Patient/Caregiver Education Details Patient Name: Date of Service: [name] 6/10/2021andnbsp8:15 A M Medical Record Number: [id number] Patient Account Number: [id number] Date of Birth/Gender: Treating RN: 09-Jun-1933 (86 y.o. [name] Primary Care Physician: Lajean Manes T Other Clinician: Referring Physician: Treating Physician/Extender: Evelena Peat in Treatment: 22 Education Assessment Education Provided To: Patient Education Topics Provided Safety: Handouts: Personal Safety Methods: Explain/Verbal Responses: Reinforcements needed Electronic Signature(s) Signed: 05/22/2020 5:25:48 PM By: Deon Pilling Entered By: Deon Pilling on 05/22/2020 09:00:49 -------------------------------------------------------------------------------- Wound Assessment Details Patient Name: Date of Service: [place], [place] 05/22/2020 8:15 A M Medical Record Number: [id number] Patient Account Number: [id number] Date of Birth/Sex: Treating RN: 07/06/1933 (86 y.o. [name] Primary Care Alberta Lenhard: Patrina Levering Other Clinician: Referring Meridian Scherger: Treating Aylssa Herrig/Extender: [name] in Treatment: 22 Wound Status Wound Number: 5 Primary Venous Leg  Ulcer Etiology: Wound Location: Left, Lateral Lower Leg Wound Open Wounding Event: Gradually Appeared Status: Date Acquired: 03/21/2020 Comorbid Cataracts, Anemia, Arrhythmia, Congestive Heart Failure, Weeks Of Treatment: 8 History: Hypertension, Peripheral Venous Disease, Received Radiation Clustered Wound: No Photos Photo Uploaded By: Mikeal Hawthorne on 05/27/2020 11:23:14 Wound Measurements Length: (cm) 3.2 Width: (cm) 2.1 Depth: (cm) 0.2 Area: (cm) 5.278 Volume: (cm) 1.056 % Reduction in Area: -949.3% % Reduction in Volume: -2012% Epithelialization: Small (1-33%) Tunneling: No Undermining: No Wound Description Classification: Full Thickness Without Exposed Support Structures Wound Margin: Flat and Intact Exudate Amount: Medium Exudate Type: Serosanguineous Exudate Color: red, brown Wound Bed Granulation Amount: Small (1-33%) Granulation Quality: Pink Necrotic Amount: Large (67-100%) Necrotic Quality: Eschar, Adherent Slough Foul Odor After Cleansing: No Slough/Fibrino Yes Exposed Structure Fascia Exposed: No Fat Layer (Subcutaneous Tissue) Exposed: Yes Tendon Exposed: No Muscle Exposed: No Joint Exposed: No Bone Exposed: No Treatment Notes Wound #5 (Left, Lateral Lower Leg) 1. Cleanse With Wound Cleanser Soap and water 3. Primary Dressing Applied Hydrofera Blue 4. Secondary Dressing Dry Gauze 6. Support Layer Applied 3 layer compression wrap Notes netting. Electronic Signature(s) Signed: 05/22/2020 4:59:25 PM By: Kela Millin Entered By: Kela Millin on 05/22/2020 08:30:03 -------------------------------------------------------------------------------- Vitals Details Patient Name: Date of Service: [name], [place]. 05/22/2020 8:15 A M Medical Record Number: [id number] Patient Account Number: [id number] Date of Birth/Sex: Treating RN: 1933-05-12 (86 y.o. [name] Primary Care Veeda Virgo: Lajean Manes T Other Clinician: Referring  Chantrice Hagg: Treating Slate Debroux/Extender: [name] in Treatment: 22 Vital Signs Time Taken: 08:15 Temperature (F): 97.7 Height (in): 69 Pulse (bpm): 44 Weight (lbs): 163 Respiratory Rate (breaths/min): 18 Body Mass Index (BMI): 24.1 Blood Pressure (mmHg): 124/65 Reference Range: 80 - 120 mg / dl Electronic Signature(s) Signed: 05/22/2020 4:59:25 PM By: Kela Millin Entered By: Kela Millin on 05/22/2020 08:29:04

## 2020-05-28 NOTE — Progress Notes (Signed)
Remote pacemaker transmission.   

## 2020-05-30 ENCOUNTER — Other Ambulatory Visit: Payer: Self-pay

## 2020-05-30 ENCOUNTER — Encounter (HOSPITAL_BASED_OUTPATIENT_CLINIC_OR_DEPARTMENT_OTHER): Payer: Medicare Other | Admitting: Internal Medicine

## 2020-05-30 DIAGNOSIS — I87333 Chronic venous hypertension (idiopathic) with ulcer and inflammation of bilateral lower extremity: Secondary | ICD-10-CM | POA: Diagnosis not present

## 2020-05-30 NOTE — Progress Notes (Addendum)
DURA, MCCORMACK ([id number]) Visit Report for 05/30/2020 Arrival Information Details Patient Name: Date of Service: [place], [name] 05/30/2020 8:00 A M Medical Record Number: [id number] Patient Account Number: [id number] Date of Birth/Sex: Treating RN: Sep 04, 1933 (86 y.o. [name] Primary Care Truth Wolaver: Lajean Manes T Other Clinician: Referring Ondre Salvetti: Treating Kayin Kettering/Extender: Evelena Peat in Treatment: 23 Visit Information History Since Last Visit Added or deleted any medications: No Patient Arrived: Ambulatory Any new allergies or adverse reactions: No Arrival Time: 08:02 Had a fall or experienced change in No Accompanied By: spouse activities of daily living that may affect Transfer Assistance: None risk of falls: Patient Identification Verified: Yes Signs or symptoms of abuse/neglect since last visito No Secondary Verification Process Completed: Yes Hospitalized since last visit: No Patient Requires Transmission-Based Precautions: No Implantable device outside of the clinic excluding No Patient Has Alerts: Yes cellular tissue based products placed in the center Patient Alerts: Patient on Blood Thinner since last visit: Right ABI:0.99 Has Dressing in Place as Prescribed: Yes Has Compression in Place as Prescribed: Yes Pain Present Now: Yes Electronic Signature(s) Signed: 05/30/2020 4:42:48 PM By: [name] RN, BSN Entered By: [name] on 05/30/2020 08:05:23 -------------------------------------------------------------------------------- Compression Therapy Details Patient Name: Date of Service: Tracy Banker J. 05/30/2020 8:00 A M Medical Record Number: [id number] Patient Account Number: [id number] Date of Birth/Sex: Treating RN: 16-Mar-1933 (86 y.o. [name] Primary Care Kailen Hinkle: Patrina Levering Other Clinician: Referring Timaya Bojarski: Treating Hiya Point/Extender: [name] in  Treatment: 23 Compression Therapy Performed for Wound Assessment: Wound #5 Left,Lateral Lower Leg Performed By: Clinician Deon Pilling, RN Compression Type: Three Layer Post Procedure Diagnosis Same as Pre-procedure Electronic Signature(s) Signed: 05/30/2020 5:07:15 PM By: Kela Millin Entered By: Kela Millin on 05/30/2020 08:31:40 -------------------------------------------------------------------------------- Compression Therapy Details Patient Name: Date of Service: Tracy Sermon. 05/30/2020 8:00 A M Medical Record Number: [id number] Patient Account Number: [id number] Date of Birth/Sex: Treating RN: 1933/05/25 (86 y.o. [name] Primary Care Ja Pistole: Patrina Levering Other Clinician: Referring Tyge Somers: Treating Laquita Harlan/Extender: [name] in Treatment: 23 Compression Therapy Performed for Wound Assessment: Wound #7 Left,Distal,Lateral Lower Leg Performed By: Clinician Deon Pilling, RN Compression Type: Three Layer Post Procedure Diagnosis Same as Pre-procedure Electronic Signature(s) Signed: 05/30/2020 5:07:15 PM By: Kela Millin Entered By: Kela Millin on 05/30/2020 08:31:40 -------------------------------------------------------------------------------- Lower Extremity Assessment Details Patient Name: Date of Service: [name], [place]. 05/30/2020 8:00 A M Medical Record Number: [id number] Patient Account Number: [id number] Date of Birth/Sex: Treating RN: 1933/06/11 (86 y.o. [name] Primary Care Blayne Frankie: Lajean Manes T Other Clinician: Referring Simra Fiebig: Treating Karanveer Ramakrishnan/Extender: [name] in Treatment: 23 Edema Assessment Assessed: [Left: No] [Right: No] Edema: [Left: N] [Right: o] Calf Left: Right: Point of Measurement: 42 cm From Medial Instep 32.7 cm cm Ankle Left: Right: Point of Measurement: 13 cm From Medial Instep 20 cm cm Vascular  Assessment Pulses: Dorsalis Pedis Palpable: [Left:Yes] Electronic Signature(s) Signed: 05/30/2020 4:42:48 PM By: [name] RN, BSN Entered By: [name] on 05/30/2020 08:14:29 -------------------------------------------------------------------------------- Multi Wound Chart Details Patient Name: Date of Service: Tracy Banker J. 05/30/2020 8:00 A M Medical Record Number: [id number] Patient Account Number: [id number] Date of Birth/Sex: Treating RN: 06-10-1933 (86 y.o. [name] Primary Care Chene Kasinger: Patrina Levering Other Clinician: Referring Dianne Whelchel: Treating Sayler Mickiewicz/Extender: [name] in Treatment: 23 Vital Signs Height(in): 69 Pulse(bpm): 48 Weight(lbs): 163 Blood  Pressure(mmHg): 154/71 Body Mass Index(BMI): 24 Temperature(F): 97.8 Respiratory Rate(breaths/min): 18 Photos: [5:No Photos Left, Lateral Lower Leg] [7:No Photos Left, Distal, Lateral Lower Leg] [N/A:N/A N/A] Wound Location: [5:Gradually Appeared] [7:Gradually Appeared] [N/A:N/A] Wounding Event: [5:Venous Leg Ulcer] [7:Venous Leg Ulcer] [N/A:N/A] Primary Etiology: [5:Cataracts, Anemia, Arrhythmia,] [7:Cataracts, Anemia, Arrhythmia,] [N/A:N/A] Comorbid History: [5:Congestive Heart Failure, Hypertension, Peripheral Venous Disease, Received Radiation 03/21/2020] [7:Congestive Heart Failure, Hypertension, Peripheral Venous Disease, Received Radiation 05/30/2020] [N/A:N/A] Date Acquired: [5:10] [7:0] [N/A:N/A] Weeks of Treatment: [5:Open] [7:Open] [N/A:N/A] Wound Status: [5:3x2x0.2] [7:0.4x0.5x0.1] [N/A:N/A] Measurements L x W x D (cm) [5:4.712] [7:0.157] [N/A:N/A] A (cm) : rea [5:0.942] [7:0.016] [N/A:N/A] Volume (cm) : [5:-836.80%] [7:N/A] [N/A:N/A] % Reduction in Area: [5:-1784.00%] [7:N/A] [N/A:N/A] % Reduction in Volume: [5:Full Thickness Without Exposed] [7:Full Thickness Without Exposed] [N/A:N/A] Classification: [5:Support Structures Medium] [7:Support  Structures Small] [N/A:N/A] Exudate Amount: [5:Serosanguineous] [7:Serosanguineous] [N/A:N/A] Exudate Type: [5:red, brown] [7:red, brown] [N/A:N/A] Exudate Color: [5:Flat and Intact] [7:Flat and Intact] [N/A:N/A] Wound Margin: [5:Medium (34-66%)] [7:None Present (0%)] [N/A:N/A] Granulation Amount: [5:Red] [7:N/A] [N/A:N/A] Granulation Quality: [5:Medium (34-66%)] [7:Large (67-100%)] [N/A:N/A] Necrotic Amount: [5:Fat Layer (Subcutaneous Tissue)] [7:Fat Layer (Subcutaneous Tissue)] [N/A:N/A] Exposed Structures: [5:Exposed: Yes Fascia: No Tendon: No Muscle: No Joint: No Bone: No Small (1-33%)] [7:Exposed: Yes Fascia: No Tendon: No Muscle: No Joint: No Bone: No None] [N/A:N/A] Epithelialization: [5:Compression Therapy] [7:Compression Therapy] [N/A:N/A] Treatment Notes Electronic Signature(s) Signed: 05/30/2020 5:07:15 PM By: Kela Millin Signed: 05/30/2020 5:24:49 PM By: Linton Ham MD Entered By: Linton Ham on 05/30/2020 08:36:53 -------------------------------------------------------------------------------- Multi-Disciplinary Care Plan Details Patient Name: Date of Service: [name], [place]. 05/30/2020 8:00 A M Medical Record Number: [id number] Patient Account Number: [id number] Date of Birth/Sex: Treating RN: 1933-05-23 (86 y.o. [name] Primary Care Halayna Blane: Patrina Levering Other Clinician: Referring Arlet Marter: Treating Quin Mcpherson/Extender: Evelena Peat in Treatment: 23 Active Inactive Abuse / Safety / Falls / Self Care Management Nursing Diagnoses: Potential for falls Goals: Patient/caregiver will verbalize understanding of skin care regimen Date Initiated: 12/20/2019 Date Inactivated: 02/14/2020 Target Resolution Date: 02/22/2020 Goal Status: Met Patient/caregiver will verbalize/demonstrate understanding of what to do in case of emergency Date Initiated: 12/20/2019 Target Resolution Date: 06/13/2020 Goal Status:  Active Interventions: Assess fall risk on admission and as needed Provide education on fall prevention Notes: Pain, Acute or Chronic Nursing Diagnoses: Pain, acute or chronic: actual or potential Potential alteration in comfort, pain Goals: Patient will verbalize adequate pain control and receive pain control interventions during procedures as needed Date Initiated: 12/20/2019 Date Inactivated: 02/14/2020 Target Resolution Date: 02/22/2020 Goal Status: Met Patient/caregiver will verbalize comfort level met Date Initiated: 12/20/2019 Target Resolution Date: 06/13/2020 Goal Status: Active Interventions: Complete pain assessment as per visit requirements Provide education on pain management Notes: Wound/Skin Impairment Nursing Diagnoses: Knowledge deficit related to ulceration/compromised skin integrity Goals: Patient/caregiver will verbalize understanding of skin care regimen Date Initiated: 12/20/2019 Target Resolution Date: 06/13/2020 Goal Status: Active Interventions: Assess patient/caregiver ability to perform ulcer/skin care regimen upon admission and as needed Provide education on ulcer and skin care Treatment Activities: Skin care regimen initiated : 12/20/2019 Topical wound management initiated : 12/20/2019 Notes: Electronic Signature(s) Signed: 05/30/2020 5:07:15 PM By: Kela Millin Entered By: Kela Millin on 05/30/2020 08:00:59 -------------------------------------------------------------------------------- Pain Assessment Details Patient Name: Date of Service: Tracy Banker J. 05/30/2020 8:00 A M Medical Record Number: [id number] Patient Account Number: [id number] Date of Birth/Sex: Treating RN: [birth year] (86 y.o. [name] Primary Care Chaniqua Brisby: Lajean Manes T Other Clinician: Referring Tamrah Victorino: Treating Kindal Ponti/Extender: Linton Ham  [place], Hal T Weeks in Treatment: 23 Active Problems Location of Pain Severity and Description of  Pain Patient Has Paino Yes Site Locations Pain Location: Pain in Ulcers With Dressing Change: Yes Duration of the Pain. Constant / Intermittento Intermittent Rate the pain. Current Pain Level: 2 Worst Pain Level: 8 Least Pain Level: 0 Character of Pain Describe the Pain: Aching Pain Management and Medication Current Pain Management: Medication: Yes Is the Current Pain Management Adequate: Adequate How does your wound impact your activities of daily livingo Sleep: No Bathing: No Appetite: No Relationship With Others: No Bladder Continence: No Emotions: No Bowel Continence: No Work: No Toileting: No Drive: No Dressing: No Hobbies: No Electronic Signature(s) Signed: 05/30/2020 4:42:48 PM By: [name] RN, BSN Entered By: [name] on 05/30/2020 08:07:17 -------------------------------------------------------------------------------- Patient/Caregiver Education Details Patient Name: Date of Service: Coller[place], [name] 6/18/2021andnbsp8:00 A M Medical Record Number: [id number] Patient Account Number: [id number] Date of Birth/Gender: Treating RN: 1932/12/22 (86 y.o. [name] Primary Care Physician: Lajean Manes T Other Clinician: Referring Physician: Treating Physician/Extender: Evelena Peat in Treatment: 23 Education Assessment Education Provided To: Patient Education Topics Provided Pain: Handouts: A Guide to Pain Control Methods: Explain/Verbal Responses: State content correctly Safety: Handouts: Medication Safety Methods: Explain/Verbal Responses: State content correctly Wound/Skin Impairment: Handouts: Caring for Your Ulcer Methods: Explain/Verbal Responses: State content correctly Electronic Signature(s) Signed: 05/30/2020 5:07:15 PM By: Kela Millin Entered By: Kela Millin on 05/30/2020 08:05:00 -------------------------------------------------------------------------------- Wound  Assessment Details Patient Name: Date of Service: Tracy Banker J. 05/30/2020 8:00 A M Medical Record Number: [id number] Patient Account Number: [id number] Date of Birth/Sex: Treating RN: Jan 26, 1933 (86 y.o. [name] Primary Care Revan Gendron: Lajean Manes T Other Clinician: Referring Lupita Rosales: Treating Elyssa Pendelton/Extender: Evelena Peat in Treatment: 23 Wound Status Wound Number: 5 Primary Venous Leg Ulcer Etiology: Wound Location: Left, Lateral Lower Leg Wound Open Wounding Event: Gradually Appeared Status: Date Acquired: 03/21/2020 Comorbid Cataracts, Anemia, Arrhythmia, Congestive Heart Failure, Weeks Of Treatment: 10 History: Hypertension, Peripheral Venous Disease, Received Radiation Clustered Wound: No Photos Wound Measurements Length: (cm) 3 Width: (cm) 2 Depth: (cm) 0.2 Area: (cm) 4.712 Volume: (cm) 0.942 Wound Description Classification: Full Thickness Without Exposed Support Structu Wound Margin: Flat and Intact Exudate Amount: Medium Exudate Type: Serosanguineous Exudate Color: red, brown Foul Odor After Cleansing: Slough/Fibrino % Reduction in Area: -836.8% % Reduction in Volume: -1784% Epithelialization: Small (1-33%) Tunneling: No Undermining: No res No Yes Wound Bed Granulation Amount: Medium (34-66%) Exposed Structure Granulation Quality: Red Fascia Exposed: No Necrotic Amount: Medium (34-66%) Fat Layer (Subcutaneous Tissue) Exposed: Yes Necrotic Quality: Adherent Slough Tendon Exposed: No Muscle Exposed: No Joint Exposed: No Bone Exposed: No Electronic Signature(s) Signed: 06/04/2020 4:25:30 PM By: [name] RN, BSN Signed: 06/04/2020 5:20:20 PM By: Minerva Fester Previous Signature: 05/30/2020 4:42:48 PM Version By: [name] RN, BSN Entered By: Minerva Fester on 06/04/2020 15:48:13 -------------------------------------------------------------------------------- Wound Assessment Details Patient  Name: Date of Service: Tracy Banker J. 05/30/2020 8:00 A M Medical Record Number: [id number] Patient Account Number: [id number] Date of Birth/Sex: Treating RN: 11/06/1933 (86 y.o. [name] Primary Care Tanica Gaige: Lajean Manes T Other Clinician: Referring Anais Koenen: Treating Mekenzie Modeste/Extender: [name] in Treatment: 23 Wound Status Wound Number: 7 Primary Venous Leg Ulcer Etiology: Wound Location: Left, Distal, Lateral Lower Leg Wound Open Wounding Event: Gradually Appeared Status: Date Acquired: 05/30/2020 Comorbid Cataracts, Anemia, Arrhythmia, Congestive Heart Failure, Weeks Of Treatment: 0 History: Hypertension, Peripheral Venous  Disease, Received Radiation Clustered Wound: No Photos Wound Measurements Length: (cm) 0.4 Width: (cm) 0.5 Depth: (cm) 0.1 Area: (cm) 0.157 Volume: (cm) 0.016 % Reduction in Area: 0% % Reduction in Volume: 0% Epithelialization: None Tunneling: No Undermining: No Wound Description Classification: Full Thickness Without Exposed Support Structures Wound Margin: Flat and Intact Exudate Amount: Small Exudate Type: Serosanguineous Exudate Color: red, brown Foul Odor After Cleansing: No Slough/Fibrino Yes Wound Bed Granulation Amount: None Present (0%) Exposed Structure Necrotic Amount: Large (67-100%) Fascia Exposed: No Necrotic Quality: Adherent Slough Fat Layer (Subcutaneous Tissue) Exposed: Yes Tendon Exposed: No Muscle Exposed: No Joint Exposed: No Bone Exposed: No Electronic Signature(s) Signed: 06/04/2020 4:25:30 PM By: [name] RN, BSN Signed: 06/04/2020 5:20:20 PM By: Minerva Fester Previous Signature: 05/30/2020 4:42:48 PM Version By: [name] RN, BSN Entered By: Minerva Fester on 06/04/2020 15:48:37 -------------------------------------------------------------------------------- [place] Details Patient Name: Date of Service: [name], Tracy J. 05/30/2020 8:00 A M Medical  Record Number: [id number] Patient Account Number: [id number] Date of Birth/Sex: Treating RN: 1933/06/22 (86 y.o. [name] Primary Care Cynithia Hakimi: Lajean Manes T Other Clinician: Referring Tidus Upchurch: Treating Shyane Fossum/Extender: Evelena Peat in Treatment: 23 Vital Signs Time Taken: 08:05 Temperature (F): 97.8 Height (in): 69 Pulse (bpm): 48 Source: Stated Respiratory Rate (breaths/min): 18 Weight (lbs): 163 Blood Pressure (mmHg): 154/71 Source: Stated Reference Range: 80 - 120 mg / dl Body Mass Index (BMI): 24.1 Electronic Signature(s) Signed: 05/30/2020 4:42:48 PM By: [name] RN, BSN Entered By: [name] on 05/30/2020 08:05:54

## 2020-05-30 NOTE — Progress Notes (Signed)
LATRECE, NITTA ([id number]) Visit Report for 05/30/2020 HPI Details Patient Name: Date of Service: [place], [name] 05/30/2020 8:00 A M Medical Record Number: [id number] Patient Account Number: [id number] Date of Birth/Sex: Treating RN: 13-Sep-1933 (86 y.o. [name] Primary Care Provider: Patrina Levering Other Clinician: Referring Provider: Treating Provider/Extender: Evelena Peat in Treatment: 23 History of Present Illness HPI Description: ADMISSION 12/20/2019 This is an 86 year old woman referred by her primary physician Dr. Felipa Eth for review of a wound on her right lateral lower leg. She was actually in this clinic on 2 separate occasions in 2010 and 2012 cared for by Dr. Sherilyn Cooter. At that point in time she had wounds on her right leg as well. She tells [place] to 1 month ago she noticed a scab building up on her right lateral lower leg this opened into a wound. There was no overt cause of this no trauma, no infection that she is aware of. She has a history of chronic venous insufficiency and wears compression stockings fairly religiously indeed she is done well over the last 8 years since she was last in this clinic. She has been applying Vaseline on this and a covering phone. This is not progressing towards healing. Past medical history; interstitial lung disease, chronic atrial fibrillation status post pacemaker, osteoarthritis of the left knee, left breast CA, chronic repeat venous insufficiency. She takes Eliquis for her atrial fibrillation stroke prophylaxis. ABI in our clinic was 0.99 on the right 1/15; superficial wound on the right lateral calf in the setting of severe acute skin changes from chronic venous insufficiency and lymphedema. We used Iodoflex last week she complained of a lot of pain 1/22; this is a small but difficult wound on the right lateral calf in the setting of severe chronic venous insufficiency and secondary lymphedema.  She continues to state the wounds things hurts when she is up on it but seems to be relieved by putting her leg up. I changed her to [facility] last week because the Iodoflex seem to be causing stinging. 1/29. This wound appears to be contracting somewhat. Changes of chronic venous insufficiency with secondary lymphedema 2/12; not as good as surface today and slightly bigger. I had to change her from Iodoflex to [facility] because of the stinging pain although she does not think it was any better on the [facility]. She comes into clinic today with a area on the medial left great toe. She said she noticed blood on her sock last Saturday. She had some form of bunion surgery by Dr. Ila Mcgill her podiatrist sometime in 2019 she said he "shave the area". I could not find his operative report although I did see reference to the bunion in the area. 2/19; somewhat improved wound on the right lateral lower leg however the wound she came in on the bunion of her left great toe is actually I think larger still somewhat inflamed. We have been using Iodoflex 2/26; not much improvement in either wound area which is the original venous wound on the right lateral lower leg and in the area on the bunion of her left great toe. This still looks somewhat inflamed and tender. We have been using Iodoflex with open much improvement. 3/4; in general both her wounds look better this includes the original venous insufficiency wound on the right lateral lower leg and the area on her tip of her bunion on the left great toe medial aspect of the MTP. Change  to [facility] last week 3/12; the patient still has a small geographic shaped wound on the right lateral lower leg. We have been using Hydrofera Blue but I changed her to endoform today. She also has the area in the tip of the bunion of the left great toe. We will try Hydrofera Blue here as well 3/19; the small geographic wound on the right lateral lower leg  has not filled in. There is some surrounding induration we have noticed this previously. This may be venous inflammation but I wonder about biopsying this if this is not closing up. The area over the left medial first MTP [bunion deformity] requires debridement. This is not closing in. I am not convinced she is offloading adequately 3/26; right lateral lower leg in the setting of severe venous insufficiency and also an area over the first MTP bunion deformity. No debridement is required. We have been using endoform 4/9; right lateral lower leg wound in the setting of severe venous insufficiency and also a refractory area over the first MTP bunion deformity. The area on the right lateral lower leg is just about closed there is still a minor open area here. She comes in today with a mirror image area on the left lateral lower leg. She says this started as a scab 2 weeks ago and is gradually morphed into an open wound very similar to what she has on the right leg. She has severe bilateral venous insufficiency with venous hypertension obvious from clinical exam. 4/16; her original wound the right lateral leg wound in the setting of chronic venous hypertension is a small open area but very small. We have been using endoform. Her wound over the left first MTP bunion deformity also appears to be small and closing in. Unfortunately she has a large relative area on the left lateral calf which was new last week. Very adherent debris over this we have been using Iodoflex however that may be contributing to the debris on the wound surface 4/23; the original wound is closed and the area on the left first metatarsal bunion deformity is also almost closed the new area from last week required debridement. She went for her reflux studies that did not take her lower extremity wraps off. This is not helpful. She did have significant reflux in the right common femoral vein. I am not going to press this issue further.  She clinically has severe venous hypertension 4/29; the original wound is closed on the right lateral lower calf. The area on the left first metatarsal/bunion deformity has a small slitlike opening that is still not closed. The real problem here is now wound on the left lateral calf which is necrotic and deep. We used Iodoflex on this wound last time. Silver alginate on the left first toe The patient has Farrow wraps. We should be able to transition the right leg into compression stockings and were doing this today. 5/7; the original wound remains closed on the right lateral calf. The left first metatarsal head still is open. Deterioration in the wound which is the new wound from several weeks ago on the left lateral calf. The patient is not aware how this could have happened. We have been using Sorbact starting last week under 3 layer compression 5/14; the right lateral calf is closed the area on the first met head on the left is closed However the new area from 3 weeks ago on the left lateral calf continues to expand. There is raised nodular tissue around  the wound necrotic debris on the surface. Circumference of the wound also looks necrotic. It looks as though there is involvement of the tissue under the skin around the large area of this wound which looks threatened. She is complaining of pain. Culture of this wound I did last week showed rare coag negative staph variant I have never heard of although I am doubtful this has clinical significance 05/09/20-Patient returns with a left lateral calf wound looking about the same, the biopsies reviewed show no atypical features and consistent with trauma or pressure injury or stasis change, patient has appointment to be scheduled with vascular We are using silver alginate and 3 layer compression Patient was started on Trental by dermatology which she has been taking for a week now 6/4; left lateral calf wound. I reviewed the dermatology notes with Dr.  Martin Majestic from [facility] dermatology. 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 Put her on pentoxifylline noteworthy that she is on Eliquis as well. We have been using silver alginate under compression. She has an appointment for repeat vascular studies on 7/6. This gets back to the fact that they did not take the wrap off on the original studies that were done. I do not believe she has an arterial issue. 6/10; left lateral calf wound. I put her on Hydrofera Blue last week. Some improvement in the surface condition of the wound. She has repeat vascular studies/reflux studies on 7/6. We are trying to get Apligraf through [place] [employer]. Her husband states he looked on the website this is not going to be approved. I am really not sure if this is a "class effect" 6/18; somewhat surprisingly Apligraf was approved and 100% covered. Her husband was also quite shocked by this. Nevertheless we have her repeat vascular studies on 7/6 and I will not be able to apply this until this is done. We are using Hydrofera Blue to the wound on the left. Her original wound on the right lower leg has maintain closure using compression stocking Electronic Signature(s) Signed: 05/30/2020 5:24:49 PM By: Linton Ham MD Entered By: Linton Ham on 05/30/2020 08:39:12 -------------------------------------------------------------------------------- Physical Exam Details Patient Name: Date of Service: [name] 05/30/2020 8:00 A M Medical Record Number: [id number] Patient Account Number: [id number] Date of Birth/Sex: Treating RN: 1933/02/16 (86 y.o. [name] Primary Care Provider: Patrina Levering Other Clinician: Referring Provider: Treating Provider/Extender: [name] in Treatment: 23 Constitutional Patient is hypertensive.. Pulse regular and within target range for  patient.Marland Kitchen Respirations regular, non-labored and within target range.. Temperature is normal and within the target range for the patient.Marland Kitchen Appears in no distress. Cardiovascular Pedal pulses are intact. Integumentary (Hair, Skin) No evidence of surrounding infection. Notes Wound exam; left lateral calf has less adherent debris. No mechanical debridement today the wound was washed off with wound cleanser and gauze there is no overt surrounding infection. Electronic Signature(s) Signed: 05/30/2020 5:24:49 PM By: Linton Ham MD Entered By: Linton Ham on 05/30/2020 08:38:34 -------------------------------------------------------------------------------- Physician Orders Details Patient Name: Date of Service: [name] 05/30/2020 8:00 A M Medical Record Number: [id number] Patient Account Number: [id number] Date of Birth/Sex: Treating RN: [birth year] (86 y.o. [name] Primary Care Provider: Patrina Levering Other Clinician: Referring Provider: Treating Provider/Extender: Evelena Peat in Treatment: 27 Verbal / Phone Orders: No Diagnosis Coding ICD-10 Coding Code Description I87.321 Chronic venous hypertension (idiopathic) with  inflammation of right lower extremity L97.828 Non-pressure chronic ulcer of other part of left lower leg with other specified severity I87.322 Chronic venous hypertension (idiopathic) with inflammation of left lower extremity L97.522 Non-pressure chronic ulcer of other part of left foot with fat layer exposed Follow-up Appointments ppointment in 1 week. - Friday Return A Dressing Change Frequency Wound #5 Left,Lateral Lower Leg Do not change entire dressing for one week. Skin Barriers/Peri-Wound Care Moisturizing lotion - patient to lotion right leg every night. TCA Cream or Ointment - mixed with lotion to left leg Wound Cleansing May shower with protection. - cast protector Primary Wound Dressing Wound #5  Left,Lateral Lower Leg pplication - Will be ordered and applied next week Skin Substitute A Hydrofera Blue Wound #7 Left,Distal,Lateral Lower Leg pplication - Will be ordered and applied next week Skin Substitute A Hydrofera Blue Secondary Dressing Wound #5 Left,Lateral Lower Leg Dry Gauze ABD pad Wound #7 Left,Distal,Lateral Lower Leg Dry Gauze ABD pad Edema Control 3 Layer Compression System - Left Lower Extremity - use kerlix instead of cotton Avoid standing for long periods of time Elevate legs to the level of the heart or above for 30 minutes daily and/or when sitting, a frequency of: - throughout the day. Exercise regularly Support Garment 20-30 mm/Hg pressure to: - patient to apply farrow wrap 4000 to right leg. Apply in the morning and remove at night. Off-Loading Open toe surgical shoe to: - left Additional Orders / Instructions Other: - When you have your venous study on 06/17/2020 ensure to remove compression wrap before going into appointment to ensure you have the entire test completed. Call wound center for a nurse visit for rewrapping. Electronic Signature(s) Signed: 05/30/2020 5:07:15 PM By: Kela Millin Signed: 05/30/2020 5:24:49 PM By: Linton Ham MD Entered By: Kela Millin on 05/30/2020 08:32:22 -------------------------------------------------------------------------------- Problem List Details Patient Name: Date of Service: [name] 05/30/2020 8:00 A M Medical Record Number: [id number] Patient Account Number: [id number] Date of Birth/Sex: Treating RN: January 04, 1933 (86 y.o. [name] Primary Care Provider: Lajean Manes T Other Clinician: Referring Provider: Treating Provider/Extender: Evelena Peat in Treatment: 23 Active Problems ICD-10 Encounter Code Description Active Date MDM Diagnosis I87.321 Chronic venous hypertension (idiopathic) with inflammation of right lower 12/20/2019 No  Yes extremity L97.828 Non-pressure chronic ulcer of other part of left lower leg with other specified 03/21/2020 No Yes severity I87.322 Chronic venous hypertension (idiopathic) with inflammation of left lower 02/14/2020 No Yes extremity L97.522 Non-pressure chronic ulcer of other part of left foot with fat layer exposed 01/25/2020 No Yes Inactive Problems ICD-10 Code Description Active Date Inactive Date L97.812 Non-pressure chronic ulcer of other part of right lower leg with fat layer exposed 12/20/2019 12/20/2019 Resolved Problems Electronic Signature(s) Signed: 05/30/2020 5:24:49 PM By: Linton Ham MD Entered By: Linton Ham on 05/30/2020 08:36:43 -------------------------------------------------------------------------------- Progress Note Details Patient Name: Date of Service: [name] 05/30/2020 8:00 A M Medical Record Number: [id number] Patient Account Number: [id number] Date of Birth/Sex: Treating RN: [birth year] (86 y.o. [name] Primary Care Provider: Patrina Levering Other Clinician: Referring Provider: Treating Provider/Extender: Evelena Peat in Treatment: 23 Subjective History of Present Illness (HPI) ADMISSION 12/20/2019 This is an 86 year old woman referred by her primary physician Dr. Felipa Eth for review of a wound on her right lateral lower leg. She was actually in this clinic on 2 separate occasions in 2010 and 2012 cared for by Dr. Sherilyn Cooter. At that point in time she  had wounds on her right leg as well. She tells [place] to 1 month ago she noticed a scab building up on her right lateral lower leg this opened into a wound. There was no overt cause of this no trauma, no infection that she is aware of. She has a history of chronic venous insufficiency and wears compression stockings fairly religiously indeed she is done well over the last 8 years since she was last in this clinic. She has been applying Vaseline on this and a  covering phone. This is not progressing towards healing. Past medical history; interstitial lung disease, chronic atrial fibrillation status post pacemaker, osteoarthritis of the left knee, left breast CA, chronic repeat venous insufficiency. She takes Eliquis for her atrial fibrillation stroke prophylaxis. ABI in our clinic was 0.99 on the right 1/15; superficial wound on the right lateral calf in the setting of severe acute skin changes from chronic venous insufficiency and lymphedema. We used Iodoflex last week she complained of a lot of pain 1/22; this is a small but difficult wound on the right lateral calf in the setting of severe chronic venous insufficiency and secondary lymphedema. She continues to state the wounds things hurts when she is up on it but seems to be relieved by putting her leg up. I changed her to [facility] last week because the Iodoflex seem to be causing stinging. 1/29. This wound appears to be contracting somewhat. Changes of chronic venous insufficiency with secondary lymphedema 2/12; not as good as surface today and slightly bigger. I had to change her from Iodoflex to [facility] because of the stinging pain although she does not think it was any better on the [facility] ooShe comes into clinic today with a area on the medial left great toe. She said she noticed blood on her sock last Saturday. She had some form of bunion surgery by Dr. Ila Mcgill her podiatrist sometime in 2019 she said he "shave the area". I could not find his operative report although I did see reference to the bunion in the area. 2/19; somewhat improved wound on the right lateral lower leg however the wound she came in on the bunion of her left great toe is actually I think larger still somewhat inflamed. We have been using Iodoflex 2/26; not much improvement in either wound area which is the original venous wound on the right lateral lower leg and in the area on the bunion of her  left great toe. This still looks somewhat inflamed and tender. We have been using Iodoflex with open much improvement. 3/4; in general both her wounds look better this includes the original venous insufficiency wound on the right lateral lower leg and the area on her tip of her bunion on the left great toe medial aspect of the MTP. Change to Hydrofera Blue last week 3/12; the patient still has a small geographic shaped wound on the right lateral lower leg. We have been using Hydrofera Blue but I changed her to endoform today. She also has the area in the tip of the bunion of the left great toe. We will try Hydrofera Blue here as well 3/19; the small geographic wound on the right lateral lower leg has not filled in. There is some surrounding induration we have noticed this previously. This may be venous inflammation but I wonder about biopsying this if this is not closing up. The area over the left medial first MTP [bunion deformity] requires debridement. This is not closing in. I  am not convinced she is offloading adequately 3/26; right lateral lower leg in the setting of severe venous insufficiency and also an area over the first MTP bunion deformity. No debridement is required. We have been using endoform 4/9; right lateral lower leg wound in the setting of severe venous insufficiency and also a refractory area over the first MTP bunion deformity. The area on the right lateral lower leg is just about closed there is still a minor open area here. She comes in today with a mirror image area on the left lateral lower leg. She says this started as a scab 2 weeks ago and is gradually morphed into an open wound very similar to what she has on the right leg. She has severe bilateral venous insufficiency with venous hypertension obvious from clinical exam. 4/16; her original wound the right lateral leg wound in the setting of chronic venous hypertension is a small open area but very small. We have been  using endoform. Her wound over the left first MTP bunion deformity also appears to be small and closing in. Unfortunately she has a large relative area on the left lateral calf which was new last week. Very adherent debris over this we have been using Iodoflex however that may be contributing to the debris on the wound surface 4/23; the original wound is closed and the area on the left first metatarsal bunion deformity is also almost closed the new area from last week required debridement. She went for her reflux studies that did not take her lower extremity wraps off. This is not helpful. She did have significant reflux in the right common femoral vein. I am not going to press this issue further. She clinically has severe venous hypertension 4/29; the original wound is closed on the right lateral lower calf. The area on the left first metatarsal/bunion deformity has a small slitlike opening that is still not closed. The real problem here is now wound on the left lateral calf which is necrotic and deep. We used Iodoflex on this wound last time. Silver alginate on the left first toe The patient has Farrow wraps. We should be able to transition the right leg into compression stockings and were doing this today. 5/7; the original wound remains closed on the right lateral calf. The left first metatarsal head still is open. Deterioration in the wound which is the new wound from several weeks ago on the left lateral calf. The patient is not aware how this could have happened. We have been using Sorbact starting last week under 3 layer compression 5/14; the right lateral calf is closed the area on the first met head on the left is closed However the new area from 3 weeks ago on the left lateral calf continues to expand. There is raised nodular tissue around the wound necrotic debris on the surface. Circumference of the wound also looks necrotic. It looks as though there is involvement of the tissue under  the skin around the large area of this wound which looks threatened. She is complaining of pain. Culture of this wound I did last week showed rare coag negative staph variant I have never heard of although I am doubtful this has clinical significance 05/09/20-Patient returns with a left lateral calf wound looking about the same, the biopsies reviewed show no atypical features and consistent with trauma or pressure injury or stasis change, patient has appointment to be scheduled with vascular We are using silver alginate and 3 layer compression Patient was started  on Trental by dermatology which she has been taking for a week now 6/4; left lateral calf wound. I reviewed the dermatology notes with Dr. Martin Majestic from [facility] dermatology. 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 Put her on pentoxifylline noteworthy that she is on Eliquis as well. We have been using silver alginate under compression. She has an appointment for repeat vascular studies on 7/6. This gets back to the fact that they did not take the wrap off on the original studies that were done. I do not believe she has an arterial issue. 6/10; left lateral calf wound. I put her on Hydrofera Blue last week. Some improvement in the surface condition of the wound. She has repeat vascular studies/reflux studies on 7/6. We are trying to get Apligraf through [place] [employer]. Her husband states he looked on the website this is not going to be approved. I am really not sure if this is a "class effect" 6/18; somewhat surprisingly Apligraf was approved and 100% covered. Her husband was also quite shocked by this. Nevertheless we have her repeat vascular studies on 7/6 and I will not be able to apply this until this is done. We are using Hydrofera Blue to the wound on the left. Her original wound on the right lower leg has maintain closure using  compression stocking Objective Constitutional Patient is hypertensive.. Pulse regular and within target range for patient.Marland Kitchen Respirations regular, non-labored and within target range.. Temperature is normal and within the target range for the patient.Marland Kitchen Appears in no distress. Vitals Time Taken: 8:05 AM, Height: 69 in, Source: Stated, Weight: 163 lbs, Source: Stated, BMI: 24.1, Temperature: 97.8 F, Pulse: 48 bpm, Respiratory Rate: 18 breaths/min, Blood Pressure: 154/71 mmHg. Cardiovascular Pedal pulses are intact. General Notes: Wound exam; left lateral calf has less adherent debris. No mechanical debridement today the wound was washed off with wound cleanser and gauze there is no overt surrounding infection. Integumentary (Hair, Skin) No evidence of surrounding infection. Wound #5 status is Open. Original cause of wound was Gradually Appeared. The wound is located on the Left,Lateral Lower Leg. The wound measures 3cm length x 2cm width x 0.2cm depth; 4.712cm^2 area and 0.942cm^3 volume. There is Fat Layer (Subcutaneous Tissue) Exposed exposed. There is no tunneling or undermining noted. There is a medium amount of serosanguineous drainage noted. The wound margin is flat and intact. There is medium (34-66%) red granulation within the wound bed. There is a medium (34-66%) amount of necrotic tissue within the wound bed including Adherent Slough. Wound #7 status is Open. Original cause of wound was Gradually Appeared. The wound is located on the Left,Distal,Lateral Lower Leg. The wound measures 0.4cm length x 0.5cm width x 0.1cm depth; 0.157cm^2 area and 0.016cm^3 volume. There is Fat Layer (Subcutaneous Tissue) Exposed exposed. There is no tunneling or undermining noted. There is a small amount of serosanguineous drainage noted. The wound margin is flat and intact. There is no granulation within the wound bed. There is a large (67-100%) amount of necrotic tissue within the wound bed including  Adherent Slough. Assessment Active Problems ICD-10 Chronic venous hypertension (idiopathic) with inflammation of right lower extremity Non-pressure chronic ulcer of other part of left lower leg with other specified severity Chronic venous hypertension (idiopathic) with inflammation of left lower extremity Non-pressure chronic ulcer of other part of left foot with fat layer exposed Procedures Wound #5 Pre-procedure diagnosis of Wound #5 is a Venous Leg Ulcer  located on the Left,Lateral Lower Leg . There was a Three Layer Compression Therapy Procedure by Deon Pilling, RN. Post procedure Diagnosis Wound #5: Same as Pre-Procedure Wound #7 Pre-procedure diagnosis of Wound #7 is a Venous Leg Ulcer located on the Left,Distal,Lateral Lower Leg . There was a Three Layer Compression Therapy Procedure by Deon Pilling, RN. Post procedure Diagnosis Wound #7: Same as Pre-Procedure Plan Follow-up Appointments: Return Appointment in 1 week. - Friday Dressing Change Frequency: Wound #5 Left,Lateral Lower Leg: Do not change entire dressing for one week. Skin Barriers/Peri-Wound Care: Moisturizing lotion - patient to lotion right leg every night. TCA Cream or Ointment - mixed with lotion to left leg Wound Cleansing: May shower with protection. - cast protector Primary Wound Dressing: Wound #5 Left,Lateral Lower Leg: Skin Substitute Application - Will be ordered and applied next week Hydrofera Blue Wound #7 Left,Distal,Lateral Lower Leg: Skin Substitute Application - Will be ordered and applied next week Hydrofera Blue Secondary Dressing: Wound #5 Left,Lateral Lower Leg: Dry Gauze ABD pad Wound #7 Left,Distal,Lateral Lower Leg: Dry Gauze ABD pad Edema Control: 3 Layer Compression System - Left Lower Extremity - use kerlix instead of cotton Avoid standing for long periods of time Elevate legs to the level of the heart or above for 30 minutes daily and/or when sitting, a frequency of: -  throughout the day. Exercise regularly Support Garment 20-30 mm/Hg pressure to: - patient to apply farrow wrap 4000 to right leg. Apply in the morning and remove at night. Off-Loading: Open toe surgical shoe to: - left Additional Orders / Instructions: Other: - When you have your venous study on 06/17/2020 ensure to remove compression wrap before going into appointment to ensure you have the entire test completed. Call wound center for a nurse visit for rewrapping. 1. Continue with Hydrofera Blue/ABDs under 3 layer compression 2. I will not be able to apply the Apligraf until the week of 7/ 6 i.e. after her vascular studies are repeated Electronic Signature(s) Signed: 05/30/2020 5:24:49 PM By: Linton Ham MD Entered By: Linton Ham on 05/30/2020 08:40:19 -------------------------------------------------------------------------------- SuperBill Details Patient Name: Date of Service: [name] 05/30/2020 Medical Record Number: [id number] Patient Account Number: [id number] Date of Birth/Sex: Treating RN: [id number] (86 y.o. [name] Primary Care Provider: Lajean Manes T Other Clinician: Referring Provider: Treating Provider/Extender: Evelena Peat in Treatment: 23 Diagnosis Coding ICD-10 Codes Code Description [phone] Chronic venous hypertension (idiopathic) with inflammation of right lower extremity L97.828 Non-pressure chronic ulcer of other part of left lower leg with other specified severity I87.322 Chronic venous hypertension (idiopathic) with inflammation of left lower extremity L97.522 Non-pressure chronic ulcer of other part of left foot with fat layer exposed Facility Procedures The patient participates with Medicare or their insurance follows the Medicare Facility Guidelines: CPT4 Code Description Modifier Quantity [id number] (Facility Use Only) [phone] - [place] [facility] Physician Procedures : CPT4 Code  Description Modifier [id number] - WC PHYS LEVEL 3 - EST PT ICD-10 Diagnosis Description I87.321 Chronic venous hypertension (idiopathic) with inflammation of right lower extremity L97.828 Non-pressure chronic ulcer of other part of left  lower leg with other specified severity Quantity: 1 Electronic Signature(s) Signed: 05/30/2020 5:24:49 PM By: Linton Ham MD Entered By: Linton Ham on 05/30/2020 08:40:38

## 2020-06-06 ENCOUNTER — Encounter (HOSPITAL_BASED_OUTPATIENT_CLINIC_OR_DEPARTMENT_OTHER): Payer: Medicare Other | Admitting: Internal Medicine

## 2020-06-06 ENCOUNTER — Telehealth: Payer: Self-pay | Admitting: Internal Medicine

## 2020-06-06 DIAGNOSIS — I87333 Chronic venous hypertension (idiopathic) with ulcer and inflammation of bilateral lower extremity: Secondary | ICD-10-CM | POA: Diagnosis not present

## 2020-06-06 NOTE — Telephone Encounter (Signed)
Patient called  [employer] tech services, monitor inoperable- 7-10 days before new monitor will be received. Will come in for in clinic check of device due to patient concern over pulse readings. Aware that Covid policy still in place for appointments and spouse will not be able to attend with the patient.

## 2020-06-06 NOTE — Telephone Encounter (Signed)
Spoke with patient and she reports no change in how she feels. Patient reports that at her wound check the automatic BP  Cuff gave a pulse reading in the 30's and the person taking her VS did a manual pulse and reported it was 38 bpm. Patient's husband took her pulse with automatic BP cuff at home and pulse reading was 40. She reports no CP, no SOB, no dizziness, and no syncope. Last remote transmission on 05/27/20 showed lower rate programmed at 70 bpm on pacemaker, and device function WNL. Patient has hx of AF and reassured patient that automatic BP cuffs are not as acurate as the pacemaker at determining pulse rates with AF due to the irregularity of the pulse. Patient attempted to send manual transmission but received error message of "?" when attempted to send remote transmission. Patient will contact [employer] tech services for assistance sending remote transmission and troubleshooting remote monitor. [employer] # provided and to contact office at device clinic # after transmission sent. ED precautions given for CP, SOB, dizziness or syncope.

## 2020-06-06 NOTE — Telephone Encounter (Signed)
   STAT if HR is under 50 or over 120 (normal HR is 60-100 beats per minute)  1) What is your heart rate? 38  2) Do you have a log of your heart rate readings (document readings)? 38, 40  3) Do you have any other symptoms? Pt's husband said they went to foot doctor to check her foot ulcer, they checked her HR there and it's at 73 and 40. Pt denied any other symptoms. They wanted to check in with Dr. Lovena Le for recommendation

## 2020-06-06 NOTE — Progress Notes (Signed)
[name], [name] ([id number]) Visit Report for 06/06/2020 Debridement Details Patient Name: Date of Service: [place], [name] 06/06/2020 8:00 A M Medical Record Number: [id number] Patient Account Number: [id number] Date of Birth/Sex: Treating RN: [birth year] (86 y.o. [name] Primary Care Provider: Lajean Manes T Other Clinician: Referring Provider: Treating Provider/Extender: Evelena Peat in Treatment: 24 Debridement Performed for Assessment: Wound #5 Left,Lateral Lower Leg Performed By: Physician Ricard Dillon., MD Debridement Type: Debridement Severity of Tissue Pre Debridement: Fat layer exposed Level of Consciousness (Pre-procedure): Awake and Alert Pre-procedure Verification/Time Out Yes - 08:28 Taken: Start Time: 08:28 Pain Control: Other : benzocaine, 20% T Area Debrided (L x W): otal 3 (cm) x 2.5 (cm) = 7.5 (cm) Tissue and other material debrided: Viable, Non-Viable, Subcutaneous, Skin: Dermis Level: Skin/Subcutaneous Tissue Debridement Description: Excisional Instrument: Curette Bleeding: Minimum Hemostasis Achieved: Pressure End Time: 08:29 Procedural Pain: 1 Post Procedural Pain: 0 Response to Treatment: Procedure was tolerated well Level of Consciousness (Post- Awake and Alert procedure): Post Debridement Measurements of Total Wound Length: (cm) 3 Width: (cm) 2.5 Depth: (cm) 0.2 Volume: (cm) 1.178 Character of Wound/Ulcer Post Debridement: Requires Further Debridement Severity of Tissue Post Debridement: Fat layer exposed Post Procedure Diagnosis Same as Pre-procedure Electronic Signature(s) Signed: 06/06/2020 5:01:22 PM By: Kela Millin Signed: 06/06/2020 5:27:17 PM By: Linton Ham MD Entered By: Linton Ham on 06/06/2020 08:34:54 -------------------------------------------------------------------------------- HPI Details Patient Name: Date of Service: Tracy Banker J. 06/06/2020 8:00 A M Medical  Record Number: [id number] Patient Account Number: [id number] Date of Birth/Sex: Treating RN: 04/06/1933 (86 y.o. [name] Primary Care Provider: Patrina Levering Other Clinician: Referring Provider: Treating Provider/Extender: Evelena Peat in Treatment: 24 History of Present Illness HPI Description: ADMISSION 12/20/2019 This is an 86 year old woman referred by her primary physician Dr. Felipa Eth for review of a wound on her right lateral lower leg. She was actually in this clinic on 2 separate occasions in 2010 and 2012 cared for by Dr. Sherilyn Cooter. At that point in time she had wounds on her right leg as well. She tells [place] to 1 month ago she noticed a scab building up on her right lateral lower leg this opened into a wound. There was no overt cause of this no trauma, no infection that she is aware of. She has a history of chronic venous insufficiency and wears compression stockings fairly religiously indeed she is done well over the last 8 years since she was last in this clinic. She has been applying Vaseline on this and a covering phone. This is not progressing towards healing. Past medical history; interstitial lung disease, chronic atrial fibrillation status post pacemaker, osteoarthritis of the left knee, left breast CA, chronic repeat venous insufficiency. She takes Eliquis for her atrial fibrillation stroke prophylaxis. ABI in our clinic was 0.99 on the right 1/15; superficial wound on the right lateral calf in the setting of severe acute skin changes from chronic venous insufficiency and lymphedema. We used Iodoflex last week she complained of a lot of pain 1/22; this is a small but difficult wound on the right lateral calf in the setting of severe chronic venous insufficiency and secondary lymphedema. She continues to state the wounds things hurts when she is up on it but seems to be relieved by putting her leg up. I changed her to [facility] last  week because the Iodoflex seem to be causing stinging. 1/29. This wound appears to be contracting somewhat. Changes of  chronic venous insufficiency with secondary lymphedema 2/12; not as good as surface today and slightly bigger. I had to change her from Iodoflex to [facility] because of the stinging pain although she does not think it was any better on the [facility]. She comes into clinic today with a area on the medial left great toe. She said she noticed blood on her sock last Saturday. She had some form of bunion surgery by Dr. Ila Mcgill her podiatrist sometime in 2019 she said he "shave the area". I could not find his operative report although I did see reference to the bunion in the area. 2/19; somewhat improved wound on the right lateral lower leg however the wound she came in on the bunion of her left great toe is actually I think larger still somewhat inflamed. We have been using Iodoflex 2/26; not much improvement in either wound area which is the original venous wound on the right lateral lower leg and in the area on the bunion of her left great toe. This still looks somewhat inflamed and tender. We have been using Iodoflex with open much improvement. 3/4; in general both her wounds look better this includes the original venous insufficiency wound on the right lateral lower leg and the area on her tip of her bunion on the left great toe medial aspect of the MTP. Change to Hydrofera Blue last week 3/12; the patient still has a small geographic shaped wound on the right lateral lower leg. We have been using Hydrofera Blue but I changed her to endoform today. She also has the area in the tip of the bunion of the left great toe. We will try Hydrofera Blue here as well 3/19; the small geographic wound on the right lateral lower leg has not filled in. There is some surrounding induration we have noticed this previously. This may be venous inflammation but I wonder about biopsying  this if this is not closing up. The area over the left medial first MTP [bunion deformity] requires debridement. This is not closing in. I am not convinced she is offloading adequately 3/26; right lateral lower leg in the setting of severe venous insufficiency and also an area over the first MTP bunion deformity. No debridement is required. We have been using endoform 4/9; right lateral lower leg wound in the setting of severe venous insufficiency and also a refractory area over the first MTP bunion deformity. The area on the right lateral lower leg is just about closed there is still a minor open area here. She comes in today with a mirror image area on the left lateral lower leg. She says this started as a scab 2 weeks ago and is gradually morphed into an open wound very similar to what she has on the right leg. She has severe bilateral venous insufficiency with venous hypertension obvious from clinical exam. 4/16; her original wound the right lateral leg wound in the setting of chronic venous hypertension is a small open area but very small. We have been using endoform. Her wound over the left first MTP bunion deformity also appears to be small and closing in. Unfortunately she has a large relative area on the left lateral calf which was new last week. Very adherent debris over this we have been using Iodoflex however that may be contributing to the debris on the wound surface 4/23; the original wound is closed and the area on the left first metatarsal bunion deformity is also almost closed the new area from  last week required debridement. She went for her reflux studies that did not take her lower extremity wraps off. This is not helpful. She did have significant reflux in the right common femoral vein. I am not going to press this issue further. She clinically has severe venous hypertension 4/29; the original wound is closed on the right lateral lower calf. The area on the left first  metatarsal/bunion deformity has a small slitlike opening that is still not closed. The real problem here is now wound on the left lateral calf which is necrotic and deep. We used Iodoflex on this wound last time. Silver alginate on the left first toe The patient has Farrow wraps. We should be able to transition the right leg into compression stockings and were doing this today. 5/7; the original wound remains closed on the right lateral calf. The left first metatarsal head still is open. Deterioration in the wound which is the new wound from several weeks ago on the left lateral calf. The patient is not aware how this could have happened. We have been using Sorbact starting last week under 3 layer compression 5/14; the right lateral calf is closed the area on the first met head on the left is closed However the new area from 3 weeks ago on the left lateral calf continues to expand. There is raised nodular tissue around the wound necrotic debris on the surface. Circumference of the wound also looks necrotic. It looks as though there is involvement of the tissue under the skin around the large area of this wound which looks threatened. She is complaining of pain. Culture of this wound I did last week showed rare coag negative staph variant I have never heard of although I am doubtful this has clinical significance 05/09/20-Patient returns with a left lateral calf wound looking about the same, the biopsies reviewed show no atypical features and consistent with trauma or pressure injury or stasis change, patient has appointment to be scheduled with vascular We are using silver alginate and 3 layer compression Patient was started on Trental by dermatology which she has been taking for a week now 6/4; left lateral calf wound. I reviewed the dermatology notes with Dr. Martin Majestic from [facility] dermatology. See suggested the possibility of livedoid vasculopathy possibility of additional biopsies which I think  would have to be punch biopsies. Put her on pentoxifylline noteworthy that she is on Eliquis as well. We have been using silver alginate under compression. She has an appointment for repeat vascular studies on 7/6. This gets back to the fact that they did not take the wrap off on the original studies that were done. I do not believe she has an arterial issue. 6/10; left lateral calf wound. I put her on Hydrofera Blue last week. Some improvement in the surface condition of the wound. She has repeat vascular studies/reflux studies on 7/6. We are trying to get Apligraf through [place] [employer]. Her husband states he looked on the website this is not going to be approved. I am really not sure if this is a "class effect" 6/18; somewhat surprisingly Apligraf was approved and 100% covered. Her husband was also quite shocked by this. Nevertheless we have her repeat vascular studies on 7/6 and I will not be able to apply this until this is done. We are using Hydrofera Blue to the wound on the left. Her original wound on the right lower leg has maintain closure using compression stocking 6/25; repeat vascular studies/venous on 7/6; we will apply  the Apligraf I think that week. We are also going to work around a week's vacation they have the first week in August. Psychologist, prison and probation services) Signed: 06/06/2020 5:27:17 PM By: Baltazar Najjar MD Entered By: Baltazar Najjar on 06/06/2020 08:35:54 -------------------------------------------------------------------------------- Physical Exam Details Patient Name: Date of Service: Tracy [place]. 06/06/2020 8:00 A M Medical Record Number: [id number] Patient Account Number: [id number] Date of Birth/Sex: Treating RN: 1933-01-29 (86 y.o. Harvest Dark Primary Care Provider: Estrellita Ludwig Other Clinician: Referring Provider: Treating Provider/Extender: Addison Naegeli Weeks in Treatment: 24 Constitutional Patient is hypertensive.. Pulse  regular and within target range for patient.Marland Kitchen Respirations regular, non-labored and within target range.. Temperature is normal and within the target range for the patient.Marland Kitchen Appears in no distress. Notes Wound exam; left lateral calf still adherent debris under illumination although this debrides fairly easily there is a gritty fibrinous necrotic debris as well as some surface slough and nonviable subcutaneous tissue at the circumference. All of this I removed with a #3 curette there was minimal bleeding hemostasis with direct pressure Electronic Signature(s) Signed: 06/06/2020 5:27:17 PM By: Baltazar Najjar MD Entered By: Baltazar Najjar on 06/06/2020 08:36:39 -------------------------------------------------------------------------------- Physician Orders Details Patient Name: Date of Service: Tracy [place]. 06/06/2020 8:00 A M Medical Record Number: [id number] Patient Account Number: [id number] Date of Birth/Sex: Treating RN: [birth year] (86 y.o. Harvest Dark Primary Care Provider: Merlene Laughter T Other Clinician: Referring Provider: Treating Provider/Extender: Shella Spearing in Treatment: 24 Verbal / Phone Orders: No Diagnosis Coding ICD-10 Coding Code Description I87.321 Chronic venous hypertension (idiopathic) with inflammation of right lower extremity L97.828 Non-pressure chronic ulcer of other part of left lower leg with other specified severity I87.322 Chronic venous hypertension (idiopathic) with inflammation of left lower extremity L97.522 Non-pressure chronic ulcer of other part of left foot with fat layer exposed Follow-up Appointments ppointment in 1 week. - Friday Return A Dressing Change Frequency Wound #5 Left,Lateral Lower Leg Do not change entire dressing for one week. Skin Barriers/Peri-Wound Care Moisturizing lotion - patient to lotion right leg every night. TCA Cream or Ointment - mixed with lotion to left leg Wound  Cleansing May shower with protection. - cast protector Primary Wound Dressing Wound #5 Left,Lateral Lower Leg Hydrofera Blue Wound #7 Left,Distal,Lateral Lower Leg Hydrofera Blue Secondary Dressing Wound #5 Left,Lateral Lower Leg Dry Gauze ABD pad Wound #7 Left,Distal,Lateral Lower Leg Dry Gauze ABD pad Edema Control 3 Layer Compression System - Left Lower Extremity - use kerlix instead of cotton Avoid standing for long periods of time Elevate legs to the level of the heart or above for 30 minutes daily and/or when sitting, a frequency of: - throughout the day. Exercise regularly Support Garment 20-30 mm/Hg pressure to: - patient to apply farrow wrap 4000 to right leg. Apply in the morning and remove at night. Off-Loading Open toe surgical shoe to: - left Additional Orders / Instructions Other: - When you have your venous study on 06/17/2020 ensure to remove compression wrap before going into appointment to ensure you have the entire test completed. Call wound center for a nurse visit for rewrapping. Electronic Signature(s) Signed: 06/06/2020 5:01:22 PM By: Cherylin Mylar Signed: 06/06/2020 5:27:17 PM By: Baltazar Najjar MD Entered By: Cherylin Mylar on 06/06/2020 08:32:12 -------------------------------------------------------------------------------- Problem List Details Patient Name: Date of Service: Tracy Marse[place]. 06/06/2020 8:00 A M Medical Record Number: [id number] Patient Account Number: [id number] Date of Birth/Sex: Treating RN: 10/01/1933 (86 y.o. F) Dwiggins, Carollee Herter Primary  Care Provider: Lajean Manes T Other Clinician: Referring Provider: Treating Provider/Extender: Evelena Peat in Treatment: 24 Active Problems ICD-10 Encounter Code Description Active Date MDM Diagnosis I87.321 Chronic venous hypertension (idiopathic) with inflammation of right lower 12/20/2019 No Yes extremity L97.828 Non-pressure chronic ulcer of other part  of left lower leg with other specified 03/21/2020 No Yes severity I87.322 Chronic venous hypertension (idiopathic) with inflammation of left lower 02/14/2020 No Yes extremity L97.522 Non-pressure chronic ulcer of other part of left foot with fat layer exposed 01/25/2020 No Yes Inactive Problems ICD-10 Code Description Active Date Inactive Date L97.812 Non-pressure chronic ulcer of other part of right lower leg with fat layer exposed 12/20/2019 12/20/2019 Resolved Problems Electronic Signature(s) Signed: 06/06/2020 5:27:17 PM By: Linton Ham MD Entered By: Linton Ham on 06/06/2020 08:34:38 -------------------------------------------------------------------------------- Progress Note Details Patient Name: Date of Service: Tracy Banker J. 06/06/2020 8:00 A M Medical Record Number: [id number] Patient Account Number: [id number] Date of Birth/Sex: Treating RN: [birth year] (86 y.o. [name] Primary Care Provider: Patrina Levering Other Clinician: Referring Provider: Treating Provider/Extender: Evelena Peat in Treatment: 24 Subjective History of Present Illness (HPI) ADMISSION 12/20/2019 This is an 86 year old woman referred by her primary physician Dr. Felipa Eth for review of a wound on her right lateral lower leg. She was actually in this clinic on 2 separate occasions in 2010 and 2012 cared for by Dr. Sherilyn Cooter. At that point in time she had wounds on her right leg as well. She tells [place] to 1 month ago she noticed a scab building up on her right lateral lower leg this opened into a wound. There was no overt cause of this no trauma, no infection that she is aware of. She has a history of chronic venous insufficiency and wears compression stockings fairly religiously indeed she is done well over the last 8 years since she was last in this clinic. She has been applying Vaseline on this and a covering phone. This is not progressing towards healing. Past medical  history; interstitial lung disease, chronic atrial fibrillation status post pacemaker, osteoarthritis of the left knee, left breast CA, chronic repeat venous insufficiency. She takes Eliquis for her atrial fibrillation stroke prophylaxis. ABI in our clinic was 0.99 on the right 1/15; superficial wound on the right lateral calf in the setting of severe acute skin changes from chronic venous insufficiency and lymphedema. We used Iodoflex last week she complained of a lot of pain 1/22; this is a small but difficult wound on the right lateral calf in the setting of severe chronic venous insufficiency and secondary lymphedema. She continues to state the wounds things hurts when she is up on it but seems to be relieved by putting her leg up. I changed her to [facility] last week because the Iodoflex seem to be causing stinging. 1/29. This wound appears to be contracting somewhat. Changes of chronic venous insufficiency with secondary lymphedema 2/12; not as good as surface today and slightly bigger. I had to change her from Iodoflex to [facility] because of the stinging pain although she does not think it was any better on the [facility]. ooShe comes into clinic today with a area on the medial left great toe. She said she noticed blood on her sock last Saturday. She had some form of bunion surgery by Dr. Ila Mcgill her podiatrist sometime in 2019 she said he "shave the area". I could not find his operative report although I did see  reference to the bunion in the area. 2/19; somewhat improved wound on the right lateral lower leg however the wound she came in on the bunion of her left great toe is actually I think larger still somewhat inflamed. We have been using Iodoflex 2/26; not much improvement in either wound area which is the original venous wound on the right lateral lower leg and in the area on the bunion of her left great toe. This still looks somewhat inflamed and tender. We have  been using Iodoflex with open much improvement. 3/4; in general both her wounds look better this includes the original venous insufficiency wound on the right lateral lower leg and the area on her tip of her bunion on the left great toe medial aspect of the MTP. Change to Hydrofera Blue last week 3/12; the patient still has a small geographic shaped wound on the right lateral lower leg. We have been using Hydrofera Blue but I changed her to endoform today. She also has the area in the tip of the bunion of the left great toe. We will try Hydrofera Blue here as well 3/19; the small geographic wound on the right lateral lower leg has not filled in. There is some surrounding induration we have noticed this previously. This may be venous inflammation but I wonder about biopsying this if this is not closing up. The area over the left medial first MTP [bunion deformity] requires debridement. This is not closing in. I am not convinced she is offloading adequately 3/26; right lateral lower leg in the setting of severe venous insufficiency and also an area over the first MTP bunion deformity. No debridement is required. We have been using endoform 4/9; right lateral lower leg wound in the setting of severe venous insufficiency and also a refractory area over the first MTP bunion deformity. The area on the right lateral lower leg is just about closed there is still a minor open area here. She comes in today with a mirror image area on the left lateral lower leg. She says this started as a scab 2 weeks ago and is gradually morphed into an open wound very similar to what she has on the right leg. She has severe bilateral venous insufficiency with venous hypertension obvious from clinical exam. 4/16; her original wound the right lateral leg wound in the setting of chronic venous hypertension is a small open area but very small. We have been using endoform. Her wound over the left first MTP bunion deformity also  appears to be small and closing in. Unfortunately she has a large relative area on the left lateral calf which was new last week. Very adherent debris over this we have been using Iodoflex however that may be contributing to the debris on the wound surface 4/23; the original wound is closed and the area on the left first metatarsal bunion deformity is also almost closed the new area from last week required debridement. She went for her reflux studies that did not take her lower extremity wraps off. This is not helpful. She did have significant reflux in the right common femoral vein. I am not going to press this issue further. She clinically has severe venous hypertension 4/29; the original wound is closed on the right lateral lower calf. The area on the left first metatarsal/bunion deformity has a small slitlike opening that is still not closed. The real problem here is now wound on the left lateral calf which is necrotic and deep. We used Iodoflex on  this wound last time. Silver alginate on the left first toe The patient has Farrow wraps. We should be able to transition the right leg into compression stockings and were doing this today. 5/7; the original wound remains closed on the right lateral calf. The left first metatarsal head still is open. Deterioration in the wound which is the new wound from several weeks ago on the left lateral calf. The patient is not aware how this could have happened. We have been using Sorbact starting last week under 3 layer compression 5/14; the right lateral calf is closed the area on the first met head on the left is closed However the new area from 3 weeks ago on the left lateral calf continues to expand. There is raised nodular tissue around the wound necrotic debris on the surface. Circumference of the wound also looks necrotic. It looks as though there is involvement of the tissue under the skin around the large area of this wound which looks threatened. She  is complaining of pain. Culture of this wound I did last week showed rare coag negative staph variant I have never heard of although I am doubtful this has clinical significance 05/09/20-Patient returns with a left lateral calf wound looking about the same, the biopsies reviewed show no atypical features and consistent with trauma or pressure injury or stasis change, patient has appointment to be scheduled with vascular We are using silver alginate and 3 layer compression Patient was started on Trental by dermatology which she has been taking for a week now 6/4; left lateral calf wound. I reviewed the dermatology notes with Dr. Martin Majestic from [facility] dermatology. 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 Put her on pentoxifylline noteworthy that she is on Eliquis as well. We have been using silver alginate under compression. She has an appointment for repeat vascular studies on 7/6. This gets back to the fact that they did not take the wrap off on the original studies that were done. I do not believe she has an arterial issue. 6/10; left lateral calf wound. I put her on Hydrofera Blue last week. Some improvement in the surface condition of the wound. She has repeat vascular studies/reflux studies on 7/6. We are trying to get Apligraf through [place] [facility]. Her husband states he looked on the website this is not going to be approved. I am really not sure if this is a "class effect" 6/18; somewhat surprisingly Apligraf was approved and 100% covered. Her husband was also quite shocked by this. Nevertheless we have her repeat vascular studies on 7/6 and I will not be able to apply this until this is done. We are using Hydrofera Blue to the wound on the left. Her original wound on the right lower leg has maintain closure using compression stocking 6/25; repeat vascular studies/venous on 7/6; we will apply  the Apligraf I think that week. We are also going to work around a week's vacation they have the first week in August. Objective Constitutional Patient is hypertensive.. Pulse regular and within target range for patient.Marland Kitchen Respirations regular, non-labored and within target range.. Temperature is normal and within the target range for the patient.Marland Kitchen Appears in no distress. Vitals Time Taken: 8:00 AM, Height: 69 in, Weight: 163 lbs, BMI: 24.1, Temperature: 97.7 F, Pulse: 42 bpm, Respiratory Rate: 16 breaths/min, Blood Pressure: 147/75 mmHg. General Notes: Wound exam; left lateral calf still adherent debris under illumination although this debrides  fairly easily there is a gritty fibrinous necrotic debris as well as some surface slough and nonviable subcutaneous tissue at the circumference. All of this I removed with a #3 curette there was minimal bleeding hemostasis with direct pressure Integumentary (Hair, Skin) Wound #5 status is Open. Original cause of wound was Gradually Appeared. The wound is located on the Left,Lateral Lower Leg. The wound measures 3cm length x 2.5cm width x 0.2cm depth; 5.89cm^2 area and 1.178cm^3 volume. There is Fat Layer (Subcutaneous Tissue) Exposed exposed. There is no tunneling or undermining noted. There is a medium amount of serosanguineous drainage noted. The wound margin is flat and intact. There is medium (34-66%) red granulation within the wound bed. There is a medium (34-66%) amount of necrotic tissue within the wound bed including Adherent Slough. Wound #7 status is Open. Original cause of wound was Gradually Appeared. The wound is located on the Left,Distal,Lateral Lower Leg. The wound measures 0.5cm length x 0.8cm width x 0.1cm depth; 0.314cm^2 area and 0.031cm^3 volume. There is Fat Layer (Subcutaneous Tissue) Exposed exposed. There is no tunneling or undermining noted. There is a medium amount of serosanguineous drainage noted. The wound margin is flat and  intact. There is no granulation within the wound bed. There is a large (67-100%) amount of necrotic tissue within the wound bed including Adherent Slough. Assessment Active Problems ICD-10 Chronic venous hypertension (idiopathic) with inflammation of right lower extremity Non-pressure chronic ulcer of other part of left lower leg with other specified severity Chronic venous hypertension (idiopathic) with inflammation of left lower extremity Non-pressure chronic ulcer of other part of left foot with fat layer exposed Procedures Wound #5 Pre-procedure diagnosis of Wound #5 is a Venous Leg Ulcer located on the Left,Lateral Lower Leg .Severity of Tissue Pre Debridement is: Fat layer exposed. There was a Excisional Skin/Subcutaneous Tissue Debridement with a total area of 7.5 sq cm performed by Ricard Dillon., MD. With the following instrument(s): Curette to remove Viable and Non-Viable tissue/material. Material removed includes Subcutaneous Tissue and Skin: Dermis and after achieving pain control using Other (benzocaine, 20%). No specimens were taken. A time out was conducted at 08:28, prior to the start of the procedure. A Minimum amount of bleeding was controlled with Pressure. The procedure was tolerated well with a pain level of 1 throughout and a pain level of 0 following the procedure. Post Debridement Measurements: 3cm length x 2.5cm width x 0.2cm depth; 1.178cm^3 volume. Character of Wound/Ulcer Post Debridement requires further debridement. Severity of Tissue Post Debridement is: Fat layer exposed. Post procedure Diagnosis Wound #5: Same as Pre-Procedure Pre-procedure diagnosis of Wound #5 is a Venous Leg Ulcer located on the Left,Lateral Lower Leg . There was a Three Layer Compression Therapy Procedure by [name], RN. Post procedure Diagnosis Wound #5: Same as Pre-Procedure Wound #7 Pre-procedure diagnosis of Wound #7 is a Venous Leg Ulcer located on the Left,Distal,Lateral  Lower Leg . There was a Three Layer Compression Therapy Procedure by [name], RN. Post procedure Diagnosis Wound #7: Same as Pre-Procedure Plan Follow-up Appointments: Return Appointment in 1 week. - Friday Dressing Change Frequency: Wound #5 Left,Lateral Lower Leg: Do not change entire dressing for one week. Skin Barriers/Peri-Wound Care: Moisturizing lotion - patient to lotion right leg every night. TCA Cream or Ointment - mixed with lotion to left leg Wound Cleansing: May shower with protection. - cast protector Primary Wound Dressing: Wound #5 Left,Lateral Lower Leg: Hydrofera Blue Wound #7 Left,Distal,Lateral Lower Leg: Hydrofera Blue Secondary Dressing: Wound #5  Left,Lateral Lower Leg: Dry Gauze ABD pad Wound #7 Left,Distal,Lateral Lower Leg: Dry Gauze ABD pad Edema Control: 3 Layer Compression System - Left Lower Extremity - use kerlix instead of cotton Avoid standing for long periods of time Elevate legs to the level of the heart or above for 30 minutes daily and/or when sitting, a frequency of: - throughout the day. Exercise regularly Support Garment 20-30 mm/Hg pressure to: - patient to apply farrow wrap 4000 to right leg. Apply in the morning and remove at night. Off-Loading: Open toe surgical shoe to: - left Additional Orders / Instructions: Other: - When you have your venous study on 06/17/2020 ensure to remove compression wrap before going into appointment to ensure you have the entire test completed. Call wound center for a nurse visit for rewrapping. 1. Hydrofera Blue continues as a primary dressing 2. Plan is for Apligraf during the week after next Electronic Signature(s) Signed: 06/06/2020 5:27:17 PM By: Linton Ham MD Entered By: Linton Ham on 06/06/2020 08:37:23 -------------------------------------------------------------------------------- SuperBill Details Patient Name: Date of Service: Vert, [name] 06/06/2020 Medical Record  Number: [id number] Patient Account Number: [id number] Date of Birth/Sex: Treating RN: [birth year] (86 y.o. [name] Primary Care Provider: Lajean Manes T Other Clinician: Referring Provider: Treating Provider/Extender: Evelena Peat in Treatment: 24 Diagnosis Coding ICD-10 Codes Code Description [phone] Chronic venous hypertension (idiopathic) with inflammation of right lower extremity L97.828 Non-pressure chronic ulcer of other part of left lower leg with other specified severity I87.322 Chronic venous hypertension (idiopathic) with inflammation of left lower extremity L97.522 Non-pressure chronic ulcer of other part of left foot with fat layer exposed Facility Procedures The patient participates with Medicare or their insurance follows the Medicare Facility Guidelines: CPT4 Code Description Modifier Quantity [id number] 11042 - DEB SUBQ TISSUE 20 SQ CM/< 1 ICD-10 Diagnosis Description L97.828 Non-pressure chronic ulcer of  other part of left lower leg with other specified severity Physician Procedures : CPT4 Code Description Modifier [id number] 11042 - WC PHYS SUBQ TISS 20 SQ CM ICD-10 Diagnosis Description L97.828 Non-pressure chronic ulcer of other part of left lower leg with other specified severity Quantity: 1 Electronic Signature(s) Signed: 06/06/2020 5:27:17 PM By: Linton Ham MD Entered By: Linton Ham on 06/06/2020 08:37:33

## 2020-06-06 NOTE — Progress Notes (Signed)
JERRICA, THORMAN ([id number]) Visit Report for 06/06/2020 Arrival Information Details Patient Name: Date of Service: [place], [name] 06/06/2020 8:00 A M Medical Record Number: [id number] Patient Account Number: [id number] Date of Birth/Sex: Treating RN: [birth year] (86 y.o. [name] Primary Care Lilleigh Hechavarria: Lajean Manes T Other Clinician: Referring Nyeli Holtmeyer: Treating Ciel Chervenak/Extender: Evelena Peat in Treatment: 24 Visit Information History Since Last Visit Added or deleted any medications: No Patient Arrived: Ambulatory Any new allergies or adverse reactions: No Arrival Time: 07:58 Had a fall or experienced change in No Accompanied By: husband activities of daily living that may affect Transfer Assistance: None risk of falls: Patient Identification Verified: Yes Signs or symptoms of abuse/neglect since last visito No Secondary Verification Process Completed: Yes Hospitalized since last visit: No Patient Requires Transmission-Based Precautions: No Implantable device outside of the clinic excluding No Patient Has Alerts: Yes cellular tissue based products placed in the center Patient Alerts: Patient on Blood Thinner since last visit: Right ABI:0.99 Has Dressing in Place as Prescribed: Yes Has Compression in Place as Prescribed: Yes Pain Present Now: Yes Electronic Signature(s) Signed: 06/06/2020 5:05:03 PM By: Deon Pilling Entered By: Deon Pilling on 06/06/2020 08:01:04 -------------------------------------------------------------------------------- Compression Therapy Details Patient Name: Date of Service: [name] 06/06/2020 8:00 A M Medical Record Number: [id number] Patient Account Number: [id number] Date of Birth/Sex: Treating RN: [birth year] (86 y.o. [name] Primary Care Stephonie Wilcoxen: Patrina Levering Other Clinician: Referring Acxel Dingee: Treating Genevra Orne/Extender: [name] in Treatment:  24 Compression Therapy Performed for Wound Assessment: Wound #5 Left,Lateral Lower Leg Performed By: Clinician [name], RN Compression Type: Three Layer Post Procedure Diagnosis Same as Pre-procedure Electronic Signature(s) Signed: 06/06/2020 5:01:22 PM By: Kela Millin Entered By: Kela Millin on 06/06/2020 08:31:39 -------------------------------------------------------------------------------- Compression Therapy Details Patient Name: Date of Service: [name] 06/06/2020 8:00 A M Medical Record Number: [id number] Patient Account Number: [id number] Date of Birth/Sex: Treating RN: 12-16-1932 (86 y.o. [name] Primary Care Aryah Doering: Patrina Levering Other Clinician: Referring Rayvn Rickerson: Treating Aicha Clingenpeel/Extender: [name] in Treatment: 24 Compression Therapy Performed for Wound Assessment: Wound #7 Left,Distal,Lateral Lower Leg Performed By: Clinician [name], RN Compression Type: Three Layer Post Procedure Diagnosis Same as Pre-procedure Electronic Signature(s) Signed: 06/06/2020 5:01:22 PM By: Kela Millin Entered By: Kela Millin on 06/06/2020 08:31:39 -------------------------------------------------------------------------------- Encounter Discharge Information Details Patient Name: Date of Service: [name] 06/06/2020 8:00 A M Medical Record Number: [id number] Patient Account Number: [id number] Date of Birth/Sex: Treating RN: [birth year] (86 y.o. [name] Primary Care Laquia Rosano: Patrina Levering Other Clinician: Referring Kaiyon Hynes: Treating Korena Nass/Extender: Evelena Peat in Treatment: 24 Encounter Discharge Information Items Post Procedure Vitals Discharge Condition: Stable Temperature (F): 97.7 Ambulatory Status: Ambulatory Pulse (bpm): 42 Discharge Destination: Home Respiratory Rate (breaths/min): 16 Transportation: Private Auto Blood  Pressure (mmHg): 147/75 Accompanied By: husband Schedule Follow-up Appointment: Yes Clinical Summary of Care: Patient Declined Electronic Signature(s) Signed: 06/06/2020 5:45:26 PM By: [name] RN, BSN Entered By: [name] on 06/06/2020 09:02:46 -------------------------------------------------------------------------------- Lower Extremity Assessment Details Patient Name: Date of Service: [name] 06/06/2020 8:00 A M Medical Record Number: [id number] Patient Account Number: [id number] Date of Birth/Sex: Treating RN: [birth year] (86 y.o. [name] Primary Care Zyier Dykema: Patrina Levering Other Clinician: Referring Meika Earll: Treating Shavelle Runkel/Extender: Venita Lick T Weeks in Treatment: 24 Edema Assessment Assessed: [Left: Yes] [Right: No] Edema: [Left: N] [Right: o]  Calf Left: Right: Point of Measurement: 42 cm From Medial Instep 30.5 cm cm Ankle Left: Right: Point of Measurement: 13 cm From Medial Instep 21 cm cm Vascular Assessment Pulses: Dorsalis Pedis Palpable: [Left:Yes] Electronic Signature(s) Signed: 06/06/2020 5:05:03 PM By: Deon Pilling Entered By: Deon Pilling on 06/06/2020 08:13:06 -------------------------------------------------------------------------------- Multi Wound Chart Details Patient Name: Date of Service: [name] 06/06/2020 8:00 A M Medical Record Number: [id number] Patient Account Number: [id number] Date of Birth/Sex: Treating RN: Feb 03, 1933 (86 y.o. [name] Primary Care Liyah Higham: Lajean Manes T Other Clinician: Referring Melika Reder: Treating Ida Uppal/Extender: [name] in Treatment: 24 Vital Signs Height(in): 69 Pulse(bpm): 42 Weight(lbs): 163 Blood Pressure(mmHg): 147/75 Body Mass Index(BMI): 24 Temperature(F): 97.7 Respiratory Rate(breaths/min): 16 Photos: [5:No Photos Left, Lateral Lower Leg] [7:No Photos Left, Distal, Lateral Lower  Leg] [N/A:N/A N/A] Wound Location: [5:Gradually Appeared] [7:Gradually Appeared] [N/A:N/A] Wounding Event: [5:Venous Leg Ulcer] [7:Venous Leg Ulcer] [N/A:N/A] Primary Etiology: [5:Cataracts, Anemia, Arrhythmia,] [7:Cataracts, Anemia, Arrhythmia,] [N/A:N/A] Comorbid History: [5:Congestive Heart Failure, Hypertension, Peripheral Venous Disease, Received Radiation 03/21/2020] [7:Congestive Heart Failure, Hypertension, Peripheral Venous Disease, Received Radiation 05/30/2020] [N/A:N/A] Date Acquired: [5:11] [7:1] [N/A:N/A] Weeks of Treatment: [5:Open] [7:Open] [N/A:N/A] Wound Status: [5:3x2.5x0.2] [7:0.5x0.8x0.1] [N/A:N/A] Measurements L x W x D (cm) [5:5.89] [2:3.557] [N/A:N/A] A (cm) : rea [5:1.178] [7:0.031] [N/A:N/A] Volume (cm) : [5:-1071.00%] [7:-100.00%] [N/A:N/A] % Reduction in Area: [5:-2256.00%] [7:-93.70%] [N/A:N/A] % Reduction in Volume: [5:Full Thickness Without Exposed] [7:Full Thickness Without Exposed] [N/A:N/A] Classification: [5:Support Structures Medium] [7:Support Structures Medium] [N/A:N/A] Exudate Amount: [5:Serosanguineous] [7:Serosanguineous] [N/A:N/A] Exudate Type: [5:red, brown] [7:red, brown] [N/A:N/A] Exudate Color: [5:Flat and Intact] [7:Flat and Intact] [N/A:N/A] Wound Margin: [5:Medium (34-66%)] [7:None Present (0%)] [N/A:N/A] Granulation Amount: [5:Red] [7:N/A] [N/A:N/A] Granulation Quality: [5:Medium (34-66%)] [7:Large (67-100%)] [N/A:N/A] Necrotic Amount: [5:Fat Layer (Subcutaneous Tissue)] [7:Fat Layer (Subcutaneous Tissue)] [N/A:N/A] Exposed Structures: [5:Exposed: Yes Fascia: No Tendon: No Muscle: No Joint: No Bone: No Small (1-33%)] [7:Exposed: Yes Fascia: No Tendon: No Muscle: No Joint: No Bone: No None] [N/A:N/A] Epithelialization: [5:Debridement - Excisional] [7:N/A] [N/A:N/A] Debridement: Pre-procedure Verification/Time Out 08:28 [7:N/A] [N/A:N/A] Taken: [5:Other] [7:N/A] [N/A:N/A] Pain Control: [5:Subcutaneous] [7:N/A] [N/A:N/A] Tissue Debrided:  [5:Skin/Subcutaneous Tissue] [7:N/A] [N/A:N/A] Level: [5:7.5] [7:N/A] [N/A:N/A] Debridement A (sq cm): [5:rea Curette] [7:N/A] [N/A:N/A] Instrument: [5:Minimum] [7:N/A] [N/A:N/A] Bleeding: [5:Pressure] [7:N/A] [N/A:N/A] Hemostasis A chieved: [5:1] [7:N/A] [N/A:N/A] Procedural Pain: [5:0] [7:N/A] [N/A:N/A] Post Procedural Pain: [5:Procedure was tolerated well] [7:N/A] [N/A:N/A] Debridement Treatment Response: [5:3x2.5x0.2] [7:N/A] [N/A:N/A] Post Debridement Measurements L x W x D (cm) [5:1.178] [7:N/A] [N/A:N/A] Post Debridement Volume: (cm) [5:Compression Therapy] [7:Compression Therapy] [N/A:N/A] Procedures Performed: [5:Debridement] Treatment Notes Electronic Signature(s) Signed: 06/06/2020 5:01:22 PM By: Kela Millin Signed: 06/06/2020 5:27:17 PM By: Linton Ham MD Entered By: Linton Ham on 06/06/2020 08:34:46 -------------------------------------------------------------------------------- Multi-Disciplinary Care Plan Details Patient Name: Date of Service: [place], [place]. 06/06/2020 8:00 A M Medical Record Number: [id number] Patient Account Number: [id number] Date of Birth/Sex: Treating RN: 05-31-1933 (86 y.o. [name] Primary Care Zlatan Hornback: Patrina Levering Other Clinician: Referring Ladale Sherburn: Treating Jaleesa Cervi/Extender: Evelena Peat in Treatment: 24 Active Inactive Abuse / Safety / Falls / Self Care Management Nursing Diagnoses: Potential for falls Goals: Patient/caregiver will verbalize understanding of skin care regimen Date Initiated: 12/20/2019 Date Inactivated: 02/14/2020 Target Resolution Date: 02/22/2020 Goal Status: Met Patient/caregiver will verbalize/demonstrate understanding of what to do in case of emergency Date Initiated: 12/20/2019 Target Resolution Date: 06/13/2020 Goal Status: Active Interventions: Assess fall risk on admission and as needed Provide education on fall prevention  Notes: Pain, Acute or  Chronic Nursing Diagnoses: Pain, acute or chronic: actual or potential Potential alteration in comfort, pain Goals: Patient will verbalize adequate pain control and receive pain control interventions during procedures as needed Date Initiated: 12/20/2019 Date Inactivated: 02/14/2020 Target Resolution Date: 02/22/2020 Goal Status: Met Patient/caregiver will verbalize comfort level met Date Initiated: 12/20/2019 Target Resolution Date: 06/13/2020 Goal Status: Active Interventions: Complete pain assessment as per visit requirements Provide education on pain management Notes: Wound/Skin Impairment Nursing Diagnoses: Knowledge deficit related to ulceration/compromised skin integrity Goals: Patient/caregiver will verbalize understanding of skin care regimen Date Initiated: 12/20/2019 Target Resolution Date: 06/13/2020 Goal Status: Active Interventions: Assess patient/caregiver ability to perform ulcer/skin care regimen upon admission and as needed Provide education on ulcer and skin care Treatment Activities: Skin care regimen initiated : 12/20/2019 Topical wound management initiated : 12/20/2019 Notes: Electronic Signature(s) Signed: 06/06/2020 5:01:22 PM By: Kela Millin Entered By: Kela Millin on 06/06/2020 08:23:59 -------------------------------------------------------------------------------- Pain Assessment Details Patient Name: Date of Service: [name] 06/06/2020 8:00 A M Medical Record Number: [id number] Patient Account Number: [id number] Date of Birth/Sex: Treating RN: 01-05-1933 (86 y.o. [name] Primary Care Samuel Rittenhouse: Patrina Levering Other Clinician: Referring Jhana Giarratano: Treating Lorraine Terriquez/Extender: Evelena Peat in Treatment: 24 Active Problems Location of Pain Severity and Description of Pain Patient Has Paino Yes Site Locations Pain Location: Pain in Ulcers Rate the pain. Current Pain Level: 7 Worst Pain Level:  10 Least Pain Level: 0 Tolerable Pain Level: 8 Character of Pain Describe the Pain: Aching, Heavy, Sharp Pain Management and Medication Current Pain Management: Medication: No Cold Application: No Rest: No Massage: No Activity: No T.E.N.S.: No Heat Application: No Leg drop or elevation: No Is the Current Pain Management Adequate: Adequate How does your wound impact your activities of daily livingo Sleep: No Bathing: No Appetite: No Relationship With Others: No Bladder Continence: No Emotions: No Bowel Continence: No Work: No Toileting: No Drive: No Dressing: No Hobbies: No Electronic Signature(s) Signed: 06/06/2020 5:05:03 PM By: Deon Pilling Entered By: Deon Pilling on 06/06/2020 08:12:54 -------------------------------------------------------------------------------- Patient/Caregiver Education Details Patient Name: Date of Service: [place], [name] 6/25/2021andnbsp8:00 A M Medical Record Number: [id number] Patient Account Number: [id number] Date of Birth/Gender: Treating RN: Jul 03, 1933 (86 y.o. [name] Primary Care Physician: Lajean Manes T Other Clinician: Referring Physician: Treating Physician/Extender: Evelena Peat in Treatment: 24 Education Assessment Education Provided To: Patient Education Topics Provided Pain: Handouts: A Guide to Pain Control Methods: Explain/Verbal Responses: State content correctly Safety: Handouts: Personal Safety Methods: Explain/Verbal Responses: State content correctly Wound/Skin Impairment: Handouts: Caring for Your Ulcer Methods: Explain/Verbal Responses: State content correctly Electronic Signature(s) Signed: 06/06/2020 5:01:22 PM By: Kela Millin Entered By: Kela Millin on 06/06/2020 08:24:32 -------------------------------------------------------------------------------- Wound Assessment Details Patient Name: Date of Service: [name] 06/06/2020 8:00  A M Medical Record Number: [id number] Patient Account Number: [id number] Date of Birth/Sex: Treating RN: [birth year] (86 y.o. [name] Primary Care Davy Faught: Lajean Manes T Other Clinician: Referring Maddisyn Hegwood: Treating Yalissa Fink/Extender: [name] in Treatment: 24 Wound Status Wound Number: 5 Primary Venous Leg Ulcer Etiology: Wound Location: Left, Lateral Lower Leg Wound Open Wounding Event: Gradually Appeared Status: Date Acquired: 03/21/2020 Comorbid Cataracts, Anemia, Arrhythmia, Congestive Heart Failure, Weeks Of Treatment: 11 History: Hypertension, Peripheral Venous Disease, Received Radiation Clustered Wound: No Wound Measurements Length: (cm) 3 Width: (cm) 2.5 Depth: (cm) 0.2 Area: (cm) 5.89 Volume: (cm) 1.178 % Reduction in Area: -  1071% % Reduction in Volume: -2256% Epithelialization: Small (1-33%) Tunneling: No Undermining: No Wound Description Classification: Full Thickness Without Exposed Support Structures Wound Margin: Flat and Intact Exudate Amount: Medium Exudate Type: Serosanguineous Exudate Color: red, brown Foul Odor After Cleansing: No Slough/Fibrino Yes Wound Bed Granulation Amount: Medium (34-66%) Exposed Structure Granulation Quality: Red Fascia Exposed: No Necrotic Amount: Medium (34-66%) Fat Layer (Subcutaneous Tissue) Exposed: Yes Necrotic Quality: Adherent Slough Tendon Exposed: No Muscle Exposed: No Joint Exposed: No Bone Exposed: No Treatment Notes Wound #5 (Left, Lateral Lower Leg) 1. Cleanse With Soap and water 2. Periwound Care Moisturizing lotion TCA Cream 3. Primary Dressing Applied Hydrofera Blue 4. Secondary Dressing ABD Pad Dry Gauze 6. Support Layer Applied 3 layer compression wrap Electronic Signature(s) Signed: 06/06/2020 5:05:03 PM By: Deon Pilling Entered By: Deon Pilling on 06/06/2020  08:13:28 -------------------------------------------------------------------------------- Wound Assessment Details Patient Name: Date of Service: [name] 06/06/2020 8:00 A M Medical Record Number: [id number] Patient Account Number: [id number] Date of Birth/Sex: Treating RN: 03/15/1933 (86 y.o. [name] Primary Care Franceen Erisman: Lajean Manes T Other Clinician: Referring Rayan Ines: Treating Halle Davlin/Extender: [name] in Treatment: 24 Wound Status Wound Number: 7 Primary Venous Leg Ulcer Etiology: Wound Location: Left, Distal, Lateral Lower Leg Wound Open Wounding Event: Gradually Appeared Status: Date Acquired: 05/30/2020 Comorbid Cataracts, Anemia, Arrhythmia, Congestive Heart Failure, Weeks Of Treatment: 1 History: Hypertension, Peripheral Venous Disease, Received Radiation Clustered Wound: No Wound Measurements Length: (cm) 0.5 Width: (cm) 0.8 Depth: (cm) 0.1 Area: (cm) 0.314 Volume: (cm) 0.031 % Reduction in Area: -100% % Reduction in Volume: -93.7% Epithelialization: None Tunneling: No Undermining: No Wound Description Classification: Full Thickness Without Exposed Support Structures Wound Margin: Flat and Intact Exudate Amount: Medium Exudate Type: Serosanguineous Exudate Color: red, brown Foul Odor After Cleansing: No Slough/Fibrino Yes Wound Bed Granulation Amount: None Present (0%) Exposed Structure Necrotic Amount: Large (67-100%) Fascia Exposed: No Necrotic Quality: Adherent Slough Fat Layer (Subcutaneous Tissue) Exposed: Yes Tendon Exposed: No Muscle Exposed: No Joint Exposed: No Bone Exposed: No Treatment Notes Wound #7 (Left, Distal, Lateral Lower Leg) 1. Cleanse With Soap and water 2. Periwound Care Moisturizing lotion TCA Cream 3. Primary Dressing Applied Hydrofera Blue 4. Secondary Dressing ABD Pad Dry Gauze 6. Support Layer Applied 3 layer compression wrap Electronic  Signature(s) Signed: 06/06/2020 5:05:03 PM By: Deon Pilling Entered By: Deon Pilling on 06/06/2020 08:14:11 -------------------------------------------------------------------------------- Vitals Details Patient Name: Date of Service: [name], [place]. 06/06/2020 8:00 A M Medical Record Number: [id number] Patient Account Number: [id number] Date of Birth/Sex: Treating RN: [birth year] (86 y.o. [name] Primary Care Kandise Riehle: Lajean Manes T Other Clinician: Referring Lejend Dalby: Treating Shalea Tomczak/Extender: [name] in Treatment: 24 Vital Signs Time Taken: 08:00 Temperature (F): 97.7 Height (in): 69 Pulse (bpm): 42 Weight (lbs): 163 Respiratory Rate (breaths/min): 16 Body Mass Index (BMI): 24.1 Blood Pressure (mmHg): 147/75 Reference Range: 80 - 120 mg / dl Electronic Signature(s) Signed: 06/06/2020 5:05:03 PM By: Deon Pilling Entered By: Deon Pilling on 06/06/2020 38:10:17

## 2020-06-09 ENCOUNTER — Other Ambulatory Visit: Payer: Self-pay | Admitting: *Deleted

## 2020-06-09 DIAGNOSIS — I872 Venous insufficiency (chronic) (peripheral): Secondary | ICD-10-CM

## 2020-06-10 ENCOUNTER — Other Ambulatory Visit: Payer: Self-pay

## 2020-06-10 ENCOUNTER — Ambulatory Visit (INDEPENDENT_AMBULATORY_CARE_PROVIDER_SITE_OTHER): Payer: Medicare Other | Admitting: Emergency Medicine

## 2020-06-10 DIAGNOSIS — Z950 Presence of cardiac pacemaker: Secondary | ICD-10-CM | POA: Diagnosis not present

## 2020-06-10 DIAGNOSIS — I482 Chronic atrial fibrillation, unspecified: Secondary | ICD-10-CM

## 2020-06-10 LAB — CUP PACEART INCLINIC DEVICE CHECK
Battery Remaining Longevity: 102 mo
Battery Voltage: 2.99 V
Brady Statistic AP VP Percent: 0 %
Brady Statistic AP VS Percent: 0 %
Brady Statistic AS VP Percent: 86.9 %
Brady Statistic AS VS Percent: 13.1 %
Brady Statistic RA Percent Paced: 0 %
Brady Statistic RV Percent Paced: 86.9 %
Date Time Interrogation Session: 20210629103637
Implantable Lead Implant Date: 20180905
Implantable Lead Implant Date: 20180905
Implantable Lead Location: 753859
Implantable Lead Location: 753860
Implantable Lead Model: 3830
Implantable Lead Model: 5076
Implantable Pulse Generator Implant Date: 20180905
Lead Channel Impedance Value: 266 Ohm
Lead Channel Impedance Value: 342 Ohm
Lead Channel Impedance Value: 380 Ohm
Lead Channel Impedance Value: 551 Ohm
Lead Channel Pacing Threshold Amplitude: 0.5 V
Lead Channel Pacing Threshold Pulse Width: 0.4 ms
Lead Channel Setting Pacing Amplitude: 2.5 V
Lead Channel Setting Pacing Pulse Width: 0.4 ms
Lead Channel Setting Sensing Sensitivity: 0.9 mV

## 2020-06-10 NOTE — Progress Notes (Signed)
Pacemaker check in clinic. Normal device function. Thresholds, sensing, impedances consistent with previous measurements. Device programmed to maximize longevity. Permanent AF, on Eliquis. No high ventricular rates noted. PVCs noted, undersensed at times, RV sensitivity increased to 0.79mV with no further undersensing noted. No crosstalk/noise noted with isometrics. Change reviewed with Dr. Lovena Le. Patient denies symptoms with PVCs. Device programmed at appropriate safety margins. Histogram distribution appropriate for patient activity level. Estimated longevity 8.5 years. Patient enrolled in remote follow-up. Patient education completed. [facility] on 08/26/20 and ROV with Dr. Lovena Le in 12/2020.  Patient aware to call if she needs assistance with monitor setup once new monitor is received.

## 2020-06-10 NOTE — Patient Instructions (Signed)
Your pacemaker is functioning normally.  You do have premature (early) heart beats at times, which is likely what caused your heart rate/pulse reading to be low.  Pulse oximeters, blood pressure cuffs, and even manual pulse checks can miss these premature beats when calculating your heart rate.  This is not worrisome and Dr. Lovena Le does not want to make any changes unless you start to feel the early heart beats.

## 2020-06-13 ENCOUNTER — Encounter (HOSPITAL_BASED_OUTPATIENT_CLINIC_OR_DEPARTMENT_OTHER): Payer: Medicare Other | Attending: Internal Medicine | Admitting: Internal Medicine

## 2020-06-13 DIAGNOSIS — Z923 Personal history of irradiation: Secondary | ICD-10-CM | POA: Diagnosis not present

## 2020-06-13 DIAGNOSIS — L97522 Non-pressure chronic ulcer of other part of left foot with fat layer exposed: Secondary | ICD-10-CM | POA: Diagnosis not present

## 2020-06-13 DIAGNOSIS — I87323 Chronic venous hypertension (idiopathic) with inflammation of bilateral lower extremity: Secondary | ICD-10-CM | POA: Insufficient documentation

## 2020-06-13 DIAGNOSIS — L97828 Non-pressure chronic ulcer of other part of left lower leg with other specified severity: Secondary | ICD-10-CM | POA: Insufficient documentation

## 2020-06-13 DIAGNOSIS — I482 Chronic atrial fibrillation, unspecified: Secondary | ICD-10-CM | POA: Insufficient documentation

## 2020-06-13 DIAGNOSIS — Z7901 Long term (current) use of anticoagulants: Secondary | ICD-10-CM | POA: Diagnosis not present

## 2020-06-13 DIAGNOSIS — Z950 Presence of cardiac pacemaker: Secondary | ICD-10-CM | POA: Insufficient documentation

## 2020-06-13 NOTE — Progress Notes (Signed)
[name], [name] ([id number]) Visit Report for 06/13/2020 HPI Details Patient Name: Date of Service: [place], [name] 06/13/2020 8:00 A M Medical Record Number: [id number] Patient Account Number: [id number] Date of Birth/Sex: Treating RN: 09-16-1933 (86 y.o. [name] Primary Care Provider: Patrina Levering Other Clinician: Referring Provider: Treating Provider/Extender: Evelena Peat in Treatment: 25 History of Present Illness HPI Description: ADMISSION 12/20/2019 This is an 86 year old woman referred by her primary physician Dr. Felipa Eth for review of a wound on her right lateral lower leg. She was actually in this clinic on 2 separate occasions in 2010 and 2012 cared for by Dr. Sherilyn Cooter. At that point in time she had wounds on her right leg as well. She tells [place] to 1 month ago she noticed a scab building up on her right lateral lower leg this opened into a wound. There was no overt cause of this no trauma, no infection that she is aware of. She has a history of chronic venous insufficiency and wears compression stockings fairly religiously indeed she is done well over the last 8 years since she was last in this clinic. She has been applying Vaseline on this and a covering phone. This is not progressing towards healing. Past medical history; interstitial lung disease, chronic atrial fibrillation status post pacemaker, osteoarthritis of the left knee, left breast CA, chronic repeat venous insufficiency. She takes Eliquis for her atrial fibrillation stroke prophylaxis. ABI in our clinic was 0.99 on the right 1/15; superficial wound on the right lateral calf in the setting of severe acute skin changes from chronic venous insufficiency and lymphedema. We used Iodoflex last week she complained of a lot of pain 1/22; this is a small but difficult wound on the right lateral calf in the setting of severe chronic venous insufficiency and secondary lymphedema.  She continues to state the wounds things hurts when she is up on it but seems to be relieved by putting her leg up. I changed her to [facility] last week because the Iodoflex seem to be causing stinging. 1/29. This wound appears to be contracting somewhat. Changes of chronic venous insufficiency with secondary lymphedema 2/12; not as good as surface today and slightly bigger. I had to change her from Iodoflex to [facility] because of the stinging pain although she does not think it was any better on the [facility]. She comes into clinic today with a area on the medial left great toe. She said she noticed blood on her sock last Saturday. She had some form of bunion surgery by Dr. Ila Mcgill her podiatrist sometime in 2019 she said he "shave the area". I could not find his operative report although I did see reference to the bunion in the area. 2/19; somewhat improved wound on the right lateral lower leg however the wound she came in on the bunion of her left great toe is actually I think larger still somewhat inflamed. We have been using Iodoflex 2/26; not much improvement in either wound area which is the original venous wound on the right lateral lower leg and in the area on the bunion of her left great toe. This still looks somewhat inflamed and tender. We have been using Iodoflex with open much improvement. 3/4; in general both her wounds look better this includes the original venous insufficiency wound on the right lateral lower leg and the area on her tip of her bunion on the left great toe medial aspect of the MTP. Change  to [facility] last week 3/12; the patient still has a small geographic shaped wound on the right lateral lower leg. We have been using Hydrofera Blue but I changed her to endoform today. She also has the area in the tip of the bunion of the left great toe. We will try Hydrofera Blue here as well 3/19; the small geographic wound on the right lateral lower leg  has not filled in. There is some surrounding induration we have noticed this previously. This may be venous inflammation but I wonder about biopsying this if this is not closing up. The area over the left medial first MTP [bunion deformity] requires debridement. This is not closing in. I am not convinced she is offloading adequately 3/26; right lateral lower leg in the setting of severe venous insufficiency and also an area over the first MTP bunion deformity. No debridement is required. We have been using endoform 4/9; right lateral lower leg wound in the setting of severe venous insufficiency and also a refractory area over the first MTP bunion deformity. The area on the right lateral lower leg is just about closed there is still a minor open area here. She comes in today with a mirror image area on the left lateral lower leg. She says this started as a scab 2 weeks ago and is gradually morphed into an open wound very similar to what she has on the right leg. She has severe bilateral venous insufficiency with venous hypertension obvious from clinical exam. 4/16; her original wound the right lateral leg wound in the setting of chronic venous hypertension is a small open area but very small. We have been using endoform. Her wound over the left first MTP bunion deformity also appears to be small and closing in. Unfortunately she has a large relative area on the left lateral calf which was new last week. Very adherent debris over this we have been using Iodoflex however that may be contributing to the debris on the wound surface 4/23; the original wound is closed and the area on the left first metatarsal bunion deformity is also almost closed the new area from last week required debridement. She went for her reflux studies that did not take her lower extremity wraps off. This is not helpful. She did have significant reflux in the right common femoral vein. I am not going to press this issue further.  She clinically has severe venous hypertension 4/29; the original wound is closed on the right lateral lower calf. The area on the left first metatarsal/bunion deformity has a small slitlike opening that is still not closed. The real problem here is now wound on the left lateral calf which is necrotic and deep. We used Iodoflex on this wound last time. Silver alginate on the left first toe The patient has Farrow wraps. We should be able to transition the right leg into compression stockings and were doing this today. 5/7; the original wound remains closed on the right lateral calf. The left first metatarsal head still is open. Deterioration in the wound which is the new wound from several weeks ago on the left lateral calf. The patient is not aware how this could have happened. We have been using Sorbact starting last week under 3 layer compression 5/14; the right lateral calf is closed the area on the first met head on the left is closed However the new area from 3 weeks ago on the left lateral calf continues to expand. There is raised nodular tissue around  the wound necrotic debris on the surface. Circumference of the wound also looks necrotic. It looks as though there is involvement of the tissue under the skin around the large area of this wound which looks threatened. She is complaining of pain. Culture of this wound I did last week showed rare coag negative staph variant I have never heard of although I am doubtful this has clinical significance 05/09/20-Patient returns with a left lateral calf wound looking about the same, the biopsies reviewed show no atypical features and consistent with trauma or pressure injury or stasis change, patient has appointment to be scheduled with vascular We are using silver alginate and 3 layer compression Patient was started on Trental by dermatology which she has been taking for a week now 6/4; left lateral calf wound. I reviewed the dermatology notes with Dr.  Martin Majestic from [facility] dermatology. 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 Put her on pentoxifylline noteworthy that she is on Eliquis as well. We have been using silver alginate under compression. She has an appointment for repeat vascular studies on 7/6. This gets back to the fact that they did not take the wrap off on the original studies that were done. I do not believe she has an arterial issue. 6/10; left lateral calf wound. I put her on Hydrofera Blue last week. Some improvement in the surface condition of the wound. She has repeat vascular studies/reflux studies on 7/6. We are trying to get Apligraf through [place] [employer]. Her husband states he looked on the website this is not going to be approved. I am really not sure if this is a "class effect" 6/18; somewhat surprisingly Apligraf was approved and 100% covered. Her husband was also quite shocked by this. Nevertheless we have her repeat vascular studies on 7/6 and I will not be able to apply this until this is done. We are using Hydrofera Blue to the wound on the left. Her original wound on the right lower leg has maintain closure using compression stocking 6/25; repeat vascular studies/venous on 7/6; we will apply the Apligraf I think that week. We are also going to work around a week's vacation they have the first week in August. 7/2; reflux studies on 7/6;. Likely place first Apligraf from the major wound area on 7/9; Electronic Signature(s) Signed: 06/13/2020 5:03:02 PM By: Linton Ham MD Entered By: Linton Ham on 06/13/2020 08:49:04 -------------------------------------------------------------------------------- Physical Exam Details Patient Name: Date of Service: [name] 06/13/2020 8:00 A M Medical Record Number: [id number] Patient Account Number: [id number] Date of Birth/Sex: Treating RN: [birth year] (86 y.o. [name] Primary Care Provider: Patrina Levering Other Clinician: Referring Provider: Treating Provider/Extender: [name] in Treatment: 25 Constitutional Patient is hypertensive.. Pulse regular and within target range for patient.Marland Kitchen Respirations regular, non-labored and within target range.. Temperature is normal and within the target range for the patient.Marland Kitchen Appears in no distress. Cardiovascular Pedal pulses are palpable. Edema is well controlled. Notes Wound exam; left lateral calf. The major wound has a better looking surface there is still fibrinous debris no debridement done today. She has almost a geographic wounds secondary to chronic venous and lymphedema. These have small areas I am not sure if they are full-thickness or not. Electronic Signature(s) Signed: 06/13/2020 5:03:02 PM By: Linton Ham MD Entered By: Linton Ham on 06/13/2020 08:51:35 -------------------------------------------------------------------------------- Physician Orders Details Patient Name: Date of Service: [name], [place]  TSY J. 06/13/2020 8:00 A M Medical Record Number: [id number] Patient Account Number: [id number] Date of Birth/Sex: Treating RN: 03/21/1933 (86 y.o. [name] Primary Care Provider: Lajean Manes T Other Clinician: Referring Provider: Treating Provider/Extender: Evelena Peat in Treatment: 25 Verbal / Phone Orders: No Diagnosis Coding ICD-10 Coding Code Description I87.321 Chronic venous hypertension (idiopathic) with inflammation of right lower extremity L97.828 Non-pressure chronic ulcer of other part of left lower leg with other specified severity I87.322 Chronic venous hypertension (idiopathic) with inflammation of left lower extremity L97.522 Non-pressure chronic ulcer of other part of left foot with fat layer exposed Follow-up Appointments ppointment in 1 week. - Friday Return A Dressing Change  Frequency Wound #5 Left,Lateral Lower Leg Do not change entire dressing for one week. Skin Barriers/Peri-Wound Care Moisturizing lotion - patient to lotion right leg every night. TCA Cream or Ointment - mixed with lotion to left leg Wound Cleansing May shower with protection. - cast protector Primary Wound Dressing Wound #5 Left,Lateral Lower Leg Hydrofera Blue Wound #7 Left,Distal,Lateral Lower Leg Hydrofera Blue Secondary Dressing Wound #5 Left,Lateral Lower Leg Dry Gauze ABD pad Wound #7 Left,Distal,Lateral Lower Leg Dry Gauze ABD pad Edema Control 3 Layer Compression System - Left Lower Extremity - use kerlix instead of cotton Avoid standing for long periods of time Elevate legs to the level of the heart or above for 30 minutes daily and/or when sitting, a frequency of: - throughout the day. Exercise regularly Support Garment 20-30 mm/Hg pressure to: - patient to apply farrow wrap 4000 to right leg. Apply in the morning and remove at night. Off-Loading Open toe surgical shoe to: - left Additional Orders / Instructions Other: - When you have your venous study on 06/17/2020 ensure to remove compression wrap before going into appointment to ensure you have the entire test completed. Call wound center for a nurse visit for rewrapping. Electronic Signature(s) Signed: 06/13/2020 4:13:30 PM By: Kela Millin Signed: 06/13/2020 5:03:02 PM By: Linton Ham MD Entered By: Kela Millin on 06/13/2020 07:59:33 -------------------------------------------------------------------------------- Problem List Details Patient Name: Date of Service: [name], [place]. 06/13/2020 8:00 A M Medical Record Number: [id number] Patient Account Number: [id number] Date of Birth/Sex: Treating RN: 1933-07-03 (86 y.o. [name] Primary Care Provider: Lajean Manes T Other Clinician: Referring Provider: Treating Provider/Extender: Evelena Peat in Treatment:  25 Active Problems ICD-10 Encounter Code Description Active Date MDM Diagnosis I87.321 Chronic venous hypertension (idiopathic) with inflammation of right lower 12/20/2019 No Yes extremity L97.828 Non-pressure chronic ulcer of other part of left lower leg with other specified 03/21/2020 No Yes severity I87.322 Chronic venous hypertension (idiopathic) with inflammation of left lower 02/14/2020 No Yes extremity L97.522 Non-pressure chronic ulcer of other part of left foot with fat layer exposed 01/25/2020 No Yes Inactive Problems ICD-10 Code Description Active Date Inactive Date L97.812 Non-pressure chronic ulcer of other part of right lower leg with fat layer exposed 12/20/2019 12/20/2019 Resolved Problems Electronic Signature(s) Signed: 06/13/2020 5:03:02 PM By: Linton Ham MD Entered By: Linton Ham on 06/13/2020 08:48:00 -------------------------------------------------------------------------------- Progress Note Details Patient Name: Date of Service: [name] 06/13/2020 8:00 A M Medical Record Number: [id number] Patient Account Number: [id number] Date of Birth/Sex: Treating RN: 20-Sep-1933 (86 y.o. [name] Primary Care Provider: Patrina Levering Other Clinician: Referring Provider: Treating Provider/Extender: Evelena Peat in Treatment: 25 Subjective History of Present Illness (HPI) ADMISSION 12/20/2019 This is an 86 year old woman referred by her primary  physician Dr. Felipa Eth for review of a wound on her right lateral lower leg. She was actually in this clinic on 2 separate occasions in 2010 and 2012 cared for by Dr. Sherilyn Cooter. At that point in time she had wounds on her right leg as well. She tells [place] to 1 month ago she noticed a scab building up on her right lateral lower leg this opened into a wound. There was no overt cause of this no trauma, no infection that she is aware of. She has a history of chronic venous insufficiency and  wears compression stockings fairly religiously indeed she is done well over the last 8 years since she was last in this clinic. She has been applying Vaseline on this and a covering phone. This is not progressing towards healing. Past medical history; interstitial lung disease, chronic atrial fibrillation status post pacemaker, osteoarthritis of the left knee, left breast CA, chronic repeat venous insufficiency. She takes Eliquis for her atrial fibrillation stroke prophylaxis. ABI in our clinic was 0.99 on the right 1/15; superficial wound on the right lateral calf in the setting of severe acute skin changes from chronic venous insufficiency and lymphedema. We used Iodoflex last week she complained of a lot of pain 1/22; this is a small but difficult wound on the right lateral calf in the setting of severe chronic venous insufficiency and secondary lymphedema. She continues to state the wounds things hurts when she is up on it but seems to be relieved by putting her leg up. I changed her to [facility] last week because the Iodoflex seem to be causing stinging. 1/29. This wound appears to be contracting somewhat. Changes of chronic venous insufficiency with secondary lymphedema 2/12; not as good as surface today and slightly bigger. I had to change her from Iodoflex to [facility] because of the stinging pain although she does not think it was any better on the [facility]. ooShe comes into clinic today with a area on the medial left great toe. She said she noticed blood on her sock last Saturday. She had some form of bunion surgery by Dr. Ila Mcgill her podiatrist sometime in 2019 she said he "shave the area". I could not find his operative report although I did see reference to the bunion in the area. 2/19; somewhat improved wound on the right lateral lower leg however the wound she came in on the bunion of her left great toe is actually I think larger still somewhat inflamed. We have  been using Iodoflex 2/26; not much improvement in either wound area which is the original venous wound on the right lateral lower leg and in the area on the bunion of her left great toe. This still looks somewhat inflamed and tender. We have been using Iodoflex with open much improvement. 3/4; in general both her wounds look better this includes the original venous insufficiency wound on the right lateral lower leg and the area on her tip of her bunion on the left great toe medial aspect of the MTP. Change to Hydrofera Blue last week 3/12; the patient still has a small geographic shaped wound on the right lateral lower leg. We have been using Hydrofera Blue but I changed her to endoform today. She also has the area in the tip of the bunion of the left great toe. We will try Hydrofera Blue here as well 3/19; the small geographic wound on the right lateral lower leg has not filled in. There is some surrounding induration we  have noticed this previously. This may be venous inflammation but I wonder about biopsying this if this is not closing up. The area over the left medial first MTP [bunion deformity] requires debridement. This is not closing in. I am not convinced she is offloading adequately 3/26; right lateral lower leg in the setting of severe venous insufficiency and also an area over the first MTP bunion deformity. No debridement is required. We have been using endoform 4/9; right lateral lower leg wound in the setting of severe venous insufficiency and also a refractory area over the first MTP bunion deformity. The area on the right lateral lower leg is just about closed there is still a minor open area here. She comes in today with a mirror image area on the left lateral lower leg. She says this started as a scab 2 weeks ago and is gradually morphed into an open wound very similar to what she has on the right leg. She has severe bilateral venous insufficiency with venous hypertension obvious  from clinical exam. 4/16; her original wound the right lateral leg wound in the setting of chronic venous hypertension is a small open area but very small. We have been using endoform. Her wound over the left first MTP bunion deformity also appears to be small and closing in. Unfortunately she has a large relative area on the left lateral calf which was new last week. Very adherent debris over this we have been using Iodoflex however that may be contributing to the debris on the wound surface 4/23; the original wound is closed and the area on the left first metatarsal bunion deformity is also almost closed the new area from last week required debridement. She went for her reflux studies that did not take her lower extremity wraps off. This is not helpful. She did have significant reflux in the right common femoral vein. I am not going to press this issue further. She clinically has severe venous hypertension 4/29; the original wound is closed on the right lateral lower calf. The area on the left first metatarsal/bunion deformity has a small slitlike opening that is still not closed. The real problem here is now wound on the left lateral calf which is necrotic and deep. We used Iodoflex on this wound last time. Silver alginate on the left first toe The patient has Farrow wraps. We should be able to transition the right leg into compression stockings and were doing this today. 5/7; the original wound remains closed on the right lateral calf. The left first metatarsal head still is open. Deterioration in the wound which is the new wound from several weeks ago on the left lateral calf. The patient is not aware how this could have happened. We have been using Sorbact starting last week under 3 layer compression 5/14; the right lateral calf is closed the area on the first met head on the left is closed However the new area from 3 weeks ago on the left lateral calf continues to expand. There is raised  nodular tissue around the wound necrotic debris on the surface. Circumference of the wound also looks necrotic. It looks as though there is involvement of the tissue under the skin around the large area of this wound which looks threatened. She is complaining of pain. Culture of this wound I did last week showed rare coag negative staph variant I have never heard of although I am doubtful this has clinical significance 05/09/20-Patient returns with a left lateral calf wound looking about  the same, the biopsies reviewed show no atypical features and consistent with trauma or pressure injury or stasis change, patient has appointment to be scheduled with vascular We are using silver alginate and 3 layer compression Patient was started on Trental by dermatology which she has been taking for a week now 6/4; left lateral calf wound. I reviewed the dermatology notes with Dr. Martin Majestic from [facility] dermatology. 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 Put her on pentoxifylline noteworthy that she is on Eliquis as well. We have been using silver alginate under compression. She has an appointment for repeat vascular studies on 7/6. This gets back to the fact that they did not take the wrap off on the original studies that were done. I do not believe she has an arterial issue. 6/10; left lateral calf wound. I put her on Hydrofera Blue last week. Some improvement in the surface condition of the wound. She has repeat vascular studies/reflux studies on 7/6. We are trying to get Apligraf through [place] [employer]. Her husband states he looked on the website this is not going to be approved. I am really not sure if this is a "class effect" 6/18; somewhat surprisingly Apligraf was approved and 100% covered. Her husband was also quite shocked by this. Nevertheless we have her repeat vascular studies on 7/6 and I will not  be able to apply this until this is done. We are using Hydrofera Blue to the wound on the left. Her original wound on the right lower leg has maintain closure using compression stocking 6/25; repeat vascular studies/venous on 7/6; we will apply the Apligraf I think that week. We are also going to work around a week's vacation they have the first week in August. 7/2; reflux studies on 7/6;. Likely place first Apligraf from the major wound area on 7/9; Objective Constitutional Patient is hypertensive.. Pulse regular and within target range for patient.Marland Kitchen Respirations regular, non-labored and within target range.. Temperature is normal and within the target range for the patient.Marland Kitchen Appears in no distress. Vitals Time Taken: 8:25 AM, Height: 69 in, Weight: 163 lbs, BMI: 24.1, Temperature: 98.4 F, Pulse: 85 bpm, Respiratory Rate: 18 breaths/min, Blood Pressure: 147/94 mmHg. Cardiovascular Pedal pulses are palpable. Edema is well controlled. General Notes: Wound exam; left lateral calf. The major wound has a better looking surface there is still fibrinous debris no debridement done today. She has almost a geographic wounds secondary to chronic venous and lymphedema. These have small areas I am not sure if they are full-thickness or not. Integumentary (Hair, Skin) Wound #5 status is Open. Original cause of wound was Gradually Appeared. The wound is located on the Left,Lateral Lower Leg. The wound measures 3cm length x 2.8cm width x 0.3cm depth; 6.597cm^2 area and 1.979cm^3 volume. There is Fat Layer (Subcutaneous Tissue) Exposed exposed. There is no tunneling or undermining noted. There is a medium amount of serosanguineous drainage noted. The wound margin is flat and intact. There is medium (34-66%) red granulation within the wound bed. There is a medium (34-66%) amount of necrotic tissue within the wound bed including Adherent Slough. Wound #7 status is Open. Original cause of wound was Gradually  Appeared. The wound is located on the Left,Distal,Lateral Lower Leg. The wound measures 1cm length x 1.1cm width x 0.1cm depth; 0.864cm^2 area and 0.086cm^3 volume. There is Fat Layer (Subcutaneous Tissue) Exposed exposed. There is no tunneling or undermining noted. There is a  medium amount of serosanguineous drainage noted. The wound margin is flat and intact. There is no granulation within the wound bed. There is a large (67-100%) amount of necrotic tissue within the wound bed including Adherent Slough. 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 There was a Three Layer Compression Therapy Procedure by [name], RN. Post procedure Diagnosis Wound #5: Same as Pre-Procedure Wound #7 Pre-procedure diagnosis of Wound #7 is a Venous Leg Ulcer located on the Left,Distal,Lateral Lower Leg . There was a Three Layer Compression Therapy Procedure by [name], RN. Post procedure Diagnosis Wound #7: Same as Pre-Procedure Plan Follow-up Appointments: Return Appointment in 1 week. - Friday Dressing Change Frequency: Wound #5 Left,Lateral Lower Leg: Do not change entire dressing for one week. Skin Barriers/Peri-Wound Care: Moisturizing lotion - patient to lotion right leg every night. TCA Cream or Ointment - mixed with lotion to left leg Wound Cleansing: May shower with protection. - cast protector Primary Wound Dressing: Wound #5 Left,Lateral Lower Leg: Hydrofera Blue Wound #7 Left,Distal,Lateral Lower Leg: Hydrofera Blue Secondary  Dressing: Wound #5 Left,Lateral Lower Leg: Dry Gauze ABD pad Wound #7 Left,Distal,Lateral Lower Leg: Dry Gauze ABD pad Edema Control: 3 Layer Compression System - Left Lower Extremity - use kerlix instead of cotton Avoid standing for long periods of time Elevate legs to the level of the heart or above for 30 minutes daily and/or when sitting, a frequency of: - throughout the day. Exercise regularly Support Garment 20-30 mm/Hg pressure to: - patient to apply farrow wrap 4000 to right leg. Apply in the morning and remove at night. Off-Loading: Open toe surgical shoe to: - left Additional Orders / Instructions: Other: - When you have your venous study on 06/17/2020 ensure to remove compression wrap before going into appointment to ensure you have the entire test completed. Call wound center for a nurse visit for rewrapping. #1. Continue with Hydrofera Blue #2. She will have her reflux studies on 7/6 come back for a nurse visit to change the visit #3. Debridement next week first Apligraf to the major wound area. I may try to get out some of the smaller linear areas as well as there is likely to be left over Apligraf Electronic Signature(s) Signed: 06/13/2020 5:03:02 PM By: Linton Ham MD Entered By: Linton Ham on 06/13/2020 08:52:40 -------------------------------------------------------------------------------- SuperBill Details Patient Name: Date of Service: [name] 06/13/2020 Medical Record Number: [id number] Patient Account Number: [id number] Date of Birth/Sex: Treating RN: 08-Sep-1933 (86 y.o. [name] Primary Care Provider: Lajean Manes T Other Clinician: Referring Provider: Treating Provider/Extender: Evelena Peat in Treatment: 25 Diagnosis Coding ICD-10 Codes Code Description [phone] Chronic venous hypertension (idiopathic) with inflammation of right lower extremity L97.828 Non-pressure chronic ulcer of other part of left  lower leg with other specified severity I87.322 Chronic venous hypertension (idiopathic) with inflammation of left lower extremity L97.522 Non-pressure chronic ulcer of other part of left foot with fat layer exposed Facility Procedures The patient participates with Medicare or their insurance follows the Medicare Facility Guidelines: CPT4 Code Description Modifier Quantity [id number] (Facility Use Only) [phone] - [place]  LWR LT LEG 1 Physician Procedures : CPT4 Code Description Modifier [id number] [id number] - WC PHYS LEVEL 3 - EST PT ICD-10 Diagnosis Description I87.321 Chronic venous hypertension (idiopathic) with inflammation of right lower extremity L97.828 Non-pressure chronic ulcer of other part of left  lower leg with other specified severity I87.322 Chronic venous hypertension (idiopathic) with inflammation of left lower extremity Quantity: 1 Electronic Signature(s) Signed: 06/13/2020 5:03:02 PM By: Linton Ham MD Entered By: Linton Ham on 06/13/2020 08:52:57

## 2020-06-13 NOTE — Progress Notes (Addendum)
BENICIA, BERGEVIN ([id number]) Visit Report for 06/13/2020 Arrival Information Details Patient Name: Date of Service: [place], [place] 06/13/2020 8:00 A M Medical Record Number: [id number] Patient Account Number: [id number] Date of Birth/Sex: Treating RN: 09/16/1933 (86 y.o. Freddy Finner Primary Care Armonie Staten: Merlene Laughter T Other Clinician: Referring Zanyah Lentsch: Treating Calina Patrie/Extender: Shella Spearing in Treatment: 25 Visit Information History Since Last Visit All ordered tests and consults were completed: No Patient Arrived: Ambulatory Added or deleted any medications: No Arrival Time: 08:25 Any new allergies or adverse reactions: No Accompanied By: husband Had a fall or experienced change in No Transfer Assistance: None activities of daily living that may affect Patient Identification Verified: Yes risk of falls: Secondary Verification Process Completed: Yes Signs or symptoms of abuse/neglect since last visito No Patient Requires Transmission-Based Precautions: No Hospitalized since last visit: No Patient Has Alerts: Yes Implantable device outside of the clinic excluding No Patient Alerts: Patient on Blood Thinner cellular tissue based products placed in the center Right ABI:0.99 since last visit: Has Dressing in Place as Prescribed: Yes Has Compression in Place as Prescribed: Yes Pain Present Now: No Electronic Signature(s) Signed: 06/13/2020 3:42:07 PM By: Yevonne Pax RN Entered By: Yevonne Pax on 06/13/2020 08:25:42 -------------------------------------------------------------------------------- Compression Therapy Details Patient Name: Date of Service: Mauri [place] J. 06/13/2020 8:00 A M Medical Record Number: [id number] Patient Account Number: [id number] Date of Birth/Sex: Treating RN: 29-Sep-1933 (86 y.o. Harvest Dark Primary Care Alasia Enge: Estrellita Ludwig Other Clinician: Referring Suellen Durocher: Treating Austine Wiedeman/Extender: Addison Naegeli Weeks in Treatment: 25 Compression Therapy Performed for Wound Assessment: Wound #5 Left,Lateral Lower Leg Performed By: Clinician Zenaida Deed, RN Compression Type: Three Layer Post Procedure Diagnosis Same as Pre-procedure Electronic Signature(s) Signed: 06/13/2020 4:13:30 PM By: Cherylin Mylar Entered By: Cherylin Mylar on 06/13/2020 08:40:29 -------------------------------------------------------------------------------- Compression Therapy Details Patient Name: Date of Service: Nelda [place]se[place]. 06/13/2020 8:00 A M Medical Record Number: [id number] Patient Account Number: [id number] Date of Birth/Sex: Treating RN: 12/03/1933 (86 y.o. Harvest Dark Primary Care Carleen Rhue: Estrellita Ludwig Other Clinician: Referring Jaicion Laurie: Treating Vala Raffo/Extender: Addison Naegeli Weeks in Treatment: 25 Compression Therapy Performed for Wound Assessment: Wound #7 Left,Distal,Lateral Lower Leg Performed By: Clinician Zenaida Deed, RN Compression Type: Three Layer Post Procedure Diagnosis Same as Pre-procedure Electronic Signature(s) Signed: 06/13/2020 4:13:30 PM By: Cherylin Mylar Entered By: Cherylin Mylar on 06/13/2020 08:40:29 -------------------------------------------------------------------------------- Encounter Discharge Information Details Patient Name: Date of Service: Nelda [place]se[place]. 06/13/2020 8:00 A M Medical Record Number: [id number] Patient Account Number: [id number] Date of Birth/Sex: Treating RN: 12/15/1932 (86 y.o. Wynelle Link Primary Care Aimee Heldman: Estrellita Ludwig Other Clinician: Referring Roxan Yamamoto: Treating Cooper Moroney/Extender: Shella Spearing in Treatment: 25 Encounter Discharge Information Items Discharge Condition: Stable Ambulatory Status: Ambulatory Discharge Destination: Home Transportation: Private Auto Accompanied By: spouse Schedule Follow-up Appointment:  Yes Clinical Summary of Care: Patient Declined Electronic Signature(s) Signed: 06/13/2020 4:42:40 PM By: Zandra Abts RN, BSN Entered By: Zandra Abts on 06/13/2020 09:05:28 -------------------------------------------------------------------------------- Lower Extremity Assessment Details Patient Name: Date of Service: Mauri [place] J. 06/13/2020 8:00 A M Medical Record Number: [id number] Patient Account Number: [id number] Date of Birth/Sex: Treating RN: 06-30-1933 (86 y.o. Freddy Finner Primary Care Travonna Swindle: Merlene Laughter T Other Clinician: Referring Kariah Loredo: Treating Cort Dragoo/Extender: Arlis Porta T Weeks in Treatment: 25 Edema Assessment Assessed: [Left: No] [Right: No] Edema: [Left: N] [Right: o] Calf Left: Right: Point of Measurement: 42 cm  From Medial Instep 31 cm cm Ankle Left: Right: Point of Measurement: 13 cm From Medial Instep 22 cm cm Electronic Signature(s) Signed: 06/13/2020 3:42:07 PM By: Carlene Coria RN Entered By: Carlene Coria on 06/13/2020 08:34:26 -------------------------------------------------------------------------------- Multi Wound Chart Details Patient Name: Date of Service: [name] 06/13/2020 8:00 A M Medical Record Number: [id number] Patient Account Number: [id number] Date of Birth/Sex: Treating RN: [birth year] (86 y.o. [name] Primary Care Antonisha Waskey: Lajean Manes T Other Clinician: Referring Aithan Farrelly: Treating Dalya Maselli/Extender: [name] in Treatment: 25 Vital Signs Height(in): 69 Pulse(bpm): 85 Weight(lbs): 163 Blood Pressure(mmHg): 147/94 Body Mass Index(BMI): 24 Temperature(F): 98.4 Respiratory Rate(breaths/min): 18 Photos: [5:No Photos Left, Lateral Lower Leg] [7:No Photos Left, Distal, Lateral Lower Leg] [N/A:N/A N/A] Wound Location: [5:Gradually Appeared] [7:Gradually Appeared] [N/A:N/A] Wounding Event: [5:Venous Leg Ulcer] [7:Venous Leg Ulcer]  [N/A:N/A] Primary Etiology: [5:Cataracts, Anemia, Arrhythmia,] [7:Cataracts, Anemia, Arrhythmia,] [N/A:N/A] Comorbid History: [5:Congestive Heart Failure, Hypertension, Peripheral Venous Disease, Received Radiation 03/21/2020] [7:Congestive Heart Failure, Hypertension, Peripheral Venous Disease, Received Radiation 05/30/2020] [N/A:N/A] Date Acquired: [5:12] [7:2] [N/A:N/A] Weeks of Treatment: [5:Open] [7:Open] [N/A:N/A] Wound Status: [5:3x2.8x0.3] [7:1x1.1x0.1] [N/A:N/A] Measurements L x W x D (cm) [5:6.597] [7:0.864] [N/A:N/A] A (cm) : rea [5:1.979] [7:0.086] [N/A:N/A] Volume (cm) : [5:-1211.50%] [7:-450.30%] [N/A:N/A] % Reduction in Area: [5:-3858.00%] [7:-437.50%] [N/A:N/A] % Reduction in Volume: [5:Full Thickness Without Exposed] [7:Full Thickness Without Exposed] [N/A:N/A] Classification: [5:Support Structures Medium] [7:Support Structures Medium] [N/A:N/A] Exudate Amount: [5:Serosanguineous] [7:Serosanguineous] [N/A:N/A] Exudate Type: [5:red, brown] [7:red, brown] [N/A:N/A] Exudate Color: [5:Flat and Intact] [7:Flat and Intact] [N/A:N/A] Wound Margin: [5:Medium (34-66%)] [7:None Present (0%)] [N/A:N/A] Granulation Amount: [5:Red] [7:N/A] [N/A:N/A] Granulation Quality: [5:Medium (34-66%)] [7:Large (67-100%)] [N/A:N/A] Necrotic Amount: [5:Fat Layer (Subcutaneous Tissue)] [7:Fat Layer (Subcutaneous Tissue)] [N/A:N/A] Exposed Structures: [5:Exposed: Yes Fascia: No Tendon: No Muscle: No Joint: No Bone: No Small (1-33%)] [7:Exposed: Yes Fascia: No Tendon: No Muscle: No Joint: No Bone: No None] [N/A:N/A] Epithelialization: [5:Compression Therapy] [7:Compression Therapy] [N/A:N/A] Procedures Performed: Treatment Notes Electronic Signature(s) Signed: 06/13/2020 4:13:30 PM By: Kela Millin Signed: 06/13/2020 5:03:02 PM By: Linton Ham MD Entered By: Linton Ham on 06/13/2020 08:48:16 -------------------------------------------------------------------------------- Multi-Disciplinary  Care Plan Details Patient Name: Date of Service: [place], [place]. 06/13/2020 8:00 A M Medical Record Number: [id number] Patient Account Number: [id number] Date of Birth/Sex: Treating RN: [birth year] (86 y.o. [name] Primary Care Nevyn Bossman: Patrina Levering Other Clinician: Referring Evolette Pendell: Treating Thatiana Renbarger/Extender: Evelena Peat in Treatment: 25 Active Inactive Abuse / Safety / Falls / Self Care Management Nursing Diagnoses: Potential for falls Goals: Patient/caregiver will verbalize understanding of skin care regimen Date Initiated: 12/20/2019 Date Inactivated: 02/14/2020 Target Resolution Date: 02/22/2020 Goal Status: Met Patient/caregiver will verbalize/demonstrate understanding of what to do in case of emergency Date Initiated: 12/20/2019 Target Resolution Date: 07/04/2020 Goal Status: Active Interventions: Assess fall risk on admission and as needed Provide education on fall prevention Notes: Pain, Acute or Chronic Nursing Diagnoses: Pain, acute or chronic: actual or potential Potential alteration in comfort, pain Goals: Patient will verbalize adequate pain control and receive pain control interventions during procedures as needed Date Initiated: 12/20/2019 Date Inactivated: 02/14/2020 Target Resolution Date: 02/22/2020 Goal Status: Met Patient/caregiver will verbalize comfort level met Date Initiated: 12/20/2019 Target Resolution Date: 07/04/2020 Goal Status: Active Interventions: Complete pain assessment as per visit requirements Provide education on pain management Notes: Wound/Skin Impairment Nursing Diagnoses: Knowledge deficit related to ulceration/compromised skin integrity Goals: Patient/caregiver will verbalize understanding of skin care regimen Date Initiated: 12/20/2019 Target Resolution Date:  07/04/2020 Goal Status: Active Interventions: Assess patient/caregiver ability to perform ulcer/skin care regimen upon admission and  as needed Provide education on ulcer and skin care Treatment Activities: Skin care regimen initiated : 12/20/2019 Topical wound management initiated : 12/20/2019 Notes: Electronic Signature(s) Signed: 06/13/2020 4:13:30 PM By: Kela Millin Entered By: Kela Millin on 06/13/2020 08:14:14 -------------------------------------------------------------------------------- Pain Assessment Details Patient Name: Date of Service: [name] 06/13/2020 8:00 A M Medical Record Number: [id number] Patient Account Number: [id number] Date of Birth/Sex: Treating RN: [birth year] (86 y.o. [name] Primary Care TRUE Garciamartinez: Lajean Manes T Other Clinician: Referring Yarielys Beed: Treating Dorleen Kissel/Extender: Evelena Peat in Treatment: 25 Active Problems Location of Pain Severity and Description of Pain Patient Has Paino No Site Locations Pain Management and Medication Current Pain Management: Electronic Signature(s) Signed: 06/13/2020 3:42:07 PM By: Carlene Coria RN Entered By: Carlene Coria on 06/13/2020 08:26:18 -------------------------------------------------------------------------------- Patient/Caregiver Education Details Patient Name: Date of Service: [place]as, [name] 7/2/2021andnbsp8:00 [place] Record Number: [id number] Patient Account Number: [id number] Date of Birth/Gender: Treating RN: 04-11-1933 (86 y.o. [name] Primary Care Physician: Lajean Manes T Other Clinician: Referring Physician: Treating Physician/Extender: Evelena Peat in Treatment: 25 Education Assessment Education Provided To: Patient Education Topics Provided Pain: Handouts: A Guide to Pain Control Methods: Explain/Verbal Responses: State content correctly Safety: Handouts: Medication Safety Methods: Explain/Verbal Responses: State content correctly Wound/Skin Impairment: Handouts: Caring for Your Ulcer Methods:  Explain/Verbal Responses: State content correctly Electronic Signature(s) Signed: 06/13/2020 4:13:30 PM By: Kela Millin Entered By: Kela Millin on 06/13/2020 08:14:35 -------------------------------------------------------------------------------- Wound Assessment Details Patient Name: Date of Service: [name] 06/13/2020 8:00 A M Medical Record Number: [id number] Patient Account Number: [id number] Date of Birth/Sex: Treating RN: 01-29-1933 (86 y.o. [name] Primary Care Aaryav Hopfensperger: Lajean Manes T Other Clinician: Referring Umaiza Matusik: Treating Searra Carnathan/Extender: [name] in Treatment: 25 Wound Status Wound Number: 5 Primary Venous Leg Ulcer Etiology: Wound Location: Left, Lateral Lower Leg Wound Open Wounding Event: Gradually Appeared Status: Date Acquired: 03/21/2020 Comorbid Cataracts, Anemia, Arrhythmia, Congestive Heart Failure, Weeks Of Treatment: 12 History: Hypertension, Peripheral Venous Disease, Received Radiation Clustered Wound: No Photos Wound Measurements Length: (cm) 3 Width: (cm) 2.8 Depth: (cm) 0.3 Area: (cm) 6.597 Volume: (cm) 1.979 % Reduction in Area: -1211.5% % Reduction in Volume: -3858% Epithelialization: Small (1-33%) Tunneling: No Undermining: No Wound Description Classification: Full Thickness Without Exposed Support Structures Wound Margin: Flat and Intact Exudate Amount: Medium Exudate Type: Serosanguineous Exudate Color: red, brown Foul Odor After Cleansing: No Slough/Fibrino Yes Wound Bed Granulation Amount: Medium (34-66%) Exposed Structure Granulation Quality: Red Fascia Exposed: No Necrotic Amount: Medium (34-66%) Fat Layer (Subcutaneous Tissue) Exposed: Yes Necrotic Quality: Adherent Slough Tendon Exposed: No Muscle Exposed: No Joint Exposed: No Bone Exposed: No Electronic Signature(s) Signed: 06/17/2020 4:45:55 PM By: Mikeal Hawthorne EMT/HBOT/SD Signed: 06/18/2020 5:22:20  PM By: Carlene Coria RN Previous Signature: 06/13/2020 3:42:07 PM Version By: Carlene Coria RN Entered By: Mikeal Hawthorne on 06/17/2020 13:36:49 -------------------------------------------------------------------------------- Wound Assessment Details Patient Name: Date of Service: [name] 06/13/2020 8:00 A M Medical Record Number: [id number] Patient Account Number: [id number] Date of Birth/Sex: Treating RN: [birth year] (86 y.o. [name] Primary Care Blanche Gallien: Lajean Manes T Other Clinician: Referring Stephenson Cichy: Treating Yuta Cipollone/Extender: [name] in Treatment: 25 Wound Status Wound Number: 7 Primary Venous Leg Ulcer Etiology: Wound Location: Left, Distal, Lateral Lower Leg Wound Open Wounding Event: Gradually Appeared Status: Date  Acquired: 05/30/2020 Comorbid Cataracts, Anemia, Arrhythmia, Congestive Heart Failure, Weeks Of Treatment: 2 History: Hypertension, Peripheral Venous Disease, Received Radiation Clustered Wound: No Photos Wound Measurements Length: (cm) 1 Width: (cm) 1.1 Depth: (cm) 0.1 Area: (cm) 0.864 Volume: (cm) 0.086 % Reduction in Area: -450.3% % Reduction in Volume: -437.5% Epithelialization: None Tunneling: No Undermining: No Wound Description Classification: Full Thickness Without Exposed Support Structures Wound Margin: Flat and Intact Exudate Amount: Medium Exudate Type: Serosanguineous Exudate Color: red, brown Foul Odor After Cleansing: No Slough/Fibrino Yes Wound Bed Granulation Amount: None Present (0%) Exposed Structure Necrotic Amount: Large (67-100%) Fascia Exposed: No Necrotic Quality: Adherent Slough Fat Layer (Subcutaneous Tissue) Exposed: Yes Tendon Exposed: No Muscle Exposed: No Joint Exposed: No Bone Exposed: No Electronic Signature(s) Signed: 06/17/2020 4:45:55 PM By: Mikeal Hawthorne EMT/HBOT/SD Signed: 06/18/2020 5:22:20 PM By: Carlene Coria RN Previous Signature: 06/13/2020 3:42:07 PM  Version By: Carlene Coria RN Entered By: Mikeal Hawthorne on 06/17/2020 13:36:25 -------------------------------------------------------------------------------- Vitals Details Patient Name: Date of Service: [name], [place] J. 06/13/2020 8:00 A M Medical Record Number: [id number] Patient Account Number: [id number] Date of Birth/Sex: Treating RN: 10-14-1933 (86 y.o. [name] Primary Care Cedrik Heindl: Lajean Manes T Other Clinician: Referring Latondra Gebhart: Treating Ruhi Kopke/Extender: Evelena Peat in Treatment: 25 Vital Signs Time Taken: 08:25 Temperature (F): 98.4 Height (in): 69 Pulse (bpm): 85 Weight (lbs): 163 Respiratory Rate (breaths/min): 18 Body Mass Index (BMI): 24.1 Blood Pressure (mmHg): 147/94 Reference Range: 80 - 120 mg / dl Electronic Signature(s) Signed: 06/13/2020 3:42:07 PM By: Carlene Coria RN Entered By: Carlene Coria on 06/13/2020 08:26:05

## 2020-06-17 ENCOUNTER — Encounter: Payer: Self-pay | Admitting: Vascular Surgery

## 2020-06-17 ENCOUNTER — Encounter (HOSPITAL_BASED_OUTPATIENT_CLINIC_OR_DEPARTMENT_OTHER): Payer: Medicare Other | Admitting: Internal Medicine

## 2020-06-17 ENCOUNTER — Ambulatory Visit (HOSPITAL_COMMUNITY)
Admission: RE | Admit: 2020-06-17 | Discharge: 2020-06-17 | Disposition: A | Discharge status: Home / Self Care | Payer: Medicare Other | Source: Ambulatory Visit | Attending: Vascular Surgery | Admitting: Vascular Surgery

## 2020-06-17 ENCOUNTER — Ambulatory Visit (INDEPENDENT_AMBULATORY_CARE_PROVIDER_SITE_OTHER): Payer: Medicare Other | Admitting: Vascular Surgery

## 2020-06-17 ENCOUNTER — Other Ambulatory Visit: Payer: Self-pay | Admitting: Internal Medicine

## 2020-06-17 ENCOUNTER — Encounter (HOSPITAL_COMMUNITY): Payer: Self-pay

## 2020-06-17 ENCOUNTER — Other Ambulatory Visit: Payer: Self-pay

## 2020-06-17 VITALS — BP 135/77 | HR 63 | Temp 97.8°F | Resp 20 | Ht 69.0 in | Wt 160.8 lb

## 2020-06-17 DIAGNOSIS — I872 Venous insufficiency (chronic) (peripheral): Secondary | ICD-10-CM

## 2020-06-17 DIAGNOSIS — I87323 Chronic venous hypertension (idiopathic) with inflammation of bilateral lower extremity: Secondary | ICD-10-CM | POA: Diagnosis not present

## 2020-06-17 NOTE — Progress Notes (Signed)
Vascular and Vein Specialist of [facility]  Patient name: [name] MRN: [id number] DOB: [birth year] Sex: female  REASON FOR VISIT: Follow-up venous stasis ulcer left leg  HPI: [name] is a 86 y.o. female here today with her husband for discussion of left leg venous stasis ulcer.  She apparently had venous ulcers of her right lateral leg and these healed with appropriate compression and conservative therapy.  She has had difficulty heel ulcer over her left lateral ankle.  Had biopsy showing stasis changes but no malignancy.  She has been treating at the wound center with 3 layer wraps.  Past Medical History:  Diagnosis Date  . Allergic rhinitis   . Anemia   . Breast cancer, left breast ([place]) 01/15/15   Invasive Mammary  . Chronic venous insufficiency   . Hematuria    negative workup - Dr. Reece Agar  . Hypertension   . Migraine headache   . MVP (mitral valve prolapse)   . PAF (paroxysmal atrial fibrillation) ([place])   . SVT (supraventricular tachycardia) ([place]) 06/2010   s/p AVNRT ablation  . Wears glasses     Family History  Problem Relation Age of Onset  . Hypertension Mother   . Heart disease Mother   . Arthritis Mother   . Other Father        hardening of the arteries  . Heart Problems Sister        pacemaker  . Heart attack Brother   . Throat cancer Paternal Uncle   . Breast cancer Sister        Tremaine Fuhriman 67s  . Alzheimer's disease Sister   . Pulmonary disease Sister   . Breast cancer Daughter 71       Negative genetic testing   . Breast cancer Paternal Aunt        dx over 44  . Cancer Paternal Aunt        cancer in her leg  . Breast cancer Cousin        multiple paternal cousin    SOCIAL HISTORY: Social History   Tobacco Use  . Smoking status: Never Smoker  . Smokeless tobacco: Never Used  . Tobacco comment: did smoke about 2cigs long time about 54 plus years ago  Substance Use Topics  . Alcohol use: Yes     Alcohol/week: 0.0 standard drinks    Comment: once a week    Allergies  Allergen Reactions  . Adhesive [Tape] Rash    Including EKG electrodes and the like.  . Codeine Other (See Comments)    unknown  . Latex Itching  . Percodan [Oxycodone-Aspirin] Nausea And Vomiting    Nausea     Current Outpatient Medications  Medication Sig Dispense Refill  . antiseptic oral rinse (BIOTENE) LIQD 15 mLs by Mouth Rinse route as needed for dry mouth.    . Carboxymethylcellul-Glycerin (LUBRICATING EYE DROPS OP) Apply 1 drop to eye daily as needed (dry eyes).    . Cyanocobalamin (B-12) 1000 MCG TABS Take 1 tablet by mouth daily.    Marland Kitchen ELIQUIS 5 MG TABS tablet TAKE 1 TABLET BY MOUTH  TWICE DAILY 180 tablet 1  . hydrochlorothiazide (HYDRODIURIL) 25 MG tablet Take 1 tablet (25 mg total) by mouth daily. 90 tablet 3  . levothyroxine (SYNTHROID, LEVOTHROID) 88 MCG tablet Take 88 mcg by mouth daily before breakfast.     . losartan (COZAAR) 50 MG tablet TAKE 1 TABLET BY MOUTH  DAILY 90 tablet 3  . MYRBETRIQ  50 MG TB24 tablet Take 50 mg by mouth daily.    . raNITIdine HCl (WAL-ZAN 75 PO) Take 1 tablet by mouth daily as needed.     . traMADol (ULTRAM) 50 MG tablet Take 50 mg by mouth 3 (three) times daily as needed.    . triamcinolone cream (KENALOG) 0.1 % Apply 1 application topically daily as needed.     . Vitamin D, Ergocalciferol, (DRISDOL) 50000 units CAPS capsule Take 50,000 Units by mouth every 7 (seven) days.     No current facility-administered medications for this visit.    REVIEW OF SYSTEMS:  [X]  denotes positive finding, [ ]  denotes negative finding Cardiac  Comments:  Chest pain or chest pressure:    Shortness of breath upon exertion:    Short of breath when lying flat:    Irregular heart rhythm:        Vascular    Pain in calf, thigh, or hip brought on by ambulation:    Pain in feet at night that wakes you up from your sleep:     Blood clot in your veins:    Leg swelling:  x          PHYSICAL EXAM: Vitals:   06/17/20 1241  BP: 135/77  Pulse: 63  Resp: 20  Temp: 97.8 F (36.6 C)  SpO2: 95%  Weight: 160 lb 12.8 oz (72.9 kg)  Height: 5\' 9"  (1.753 m)    GENERAL: The patient is a well-nourished female, in no acute distress. The vital signs are documented above. CARDIOVASCULAR: 2+ radial and 2+ popliteal pulses bilaterally.  I do not feel pedal pulses.  She does have biphasic dorsalis pedis signals bilaterally PULMONARY: There is good air exchange  MUSCULOSKELETAL: There are no major deformities or cyanosis. NEUROLOGIC: No focal weakness or paresthesias are detected. SKIN: 3 cm open ulcer over the lateral aspect of her left distal ankle above her malleolus.  Good granulating base. PSYCHIATRIC: The patient has a normal affect.  DATA:  Noninvasive studies from April 2021 reviewed and these were compared from similar studies from April 2019.  She does not have any evidence of significant superficial reflux.  Her saphenous vein is not dilated.  She in fact does not have any evidence of significant deep venous reflux.  MEDICAL ISSUES: Venous stasis disease with venous ulceration.  She does have multiple tributaries of small veins throughout her lower extremities with obvious changes of chronic venous stasis disease.  Interestingly her venous reflux exams have failed to show any evidence of significant reflux in her superficial or deep system.  She certainly does not have anything that would suggest to expect approval with ablation of her saphenous vein.  The saphenous vein is relatively small in caliber throughout its course.  She and her husband were reassured with this and would continue with local treatment at the wound center.  Apparently there is plan for Apligraf placement on Friday.  I do feel that she has adequate arterial flow for healing of her venous stasis wound.  She will see [place] again on an as-needed basis    Rosetta Posner, MD [facility] and Ve[facility]  Specialists of [facility] Tel [phone] Pager [phone]

## 2020-06-17 NOTE — Progress Notes (Signed)
AKAYLAH, LALLEY ([id number]) Visit Report for 06/17/2020 SuperBill Details Patient Name: Date of Service: [place], [name] 06/17/2020 Medical Record Number: [id number] Patient Account Number: [id number] Date of Birth/Sex: Treating RN: Jun 09, 1933 (86 y.o. [name] Primary Care Provider: Lajean Manes T Other Clinician: Referring Provider: Treating Provider/Extender: Evelena Peat in Treatment: 25 Diagnosis Coding ICD-10 Codes Code Description [phone] Chronic venous hypertension (idiopathic) with inflammation of right lower extremity L97.828 Non-pressure chronic ulcer of other part of left lower leg with other specified severity I87.322 Chronic venous hypertension (idiopathic) with inflammation of left lower extremity L97.522 Non-pressure chronic ulcer of other part of left foot with fat layer exposed Facility Procedures The patient participates with Medicare or their insurance follows the Medicare Facility Guidelines CPT4 Code Description Modifier Quantity [id number] (Facility Use Only) [phone] - APPLY [place] 1 Electronic Signature(s) Signed: 06/17/2020 5:09:59 PM By: Linton Ham MD Signed: 06/17/2020 5:18:40 PM By: Kela Millin Entered By: Kela Millin on 06/17/2020 16:07:12

## 2020-06-17 NOTE — Telephone Encounter (Signed)
Prescription refill request for Eliquis received.  Last office visit: 01/10/2020, Lovena Le Scr: 0.85, 02/27/2020 Age:  86 y.o. Weight: 76.2 kg   Prescription refill sent.

## 2020-06-17 NOTE — Progress Notes (Signed)
DREA, JUREWICZ ([id number]) Visit Report for 06/17/2020 Arrival Information Details Patient Name: Date of Service: [place], [name] 06/17/2020 4:00 PM Medical Record Number: [id number] Patient Account Number: [id number] Date of Birth/Sex: Treating RN: 1933/03/03 (86 y.o. [name] Primary Care Emslee Lopezmartinez: Lajean Manes T Other Clinician: Referring Terrance Lanahan: Treating Noa Constante/Extender: Evelena Peat in Treatment: 25 Visit Information History Since Last Visit Added or deleted any medications: No Patient Arrived: Ambulatory Any new allergies or adverse reactions: No Arrival Time: 16:03 Had a fall or experienced change in No Accompanied By: husband activities of daily living that may affect Transfer Assistance: None risk of falls: Patient Identification Verified: Yes Signs or symptoms of abuse/neglect since last visito No Secondary Verification Process Completed: Yes Hospitalized since last visit: No Patient Requires Transmission-Based Precautions: No Implantable device outside of the clinic excluding No Patient Has Alerts: Yes cellular tissue based products placed in the center Patient Alerts: Patient on Blood Thinner since last visit: Right ABI:0.99 Pain Present Now: No Electronic Signature(s) Signed: 06/17/2020 5:18:40 PM By: Kela Millin Entered By: Kela Millin on 06/17/2020 16:04:07 -------------------------------------------------------------------------------- Compression Therapy Details Patient Name: Date of Service: [name] 06/17/2020 4:00 PM Medical Record Number: [id number] Patient Account Number: [id number] Date of Birth/Sex: Treating RN: 1932/12/18 (86 y.o. [name] Primary Care Rhonda Vangieson: Patrina Levering Other Clinician: Referring Terryn Rosenkranz: Treating Fayette Hamada/Extender: [name] in Treatment: 25 Compression Therapy Performed for Wound Assessment: Wound #5 Left,Lateral Lower  Leg Performed By: Clinician Kela Millin, RN Compression Type: Three Layer Electronic Signature(s) Signed: 06/17/2020 5:18:40 PM By: Kela Millin Entered By: Kela Millin on 06/17/2020 16:05:37 -------------------------------------------------------------------------------- Compression Therapy Details Patient Name: Date of Service: [name] 06/17/2020 4:00 PM Medical Record Number: [id number] Patient Account Number: [id number] Date of Birth/Sex: Treating RN: [birth year] (86 y.o. [name] Primary Care Curlie Macken: Patrina Levering Other Clinician: Referring Quentin Shorey: Treating Ann Bohne/Extender: [name] in Treatment: 25 Compression Therapy Performed for Wound Assessment: Wound #7 Left,Distal,Lateral Lower Leg Performed By: Clinician Kela Millin, RN Compression Type: Three Layer Electronic Signature(s) Signed: 06/17/2020 5:18:40 PM By: Kela Millin Entered By: Kela Millin on 06/17/2020 16:05:38 -------------------------------------------------------------------------------- Encounter Discharge Information Details Patient Name: Date of Service: [name], . 06/17/2020 4:00 PM Medical Record Number: [id number] Patient Account Number: [id number] Date of Birth/Sex: Treating RN: 08-Mar-1933 (86 y.o. [name] Primary Care Avraham Benish: Patrina Levering Other Clinician: Referring Marolyn Urschel: Treating Hooria Gasparini/Extender: Evelena Peat in Treatment: 25 Encounter Discharge Information Items Discharge Condition: Stable Ambulatory Status: Ambulatory Discharge Destination: Home Transportation: Private Auto Accompanied By: husband Schedule Follow-up Appointment: Yes Clinical Summary of Care: Patient Declined Electronic Signature(s) Signed: 06/17/2020 5:18:40 PM By: Kela Millin Entered By: Kela Millin on 06/17/2020  16:07:02 -------------------------------------------------------------------------------- Patient/Caregiver Education Details Patient Name: Date of Service: [place], [name] 7/6/2021andnbsp4:00 PM Medical Record Number: [id number] Patient Account Number: [id number] Date of Birth/Gender: Treating RN: 30-Jul-1933 (86 y.o. [name] Primary Care Physician: Lajean Manes T Other Clinician: Referring Physician: Treating Physician/Extender: Evelena Peat in Treatment: 25 Education Assessment Education Provided To: Patient Education Topics Provided Pain: Handouts: A Guide to Pain Control Methods: Explain/Verbal Responses: State content correctly Safety: Handouts: Personal Safety Methods: Explain/Verbal Responses: State content correctly Wound/Skin Impairment: Handouts: Caring for Your Ulcer Methods: Explain/Verbal Responses: State content correctly Electronic Signature(s) Signed: 06/17/2020 5:18:40 PM By: Kela Millin Entered By: Kela Millin on 06/17/2020 16:06:45 -------------------------------------------------------------------------------- Wound Assessment  Details Patient Name: Date of Service: [place], [name] 06/17/2020 4:00 PM Medical Record Number: [id number] Patient Account Number: [id number] Date of Birth/Sex: Treating RN: [birth year] (86 y.o. [name] Primary Care Sharmila Wrobleski: Lajean Manes T Other Clinician: Referring Kynlee Koenigsberg: Treating Franchot Pollitt/Extender: [name] in Treatment: 25 Wound Status Wound Number: 5 Primary Venous Leg Ulcer Etiology: Wound Location: Left, Lateral Lower Leg Wound Open Wounding Event: Gradually Appeared Status: Date Acquired: 03/21/2020 Comorbid Cataracts, Anemia, Arrhythmia, Congestive Heart Failure, Weeks Of Treatment: 12 History: Hypertension, Peripheral Venous Disease, Received Radiation Clustered Wound: No Wound Measurements Length: (cm)  3 Width: (cm) 2.8 Depth: (cm) 0.3 Area: (cm) 6.597 Volume: (cm) 1.979 % Reduction in Area: -1211.5% % Reduction in Volume: -3858% Epithelialization: Small (1-33%) Tunneling: No Undermining: No Wound Description Classification: Full Thickness Without Exposed Support Structures Wound Margin: Flat and Intact Exudate Amount: Medium Exudate Type: Serosanguineous Exudate Color: red, brown Foul Odor After Cleansing: No Slough/Fibrino Yes Wound Bed Granulation Amount: Medium (34-66%) Exposed Structure Granulation Quality: Red Fascia Exposed: No Necrotic Amount: Medium (34-66%) Fat Layer (Subcutaneous Tissue) Exposed: Yes Necrotic Quality: Adherent Slough Tendon Exposed: No Muscle Exposed: No Joint Exposed: No Bone Exposed: No Treatment Notes Wound #5 (Left, Lateral Lower Leg) 1. Cleanse With Wound Cleanser 2. Periwound Care Moisturizing lotion TCA Cream 3. Primary Dressing Applied Hydrofera Blue 4. Secondary Dressing ABD Pad 6. Support Layer Applied 3 layer compression wrap Notes kerlix instead of cotton. netting Electronic Signature(s) Signed: 06/17/2020 5:18:40 PM By: Kela Millin Entered By: Kela Millin on 06/17/2020 16:05:04 -------------------------------------------------------------------------------- Wound Assessment Details Patient Name: Date of Service: [name] 06/17/2020 4:00 PM Medical Record Number: [id number] Patient Account Number: [id number] Date of Birth/Sex: Treating RN: [birth year] (86 y.o. [name] Primary Care Eban Weick: Lajean Manes T Other Clinician: Referring Kian Ottaviano: Treating Alfred Harrel/Extender: [name] in Treatment: 25 Wound Status Wound Number: 7 Primary Venous Leg Ulcer Etiology: Wound Location: Left, Distal, Lateral Lower Leg Wound Open Wounding Event: Gradually Appeared Status: Date Acquired: 05/30/2020 Comorbid Cataracts, Anemia, Arrhythmia, Congestive Heart  Failure, Weeks Of Treatment: 2 History: Hypertension, Peripheral Venous Disease, Received Radiation Clustered Wound: No Wound Measurements Length: (cm) 1 Width: (cm) 1.1 Depth: (cm) 0.1 Area: (cm) 0.864 Volume: (cm) 0.086 % Reduction in Area: -450.3% % Reduction in Volume: -437.5% Epithelialization: None Tunneling: No Undermining: No Wound Description Classification: Full Thickness Without Exposed Support Structures Wound Margin: Flat and Intact Exudate Amount: Medium Exudate Type: Serosanguineous Exudate Color: red, brown Foul Odor After Cleansing: No Slough/Fibrino Yes Wound Bed Granulation Amount: None Present (0%) Exposed Structure Necrotic Amount: Large (67-100%) Fascia Exposed: No Necrotic Quality: Adherent Slough Fat Layer (Subcutaneous Tissue) Exposed: Yes Tendon Exposed: No Muscle Exposed: No Joint Exposed: No Bone Exposed: No Treatment Notes Wound #7 (Left, Distal, Lateral Lower Leg) 1. Cleanse With Wound Cleanser 2. Periwound Care Moisturizing lotion TCA Cream 3. Primary Dressing Applied Hydrofera Blue 4. Secondary Dressing ABD Pad 6. Support Layer Applied 3 layer compression wrap Notes kerlix instead of cotton. netting Electronic Signature(s) Signed: 06/17/2020 5:18:40 PM By: Kela Millin Entered By: Kela Millin on 06/17/2020 16:05:18 -------------------------------------------------------------------------------- Vitals Details Patient Name: Date of Service: [name], [place]. 06/17/2020 4:00 PM Medical Record Number: [id number] Patient Account Number: [id number] Date of Birth/Sex: Treating RN: 1933-10-16 (86 y.o. [name] Primary Care Dayden Viverette: Patrina Levering Other Clinician: Referring Ronan Duecker: Treating Jasper Ruminski/Extender: [name] in Treatment: 25 Vital Signs Time Taken: 16:00 Temperature (F): 98 Height (  in): 69 Pulse (bpm): 70 Weight (lbs): 163 Respiratory Rate (breaths/min):  19 Body Mass Index (BMI): 24.1 Blood Pressure (mmHg): 123/56 Reference Range: 80 - 120 mg / dl Electronic Signature(s) Signed: 06/17/2020 5:18:40 PM By: Kela Millin Entered By: Kela Millin on 06/17/2020 16:04:25

## 2020-06-20 ENCOUNTER — Other Ambulatory Visit: Payer: Self-pay

## 2020-06-20 ENCOUNTER — Encounter (HOSPITAL_BASED_OUTPATIENT_CLINIC_OR_DEPARTMENT_OTHER): Payer: Medicare Other | Admitting: Internal Medicine

## 2020-06-20 DIAGNOSIS — I87323 Chronic venous hypertension (idiopathic) with inflammation of bilateral lower extremity: Secondary | ICD-10-CM | POA: Diagnosis not present

## 2020-06-24 NOTE — Progress Notes (Addendum)
Tracy, Green ([id number]) Visit Report for 06/20/2020 Arrival Information Details Patient Name: Date of Service: [place], [name] 06/20/2020 8:15 A M Medical Record Number: [id number] Patient Account Number: [id number] Date of Birth/Sex: Treating RN: [birth year] (86 y.o. [name] Primary Care Cartha Rotert: Patrina Levering Other Clinician: Minerva Fester Referring Yamina Lenis: Treating Carisa Backhaus/Extender: Evelena Peat in Treatment: 26 Visit Information History Since Last Visit All ordered tests and consults were completed: Yes Patient Arrived: Ambulatory Added or deleted any medications: No Arrival Time: 08:28 Any new allergies or adverse reactions: No Accompanied By: husband Had a fall or experienced change in No Transfer Assistance: None activities of daily living that may affect Patient Requires Transmission-Based Precautions: No risk of falls: Patient Has Alerts: Yes Signs or symptoms of abuse/neglect since last visito No Patient Alerts: Patient on Blood Thinner Hospitalized since last visit: No Right ABI:0.99 Implantable device outside of the clinic excluding No cellular tissue based products placed in the center since last visit: Pain Present Now: Yes Electronic Signature(s) Signed: 06/23/2020 9:27:12 AM By: Minerva Fester Entered By: Minerva Fester on 06/20/2020 08:30:24 -------------------------------------------------------------------------------- Compression Therapy Details Patient Name: Date of Service: [name]. 06/20/2020 8:15 A M Medical Record Number: [id number] Patient Account Number: [id number] Date of Birth/Sex: Treating RN: 04-19-1933 (86 y.o. [name] Primary Care Airlie Blumenberg: Patrina Levering Other Clinician: Referring Cowan Pilar: Treating Ambriella Kitt/Extender: [name] in Treatment: 26 Compression Therapy Performed for Wound Assessment: Wound #5 Left,Lateral Lower Leg Performed By:  Clinician [name], RN Compression Type: Four Layer Post Procedure Diagnosis Same as Pre-procedure Electronic Signature(s) Signed: 06/20/2020 5:57:27 PM By: Kela Millin Entered By: Kela Millin on 06/20/2020 09:02:19 -------------------------------------------------------------------------------- Compression Therapy Details Patient Name: Date of Service: [name]. 06/20/2020 8:15 A M Medical Record Number: [id number] Patient Account Number: [id number] Date of Birth/Sex: Treating RN: [birth year] (86 y.o. [name] Primary Care Ersa Delaney: Patrina Levering Other Clinician: Referring Handsome Anglin: Treating Martena Emanuele/Extender: [name] in Treatment: 26 Compression Therapy Performed for Wound Assessment: Wound #7 Left,Distal,Lateral Lower Leg Performed By: Clinician [name], RN Compression Type: Four Layer Post Procedure Diagnosis Same as Pre-procedure Electronic Signature(s) Signed: 06/20/2020 5:57:27 PM By: Kela Millin Entered By: Kela Millin on 06/20/2020 09:02:19 -------------------------------------------------------------------------------- Encounter Discharge Information Details Patient Name: Date of Service: [name]. 06/20/2020 8:15 A M Medical Record Number: [id number] Patient Account Number: [id number] Date of Birth/Sex: Treating RN: 31-Jul-1933 (86 y.o. [name] Primary Care Manjinder Breau: Patrina Levering Other Clinician: Referring Laisha Rau: Treating Elizabth Palka/Extender: Evelena Peat in Treatment: 26 Encounter Discharge Information Items Post Procedure Vitals Discharge Condition: Stable Temperature (F): 98.0 Ambulatory Status: Cane Pulse (bpm): 73 Discharge Destination: Home Respiratory Rate (breaths/min): 14 Transportation: Private Auto Blood Pressure (mmHg): 155/92 Accompanied By: husband Schedule Follow-up Appointment: Yes Clinical Summary of Care:  Patient Declined Electronic Signature(s) Signed: 06/23/2020 5:04:17 PM By: [name] RN, BSN Entered By: [name] on 06/20/2020 14:27:40 -------------------------------------------------------------------------------- Lower Extremity Assessment Details Patient Name: Date of Service: [name]. 06/20/2020 8:15 A M Medical Record Number: [id number] Patient Account Number: [id number] Date of Birth/Sex: Treating RN: 06-16-1933 (86 y.o. [name] Primary Care Keno Caraway: Patrina Levering Other Clinician: Referring Nilah Belcourt: Treating Arias Weinert/Extender: Venita Lick T Weeks in Treatment: 26 Edema Assessment Assessed: [Left: No] [Right: No] Edema: [Left: N] [Right: o] Calf Left: Right: Point of Measurement: 41 cm From Medial Instep 34 cm  cm Ankle Left: Right: Point of Measurement: 14 cm From Medial Instep 23 cm cm Vascular Assessment Pulses: Dorsalis Pedis Palpable: [Left:Yes] Electronic Signature(s) Signed: 06/20/2020 5:57:27 PM By: Kela Millin Signed: 06/23/2020 9:27:12 AM By: Minerva Fester Entered By: Minerva Fester on 06/20/2020 08:43:44 -------------------------------------------------------------------------------- Multi Wound Chart Details Patient Name: Date of Service: [name]. 06/20/2020 8:15 A M Medical Record Number: [id number] Patient Account Number: [id number] Date of Birth/Sex: Treating RN: 04/04/1933 (86 y.o. [name] Primary Care Lawton Dollinger: Lajean Manes T Other Clinician: Referring Tymesha Ditmore: Treating Syrina Wake/Extender: [name] in Treatment: 26 Vital Signs Height(in): 69 Pulse(bpm): 73 Weight(lbs): 163 Blood Pressure(mmHg): 155/92 Body Mass Index(BMI): 24 Temperature(F): 98.0 Respiratory Rate(breaths/min): 14 Photos: [5:No Photos Left, Lateral Lower Leg] [7:No Photos Left, Distal, Lateral Lower Leg] [N/A:N/A N/A] Wound Location: [5:Gradually Appeared]  [7:Gradually Appeared] [N/A:N/A] Wounding Event: [5:Venous Leg Ulcer] [7:Venous Leg Ulcer] [N/A:N/A] Primary Etiology: [5:Cataracts, Anemia, Arrhythmia,] [7:Cataracts, Anemia, Arrhythmia,] [N/A:N/A] Comorbid History: [5:Congestive Heart Failure, Hypertension, Peripheral Venous Disease, Received Radiation 03/21/2020] [7:Congestive Heart Failure, Hypertension, Peripheral Venous Disease, Received Radiation 05/30/2020] [N/A:N/A] Date Acquired: [5:13] [7:3] [N/A:N/A] Weeks of Treatment: [5:Open] [7:Open] [N/A:N/A] Wound Status: [5:2.8x2.8x0.2] [7:0.8x1x0.1] [N/A:N/A] Measurements L x W x D (cm) [5:6.158] [7:0.628] [N/A:N/A] A (cm) : rea [5:1.232] [0:9.735] [N/A:N/A] Volume (cm) : [5:-1124.30%] [7:-300.00%] [N/A:N/A] % Reduction in Area: [5:-2364.00%] [7:-293.70%] [N/A:N/A] % Reduction in Volume: [5:Full Thickness Without Exposed] [7:Full Thickness Without Exposed] [N/A:N/A] Classification: [5:Support Structures Medium] [7:Support Structures Medium] [N/A:N/A] Exudate Amount: [5:Serosanguineous] [7:Serosanguineous] [N/A:N/A] Exudate Type: [5:red, brown] [7:red, brown] [N/A:N/A] Exudate Color: [5:Flat and Intact] [7:Flat and Intact] [N/A:N/A] Wound Margin: [5:Medium (34-66%)] [7:Small (1-33%)] [N/A:N/A] Granulation Amount: [5:Red] [7:Pink] [N/A:N/A] Granulation Quality: [5:Medium (34-66%)] [7:Large (67-100%)] [N/A:N/A] Necrotic Amount: [5:Fat Layer (Subcutaneous Tissue)] [7:Fat Layer (Subcutaneous Tissue)] [N/A:N/A] Exposed Structures: [5:Exposed: Yes Fascia: No Tendon: No Muscle: No Joint: No Bone: No Small (1-33%)] [7:Exposed: Yes Fascia: No Tendon: No Muscle: No Joint: No Bone: No Small (1-33%)] [N/A:N/A] Epithelialization: [5:Cellular or Tissue Based Product] [7:Cellular or Tissue Based Product] [N/A:N/A] Procedures Performed: [5:Compression Therapy] [7:Compression [place] Treatment Notes Electronic Signature(s) Signed: 06/20/2020 5:57:27 PM By: Kela Millin Signed: 06/24/2020 8:12:39 AM  By: Linton Ham MD Entered By: Linton Ham on 06/20/2020 09:11:16 -------------------------------------------------------------------------------- Multi-Disciplinary Care Plan Details Patient Name: Date of Service: [place], [place]. 06/20/2020 8:15 A M Medical Record Number: [id number] Patient Account Number: [id number] Date of Birth/Sex: Treating RN: [birth year] (86 y.o. [name] Primary Care Dandrea Widdowson: Patrina Levering Other Clinician: Referring An Lannan: Treating Olin Gurski/Extender: Evelena Peat in Treatment: 26 Active Inactive Abuse / Safety / Falls / Self Care Management Nursing Diagnoses: Potential for falls Goals: Patient/caregiver will verbalize understanding of skin care regimen Date Initiated: 12/20/2019 Date Inactivated: 02/14/2020 Target Resolution Date: 02/22/2020 Goal Status: Met Patient/caregiver will verbalize/demonstrate understanding of what to do in case of emergency Date Initiated: 12/20/2019 Target Resolution Date: 07/04/2020 Goal Status: Active Interventions: Assess fall risk on admission and as needed Provide education on fall prevention Notes: Pain, Acute or Chronic Nursing Diagnoses: Pain, acute or chronic: actual or potential Potential alteration in comfort, pain Goals: Patient will verbalize adequate pain control and receive pain control interventions during procedures as needed Date Initiated: 12/20/2019 Date Inactivated: 02/14/2020 Target Resolution Date: 02/22/2020 Goal Status: Met Patient/caregiver will verbalize comfort level met Date Initiated: 12/20/2019 Target Resolution Date: 07/04/2020 Goal Status: Active Interventions: Complete pain assessment as per visit requirements Provide education on pain management Notes: Wound/Skin Impairment Nursing Diagnoses: Knowledge deficit related to  ulceration/compromised skin integrity Goals: Patient/caregiver will verbalize understanding of skin care regimen Date  Initiated: 12/20/2019 Target Resolution Date: 07/04/2020 Goal Status: Active Interventions: Assess patient/caregiver ability to perform ulcer/skin care regimen upon admission and as needed Provide education on ulcer and skin care Treatment Activities: Skin care regimen initiated : 12/20/2019 Topical wound management initiated : 12/20/2019 Notes: Electronic Signature(s) Signed: 06/20/2020 5:57:27 PM By: Kela Millin Entered By: Kela Millin on 06/20/2020 08:29:19 -------------------------------------------------------------------------------- Pain Assessment Details Patient Name: Date of Service: [name]. 06/20/2020 8:15 A M Medical Record Number: [id number] Patient Account Number: [id number] Date of Birth/Sex: Treating RN: 1933-09-23 (86 y.o. [name] Primary Care Zayveon Raschke: Patrina Levering Other Clinician: Referring Manna Gose: Treating Andrez Lieurance/Extender: Evelena Peat in Treatment: 26 Active Problems Location of Pain Severity and Description of Pain Patient Has Paino Yes Site Locations Pain Location: Pain in Ulcers Duration of the Pain. Constant / Intermittento Constant Rate the pain. Current Pain Level: 6 Worst Pain Level: 10 Least Pain Level: 0 Tolerable Pain Level: 10 Character of Pain Describe the Pain: Dull Pain Management and Medication Current Pain Management: Electronic Signature(s) Signed: 06/20/2020 5:57:27 PM By: Kela Millin Signed: 06/23/2020 9:27:12 AM By: Minerva Fester Entered By: Minerva Fester on 06/20/2020 08:41:35 -------------------------------------------------------------------------------- Patient/Caregiver Education Details Patient Name: Date of Service: [place], [name] 7/9/2021andnbsp8:15 A M Medical Record Number: [id number] Patient Account Number: [id number] Date of Birth/Gender: Treating RN: 1933/10/26 (86 y.o. [name] Primary Care Physician: Lajean Manes T Other  Clinician: Referring Physician: Treating Physician/Extender: Evelena Peat in Treatment: 26 Education Assessment Education Provided To: Patient Education Topics Provided Pain: Handouts: A Guide to Pain Control Methods: Explain/Verbal Responses: State content correctly Safety: Handouts: Personal Safety Methods: Explain/Verbal Responses: State content correctly Wound/Skin Impairment: Handouts: Caring for Your Ulcer Methods: Explain/Verbal Responses: State content correctly Electronic Signature(s) Signed: 06/20/2020 5:57:27 PM By: Kela Millin Entered By: Kela Millin on 06/20/2020 08:29:42 -------------------------------------------------------------------------------- Wound Assessment Details Patient Name: Date of Service: [name]. 06/20/2020 8:15 A M Medical Record Number: [id number] Patient Account Number: [id number] Date of Birth/Sex: Treating RN: 04-05-1933 (86 y.o. [name] Primary Care Eugean Arnott: Patrina Levering Other Clinician: Minerva Fester Referring Briley Sulton: Treating Avereigh Spainhower/Extender: [name] in Treatment: 26 Wound Status Wound Number: 5 Primary Venous Leg Ulcer Etiology: Wound Location: Left, Lateral Lower Leg Wound Open Wounding Event: Gradually Appeared Status: Date Acquired: 03/21/2020 Comorbid Cataracts, Anemia, Arrhythmia, Congestive Heart Failure, Weeks Of Treatment: 13 History: Hypertension, Peripheral Venous Disease, Received Radiation Clustered Wound: No Photos Photo Uploaded By: Mikeal Hawthorne on 06/23/2020 08:43:00 Wound Measurements Length: (cm) 2.8 Width: (cm) 2.8 Depth: (cm) 0.2 Area: (cm) 6.158 Volume: (cm) 1.232 % Reduction in Area: -1124.3% % Reduction in Volume: -2364% Epithelialization: Small (1-33%) Tunneling: No Undermining: No Wound Description Classification: Full Thickness Without Exposed Support Structures Wound Margin: Flat and  Intact Exudate Amount: Medium Exudate Type: Serosanguineous Exudate Color: red, brown Foul Odor After Cleansing: No Slough/Fibrino Yes Wound Bed Granulation Amount: Medium (34-66%) Exposed Structure Granulation Quality: Red Fascia Exposed: No Necrotic Amount: Medium (34-66%) Fat Layer (Subcutaneous Tissue) Exposed: Yes Necrotic Quality: Adherent Slough Tendon Exposed: No Muscle Exposed: No Joint Exposed: No Bone Exposed: No Treatment Notes Wound #5 (Left, Lateral Lower Leg) 1. Cleanse With Soap and water 2. Periwound Care Moisturizing lotion 3. Primary Dressing Applied Cellular Based Tissue Product 4. Secondary Dressing ABD Pad Other secondary dressing (specify in notes) 6. Support Layer Applied 4 layer  compression wrap Notes Apligraf applied by MD, secured with adaptic and steri strips Electronic Signature(s) Signed: 06/20/2020 5:57:27 PM By: Kela Millin Signed: 06/23/2020 5:04:17 PM By: [name] RN, BSN Entered By: [name] on 06/20/2020 08:51:30 -------------------------------------------------------------------------------- Wound Assessment Details Patient Name: Date of Service: [name]. 06/20/2020 8:15 A M Medical Record Number: [id number] Patient Account Number: [id number] Date of Birth/Sex: Treating RN: [birth year] (86 y.o. [name] Primary Care Jejuan Scala: Other Clinician: Lajean Manes T Referring Joseantonio Dittmar: Treating Linder Prajapati/Extender: [name] in Treatment: 26 Wound Status Wound Number: 7 Primary Venous Leg Ulcer Etiology: Wound Location: Left, Distal, Lateral Lower Leg Wound Open Wounding Event: Gradually Appeared Status: Date Acquired: 05/30/2020 Comorbid Cataracts, Anemia, Arrhythmia, Congestive Heart Failure, Weeks Of Treatment: 3 History: Hypertension, Peripheral Venous Disease, Received Radiation Clustered Wound: No Photos Photo Uploaded By: Mikeal Hawthorne on 06/23/2020  08:43:01 Wound Measurements Length: (cm) 0.8 Width: (cm) 1 Depth: (cm) 0.1 Area: (cm) 0.628 Volume: (cm) 0.063 % Reduction in Area: -300% % Reduction in Volume: -293.7% Epithelialization: Small (1-33%) Tunneling: No Undermining: No Wound Description Classification: Full Thickness Without Exposed Support Struc Wound Margin: Flat and Intact Exudate Amount: Medium Exudate Type: Serosanguineous Exudate Color: red, brown tures Foul Odor After Cleansing: No Slough/Fibrino Yes Wound Bed Granulation Amount: Small (1-33%) Exposed Structure Granulation Quality: Pink Fascia Exposed: No Necrotic Amount: Large (67-100%) Fat Layer (Subcutaneous Tissue) Exposed: Yes Necrotic Quality: Adherent Slough Tendon Exposed: No Muscle Exposed: No Joint Exposed: No Bone Exposed: No Treatment Notes Wound #7 (Left, Distal, Lateral Lower Leg) 1. Cleanse With Soap and water 2. Periwound Care Moisturizing lotion 3. Primary Dressing Applied Cellular Based Tissue Product 4. Secondary Dressing ABD Pad Other secondary dressing (specify in notes) 6. Support Layer Applied 4 layer compression wrap Notes Apligraf applied by MD, secured with adaptic and steri strips Electronic Signature(s) Signed: 06/23/2020 5:04:17 PM By: [name] RN, BSN Signed: 06/23/2020 5:04:17 PM By: [name] RN, BSN Entered By: [name] on 06/20/2020 08:52:07 -------------------------------------------------------------------------------- [place] Details Patient Name: Date of Service: [name], [place] J. 06/20/2020 8:15 A M Medical Record Number: [id number] Patient Account Number: [id number] Date of Birth/Sex: Treating RN: [birth year] (86 y.o. [name] Primary Care Asiyah Pineau: Patrina Levering Other Clinician: Minerva Fester Referring Sally Menard: Treating Trypp Heckmann/Extender: Evelena Peat in Treatment: 26 Vital Signs Time Taken: 08:30 Temperature (F): 98.0 Height (in):  69 Pulse (bpm): 73 Weight (lbs): 163 Respiratory Rate (breaths/min): 14 Body Mass Index (BMI): 24.1 Blood Pressure (mmHg): 155/92 Reference Range: 80 - 120 mg / dl Electronic Signature(s) Signed: 06/23/2020 9:27:12 AM By: Minerva Fester Entered By: Minerva Fester on 06/20/2020 08:40:18

## 2020-06-24 NOTE — Progress Notes (Addendum)
[name], [name] ([id number]) Visit Report for 06/20/2020 Cellular or Tissue Based Product Details Patient Name: Date of Service: [place], [name] 06/20/2020 8:15 A M Medical Record Number: [id number] Patient Account Number: [id number] Date of Birth/Sex: Treating RN: 1933-10-01 (86 y.o. [name] Primary Care Provider: Lajean Manes T Other Clinician: Referring Provider: Treating Provider/Extender: Evelena Green in Treatment: 26 Cellular or Tissue Based Product Type Wound #5 Left,Lateral Lower Leg Applied to: Performed By: Physician [name]., MD Cellular or Tissue Based Product Type: Apligraf Level of Consciousness (Pre-procedure): Awake and Alert Pre-procedure Verification/Time Out Yes - 09:00 Taken: Location: trunk / arms / legs Wound Size (sq cm): 7.84 Product Size (sq cm): 34 Waste Size (sq cm): 0 Amount of Product Applied (sq cm): 34 Instrument Used: Blade, Forceps, Scissors Lot #: [id number] Order #: 1 Expiration Date: 06/25/2020 Fenestrated: Yes Instrument: Blade Reconstituted: Yes Solution Type: normal saline Solution Amount: 21m Lot #:: [id number] Expiration Date: 08/12/2021 Secured: Yes Secured With: Steri-Strips Dressing Applied: Yes Primary Dressing: adaptic Procedural Pain: 0 Post Procedural Pain: 0 Response to Treatment: Procedure was tolerated well Level of Consciousness (Post- Awake and Alert procedure): Post Procedure Diagnosis Same as Pre-procedure Electronic Signature(s) Signed: 06/24/2020 8:12:39 AM By: RLinton HamMD Entered By: RLinton Hamon 06/20/2020 09:11:26 -------------------------------------------------------------------------------- Cellular or Tissue Based Product Details Patient Name: Date of Service: [name] 06/20/2020 8:15 A M Medical Record Number: [id number] Account Number: [id number] of Birth/Sex: Treating RN: [id number](86y.o. FClearnce SorrelPrimary Care Provider: SLajean ManesT Other Clinician: Referring Provider: Treating Provider/Extender: REvelena Peatin Treatment: 26 Cellular or Tissue Based Product Type Wound #7 Left,Distal,Lateral Lower Leg Applied to: Performed By: Physician RRicard Dillon, MD Cellular or Tissue Based Product Type: Apligraf Level of Consciousness (Pre-procedure): Awake and Alert Pre-procedure Verification/Time Out Yes - 09:00 Taken: Location: trunk / arms / legs Wound Size (sq cm): 0.8 Product Size (sq cm): 10 Waste Size (sq cm): 0 Amount of Product Applied (sq cm): 10 Instrument Used: Blade, Forceps, Scissors Lot #: [id number] Order #: 1 Expiration Date: 06/25/2020 Fenestrated: Yes Instrument: Blade Reconstituted: Yes Solution Type: normal saline Solution Amount: 118mLot #: : [id number] Expiration Date: 08/12/2021 Secured: Yes Secured With: Steri-Strips Dressing Applied: Yes Primary Dressing: adaptic Procedural Pain: 0 Post Procedural Pain: 0 Response to Treatment: Procedure was tolerated well Level of Consciousness (Post- Awake and Alert procedure): Post Procedure Diagnosis Same as Pre-procedure Electronic Signature(s) Signed: 06/24/2020 8:12:39 AM By: RoLinton HamD Entered By: RoLinton Hamn 06/20/2020 09:11:34 -------------------------------------------------------------------------------- HPI Details Patient Name: Date of Service: KERobby Sermon7/08/2020 8:15 A M Medical Record Number: [id number] Account Number: [id number] [id number] Birth/Sex: Treating RN: [birth year].o. F)Tracy Sorrelrimary Care Provider: StPatrina Leveringther Clinician: Referring Provider: Treating Provider/Extender: Tracy Peatn Treatment: 26 History of Present Illness HPI Description: ADMISSION 12/20/2019 This is an 89ear old woman referred by her primary physician Dr. StFelipa Ethor review  of a wound on her right lateral lower leg. She was actually in this clinic on 2 separate occasions in 2010 and 2012 cared for by Dr. SeSherilyn CooterAt that point in time she had wounds on her right leg as well. [place] ago she noticed a scab building up on her right lateral lower leg this opened into a wound. There was no overt cause of this no trauma,  no infection that she is aware of. She has a history of chronic venous insufficiency and wears compression stockings fairly religiously indeed she is done well over the last 8 years since she was last in this clinic. She has been applying Vaseline on this and a covering phone. This is not progressing towards healing. Past medical history; interstitial lung disease, chronic atrial fibrillation status post pacemaker, osteoarthritis of the left knee, left breast CA, chronic repeat venous insufficiency. She takes Eliquis for her atrial fibrillation stroke prophylaxis. ABI in our clinic was 0.99 on the right 1/15; superficial wound on the right lateral calf in the setting of severe acute skin changes from chronic venous insufficiency and lymphedema. We used Iodoflex last week she complained of a lot of pain 1/22; this is a small but difficult wound on the right lateral calf in the setting of severe chronic venous insufficiency and secondary lymphedema. She continues to state the wounds things hurts when she is up on it but seems to be relieved by putting her leg up. I changed her to [facility] last week because the Iodoflex seem to be causing stinging. 1/29. This wound appears to be contracting somewhat. Changes of chronic venous insufficiency with secondary lymphedema 2/12; not as good as surface today and slightly bigger. I had to change her from Iodoflex to [facility] because of the stinging pain although she does not think it was any better on the [facility]. She comes into clinic today with a area on the medial left great toe. She  said she noticed blood on her sock last Saturday. She had some form of bunion surgery by Dr. Ila Mcgill her podiatrist sometime in 2019 she said he "shave the area". I could not find his operative report although I did see reference to the bunion in the area. 2/19; somewhat improved wound on the right lateral lower leg however the wound she came in on the bunion of her left great toe is actually I think larger still somewhat inflamed. We have been using Iodoflex 2/26; not much improvement in either wound area which is the original venous wound on the right lateral lower leg and in the area on the bunion of her left great toe. This still looks somewhat inflamed and tender. We have been using Iodoflex with open much improvement. 3/4; in general both her wounds look better this includes the original venous insufficiency wound on the right lateral lower leg and the area on her tip of her bunion on the left great toe medial aspect of the MTP. Change to Hydrofera Blue last week 3/12; the patient still has a small geographic shaped wound on the right lateral lower leg. We have been using Hydrofera Blue but I changed her to endoform today. She also has the area in the tip of the bunion of the left great toe. We will try Hydrofera Blue here as well 3/19; the small geographic wound on the right lateral lower leg has not filled in. There is some surrounding induration we have noticed this previously. This may be venous inflammation but I wonder about biopsying this if this is not closing up. The area over the left medial first MTP [bunion deformity] requires debridement. This is not closing in. I am not convinced she is offloading adequately 3/26; right lateral lower leg in the setting of severe venous insufficiency and also an area over the first MTP bunion deformity. No debridement is required. We have been using endoform 4/9; right lateral lower  leg wound in the setting of severe venous insufficiency and  also a refractory area over the first MTP bunion deformity. The area on the right lateral lower leg is just about closed there is still a minor open area here. She comes in today with a mirror image area on the left lateral lower leg. She says this started as a scab 2 weeks ago and is gradually morphed into an open wound very similar to what she has on the right leg. She has severe bilateral venous insufficiency with venous hypertension obvious from clinical exam. 4/16; her original wound the right lateral leg wound in the setting of chronic venous hypertension is a small open area but very small. We have been using endoform. Her wound over the left first MTP bunion deformity also appears to be small and closing in. Unfortunately she has a large relative area on the left lateral calf which was new last week. Very adherent debris over this we have been using Iodoflex however that may be contributing to the debris on the wound surface 4/23; the original wound is closed and the area on the left first metatarsal bunion deformity is also almost closed the new area from last week required debridement. She went for her reflux studies that did not take her lower extremity wraps off. This is not helpful. She did have significant reflux in the right common femoral vein. I am not going to press this issue further. She clinically has severe venous hypertension 4/29; the original wound is closed on the right lateral lower calf. The area on the left first metatarsal/bunion deformity has a small slitlike opening that is still not closed. The real problem here is now wound on the left lateral calf which is necrotic and deep. We used Iodoflex on this wound last time. Silver alginate on the left first toe The patient has [place] wraps. We should be able to transition the right leg into compression stockings and were doing this today. 5/7; the original wound remains closed on the right lateral calf. The left first  metatarsal head still is open. Deterioration in the wound which is the new wound from several weeks ago on the left lateral calf. The patient is not aware how this could have happened. We have been using Sorbact starting last week under 3 layer compression 5/14; the right lateral calf is closed the area on the first met head on the left is closed However the new area from 3 weeks ago on the left lateral calf continues to expand. There is raised nodular tissue around the wound necrotic debris on the surface. Circumference of the wound also looks necrotic. It looks as though there is involvement of the tissue under the skin around the large area of this wound which looks threatened. She is complaining of pain. Culture of this wound I did last week showed rare coag negative staph variant I have never heard of although I am doubtful this has clinical significance 05/09/20-Patient returns with a left lateral calf wound looking about the same, the biopsies reviewed show no atypical features and consistent with trauma or pressure injury or stasis change, patient has appointment to be scheduled with vascular We are using silver alginate and 3 layer compression Patient was started on Trental by dermatology which she has been taking for a week now 6/4; left lateral calf wound. I reviewed the dermatology notes with Dr. Roderic Scarce from [facility] : [facility] dermatology. See suggested the possibility of livedoid vasculopathy possibility of additional biopsies which I  think would have to be punch biopsies. Put her on pentoxifylline noteworthy that she is on Eliquis as well. We have been using silver alginate under compression. She has an appointment for repeat vascular studies on 7/6. This gets back to the fact that they did not take the wrap off on the original studies that were done. I do not believe she has an arterial issue. 6/10; left lateral calf wound. I put her on Hydrofera Blue last week. Some improvement in the surface  condition of the wound. She has repeat vascular studies/reflux studies on 7/6. We are trying to get Apligraf through [place] [employer]. Her husband states he looked on the website this is not going to be approved. I am really not sure if this is a "class effect" 6/18; somewhat surprisingly Apligraf was approved and 100% covered. Her husband was also quite shocked by this. Nevertheless we have her repeat vascular studies on 7/6 and I will not be able to apply this until this is done. We are using Hydrofera Blue to the wound on the left. Her original wound on the right lower leg has maintain closure using compression stocking 6/25; repeat vascular studies/venous on 7/6; we will apply the Apligraf I think that week. We are also going to work around a week's vacation they have the first week in August. 7/2; reflux studies on 7/6;. Likely place first Apligraf from the major wound area on 7/9; 7/9; patient was kindly seen by Dr. Donnetta Hutching of vein and vascular to review her circulation status with the refractory wounds in the left leg. At first wounds in this clinic were on the right leg and they heal she now has a juxta lite stocking. Dr. Donnetta Hutching thought she had venous stasis disease with venous ulcerations. He did not feel that there was anything that would suggest to expect approval with a blush ablation of her saphenous vein the saphenous veins were felt to be relatively small in caliber. She was not felt to have an arterial issue Apligraf #1 apply today in the standard fashion Electronic Signature(s) Signed: 06/24/2020 8:12:39 AM By: Linton Ham MD Entered By: Linton Ham on 06/20/2020 09:15:44 -------------------------------------------------------------------------------- Physical Exam Details Patient Name: Date of Service: Robby Green. 06/20/2020 8:15 A M Medical Record Number: [id number] Patient Account Number: [id number] Date of Birth/Sex: Treating RN: 1933-05-26 (86 y.o. [name] Primary Care Provider: Patrina Levering Other Clinician: Referring Provider: Treating Provider/Extender: [name] in Treatment: 46 Constitutional Patient is hypertensive.. Pulse regular and within target range for patient.Marland Kitchen Respirations regular, non-labored and within target range.. Temperature is normal and within the target range for the patient.Marland Kitchen Appears in no distress. Notes Wound exam; left lateral calf the major wounds continues to have a better looking surface but the same surface area. Apligraf #1 the tributaries in the linear areas of breakdown I also made an attempt to apply Apligraf to these areas. In looking at the wound site continue to think these areas are under more venous pressure then we can easily appreciate. Electronic Signature(s) Signed: 06/24/2020 8:12:39 AM By: Linton Ham MD Entered By: Linton Ham on 06/20/2020 09:13:58 -------------------------------------------------------------------------------- Physician Orders Details Patient Name: Date of Service: Robby Green. 06/20/2020 8:15 A M Medical Record Number: [id number] Patient Account Number: [id number] Date of Birth/Sex: Treating RN: 1933/04/15 (86 y.o. [name] Primary Care Provider: Patrina Levering Other Clinician: Referring Provider: Treating Provider/Extender: [name] in Treatment: 37 Verbal /  Phone Orders: No Diagnosis Coding ICD-10 Coding Code Description I87.321 Chronic venous hypertension (idiopathic) with inflammation of right lower extremity L97.828 Non-pressure chronic ulcer of other part of left lower leg with other specified severity I87.322 Chronic venous hypertension (idiopathic) with inflammation of left lower extremity L97.522 Non-pressure chronic ulcer of other part of left foot with fat layer exposed Follow-up Appointments ppointment in 2 weeks. - MD visit for 2nd apligraft Return A Nurse  Visit: - next week Dressing Change Frequency Wound #5 Left,Lateral Lower Leg Do not change entire dressing for one week. Skin Barriers/Peri-Wound Care Moisturizing lotion - patient to lotion right leg every night. TCA Cream or Ointment - mixed with lotion to left leg Wound Cleansing May shower with protection. - cast protector Primary Wound Dressing Wound #5 Left,Lateral Lower Leg pplication - Apligraft #1 Skin Substitute A Wound #7 Left,Distal,Lateral Lower Leg pplication - Apligraft #1 Skin Substitute A Secondary Dressing Wound #5 Left,Lateral Lower Leg daptic Dressing - with steri-strips A ABD pad Wound #7 Left,Distal,Lateral Lower Leg daptic Dressing - with steri-strips A ABD pad Edema Control 4 layer compression: Left lower extremity Avoid standing for long periods of time Elevate legs to the level of the heart or above for 30 minutes daily and/or when sitting, a frequency of: - throughout the day. Exercise regularly Support Garment 20-30 mm/Hg pressure to: - patient to apply farrow wrap 4000 to right leg. Apply in the morning and remove at night. Off-Loading Open toe surgical shoe to: - left Electronic Signature(s) Signed: 06/20/2020 5:57:27 PM By: Kela Millin Signed: 06/24/2020 8:12:39 AM By: Linton Ham MD Entered By: Kela Millin on 06/20/2020 16:10:96 -------------------------------------------------------------------------------- Problem List Details Patient Name: Date of Service: Robby Green. 06/20/2020 8:15 A M Medical Record Number: [id number] Patient Account Number: [id number] Date of Birth/Sex: Treating RN: Apr 06, 1933 (86 y.o. 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 06/20/2020 8:15 A M Medical Record Number: [id number] Patient Account Number: [id number] Date of Birth/Sex: Treating RN: Aug 04, 1933 (86 y.o. [name] Primary Care Provider: Patrina Levering Other Clinician: Referring Provider: Treating Provider/Extender: Evelena Green in Treatment: 26 Subjective History of Present Illness (HPI) ADMISSION 12/20/2019 This is an 86 year old woman referred by her primary physician Dr. Felipa Eth for review of a wound on her right lateral lower leg. She was actually in this clinic on 2 separate occasions in 2010 and 2012 cared for by Dr. Sherilyn Cooter. At that point in time she had wounds on her right leg as well. She tells [place] to 1 month ago she noticed a scab building up on her right lateral lower leg this opened into  a wound. There was no overt cause of this no trauma, no infection that she is aware of. She has a history of chronic venous insufficiency and wears compression stockings fairly religiously indeed she is done well over the last 8 years since she was last in this clinic. She has  been applying Vaseline on this and a covering phone. This is not progressing towards healing. Past medical history; interstitial lung disease, chronic atrial fibrillation status post pacemaker, osteoarthritis of the left knee, left breast CA, chronic repeat venous insufficiency. She takes Eliquis for her atrial fibrillation stroke prophylaxis. ABI in our clinic was 0.99 on the right 1/15; superficial wound on the right lateral calf in the setting of severe acute skin changes from chronic venous insufficiency and lymphedema. We used Iodoflex last week she complained of a lot of pain 1/22; this is a small but difficult wound on the right lateral calf in the setting of severe chronic venous insufficiency and secondary lymphedema. She continues to state the wounds things hurts when she is up on it but seems to be relieved by putting her leg up. I changed her to [facility] last week because the Iodoflex seem to be causing stinging. 1/29. This wound appears to be contracting somewhat. Changes of chronic venous insufficiency with secondary lymphedema 2/12; not as good as surface today and slightly bigger. I had to change her from Iodoflex to [facility] because of the stinging pain although she does not think it was any better on the [facility]. ooShe comes into clinic today with a area on the medial left great toe. She said she noticed blood on her sock last Saturday. She had some form of bunion surgery by Dr. Ila Mcgill her podiatrist sometime in 2019 she said he "shave the area". I could not find his operative report although I did see reference to the bunion in the area. 2/19; somewhat improved wound on the right lateral lower leg however the wound she came in on the bunion of her left great toe is actually I think larger still somewhat inflamed. We have been using Iodoflex 2/26; not much improvement in either wound area which is the original venous wound on the right lateral lower leg  and in the area on the bunion of her left great toe. This still looks somewhat inflamed and tender. We have been using Iodoflex with open much improvement. 3/4; in general both her wounds look better this includes the original venous insufficiency wound on the right lateral lower leg and the area on her tip of her bunion on the left great toe medial aspect of the MTP. Change to Hydrofera Blue last week 3/12; the patient still has a small geographic shaped wound on the right lateral lower leg. We have been using Hydrofera Blue but I changed her to endoform today. She also has the area in the tip of the bunion of the left great toe. We will try Hydrofera Blue here as well 3/19; the small geographic wound on the right lateral lower leg has not filled in. There is some surrounding induration we have noticed this previously. This may be venous inflammation but I wonder about biopsying this if this is not closing up. The area over the left medial first MTP [bunion deformity] requires debridement. This is not closing in. I am not convinced she is offloading adequately 3/26; right lateral lower leg in the setting of severe venous insufficiency and also an area over the first MTP bunion deformity. No  debridement is required. We have been using endoform 4/9; right lateral lower leg wound in the setting of severe venous insufficiency and also a refractory area over the first MTP bunion deformity. The area on the right lateral lower leg is just about closed there is still a minor open area here. She comes in today with a mirror image area on the left lateral lower leg. She says this started as a scab 2 weeks ago and is gradually morphed into an open wound very similar to what she has on the right leg. She has severe bilateral venous insufficiency with venous hypertension obvious from clinical exam. 4/16; her original wound the right lateral leg wound in the setting of chronic venous hypertension is a small open  area but very small. We have been using endoform. Her wound over the left first MTP bunion deformity also appears to be small and closing in. Unfortunately she has a large relative area on the left lateral calf which was new last week. Very adherent debris over this we have been using Iodoflex however that may be contributing to the debris on the wound surface 4/23; the original wound is closed and the area on the left first metatarsal bunion deformity is also almost closed the new area from last week required debridement. She went for her reflux studies that did not take her lower extremity wraps off. This is not helpful. She did have significant reflux in the right common femoral vein. I am not going to press this issue further. She clinically has severe venous hypertension 4/29; the original wound is closed on the right lateral lower calf. The area on the left first metatarsal/bunion deformity has a small slitlike opening that is still not closed. The real problem here is now wound on the left lateral calf which is necrotic and deep. We used Iodoflex on this wound last time. Silver alginate on the left first toe The patient has Farrow wraps. We should be able to transition the right leg into compression stockings and were doing this today. 5/7; the original wound remains closed on the right lateral calf. The left first metatarsal head still is open. Deterioration in the wound which is the new wound from several weeks ago on the left lateral calf. The patient is not aware how this could have happened. We have been using Sorbact starting last week under 3 layer compression 5/14; the right lateral calf is closed the area on the first met head on the left is closed However the new area from 3 weeks ago on the left lateral calf continues to expand. There is raised nodular tissue around the wound necrotic debris on the surface. Circumference of the wound also looks necrotic. It looks as though there is  involvement of the tissue under the skin around the large area of this wound which looks threatened. She is complaining of pain. Culture of this wound I did last week showed rare coag negative staph variant I have never heard of although I am doubtful this has clinical significance 05/09/20-Patient returns with a left lateral calf wound looking about the same, the biopsies reviewed show no atypical features and consistent with trauma or pressure injury or stasis change, patient has appointment to be scheduled with vascular We are using silver alginate and 3 layer compression Patient was started on Trental by dermatology which she has been taking for a week now 6/4; left lateral calf wound. I reviewed the dermatology notes with Dr. Martin Majestic from [facility] dermatology. 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 Put her on pentoxifylline noteworthy that she is on Eliquis as well. We have been using silver alginate under compression. She has an appointment for repeat vascular studies on 7/6. This gets back to the fact that they did not take the wrap off on the original studies that were done. I do not believe she has an arterial issue. 6/10; left lateral calf wound. I put her on Hydrofera Blue last week. Some improvement in the surface condition of the wound. She has repeat vascular studies/reflux studies on 7/6. We are trying to get Apligraf through [place] [employer]. Her husband states he looked on the website this is not going to be approved. I am really not sure if this is a "class effect" 6/18; somewhat surprisingly Apligraf was approved and 100% covered. Her husband was also quite shocked by this. Nevertheless we have her repeat vascular studies on 7/6 and I will not be able to apply this until this is done. We are using Hydrofera Blue to the wound on the left. Her original wound on the right lower leg has  maintain closure using compression stocking 6/25; repeat vascular studies/venous on 7/6; we will apply the Apligraf I think that week. We are also going to work around a week's vacation they have the first week in August. 7/2; reflux studies on 7/6;. Likely place first Apligraf from the major wound area on 7/9; 7/9; patient was kindly seen by Dr. Donnetta Hutching of vein and vascular to review her circulation status with the refractory wounds in the left leg. At first wounds in this clinic were on the right leg and they heal she now has a juxta lite stocking. Dr. Donnetta Hutching thought she had venous stasis disease with venous ulcerations. He did not feel that there was anything that would suggest to expect approval with a blush ablation of her saphenous vein the saphenous veins were felt to be relatively small in caliber. Apligraf #1 apply today in the standard fashion Objective Constitutional Patient is hypertensive.. Pulse regular and within target range for patient.Marland Kitchen Respirations regular, non-labored and within target range.. Temperature is normal and within the target range for the patient.Marland Kitchen Appears in no distress. Vitals Time Taken: 8:30 AM, Height: 69 in, Weight: 163 lbs, BMI: 24.1, Temperature: 98.0 F, Pulse: 73 bpm, Respiratory Rate: 14 breaths/min, Blood Pressure: 155/92 mmHg. General Notes: Wound exam; left lateral calf the major wounds continues to have a better looking surface but the same surface area. Apligraf #1 the tributaries in the linear areas of breakdown I also made an attempt to apply Apligraf to these areas. In looking at the wound site continue to think these areas are under more venous pressure then we can easily appreciate. Integumentary (Hair, Skin) Wound #5 status is Open. Original cause of wound was Gradually Appeared. The wound is located on the Left,Lateral Lower Leg. The wound measures 2.8cm length x 2.8cm width x 0.2cm depth; 6.158cm^2 area and 1.232cm^3 volume. There is Fat Layer  (Subcutaneous Tissue) Exposed exposed. There is no tunneling or undermining noted. There is a medium amount of serosanguineous drainage noted. The wound margin is flat and intact. There is medium (34-66%) red granulation within the wound bed. There is a medium (34-66%) amount of necrotic tissue within the wound bed including Adherent Slough. Wound #7 status is Open. Original cause of wound was Gradually Appeared. The wound is located on the Left,Distal,Lateral Lower Leg.  The wound measures 0.8cm length x 1cm width x 0.1cm depth; 0.628cm^2 area and 0.063cm^3 volume. There is Fat Layer (Subcutaneous Tissue) Exposed exposed. There is no tunneling or undermining noted. There is a medium amount of serosanguineous drainage noted. The wound margin is flat and intact. There is small (1-33%) pink granulation within the wound bed. There is a large (67-100%) amount of necrotic tissue within the wound bed including Adherent Slough. Assessment Active Problems ICD-10 Chronic venous hypertension (idiopathic) with inflammation of right lower extremity Non-pressure chronic ulcer of other part of left lower leg with other specified severity Chronic venous hypertension (idiopathic) with inflammation of left lower extremity Non-pressure chronic ulcer of other part of left foot with fat layer exposed Procedures Wound #5 Pre-procedure diagnosis of Wound #5 is a Venous Leg Ulcer located on the Left,Lateral Lower Leg. A skin graft procedure using a bioengineered skin substitute/cellular or tissue based product was performed by [name]., MD with the following instrument(s): Blade, Forceps, and Scissors. Apligraf was applied and secured with Steri-Strips. 34 sq cm of product was utilized and 0 sq cm was wasted. Post Application, adaptic was applied. A Time Out was conducted at 09:00, prior to the start of the procedure. The procedure was tolerated well with a pain level of 0 throughout and a pain level of 0  following the procedure. Post procedure Diagnosis Wound #5: Same as Pre-Procedure . Pre-procedure diagnosis of Wound #5 is a Venous Leg Ulcer located on the Left,Lateral Lower Leg . There was a Four Layer Compression Therapy Procedure by [name], RN. Post procedure Diagnosis Wound #5: Same as Pre-Procedure Wound #7 Pre-procedure diagnosis of Wound #7 is a Venous Leg Ulcer located on the Left,Distal,Lateral Lower Leg. A skin graft procedure using a bioengineered skin substitute/cellular or tissue based product was performed by [name]., MD with the following instrument(s): Blade, Forceps, and Scissors. Apligraf was applied and secured with Steri-Strips. 10 sq cm of product was utilized and 0 sq cm was wasted. Post Application, adaptic was applied. A Time Out was conducted at 09:00, prior to the start of the procedure. The procedure was tolerated well with a pain level of 0 throughout and a pain level of 0 following the procedure. Post procedure Diagnosis Wound #7: Same as Pre-Procedure . Pre-procedure diagnosis of Wound #7 is a Venous Leg Ulcer located on the Left,Distal,Lateral Lower Leg . There was a Four Layer Compression Therapy Procedure by [name], RN. Post procedure Diagnosis Wound #7: Same as Pre-Procedure Plan Follow-up Appointments: Return Appointment in 2 weeks. - MD visit for 2nd apligraft Nurse Visit: - next week Dressing Change Frequency: Wound #5 Left,Lateral Lower Leg: Do not change entire dressing for one week. Skin Barriers/Peri-Wound Care: Moisturizing lotion - patient to lotion right leg every night. TCA Cream or Ointment - mixed with lotion to left leg Wound Cleansing: May shower with protection. - cast protector Primary Wound Dressing: Wound #5 Left,Lateral Lower Leg: Skin Substitute Application - Apligraft #1 Wound #7 Left,Distal,Lateral Lower Leg: Skin Substitute Application - Apligraft #1 Secondary Dressing: Wound #5 Left,Lateral  Lower Leg: Adaptic Dressing - with steri-strips ABD pad Wound #7 Left,Distal,Lateral Lower Leg: Adaptic Dressing - with steri-strips ABD pad Edema Control: 4 layer compression: Left lower extremity Avoid standing for long periods of time Elevate legs to the level of the heart or above for 30 minutes daily and/or when sitting, a frequency of: - throughout the day. Exercise regularly Support Garment 20-30 mm/Hg pressure to: - patient to  apply farrow wrap 4000 to right leg. Apply in the morning and remove at night. Off-Loading: Open toe surgical shoe to: - left 1. Apligraf #1 a standard fashion 2. I have increased her from 3-4 layer compression. The thought here is that there is more venous hypertension surrounding this wound and might easily be appreciated. It was not felt she had a significant arterial issue when she saw Dr. Hedy Camara Electronic Signature(s) Signed: 06/24/2020 8:12:39 AM By: Linton Ham MD Entered By: Linton Ham on 06/20/2020 09:14:48 -------------------------------------------------------------------------------- SuperBill Details Patient Name: Date of Service: Robby Green 06/20/2020 Medical Record Number: [id number] Patient Account Number: [id number] Date of Birth/Sex: Treating RN: Oct 13, 1933 (86 y.o. 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 with other specified severity I87.322 Chronic venous hypertension (idiopathic) with inflammation of left lower extremity L97.522 Non-pressure chronic ulcer of other part of left foot with fat layer exposed Facility Procedures The patient participates with Medicare or their  insurance follows the Medicare Facility Guidelines: CPT4 Code Description Modifier Quantity [id number] (Facility Use Only) Apligraf 1 SQ CM 44 The patient participates with Medicare or their insurance follows the Medicare Facility Guidelines: [id number] - SKIN SUB GRAFT TRNK/ARM/LEG 1 ICD-10 Diagnosis Description L97.828 Non-pressure chronic ulcer of other part of left lower leg with other  specified severity I87.322 Chronic venous hypertension (idiopathic) with inflammation of left lower extremity Physician Procedures : CPT4 Code Description Modifier [id number] - WC PHYS SKIN SUB GRAFT TRNK/ARM/LEG ICD-10 Diagnosis Description L97.828 Non-pressure chronic ulcer of other part of left lower leg with other specified severity I87.322 Chronic venous hypertension  (idiopathic) with inflammation of left lower extremity Quantity: 1 Electronic Signature(s) Signed: 06/24/2020 8:12:39 AM By: Linton Ham MD Entered By: Linton Ham on 06/20/2020 09:16:08

## 2020-06-27 ENCOUNTER — Encounter (HOSPITAL_BASED_OUTPATIENT_CLINIC_OR_DEPARTMENT_OTHER): Payer: Medicare Other | Admitting: Internal Medicine

## 2020-06-27 DIAGNOSIS — I87323 Chronic venous hypertension (idiopathic) with inflammation of bilateral lower extremity: Secondary | ICD-10-CM | POA: Diagnosis not present

## 2020-06-27 NOTE — Progress Notes (Signed)
Tracy, Green ([id number]) Visit Report for 06/27/2020 Arrival Information Details Patient Name: Date of Service: [place], [name] 06/27/2020 8:15 A M Medical Record Number: [id number] Patient Account Number: [id number] Date of Birth/Sex: Treating RN: Mar 25, 1933 (86 y.o. [name] Primary Care Zylie Mumaw: [name] Other Clinician: Referring Garnet Overfield: Treating Laporcha Marchesi/Extender: Evelena Peat in Treatment: 27 Visit Information History Since Last Visit Has Dressing in Place as Prescribed: Yes Patient Arrived: Ambulatory Has Compression in Place as Prescribed: Yes Arrival Time: 08:19 Pain Present Now: No Accompanied By: self Transfer Assistance: None Patient Identification Verified: Yes Secondary Verification Process Completed: Yes Patient Requires Transmission-Based Precautions: No Patient Has Alerts: Yes Patient Alerts: Patient on Blood Thinner Right ABI:0.99 Electronic Signature(s) Signed: 06/27/2020 4:43:21 PM By: Kela Millin Entered By: Kela Millin on 06/27/2020 08:19:36 -------------------------------------------------------------------------------- Compression Therapy Details Patient Name: Date of Service: [name]. 06/27/2020 8:15 A M Medical Record Number: [id number] Patient Account Number: [id number] Date of Birth/Sex: Treating RN: May 25, 1933 (86 y.o. [name] Primary Care Semaja Lymon: [name] Other Clinician: Referring Rainie Crenshaw: Treating Madaline Lefeber/Extender: [name] in Treatment: 27 Compression Therapy Performed for Wound Assessment: Wound #5 Left,Lateral Lower Leg Performed By: Clinician [name], RN Compression Type: Four Layer Electronic Signature(s) Signed: 06/27/2020 4:43:21 PM By: Kela Millin Entered By: Kela Millin on 06/27/2020 08:20:34 -------------------------------------------------------------------------------- Compression  Therapy Details Patient Name: Date of Service: [name]. 06/27/2020 8:15 A M Medical Record Number: [id number] Patient Account Number: [id number] Date of Birth/Sex: Treating RN: 03/25/1933 (86 y.o. [name] Primary Care [name]: [name] Other Clinician: Referring Kimberleigh Mehan: Treating Curtiss Mahmood/Extender: [name] in Treatment: 27 Compression Therapy Performed for Wound Assessment: Wound #7 Left,Distal,Lateral Lower Leg Performed By: Clinician [name], RN Compression Type: Four Layer Electronic Signature(s) Signed: 06/27/2020 4:43:21 PM By: Kela Millin Entered By: Kela Millin on 06/27/2020 08:20:34 -------------------------------------------------------------------------------- Encounter Discharge Information Details Patient Name: Date of Service: [name]. 06/27/2020 8:15 A M Medical Record Number: [id number] Patient Account Number: [id number] Date of Birth/Sex: Treating RN: 1933/01/26 (86 y.o. [name] Primary Care [name]: [name] Other Clinician: Referring Jacinto Keil: Treating Shanti Agresti/Extender: Evelena Peat in Treatment: 27 Encounter Discharge Information Items Discharge Condition: Stable Ambulatory Status: Ambulatory Discharge Destination: Home Transportation: Private Auto Accompanied By: self Schedule Follow-up Appointment: Yes Clinical Summary of Care: Patient Declined Electronic Signature(s) Signed: 06/27/2020 4:43:21 PM By: Kela Millin Entered By: Kela Millin on 06/27/2020 08:22:04 -------------------------------------------------------------------------------- Patient/Caregiver Education Details Patient Name: Date of Service: [name], [name] 7/16/2021andnbsp8:15 [place] Record Number: [id number] Patient Account Number: [id number] Date of Birth/Gender: Treating RN: 28-Nov-1933 (86 y.o. [name] Primary Care  Physician: [name] Other Clinician: Referring Physician: Treating Physician/Extender: Evelena Peat in Treatment: 22 Education Assessment Education Provided To: Patient Education Topics Provided Pain: Handouts: A Guide to Pain Control Methods: Explain/Verbal Responses: State content correctly Safety: Handouts: Personal Safety Methods: Explain/Verbal Responses: State content correctly Wound/Skin Impairment: Handouts: Caring for Your Ulcer Methods: Explain/Verbal Responses: State content correctly Electronic Signature(s) Signed: 06/27/2020 4:43:21 PM By: Kela Millin Entered By: Kela Millin on 06/27/2020 08:21:45 -------------------------------------------------------------------------------- Wound Assessment Details Patient Name: Date of Service: [name]. 06/27/2020 8:15 A M Medical Record Number: [id number] Patient Account Number: [id number] Date of Birth/Sex: Treating RN: [birth year] (86 y.o. [name] Primary Care [name]: [name] Other Clinician: Referring Oiva Dibari: Treating Chesley Veasey/Extender:  Rudean Hitt, Hal Green Weeks in Treatment: 27 Wound Status Wound Number: 5 Primary Venous Leg Ulcer Etiology: Wound Location: Left, Lateral Lower Leg Wound Open Wounding Event: Gradually Appeared Status: Date Acquired: 03/21/2020 Comorbid Cataracts, Anemia, Arrhythmia, Congestive Heart Failure, Weeks Of Treatment: 14 History: Hypertension, Peripheral Venous Disease, Received Radiation Clustered Wound: No Wound Measurements Length: (cm) 2.8 Width: (cm) 2.8 Depth: (cm) 0.2 Area: (cm) 6.158 Volume: (cm) 1.232 % Reduction in Area: -1124.3% % Reduction in Volume: -2364% Epithelialization: Small (1-33%) Tunneling: No Undermining: No Wound Description Classification: Full Thickness Without Exposed Support Structures Wound Margin: Flat and Intact Exudate Amount: Medium Exudate Type:  Serosanguineous Exudate Color: red, brown Foul Odor After Cleansing: No Slough/Fibrino Yes Wound Bed Granulation Amount: Medium (34-66%) Exposed Structure Granulation Quality: Red Fascia Exposed: No Necrotic Amount: Medium (34-66%) Fat Layer (Subcutaneous Tissue) Exposed: Yes Necrotic Quality: Adherent Slough Tendon Exposed: No Muscle Exposed: No Joint Exposed: No Bone Exposed: No Treatment Notes Wound #5 (Left, Lateral Lower Leg) 1. Cleanse With Wound Cleanser Soap and water 2. Periwound Care Moisturizing lotion 3. Primary Dressing Applied Other primary dressing (specifiy in notes) 4. Secondary Dressing ABD Pad 6. Support Layer Applied 4 layer compression wrap Notes steri-strips and adaptic remained in place over apligraft. outer dressing changed Electronic Signature(s) Signed: 06/27/2020 4:43:21 PM By: Kela Millin Entered By: Kela Millin on 06/27/2020 08:20:07 -------------------------------------------------------------------------------- Wound Assessment Details Patient Name: Date of Service: [name]. 06/27/2020 8:15 A M Medical Record Number: [id number] Patient Account Number: [id number] Date of Birth/Sex: Treating RN: 1933-04-15 (86 y.o. [name] Primary Care Ayanna Gheen: [name] Other Clinician: Referring Calixto Pavel: Treating Shemeika Starzyk/Extender: [name] in Treatment: 27 Wound Status Wound Number: 7 Primary Venous Leg Ulcer Etiology: Wound Location: Left, Distal, Lateral Lower Leg Wound Open Wounding Event: Gradually Appeared Status: Date Acquired: 05/30/2020 Comorbid Cataracts, Anemia, Arrhythmia, Congestive Heart Failure, Weeks Of Treatment: 4 History: Hypertension, Peripheral Venous Disease, Received Radiation Clustered Wound: No Wound Measurements Length: (cm) 0.8 Width: (cm) 1 Depth: (cm) 0.1 Area: (cm) 0.628 Volume: (cm) 0.063 % Reduction in Area: -300% % Reduction in Volume:  -293.7% Epithelialization: Small (1-33%) Tunneling: No Undermining: No Wound Description Classification: Full Thickness Without Exposed Support Structures Wound Margin: Flat and Intact Exudate Amount: Medium Exudate Type: Serosanguineous Exudate Color: red, brown Foul Odor After Cleansing: No Slough/Fibrino Yes Wound Bed Granulation Amount: Small (1-33%) Exposed Structure Granulation Quality: Pink Fascia Exposed: No Necrotic Amount: Large (67-100%) Fat Layer (Subcutaneous Tissue) Exposed: Yes Necrotic Quality: Adherent Slough Tendon Exposed: No Muscle Exposed: No Joint Exposed: No Bone Exposed: No Treatment Notes Wound #7 (Left, Distal, Lateral Lower Leg) 1. Cleanse With Wound Cleanser Soap and water 2. Periwound Care Moisturizing lotion 3. Primary Dressing Applied Other primary dressing (specifiy in notes) 4. Secondary Dressing ABD Pad 6. Support Layer Applied 4 layer compression wrap Notes steri-strips and adaptic remained in place over apligraft. outer dressing changed Electronic Signature(s) Signed: 06/27/2020 4:43:21 PM By: Kela Millin Entered By: Kela Millin on 06/27/2020 08:20:15 -------------------------------------------------------------------------------- Vitals Details Patient Name: Date of Service: [name] 06/27/2020 8:15 A M Medical Record Number: [id number] Patient Account Number: [id number] Date of Birth/Sex: Treating RN: 22-Jun-1933 (86 y.o. [name] Primary Care Justise Ehmann: [name] Other Clinician: Referring Tymika Grilli: Treating Babbette Dalesandro/Extender: Evelena Peat in Treatment: 27 Vital Signs Time Taken: 08:15 Temperature (F): 97.9 Height (in): 69 Pulse (bpm): 66 Weight (lbs): 163 Respiratory Rate (breaths/min): 18 Body Mass Index (BMI): 24.1  Blood Pressure (mmHg): 121/79 Reference Range: 80 - 120 mg / dl Electronic Signature(s) Signed: 06/27/2020 4:43:21 PM By: Kela Millin Entered By: Kela Millin on 06/27/2020 08:19:16

## 2020-06-30 NOTE — Progress Notes (Signed)
[name], [name] ([id number]) Visit Report for 06/27/2020 SuperBill Details Patient Name: Date of Service: [place], [name] 06/27/2020 Medical Record Number: [id number] Patient Account Number: [id number] Date of Birth/Sex: Treating RN: 1933-06-16 (86 y.o. 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

## 2020-07-04 ENCOUNTER — Encounter (HOSPITAL_BASED_OUTPATIENT_CLINIC_OR_DEPARTMENT_OTHER): Payer: Medicare Other | Admitting: Internal Medicine

## 2020-07-04 DIAGNOSIS — I87323 Chronic venous hypertension (idiopathic) with inflammation of bilateral lower extremity: Secondary | ICD-10-CM | POA: Diagnosis not present

## 2020-07-04 NOTE — Progress Notes (Signed)
Tracy, Green ([id number]) Visit Report for 07/04/2020 Cellular or Tissue Based Product Details Patient Name: Date of Service: [place], [name] 07/04/2020 8:30 A M Medical Record Number: [id number] Patient Account Number: [id number] Date of Birth/Sex: Treating RN: [name] (86 y.o. [name] Primary Care Provider: Lajean Green T Other Clinician: Referring Provider: Treating Provider/Extender: [name] in Treatment: 28 Cellular or Tissue Based Product Type Wound #5 Left,Lateral Lower Leg Applied to: Performed By: Physician [name]., MD Cellular or Tissue Based Product Type: Apligraf Level of Consciousness (Pre-procedure): Awake and Alert Pre-procedure Verification/Time Out Yes - 09:25 Taken: Location: trunk / arms / legs Wound Size (sq cm): 9.9 Product Size (sq cm): 44 Waste Size (sq cm): 10 Waste Reason: size of wound Amount of Product Applied (sq cm): 34 Instrument Used: Blade, Forceps, Scissors Lot #: [id number] Order #: 2 Expiration Date: 07/08/2020 Fenestrated: Yes Instrument: Blade Reconstituted: Yes Solution Type: normal saline Solution Amount: 49m Lot #:: [id number] Expiration Date: 01/12/2022 Secured: Yes Secured With: Steri-Strips Dressing Applied: Yes Primary Dressing: adaptic Procedural Pain: 0 Post Procedural Pain: 0 Response to Treatment: Procedure was tolerated well Level of Consciousness (Post- Awake and Alert procedure): Post Procedure Diagnosis Same as Pre-procedure Electronic Signature(s) Signed: 07/04/2020 5:20:32 PM By: RLinton HamMD Entered By: RLinton Hamon 07/04/2020 09:29:51 -------------------------------------------------------------------------------- HPI Details Patient Name: Date of Service: Tracy BankerJ. 07/04/2020 8:30 A M Medical Record Number: [id number] Account Number: [id number] of Birth/Sex: Treating RN: [birth year]Green(86y.o. Tracy SorrelPrimary Care Provider: SPatrina LeveringOther Clinician: Referring Provider: Treating Provider/Extender: REvelena Peatin Treatment: 28 History of Present Illness HPI Description: ADMISSION 12/20/2019 This is an 86year old woman referred by her primary physician Dr. SFelipa Ethfor review of a wound on her right lateral lower leg. She was actually in this clinic on 2 separate occasions in 2010 and 2012 cared for by Dr. SSherilyn Green At that point in time she had wounds on her right leg as well. She [place] ago she noticed a scab building up on her right lateral lower leg this opened into a wound. There was no overt cause of this no trauma, no infection that she is aware of. She has a history of chronic venous insufficiency and wears compression stockings fairly religiously indeed she is done well over the last 8 years since she was last in this clinic. She has been applying Vaseline on this and a covering phone. This is not progressing towards healing. Past medical history; interstitial lung disease, chronic atrial fibrillation status post pacemaker, osteoarthritis of the left knee, left breast CA, chronic repeat venous insufficiency. She takes Eliquis for her atrial fibrillation stroke prophylaxis. ABI in our clinic was 0.99 on the right 1/15; superficial wound on the right lateral calf in the setting of severe acute skin changes from chronic venous insufficiency and lymphedema. We used Iodoflex last week she complained of a lot of pain 1/22; this is a small but difficult wound on the right lateral calf in the setting of severe chronic venous insufficiency and secondary lymphedema. She continues to state the wounds things hurts when she is up on it but seems to be relieved by putting her leg up. [facility] because the Iodoflex seem to be causing stinging. 1/29. This wound appears to be contracting somewhat. Changes of chronic  venous insufficiency with secondary lymphedema 2/12; not as  good as surface today and slightly bigger. I had to change her from Iodoflex to [name] because of the stinging pain although she does not think it was any better on the [name]. She comes into clinic today with a area on the medial left great toe. She said she noticed blood on her sock last Saturday. She had some form of bunion surgery by Dr. Ila Green her podiatrist sometime in 2019 she said he "shave the area". I could not find his operative report although I did see reference to the bunion in the area. 2/19; somewhat improved wound on the right lateral lower leg however the wound she came in on the bunion of her left great toe is actually I think larger still somewhat inflamed. We have been using Iodoflex 2/26; not much improvement in either wound area which is the original venous wound on the right lateral lower leg and in the area on the bunion of her left great toe. This still looks somewhat inflamed and tender. We have been using Iodoflex with open much improvement. 3/4; in general both her wounds look better this includes the original venous insufficiency wound on the right lateral lower leg and the area on her tip of her bunion on the left great toe medial aspect of the MTP. Change to [name] last week 3/12; the patient still has a small geographic shaped wound on the right lateral lower leg. We have been using [name] but I changed her to endoform today. She also has the area in the tip of the bunion of the left great toe. We will try [name] here as well 3/19; the small geographic wound on the right lateral lower leg has not filled in. There is some surrounding induration we have noticed this previously. This may be venous inflammation but I wonder about biopsying this if this is not closing up. The area over the left medial first MTP [bunion deformity] requires debridement. This is not  closing in. I am not convinced she is offloading adequately 3/26; right lateral lower leg in the setting of severe venous insufficiency and also an area over the first MTP bunion deformity. No debridement is required. We have been using endoform 4/9; right lateral lower leg wound in the setting of severe venous insufficiency and also a refractory area over the first MTP bunion deformity. The area on the right lateral lower leg is just about closed there is still a minor open area here. She comes in today with a mirror image area on the left lateral lower leg. She says this started as a scab 2 weeks ago and is gradually morphed into an open wound very similar to what she has on the right leg. She has severe bilateral venous insufficiency with venous hypertension obvious from clinical exam. 4/16; her original wound the right lateral leg wound in the setting of chronic venous hypertension is a small open area but very small. We have been using endoform. Her wound over the left first MTP bunion deformity also appears to be small and closing in. Unfortunately she has a large relative area on the left lateral calf which was new last week. Very adherent debris over this we have been using Iodoflex however that may be contributing to the debris on the wound surface 4/23; the original wound is closed and the area on the left first metatarsal bunion deformity is also almost closed the new area from last week required debridement. She went for her reflux  studies that did not take her lower extremity wraps off. This is not helpful. She did have significant reflux in the right common femoral vein. I am not going to press this issue further. She clinically has severe venous hypertension 4/29; the original wound is closed on the right lateral lower calf. The area on the left first metatarsal/bunion deformity has a small slitlike opening that is still not closed. The real problem here is now wound on the left  lateral calf which is necrotic and deep. We used Iodoflex on this wound last time. Silver alginate on the left first toe The patient has Farrow wraps. We should be able to transition the right leg into compression stockings and were doing this today. 5/7; the original wound remains closed on the right lateral calf. The left first metatarsal head still is open. Deterioration in the wound which is the new wound from several weeks ago on the left lateral calf. The patient is not aware how this could have happened. We have been using Sorbact starting last week under 3 layer compression 5/14; the right lateral calf is closed the area on the first met head on the left is closed However the new area from 3 weeks ago on the left lateral calf continues to expand. There is raised nodular tissue around the wound necrotic debris on the surface. Circumference of the wound also looks necrotic. It looks as though there is involvement of the tissue under the skin around the large area of this wound which looks threatened. She is complaining of pain. Culture of this wound I did last week showed rare coag negative staph variant I have never heard of although I am doubtful this has clinical significance 05/09/20-Patient returns with a left lateral calf wound looking about the same, the biopsies reviewed show no atypical features and consistent with trauma or pressure injury or stasis change, patient has appointment to be scheduled with vascular We are using silver alginate and 3 layer compression Patient was started on Trental by dermatology which she has been taking for a week now 6/4; left lateral calf wound. I reviewed the dermatology notes with Dr. Martin Majestic from [facility] dermatology. 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 Put her on pentoxifylline noteworthy that she is on Eliquis as well. We have been using silver  alginate under compression. She has an appointment for repeat vascular studies on 7/6. This gets back to the fact that they did not take the wrap off on the original studies that were done. I do not believe she has an arterial issue. 6/10; left lateral calf wound. I put her on [name] last week. Some improvement in the surface condition of the wound. She has repeat vascular studies/reflux studies on 7/6. We are trying to get Apligraf through [place] [employer]. Her husband states he looked on the website this is not going to be approved. I am really not sure if this is a "class effect" 6/18; somewhat surprisingly Apligraf was approved and 100% covered. Her husband was also quite shocked by this. Nevertheless we have her repeat vascular studies on 7/6 and I will not be able to apply this until this is done. We are using [name] to the wound on the left. Her original wound on the right lower leg has maintain closure using compression stocking 6/25; repeat vascular studies/venous on 7/6; we will apply the Apligraf I think that week. We are also  going to work around a [employer] vacation they have the first week in August. 7/2; reflux studies on 7/6;. Likely place first Apligraf from the major wound area on 7/9; 7/9; patient was kindly seen by Dr. Donnetta Hutching of vein and vascular to review her circulation status with the refractory wounds in the left leg. At first wounds in this clinic were on the right leg and they heal she now has a juxta lite stocking. Dr. Donnetta Hutching thought she had venous stasis disease with venous ulcerations. He did not feel that there was anything that would suggest to expect approval with a blush ablation of her saphenous vein the saphenous veins were felt to be relatively small in caliber. She was not felt to have an arterial issue Apligraf #1 apply today in the standard fashion 7/23; the patient came in today complaining of a lot of pain in the left lower leg where the wound  is. We applied Apligraf 2 weeks ago I also increased her compression from 3-4 layer wondering about the venous hypertension. She arrives today with her original wound that we have been treating with Apligraf looking quite healthy although there is some surface slough. Problematically she has de-epithelialized and developed erythema below the wound towards the lateral malleolus itself. This is somewhat angry. I think this is venous stasis loss of epithelialization. I do not believe this is infection . Apligraf #2 applied to the original wound area Electronic Signature(s) Signed: 07/04/2020 5:20:32 PM By: Tracy Ham MD Entered By: [name] on 07/04/2020 09:31:33 -------------------------------------------------------------------------------- Physical Exam Details Patient Name: Date of Service: [name]. 07/04/2020 8:30 A M Medical Record Number: [id number] Patient Account Number: [id number] Date of Birth/Sex: Treating RN: [birth year] (86 y.o. [name] Primary Care Provider: Patrina Green Other Clinician: Referring Provider: Treating Provider/Extender: [name] in Treatment: 28 Constitutional Sitting or standing Blood Pressure is within target range for patient.. Pulse regular and within target range for patient.Marland Kitchen Respirations regular, non-labored and within target range.. Temperature is normal and within the target range for the patient.Marland Kitchen Appears in no distress. Notes Wound exam; left lateral calf. The major wound really look quite good which was the area that we applied Apligraf to 2 weeks ago. HOWEVER below this she has an area of the epithelialization and additional inflammation. She had significant venous stasis/dermatosclerosis before this with some canal-like areas in it however this is all broken down with loss of epithelium and inflammation. I do not really believe this is cellulitis Electronic Signature(s) Signed:  07/04/2020 5:20:32 PM By: Tracy Ham MD Entered By: [name] on 07/04/2020 09:32:45 -------------------------------------------------------------------------------- Physician Orders Details Patient Name: Date of Service: [name]. 07/04/2020 8:30 A M Medical Record Number: [id number] Patient Account Number: [id number] Date of Birth/Sex: Treating RN: [birth year] (86 y.o. [name] Primary Care Provider: Patrina Green Other Clinician: Referring Provider: Treating Provider/Extender: [name] in Treatment: 83 Verbal / Phone Orders: No Diagnosis Coding Follow-up Appointments ppointment in 2 weeks. - MD visit for 2nd apligraft Return A Nurse Visit: - next week Dressing Change Frequency Wound #5 Left,Lateral Lower Leg Do not change entire dressing for one week. Skin Barriers/Peri-Wound Care Barrier cream - to denuded sites Moisturizing lotion - patient to lotion right leg every night. TCA Cream or Ointment - mixed with lotion to left leg Wound Cleansing May shower with protection. - cast protector Primary Wound Dressing Wound #5 Left,Lateral Lower Leg pplication - Apligraft #2  Skin Substitute A Wound #7 Left,Distal,Lateral Lower Leg lginate with Silver - TCA area then silver alginate Calcium A Secondary Dressing Wound #5 Left,Lateral Lower Leg daptic Dressing - with steri-strips A ABD pad Wound #7 Left,Distal,Lateral Lower Leg Dry Gauze ABD pad Edema Control 3 Layer Compression Green - Left Lower Extremity Avoid standing for long periods of time Elevate legs to the level of the heart or above for 30 minutes daily and/or when sitting, a frequency of: - throughout the day. Exercise regularly Support Garment 20-30 mm/Hg pressure to: - patient to apply farrow wrap 4000 to right leg. Apply in the morning and remove at night. Off-Loading Open toe surgical shoe to: - left Patient Medications llergies: codeine,  adhesive, latex, Percodan A Notifications Medication Indication Start End wound infection 07/04/2020 doxycycline monohydrate DOSE oral 100 mg capsule - 1 capsule oral bid for 7 days Electronic Signature(s) Signed: 07/04/2020 9:34:47 AM By: Tracy Ham MD Entered By: [name] on 07/04/2020 09:34:46 -------------------------------------------------------------------------------- Problem List Details Patient Name: Date of Service: [name]. 07/04/2020 8:30 A M Medical Record Number: [id number] Patient Account Number: [id number] Date of Birth/Sex: Treating RN: [birth year] Green (86 y.o. [name] Primary Care Provider: Lajean Green T Other Clinician: Referring Provider: Treating Provider/Extender: [name] in Treatment: 28 Active Problems ICD-10 Encounter Code Description Active Date MDM Diagnosis I87.321 Chronic venous hypertension (idiopathic) with inflammation of right lower 12/20/2019 No Yes extremity L97.828 Non-pressure chronic ulcer of other part of left lower leg with other specified 03/21/2020 No Yes severity I87.322 Chronic venous hypertension (idiopathic) with inflammation of left lower 02/14/2020 No Yes extremity L97.522 Non-pressure chronic ulcer of other part of left foot with fat layer exposed 01/25/2020 No Yes Inactive Problems ICD-10 Code Description Active Date Inactive Date L97.812 Non-pressure chronic ulcer of other part of right lower leg with fat layer exposed 12/20/2019 12/20/2019 Resolved Problems Electronic Signature(s) Signed: 07/04/2020 5:20:32 PM By: Tracy Ham MD Entered By: [name] on 07/04/2020 09:29:30 -------------------------------------------------------------------------------- Progress Note Details Patient Name: Date of Service: [name]. 07/04/2020 8:30 A M Medical Record Number: [id number] Patient Account Number: [id number] Date of Birth/Sex: Treating RN: [birth year]Green (86 y.o. [name] Primary Care Provider: Patrina Green Other Clinician: Referring Provider: Treating Provider/Extender: [name] in Treatment: 28 Subjective History of Present Illness (HPI) ADMISSION 12/20/2019 This is an 86 year old woman referred by her primary physician Dr. Felipa Green for review of a wound on her right lateral lower leg. She was actually in this clinic on 2 separate occasions in 2010 and 2012 cared for by Dr. Sherilyn Green. At that point in time she had wounds on her right leg as well. She tells [place] to 1 month ago she noticed a scab building up on her right lateral lower leg this opened into a wound. There was no overt cause of this no trauma, no infection that she is aware of. She has a history of chronic venous insufficiency and wears compression stockings fairly religiously indeed she is done well over the last 8 years since she was last in this clinic. She has been applying Vaseline on this and a covering phone. This is not progressing towards healing. Past medical history; interstitial lung disease, chronic atrial fibrillation status post pacemaker, osteoarthritis of the left knee, left breast CA, chronic repeat venous insufficiency. She takes Eliquis for her atrial fibrillation stroke prophylaxis. ABI in our clinic was 0.99 on the right 1/15; superficial wound on the right  lateral calf in the setting of severe acute skin changes from chronic venous insufficiency and lymphedema. We used Iodoflex last week she complained of a lot of pain 1/22; this is a small but difficult wound on the right lateral calf in the setting of severe chronic venous insufficiency and secondary lymphedema. She continues to state the wounds things hurts when she is up on it but seems to be relieved by putting her leg up. I changed her to [facility] last week because the Iodoflex seem to be causing stinging. 1/29. This wound appears to be contracting somewhat. Changes  of chronic venous insufficiency with secondary lymphedema 2/12; not as good as surface today and slightly bigger. I had to change her from Iodoflex to [facility] because of the stinging pain although she does not think it was any better on the [facility]. ooShe comes into clinic today with a area on the medial left great toe. She said she noticed blood on her sock last Saturday. She had some form of bunion surgery by Dr. Ila Green her podiatrist sometime in 2019 she said he "shave the area". I could not find his operative report although I did see reference to the bunion in the area. 2/19; somewhat improved wound on the right lateral lower leg however the wound she came in on the bunion of her left great toe is actually I think larger still somewhat inflamed. We have been using Iodoflex 2/26; not much improvement in either wound area which is the original venous wound on the right lateral lower leg and in the area on the bunion of her left great toe. This still looks somewhat inflamed and tender. We have been using Iodoflex with open much improvement. 3/4; in general both her wounds look better this includes the original venous insufficiency wound on the right lateral lower leg and the area on her tip of her bunion on the left great toe medial aspect of the MTP. Change to [name] last week 3/12; the patient still has a small geographic shaped wound on the right lateral lower leg. We have been using [name] but I changed her to endoform today. She also has the area in the tip of the bunion of the left great toe. We will try [name] here as well 3/19; the small geographic wound on the right lateral lower leg has not filled in. There is some surrounding induration we have noticed this previously. This may be venous inflammation but I wonder about biopsying this if this is not closing up. The area over the left medial first MTP [bunion deformity] requires debridement.  This is not closing in. I am not convinced she is offloading adequately 3/26; right lateral lower leg in the setting of severe venous insufficiency and also an area over the first MTP bunion deformity. No debridement is required. We have been using endoform 4/9; right lateral lower leg wound in the setting of severe venous insufficiency and also a refractory area over the first MTP bunion deformity. The area on the right lateral lower leg is just about closed there is still a minor open area here. She comes in today with a mirror image area on the left lateral lower leg. She says this started as a scab 2 weeks ago and is gradually morphed into an open wound very similar to what she has on the right leg. She has severe bilateral venous insufficiency with venous hypertension obvious from clinical exam. 4/16; her original wound the right  lateral leg wound in the setting of chronic venous hypertension is a small open area but very small. We have been using endoform. Her wound over the left first MTP bunion deformity also appears to be small and closing in. Unfortunately she has a large relative area on the left lateral calf which was new last week. Very adherent debris over this we have been using Iodoflex however that may be contributing to the debris on the wound surface 4/23; the original wound is closed and the area on the left first metatarsal bunion deformity is also almost closed the new area from last week required debridement. She went for her reflux studies that did not take her lower extremity wraps off. This is not helpful. She did have significant reflux in the right common femoral vein. I am not going to press this issue further. She clinically has severe venous hypertension 4/29; the original wound is closed on the right lateral lower calf. The area on the left first metatarsal/bunion deformity has a small slitlike opening that is still not closed. The real problem here is now wound on the  left lateral calf which is necrotic and deep. We used Iodoflex on this wound last time. Silver alginate on the left first toe The patient has Farrow wraps. We should be able to transition the right leg into compression stockings and were doing this today. 5/7; the original wound remains closed on the right lateral calf. The left first metatarsal head still is open. Deterioration in the wound which is the new wound from several weeks ago on the left lateral calf. The patient is not aware how this could have happened. We have been using Sorbact starting last week under 3 layer compression 5/14; the right lateral calf is closed the area on the first met head on the left is closed However the new area from 3 weeks ago on the left lateral calf continues to expand. There is raised nodular tissue around the wound necrotic debris on the surface. Circumference of the wound also looks necrotic. It looks as though there is involvement of the tissue under the skin around the large area of this wound which looks threatened. She is complaining of pain. Culture of this wound I did last week showed rare coag negative staph variant I have never heard of although I am doubtful this has clinical significance 05/09/20-Patient returns with a left lateral calf wound looking about the same, the biopsies reviewed show no atypical features and consistent with trauma or pressure injury or stasis change, patient has appointment to be scheduled with vascular We are using silver alginate and 3 layer compression Patient was started on Trental by dermatology which she has been taking for a week now 6/4; left lateral calf wound. I reviewed the dermatology notes with Dr. Martin Majestic from [facility] dermatology. 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 Put her on pentoxifylline noteworthy that she is on Eliquis as well. We have been using  silver alginate under compression. She has an appointment for repeat vascular studies on 7/6. This gets back to the fact that they did not take the wrap off on the original studies that were done. I do not believe she has an arterial issue. 6/10; left lateral calf wound. I put her on [name] last week. Some improvement in the surface condition of the wound. She has repeat vascular studies/reflux studies on 7/6. We are trying to get  Apligraf through [employer]. Her husband states he looked on the website this is not going to be approved. I am really not sure if this is a "class effect" 6/18; somewhat surprisingly Apligraf was approved and 100% covered. Her husband was also quite shocked by this. Nevertheless we have her repeat vascular studies on 7/6 and I will not be able to apply this until this is done. We are using [name] to the wound on the left. Her original wound on the right lower leg has maintain closure using compression stocking 6/25; repeat vascular studies/venous on 7/6; we will apply the Apligraf I think that week. We are also going to work around a week's vacation they have the first week in August. 7/2; reflux studies on 7/6;. Likely place first Apligraf from the major wound area on 7/9; 7/9; patient was kindly seen by Dr. Donnetta Hutching of vein and vascular to review her circulation status with the refractory wounds in the left leg. At first wounds in this clinic were on the right leg and they heal she now has a juxta lite stocking. Dr. Donnetta Hutching thought she had venous stasis disease with venous ulcerations. He did not feel that there was anything that would suggest to expect approval with a blush ablation of her saphenous vein the saphenous veins were felt to be relatively small in caliber. She was not felt to have an arterial issue Apligraf #1 apply today in the standard fashion 7/23; the patient came in today complaining of a lot of pain in the left lower leg where the  wound is. We applied Apligraf 2 weeks ago I also increased her compression from 3-4 layer wondering about the venous hypertension. She arrives today with her original wound that we have been treating with Apligraf looking quite healthy although there is some surface slough. Problematically she has de-epithelialized and developed erythema below the wound towards the lateral malleolus itself. This is somewhat angry. I think this is venous stasis loss of epithelialization. I do not believe this is infection . Apligraf #2 applied to the original wound area Objective Constitutional Sitting or standing Blood Pressure is within target range for patient.. Pulse regular and within target range for patient.Marland Kitchen Respirations regular, non-labored and within target range.. Temperature is normal and within the target range for the patient.Marland Kitchen Appears in no distress. Vitals Time Taken: 8:57 AM, Height: 69 in, Weight: 163 lbs, BMI: 24.1, Temperature: 97.8 F, Pulse: 40 bpm, Respiratory Rate: 18 breaths/min, Blood Pressure: 136/72 mmHg. General Notes: Wound exam; left lateral calf. The major wound really look quite good which was the area that we applied Apligraf to 2 weeks ago. HOWEVER below this she has an area of the epithelialization and additional inflammation. She had significant venous stasis/dermatosclerosis before this with some canal- like areas in it however this is all broken down with loss of epithelium and inflammation. I do not really believe this is cellulitis Integumentary (Hair, Skin) Wound #5 status is Open. Original cause of wound was Gradually Appeared. The wound is located on the Left,Lateral Lower Leg. The wound measures 3.3cm length x 3cm width x 0.1cm depth; 7.775cm^2 area and 0.778cm^3 volume. There is Fat Layer (Subcutaneous Tissue) Exposed exposed. There is no tunneling or undermining noted. There is a medium amount of serosanguineous drainage noted. The wound margin is flat and intact. There  is medium (34-66%) red granulation within the wound bed. There is a medium (34-66%) amount of necrotic tissue within the wound bed including Adherent Slough. Wound #7  status is Open. Original cause of wound was Gradually Appeared. The wound is located on the Left,Distal,Lateral Lower Leg. The wound measures 2cm length x 1cm width x 0.1cm depth; 1.571cm^2 area and 0.157cm^3 volume. There is Fat Layer (Subcutaneous Tissue) Exposed exposed. There is no tunneling or undermining noted. There is a medium amount of serosanguineous drainage noted. The wound margin is flat and intact. There is small (1-33%) pink granulation within the wound bed. There is a large (67-100%) amount of necrotic tissue within the wound bed including Adherent Slough. Assessment Active Problems ICD-10 Chronic venous hypertension (idiopathic) with inflammation of right lower extremity Non-pressure chronic ulcer of other part of left lower leg with other specified severity Chronic venous hypertension (idiopathic) with inflammation of left lower extremity Non-pressure chronic ulcer of other part of left foot with fat layer exposed Procedures Wound #5 Pre-procedure diagnosis of Wound #5 is a Venous Leg Ulcer located on the Left,Lateral Lower Leg. A skin graft procedure using a bioengineered skin substitute/cellular or tissue based product was performed by [name]., MD with the following instrument(s): Blade, Forceps, and Scissors. Apligraf was applied and secured with Steri-Strips. 34 sq cm of product was utilized and 10 sq cm was wasted due to size of wound. Post Application, adaptic was applied. A Time Out was conducted at 09:25, prior to the start of the procedure. The procedure was tolerated well with a pain level of 0 throughout and a pain level of 0 following the procedure. Post procedure Diagnosis Wound #5: Same as Pre-Procedure . Pre-procedure diagnosis of Wound #5 is a Venous Leg Ulcer located on the  Left,Lateral Lower Leg . There was a Three Layer Compression Therapy Procedure by [name], RN. Post procedure Diagnosis Wound #5: Same as Pre-Procedure Wound #7 Pre-procedure diagnosis of Wound #7 is a Venous Leg Ulcer located on the Left,Distal,Lateral Lower Leg . There was a Three Layer Compression Therapy Procedure by [name], RN. Post procedure Diagnosis Wound #7: Same as Pre-Procedure Plan Follow-up Appointments: Return Appointment in 2 weeks. - MD visit for 2nd apligraft Nurse Visit: - next week Dressing Change Frequency: Wound #5 Left,Lateral Lower Leg: Do not change entire dressing for one week. Skin Barriers/Peri-Wound Care: Barrier cream - to denuded sites Moisturizing lotion - patient to lotion right leg every night. TCA Cream or Ointment - mixed with lotion to left leg Wound Cleansing: May shower with protection. - cast protector Primary Wound Dressing: Wound #5 Left,Lateral Lower Leg: Skin Substitute Application - Apligraft #2 Wound #7 Left,Distal,Lateral Lower Leg: Calcium Alginate with Silver - TCA area then silver alginate Secondary Dressing: Wound #5 Left,Lateral Lower Leg: Adaptic Dressing - with steri-strips ABD pad Wound #7 Left,Distal,Lateral Lower Leg: Dry Gauze ABD pad Edema Control: 3 Layer Compression Green - Left Lower Extremity Avoid standing for long periods of time Elevate legs to the level of the heart or above for 30 minutes daily and/or when sitting, a frequency of: - throughout the day. Exercise regularly Support Garment 20-30 mm/Hg pressure to: - patient to apply farrow wrap 4000 to right leg. Apply in the morning and remove at night. Off-Loading: Open toe surgical shoe to: - left The following medication(s) was prescribed: doxycycline monohydrate oral 100 mg capsule 1 capsule oral bid for 7 days for wound infection starting 07/04/2020 #1 T the original wound I reapplied Apligraf #2 in the standard fashion o 2. The area  below this is inflamed angry and de-epithelialized. I really believe that this is likely stasis dermatitis rather  than cellulitis however I use doxycycline predominantly for his anti-inflammatory effects as well as potential antibiotic 3. Triamcinolone silver alginate to the area under the wound which I think is the source of her discomfort this week. 4. I reduce the compression from 4 layer to 3 layer Electronic Signature(s) Signed: 07/04/2020 5:20:32 PM By: Tracy Ham MD Entered By: [name] on 07/04/2020 09:36:14 -------------------------------------------------------------------------------- SuperBill Details Patient Name: Date of Service: [name] 07/04/2020 Medical Record Number: [id number] Patient Account Number: [id number] Date of Birth/Sex: Treating RN: [birth year] (86 y.o. 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 with other specified severity I87.322 Chronic venous hypertension (idiopathic) with inflammation of left lower extremity L97.522 Non-pressure chronic ulcer of other part of left foot with fat layer exposed Facility Procedures The patient participates with Medicare or their insurance follows the Medicare Facility Guidelines: CPT4 Code Description Modifier Quantity [id number] (Facility Use Only) Apligraf 1 SQ CM 44 The patient participates with Medicare or their insurance follows the Medicare Facility Guidelines: [id number] - SKIN SUB GRAFT TRNK/ARM/LEG 1 ICD-10 Diagnosis Description L97.828 Non-pressure chronic ulcer of other part of left lower leg with other  specified severity Physician Procedures : CPT4  Code Description Modifier [id number] - WC PHYS SKIN SUB GRAFT TRNK/ARM/LEG ICD-10 Diagnosis Description L97.828 Non-pressure chronic ulcer of other part of left lower leg with other specified severity Quantity: 1 Electronic Signature(s) Signed: 07/04/2020 5:20:32 PM By: Tracy Ham MD Entered By: [name] on 07/04/2020 09:36:36

## 2020-07-10 ENCOUNTER — Other Ambulatory Visit: Payer: Self-pay

## 2020-07-10 ENCOUNTER — Encounter (HOSPITAL_BASED_OUTPATIENT_CLINIC_OR_DEPARTMENT_OTHER): Payer: Medicare Other | Admitting: Internal Medicine

## 2020-07-10 DIAGNOSIS — I87323 Chronic venous hypertension (idiopathic) with inflammation of bilateral lower extremity: Secondary | ICD-10-CM | POA: Diagnosis not present

## 2020-07-10 NOTE — Progress Notes (Signed)
Tracy, Green ([id number]) Visit Report for 07/10/2020 Arrival Information Details Patient Name: Date of Service: [place], [name] 07/10/2020 9:00 A M Medical Record Number: [id number] Patient Account Number: [id number] Date of Birth/Sex: Treating RN: [birth year] (86 y.o. [name] Primary Care Isa Hitz: Lajean Manes T Other Clinician: Referring Thales Knipple: Treating Josey Dettmann/Extender: Evelena Peat in Treatment: 29 Visit Information History Since Last Visit Added or deleted any medications: No Patient Arrived: Ambulatory Any new allergies or adverse reactions: No Arrival Time: 08:57 Had a fall or experienced change in No Accompanied By: spouse activities of daily living that may affect Transfer Assistance: None risk of falls: Patient Identification Verified: Yes Signs or symptoms of abuse/neglect since last visito No Secondary Verification Process Completed: Yes Hospitalized since last visit: No Patient Requires Transmission-Based Precautions: No Implantable device outside of the clinic excluding No Patient Has Alerts: Yes cellular tissue based products placed in the center Patient Alerts: Patient on Blood Thinner since last visit: Right ABI:0.99 Has Dressing in Place as Prescribed: Yes Has Compression in Place as Prescribed: Yes Pain Present Now: Yes Electronic Signature(s) Signed: 07/10/2020 11:53:13 AM By: [name] RN, BSN Entered By: [name] on 07/10/2020 09:03:34 -------------------------------------------------------------------------------- Compression Therapy Details Patient Name: Date of Service: [name]. 07/10/2020 9:00 A M Medical Record Number: [id number] Patient Account Number: [id number] Date of Birth/Sex: Treating RN: 1932-12-19 (86 y.o. [name] Primary Care Zoran Yankee: Lajean Manes T Other Clinician: Referring Channin Agustin: Treating Jabar Krysiak/Extender: Evelena Peat in  Treatment: 29 Compression Therapy Performed for Wound Assessment: Wound #5 Left,Lateral Lower Leg Performed By: Clinician [name], RN Compression Type: Three Layer Electronic Signature(s) Signed: 07/10/2020 11:53:13 AM By: [name] RN, BSN Entered By: [name] on 07/10/2020 09:27:11 -------------------------------------------------------------------------------- Compression Therapy Details Patient Name: Date of Service: [name]. 07/10/2020 9:00 A M Medical Record Number: [id number] Patient Account Number: [id number] Date of Birth/Sex: Treating RN: Jun 19, 1933 (86 y.o. [name] Primary Care Mahogony Gilchrest: Lajean Manes T Other Clinician: Referring Luby Seamans: Treating Arleen Bar/Extender: [name] in Treatment: 29 Compression Therapy Performed for Wound Assessment: Wound #7 Left,Distal,Lateral Lower Leg Performed By: Clinician [name], RN Compression Type: Three Hydrologist) Signed: 07/10/2020 11:53:13 AM By: [name] RN, BSN Entered By: [name] on 07/10/2020 09:27:11 -------------------------------------------------------------------------------- Encounter Discharge Information Details Patient Name: Date of Service: [name], [name]. 07/10/2020 9:00 A M Medical Record Number: [id number] Patient Account Number: [id number] Date of Birth/Sex: Treating RN: 11-14-1933 (86 y.o. [name] Primary Care Maxten Shuler: Lajean Manes T Other Clinician: Referring Iridian Reader: Treating Carlee Vonderhaar/Extender: Evelena Peat in Treatment: 29 Encounter Discharge Information Items Discharge Condition: Stable Ambulatory Status: Ambulatory Discharge Destination: Home Transportation: Private Auto Accompanied By: self Schedule Follow-up Appointment: Yes Clinical Summary of Care: Patient Declined Electronic Signature(s) Signed: 07/10/2020 11:53:13 AM By: [name] RN,  BSN Entered By: [name] on 07/10/2020 09:30:52 -------------------------------------------------------------------------------- Pain Assessment Details Patient Name: Date of Service: [name]. 07/10/2020 9:00 A M Medical Record Number: [id number] Patient Account Number: [id number] Date of Birth/Sex: Treating RN: 05-Jan-1933 (86 y.o. [name] Primary Care Kayde Atkerson: Lajean Manes T Other Clinician: Referring Arnecia Ector: Treating Garnette Greb/Extender: Evelena Peat in Treatment: 29 Active Problems Location of Pain Severity and Description of Pain Patient Has Paino Yes Site Locations Pain Location: Pain Location: Pain in Ulcers With Dressing Change: Yes Duration of the Pain. Constant / Intermittento Constant Rate  the pain. Current Pain Level: 5 Worst Pain Level: 9 Least Pain Level: 2 Character of Pain Describe the Pain: Aching, Other: sore Pain Management and Medication Current Pain Management: Medication: Yes Is the Current Pain Management Adequate: Adequate How does your wound impact your activities of daily livingo Sleep: Yes Bathing: No Appetite: No Relationship With Others: No Bladder Continence: No Emotions: Yes Bowel Continence: No Work: No Toileting: No Drive: No Dressing: No Hobbies: No Electronic Signature(s) Signed: 07/10/2020 11:53:13 AM By: [name] RN, BSN Entered By: [name] on 07/10/2020 09:04:28 -------------------------------------------------------------------------------- Patient/Caregiver Education Details Patient Name: Date of Service: [name], [name] 7/29/2021andnbsp9:00 A M Medical Record Number: [id number] Patient Account Number: [id number] Date of Birth/Gender: Treating RN: [birth year] (86 y.o. [name] Primary Care Physician: Lajean Manes T Other Clinician: Referring Physician: Treating Physician/Extender: Evelena Peat in Treatment:  29 Education Assessment Education Provided To: Patient Education Topics Provided Pain: Methods: Explain/Verbal Responses: Reinforcements needed, State content correctly Venous: Methods: Explain/Verbal Responses: Reinforcements needed, State content correctly Wound/Skin Impairment: Electronic Signature(s) Signed: 07/10/2020 11:53:13 AM By: [name] RN, BSN Signed: 07/10/2020 11:53:13 AM By: [name] RN, BSN Entered By: [name] on 07/10/2020 09:30:37 -------------------------------------------------------------------------------- Wound Assessment Details Patient Name: Date of Service: [name]. 07/10/2020 9:00 A M Medical Record Number: [id number] Patient Account Number: [id number] Date of Birth/Sex: Treating RN: 28-Mar-1933 (86 y.o. [name] Primary Care Ernesto Lashway: Lajean Manes T Other Clinician: Referring Triton Heidrich: Treating Jaymi Tinner/Extender: [name] in Treatment: 29 Wound Status Wound Number: 5 Primary Venous Leg Ulcer Etiology: Wound Location: Left, Lateral Lower Leg Wound Open Wounding Event: Gradually Appeared Status: Date Acquired: 03/21/2020 Comorbid Cataracts, Anemia, Arrhythmia, Congestive Heart Failure, Weeks Of Treatment: 15 History: Hypertension, Peripheral Venous Disease, Received Radiation Clustered Wound: No Wound Measurements Length: (cm) 3.3 Width: (cm) 3 Depth: (cm) 0.1 Area: (cm) 7.775 Volume: (cm) 0.778 % Reduction in Area: -1445.7% % Reduction in Volume: -1456% Epithelialization: Small (1-33%) Tunneling: No Undermining: No Wound Description Classification: Full Thickness Without Exposed Support Structures Wound Margin: Flat and Intact Exudate Amount: Medium Exudate Type: Serosanguineous Exudate Color: red, brown Foul Odor After Cleansing: No Slough/Fibrino Yes Wound Bed Granulation Amount: Medium (34-66%) Exposed Structure Granulation Quality: Red Fascia Exposed:  No Necrotic Amount: Medium (34-66%) Fat Layer (Subcutaneous Tissue) Exposed: Yes Necrotic Quality: Adherent Slough Tendon Exposed: No Muscle Exposed: No Joint Exposed: No Bone Exposed: No Assessment Notes unable to visualize wound bed, apligraf and adaptic intact Treatment Notes Wound #5 (Left, Lateral Lower Leg) 2. Periwound Care Moisturizing lotion TCA Cream 4. Secondary Dressing Dry Gauze Kerramax/Xtrasorb 6. Support Layer Applied 3 layer compression wrap Notes adaptic over apligraf and secured with steri strips, zetuvit Electronic Signature(s) Signed: 07/10/2020 11:53:13 AM By: [name] RN, BSN Entered By: [name] on 07/10/2020 09:26:35 -------------------------------------------------------------------------------- Wound Assessment Details Patient Name: Date of Service: [name]. 07/10/2020 9:00 A M Medical Record Number: [id number] Patient Account Number: [id number] Date of Birth/Sex: Treating RN: [birth year] (86 y.o. [name] Primary Care Hall Birchard: Lajean Manes T Other Clinician: Referring Jeydan Barner: Treating Caysen Whang/Extender: [name] in Treatment: 29 Wound Status Wound Number: 7 Primary Venous Leg Ulcer Etiology: Wound Location: Left, Distal, Lateral Lower Leg Wound Open Wounding Event: Gradually Appeared Status: Date Acquired: 05/30/2020 Comorbid Cataracts, Anemia, Arrhythmia, Congestive Heart Failure, Weeks Of Treatment: 5 History: Hypertension, Peripheral Venous Disease, Received Radiation Clustered Wound: Yes Wound Measurements Length: (cm) 2 Width: (cm) 1 Depth: (  cm) 0.1 Clustered Quantity: 2 Area: (cm) 1.571 Volume: (cm) 0.157 % Reduction in Area: -900.6% % Reduction in Volume: -881.2% Epithelialization: Small (1-33%) Tunneling: No Undermining: No Wound Description Classification: Full Thickness Without Exposed Support Structures Wound Margin: Flat and Intact Exudate Amount:  Medium Exudate Type: Serosanguineous Exudate Color: red, brown Foul Odor After Cleansing: No Slough/Fibrino Yes Wound Bed Granulation Amount: Small (1-33%) Exposed Structure Granulation Quality: Pink Fascia Exposed: No Necrotic Amount: Large (67-100%) Fat Layer (Subcutaneous Tissue) Exposed: Yes Necrotic Quality: Adherent Slough Tendon Exposed: No Muscle Exposed: No Joint Exposed: No Bone Exposed: No Treatment Notes Wound #7 (Left, Distal, Lateral Lower Leg) 2. Periwound Care Moisturizing lotion TCA Cream 3. Primary Dressing Applied Calcium Alginate Ag 4. Secondary Dressing Dry Gauze Kerramax/Xtrasorb 6. Support Layer Applied 3 layer compression Water quality scientist) Signed: 07/10/2020 11:53:13 AM By: [name] RN, BSN Entered By: [name] on 07/10/2020 09:26:53 -------------------------------------------------------------------------------- Ferney Details Patient Name: Date of Service: [name], Moody Bruins J. 07/10/2020 9:00 A M Medical Record Number: [id number] Patient Account Number: [id number] Date of Birth/Sex: Treating RN: [birth year] (86 y.o. [name] Primary Care Jae Bruck: Lajean Manes T Other Clinician: Referring Hansen Carino: Treating Phelix Fudala/Extender: Evelena Peat in Treatment: 29 Vital Signs Time Taken: 09:04 Temperature (F): 98.1 Height (in): 69 Pulse (bpm): 79 Source: Stated Respiratory Rate (breaths/min): 18 Weight (lbs): 163 Blood Pressure (mmHg): 164/81 Source: Stated Reference Range: 80 - 120 mg / dl Body Mass Index (BMI): 24.1 Electronic Signature(s) Signed: 07/10/2020 11:53:13 AM By: [name] RN, BSN Entered By: [name] on 07/10/2020 09:24:19

## 2020-07-16 NOTE — Progress Notes (Signed)
[name], [name] ([id number]) Visit Report for 07/10/2020 SuperBill Details Patient Name: Date of Service: [name], [name] 07/10/2020 Medical Record Number: [id number] Patient Account Number: [id number] Date of Birth/Sex: Treating RN: [name], [name] (86 y.o. [name] Primary Care Provider: Lajean Manes T Other Clinician: Referring Provider: Treating Provider/Extender: Evelena Peat in Treatment: 29 Diagnosis Coding ICD-10 Codes Code Description I87.321 Chronic venous hypertension (idiopathic) with inflammation of right lower extremity L97.828 Non-pressure chronic ulcer of other part of left lower leg with other specified severity I87.322 Chronic venous hypertension (idiopathic) with inflammation of left lower extremity L97.522 Non-pressure chronic ulcer of other part of left foot with fat layer exposed Facility Procedures The patient participates with Medicare or their insurance follows the Medicare Facility Guidelines CPT4 Code Description Modifier Quantity [id number] (Facility Use Only) [phone] - APPLY [place] 1 Electronic Signature(s) Signed: 07/10/2020 11:53:13 AM By: [name] RN, BSN Signed: 07/16/2020 9:56:59 AM By: Linton Ham MD Entered By: [name] on 07/10/2020 09:31:03

## 2020-07-17 ENCOUNTER — Ambulatory Visit: Payer: Medicare Other | Admitting: Hematology and Oncology

## 2020-07-21 ENCOUNTER — Encounter (HOSPITAL_BASED_OUTPATIENT_CLINIC_OR_DEPARTMENT_OTHER): Payer: Medicare Other | Attending: Internal Medicine | Admitting: Internal Medicine

## 2020-07-21 DIAGNOSIS — L97522 Non-pressure chronic ulcer of other part of left foot with fat layer exposed: Secondary | ICD-10-CM | POA: Diagnosis not present

## 2020-07-21 DIAGNOSIS — I110 Hypertensive heart disease with heart failure: Secondary | ICD-10-CM | POA: Diagnosis not present

## 2020-07-21 DIAGNOSIS — L97828 Non-pressure chronic ulcer of other part of left lower leg with other specified severity: Secondary | ICD-10-CM | POA: Insufficient documentation

## 2020-07-21 DIAGNOSIS — I872 Venous insufficiency (chronic) (peripheral): Secondary | ICD-10-CM | POA: Insufficient documentation

## 2020-07-21 DIAGNOSIS — I482 Chronic atrial fibrillation, unspecified: Secondary | ICD-10-CM | POA: Diagnosis not present

## 2020-07-21 DIAGNOSIS — I509 Heart failure, unspecified: Secondary | ICD-10-CM | POA: Insufficient documentation

## 2020-07-21 DIAGNOSIS — I87303 Chronic venous hypertension (idiopathic) without complications of bilateral lower extremity: Secondary | ICD-10-CM | POA: Insufficient documentation

## 2020-07-21 DIAGNOSIS — I890 Lymphedema, not elsewhere classified: Secondary | ICD-10-CM | POA: Diagnosis not present

## 2020-07-21 DIAGNOSIS — Z950 Presence of cardiac pacemaker: Secondary | ICD-10-CM | POA: Diagnosis not present

## 2020-07-21 NOTE — Progress Notes (Signed)
[name], [name] ([id number]) Visit Report for 07/21/2020 Cellular or Tissue Based Product Details Patient Name: Date of Service: [place], [name] 07/21/2020 8:00 A M Medical Record Number: [id number] Patient Account Number: [id number] Date of Birth/Sex: Treating RN: 1933-11-29 (86 y.o. [name] Primary Care Provider: Patrina Levering Other Clinician: Referring Provider: Treating Provider/Extender: Ida Rogue in Treatment: 30 Cellular or Tissue Based Product Type Wound #5 Left,Lateral Lower Leg Applied to: Performed By: Physician Tobi Bastos, MD Cellular or Tissue Based Product Type: Apligraf Level of Consciousness (Pre-procedure): Awake and Alert Pre-procedure Verification/Time Out Yes - 08:40 Taken: Location: trunk / arms / legs Wound Size (sq cm): 17.5 Product Size (sq cm): 34 Waste Size (sq cm): 0 Amount of Product Applied (sq cm): 34 Lot #: [id number] Order #: 3 Expiration Date: 07/25/2020 Fenestrated: Yes Instrument: Blade Reconstituted: Yes Solution Type: normal saline Solution Amount: 10 ml Lot #: [id number] Solution Expiration Date: 08/12/2021 Secured: Yes Secured With: Steri-Strips Dressing Applied: Yes Primary Dressing: adaptic, gauze, compression wrap Procedural Pain: 0 Post Procedural Pain: 0 Response to Treatment: Procedure was tolerated well Level of Consciousness (Post- Awake and Alert procedure): Post Procedure Diagnosis Same as Pre-procedure Electronic Signature(s) Signed: 07/21/2020 4:24:36 PM By: Tobi Bastos MD, MBA Signed: 07/21/2020 5:19:36 PM By: [name] RN, BSN Entered By: [name] on 07/21/2020 08:43:49 -------------------------------------------------------------------------------- Cellular or Tissue Based Product Details Patient Name: Date of Service: Tracy Sermon. 07/21/2020 8:00 A M Medical Record Number: [id number] Patient Account Number: [id number] Date of Birth/Sex: Treating  RN: 23-Jul-1933 (86 y.o. [name] Primary Care Provider: Patrina Levering Other Clinician: Referring Provider: Treating Provider/Extender: Ida Rogue in Treatment: 30 Cellular or Tissue Based Product Type Wound #7 Left,Distal,Lateral Lower Leg Applied to: Performed By: Physician Tobi Bastos, MD Cellular or Tissue Based Product Type: Apligraf Level of Consciousness (Pre-procedure): Awake and Alert Pre-procedure Verification/Time Out Yes - 08:40 Taken: Location: trunk / arms / legs Wound Size (sq cm): 6.75 Product Size (sq cm): 10 Waste Size (sq cm): 0 Amount of Product Applied (sq cm): 10 Lot #: [id number] Order #: 3 Expiration Date: 07/25/2020 Fenestrated: Yes Instrument: Blade Reconstituted: Yes Solution Type: normal saline Solution Amount: 10 ml Lot #: [id number] Solution Expiration Date: 08/12/2021 Secured: Yes Secured With: Steri-Strips Dressing Applied: Yes Primary Dressing: adaptic, gauze, compression wrap Procedural Pain: 0 Post Procedural Pain: 0 Response to Treatment: Procedure was tolerated well Level of Consciousness (Post- Awake and Alert procedure): Post Procedure Diagnosis Same as Pre-procedure Electronic Signature(s) Signed: 07/21/2020 4:24:36 PM By: Tobi Bastos MD, MBA Signed: 07/21/2020 5:19:36 PM By: [name] RN, BSN Entered By: [name] on 07/21/2020 08:44:05 -------------------------------------------------------------------------------- Debridement Details Patient Name: Date of Service: Tracy Banker J. 07/21/2020 8:00 A M Medical Record Number: [id number] Patient Account Number: [id number] Date of Birth/Sex: Treating RN: 1933/03/27 (86 y.o. [name] Primary Care Provider: Patrina Levering Other Clinician: Referring Provider: Treating Provider/Extender: Gifford Shave T Weeks in Treatment: 30 Debridement Performed for Assessment: Wound #5 Left,Lateral Lower  Leg Performed By: Physician Tobi Bastos, MD Debridement Type: Debridement Severity of Tissue Pre Debridement: Fat layer exposed Level of Consciousness (Pre-procedure): Awake and Alert Pre-procedure Verification/Time Out Yes - 08:33 Taken: Start Time: 08:33 Pain Control: Other : Benzocaine 20% T Area Debrided (L x W): otal 5 (cm) x 3.5 (cm) = 17.5 (cm) Tissue and other material debrided: Viable, Non-Viable, Slough, Subcutaneous, Slough Level: Skin/Subcutaneous Tissue Debridement Description: Excisional Instrument:  Curette Bleeding: Minimum Hemostasis Achieved: Pressure End Time: 08:35 Procedural Pain: 4 Post Procedural Pain: 2 Response to Treatment: Procedure was tolerated well Level of Consciousness (Post- Awake and Alert procedure): Post Debridement Measurements of Total Wound Length: (cm) 5 Width: (cm) 3.5 Depth: (cm) 0.1 Volume: (cm) 1.374 Character of Wound/Ulcer Post Debridement: Improved Severity of Tissue Post Debridement: Fat layer exposed Post Procedure Diagnosis Same as Pre-procedure Electronic Signature(s) Signed: 07/21/2020 4:24:36 PM By: Tobi Bastos MD, MBA Signed: 07/21/2020 5:19:36 PM By: [name] RN, BSN Entered By: [name] on 07/21/2020 08:34:31 -------------------------------------------------------------------------------- Debridement Details Patient Name: Date of Service: Tracy Banker J. 07/21/2020 8:00 A M Medical Record Number: [id number] Patient Account Number: [id number] Date of Birth/Sex: Treating RN: 06-08-1933 (85 y.o. [name] Primary Care Provider: Lajean Manes T Other Clinician: Referring Provider: Treating Provider/Extender: Macon Large Weeks in Treatment: 30 Debridement Performed for Assessment: Wound #7 Left,Distal,Lateral Lower Leg Performed By: Physician Tobi Bastos, MD Debridement Type: Debridement Severity of Tissue Pre Debridement: Fat layer exposed Level of Consciousness  (Pre-procedure): Awake and Alert Pre-procedure Verification/Time Out Yes - 08:33 Taken: Start Time: 08:33 Pain Control: Other : Benzocaine 20% T Area Debrided (L x W): otal 2.5 (cm) x 2.7 (cm) = 6.75 (cm) Tissue and other material debrided: Viable, Non-Viable, Slough, Subcutaneous, Slough Level: Skin/Subcutaneous Tissue Debridement Description: Excisional Instrument: Curette Bleeding: Minimum Hemostasis Achieved: Pressure End Time: 08:35 Procedural Pain: 4 Post Procedural Pain: 2 Response to Treatment: Procedure was tolerated well Level of Consciousness (Post- Awake and Alert procedure): Post Debridement Measurements of Total Wound Length: (cm) 2.5 Width: (cm) 2.7 Depth: (cm) 0.1 Volume: (cm) 0.53 Character of Wound/Ulcer Post Debridement: Improved Severity of Tissue Post Debridement: Fat layer exposed Post Procedure Diagnosis Same as Pre-procedure Electronic Signature(s) Signed: 07/21/2020 4:24:36 PM By: Tobi Bastos MD, MBA Signed: 07/21/2020 5:19:36 PM By: [name] RN, BSN Entered By: [name] on 07/21/2020 08:36:27 -------------------------------------------------------------------------------- HPI Details Patient Name: Date of Service: [name], Tracy Bruins J. 07/21/2020 8:00 A M Medical Record Number: [id number] Patient Account Number: [id number] Date of Birth/Sex: Treating RN: 1933-09-16 (86 y.o. [name] Primary Care Provider: Patrina Levering Other Clinician: Referring Provider: Treating Provider/Extender: Macon Large Weeks in Treatment: 30 History of Present Illness HPI Description: ADMISSION 12/20/2019 This is an 86 year old woman referred by her primary physician Dr. Felipa Eth for review of a wound on her right lateral lower leg. She was actually in this clinic on 2 separate occasions in 2010 and 2012 cared for by Dr. Sherilyn Cooter. At that point in time she had wounds on her right leg as well. She tells [place] to 1 month ago she  noticed a scab building up on her right lateral lower leg this opened into a wound. There was no overt cause of this no trauma, no infection that she is aware of. She has a history of chronic venous insufficiency and wears compression stockings fairly religiously indeed she is done well over the last 8 years since she was last in this clinic. She has been applying Vaseline on this and a covering phone. This is not progressing towards healing. Past medical history; interstitial lung disease, chronic atrial fibrillation status post pacemaker, osteoarthritis of the left knee, left breast CA, chronic repeat venous insufficiency. She takes Eliquis for her atrial fibrillation stroke prophylaxis. ABI in our clinic was 0.99 on the right 1/15; superficial wound on the right lateral calf in the setting of severe acute skin changes from chronic venous  insufficiency and lymphedema. We used Iodoflex last week she complained of a lot of pain 1/22; this is a small but difficult wound on the right lateral calf in the setting of severe chronic venous insufficiency and secondary lymphedema. She continues to state the wounds things hurts when she is up on it but seems to be relieved by putting her leg up. I changed her to [facility] last week because the Iodoflex seem to be causing stinging. 1/29. This wound appears to be contracting somewhat. Changes of chronic venous insufficiency with secondary lymphedema 2/12; not as good as surface today and slightly bigger. I had to change her from Iodoflex to [facility] because of the stinging pain although she does not think it was any better on the [facility]. She comes into clinic today with a area on the medial left great toe. She said she noticed blood on her sock last Saturday. She had some form of bunion surgery by Dr. Cristie Hem her podiatrist sometime in 2019 she said he "shave the area". I could not find his operative report although I did see reference  to the bunion in the area. 2/19; somewhat improved wound on the right lateral lower leg however the wound she came in on the bunion of her left great toe is actually I think larger still somewhat inflamed. We have been using Iodoflex 2/26; not much improvement in either wound area which is the original venous wound on the right lateral lower leg and in the area on the bunion of her left great toe. This still looks somewhat inflamed and tender. We have been using Iodoflex with open much improvement. 3/4; in general both her wounds look better this includes the original venous insufficiency wound on the right lateral lower leg and the area on her tip of her bunion on the left great toe medial aspect of the MTP. Change to Hydrofera Blue last week 3/12; the patient still has a small geographic shaped wound on the right lateral lower leg. We have been using Hydrofera Blue but I changed her to endoform today. She also has the area in the tip of the bunion of the left great toe. We will try Hydrofera Blue here as well 3/19; the small geographic wound on the right lateral lower leg has not filled in. There is some surrounding induration we have noticed this previously. This may be venous inflammation but I wonder about biopsying this if this is not closing up. The area over the left medial first MTP [bunion deformity] requires debridement. This is not closing in. I am not convinced she is offloading adequately 3/26; right lateral lower leg in the setting of severe venous insufficiency and also an area over the first MTP bunion deformity. No debridement is required. We have been using endoform 4/9; right lateral lower leg wound in the setting of severe venous insufficiency and also a refractory area over the first MTP bunion deformity. The area on the right lateral lower leg is just about closed there is still a minor open area here. She comes in today with a mirror image area on the left lateral lower leg.  She says this started as a scab 2 weeks ago and is gradually morphed into an open wound very similar to what she has on the right leg. She has severe bilateral venous insufficiency with venous hypertension obvious from clinical exam. 4/16; her original wound the right lateral leg wound in the setting of chronic venous hypertension is a small  open area but very small. We have been using endoform. Her wound over the left first MTP bunion deformity also appears to be small and closing in. Unfortunately she has a large relative area on the left lateral calf which was Tracy last week. Very adherent debris over this we have been using Iodoflex however that may be contributing to the debris on the wound surface 4/23; the original wound is closed and the area on the left first metatarsal bunion deformity is also almost closed the Tracy area from last week required debridement. She went for her reflux studies that did not take her lower extremity wraps off. This is not helpful. She did have significant reflux in the right common femoral vein. I am not going to press this issue further. She clinically has severe venous hypertension 4/29; the original wound is closed on the right lateral lower calf. The area on the left first metatarsal/bunion deformity has a small slitlike opening that is still not closed. The real problem here is now wound on the left lateral calf which is necrotic and deep. We used Iodoflex on this wound last time. Silver alginate on the left first toe The patient has Farrow wraps. We should be able to transition the right leg into compression stockings and were doing this today. 5/7; the original wound remains closed on the right lateral calf. The left first metatarsal head still is open. Deterioration in the wound which is the Tracy wound from several weeks ago on the left lateral calf. The patient is not aware how this could have happened. We have been using Sorbact starting last week under  3 layer compression 5/14; the right lateral calf is closed the area on the first met head on the left is closed However the Tracy area from 3 weeks ago on the left lateral calf continues to expand. There is raised nodular tissue around the wound necrotic debris on the surface. Circumference of the wound also looks necrotic. It looks as though there is involvement of the tissue under the skin around the large area of this wound which looks threatened. She is complaining of pain. Culture of this wound I did last week showed rare coag negative staph variant I have never heard of although I am doubtful this has clinical significance 05/09/20-Patient returns with a left lateral calf wound looking about the same, the biopsies reviewed show no atypical features and consistent with trauma or pressure injury or stasis change, patient has appointment to be scheduled with vascular We are using silver alginate and 3 layer compression Patient was started on Trental by dermatology which she has been taking for a week now 6/4; left lateral calf wound. I reviewed the dermatology notes with Dr. Roderic Scarce from [facility] dermatology. 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 Put her on pentoxifylline noteworthy that she is on Eliquis as well. We have been using silver alginate under compression. She has an appointment for repeat vascular studies on 7/6. This gets back to the fact that they did not take the wrap off on the original studies that were done. I do not believe she has an arterial issue. 6/10; left lateral calf wound. I put her on Hydrofera Blue last week. Some improvement in the surface condition of the wound. She has repeat vascular studies/reflux studies on 7/6. We are trying to get Apligraf through [place] [employer]. Her husband states he looked on the website this is  not going to be approved. I am really not sure  if this is a "class effect" 6/18; somewhat surprisingly Apligraf was approved and 100% covered. Her husband was also quite shocked by this. Nevertheless we have her repeat vascular studies on 7/6 and I will not be able to apply this until this is done. We are using Hydrofera Blue to the wound on the left. Her original wound on the right lower leg has maintain closure using compression stocking 6/25; repeat vascular studies/venous on 7/6; we will apply the Apligraf I think that week. We are also going to work around a week's vacation they have the first week in August. 7/2; reflux studies on 7/6;. Likely place first Apligraf from the major wound area on 7/9; 7/9; patient was kindly seen by Dr. Donnetta Hutching of vein and vascular to review her circulation status with the refractory wounds in the left leg. At first wounds in this clinic were on the right leg and they heal she now has a juxta lite stocking. Dr. Donnetta Hutching thought she had venous stasis disease with venous ulcerations. He did not feel that there was anything that would suggest to expect approval with a blush ablation of her saphenous vein the saphenous veins were felt to be relatively small in caliber. She was not felt to have an arterial issue Apligraf #1 apply today in the standard fashion 7/23; the patient came in today complaining of a lot of pain in the left lower leg where the wound is. We applied Apligraf 2 weeks ago I also increased her compression from 3-4 layer wondering about the venous hypertension. She arrives today with her original wound that we have been treating with Apligraf looking quite healthy although there is some surface slough. Problematically she has de-epithelialized and developed erythema below the wound towards the lateral malleolus itself. This is somewhat angry. I think this is venous stasis loss of epithelialization. I do not believe this is infection . Apligraf #2 applied to the original wound area 8/9-Patient returns  at 2 weeks for Apligraf #3, the left leg venous leg ulcer area had more slough this time Electronic Signature(s) Signed: 07/21/2020 8:44:58 AM By: Tobi Bastos MD, MBA Entered By: Tobi Bastos on 07/21/2020 08:44:58 -------------------------------------------------------------------------------- Physical Exam Details Patient Name: Date of Service: Tracy Banker J. 07/21/2020 8:00 A M Medical Record Number: [id number] Patient Account Number: [id number] Date of Birth/Sex: Treating RN: Nov 17, 1933 (86 y.o. [name] Primary Care Provider: Patrina Levering Other Clinician: Referring Provider: Treating Provider/Extender: Gifford Shave T Weeks in Treatment: 30 Constitutional alert and oriented x 3. sitting or standing blood pressure is within target range for patient.. supine blood pressure is within target range for patient.. pulse regular and within target range for patient.Marland Kitchen respirations regular, non-labored and within target range for patient.Marland Kitchen temperature within target range for patient.. . . Well- nourished and well-hydrated in no acute distress. Notes Left lateral leg venous ulcer with some slough that was removed with a curette, surrounding skin appears intact Electronic Signature(s) Signed: 07/21/2020 8:45:38 AM By: Tobi Bastos MD, MBA Entered By: Tobi Bastos on 07/21/2020 08:45:37 -------------------------------------------------------------------------------- Physician Orders Details Patient Name: Date of Service: Tracy Banker J. 07/21/2020 8:00 A M Medical Record Number: [id number] Patient Account Number: [id number] Date of Birth/Sex: Treating RN: [birth year] (86 y.o. [name] Primary Care Provider: Patrina Levering Other Clinician: Referring Provider: Treating Provider/Extender: Gifford Shave T Weeks in Treatment: 30 Verbal / Phone Orders: No Diagnosis Coding ICD-10  Coding Code Description I87.321 Chronic venous  hypertension (idiopathic) with inflammation of right lower extremity L97.828 Non-pressure chronic ulcer of other part of left lower leg with other specified severity I87.322 Chronic venous hypertension (idiopathic) with inflammation of left lower extremity L97.522 Non-pressure chronic ulcer of other part of left foot with fat layer exposed Follow-up Appointments ppointment in 2 weeks. - MD visit Return A Nurse Visit: - 1 week Dressing Change Frequency Do not change entire dressing for one week. Skin Barriers/Peri-Wound Care Barrier cream Moisturizing lotion TCA Cream or Ointment - mixed with lotion Wound Cleansing May shower with protection. - cast protector Primary Wound Dressing Wound #5 Left,Lateral Lower Leg pplication - Apligraft #3 Skin Substitute A Wound #7 Left,Distal,Lateral Lower Leg pplication - Apligraft #3 Skin Substitute A Secondary Dressing Wound #5 Left,Lateral Lower Leg daptic Dressing - secure with steri-strips A ABD pad Wound #7 Left,Distal,Lateral Lower Leg daptic Dressing - secure with steri-strips A ABD pad Edema Control 3 Layer Compression System - Left Lower Extremity Avoid standing for long periods of time Elevate legs to the level of the heart or above for 30 minutes daily and/or when sitting, a frequency of: - throughout the day. Exercise regularly Support Garment 20-30 mm/Hg pressure to: - patient to apply farrow wrap 4000 to right leg. Apply in the morning and remove at night. Off-Loading Open toe surgical shoe to: - left foot Electronic Signature(s) Signed: 07/21/2020 4:24:36 PM By: Tobi Bastos MD, MBA Signed: 07/21/2020 5:19:36 PM By: [name] RN, BSN Entered By: [name] on 07/21/2020 08:46:07 -------------------------------------------------------------------------------- Problem List Details Patient Name: Date of Service: [name], [place]. 07/21/2020 8:00 A M Medical Record Number: [id number] Patient Account Number:  [id number] Date of Birth/Sex: Treating RN: November 24, 1933 (86 y.o. [name] Primary Care Provider: Lajean Manes T Other Clinician: Referring Provider: Treating Provider/Extender: Macon Large Weeks in Treatment: 30 Active Problems ICD-10 Encounter Code Description Active Date MDM Diagnosis I87.321 Chronic venous hypertension (idiopathic) with inflammation of right lower 12/20/2019 No Yes extremity L97.828 Non-pressure chronic ulcer of other part of left lower leg with other specified 03/21/2020 No Yes severity I87.322 Chronic venous hypertension (idiopathic) with inflammation of left lower 02/14/2020 No Yes extremity L97.522 Non-pressure chronic ulcer of other part of left foot with fat layer exposed 01/25/2020 No Yes Inactive Problems ICD-10 Code Description Active Date Inactive Date L97.812 Non-pressure chronic ulcer of other part of right lower leg with fat layer exposed 12/20/2019 12/20/2019 Resolved Problems Electronic Signature(s) Signed: 07/21/2020 4:24:36 PM By: Tobi Bastos MD, MBA Signed: 07/21/2020 5:19:36 PM By: [name] RN, BSN Entered By: [name] on 07/21/2020 08:12:13 -------------------------------------------------------------------------------- Progress Note Details Patient Name: Date of Service: [name], Tracy [place]. 07/21/2020 8:00 A M Medical Record Number: [id number] Patient Account Number: [id number] Date of Birth/Sex: Treating RN: [birth year] (86 y.o. [name] Primary Care Provider: Patrina Levering Other Clinician: Referring Provider: Treating Provider/Extender: Gifford Shave T Weeks in Treatment: 30 Subjective History of Present Illness (HPI) ADMISSION 12/20/2019 This is an 86 year old woman referred by her primary physician Dr. Felipa Eth for review of a wound on her right lateral lower leg. She was actually in this clinic on 2 separate occasions in 2010 and 2012 cared for by Dr. Sherilyn Cooter. At that point  in time she had wounds on her right leg as well. She tells [place] to 1 month ago she noticed a scab building up on her right lateral lower leg this opened into a wound. There was  no overt cause of this no trauma, no infection that she is aware of. She has a history of chronic venous insufficiency and wears compression stockings fairly religiously indeed she is done well over the last 8 years since she was last in this clinic. She has been applying Vaseline on this and a covering phone. This is not progressing towards healing. Past medical history; interstitial lung disease, chronic atrial fibrillation status post pacemaker, osteoarthritis of the left knee, left breast CA, chronic repeat venous insufficiency. She takes Eliquis for her atrial fibrillation stroke prophylaxis. ABI in our clinic was 0.99 on the right 1/15; superficial wound on the right lateral calf in the setting of severe acute skin changes from chronic venous insufficiency and lymphedema. We used Iodoflex last week she complained of a lot of pain 1/22; this is a small but difficult wound on the right lateral calf in the setting of severe chronic venous insufficiency and secondary lymphedema. She continues to state the wounds things hurts when she is up on it but seems to be relieved by putting her leg up. I changed her to [facility] last week because the Iodoflex seem to be causing stinging. 1/29. This wound appears to be contracting somewhat. Changes of chronic venous insufficiency with secondary lymphedema 2/12; not as good as surface today and slightly bigger. I had to change her from Iodoflex to [facility] because of the stinging pain although she does not think it was any better on the [facility]. ooShe comes into clinic today with a area on the medial left great toe. She said she noticed blood on her sock last Saturday. She had some form of bunion surgery by Dr. Ila Mcgill her podiatrist sometime in 2019 she said he  "shave the area". I could not find his operative report although I did see reference to the bunion in the area. 2/19; somewhat improved wound on the right lateral lower leg however the wound she came in on the bunion of her left great toe is actually I think larger still somewhat inflamed. We have been using Iodoflex 2/26; not much improvement in either wound area which is the original venous wound on the right lateral lower leg and in the area on the bunion of her left great toe. This still looks somewhat inflamed and tender. We have been using Iodoflex with open much improvement. 3/4; in general both her wounds look better this includes the original venous insufficiency wound on the right lateral lower leg and the area on her tip of her bunion on the left great toe medial aspect of the MTP. Change to Hydrofera Blue last week 3/12; the patient still has a small geographic shaped wound on the right lateral lower leg. We have been using Hydrofera Blue but I changed her to endoform today. She also has the area in the tip of the bunion of the left great toe. We will try Hydrofera Blue here as well 3/19; the small geographic wound on the right lateral lower leg has not filled in. There is some surrounding induration we have noticed this previously. This may be venous inflammation but I wonder about biopsying this if this is not closing up. The area over the left medial first MTP [bunion deformity] requires debridement. This is not closing in. I am not convinced she is offloading adequately 3/26; right lateral lower leg in the setting of severe venous insufficiency and also an area over the first MTP bunion deformity. No debridement is required. We have  been using endoform 4/9; right lateral lower leg wound in the setting of severe venous insufficiency and also a refractory area over the first MTP bunion deformity. The area on the right lateral lower leg is just about closed there is still a minor open  area here. She comes in today with a mirror image area on the left lateral lower leg. She says this started as a scab 2 weeks ago and is gradually morphed into an open wound very similar to what she has on the right leg. She has severe bilateral venous insufficiency with venous hypertension obvious from clinical exam. 4/16; her original wound the right lateral leg wound in the setting of chronic venous hypertension is a small open area but very small. We have been using endoform. Her wound over the left first MTP bunion deformity also appears to be small and closing in. Unfortunately she has a large relative area on the left lateral calf which was Tracy last week. Very adherent debris over this we have been using Iodoflex however that may be contributing to the debris on the wound surface 4/23; the original wound is closed and the area on the left first metatarsal bunion deformity is also almost closed the Tracy area from last week required debridement. She went for her reflux studies that did not take her lower extremity wraps off. This is not helpful. She did have significant reflux in the right common femoral vein. I am not going to press this issue further. She clinically has severe venous hypertension 4/29; the original wound is closed on the right lateral lower calf. The area on the left first metatarsal/bunion deformity has a small slitlike opening that is still not closed. The real problem here is now wound on the left lateral calf which is necrotic and deep. We used Iodoflex on this wound last time. Silver alginate on the left first toe The patient has [place] wraps. We should be able to transition the right leg into compression stockings and were doing this today. 5/7; the original wound remains closed on the right lateral calf. The left first metatarsal head still is open. Deterioration in the wound which is the Tracy wound from several weeks ago on the left lateral calf. The patient is not aware  how this could have happened. We have been using Sorbact starting last week under 3 layer compression 5/14; the right lateral calf is closed the area on the first met head on the left is closed However the Tracy area from 3 weeks ago on the left lateral calf continues to expand. There is raised nodular tissue around the wound necrotic debris on the surface. Circumference of the wound also looks necrotic. It looks as though there is involvement of the tissue under the skin around the large area of this wound which looks threatened. She is complaining of pain. Culture of this wound I did last week showed rare coag negative staph variant I have never heard of although I am doubtful this has clinical significance 05/09/20-Patient returns with a left lateral calf wound looking about the same, the biopsies reviewed show no atypical features and consistent with trauma or pressure injury or stasis change, patient has appointment to be scheduled with vascular We are using silver alginate and 3 layer compression Patient was started on Trental by dermatology which she has been taking for a week now 6/4; left lateral calf wound. I reviewed the dermatology notes with Dr. Martin Majestic from [facility] dermatology. See suggested the possibility of livedoid  vasculopathy possibility of additional biopsies which I think would have to be punch biopsies. Put her on pentoxifylline noteworthy that she is on Eliquis as well. We have been using silver alginate under compression. She has an appointment for repeat vascular studies on 7/6. This gets back to the fact that they did not take the wrap off on the original studies that were done. I do not believe she has an arterial issue. 6/10; left lateral calf wound. I put her on Hydrofera Blue last week. Some improvement in the surface condition of the wound. She has repeat vascular studies/reflux studies on 7/6. We are trying to get Apligraf through [place] healthcare. Her husband  states he looked on the website this is not going to be approved. I am really not sure if this is a "class effect" 6/18; somewhat surprisingly Apligraf was approved and 100% covered. Her husband was also quite shocked by this. Nevertheless we have her repeat vascular studies on 7/6 and I will not be able to apply this until this is done. We are using Hydrofera Blue to the wound on the left. Her original wound on the right lower leg has maintain closure using compression stocking 6/25; repeat vascular studies/venous on 7/6; we will apply the Apligraf I think that week. We are also going to work around a week's vacation they have the first week in August. 7/2; reflux studies on 7/6;. Likely place first Apligraf from the major wound area on 7/9; 7/9; patient was kindly seen by Dr. Donnetta Hutching of vein and vascular to review her circulation status with the refractory wounds in the left leg. At first wounds in this clinic were on the right leg and they heal she now has a juxta lite stocking. Dr. Donnetta Hutching thought she had venous stasis disease with venous ulcerations. He did not feel that there was anything that would suggest to expect approval with a blush ablation of her saphenous vein the saphenous veins were felt to be relatively small in caliber. She was not felt to have an arterial issue Apligraf #1 apply today in the standard fashion 7/23; the patient came in today complaining of a lot of pain in the left lower leg where the wound is. We applied Apligraf 2 weeks ago I also increased her compression from 3-4 layer wondering about the venous hypertension. She arrives today with her original wound that we have been treating with Apligraf looking quite healthy although there is some surface slough. Problematically she has de-epithelialized and developed erythema below the wound towards the lateral malleolus itself. This is somewhat angry. I think this is venous stasis loss of epithelialization. I do not believe  this is infection . Apligraf #2 applied to the original wound area 8/9-Patient returns at 2 weeks for Apligraf #3, the left leg venous leg ulcer area had more slough this time Objective Constitutional alert and oriented x 3. sitting or standing blood pressure is within target range for patient.. supine blood pressure is within target range for patient.. pulse regular and within target range for patient.Marland Kitchen respirations regular, non-labored and within target range for patient.Marland Kitchen temperature within target range for patient.. Well- nourished and well-hydrated in no acute distress. Vitals Time Taken: 8:09 AM, Height: 69 in, Weight: 163 lbs, BMI: 24.1, Temperature: 97.7 F, Pulse: 83 bpm, Respiratory Rate: 18 breaths/min, Blood Pressure: 175/77 mmHg. General Notes: Left lateral leg venous ulcer with some slough that was removed with a curette, surrounding skin appears intact Integumentary (Hair, Skin) Wound #5 status is Open.  Original cause of wound was Gradually Appeared. The wound is located on the Left,Lateral Lower Leg. The wound measures 5cm length x 3.5cm width x 0.1cm depth; 13.744cm^2 area and 1.374cm^3 volume. There is Fat Layer (Subcutaneous Tissue) Exposed exposed. There is no tunneling or undermining noted. There is a medium amount of serosanguineous drainage noted. The wound margin is flat and intact. There is medium (34-66%) pink granulation within the wound bed. There is a medium (34-66%) amount of necrotic tissue within the wound bed including Adherent Slough. Wound #7 status is Open. Original cause of wound was Gradually Appeared. The wound is located on the Left,Distal,Lateral Lower Leg. The wound measures 2.5cm length x 2.7cm width x 0.1cm depth; 5.301cm^2 area and 0.53cm^3 volume. There is Fat Layer (Subcutaneous Tissue) Exposed exposed. There is no tunneling or undermining noted. There is a medium amount of serosanguineous drainage noted. The wound margin is flat and intact. There is  no granulation within the wound bed. There is a large (67-100%) amount of necrotic tissue within the wound bed including Adherent Slough. Assessment Active Problems ICD-10 Chronic venous hypertension (idiopathic) with inflammation of right lower extremity Non-pressure chronic ulcer of other part of left lower leg with other specified severity Chronic venous hypertension (idiopathic) with inflammation of left lower extremity Non-pressure chronic ulcer of other part of left foot with fat layer exposed Procedures Wound #5 Pre-procedure diagnosis of Wound #5 is a Venous Leg Ulcer located on the Left,Lateral Lower Leg .Severity of Tissue Pre Debridement is: Fat layer exposed. There was a Excisional Skin/Subcutaneous Tissue Debridement with a total area of 17.5 sq cm performed by Tobi Bastos, MD. With the following instrument(s): Curette to remove Viable and Non-Viable tissue/material. Material removed includes Subcutaneous Tissue and Slough and after achieving pain control using Other (Benzocaine 20%). No specimens were taken. A time out was conducted at 08:33, prior to the start of the procedure. A Minimum amount of bleeding was controlled with Pressure. The procedure was tolerated well with a pain level of 4 throughout and a pain level of 2 following the procedure. Post Debridement Measurements: 5cm length x 3.5cm width x 0.1cm depth; 1.374cm^3 volume. Character of Wound/Ulcer Post Debridement is improved. Severity of Tissue Post Debridement is: Fat layer exposed. Post procedure Diagnosis Wound #5: Same as Pre-Procedure Pre-procedure diagnosis of Wound #5 is a Venous Leg Ulcer located on the Left,Lateral Lower Leg. A skin graft procedure using a bioengineered skin substitute/cellular or tissue based product was performed by Tobi Bastos, MD with the following instrument(s): N/A. Apligraf was applied and secured with Steri-Strips. 34 sq cm of product was utilized and 0 sq cm was wasted. Post  Application, adaptic, gauze, compression wrap was applied. A Time Out was conducted at 08:40, prior to the start of the procedure. The procedure was tolerated well with a pain level of 0 throughout and a pain level of 0 following the procedure. Post procedure Diagnosis Wound #5: Same as Pre-Procedure . Pre-procedure diagnosis of Wound #5 is a Venous Leg Ulcer located on the Left,Lateral Lower Leg . There was a Three Layer Compression Therapy Procedure by [name], RN. Post procedure Diagnosis Wound #5: Same as Pre-Procedure Wound #7 Pre-procedure diagnosis of Wound #7 is a Venous Leg Ulcer located on the Left,Distal,Lateral Lower Leg .Severity of Tissue Pre Debridement is: Fat layer exposed. There was a Excisional Skin/Subcutaneous Tissue Debridement with a total area of 6.75 sq cm performed by Tobi Bastos, MD. With the following instrument(s): Curette to remove Viable and Non-Viable  tissue/material. Material removed includes Subcutaneous Tissue and Slough and after achieving pain control using Other (Benzocaine 20%). No specimens were taken. A time out was conducted at 08:33, prior to the start of the procedure. A Minimum amount of bleeding was controlled with Pressure. The procedure was tolerated well with a pain level of 4 throughout and a pain level of 2 following the procedure. Post Debridement Measurements: 2.5cm length x 2.7cm width x 0.1cm depth; 0.53cm^3 volume. Character of Wound/Ulcer Post Debridement is improved. Severity of Tissue Post Debridement is: Fat layer exposed. Post procedure Diagnosis Wound #7: Same as Pre-Procedure Pre-procedure diagnosis of Wound #7 is a Venous Leg Ulcer located on the Left,Distal,Lateral Lower Leg. A skin graft procedure using a bioengineered skin substitute/cellular or tissue based product was performed by Tobi Bastos, MD with the following instrument(s): N/A. Apligraf was applied and secured with Steri-Strips. 10 sq cm of product was  utilized and 0 sq cm was wasted. Post Application, adaptic, gauze, compression wrap was applied. A Time Out was conducted at 08:40, prior to the start of the procedure. The procedure was tolerated well with a pain level of 0 throughout and a pain level of 0 following the procedure. Post procedure Diagnosis Wound #7: Same as Pre-Procedure . Pre-procedure diagnosis of Wound #7 is a Venous Leg Ulcer located on the Left,Distal,Lateral Lower Leg . There was a Three Layer Compression Therapy Procedure by [name], RN. Post procedure Diagnosis Wound #7: Same as Pre-Procedure Plan Follow-up Appointments: Return Appointment in 2 weeks. - MD visit for 2nd apligraft Nurse Visit: - next week Dressing Change Frequency: Wound #5 Left,Lateral Lower Leg: Do not change entire dressing for one week. Skin Barriers/Peri-Wound Care: Barrier cream - to denuded sites Moisturizing lotion - patient to lotion right leg every night. TCA Cream or Ointment - mixed with lotion to left leg Wound Cleansing: May shower with protection. - cast protector Primary Wound Dressing: Wound #5 Left,Lateral Lower Leg: Skin Substitute Application - Apligraft #2 Wound #7 Left,Distal,Lateral Lower Leg: Calcium Alginate with Silver - TCA area then silver alginate Secondary Dressing: Wound #5 Left,Lateral Lower Leg: Adaptic Dressing - with steri-strips ABD pad Wound #7 Left,Distal,Lateral Lower Leg: Dry Gauze ABD pad Edema Control: 3 Layer Compression System - Left Lower Extremity Avoid standing for long periods of time Elevate legs to the level of the heart or above for 30 minutes daily and/or when sitting, a frequency of: - throughout the day. Exercise regularly Support Garment 20-30 mm/Hg pressure to: - patient to apply farrow wrap 4000 to right leg. Apply in the morning and remove at night. Off-Loading: Open toe surgical shoe to: - left -Continue 3 layer compression to left lower extremity -Asked to keep legs  elevated when sitting -Asked to continue attention to nutrition and protein intake -Apligraf #3 applied to the surface -Patient back for nurse visit next week and for next application in 2 weeks Electronic Signature(s) Signed: 07/21/2020 8:46:35 AM By: Tobi Bastos MD, MBA Entered By: Tobi Bastos on 07/21/2020 08:46:34 -------------------------------------------------------------------------------- SuperBill Details Patient Name: Date of Service: Tracy Sermon. 07/21/2020 Medical Record Number: [id number] Patient Account Number: [id number] Date of Birth/Sex: Treating RN: 1933/11/26 (86 y.o. [name] Primary Care Provider: Lajean Manes T Other Clinician: Referring Provider: Treating Provider/Extender: Gifford Shave T Weeks in Treatment: 30 Diagnosis Coding ICD-10 Codes Code Description [phone] Chronic venous hypertension (idiopathic) with inflammation of right lower extremity L97.828 Non-pressure chronic ulcer of other part of left lower leg with other specified  severity I87.322 Chronic venous hypertension (idiopathic) with inflammation of left lower extremity L97.522 Non-pressure chronic ulcer of other part of left foot with fat layer exposed Facility Procedures The patient participates with Medicare or their insurance follows the Medicare Facility Guidelines: CPT4 Code Description Modifier Quantity [id number] (Facility Use Only) Apligraf 1 SQ CM 44 ICD-10 Diagnosis Description I87.322 Chronic venous hypertension  (idiopathic) with inflammation of left lower extremity The patient participates with Medicare or their insurance follows the Medicare Facility Guidelines: [id number] - SKIN SUB GRAFT TRNK/ARM/LEG 1 ICD-10 Diagnosis Description I87.322 Chronic venous hypertension (idiopathic) with inflammation of left  lower extremity L97.828 Non-pressure chronic ulcer of other part of left lower leg with other specified severity Physician Procedures : CPT4  Code Description Modifier [id number] - WC PHYS SKIN SUB GRAFT TRNK/ARM/LEG ICD-10 Diagnosis Description I87.322 Chronic venous hypertension (idiopathic) with inflammation of left lower extremity L97.828 Non-pressure chronic ulcer of other part  of left lower leg with other specified severity Quantity: 1 Electronic Signature(s) Signed: 07/21/2020 4:24:36 PM By: Tobi Bastos MD, MBA Signed: 07/21/2020 5:19:36 PM By: [name] RN, BSN Previous Signature: 07/21/2020 8:46:59 AM Version By: Tobi Bastos MD, MBA Entered By: [name] on 07/21/2020 09:31:14

## 2020-07-22 NOTE — Progress Notes (Signed)
Green Green ([id number]) Visit Report for 07/21/2020 Arrival Information Details Patient Name: Date of Service: Green Green 07/21/2020 8:00 A M Medical Record Number: [id number] Patient Account Number: [id number] Date of Birth/Sex: Treating RN: 09-11-1933 (86 y.o. [name] Primary Care Arpi Diebold: Lajean Manes T Other Clinician: Referring Kessie Croston: Treating Lorenzo Pereyra/Extender: [place] Large Weeks in Treatment: 30 Visit Information History Since Last Visit Added or deleted any medications: No Patient Arrived: Ambulatory Any new allergies or adverse reactions: No Arrival Time: 08:08 Had a fall or experienced change in No Accompanied By: husband activities of daily living that may affect Transfer Assistance: None risk of falls: Patient Identification Verified: Yes Signs or symptoms of abuse/neglect since last visito No Secondary Verification Process Completed: Yes Hospitalized since last visit: No Patient Requires Transmission-Based Precautions: No Implantable device outside of the clinic excluding No Patient Has Alerts: Yes cellular tissue based products placed in the center Patient Alerts: Patient on Blood Thinner since last visit: Right ABI:0.99 Has Dressing in Place as Prescribed: Yes Pain Present Now: Yes Electronic Signature(s) Signed: 07/22/2020 10:17:44 AM By: Sandre Kitty Entered By: Sandre Kitty on 07/21/2020 08:09:00 -------------------------------------------------------------------------------- Compression Therapy Details Patient Name: Date of Service: Green Banker J. 07/21/2020 8:00 A M Medical Record Number: [id number] Patient Account Number: [id number] Date of Birth/Sex: Treating RN: [birth year] (86 y.o. [name] Primary Care Khamil Lamica: Patrina Levering Other Clinician: Referring Despina Boan: Treating Guinevere Stephenson/Extender: Gifford Shave T Weeks in Treatment: 30 Compression Therapy Performed for Wound  Assessment: Wound #5 Left,Lateral Lower Leg Performed By: Clinician [name], RN Compression Type: Three Layer Post Procedure Diagnosis Same as Pre-procedure Electronic Signature(s) Signed: 07/21/2020 5:19:36 PM By: [name] RN, BSN Entered By: [name] on 07/21/2020 08:43:03 -------------------------------------------------------------------------------- Compression Therapy Details Patient Name: Date of Service: Green [place]. 07/21/2020 8:00 A M Medical Record Number: [id number] Patient Account Number: [id number] Date of Birth/Sex: Treating RN: 09-14-1933 (86 y.o. [name] Primary Care Katalina Magri: Patrina Levering Other Clinician: Referring [name]: Treating Veronica Guerrant/Extender: Gifford Shave T Weeks in Treatment: 30 Compression Therapy Performed for Wound Assessment: Wound #7 Left,Distal,Lateral Lower Leg Performed By: Clinician [name], RN Compression Type: Three Layer Post Procedure Diagnosis Same as Pre-procedure Electronic Signature(s) Signed: 07/21/2020 5:19:36 PM By: [name] RN, BSN Entered By: [name] on 07/21/2020 08:43:03 -------------------------------------------------------------------------------- Encounter Discharge Information Details Patient Name: Date of Service: Green Green, [place]. 07/21/2020 8:00 A M Medical Record Number: [id number] Patient Account Number: [id number] Date of Birth/Sex: Treating RN: 1933-01-10 (86 y.o. Green Green Primary Care Azreal Stthomas: Patrina Levering Other Clinician: Referring Khambrel Amsden: Treating Lajuana Patchell/Extender: [place] Large Weeks in Treatment: 30 Encounter Discharge Information Items Post Procedure Vitals Discharge Condition: Stable Temperature (F): 97.7 Ambulatory Status: Ambulatory Pulse (bpm): 83 Discharge Destination: Home Respiratory Rate (breaths/min): 18 Transportation: Private Auto Blood Pressure (mmHg): 175/77 Accompanied By:  husband Schedule Follow-up Appointment: Yes Clinical Summary of Care: Patient Declined Electronic Signature(s) Signed: 07/21/2020 5:16:06 PM By: Kela Millin Entered By: Kela Millin on 07/21/2020 09:03:12 -------------------------------------------------------------------------------- Lower Extremity Assessment Details Patient Name: Date of Service: Green [place]. 07/21/2020 8:00 A M Medical Record Number: [id number] Patient Account Number: [id number] Date of Birth/Sex: Treating RN: [birth year] (86 y.o. [name] Primary Care Kimberlin Scheel: Patrina Levering Other Clinician: Referring Antonietta Lansdowne: Treating Dalene Robards/Extender: Althea Charon, Hal T Weeks in Treatment: 30 Edema Assessment Assessed: [Left: No] [Right: No] Edema: [Left: N] [Right: o] Calf Left: Right: Point of  Measurement: 41 cm From Medial Instep 34 cm cm Ankle Left: Right: Point of Measurement: 14 cm From Medial Instep 23 cm cm Vascular Assessment Pulses: Dorsalis Pedis Palpable: [Left:Yes] Electronic Signature(s) Signed: 07/21/2020 5:19:36 PM By: [name] RN, BSN Entered By: [name] on 07/21/2020 08:19:46 -------------------------------------------------------------------------------- [place] Details Patient Name: Date of Service: Green Banker J. 07/21/2020 8:00 A M Medical Record Number: [id number] Patient Account Number: [id number] Date of Birth/Sex: Treating RN: 04-04-1933 (86 y.o. [name] Primary Care Reyana Leisey: Patrina Levering Other Clinician: Referring Keymoni Mccaster: Treating Domino Holten/Extender: Gifford Shave T Weeks in Treatment: 30 Active Inactive Wound/Skin Impairment Nursing Diagnoses: Knowledge deficit related to ulceration/compromised skin integrity Goals: Patient/caregiver will verbalize understanding of skin care regimen Date Initiated: 12/20/2019 Target Resolution Date: 08/22/2020 Goal Status:  Active Interventions: Assess patient/caregiver ability to perform ulcer/skin care regimen upon admission and as needed Provide education on ulcer and skin care Treatment Activities: Skin care regimen initiated : 12/20/2019 Topical wound management initiated : 12/20/2019 Notes: Electronic Signature(s) Signed: 07/21/2020 5:19:36 PM By: [name] RN, BSN Entered By: [name] on 07/21/2020 08:12:56 -------------------------------------------------------------------------------- Pain Assessment Details Patient Name: Date of Service: Green Banker J. 07/21/2020 8:00 A M Medical Record Number: [id number] Patient Account Number: [id number] Date of Birth/Sex: Treating RN: Sep 19, 1933 (86 y.o. [name] Primary Care Deverick Pruss: Other Clinician: Lajean Manes T Referring Caelin Rosen: Treating Joshia Kitchings/Extender: Gifford Shave T Weeks in Treatment: 30 Active Problems Location of Pain Severity and Description of Pain Patient Has Paino Yes Site Locations Rate the pain. Current Pain Level: 7 Pain Management and Medication Current Pain Management: Electronic Signature(s) Signed: 07/21/2020 5:19:36 PM By: [name] RN, BSN Signed: 07/22/2020 10:17:44 AM By: Sandre Kitty Entered By: Sandre Kitty on 07/21/2020 08:09:26 -------------------------------------------------------------------------------- Patient/Caregiver Education Details Patient Name: Date of Service: [place], Green TSY J. 8/9/2021andnbsp8:00 A M Medical Record Number: [id number] Patient Account Number: [id number] Date of Birth/Gender: Treating RN: 22-Sep-1933 (86 y.o. [name] Primary Care Physician: Lajean Manes T Other Clinician: Referring Physician: Treating Physician/Extender: Ida Rogue in Treatment: 30 Education Assessment Education Provided To: Patient Education Topics Provided Wound/Skin Impairment: Methods: Explain/Verbal Responses: State  content correctly [employer]) Signed: 07/21/2020 5:19:36 PM By: [name] RN, BSN Entered By: [name] on 07/21/2020 08:13:10 -------------------------------------------------------------------------------- Wound Assessment Details Patient Name: Date of Service: Green Banker J. 07/21/2020 8:00 A M Medical Record Number: [id number] Patient Account Number: [id number] Date of Birth/Sex: Treating RN: [birth year] (86 y.o. [name] Primary Care Elmina Hendel: Lajean Manes T Other Clinician: Referring Mildreth Reek: Treating Analucia Hush/Extender: Gifford Shave T Weeks in Treatment: 30 Wound Status Wound Number: 5 Primary Venous Leg Ulcer Etiology: Wound Location: Left, Lateral Lower Leg Wound Open Wounding Event: Gradually Appeared Status: Date Acquired: 03/21/2020 Comorbid Cataracts, Anemia, Arrhythmia, Congestive Heart Failure, Weeks Of Treatment: 17 History: Hypertension, Peripheral Venous Disease, Received Radiation Clustered Wound: No Photos Photo Uploaded By: Mikeal Hawthorne on 07/22/2020 08:32:44 Wound Measurements Length: (cm) 5 Width: (cm) 3.5 Depth: (cm) 0.1 Area: (cm) 13.744 Volume: (cm) 1.374 % Reduction in Area: -2632.4% % Reduction in Volume: -2648% Epithelialization: Small (1-33%) Tunneling: No Undermining: No Wound Description Classification: Full Thickness Without Exposed Support Structu Wound Margin: Flat and Intact Exudate Amount: Medium Exudate Type: Serosanguineous Exudate Color: red, brown res Foul Odor After Cleansing: No Slough/Fibrino Yes Wound Bed Granulation Amount: Medium (34-66%) Exposed Structure Granulation Quality: Pink Fascia Exposed: No Necrotic Amount: Medium (34-66%) Fat Layer (Subcutaneous Tissue) Exposed: Yes  Necrotic Quality: Adherent Slough Tendon Exposed: No Muscle Exposed: No Joint Exposed: No Bone Exposed: No Treatment Notes Wound #5 (Left, Lateral Lower Leg) 1. Cleanse With Wound  Cleanser Soap and water 2. Periwound Care Barrier cream Moisturizing lotion TCA Cream 3. Primary Dressing Applied Cellular Based Tissue Product Other primary dressing (specifiy in notes) 4. Secondary Dressing Dry Gauze Other secondary dressing (specify in notes) 6. Support Layer Applied 3 layer compression wrap Notes apligraft applied by MD, adaptic and steri-strips. netting Electronic Signature(s) Signed: 07/21/2020 5:19:36 PM By: [name] RN, BSN Entered By: [name] on 07/21/2020 08:20:04 -------------------------------------------------------------------------------- Wound Assessment Details Patient Name: Date of Service: Green Banker J. 07/21/2020 8:00 A M Medical Record Number: [id number] Patient Account Number: [id number] Date of Birth/Sex: Treating RN: Apr 15, 1933 (86 y.o. [name] Primary Care Calissa Swenor: Lajean Manes T Other Clinician: Referring Saintclair Schroader: Treating Jahaan Vanwagner/Extender: Gifford Shave T Weeks in Treatment: 30 Wound Status Wound Number: 7 Primary Venous Leg Ulcer Etiology: Wound Location: Left, Distal, Lateral Lower Leg Wound Open Wounding Event: Gradually Appeared Status: Date Acquired: 05/30/2020 Comorbid Cataracts, Anemia, Arrhythmia, Congestive Heart Failure, Weeks Of Treatment: 7 History: Hypertension, Peripheral Venous Disease, Received Radiation Clustered Wound: Yes Photos Photo Uploaded By: Mikeal Hawthorne on 07/22/2020 08:32:57 Wound Measurements Length: (cm) 2.5 Width: (cm) 2.7 Depth: (cm) 0.1 Clustered Quantity: 2 Area: (cm) 5.301 Volume: (cm) 0.53 % Reduction in Area: -3276.4% % Reduction in Volume: -3212.5% Epithelialization: Small (1-33%) Tunneling: No Undermining: No Wound Description Classification: Full Thickness Without Exposed Support Structures Wound Margin: Flat and Intact Exudate Amount: Medium Exudate Type: Serosanguineous Exudate Color: red, brown Foul Odor After Cleansing:  No Slough/Fibrino Yes Wound Bed Granulation Amount: None Present (0%) Exposed Structure Necrotic Amount: Large (67-100%) Fascia Exposed: No Necrotic Quality: Adherent Slough Fat Layer (Subcutaneous Tissue) Exposed: Yes Tendon Exposed: No Muscle Exposed: No Joint Exposed: No Bone Exposed: No Treatment Notes Wound #7 (Left, Distal, Lateral Lower Leg) 1. Cleanse With Wound Cleanser Soap and water 2. Periwound Care Barrier cream Moisturizing lotion TCA Cream 3. Primary Dressing Applied Cellular Based Tissue Product Other primary dressing (specifiy in notes) 4. Secondary Dressing Dry Gauze Other secondary dressing (specify in notes) 6. Support Layer Applied 3 layer compression wrap Notes apligraft applied by MD, adaptic and steri-strips. netting Electronic Signature(s) Signed: 07/21/2020 5:19:36 PM By: [name] RN, BSN Entered By: [name] on 07/21/2020 08:20:30 -------------------------------------------------------------------------------- Vitals Details Patient Name: Date of Service: Green Green, [place]. 07/21/2020 8:00 A M Medical Record Number: [id number] Patient Account Number: [id number] Date of Birth/Sex: Treating RN: July 18, 1933 (86 y.o. [name] Primary Care Analiah Drum: Lajean Manes T Other Clinician: Referring Anushri Casalino: Treating Harshita Bernales/Extender: Gifford Shave T Weeks in Treatment: 30 Vital Signs Time Taken: 08:09 Temperature (F): 97.7 Height (in): 69 Pulse (bpm): 83 Weight (lbs): 163 Respiratory Rate (breaths/min): 18 Body Mass Index (BMI): 24.1 Blood Pressure (mmHg): 175/77 Reference Range: 80 - 120 mg / dl Electronic Signature(s) Signed: 07/22/2020 10:17:44 AM By: Sandre Kitty Entered By: Sandre Kitty on 07/21/2020 08:09:18

## 2020-07-28 ENCOUNTER — Encounter (HOSPITAL_BASED_OUTPATIENT_CLINIC_OR_DEPARTMENT_OTHER): Payer: Medicare Other | Admitting: Internal Medicine

## 2020-07-28 DIAGNOSIS — I87303 Chronic venous hypertension (idiopathic) without complications of bilateral lower extremity: Secondary | ICD-10-CM | POA: Diagnosis not present

## 2020-07-28 NOTE — Progress Notes (Signed)
[name], [name] ([id number]) Visit Report for 07/28/2020 [place]Bill Details Patient Name: Date of Service: [place], [name] 07/28/2020 Medical Record Number: [id number] Patient Account Number: [id number] Date of Birth/Sex: Treating RN: November 24, 1933 (86 y.o. [name] Primary Care Provider: Lajean Manes T Other Clinician: Referring Provider: Treating Provider/Extender: Evelena Peat in Treatment: 31 Diagnosis Coding ICD-10 Codes Code Description I87.321 Chronic venous hypertension (idiopathic) with inflammation of right lower extremity L97.828 Non-pressure chronic ulcer of other part of left lower leg with other specified severity I87.322 Chronic venous hypertension (idiopathic) with inflammation of left lower extremity L97.522 Non-pressure chronic ulcer of other part of left foot with fat layer exposed Facility Procedures The patient participates with Medicare or their insurance follows the Medicare Facility Guidelines CPT4 Code Description Modifier Quantity [id number] (Facility Use Only) [id number] - APPLY [place] 1 Electronic Signature(s) Signed: 07/28/2020 5:24:06 PM By: Kela Millin Signed: 07/28/2020 5:53:04 PM By: Linton Ham MD Entered By: Kela Millin on 07/28/2020 08:45:35

## 2020-07-28 NOTE — Progress Notes (Signed)
Tracy, Green ([id number]) Visit Report for 07/28/2020 Arrival Information Details Patient Name: Date of Service: [name] 07/28/2020 8:30 A M Medical Record Number: [id number] Patient Account Number: [id number] Date of Birth/Sex: Treating RN: [birth year] (86 y.o. [name] Primary Care Hunter Bachar: Lajean Manes T Other Clinician: Referring Orlena Garmon: Treating Kden Wagster/Extender: Evelena Peat in Treatment: 10 Visit Information History Since Last Visit Added or deleted any medications: No Patient Arrived: Ambulatory Any new allergies or adverse reactions: No Arrival Time: 08:37 Had a fall or experienced change in No Accompanied By: husband activities of daily living that may affect Transfer Assistance: None risk of falls: Patient Identification Verified: Yes Signs or symptoms of abuse/neglect since last visito No Secondary Verification Process Completed: Yes Hospitalized since last visit: No Patient Requires Transmission-Based Precautions: No Implantable device outside of the clinic excluding No Patient Has Alerts: Yes cellular tissue based products placed in the center Patient Alerts: Patient on Blood Thinner since last visit: Right ABI:0.99 Has Dressing in Place as Prescribed: Yes Pain Present Now: Yes Electronic Signature(s) Signed: 07/28/2020 9:11:13 AM By: Sandre Kitty Entered By: Sandre Kitty on 07/28/2020 08:37:31 -------------------------------------------------------------------------------- Compression Therapy Details Patient Name: Date of Service: [name]. 07/28/2020 8:30 A M Medical Record Number: [id number] Patient Account Number: [id number] Date of Birth/Sex: Treating RN: 1933-09-08 (86 y.o. [name] Primary Care Sigfredo Schreier: Patrina Levering Other Clinician: Referring Janett Kamath: Treating Ashey Tramontana/Extender: Evelena Peat in Treatment: 31 Compression Therapy Performed for  Wound Assessment: Wound #5 Left,Lateral Lower Leg Performed By: Clinician Kela Millin, RN Compression Type: Three Layer Electronic Signature(s) Signed: 07/28/2020 5:24:06 PM By: Kela Millin Entered By: Kela Millin on 07/28/2020 08:43:35 -------------------------------------------------------------------------------- Compression Therapy Details Patient Name: Date of Service: [name]. 07/28/2020 8:30 A M Medical Record Number: [id number] Patient Account Number: [id number] Date of Birth/Sex: Treating RN: Aug 13, 1933 (86 y.o. [name] Primary Care Shikha Bibb: Other Clinician: Patrina Levering Referring Ameera Tigue: Treating Maryalyce Sanjuan/Extender: [name] in Treatment: 31 Compression Therapy Performed for Wound Assessment: Wound #7 Left,Distal,Lateral Lower Leg Performed By: Clinician Kela Millin, RN Compression Type: Three Layer Electronic Signature(s) Signed: 07/28/2020 5:24:06 PM By: Kela Millin Entered By: Kela Millin on 07/28/2020 08:43:36 -------------------------------------------------------------------------------- Encounter Discharge Information Details Patient Name: Date of Service: [name], [place]. 07/28/2020 8:30 A M Medical Record Number: [id number] Patient Account Number: [id number] Date of Birth/Sex: Treating RN: 10-10-1933 (86 y.o. [name] Primary Care Lakayla Barrington: Patrina Levering Other Clinician: Referring Duaa Stelzner: Treating Shasha Buchbinder/Extender: Evelena Peat in Treatment: 39 Encounter Discharge Information Items Discharge Condition: Stable Ambulatory Status: Ambulatory Discharge Destination: Home Transportation: Private Auto Accompanied By: husband Schedule Follow-up Appointment: Yes Clinical Summary of Care: Patient Declined Electronic Signature(s) Signed: 07/28/2020 5:24:06 PM By: Kela Millin Entered By: Kela Millin on 07/28/2020  08:45:24 -------------------------------------------------------------------------------- Patient/Caregiver Education Details Patient Name: Date of Service: [name]. 8/16/2021andnbsp8:30 A M Medical Record Number: [id number] Patient Account Number: [id number] Date of Birth/Gender: Treating RN: 1933-04-11 (86 y.o. [name] Primary Care Physician: Lajean Manes T Other Clinician: Referring Physician: Treating Physician/Extender: Evelena Peat in Treatment: 38 Education Assessment Education Provided To: Patient Education Topics Provided Wound/Skin Impairment: Handouts: Caring for Your Ulcer Methods: Explain/Verbal Responses: State content correctly Electronic Signature(s) Signed: 07/28/2020 5:24:06 PM By: Kela Millin Entered By: Kela Millin on 07/28/2020 08:45:09 -------------------------------------------------------------------------------- Wound Assessment Details Patient Name: Date of Service: [name]. 07/28/2020  8:30 A M Medical Record Number: [id number] Patient Account Number: [id number] Date of Birth/Sex: Treating RN: 1933-08-05 (86 y.o. [name] Primary Care Jamariya Davidoff: Lajean Manes T Other Clinician: Referring Melquisedec Journey: Treating Shelley Pooley/Extender: [name] in Treatment: 31 Wound Status Wound Number: 5 Primary Venous Leg Ulcer Etiology: Wound Location: Left, Lateral Lower Leg Wound Open Wounding Event: Gradually Appeared Status: Date Acquired: 03/21/2020 Comorbid Cataracts, Anemia, Arrhythmia, Congestive Heart Failure, Weeks Of Treatment: 18 History: Hypertension, Peripheral Venous Disease, Received Radiation Clustered Wound: No Wound Measurements Length: (cm) 5 Width: (cm) 3.5 Depth: (cm) 0.1 Area: (cm) 13.744 Volume: (cm) 1.374 % Reduction in Area: -2632.4% % Reduction in Volume: -2648% Epithelialization: Small (1-33%) Tunneling: No Undermining:  No Wound Description Classification: Full Thickness Without Exposed Support Structures Wound Margin: Flat and Intact Exudate Amount: Medium Exudate Type: Serosanguineous Exudate Color: red, brown Foul Odor After Cleansing: No Slough/Fibrino Yes Wound Bed Granulation Amount: Medium (34-66%) Exposed Structure Granulation Quality: Pink Fascia Exposed: No Necrotic Amount: Medium (34-66%) Fat Layer (Subcutaneous Tissue) Exposed: Yes Necrotic Quality: Adherent Slough Tendon Exposed: No Muscle Exposed: No Joint Exposed: No Bone Exposed: No Treatment Notes Wound #5 (Left, Lateral Lower Leg) 1. Cleanse With Wound Cleanser Soap and water 3. Primary Dressing Applied Other primary dressing (specifiy in notes) 4. Secondary Dressing ABD Pad Dry Gauze 6. Support Layer Applied 3 layer compression wrap Notes apligraft/adaptic/steri-strips remain in place. outer dressing changed Electronic Signature(s) Signed: 07/28/2020 5:14:32 PM By: [name] RN, BSN Signed: 07/28/2020 5:24:06 PM By: Kela Millin Signed: 07/28/2020 5:24:06 PM By: Kela Millin Entered By: Kela Millin on 07/28/2020 08:42:08 -------------------------------------------------------------------------------- Wound Assessment Details Patient Name: Date of Service: [name], [place] 07/28/2020 8:30 A M Medical Record Number: [id number] Patient Account Number: [id number] Date of Birth/Sex: Treating RN: 05-05-1933 (86 y.o. [name] Primary Care Nickia Boesen: Patrina Levering Other Clinician: Referring Maralee Higuchi: Treating Deaun Rocha/Extender: [name] in Treatment: 31 Wound Status Wound Number: 7 Primary Venous Leg Ulcer Etiology: Wound Location: Left, Distal, Lateral Lower Leg Wound Open Wounding Event: Gradually Appeared Status: Date Acquired: 05/30/2020 Comorbid Cataracts, Anemia, Arrhythmia, Congestive Heart Failure, Weeks Of Treatment: 8 History: Hypertension,  Peripheral Venous Disease, Received Radiation Clustered Wound: Yes Wound Measurements Length: (cm) 2. Width: (cm) 2. Depth: (cm) 0. Clustered Quantity: 2 Area: (cm) 5 Volume: (cm) 0 5 % Reduction in Area: -3276.4% 7 % Reduction in Volume: -3212.5% 1 Epithelialization: Small (1-33%) Tunneling: No .301 Undermining: No .53 Wound Description Classification: Full Thickness Without Exposed Support Str Wound Margin: Flat and Intact Exudate Amount: Medium Exudate Type: Serosanguineous Exudate Color: red, brown uctures Foul Odor After Cleansing: No Slough/Fibrino Yes Wound Bed Granulation Amount: None Present (0%) Exposed Structure Necrotic Amount: Large (67-100%) Fascia Exposed: No Necrotic Quality: Adherent Slough Fat Layer (Subcutaneous Tissue) Exposed: Yes Tendon Exposed: No Muscle Exposed: No Joint Exposed: No Bone Exposed: No Treatment Notes Wound #7 (Left, Distal, Lateral Lower Leg) 1. Cleanse With Wound Cleanser Soap and water 3. Primary Dressing Applied Other primary dressing (specifiy in notes) 4. Secondary Dressing ABD Pad Dry Gauze 6. Support Layer Applied 3 layer compression wrap Notes apligraft/adaptic/steri-strips remain in place. outer dressing changed Electronic Signature(s) Signed: 07/28/2020 5:14:32 PM By: [name] RN, BSN Signed: 07/28/2020 5:24:06 PM By: Kela Millin Entered By: Kela Millin on 07/28/2020 08:42:29 -------------------------------------------------------------------------------- Vitals Details Patient Name: Date of Service: [name], [place] 07/28/2020 8:30 A M Medical Record Number: [id number] Patient Account Number: [id number] Date of Birth/Sex: Treating RN: 23-Mar-1933 (  86 y.o. [name] Primary Care Mattisen Pohlmann: Lajean Manes T Other Clinician: Referring Orien Mayhall: Treating Jenissa Tyrell/Extender: Evelena Peat in Treatment: 31 Vital Signs Time Taken: 08:37 Temperature (F):  97.7 Height (in): 69 Pulse (bpm): 42 Weight (lbs): 163 Respiratory Rate (breaths/min): 18 Body Mass Index (BMI): 24.1 Blood Pressure (mmHg): 147/69 Reference Range: 80 - 120 mg / dl Electronic Signature(s) Signed: 07/28/2020 9:11:13 AM By: Sandre Kitty Entered By: Sandre Kitty on 07/28/2020 08:37:48

## 2020-08-04 ENCOUNTER — Encounter (HOSPITAL_BASED_OUTPATIENT_CLINIC_OR_DEPARTMENT_OTHER): Payer: Medicare Other | Admitting: Internal Medicine

## 2020-08-04 DIAGNOSIS — I87303 Chronic venous hypertension (idiopathic) without complications of bilateral lower extremity: Secondary | ICD-10-CM | POA: Diagnosis not present

## 2020-08-05 NOTE — Progress Notes (Signed)
[name], [name] ([id number]) Visit Report for 08/04/2020 Arrival Information Details Patient Name: Date of Service: [name], [name] 08/04/2020 8:30 A M Medical Record Number: [id number] Patient Account Number: [id number] Date of Birth/Sex: Treating RN: 05-13-1933 (86 y.o. [name] Primary Care [name]: [name] Other Clinician: Referring [name]: Treating [name]/Extender: [name] in Treatment: 54 Visit Information History Since Last Visit Added or deleted any medications: No Patient Arrived: Ambulatory Any new allergies or adverse reactions: No Arrival Time: 08:36 Had a fall or experienced change in No Accompanied By: husband activities of daily living that may affect Transfer Assistance: None risk of falls: Patient Identification Verified: Yes Signs or symptoms of abuse/neglect since last visito No Secondary Verification Process Completed: Yes Hospitalized since last visit: No Patient Requires Transmission-Based Precautions: No Implantable device outside of the clinic excluding No Patient Has Alerts: Yes cellular tissue based products placed in the center Patient Alerts: Patient on Blood Thinner since last visit: Right ABI:0.99 Has Dressing in Place as Prescribed: Yes Pain Present Now: Yes Electronic Signature(s) Signed: 08/04/2020 2:53:33 PM By: [name] Entered By: [name] on 08/04/2020 08:37:10 -------------------------------------------------------------------------------- Compression Therapy Details Patient Name: Date of Service: [name]. 08/04/2020 8:30 A M Medical Record Number: [id number] Patient Account Number: [id number] Date of Birth/Sex: Treating RN: 12-09-1933 (86 y.o. [name] Primary Care Yashas Camilli: [name] Other Clinician: Referring Sirr Green: Treating Preesha Benjamin/Extender: [name] Weeks in Treatment: 32 Compression Therapy Performed for Wound  Assessment: Wound #5 Left,Lateral Lower Leg Performed By: Clinician Tracy Hurst, RN Compression Type: Three Layer Post Procedure Diagnosis Same as Pre-procedure Electronic Signature(s) Signed: 08/04/2020 4:21:43 PM By: Tracy Hurst RN, BSN Entered By: [name] on 08/04/2020 09:35:17 -------------------------------------------------------------------------------- Compression Therapy Details Patient Name: Date of Service: [name]. 08/04/2020 8:30 A M Medical Record Number: [id number] Patient Account Number: [id number] Date of Birth/Sex: Treating RN: 1933/08/27 (86 y.o. [name] Primary Care Elion Hocker: [name] Other Clinician: Referring Aliannah Holstrom: Treating Poonam Woehrle/Extender: [name] Weeks in Treatment: 32 Compression Therapy Performed for Wound Assessment: Wound #7 Left,Distal,Lateral Lower Leg Performed By: Clinician Tracy Hurst, RN Compression Type: Three Layer Post Procedure Diagnosis Same as Pre-procedure Electronic Signature(s) Signed: 08/04/2020 4:21:43 PM By: Tracy Hurst RN, BSN Entered By: [name] on 08/04/2020 09:35:17 -------------------------------------------------------------------------------- Encounter Discharge Information Details Patient Name: Date of Service: [name]. 08/04/2020 8:30 A M Medical Record Number: [id number] Patient Account Number: [id number] Date of Birth/Sex: Treating RN: [birth year] (86 y.o. [name] Primary Care [name]: [name] Other Clinician: Referring Corrin Sieling: Treating Demarrion Meiklejohn/Extender: [name] in Treatment: 32 Encounter Discharge Information Items Post Procedure Vitals Discharge Condition: Stable Temperature (F): 97.6 Ambulatory Status: Ambulatory Pulse (bpm): 66 Discharge Destination: Home Respiratory Rate (breaths/min): 18 Transportation: Private Auto Blood Pressure (mmHg): 131/77 Accompanied By:  husband Schedule Follow-up Appointment: Yes Clinical Summary of Care: Patient Declined Electronic Signature(s) Signed: 08/04/2020 4:06:40 PM By: [name] Entered By: [name] on 08/04/2020 09:44:59 -------------------------------------------------------------------------------- Lower Extremity Assessment Details Patient Name: Date of Service: [name]. 08/04/2020 8:30 A M Medical Record Number: [id number] Patient Account Number: [id number] Date of Birth/Sex: Treating RN: 10/15/1933 (86 y.o. [name] Primary Care Shiane Wenberg: [name] Other Clinician: Referring Klynn Linnemann: Treating Lakyn Mantione/Extender: [name] Weeks in Treatment: 32 Edema Assessment Assessed: [Left: No] [Right: No] Edema: [Left: N] [Right: o] Calf Left: Right: Point of  Measurement: 41 cm From Medial Instep 34 cm cm Ankle Left: Right: Point of Measurement: 14 cm From Medial Instep 23 cm cm Vascular Assessment Pulses: Dorsalis Pedis Palpable: [Left:Yes] Electronic Signature(s) Signed: 08/04/2020 4:21:43 PM By: Tracy Hurst RN, BSN Entered By: [name] on 08/04/2020 09:15:24 -------------------------------------------------------------------------------- Multi Wound Chart Details Patient Name: Date of Service: [name]. 08/04/2020 8:30 A M Medical Record Number: [id number] Patient Account Number: [id number] Date of Birth/Sex: Treating RN: 11/03/1933 (86 y.o. [name] Primary Care Aikam Hellickson: [name] Other Clinician: Referring Karima Carrell: Treating [name]/Extender: [name] Weeks in Treatment: 32 Vital Signs Height(in): 69 Pulse(bpm): 66 Weight(lbs): 163 Blood Pressure(mmHg): 131/77 Body Mass Index(BMI): 24 Temperature(F): 97.6 Respiratory Rate(breaths/min): 18 Photos: [5:No Photos Left, Lateral Lower Leg] [7:No Photos Left, Distal, Lateral Lower Leg] [N/A:N/A N/A] Wound Location:  [5:Gradually Appeared] [7:Gradually Appeared] [N/A:N/A] Wounding Event: [5:Venous Leg Ulcer] [7:Venous Leg Ulcer] [N/A:N/A] Primary Etiology: [5:Cataracts, Anemia, Arrhythmia,] [7:Cataracts, Anemia, Arrhythmia,] [N/A:N/A] Comorbid History: [5:Congestive Heart Failure, Hypertension, Peripheral Venous Disease, Received Radiation 03/21/2020] [7:Congestive Heart Failure, Hypertension, Peripheral Venous Disease, Received Radiation 05/30/2020] [N/A:N/A] Date Acquired: [5:19] [7:9] [N/A:N/A] Weeks of Treatment: [5:Open] [7:Open] [N/A:N/A] Wound Status: [5:No] [7:Yes] [N/A:N/A] Clustered Wound: [5:N/A] [7:2] [N/A:N/A] Clustered Quantity: [5:4.5x3.5x0.1] [7:2.2x2.5x0.2] [N/A:N/A] Measurements L x W x D (cm) [5:12.37] [7:4.32] [N/A:N/A] A (cm) : rea [5:1.237] [7:0.864] [N/A:N/A] Volume (cm) : [5:-2359.20%] [7:-2651.60%] [N/A:N/A] % Reduction in Area: [5:-2374.00%] [7:-5300.00%] [N/A:N/A] % Reduction in Volume: [5:Full Thickness Without Exposed] [7:Full Thickness Without Exposed] [N/A:N/A] Classification: [5:Support Structures Medium] [7:Support Structures Medium] [N/A:N/A] Exudate Amount: [5:Serosanguineous] [7:Serosanguineous] [N/A:N/A] Exudate Type: [5:red, brown] [7:red, brown] [N/A:N/A] Exudate Color: [5:Flat and Intact] [7:Flat and Intact] [N/A:N/A] Wound Margin: [5:Medium (34-66%)] [7:Small (1-33%)] [N/A:N/A] Granulation Amount: [5:Pink] [7:Pink] [N/A:N/A] Granulation Quality: [5:Medium (34-66%)] [7:Large (67-100%)] [N/A:N/A] Necrotic Amount: [5:Fat Layer (Subcutaneous Tissue): Yes Fat Layer (Subcutaneous Tissue): Yes N/A] Exposed Structures: [5:Fascia: No Tendon: No Muscle: No Joint: No Bone: No Small (1-33%)] [7:Fascia: No Tendon: No Muscle: No Joint: No Bone: No Small (1-33%)] [N/A:N/A] Epithelialization: [5:Debridement - Excisional] [7:Debridement - Excisional] [N/A:N/A] Debridement: [5:09:12] [7:09:12] [N/A:N/A] Pre-procedure Verification/Time Out Taken: [5:Subcutaneous, Slough]  [7:Subcutaneous, Slough] [N/A:N/A] Tissue Debrided: [5:Skin/Subcutaneous Tissue] [7:Skin/Subcutaneous Tissue] [N/A:N/A] Level: [5:15.75] [7:5.5] [N/A:N/A] Debridement A (sq cm): [5:rea Curette] [7:Curette] [N/A:N/A] Instrument: [5:Minimum] [7:Minimum] [N/A:N/A] Bleeding: [5:Pressure] [7:Pressure] [N/A:N/A] Hemostasis A chieved: [5:3] [7:3] [N/A:N/A] Procedural Pain: [5:0] [7:0] [N/A:N/A] Post Procedural Pain: [5:Procedure was tolerated well] [7:Procedure was tolerated well] [N/A:N/A] Debridement Treatment Response: [5:4.5x3.5x0.1] [7:2.2x2.5x0.2] [N/A:N/A] Post Debridement Measurements L x W x D (cm) [5:1.237] [7:0.864] [N/A:N/A] Post Debridement Volume: (cm) [5:Cellular or Tissue Based Product] [7:Cellular or Tissue Based Product] [N/A:N/A] Procedures Performed: [5:Compression Therapy Debridement] [7:Compression Therapy Debridement] Treatment Notes Wound #5 (Left, Lateral Lower Leg) 1. Cleanse With Wound Cleanser Soap and water 3. Primary Dressing Applied Cellular Based Tissue Product 4. Secondary Dressing ABD Pad Dry Gauze 6. Support Layer Applied 3 layer compression wrap Notes apligraft applied by MD. steri-strips/adaptic over. Wound #7 (Left, Distal, Lateral Lower Leg) 1. Cleanse With Wound Cleanser Soap and water 3. Primary Dressing Applied Cellular Based Tissue Product 4. Secondary Dressing ABD Pad Dry Gauze 6. Support Layer Applied 3 layer compression wrap Notes apligraft applied by MD. steri-strips/adaptic over. Electronic Signature(s) Signed: 08/04/2020 4:21:43 PM By: Tracy Hurst RN, BSN Signed: 08/04/2020 4:33:08 PM By: Linton Ham MD Entered By: Linton Ham on 08/04/2020 09:51:10 -------------------------------------------------------------------------------- Multi-Disciplinary Care Plan Details Patient Name: Date of Service: [place], [name] 08/04/2020 8:30 A M Medical Record Number: [id number]  Patient Account Number: [id number] Date of  Birth/Sex: Treating RN: 1933/09/17 (86 y.o. [name] Primary Care Madden Garron: [name] Other Clinician: Referring Ledora Delker: Treating Sharada Albornoz/Extender: [name] Weeks in Treatment: 32 Active Inactive Wound/Skin Impairment Nursing Diagnoses: Knowledge deficit related to ulceration/compromised skin integrity Goals: Patient/caregiver will verbalize understanding of skin care regimen Date Initiated: 12/20/2019 Target Resolution Date: 08/22/2020 Goal Status: Active Interventions: Assess patient/caregiver ability to perform ulcer/skin care regimen upon admission and as needed Provide education on ulcer and skin care Treatment Activities: Skin care regimen initiated : 12/20/2019 Topical wound management initiated : 12/20/2019 Notes: Electronic Signature(s) Signed: 08/04/2020 4:21:43 PM By: Tracy Hurst RN, BSN Entered By: [name] on 08/04/2020 09:34:33 -------------------------------------------------------------------------------- Pain Assessment Details Patient Name: Date of Service: [name]. 08/04/2020 8:30 A M Medical Record Number: [id number] Patient Account Number: [id number] Date of Birth/Sex: Treating RN: Feb 09, 1933 (86 y.o. [name] Primary Care Leontyne Manville: [name] Other Clinician: Referring Maddalena Linarez: Treating Admiral Marcucci/Extender: [name] in Treatment: 32 Active Problems Location of Pain Severity and Description of Pain Patient Has Paino Yes Site Locations Rate the pain. Current Pain Level: 6 Pain Management and Medication Current Pain Management: Electronic Signature(s) Signed: 08/04/2020 2:53:33 PM By: [name] Signed: 08/04/2020 4:21:43 PM By: Tracy Hurst RN, BSN Entered By: [name] on 08/04/2020 08:37:37 -------------------------------------------------------------------------------- Patient/Caregiver Education Details Patient Name: Date of  Service: [place], [name] 8/23/2021andnbsp8:30 A M Medical Record Number: [id number] Patient Account Number: [id number] Date of Birth/Gender: Treating RN: 08-19-1933 (86 y.o. [name] Primary Care Physician: [name] Other Clinician: Referring Physician: Treating Physician/Extender: [name] in Treatment: 35 Education Assessment Education Provided To: Patient Education Topics Provided Wound/Skin Impairment: Methods: Explain/Verbal Responses: State content correctly [employer]) Signed: 08/04/2020 4:21:43 PM By: Tracy Hurst RN, BSN Entered By: [name] on 08/04/2020 09:34:46 -------------------------------------------------------------------------------- Wound Assessment Details Patient Name: Date of Service: [name]. 08/04/2020 8:30 A M Medical Record Number: [id number] Patient Account Number: [id number] Date of Birth/Sex: Treating RN: 04/01/1933 (86 y.o. [name] Primary Care Asuzena Weis: [name] Other Clinician: Referring Emnet Monk: Treating Kariann Wecker/Extender: [name] Weeks in Treatment: 32 Wound Status Wound Number: 5 Primary Venous Leg Ulcer Etiology: Wound Location: Left, Lateral Lower Leg Wound Open Wounding Event: Gradually Appeared Status: Date Acquired: 03/21/2020 Comorbid Cataracts, Anemia, Arrhythmia, Congestive Heart Failure, Weeks Of Treatment: 19 History: Hypertension, Peripheral Venous Disease, Received Radiation Clustered Wound: No Wound Measurements Length: (cm) 4.5 Width: (cm) 3.5 Depth: (cm) 0.1 Area: (cm) 12.37 Volume: (cm) 1.237 % Reduction in Area: -2359.2% % Reduction in Volume: -2374% Epithelialization: Small (1-33%) Undermining: No Wound Description Classification: Full Thickness Without Exposed Support Structures Wound Margin: Flat and Intact Exudate Amount: Medium Exudate Type: Serosanguineous Exudate Color: red,  brown Foul Odor After Cleansing: No Slough/Fibrino Yes Wound Bed Granulation Amount: Medium (34-66%) Exposed Structure Granulation Quality: Pink Fascia Exposed: No Necrotic Amount: Medium (34-66%) Fat Layer (Subcutaneous Tissue) Exposed: Yes Necrotic Quality: Adherent Slough Tendon Exposed: No Muscle Exposed: No Joint Exposed: No Bone Exposed: No Treatment Notes Wound #5 (Left, Lateral Lower Leg) 1. Cleanse With Wound Cleanser Soap and water 3. Primary Dressing Applied Cellular Based Tissue Product 4. Secondary Dressing ABD Pad Dry Gauze 6. Support Layer Applied 3 layer compression wrap Notes apligraft applied by MD. steri-strips/adaptic over. Electronic Signature(s) Signed: 08/04/2020 4:21:43 PM By: Tracy Hurst RN, BSN Entered By: [name] on 08/04/2020 09:01:01 --------------------------------------------------------------------------------  Wound Assessment Details Patient Name: Date of Service: [place], [name] 08/04/2020 8:30 A M Medical Record Number: [id number] Patient Account Number: [id number] Date of Birth/Sex: Treating RN: 11-24-1933 (86 y.o. [name] Primary Care Thressa Shiffer: [name] Other Clinician: Referring Berkley Cronkright: Treating Gabrial Poppell/Extender: [name] Weeks in Treatment: 32 Wound Status Wound Number: 7 Primary Venous Leg Ulcer Etiology: Wound Location: Left, Distal, Lateral Lower Leg Wound Open Wounding Event: Gradually Appeared Status: Date Acquired: 05/30/2020 Comorbid Cataracts, Anemia, Arrhythmia, Congestive Heart Failure, Weeks Of Treatment: 9 History: Hypertension, Peripheral Venous Disease, Received Radiation Clustered Wound: Yes Wound Measurements Length: (cm) 2.2 Width: (cm) 2.5 Depth: (cm) 0.2 Clustered Quantity: 2 Area: (cm) 4.32 Volume: (cm) 0.864 % Reduction in Area: -2651.6% % Reduction in Volume: -5300% Epithelialization: Small (1-33%) Tunneling: No Undermining: No Wound  Description Classification: Full Thickness Without Exposed Support Structures Wound Margin: Flat and Intact Exudate Amount: Medium Exudate Type: Serosanguineous Exudate Color: red, brown Wound Bed Granulation Amount: Small (1-33%) Granulation Quality: Pink Necrotic Amount: Large (67-100%) Necrotic Quality: Adherent Slough Foul Odor After Cleansing: No Slough/Fibrino Yes Exposed Structure Fascia Exposed: No Fat Layer (Subcutaneous Tissue) Exposed: Yes Tendon Exposed: No Muscle Exposed: No Joint Exposed: No Bone Exposed: No Treatment Notes Wound #7 (Left, Distal, Lateral Lower Leg) 1. Cleanse With Wound Cleanser Soap and water 3. Primary Dressing Applied Cellular Based Tissue Product 4. Secondary Dressing ABD Pad Dry Gauze 6. Support Layer Applied 3 layer compression wrap Notes apligraft applied by MD. steri-strips/adaptic over. Electronic Signature(s) Signed: 08/04/2020 4:21:43 PM By: Tracy Hurst RN, BSN Entered By: [name] on 08/04/2020 09:01:15 -------------------------------------------------------------------------------- [place] Details Patient Name: Date of Service: [name], [place]. 08/04/2020 8:30 A M Medical Record Number: [id number] Patient Account Number: [id number] Date of Birth/Sex: Treating RN: 1933-01-28 (86 y.o. [name] Primary Care Kiernan Farkas: [name] Other Clinician: Referring Luqman Perrelli: Treating Mardy Lucier/Extender: [name] in Treatment: 32 Vital Signs Time Taken: 08:37 Temperature (F): 97.6 Height (in): 69 Pulse (bpm): 66 Weight (lbs): 163 Respiratory Rate (breaths/min): 18 Body Mass Index (BMI): 24.1 Blood Pressure (mmHg): 131/77 Reference Range: 80 - 120 mg / dl Electronic Signature(s) Signed: 08/04/2020 2:53:33 PM By: [name] Entered By: [name] on 08/04/2020 08:37:26

## 2020-08-05 NOTE — Progress Notes (Signed)
Green, Tracy ([id number]) Visit Report for 08/04/2020 Cellular or Tissue Based Product Details Patient Name: Date of Service: [place], [name] 08/04/2020 8:30 A M Medical Record Number: [id number] Patient Account Number: [id number] Date of Birth/Sex: Treating RN: [birth year] (86 y.o. [name] Primary Care Provider: Patrina Levering Other Clinician: Referring Provider: Treating Provider/Extender: Evelena Peat in Treatment: 32 Cellular or Tissue Based Product Type Wound #5 Left,Lateral Lower Leg Applied to: Performed By: Physician Ricard Dillon., MD Cellular or Tissue Based Product Type: Apligraf Level of Consciousness (Pre-procedure): Awake and Alert Pre-procedure Verification/Time Out Yes - 09:20 Taken: Location: trunk / arms / legs Wound Size (sq cm): 15.75 Product Size (sq cm): 22 Waste Size (sq cm): 0 Amount of Product Applied (sq cm): 22 Lot #: [id number] Order #: 4 Expiration Date: 08/09/2020 Fenestrated: Yes Instrument: Blade Reconstituted: Yes Solution Type: normal saline Solution Amount: 10 ml Lot #: [id number] Solution Expiration Date: 08/12/2021 Secured: Yes Secured With: Steri-Strips Dressing Applied: Yes Primary Dressing: adaptic, gauze, compression wrap Procedural Pain: 0 Post Procedural Pain: 0 Response to Treatment: Procedure was tolerated well Level of Consciousness (Post- Awake and Alert procedure): Post Procedure Diagnosis Same as Pre-procedure Electronic Signature(s) Signed: 08/04/2020 4:33:08 PM By: Linton Ham MD Entered By: Linton Ham on 08/04/2020 09:51:30 -------------------------------------------------------------------------------- Cellular or Tissue Based Product Details Patient Name: Date of Service: [name]. 08/04/2020 8:30 A M Medical Record Number: [id number] Patient Account Number: [id number] Date of Birth/Sex: Treating RN: 1933-03-12 (86 y.o. [name] Primary Care  Provider: Patrina Levering Other Clinician: Referring Provider: Treating Provider/Extender: Evelena Peat in Treatment: 32 Cellular or Tissue Based Product Type Wound #7 Left,Distal,Lateral Lower Leg Applied to: Performed By: Physician Ricard Dillon., MD Cellular or Tissue Based Product Type: Apligraf Level of Consciousness (Pre-procedure): Awake and Alert Pre-procedure Verification/Time Out Yes - 09:20 Taken: Location: trunk / arms / legs Wound Size (sq cm): 5.5 Product Size (sq cm): 22 Waste Size (sq cm): 0 Amount of Product Applied (sq cm): 22 Lot #: [id number] Order #: 4 Expiration Date: 08/09/2020 Fenestrated: Yes Instrument: Blade Reconstituted: Yes Solution Type: normal saline Solution Amount: 10 ml Lot #: [id number] Solution Expiration Date: 08/12/2021 Secured: Yes Secured With: Steri-Strips Dressing Applied: Yes Primary Dressing: adaptic, gauze, compression wrap Procedural Pain: 0 Post Procedural Pain: 0 Response to Treatment: Procedure was tolerated well Level of Consciousness (Post- Awake and Alert procedure): Post Procedure Diagnosis Same as Pre-procedure Electronic Signature(s) Signed: 08/04/2020 4:33:08 PM By: Linton Ham MD Entered By: Linton Ham on 08/04/2020 09:51:49 -------------------------------------------------------------------------------- Debridement Details Patient Name: Date of Service: [name]. 08/04/2020 8:30 A M Medical Record Number: [id number] Patient Account Number: [id number] Date of Birth/Sex: Treating RN: Aug 28, 1933 (86 y.o. [name] Primary Care Provider: Patrina Levering Other Clinician: Referring Provider: Treating Provider/Extender: Evelena Peat in Treatment: 32 Debridement Performed for Assessment: Wound #5 Left,Lateral Lower Leg Performed By: Physician Ricard Dillon., MD Debridement Type: Debridement Severity of Tissue Pre Debridement:  Fat layer exposed Level of Consciousness (Pre-procedure): Awake and Alert Pre-procedure Verification/Time Out Yes - 09:12 Taken: Start Time: 09:12 T Area Debrided (L x W): otal 4.5 (cm) x 3.5 (cm) = 15.75 (cm) Tissue and other material debrided: Viable, Non-Viable, Slough, Subcutaneous, Slough Level: Skin/Subcutaneous Tissue Debridement Description: Excisional Instrument: Curette Bleeding: Minimum Hemostasis Achieved: Pressure End Time: 09:14 Procedural Pain: 3 Post Procedural Pain: 0 Response to Treatment: Procedure was tolerated well  Level of Consciousness (Post- Awake and Alert procedure): Post Debridement Measurements of Total Wound Length: (cm) 4.5 Width: (cm) 3.5 Depth: (cm) 0.1 Volume: (cm) 1.237 Character of Wound/Ulcer Post Debridement: Improved Severity of Tissue Post Debridement: Fat layer exposed Post Procedure Diagnosis Same as Pre-procedure Electronic Signature(s) Signed: 08/04/2020 4:21:43 PM By: [name] RN, BSN Signed: 08/04/2020 4:33:08 PM By: Linton Ham MD Entered By: Linton Ham on 08/04/2020 09:51:20 -------------------------------------------------------------------------------- Debridement Details Patient Name: Date of Service: [name]. 08/04/2020 8:30 A M Medical Record Number: [id number] Patient Account Number: [id number] Date of Birth/Sex: Treating RN: Nov 17, 1933 (86 y.o. [name] Primary Care Provider: Lajean Manes T Other Clinician: Referring Provider: Treating Provider/Extender: Evelena Peat in Treatment: 32 Debridement Performed for Assessment: Wound #7 Left,Distal,Lateral Lower Leg Performed By: Physician Ricard Dillon., MD Debridement Type: Debridement Severity of Tissue Pre Debridement: Fat layer exposed Level of Consciousness (Pre-procedure): Awake and Alert Pre-procedure Verification/Time Out Yes - 09:12 Taken: Start Time: 09:12 T Area Debrided (L x W): otal 2.2  (cm) x 2.5 (cm) = 5.5 (cm) Tissue and other material debrided: Viable, Non-Viable, Slough, Subcutaneous, Slough Level: Skin/Subcutaneous Tissue Debridement Description: Excisional Instrument: Curette Bleeding: Minimum Hemostasis Achieved: Pressure End Time: 09:14 Procedural Pain: 3 Post Procedural Pain: 0 Response to Treatment: Procedure was tolerated well Level of Consciousness (Post- Awake and Alert procedure): Post Debridement Measurements of Total Wound Length: (cm) 2.2 Width: (cm) 2.5 Depth: (cm) 0.2 Volume: (cm) 0.864 Character of Wound/Ulcer Post Debridement: Improved Severity of Tissue Post Debridement: Fat layer exposed Post Procedure Diagnosis Same as Pre-procedure Electronic Signature(s) Signed: 08/04/2020 4:21:43 PM By: [name] RN, BSN Signed: 08/04/2020 4:33:08 PM By: Linton Ham MD Entered By: Linton Ham on 08/04/2020 09:51:39 -------------------------------------------------------------------------------- HPI Details Patient Name: Date of Service: [name]. 08/04/2020 8:30 A M Medical Record Number: [id number] Patient Account Number: [id number] Date of Birth/Sex: Treating RN: [birth year] (86 y.o. [name] Primary Care Provider: Patrina Levering Other Clinician: Referring Provider: Treating Provider/Extender: Evelena Peat in Treatment: 32 History of Present Illness HPI Description: ADMISSION 12/20/2019 This is an 86 year old woman referred by her primary physician Dr. Felipa Eth for review of a wound on her right lateral lower leg. She was actually in this clinic on 2 separate occasions in 2010 and 2012 cared for by Dr. Sherilyn Cooter. At that point in time she had wounds on her right leg as well. She tells [place] to 1 month ago she noticed a scab building up on her right lateral lower leg this opened into a wound. There was no overt cause of this no trauma, no infection that she is aware of. She has a history of  chronic venous insufficiency and wears compression stockings fairly religiously indeed she is done well over the last 8 years since she was last in this clinic. She has been applying Vaseline on this and a covering phone. This is not progressing towards healing. Past medical history; interstitial lung disease, chronic atrial fibrillation status post pacemaker, osteoarthritis of the left knee, left breast CA, chronic repeat venous insufficiency. She takes Eliquis for her atrial fibrillation stroke prophylaxis. ABI in our clinic was 0.99 on the right 1/15; superficial wound on the right lateral calf in the setting of severe acute skin changes from chronic venous insufficiency and lymphedema. We used Iodoflex last week she complained of a lot of pain 1/22; this is a small but difficult wound on the right lateral calf in the  setting of severe chronic venous insufficiency and secondary lymphedema. She continues to state the wounds things hurts when she is up on it but seems to be relieved by putting her leg up. I changed her to [facility] last week because the Iodoflex seem to be causing stinging. 1/29. This wound appears to be contracting somewhat. Changes of chronic venous insufficiency with secondary lymphedema 2/12; not as good as surface today and slightly bigger. I had to change her from Iodoflex to [facility] because of the stinging pain although she does not think it was any better on the [facility]. She comes into clinic today with a area on the medial left great toe. She said she noticed blood on her sock last Saturday. She had some form of bunion surgery by Dr. Ila Mcgill her podiatrist sometime in 2019 she said he "shave the area". I could not find his operative report although I did see reference to the bunion in the area. 2/19; somewhat improved wound on the right lateral lower leg however the wound she came in on the bunion of her left great toe is actually I think larger  still somewhat inflamed. We have been using Iodoflex 2/26; not much improvement in either wound area which is the original venous wound on the right lateral lower leg and in the area on the bunion of her left great toe. This still looks somewhat inflamed and tender. We have been using Iodoflex with open much improvement. 3/4; in general both her wounds look better this includes the original venous insufficiency wound on the right lateral lower leg and the area on her tip of her bunion on the left great toe medial aspect of the MTP. Change to Hydrofera Blue last week 3/12; the patient still has a small geographic shaped wound on the right lateral lower leg. We have been using Hydrofera Blue but I changed her to endoform today. She also has the area in the tip of the bunion of the left great toe. We will try Hydrofera Blue here as well 3/19; the small geographic wound on the right lateral lower leg has not filled in. There is some surrounding induration we have noticed this previously. This may be venous inflammation but I wonder about biopsying this if this is not closing up. The area over the left medial first MTP [bunion deformity] requires debridement. This is not closing in. I am not convinced she is offloading adequately 3/26; right lateral lower leg in the setting of severe venous insufficiency and also an area over the first MTP bunion deformity. No debridement is required. We have been using endoform 4/9; right lateral lower leg wound in the setting of severe venous insufficiency and also a refractory area over the first MTP bunion deformity. The area on the right lateral lower leg is just about closed there is still a minor open area here. She comes in today with a mirror image area on the left lateral lower leg. She says this started as a scab 2 weeks ago and is gradually morphed into an open wound very similar to what she has on the right leg. She has severe bilateral venous insufficiency  with venous hypertension obvious from clinical exam. 4/16; her original wound the right lateral leg wound in the setting of chronic venous hypertension is a small open area but very small. We have been using endoform. Her wound over the left first MTP bunion deformity also appears to be small and closing in. Unfortunately she has  a large relative area on the left lateral calf which was new last week. Very adherent debris over this we have been using Iodoflex however that may be contributing to the debris on the wound surface 4/23; the original wound is closed and the area on the left first metatarsal bunion deformity is also almost closed the new area from last week required debridement. She went for her reflux studies that did not take her lower extremity wraps off. This is not helpful. She did have significant reflux in the right common femoral vein. I am not going to press this issue further. She clinically has severe venous hypertension 4/29; the original wound is closed on the right lateral lower calf. The area on the left first metatarsal/bunion deformity has a small slitlike opening that is still not closed. The real problem here is now wound on the left lateral calf which is necrotic and deep. We used Iodoflex on this wound last time. Silver alginate on the left first toe The patient has [place] wraps. We should be able to transition the right leg into compression stockings and were doing this today. 5/7; the original wound remains closed on the right lateral calf. The left first metatarsal head still is open. Deterioration in the wound which is the new wound from several weeks ago on the left lateral calf. The patient is not aware how this could have happened. We have been using Sorbact starting last week under 3 layer compression 5/14; the right lateral calf is closed the area on the first met head on the left is closed However the new area from 3 weeks ago on the left lateral calf  continues to expand. There is raised nodular tissue around the wound necrotic debris on the surface. Circumference of the wound also looks necrotic. It looks as though there is involvement of the tissue under the skin around the large area of this wound which looks threatened. She is complaining of pain. Culture of this wound I did last week showed rare coag negative staph variant I have never heard of although I am doubtful this has clinical significance 05/09/20-Patient returns with a left lateral calf wound looking about the same, the biopsies reviewed show no atypical features and consistent with trauma or pressure injury or stasis change, patient has appointment to be scheduled with vascular We are using silver alginate and 3 layer compression Patient was started on Trental by dermatology which she has been taking for a week now 6/4; left lateral calf wound. I reviewed the dermatology notes with Dr. Martin Majestic from [facility] dermatology. 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 Put her on pentoxifylline noteworthy that she is on Eliquis as well. We have been using silver alginate under compression. She has an appointment for repeat vascular studies on 7/6. This gets back to the fact that they did not take the wrap off on the original studies that were done. I do not believe she has an arterial issue. 6/10; left lateral calf wound. I put her on Hydrofera Blue last week. Some improvement in the surface condition of the wound. She has repeat vascular studies/reflux studies on 7/6. We are trying to get Apligraf through [place] [employer]. Her husband states he looked on the website this is not going to be approved. I am really not sure if this is a "class effect" 6/18; somewhat surprisingly Apligraf was approved and 100% covered. Her husband was also quite  shocked by this. Nevertheless we have her  repeat vascular studies on 7/6 and I will not be able to apply this until this is done. We are using Hydrofera Blue to the wound on the left. Her original wound on the right lower leg has maintain closure using compression stocking 6/25; repeat vascular studies/venous on 7/6; we will apply the Apligraf I think that week. We are also going to work around a week's vacation they have the first week in August. 7/2; reflux studies on 7/6;. Likely place first Apligraf from the major wound area on 7/9; 7/9; patient was kindly seen by Dr. Arbie Cookey of vein and vascular to review her circulation status with the refractory wounds in the left leg. At first wounds in this clinic were on the right leg and they heal she now has a juxta lite stocking. Dr. Arbie Cookey thought she had venous stasis disease with venous ulcerations. He did not feel that there was anything that would suggest to expect approval with a blush ablation of her saphenous vein the saphenous veins were felt to be relatively small in caliber. She was not felt to have an arterial issue Apligraf #1 apply today in the standard fashion 7/23; the patient came in today complaining of a lot of pain in the left lower leg where the wound is. We applied Apligraf 2 weeks ago I also increased her compression from 3-4 layer wondering about the venous hypertension. She arrives today with her original wound that we have been treating with Apligraf looking quite healthy although there is some surface slough. Problematically she has de-epithelialized and developed erythema below the wound towards the lateral malleolus itself. This is somewhat angry. I think this is venous stasis loss of epithelialization. I do not believe this is infection . Apligraf #2 applied to the original wound area 8/9-Patient returns at 2 weeks for Apligraf #3, the left leg venous leg ulcer area had more slough this time 8/23 Apligraf #4 her wound areas are small however she is still complaining  of pain. She has previously been seen by vein and vascular. She has not felt to have an arterial issue she was felt to have venous stasis disease. She originally came here with a wound on her right leg on the right lateral calf this healed out. She then developed a difficult area on the left. She saw dermatology wondered whether this could be a vasculopathy. I am not sure if she is still on pentoxifylline the dermatology suggested I will have to clarify this with Electronic Signature(s) Signed: 08/04/2020 4:33:08 PM By: Baltazar Najjar MD Entered By: Baltazar Najjar on 08/04/2020 09:54:07 -------------------------------------------------------------------------------- Physical Exam Details Patient Name: Date of Service: Nelda Marseille. 08/04/2020 8:30 A M Medical Record Number: [id number] Patient Account Number: [id number] Date of Birth/Sex: Treating RN: 15-May-1933 (86 y.o. Wynelle Link Primary Care Provider: Estrellita Ludwig Other Clinician: Referring Provider: Treating Provider/Extender: Addison Naegeli Weeks in Treatment: 32 Constitutional Sitting or standing Blood Pressure is within target range for patient.. Pulse regular and within target range for patient.Marland Kitchen Respirations regular, non-labored and within target range.. Temperature is normal and within the target range for the patient.Marland Kitchen Appears in no distress. Notes Wound exam; left lateral venous ulcer with slough. There are 2 areas that are most prominent. She also has small areas in between the cobblestoned areas towards her ankle. The major areas require debridement of adherent surface slough I did this with a #3 curette I then applied Apligraf in  the standard fashion Electronic Signature(s) Signed: 08/04/2020 4:33:08 PM By: Linton Ham MD Entered By: Linton Ham on 08/04/2020 09:55:03 -------------------------------------------------------------------------------- Physician Orders Details Patient Name:  Date of Service: [name]. 08/04/2020 8:30 A M Medical Record Number: [id number] Patient Account Number: [id number] Date of Birth/Sex: Treating RN: Aug 05, 1933 (86 y.o. [name] Primary Care Provider: Other Clinician: Lajean Manes T Referring Provider: Treating Provider/Extender: Evelena Peat in Treatment: 56 Verbal / Phone Orders: No Diagnosis Coding ICD-10 Coding Code Description I87.321 Chronic venous hypertension (idiopathic) with inflammation of right lower extremity L97.828 Non-pressure chronic ulcer of other part of left lower leg with other specified severity I87.322 Chronic venous hypertension (idiopathic) with inflammation of left lower extremity L97.522 Non-pressure chronic ulcer of other part of left foot with fat layer exposed Follow-up Appointments ppointment in 2 weeks. - MD visit Return A Nurse Visit: - 1 week Dressing Change Frequency Do not change entire dressing for one week. Skin Barriers/Peri-Wound Care Barrier cream Moisturizing lotion TCA Cream or Ointment - to red/inflamed areas Wound Cleansing May shower with protection. - cast protector Primary Wound Dressing Wound #5 Left,Lateral Lower Leg pplication - Apligraft #4 Skin Substitute A Wound #7 Left,Distal,Lateral Lower Leg pplication - Apligraft #4 Skin Substitute A Secondary Dressing Wound #5 Left,Lateral Lower Leg daptic Dressing - secure with steri-strips A Dry Gauze ABD pad Wound #7 Left,Distal,Lateral Lower Leg daptic Dressing - secure with steri-strips A Dry Gauze ABD pad Edema Control 3 Layer Compression System - Left Lower Extremity Avoid standing for long periods of time Elevate legs to the level of the heart or above for 30 minutes daily and/or when sitting, a frequency of: - throughout the day. Exercise regularly Support Garment 20-30 mm/Hg pressure to: - patient to apply farrow wrap 4000 to right leg. Apply in the morning and remove  at night. Off-Loading Open toe surgical shoe to: - left foot Electronic Signature(s) Signed: 08/04/2020 4:21:43 PM By: [name] RN, BSN Signed: 08/04/2020 4:33:08 PM By: Linton Ham MD Entered By: [name] on 08/04/2020 09:23:33 -------------------------------------------------------------------------------- Problem List Details Patient Name: Date of Service: [name]. 08/04/2020 8:30 A M Medical Record Number: [id number] Patient Account Number: [id number] Date of Birth/Sex: Treating RN: April 08, 1933 (86 y.o. [name] Primary Care Provider: Lajean Manes T Other Clinician: Referring Provider: Treating Provider/Extender: Evelena Peat in Treatment: 32 Active Problems ICD-10 Encounter Code Description Active Date MDM Diagnosis I87.321 Chronic venous hypertension (idiopathic) with inflammation of right lower 12/20/2019 No Yes extremity L97.828 Non-pressure chronic ulcer of other part of left lower leg with other specified 03/21/2020 No Yes severity I87.322 Chronic venous hypertension (idiopathic) with inflammation of left lower 02/14/2020 No Yes extremity L97.522 Non-pressure chronic ulcer of other part of left foot with fat layer exposed 01/25/2020 No Yes Inactive Problems ICD-10 Code Description Active Date Inactive Date L97.812 Non-pressure chronic ulcer of other part of right lower leg with fat layer exposed 12/20/2019 12/20/2019 Resolved Problems Electronic Signature(s) Signed: 08/04/2020 4:33:08 PM By: Linton Ham MD Entered By: Linton Ham on 08/04/2020 09:51:03 -------------------------------------------------------------------------------- Progress Note Details Patient Name: Date of Service: [name]. 08/04/2020 8:30 A M Medical Record Number: [id number] Patient Account Number: [id number] Date of Birth/Sex: Treating RN: [birth year] (86 y.o. [name] Primary Care Provider: Patrina Levering Other  Clinician: Referring Provider: Treating Provider/Extender: Evelena Peat in Treatment: 32 Subjective History of Present Illness (HPI) ADMISSION 12/20/2019 This is an 86 year old  woman referred by her primary physician Dr. Felipa Eth for review of a wound on her right lateral lower leg. She was actually in this clinic on 2 separate occasions in 2010 and 2012 cared for by Dr. Sherilyn Cooter. At that point in time she had wounds on her right leg as well. She tells [place] to 1 month ago she noticed a scab building up on her right lateral lower leg this opened into a wound. There was no overt cause of this no trauma, no infection that she is aware of. She has a history of chronic venous insufficiency and wears compression stockings fairly religiously indeed she is done well over the last 8 years since she was last in this clinic. She has been applying Vaseline on this and a covering phone. This is not progressing towards healing. Past medical history; interstitial lung disease, chronic atrial fibrillation status post pacemaker, osteoarthritis of the left knee, left breast CA, chronic repeat venous insufficiency. She takes Eliquis for her atrial fibrillation stroke prophylaxis. ABI in our clinic was 0.99 on the right 1/15; superficial wound on the right lateral calf in the setting of severe acute skin changes from chronic venous insufficiency and lymphedema. We used Iodoflex last week she complained of a lot of pain 1/22; this is a small but difficult wound on the right lateral calf in the setting of severe chronic venous insufficiency and secondary lymphedema. She continues to state the wounds things hurts when she is up on it but seems to be relieved by putting her leg up. I changed her to [facility] last week because the Iodoflex seem to be causing stinging. 1/29. This wound appears to be contracting somewhat. Changes of chronic venous insufficiency with secondary lymphedema 2/12;  not as good as surface today and slightly bigger. I had to change her from Iodoflex to [facility] because of the stinging pain although she does not think it was any better on the [facility]. ooShe comes into clinic today with a area on the medial left great toe. She said she noticed blood on her sock last Saturday. She had some form of bunion surgery by Dr. Ila Mcgill her podiatrist sometime in 2019 she said he "shave the area". I could not find his operative report although I did see reference to the bunion in the area. 2/19; somewhat improved wound on the right lateral lower leg however the wound she came in on the bunion of her left great toe is actually I think larger still somewhat inflamed. We have been using Iodoflex 2/26; not much improvement in either wound area which is the original venous wound on the right lateral lower leg and in the area on the bunion of her left great toe. This still looks somewhat inflamed and tender. We have been using Iodoflex with open much improvement. 3/4; in general both her wounds look better this includes the original venous insufficiency wound on the right lateral lower leg and the area on her tip of her bunion on the left great toe medial aspect of the MTP. Change to Hydrofera Blue last week 3/12; the patient still has a small geographic shaped wound on the right lateral lower leg. We have been using Hydrofera Blue but I changed her to endoform today. She also has the area in the tip of the bunion of the left great toe. We will try Hydrofera Blue here as well 3/19; the small geographic wound on the right lateral lower leg has not filled in. There is  some surrounding induration we have noticed this previously. This may be venous inflammation but I wonder about biopsying this if this is not closing up. The area over the left medial first MTP [bunion deformity] requires debridement. This is not closing in. I am not convinced she is offloading  adequately 3/26; right lateral lower leg in the setting of severe venous insufficiency and also an area over the first MTP bunion deformity. No debridement is required. We have been using endoform 4/9; right lateral lower leg wound in the setting of severe venous insufficiency and also a refractory area over the first MTP bunion deformity. The area on the right lateral lower leg is just about closed there is still a minor open area here. She comes in today with a mirror image area on the left lateral lower leg. She says this started as a scab 2 weeks ago and is gradually morphed into an open wound very similar to what she has on the right leg. She has severe bilateral venous insufficiency with venous hypertension obvious from clinical exam. 4/16; her original wound the right lateral leg wound in the setting of chronic venous hypertension is a small open area but very small. We have been using endoform. Her wound over the left first MTP bunion deformity also appears to be small and closing in. Unfortunately she has a large relative area on the left lateral calf which was new last week. Very adherent debris over this we have been using Iodoflex however that may be contributing to the debris on the wound surface 4/23; the original wound is closed and the area on the left first metatarsal bunion deformity is also almost closed the new area from last week required debridement. She went for her reflux studies that did not take her lower extremity wraps off. This is not helpful. She did have significant reflux in the right common femoral vein. I am not going to press this issue further. She clinically has severe venous hypertension 4/29; the original wound is closed on the right lateral lower calf. The area on the left first metatarsal/bunion deformity has a small slitlike opening that is still not closed. The real problem here is now wound on the left lateral calf which is necrotic and deep. We used  Iodoflex on this wound last time. Silver alginate on the left first toe The patient has [place] wraps. We should be able to transition the right leg into compression stockings and were doing this today. 5/7; the original wound remains closed on the right lateral calf. The left first metatarsal head still is open. Deterioration in the wound which is the new wound from several weeks ago on the left lateral calf. The patient is not aware how this could have happened. We have been using Sorbact starting last week under 3 layer compression 5/14; the right lateral calf is closed the area on the first met head on the left is closed However the new area from 3 weeks ago on the left lateral calf continues to expand. There is raised nodular tissue around the wound necrotic debris on the surface. Circumference of the wound also looks necrotic. It looks as though there is involvement of the tissue under the skin around the large area of this wound which looks threatened. She is complaining of pain. Culture of this wound I did last week showed rare coag negative staph variant I have never heard of although I am doubtful this has clinical significance 05/09/20-Patient returns with a left lateral  calf wound looking about the same, the biopsies reviewed show no atypical features and consistent with trauma or pressure injury or stasis change, patient has appointment to be scheduled with vascular We are using silver alginate and 3 layer compression Patient was started on Trental by dermatology which she has been taking for a week now 6/4; left lateral calf wound. I reviewed the dermatology notes with Dr. Martin Majestic from [facility] dermatology. 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 Put her on pentoxifylline noteworthy that she is on Eliquis as well. We have been using silver alginate under compression. She has an appointment  for repeat vascular studies on 7/6. This gets back to the fact that they did not take the wrap off on the original studies that were done. I do not believe she has an arterial issue. 6/10; left lateral calf wound. I put her on Hydrofera Blue last week. Some improvement in the surface condition of the wound. She has repeat vascular studies/reflux studies on 7/6. We are trying to get Apligraf through [place] healthcare. Her husband states he looked on the website this is not going to be approved. I am really not sure if this is a "class effect" 6/18; somewhat surprisingly Apligraf was approved and 100% covered. Her husband was also quite shocked by this. Nevertheless we have her repeat vascular studies on 7/6 and I will not be able to apply this until this is done. We are using Hydrofera Blue to the wound on the left. Her original wound on the right lower leg has maintain closure using compression stocking 6/25; repeat vascular studies/venous on 7/6; we will apply the Apligraf I think that week. We are also going to work around a week's vacation they have the first week in August. 7/2; reflux studies on 7/6;. Likely place first Apligraf from the major wound area on 7/9; 7/9; patient was kindly seen by Dr. Donnetta Hutching of vein and vascular to review her circulation status with the refractory wounds in the left leg. At first wounds in this clinic were on the right leg and they heal she now has a juxta lite stocking. Dr. Donnetta Hutching thought she had venous stasis disease with venous ulcerations. He did not feel that there was anything that would suggest to expect approval with a blush ablation of her saphenous vein the saphenous veins were felt to be relatively small in caliber. She was not felt to have an arterial issue Apligraf #1 apply today in the standard fashion 7/23; the patient came in today complaining of a lot of pain in the left lower leg where the wound is. We applied Apligraf 2 weeks ago I also increased  her compression from 3-4 layer wondering about the venous hypertension. She arrives today with her original wound that we have been treating with Apligraf looking quite healthy although there is some surface slough. Problematically she has de-epithelialized and developed erythema below the wound towards the lateral malleolus itself. This is somewhat angry. I think this is venous stasis loss of epithelialization. I do not believe this is infection . Apligraf #2 applied to the original wound area 8/9-Patient returns at 2 weeks for Apligraf #3, the left leg venous leg ulcer area had more slough this time 8/23 Apligraf #4 her wound areas are small however she is still complaining of pain. She has previously been seen by vein and vascular. She has not felt to have an arterial issue she was  felt to have venous stasis disease. She originally came here with a wound on her right leg on the right lateral calf this healed out. She then developed a difficult area on the left. She saw dermatology wondered whether this could be a vasculopathy. I am not sure if she is still on pentoxifylline the dermatology suggested I will have to clarify this with Objective Constitutional Sitting or standing Blood Pressure is within target range for patient.. Pulse regular and within target range for patient.Marland Kitchen Respirations regular, non-labored and within target range.. Temperature is normal and within the target range for the patient.Marland Kitchen Appears in no distress. Vitals Time Taken: 8:37 AM, Height: 69 in, Weight: 163 lbs, BMI: 24.1, Temperature: 97.6 F, Pulse: 66 bpm, Respiratory Rate: 18 breaths/min, Blood Pressure: 131/77 mmHg. General Notes: Wound exam; left lateral venous ulcer with slough. There are 2 areas that are most prominent. She also has small areas in between the cobblestoned areas towards her ankle. The major areas require debridement of adherent surface slough I did this with a #3 curette I then applied Apligraf in  the standard fashion Integumentary (Hair, Skin) Wound #5 status is Open. Original cause of wound was Gradually Appeared. The wound is located on the Left,Lateral Lower Leg. The wound measures 4.5cm length x 3.5cm width x 0.1cm depth; 12.37cm^2 area and 1.237cm^3 volume. There is Fat Layer (Subcutaneous Tissue) exposed. There is no undermining noted. There is a medium amount of serosanguineous drainage noted. The wound margin is flat and intact. There is medium (34-66%) pink granulation within the wound bed. There is a medium (34-66%) amount of necrotic tissue within the wound bed including Adherent Slough. Wound #7 status is Open. Original cause of wound was Gradually Appeared. The wound is located on the Left,Distal,Lateral Lower Leg. The wound measures 2.2cm length x 2.5cm width x 0.2cm depth; 4.32cm^2 area and 0.864cm^3 volume. There is Fat Layer (Subcutaneous Tissue) exposed. There is no tunneling or undermining noted. There is a medium amount of serosanguineous drainage noted. The wound margin is flat and intact. There is small (1-33%) pink granulation within the wound bed. There is a large (67-100%) amount of necrotic tissue within the wound bed including Adherent Slough. Assessment Active Problems ICD-10 Chronic venous hypertension (idiopathic) with inflammation of right lower extremity Non-pressure chronic ulcer of other part of left lower leg with other specified severity Chronic venous hypertension (idiopathic) with inflammation of left lower extremity Non-pressure chronic ulcer of other part of left foot with fat layer exposed Procedures Wound #5 Pre-procedure diagnosis of Wound #5 is a Venous Leg Ulcer located on the Left,Lateral Lower Leg .Severity of Tissue Pre Debridement is: Fat layer exposed. There was a Excisional Skin/Subcutaneous Tissue Debridement with a total area of 15.75 sq cm performed by Ricard Dillon., MD. With the following instrument(s): Curette to remove Viable  and Non-Viable tissue/material. Material removed includes Subcutaneous Tissue and Slough and. No specimens were taken. A time out was conducted at 09:12, prior to the start of the procedure. A Minimum amount of bleeding was controlled with Pressure. The procedure was tolerated well with a pain level of 3 throughout and a pain level of 0 following the procedure. Post Debridement Measurements: 4.5cm length x 3.5cm width x 0.1cm depth; 1.237cm^3 volume. Character of Wound/Ulcer Post Debridement is improved. Severity of Tissue Post Debridement is: Fat layer exposed. Post procedure Diagnosis Wound #5: Same as Pre-Procedure Pre-procedure diagnosis of Wound #5 is a Venous Leg Ulcer located on the Left,Lateral Lower Leg. A skin  graft procedure using a bioengineered skin substitute/cellular or tissue based product was performed by Ricard Dillon., MD with the following instrument(s): N/A. Apligraf was applied and secured with Steri-Strips. 22 sq cm of product was utilized and 0 sq cm was wasted. Post Application, adaptic, gauze, compression wrap was applied. A Time Out was conducted at 09:20, prior to the start of the procedure. The procedure was tolerated well with a pain level of 0 throughout and a pain level of 0 following the procedure. Post procedure Diagnosis Wound #5: Same as Pre-Procedure . Pre-procedure diagnosis of Wound #5 is a Venous Leg Ulcer located on the Left,Lateral Lower Leg . There was a Three Layer Compression Therapy Procedure by [name], RN. Post procedure Diagnosis Wound #5: Same as Pre-Procedure Wound #7 Pre-procedure diagnosis of Wound #7 is a Venous Leg Ulcer located on the Left,Distal,Lateral Lower Leg .Severity of Tissue Pre Debridement is: Fat layer exposed. There was a Excisional Skin/Subcutaneous Tissue Debridement with a total area of 5.5 sq cm performed by Ricard Dillon., MD. With the following instrument(s): Curette to remove Viable and Non-Viable  tissue/material. Material removed includes Subcutaneous Tissue and Slough and. No specimens were taken. A time out was conducted at 09:12, prior to the start of the procedure. A Minimum amount of bleeding was controlled with Pressure. The procedure was tolerated well with a pain level of 3 throughout and a pain level of 0 following the procedure. Post Debridement Measurements: 2.2cm length x 2.5cm width x 0.2cm depth; 0.864cm^3 volume. Character of Wound/Ulcer Post Debridement is improved. Severity of Tissue Post Debridement is: Fat layer exposed. Post procedure Diagnosis Wound #7: Same as Pre-Procedure Pre-procedure diagnosis of Wound #7 is a Venous Leg Ulcer located on the Left,Distal,Lateral Lower Leg. A skin graft procedure using a bioengineered skin substitute/cellular or tissue based product was performed by Ricard Dillon., MD with the following instrument(s): N/A. Apligraf was applied and secured with Steri-Strips. 22 sq cm of product was utilized and 0 sq cm was wasted. Post Application, adaptic, gauze, compression wrap was applied. A Time Out was conducted at 09:20, prior to the start of the procedure. The procedure was tolerated well with a pain level of 0 throughout and a pain level of 0 following the procedure. Post procedure Diagnosis Wound #7: Same as Pre-Procedure . Pre-procedure diagnosis of Wound #7 is a Venous Leg Ulcer located on the Left,Distal,Lateral Lower Leg . There was a Three Layer Compression Therapy Procedure by [name], RN. Post procedure Diagnosis Wound #7: Same as Pre-Procedure Plan Follow-up Appointments: Return Appointment in 2 weeks. - MD visit Nurse Visit: - 1 week Dressing Change Frequency: Do not change entire dressing for one week. Skin Barriers/Peri-Wound Care: Barrier cream Moisturizing lotion TCA Cream or Ointment - to red/inflamed areas Wound Cleansing: May shower with protection. - cast protector Primary Wound Dressing: Wound #5  Left,Lateral Lower Leg: Skin Substitute Application - Apligraft #4 Wound #7 Left,Distal,Lateral Lower Leg: Skin Substitute Application - Apligraft #4 Secondary Dressing: Wound #5 Left,Lateral Lower Leg: Adaptic Dressing - secure with steri-strips Dry Gauze ABD pad Wound #7 Left,Distal,Lateral Lower Leg: Adaptic Dressing - secure with steri-strips Dry Gauze ABD pad Edema Control: 3 Layer Compression System - Left Lower Extremity Avoid standing for long periods of time Elevate legs to the level of the heart or above for 30 minutes daily and/or when sitting, a frequency of: - throughout the day. Exercise regularly Support Garment 20-30 mm/Hg pressure to: - patient to apply farrow wrap 4000  to right leg. Apply in the morning and remove at night. Off-Loading: Open toe surgical shoe to: - left foot 1. Apligraf #4 in the standard fashion 2. She has 2 small open areas that are improving I think this was a remanent of her fairly large original wound. There is also some smaller areas in between the raised areas of cobblestone tissue. 3. Her leg looks like chronic venous insufficiency with some skin changes of lymphedema although she does not have a lot of edema. She was seen by vein and vascular they did not think she had an arterial issue, thought she had venous issues but was not amenable to any ablation. She has no evidence of any central venous issues that I can tell. I will try to review Dr. Luther Parody notes again 4. Dermatology brought up the issue of a vasculopathy. I do not believe they biopsied and right now I have no intentions of doing this 5. I suspect this is going to be a difficult ongoing issue for this patient. She does not have a lot of edema. She is compliant with her compression stocking Electronic Signature(s) Signed: 08/04/2020 4:33:08 PM By: Linton Ham MD Entered By: Linton Ham on 08/04/2020  09:57:02 -------------------------------------------------------------------------------- SuperBill Details Patient Name: Date of Service: [name] 08/04/2020 Medical Record Number: [id number] Patient Account Number: [id number] Date of Birth/Sex: Treating RN: January 18, 1933 (86 y.o. [name] Primary Care Provider: Lajean Manes T Other Clinician: Referring Provider: Treating Provider/Extender: Evelena Peat in Treatment: 32 Diagnosis Coding ICD-10 Codes Code Description [phone] Chronic venous hypertension (idiopathic) with inflammation of right lower extremity L97.828 Non-pressure chronic ulcer of other part of left lower leg with other specified severity I87.322 Chronic venous hypertension (idiopathic) with inflammation of left lower extremity L97.522 Non-pressure chronic ulcer of other part of left foot with fat layer exposed Facility Procedures The patient participates with Medicare or their insurance follows the Medicare Facility Guidelines: CPT4 Code Description Modifier Quantity [id number] (Facility Use Only) Apligraf 1 SQ CM 44 The patient participates with Medicare or their insurance follows the Medicare Facility Guidelines: [id number] - SKIN SUB GRAFT TRNK/ARM/LEG 1 ICD-10 Diagnosis Description I87.321 Chronic venous hypertension (idiopathic) with inflammation of right  lower extremity L97.828 Non-pressure chronic ulcer of other part of left lower leg with other specified severity Physician Procedures : CPT4 Code Description Modifier [id number] - WC PHYS SKIN SUB GRAFT TRNK/ARM/LEG ICD-10 Diagnosis Description I87.321 Chronic venous hypertension (idiopathic) with inflammation of right lower extremity L97.828 Non-pressure chronic ulcer of other part  of left lower leg with other specified severity Quantity: 1 Electronic Signature(s) Signed: 08/04/2020 4:21:43 PM By: [name] RN, BSN Signed: 08/04/2020 4:33:08 PM By: Linton Ham  MD Entered By: [name] on 08/04/2020 11:29:05

## 2020-08-11 ENCOUNTER — Telehealth: Payer: Self-pay | Admitting: Internal Medicine

## 2020-08-11 ENCOUNTER — Encounter (HOSPITAL_BASED_OUTPATIENT_CLINIC_OR_DEPARTMENT_OTHER): Payer: Medicare Other | Admitting: Internal Medicine

## 2020-08-11 DIAGNOSIS — I87303 Chronic venous hypertension (idiopathic) without complications of bilateral lower extremity: Secondary | ICD-10-CM | POA: Diagnosis not present

## 2020-08-11 NOTE — Telephone Encounter (Signed)
Call placed to Pt  Appt made with GT for 08/12/2020 at 3:30 pm  Pt aware

## 2020-08-11 NOTE — Telephone Encounter (Signed)
Patient reports she has a wound on her leg and has been advised to walk to promote healing. Patient states she is unable to walk approx. 100 yards without becoming short of breath. Hx. Of  Complaint with medications including Eliquis 5 mg BID, HCTZ 25 mg daily.  Reviewed manual transmission from 08/11/20. Per last in-clinic check on 06/10/20, patient VP 30 w/ PVC.s. Appears the PVC burden has increased to an estimated 35%, previously 13%. Patient informed this could be a reason why she does not feel well. Advised her I will forward this message to Dr. Lovena Le to see about possibly getting her in to the office for evaluation.   ED precautions given to patient.  Routing to Dr. Lovena Le for recommendations and review.

## 2020-08-11 NOTE — Progress Notes (Signed)
[name], [name] ([id number]) Visit Report for 08/11/2020 SuperBill Details Patient Name: Date of Service: [place], [name] 08/11/2020 Medical Record Number: [id number] Patient Account Number: [id number] Date of Birth/Sex: Treating RN: 1933-11-29 (86 y.o. [name] Primary Care Provider: Lajean Manes T Other Clinician: Referring Provider: Treating Provider/Extender: Evelena Peat in Treatment: 33 Diagnosis Coding ICD-10 Codes Code Description I87.321 Chronic venous hypertension (idiopathic) with inflammation of right lower extremity L97.828 Non-pressure chronic ulcer of other part of left lower leg with other specified severity I87.322 Chronic venous hypertension (idiopathic) with inflammation of left lower extremity L97.522 Non-pressure chronic ulcer of other part of left foot with fat layer exposed Facility Procedures The patient participates with Medicare or their insurance follows the Medicare Facility Guidelines CPT4 Code Description Modifier Quantity [phone] (Facility Use Only) [phone] - APPLY [place] 1 Electronic Signature(s) Signed: 08/11/2020 4:22:50 PM By: Linton Ham MD Signed: 08/11/2020 4:32:31 PM By: [name] RN, BSN Entered By: [name] on 08/11/2020 08:42:54

## 2020-08-11 NOTE — Telephone Encounter (Signed)
STAT if HR is under 50 or over 120 (normal HR is 60-100 beats per minute)  1) What is your heart rate? 58-66  2) Do you have a log of your heart rate readings (document readings)? 44 47 75 73 42 52  3) Do you have any other symptoms? SOB   Pt c/o Shortness Of Breath: STAT if SOB developed within the last 24 hours or pt is noticeably SOB on the phone  1. Are you currently SOB (can you hear that pt is SOB on the phone)? No  2. How long have you been experiencing SOB? About 1 week  3. Are you SOB when sitting or when up moving around? When up and moving around  4. Are you currently experiencing any other symptoms? No

## 2020-08-12 ENCOUNTER — Other Ambulatory Visit: Payer: Self-pay

## 2020-08-12 ENCOUNTER — Ambulatory Visit: Payer: Medicare Other | Admitting: Internal Medicine

## 2020-08-12 ENCOUNTER — Encounter: Payer: Self-pay | Admitting: Internal Medicine

## 2020-08-12 VITALS — BP 108/74 | HR 80 | Ht 68.0 in | Wt 161.6 lb

## 2020-08-12 DIAGNOSIS — Z950 Presence of cardiac pacemaker: Secondary | ICD-10-CM | POA: Diagnosis not present

## 2020-08-12 DIAGNOSIS — I482 Chronic atrial fibrillation, unspecified: Secondary | ICD-10-CM

## 2020-08-12 DIAGNOSIS — I5032 Chronic diastolic (congestive) heart failure: Secondary | ICD-10-CM | POA: Diagnosis not present

## 2020-08-12 MED ORDER — PROPAFENONE HCL ER 225 MG PO CP12: 225.0000 mg | ORAL_CAPSULE | Freq: Two times a day (BID) | ORAL | 11 refills | Status: DC

## 2020-08-12 NOTE — Progress Notes (Signed)
HPI [name] returns today for evaluation of PVC's. She is a very pleasant 86 yo woman with persistent atrial fib, sinus node dysfunction, HTN, s/p PPM and AV node ablation. She has felt poorly lately with less energy and more fatigue. She was found to have 35% PVC's on her cardiac monitor/remote PM check. She was noted to have this on her PM eval. She does not have palpitations. Allergies  Allergen Reactions  . Adhesive [Tape] Rash    Including EKG electrodes and the like.  . Codeine Other (See Comments)    unknown  . Latex Itching  . Percodan [Oxycodone-Aspirin] Nausea And Vomiting    Nausea      Current Outpatient Medications  Medication Sig Dispense Refill  . antiseptic oral rinse (BIOTENE) LIQD 15 mLs by Mouth Rinse route as needed for dry mouth.    . Carboxymethylcellul-Glycerin (LUBRICATING EYE DROPS OP) Apply 1 drop to eye daily as needed (dry eyes).    . Cyanocobalamin (B-12) 1000 MCG TABS Take 1 tablet by mouth daily.    Marland Kitchen ELIQUIS 5 MG TABS tablet TAKE 1 TABLET BY MOUTH  TWICE DAILY 180 tablet 1  . hydrochlorothiazide (HYDRODIURIL) 25 MG tablet Take 1 tablet (25 mg total) by mouth daily. 90 tablet 3  . HYDROcodone-acetaminophen (NORCO) 10-325 MG tablet Take 1 tablet by mouth 3 (three) times daily as needed for pain.    Marland Kitchen levothyroxine (SYNTHROID, LEVOTHROID) 88 MCG tablet Take 88 mcg by mouth daily before breakfast.     . losartan (COZAAR) 50 MG tablet TAKE 1 TABLET BY MOUTH  DAILY 90 tablet 3  . MYRBETRIQ 50 MG TB24 tablet Take 50 mg by mouth daily.    . raNITIdine HCl (WAL-ZAN 75 PO) Take 1 tablet by mouth daily as needed.     . traMADol (ULTRAM) 50 MG tablet Take 50 mg by mouth 3 (three) times daily as needed.    . triamcinolone cream (KENALOG) 0.1 % Apply 1 application topically daily as needed.     . Vitamin D, Ergocalciferol, (DRISDOL) 50000 units CAPS capsule Take 50,000 Units by mouth every 7 (seven) days.     No current facility-administered medications  for this visit.     Past Medical History:  Diagnosis Date  . Allergic rhinitis   . Anemia   . Breast cancer, left breast ([facility]) 01/15/15   Invasive Mammary  . Chronic venous insufficiency   . Hematuria    negative workup - Dr. Reece Agar  . Hypertension   . Migraine headache   . MVP (mitral valve prolapse)   . PAF (paroxysmal atrial fibrillation) ([place])   . SVT (supraventricular tachycardia) ([facility]) 06/2010   s/p AVNRT ablation  . Wears glasses     ROS:   All systems reviewed and negative except as noted in the HPI.   Past Surgical History:  Procedure Laterality Date  . A-FLUTTER ABLATION N/A 02/04/2017   Procedure: A-Flutter Ablation;  Surgeon: Evans Lance, MD;  Location: [facility];  Service: Cardiovascular;  Laterality: N/A;  . ABDOMINAL HYSTERECTOMY    . AV NODE ABLATION N/A 08/17/2017   Procedure: AV Node Ablation;  Surgeon: Evans Lance, MD;  Location: [facility];  Service: Cardiovascular;  Laterality: N/A;  . BREAST LUMPECTOMY WITH RADIOACTIVE SEED LOCALIZATION Left 02/14/2015   Procedure: BREAST LUMPECTOMY WITH RADIOACTIVE SEED LOCALIZATION;  Surgeon: Alphonsa Overall, MD;  Location: [facility];  Service: General;  Laterality: Left;  . BREAST  SURGERY  1990   rt br bx  . CARDIAC CATHETERIZATION  2011   ablasion  . CARDIOVERSION N/A 08/10/2017   Procedure: CARDIOVERSION;  Surgeon: Jerline Pain, MD;  Location: [facility];  Service: Cardiovascular;  Laterality: N/A;  . COLONOSCOPY    . DILATION AND CURETTAGE OF UTERUS    . Left Breast Biopsy  01/15/15  . PACEMAKER IMPLANT N/A 08/17/2017   Procedure: Pacemaker Implant;  Surgeon: Evans Lance, MD;  Location: [facility];  Service: Cardiovascular;  Laterality: N/A;  . TUBAL LIGATION       Family History  Problem Relation Age of Onset  . Hypertension Mother   . Heart disease Mother   . Arthritis Mother   . Other Father        hardening of the arteries  . Heart Problems Sister         pacemaker  . Heart attack Brother   . Throat cancer Paternal Uncle   . Breast cancer Sister        early 33s  . Alzheimer's disease Sister   . Pulmonary disease Sister   . Breast cancer Daughter 72       Negative genetic testing   . Breast cancer Paternal Aunt        dx over 31  . Cancer Paternal Aunt        cancer in her leg  . Breast cancer Cousin        multiple paternal cousin     Social History   Socioeconomic History  . Marital status: Married    Spouse name: Not on file  . Number of children: 3  . Years of education: Not on file  . Highest education level: Not on file  Occupational History  . Not on file  Tobacco Use  . Smoking status: Never Smoker  . Smokeless tobacco: Never Used  . Tobacco comment: did smoke about 2cigs long time about 54 plus years ago  Vaping Use  . Vaping Use: Never used  Substance and Sexual Activity  . Alcohol use: Yes    Alcohol/week: 0.0 standard drinks    Comment: once a week  . Drug use: No  . Sexual activity: Not on file  Other Topics Concern  . Not on file  Social History Narrative   LIves with husband.    Retired.    Social Determinants of Health   Financial Resource Strain:   . Difficulty of Paying Living Expenses: Not on file  Food Insecurity:   . Worried About Charity fundraiser in the Last Year: Not on file  . Ran Out of Food in the Last Year: Not on file  Transportation Needs:   . Lack of Transportation (Medical): Not on file  . Lack of Transportation (Non-Medical): Not on file  Physical Activity:   . Days of Exercise per Week: Not on file  . Minutes of Exercise per Session: Not on file  Stress:   . Feeling of Stress : Not on file  Social Connections:   . Frequency of Communication with Friends and Family: Not on file  . Frequency of Social Gatherings with Friends and Family: Not on file  . Attends Religious Services: Not on file  . Active Member of Clubs or Organizations: Not on file  . Attends Theatre manager Meetings: Not on file  . Marital Status: Not on file  Intimate Partner Violence:   . Fear of Current or Ex-Partner: Not on  file  . Emotionally Abused: Not on file  . Physically Abused: Not on file  . Sexually Abused: Not on file     BP 108/74   Pulse 80   Ht 5\' 8"  (1.727 m)   LMP  (LMP Unknown)   SpO2 93%   BMI 24.45 kg/m   Physical Exam:  Well appearing NAD HEENT: Unremarkable Neck:  No JVD, no thyromegally Lymphatics:  No adenopathy Back:  No CVA tenderness Lungs:  Clear with no wheezes HEART:  IRegular rate rhythm, no murmurs, no rubs, no clicks Abd:  soft, positive bowel sounds, no organomegally, no rebound, no guarding Ext:  2 plus pulses, no edema, no cyanosis, no clubbing Skin:  No rashes no nodules Neuro:  CN II through XII intact, motor grossly intact  EKG - atrial fib with frequent monomorphic PVC's  DEVICE  Normal device function.  See PaceArt for details.   Assess/Plan: 1. Symptomatic PVC's - I have discussed the treatment options in detail with the patient. She is symptomatic. I have offered her initiation of AA drug therapy vs catheter ablation. She would like to start an AA drug. A prescription for propafenone 225 bid has been given and she will be carefully followed. 2. Atrial fib - her rates are well controlled.  3. HTN - her bp is well controlled.  4. Diastolic heart failure - her symptoms are class 2. No change in meds.  Carleene Overlie Nattalie Santiesteban,MD

## 2020-08-12 NOTE — Patient Instructions (Addendum)
Medication Instructions:  Your physician has recommended you make the following change in your medication:   1.  START taking propafenone 225 mg-  Take one tablet by mouth twice a day  Labwork: None ordered.  Testing/Procedures: None ordered.  Follow-Up:  NURSE VISIT-August 28, 2020 at 8:45 am at the [facility] office  Your physician wants you to follow-up in: 4-6 weeks with Dr. Lovena Le.    September 23, 2020 at 8:30 am with Dr. Lovena Le  Remote monitoring is used to monitor your Pacemaker from home. This monitoring reduces the number of office visits required to check your device to one time per year. It allows [place] to keep an eye on the functioning of your device to ensure it is working properly. You are scheduled for a device check from home on 08/26/2020. You may send your transmission at any time that day. If you have a wireless device, the transmission will be sent automatically. After your physician reviews your transmission, you will receive a postcard with your next transmission date.  Any Other Special Instructions Will Be Listed Below (If Applicable).  If you need a refill on your cardiac medications before your next appointment, please call your pharmacy.   Propafenone extended-release capsules What is this medicine? PROPAFENONE (proe pa FEEN one) is an antiarrhythmic agent. This medicine is used to prevent a type of abnormal heart rhythm called atrial fibrillation. This medicine may be used for other purposes; ask your health care provider or pharmacist if you have questions. COMMON BRAND NAME(S): Rythmol SR What should I tell my health care provider before I take this medicine? They need to know if you have any of these conditions:  heart disease  high blood levels of potassium  kidney disease  liver disease  low blood pressure  lung disease like asthma, chronic bronchitis or emphysema  pacemaker  slow heart rate  an unusual or allergic reaction to propafenone,  other medicines, foods, dyes, or preservatives  pregnant or trying to get pregnant  breast-feeding How should I use this medicine? Take this medicine by mouth with a glass of water. Follow the directions on the prescription label. Swallow whole. Do not crush or chew. You can take this medicine with or without food. Take your doses at regular intervals. Do not take your medicine more often than directed. Talk to your pediatrician regarding the use of this medicine in children. Special care may be needed. Overdosage: If you think you have taken too much of this medicine contact a poison control center or emergency room at once. NOTE: This medicine is only for you. Do not share this medicine with others. What if I miss a dose? If you miss a dose, take it as soon as you can. If it is almost time for your next dose, take only that dose. Do not take double or extra doses. What may interact with this medicine? Do not take this medicine with any of the following medications:  arsenic trioxide  certain antibiotics like clarithromycin, erythromycin, grepafloxacin, pentamidine, sparfloxacin, troleandomycin  certain medicines for depression or mental illness like amoxapine, haloperidol, maprotiline, pimozide, sertindole, thioridazine, tricyclic antidepressants  certain medicines for fungal infections like fluconazole, itraconazole, ketoconazole, posaconazole, voriconazole  certain medicines for irregular hear beat like dronedarone  certain medicines for malaria like chloroquine, halofantrine  cisapride  droperidol  levomethadyl  ranolazine This medicine may also interact with the following medications:  certain medicines for angina or blood pressure  certain medicines for asthma or breathing difficulties  like formoterol, salmeterol  certain medicines that treat or prevent blood clots like warfarin  cimetidine  cyclosporine  digoxin  diuretics  local anesthetics  other  medicines that prolong the QT interval (cause an abnormal heart rhythm) like dofetilide, ziprasidone  rifampin  ritonavir  theophylline This list may not describe all possible interactions. Give your health care provider a list of all the medicines, herbs, non-prescription drugs, or dietary supplements you use. Also tell them if you smoke, drink alcohol, or use illegal drugs. Some items may interact with your medicine. What should I watch for while using this medicine? Your condition will be monitored closely when you first begin therapy. Often, this drug is first started in a hospital or other monitored health care setting. Once you are on maintenance therapy, visit your doctor or health care professional for regular checks on your progress. Because your condition and use of this medicine carry some risk, it is a good idea to carry an identification card, necklace or bracelet with details of your condition, medications, and doctor or health care professional. Dennis Bast may get drowsy or dizzy. Do not drive, use machinery, or do anything that needs mental alertness until you know how this medicine affects you. Do not stand or sit up quickly, especially if you are an older patient. This reduces the risk of dizzy or fainting spells. If you are going to have surgery, tell your doctor or health care professional that you are taking this medicine. What side effects may I notice from receiving this medicine? Side effects that you should report to your doctor or health care professional as soon as possible:  chest pain, palpitations  fever or chills  shortness of breath  swelling of feet or legs  trembling or shaking Side effects that usually do not require medical attention (report to your doctor or health care professional if they continue or are bothersome):  blurred vision  changes in taste (a metallic or bitter taste)  constipation or diarrhea  dry mouth  headache  nausea or  vomiting  tiredness or weakness This list may not describe all possible side effects. Call your doctor for medical advice about side effects. You may report side effects to FDA at 1-800-FDA-1088. Where should I keep my medicine? Keep out of the reach of children. Store at room temperature between 15 and 30 degrees C (59 and 86 degrees F). Keep container tightly closed. Throw away any unused medicine after the expiration date. NOTE: This sheet is a summary. It may not cover all possible information. If you have questions about this medicine, talk to your doctor, pharmacist, or health care provider.  2020 Elsevier/Gold Standard (2018-11-21 08:49:36)

## 2020-08-13 NOTE — Progress Notes (Signed)
[name], [name] ([id number]) Visit Report for 08/11/2020 Arrival Information Details Patient Name: Date of Service: [name], [name] 08/11/2020 8:30 A M Medical Record Number: [id number] Patient Account Number: [id number] Date of Birth/Sex: Treating RN: [birth year] (86 y.o. [name] Primary Care [name]: Patrina Levering Other Clinician: Referring [name]: Treating [name]/Extender: Evelena Peat in Treatment: 46 Visit Information History Since Last Visit Added or deleted any medications: No Patient Arrived: Ambulatory Any new allergies or adverse reactions: No Arrival Time: 08:28 Had a fall or experienced change in No Accompanied By: husband activities of daily living that may affect Transfer Assistance: None risk of falls: Patient Identification Verified: Yes Signs or symptoms of abuse/neglect since last visito No Secondary Verification Process Completed: Yes Hospitalized since last visit: No Patient Requires Transmission-Based Precautions: No Has Dressing in Place as Prescribed: Yes Patient Has Alerts: Yes Pain Present Now: Yes Patient Alerts: Patient on Blood Thinner Right ABI:0.99 Electronic Signature(s) Signed: 08/13/2020 11:00:36 AM By: Sandre Kitty Entered By: Sandre Kitty on 08/11/2020 08:28:58 -------------------------------------------------------------------------------- Compression Therapy Details Patient Name: Date of Service: [name]. 08/11/2020 8:30 A M Medical Record Number: [id number] Patient Account Number: [id number] Date of Birth/Sex: Treating RN: 11-01-1933 (86 y.o. [name] Primary Care [name]: Lajean Manes T Other Clinician: Referring [name]: Treating [name]/Extender: Evelena Peat in Treatment: 33 Compression Therapy Performed for Wound Assessment: Wound #5 Left,Lateral Lower Leg Performed By: Clinician [name], RN Compression Type: Three  Hydrologist) Signed: 08/11/2020 4:32:31 PM By: [name] RN, BSN Entered By: [name] on 08/11/2020 08:34:42 -------------------------------------------------------------------------------- Encounter Discharge Information Details Patient Name: Date of Service: [name]. 08/11/2020 8:30 A M Medical Record Number: [id number] Patient Account Number: [id number] Date of Birth/Sex: Treating RN: 18-Sep-1933 (86 y.o. [name] Primary Care Alixandrea Milleson: Lajean Manes T Other Clinician: Referring [name]: Treating [name]/Extender: Evelena Peat in Treatment: 45 Encounter Discharge Information Items Discharge Condition: Stable Ambulatory Status: Ambulatory Discharge Destination: Home Transportation: Private Auto Accompanied By: spouse Schedule Follow-up Appointment: Yes Clinical Summary of Care: Patient Declined Electronic Signature(s) Signed: 08/11/2020 4:32:31 PM By: [name] RN, BSN Entered By: [name] on 08/11/2020 08:38:09 -------------------------------------------------------------------------------- Patient/Caregiver Education Details Patient Name: Date of Service: [name] 8/30/2021andnbsp8:30 A M Medical Record Number: [id number] Patient Account Number: [id number] Date of Birth/Gender: Treating RN: 30-Oct-1933 (86 y.o. [name] Primary Care Physician: Lajean Manes T Other Clinician: Referring Physician: Treating Physician/Extender: Evelena Peat in Treatment: 15 Education Assessment Education Provided To: Patient Education Topics Provided Venous: Methods: Explain/Verbal Responses: Reinforcements needed, State content correctly [employer]) Signed: 08/11/2020 4:32:31 PM By: [name] RN, BSN Entered By: [name] on 08/11/2020 08:37:41 -------------------------------------------------------------------------------- Wound  Assessment Details Patient Name: Date of Service: [name]. 08/11/2020 8:30 A M Medical Record Number: [id number] Patient Account Number: [id number] Date of Birth/Sex: Treating RN: [birth year] (86 y.o. [name] Primary Care Avelino Herren: Lajean Manes T Other Clinician: Referring [name]: Treating Osha Errico/Extender: [name] in Treatment: 33 Wound Status Wound Number: 5 Primary Venous Leg Ulcer Etiology: Wound Location: Left, Lateral Lower Leg Wound Open Wounding Event: Gradually Appeared Status: Date Acquired: 03/21/2020 Comorbid Cataracts, Anemia, Arrhythmia, Congestive Heart Failure, Weeks Of Treatment: 20 History: Hypertension, Peripheral Venous Disease, Received Radiation Clustered Wound: No Wound Measurements Length: (cm) 4.5 Width: (cm) 3.5 Depth: (cm) 0.1 Area: (cm) 12.37 Volume: (cm) 1.237 % Reduction in Area: -  2359.2% % Reduction in Volume: -2374% Epithelialization: Small (1-33%) Wound Description Classification: Full Thickness Without Exposed Support Structures Wound Margin: Flat and Intact Exudate Amount: Medium Exudate Type: Serous Exudate Color: amber Foul Odor After Cleansing: No Slough/Fibrino Yes Wound Bed Granulation Amount: Medium (34-66%) Exposed Structure Granulation Quality: Pink Fascia Exposed: No Necrotic Amount: Medium (34-66%) Fat Layer (Subcutaneous Tissue) Exposed: Yes Necrotic Quality: Adherent Slough Tendon Exposed: No Muscle Exposed: No Joint Exposed: No Bone Exposed: No Assessment Notes unable to visualize wound bed, adaptic and apligraf intact Treatment Notes Wound #5 (Left, Lateral Lower Leg) 2. Periwound Care Moisturizing lotion TCA Cream 3. Primary Dressing Applied Hydrogel or K-Y Jelly 4. Secondary Dressing ABD Pad Dry Gauze 6. Support Layer Applied 3 layer compression Water quality scientist) Signed: 08/11/2020 4:24:12 PM By: [name] RN, BSN Signed: 08/11/2020  4:32:31 PM By: [name] RN, BSN Entered By: [name] on 08/11/2020 08:35:35 -------------------------------------------------------------------------------- Wound Assessment Details Patient Name: Date of Service: [name]. 08/11/2020 8:30 A M Medical Record Number: [id number] Patient Account Number: [id number] Date of Birth/Sex: Treating RN: August 05, 1933 (86 y.o. [name] Primary Care Delanee Xin: Lajean Manes T Other Clinician: Referring Aluna Whiston: Treating Toyia Jelinek/Extender: [name] in Treatment: 33 Wound Status Wound Number: 7 Primary Venous Leg Ulcer Etiology: Wound Location: Left, Distal, Lateral Lower Leg Wound Open Wounding Event: Gradually Appeared Status: Date Acquired: 05/30/2020 Comorbid Cataracts, Anemia, Arrhythmia, Congestive Heart Failure, Weeks Of Treatment: 10 History: Hypertension, Peripheral Venous Disease, Received Radiation Clustered Wound: Yes Wound Measurements Length: (cm) 2.2 Width: (cm) 2.5 Depth: (cm) 0.2 Clustered Quantity: 2 Area: (cm) 4.32 Volume: (cm) 0.864 % Reduction in Area: -2651.6% % Reduction in Volume: -5300% Epithelialization: Small (1-33%) Tunneling: No Undermining: No Wound Description Classification: Full Thickness Without Exposed Support Structures Wound Margin: Flat and Intact Exudate Amount: Medium Exudate Type: Serous Exudate Color: amber Foul Odor After Cleansing: No Slough/Fibrino Yes Wound Bed Granulation Amount: Small (1-33%) Exposed Structure Granulation Quality: Pink Fascia Exposed: No Necrotic Amount: Large (67-100%) Fat Layer (Subcutaneous Tissue) Exposed: Yes Necrotic Quality: Adherent Slough Tendon Exposed: No Muscle Exposed: No Joint Exposed: No Bone Exposed: No Assessment Notes unable to visualize wound bed, adaptic and apligraf intact Treatment Notes Wound #7 (Left, Distal, Lateral Lower Leg) 2. Periwound Care Moisturizing lotion TCA  Cream 3. Primary Dressing Applied Hydrogel or K-Y Jelly 4. Secondary Dressing ABD Pad Dry Gauze 6. Support Layer Applied 3 layer compression Water quality scientist) Signed: 08/11/2020 4:24:12 PM By: [name] RN, BSN Signed: 08/11/2020 4:32:31 PM By: [name] RN, BSN Entered By: [name] on 08/11/2020 08:36:17 -------------------------------------------------------------------------------- [place] Details Patient Name: Date of Service: [name]. 08/11/2020 8:30 A M Medical Record Number: [id number] Patient Account Number: [id number] Date of Birth/Sex: Treating RN: 1933/02/15 (86 y.o. [name] Primary Care Fendi Meinhardt: Lajean Manes T Other Clinician: Referring Abra Lingenfelter: Treating Corben Auzenne/Extender: Evelena Peat in Treatment: 33 Vital Signs Time Taken: 08:28 Temperature (F): 98.1 Height (in): 69 Pulse (bpm): 66 Weight (lbs): 163 Respiratory Rate (breaths/min): 18 Body Mass Index (BMI): 24.1 Blood Pressure (mmHg): 166/82 Reference Range: 80 - 120 mg / dl Electronic Signature(s) Signed: 08/13/2020 11:00:36 AM By: Sandre Kitty Signed: 08/13/2020 11:00:36 AM By: Sandre Kitty Entered By: Sandre Kitty on 08/11/2020 08:29:13

## 2020-08-19 ENCOUNTER — Encounter (HOSPITAL_BASED_OUTPATIENT_CLINIC_OR_DEPARTMENT_OTHER): Payer: Medicare Other | Attending: Internal Medicine | Admitting: Internal Medicine

## 2020-08-19 DIAGNOSIS — I509 Heart failure, unspecified: Secondary | ICD-10-CM | POA: Diagnosis not present

## 2020-08-19 DIAGNOSIS — L97828 Non-pressure chronic ulcer of other part of left lower leg with other specified severity: Secondary | ICD-10-CM | POA: Diagnosis present

## 2020-08-19 DIAGNOSIS — I87322 Chronic venous hypertension (idiopathic) with inflammation of left lower extremity: Secondary | ICD-10-CM | POA: Insufficient documentation

## 2020-08-19 DIAGNOSIS — I87321 Chronic venous hypertension (idiopathic) with inflammation of right lower extremity: Secondary | ICD-10-CM | POA: Diagnosis not present

## 2020-08-19 DIAGNOSIS — L97522 Non-pressure chronic ulcer of other part of left foot with fat layer exposed: Secondary | ICD-10-CM | POA: Diagnosis not present

## 2020-08-19 DIAGNOSIS — I110 Hypertensive heart disease with heart failure: Secondary | ICD-10-CM | POA: Insufficient documentation

## 2020-08-19 DIAGNOSIS — Z7901 Long term (current) use of anticoagulants: Secondary | ICD-10-CM | POA: Insufficient documentation

## 2020-08-20 ENCOUNTER — Encounter (HOSPITAL_BASED_OUTPATIENT_CLINIC_OR_DEPARTMENT_OTHER): Payer: Medicare Other | Admitting: Physician Assistant

## 2020-08-20 ENCOUNTER — Other Ambulatory Visit: Payer: Self-pay

## 2020-08-20 DIAGNOSIS — I87321 Chronic venous hypertension (idiopathic) with inflammation of right lower extremity: Secondary | ICD-10-CM | POA: Diagnosis not present

## 2020-08-20 NOTE — Progress Notes (Signed)
[name], [name] ([id number]) Visit Report for 08/19/2020 Arrival Information Details Patient Name: Date of Service: [place], [name] 08/19/2020 8:30 A M Medical Record Number: [id number] Patient Account Number: [id number] Date of Birth/Sex: Treating RN: 1933-11-08 (86 y.o. [name] Primary Care Zailey Audia: Lajean Manes T Other Clinician: Referring Airiana Elman: Treating Drayce Tawil/Extender: Evelena Peat in Treatment: 59 Visit Information History Since Last Visit Added or deleted any medications: No Patient Arrived: Ambulatory Any new allergies or adverse reactions: No Arrival Time: 08:37 Had a fall or experienced change in No Accompanied By: husband activities of daily living that may affect Transfer Assistance: None risk of falls: Patient Identification Verified: Yes Signs or symptoms of abuse/neglect since last visito No Secondary Verification Process Completed: Yes Hospitalized since last visit: No Patient Requires Transmission-Based Precautions: No Implantable device outside of the clinic excluding No Patient Has Alerts: Yes cellular tissue based products placed in the center Patient Alerts: Patient on Blood Thinner since last visit: Right ABI:0.99 Has Dressing in Place as Prescribed: Yes Has Compression in Place as Prescribed: Yes Pain Present Now: Yes Electronic Signature(s) Signed: 08/19/2020 5:24:19 PM By: Kela Millin Entered By: Kela Millin on 08/19/2020 08:38:01 -------------------------------------------------------------------------------- Compression Therapy Details Patient Name: Date of Service: [name] J. 08/19/2020 8:30 A M Medical Record Number: [id number] Patient Account Number: [id number] Date of Birth/Sex: Treating RN: 1933/11/08 (86 y.o. [name] Primary Care Gaylyn Berish: Lajean Manes T Other Clinician: Referring Kerah Hardebeck: Treating Harve Spradley/Extender: Evelena Peat in Treatment:  34 Compression Therapy Performed for Wound Assessment: Wound #5 Left,Lateral Lower Leg Performed By: Clinician Carlene Coria, RN Compression Type: Three Layer Post Procedure Diagnosis Same as Pre-procedure Electronic Signature(s) Signed: 08/20/2020 5:14:09 PM By: Carlene Coria RN Entered By: Carlene Coria on 08/19/2020 09:20:06 -------------------------------------------------------------------------------- Encounter Discharge Information Details Patient Name: Date of Service: [name], [place]. 08/19/2020 8:30 A M Medical Record Number: [id number] Patient Account Number: [id number] Date of Birth/Sex: Treating RN: 02-06-1933 (85 y.o. [name] Primary Care Kenshin Splawn: Patrina Levering Other Clinician: Referring Tylee Yum: Treating Yasmen Cortner/Extender: Evelena Peat in Treatment: 28 Encounter Discharge Information Items Discharge Condition: Stable Ambulatory Status: Ambulatory Discharge Destination: Home Transportation: Private Auto Accompanied By: husband Schedule Follow-up Appointment: Yes Clinical Summary of Care: Patient Declined Electronic Signature(s) Signed: 08/19/2020 5:24:19 PM By: Kela Millin Entered By: Kela Millin on 08/19/2020 09:23:56 -------------------------------------------------------------------------------- Lower Extremity Assessment Details Patient Name: Date of Service: Tracy Sermon. 08/19/2020 8:30 A M Medical Record Number: [id number] Patient Account Number: [id number] Date of Birth/Sex: Treating RN: Jun 06, 1933 (86 y.o. [name] Primary Care Marly Schuld: Patrina Levering Other Clinician: Referring Shantil Vallejo: Treating Madysun Thall/Extender: [name] in Treatment: 34 Edema Assessment Assessed: [Left: No] [Right: No] Edema: [Left: N] [Right: o] Calf Left: Right: Point of Measurement: 41 cm From Medial Instep 34 cm cm Ankle Left: Right: Point of Measurement: 14 cm From Medial  Instep 23 cm cm Vascular Assessment Pulses: Dorsalis Pedis Palpable: [Left:Yes] Electronic Signature(s) Signed: 08/19/2020 5:24:19 PM By: Kela Millin Entered By: Kela Millin on 08/19/2020 08:46:39 -------------------------------------------------------------------------------- Multi Wound Chart Details Patient Name: Date of Service: [name] J. 08/19/2020 8:30 A M Medical Record Number: [id number] Patient Account Number: [id number] Date of Birth/Sex: Treating RN: 11/04/1933 (86 y.o. [name] Primary Care Makayli Bracken: Lajean Manes T Other Clinician: Referring Zaineb Nowaczyk: Treating Quiana Cobaugh/Extender: [name] in Treatment: 34 Vital Signs Height(in): 69 Pulse(bpm): 62 Weight(lbs): 163 Blood  Pressure(mmHg): 164/93 Body Mass Index(BMI): 24 Temperature(F): 98 Respiratory Rate(breaths/min): 19 Photos: [5:No Photos Left, Lateral Lower Leg] [7:No Photos Left, Distal, Lateral Lower Leg] [N/A:N/A N/A] Wound Location: [5:Gradually Appeared] [7:Gradually Appeared] [N/A:N/A] Wounding Event: [5:Venous Leg Ulcer] [7:Venous Leg Ulcer] [N/A:N/A] Primary Etiology: [5:Cataracts, Anemia, Arrhythmia,] [7:Cataracts, Anemia, Arrhythmia,] [N/A:N/A] Comorbid History: [5:Congestive Heart Failure, Hypertension, Peripheral Venous Disease, Received Radiation 03/21/2020] [7:Congestive Heart Failure, Hypertension, Peripheral Venous Disease, Received Radiation 05/30/2020] [N/A:N/A] Date Acquired: [5:21] [7:11] [N/A:N/A] Weeks of Treatment: [5:Open] [7:Converted] [N/A:N/A] Wound Status: [5:No] [7:Yes] [N/A:N/A] Clustered Wound: [5:11x6.5x0.2] [7:2.2x2.5x0.2] [N/A:N/A] Measurements L x W x D (cm) [5:56.156] [7:4.32] [N/A:N/A] A (cm) : rea [5:11.231] [7:0.864] [N/A:N/A] Volume (cm) : [5:-11064.20%] [7:-2651.60%] [N/A:N/A] % Reduction in Area: [5:-22362.00%] [7:-5300.00%] [N/A:N/A] % Reduction in Volume: [5:Full Thickness Without Exposed] [7:Full Thickness  Without Exposed] [N/A:N/A] Classification: [5:Support Structures Medium] [7:Support Structures N/A] [N/A:N/A] Exudate Amount: [5:Serous] [7:N/A] [N/A:N/A] Exudate Type: [5:amber] [7:N/A] [N/A:N/A] Exudate Color: [5:Flat and Intact] [7:Distinct, outline attached] [N/A:N/A] Wound Margin: [5:Medium (34-66%)] [7:Medium (34-66%)] [N/A:N/A] Granulation Amount: [5:Pink] [7:Pink] [N/A:N/A] Granulation Quality: [5:Medium (34-66%)] [7:Medium (34-66%)] [N/A:N/A] Necrotic Amount: [5:Fat Layer (Subcutaneous Tissue): Yes Fat Layer (Subcutaneous Tissue): Yes N/A] Exposed Structures: [5:Fascia: No Tendon: No Muscle: No Joint: No Bone: No None] [7:Fascia: No Tendon: No Muscle: No Joint: No Bone: No None] [N/A:N/A] Treatment Notes Electronic Signature(s) Signed: 08/19/2020 5:12:55 PM By: Linton Ham MD Signed: 08/20/2020 5:14:09 PM By: Carlene Coria RN Entered By: Linton Ham on 08/19/2020 09:08:55 -------------------------------------------------------------------------------- Multi-Disciplinary Care Plan Details Patient Name: Date of Service: [name], [place]. 08/19/2020 8:30 A M Medical Record Number: [id number] Patient Account Number: [id number] Date of Birth/Sex: Treating RN: [birth year] (86 y.o. [name] Primary Care Aragon Scarantino: Other Clinician: Patrina Levering Referring Lowen Mansouri: Treating Burrel Legrand/Extender: Evelena Peat in Treatment: 58 Active Inactive Wound/Skin Impairment Nursing Diagnoses: Knowledge deficit related to ulceration/compromised skin integrity Goals: Patient/caregiver will verbalize understanding of skin care regimen Date Initiated: 12/20/2019 Target Resolution Date: 08/22/2020 Goal Status: Active Interventions: Assess patient/caregiver ability to perform ulcer/skin care regimen upon admission and as needed Provide education on ulcer and skin care Treatment Activities: Skin care regimen initiated : 12/20/2019 Topical wound management  initiated : 12/20/2019 Notes: Electronic Signature(s) Signed: 08/20/2020 5:14:09 PM By: Carlene Coria RN Entered By: Carlene Coria on 08/19/2020 08:29:06 -------------------------------------------------------------------------------- Pain Assessment Details Patient Name: Date of Service: [name] J. 08/19/2020 8:30 A M Medical Record Number: [id number] Patient Account Number: [id number] Date of Birth/Sex: Treating RN: [birth year] (86 y.o. [name] Primary Care Irlene Crudup: Patrina Levering Other Clinician: Referring Wonda Goodgame: Treating Amia Rynders/Extender: Evelena Peat in Treatment: 33 Active Problems Location of Pain Severity and Description of Pain Patient Has Paino Yes Site Locations Pain Location: Pain in Ulcers With Dressing Change: Yes Duration of the Pain. Constant / Intermittento Constant Rate the pain. Current Pain Level: 10 Worst Pain Level: 10 Least Pain Level: 7 Tolerable Pain Level: 6 Character of Pain Describe the Pain: Aching, Burning, Stabbing Pain Management and Medication Current Pain Management: Electronic Signature(s) Signed: 08/19/2020 5:24:19 PM By: Kela Millin Entered By: Kela Millin on 08/19/2020 08:38:56 -------------------------------------------------------------------------------- Patient/Caregiver Education Details Patient Name: Date of Service: [place], [name] 9/7/2021andnbsp8:30 A M Medical Record Number: [id number] Patient Account Number: [id number] Date of Birth/Gender: Treating RN: [birth year] (86 y.o. [name] Primary Care Physician: Lajean Manes T Other Clinician: Referring Physician: Treating Physician/Extender: Evelena Peat in Treatment: 61 Education Assessment Education Provided To: Patient Education Topics  Provided Wound/Skin Impairment: Methods: Explain/Verbal Responses: State content correctly Electronic Signature(s) Signed: 08/20/2020 5:14:09  PM By: Carlene Coria RN Entered By: Carlene Coria on 08/19/2020 08:29:34 -------------------------------------------------------------------------------- Wound Assessment Details Patient Name: Date of Service: Tracy Sermon. 08/19/2020 8:30 A M Medical Record Number: [id number] Patient Account Number: [id number] Date of Birth/Sex: Treating RN: [birth year] (86 y.o. [name] Primary Care Tashari Schoenfelder: Lajean Manes T Other Clinician: Referring Dalene Robards: Treating Chavie Kolinski/Extender: [name] in Treatment: 34 Wound Status Wound Number: 5 Primary Venous Leg Ulcer Etiology: Wound Location: Left, Lateral Lower Leg Wound Open Wounding Event: Gradually Appeared Status: Date Acquired: 03/21/2020 Comorbid Cataracts, Anemia, Arrhythmia, Congestive Heart Failure, Weeks Of Treatment: 21 History: Hypertension, Peripheral Venous Disease, Received Radiation Clustered Wound: No Photos Photo Uploaded By: Mikeal Hawthorne on 08/20/2020 13:48:41 Wound Measurements Length: (cm) 11 Width: (cm) 6.5 Depth: (cm) 0.2 Area: (cm) 56.156 Volume: (cm) 11.231 % Reduction in Area: -11064.2% % Reduction in Volume: -22362% Epithelialization: None Tunneling: No Undermining: No Wound Description Classification: Full Thickness Without Exposed Support Structu Wound Margin: Flat and Intact Exudate Amount: Medium Exudate Type: Serous Exudate Color: amber res Foul Odor After Cleansing: No Slough/Fibrino Yes Wound Bed Granulation Amount: Medium (34-66%) Exposed Structure Granulation Quality: Pink Fascia Exposed: No Necrotic Amount: Medium (34-66%) Fat Layer (Subcutaneous Tissue) Exposed: Yes Necrotic Quality: Adherent Slough Tendon Exposed: No Muscle Exposed: No Joint Exposed: No Bone Exposed: No Treatment Notes Wound #5 (Left, Lateral Lower Leg) 1. Cleanse With Wound Cleanser Soap and water 2. Periwound Care TCA Cream 3. Primary Dressing Applied Calcium  Alginate Ag 4. Secondary Dressing ABD Pad Dry Gauze 6. Support Layer Applied 3 layer compression wrap Electronic Signature(s) Signed: 08/19/2020 5:24:19 PM By: Kela Millin Entered By: Kela Millin on 08/19/2020 08:50:19 -------------------------------------------------------------------------------- Wound Assessment Details Patient Name: Date of Service: Tracy Sermon. 08/19/2020 8:30 A M Medical Record Number: [id number] Patient Account Number: [id number] Date of Birth/Sex: Treating RN: 1933/01/06 (86 y.o. [name] Primary Care Cadince Hilscher: Lajean Manes T Other Clinician: Referring Erendira Crabtree: Treating Rashay Barnette/Extender: [name] in Treatment: 34 Wound Status Wound Number: 7 Primary Venous Leg Ulcer Etiology: Wound Location: Left, Distal, Lateral Lower Leg Wound Converted Wounding Event: Gradually Appeared Status: Date Acquired: 05/30/2020 Comorbid Cataracts, Anemia, Arrhythmia, Congestive Heart Failure, Weeks Of Treatment: 11 History: Hypertension, Peripheral Venous Disease, Received Radiation Clustered Wound: Yes Wound Measurements Length: (cm) 2.2 Width: (cm) 2.5 Depth: (cm) 0.2 Area: (cm) 4.32 Volume: (cm) 0.864 % Reduction in Area: -2651.6% % Reduction in Volume: -5300% Epithelialization: None Tunneling: No Undermining: No Wound Description Classification: Full Thickness Without Exposed Support Structur Wound Margin: Distinct, outline attached es Wound Bed Granulation Amount: Medium (34-66%) Exposed Structure Granulation Quality: Pink Fascia Exposed: No Necrotic Amount: Medium (34-66%) Fat Layer (Subcutaneous Tissue) Exposed: Yes Necrotic Quality: Adherent Slough Tendon Exposed: No Muscle Exposed: No Joint Exposed: No Bone Exposed: No Electronic Signature(s) Signed: 08/19/2020 5:24:19 PM By: Kela Millin Entered By: Kela Millin on 08/19/2020  08:47:13 -------------------------------------------------------------------------------- [place] Details Patient Name: Date of Service: [name] J. 08/19/2020 8:30 A M Medical Record Number: [id number] Patient Account Number: [id number] Date of Birth/Sex: Treating RN: Dec 16, 1932 (86 y.o. [name] Primary Care Kylor Valverde: Lajean Manes T Other Clinician: Referring Meyah Corle: Treating Shayle Donahoo/Extender: Evelena Peat in Treatment: 34 Vital Signs Time Taken: 08:38 Temperature (F): 98 Height (in): 69 Pulse (bpm): 62 Weight (lbs): 163 Respiratory Rate (breaths/min): 19 Body Mass Index (BMI): 24.1 Blood Pressure (mmHg): 164/93  Reference Range: 80 - 120 mg / dl Electronic Signature(s) Signed: 08/19/2020 5:24:19 PM By: Kela Millin Entered By: Kela Millin on 08/19/2020 08:38:33

## 2020-08-20 NOTE — Progress Notes (Signed)
[name], [name] ([id number]) Visit Report for 08/19/2020 HPI Details Patient Name: Date of Service: [name], [name] 08/19/2020 8:30 A M Medical Record Number: [id number] Patient Account Number: [id number] Date of Birth/Sex: Treating RN: Feb 06, 1933 (86 y.o. [name] Primary Care Provider: Lajean Green T Other Clinician: Referring Provider: Treating Provider/Extender: [name] in Treatment: 43 History of Present Illness HPI Description: ADMISSION 12/20/2019 This is an 86 year old woman referred by her primary physician Dr. Felipa Green for review of a wound on her right lateral lower leg. She was actually in this clinic on 2 separate occasions in 2010 and 2012 cared for by [name]. At that point in time she had wounds on her right leg as well. She tells [place] to 1 month ago she noticed a scab building up on her right lateral lower leg this opened into a wound. There was no overt cause of this no trauma, no infection that she is aware of. She has a history of chronic venous insufficiency and wears compression stockings fairly religiously indeed she is done well over the last 8 years since she was last in this clinic. She has been applying Vaseline on this and a covering phone. This is not progressing towards healing. Past medical history; interstitial lung disease, chronic atrial fibrillation status post pacemaker, osteoarthritis of the left knee, left breast CA, chronic repeat venous insufficiency. She takes Eliquis for her atrial fibrillation stroke prophylaxis. ABI in our clinic was 0.99 on the right 1/15; superficial wound on the right lateral calf in the setting of severe acute skin changes from chronic venous insufficiency and lymphedema. We used Iodoflex last week she complained of a lot of pain 1/22; this is a small but difficult wound on the right lateral calf in the setting of severe chronic venous insufficiency and secondary lymphedema. She continues  to state the wounds things hurts when she is up on it but seems to be relieved by putting her leg up. I changed her to [facility] last week because the Iodoflex seem to be causing stinging. 1/29. This wound appears to be contracting somewhat. Changes of chronic venous insufficiency with secondary lymphedema 2/12; not as good as surface today and slightly bigger. I had to change her from Iodoflex to [facility] because of the stinging pain although she does not think it was any better on the [facility]. She comes into clinic today with a area on the medial left great toe. She said she noticed blood on her sock last Saturday. She had some form of bunion surgery by Dr. Ila Green her podiatrist sometime in 2019 she said he "shave the area". I could not find his operative report although I did see reference to the bunion in the area. 2/19; somewhat improved wound on the right lateral lower leg however the wound she came in on the bunion of her left great toe is actually I think larger still somewhat inflamed. We have been using Iodoflex 2/26; not much improvement in either wound area which is the original venous wound on the right lateral lower leg and in the area on the bunion of her left great toe. This still looks somewhat inflamed and tender. We have been using Iodoflex with open much improvement. 3/4; in general both her wounds look better this includes the original venous insufficiency wound on the right lateral lower leg and the area on her tip of her bunion on the left great toe medial aspect of the MTP. Change  to [facility] last week 3/12; the patient still has a small geographic shaped wound on the right lateral lower leg. We have been using Hydrofera Blue but I changed her to endoform today. She also has the area in the tip of the bunion of the left great toe. We will try Hydrofera Blue here as well 3/19; the small geographic wound on the right lateral lower leg has not filled  in. There is some surrounding induration we have noticed this previously. This may be venous inflammation but I wonder about biopsying this if this is not closing up. The area over the left medial first MTP [bunion deformity] requires debridement. This is not closing in. I am not convinced she is offloading adequately 3/26; right lateral lower leg in the setting of severe venous insufficiency and also an area over the first MTP bunion deformity. No debridement is required. We have been using endoform 4/9; right lateral lower leg wound in the setting of severe venous insufficiency and also a refractory area over the first MTP bunion deformity. The area on the right lateral lower leg is just about closed there is still a minor open area here. She comes in today with a mirror image area on the left lateral lower leg. She says this started as a scab 2 weeks ago and is gradually morphed into an open wound very similar to what she has on the right leg. She has severe bilateral venous insufficiency with venous hypertension obvious from clinical exam. 4/16; her original wound the right lateral leg wound in the setting of chronic venous hypertension is a small open area but very small. We have been using endoform. Her wound over the left first MTP bunion deformity also appears to be small and closing in. Unfortunately she has a large relative area on the left lateral calf which was new last week. Very adherent debris over this we have been using Iodoflex however that may be contributing to the debris on the wound surface 4/23; the original wound is closed and the area on the left first metatarsal bunion deformity is also almost closed the new area from last week required debridement. She went for her reflux studies that did not take her lower extremity wraps off. This is not helpful. She did have significant reflux in the right common femoral vein. I am not going to press this issue further. She clinically  has severe venous hypertension 4/29; the original wound is closed on the right lateral lower calf. The area on the left first metatarsal/bunion deformity has a small slitlike opening that is still not closed. The real problem here is now wound on the left lateral calf which is necrotic and deep. We used Iodoflex on this wound last time. Silver alginate on the left first toe The patient has Farrow wraps. We should be able to transition the right leg into compression stockings and were doing this today. 5/7; the original wound remains closed on the right lateral calf. The left first metatarsal head still is open. Deterioration in the wound which is the new wound from several weeks ago on the left lateral calf. The patient is not aware how this could have happened. We have been using Sorbact starting last week under 3 layer compression 5/14; the right lateral calf is closed the area on the first met head on the left is closed However the new area from 3 weeks ago on the left lateral calf continues to expand. There is raised nodular tissue around  the wound necrotic debris on the surface. Circumference of the wound also looks necrotic. It looks as though there is involvement of the tissue under the skin around the large area of this wound which looks threatened. She is complaining of pain. Culture of this wound I did last week showed rare coag negative staph variant I have never heard of although I am doubtful this has clinical significance 05/09/20-Patient returns with a left lateral calf wound looking about the same, the biopsies reviewed show no atypical features and consistent with trauma or pressure injury or stasis change, patient has appointment to be scheduled with vascular We are using silver alginate and 3 layer compression Patient was started on Trental by dermatology which she has been taking for a week now 6/4; left lateral calf wound. I reviewed the dermatology notes with Dr. Roderic Scarce from  [facility] dermatology. 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 Put her on pentoxifylline noteworthy that she is on Eliquis as well. We have been using silver alginate under compression. She has an appointment for repeat vascular studies on 7/6. This gets back to the fact that they did not take the wrap off on the original studies that were done. I do not believe she has an arterial issue. 6/10; left lateral calf wound. I put her on Hydrofera Blue last week. Some improvement in the surface condition of the wound. She has repeat vascular studies/reflux studies on 7/6. We are trying to get Apligraf through [place] [employer]. Her husband states he looked on the website this is not going to be approved. I am really not sure if this is a "class effect" 6/18; somewhat surprisingly Apligraf was approved and 100% covered. Her husband was also quite shocked by this. Nevertheless we have her repeat vascular studies on 7/6 and I will not be able to apply this until this is done. We are using Hydrofera Blue to the wound on the left. Her original wound on the right lower leg has maintain closure using compression stocking 6/25; repeat vascular studies/venous on 7/6; we will apply the Apligraf I think that week. We are also going to work around a week's vacation they have the first week in August. 7/2; reflux studies on 7/6;. Likely place first Apligraf from the major wound area on 7/9; 7/9; patient was kindly seen by Dr. Arbie Cookey of vein and vascular to review her circulation status with the refractory wounds in the left leg. At first wounds in this clinic were on the right leg and they heal she now has a juxta lite stocking. Dr. Arbie Cookey thought she had venous stasis disease with venous ulcerations. He did not feel that there was anything that would suggest to expect approval with a blush ablation of her saphenous vein  the saphenous veins were felt to be relatively small in caliber. She was not felt to have an arterial issue Apligraf #1 apply today in the standard fashion 7/23; the patient came in today complaining of a lot of pain in the left lower leg where the wound is. We applied Apligraf 2 weeks ago I also increased her compression from 3-4 layer wondering about the venous hypertension. She arrives today with her original wound that we have been treating with Apligraf looking quite healthy although there is some surface slough. Problematically she has de-epithelialized and developed erythema below the wound towards the lateral malleolus itself. This is somewhat angry. I think this is venous  stasis loss of epithelialization. I do not believe this is infection . Apligraf #2 applied to the original wound area 8/9-Patient returns at 2 weeks for Apligraf #3, the left leg venous leg ulcer area had more slough this time 8/23 Apligraf #4 her wound areas are small however she is still complaining of pain. She has previously been seen by vein and vascular. She has not felt to have an arterial issue she was felt to have venous stasis disease. She originally came here with a wound on her right leg on the right lateral calf this healed out. She then developed a difficult area on the left. She saw dermatology wondered whether this could be a vasculopathy. I am not sure if she is still on pentoxifylline the dermatology suggested I will have to clarify this with 9/7; patient complains of unrelenting pain making her life miserable. I been using Apligraf to close her major wound down and the major wound has come down to 2 open areas which are small and granulated. However there is erythema both inferiorly and superiorly cratered thick skin with wound issues between these areas extending into the lateral calf. There is erythema here and tenderness. I have been managing this largely as this this was severe stasis dermatitis.  Vein and vascular did not feel that she would benefit from an ablation. She went to see [facility] dermatology some months ago who wondered about a vasculopathy and suggested a biopsy but I am reluctant to go forward with that here. The patient is on Eliquis. Electronic Signature(s) Signed: 08/19/2020 5:12:55 PM By: Linton Ham MD Entered By: Linton Ham on 08/19/2020 09:11:59 -------------------------------------------------------------------------------- Physical Exam Details Patient Name: Date of Service: [name]. 08/19/2020 8:30 A M Medical Record Number: [id number] Patient Account Number: [id number] Date of Birth/Sex: Treating RN: 1933/03/20 (86 y.o. [name] Primary Care Provider: Lajean Green T Other Clinician: Referring Provider: Treating Provider/Extender: [name] in Treatment: 81 Constitutional Patient is hypertensive.. Pulse regular and within target range for patient.Marland Kitchen Respirations regular, non-labored and within target range.. Temperature is normal and within the target range for the patient.Marland Kitchen Appears in no distress. Cardiovascular Pedal pulses are palpable. Significantsignificant venous hypertension multiple dilated venules in her feet.. Notes Wound exam; ankle and calf. Her original large wound on this side has gone down into 2 small open areas with separated skin. This is not really the problem in this area. Inferiorly and superiorly cobblestoned skin which is angry and inflamed. Between this there is eschared areas which no doubt her wound. Erythema and some tenderness. I do not believe this is cellulitis really has not behaved like this. Electronic Signature(s) Signed: 08/19/2020 5:12:55 PM By: Linton Ham MD Entered By: Linton Ham on 08/19/2020 09:14:29 -------------------------------------------------------------------------------- Physician Orders Details Patient Name: Date of Service: [name].  08/19/2020 8:30 A M Medical Record Number: [id number] Patient Account Number: [id number] Date of Birth/Sex: Treating RN: 10/19/1933 (86 y.o. [name] Primary Care Provider: Lajean Green T Other Clinician: Referring Provider: Treating Provider/Extender: [name] in Treatment: 66 Verbal / Phone Orders: No Diagnosis Coding ICD-10 Coding Code Description I87.321 Chronic venous hypertension (idiopathic) with inflammation of right lower extremity L97.828 Non-pressure chronic ulcer of other part of left lower leg with other specified severity I87.322 Chronic venous hypertension (idiopathic) with inflammation of left lower extremity L97.522 Non-pressure chronic ulcer of other part of left foot with fat layer exposed Follow-up Appointments Return Appointment in 1 week.  Dressing Change Frequency Do not change entire dressing for one week. Skin Barriers/Peri-Wound Care Barrier cream Moisturizing lotion TCA Cream or Ointment - to red/inflamed areas liberally Wound Cleansing May shower with protection. - cast protector Primary Wound Dressing Wound #5 Left,Lateral Lower Leg Calcium Alginate with Silver Secondary Dressing Wound #5 Left,Lateral Lower Leg Dry Gauze ABD pad Edema Control 3 Layer Compression System - Left Lower Extremity Avoid standing for long periods of time Elevate legs to the level of the heart or above for 30 minutes daily and/or when sitting, a frequency of: - throughout the day. Exercise regularly Support Garment 20-30 mm/Hg pressure to: - patient to apply farrow wrap 4000 to right leg. Apply in the morning and remove at night. Off-Loading Open toe surgical shoe to: - left foot Consults Dermatology - patient to make appointment with dermatology - (ICD10 [id number] - Non-pressure chronic ulcer of other part of left lower leg with other specified severity) Patient Medications llergies: codeine, adhesive, latex,  Percodan A Notifications Medication Indication Start End 08/19/2020 doxycycline monohydrate DOSE oral 100 mg capsule - 1 capsule oral bid for 2 weeks Electronic Signature(s) Signed: 08/19/2020 5:12:55 PM By: Linton Ham MD Signed: 08/20/2020 5:14:09 PM By: Carlene Coria RN Previous Signature: 08/19/2020 9:20:08 AM Version By: Linton Ham MD Entered By: Carlene Coria on 08/19/2020 09:27:54 -------------------------------------------------------------------------------- Problem List Details Patient Name: Date of Service: [name]. 08/19/2020 8:30 A M Medical Record Number: [id number] Patient Account Number: [id number] Date of Birth/Sex: Treating RN: November 11, 1933 (86 y.o. [name] Primary Care Provider: Lajean Green T Other Clinician: Referring Provider: Treating Provider/Extender: [name] in Treatment: 1 Active Problems ICD-10 Encounter Code Description Active Date MDM Diagnosis I87.321 Chronic venous hypertension (idiopathic) with inflammation of right lower 12/20/2019 No Yes extremity L97.828 Non-pressure chronic ulcer of other part of left lower leg with other specified 03/21/2020 No Yes severity I87.322 Chronic venous hypertension (idiopathic) with inflammation of left lower 02/14/2020 No Yes extremity L97.522 Non-pressure chronic ulcer of other part of left foot with fat layer exposed 01/25/2020 No Yes Inactive Problems ICD-10 Code Description Active Date Inactive Date L97.812 Non-pressure chronic ulcer of other part of right lower leg with fat layer exposed 12/20/2019 12/20/2019 Resolved Problems Electronic Signature(s) Signed: 08/19/2020 5:12:55 PM By: Linton Ham MD Entered By: Linton Ham on 08/19/2020 09:08:46 -------------------------------------------------------------------------------- Progress Note Details Patient Name: Date of Service: Tracy Banker J. 08/19/2020 8:30 A M Medical Record Number: [id number] Patient  Account Number: [id number] Date of Birth/Sex: Treating RN: 11/08/1933 (86 y.o. [name] Primary Care Provider: Lajean Green T Other Clinician: Referring Provider: Treating Provider/Extender: [name] in Treatment: 34 Subjective History of Present Illness (HPI) ADMISSION 12/20/2019 This is an 86 year old woman referred by her primary physician Dr. Felipa Green for review of a wound on her right lateral lower leg. She was actually in this clinic on 2 separate occasions in 2010 and 2012 cared for by [name]. At that point in time she had wounds on her right leg as well. She tells [place] to 1 month ago she noticed a scab building up on her right lateral lower leg this opened into a wound. There was no overt cause of this no trauma, no infection that she is aware of. She has a history of chronic venous insufficiency and wears compression stockings fairly religiously indeed she is done well over the last 8 years since she was last in this clinic. She has been applying Vaseline  on this and a covering phone. This is not progressing towards healing. Past medical history; interstitial lung disease, chronic atrial fibrillation status post pacemaker, osteoarthritis of the left knee, left breast CA, chronic repeat venous insufficiency. She takes Eliquis for her atrial fibrillation stroke prophylaxis. ABI in our clinic was 0.99 on the right 1/15; superficial wound on the right lateral calf in the setting of severe acute skin changes from chronic venous insufficiency and lymphedema. We used Iodoflex last week she complained of a lot of pain 1/22; this is a small but difficult wound on the right lateral calf in the setting of severe chronic venous insufficiency and secondary lymphedema. She continues to state the wounds things hurts when she is up on it but seems to be relieved by putting her leg up. I changed her to [facility] last week because the Iodoflex seem to be  causing stinging. 1/29. This wound appears to be contracting somewhat. Changes of chronic venous insufficiency with secondary lymphedema 2/12; not as good as surface today and slightly bigger. I had to change her from Iodoflex to [facility] because of the stinging pain although she does not think it was any better on the [facility]. ooShe comes into clinic today with a area on the medial left great toe. She said she noticed blood on her sock last Saturday. She had some form of bunion surgery by Dr. Cristie Hem her podiatrist sometime in 2019 she said he "shave the area". I could not find his operative report although I did see reference to the bunion in the area. 2/19; somewhat improved wound on the right lateral lower leg however the wound she came in on the bunion of her left great toe is actually I think larger still somewhat inflamed. We have been using Iodoflex 2/26; not much improvement in either wound area which is the original venous wound on the right lateral lower leg and in the area on the bunion of her left great toe. This still looks somewhat inflamed and tender. We have been using Iodoflex with open much improvement. 3/4; in general both her wounds look better this includes the original venous insufficiency wound on the right lateral lower leg and the area on her tip of her bunion on the left great toe medial aspect of the MTP. Change to Hydrofera Blue last week 3/12; the patient still has a small geographic shaped wound on the right lateral lower leg. We have been using Hydrofera Blue but I changed her to endoform today. She also has the area in the tip of the bunion of the left great toe. We will try Hydrofera Blue here as well 3/19; the small geographic wound on the right lateral lower leg has not filled in. There is some surrounding induration we have noticed this previously. This may be venous inflammation but I wonder about biopsying this if this is not closing up. The  area over the left medial first MTP [bunion deformity] requires debridement. This is not closing in. I am not convinced she is offloading adequately 3/26; right lateral lower leg in the setting of severe venous insufficiency and also an area over the first MTP bunion deformity. No debridement is required. We have been using endoform 4/9; right lateral lower leg wound in the setting of severe venous insufficiency and also a refractory area over the first MTP bunion deformity. The area on the right lateral lower leg is just about closed there is still a minor open area here. She comes  in today with a mirror image area on the left lateral lower leg. She says this started as a scab 2 weeks ago and is gradually morphed into an open wound very similar to what she has on the right leg. She has severe bilateral venous insufficiency with venous hypertension obvious from clinical exam. 4/16; her original wound the right lateral leg wound in the setting of chronic venous hypertension is a small open area but very small. We have been using endoform. Her wound over the left first MTP bunion deformity also appears to be small and closing in. Unfortunately she has a large relative area on the left lateral calf which was new last week. Very adherent debris over this we have been using Iodoflex however that may be contributing to the debris on the wound surface 4/23; the original wound is closed and the area on the left first metatarsal bunion deformity is also almost closed the new area from last week required debridement. She went for her reflux studies that did not take her lower extremity wraps off. This is not helpful. She did have significant reflux in the right common femoral vein. I am not going to press this issue further. She clinically has severe venous hypertension 4/29; the original wound is closed on the right lateral lower calf. The area on the left first metatarsal/bunion deformity has a small slitlike  opening that is still not closed. The real problem here is now wound on the left lateral calf which is necrotic and deep. We used Iodoflex on this wound last time. Silver alginate on the left first toe The patient has Farrow wraps. We should be able to transition the right leg into compression stockings and were doing this today. 5/7; the original wound remains closed on the right lateral calf. The left first metatarsal head still is open. Deterioration in the wound which is the new wound from several weeks ago on the left lateral calf. The patient is not aware how this could have happened. We have been using Sorbact starting last week under 3 layer compression 5/14; the right lateral calf is closed the area on the first met head on the left is closed However the new area from 3 weeks ago on the left lateral calf continues to expand. There is raised nodular tissue around the wound necrotic debris on the surface. Circumference of the wound also looks necrotic. It looks as though there is involvement of the tissue under the skin around the large area of this wound which looks threatened. She is complaining of pain. Culture of this wound I did last week showed rare coag negative staph variant I have never heard of although I am doubtful this has clinical significance 05/09/20-Patient returns with a left lateral calf wound looking about the same, the biopsies reviewed show no atypical features and consistent with trauma or pressure injury or stasis change, patient has appointment to be scheduled with vascular We are using silver alginate and 3 layer compression Patient was started on Trental by dermatology which she has been taking for a week now 6/4; left lateral calf wound. I reviewed the dermatology notes with Dr. Martin Majestic from [facility] dermatology. 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 Put her on  pentoxifylline noteworthy that she is on Eliquis as well. We have been using silver alginate under compression. She has an appointment for repeat vascular studies on 7/6. This gets back to the  fact that they did not take the wrap off on the original studies that were done. I do not believe she has an arterial issue. 6/10; left lateral calf wound. I put her on Hydrofera Blue last week. Some improvement in the surface condition of the wound. She has repeat vascular studies/reflux studies on 7/6. We are trying to get Apligraf through [place] [employer]. Her husband states he looked on the website this is not going to be approved. I am really not sure if this is a "class effect" 6/18; somewhat surprisingly Apligraf was approved and 100% covered. Her husband was also quite shocked by this. Nevertheless we have her repeat vascular studies on 7/6 and I will not be able to apply this until this is done. We are using Hydrofera Blue to the wound on the left. Her original wound on the right lower leg has maintain closure using compression stocking 6/25; repeat vascular studies/venous on 7/6; we will apply the Apligraf I think that week. We are also going to work around a week's vacation they have the first week in August. 7/2; reflux studies on 7/6;. Likely place first Apligraf from the major wound area on 7/9; 7/9; patient was kindly seen by Dr. Donnetta Hutching of vein and vascular to review her circulation status with the refractory wounds in the left leg. At first wounds in this clinic were on the right leg and they heal she now has a juxta lite stocking. Dr. Donnetta Hutching thought she had venous stasis disease with venous ulcerations. He did not feel that there was anything that would suggest to expect approval with a blush ablation of her saphenous vein the saphenous veins were felt to be relatively small in caliber. She was not felt to have an arterial issue Apligraf #1 apply today in the standard fashion 7/23; the  patient came in today complaining of a lot of pain in the left lower leg where the wound is. We applied Apligraf 2 weeks ago I also increased her compression from 3-4 layer wondering about the venous hypertension. She arrives today with her original wound that we have been treating with Apligraf looking quite healthy although there is some surface slough. Problematically she has de-epithelialized and developed erythema below the wound towards the lateral malleolus itself. This is somewhat angry. I think this is venous stasis loss of epithelialization. I do not believe this is infection . Apligraf #2 applied to the original wound area 8/9-Patient returns at 2 weeks for Apligraf #3, the left leg venous leg ulcer area had more slough this time 8/23 Apligraf #4 her wound areas are small however she is still complaining of pain. She has previously been seen by vein and vascular. She has not felt to have an arterial issue she was felt to have venous stasis disease. She originally came here with a wound on her right leg on the right lateral calf this healed out. She then developed a difficult area on the left. She saw dermatology wondered whether this could be a vasculopathy. I am not sure if she is still on pentoxifylline the dermatology suggested I will have to clarify this with 9/7; patient complains of unrelenting pain making her life miserable. I been using Apligraf to close her major wound down and the major wound has come down to 2 open areas which are small and granulated. However there is erythema both inferiorly and superiorly cratered thick skin with wound issues between these areas extending into the lateral calf. There is erythema here and tenderness.  I have been managing this largely as this this was severe stasis dermatitis. Vein and vascular did not feel that she would benefit from an ablation. She went to see [facility] dermatology some months ago who wondered about a vasculopathy and  suggested a biopsy but I am reluctant to go forward with that here. The patient is on Eliquis. Objective Constitutional Patient is hypertensive.. Pulse regular and within target range for patient.Marland Kitchen Respirations regular, non-labored and within target range.. Temperature is normal and within the target range for the patient.Marland Kitchen Appears in no distress. Vitals Time Taken: 8:38 AM, Height: 69 in, Weight: 163 lbs, BMI: 24.1, Temperature: 98 F, Pulse: 62 bpm, Respiratory Rate: 19 breaths/min, Blood Pressure: 164/93 mmHg. Cardiovascular Pedal pulses are palpable. Significantsignificant venous hypertension multiple dilated venules in her feet.. General Notes: Wound exam; ankle and calf. Her original large wound on this side has gone down into 2 small open areas with separated skin. This is not really the problem in this area. Inferiorly and superiorly cobblestoned skin which is angry and inflamed. Between this there is eschared areas which no doubt her wound. Erythema and some tenderness. I do not believe this is cellulitis really has not behaved like this. Integumentary (Hair, Skin) Wound #5 status is Open. Original cause of wound was Gradually Appeared. The wound is located on the Left,Lateral Lower Leg. The wound measures 11cm length x 6.5cm width x 0.2cm depth; 56.156cm^2 area and 11.231cm^3 volume. There is Fat Layer (Subcutaneous Tissue) exposed. There is no tunneling or undermining noted. There is a medium amount of serous drainage noted. The wound margin is flat and intact. There is medium (34-66%) pink granulation within the wound bed. There is a medium (34-66%) amount of necrotic tissue within the wound bed including Adherent Slough. Wound #7 status is Converted. Original cause of wound was Gradually Appeared. The wound is located on the Left,Distal,Lateral Lower Leg. The wound measures 2.2cm length x 2.5cm width x 0.2cm depth; 4.32cm^2 area and 0.864cm^3 volume. There is Fat Layer (Subcutaneous  Tissue) exposed. There is no tunneling or undermining noted. The wound margin is distinct with the outline attached to the wound base. There is medium (34-66%) pink granulation within the wound bed. There is a medium (34-66%) amount of necrotic tissue within the wound bed including Adherent Slough. 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 There was a Three Layer Compression Therapy Procedure by Carlene Coria, RN. Post procedure Diagnosis Wound #5: Same as Pre-Procedure Plan Follow-up Appointments: Return Appointment in 2 weeks. - MD visit Nurse Visit: - 1 week Dressing Change Frequency: Do not change entire dressing for one week. Skin Barriers/Peri-Wound Care: Barrier cream Moisturizing lotion TCA Cream or Ointment - to red/inflamed areas liberally Wound Cleansing: May shower with protection. - cast protector Primary Wound Dressing: Wound #5 Left,Lateral Lower Leg: Calcium Alginate with Silver Secondary Dressing: Wound #5 Left,Lateral Lower Leg: Dry Gauze ABD pad Edema Control: 3 Layer Compression System - Left Lower Extremity Avoid standing for long periods of time Elevate legs to the level of the heart or above for 30 minutes daily and/or when sitting, a frequency of: - throughout  the day. Exercise regularly Support Garment 20-30 mm/Hg pressure to: - patient to apply farrow wrap 4000 to right leg. Apply in the morning and remove at night. Off-Loading: Open toe surgical shoe to: - left foot Consults ordered were: Dermatology - patient to  make appointment with dermatology The following medication(s) was prescribed: doxycycline monohydrate oral 100 mg capsule 1 capsule oral bid for 2 weeks starting 08/19/2020 #1 the patient is complaining of unrelenting pain in this area. I have been treating his this is those this were stasis dermatitis in the setting of severe venous hypertension and probably some degree of lymphedema. Her wound has gotten a lot better but the tissue around here is not. 2. I did not put the final apical graft on today. I used TCA liberally to the erythematous area teeth silver alginate to the wound under 3 layer compression 3. I would like her seen again by dermatology if there is an alternative diagnosis to what I have discussed then it would require a biopsy to determine this. I am reluctant to do this here. I would rather this be done by dermatology so that there is better clinical pathologic coordination. She is on Eliquis however that is not the major issue. 4. The patient is already been seen by vein and vascular they did not think she would benefit from an ablation and did not think she had arterial issues 5. I put her on doxycycline today both as an antibiotic but predominantly as an anti-inflammatory. I wondered about pentoxifylline 6. Follow-up next week Electronic Signature(s) Signed: 08/19/2020 9:20:37 AM By: Linton Ham MD Entered By: Linton Ham on 08/19/2020 09:20:36 -------------------------------------------------------------------------------- SuperBill Details Patient Name: Date of Service: [name] 08/19/2020 Medical Record Number: [id number] Patient Account Number: [id number] Date of Birth/Sex: Treating RN: [birth year] (86 y.o. [name] Primary Care Provider: Lajean Green T Other Clinician: Referring Provider: Treating Provider/Extender: [name] in Treatment: 34 Diagnosis Coding ICD-10 Codes Code Description I87.321 Chronic venous  hypertension (idiopathic) with inflammation of right lower extremity L97.828 Non-pressure chronic ulcer of other part of left lower leg with other specified severity I87.322 Chronic venous hypertension (idiopathic) with inflammation of left lower extremity L97.522 Non-pressure chronic ulcer of other part of left foot with fat layer exposed Facility Procedures The patient participates with Medicare or their insurance follows the Medicare Facility Guidelines: CPT4 Code Description Modifier Quantity [id number] [phone] - WOUND CARE VISIT-LEV 3 EST PT 1 Physician Procedures : CPT4 Code Description Modifier [id number] - WC PHYS LEVEL 4 - EST PT ICD-10 Diagnosis Description I87.321 Chronic venous hypertension (idiopathic) with inflammation of right lower extremity L97.828 Non-pressure chronic ulcer of other part of left  lower leg with other specified severity I87.322 Chronic venous hypertension (idiopathic) with inflammation of left lower extremity Quantity: 1 Electronic Signature(s) Signed: 08/19/2020 5:12:55 PM By: Linton Ham MD Signed: 08/20/2020 5:14:09 PM By: Carlene Coria RN Entered By: Carlene Coria on 08/19/2020 00:93:81

## 2020-08-20 NOTE — Progress Notes (Signed)
CLAIRE, DOLORES ([id number]) Visit Report for 08/20/2020 SuperBill Details Patient Name: Date of Service: [place], [name] 08/20/2020 Medical Record Number: [id number] Patient Account Number: [id number] Date of Birth/Sex: Treating RN: [birth year] (86 y.o. [name] Primary Care Provider: Lajean Manes T Other Clinician: Referring Provider: Treating Provider/Extender: Nelda Severe Weeks in Treatment: 34 Diagnosis Coding ICD-10 Codes Code Description [phone] Chronic venous hypertension (idiopathic) with inflammation of right lower extremity L97.828 Non-pressure chronic ulcer of other part of left lower leg with other specified severity I87.322 Chronic venous hypertension (idiopathic) with inflammation of left lower extremity L97.522 Non-pressure chronic ulcer of other part of left foot with fat layer exposed Facility Procedures The patient participates with Medicare or their insurance follows the Medicare Facility Guidelines CPT4 Code Description Modifier Quantity [phone] (Facility Use Only) [phone] - APPLY [place] 1 Electronic Signature(s) Signed: 08/20/2020 5:14:09 PM By: Carlene Coria RN Signed: 08/20/2020 5:46:37 PM By: Worthy Keeler PA-C Entered By: Carlene Coria on 08/20/2020 11:40:54

## 2020-08-20 NOTE — Progress Notes (Signed)
[name], [name] ([id number]) Visit Report for 08/20/2020 Arrival Information Details Patient Name: Date of Service: [name], [name] 08/20/2020 11:15 A M Medical Record Number: [id number] Patient Account Number: [id number] Date of Birth/Sex: Treating RN: [name] (86 y.o. [name] Primary Care Chella Chapdelaine: Lajean Manes T Other Clinician: Referring Javoni Lucken: Treating Caera Enwright/Extender: Nelda Severe Weeks in Treatment: 76 Visit Information History Since Last Visit Added or deleted any medications: No Patient Arrived: Ambulatory Any new allergies or adverse reactions: No Arrival Time: 11:14 Had a fall or experienced change in No Accompanied By: husband activities of daily living that may affect Transfer Assistance: None risk of falls: Patient Identification Verified: Yes Signs or symptoms of abuse/neglect since last visito No Secondary Verification Process Completed: Yes Hospitalized since last visit: No Patient Requires Transmission-Based Precautions: No Implantable device outside of the clinic excluding No Patient Has Alerts: Yes cellular tissue based products placed in the center Patient Alerts: Patient on Blood Thinner since last visit: Right ABI:0.99 Has Dressing in Place as Prescribed: Yes Pain Present Now: Yes Electronic Signature(s) Signed: 08/20/2020 1:03:45 PM By: Sandre Kitty Entered By: Sandre Kitty on 08/20/2020 11:15:31 -------------------------------------------------------------------------------- Compression Therapy Details Patient Name: Date of Service: [name]. 08/20/2020 11:15 A M Medical Record Number: [id number] Patient Account Number: [id number] Date of Birth/Sex: Treating RN: 08-Tracy-[name] (86 y.o. [name] Primary Care Gurjit Loconte: Lajean Manes T Other Clinician: Referring Paisli Silfies: Treating Jameshia Hayashida/Extender: Mike Craze T Weeks in Treatment: 77 Compression Therapy Performed for Wound  Assessment: Wound #5 Left,Lateral Lower Leg Performed By: Clinician Carlene Coria, RN Compression Type: Three Layer Electronic Signature(s) Signed: 08/20/2020 5:14:09 PM By: Carlene Coria RN Entered By: Carlene Coria on 08/20/2020 11:39:02 -------------------------------------------------------------------------------- Encounter Discharge Information Details Patient Name: Date of Service: [name], [place]. 08/20/2020 11:15 A M Medical Record Number: [id number] Patient Account Number: [id number] Date of Birth/Sex: Treating RN: [birth year], [name] (86 y.o. [name] Primary Care Baily Hovanec: Other Clinician: Patrina Levering Referring Verniece Encarnacion: Treating Ravi Tuccillo/Extender: Nelda Severe Weeks in Treatment: 35 Encounter Discharge Information Items Discharge Condition: Stable Ambulatory Status: Ambulatory Discharge Destination: Home Transportation: Private Auto Accompanied By: husband Schedule Follow-up Appointment: Yes Clinical Summary of Care: Patient Declined Electronic Signature(s) Signed: 08/20/2020 5:14:09 PM By: Carlene Coria RN Entered By: Carlene Coria on 08/20/2020 11:40:23 -------------------------------------------------------------------------------- Patient/Caregiver Education Details Patient Name: Date of Service: [name], [name] 9/8/2021andnbsp11:15 A M Medical Record Number: [id number] Patient Account Number: [id number] Date of Birth/Gender: Treating RN: 13-Tracy-[name] (86 y.o. [name] Primary Care Physician: Lajean Manes T Other Clinician: Referring Physician: Treating Physician/Extender: Maryruth Eve in Treatment: 71 Education Assessment Education Provided To: Patient Education Topics Provided Wound/Skin Impairment: Methods: Explain/Verbal Responses: State content correctly [employer]) Signed: 08/20/2020 5:14:09 PM By: Carlene Coria RN Entered By: Carlene Coria on 08/20/2020  11:40:04 -------------------------------------------------------------------------------- Wound Assessment Details Patient Name: Date of Service: [name], [name] 08/20/2020 11:15 A M Medical Record Number: [id number] Patient Account Number: [id number] Date of Birth/Sex: Treating RN: [birth year] (86 y.o. [name] Primary Care Claudeen Leason: Lajean Manes T Other Clinician: Referring Chinara Hertzberg: Treating Eagan Shifflett/Extender: Fredderick Erb, Hal T Weeks in Treatment: 34 Wound Status Wound Number: 5 Primary Etiology: Venous Leg Ulcer Wound Location: Left, Lateral Lower Leg Wound Status: Open Wounding Event: Gradually Appeared Date Acquired: 03/21/2020 Weeks Of Treatment: 21 Clustered Wound: No Wound Measurements Length: (cm) 11 Width: (cm) 6.5 Depth: (cm) 0.2 Area: (cm) 56.156 Volume: (  cm) 11.231 % Reduction in Area: -11064.2% % Reduction in Volume: -22362% Wound Description Classification: Full Thickness Without Exposed Support Structu res Treatment Notes Wound #5 (Left, Lateral Lower Leg) 1. Cleanse With Wound Cleanser Soap and water 2. Periwound Care TCA Cream 3. Primary Dressing Applied Calcium Alginate Ag 4. Secondary Dressing ABD Pad Dry Gauze 6. Support Layer Applied 3 layer compression wrap Notes netting Electronic Signature(s) Signed: 08/20/2020 1:03:45 PM By: Sandre Kitty Signed: 08/20/2020 5:28:47 PM By: [name] RN, BSN Entered By: Sandre Kitty on 08/20/2020 11:15:58 -------------------------------------------------------------------------------- [place] Details Patient Name: Date of Service: [name], [name]. 08/20/2020 11:15 A M Medical Record Number: [id number] Patient Account Number: [id number] Date of Birth/Sex: Treating RN: [name], [name] (86 y.o. [name] Primary Care Melisse Caetano: Lajean Manes T Other Clinician: Referring Keasia Dubose: Treating Rodderick Holtzer/Extender: Fredderick Erb, Hal T Weeks in Treatment:  82 Vital Signs Time Taken: 11:15 Temperature (F): 97.7 Height (in): 69 Pulse (bpm): 76 Weight (lbs): 163 Respiratory Rate (breaths/min): 19 Body Mass Index (BMI): 24.1 Blood Pressure (mmHg): 147/85 Reference Range: 80 - 120 mg / dl Electronic Signature(s) Signed: 08/20/2020 1:03:45 PM By: Sandre Kitty Entered By: Sandre Kitty on 08/20/2020 11:15:49

## 2020-08-25 ENCOUNTER — Encounter (HOSPITAL_COMMUNITY): Payer: Self-pay | Admitting: Emergency Medicine

## 2020-08-25 ENCOUNTER — Emergency Department (HOSPITAL_COMMUNITY)
Admission: EM | Admit: 2020-08-25 | Discharge: 2020-08-26 | Disposition: A | Discharge status: Home / Self Care | Payer: Medicare Other | Attending: Emergency Medicine | Admitting: Emergency Medicine

## 2020-08-25 ENCOUNTER — Other Ambulatory Visit: Payer: Self-pay

## 2020-08-25 ENCOUNTER — Encounter (HOSPITAL_BASED_OUTPATIENT_CLINIC_OR_DEPARTMENT_OTHER): Payer: Medicare Other | Admitting: Internal Medicine

## 2020-08-25 DIAGNOSIS — Z950 Presence of cardiac pacemaker: Secondary | ICD-10-CM | POA: Insufficient documentation

## 2020-08-25 DIAGNOSIS — Z853 Personal history of malignant neoplasm of breast: Secondary | ICD-10-CM | POA: Insufficient documentation

## 2020-08-25 DIAGNOSIS — E039 Hypothyroidism, unspecified: Secondary | ICD-10-CM | POA: Diagnosis not present

## 2020-08-25 DIAGNOSIS — I5031 Acute diastolic (congestive) heart failure: Secondary | ICD-10-CM | POA: Insufficient documentation

## 2020-08-25 DIAGNOSIS — Z7901 Long term (current) use of anticoagulants: Secondary | ICD-10-CM | POA: Diagnosis not present

## 2020-08-25 DIAGNOSIS — Z79899 Other long term (current) drug therapy: Secondary | ICD-10-CM | POA: Insufficient documentation

## 2020-08-25 DIAGNOSIS — R58 Hemorrhage, not elsewhere classified: Secondary | ICD-10-CM | POA: Insufficient documentation

## 2020-08-25 DIAGNOSIS — Z91040 Latex allergy status: Secondary | ICD-10-CM | POA: Insufficient documentation

## 2020-08-25 DIAGNOSIS — T148XXA Other injury of unspecified body region, initial encounter: Secondary | ICD-10-CM

## 2020-08-25 DIAGNOSIS — I110 Hypertensive heart disease with heart failure: Secondary | ICD-10-CM | POA: Insufficient documentation

## 2020-08-25 NOTE — ED Notes (Signed)
Bandage reinforced with ABD and ace wrap

## 2020-08-25 NOTE — ED Triage Notes (Signed)
Pt presents with bleeding wound to L heel. Pt states she is taking Eliquis, had biopsy last week, was seen at wound center today for bandage change is now bleeding through bandages, pt reports large pool of blood on floor at home.

## 2020-08-26 ENCOUNTER — Ambulatory Visit (INDEPENDENT_AMBULATORY_CARE_PROVIDER_SITE_OTHER): Payer: Medicare Other | Admitting: *Deleted

## 2020-08-26 ENCOUNTER — Other Ambulatory Visit: Payer: Self-pay

## 2020-08-26 ENCOUNTER — Encounter (HOSPITAL_BASED_OUTPATIENT_CLINIC_OR_DEPARTMENT_OTHER): Payer: Medicare Other | Admitting: Internal Medicine

## 2020-08-26 DIAGNOSIS — I4819 Other persistent atrial fibrillation: Secondary | ICD-10-CM

## 2020-08-26 LAB — CBC WITH DIFFERENTIAL/PLATELET
Abs Immature Granulocytes: 0.01 10*3/uL (ref 0.00–0.07)
Basophils Absolute: 0 10*3/uL (ref 0.0–0.1)
Basophils Relative: 1 %
Eosinophils Absolute: 0.2 10*3/uL (ref 0.0–0.5)
Eosinophils Relative: 5 %
HCT: 35 % — ABNORMAL LOW (ref 36.0–46.0)
Hemoglobin: 10.9 g/dL — ABNORMAL LOW (ref 12.0–15.0)
Immature Granulocytes: 0 %
Lymphocytes Relative: 29 %
Lymphs Abs: 1.5 10*3/uL (ref 0.7–4.0)
MCH: 28.8 pg (ref 26.0–34.0)
MCHC: 31.1 g/dL (ref 30.0–36.0)
MCV: 92.6 fL (ref 80.0–100.0)
Monocytes Absolute: 0.5 10*3/uL (ref 0.1–1.0)
Monocytes Relative: 10 %
Neutro Abs: 3 10*3/uL (ref 1.7–7.7)
Neutrophils Relative %: 55 %
Platelets: 239 10*3/uL (ref 150–400)
RBC: 3.78 MIL/uL — ABNORMAL LOW (ref 3.87–5.11)
RDW: 15.3 % (ref 11.5–15.5)
WBC: 5.3 10*3/uL (ref 4.0–10.5)
nRBC: 0 % (ref 0.0–0.2)

## 2020-08-26 LAB — CUP PACEART REMOTE DEVICE CHECK
Battery Remaining Longevity: 101 mo
Battery Voltage: 2.99 V
Brady Statistic AP VP Percent: 0 %
Brady Statistic AP VS Percent: 0 %
Brady Statistic AS VP Percent: 78.69 %
Brady Statistic AS VS Percent: 21.31 %
Brady Statistic RA Percent Paced: 0 %
Brady Statistic RV Percent Paced: 78.69 %
Date Time Interrogation Session: 20210914044817
Implantable Lead Implant Date: 20180905
Implantable Lead Implant Date: 20180905
Implantable Lead Location: 753859
Implantable Lead Location: 753860
Implantable Lead Model: 3830
Implantable Lead Model: 5076
Implantable Pulse Generator Implant Date: 20180905
Lead Channel Impedance Value: 285 Ohm
Lead Channel Impedance Value: 342 Ohm
Lead Channel Impedance Value: 399 Ohm
Lead Channel Impedance Value: 551 Ohm
Lead Channel Pacing Threshold Amplitude: 0.5 V
Lead Channel Pacing Threshold Pulse Width: 0.4 ms
Lead Channel Sensing Intrinsic Amplitude: 1.375 mV
Lead Channel Sensing Intrinsic Amplitude: 1.375 mV
Lead Channel Sensing Intrinsic Amplitude: 2.875 mV
Lead Channel Sensing Intrinsic Amplitude: 3.125 mV
Lead Channel Setting Pacing Amplitude: 2.5 V
Lead Channel Setting Pacing Pulse Width: 0.4 ms
Lead Channel Setting Sensing Sensitivity: 0.9 mV

## 2020-08-26 MED ORDER — "THROMBI-PAD 3""X3"" EX PADS"
1.0000 | MEDICATED_PAD | Freq: Once | CUTANEOUS | Status: AC
  Administered 2020-08-26: 1 via TOPICAL

## 2020-08-26 NOTE — Discharge Instructions (Signed)
Keep the dressing on until you can be seen in the wound clinic.  Return if you have any problems.

## 2020-08-26 NOTE — ED Provider Notes (Signed)
[facility] EMERGENCY DEPARTMENT Provider Note   CSN: [id number] Arrival date & time: 08/25/20  1946   History Chief Complaint  Patient presents with  . Wound Check    [name] is a 86 y.o. female.  The history is provided by the patient.  Wound Check  She has history of hypertension, interstitial lung disease, atrial flutter anticoagulated on apixaban, diastolic heart failure and had a biopsy of a skin lesion in the left lower leg 6 days ago.  She went to the wound care center yesterday for weekly dressing change and that noted bleeding from the biopsy site yesterday evening.  She is not on any aspirin or other antiplatelet agents.  She denies any other trauma.  Past Medical History:  Diagnosis Date  . Allergic rhinitis   . Anemia   . Breast cancer, left breast ([place]) 01/15/15   Invasive Mammary  . Chronic venous insufficiency   . Hematuria    negative workup - Dr. Reece Agar  . Hypertension   . Migraine headache   . MVP (mitral valve prolapse)   . PAF (paroxysmal atrial fibrillation) ([place])   . SVT (supraventricular tachycardia) ([place]) 06/2010   s/p AVNRT ablation  . Wears glasses     Patient Active Problem List   Diagnosis Date Noted  . Pacemaker 01/10/2020  . Pain in left knee 09/27/2018  . Acute diastolic CHF (congestive heart failure) ([place]) 08/11/2017  . Acute CHF (congestive heart failure) ([place]) 08/11/2017  . Acute CHF ([place]) 08/11/2017  . Persistent atrial fibrillation ([place])   . ILD (interstitial lung disease) ([place]) 02/21/2017  . CHF (congestive heart failure) ([place]) 02/21/2017  . Dyspnea and respiratory abnormality 02/11/2017  . Atrial flutter ([place]) 02/04/2017  . Chronic atrial fibrillation ([place]) 01/19/2017  . Abnormality of gait and mobility 01/19/2017  . Hypothyroidism 01/19/2017  . Other specified disorders of bone density and structure, unspecified site 01/19/2017  . Genetic testing 03/13/2015  . Paresthesia of skin 03/03/2015  .  Breast cancer of lower-outer quadrant of left female breast ([place]) 02/06/2015  . Hematuria   . PAF (paroxysmal atrial fibrillation) ([place])   . Chronic venous insufficiency   . SVT (supraventricular tachycardia) ([place]) 06/12/2010  . Essential hypertension 04/22/2010  . MITRAL VALVE PROLAPSE 04/22/2010  . Atypical atrial flutter ([place]) 04/22/2010  . SUPRAVENTRICULAR TACHYCARDIA 04/22/2010    Past Surgical History:  Procedure Laterality Date  . A-FLUTTER ABLATION N/A 02/04/2017   Procedure: A-Flutter Ablation;  Surgeon: Evans Lance, MD;  Location: [facility] CV LAB;  Service: Cardiovascular;  Laterality: N/A;  . ABDOMINAL HYSTERECTOMY    . AV NODE ABLATION N/A 08/17/2017   Procedure: AV Node Ablation;  Surgeon: Evans Lance, MD;  Location: [facility] [facility] CV LAB;  Service: Cardiovascular;  Laterality: N/A;  . BREAST LUMPECTOMY WITH RADIOACTIVE SEED LOCALIZATION Left 02/14/2015   Procedure: BREAST LUMPECTOMY WITH RADIOACTIVE SEED LOCALIZATION;  Surgeon: Alphonsa Overall, MD;  Location: [facility];  Service: General;  Laterality: Left;  . BREAST SURGERY  1990   rt br bx  . CARDIAC CATHETERIZATION  2011   ablasion  . CARDIOVERSION N/A 08/10/2017   Procedure: CARDIOVERSION;  Surgeon: Jerline Pain, MD;  Location[facility] ENDOSCOPY;  Service: Cardiovascular;  Laterality: N/A;  . COLONOSCOPY    . DILATION AND CURETTAGE OF UTERUS    . Left Breast Biopsy  01/15/15  . PACEMAKER IMPLANT N/A 08/17/2017   Procedure: Pacemaker Implant;  Surgeon: Evans Lance, MD;  Location: [facility]  INVASIVE CV LAB;  Service: Cardiovascular;  Laterality: N/A;  . TUBAL LIGATION       OB History   No obstetric history on file.     Family History  Problem Relation Age of Onset  . Hypertension Mother   . Heart disease Mother   . Arthritis Mother   . Other Father        hardening of the arteries  . Heart Problems Sister        pacemaker  . Heart attack Brother   . Throat cancer Paternal Uncle   . Breast cancer  Sister        early 109s  . Alzheimer's disease Sister   . Pulmonary disease Sister   . Breast cancer Daughter 64       Negative genetic testing   . Breast cancer Paternal Aunt        dx over 60  . Cancer Paternal Aunt        cancer in her leg  . Breast cancer Cousin        multiple paternal cousin    Social History   Tobacco Use  . Smoking status: Never Smoker  . Smokeless tobacco: Never Used  . Tobacco comment: did smoke about 2cigs long time about 54 plus years ago  Vaping Use  . Vaping Use: Never used  Substance Use Topics  . Alcohol use: Yes    Alcohol/week: 0.0 standard drinks    Comment: once a week  . Drug use: No    Home Medications Prior to Admission medications   Medication Sig Start Date End Date Taking? Authorizing Provider  antiseptic oral rinse (BIOTENE) LIQD 15 mLs by Mouth Rinse route as needed for dry mouth.    [provider]  Carboxymethylcellul-Glycerin (LUBRICATING EYE DROPS OP) Apply 1 drop to eye daily as needed (dry eyes).    [provider]  Cyanocobalamin (B-12) 1000 MCG TABS Take 1 tablet by mouth daily.    [provider]  ELIQUIS 5 MG TABS tablet TAKE 1 TABLET BY MOUTH  TWICE DAILY 06/17/20   Evans Lance, MD  hydrochlorothiazide (HYDRODIURIL) 25 MG tablet Take 1 tablet (25 mg total) by mouth daily. 02/06/20   Evans Lance, MD  HYDROcodone-acetaminophen [facility]) 10-325 MG tablet Take 1 tablet by mouth 3 (three) times daily as needed for pain. 08/11/20   [provider]  levothyroxine (SYNTHROID, LEVOTHROID) 88 MCG tablet Take 88 mcg by mouth daily before breakfast.  07/31/18   [provider]  losartan (COZAAR) 50 MG tablet TAKE 1 TABLET BY MOUTH  DAILY 12/26/18   Evans Lance, MD  MYRBETRIQ 50 MG TB24 tablet Take 50 mg by mouth daily. 04/29/20   [provider]  propafenone (RYTHMOL SR) 225 MG 12 hr capsule Take 1 capsule (225 mg total) by mouth 2 (two) times daily. 08/12/20   Evans Lance,  MD  raNITIdine HCl (WAL-ZAN 75 PO) Take 1 tablet by mouth daily as needed.     [provider]  traMADol (ULTRAM) 50 MG tablet Take 50 mg by mouth 3 (three) times daily as needed. 04/10/20   [provider]  triamcinolone cream (KENALOG) 0.1 % Apply 1 application topically daily as needed.  07/17/19   [provider]  Vitamin D, Ergocalciferol, (DRISDOL) 50000 units CAPS capsule Take 50,000 Units by mouth every 7 (seven) days.    [provider]    Allergies    Adhesive [tape], Codeine, Latex, and  Percodan [oxycodone-aspirin]  Review of Systems   Review of Systems  All other systems reviewed and are negative.   Physical Exam Updated Vital Signs BP 101/65   Pulse 66   Temp 98.2 F (36.8 C) (Oral)   Resp 19   LMP  (LMP Unknown)   SpO2 98%   Physical Exam Vitals and nursing note reviewed.   86 year old female, resting comfortably and in no acute distress. Vital signs are normal. Oxygen saturation is 98%, which is normal. Head is normocephalic and atraumatic. PERRLA, EOMI. Oropharynx is clear. Neck is nontender and supple without adenopathy or JVD. Back is nontender and there is no CVA tenderness. Lungs are clear without rales, wheezes, or rhonchi. Chest is nontender. Heart has regular rate and rhythm without murmur. Abdomen is soft, flat, nontender without masses or hepatosplenomegaly and peristalsis is normoactive. Extremities have no cyanosis or edema, full range of motion is present.  Unna boot is present on the left lower leg with blood staining of the heel area.  This is removed and biopsy site appears to be healing appropriately with slow trickle of blood seen. Skin is warm and dry without rash. Neurologic: Mental status is normal, cranial nerves are intact, there are no motor or sensory deficits.    ED Results / Procedures / Treatments   Labs (all labs ordered are listed, but only abnormal results are displayed) Labs Reviewed  CBC WITH  DIFFERENTIAL/PLATELET - Abnormal; Notable for the following components:      Result Value   RBC 3.78 (*)    Hemoglobin 10.9 (*)    HCT 35.0 (*)    All other components within normal limits   Procedures Procedures  Medications Ordered in ED Medications  Thrombi-Pad 3"X3" pad 1 each (has no administration in time range)    ED Course  I have reviewed the triage vital signs and the nursing notes.  Pertinent lab results that were available during my care of the patient were reviewed by me and considered in my medical decision making (see chart for details).  MDM Rules/Calculators/A&P Bleeding from biopsy site.  Blood pressure is low for someone who has history of hypertension, will check hemoglobin and hematocrit to make sure she has not had significant blood loss.  We will try to control bleeding with thrombin pad.  Old records are reviewed confirming longstanding atrial fibrillation/atrial flutter with chronic anticoagulation with apixaban.  There has been a slight drop in hemoglobin compared with last value, but this is not enough to warrant blood transfusion.  She remains hemodynamically stable.  There was no bleeding through the dressing approximately 90 minutes following application of thrombin pad.  Dressing was left in place and she is instructed to go to the wound care clinic for them to replace the Unna boot.  Final Clinical Impression(s) / ED Diagnoses Final diagnoses:  Bleeding from wound  Chronic anticoagulation    Rx / DC Orders ED Discharge Orders    None       Delora Fuel, MD 08/65/78 [phone]

## 2020-08-27 NOTE — Progress Notes (Signed)
[name], [name] ([id number]) Visit Report for 08/26/2020 Arrival Information Details Patient Name: Date of Service: [name] 08/26/2020 1:45 PM Medical Record Number: [id number] Patient Account Number: [id number] Date of Birth/Sex: Treating RN: 1933-09-13 (86 y.o. [name] Primary Care Almetta Liddicoat: Lajean Manes T Other Clinician: Referring Tais Koestner: Treating Garet Hooton/Extender: Evelena Peat in Treatment: 60 Visit Information History Since Last Visit Added or deleted any medications: No Patient Arrived: Ambulatory Any new allergies or adverse reactions: No Arrival Time: 14:00 Had a fall or experienced change in No Accompanied By: husband activities of daily living that may affect Transfer Assistance: None risk of falls: Patient Identification Verified: Yes Signs or symptoms of abuse/neglect since last visito No Secondary Verification Process Completed: Yes Hospitalized since last visit: No Patient Requires Transmission-Based Precautions: No Implantable device outside of the clinic excluding No Patient Has Alerts: Yes cellular tissue based products placed in the center Patient Alerts: Patient on Blood Thinner since last visit: Right ABI:0.99 Has Dressing in Place as Prescribed: No Has Compression in Place as Prescribed: No Pain Present Now: No Electronic Signature(s) Signed: 08/27/2020 5:57:30 PM By: [name] RN, BSN Entered By: [name] on 08/26/2020 14:46:45 -------------------------------------------------------------------------------- Compression Therapy Details Patient Name: Date of Service: [name]. 08/26/2020 1:45 PM Medical Record Number: [id number] Patient Account Number: [id number] Date of Birth/Sex: Treating RN: June 09, 1933 (86 y.o. [name] Primary Care Tashe Purdon: Patrina Levering Other Clinician: Referring Cornelio Parkerson: Treating Cordarro Spinnato/Extender: [name] in Treatment:  35 Compression Therapy Performed for Wound Assessment: Wound #5 Left,Lateral Lower Leg Performed By: Clinician [name], RN Compression Type: Three Hydrologist) Signed: 08/27/2020 5:57:30 PM By: [name] RN, BSN Entered By: [name] on 08/26/2020 14:03:41 -------------------------------------------------------------------------------- Encounter Discharge Information Details Patient Name: Date of Service: [name]. 08/26/2020 1:45 PM Medical Record Number: [id number] Patient Account Number: [id number] Date of Birth/Sex: Treating RN: 1933-09-16 (86 y.o. [name] Primary Care Lamanda Rudder: Patrina Levering Other Clinician: Referring Dastan Krider: Treating Annebelle Bostic/Extender: Evelena Peat in Treatment: 37 Encounter Discharge Information Items Discharge Condition: Stable Ambulatory Status: Ambulatory Discharge Destination: Home Transportation: Private Auto Accompanied By: husband Schedule Follow-up Appointment: Yes Clinical Summary of Care: Patient Declined Electronic Signature(s) Signed: 08/27/2020 5:57:30 PM By: [name] RN, BSN Entered By: [name] on 08/26/2020 14:48:25 -------------------------------------------------------------------------------- Wound Assessment Details Patient Name: Date of Service: [name]. 08/26/2020 1:45 PM Medical Record Number: [id number] Patient Account Number: [id number] Date of Birth/Sex: Treating RN: 07/31/1933 (86 y.o. [name] Primary Care Skylah Delauter: Lajean Manes T Other Clinician: Referring Tykel Badie: Treating Segundo Makela/Extender: [name] in Treatment: 35 Wound Status Wound Number: 5 Primary Venous Leg Ulcer Etiology: Wound Location: Left, Lateral Lower Leg Wound Open Wounding Event: Gradually Appeared Status: Date Acquired: 03/21/2020 Comorbid Cataracts, Anemia, Arrhythmia, Congestive Heart Failure, Weeks Of  Treatment: 22 History: Hypertension, Peripheral Venous Disease, Received Radiation Clustered Wound: No Wound Measurements Length: (cm) 9.5 Width: (cm) 2.5 Depth: (cm) 0.2 Area: (cm) 18.653 Volume: (cm) 3.731 % Reduction in Area: -3608.3% % Reduction in Volume: -7362% Epithelialization: Small (1-33%) Tunneling: No Undermining: No Wound Description Classification: Full Thickness Without Exposed Support Structures Wound Margin: Distinct, outline attached Exudate Amount: Large Exudate Type: Serosanguineous Exudate Color: red, brown Foul Odor After Cleansing: No Slough/Fibrino Yes Wound Bed Granulation Amount: Medium (34-66%) Exposed Structure Granulation Quality: Red, Pink Fascia Exposed: No Necrotic Amount: Medium (34-66%) Fat Layer (Subcutaneous Tissue) Exposed:  Yes Necrotic Quality: Adherent Slough Tendon Exposed: No Muscle Exposed: No Joint Exposed: No Bone Exposed: No Electronic Signature(s) Signed: 08/27/2020 5:57:30 PM By: [name] RN, BSN Entered By: [name] on 08/26/2020 14:03:06

## 2020-08-27 NOTE — Progress Notes (Signed)
REAGAN, BEHLKE ([id number]) Visit Report for 08/25/2020 Debridement Details Patient Name: Date of Service: [place], [name] 08/25/2020 8:30 A M Medical Record Number: [id number] Patient Account Number: [id number] Date of Birth/Sex: Treating RN: [birth year] (86 y.o. [name] Primary Care Provider: Lajean Manes T Other Clinician: Referring Provider: Treating Provider/Extender: Evelena Peat in Treatment: 35 Debridement Performed for Assessment: Wound #5 Left,Lateral Lower Leg Performed By: Physician Ricard Dillon., MD Debridement Type: Debridement Severity of Tissue Pre Debridement: Fat layer exposed Level of Consciousness (Pre-procedure): Awake and Alert Pre-procedure Verification/Time Out Yes - 09:12 Taken: Start Time: 09:12 T Area Debrided (L x W): otal 4 (cm) x 2 (cm) = 8 (cm) Tissue and other material debrided: Viable, Non-Viable, Slough, Subcutaneous, Slough Level: Skin/Subcutaneous Tissue Debridement Description: Excisional Instrument: Curette Bleeding: Minimum Hemostasis Achieved: Pressure End Time: 09:13 Procedural Pain: 0 Post Procedural Pain: 0 Response to Treatment: Procedure was tolerated well Level of Consciousness (Post- Awake and Alert procedure): Post Debridement Measurements of Total Wound Length: (cm) 9.5 Width: (cm) 2.5 Depth: (cm) 0.2 Volume: (cm) 3.731 Character of Wound/Ulcer Post Debridement: Improved Severity of Tissue Post Debridement: Fat layer exposed Post Procedure Diagnosis Same as Pre-procedure Electronic Signature(s) Signed: 08/26/2020 5:12:45 PM By: Linton Ham MD Signed: 08/27/2020 5:57:30 PM By: [name] RN, BSN Entered By: Linton Ham on 08/25/2020 09:40:05 -------------------------------------------------------------------------------- HPI Details Patient Name: Date of Service: [name] 08/25/2020 8:30 A M Medical Record Number: [id number] Patient Account Number:  [id number] Date of Birth/Sex: Treating RN: Apr 03, 1933 (86 y.o. [name] Primary Care Provider: Patrina Levering Other Clinician: Referring Provider: Treating Provider/Extender: Evelena Peat in Treatment: 35 History of Present Illness HPI Description: ADMISSION 12/20/2019 This is an 86 year old woman referred by her primary physician Dr. Felipa Eth for review of a wound on her right lateral lower leg. She was actually in this clinic on 2 separate occasions in 2010 and 2012 cared for by Dr. Sherilyn Cooter. At that point in time she had wounds on her right leg as well. She tells [place] to 1 month ago she noticed a scab building up on her right lateral lower leg this opened into a wound. There was no overt cause of this no trauma, no infection that she is aware of. She has a history of chronic venous insufficiency and wears compression stockings fairly religiously indeed she is done well over the last 8 years since she was last in this clinic. She has been applying Vaseline on this and a covering phone. This is not progressing towards healing. Past medical history; interstitial lung disease, chronic atrial fibrillation status post pacemaker, osteoarthritis of the left knee, left breast CA, chronic repeat venous insufficiency. She takes Eliquis for her atrial fibrillation stroke prophylaxis. ABI in our clinic was 0.99 on the right 1/15; superficial wound on the right lateral calf in the setting of severe acute skin changes from chronic venous insufficiency and lymphedema. We used Iodoflex last week she complained of a lot of pain 1/22; this is a small but difficult wound on the right lateral calf in the setting of severe chronic venous insufficiency and secondary lymphedema. She continues to state the wounds things hurts when she is up on it but seems to be relieved by putting her leg up. I changed her to [facility] last week because the Iodoflex seem to be causing  stinging. 1/29. This wound appears to be contracting somewhat. Changes of chronic venous insufficiency with secondary lymphedema  2/12; not as good as surface today and slightly bigger. I had to change her from Iodoflex to [facility] because of the stinging pain although she does not think it was any better on the [facility]. She comes into clinic today with a area on the medial left great toe. She said she noticed blood on her sock last Saturday. She had some form of bunion surgery by Dr. Ila Mcgill her podiatrist sometime in 2019 she said he "shave the area". I could not find his operative report although I did see reference to the bunion in the area. 2/19; somewhat improved wound on the right lateral lower leg however the wound she came in on the bunion of her left great toe is actually I think larger still somewhat inflamed. We have been using Iodoflex 2/26; not much improvement in either wound area which is the original venous wound on the right lateral lower leg and in the area on the bunion of her left great toe. This still looks somewhat inflamed and tender. We have been using Iodoflex with open much improvement. 3/4; in general both her wounds look better this includes the original venous insufficiency wound on the right lateral lower leg and the area on her tip of her bunion on the left great toe medial aspect of the MTP. Change to Hydrofera Blue last week 3/12; the patient still has a small geographic shaped wound on the right lateral lower leg. We have been using Hydrofera Blue but I changed her to endoform today. She also has the area in the tip of the bunion of the left great toe. We will try Hydrofera Blue here as well 3/19; the small geographic wound on the right lateral lower leg has not filled in. There is some surrounding induration we have noticed this previously. This may be venous inflammation but I wonder about biopsying this if this is not closing up. The area over  the left medial first MTP [bunion deformity] requires debridement. This is not closing in. I am not convinced she is offloading adequately 3/26; right lateral lower leg in the setting of severe venous insufficiency and also an area over the first MTP bunion deformity. No debridement is required. We have been using endoform 4/9; right lateral lower leg wound in the setting of severe venous insufficiency and also a refractory area over the first MTP bunion deformity. The area on the right lateral lower leg is just about closed there is still a minor open area here. She comes in today with a mirror image area on the left lateral lower leg. She says this started as a scab 2 weeks ago and is gradually morphed into an open wound very similar to what she has on the right leg. She has severe bilateral venous insufficiency with venous hypertension obvious from clinical exam. 4/16; her original wound the right lateral leg wound in the setting of chronic venous hypertension is a small open area but very small. We have been using endoform. Her wound over the left first MTP bunion deformity also appears to be small and closing in. Unfortunately she has a large relative area on the left lateral calf which was new last week. Very adherent debris over this we have been using Iodoflex however that may be contributing to the debris on the wound surface 4/23; the original wound is closed and the area on the left first metatarsal bunion deformity is also almost closed the new area from last week required debridement. She went  for her reflux studies that did not take her lower extremity wraps off. This is not helpful. She did have significant reflux in the right common femoral vein. I am not going to press this issue further. She clinically has severe venous hypertension 4/29; the original wound is closed on the right lateral lower calf. The area on the left first metatarsal/bunion deformity has a small slitlike opening  that is still not closed. The real problem here is now wound on the left lateral calf which is necrotic and deep. We used Iodoflex on this wound last time. Silver alginate on the left first toe The patient has [place] wraps. We should be able to transition the right leg into compression stockings and were doing this today. 5/7; the original wound remains closed on the right lateral calf. The left first metatarsal head still is open. Deterioration in the wound which is the new wound from several weeks ago on the left lateral calf. The patient is not aware how this could have happened. We have been using Sorbact starting last week under 3 layer compression 5/14; the right lateral calf is closed the area on the first met head on the left is closed However the new area from 3 weeks ago on the left lateral calf continues to expand. There is raised nodular tissue around the wound necrotic debris on the surface. Circumference of the wound also looks necrotic. It looks as though there is involvement of the tissue under the skin around the large area of this wound which looks threatened. She is complaining of pain. Culture of this wound I did last week showed rare coag negative staph variant I have never heard of although I am doubtful this has clinical significance 05/09/20-Patient returns with a left lateral calf wound looking about the same, the biopsies reviewed show no atypical features and consistent with trauma or pressure injury or stasis change, patient has appointment to be scheduled with vascular We are using silver alginate and 3 layer compression Patient was started on Trental by dermatology which she has been taking for a week now 6/4; left lateral calf wound. I reviewed the dermatology notes with Dr. Martin Majestic from [facility] dermatology. 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 Put her on pentoxifylline  noteworthy that she is on Eliquis as well. We have been using silver alginate under compression. She has an appointment for repeat vascular studies on 7/6. This gets back to the fact that they did not take the wrap off on the original studies that were done. I do not believe she has an arterial issue. 6/10; left lateral calf wound. I put her on Hydrofera Blue last week. Some improvement in the surface condition of the wound. She has repeat vascular studies/reflux studies on 7/6. We are trying to get Apligraf through [place] [employer]. Her husband states he looked on the website this is not going to be approved. I am really not sure if this is a "class effect" 6/18; somewhat surprisingly Apligraf was approved and 100% covered. Her husband was also quite shocked by this. Nevertheless we have her repeat vascular studies on 7/6 and I will not be able to apply this until this is done. We are using Hydrofera Blue to the wound on the left. Her original wound on the right lower leg has maintain closure using compression stocking 6/25; repeat vascular studies/venous on 7/6; we will apply the Apligraf I think that week.  We are also going to work around a week's vacation they have the first week in August. 7/2; reflux studies on 7/6;. Likely place first Apligraf from the major wound area on 7/9; 7/9; patient was kindly seen by Dr. Donnetta Hutching of vein and vascular to review her circulation status with the refractory wounds in the left leg. At first wounds in this clinic were on the right leg and they heal she now has a juxta lite stocking. Dr. Donnetta Hutching thought she had venous stasis disease with venous ulcerations. He did not feel that there was anything that would suggest to expect approval with a blush ablation of her saphenous vein the saphenous veins were felt to be relatively small in caliber. She was not felt to have an arterial issue Apligraf #1 apply today in the standard fashion 7/23; the patient came in today  complaining of a lot of pain in the left lower leg where the wound is. We applied Apligraf 2 weeks ago I also increased her compression from 3-4 layer wondering about the venous hypertension. She arrives today with her original wound that we have been treating with Apligraf looking quite healthy although there is some surface slough. Problematically she has de-epithelialized and developed erythema below the wound towards the lateral malleolus itself. This is somewhat angry. I think this is venous stasis loss of epithelialization. I do not believe this is infection . Apligraf #2 applied to the original wound area 8/9-Patient returns at 2 weeks for Apligraf #3, the left leg venous leg ulcer area had more slough this time 8/23 Apligraf #4 her wound areas are small however she is still complaining of pain. She has previously been seen by vein and vascular. She has not felt to have an arterial issue she was felt to have venous stasis disease. She originally came here with a wound on her right leg on the right lateral calf this healed out. She then developed a difficult area on the left. She saw dermatology wondered whether this could be a vasculopathy. I am not sure if she is still on pentoxifylline the dermatology suggested I will have to clarify this with 9/7; patient complains of unrelenting pain making her life miserable. I been using Apligraf to close her major wound down and the major wound has come down to 2 open areas which are small and granulated. However there is erythema both inferiorly and superiorly cratered thick skin with wound issues between these areas extending into the lateral calf. There is erythema here and tenderness. I have been managing this largely as this this was severe stasis dermatitis. Vein and vascular did not feel that she would benefit from an ablation. She went to see [facility] dermatology some months ago who wondered about a vasculopathy and suggested a biopsy but I am  reluctant to go forward with that here. The patient is on Eliquis. 9/13; the patient saw Dr. Ubaldo Glassing on 9/8; she noted superficial cutaneous breakdown located on her left inferior lateral posterior tibia. She does not have a specific diagnosis for the patient's ulcer. Wanted to rule out squamous cell carcinoma and pyoderma gangrenosum she did a punch biopsy that is still bleeding. They came back for a dressing change after that procedure. They have still not heard the results of the biopsy. Was still using silver alginate as the primary dressing. She is tolerating the doxycycline although she had to take it with food Electronic Signature(s) Signed: 08/26/2020 5:12:45 PM By: Linton Ham MD Entered By: Linton Ham on 08/25/2020  09:41:59 -------------------------------------------------------------------------------- Physical Exam Details Patient Name: Date of Service: JUSTINE, [place] 08/25/2020 8:30 A M Medical Record Number: [id number] Patient Account Number: [id number] Date of Birth/Sex: Treating RN: [birth year] (86 y.o. [name] Primary Care Provider: Patrina Levering Other Clinician: Referring Provider: Treating Provider/Extender: [name] in Treatment: 22 Constitutional Patient is hypertensive.. Pulse regular and within target range for patient.Marland Kitchen Respirations regular, non-labored and within target range.. Temperature is normal and within the target range for the patient.Marland Kitchen Appears in no distress. Notes Wound exam; left lateral ankle and calf. Her original large wound continues to contract. There are 2 remanence of this one of them had surface debris which I removed with a #5 curette minimal bleeding. The punch biopsy site was inferiorly to this and this is still oozing. We used silver nitrate and a pressure dressing on this There is still erythema around the entire area but not quite as tender as last week. Hyper keratotic skin with epithelial  loss between these areas. Electronic Signature(s) Signed: 08/26/2020 5:12:45 PM By: Linton Ham MD Entered By: Linton Ham on 08/25/2020 09:43:48 -------------------------------------------------------------------------------- Physician Orders Details Patient Name: Date of Service: [name] 08/25/2020 8:30 A M Medical Record Number: [id number] Patient Account Number: [id number] Date of Birth/Sex: Treating RN: May 23, 1933 (86 y.o. [name] Primary Care Provider: Lajean Manes T Other Clinician: Referring Provider: Treating Provider/Extender: Evelena Peat in Treatment: 31 Verbal / Phone Orders: No Diagnosis Coding ICD-10 Coding Code Description I87.321 Chronic venous hypertension (idiopathic) with inflammation of right lower extremity L97.828 Non-pressure chronic ulcer of other part of left lower leg with other specified severity I87.322 Chronic venous hypertension (idiopathic) with inflammation of left lower extremity L97.522 Non-pressure chronic ulcer of other part of left foot with fat layer exposed Follow-up Appointments Return Appointment in 1 week. Dressing Change Frequency Wound #5 Left,Lateral Lower Leg Do not change entire dressing for one week. Skin Barriers/Peri-Wound Care Barrier cream Moisturizing lotion TCA Cream or Ointment - to red/inflamed areas liberally Wound Cleansing May shower with protection. - use cast protector Primary Wound Dressing Wound #5 Left,Lateral Lower Leg Calcium Alginate with Silver Secondary Dressing Wound #5 Left,Lateral Lower Leg Dry Gauze ABD pad Edema Control 3 Layer Compression System - Left Lower Extremity Avoid standing for long periods of time Elevate legs to the level of the heart or above for 30 minutes daily and/or when sitting, a frequency of: - throughout the day. Exercise regularly Support Garment 20-30 mm/Hg pressure to: - patient to apply farrow wrap 4000 to right leg.  Apply in the morning and remove at night. Off-Loading Open toe surgical shoe to: - left foot Electronic Signature(s) Signed: 08/26/2020 5:12:45 PM By: Linton Ham MD Signed: 08/27/2020 5:57:30 PM By: [name] RN, BSN Entered By: [name] on 08/25/2020 09:14:45 -------------------------------------------------------------------------------- Problem List Details Patient Name: Date of Service: [place], [place]. 08/25/2020 8:30 A M Medical Record Number: [id number] Patient Account Number: [id number] Date of Birth/Sex: Treating RN: April 03, 1933 (86 y.o. [name] Primary Care Provider: Patrina Levering Other Clinician: Referring Provider: Treating Provider/Extender: Evelena Peat in Treatment: 35 Active Problems ICD-10 Encounter Code Description Active Date MDM Diagnosis I87.321 Chronic venous hypertension (idiopathic) with inflammation of right lower 12/20/2019 No Yes extremity L97.828 Non-pressure chronic ulcer of other part of left lower leg with other specified 03/21/2020 No Yes severity I87.322 Chronic venous hypertension (idiopathic) with inflammation of left lower 02/14/2020 No Yes  extremity L97.522 Non-pressure chronic ulcer of other part of left foot with fat layer exposed 01/25/2020 No Yes Inactive Problems ICD-10 Code Description Active Date Inactive Date L97.812 Non-pressure chronic ulcer of other part of right lower leg with fat layer exposed 12/20/2019 12/20/2019 Resolved Problems Electronic Signature(s) Signed: 08/26/2020 5:12:45 PM By: Linton Ham MD Entered By: Linton Ham on 08/25/2020 09:39:50 -------------------------------------------------------------------------------- Progress Note Details Patient Name: Date of Service: [name] 08/25/2020 8:30 A M Medical Record Number: [id number] Patient Account Number: [id number] Date of Birth/Sex: Treating RN: [birth year] (86 y.o. [name] Primary Care  Provider: Patrina Levering Other Clinician: Referring Provider: Treating Provider/Extender: Evelena Peat in Treatment: 35 Subjective History of Present Illness (HPI) ADMISSION 12/20/2019 This is an 86 year old woman referred by her primary physician Dr. Felipa Eth for review of a wound on her right lateral lower leg. She was actually in this clinic on 2 separate occasions in 2010 and 2012 cared for by Dr. Sherilyn Cooter. At that point in time she had wounds on her right leg as well. She tells [place] to 1 month ago she noticed a scab building up on her right lateral lower leg this opened into a wound. There was no overt cause of this no trauma, no infection that she is aware of. She has a history of chronic venous insufficiency and wears compression stockings fairly religiously indeed she is done well over the last 8 years since she was last in this clinic. She has been applying Vaseline on this and a covering phone. This is not progressing towards healing. Past medical history; interstitial lung disease, chronic atrial fibrillation status post pacemaker, osteoarthritis of the left knee, left breast CA, chronic repeat venous insufficiency. She takes Eliquis for her atrial fibrillation stroke prophylaxis. ABI in our clinic was 0.99 on the right 1/15; superficial wound on the right lateral calf in the setting of severe acute skin changes from chronic venous insufficiency and lymphedema. We used Iodoflex last week she complained of a lot of pain 1/22; this is a small but difficult wound on the right lateral calf in the setting of severe chronic venous insufficiency and secondary lymphedema. She continues to state the wounds things hurts when she is up on it but seems to be relieved by putting her leg up. I changed her to [facility] last week because the Iodoflex seem to be causing stinging. 1/29. This wound appears to be contracting somewhat. Changes of chronic venous insufficiency  with secondary lymphedema 2/12; not as good as surface today and slightly bigger. I had to change her from Iodoflex to [facility] because of the stinging pain although she does not think it was any better on the [facility]. ooShe comes into clinic today with a area on the medial left great toe. She said she noticed blood on her sock last Saturday. She had some form of bunion surgery by Dr. Ila Mcgill her podiatrist sometime in 2019 she said he "shave the area". I could not find his operative report although I did see reference to the bunion in the area. 2/19; somewhat improved wound on the right lateral lower leg however the wound she came in on the bunion of her left great toe is actually I think larger still somewhat inflamed. We have been using Iodoflex 2/26; not much improvement in either wound area which is the original venous wound on the right lateral lower leg and in the area on the bunion of her left great  toe. This still looks somewhat inflamed and tender. We have been using Iodoflex with open much improvement. 3/4; in general both her wounds look better this includes the original venous insufficiency wound on the right lateral lower leg and the area on her tip of her bunion on the left great toe medial aspect of the MTP. Change to Hydrofera Blue last week 3/12; the patient still has a small geographic shaped wound on the right lateral lower leg. We have been using Hydrofera Blue but I changed her to endoform today. She also has the area in the tip of the bunion of the left great toe. We will try Hydrofera Blue here as well 3/19; the small geographic wound on the right lateral lower leg has not filled in. There is some surrounding induration we have noticed this previously. This may be venous inflammation but I wonder about biopsying this if this is not closing up. The area over the left medial first MTP [bunion deformity] requires debridement. This is not closing in. I am not  convinced she is offloading adequately 3/26; right lateral lower leg in the setting of severe venous insufficiency and also an area over the first MTP bunion deformity. No debridement is required. We have been using endoform 4/9; right lateral lower leg wound in the setting of severe venous insufficiency and also a refractory area over the first MTP bunion deformity. The area on the right lateral lower leg is just about closed there is still a minor open area here. She comes in today with a mirror image area on the left lateral lower leg. She says this started as a scab 2 weeks ago and is gradually morphed into an open wound very similar to what she has on the right leg. She has severe bilateral venous insufficiency with venous hypertension obvious from clinical exam. 4/16; her original wound the right lateral leg wound in the setting of chronic venous hypertension is a small open area but very small. We have been using endoform. Her wound over the left first MTP bunion deformity also appears to be small and closing in. Unfortunately she has a large relative area on the left lateral calf which was new last week. Very adherent debris over this we have been using Iodoflex however that may be contributing to the debris on the wound surface 4/23; the original wound is closed and the area on the left first metatarsal bunion deformity is also almost closed the new area from last week required debridement. She went for her reflux studies that did not take her lower extremity wraps off. This is not helpful. She did have significant reflux in the right common femoral vein. I am not going to press this issue further. She clinically has severe venous hypertension 4/29; the original wound is closed on the right lateral lower calf. The area on the left first metatarsal/bunion deformity has a small slitlike opening that is still not closed. The real problem here is now wound on the left lateral calf which is  necrotic and deep. We used Iodoflex on this wound last time. Silver alginate on the left first toe The patient has [place] wraps. We should be able to transition the right leg into compression stockings and were doing this today. 5/7; the original wound remains closed on the right lateral calf. The left first metatarsal head still is open. Deterioration in the wound which is the new wound from several weeks ago on the left lateral calf. The patient is not aware  how this could have happened. We have been using Sorbact starting last week under 3 layer compression 5/14; the right lateral calf is closed the area on the first met head on the left is closed However the new area from 3 weeks ago on the left lateral calf continues to expand. There is raised nodular tissue around the wound necrotic debris on the surface. Circumference of the wound also looks necrotic. It looks as though there is involvement of the tissue under the skin around the large area of this wound which looks threatened. She is complaining of pain. Culture of this wound I did last week showed rare coag negative staph variant I have never heard of although I am doubtful this has clinical significance 05/09/20-Patient returns with a left lateral calf wound looking about the same, the biopsies reviewed show no atypical features and consistent with trauma or pressure injury or stasis change, patient has appointment to be scheduled with vascular We are using silver alginate and 3 layer compression Patient was started on Trental by dermatology which she has been taking for a week now 6/4; left lateral calf wound. I reviewed the dermatology notes with Dr. Martin Majestic from [facility] dermatology. 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 Put her on pentoxifylline noteworthy that she is on Eliquis as well. We have been using silver alginate under  compression. She has an appointment for repeat vascular studies on 7/6. This gets back to the fact that they did not take the wrap off on the original studies that were done. I do not believe she has an arterial issue. 6/10; left lateral calf wound. I put her on Hydrofera Blue last week. Some improvement in the surface condition of the wound. She has repeat vascular studies/reflux studies on 7/6. We are trying to get Apligraf through [place] [employer]. Her husband states he looked on the website this is not going to be approved. I am really not sure if this is a "class effect" 6/18; somewhat surprisingly Apligraf was approved and 100% covered. Her husband was also quite shocked by this. Nevertheless we have her repeat vascular studies on 7/6 and I will not be able to apply this until this is done. We are using Hydrofera Blue to the wound on the left. Her original wound on the right lower leg has maintain closure using compression stocking 6/25; repeat vascular studies/venous on 7/6; we will apply the Apligraf I think that week. We are also going to work around a week's vacation they have the first week in August. 7/2; reflux studies on 7/6;. Likely place first Apligraf from the major wound area on 7/9; 7/9; patient was kindly seen by Dr. Donnetta Hutching of vein and vascular to review her circulation status with the refractory wounds in the left leg. At first wounds in this clinic were on the right leg and they heal she now has a juxta lite stocking. Dr. Donnetta Hutching thought she had venous stasis disease with venous ulcerations. He did not feel that there was anything that would suggest to expect approval with a blush ablation of her saphenous vein the saphenous veins were felt to be relatively small in caliber. She was not felt to have an arterial issue Apligraf #1 apply today in the standard fashion 7/23; the patient came in today complaining of a lot of pain in the left lower leg where the wound is. We applied  Apligraf 2 weeks ago I also  increased her compression from 3-4 layer wondering about the venous hypertension. She arrives today with her original wound that we have been treating with Apligraf looking quite healthy although there is some surface slough. Problematically she has de-epithelialized and developed erythema below the wound towards the lateral malleolus itself. This is somewhat angry. I think this is venous stasis loss of epithelialization. I do not believe this is infection . Apligraf #2 applied to the original wound area 8/9-Patient returns at 2 weeks for Apligraf #3, the left leg venous leg ulcer area had more slough this time 8/23 Apligraf #4 her wound areas are small however she is still complaining of pain. She has previously been seen by vein and vascular. She has not felt to have an arterial issue she was felt to have venous stasis disease. She originally came here with a wound on her right leg on the right lateral calf this healed out. She then developed a difficult area on the left. She saw dermatology wondered whether this could be a vasculopathy. I am not sure if she is still on pentoxifylline the dermatology suggested I will have to clarify this with 9/7; patient complains of unrelenting pain making her life miserable. I been using Apligraf to close her major wound down and the major wound has come down to 2 open areas which are small and granulated. However there is erythema both inferiorly and superiorly cratered thick skin with wound issues between these areas extending into the lateral calf. There is erythema here and tenderness. I have been managing this largely as this this was severe stasis dermatitis. Vein and vascular did not feel that she would benefit from an ablation. She went to see [facility] dermatology some months ago who wondered about a vasculopathy and suggested a biopsy but I am reluctant to go forward with that here. The patient is on Eliquis. 9/13; the  patient saw Dr. Ubaldo Glassing on 9/8; she noted superficial cutaneous breakdown located on her left inferior lateral posterior tibia. She does not have a specific diagnosis for the patient's ulcer. Wanted to rule out squamous cell carcinoma and pyoderma gangrenosum she did a punch biopsy that is still bleeding. They came back for a dressing change after that procedure. They have still not heard the results of the biopsy. Was still using silver alginate as the primary dressing. She is tolerating the doxycycline although she had to take it with food Objective Constitutional Patient is hypertensive.. Pulse regular and within target range for patient.Marland Kitchen Respirations regular, non-labored and within target range.. Temperature is normal and within the target range for the patient.Marland Kitchen Appears in no distress. Vitals Time Taken: 8:35 AM, Height: 69 in, Weight: 163 lbs, BMI: 24.1, Temperature: 97.6 F, Pulse: 79 bpm, Respiratory Rate: 18 breaths/min, Blood Pressure: 162/90 mmHg. General Notes: Wound exam; left lateral ankle and calf. Her original large wound continues to contract. There are 2 remanence of this one of them had surface debris which I removed with a #5 curette minimal bleeding. The punch biopsy site was inferiorly to this and this is still oozing. We used silver nitrate and a pressure dressing on this ooThere is still erythema around the entire area but not quite as tender as last week. Hyper keratotic skin with epithelial loss between these areas. Integumentary (Hair, Skin) Wound #5 status is Open. Original cause of wound was Gradually Appeared. The wound is located on the Left,Lateral Lower Leg. The wound measures 9.5cm length x 2.5cm width x 0.2cm depth; 18.653cm^2 area and 3.731cm^3  volume. There is Fat Layer (Subcutaneous Tissue) exposed. There is no tunneling or undermining noted. There is a small amount of serosanguineous drainage noted. The wound margin is distinct with the outline attached to the  wound base. There is medium (34-66%) red, pink granulation within the wound bed. There is a medium (34-66%) amount of necrotic tissue within the wound bed including Adherent Slough. Assessment Active Problems ICD-10 Chronic venous hypertension (idiopathic) with inflammation of right lower extremity Non-pressure chronic ulcer of other part of left lower leg with other specified severity Chronic venous hypertension (idiopathic) with inflammation of left lower extremity Non-pressure chronic ulcer of other part of left foot with fat layer exposed Procedures Wound #5 Pre-procedure diagnosis of Wound #5 is a Venous Leg Ulcer located on the Left,Lateral Lower Leg .Severity of Tissue Pre Debridement is: Fat layer exposed. There was a Excisional Skin/Subcutaneous Tissue Debridement with a total area of 8 sq cm performed by Ricard Dillon., MD. With the following instrument(s): Curette to remove Viable and Non-Viable tissue/material. Material removed includes Subcutaneous Tissue and Slough and. No specimens were taken. A time out was conducted at 09:12, prior to the start of the procedure. A Minimum amount of bleeding was controlled with Pressure. The procedure was tolerated well with a pain level of 0 throughout and a pain level of 0 following the procedure. Post Debridement Measurements: 9.5cm length x 2.5cm width x 0.2cm depth; 3.731cm^3 volume. Character of Wound/Ulcer Post Debridement is improved. Severity of Tissue Post Debridement is: Fat layer exposed. Post procedure Diagnosis Wound #5: Same as Pre-Procedure Pre-procedure diagnosis of Wound #5 is a Venous Leg Ulcer located on the Left,Lateral Lower Leg . There was a Three Layer Compression Therapy Procedure by [name], RN. Post procedure Diagnosis Wound #5: Same as Pre-Procedure Plan Follow-up Appointments: Return Appointment in 1 week. Dressing Change Frequency: Wound #5 Left,Lateral Lower Leg: Do not change entire dressing for  one week. Skin Barriers/Peri-Wound Care: Barrier cream Moisturizing lotion TCA Cream or Ointment - to red/inflamed areas liberally Wound Cleansing: May shower with protection. - use cast protector Primary Wound Dressing: Wound #5 Left,Lateral Lower Leg: Calcium Alginate with Silver Secondary Dressing: Wound #5 Left,Lateral Lower Leg: Dry Gauze ABD pad Edema Control: 3 Layer Compression System - Left Lower Extremity Avoid standing for long periods of time Elevate legs to the level of the heart or above for 30 minutes daily and/or when sitting, a frequency of: - throughout the day. Exercise regularly Support Garment 20-30 mm/Hg pressure to: - patient to apply farrow wrap 4000 to right leg. Apply in the morning and remove at night. Off-Loading: Open toe surgical shoe to: - left foot 1. I continued with TCA 2. Continue with silver alginate/ABDs under 3 layer compression the left leg 3. I had previously considered pyoderma and this was one of the reasons I use doxycycline last week. I am glad that Dr. Ubaldo Glassing chose to do the biopsy. Admittedly I was not really can considering squamous cell carcinoma, I have never seen it look like this. 4. I think the patient has chronic stasis dermatitis. She has had wounds on the other leg I think a lot of the skin changes here are secondary to this and chronic secondary lymphedema. 5. I would like her to continue with doxycycline I have given her enough for 2 weeks she has another week to go. 6. If the biopsy is positive then a lot of the care will be turned back to Dr. Ubaldo Glassing Electronic Signature(s) Signed: 08/26/2020  5:12:45 PM By: Linton Ham MD Entered By: Linton Ham on 08/25/2020 09:45:26 -------------------------------------------------------------------------------- SuperBill Details Patient Name: Date of Service: [name] 08/25/2020 Medical Record Number: [id number] Patient Account Number: [id number] Date of Birth/Sex: Treating  RN: [birth year] (86 y.o. [name] Primary Care Provider: Lajean Manes T Other Clinician: Referring Provider: Treating Provider/Extender: Evelena Peat in Treatment: 35 Diagnosis Coding ICD-10 Codes Code Description [phone] Chronic venous hypertension (idiopathic) with inflammation of right lower extremity L97.828 Non-pressure chronic ulcer of other part of left lower leg with other specified severity I87.322 Chronic venous hypertension (idiopathic) with inflammation of left lower extremity L97.522 Non-pressure chronic ulcer of other part of left foot with fat layer exposed Facility Procedures The patient participates with Medicare or their insurance follows the Medicare Facility Guidelines: CPT4 Code Description Modifier Quantity [id number] 11042 - DEB SUBQ TISSUE 20 SQ CM/< 1 ICD-10 Diagnosis Description L97.828 Non-pressure chronic ulcer of  other part of left lower leg with other specified severity I87.321 Chronic venous hypertension (idiopathic) with inflammation of right lower extremity Physician Procedures : CPT4 Code Description Modifier [id number] - WC PHYS SUBQ TISS 20 SQ CM ICD-10 Diagnosis Description L97.828 Non-pressure chronic ulcer of other part of left lower leg with other specified severity I87.321 Chronic venous hypertension (idiopathic)  with inflammation of right lower extremity Quantity: 1 Electronic Signature(s) Signed: 08/26/2020 5:12:45 PM By: Linton Ham MD Entered By: Linton Ham on 08/25/2020 09:46:00

## 2020-08-27 NOTE — Progress Notes (Signed)
[name], [name] ([id number]) Visit Report for 08/26/2020 SuperBill Details Patient Name: Date of Service: [name], [name] 08/26/2020 Medical Record Number: [id number] Patient Account Number: [id number] Date of Birth/Sex: Treating RN: 10/26/1933 (86 y.o. [name] Primary Care Provider: Lajean Manes T Other Clinician: Referring Provider: Treating Provider/Extender: Evelena Peat in Treatment: 35 Diagnosis Coding ICD-10 Codes Code Description [id number] Chronic venous hypertension (idiopathic) with inflammation of right lower extremity L97.828 Non-pressure chronic ulcer of other part of left lower leg with other specified severity I87.322 Chronic venous hypertension (idiopathic) with inflammation of left lower extremity L97.522 Non-pressure chronic ulcer of other part of left foot with fat layer exposed Facility Procedures The patient participates with Medicare or their insurance follows the Medicare Facility Guidelines CPT4 Code Description Modifier Quantity [id number] (Facility Use Only) [phone] - [place] 1 Electronic Signature(s) Signed: 08/26/2020 5:12:45 PM By: Linton Ham MD Signed: 08/27/2020 5:57:30 PM By: [name] RN, BSN Entered By: [name] on 08/26/2020 14:48:39

## 2020-08-28 ENCOUNTER — Other Ambulatory Visit: Payer: Self-pay

## 2020-08-28 ENCOUNTER — Ambulatory Visit (INDEPENDENT_AMBULATORY_CARE_PROVIDER_SITE_OTHER): Payer: Medicare Other

## 2020-08-28 VITALS — HR 79

## 2020-08-28 DIAGNOSIS — I482 Chronic atrial fibrillation, unspecified: Secondary | ICD-10-CM | POA: Diagnosis not present

## 2020-08-28 DIAGNOSIS — Z950 Presence of cardiac pacemaker: Secondary | ICD-10-CM

## 2020-08-28 DIAGNOSIS — I5032 Chronic diastolic (congestive) heart failure: Secondary | ICD-10-CM

## 2020-08-28 NOTE — Progress Notes (Signed)
Remote pacemaker transmission.   

## 2020-08-28 NOTE — Patient Instructions (Signed)
Follow up as previously scheduled

## 2020-08-28 NOTE — Progress Notes (Signed)
[name], [name] ([id number]) Visit Report for 08/25/2020 Arrival Information Details Patient Name: Date of Service: [place], [name] 08/25/2020 8:30 A M Medical Record Number: [id number] Patient Account Number: [id number] Date of Birth/Sex: Treating RN: November 06, 1933 (86 yTracy Greeno. [name] Primary Care Seichi Kaufhold: Lajean Manes T Other Clinician: Referring Jolin Benavides: Treating Christeena Krogh/Extender: Evelena Peat in Treatment: 48 Visit Information History Since Last Visit Added or deleted any medications: No Patient Arrived: Ambulatory Any new allergies or adverse reactions: No Arrival Time: 08:42 Had a fall or experienced change in No Accompanied By: spouse activities of daily living that may affect Transfer Assistance: None risk of falls: Patient Identification Verified: Yes Signs or symptoms of abuse/neglect since last visito No Secondary Verification Process Completed: Yes Hospitalized since last visit: No Patient Requires Transmission-Based Precautions: No Implantable device outside of the clinic excluding No Patient Has Alerts: Yes cellular tissue based products placed in the center Patient Alerts: Patient on Blood Thinner since last visit: Right ABI:0[name]99 Has Dressing in Place as Prescribed: Yes Has Compression in Place as Prescribed: Yes Pain Present Now: No Electronic Signature(s) Signed: 08/25/2020 6:04:50 PM By: Kela Millin Entered By: Kela Millin on 08/25/2020 08:42:56 -------------------------------------------------------------------------------- Compression Therapy Details Patient Name: Date of Service: Tracy Banker J. 08/25/2020 8:30 A M Medical Record Number: [id number] Patient Account Number: [id number] Date of Birth/Sex: Treating RN: [birth year] (87 yTracy Greeno. [name] Primary Care Sangita Zani: Patrina Levering Other Clinician: Referring Alyn Jurney: Treating Klaira Pesci/Extender: [name] in  Treatment: 35 Compression Therapy Performed for Wound Assessment: Wound #5 Left,Lateral Lower Leg Performed By: Clinician [name], RN Compression Type: Three Layer Post Procedure Diagnosis Same as Pre-procedure Electronic Signature(s) Signed: 08/27/2020 5:57:30 PM By: [name] RN, BSN Entered By: [name] on 08/25/2020 01:60:10 -------------------------------------------------------------------------------- Encounter Discharge Information Details Patient Name: Date of Service: [name], . 08/25/2020 8:30 A M Medical Record Number: [id number] Patient Account Number: [id number] Date of Birth/Sex: Treating RN: [birth year] (49 yTracy Greeno. [name] Primary Care Kaspar Albornoz: Patrina Levering Other Clinician: Referring Lisia Westbay: Treating Arleatha Philipps/Extender: Evelena Peat in Treatment: 77 Encounter Discharge Information Items Post Procedure Vitals Discharge Condition: Stable Temperature (F): 97[name]6 Ambulatory Status: Ambulatory Pulse (bpm): 79 Discharge Destination: Home Respiratory Rate (breaths/min): 18 Transportation: Private Auto Blood Pressure (mmHg): 162/90 Accompanied By: husband Schedule Follow-up Appointment: Yes Clinical Summary of Care: Electronic Signature(s) Signed: 08/25/2020 5:15:56 PM By: Deon Pilling Entered By: Deon Pilling on 08/25/2020 09:50:23 -------------------------------------------------------------------------------- Lower Extremity Assessment Details Patient Name: Date of Service: [place], [name] 08/25/2020 8:30 A M Medical Record Number: [id number] Patient Account Number: [id number] Date of Birth/Sex: Treating RN: November 01, 1933 (89 yTracy Greeno. [name] Primary Care Alisabeth Selkirk: Patrina Levering Other Clinician: Referring Lorielle Boehning: Treating Fidencio Duddy/Extender: [name] in Treatment: 35 Edema Assessment Assessed: [Left: No] [Right: No] Edema: [Left: N] [Right: o] Calf Left:  Right: Point of Measurement: 41 cm From Medial Instep 29 cm cm Ankle Left: Right: Point of Measurement: 14 cm From Medial Instep 23 cm cm Vascular Assessment Pulses: Dorsalis Pedis Palpable: [Left:Yes] Electronic Signature(s) Signed: 08/25/2020 6:04:50 PM By: Kela Millin Entered By: Kela Millin on 08/25/2020 08:43:42 -------------------------------------------------------------------------------- Multi Wound Chart Details Patient Name: Date of Service: Tracy Banker J. 08/25/2020 8:30 A M Medical Record Number: [id number] Patient Account Number: [id number] Date of Birth/Sex: Treating RN: 27-Sep-1933 (49 yTracy Greeno. [name] Primary Care Rayder Sullenger: Patrina Levering Other Clinician: Referring Amayrani Bennick: Treating Icesis Renn/Extender: Linton Ham  Stone[place], Hal T Weeks in Treatment: 35 Vital Signs Height(in): 69 Pulse(bpm): 79 Weight(lbs): 163 Blood Pressure(mmHg): 162/90 Body Mass Index(BMI): 24 Temperature(F): 97[name]6 Respiratory Rate(breaths/min): 18 Photos: [5:No Photos Left, Lateral Lower Leg] [N/A:N/A N/A] Wound Location: [5:Gradually Appeared] [N/A:N/A] Wounding Event: [5:Venous Leg Ulcer] [N/A:N/A] Primary Etiology: [5:Cataracts, Anemia, Arrhythmia,] [N/A:N/A] Comorbid History: [5:Congestive Heart Failure, Hypertension, Peripheral Venous Disease, Received Radiation 03/21/2020] [N/A:N/A] Date Acquired: [5:22] [N/A:N/A] Weeks of Treatment: [5:Open] [N/A:N/A] Wound Status: [5:9[name]5x2[name]5x0[name]2] [N/A:N/A] Measurements L x W x D (cm) [5:18[name]653] [N/A:N/A] A (cm) : rea [5:3[name]731] [N/A:N/A] Volume (cm) : [5:-3608[name]30%] [N/A:N/A] % Reduction in A [5:rea: -7362[name]00%] [N/A:N/A] % Reduction in Volume: [5:Full Thickness Without Exposed] [N/A:N/A] Classification: [5:Support Structures Small] [N/A:N/A] Exudate A mount: [5:Serosanguineous] [N/A:N/A] Exudate Type: [5:red, brown] [N/A:N/A] Exudate Color: [5:Distinct, outline attached] [N/A:N/A] Wound Margin: [5:Medium (34-66%)]  [N/A:N/A] Granulation A mount: [5:Red, Pink] [N/A:N/A] Granulation Quality: [5:Medium (34-66%)] [N/A:N/A] Necrotic A mount: [5:Fat Layer (Subcutaneous Tissue): Yes N/A] Exposed Structures: [5:Fascia: No Tendon: No Muscle: No Joint: No Bone: No Small (1-33%)] [N/A:N/A] Epithelialization: [5:Debridement - Excisional] [N/A:N/A] Debridement: Pre-procedure Verification/Time Out 09:12 [N/A:N/A] Taken: [5:Subcutaneous, Slough] [N/A:N/A] Tissue Debrided: [5:Skin/Subcutaneous Tissue] [N/A:N/A] Level: [5:8] [N/A:N/A] Debridement A (sq cm): [5:rea Curette] [N/A:N/A] Instrument: [5:Minimum] [N/A:N/A] Bleeding: [5:Pressure] [N/A:N/A] Hemostasis A chieved: [5:0] [N/A:N/A] Procedural Pain: [5:0] [N/A:N/A] Post Procedural Pain: [5:Procedure was tolerated well] [N/A:N/A] Debridement Treatment Response: [5:9[name]5x2[name]5x0[name]2] [N/A:N/A] Post Debridement Measurements L x W x D (cm) [5:3[name]731] [N/A:N/A] Post Debridement Volume: (cm) [5:Compression Therapy] [N/A:N/A] Procedures Performed: [5:Debridement] Treatment Notes Electronic Signature(s) Signed: 08/26/2020 5:12:45 PM By: Linton Ham MD Signed: 08/27/2020 5:57:30 PM By: [name] RN, BSN Signed: 08/27/2020 5:57:30 PM By: [name] RN, BSN Entered By: Linton Ham on 08/25/2020 09:39:56 -------------------------------------------------------------------------------- Multi-Disciplinary Care Plan Details Patient Name: Date of Service: [place], Tracy J. 08/25/2020 8:30 A M Medical Record Number: [id number] Patient Account Number: [id number] Date of Birth/Sex: Treating RN: [birth year] (89 yTracy Greeno. [name] Primary Care Lezlie Ritchey: Patrina Levering Other Clinician: Referring Karter Haire: Treating Vibhav Waddill/Extender: [name] in Treatment: 35 Active Inactive Wound/Skin Impairment Nursing Diagnoses: Knowledge deficit related to ulceration/compromised skin integrity Goals: Patient/caregiver will verbalize  understanding of skin care regimen Date Initiated: 12/20/2019 Target Resolution Date: 09/26/2020 Goal Status: Active Interventions: Assess patient/caregiver ability to perform ulcer/skin care regimen upon admission and as needed Provide education on ulcer and skin care Treatment Activities: Skin care regimen initiated : 12/20/2019 Topical wound management initiated : 12/20/2019 Notes: Electronic Signature(s) Signed: 08/27/2020 5:57:30 PM By: [name] RN, BSN Entered By: [name] on 08/25/2020 09:09:29 -------------------------------------------------------------------------------- Pain Assessment Details Patient Name: Date of Service: Tracy Banker J. 08/25/2020 8:30 A M Medical Record Number: [id number] Patient Account Number: [id number] Date of Birth/Sex: Treating RN: 1933/08/18 (64 yTracy Greeno. [name] Primary Care Arneta Mahmood: Patrina Levering Other Clinician: Referring Clair Alfieri: Treating Tuana Hoheisel/Extender: [name] in Treatment: 35 Active Problems Location of Pain Severity and Description of Pain Patient Has Paino No Site Locations Pain Management and Medication Current Pain Management: Electronic Signature(s) Signed: 08/25/2020 6:04:50 PM By: Kela Millin Entered By: Kela Millin on 08/25/2020 08:43:27 -------------------------------------------------------------------------------- Patient/Caregiver Education Details Patient Name: Date of Service: [place], [name] 9/13/2021andnbsp8:30 A M Medical Record Number: [id number] Patient Account Number: [id number] Date of Birth/Gender: Treating RN: 07/26/1933 (72 yTracy Greeno. [name] Primary Care Physician: Patrina Levering Other Clinician: Referring Physician: Treating Physician/Extender: Evelena Peat in Treatment: 26 Education Assessment Education Provided To: Patient Education Topics Provided Wound/Skin  Impairment: Methods:  Explain/Verbal Responses: State content correctly Electronic Signature(s) Signed: 08/27/2020 5:57:30 PM By: [name] RN, BSN Entered By: [name] on 08/25/2020 09:18:35 -------------------------------------------------------------------------------- Wound Assessment Details Patient Name: Date of Service: Tracy Sermon. 08/25/2020 8:30 A M Medical Record Number: [id number] Patient Account Number: [id number] Date of Birth/Sex: Treating RN: [birth year] (89 yTracy Greeno. [name] Primary Care Jacon Whetzel: Lajean Manes T Other Clinician: Referring Lamount Bankson: Treating Ewa Hipp/Extender: [name] in Treatment: 35 Wound Status Wound Number: 5 Primary Venous Leg Ulcer Etiology: Wound Location: Left, Lateral Lower Leg Wound Open Wounding Event: Gradually Appeared Status: Date Acquired: 03/21/2020 Comorbid Cataracts, Anemia, Arrhythmia, Congestive Heart Failure, Weeks Of Treatment: 22 History: Hypertension, Peripheral Venous Disease, Received Radiation Clustered Wound: No Photos Wound Measurements Length: (cm) 9[name]5 Width: (cm) 2[name]5 Depth: (cm) 0[name]2 Area: (cm) 18[name]653 Volume: (cm) 3[name]731 % Reduction in Area: -3608[name]3% % Reduction in Volume: -7362% Epithelialization: Small (1-33%) Tunneling: No Undermining: No Wound Description Classification: Full Thickness Without Exposed Support Struct Wound Margin: Distinct, outline attached Exudate Amount: Small Exudate Type: Serosanguineous Exudate Color: red, brown ures Foul Odor After Cleansing: No Slough/Fibrino Yes Wound Bed Granulation Amount: Medium (34-66%) Exposed Structure Granulation Quality: Red, Pink Fascia Exposed: No Necrotic Amount: Medium (34-66%) Fat Layer (Subcutaneous Tissue) Exposed: Yes Necrotic Quality: Adherent Slough Tendon Exposed: No Muscle Exposed: No Joint Exposed: No Bone Exposed: No Treatment Notes Wound #5 (Left, Lateral Lower Leg) 1. Cleanse With Wound  Cleanser Soap and water 2. Periwound Care Moisturizing lotion TCA Cream 3. Primary Dressing Applied Calcium Alginate Ag 4. Secondary Dressing ABD Pad Dry Gauze 6. Support Layer Applied 3 layer compression wrap Notes netting Electronic Signature(s) Signed: 08/27/2020 2:46:31 PM By: Mikeal Hawthorne EMT/HBOT/SD Signed: 08/28/2020 4:34:39 PM By: Kela Millin Previous Signature: 08/25/2020 6:04:50 PM Version By: Kela Millin Entered By: Mikeal Hawthorne on 08/27/2020 11:35:17 -------------------------------------------------------------------------------- Vitals Details Patient Name: Date of Service: [name], Tracy J. 08/25/2020 8:30 A M Medical Record Number: [id number] Patient Account Number: [id number] Date of Birth/Sex: Treating RN: 06/22/1933 (87 yTracy Greeno. [name] Primary Care Laloni Rowton: Lajean Manes T Other Clinician: Referring Hanifah Royse: Treating Shaheem Pichon/Extender: Evelena Peat in Treatment: 35 Vital Signs Time Taken: 08:35 Temperature (F): 97[name]6 Height (in): 69 Pulse (bpm): 79 Weight (lbs): 163 Respiratory Rate (breaths/min): 18 Body Mass Index (BMI): 24[name]1 Blood Pressure (mmHg): 162/90 Reference Range: 80 - 120 mg / dl Electronic Signature(s) Signed: 08/25/2020 6:04:50 PM By: Kela Millin Entered By: Kela Millin on 08/25/2020 08:43:21

## 2020-08-28 NOTE — Progress Notes (Signed)
1.) Reason for visit: new start propafenone 225 mg bid  2.) Name of MD requesting visit: Dr. Lovena Le  3.) H&P: Pt with symptomatic PVC's  4.) ROS related to problem: Pt started on propafenone 225 mg bid for pvc's.  Per Pt she has been tolerating medication well.  Feels sick to her stomach that she attributes to recent antibiotic prescription.  Taking doxycycline 100 mg BID PO for chronic leg wound.  5.) Assessment and plan per MD: Per Dr. Lyda Jester doxycycline.  Continue propafenone.  Follow up as scheduled.

## 2020-09-01 ENCOUNTER — Encounter (HOSPITAL_BASED_OUTPATIENT_CLINIC_OR_DEPARTMENT_OTHER): Payer: Medicare Other | Admitting: Internal Medicine

## 2020-09-01 DIAGNOSIS — I87321 Chronic venous hypertension (idiopathic) with inflammation of right lower extremity: Secondary | ICD-10-CM | POA: Diagnosis not present

## 2020-09-02 ENCOUNTER — Telehealth: Payer: Self-pay | Admitting: Internal Medicine

## 2020-09-02 NOTE — Progress Notes (Signed)
[name], [name] ([id number]) Visit Report for 09/01/2020 HPI Details Patient Name: Date of Service: [place], [name] 09/01/2020 8:30 A M Medical Record Number: [id number] Patient Account Number: [id number] Date of Birth/Sex: Treating RN: 11-20-1933 (86 y.o. [name] Primary Care Provider: Patrina Levering Other Clinician: Referring Provider: Treating Provider/Extender: Evelena Peat in Treatment: 36 History of Present Illness HPI Description: ADMISSION 12/20/2019 This is an 86 year old woman referred by her primary physician Dr. Felipa Eth for review of a wound on her right lateral lower leg. She was actually in this clinic on 2 separate occasions in 2010 and 2012 cared for by Dr. Sherilyn Cooter. At that point in time she had wounds on her right leg as well. She tells [place] to 1 month ago she noticed a scab building up on her right lateral lower leg this opened into a wound. There was no overt cause of this no trauma, no infection that she is aware of. She has a history of chronic venous insufficiency and wears compression stockings fairly religiously indeed she is done well over the last 8 years since she was last in this clinic. She has been applying Vaseline on this and a covering phone. This is not progressing towards healing. Past medical history; interstitial lung disease, chronic atrial fibrillation status post pacemaker, osteoarthritis of the left knee, left breast CA, chronic repeat venous insufficiency. She takes Eliquis for her atrial fibrillation stroke prophylaxis. ABI in our clinic was 0.99 on the right 1/15; superficial wound on the right lateral calf in the setting of severe acute skin changes from chronic venous insufficiency and lymphedema. We used Iodoflex last week she complained of a lot of pain 1/22; this is a small but difficult wound on the right lateral calf in the setting of severe chronic venous insufficiency and secondary lymphedema.  She continues to state the wounds things hurts when she is up on it but seems to be relieved by putting her leg up. I changed her to [facility] last week because the Iodoflex seem to be causing stinging. 1/29. This wound appears to be contracting somewhat. Changes of chronic venous insufficiency with secondary lymphedema 2/12; not as good as surface today and slightly bigger. I had to change her from Iodoflex to [facility] because of the stinging pain although she does not think it was any better on the [facility] She comes into clinic today with a area on the medial left great toe. She said she noticed blood on her sock last Saturday. She had some form of bunion surgery by Dr. Ila Mcgill her podiatrist sometime in 2019 she said he "shave the area". I could not find his operative report although I did see reference to the bunion in the area. 2/19; somewhat improved wound on the right lateral lower leg however the wound she came in on the bunion of her left great toe is actually I think larger still somewhat inflamed. We have been using Iodoflex 2/26; not much improvement in either wound area which is the original venous wound on the right lateral lower leg and in the area on the bunion of her left great toe. This still looks somewhat inflamed and tender. We have been using Iodoflex with open much improvement. 3/4; in general both her wounds look better this includes the original venous insufficiency wound on the right lateral lower leg and the area on her tip of her bunion on the left great toe medial aspect of the MTP. Change  to [facility] last week 3/12; the patient still has a small geographic shaped wound on the right lateral lower leg. We have been using Hydrofera Blue but I changed her to endoform today. She also has the area in the tip of the bunion of the left great toe. We will try Hydrofera Blue here as well 3/19; the small geographic wound on the right lateral lower leg  has not filled in. There is some surrounding induration we have noticed this previously. This may be venous inflammation but I wonder about biopsying this if this is not closing up. The area over the left medial first MTP [bunion deformity] requires debridement. This is not closing in. I am not convinced she is offloading adequately 3/26; right lateral lower leg in the setting of severe venous insufficiency and also an area over the first MTP bunion deformity. No debridement is required. We have been using endoform 4/9; right lateral lower leg wound in the setting of severe venous insufficiency and also a refractory area over the first MTP bunion deformity. The area on the right lateral lower leg is just about closed there is still a minor open area here. She comes in today with a mirror image area on the left lateral lower leg. She says this started as a scab 2 weeks ago and is gradually morphed into an open wound very similar to what she has on the right leg. She has severe bilateral venous insufficiency with venous hypertension obvious from clinical exam. 4/16; her original wound the right lateral leg wound in the setting of chronic venous hypertension is a small open area but very small. We have been using endoform. Her wound over the left first MTP bunion deformity also appears to be small and closing in. Unfortunately she has a large relative area on the left lateral calf which was new last week. Very adherent debris over this we have been using Iodoflex however that may be contributing to the debris on the wound surface 4/23; the original wound is closed and the area on the left first metatarsal bunion deformity is also almost closed the new area from last week required debridement. She went for her reflux studies that did not take her lower extremity wraps off. This is not helpful. She did have significant reflux in the right common femoral vein. I am not going to press this issue further.  She clinically has severe venous hypertension 4/29; the original wound is closed on the right lateral lower calf. The area on the left first metatarsal/bunion deformity has a small slitlike opening that is still not closed. The real problem here is now wound on the left lateral calf which is necrotic and deep. We used Iodoflex on this wound last time. Silver alginate on the left first toe The patient has [place] wraps. We should be able to transition the right leg into compression stockings and were doing this today. 5/7; the original wound remains closed on the right lateral calf. The left first metatarsal head still is open. Deterioration in the wound which is the new wound from several weeks ago on the left lateral calf. The patient is not aware how this could have happened. We have been using Sorbact starting last week under 3 layer compression 5/14; the right lateral calf is closed the area on the first met head on the left is closed However the new area from 3 weeks ago on the left lateral calf continues to expand. There is raised nodular tissue around  the wound necrotic debris on the surface. Circumference of the wound also looks necrotic. It looks as though there is involvement of the tissue under the skin around the large area of this wound which looks threatened. She is complaining of pain. Culture of this wound I did last week showed rare coag negative staph variant I have never heard of although I am doubtful this has clinical significance 05/09/20-Patient returns with a left lateral calf wound looking about the same, the biopsies reviewed show no atypical features and consistent with trauma or pressure injury or stasis change, patient has appointment to be scheduled with vascular We are using silver alginate and 3 layer compression Patient was started on Trental by dermatology which she has been taking for a week now 6/4; left lateral calf wound. I reviewed the dermatology notes with Dr.  Martin Majestic from [facility] dermatology. 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 Put her on pentoxifylline noteworthy that she is on Eliquis as well. We have been using silver alginate under compression. She has an appointment for repeat vascular studies on 7/6. This gets back to the fact that they did not take the wrap off on the original studies that were done. I do not believe she has an arterial issue. 6/10; left lateral calf wound. I put her on Hydrofera Blue last week. Some improvement in the surface condition of the wound. She has repeat vascular studies/reflux studies on 7/6. We are trying to get Apligraf through [place] [employer]. Her husband states he looked on the website this is not going to be approved. I am really not sure if this is a "class effect" 6/18; somewhat surprisingly Apligraf was approved and 100% covered. Her husband was also quite shocked by this. Nevertheless we have her repeat vascular studies on 7/6 and I will not be able to apply this until this is done. We are using Hydrofera Blue to the wound on the left. Her original wound on the right lower leg has maintain closure using compression stocking 6/25; repeat vascular studies/venous on 7/6; we will apply the Apligraf I think that week. We are also going to work around a week's vacation they have the first week in August. 7/2; reflux studies on 7/6;. Likely place first Apligraf from the major wound area on 7/9; 7/9; patient was kindly seen by Dr. Donnetta Hutching of vein and vascular to review her circulation status with the refractory wounds in the left leg. At first wounds in this clinic were on the right leg and they heal she now has a juxta lite stocking. Dr. Donnetta Hutching thought she had venous stasis disease with venous ulcerations. He did not feel that there was anything that would suggest to expect approval with a blush ablation of her  saphenous vein the saphenous veins were felt to be relatively small in caliber. She was not felt to have an arterial issue Apligraf #1 apply today in the standard fashion 7/23; the patient came in today complaining of a lot of pain in the left lower leg where the wound is. We applied Apligraf 2 weeks ago I also increased her compression from 3-4 layer wondering about the venous hypertension. She arrives today with her original wound that we have been treating with Apligraf looking quite healthy although there is some surface slough. Problematically she has de-epithelialized and developed erythema below the wound towards the lateral malleolus itself. This is somewhat angry. I think this is venous  stasis loss of epithelialization. I do not believe this is infection . Apligraf #2 applied to the original wound area 8/9-Patient returns at 2 weeks for Apligraf #3, the left leg venous leg ulcer area had more slough this time 8/23 Apligraf #4 her wound areas are small however she is still complaining of pain. She has previously been seen by vein and vascular. She has not felt to have an arterial issue she was felt to have venous stasis disease. She originally came here with a wound on her right leg on the right lateral calf this healed out. She then developed a difficult area on the left. She saw dermatology wondered whether this could be a vasculopathy. I am not sure if she is still on pentoxifylline the dermatology suggested I will have to clarify this with 9/7; patient complains of unrelenting pain making her life miserable. I been using Apligraf to close her major wound down and the major wound has come down to 2 open areas which are small and granulated. However there is erythema both inferiorly and superiorly cratered thick skin with wound issues between these areas extending into the lateral calf. There is erythema here and tenderness. I have been managing this largely as this this was severe stasis  dermatitis. Vein and vascular did not feel that she would benefit from an ablation. She went to see [facility] dermatology some months ago who wondered about a vasculopathy and suggested a biopsy but I am reluctant to go forward with that here. The patient is on Eliquis. 9/13; the patient saw Dr. Ubaldo Glassing on 9/8; she noted superficial cutaneous breakdown located on her left inferior lateral posterior tibia. She does not have a specific diagnosis for the patient's ulcer. Wanted to rule out squamous cell carcinoma and pyoderma gangrenosum she did a punch biopsy that is still bleeding. They came back for a dressing change after that procedure. They have still not heard the results of the biopsy. Was still using silver alginate as the primary dressing. She is tolerating the doxycycline although she had to take it with food 9/20; the biopsy that was done by dermatology/Dr. Ubaldo Glassing on 9/8 showed findings consistent with stasis dermatitis. I think we can put any thought of an alternative diagnosis to rest here. She is already seen vein and vascular today did not think she was a candidate for ablations. They felt she had adequate arterial flow. We have been using silver alginate and TCA I put her on doxycycline last time because of erythema around the wound which very well could be venous inflammation/stasis dermatitis but I had some concerns about cellulitis. Doxycycline being a good drug for this because of its anti-inflammatory as well as its antibiotic effects. We also received a note from the patient's cardiologist Dr. Darene Lamer aylor about interactions between doxycycline and propafenone. While I was aware of the potential interaction between propafenone and quinolones I was not aware of any interaction between doxycycline and propafenone. Nor can I find this interaction described. Nevertheless I will follow Dr. Tanna Furry expertise in this area. She was having nausea in any case and was about to stop it even without  cardiology advice. She was also in the ER because of bleeding from the biopsy site and in urgent care because of nausea Electronic Signature(s) Signed: 09/02/2020 4:19:57 PM By: Linton Ham MD Entered By: Linton Ham on 09/01/2020 09:05:44 -------------------------------------------------------------------------------- Physical Exam Details Patient Name: Date of Service: [name] 09/01/2020 8:30 A M Medical Record Number: [id number] Patient Account  Number: [id number] Date of Birth/Sex: Treating RN: 01-27-1933 (86 y.o. [name] Primary Care Provider: Patrina Levering Other Clinician: Referring Provider: Treating Provider/Extender: [name] in Treatment: 36 Constitutional Sitting or standing Blood Pressure is within target range for patient.. Pulse regular and within target range for patient.Marland Kitchen Respirations regular, non-labored and within target range.. Temperature is normal and within the target range for the patient.Marland Kitchen Appears in no distress. Cardiovascular Both dorsalis pedis pulses are palpable bilaterally. We have excellent edema control. Integumentary (Hair, Skin) The degree of erythema in the left leg is markedly improved versus last time 1 week ago. Notes Wound exam; left lateral ankle and calf. There is some debris in the area I did not remove this. There is 2 open areas 1 of these is the biopsy site and there is a small area superiorly that is still open. I vigorously washed off the area with wound cleanser and gauze. Also notable for the fact that there is no erythema here which is quite an improvement from last week Electronic Signature(s) Signed: 09/02/2020 4:19:57 PM By: Linton Ham MD Entered By: Linton Ham on 09/01/2020 09:07:26 -------------------------------------------------------------------------------- Physician Orders Details Patient Name: Date of Service: [name] 09/01/2020 8:30 A M Medical  Record Number: [id number] Patient Account Number: [id number] Date of Birth/Sex: Treating RN: [birth year] (86 y.o. [name] Primary Care Provider: Lajean Manes T Other Clinician: Referring Provider: Treating Provider/Extender: Evelena Peat in Treatment: 6 Verbal / Phone Orders: No Diagnosis Coding ICD-10 Coding Code Description I87.321 Chronic venous hypertension (idiopathic) with inflammation of right lower extremity L97.828 Non-pressure chronic ulcer of other part of left lower leg with other specified severity I87.322 Chronic venous hypertension (idiopathic) with inflammation of left lower extremity L97.522 Non-pressure chronic ulcer of other part of left foot with fat layer exposed Follow-up Appointments Return Appointment in 1 week. Dressing Change Frequency Wound #5 Left,Lateral Lower Leg Do not change entire dressing for one week. Skin Barriers/Peri-Wound Care Barrier cream Moisturizing lotion TCA Cream or Ointment - to red/inflamed areas Wound Cleansing May shower with protection. - use cast protector Primary Wound Dressing Wound #5 Left,Lateral Lower Leg Calcium Alginate with Silver Secondary Dressing Wound #5 Left,Lateral Lower Leg Dry Gauze ABD pad Edema Control 3 Layer Compression System - Left Lower Extremity Avoid standing for long periods of time Elevate legs to the level of the heart or above for 30 minutes daily and/or when sitting, a frequency of: - throughout the day. Exercise regularly Support Garment 20-30 mm/Hg pressure to: - patient to apply farrow wrap 4000 to right leg. Apply in the morning and remove at night. Off-Loading Open toe surgical shoe to: - left foot Electronic Signature(s) Signed: 09/01/2020 5:09:36 PM By: [name] RN, BSN Signed: 09/02/2020 4:19:57 PM By: Linton Ham MD Entered By: [name] on 09/01/2020  08:53:55 -------------------------------------------------------------------------------- Problem List Details Patient Name: Date of Service: [name], [place]. 09/01/2020 8:30 A M Medical Record Number: [id number] Patient Account Number: [id number] Date of Birth/Sex: Treating RN: 05-Feb-1933 (86 y.o. [name] Primary Care Provider: Patrina Levering Other Clinician: Referring Provider: Treating Provider/Extender: Evelena Peat in Treatment: 36 Active Problems ICD-10 Encounter Code Description Active Date MDM Diagnosis I87.321 Chronic venous hypertension (idiopathic) with inflammation of right lower 12/20/2019 No Yes extremity L97.828 Non-pressure chronic ulcer of other part of left lower leg with other specified 03/21/2020 No Yes severity I87.322 Chronic venous hypertension (idiopathic) with inflammation of  left lower 02/14/2020 No Yes extremity L97.522 Non-pressure chronic ulcer of other part of left foot with fat layer exposed 01/25/2020 No Yes Inactive Problems ICD-10 Code Description Active Date Inactive Date L97.812 Non-pressure chronic ulcer of other part of right lower leg with fat layer exposed 12/20/2019 12/20/2019 Resolved Problems Electronic Signature(s) Signed: 09/02/2020 4:19:57 PM By: Linton Ham MD Entered By: Linton Ham on 09/01/2020 09:01:56 -------------------------------------------------------------------------------- Progress Note Details Patient Name: Date of Service: [name] 09/01/2020 8:30 A M Medical Record Number: [id number] Patient Account Number: [id number] Date of Birth/Sex: Treating RN: May 02, 1933 (86 y.o. [name] Primary Care Provider: Patrina Levering Other Clinician: Referring Provider: Treating Provider/Extender: Evelena Peat in Treatment: 36 Subjective History of Present Illness (HPI) ADMISSION 12/20/2019 This is an 86 year old woman referred by her primary  physician Dr. Felipa Eth for review of a wound on her right lateral lower leg. She was actually in this clinic on 2 separate occasions in 2010 and 2012 cared for by Dr. Sherilyn Cooter. At that point in time she had wounds on her right leg as well. She tells [place] to 1 month ago she noticed a scab building up on her right lateral lower leg this opened into a wound. There was no overt cause of this no trauma, no infection that she is aware of. She has a history of chronic venous insufficiency and wears compression stockings fairly religiously indeed she is done well over the last 8 years since she was last in this clinic. She has been applying Vaseline on this and a covering phone. This is not progressing towards healing. Past medical history; interstitial lung disease, chronic atrial fibrillation status post pacemaker, osteoarthritis of the left knee, left breast CA, chronic repeat venous insufficiency. She takes Eliquis for her atrial fibrillation stroke prophylaxis. ABI in our clinic was 0.99 on the right 1/15; superficial wound on the right lateral calf in the setting of severe acute skin changes from chronic venous insufficiency and lymphedema. We used Iodoflex last week she complained of a lot of pain 1/22; this is a small but difficult wound on the right lateral calf in the setting of severe chronic venous insufficiency and secondary lymphedema. She continues to state the wounds things hurts when she is up on it but seems to be relieved by putting her leg up. I changed her to [facility] last week because the Iodoflex seem to be causing stinging. 1/29. This wound appears to be contracting somewhat. Changes of chronic venous insufficiency with secondary lymphedema 2/12; not as good as surface today and slightly bigger. I had to change her from Iodoflex to [facility] because of the stinging pain although she does not think it was any better on the [facility]. ooShe comes into clinic today with a  area on the medial left great toe. She said she noticed blood on her sock last Saturday. She had some form of bunion surgery by Dr. Ila Mcgill her podiatrist sometime in 2019 she said he "shave the area". I could not find his operative report although I did see reference to the bunion in the area. 2/19; somewhat improved wound on the right lateral lower leg however the wound she came in on the bunion of her left great toe is actually I think larger still somewhat inflamed. We have been using Iodoflex 2/26; not much improvement in either wound area which is the original venous wound on the right lateral lower leg and in the area on the  bunion of her left great toe. This still looks somewhat inflamed and tender. We have been using Iodoflex with open much improvement. 3/4; in general both her wounds look better this includes the original venous insufficiency wound on the right lateral lower leg and the area on her tip of her bunion on the left great toe medial aspect of the MTP. Change to Hydrofera Blue last week 3/12; the patient still has a small geographic shaped wound on the right lateral lower leg. We have been using Hydrofera Blue but I changed her to endoform today. She also has the area in the tip of the bunion of the left great toe. We will try Hydrofera Blue here as well 3/19; the small geographic wound on the right lateral lower leg has not filled in. There is some surrounding induration we have noticed this previously. This may be venous inflammation but I wonder about biopsying this if this is not closing up. The area over the left medial first MTP [bunion deformity] requires debridement. This is not closing in. I am not convinced she is offloading adequately 3/26; right lateral lower leg in the setting of severe venous insufficiency and also an area over the first MTP bunion deformity. No debridement is required. We have been using endoform 4/9; right lateral lower leg wound in the  setting of severe venous insufficiency and also a refractory area over the first MTP bunion deformity. The area on the right lateral lower leg is just about closed there is still a minor open area here. She comes in today with a mirror image area on the left lateral lower leg. She says this started as a scab 2 weeks ago and is gradually morphed into an open wound very similar to what she has on the right leg. She has severe bilateral venous insufficiency with venous hypertension obvious from clinical exam. 4/16; her original wound the right lateral leg wound in the setting of chronic venous hypertension is a small open area but very small. We have been using endoform. Her wound over the left first MTP bunion deformity also appears to be small and closing in. Unfortunately she has a large relative area on the left lateral calf which was new last week. Very adherent debris over this we have been using Iodoflex however that may be contributing to the debris on the wound surface 4/23; the original wound is closed and the area on the left first metatarsal bunion deformity is also almost closed the new area from last week required debridement. She went for her reflux studies that did not take her lower extremity wraps off. This is not helpful. She did have significant reflux in the right common femoral vein. I am not going to press this issue further. She clinically has severe venous hypertension 4/29; the original wound is closed on the right lateral lower calf. The area on the left first metatarsal/bunion deformity has a small slitlike opening that is still not closed. The real problem here is now wound on the left lateral calf which is necrotic and deep. We used Iodoflex on this wound last time. Silver alginate on the left first toe The patient has [place] wraps. We should be able to transition the right leg into compression stockings and were doing this today. 5/7; the original wound remains closed on  the right lateral calf. The left first metatarsal head still is open. Deterioration in the wound which is the new wound from several weeks ago on the left lateral calf.  The patient is not aware how this could have happened. We have been using Sorbact starting last week under 3 layer compression 5/14; the right lateral calf is closed the area on the first met head on the left is closed However the new area from 3 weeks ago on the left lateral calf continues to expand. There is raised nodular tissue around the wound necrotic debris on the surface. Circumference of the wound also looks necrotic. It looks as though there is involvement of the tissue under the skin around the large area of this wound which looks threatened. She is complaining of pain. Culture of this wound I did last week showed rare coag negative staph variant I have never heard of although I am doubtful this has clinical significance 05/09/20-Patient returns with a left lateral calf wound looking about the same, the biopsies reviewed show no atypical features and consistent with trauma or pressure injury or stasis change, patient has appointment to be scheduled with vascular We are using silver alginate and 3 layer compression Patient was started on Trental by dermatology which she has been taking for a week now 6/4; left lateral calf wound. I reviewed the dermatology notes with Dr. Martin Majestic from [facility] dermatology. 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 Put her on pentoxifylline noteworthy that she is on Eliquis as well. We have been using silver alginate under compression. She has an appointment for repeat vascular studies on 7/6. This gets back to the fact that they did not take the wrap off on the original studies that were done. I do not believe she has an arterial issue. 6/10; left lateral calf wound. I put her on Hydrofera Blue  last week. Some improvement in the surface condition of the wound. She has repeat vascular studies/reflux studies on 7/6. We are trying to get Apligraf through [place] [employer]. Her husband states he looked on the website this is not going to be approved. I am really not sure if this is a "class effect" 6/18; somewhat surprisingly Apligraf was approved and 100% covered. Her husband was also quite shocked by this. Nevertheless we have her repeat vascular studies on 7/6 and I will not be able to apply this until this is done. We are using Hydrofera Blue to the wound on the left. Her original wound on the right lower leg has maintain closure using compression stocking 6/25; repeat vascular studies/venous on 7/6; we will apply the Apligraf I think that week. We are also going to work around a week's vacation they have the first week in August. 7/2; reflux studies on 7/6;. Likely place first Apligraf from the major wound area on 7/9; 7/9; patient was kindly seen by Dr. Donnetta Hutching of vein and vascular to review her circulation status with the refractory wounds in the left leg. At first wounds in this clinic were on the right leg and they heal she now has a juxta lite stocking. Dr. Donnetta Hutching thought she had venous stasis disease with venous ulcerations. He did not feel that there was anything that would suggest to expect approval with a blush ablation of her saphenous vein the saphenous veins were felt to be relatively small in caliber. She was not felt to have an arterial issue Apligraf #1 apply today in the standard fashion 7/23; the patient came in today complaining of a lot of pain in the left lower leg where the wound is. We applied Apligraf 2  weeks ago I also increased her compression from 3-4 layer wondering about the venous hypertension. She arrives today with her original wound that we have been treating with Apligraf looking quite healthy although there is some surface slough. Problematically she has  de-epithelialized and developed erythema below the wound towards the lateral malleolus itself. This is somewhat angry. I think this is venous stasis loss of epithelialization. I do not believe this is infection . Apligraf #2 applied to the original wound area 8/9-Patient returns at 2 weeks for Apligraf #3, the left leg venous leg ulcer area had more slough this time 8/23 Apligraf #4 her wound areas are small however she is still complaining of pain. She has previously been seen by vein and vascular. She has not felt to have an arterial issue she was felt to have venous stasis disease. She originally came here with a wound on her right leg on the right lateral calf this healed out. She then developed a difficult area on the left. She saw dermatology wondered whether this could be a vasculopathy. I am not sure if she is still on pentoxifylline the dermatology suggested I will have to clarify this with 9/7; patient complains of unrelenting pain making her life miserable. I been using Apligraf to close her major wound down and the major wound has come down to 2 open areas which are small and granulated. However there is erythema both inferiorly and superiorly cratered thick skin with wound issues between these areas extending into the lateral calf. There is erythema here and tenderness. I have been managing this largely as this this was severe stasis dermatitis. Vein and vascular did not feel that she would benefit from an ablation. She went to see [facility] some months ago who wondered about a vasculopathy and suggested a biopsy but I am reluctant to go forward with that here. The patient is on Eliquis. 9/13; the patient saw Dr. Ubaldo Glassing on 9/8; she noted superficial cutaneous breakdown located on her left inferior lateral posterior tibia. She does not have a specific diagnosis for the patient's ulcer. Wanted to rule out squamous cell carcinoma and pyoderma gangrenosum she did a punch biopsy  that is still bleeding. They came back for a dressing change after that procedure. They have still not heard the results of the biopsy. Was still using silver alginate as the primary dressing. She is tolerating the doxycycline although she had to take it with food 9/20; the biopsy that was done by dermatology/Dr. Ubaldo Glassing on 9/8 showed findings consistent with stasis dermatitis. I think we can put any thought of an alternative diagnosis to rest here. She is already seen vein and vascular today did not think she was a candidate for ablations. They felt she had adequate arterial flow. We have been using silver alginate and TCA I put her on doxycycline last time because of erythema around the wound which very well could be venous inflammation/stasis dermatitis but I had some concerns about cellulitis. Doxycycline being a good drug for this because of its anti-inflammatory as well as its antibiotic effects. We also received a note from the patient's cardiologist Dr. Darene Lamer aylor about interactions between doxycycline and propafenone. While I was aware of the potential interaction between propafenone and quinolones I was not aware of any interaction between doxycycline and propafenone. Nor can I find this interaction described. Nevertheless I will follow Dr. Tanna Furry expertise in this area. She was having nausea in any case and was about to stop it even without  cardiology advice. She was also in the ER because of bleeding from the biopsy site and in urgent care because of nausea Objective Constitutional Sitting or standing Blood Pressure is within target range for patient.. Pulse regular and within target range for patient.Marland Kitchen Respirations regular, non-labored and within target range.. Temperature is normal and within the target range for the patient.Marland Kitchen Appears in no distress. Vitals Time Taken: 8:37 AM, Height: 69 in, Weight: 163 lbs, BMI: 24.1, Temperature: 97.8 F, Pulse: 77 bpm, Respiratory Rate: 18  breaths/min, Blood Pressure: 126/70 mmHg. Cardiovascular Both dorsalis pedis pulses are palpable bilaterally. We have excellent edema control. General Notes: Wound exam; left lateral ankle and calf. There is some debris in the area I did not remove this. There is 2 open areas 1 of these is the biopsy site and there is a small area superiorly that is still open. I vigorously washed off the area with wound cleanser and gauze. Also notable for the fact that there is no erythema here which is quite an improvement from last week Integumentary (Hair, Skin) The degree of erythema in the left leg is markedly improved versus last time 1 week ago. Wound #5 status is Open. Original cause of wound was Gradually Appeared. The wound is located on the Left,Lateral Lower Leg. The wound measures 7cm length x 2.5cm width x 0.2cm depth; 13.744cm^2 area and 2.749cm^3 volume. There is Fat Layer (Subcutaneous Tissue) exposed. There is no tunneling or undermining noted. There is a medium amount of serosanguineous drainage noted. The wound margin is distinct with the outline attached to the wound base. There is medium (34-66%) red, pink granulation within the wound bed. There is a medium (34-66%) amount of necrotic tissue within the wound bed including Adherent Slough. 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 There was a Three Layer Compression Therapy Procedure by [name], RN. Post procedure Diagnosis Wound #5: Same as Pre-Procedure Plan Follow-up Appointments: Return  Appointment in 1 week. Dressing Change Frequency: Wound #5 Left,Lateral Lower Leg: Do not change entire dressing for one week. Skin Barriers/Peri-Wound Care: Barrier cream Moisturizing lotion TCA Cream or Ointment - to red/inflamed areas Wound Cleansing: May shower with protection. - use cast protector Primary Wound Dressing: Wound #5 Left,Lateral Lower Leg: Calcium Alginate with Silver Secondary Dressing: Wound #5 Left,Lateral Lower Leg: Dry Gauze ABD pad Edema Control: 3 Layer Compression System - Left Lower Extremity Avoid standing for long periods of time Elevate legs to the level of the heart or above for 30 minutes daily and/or when sitting, a frequency of: - throughout the day. Exercise regularly Support Garment 20-30 mm/Hg pressure to: - patient to apply farrow wrap 4000 to right leg. Apply in the morning and remove at night. Off-Loading: Open toe surgical shoe to: - left foot #1 I am continuing with silver alginate 2. Liberal TCA to the periwound to control stasis dermatitis 3. Her wound condition look a lot better this week. We have excellent edema control in the absence of inflammation/infection is gratifying 4. She does not require any additional antibiotics. Noteworthy that she is on propafenone and even though I cannot find any  interaction between doxycycline and propafenone described I will be out of Dr. Darene Lamer aylor's expertise in this area. This would however preclude quinolones 5. The patient came into our clinic with wounds on the right. We got those to heal and now she uses compression stockings. Then she developed the area on the left that we are dealing with currently. T have compression stockings I believe for the left leg in the eventuality that this closes. o 6. Work-up this time which is included repeat biopsies both here and at dermatology has not suggested any further need for this in the future. In particular she does not have any noninfectious inflammatory  ulcer such as pyoderma gangrenosum, nor is there any evidence of malignancy Electronic Signature(s) Signed: 09/02/2020 4:19:57 PM By: Linton Ham MD Entered By: Linton Ham on 09/01/2020 09:10:27 -------------------------------------------------------------------------------- SuperBill Details Patient Name: Date of Service: [name] 09/01/2020 Medical Record Number: [id number] Patient Account Number: [id number] Date of Birth/Sex: Treating RN: January 21, 1933 (86 y.o. [name] Primary Care Provider: Lajean Manes T Other Clinician: Referring Provider: Treating Provider/Extender: Evelena Peat in Treatment: 36 Diagnosis Coding ICD-10 Codes Code Description [phone] Chronic venous hypertension (idiopathic) with inflammation of right lower extremity L97.828 Non-pressure chronic ulcer of other part of left lower leg with other specified severity I87.322 Chronic venous hypertension (idiopathic) with inflammation of left lower extremity L97.522 Non-pressure chronic ulcer of other part of left foot with fat layer exposed Facility Procedures The patient participates with Medicare or their insurance follows the Medicare Facility Guidelines: CPT4 Code Description Modifier Quantity [id number] (Facility Use Only) [phone] - [facility] Physician Procedures : CPT4 Code Description Modifier [id number] - WC PHYS LEVEL 4 - EST PT ICD-10 Diagnosis Description I87.321 Chronic venous hypertension (idiopathic) with inflammation of right lower extremity L97.828 Non-pressure chronic ulcer of other part of left  lower leg with other specified severity I87.322 Chronic venous hypertension (idiopathic) with inflammation of left lower extremity Quantity: 1 Electronic Signature(s) Signed: 09/02/2020 4:19:57 PM By: Linton Ham MD Entered By: Linton Ham on 09/01/2020 09:10:55

## 2020-09-02 NOTE — Progress Notes (Signed)
[name], [name] ([id number]) Visit Report for 07/04/2020 Arrival Information Details Patient Name: Date of Service: [place], [name] 07/04/2020 8:30 A M Medical Record Number: [id number] Patient Account Number: [id number] Date of Birth/Sex: Treating RN: [birth year] (86 y.o. [name] Primary Care Lashunda Greis: Lajean Manes T Other Clinician: Referring Tallyn Holroyd: Treating Trinnity Breunig/Extender: Evelena Peat in Treatment: 28 Visit Information History Since Last Visit Added or deleted any medications: No Patient Arrived: Ambulatory Any new allergies or adverse reactions: No Arrival Time: 08:55 Had a fall or experienced change in No Accompanied By: husband activities of daily living that may affect Transfer Assistance: None risk of falls: Patient Identification Verified: Yes Signs or symptoms of abuse/neglect since last visito No Secondary Verification Process Completed: Yes Hospitalized since last visit: No Patient Requires Transmission-Based Precautions: No Implantable device outside of the clinic excluding No Patient Has Alerts: Yes cellular tissue based products placed in the center Patient Alerts: Patient on Blood Thinner since last visit: Right ABI:0.99 Has Dressing in Place as Prescribed: Yes Pain Present Now: Yes Electronic Signature(s) Signed: 09/02/2020 8:09:04 AM By: Sandre Kitty Entered By: Sandre Kitty on 07/04/2020 08:56:28 -------------------------------------------------------------------------------- Compression Therapy Details Patient Name: Date of Service: [name] 07/04/2020 8:30 A M Medical Record Number: [id number] Patient Account Number: [id number] Date of Birth/Sex: Treating RN: 1933-08-29 (86 y.o. [name] Primary Care Mikailah Morel: Patrina Levering Other Clinician: Referring Forest Pruden: Treating Shondale Quinley/Extender: [name] in Treatment: 28 Compression Therapy Performed for  Wound Assessment: Wound #5 Left,Lateral Lower Leg Performed By: Clinician [name], RN Compression Type: Three Layer Post Procedure Diagnosis Same as Pre-procedure Electronic Signature(s) Signed: 07/04/2020 5:51:32 PM By: Kela Millin Entered By: Kela Millin on 07/04/2020 09:34:48 -------------------------------------------------------------------------------- Compression Therapy Details Patient Name: Date of Service: [name] 07/04/2020 8:30 A M Medical Record Number: [id number] Patient Account Number: [id number] Date of Birth/Sex: Treating RN: Nov 10, 1933 (86 y.o. [name] Primary Care Layonna Dobie: Patrina Levering Other Clinician: Referring Icarus Partch: Treating Caelynn Marshman/Extender: [name] in Treatment: 28 Compression Therapy Performed for Wound Assessment: Wound #7 Left,Distal,Lateral Lower Leg Performed By: Clinician [name], RN Compression Type: Three Layer Post Procedure Diagnosis Same as Pre-procedure Electronic Signature(s) Signed: 07/04/2020 5:51:32 PM By: Kela Millin Entered By: Kela Millin on 07/04/2020 09:34:48 -------------------------------------------------------------------------------- Encounter Discharge Information Details Patient Name: Date of Service: [name] 07/04/2020 8:30 A M Medical Record Number: [id number] Patient Account Number: [id number] Date of Birth/Sex: Treating RN: [birth year] (86 y.o. [name] Primary Care Breianna Delfino: Lajean Manes T Other Clinician: Referring Jaylyn Iyer: Treating Georgie Eduardo/Extender: Evelena Peat in Treatment: 28 Encounter Discharge Information Items Post Procedure Vitals Discharge Condition: Stable Temperature (F): 97.8 Ambulatory Status: Ambulatory Pulse (bpm): 40 Discharge Destination: Home Respiratory Rate (breaths/min): 18 Transportation: Private Auto Blood Pressure (mmHg): 136/72 Accompanied  By: spouse Schedule Follow-up Appointment: Yes Clinical Summary of Care: Patient Declined Electronic Signature(s) Signed: 07/04/2020 5:49:13 PM By: [name] RN, BSN Entered By: [name] on 07/04/2020 09:45:37 -------------------------------------------------------------------------------- Lower Extremity Assessment Details Patient Name: Date of Service: [place], [place]. 07/04/2020 8:30 A M Medical Record Number: [id number] Patient Account Number: [id number] Date of Birth/Sex: Treating RN: 18-Sep-1933 (86 y.o. [name] Primary Care Bret Stamour: Patrina Levering Other Clinician: Referring Cameron Katayama: Treating Eathen Budreau/Extender: Venita Lick T Weeks in Treatment: 28 Edema Assessment Assessed: [Left: No] [Right: No] Edema: [Left: N] [Right: o] Calf Left: Right: Point of Measurement: 41  cm From Medial Instep 34 cm cm Ankle Left: Right: Point of Measurement: 14 cm From Medial Instep 23 cm cm Vascular Assessment Pulses: Dorsalis Pedis Palpable: [Left:Yes] Electronic Signature(s) Signed: 07/04/2020 5:51:32 PM By: Kela Millin Entered By: Kela Millin on 07/04/2020 09:11:09 -------------------------------------------------------------------------------- Multi Wound Chart Details Patient Name: Date of Service: [name] 07/04/2020 8:30 A M Medical Record Number: [id number] Patient Account Number: [id number] Date of Birth/Sex: Treating RN: [birth year] (86 y.o. [name] Primary Care Emonee Winkowski: Lajean Manes T Other Clinician: Referring Doralyn Kirkes: Treating Robbin Escher/Extender: [name] in Treatment: 28 Vital Signs Height(in): 69 Pulse(bpm): 40 Weight(lbs): 163 Blood Pressure(mmHg): 136/72 Body Mass Index(BMI): 24 Temperature(F): 97.8 Respiratory Rate(breaths/min): 18 Photos: [5:No Photos Left, Lateral Lower Leg] [7:No Photos Left, Distal, Lateral Lower Leg] [N/A:N/A N/A] Wound  Location: [5:Gradually Appeared] [7:Gradually Appeared] [N/A:N/A] Wounding Event: [5:Venous Leg Ulcer] [7:Venous Leg Ulcer] [N/A:N/A] Primary Etiology: [5:Cataracts, Anemia, Arrhythmia,] [7:Cataracts, Anemia, Arrhythmia,] [N/A:N/A] Comorbid History: [5:Congestive Heart Failure, Hypertension, Peripheral Venous Disease, Received Radiation 03/21/2020] [7:Congestive Heart Failure, Hypertension, Peripheral Venous Disease, Received Radiation 05/30/2020] [N/A:N/A] Date Acquired: [5:15] [7:5] [N/A:N/A] Weeks of Treatment: [5:Open] [7:Open] [N/A:N/A] Wound Status: [5:No] [7:Yes] [N/A:N/A] Clustered Wound: [5:N/A] [7:2] [N/A:N/A] Clustered Quantity: [5:3.3x3x0.1] [7:2x1x0.1] [N/A:N/A] Measurements L x W x D (cm) [5:7.775] [7:1.571] [N/A:N/A] A (cm) : rea [5:0.778] [7:0.157] [N/A:N/A] Volume (cm) : [5:-1445.70%] [7:-900.60%] [N/A:N/A] % Reduction in Area: [5:-1456.00%] [7:-881.20%] [N/A:N/A] % Reduction in Volume: [5:Full Thickness Without Exposed] [7:Full Thickness Without Exposed] [N/A:N/A] Classification: [5:Support Structures Medium] [7:Support Structures Medium] [N/A:N/A] Exudate Amount: [5:Serosanguineous] [7:Serosanguineous] [N/A:N/A] Exudate Type: [5:red, brown] [7:red, brown] [N/A:N/A] Exudate Color: [5:Flat and Intact] [7:Flat and Intact] [N/A:N/A] Wound Margin: [5:Medium (34-66%)] [7:Small (1-33%)] [N/A:N/A] Granulation Amount: [5:Red] [7:Pink] [N/A:N/A] Granulation Quality: [5:Medium (34-66%)] [7:Large (67-100%)] [N/A:N/A] Necrotic Amount: [5:Fat Layer (Subcutaneous Tissue): Yes Fat Layer (Subcutaneous Tissue): Yes N/A] Exposed Structures: [5:Fascia: No Tendon: No Muscle: No Joint: No Bone: No Small (1-33%)] [7:Fascia: No Tendon: No Muscle: No Joint: No Bone: No Small (1-33%)] [N/A:N/A] Epithelialization: [5:Cellular or Tissue Based Product] [7:N/A] [N/A:N/A] Treatment Notes Electronic Signature(s) Signed: 07/04/2020 5:20:32 PM By: Linton Ham MD Signed: 07/04/2020 5:51:32 PM By:  Kela Millin Entered By: Linton Ham on 07/04/2020 09:29:40 -------------------------------------------------------------------------------- Multi-Disciplinary Care Plan Details Patient Name: Date of Service: [place], [place]. 07/04/2020 8:30 A M Medical Record Number: [id number] Patient Account Number: [id number] Date of Birth/Sex: Treating RN: 24-May-1933 (85 y.o. [name] Primary Care Oneika Simonian: Patrina Levering Other Clinician: Referring Vere Diantonio: Treating Washington Whedbee/Extender: Evelena Peat in Treatment: 28 Active Inactive Abuse / Safety / Falls / Self Care Management Nursing Diagnoses: Potential for falls Goals: Patient/caregiver will verbalize understanding of skin care regimen Date Initiated: 12/20/2019 Date Inactivated: 02/14/2020 Target Resolution Date: 02/22/2020 Goal Status: Met Patient/caregiver will verbalize/demonstrate understanding of what to do in case of emergency Date Initiated: 12/20/2019 Target Resolution Date: 07/25/2020 Goal Status: Active Interventions: Assess fall risk on admission and as needed Provide education on fall prevention Notes: Pain, Acute or Chronic Nursing Diagnoses: Pain, acute or chronic: actual or potential Potential alteration in comfort, pain Goals: Patient will verbalize adequate pain control and receive pain control interventions during procedures as needed Date Initiated: 12/20/2019 Date Inactivated: 02/14/2020 Target Resolution Date: 02/22/2020 Goal Status: Met Patient/caregiver will verbalize comfort level met Date Initiated: 12/20/2019 Target Resolution Date: 07/25/2020 Goal Status: Active Interventions: Complete pain assessment as per visit requirements Provide education on pain management Notes: Wound/Skin Impairment Nursing Diagnoses: Knowledge deficit related to ulceration/compromised skin integrity  Goals: Patient/caregiver will verbalize understanding of skin care regimen Date  Initiated: 12/20/2019 Target Resolution Date: 07/25/2020 Goal Status: Active Interventions: Assess patient/caregiver ability to perform ulcer/skin care regimen upon admission and as needed Provide education on ulcer and skin care Treatment Activities: Skin care regimen initiated : 12/20/2019 Topical wound management initiated : 12/20/2019 Notes: Electronic Signature(s) Signed: 07/04/2020 5:51:32 PM By: Kela Millin Entered By: Kela Millin on 07/04/2020 09:06:09 -------------------------------------------------------------------------------- Pain Assessment Details Patient Name: Date of Service: [name] 07/04/2020 8:30 A M Medical Record Number: [id number] Patient Account Number: [id number] Date of Birth/Sex: Treating RN: 05-25-1933 (86 y.o. [name] Primary Care Janard Culp: Patrina Levering Other Clinician: Referring Labaron Digirolamo: Treating Krysti Hickling/Extender: Evelena Peat in Treatment: 28 Active Problems Location of Pain Severity and Description of Pain Patient Has Paino Yes Site Locations Rate the pain. Current Pain Level: 5 Pain Management and Medication Current Pain Management: Electronic Signature(s) Signed: 07/04/2020 5:51:32 PM By: Kela Millin Signed: 09/02/2020 8:09:04 AM By: Sandre Kitty Entered By: Sandre Kitty on 07/04/2020 08:58:21 -------------------------------------------------------------------------------- Patient/Caregiver Education Details Patient Name: Date of Service: [name] 7/23/2021andnbsp8:30 A M Medical Record Number: [id number] Patient Account Number: [id number] Date of Birth/Gender: Treating RN: 08-22-1933 (86 y.o. [name] Primary Care Physician: Lajean Manes T Other Clinician: Referring Physician: Treating Physician/Extender: Evelena Peat in Treatment: 28 Education Assessment Education Provided To: Patient Education Topics  Provided Pain: Handouts: A Guide to Pain Control Methods: Explain/Verbal Responses: State content correctly Safety: Handouts: Personal Safety Methods: Explain/Verbal Responses: State content correctly Wound/Skin Impairment: Handouts: Caring for Your Ulcer Methods: Explain/Verbal Responses: State content correctly Electronic Signature(s) Signed: 07/04/2020 5:51:32 PM By: Kela Millin Entered By: Kela Millin on 07/04/2020 09:06:35 -------------------------------------------------------------------------------- Wound Assessment Details Patient Name: Date of Service: [name] 07/04/2020 8:30 A M Medical Record Number: [id number] Patient Account Number: [id number] Date of Birth/Sex: Treating RN: 1933/09/14 (86 y.o. [name] Primary Care Tychelle Purkey: Lajean Manes T Other Clinician: Referring Skyanne Welle: Treating Naitik Hermann/Extender: [name] in Treatment: 28 Wound Status Wound Number: 5 Primary Venous Leg Ulcer Etiology: Wound Location: Left, Lateral Lower Leg Wound Open Wounding Event: Gradually Appeared Status: Date Acquired: 03/21/2020 Comorbid Cataracts, Anemia, Arrhythmia, Congestive Heart Failure, Weeks Of Treatment: 15 History: Hypertension, Peripheral Venous Disease, Received Radiation Clustered Wound: No Photos Photo Uploaded By: Mikeal Hawthorne on 07/04/2020 13:41:11 Wound Measurements Length: (cm) 3.3 Width: (cm) 3 Depth: (cm) 0.1 Area: (cm) 7.775 Volume: (cm) 0.778 % Reduction in Area: -1445.7% % Reduction in Volume: -1456% Epithelialization: Small (1-33%) Tunneling: No Undermining: No Wound Description Classification: Full Thickness Without Exposed Support Structures Wound Margin: Flat and Intact Exudate Amount: Medium Exudate Type: Serosanguineous Exudate Color: red, brown Foul Odor After Cleansing: No Slough/Fibrino Yes Wound Bed Granulation Amount: Medium (34-66%) Exposed  Structure Granulation Quality: Red Fascia Exposed: No Necrotic Amount: Medium (34-66%) Fat Layer (Subcutaneous Tissue) Exposed: Yes Necrotic Quality: Adherent Slough Tendon Exposed: No Muscle Exposed: No Joint Exposed: No Bone Exposed: No Electronic Signature(s) Signed: 07/04/2020 5:51:32 PM By: Kela Millin Entered By: Kela Millin on 07/04/2020 09:12:35 -------------------------------------------------------------------------------- Wound Assessment Details Patient Name: Date of Service: [name] 07/04/2020 8:30 A M Medical Record Number: [id number] Patient Account Number: [id number] Date of Birth/Sex: Treating RN: March 09, 1933 (86 y.o. [name] Primary Care Raquon Milledge: Patrina Levering Other Clinician: Referring Wilfrid Hyser: Treating Cylee Dattilo/Extender: [name] in Treatment: 28 Wound Status Wound Number: 7 Primary Venous Leg  Ulcer Etiology: Wound Location: Left, Distal, Lateral Lower Leg Wound Open Wounding Event: Gradually Appeared Status: Date Acquired: 05/30/2020 Comorbid Cataracts, Anemia, Arrhythmia, Congestive Heart Failure, Weeks Of Treatment: 5 History: Hypertension, Peripheral Venous Disease, Received Radiation Clustered Wound: Yes Photos Photo Uploaded By: Mikeal Hawthorne on 07/04/2020 13:41:11 Wound Measurements Length: (cm) 2 Width: (cm) 1 Depth: (cm) 0.1 Clustered Quantity: 2 Area: (cm) 1.571 Volume: (cm) 0.157 % Reduction in Area: -900.6% % Reduction in Volume: -881.2% Epithelialization: Small (1-33%) Tunneling: No Undermining: No Wound Description Classification: Full Thickness Without Exposed Support Structures Wound Margin: Flat and Intact Exudate Amount: Medium Exudate Type: Serosanguineous Exudate Color: red, brown Foul Odor After Cleansing: No Slough/Fibrino Yes Wound Bed Granulation Amount: Small (1-33%) Exposed Structure Granulation Quality: Pink Fascia Exposed: No Necrotic  Amount: Large (67-100%) Fat Layer (Subcutaneous Tissue) Exposed: Yes Necrotic Quality: Adherent Slough Tendon Exposed: No Muscle Exposed: No Joint Exposed: No Bone Exposed: No Electronic Signature(s) Signed: 07/04/2020 5:51:32 PM By: Kela Millin Entered By: Kela Millin on 07/04/2020 09:12:08 -------------------------------------------------------------------------------- Vitals Details Patient Name: Date of Service: [name] 07/04/2020 8:30 A M Medical Record Number: [id number] Patient Account Number: [id number] Date of Birth/Sex: Treating RN: 04-28-1933 (86 y.o. [name] Primary Care Valiant Dills: Lajean Manes T Other Clinician: Referring Ahana Najera: Treating Brendi Mccarroll/Extender: [name] in Treatment: 28 Vital Signs Time Taken: 08:57 Temperature (F): 97.8 Height (in): 69 Pulse (bpm): 40 Weight (lbs): 163 Respiratory Rate (breaths/min): 18 Body Mass Index (BMI): 24.1 Blood Pressure (mmHg): 136/72 Reference Range: 80 - 120 mg / dl Electronic Signature(s) Signed: 09/02/2020 8:09:04 AM By: Sandre Kitty Entered By: Sandre Kitty on 07/04/2020 08:58:08

## 2020-09-02 NOTE — Telephone Encounter (Signed)
Pt c/o medication issue:  1. Name of Medication: propafenone (RYTHMOL SR) 225 MG 12 hr capsule  2. How are you currently taking this medication (dosage and times per day)? As directed  3. Are you having a reaction (difficulty breathing--STAT)? possibly  4. What is your medication issue? Patient's husband states that his wife has been nauseous since last Wednesday. They are trying to figure out what is causing this and know that she started on this medication at the beginning of the month. He wonders if this may be the cause of the nausea. Please advise.

## 2020-09-02 NOTE — Progress Notes (Signed)
[name], [name] ([id number]) Visit Report for 09/01/2020 Arrival Information Details Patient Name: Date of Service: [name], [name] 09/01/2020 8:30 A M Medical Record Number: [id number] Patient Account Number: [id number] Date of Birth/Sex: Treating RN: October 25, 1933 (86 y.o. [name] Primary Care Temia Debroux: Lajean Manes T Other Clinician: Referring Izela Altier: Treating Jashawn Floyd/Extender: Evelena Peat in Treatment: 53 Visit Information History Since Last Visit All ordered tests and consults were completed: No Patient Arrived: Ambulatory Added or deleted any medications: No Arrival Time: 08:37 Any new allergies or adverse reactions: No Accompanied By: husband Had a fall or experienced change in No Transfer Assistance: None activities of daily living that may affect Patient Identification Verified: Yes risk of falls: Secondary Verification Process Completed: Yes Signs or symptoms of abuse/neglect since last visito No Patient Requires Transmission-Based Precautions: No Hospitalized since last visit: No Patient Has Alerts: Yes Implantable device outside of the clinic excluding No Patient Alerts: Patient on Blood Thinner cellular tissue based products placed in the center Right ABI:0.99 since last visit: Has Dressing in Place as Prescribed: Yes Has Compression in Place as Prescribed: Yes Pain Present Now: Yes Electronic Signature(s) Signed: 09/01/2020 1:15:48 PM By: Carlene Coria RN Entered By: Carlene Coria on 09/01/2020 08:37:49 -------------------------------------------------------------------------------- Compression Therapy Details Patient Name: Date of Service: Tracy Sermon. 09/01/2020 8:30 A M Medical Record Number: [id number] Patient Account Number: [id number] Date of Birth/Sex: Treating RN: May 29, 1933 (86 y.o. [name] Primary Care Griselda Bramblett: Patrina Levering Other Clinician: Referring Jamonica Schoff: Treating Teona Vargus/Extender: Evelena Peat in Treatment: 36 Compression Therapy Performed for Wound Assessment: Wound #5 Left,Lateral Lower Leg Performed By: Clinician [name], RN Compression Type: Three Layer Post Procedure Diagnosis Same as Pre-procedure Electronic Signature(s) Signed: 09/01/2020 5:09:36 PM By: [name] RN, BSN Entered By: [name] on 09/01/2020 08:54:37 -------------------------------------------------------------------------------- Encounter Discharge Information Details Patient Name: Date of Service: [name], [place]. 09/01/2020 8:30 A M Medical Record Number: [id number] Patient Account Number: [id number] Date of Birth/Sex: Treating RN: 09/10/1933 (86 y.o. [name] Primary Care Hatice Bubel: Patrina Levering Other Clinician: Referring Delvon Chipps: Treating Declin Rajan/Extender: Evelena Peat in Treatment: 41 Encounter Discharge Information Items Discharge Condition: Stable Ambulatory Status: Ambulatory Discharge Destination: Home Transportation: Private Auto Accompanied By: husband Schedule Follow-up Appointment: Yes Clinical Summary of Care: Electronic Signature(s) Signed: 09/01/2020 5:10:33 PM By: Deon Pilling Entered By: Deon Pilling on 09/01/2020 09:18:41 -------------------------------------------------------------------------------- Lower Extremity Assessment Details Patient Name: Date of Service: [name], [name] 09/01/2020 8:30 A M Medical Record Number: [id number] Patient Account Number: [id number] Date of Birth/Sex: Treating RN: August 20, 1933 (86 y.o. [name] Primary Care Zaid Tomes: Lajean Manes T Other Clinician: Referring Katrina Daddona: Treating Bettye Sitton/Extender: [name] in Treatment: 36 Edema Assessment Assessed: [Left: No] [Right: No] Edema: [Left: No] [Right: No] Calf Left: Right: Point of Measurement: 41 cm From Medial Instep 30 cm 30 cm Ankle Left: Right: Point  of Measurement: 14 cm From Medial Instep 23 cm 20 cm Vascular Assessment Pulses: Dorsalis Pedis Palpable: [Left:Yes] [Right:Yes] Electronic Signature(s) Signed: 09/01/2020 1:15:48 PM By: Carlene Coria RN Signed: 09/01/2020 5:09:36 PM By: [name] RN, BSN Entered By: [name] on 09/01/2020 09:06:20 -------------------------------------------------------------------------------- Multi Wound Chart Details Patient Name: Date of Service: Tracy Sermon. 09/01/2020 8:30 A M Medical Record Number: [id number] Patient Account Number: [id number] Date of Birth/Sex: Treating RN: [birth year] (86 y.o. [name] Primary Care Cuma Polyakov: Patrina Levering Other Clinician: Referring  Providence Stivers: Treating Ameliya Nicotra/Extender: [name] in Treatment: 36 Vital Signs Height(in): 69 Pulse(bpm): 77 Weight(lbs): 163 Blood Pressure(mmHg): 126/70 Body Mass Index(BMI): 24 Temperature(F): 97.8 Respiratory Rate(breaths/min): 18 Photos: [5:No Photos Left, Lateral Lower Leg] [N/A:N/A N/A] Wound Location: [5:Gradually Appeared] [N/A:N/A] Wounding Event: [5:Venous Leg Ulcer] [N/A:N/A] Primary Etiology: [5:Cataracts, Anemia, Arrhythmia,] [N/A:N/A] Comorbid History: [5:Congestive Heart Failure, Hypertension, Peripheral Venous Disease, Received Radiation 03/21/2020] [N/A:N/A] Date Acquired: [5:23] [N/A:N/A] Weeks of Treatment: [5:Open] [N/A:N/A] Wound Status: [5:7x2.5x0.2] [N/A:N/A] Measurements L x W x D (cm) [5:13.744] [N/A:N/A] A (cm) : rea [5:2.749] [N/A:N/A] Volume (cm) : [5:-2632.40%] [N/A:N/A] % Reduction in Area: [5:-5398.00%] [N/A:N/A] % Reduction in Volume: [5:Full Thickness Without Exposed] [N/A:N/A] Classification: [5:Support Structures Medium] [N/A:N/A] Exudate Amount: [5:Serosanguineous] [N/A:N/A] Exudate Type: [5:red, brown] [N/A:N/A] Exudate Color: [5:Distinct, outline attached] [N/A:N/A] Wound Margin: [5:Medium (34-66%)] [N/A:N/A] Granulation  Amount: [5:Red, Pink] [N/A:N/A] Granulation Quality: [5:Medium (34-66%)] [N/A:N/A] Necrotic Amount: [5:Fat Layer (Subcutaneous Tissue): Yes N/A] Exposed Structures: [5:Fascia: No Tendon: No Muscle: No Joint: No Bone: No Small (1-33%)] [N/A:N/A] Epithelialization: [5:Compression Therapy] [N/A:N/A] Treatment Notes Electronic Signature(s) Signed: 09/01/2020 5:09:36 PM By: [name] RN, BSN Signed: 09/02/2020 4:19:57 PM By: Linton Ham MD Entered By: Linton Ham on 09/01/2020 09:02:02 -------------------------------------------------------------------------------- Multi-Disciplinary Care Plan Details Patient Name: Date of Service: [name], [place]. 09/01/2020 8:30 A M Medical Record Number: [id number] Patient Account Number: [id number] Date of Birth/Sex: Treating RN: 05/11/1933 (86 y.o. [name] Primary Care Maddyn Lieurance: Other Clinician: Patrina Levering Referring Cindra Austad: Treating Quintavius Niebuhr/Extender: [name] in Treatment: 36 Active Inactive Wound/Skin Impairment Nursing Diagnoses: Knowledge deficit related to ulceration/compromised skin integrity Goals: Patient/caregiver will verbalize understanding of skin care regimen Date Initiated: 12/20/2019 Target Resolution Date: 09/26/2020 Goal Status: Active Interventions: Assess patient/caregiver ability to perform ulcer/skin care regimen upon admission and as needed Provide education on ulcer and skin care Treatment Activities: Skin care regimen initiated : 12/20/2019 Topical wound management initiated : 12/20/2019 Notes: Electronic Signature(s) Signed: 09/01/2020 5:09:36 PM By: [name] RN, BSN Entered By: [name] on 09/01/2020 08:37:53 -------------------------------------------------------------------------------- Pain Assessment Details Patient Name: Date of Service: Tracy Sermon. 09/01/2020 8:30 A M Medical Record Number: [id number] Patient Account Number:  [id number] Date of Birth/Sex: Treating RN: 30-Mar-1933 (86 y.o. [name] Primary Care Taheem Fricke: Lajean Manes T Other Clinician: Referring Hudsyn Champine: Treating Yazlin Ekblad/Extender: Evelena Peat in Treatment: 36 Active Problems Location of Pain Severity and Description of Pain Patient Has Paino Yes Site Locations With Dressing Change: Yes Duration of the Pain. Constant / Intermittento Intermittent How Long Does it Lasto Hours: Minutes: 15 Rate the pain. Current Pain Level: 4 Worst Pain Level: 6 Least Pain Level: 0 Tolerable Pain Level: 5 Character of Pain Describe the Pain: Aching, Burning Pain Management and Medication Current Pain Management: Medication: Yes Cold Application: No Rest: Yes Massage: No Activity: No T.E.N.S.: No Heat Application: No Leg drop or elevation: No Is the Current Pain Management Adequate: Inadequate How does your wound impact your activities of daily livingo Sleep: No Bathing: No Appetite: No Relationship With Others: No Bladder Continence: No Emotions: No Bowel Continence: No Work: No Toileting: No Drive: No Dressing: No Hobbies: No Electronic Signature(s) Signed: 09/01/2020 1:15:48 PM By: Carlene Coria RN Entered By: Carlene Coria on 09/01/2020 08:40:53 -------------------------------------------------------------------------------- Patient/Caregiver Education Details Patient Name: Date of Service: Heppler, [name] 9/20/2021andnbsp8:30 A M Medical Record Number: [id number] Patient Account Number: [id number] Date of Birth/Gender: Treating RN: 1933-01-14 (86 y.o. [name], [name]  Primary Care Physician: Lajean Manes T Other Clinician: Referring Physician: Treating Physician/Extender: Evelena Peat in Treatment: 71 Education Assessment Education Provided To: Patient Education Topics Provided Wound/Skin Impairment: Methods: Explain/Verbal Responses: State content  correctly [employer]) Signed: 09/01/2020 5:09:36 PM By: [name] RN, BSN Entered By: [name] on 09/01/2020 08:38:02 -------------------------------------------------------------------------------- Wound Assessment Details Patient Name: Date of Service: Tracy Sermon. 09/01/2020 8:30 A M Medical Record Number: [id number] Patient Account Number: [id number] Date of Birth/Sex: Treating RN: Jun 27, 1933 (86 y.o. [name] Primary Care Jamis Kryder: Lajean Manes T Other Clinician: Referring Era Parr: Treating Orazio Weller/Extender: [name] in Treatment: 36 Wound Status Wound Number: 5 Primary Venous Leg Ulcer Etiology: Etiology: Wound Location: Left, Lateral Lower Leg Wound Open Wounding Event: Gradually Appeared Status: Date Acquired: 03/21/2020 Comorbid Cataracts, Anemia, Arrhythmia, Congestive Heart Failure, Weeks Of Treatment: 23 History: Hypertension, Peripheral Venous Disease, Received Radiation Clustered Wound: No Wound Measurements Length: (cm) 7 Width: (cm) 2.5 Depth: (cm) 0.2 Area: (cm) 13.744 Volume: (cm) 2.749 % Reduction in Area: -2632.4% % Reduction in Volume: -5398% Epithelialization: Small (1-33%) Tunneling: No Undermining: No Wound Description Classification: Full Thickness Without Exposed Support Structures Wound Margin: Distinct, outline attached Exudate Amount: Medium Exudate Type: Serosanguineous Exudate Color: red, brown Foul Odor After Cleansing: No Slough/Fibrino Yes Wound Bed Granulation Amount: Medium (34-66%) Exposed Structure Granulation Quality: Red, Pink Fascia Exposed: No Necrotic Amount: Medium (34-66%) Fat Layer (Subcutaneous Tissue) Exposed: Yes Necrotic Quality: Adherent Slough Tendon Exposed: No Muscle Exposed: No Joint Exposed: No Bone Exposed: No Treatment Notes Wound #5 (Left, Lateral Lower Leg) 1. Cleanse With Wound Cleanser Soap and water 2. Periwound  Care Barrier cream Moisturizing lotion TCA Cream 3. Primary Dressing Applied Calcium Alginate Ag 4. Secondary Dressing ABD Pad Dry Gauze 6. Support Layer Applied 3 layer compression wrap Notes netting. Electronic Signature(s) Signed: 09/01/2020 1:15:48 PM By: Carlene Coria RN Entered By: Carlene Coria on 09/01/2020 08:42:19 -------------------------------------------------------------------------------- Vitals Details Patient Name: Date of Service: [name], [place]. 09/01/2020 8:30 A M Medical Record Number: [id number] Patient Account Number: [id number] Date of Birth/Sex: Treating RN: September 24, 1933 (86 y.o. [name] Primary Care Donalda Job: Lajean Manes T Other Clinician: Referring Rini Moffit: Treating Mykal Batiz/Extender: Evelena Peat in Treatment: 36 Vital Signs Time Taken: 08:37 Temperature (F): 97.8 Height (in): 69 Pulse (bpm): 77 Weight (lbs): 163 Respiratory Rate (breaths/min): 18 Body Mass Index (BMI): 24.1 Blood Pressure (mmHg): 126/70 Reference Range: 80 - 120 mg / dl Electronic Signature(s) Signed: 09/01/2020 1:15:48 PM By: Carlene Coria RN Entered By: Carlene Coria on 09/01/2020 63:78:58

## 2020-09-02 NOTE — Telephone Encounter (Signed)
Yes, propafenone has a rate of nausea of about 11%

## 2020-09-03 NOTE — Telephone Encounter (Signed)
Spoke with Pt and husband.  Pt states she did not have nausea today.  We discussed that Dr. Lovena Le advises if Pt having nausea to hold propafenone and if nausea improves will consider Multaq.  If nausea does not improve will know propafenone is not the culprit and Pt can continue.  Some discussion on what to do since Pt denies nausea today.  Family understands Dr.Taylor's direction.  They will monitor for continued nausea but continue propafenone at this time.  This nurse will check in with family next Tuesday for update.  They will call office if they have any needs before Tuesday.

## 2020-09-08 ENCOUNTER — Encounter (HOSPITAL_BASED_OUTPATIENT_CLINIC_OR_DEPARTMENT_OTHER): Payer: Medicare Other | Admitting: Internal Medicine

## 2020-09-08 DIAGNOSIS — I87321 Chronic venous hypertension (idiopathic) with inflammation of right lower extremity: Secondary | ICD-10-CM | POA: Diagnosis not present

## 2020-09-09 MED ORDER — MULTAQ 400 MG PO TABS: 400.0000 mg | ORAL_TABLET | Freq: Two times a day (BID) | ORAL | 11 refills | Status: DC

## 2020-09-09 NOTE — Progress Notes (Signed)
[name] ([id number]) Visit Report for 09/08/2020 Debridement Details Patient Name: Date of Service: [place], [name] 09/08/2020 8:30 A M Medical Record Number: [id number] Patient Account Number: [id number] Date of Birth/Sex: Treating RN: [birth year] (86 y.o. [name] Primary Care Provider: Lajean Manes T Other Clinician: Referring Provider: Treating Provider/Extender: Evelena Peat in Treatment: 37 Debridement Performed for Assessment: Wound #5 Left,Lateral Lower Leg Performed By: Physician Ricard Dillon., MD Debridement Type: Debridement Severity of Tissue Pre Debridement: Fat layer exposed Level of Consciousness (Pre-procedure): Awake and Alert Pre-procedure Verification/Time Out Yes - 08:50 Taken: Start Time: 08:50 T Area Debrided (L x W): otal 4 (cm) x 1.1 (cm) = 4.4 (cm) Tissue and other material debrided: Non-Viable, Callus, Skin: Epidermis Level: Skin/Epidermis Debridement Description: Selective/Open Wound Instrument: Curette Bleeding: None End Time: 08:52 Procedural Pain: 3 Post Procedural Pain: 0 Response to Treatment: Procedure was tolerated well Level of Consciousness (Post- Awake and Alert procedure): Post Debridement Measurements of Total Wound Length: (cm) 4 Width: (cm) 1.1 Depth: (cm) 0.2 Volume: (cm) 0.691 Character of Wound/Ulcer Post Debridement: Improved Severity of Tissue Post Debridement: Fat layer exposed Post Procedure Diagnosis Same as Pre-procedure Electronic Signature(s) Signed: 09/08/2020 5:31:38 PM By: [name] RN, BSN Signed: 09/09/2020 7:37:15 AM By: Linton Ham MD Entered By: Linton Ham on 09/08/2020 09:02:54 -------------------------------------------------------------------------------- HPI Details Patient Name: Date of Service: Tracy Banker J. 09/08/2020 8:30 A M Medical Record Number: [id number] Patient Account Number: [id number] Date of Birth/Sex: Treating RN: May 10, 1933  (86 y.o. [name] Primary Care Provider: Patrina Levering Other Clinician: Referring Provider: Treating Provider/Extender: Evelena Peat in Treatment: 37 History of Present Illness HPI Description: ADMISSION 12/20/2019 This is an 86 year old woman referred by her primary physician Dr. Felipa Eth for review of a wound on her right lateral lower leg. She was actually in this clinic on 2 separate occasions in 2010 and 2012 cared for by Dr. Sherilyn Cooter. At that point in time she had wounds on her right leg as well. She tells [place] to 1 month ago she noticed a scab building up on her right lateral lower leg this opened into a wound. There was no overt cause of this no trauma, no infection that she is aware of. She has a history of chronic venous insufficiency and wears compression stockings fairly religiously indeed she is done well over the last 8 years since she was last in this clinic. She has been applying Vaseline on this and a covering phone. This is not progressing towards healing. Past medical history; interstitial lung disease, chronic atrial fibrillation status post pacemaker, osteoarthritis of the left knee, left breast CA, chronic repeat venous insufficiency. She takes Eliquis for her atrial fibrillation stroke prophylaxis. ABI in our clinic was 0.99 on the right 1/15; superficial wound on the right lateral calf in the setting of severe acute skin changes from chronic venous insufficiency and lymphedema. We used Iodoflex last week she complained of a lot of pain 1/22; this is a small but difficult wound on the right lateral calf in the setting of severe chronic venous insufficiency and secondary lymphedema. She continues to state the wounds things hurts when she is up on it but seems to be relieved by putting her leg up. I changed her to [facility] last week because the Iodoflex seem to be causing stinging. 1/29. This wound appears to be contracting somewhat.  Changes of chronic venous insufficiency with secondary lymphedema 2/12; not as good  as surface today and slightly bigger. I had to change her from Iodoflex to [facility] because of the stinging pain although she does not think it was any better on the [facility]. She comes into clinic today with a area on the medial left great toe. She said she noticed blood on her sock last Saturday. She had some form of bunion surgery by Dr. Cristie Hem her podiatrist sometime in 2019 she said he "shave the area". I could not find his operative report although I did see reference to the bunion in the area. 2/19; somewhat improved wound on the right lateral lower leg however the wound she came in on the bunion of her left great toe is actually I think larger still somewhat inflamed. We have been using Iodoflex 2/26; not much improvement in either wound area which is the original venous wound on the right lateral lower leg and in the area on the bunion of her left great toe. This still looks somewhat inflamed and tender. We have been using Iodoflex with open much improvement. 3/4; in general both her wounds look better this includes the original venous insufficiency wound on the right lateral lower leg and the area on her tip of her bunion on the left great toe medial aspect of the MTP. Change to Hydrofera Blue last week 3/12; the patient still has a small geographic shaped wound on the right lateral lower leg. We have been using Hydrofera Blue but I changed her to endoform today. She also has the area in the tip of the bunion of the left great toe. We will try Hydrofera Blue here as well 3/19; the small geographic wound on the right lateral lower leg has not filled in. There is some surrounding induration we have noticed this previously. This may be venous inflammation but I wonder about biopsying this if this is not closing up. The area over the left medial first MTP [bunion deformity] requires  debridement. This is not closing in. I am not convinced she is offloading adequately 3/26; right lateral lower leg in the setting of severe venous insufficiency and also an area over the first MTP bunion deformity. No debridement is required. We have been using endoform 4/9; right lateral lower leg wound in the setting of severe venous insufficiency and also a refractory area over the first MTP bunion deformity. The area on the right lateral lower leg is just about closed there is still a minor open area here. She comes in today with a mirror image area on the left lateral lower leg. She says this started as a scab 2 weeks ago and is gradually morphed into an open wound very similar to what she has on the right leg. She has severe bilateral venous insufficiency with venous hypertension obvious from clinical exam. 4/16; her original wound the right lateral leg wound in the setting of chronic venous hypertension is a small open area but very small. We have been using endoform. Her wound over the left first MTP bunion deformity also appears to be small and closing in. Unfortunately she has a large relative area on the left lateral calf which was new last week. Very adherent debris over this we have been using Iodoflex however that may be contributing to the debris on the wound surface 4/23; the original wound is closed and the area on the left first metatarsal bunion deformity is also almost closed the new area from last week required debridement. She went for her reflux studies  that did not take her lower extremity wraps off. This is not helpful. She did have significant reflux in the right common femoral vein. I am not going to press this issue further. She clinically has severe venous hypertension 4/29; the original wound is closed on the right lateral lower calf. The area on the left first metatarsal/bunion deformity has a small slitlike opening that is still not closed. The real problem here is now  wound on the left lateral calf which is necrotic and deep. We used Iodoflex on this wound last time. Silver alginate on the left first toe The patient has [place] wraps. We should be able to transition the right leg into compression stockings and were doing this today. 5/7; the original wound remains closed on the right lateral calf. The left first metatarsal head still is open. Deterioration in the wound which is the new wound from several weeks ago on the left lateral calf. The patient is not aware how this could have happened. We have been using Sorbact starting last week under 3 layer compression 5/14; the right lateral calf is closed the area on the first met head on the left is closed However the new area from 3 weeks ago on the left lateral calf continues to expand. There is raised nodular tissue around the wound necrotic debris on the surface. Circumference of the wound also looks necrotic. It looks as though there is involvement of the tissue under the skin around the large area of this wound which looks threatened. She is complaining of pain. Culture of this wound I did last week showed rare coag negative staph variant I have never heard of although I am doubtful this has clinical significance 05/09/20-Patient returns with a left lateral calf wound looking about the same, the biopsies reviewed show no atypical features and consistent with trauma or pressure injury or stasis change, patient has appointment to be scheduled with vascular We are using silver alginate and 3 layer compression Patient was started on Trental by dermatology which she has been taking for a week now 6/4; left lateral calf wound. I reviewed the dermatology notes with Dr. Martin Majestic from [facility] dermatology. 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 Put her on pentoxifylline noteworthy that she is on Eliquis as well. We have  been using silver alginate under compression. She has an appointment for repeat vascular studies on 7/6. This gets back to the fact that they did not take the wrap off on the original studies that were done. I do not believe she has an arterial issue. 6/10; left lateral calf wound. I put her on Hydrofera Blue last week. Some improvement in the surface condition of the wound. She has repeat vascular studies/reflux studies on 7/6. We are trying to get Apligraf through [place] [employer]. Her husband states he looked on the website this is not going to be approved. I am really not sure if this is a "class effect" 6/18; somewhat surprisingly Apligraf was approved and 100% covered. Her husband was also quite shocked by this. Nevertheless we have her repeat vascular studies on 7/6 and I will not be able to apply this until this is done. We are using Hydrofera Blue to the wound on the left. Her original wound on the right lower leg has maintain closure using compression stocking 6/25; repeat vascular studies/venous on 7/6; we will apply the Apligraf I think that week. We are also going  to work around a week's vacation they have the first week in August. 7/2; reflux studies on 7/6;. Likely place first Apligraf from the major wound area on 7/9; 7/9; patient was kindly seen by Dr. Donnetta Hutching of vein and vascular to review her circulation status with the refractory wounds in the left leg. At first wounds in this clinic were on the right leg and they heal she now has a juxta lite stocking. Dr. Donnetta Hutching thought she had venous stasis disease with venous ulcerations. He did not feel that there was anything that would suggest to expect approval with a blush ablation of her saphenous vein the saphenous veins were felt to be relatively small in caliber. She was not felt to have an arterial issue Apligraf #1 apply today in the standard fashion 7/23; the patient came in today complaining of a lot of pain in the left lower leg  where the wound is. We applied Apligraf 2 weeks ago I also increased her compression from 3-4 layer wondering about the venous hypertension. She arrives today with her original wound that we have been treating with Apligraf looking quite healthy although there is some surface slough. Problematically she has de-epithelialized and developed erythema below the wound towards the lateral malleolus itself. This is somewhat angry. I think this is venous stasis loss of epithelialization. I do not believe this is infection . Apligraf #2 applied to the original wound area 8/9-Patient returns at 2 weeks for Apligraf #3, the left leg venous leg ulcer area had more slough this time 8/23 Apligraf #4 her wound areas are small however she is still complaining of pain. She has previously been seen by vein and vascular. She has not felt to have an arterial issue she was felt to have venous stasis disease. She originally came here with a wound on her right leg on the right lateral calf this healed out. She then developed a difficult area on the left. She saw dermatology wondered whether this could be a vasculopathy. I am not sure if she is still on pentoxifylline the dermatology suggested I will have to clarify this with 9/7; patient complains of unrelenting pain making her life miserable. I been using Apligraf to close her major wound down and the major wound has come down to 2 open areas which are small and granulated. However there is erythema both inferiorly and superiorly cratered thick skin with wound issues between these areas extending into the lateral calf. There is erythema here and tenderness. I have been managing this largely as this this was severe stasis dermatitis. Vein and vascular did not feel that she would benefit from an ablation. She went to see [facility] dermatology some months ago who wondered about a vasculopathy and suggested a biopsy but I am reluctant to go forward with that here. The patient  is on Eliquis. 9/13; the patient saw Dr. Ubaldo Glassing on 9/8; she noted superficial cutaneous breakdown located on her left inferior lateral posterior tibia. She does not have a specific diagnosis for the patient's ulcer. Wanted to rule out squamous cell carcinoma and pyoderma gangrenosum she did a punch biopsy that is still bleeding. They came back for a dressing change after that procedure. They have still not heard the results of the biopsy. Was still using silver alginate as the primary dressing. She is tolerating the doxycycline although she had to take it with food 9/20; the biopsy that was done by dermatology/Dr. Ubaldo Glassing on 9/8 showed findings consistent with stasis dermatitis. I think we  can put any thought of an alternative diagnosis to rest here. She is already seen vein and vascular today did not think she was a candidate for ablations. They felt she had adequate arterial flow. We have been using silver alginate and TCA I put her on doxycycline last time because of erythema around the wound which very well could be venous inflammation/stasis dermatitis but I had some concerns about cellulitis. Doxycycline being a good drug for this because of its anti-inflammatory as well as its antibiotic effects. We also received a note from the patient's cardiologist Dr. Darene Lamer aylor about interactions between doxycycline and propafenone. While I was aware of the potential interaction between propafenone and quinolones I was not aware of any interaction between doxycycline and propafenone. Nor can I find this interaction described. Nevertheless I will follow Dr. Tanna Furry expertise in this area. She was having nausea in any case and was about to stop it even without cardiology advice. She was also in the ER because of bleeding from the biopsy site and in urgent care because of nausea 9/27 weekly follow-up. Area on the left lateral calf venous insufficiency wounds with at one point severe stasis dermatitis. She is  actually doing quite nicely. She has 4 or 5 small open areas. Some of them with eschared surfaces which I removed with a #5 curette. The skin around here looks excellent. Using silver alginate Liberal TCA under compression Electronic Signature(s) Signed: 09/09/2020 7:37:15 AM By: Linton Ham MD Entered By: Linton Ham on 09/08/2020 09:04:29 -------------------------------------------------------------------------------- Physical Exam Details Patient Name: Date of Service: Tracy Sermon. 09/08/2020 8:30 A M Medical Record Number: [id number] Patient Account Number: [id number] Date of Birth/Sex: Treating RN: 03-Feb-1933 (86 y.o. [name] Primary Care Provider: Patrina Levering Other Clinician: Referring Provider: Treating Provider/Extender: [name] in Treatment: 37 Constitutional Sitting or standing Blood Pressure is within target range for patient.. Pulse regular and within target range for patient.Marland Kitchen Respirations regular, non-labored and within target range.. Temperature is normal and within the target range for the patient.Marland Kitchen Appears in no distress. Notes Wound exam; left lateral ankle and calf. Several small open areas scattered on the side of her leg. Several required debridement of eschared surfaces and dry flaking skin I used a #5 curette there was no bleeding. The skin around these wounds looks a lot less angry Electronic Signature(s) Signed: 09/09/2020 7:37:15 AM By: Linton Ham MD Entered By: Linton Ham on 09/08/2020 09:05:22 -------------------------------------------------------------------------------- Physician Orders Details Patient Name: Date of Service: Tracy Sermon. 09/08/2020 8:30 A M Medical Record Number: [id number] Patient Account Number: [id number] Date of Birth/Sex: Treating RN: 12-31-1932 (86 y.o. [name] Primary Care Provider: Lajean Manes T Other Clinician: Referring Provider: Treating  Provider/Extender: Evelena Peat in Treatment: 37 Verbal / Phone Orders: No Diagnosis Coding ICD-10 Coding Code Description I87.321 Chronic venous hypertension (idiopathic) with inflammation of right lower extremity L97.828 Non-pressure chronic ulcer of other part of left lower leg with other specified severity I87.322 Chronic venous hypertension (idiopathic) with inflammation of left lower extremity L97.522 Non-pressure chronic ulcer of other part of left foot with fat layer exposed Follow-up Appointments Return Appointment in 1 week. Dressing Change Frequency Wound #5 Left,Lateral Lower Leg Do not change entire dressing for one week. Skin Barriers/Peri-Wound Care Moisturizing lotion TCA Cream or Ointment - to red/inflamed areas Wound Cleansing May shower with protection. - use cast protector Primary Wound Dressing Wound #5 Left,Lateral Lower Leg Calcium Alginate  with Silver Secondary Dressing Wound #5 Left,Lateral Lower Leg Dry Gauze ABD pad Edema Control 3 Layer Compression System - Left Lower Extremity Avoid standing for long periods of time Elevate legs to the level of the heart or above for 30 minutes daily and/or when sitting, a frequency of: - throughout the day. Exercise regularly Support Garment 20-30 mm/Hg pressure to: - patient to apply farrow wrap 4000 to right leg. Apply in the morning and remove at night. Off-Loading Open toe surgical shoe to: - left foot Electronic Signature(s) Signed: 09/08/2020 5:31:38 PM By: [name] RN, BSN Signed: 09/09/2020 7:37:15 AM By: Linton Ham MD Entered By: [name] on 09/08/2020 08:54:44 -------------------------------------------------------------------------------- Problem List Details Patient Name: Date of Service: Tracy Sermon. 09/08/2020 8:30 A M Medical Record Number: [id number] Patient Account Number: [id number] Date of Birth/Sex: Treating RN: 1933/09/29 (86 y.o. [name] Primary Care Provider: Lajean Manes T Other Clinician: Referring Provider: Treating Provider/Extender: Evelena Peat in Treatment: 37 Active Problems ICD-10 Encounter Code Description Active Date MDM Diagnosis I87.321 Chronic venous hypertension (idiopathic) with inflammation of right lower 12/20/2019 No Yes extremity L97.828 Non-pressure chronic ulcer of other part of left lower leg with other specified 03/21/2020 No Yes severity I87.322 Chronic venous hypertension (idiopathic) with inflammation of left lower 02/14/2020 No Yes extremity L97.522 Non-pressure chronic ulcer of other part of left foot with fat layer exposed 01/25/2020 No Yes Inactive Problems ICD-10 Code Description Active Date Inactive Date L97.812 Non-pressure chronic ulcer of other part of right lower leg with fat layer exposed 12/20/2019 12/20/2019 Resolved Problems Electronic Signature(s) Signed: 09/09/2020 7:37:15 AM By: Linton Ham MD Entered By: Linton Ham on 09/08/2020 09:02:26 -------------------------------------------------------------------------------- Progress Note Details Patient Name: Date of Service: Tracy Sermon. 09/08/2020 8:30 A M Medical Record Number: [id number] Patient Account Number: [id number] Date of Birth/Sex: Treating RN: Mar 11, 1933 (86 y.o. [name] Primary Care Provider: Patrina Levering Other Clinician: Referring Provider: Treating Provider/Extender: Evelena Peat in Treatment: 37 Subjective History of Present Illness (HPI) ADMISSION 12/20/2019 This is an 86 year old woman referred by her primary physician Dr. Felipa Eth for review of a wound on her right lateral lower leg. She was actually in this clinic on 2 separate occasions in 2010 and 2012 cared for by Dr. Sherilyn Cooter. At that point in time she had wounds on her right leg as well. She tells [place] to 1 month ago she noticed a scab building up on her right lateral  lower leg this opened into a wound. There was no overt cause of this no trauma, no infection that she is aware of. She has a history of chronic venous insufficiency and wears compression stockings fairly religiously indeed she is done well over the last 8 years since she was last in this clinic. She has been applying Vaseline on this and a covering phone. This is not progressing towards healing. Past medical history; interstitial lung disease, chronic atrial fibrillation status post pacemaker, osteoarthritis of the left knee, left breast CA, chronic repeat venous insufficiency. She takes Eliquis for her atrial fibrillation stroke prophylaxis. ABI in our clinic was 0.99 on the right 1/15; superficial wound on the right lateral calf in the setting of severe acute skin changes from chronic venous insufficiency and lymphedema. We used Iodoflex last week she complained of a lot of pain 1/22; this is a small but difficult wound on the right lateral calf in the setting of severe chronic venous insufficiency and secondary  lymphedema. She continues to state the wounds things hurts when she is up on it but seems to be relieved by putting her leg up. I changed her to [facility] last week because the Iodoflex seem to be causing stinging. 1/29. This wound appears to be contracting somewhat. Changes of chronic venous insufficiency with secondary lymphedema 2/12; not as good as surface today and slightly bigger. I had to change her from Iodoflex to [facility] because of the stinging pain although she does not think it was any better on the [facility]. ooShe comes into clinic today with a area on the medial left great toe. She said she noticed blood on her sock last Saturday. She had some form of bunion surgery by Dr. Ila Mcgill her podiatrist sometime in 2019 she said he "shave the area". I could not find his operative report although I did see reference to the bunion in the area. 2/19; somewhat  improved wound on the right lateral lower leg however the wound she came in on the bunion of her left great toe is actually I think larger still somewhat inflamed. We have been using Iodoflex 2/26; not much improvement in either wound area which is the original venous wound on the right lateral lower leg and in the area on the bunion of her left great toe. This still looks somewhat inflamed and tender. We have been using Iodoflex with open much improvement. 3/4; in general both her wounds look better this includes the original venous insufficiency wound on the right lateral lower leg and the area on her tip of her bunion on the left great toe medial aspect of the MTP. Change to Hydrofera Blue last week 3/12; the patient still has a small geographic shaped wound on the right lateral lower leg. We have been using Hydrofera Blue but I changed her to endoform today. She also has the area in the tip of the bunion of the left great toe. We will try Hydrofera Blue here as well 3/19; the small geographic wound on the right lateral lower leg has not filled in. There is some surrounding induration we have noticed this previously. This may be venous inflammation but I wonder about biopsying this if this is not closing up. The area over the left medial first MTP [bunion deformity] requires debridement. This is not closing in. I am not convinced she is offloading adequately 3/26; right lateral lower leg in the setting of severe venous insufficiency and also an area over the first MTP bunion deformity. No debridement is required. We have been using endoform 4/9; right lateral lower leg wound in the setting of severe venous insufficiency and also a refractory area over the first MTP bunion deformity. The area on the right lateral lower leg is just about closed there is still a minor open area here. She comes in today with a mirror image area on the left lateral lower leg. She says this started as a scab 2 weeks ago  and is gradually morphed into an open wound very similar to what she has on the right leg. She has severe bilateral venous insufficiency with venous hypertension obvious from clinical exam. 4/16; her original wound the right lateral leg wound in the setting of chronic venous hypertension is a small open area but very small. We have been using endoform. Her wound over the left first MTP bunion deformity also appears to be small and closing in. Unfortunately she has a large relative area on the left lateral  calf which was new last week. Very adherent debris over this we have been using Iodoflex however that may be contributing to the debris on the wound surface 4/23; the original wound is closed and the area on the left first metatarsal bunion deformity is also almost closed the new area from last week required debridement. She went for her reflux studies that did not take her lower extremity wraps off. This is not helpful. She did have significant reflux in the right common femoral vein. I am not going to press this issue further. She clinically has severe venous hypertension 4/29; the original wound is closed on the right lateral lower calf. The area on the left first metatarsal/bunion deformity has a small slitlike opening that is still not closed. The real problem here is now wound on the left lateral calf which is necrotic and deep. We used Iodoflex on this wound last time. Silver alginate on the left first toe The patient has [place] wraps. We should be able to transition the right leg into compression stockings and were doing this today. 5/7; the original wound remains closed on the right lateral calf. The left first metatarsal head still is open. Deterioration in the wound which is the new wound from several weeks ago on the left lateral calf. The patient is not aware how this could have happened. We have been using Sorbact starting last week under 3 layer compression 5/14; the right lateral  calf is closed the area on the first met head on the left is closed However the new area from 3 weeks ago on the left lateral calf continues to expand. There is raised nodular tissue around the wound necrotic debris on the surface. Circumference of the wound also looks necrotic. It looks as though there is involvement of the tissue under the skin around the large area of this wound which looks threatened. She is complaining of pain. Culture of this wound I did last week showed rare coag negative staph variant I have never heard of although I am doubtful this has clinical significance 05/09/20-Patient returns with a left lateral calf wound looking about the same, the biopsies reviewed show no atypical features and consistent with trauma or pressure injury or stasis change, patient has appointment to be scheduled with vascular We are using silver alginate and 3 layer compression Patient was started on Trental by dermatology which she has been taking for a week now 6/4; left lateral calf wound. I reviewed the dermatology notes with Dr. Martin Majestic from [facility] dermatology. 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 Put her on pentoxifylline noteworthy that she is on Eliquis as well. We have been using silver alginate under compression. She has an appointment for repeat vascular studies on 7/6. This gets back to the fact that they did not take the wrap off on the original studies that were done. I do not believe she has an arterial issue. 6/10; left lateral calf wound. I put her on Hydrofera Blue last week. Some improvement in the surface condition of the wound. She has repeat vascular studies/reflux studies on 7/6. We are trying to get Apligraf through [place] [employer]. Her husband states he looked on the website this is not going to be approved. I am really not sure if this is a "class effect" 6/18; somewhat  surprisingly Apligraf was approved and 100% covered. Her husband was also quite shocked by this. Nevertheless we have her  repeat vascular studies on 7/6 and I will not be able to apply this until this is done. We are using Hydrofera Blue to the wound on the left. Her original wound on the right lower leg has maintain closure using compression stocking 6/25; repeat vascular studies/venous on 7/6; we will apply the Apligraf I think that week. We are also going to work around a week's vacation they have the first week in August. 7/2; reflux studies on 7/6;. Likely place first Apligraf from the major wound area on 7/9; 7/9; patient was kindly seen by Dr. Donnetta Hutching of vein and vascular to review her circulation status with the refractory wounds in the left leg. At first wounds in this clinic were on the right leg and they heal she now has a juxta lite stocking. Dr. Donnetta Hutching thought she had venous stasis disease with venous ulcerations. He did not feel that there was anything that would suggest to expect approval with a blush ablation of her saphenous vein the saphenous veins were felt to be relatively small in caliber. She was not felt to have an arterial issue Apligraf #1 apply today in the standard fashion 7/23; the patient came in today complaining of a lot of pain in the left lower leg where the wound is. We applied Apligraf 2 weeks ago I also increased her compression from 3-4 layer wondering about the venous hypertension. She arrives today with her original wound that we have been treating with Apligraf looking quite healthy although there is some surface slough. Problematically she has de-epithelialized and developed erythema below the wound towards the lateral malleolus itself. This is somewhat angry. I think this is venous stasis loss of epithelialization. I do not believe this is infection . Apligraf #2 applied to the original wound area 8/9-Patient returns at 2 weeks for Apligraf #3, the left leg  venous leg ulcer area had more slough this time 8/23 Apligraf #4 her wound areas are small however she is still complaining of pain. She has previously been seen by vein and vascular. She has not felt to have an arterial issue she was felt to have venous stasis disease. She originally came here with a wound on her right leg on the right lateral calf this healed out. She then developed a difficult area on the left. She saw dermatology wondered whether this could be a vasculopathy. I am not sure if she is still on pentoxifylline the dermatology suggested I will have to clarify this with 9/7; patient complains of unrelenting pain making her life miserable. I been using Apligraf to close her major wound down and the major wound has come down to 2 open areas which are small and granulated. However there is erythema both inferiorly and superiorly cratered thick skin with wound issues between these areas extending into the lateral calf. There is erythema here and tenderness. I have been managing this largely as this this was severe stasis dermatitis. Vein and vascular did not feel that she would benefit from an ablation. She went to see [facility] dermatology some months ago who wondered about a vasculopathy and suggested a biopsy but I am reluctant to go forward with that here. The patient is on Eliquis. 9/13; the patient saw Dr. Ubaldo Glassing on 9/8; she noted superficial cutaneous breakdown located on her left inferior lateral posterior tibia. She does not have a specific diagnosis for the patient's ulcer. Wanted to rule out squamous cell carcinoma and pyoderma gangrenosum she did a punch biopsy that is still bleeding. They  came back for a dressing change after that procedure. They have still not heard the results of the biopsy. Was still using silver alginate as the primary dressing. She is tolerating the doxycycline although she had to take it with food 9/20; the biopsy that was done by dermatology/Dr. Nicholas Lose on  9/8 showed findings consistent with stasis dermatitis. I think we can put any thought of an alternative diagnosis to rest here. She is already seen vein and vascular today did not think she was a candidate for ablations. They felt she had adequate arterial flow. We have been using silver alginate and TCA I put her on doxycycline last time because of erythema around the wound which very well could be venous inflammation/stasis dermatitis but I had some concerns about cellulitis. Doxycycline being a good drug for this because of its anti-inflammatory as well as its antibiotic effects. We also received a note from the patient's cardiologist Dr. Karie Schwalbe aylor about interactions between doxycycline and propafenone. While I was aware of the potential interaction between propafenone and quinolones I was not aware of any interaction between doxycycline and propafenone. Nor can I find this interaction described. Nevertheless I will follow Dr. Lubertha Basque expertise in this area. She was having nausea in any case and was about to stop it even without cardiology advice. She was also in the ER because of bleeding from the biopsy site and in urgent care because of nausea 9/27 weekly follow-up. Area on the left lateral calf venous insufficiency wounds with at one point severe stasis dermatitis. She is actually doing quite nicely. She has 4 or 5 small open areas. Some of them with eschared surfaces which I removed with a #5 curette. The skin around here looks excellent. Using silver alginate Liberal TCA under compression Objective Constitutional Sitting or standing Blood Pressure is within target range for patient.. Pulse regular and within target range for patient.Marland Kitchen Respirations regular, non-labored and within target range.. Temperature is normal and within the target range for the patient.Marland Kitchen Appears in no distress. Vitals Time Taken: 8:33 AM, Height: 69 in, Weight: 163 lbs, BMI: 24.1, Temperature: 97.7 F, Pulse: 63 bpm,  Respiratory Rate: 18 breaths/min, Blood Pressure: 136/78 mmHg. General Notes: Wound exam; left lateral ankle and calf. Several small open areas scattered on the side of her leg. Several required debridement of eschared surfaces and dry flaking skin I used a #5 curette there was no bleeding. The skin around these wounds looks a lot less angry Integumentary (Hair, Skin) Wound #5 status is Open. Original cause of wound was Gradually Appeared. The wound is located on the Left,Lateral Lower Leg. The wound measures 4cm length x 1.1cm width x 0.2cm depth; 3.456cm^2 area and 0.691cm^3 volume. There is Fat Layer (Subcutaneous Tissue) exposed. There is no tunneling or undermining noted. There is a medium amount of serosanguineous drainage noted. The wound margin is distinct with the outline attached to the wound base. There is medium (34-66%) red, pink granulation within the wound bed. There is a medium (34-66%) amount of necrotic tissue within the wound bed including Adherent Slough. Assessment Active Problems ICD-10 Chronic venous hypertension (idiopathic) with inflammation of right lower extremity Non-pressure chronic ulcer of other part of left lower leg with other specified severity Chronic venous hypertension (idiopathic) with inflammation of left lower extremity Non-pressure chronic ulcer of other part of left foot with fat layer exposed Procedures Wound #5 Pre-procedure diagnosis of Wound #5 is a Venous Leg Ulcer located on the Left,Lateral Lower Leg .Severity of Tissue  Pre Debridement is: Fat layer exposed. There was a Selective/Open Wound Skin/Epidermis Debridement with a total area of 4.4 sq cm performed by Ricard Dillon., MD. With the following instrument(s): Curette to remove Non-Viable tissue/material. Material removed includes Callus and Skin: Epidermis and. No specimens were taken. A time out was conducted at 08:50, prior to the start of the procedure. There was no bleeding. The  procedure was tolerated well with a pain level of 3 throughout and a pain level of 0 following the procedure. Post Debridement Measurements: 4cm length x 1.1cm width x 0.2cm depth; 0.691cm^3 volume. Character of Wound/Ulcer Post Debridement is improved. Severity of Tissue Post Debridement is: Fat layer exposed. Post procedure Diagnosis Wound #5: Same as Pre-Procedure Pre-procedure diagnosis of Wound #5 is a Venous Leg Ulcer located on the Left,Lateral Lower Leg . There was a Three Layer Compression Therapy Procedure by [name], RN. Post procedure Diagnosis Wound #5: Same as Pre-Procedure Plan Follow-up Appointments: Return Appointment in 1 week. Dressing Change Frequency: Wound #5 Left,Lateral Lower Leg: Do not change entire dressing for one week. Skin Barriers/Peri-Wound Care: Moisturizing lotion TCA Cream or Ointment - to red/inflamed areas Wound Cleansing: May shower with protection. - use cast protector Primary Wound Dressing: Wound #5 Left,Lateral Lower Leg: Calcium Alginate with Silver Secondary Dressing: Wound #5 Left,Lateral Lower Leg: Dry Gauze ABD pad Edema Control: 3 Layer Compression System - Left Lower Extremity Avoid standing for long periods of time Elevate legs to the level of the heart or above for 30 minutes daily and/or when sitting, a frequency of: - throughout the day. Exercise regularly Support Garment 20-30 mm/Hg pressure to: - patient to apply farrow wrap 4000 to right leg. Apply in the morning and remove at night. Off-Loading: Open toe surgical shoe to: - left foot 1. We continue with TCA and silver alginate to the still open wound areas 2. Might try Hydrofera Blue or polymen if these areas do not improve 3. This is a patient with severe chronic venous insufficiency and stasis dermatitis. She had this area biopsied by dermatology there was no alternative diagnosis. She is also been to see vein and vascular 4. She has a compression stocking on the  right leg which is her initial wound in this clinic. I believe she has 1 for the left leg in the eventuality that all this closes. 5. I also removed her stitch from the biopsy site Electronic Signature(s) Signed: 09/09/2020 7:37:15 AM By: Linton Ham MD Entered By: Linton Ham on 09/08/2020 09:06:31 -------------------------------------------------------------------------------- SuperBill Details Patient Name: Date of Service: Tracy Sermon 09/08/2020 Medical Record Number: [id number] Patient Account Number: [id number] Date of Birth/Sex: Treating RN: Dec 29, 1932 (86 y.o. [name] Primary Care Provider: Lajean Manes T Other Clinician: Referring Provider: Treating Provider/Extender: Evelena Peat in Treatment: 37 Diagnosis Coding ICD-10 Codes Code Description [phone] Chronic venous hypertension (idiopathic) with inflammation of right lower extremity L97.828 Non-pressure chronic ulcer of other part of left lower leg with other specified severity I87.322 Chronic venous hypertension (idiopathic) with inflammation of left lower extremity L97.522 Non-pressure chronic ulcer of other part of left foot with fat layer exposed Facility Procedures The patient participates with Medicare or their insurance follows the Medicare Facility Guidelines: CPT4 Code Description Modifier Quantity [id number] - DEBRIDE WOUND 1ST 20 SQ CM OR < 1 ICD-10 Diagnosis Description L97.828 Non-pressure chronic ulcer  of other part of left lower leg with other specified severity I87.322 Chronic venous hypertension (idiopathic) with inflammation of  left lower extremity Physician Procedures : CPT4 Code Description Modifier [id number] - WC PHYS DEBR WO ANESTH 20 SQ CM 1 ICD-10 Diagnosis Description L97.828 Non-pressure chronic ulcer of other part of left lower leg with other specified severity I87.322 Chronic venous hypertension  (idiopathic) with inflammation of left lower  extremity Quantity: Electronic Signature(s) Signed: 09/09/2020 7:37:15 AM By: Linton Ham MD Entered By: Linton Ham on 09/08/2020 09:06:49

## 2020-09-09 NOTE — Progress Notes (Signed)
[name], [name] ([id number]) Visit Report for 09/08/2020 Arrival Information Details Patient Name: Date of Service: [place], [name] 09/08/2020 8:30 A M Medical Record Number: [id number] Patient Account Number: [id number] Date of Birth/Sex: Treating RN: 1933/06/09 (86 y.o. [name] Primary Care Kareena Arrambide: Lajean Manes T Other Clinician: Referring Makinsey Pepitone: Treating Evaan Tidwell/Extender: Evelena Peat in Treatment: 21 Visit Information History Since Last Visit Added or deleted any medications: No Patient Arrived: Ambulatory Any new allergies or adverse reactions: No Arrival Time: 08:33 Had a fall or experienced change in No Accompanied By: self activities of daily living that may affect Transfer Assistance: None risk of falls: Patient Identification Verified: Yes Signs or symptoms of abuse/neglect since last visito No Secondary Verification Process Completed: Yes Hospitalized since last visit: No Patient Requires Transmission-Based Precautions: No Implantable device outside of the clinic excluding No Patient Has Alerts: Yes cellular tissue based products placed in the center Patient Alerts: Patient on Blood Thinner since last visit: Right ABI:0.99 Has Dressing in Place as Prescribed: Yes Has Compression in Place as Prescribed: Yes Pain Present Now: Yes Electronic Signature(s) Signed: 09/08/2020 5:24:24 PM By: Kela Millin Entered By: Kela Millin on 09/08/2020 08:33:39 -------------------------------------------------------------------------------- Compression Therapy Details Patient Name: Date of Service: [name]. 09/08/2020 8:30 A M Medical Record Number: [id number] Patient Account Number: [id number] Date of Birth/Sex: Treating RN: 1933-07-17 (86 y.o. [name] Primary Care Kamali Sakata: Patrina Levering Other Clinician: Referring Gabrielle Wakeland: Treating Esli Jernigan/Extender: [name] in  Treatment: 37 Compression Therapy Performed for Wound Assessment: Wound #5 Left,Lateral Lower Leg Performed By: Clinician [name], RN Compression Type: Three Layer Post Procedure Diagnosis Same as Pre-procedure Electronic Signature(s) Signed: 09/08/2020 5:31:38 PM By: [name] RN, BSN Entered By: [name] on 09/08/2020 08:53:42 -------------------------------------------------------------------------------- Encounter Discharge Information Details Patient Name: Date of Service: [name]. 09/08/2020 8:30 A M Medical Record Number: [id number] Patient Account Number: [id number] Date of Birth/Sex: Treating RN: 1933/11/12 (86 y.o. [name] Primary Care Inri Sobieski: Patrina Levering Other Clinician: Referring Jontae Adebayo: Treating Kyllie Pettijohn/Extender: Evelena Peat in Treatment: 37 Encounter Discharge Information Items Post Procedure Vitals Discharge Condition: Stable Temperature (F): 97.7 Ambulatory Status: Ambulatory Pulse (bpm): 63 Discharge Destination: Home Respiratory Rate (breaths/min): 18 Transportation: Private Auto Blood Pressure (mmHg): 136/78 Accompanied By: husband Schedule Follow-up Appointment: Yes Clinical Summary of Care: Patient Declined Electronic Signature(s) Signed: 09/08/2020 5:24:24 PM By: Kela Millin Entered By: Kela Millin on 09/08/2020 09:02:05 -------------------------------------------------------------------------------- Lower Extremity Assessment Details Patient Name: Date of Service: [name]. 09/08/2020 8:30 A M Medical Record Number: [id number] Patient Account Number: [id number] Date of Birth/Sex: Treating RN: 12-08-1933 (86 y.o. [name] Primary Care Delainee Tramel: Lajean Manes T Other Clinician: Referring Jonell Krontz: Treating Jahkeem Kurka/Extender: [name] in Treatment: 37 Edema Assessment Assessed: [Left: No] [Right: No] Edema:  [Left: No] [Right: No] Calf Left: Right: Point of Measurement: 41 cm From Medial Instep 30.5 cm 30 cm Ankle Left: Right: Point of Measurement: 14 cm From Medial Instep 20 cm 20 cm Vascular Assessment Pulses: Dorsalis Pedis Palpable: [Left:Yes] Electronic Signature(s) Signed: 09/08/2020 5:24:24 PM By: Kela Millin Entered By: Kela Millin on 09/08/2020 08:37:11 -------------------------------------------------------------------------------- Multi Wound Chart Details Patient Name: Date of Service: [name]. 09/08/2020 8:30 A M Medical Record Number: [id number] Patient Account Number: [id number] Date of Birth/Sex: Treating RN: 06-Sep-1933 (86 y.o. [name] Primary Care Jennalynn Rivard: Patrina Levering Other Clinician: Referring Vivan Agostino:  Treating Vianca Bracher/Extender: Venita Lick T Weeks in Treatment: 37 Vital Signs Height(in): 69 Pulse(bpm): 63 Weight(lbs): 163 Blood Pressure(mmHg): 136/78 Body Mass Index(BMI): 24 Temperature(F): 97.7 Respiratory Rate(breaths/min): 18 Photos: [5:No Photos Left, Lateral Lower Leg] [N/A:N/A N/A] Wound Location: [5:Gradually Appeared] [N/A:N/A] Wounding Event: [5:Venous Leg Ulcer] [N/A:N/A] Primary Etiology: [5:Cataracts, Anemia, Arrhythmia,] [N/A:N/A] Comorbid History: [5:Congestive Heart Failure, Hypertension, Peripheral Venous Disease, Received Radiation 03/21/2020] [N/A:N/A] Date Acquired: [5:24] [N/A:N/A] Weeks of Treatment: [5:Open] [N/A:N/A] Wound Status: [5:Yes] [N/A:N/A] Clustered Wound: [5:2] [N/A:N/A] Clustered Quantity: [5:4x1.1x0.2] [N/A:N/A] Measurements L x W x D (cm) [5:3.456] [N/A:N/A] A (cm) : rea [5:0.691] [N/A:N/A] Volume (cm) : [5:-587.10%] [N/A:N/A] % Reduction in A [5:rea: -1282.00%] [N/A:N/A] % Reduction in Volume: [5:Full Thickness Without Exposed] [N/A:N/A] Classification: [5:Support Structures Medium] [N/A:N/A] Exudate A mount: [5:Serosanguineous] [N/A:N/A] Exudate Type:  [5:red, brown] [N/A:N/A] Exudate Color: [5:Distinct, outline attached] [N/A:N/A] Wound Margin: [5:Medium (34-66%)] [N/A:N/A] Granulation A mount: [5:Red, Pink] [N/A:N/A] Granulation Quality: [5:Medium (34-66%)] [N/A:N/A] Necrotic A mount: [5:Fat Layer (Subcutaneous Tissue): Yes N/A] Exposed Structures: [5:Fascia: No Tendon: No Muscle: No Joint: No Bone: No Medium (34-66%)] [N/A:N/A] Epithelialization: [5:Debridement - Selective/Open Wound N/A] Debridement: Pre-procedure Verification/Time Out 08:50 [N/A:N/A] Taken: [5:Callus] [N/A:N/A] Tissue Debrided: [5:Skin/Epidermis] [N/A:N/A] Level: [5:4.4] [N/A:N/A] Debridement A (sq cm): [5:rea Curette] [N/A:N/A] Instrument: [5:None] [N/A:N/A] Bleeding: [5:3] [N/A:N/A] Procedural Pain: [5:0] [N/A:N/A] Post Procedural Pain: [5:Procedure was tolerated well] [N/A:N/A] Debridement Treatment Response: [5:4x1.1x0.2] [N/A:N/A] Post Debridement Measurements L x W x D (cm) [5:0.691] [N/A:N/A] Post Debridement Volume: (cm) [5:Compression Therapy] [N/A:N/A] Procedures Performed: [5:Debridement] Treatment Notes Wound #5 (Left, Lateral Lower Leg) 1. Cleanse With Wound Cleanser Soap and water 2. Periwound Care Moisturizing lotion TCA Cream 3. Primary Dressing Applied Calcium Alginate Ag 4. Secondary Dressing ABD Pad 6. Support Layer Applied 3 layer compression wrap Notes netting. Electronic Signature(s) Signed: 09/08/2020 5:31:38 PM By: [name] RN, BSN Signed: 09/09/2020 7:37:15 AM By: Linton Ham MD Entered By: Linton Ham on 09/08/2020 09:02:35 -------------------------------------------------------------------------------- Multi-Disciplinary Care Plan Details Patient Name: Date of Service: [place], [name] 09/08/2020 8:30 A M Medical Record Number: [id number] Patient Account Number: [id number] Date of Birth/Sex: Treating RN: 09/02/1933 (86 y.o. [name] Primary Care Graylee Arutyunyan: Patrina Levering Other  Clinician: Referring Bauer Ausborn: Treating Nhu Glasby/Extender: [name] in Treatment: 37 Active Inactive Wound/Skin Impairment Nursing Diagnoses: Knowledge deficit related to ulceration/compromised skin integrity Goals: Patient/caregiver will verbalize understanding of skin care regimen Date Initiated: 12/20/2019 Target Resolution Date: 09/26/2020 Goal Status: Active Interventions: Assess patient/caregiver ability to perform ulcer/skin care regimen upon admission and as needed Provide education on ulcer and skin care Treatment Activities: Skin care regimen initiated : 12/20/2019 Topical wound management initiated : 12/20/2019 Notes: Electronic Signature(s) Signed: 09/08/2020 5:31:38 PM By: [name] RN, BSN Entered By: [name] on 09/08/2020 08:40:25 -------------------------------------------------------------------------------- Pain Assessment Details Patient Name: Date of Service: [name] 09/08/2020 8:30 A M Medical Record Number: [id number] Patient Account Number: [id number] Date of Birth/Sex: Treating RN: 12-11-1933 (86 y.o. [name] Primary Care Farhan Jean: Patrina Levering Other Clinician: Referring Nikyah Lackman: Treating Dianelly Ferran/Extender: Evelena Peat in Treatment: 37 Active Problems Location of Pain Severity and Description of Pain Patient Has Paino No Site Locations Rate the pain. Current Pain Level: 5 Worst Pain Level: 10 Least Pain Level: 4 Tolerable Pain Level: 5 Character of Pain Describe the Pain: Aching, Burning Pain Management and Medication Current Pain Management: Electronic Signature(s) Signed: 09/08/2020 5:24:24 PM By: Kela Millin Entered By: Kela Millin on 09/08/2020  08:40:32 -------------------------------------------------------------------------------- Patient/Caregiver Education Details Patient Name: Date of Service: [place], [name]  9/27/2021andnbsp8:30 A M Medical Record Number: [id number] Patient Account Number: [id number] Date of Birth/Gender: Treating RN: 1933/02/09 (86 y.o. [name] Primary Care Physician: Lajean Manes T Other Clinician: Referring Physician: Treating Physician/Extender: Evelena Peat in Treatment: 56 Education Assessment Education Provided To: Patient Education Topics Provided Wound/Skin Impairment: Methods: Explain/Verbal Responses: State content correctly [employer]) Signed: 09/08/2020 5:31:38 PM By: [name] RN, BSN Entered By: [name] on 09/08/2020 08:40:36 -------------------------------------------------------------------------------- Wound Assessment Details Patient Name: Date of Service: [name]. 09/08/2020 8:30 A M Medical Record Number: [id number] Patient Account Number: [id number] Date of Birth/Sex: Treating RN: [birth year] (86 y.o. [name] Primary Care Eurika Sandy: Lajean Manes T Other Clinician: Referring Ogle Hoeffner: Treating Kyrie Bun/Extender: [name] in Treatment: 37 Wound Status Wound Number: 5 Primary Venous Leg Ulcer Etiology: Wound Location: Left, Lateral Lower Leg Wound Open Wounding Event: Gradually Appeared Status: Date Acquired: 03/21/2020 Comorbid Cataracts, Anemia, Arrhythmia, Congestive Heart Failure, Weeks Of Treatment: 24 History: Hypertension, Peripheral Venous Disease, Received Radiation Clustered Wound: Yes Wound Measurements Length: (cm) 4 Width: (cm) 1.1 Depth: (cm) 0.2 Clustered Quantity: 2 Area: (cm) 3. Volume: (cm) 0. % Reduction in Area: -587.1% % Reduction in Volume: -1282% Epithelialization: Medium (34-66%) Tunneling: No 456 Undermining: No 691 Wound Description Classification: Full Thickness Without Exposed Support Str Wound Margin: Distinct, outline attached Exudate Amount: Medium Exudate Type: Serosanguineous Exudate  Color: red, brown uctures Foul Odor After Cleansing: No Slough/Fibrino Yes Wound Bed Granulation Amount: Medium (34-66%) Exposed Structure Granulation Quality: Red, Pink Fascia Exposed: No Necrotic Amount: Medium (34-66%) Fat Layer (Subcutaneous Tissue) Exposed: Yes Necrotic Quality: Adherent Slough Tendon Exposed: No Muscle Exposed: No Joint Exposed: No Bone Exposed: No Treatment Notes Wound #5 (Left, Lateral Lower Leg) 1. Cleanse With Wound Cleanser Soap and water 2. Periwound Care Moisturizing lotion TCA Cream 3. Primary Dressing Applied Calcium Alginate Ag 4. Secondary Dressing ABD Pad 6. Support Layer Applied 3 layer compression wrap Notes netting. Electronic Signature(s) Signed: 09/08/2020 5:24:24 PM By: Kela Millin Entered By: Kela Millin on 09/08/2020 08:39:42 -------------------------------------------------------------------------------- Vitals Details Patient Name: Date of Service: [name], [place]. 09/08/2020 8:30 A M Medical Record Number: [id number] Patient Account Number: [id number] Date of Birth/Sex: Treating RN: 1933-02-13 (86 y.o. [name] Primary Care Avamarie Crossley: Lajean Manes T Other Clinician: Referring Melida Northington: Treating Osmel Dykstra/Extender: Evelena Peat in Treatment: 37 Vital Signs Time Taken: 08:33 Temperature (F): 97.7 Height (in): 69 Pulse (bpm): 63 Weight (lbs): 163 Respiratory Rate (breaths/min): 18 Body Mass Index (BMI): 24.1 Blood Pressure (mmHg): 136/78 Reference Range: 80 - 120 mg / dl Electronic Signature(s) Signed: 09/08/2020 5:24:24 PM By: Kela Millin Entered By: Kela Millin on 09/08/2020 08:34:17

## 2020-09-09 NOTE — Telephone Encounter (Signed)
Returned call to Pt.  Per Pt she stopped propafenone on Sunday and her nausea has gone away.  Per Dr. Lyda Jester propafenone.  Have Pt start taking dronedarone 400  Mg bid.  Keep f/u as scheduled.  Pt indicates understanding.

## 2020-09-15 ENCOUNTER — Encounter: Payer: Self-pay | Admitting: Internal Medicine

## 2020-09-15 ENCOUNTER — Encounter (HOSPITAL_BASED_OUTPATIENT_CLINIC_OR_DEPARTMENT_OTHER): Payer: Medicare Other | Attending: Internal Medicine | Admitting: Internal Medicine

## 2020-09-15 DIAGNOSIS — L97522 Non-pressure chronic ulcer of other part of left foot with fat layer exposed: Secondary | ICD-10-CM | POA: Diagnosis not present

## 2020-09-15 DIAGNOSIS — L97828 Non-pressure chronic ulcer of other part of left lower leg with other specified severity: Secondary | ICD-10-CM | POA: Insufficient documentation

## 2020-09-15 NOTE — Telephone Encounter (Signed)
Error. See other phone note

## 2020-09-15 NOTE — Progress Notes (Signed)
[name], [name] ([id number]) Visit Report for 09/15/2020 Arrival Information Details Patient Name: Date of Service: [place], [name] 09/15/2020 8:30 A M Medical Record Number: [id number] Patient Account Number: [id number] Date of Birth/Sex: Treating RN: February 19, 1933 (86 y.o. [name] Primary Care Harlin Mazzoni: Lajean Manes T Other Clinician: Referring Demar Shad: Treating Mikaiya Tramble/Extender: Evelena Peat in Treatment: 72 Visit Information History Since Last Visit Added or deleted any medications: No Patient Arrived: Ambulatory Any new allergies or adverse reactions: No Arrival Time: 08:37 Had a fall or experienced change in No Accompanied By: husband activities of daily living that may affect Transfer Assistance: None risk of falls: Patient Identification Verified: Yes Signs or symptoms of abuse/neglect since last visito No Secondary Verification Process Completed: Yes Hospitalized since last visit: No Patient Requires Transmission-Based Precautions: No Implantable device outside of the clinic excluding No Patient Has Alerts: Yes cellular tissue based products placed in the center Patient Alerts: Patient on Blood Thinner since last visit: Right ABI:0.99 Has Dressing in Place as Prescribed: Yes Has Compression in Place as Prescribed: Yes Pain Present Now: No Electronic Signature(s) Signed: 09/15/2020 5:08:20 PM By: Kela Millin Entered By: Kela Millin on 09/15/2020 08:37:34 -------------------------------------------------------------------------------- Compression Therapy Details Patient Name: Date of Service: Tracy Banker J. 09/15/2020 8:30 A M Medical Record Number: [id number] Patient Account Number: [id number] Date of Birth/Sex: Treating RN: 1933/12/06 (86 y.o. [name] Primary Care Micheal Sheen: Lajean Manes T Other Clinician: Referring Rida Loudin: Treating Remy Voiles/Extender: Evelena Peat in  Treatment: 38 Compression Therapy Performed for Wound Assessment: Wound #5 Left,Lateral Lower Leg Performed By: Clinician Deon Pilling, RN Compression Type: Three Layer Post Procedure Diagnosis Same as Pre-procedure Electronic Signature(s) Signed: 09/15/2020 5:03:51 PM By: [name] RN, BSN Entered By: [name] on 09/15/2020 09:06:13 -------------------------------------------------------------------------------- Compression Therapy Details Patient Name: Date of Service: Tracy Sermon. 09/15/2020 8:30 A M Medical Record Number: [id number] Patient Account Number: [id number] Date of Birth/Sex: Treating RN: [birth year] (86 y.o. [name] Primary Care Isobella Ascher: Lajean Manes T Other Clinician: Referring Priscilla Kirstein: Treating Nayana Lenig/Extender: Evelena Peat in Treatment: 38 Compression Therapy Performed for Wound Assessment: Wound #8 Left,Proximal,Lateral Lower Leg Performed By: Clinician Deon Pilling, RN Compression Type: Three Layer Post Procedure Diagnosis Same as Pre-procedure Electronic Signature(s) Signed: 09/15/2020 5:03:51 PM By: [name] RN, BSN Entered By: [name] on 09/15/2020 09:06:13 -------------------------------------------------------------------------------- Encounter Discharge Information Details Patient Name: Date of Service: Tracy Sermon. 09/15/2020 8:30 A M Medical Record Number: [id number] Patient Account Number: [id number] Date of Birth/Sex: Treating RN: 03-28-1933 (86 y.o. [name] Primary Care Laportia Carley: Patrina Levering Other Clinician: Referring Sueo Cullen: Treating Shaili Donalson/Extender: Evelena Peat in Treatment: 34 Encounter Discharge Information Items Post Procedure Vitals Discharge Condition: Stable Temperature (F): 97.5 Ambulatory Status: Ambulatory Pulse (bpm): 43 Discharge Destination: Home Respiratory Rate (breaths/min): 19 Transportation:  Private Auto Blood Pressure (mmHg): 136/75 Accompanied By: husband Schedule Follow-up Appointment: Yes Clinical Summary of Care: Electronic Signature(s) Signed: 09/15/2020 4:56:48 PM By: Deon Pilling Entered By: Deon Pilling on 09/15/2020 09:24:55 -------------------------------------------------------------------------------- Lower Extremity Assessment Details Patient Name: Date of Service: [name], [name] 09/15/2020 8:30 A M Medical Record Number: [id number] Patient Account Number: [id number] Date of Birth/Sex: Treating RN: 1933/08/30 (86 y.o. [name] Primary Care Laquenta Whitsell: Patrina Levering Other Clinician: Referring Dawood Spitler: Treating Karmel Patricelli/Extender: Venita Lick T Weeks in Treatment: 38 Edema Assessment Assessed: [Left: No] [Right: No] Edema: [Left: N] [Right: o]  Calf Left: Right: Point of Measurement: 41 cm From Medial Instep 30 cm Ankle Left: Right: Point of Measurement: 14 cm From Medial Instep 20 cm Vascular Assessment Pulses: Dorsalis Pedis Palpable: [Left:Yes] Electronic Signature(s) Signed: 09/15/2020 5:08:20 PM By: Kela Millin Entered By: Kela Millin on 09/15/2020 08:42:16 -------------------------------------------------------------------------------- Multi Wound Chart Details Patient Name: Date of Service: Tracy Sermon. 09/15/2020 8:30 A M Medical Record Number: [id number] Patient Account Number: [id number] Date of Birth/Sex: Treating RN: 1933-07-15 (86 y.o. [name] Primary Care Makaela Cando: Lajean Manes T Other Clinician: Referring Aviona Martenson: Treating Remmy Riffe/Extender: [name] in Treatment: 58 Vital Signs Height(in): 69 Pulse(bpm): 43 Weight(lbs): 163 Blood Pressure(mmHg): 136/75 Body Mass Index(BMI): 24 Temperature(F): 97.5 Respiratory Rate(breaths/min): 19 Photos: [5:No Photos Left, Lateral Lower Leg] [8:No Photos Left, Proximal, Lateral Lower Leg]  [N/A:N/A N/A] Wound Location: [5:Gradually Appeared] [8:Gradually Appeared] [N/A:N/A] Wounding Event: [5:Venous Leg Ulcer] [8:Venous Leg Ulcer] [N/A:N/A] Primary Etiology: [5:Cataracts, Anemia, Arrhythmia,] [8:Cataracts, Anemia, Arrhythmia,] [N/A:N/A] Comorbid History: [5:Congestive Heart Failure, Hypertension, Peripheral Venous Disease, Received Radiation 03/21/2020] [8:Congestive Heart Failure, Hypertension, Peripheral Venous Disease, Received Radiation 09/15/2020] [N/A:N/A] Date Acquired: [5:25] [8:0] [N/A:N/A] Weeks of Treatment: [5:Open] [8:Open] [N/A:N/A] Wound Status: [5:Yes] [8:No] [N/A:N/A] Clustered Wound: [5:2] [8:N/A] [N/A:N/A] Clustered Quantity: [5:4.5x1.1x0.2] [8:0.5x0.3x0.2] [N/A:N/A] Measurements L x W x D (cm) [5:3.888] [8:0.118] [N/A:N/A] A (cm) : rea [5:0.778] [8:0.024] [N/A:N/A] Volume (cm) : [5:-673.00%] [8:N/A] [N/A:N/A] % Reduction in Area: [5:-1456.00%] [8:N/A] [N/A:N/A] % Reduction in Volume: [5:Full Thickness Without Exposed] [8:Full Thickness Without Exposed] [N/A:N/A] Classification: [5:Support Structures Medium] [8:Support Structures Small] [N/A:N/A] Exudate Amount: [5:Serosanguineous] [8:Serosanguineous] [N/A:N/A] Exudate Type: [5:red, brown] [8:red, brown] [N/A:N/A] Exudate Color: [5:Distinct, outline attached] [8:Distinct, outline attached] [N/A:N/A] Wound Margin: [5:Medium (34-66%)] [8:Medium (34-66%)] [N/A:N/A] Granulation Amount: [5:Red, Pink] [8:Pink] [N/A:N/A] Granulation Quality: [5:Medium (34-66%)] [8:Medium (34-66%)] [N/A:N/A] Necrotic Amount: [5:Fat Layer (Subcutaneous Tissue): Yes Fat Layer (Subcutaneous Tissue): Yes N/A] Exposed Structures: [5:Fascia: No Tendon: No Muscle: No Joint: No Bone: No Medium (34-66%)] [8:Fascia: No Tendon: No Muscle: No Joint: No Bone: No Small (1-33%)] [N/A:N/A] Epithelialization: [5:Debridement - Excisional] [8:N/A] [N/A:N/A] Debridement: Pre-procedure Verification/Time Out 09:05 [8:N/A] [N/A:N/A] Taken: [5:Other]  [8:N/A] [N/A:N/A] Pain Control: [5:Callus, Subcutaneous, Slough] [8:N/A] [N/A:N/A] Tissue Debrided: [5:Skin/Subcutaneous Tissue] [8:N/A] [N/A:N/A] Level: [5:4.5] [8:N/A] [N/A:N/A] Debridement A (sq cm): [5:rea Curette] [8:N/A] [N/A:N/A] Instrument: [5:Minimum] [8:N/A] [N/A:N/A] Bleeding: [5:Pressure] [8:N/A] [N/A:N/A] Hemostasis A chieved: [5:8] [8:N/A] [N/A:N/A] Procedural Pain: [5:4] [8:N/A] [N/A:N/A] Post Procedural Pain: [5:Procedure was tolerated well] [8:N/A] [N/A:N/A] Debridement Treatment Response: [5:4.5x1.1x0.1] [8:N/A] [N/A:N/A] Post Debridement Measurements L x W x D (cm) [5:0.389] [8:N/A] [N/A:N/A] Post Debridement Volume: (cm) [5:Compression Therapy] [8:Compression Seibert [N/A:N/A] Procedures Performed: [5:Debridement] Treatment Notes Wound #5 (Left, Lateral Lower Leg) 1. Cleanse With Wound Cleanser Soap and water 2. Periwound Care Moisturizing lotion TCA Cream 3. Primary Dressing Applied Calcium Alginate Ag 4. Secondary Dressing ABD Pad Dry Gauze 6. Support Layer Applied 3 layer compression wrap Notes NETTING. Wound #8 (Left, Proximal, Lateral Lower Leg) 1. Cleanse With Wound Cleanser Soap and water 2. Periwound Care Moisturizing lotion TCA Cream 3. Primary Dressing Applied Calcium Alginate Ag 4. Secondary Dressing ABD Pad Dry Gauze 6. Support Layer Applied 3 layer compression wrap Notes NETTING. Electronic Signature(s) Signed: 09/15/2020 4:54:01 PM By: Linton Ham MD Signed: 09/15/2020 5:03:51 PM By: [name] RN, BSN Entered By: Linton Ham on 09/15/2020 09:30:10 -------------------------------------------------------------------------------- Multi-Disciplinary Care Plan Details Patient Name: Date of Service: [name], [name] 09/15/2020 8:30 A M Medical Record Number: [id number] Patient Account Number: [id number] Date of Birth/Sex: Treating  RN: September 19, 1933 (86 y.o. [name] Primary Care Amia Rynders: Lajean Manes  T Other Clinician: Referring Wynter Grave: Treating Olia Hinderliter/Extender: Evelena Peat in Treatment: 92 Active Inactive Wound/Skin Impairment Nursing Diagnoses: Knowledge deficit related to ulceration/compromised skin integrity Goals: Patient/caregiver will verbalize understanding of skin care regimen Date Initiated: 12/20/2019 Target Resolution Date: 09/26/2020 Goal Status: Active Interventions: Assess patient/caregiver ability to perform ulcer/skin care regimen upon admission and as needed Provide education on ulcer and skin care Treatment Activities: Skin care regimen initiated : 12/20/2019 Topical wound management initiated : 12/20/2019 Notes: Electronic Signature(s) Signed: 09/15/2020 5:03:51 PM By: [name] RN, BSN Entered By: [name] on 09/15/2020 09:03:00 -------------------------------------------------------------------------------- Pain Assessment Details Patient Name: Date of Service: Tracy Banker J. 09/15/2020 8:30 A M Medical Record Number: [id number] Patient Account Number: [id number] Date of Birth/Sex: Treating RN: July 03, 1933 (86 y.o. [name] Primary Care Mozell Hardacre: Patrina Levering Other Clinician: Referring Moncerrath Berhe: Treating Shammond Arave/Extender: Evelena Peat in Treatment: 39 Active Problems Location of Pain Severity and Description of Pain Patient Has Paino No Site Locations Pain Management and Medication Current Pain Management: Electronic Signature(s) Signed: 09/15/2020 5:08:20 PM By: Kela Millin Entered By: Kela Millin on 09/15/2020 08:39:12 -------------------------------------------------------------------------------- Patient/Caregiver Education Details Patient Name: Date of Service: Tracy Sermon 10/4/2021andnbsp8:30 A M Medical Record Number: [id number] Patient Account Number: [id number] Date of Birth/Gender: Treating RN: 1933/10/28 (86 y.o. [name] Primary Care Physician: Lajean Manes T Other Clinician: Referring Physician: Treating Physician/Extender: Evelena Peat in Treatment: 53 Education Assessment Education Provided To: Patient Education Topics Provided Venous: Methods: Explain/Verbal Responses: Reinforcements needed, State content correctly Wound/Skin Impairment: Methods: Explain/Verbal Responses: Reinforcements needed, State content correctly Electronic Signature(s) Signed: 09/15/2020 5:03:51 PM By: [name] RN, BSN Entered By: [name] on 09/15/2020 09:04:34 -------------------------------------------------------------------------------- Wound Assessment Details Patient Name: Date of Service: Tracy Sermon. 09/15/2020 8:30 A M Medical Record Number: [id number] Patient Account Number: [id number] Date of Birth/Sex: Treating RN: 03-Aug-1933 (86 y.o. [name] Primary Care Brent Noto: Lajean Manes T Other Clinician: Referring Adil Tugwell: Treating Cortni Tays/Extender: [name] in Treatment: 38 Wound Status Wound Number: 5 Primary Venous Leg Ulcer Etiology: Wound Location: Left, Lateral Lower Leg Wound Open Wounding Event: Gradually Appeared Status: Date Acquired: 03/21/2020 Comorbid Cataracts, Anemia, Arrhythmia, Congestive Heart Failure, Weeks Of Treatment: 25 History: Hypertension, Peripheral Venous Disease, Received Radiation Clustered Wound: Yes Wound Measurements Length: (cm) 4.5 Width: (cm) 1.1 Depth: (cm) 0.2 Clustered Quantity: 2 Area: (cm) 3. Volume: (cm) 0. % Reduction in Area: -673% % Reduction in Volume: -1456% Epithelialization: Medium (34-66%) Tunneling: No 888 Undermining: No 778 Wound Description Classification: Full Thickness Without Exposed Support Structures Wound Margin: Distinct, outline attached Exudate Amount: Medium Exudate Type: Serosanguineous Exudate Color: red, brown Foul Odor After  Cleansing: No Slough/Fibrino Yes Wound Bed Granulation Amount: Medium (34-66%) Exposed Structure Granulation Quality: Red, Pink Fascia Exposed: No Necrotic Amount: Medium (34-66%) Fat Layer (Subcutaneous Tissue) Exposed: Yes Necrotic Quality: Adherent Slough Tendon Exposed: No Muscle Exposed: No Joint Exposed: No Bone Exposed: No Treatment Notes Wound #5 (Left, Lateral Lower Leg) 1. Cleanse With Wound Cleanser Soap and water 2. Periwound Care Moisturizing lotion TCA Cream 3. Primary Dressing Applied Calcium Alginate Ag 4. Secondary Dressing ABD Pad Dry Gauze 6. Support Layer Applied 3 layer compression wrap Notes NETTING. Electronic Signature(s) Signed: 09/15/2020 5:08:20 PM By: Kela Millin Entered By: Kela Millin on 09/15/2020 08:47:42 -------------------------------------------------------------------------------- Wound Assessment Details Patient Name: Date  of Service: [name], [name] 09/15/2020 8:30 A M Medical Record Number: [id number] Patient Account Number: [id number] Date of Birth/Sex: Treating RN: 04/03/1933 (86 y.o. [name] Primary Care Elek Holderness: Lajean Manes T Other Clinician: Referring Dedee Liss: Treating Jaythen Hamme/Extender: [name] in Treatment: 38 Wound Status Wound Number: 8 Primary Venous Leg Ulcer Etiology: Wound Location: Left, Proximal, Lateral Lower Leg Wound Open Wounding Event: Gradually Appeared Status: Date Acquired: 09/15/2020 Comorbid Cataracts, Anemia, Arrhythmia, Congestive Heart Failure, Weeks Of Treatment: 0 History: Hypertension, Peripheral Venous Disease, Received Radiation Clustered Wound: No Wound Measurements Length: (cm) 0.5 Width: (cm) 0.3 Depth: (cm) 0.2 Area: (cm) 0.118 Volume: (cm) 0.024 % Reduction in Area: % Reduction in Volume: Epithelialization: Small (1-33%) Tunneling: No Undermining: No Wound Description Classification: Full Thickness Without  Exposed Support Structures Wound Margin: Distinct, outline attached Exudate Amount: Small Exudate Type: Serosanguineous Exudate Color: red, brown Foul Odor After Cleansing: No Slough/Fibrino Yes Wound Bed Granulation Amount: Medium (34-66%) Exposed Structure Granulation Quality: Pink Fascia Exposed: No Necrotic Amount: Medium (34-66%) Fat Layer (Subcutaneous Tissue) Exposed: Yes Necrotic Quality: Adherent Slough Tendon Exposed: No Muscle Exposed: No Joint Exposed: No Bone Exposed: No Treatment Notes Wound #8 (Left, Proximal, Lateral Lower Leg) 1. Cleanse With Wound Cleanser Soap and water 2. Periwound Care Moisturizing lotion TCA Cream 3. Primary Dressing Applied Calcium Alginate Ag 4. Secondary Dressing ABD Pad Dry Gauze 6. Support Layer Applied 3 layer compression wrap Notes NETTING. Electronic Signature(s) Signed: 09/15/2020 5:08:20 PM By: Kela Millin Entered By: Kela Millin on 09/15/2020 08:47:16 -------------------------------------------------------------------------------- Vitals Details Patient Name: Date of Service: [name], [place]. 09/15/2020 8:30 A M Medical Record Number: [id number] Patient Account Number: [id number] Date of Birth/Sex: Treating RN: 21-May-1933 (86 y.o. [name] Primary Care Janene Yousuf: Lajean Manes T Other Clinician: Referring Daneli Butkiewicz: Treating Chidinma Clites/Extender: Evelena Peat in Treatment: 64 Vital Signs Time Taken: 08:37 Temperature (F): 97.5 Height (in): 69 Pulse (bpm): 43 Weight (lbs): 163 Respiratory Rate (breaths/min): 19 Body Mass Index (BMI): 24.1 Blood Pressure (mmHg): 136/75 Reference Range: 80 - 120 mg / dl Electronic Signature(s) Signed: 09/15/2020 5:08:20 PM By: Kela Millin Entered By: Kela Millin on 09/15/2020 08:38:36

## 2020-09-15 NOTE — Progress Notes (Signed)
[name], [name] ([id number]) Visit Report for 09/15/2020 Debridement Details Patient Name: Date of Service: [name], [name] 09/15/2020 8:30 A M Medical Record Number: [id number] Patient Account Number: [id number] Date of Birth/Sex: Treating RN: May 06, 1933 (86 y.o. [name] Primary Care Provider: Lajean Manes T Other Clinician: Referring Provider: Treating Provider/Extender: Evelena Peat in Treatment: 38 Debridement Performed for Assessment: Wound #5 Left,Lateral Lower Leg Performed By: Physician Ricard Dillon., MD Debridement Type: Debridement Severity of Tissue Pre Debridement: Fat layer exposed Level of Consciousness (Pre-procedure): Awake and Alert Pre-procedure Verification/Time Out Yes - 09:05 Taken: Start Time: 09:05 Pain Control: Other : benzocaine 20% spray T Area Debrided (L x W): otal 3 (cm) x 1.5 (cm) = 4.5 (cm) Tissue and other material debrided: Viable, Non-Viable, Callus, Slough, Subcutaneous, Skin: Epidermis, Fibrin/Exudate, Slough Level: Skin/Subcutaneous Tissue Debridement Description: Excisional Instrument: Curette Bleeding: Minimum Hemostasis Achieved: Pressure End Time: 09:09 Procedural Pain: 8 Post Procedural Pain: 4 Response to Treatment: Procedure was tolerated well Level of Consciousness (Post- Awake and Alert procedure): Post Debridement Measurements of Total Wound Length: (cm) 4.5 Width: (cm) 1.1 Depth: (cm) 0.1 Volume: (cm) 0.389 Character of Wound/Ulcer Post Debridement: Requires Further Debridement Severity of Tissue Post Debridement: Fat layer exposed Post Procedure Diagnosis Same as Pre-procedure Electronic Signature(s) Signed: 09/15/2020 4:54:01 PM By: Linton Ham MD Signed: 09/15/2020 5:03:51 PM By: [name] RN, BSN Entered By: Linton Ham on 09/15/2020 09:30:28 -------------------------------------------------------------------------------- HPI Details Patient Name: Date of  Service: Tracy Banker J. 09/15/2020 8:30 A M Medical Record Number: [id number] Patient Account Number: [id number] Date of Birth/Sex: Treating RN: 1933-03-21 (86 y.o. [name] Primary Care Provider: Lajean Manes T Other Clinician: Referring Provider: Treating Provider/Extender: Evelena Peat in Treatment: 34 History of Present Illness HPI Description: ADMISSION 12/20/2019 This is an 86 year old woman referred by her primary physician Dr. Felipa Eth for review of a wound on her right lateral lower leg. She was actually in this clinic on 2 separate occasions in 2010 and 2012 cared for by Dr. Sherilyn Cooter. At that point in time she had wounds on her right leg as well. She tells [place] to 1 month ago she noticed a scab building up on her right lateral lower leg this opened into a wound. There was no overt cause of this no trauma, no infection that she is aware of. She has a history of chronic venous insufficiency and wears compression stockings fairly religiously indeed she is done well over the last 8 years since she was last in this clinic. She has been applying Vaseline on this and a covering phone. This is not progressing towards healing. Past medical history; interstitial lung disease, chronic atrial fibrillation status post pacemaker, osteoarthritis of the left knee, left breast CA, chronic repeat venous insufficiency. She takes Eliquis for her atrial fibrillation stroke prophylaxis. ABI in our clinic was 0.99 on the right 1/15; superficial wound on the right lateral calf in the setting of severe acute skin changes from chronic venous insufficiency and lymphedema. We used Iodoflex last week she complained of a lot of pain 1/22; this is a small but difficult wound on the right lateral calf in the setting of severe chronic venous insufficiency and secondary lymphedema. She continues to state the wounds things hurts when she is up on it but seems to be relieved by  putting her leg up. I changed her to [facility] last week because the Iodoflex seem to be causing stinging. 1/29. This wound  appears to be contracting somewhat. Changes of chronic venous insufficiency with secondary lymphedema 2/12; not as good as surface today and slightly bigger. I had to change her from Iodoflex to [facility] because of the stinging pain although she does not think it was any better on the [facility]. She comes into clinic today with a area on the medial left great toe. She said she noticed blood on her sock last Saturday. She had some form of bunion surgery by Dr. Ila Mcgill her podiatrist sometime in 2019 she said he "shave the area". I could not find his operative report although I did see reference to the bunion in the area. 2/19; somewhat improved wound on the right lateral lower leg however the wound she came in on the bunion of her left great toe is actually I think larger still somewhat inflamed. We have been using Iodoflex 2/26; not much improvement in either wound area which is the original venous wound on the right lateral lower leg and in the area on the bunion of her left great toe. This still looks somewhat inflamed and tender. We have been using Iodoflex with open much improvement. 3/4; in general both her wounds look better this includes the original venous insufficiency wound on the right lateral lower leg and the area on her tip of her bunion on the left great toe medial aspect of the MTP. Change to Hydrofera Blue last week 3/12; the patient still has a small geographic shaped wound on the right lateral lower leg. We have been using Hydrofera Blue but I changed her to endoform today. She also has the area in the tip of the bunion of the left great toe. We will try Hydrofera Blue here as well 3/19; the small geographic wound on the right lateral lower leg has not filled in. There is some surrounding induration we have noticed this previously. This  may be venous inflammation but I wonder about biopsying this if this is not closing up. The area over the left medial first MTP [bunion deformity] requires debridement. This is not closing in. I am not convinced she is offloading adequately 3/26; right lateral lower leg in the setting of severe venous insufficiency and also an area over the first MTP bunion deformity. No debridement is required. We have been using endoform 4/9; right lateral lower leg wound in the setting of severe venous insufficiency and also a refractory area over the first MTP bunion deformity. The area on the right lateral lower leg is just about closed there is still a minor open area here. She comes in today with a mirror image area on the left lateral lower leg. She says this started as a scab 2 weeks ago and is gradually morphed into an open wound very similar to what she has on the right leg. She has severe bilateral venous insufficiency with venous hypertension obvious from clinical exam. 4/16; her original wound the right lateral leg wound in the setting of chronic venous hypertension is a small open area but very small. We have been using endoform. Her wound over the left first MTP bunion deformity also appears to be small and closing in. Unfortunately she has a large relative area on the left lateral calf which was new last week. Very adherent debris over this we have been using Iodoflex however that may be contributing to the debris on the wound surface 4/23; the original wound is closed and the area on the left first metatarsal bunion deformity is  also almost closed the new area from last week required debridement. She went for her reflux studies that did not take her lower extremity wraps off. This is not helpful. She did have significant reflux in the right common femoral vein. I am not going to press this issue further. She clinically has severe venous hypertension 4/29; the original wound is closed on the right  lateral lower calf. The area on the left first metatarsal/bunion deformity has a small slitlike opening that is still not closed. The real problem here is now wound on the left lateral calf which is necrotic and deep. We used Iodoflex on this wound last time. Silver alginate on the left first toe The patient has [place] wraps. We should be able to transition the right leg into compression stockings and were doing this today. 5/7; the original wound remains closed on the right lateral calf. The left first metatarsal head still is open. Deterioration in the wound which is the new wound from several weeks ago on the left lateral calf. The patient is not aware how this could have happened. We have been using Sorbact starting last week under 3 layer compression 5/14; the right lateral calf is closed the area on the first met head on the left is closed However the new area from 3 weeks ago on the left lateral calf continues to expand. There is raised nodular tissue around the wound necrotic debris on the surface. Circumference of the wound also looks necrotic. It looks as though there is involvement of the tissue under the skin around the large area of this wound which looks threatened. She is complaining of pain. Culture of this wound I did last week showed rare coag negative staph variant I have never heard of although I am doubtful this has clinical significance 05/09/20-Patient returns with a left lateral calf wound looking about the same, the biopsies reviewed show no atypical features and consistent with trauma or pressure injury or stasis change, patient has appointment to be scheduled with vascular We are using silver alginate and 3 layer compression Patient was started on Trental by dermatology which she has been taking for a week now 6/4; left lateral calf wound. I reviewed the dermatology notes with Dr. Martin Majestic from [facility] dermatology. See suggested the possibility of livedoid vasculopathy  possibility of additional biopsies which I think would have to be punch biopsies. Put her on pentoxifylline noteworthy that she is on Eliquis as well. We have been using silver alginate under compression. She has an appointment for repeat vascular studies on 7/6. This gets back to the fact that they did not take the wrap off on the original studies that were done. I do not believe she has an arterial issue. 6/10; left lateral calf wound. I put her on Hydrofera Blue last week. Some improvement in the surface condition of the wound. She has repeat vascular studies/reflux studies on 7/6. We are trying to get Apligraf through [place] [employer]. Her husband states he looked on the website this is not going to be approved. I am really not sure if this is a "class effect" 6/18; somewhat surprisingly Apligraf was approved and 100% covered. Her husband was also quite shocked by this. Nevertheless we have her repeat vascular studies on 7/6 and I will not be able to apply this until this is done. We are using Hydrofera Blue to the wound on the left. Her original wound on the right lower leg has maintain closure using compression stocking 6/25; repeat  vascular studies/venous on 7/6; we will apply the Apligraf I think that week. We are also going to work around a week's vacation they have the first week in August. 7/2; reflux studies on 7/6;. Likely place first Apligraf from the major wound area on 7/9; 7/9; patient was kindly seen by Dr. Donnetta Hutching of vein and vascular to review her circulation status with the refractory wounds in the left leg. At first wounds in this clinic were on the right leg and they heal she now has a juxta lite stocking. Dr. Donnetta Hutching thought she had venous stasis disease with venous ulcerations. He did not feel that there was anything that would suggest to expect approval with a blush ablation of her saphenous vein the saphenous veins were felt to be relatively small in caliber. She was not felt  to have an arterial issue Apligraf #1 apply today in the standard fashion 7/23; the patient came in today complaining of a lot of pain in the left lower leg where the wound is. We applied Apligraf 2 weeks ago I also increased her compression from 3-4 layer wondering about the venous hypertension. She arrives today with her original wound that we have been treating with Apligraf looking quite healthy although there is some surface slough. Problematically she has de-epithelialized and developed erythema below the wound towards the lateral malleolus itself. This is somewhat angry. I think this is venous stasis loss of epithelialization. I do not believe this is infection . Apligraf #2 applied to the original wound area 8/9-Patient returns at 2 weeks for Apligraf #3, the left leg venous leg ulcer area had more slough this time 8/23 Apligraf #4 her wound areas are small however she is still complaining of pain. She has previously been seen by vein and vascular. She has not felt to have an arterial issue she was felt to have venous stasis disease. She originally came here with a wound on her right leg on the right lateral calf this healed out. She then developed a difficult area on the left. She saw dermatology wondered whether this could be a vasculopathy. I am not sure if she is still on pentoxifylline the dermatology suggested I will have to clarify this with 9/7; patient complains of unrelenting pain making her life miserable. I been using Apligraf to close her major wound down and the major wound has come down to 2 open areas which are small and granulated. However there is erythema both inferiorly and superiorly cratered thick skin with wound issues between these areas extending into the lateral calf. There is erythema here and tenderness. I have been managing this largely as this this was severe stasis dermatitis. Vein and vascular did not feel that she would benefit from an ablation. She went to see  [facility] dermatology some months ago who wondered about a vasculopathy and suggested a biopsy but I am reluctant to go forward with that here. The patient is on Eliquis. 9/13; the patient saw Dr. Ubaldo Glassing on 9/8; she noted superficial cutaneous breakdown located on her left inferior lateral posterior tibia. She does not have a specific diagnosis for the patient's ulcer. Wanted to rule out squamous cell carcinoma and pyoderma gangrenosum she did a punch biopsy that is still bleeding. They came back for a dressing change after that procedure. They have still not heard the results of the biopsy. Was still using silver alginate as the primary dressing. She is tolerating the doxycycline although she had to take it with food 9/20; the biopsy  that was done by dermatology/Dr. Ubaldo Glassing on 9/8 showed findings consistent with stasis dermatitis. I think we can put any thought of an alternative diagnosis to rest here. She is already seen vein and vascular today did not think she was a candidate for ablations. They felt she had adequate arterial flow. We have been using silver alginate and TCA I put her on doxycycline last time because of erythema around the wound which very well could be venous inflammation/stasis dermatitis but I had some concerns about cellulitis. Doxycycline being a good drug for this because of its anti-inflammatory as well as its antibiotic effects. We also received a note from the patient's cardiologist Dr. Darene Lamer aylor about interactions between doxycycline and propafenone. While I was aware of the potential interaction between propafenone and quinolones I was not aware of any interaction between doxycycline and propafenone. Nor can I find this interaction described. Nevertheless I will follow Dr. Tanna Furry expertise in this area. She was having nausea in any case and was about to stop it even without cardiology advice. She was also in the ER because of bleeding from the biopsy site and in urgent care  because of nausea 9/27 weekly follow-up. Area on the left lateral calf venous insufficiency wounds with at one point severe stasis dermatitis. She is actually doing quite nicely. She has 4 or 5 small open areas. Some of them with eschared surfaces which I removed with a #5 curette. The skin around here looks excellent. Using silver alginate Liberal TCA under compression 10/4; left lateral calf venous insufficiency wounds. Using silver alginate Electronic Signature(s) Signed: 09/15/2020 4:54:01 PM By: Linton Ham MD Entered By: Linton Ham on 09/15/2020 09:31:45 -------------------------------------------------------------------------------- Physical Exam Details Patient Name: Date of Service: Tracy Sermon. 09/15/2020 8:30 A M Medical Record Number: [id number] Patient Account Number: [id number] Date of Birth/Sex: Treating RN: August 15, 1933 (86 y.o. [name] Primary Care Provider: Lajean Manes T Other Clinician: Referring Provider: Treating Provider/Extender: [name] in Treatment: 34 Constitutional Sitting or standing Blood Pressure is within target range for patient.. Pulse regular and within target range for patient.Marland Kitchen Respirations regular, non-labored and within target range.. Temperature is normal and within the target range for the patient.Marland Kitchen Appears in no distress. Notes Wound exam; left lateral ankle and calf. There is 5 small open areas here although it remains of a very large wound area nevertheless the bottom 3 all had very thick eschar around the wound edge and very adherent necrotic debris over the center. These were all debrided. As usual she does not tolerate this well. Hemostasis with direct pressure the top 2 areas looked as though they might be closed I did not debride these Electronic Signature(s) Signed: 09/15/2020 4:54:01 PM By: Linton Ham MD Entered By: Linton Ham on 09/15/2020  09:34:50 -------------------------------------------------------------------------------- Physician Orders Details Patient Name: Date of Service: Tracy Sermon. 09/15/2020 8:30 A M Medical Record Number: [id number] Patient Account Number: [id number] Date of Birth/Sex: Treating RN: 1933-12-08 (86 y.o. [name] Primary Care Provider: Lajean Manes T Other Clinician: Referring Provider: Treating Provider/Extender: Evelena Peat in Treatment: 45 Verbal / Phone Orders: No Diagnosis Coding ICD-10 Coding Code Description I87.321 Chronic venous hypertension (idiopathic) with inflammation of right lower extremity L97.828 Non-pressure chronic ulcer of other part of left lower leg with other specified severity I87.322 Chronic venous hypertension (idiopathic) with inflammation of left lower extremity L97.522 Non-pressure chronic ulcer of other part of left foot with fat layer exposed  Follow-up Appointments Return Appointment in 1 week. Dressing Change Frequency Wound #5 Left,Lateral Lower Leg Do not change entire dressing for one week. Skin Barriers/Peri-Wound Care Moisturizing lotion TCA Cream or Ointment - to red/inflamed areas Wound Cleansing May shower with protection. - use cast protector Primary Wound Dressing Wound #5 Left,Lateral Lower Leg Calcium Alginate with Silver Wound #8 Left,Proximal,Lateral Lower Leg Calcium Alginate with Silver Secondary Dressing Wound #5 Left,Lateral Lower Leg Dry Gauze ABD pad Edema Control 3 Layer Compression System - Left Lower Extremity Avoid standing for long periods of time Elevate legs to the level of the heart or above for 30 minutes daily and/or when sitting, a frequency of: - throughout the day. Exercise regularly Support Garment 20-30 mm/Hg pressure to: - patient to apply farrow wrap 4000 to right leg. Apply in the morning and remove at night. Off-Loading Open toe surgical shoe to: - left  foot Patient Medications llergies: codeine, adhesive, latex, Percodan A Notifications Medication Indication Start End prior to debridement 09/15/2020 benzocaine DOSE topical 20 % aerosol - aerosol topical Electronic Signature(s) Signed: 09/15/2020 4:54:01 PM By: Linton Ham MD Signed: 09/15/2020 5:03:51 PM By: [name] RN, BSN Entered By: [name] on 09/15/2020 09:09:32 -------------------------------------------------------------------------------- Problem List Details Patient Name: Date of Service: Tracy Sermon. 09/15/2020 8:30 A M Medical Record Number: [id number] Patient Account Number: [id number] Date of Birth/Sex: Treating RN: [birth year] (86 y.o. [name] Primary Care Provider: Lajean Manes T Other Clinician: Referring Provider: Treating Provider/Extender: Evelena Peat in Treatment: 54 Active Problems ICD-10 Encounter Code Description Active Date MDM Diagnosis I87.321 Chronic venous hypertension (idiopathic) with inflammation of right lower 12/20/2019 No Yes extremity L97.828 Non-pressure chronic ulcer of other part of left lower leg with other specified 03/21/2020 No Yes severity I87.322 Chronic venous hypertension (idiopathic) with inflammation of left lower 02/14/2020 No Yes extremity L97.522 Non-pressure chronic ulcer of other part of left foot with fat layer exposed 01/25/2020 No Yes Inactive Problems ICD-10 Code Description Active Date Inactive Date L97.812 Non-pressure chronic ulcer of other part of right lower leg with fat layer exposed 12/20/2019 12/20/2019 Resolved Problems Electronic Signature(s) Signed: 09/15/2020 4:54:01 PM By: Linton Ham MD Entered By: Linton Ham on 09/15/2020 09:29:53 -------------------------------------------------------------------------------- Progress Note Details Patient Name: Date of Service: Tracy Banker J. 09/15/2020 8:30 A M Medical Record Number: [id number] Patient  Account Number: [id number] Date of Birth/Sex: Treating RN: 07-24-1933 (86 y.o. [name] Primary Care Provider: Lajean Manes T Other Clinician: Referring Provider: Treating Provider/Extender: Evelena Peat in Treatment: 36 Subjective History of Present Illness (HPI) ADMISSION 12/20/2019 This is an 86 year old woman referred by her primary physician Dr. Felipa Eth for review of a wound on her right lateral lower leg. She was actually in this clinic on 2 separate occasions in 2010 and 2012 cared for by Dr. Sherilyn Cooter. At that point in time she had wounds on her right leg as well. She tells [place] to 1 month ago she noticed a scab building up on her right lateral lower leg this opened into a wound. There was no overt cause of this no trauma, no infection that she is aware of. She has a history of chronic venous insufficiency and wears compression stockings fairly religiously indeed she is done well over the last 8 years since she was last in this clinic. She has been applying Vaseline on this and a covering phone. This is not progressing towards healing. Past medical history; interstitial lung disease, chronic atrial  fibrillation status post pacemaker, osteoarthritis of the left knee, left breast CA, chronic repeat venous insufficiency. She takes Eliquis for her atrial fibrillation stroke prophylaxis. ABI in our clinic was 0.99 on the right 1/15; superficial wound on the right lateral calf in the setting of severe acute skin changes from chronic venous insufficiency and lymphedema. We used Iodoflex last week she complained of a lot of pain 1/22; this is a small but difficult wound on the right lateral calf in the setting of severe chronic venous insufficiency and secondary lymphedema. She continues to state the wounds things hurts when she is up on it but seems to be relieved by putting her leg up. I changed her to [facility] last week because the Iodoflex seem to  be causing stinging. 1/29. This wound appears to be contracting somewhat. Changes of chronic venous insufficiency with secondary lymphedema 2/12; not as good as surface today and slightly bigger. I had to change her from Iodoflex to [facility] because of the stinging pain although she does not think it was any better on the [facility]. ooShe comes into clinic today with a area on the medial left great toe. She said she noticed blood on her sock last Saturday. She had some form of bunion surgery by Dr. Ila Mcgill her podiatrist sometime in 2019 she said he "shave the area". I could not find his operative report although I did see reference to the bunion in the area. 2/19; somewhat improved wound on the right lateral lower leg however the wound she came in on the bunion of her left great toe is actually I think larger still somewhat inflamed. We have been using Iodoflex 2/26; not much improvement in either wound area which is the original venous wound on the right lateral lower leg and in the area on the bunion of her left great toe. This still looks somewhat inflamed and tender. We have been using Iodoflex with open much improvement. 3/4; in general both her wounds look better this includes the original venous insufficiency wound on the right lateral lower leg and the area on her tip of her bunion on the left great toe medial aspect of the MTP. Change to Hydrofera Blue last week 3/12; the patient still has a small geographic shaped wound on the right lateral lower leg. We have been using Hydrofera Blue but I changed her to endoform today. She also has the area in the tip of the bunion of the left great toe. We will try Hydrofera Blue here as well 3/19; the small geographic wound on the right lateral lower leg has not filled in. There is some surrounding induration we have noticed this previously. This may be venous inflammation but I wonder about biopsying this if this is not closing  up. The area over the left medial first MTP [bunion deformity] requires debridement. This is not closing in. I am not convinced she is offloading adequately 3/26; right lateral lower leg in the setting of severe venous insufficiency and also an area over the first MTP bunion deformity. No debridement is required. We have been using endoform 4/9; right lateral lower leg wound in the setting of severe venous insufficiency and also a refractory area over the first MTP bunion deformity. The area on the right lateral lower leg is just about closed there is still a minor open area here. She comes in today with a mirror image area on the left lateral lower leg. She says this started as a scab  2 weeks ago and is gradually morphed into an open wound very similar to what she has on the right leg. She has severe bilateral venous insufficiency with venous hypertension obvious from clinical exam. 4/16; her original wound the right lateral leg wound in the setting of chronic venous hypertension is a small open area but very small. We have been using endoform. Her wound over the left first MTP bunion deformity also appears to be small and closing in. Unfortunately she has a large relative area on the left lateral calf which was new last week. Very adherent debris over this we have been using Iodoflex however that may be contributing to the debris on the wound surface 4/23; the original wound is closed and the area on the left first metatarsal bunion deformity is also almost closed the new area from last week required debridement. She went for her reflux studies that did not take her lower extremity wraps off. This is not helpful. She did have significant reflux in the right common femoral vein. I am not going to press this issue further. She clinically has severe venous hypertension 4/29; the original wound is closed on the right lateral lower calf. The area on the left first metatarsal/bunion deformity has a small  slitlike opening that is still not closed. The real problem here is now wound on the left lateral calf which is necrotic and deep. We used Iodoflex on this wound last time. Silver alginate on the left first toe The patient has [place] wraps. We should be able to transition the right leg into compression stockings and were doing this today. 5/7; the original wound remains closed on the right lateral calf. The left first metatarsal head still is open. Deterioration in the wound which is the new wound from several weeks ago on the left lateral calf. The patient is not aware how this could have happened. We have been using Sorbact starting last week under 3 layer compression 5/14; the right lateral calf is closed the area on the first met head on the left is closed However the new area from 3 weeks ago on the left lateral calf continues to expand. There is raised nodular tissue around the wound necrotic debris on the surface. Circumference of the wound also looks necrotic. It looks as though there is involvement of the tissue under the skin around the large area of this wound which looks threatened. She is complaining of pain. Culture of this wound I did last week showed rare coag negative staph variant I have never heard of although I am doubtful this has clinical significance 05/09/20-Patient returns with a left lateral calf wound looking about the same, the biopsies reviewed show no atypical features and consistent with trauma or pressure injury or stasis change, patient has appointment to be scheduled with vascular We are using silver alginate and 3 layer compression Patient was started on Trental by dermatology which she has been taking for a week now 6/4; left lateral calf wound. I reviewed the dermatology notes with Dr. Martin Majestic from [facility] dermatology. 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 Put her  on pentoxifylline noteworthy that she is on Eliquis as well. We have been using silver alginate under compression. She has an appointment for repeat vascular studies on 7/6. This gets back to the fact that they did not take the wrap off on the original studies that were done. I do not believe  she has an arterial issue. 6/10; left lateral calf wound. I put her on Hydrofera Blue last week. Some improvement in the surface condition of the wound. She has repeat vascular studies/reflux studies on 7/6. We are trying to get Apligraf through [place] [employer]. Her husband states he looked on the website this is not going to be approved. I am really not sure if this is a "class effect" 6/18; somewhat surprisingly Apligraf was approved and 100% covered. Her husband was also quite shocked by this. Nevertheless we have her repeat vascular studies on 7/6 and I will not be able to apply this until this is done. We are using Hydrofera Blue to the wound on the left. Her original wound on the right lower leg has maintain closure using compression stocking 6/25; repeat vascular studies/venous on 7/6; we will apply the Apligraf I think that week. We are also going to work around a week's vacation they have the first week in August. 7/2; reflux studies on 7/6;. Likely place first Apligraf from the major wound area on 7/9; 7/9; patient was kindly seen by Dr. Donnetta Hutching of vein and vascular to review her circulation status with the refractory wounds in the left leg. At first wounds in this clinic were on the right leg and they heal she now has a juxta lite stocking. Dr. Donnetta Hutching thought she had venous stasis disease with venous ulcerations. He did not feel that there was anything that would suggest to expect approval with a blush ablation of her saphenous vein the saphenous veins were felt to be relatively small in caliber. She was not felt to have an arterial issue Apligraf #1 apply today in the standard fashion 7/23; the  patient came in today complaining of a lot of pain in the left lower leg where the wound is. We applied Apligraf 2 weeks ago I also increased her compression from 3-4 layer wondering about the venous hypertension. She arrives today with her original wound that we have been treating with Apligraf looking quite healthy although there is some surface slough. Problematically she has de-epithelialized and developed erythema below the wound towards the lateral malleolus itself. This is somewhat angry. I think this is venous stasis loss of epithelialization. I do not believe this is infection . Apligraf #2 applied to the original wound area 8/9-Patient returns at 2 weeks for Apligraf #3, the left leg venous leg ulcer area had more slough this time 8/23 Apligraf #4 her wound areas are small however she is still complaining of pain. She has previously been seen by vein and vascular. She has not felt to have an arterial issue she was felt to have venous stasis disease. She originally came here with a wound on her right leg on the right lateral calf this healed out. She then developed a difficult area on the left. She saw dermatology wondered whether this could be a vasculopathy. I am not sure if she is still on pentoxifylline the dermatology suggested I will have to clarify this with 9/7; patient complains of unrelenting pain making her life miserable. I been using Apligraf to close her major wound down and the major wound has come down to 2 open areas which are small and granulated. However there is erythema both inferiorly and superiorly cratered thick skin with wound issues between these areas extending into the lateral calf. There is erythema here and tenderness. I have been managing this largely as this this was severe stasis dermatitis. Vein and vascular did not feel that  she would benefit from an ablation. She went to see [facility] dermatology some months ago who wondered about a vasculopathy and  suggested a biopsy but I am reluctant to go forward with that here. The patient is on Eliquis. 9/13; the patient saw Dr. Ubaldo Glassing on 9/8; she noted superficial cutaneous breakdown located on her left inferior lateral posterior tibia. She does not have a specific diagnosis for the patient's ulcer. Wanted to rule out squamous cell carcinoma and pyoderma gangrenosum she did a punch biopsy that is still bleeding. They came back for a dressing change after that procedure. They have still not heard the results of the biopsy. Was still using silver alginate as the primary dressing. She is tolerating the doxycycline although she had to take it with food 9/20; the biopsy that was done by dermatology/Dr. Ubaldo Glassing on 9/8 showed findings consistent with stasis dermatitis. I think we can put any thought of an alternative diagnosis to rest here. She is already seen vein and vascular today did not think she was a candidate for ablations. They felt she had adequate arterial flow. We have been using silver alginate and TCA I put her on doxycycline last time because of erythema around the wound which very well could be venous inflammation/stasis dermatitis but I had some concerns about cellulitis. Doxycycline being a good drug for this because of its anti-inflammatory as well as its antibiotic effects. We also received a note from the patient's cardiologist Dr. Darene Lamer aylor about interactions between doxycycline and propafenone. While I was aware of the potential interaction between propafenone and quinolones I was not aware of any interaction between doxycycline and propafenone. Nor can I find this interaction described. Nevertheless I will follow Dr. Tanna Furry expertise in this area. She was having nausea in any case and was about to stop it even without cardiology advice. She was also in the ER because of bleeding from the biopsy site and in urgent care because of nausea 9/27 weekly follow-up. Area on the left lateral calf  venous insufficiency wounds with at one point severe stasis dermatitis. She is actually doing quite nicely. She has 4 or 5 small open areas. Some of them with eschared surfaces which I removed with a #5 curette. The skin around here looks excellent. Using silver alginate Liberal TCA under compression 10/4; left lateral calf venous insufficiency wounds. Using silver alginate Objective Constitutional Sitting or standing Blood Pressure is within target range for patient.. Pulse regular and within target range for patient.Marland Kitchen Respirations regular, non-labored and within target range.. Temperature is normal and within the target range for the patient.Marland Kitchen Appears in no distress. Vitals Time Taken: 8:37 AM, Height: 69 in, Weight: 163 lbs, BMI: 24.1, Temperature: 97.5 F, Pulse: 43 bpm, Respiratory Rate: 19 breaths/min, Blood Pressure: 136/75 mmHg. General Notes: Wound exam; left lateral ankle and calf. There is 5 small open areas here although it remains of a very large wound area nevertheless the bottom 3 all had very thick eschar around the wound edge and very adherent necrotic debris over the center. These were all debrided. As usual she does not tolerate this well. Hemostasis with direct pressure the top 2 areas looked as though they might be closed I did not debride these Integumentary (Hair, Skin) Wound #5 status is Open. Original cause of wound was Gradually Appeared. The wound is located on the Left,Lateral Lower Leg. The wound measures 4.5cm length x 1.1cm width x 0.2cm depth; 3.888cm^2 area and 0.778cm^3 volume. There is Fat Layer (Subcutaneous Tissue)  exposed. There is no tunneling or undermining noted. There is a medium amount of serosanguineous drainage noted. The wound margin is distinct with the outline attached to the wound base. There is medium (34-66%) red, pink granulation within the wound bed. There is a medium (34-66%) amount of necrotic tissue within the wound bed including Adherent  Slough. Wound #8 status is Open. Original cause of wound was Gradually Appeared. The wound is located on the Left,Proximal,Lateral Lower Leg. The wound measures 0.5cm length x 0.3cm width x 0.2cm depth; 0.118cm^2 area and 0.024cm^3 volume. There is Fat Layer (Subcutaneous Tissue) exposed. There is no tunneling or undermining noted. There is a small amount of serosanguineous drainage noted. The wound margin is distinct with the outline attached to the wound base. There is medium (34-66%) pink granulation within the wound bed. There is a medium (34-66%) amount of necrotic tissue within the wound bed including Adherent Slough. Assessment Active Problems ICD-10 Chronic venous hypertension (idiopathic) with inflammation of right lower extremity Non-pressure chronic ulcer of other part of left lower leg with other specified severity Chronic venous hypertension (idiopathic) with inflammation of left lower extremity Non-pressure chronic ulcer of other part of left foot with fat layer exposed Procedures Wound #5 Pre-procedure diagnosis of Wound #5 is a Venous Leg Ulcer located on the Left,Lateral Lower Leg .Severity of Tissue Pre Debridement is: Fat layer exposed. There was a Excisional Skin/Subcutaneous Tissue Debridement with a total area of 4.5 sq cm performed by Ricard Dillon., MD. With the following instrument(s): Curette to remove Viable and Non-Viable tissue/material. Material removed includes Callus, Subcutaneous Tissue, Slough, Skin: Epidermis, and Fibrin/Exudate after achieving pain control using Other (benzocaine 20% spray). No specimens were taken. A time out was conducted at 09:05, prior to the start of the procedure. A Minimum amount of bleeding was controlled with Pressure. The procedure was tolerated well with a pain level of 8 throughout and a pain level of 4 following the procedure. Post Debridement Measurements: 4.5cm length x 1.1cm width x 0.1cm depth; 0.389cm^3 volume. Character  of Wound/Ulcer Post Debridement requires further debridement. Severity of Tissue Post Debridement is: Fat layer exposed. Post procedure Diagnosis Wound #5: Same as Pre-Procedure Pre-procedure diagnosis of Wound #5 is a Venous Leg Ulcer located on the Left,Lateral Lower Leg . There was a Three Layer Compression Therapy Procedure by Deon Pilling, RN. Post procedure Diagnosis Wound #5: Same as Pre-Procedure Wound #8 Pre-procedure diagnosis of Wound #8 is a Venous Leg Ulcer located on the Left,Proximal,Lateral Lower Leg . There was a Three Layer Compression Therapy Procedure by Deon Pilling, RN. Post procedure Diagnosis Wound #8: Same as Pre-Procedure Plan Follow-up Appointments: Return Appointment in 1 week. Dressing Change Frequency: Wound #5 Left,Lateral Lower Leg: Do not change entire dressing for one week. Skin Barriers/Peri-Wound Care: Moisturizing lotion TCA Cream or Ointment - to red/inflamed areas Wound Cleansing: May shower with protection. - use cast protector Primary Wound Dressing: Wound #5 Left,Lateral Lower Leg: Calcium Alginate with Silver Wound #8 Left,Proximal,Lateral Lower Leg: Calcium Alginate with Silver Secondary Dressing: Wound #5 Left,Lateral Lower Leg: Dry Gauze ABD pad Edema Control: 3 Layer Compression System - Left Lower Extremity Avoid standing for long periods of time Elevate legs to the level of the heart or above for 30 minutes daily and/or when sitting, a frequency of: - throughout the day. Exercise regularly Support Garment 20-30 mm/Hg pressure to: - patient to apply farrow wrap 4000 to right leg. Apply in the morning and remove at night. Off-Loading: Open toe  surgical shoe to: - left foot The following medication(s) was prescribed: benzocaine topical 20 % aerosol aerosol topical for prior to debridement was prescribed at facility 1. I continued with the silver alginate although for not in any better shape next week consider Hydrofera Blue 2. No  evidence of infection 3. The patient stated she was unwell over the weekend with nausea. She is not eating and drinking well. She thinks it is due to a recently started cardiac medication for arrhythmias. I told her she should contact her doctor Electronic Signature(s) Signed: 09/15/2020 4:54:01 PM By: Linton Ham MD Entered By: Linton Ham on 09/15/2020 09:35:43 -------------------------------------------------------------------------------- SuperBill Details Patient Name: Date of Service: Tracy Sermon. 09/15/2020 Medical Record Number: [id number] Patient Account Number: [id number] Date of Birth/Sex: Treating RN: August 19, 1933 (86 y.o. [name] Primary Care Provider: Lajean Manes T Other Clinician: Referring Provider: Treating Provider/Extender: Evelena Peat in Treatment: 38 Diagnosis Coding ICD-10 Codes Code Description I87.321 Chronic venous hypertension (idiopathic) with inflammation of right lower extremity L97.828 Non-pressure chronic ulcer of other part of left lower leg with other specified severity I87.322 Chronic venous hypertension (idiopathic) with inflammation of left lower extremity L97.522 Non-pressure chronic ulcer of other part of left foot with fat layer exposed Facility Procedures The patient participates with Medicare or their insurance follows the Medicare Facility Guidelines: CPT4 Code Description Modifier Quantity [id number] 11042 - DEB SUBQ TISSUE 20 SQ CM/< 1 ICD-10 Diagnosis Description L97.828 Non-pressure chronic ulcer of  other part of left lower leg with other specified severity Physician Procedures : CPT4 Code Description Modifier [id number] 11042 - WC PHYS SUBQ TISS 20 SQ CM ICD-10 Diagnosis Description L97.828 Non-pressure chronic ulcer of other part of left lower leg with other specified severity Quantity: 1 Electronic Signature(s) Signed: 09/15/2020 4:54:01 PM By: Linton Ham MD Entered By: Linton Ham  on 09/15/2020 09:35:55

## 2020-09-15 NOTE — Telephone Encounter (Signed)
  Pt c/o medication issue:  1. Name of Medication:   dronedarone (MULTAQ) 400 MG table   2. How are you currently taking this medication (dosage and times per day)? Pt stopped taking the medication Saturday   3. Are you having a reaction (difficulty breathing--STAT)? possibly  4. What is your medication issue? Pt is still nauseous and has had diarrhea 1x . Pt just states she is weak overall. Pt does not report any other symptoms   Pt would like to speak with Willeen Cass to discuss her symptoms

## 2020-09-16 NOTE — Addendum Note (Signed)
Addended by: Willeen Cass A on: 09/16/2020 09:32 AM   Modules accepted: Orders

## 2020-09-16 NOTE — Telephone Encounter (Signed)
Patient is following up for advisement.

## 2020-09-16 NOTE — Telephone Encounter (Signed)
Returned call to Pt.  Advised Pt to stop taking dronedarone.  Pt will see Dr. Lovena Le next week to determine next steps.

## 2020-09-22 ENCOUNTER — Encounter (HOSPITAL_BASED_OUTPATIENT_CLINIC_OR_DEPARTMENT_OTHER): Payer: Medicare Other | Admitting: Internal Medicine

## 2020-09-22 DIAGNOSIS — L97828 Non-pressure chronic ulcer of other part of left lower leg with other specified severity: Secondary | ICD-10-CM | POA: Diagnosis not present

## 2020-09-22 NOTE — Progress Notes (Signed)
CASSANDR, CEDERBERG ([id number]) Visit Report for 09/22/2020 HPI Details Patient Name: Date of Service: [place], [name] 09/22/2020 8:30 A M Medical Record Number: [id number] Patient Account Number: [id number] Date of Birth/Sex: Treating RN: [birth year] (86 y.o. [name] Primary Care Provider: Patrina Levering Other Clinician: Referring Provider: Treating Provider/Extender: Evelena Peat in Treatment: 39 History of Present Illness HPI Description: ADMISSION 12/20/2019 This is an 86 year old woman referred by her primary physician Dr. Felipa Eth for review of a wound on her right lateral lower leg. She was actually in this clinic on 2 separate occasions in 2010 and 2012 cared for by Dr. Sherilyn Cooter. At that point in time she had wounds on her right leg as well. She tells [place] to 1 month ago she noticed a scab building up on her right lateral lower leg this opened into a wound. There was no overt cause of this no trauma, no infection that she is aware of. She has a history of chronic venous insufficiency and wears compression stockings fairly religiously indeed she is done well over the last 8 years since she was last in this clinic. She has been applying Vaseline on this and a covering phone. This is not progressing towards healing. Past medical history; interstitial lung disease, chronic atrial fibrillation status post pacemaker, osteoarthritis of the left knee, left breast CA, chronic repeat venous insufficiency. She takes Eliquis for her atrial fibrillation stroke prophylaxis. ABI in our clinic was 0.99 on the right 1/15; superficial wound on the right lateral calf in the setting of severe acute skin changes from chronic venous insufficiency and lymphedema. We used Iodoflex last week she complained of a lot of pain 1/22; this is a small but difficult wound on the right lateral calf in the setting of severe chronic venous insufficiency and secondary lymphedema.  She continues to state the wounds things hurts when she is up on it but seems to be relieved by putting her leg up. I changed her to [facility] last week because the Iodoflex seem to be causing stinging. 1/29. This wound appears to be contracting somewhat. Changes of chronic venous insufficiency with secondary lymphedema 2/12; not as good as surface today and slightly bigger. I had to change her from Iodoflex to [facility] because of the stinging pain although she does not think it was any better on the [facility]. She comes into clinic today with a area on the medial left great toe. She said she noticed blood on her sock last Saturday. She had some form of bunion surgery by Dr. Ila Mcgill her podiatrist sometime in 2019 she said he "shave the area". I could not find his operative report although I did see reference to the bunion in the area. 2/19; somewhat improved wound on the right lateral lower leg however the wound she came in on the bunion of her left great toe is actually I think larger still somewhat inflamed. We have been using Iodoflex 2/26; not much improvement in either wound area which is the original venous wound on the right lateral lower leg and in the area on the bunion of her left great toe. This still looks somewhat inflamed and tender. We have been using Iodoflex with open much improvement. 3/4; in general both her wounds look better this includes the original venous insufficiency wound on the right lateral lower leg and the area on her tip of her bunion on the left great toe medial aspect of the MTP. Change  to [facility] last week 3/12; the patient still has a small geographic shaped wound on the right lateral lower leg. We have been using Hydrofera Blue but I changed her to endoform today. She also has the area in the tip of the bunion of the left great toe. We will try Hydrofera Blue here as well 3/19; the small geographic wound on the right lateral lower leg  has not filled in. There is some surrounding induration we have noticed this previously. This may be venous inflammation but I wonder about biopsying this if this is not closing up. The area over the left medial first MTP [bunion deformity] requires debridement. This is not closing in. I am not convinced she is offloading adequately 3/26; right lateral lower leg in the setting of severe venous insufficiency and also an area over the first MTP bunion deformity. No debridement is required. We have been using endoform 4/9; right lateral lower leg wound in the setting of severe venous insufficiency and also a refractory area over the first MTP bunion deformity. The area on the right lateral lower leg is just about closed there is still a minor open area here. She comes in today with a mirror image area on the left lateral lower leg. She says this started as a scab 2 weeks ago and is gradually morphed into an open wound very similar to what she has on the right leg. She has severe bilateral venous insufficiency with venous hypertension obvious from clinical exam. 4/16; her original wound the right lateral leg wound in the setting of chronic venous hypertension is a small open area but very small. We have been using endoform. Her wound over the left first MTP bunion deformity also appears to be small and closing in. Unfortunately she has a large relative area on the left lateral calf which was new last week. Very adherent debris over this we have been using Iodoflex however that may be contributing to the debris on the wound surface 4/23; the original wound is closed and the area on the left first metatarsal bunion deformity is also almost closed the new area from last week required debridement. She went for her reflux studies that did not take her lower extremity wraps off. This is not helpful. She did have significant reflux in the right common femoral vein. I am not going to press this issue further.  She clinically has severe venous hypertension 4/29; the original wound is closed on the right lateral lower calf. The area on the left first metatarsal/bunion deformity has a small slitlike opening that is still not closed. The real problem here is now wound on the left lateral calf which is necrotic and deep. We used Iodoflex on this wound last time. Silver alginate on the left first toe The patient has [place] wraps. We should be able to transition the right leg into compression stockings and were doing this today. 5/7; the original wound remains closed on the right lateral calf. The left first metatarsal head still is open. Deterioration in the wound which is the new wound from several weeks ago on the left lateral calf. The patient is not aware how this could have happened. We have been using Sorbact starting last week under 3 layer compression 5/14; the right lateral calf is closed the area on the first met head on the left is closed However the new area from 3 weeks ago on the left lateral calf continues to expand. There is raised nodular tissue around  the wound necrotic debris on the surface. Circumference of the wound also looks necrotic. It looks as though there is involvement of the tissue under the skin around the large area of this wound which looks threatened. She is complaining of pain. Culture of this wound I did last week showed rare coag negative staph variant I have never heard of although I am doubtful this has clinical significance 05/09/20-Patient returns with a left lateral calf wound looking about the same, the biopsies reviewed show no atypical features and consistent with trauma or pressure injury or stasis change, patient has appointment to be scheduled with vascular We are using silver alginate and 3 layer compression Patient was started on Trental by dermatology which she has been taking for a week now 6/4; left lateral calf wound. I reviewed the dermatology notes with Dr.  Martin Majestic from [facility] dermatology. 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 Put her on pentoxifylline noteworthy that she is on Eliquis as well. We have been using silver alginate under compression. She has an appointment for repeat vascular studies on 7/6. This gets back to the fact that they did not take the wrap off on the original studies that were done. I do not believe she has an arterial issue. 6/10; left lateral calf wound. I put her on Hydrofera Blue last week. Some improvement in the surface condition of the wound. She has repeat vascular studies/reflux studies on 7/6. We are trying to get Apligraf through [place] [employer]. Her husband states he looked on the website this is not going to be approved. I am really not sure if this is a "class effect" 6/18; somewhat surprisingly Apligraf was approved and 100% covered. Her husband was also quite shocked by this. Nevertheless we have her repeat vascular studies on 7/6 and I will not be able to apply this until this is done. We are using Hydrofera Blue to the wound on the left. Her original wound on the right lower leg has maintain closure using compression stocking 6/25; repeat vascular studies/venous on 7/6; we will apply the Apligraf I think that week. We are also going to work around a week's vacation they have the first week in August. 7/2; reflux studies on 7/6;. Likely place first Apligraf from the major wound area on 7/9; 7/9; patient was kindly seen by Dr. Donnetta Hutching of vein and vascular to review her circulation status with the refractory wounds in the left leg. At first wounds in this clinic were on the right leg and they heal she now has a juxta lite stocking. Dr. Donnetta Hutching thought she had venous stasis disease with venous ulcerations. He did not feel that there was anything that would suggest to expect approval with a blush ablation of her  saphenous vein the saphenous veins were felt to be relatively small in caliber. She was not felt to have an arterial issue Apligraf #1 apply today in the standard fashion 7/23; the patient came in today complaining of a lot of pain in the left lower leg where the wound is. We applied Apligraf 2 weeks ago I also increased her compression from 3-4 layer wondering about the venous hypertension. She arrives today with her original wound that we have been treating with Apligraf looking quite healthy although there is some surface slough. Problematically she has de-epithelialized and developed erythema below the wound towards the lateral malleolus itself. This is somewhat angry. I think this is venous  stasis loss of epithelialization. I do not believe this is infection . Apligraf #2 applied to the original wound area 8/9-Patient returns at 2 weeks for Apligraf #3, the left leg venous leg ulcer area had more slough this time 8/23 Apligraf #4 her wound areas are small however she is still complaining of pain. She has previously been seen by vein and vascular. She has not felt to have an arterial issue she was felt to have venous stasis disease. She originally came here with a wound on her right leg on the right lateral calf this healed out. She then developed a difficult area on the left. She saw dermatology wondered whether this could be a vasculopathy. I am not sure if she is still on pentoxifylline the dermatology suggested I will have to clarify this with 9/7; patient complains of unrelenting pain making her life miserable. I been using Apligraf to close her major wound down and the major wound has come down to 2 open areas which are small and granulated. However there is erythema both inferiorly and superiorly cratered thick skin with wound issues between these areas extending into the lateral calf. There is erythema here and tenderness. I have been managing this largely as this this was severe stasis  dermatitis. Vein and vascular did not feel that she would benefit from an ablation. She went to see [facility] dermatology some months ago who wondered about a vasculopathy and suggested a biopsy but I am reluctant to go forward with that here. The patient is on Eliquis. 9/13; the patient saw Dr. Ubaldo Glassing on 9/8; she noted superficial cutaneous breakdown located on her left inferior lateral posterior tibia. She does not have a specific diagnosis for the patient's ulcer. Wanted to rule out squamous cell carcinoma and pyoderma gangrenosum she did a punch biopsy that is still bleeding. They came back for a dressing change after that procedure. They have still not heard the results of the biopsy. Was still using silver alginate as the primary dressing. She is tolerating the doxycycline although she had to take it with food 9/20; the biopsy that was done by dermatology/Dr. Ubaldo Glassing on 9/8 showed findings consistent with stasis dermatitis. I think we can put any thought of an alternative diagnosis to rest here. She is already seen vein and vascular today did not think she was a candidate for ablations. They felt she had adequate arterial flow. We have been using silver alginate and TCA I put her on doxycycline last time because of erythema around the wound which very well could be venous inflammation/stasis dermatitis but I had some concerns about cellulitis. Doxycycline being a good drug for this because of its anti-inflammatory as well as its antibiotic effects. We also received a note from the patient's cardiologist Dr. Darene Lamer aylor about interactions between doxycycline and propafenone. While I was aware of the potential interaction between propafenone and quinolones I was not aware of any interaction between doxycycline and propafenone. Nor can I find this interaction described. Nevertheless I will follow Dr. Tanna Furry expertise in this area. She was having nausea in any case and was about to stop it even without  cardiology advice. She was also in the ER because of bleeding from the biopsy site and in urgent care because of nausea 9/27 weekly follow-up. Area on the left lateral calf venous insufficiency wounds with at one point severe stasis dermatitis. She is actually doing quite nicely. She has 4 or 5 small open areas. Some of them with eschared surfaces which  I removed with a #5 curette. The skin around here looks excellent. Using silver alginate Liberal TCA under compression 10/4; left lateral calf venous insufficiency wounds. Using silver alginate 10/11; left lateral venous insufficiency wounds. She has 3 areas that we are watching now. These almost form a line. The most distal part is definitely still open. Superiorly the next area is eschared which does not look 100% closed where is the most proximal area appears closed. We are using silver alginate under compression. She has made a nice improvement. She tells me that she has bought a small exercise bike one of the portable ones that. She is using this and this actually may be helpful Electronic Signature(s) Signed: 09/22/2020 5:03:49 PM By: Baltazar Najjar MD Entered By: Baltazar Najjar on 09/22/2020 09:13:45 -------------------------------------------------------------------------------- Physical Exam Details Patient Name: Date of Service: Mauri Brooklyn J. 09/22/2020 8:30 A M Medical Record Number: [id number] Patient Account Number: [id number] Date of Birth/Sex: Treating RN: 11/18/1933 (86 y.o. Wynelle Link Primary Care Provider: Estrellita Ludwig Other Clinician: Referring Provider: Treating Provider/Extender: Addison Naegeli Weeks in Treatment: 39 Constitutional Sitting or standing Blood Pressure is within target range for patient.. Pulse regular and within target range for patient.Marland Kitchen Respirations regular, non-labored and within target range.. Temperature is normal and within the target range for the patient.Marland Kitchen Appears  in no distress. Cardiovascular Pedal pulses are palpable. We have good edema control. Psychiatric appears at normal baseline. Notes Wound exams; left lateral ankle and calf. There are now 3 open areas. The most distal area is still open. Next wound most proximally is closed partially eschared this does not look particularly healthy at this point I am not completely sure this is closed where is the most proximal area I think is closed there is no evidence of infection in any of these areas. We have made excellent progress Electronic Signature(s) Signed: 09/22/2020 5:03:49 PM By: Baltazar Najjar MD Entered By: Baltazar Najjar on 09/22/2020 09:16:12 -------------------------------------------------------------------------------- Physician Orders Details Patient Name: Date of Service: Nelda Marseille. 09/22/2020 8:30 A M Medical Record Number: [id number] Patient Account Number: [id number] Date of Birth/Sex: Treating RN: 01-06-1933 (86 y.o. Wynelle Link Primary Care Provider: Merlene Laughter T Other Clinician: Referring Provider: Treating Provider/Extender: Shella Spearing in Treatment: 58 Verbal / Phone Orders: No Diagnosis Coding ICD-10 Coding Code Description I87.321 Chronic venous hypertension (idiopathic) with inflammation of right lower extremity L97.828 Non-pressure chronic ulcer of other part of left lower leg with other specified severity I87.322 Chronic venous hypertension (idiopathic) with inflammation of left lower extremity L97.522 Non-pressure chronic ulcer of other part of left foot with fat layer exposed Follow-up Appointments Return Appointment in 1 week. Dressing Change Frequency Do not change entire dressing for one week. Skin Barriers/Peri-Wound Care Moisturizing lotion Wound Cleansing May shower with protection. - use cast protector Primary Wound Dressing Wound #5 Left,Lateral Lower Leg Calcium Alginate with Silver Wound #8  Left,Proximal,Lateral Lower Leg Calcium Alginate with Silver Secondary Dressing Wound #5 Left,Lateral Lower Leg Dry Gauze ABD pad Wound #8 Left,Proximal,Lateral Lower Leg Dry Gauze ABD pad Edema Control 3 Layer Compression System - Left Lower Extremity Avoid standing for long periods of time Elevate legs to the level of the heart or above for 30 minutes daily and/or when sitting, a frequency of: - throughout the day. Exercise regularly Support Garment 20-30 mm/Hg pressure to: - patient to apply farrow wrap 4000 to right leg. Apply in the morning and remove at night. Off-Loading  Open toe surgical shoe to: - left foot Electronic Signature(s) Signed: 09/22/2020 5:03:49 PM By: Linton Ham MD Signed: 09/22/2020 5:10:40 PM By: [name] RN, BSN Entered By: [name] on 09/22/2020 08:51:28 -------------------------------------------------------------------------------- Problem List Details Patient Name: Date of Service: [name], [place]. 09/22/2020 8:30 A M Medical Record Number: [id number] Patient Account Number: [id number] Date of Birth/Sex: Treating RN: 17-Oct-1933 (86 y.o. [name] Primary Care Provider: Lajean Manes T Other Clinician: Referring Provider: Treating Provider/Extender: Evelena Peat in Treatment: 39 Active Problems ICD-10 Encounter Code Description Active Date MDM Diagnosis I87.321 Chronic venous hypertension (idiopathic) with inflammation of right lower 12/20/2019 No Yes extremity L97.828 Non-pressure chronic ulcer of other part of left lower leg with other specified 03/21/2020 No Yes severity I87.322 Chronic venous hypertension (idiopathic) with inflammation of left lower 02/14/2020 No Yes extremity L97.522 Non-pressure chronic ulcer of other part of left foot with fat layer exposed 01/25/2020 No Yes Inactive Problems ICD-10 Code Description Active Date Inactive Date L97.812 Non-pressure chronic ulcer of other  part of right lower leg with fat layer exposed 12/20/2019 12/20/2019 Resolved Problems Electronic Signature(s) Signed: 09/22/2020 5:03:49 PM By: Linton Ham MD Entered By: Linton Ham on 09/22/2020 09:11:46 -------------------------------------------------------------------------------- Progress Note Details Patient Name: Date of Service: [name] 09/22/2020 8:30 A M Medical Record Number: [id number] Patient Account Number: [id number] Date of Birth/Sex: Treating RN: March 15, 1933 (86 y.o. [name] Primary Care Provider: Patrina Levering Other Clinician: Referring Provider: Treating Provider/Extender: Evelena Peat in Treatment: 39 Subjective History of Present Illness (HPI) ADMISSION 12/20/2019 This is an 86 year old woman referred by her primary physician Dr. Felipa Eth for review of a wound on her right lateral lower leg. She was actually in this clinic on 2 separate occasions in 2010 and 2012 cared for by Dr. Sherilyn Cooter. At that point in time she had wounds on her right leg as well. She tells [place] to 1 month ago she noticed a scab building up on her right lateral lower leg this opened into a wound. There was no overt cause of this no trauma, no infection that she is aware of. She has a history of chronic venous insufficiency and wears compression stockings fairly religiously indeed she is done well over the last 8 years since she was last in this clinic. She has been applying Vaseline on this and a covering phone. This is not progressing towards healing. Past medical history; interstitial lung disease, chronic atrial fibrillation status post pacemaker, osteoarthritis of the left knee, left breast CA, chronic repeat venous insufficiency. She takes Eliquis for her atrial fibrillation stroke prophylaxis. ABI in our clinic was 0.99 on the right 1/15; superficial wound on the right lateral calf in the setting of severe acute skin changes from chronic  venous insufficiency and lymphedema. We used Iodoflex last week she complained of a lot of pain 1/22; this is a small but difficult wound on the right lateral calf in the setting of severe chronic venous insufficiency and secondary lymphedema. She continues to state the wounds things hurts when she is up on it but seems to be relieved by putting her leg up. I changed her to [facility] last week because the Iodoflex seem to be causing stinging. 1/29. This wound appears to be contracting somewhat. Changes of chronic venous insufficiency with secondary lymphedema 2/12; not as good as surface today and slightly bigger. I had to change her from Iodoflex to [facility] because of the stinging  pain although she does not think it was any better on the [facility]. ooShe comes into clinic today with a area on the medial left great toe. She said she noticed blood on her sock last Saturday. She had some form of bunion surgery by Dr. Ila Mcgill her podiatrist sometime in 2019 she said he "shave the area". I could not find his operative report although I did see reference to the bunion in the area. 2/19; somewhat improved wound on the right lateral lower leg however the wound she came in on the bunion of her left great toe is actually I think larger still somewhat inflamed. We have been using Iodoflex 2/26; not much improvement in either wound area which is the original venous wound on the right lateral lower leg and in the area on the bunion of her left great toe. This still looks somewhat inflamed and tender. We have been using Iodoflex with open much improvement. 3/4; in general both her wounds look better this includes the original venous insufficiency wound on the right lateral lower leg and the area on her tip of her bunion on the left great toe medial aspect of the MTP. Change to Hydrofera Blue last week 3/12; the patient still has a small geographic shaped wound on the right lateral lower  leg. We have been using Hydrofera Blue but I changed her to endoform today. She also has the area in the tip of the bunion of the left great toe. We will try Hydrofera Blue here as well 3/19; the small geographic wound on the right lateral lower leg has not filled in. There is some surrounding induration we have noticed this previously. This may be venous inflammation but I wonder about biopsying this if this is not closing up. The area over the left medial first MTP [bunion deformity] requires debridement. This is not closing in. I am not convinced she is offloading adequately 3/26; right lateral lower leg in the setting of severe venous insufficiency and also an area over the first MTP bunion deformity. No debridement is required. We have been using endoform 4/9; right lateral lower leg wound in the setting of severe venous insufficiency and also a refractory area over the first MTP bunion deformity. The area on the right lateral lower leg is just about closed there is still a minor open area here. She comes in today with a mirror image area on the left lateral lower leg. She says this started as a scab 2 weeks ago and is gradually morphed into an open wound very similar to what she has on the right leg. She has severe bilateral venous insufficiency with venous hypertension obvious from clinical exam. 4/16; her original wound the right lateral leg wound in the setting of chronic venous hypertension is a small open area but very small. We have been using endoform. Her wound over the left first MTP bunion deformity also appears to be small and closing in. Unfortunately she has a large relative area on the left lateral calf which was new last week. Very adherent debris over this we have been using Iodoflex however that may be contributing to the debris on the wound surface 4/23; the original wound is closed and the area on the left first metatarsal bunion deformity is also almost closed the new area  from last week required debridement. She went for her reflux studies that did not take her lower extremity wraps off. This is not helpful. She did have significant reflux in  the right common femoral vein. I am not going to press this issue further. She clinically has severe venous hypertension 4/29; the original wound is closed on the right lateral lower calf. The area on the left first metatarsal/bunion deformity has a small slitlike opening that is still not closed. The real problem here is now wound on the left lateral calf which is necrotic and deep. We used Iodoflex on this wound last time. Silver alginate on the left first toe The patient has [place] wraps. We should be able to transition the right leg into compression stockings and were doing this today. 5/7; the original wound remains closed on the right lateral calf. The left first metatarsal head still is open. Deterioration in the wound which is the new wound from several weeks ago on the left lateral calf. The patient is not aware how this could have happened. We have been using Sorbact starting last week under 3 layer compression 5/14; the right lateral calf is closed the area on the first met head on the left is closed However the new area from 3 weeks ago on the left lateral calf continues to expand. There is raised nodular tissue around the wound necrotic debris on the surface. Circumference of the wound also looks necrotic. It looks as though there is involvement of the tissue under the skin around the large area of this wound which looks threatened. She is complaining of pain. Culture of this wound I did last week showed rare coag negative staph variant I have never heard of although I am doubtful this has clinical significance 05/09/20-Patient returns with a left lateral calf wound looking about the same, the biopsies reviewed show no atypical features and consistent with trauma or pressure injury or stasis change, patient has  appointment to be scheduled with vascular We are using silver alginate and 3 layer compression Patient was started on Trental by dermatology which she has been taking for a week now 6/4; left lateral calf wound. I reviewed the dermatology notes with Dr. Martin Majestic from [facility] dermatology. 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 Put her on pentoxifylline noteworthy that she is on Eliquis as well. We have been using silver alginate under compression. She has an appointment for repeat vascular studies on 7/6. This gets back to the fact that they did not take the wrap off on the original studies that were done. I do not believe she has an arterial issue. 6/10; left lateral calf wound. I put her on Hydrofera Blue last week. Some improvement in the surface condition of the wound. She has repeat vascular studies/reflux studies on 7/6. We are trying to get Apligraf through [place] [employer]. Her husband states he looked on the website this is not going to be approved. I am really not sure if this is a "class effect" 6/18; somewhat surprisingly Apligraf was approved and 100% covered. Her husband was also quite shocked by this. Nevertheless we have her repeat vascular studies on 7/6 and I will not be able to apply this until this is done. We are using Hydrofera Blue to the wound on the left. Her original wound on the right lower leg has maintain closure using compression stocking 6/25; repeat vascular studies/venous on 7/6; we will apply the Apligraf I think that week. We are also going to work around a week's vacation they have the first week in August. 7/2; reflux studies on 7/6;. Likely place  first Apligraf from the major wound area on 7/9; 7/9; patient was kindly seen by Dr. Donnetta Hutching of vein and vascular to review her circulation status with the refractory wounds in the left leg. At first wounds in this clinic  were on the right leg and they heal she now has a juxta lite stocking. Dr. Donnetta Hutching thought she had venous stasis disease with venous ulcerations. He did not feel that there was anything that would suggest to expect approval with a blush ablation of her saphenous vein the saphenous veins were felt to be relatively small in caliber. She was not felt to have an arterial issue Apligraf #1 apply today in the standard fashion 7/23; the patient came in today complaining of a lot of pain in the left lower leg where the wound is. We applied Apligraf 2 weeks ago I also increased her compression from 3-4 layer wondering about the venous hypertension. She arrives today with her original wound that we have been treating with Apligraf looking quite healthy although there is some surface slough. Problematically she has de-epithelialized and developed erythema below the wound towards the lateral malleolus itself. This is somewhat angry. I think this is venous stasis loss of epithelialization. I do not believe this is infection . Apligraf #2 applied to the original wound area 8/9-Patient returns at 2 weeks for Apligraf #3, the left leg venous leg ulcer area had more slough this time 8/23 Apligraf #4 her wound areas are small however she is still complaining of pain. She has previously been seen by vein and vascular. She has not felt to have an arterial issue she was felt to have venous stasis disease. She originally came here with a wound on her right leg on the right lateral calf this healed out. She then developed a difficult area on the left. She saw dermatology wondered whether this could be a vasculopathy. I am not sure if she is still on pentoxifylline the dermatology suggested I will have to clarify this with 9/7; patient complains of unrelenting pain making her life miserable. I been using Apligraf to close her major wound down and the major wound has come down to 2 open areas which are small and granulated.  However there is erythema both inferiorly and superiorly cratered thick skin with wound issues between these areas extending into the lateral calf. There is erythema here and tenderness. I have been managing this largely as this this was severe stasis dermatitis. Vein and vascular did not feel that she would benefit from an ablation. She went to see [facility] dermatology some months ago who wondered about a vasculopathy and suggested a biopsy but I am reluctant to go forward with that here. The patient is on Eliquis. 9/13; the patient saw Dr. Ubaldo Glassing on 9/8; she noted superficial cutaneous breakdown located on her left inferior lateral posterior tibia. She does not have a specific diagnosis for the patient's ulcer. Wanted to rule out squamous cell carcinoma and pyoderma gangrenosum she did a punch biopsy that is still bleeding. They came back for a dressing change after that procedure. They have still not heard the results of the biopsy. Was still using silver alginate as the primary dressing. She is tolerating the doxycycline although she had to take it with food 9/20; the biopsy that was done by dermatology/Dr. Ubaldo Glassing on 9/8 showed findings consistent with stasis dermatitis. I think we can put any thought of an alternative diagnosis to rest here. She is already seen vein and vascular today did  not think she was a candidate for ablations. They felt she had adequate arterial flow. We have been using silver alginate and TCA I put her on doxycycline last time because of erythema around the wound which very well could be venous inflammation/stasis dermatitis but I had some concerns about cellulitis. Doxycycline being a good drug for this because of its anti-inflammatory as well as its antibiotic effects. We also received a note from the patient's cardiologist Dr. Darene Lamer aylor about interactions between doxycycline and propafenone. While I was aware of the potential interaction between propafenone and quinolones  I was not aware of any interaction between doxycycline and propafenone. Nor can I find this interaction described. Nevertheless I will follow Dr. Tanna Furry expertise in this area. She was having nausea in any case and was about to stop it even without cardiology advice. She was also in the ER because of bleeding from the biopsy site and in urgent care because of nausea 9/27 weekly follow-up. Area on the left lateral calf venous insufficiency wounds with at one point severe stasis dermatitis. She is actually doing quite nicely. She has 4 or 5 small open areas. Some of them with eschared surfaces which I removed with a #5 curette. The skin around here looks excellent. Using silver alginate Liberal TCA under compression 10/4; left lateral calf venous insufficiency wounds. Using silver alginate 10/11; left lateral venous insufficiency wounds. She has 3 areas that we are watching now. These almost form a line. The most distal part is definitely still open. Superiorly the next area is eschared which does not look 100% closed where is the most proximal area appears closed. We are using silver alginate under compression. She has made a nice improvement. She tells me that she has bought a small exercise bike one of the portable ones that. She is using this and this actually may be helpful Objective Constitutional Sitting or standing Blood Pressure is within target range for patient.. Pulse regular and within target range for patient.Marland Kitchen Respirations regular, non-labored and within target range.. Temperature is normal and within the target range for the patient.Marland Kitchen Appears in no distress. Vitals Time Taken: 8:41 AM, Height: 69 in, Weight: 163 lbs, BMI: 24.1, Temperature: 97.6 F, Pulse: 65 bpm, Respiratory Rate: 18 breaths/min, Blood Pressure: 129/81 mmHg. Cardiovascular Pedal pulses are palpable. We have good edema control. Psychiatric appears at normal baseline. General Notes: Wound exams; left lateral  ankle and calf. There are now 3 open areas. The most distal area is still open. Next wound most proximally is closed partially eschared this does not look particularly healthy at this point I am not completely sure this is closed where is the most proximal area I think is closed there is no evidence of infection in any of these areas. We have made excellent progress Integumentary (Hair, Skin) Wound #5 status is Open. Original cause of wound was Gradually Appeared. The wound is located on the Left,Lateral Lower Leg. The wound measures 1.5cm length x 1cm width x 0.2cm depth; 1.178cm^2 area and 0.236cm^3 volume. There is Fat Layer (Subcutaneous Tissue) exposed. There is no tunneling or undermining noted. There is a medium amount of serosanguineous drainage noted. The wound margin is distinct with the outline attached to the wound base. There is medium (34-66%) red, pink granulation within the wound bed. There is a medium (34-66%) amount of necrotic tissue within the wound bed including Adherent Slough. Wound #8 status is Open. Original cause of wound was Gradually Appeared. The wound is located on  the Left,Proximal,Lateral Lower Leg. The wound measures 0.5cm length x 0.6cm width x 0.1cm depth; 0.236cm^2 area and 0.024cm^3 volume. There is Fat Layer (Subcutaneous Tissue) exposed. There is no tunneling or undermining noted. There is a small amount of serosanguineous drainage noted. The wound margin is distinct with the outline attached to the wound base. There is medium (34-66%) pink granulation within the wound bed. There is a medium (34-66%) amount of necrotic tissue within the wound bed including Adherent Slough. Assessment Active Problems ICD-10 Chronic venous hypertension (idiopathic) with inflammation of right lower extremity Non-pressure chronic ulcer of other part of left lower leg with other specified severity Chronic venous hypertension (idiopathic) with inflammation of left lower  extremity Non-pressure chronic ulcer of other part of left foot with fat layer exposed Procedures Wound #5 Pre-procedure diagnosis of Wound #5 is a Venous Leg Ulcer located on the Left,Lateral Lower Leg . There was a Three Layer Compression Therapy Procedure by [name], RN. Post procedure Diagnosis Wound #5: Same as Pre-Procedure Wound #8 Pre-procedure diagnosis of Wound #8 is a Venous Leg Ulcer located on the Left,Proximal,Lateral Lower Leg . There was a Three Layer Compression Therapy Procedure by [name], RN. Post procedure Diagnosis Wound #8: Same as Pre-Procedure Plan Follow-up Appointments: Return Appointment in 1 week. Dressing Change Frequency: Do not change entire dressing for one week. Skin Barriers/Peri-Wound Care: Moisturizing lotion Wound Cleansing: May shower with protection. - use cast protector Primary Wound Dressing: Wound #5 Left,Lateral Lower Leg: Calcium Alginate with Silver Wound #8 Left,Proximal,Lateral Lower Leg: Calcium Alginate with Silver Secondary Dressing: Wound #5 Left,Lateral Lower Leg: Dry Gauze ABD pad Wound #8 Left,Proximal,Lateral Lower Leg: Dry Gauze ABD pad Edema Control: 3 Layer Compression System - Left Lower Extremity Avoid standing for long periods of time Elevate legs to the level of the heart or above for 30 minutes daily and/or when sitting, a frequency of: - throughout the day. Exercise regularly Support Garment 20-30 mm/Hg pressure to: - patient to apply farrow wrap 4000 to right leg. Apply in the morning and remove at night. Off-Loading: Open toe surgical shoe to: - left foot 1. The patient has really done nicely. She still has 1 definitely open wound and perhaps the one just proximal to this. 2. At some point she will require debridement of these areas although I wanted to see what another week would bring under compression. 3. No evidence of infection in either area. We have good edema control 4. Continue silver  alginate Electronic Signature(s) Signed: 09/22/2020 5:03:49 PM By: Linton Ham MD Entered By: Linton Ham on 09/22/2020 09:17:03 -------------------------------------------------------------------------------- SuperBill Details Patient Name: Date of Service: [name] 09/22/2020 Medical Record Number: [id number] Patient Account Number: [id number] Date of Birth/Sex: Treating RN: Oct 22, 1933 (86 y.o. [name] Primary Care Provider: Lajean Manes T Other Clinician: Referring Provider: Treating Provider/Extender: Evelena Peat in Treatment: 39 Diagnosis Coding ICD-10 Codes Code Description [phone] Chronic venous hypertension (idiopathic) with inflammation of right lower extremity L97.828 Non-pressure chronic ulcer of other part of left lower leg with other specified severity I87.322 Chronic venous hypertension (idiopathic) with inflammation of left lower extremity L97.522 Non-pressure chronic ulcer of other part of left foot with fat layer exposed Facility Procedures The patient participates with Medicare or their insurance follows the Medicare Facility Guidelines: CPT4 Code Description Modifier Quantity [id number] (Facility Use Only) [phone] - [place] [facility] Physician Procedures : CPT4 Code Description Modifier [id number] - WC PHYS LEVEL  3 - EST PT ICD-10 Diagnosis Description I87.321 Chronic venous hypertension (idiopathic) with inflammation of right lower extremity L97.828 Non-pressure chronic ulcer of other part of left  lower leg with other specified severity Quantity: 1 Electronic Signature(s) Signed: 09/22/2020 5:03:49 PM By: Linton Ham MD Entered By: Linton Ham on 09/22/2020 09:17:23

## 2020-09-23 ENCOUNTER — Ambulatory Visit (INDEPENDENT_AMBULATORY_CARE_PROVIDER_SITE_OTHER): Payer: Medicare Other | Admitting: Internal Medicine

## 2020-09-23 ENCOUNTER — Encounter: Payer: Self-pay | Admitting: Internal Medicine

## 2020-09-23 ENCOUNTER — Other Ambulatory Visit: Payer: Self-pay

## 2020-09-23 VITALS — BP 128/72 | HR 85 | Ht 68.0 in | Wt 152.6 lb

## 2020-09-23 DIAGNOSIS — Z950 Presence of cardiac pacemaker: Secondary | ICD-10-CM | POA: Diagnosis not present

## 2020-09-23 DIAGNOSIS — I5032 Chronic diastolic (congestive) heart failure: Secondary | ICD-10-CM | POA: Diagnosis not present

## 2020-09-23 DIAGNOSIS — I493 Ventricular premature depolarization: Secondary | ICD-10-CM | POA: Diagnosis not present

## 2020-09-23 DIAGNOSIS — I482 Chronic atrial fibrillation, unspecified: Secondary | ICD-10-CM

## 2020-09-23 MED ORDER — METOPROLOL SUCCINATE ER 25 MG PO TB24: 25.0000 mg | ORAL_TABLET | Freq: Every day | ORAL | 11 refills | Status: DC

## 2020-09-23 NOTE — Progress Notes (Signed)
[name], [name] ([id number]) Visit Report for 09/22/2020 Arrival Information Details Patient Name: Date of Service: [name] 09/22/2020 8:30 A M Medical Record Number: [id number] Patient Account Number: [id number] Date of Birth/Sex: Treating RN: 1933-06-15 (86 y.o. [name] Primary Care Daymian Lill: Lajean Manes T Other Clinician: Referring Nimrit Kehres: Treating Lasya Vetter/Extender: Evelena Peat in Treatment: 52 Visit Information History Since Last Visit All ordered tests and consults were completed: No Patient Arrived: Ambulatory Added or deleted any medications: No Arrival Time: 08:26 Any new allergies or adverse reactions: No Accompanied By: self Had a fall or experienced change in No Transfer Assistance: None activities of daily living that may affect Patient Identification Verified: Yes risk of falls: Secondary Verification Process Completed: Yes Signs or symptoms of abuse/neglect since last visito No Patient Requires Transmission-Based Precautions: No Hospitalized since last visit: No Patient Has Alerts: Yes Implantable device outside of the clinic excluding No Patient Alerts: Patient on Blood Thinner cellular tissue based products placed in the center Right ABI:0.99 since last visit: Has Dressing in Place as Prescribed: Yes Has Compression in Place as Prescribed: Yes Pain Present Now: No Electronic Signature(s) Signed: 09/23/2020 5:49:00 PM By: Carlene Coria RN Entered By: Carlene Coria on 09/22/2020 08:41:36 -------------------------------------------------------------------------------- Compression Therapy Details Patient Name: Date of Service: [name], [name] 09/22/2020 8:30 A M Medical Record Number: [id number] Patient Account Number: [id number] Date of Birth/Sex: Treating RN: 1933/03/05 (86 y.o. [name] Primary Care Antoine Fiallos: Patrina Levering Other Clinician: Referring Shimika Ames: Treating Laruth Hanger/Extender: Evelena Peat in Treatment: 39 Compression Therapy Performed for Wound Assessment: Wound #5 Left,Lateral Lower Leg Performed By: Clinician [name], RN Compression Type: Three Layer Post Procedure Diagnosis Same as Pre-procedure Electronic Signature(s) Signed: 09/22/2020 5:10:40 PM By: [name] RN, BSN Entered By: [name] on 09/22/2020 08:51:56 -------------------------------------------------------------------------------- Compression Therapy Details Patient Name: Date of Service: [name]. 09/22/2020 8:30 A M Medical Record Number: [id number] Patient Account Number: [id number] Date of Birth/Sex: Treating RN: 01/10/1933 (86 y.o. [name] Primary Care Zakari Bathe: Patrina Levering Other Clinician: Referring Kiele Heavrin: Treating Codi Folkerts/Extender: Evelena Peat in Treatment: 39 Compression Therapy Performed for Wound Assessment: Wound #8 Left,Proximal,Lateral Lower Leg Performed By: Clinician [name], RN Compression Type: Three Layer Post Procedure Diagnosis Same as Pre-procedure Electronic Signature(s) Signed: 09/22/2020 5:10:40 PM By: [name] RN, BSN Entered By: [name] on 09/22/2020 08:51:56 -------------------------------------------------------------------------------- Encounter Discharge Information Details Patient Name: Date of Service: [name], [name] 09/22/2020 8:30 A M Medical Record Number: [id number] Patient Account Number: [id number] Date of Birth/Sex: Treating RN: 1933/12/01 (86 y.o. [name] Primary Care Tniyah Nakagawa: Patrina Levering Other Clinician: Referring Saudia Smyser: Treating Diontay Rosencrans/Extender: Evelena Peat in Treatment: 35 Encounter Discharge Information Items Discharge Condition: Stable Ambulatory Status: Ambulatory Discharge Destination: Home Transportation: Private Auto Accompanied By: self Schedule Follow-up  Appointment: Yes Clinical Summary of Care: Electronic Signature(s) Signed: 09/22/2020 5:08:17 PM By: Deon Pilling Entered By: Deon Pilling on 09/22/2020 09:03:48 -------------------------------------------------------------------------------- Lower Extremity Assessment Details Patient Name: Date of Service: [name] 09/22/2020 8:30 A M Medical Record Number: [id number] Patient Account Number: [id number] Date of Birth/Sex: Treating RN: 1933/07/24 (86 y.o. [name] Primary Care Jedrek Dinovo: Lajean Manes T Other Clinician: Referring Dominik Yordy: Treating Laurabeth Yip/Extender: Venita Lick T Weeks in Treatment: 39 Edema Assessment Assessed: [Left: No] [Right: No] Edema: [Left: N] [Right: o] Calf Left: Right: Point of Measurement: 41 cm  From Medial Instep 29 cm Ankle Left: Right: Point of Measurement: 14 cm From Medial Instep 19 cm Electronic Signature(s) Signed: 09/23/2020 5:49:00 PM By: Carlene Coria RN Entered By: Carlene Coria on 09/22/2020 08:42:48 -------------------------------------------------------------------------------- Multi Wound Chart Details Patient Name: Date of Service: [name]. 09/22/2020 8:30 A M Medical Record Number: [id number] Patient Account Number: [id number] Date of Birth/Sex: Treating RN: 04-May-1933 (87 y.o. [name] Primary Care Galileo Colello: Lajean Manes T Other Clinician: Referring Kian Ottaviano: Treating Vayda Dungee/Extender: [name] in Treatment: 39 Vital Signs Height(in): 69 Pulse(bpm): 65 Weight(lbs): 163 Blood Pressure(mmHg): 129/81 Body Mass Index(BMI): 24 Temperature(F): 97.6 Respiratory Rate(breaths/min): 18 Photos: [5:No Photos Left, Lateral Lower Leg] [8:No Photos Left, Proximal, Lateral Lower Leg] [N/A:N/A N/A] Wound Location: [5:Gradually Appeared] [8:Gradually Appeared] [N/A:N/A] Wounding Event: [5:Venous Leg Ulcer] [8:Venous Leg Ulcer] [N/A:N/A] Primary  Etiology: [5:Cataracts, Anemia, Arrhythmia,] [8:Cataracts, Anemia, Arrhythmia,] [N/A:N/A] Comorbid History: [5:Congestive Heart Failure, Hypertension, Peripheral Venous Disease, Received Radiation 03/21/2020] [8:Congestive Heart Failure, Hypertension, Peripheral Venous Disease, Received Radiation 09/15/2020] [N/A:N/A] Date Acquired: [5:26] [8:1] [N/A:N/A] Weeks of Treatment: [5:Open] [8:Open] [N/A:N/A] Wound Status: [5:Yes] [8:No] [N/A:N/A] Clustered Wound: [5:2] [8:N/A] [N/A:N/A] Clustered Quantity: [5:1.5x1x0.2] [8:0.5x0.6x0.1] [N/A:N/A] Measurements L x W x D (cm) [5:1.178] [8:0.236] [N/A:N/A] A (cm) : rea [5:0.236] [8:0.024] [N/A:N/A] Volume (cm) : [5:-134.20%] [8:-100.00%] [N/A:N/A] % Reduction in Area: [5:-372.00%] [8:0.00%] [N/A:N/A] % Reduction in Volume: [5:Full Thickness Without Exposed] [8:Full Thickness Without Exposed] [N/A:N/A] Classification: [5:Support Structures Medium] [8:Support Structures Small] [N/A:N/A] Exudate Amount: [5:Serosanguineous] [8:Serosanguineous] [N/A:N/A] Exudate Type: [5:red, brown] [8:red, brown] [N/A:N/A] Exudate Color: [5:Distinct, outline attached] [8:Distinct, outline attached] [N/A:N/A] Wound Margin: [5:Medium (34-66%)] [8:Medium (34-66%)] [N/A:N/A] Granulation Amount: [5:Red, Pink] [8:Pink] [N/A:N/A] Granulation Quality: [5:Medium (34-66%)] [8:Medium (34-66%)] [N/A:N/A] Necrotic Amount: [5:Fat Layer (Subcutaneous Tissue): Yes Fat Layer (Subcutaneous Tissue): Yes N/A] Exposed Structures: [5:Fascia: No Tendon: No Muscle: No Joint: No Bone: No Medium (34-66%)] [8:Fascia: No Tendon: No Muscle: No Joint: No Bone: No Small (1-33%)] [N/A:N/A] Epithelialization: [5:Compression Therapy] [8:Compression Therapy] [N/A:N/A] Procedures Performed: Treatment Notes Wound #5 (Left, Lateral Lower Leg) [5:1. Cleanse With Wound Cleanser Soap and water 2. Periwound Care Moisturizing lotion 3. Primary Dressing Applied Calcium Alginate Ag 4. Secondary Dressing Dry Gauze  6. Support Layer Applied 3 layer compression wrap] Notes netting. Wound #8 (Left, Proximal, Lateral Lower Leg) 1. Cleanse With Wound Cleanser Soap and water 2. Periwound Care Moisturizing lotion 3. Primary Dressing Applied Calcium Alginate Ag 4. Secondary Dressing Dry Gauze 6. Support Layer Applied 3 layer compression wrap Notes netting. Electronic Signature(s) Signed: 09/22/2020 5:03:49 PM By: Linton Ham MD Signed: 09/22/2020 5:10:40 PM By: [name] RN, BSN Entered By: Linton Ham on 09/22/2020 09:11:54 -------------------------------------------------------------------------------- Multi-Disciplinary Care Plan Details Patient Name: Date of Service: [name], [place]. 09/22/2020 8:30 A M Medical Record Number: [id number] Patient Account Number: [id number] Date of Birth/Sex: Treating RN: 1933/03/04 (86 y.o. [name] Primary Care Dearl Rudden: Patrina Levering Other Clinician: Referring Ahna Konkle: Treating Josel Keo/Extender: [name] in Treatment: 39 Active Inactive Wound/Skin Impairment Nursing Diagnoses: Knowledge deficit related to ulceration/compromised skin integrity Goals: Patient/caregiver will verbalize understanding of skin care regimen Date Initiated: 12/20/2019 Target Resolution Date: 10/24/2020 Goal Status: Active Interventions: Assess patient/caregiver ability to perform ulcer/skin care regimen upon admission and as needed Provide education on ulcer and skin care Treatment Activities: Skin care regimen initiated : 12/20/2019 Topical wound management initiated : 12/20/2019 Notes: Electronic Signature(s) Signed: 09/22/2020 5:10:40 PM By: [name] RN, BSN Entered By: [name] on 09/22/2020 08:48:42 --------------------------------------------------------------------------------  Pain Assessment Details Patient Name: Date of Service: [name], [name] 09/22/2020 8:30 A M Medical Record Number:  [id number] Patient Account Number: [id number] Date of Birth/Sex: Treating RN: Feb 13, 1933 (86 y.o. [name] Primary Care Julies Carmickle: Lajean Manes T Other Clinician: Referring Lemarcus Baggerly: Treating Jyles Sontag/Extender: Evelena Peat in Treatment: 39 Active Problems Location of Pain Severity and Description of Pain Patient Has Paino Yes Site Locations With Dressing Change: Yes Duration of the Pain. Constant / Intermittento Intermittent How Long Does it Lasto Hours: Minutes: 15 Rate the pain. Current Pain Level: 3 Worst Pain Level: 5 Least Pain Level: 0 Tolerable Pain Level: 5 Character of Pain Describe the Pain: Burning Pain Management and Medication Current Pain Management: Medication: Yes Cold Application: No Rest: Yes Massage: No Activity: No T.E.N.S.: No Heat Application: No Leg drop or elevation: No Is the Current Pain Management Adequate: Inadequate How does your wound impact your activities of daily livingo Sleep: Yes Bathing: No Appetite: Yes Relationship With Others: No Bladder Continence: No Emotions: No Bowel Continence: No Work: No Toileting: No Drive: No Dressing: No Hobbies: No Electronic Signature(s) Signed: 09/23/2020 5:49:00 PM By: Carlene Coria RN Entered By: Carlene Coria on 09/22/2020 08:42:37 -------------------------------------------------------------------------------- Patient/Caregiver Education Details Patient Name: Date of Service: [name] 10/11/2021andnbsp8:30 A M Medical Record Number: [id number] Patient Account Number: [id number] Date of Birth/Gender: Treating RN: [birth year] (86 y.o. [name] Primary Care Physician: Lajean Manes T Other Clinician: Referring Physician: Treating Physician/Extender: Evelena Peat in Treatment: 17 Education Assessment Education Provided To: Patient Education Topics Provided Wound/Skin Impairment: Methods:  Explain/Verbal Responses: State content correctly [employer]) Signed: 09/22/2020 5:10:40 PM By: [name] RN, BSN Entered By: [name] on 09/22/2020 08:48:52 -------------------------------------------------------------------------------- Wound Assessment Details Patient Name: Date of Service: [name] J. 09/22/2020 8:30 A M Medical Record Number: [id number] Patient Account Number: [id number] Date of Birth/Sex: Treating RN: 12-Jul-1933 (86 y.o. [name] Primary Care Dorothy Landgrebe: Lajean Manes T Other Clinician: Referring [place] Lasseter: Treating Tomeeka Plaugher/Extender: [name] in Treatment: 39 Wound Status Wound Number: 5 Primary Venous Leg Ulcer Etiology: Wound Location: Left, Lateral Lower Leg Wound Open Wounding Event: Gradually Appeared Status: Date Acquired: 03/21/2020 Comorbid Cataracts, Anemia, Arrhythmia, Congestive Heart Failure, Weeks Of Treatment: 26 History: Hypertension, Peripheral Venous Disease, Received Radiation Clustered Wound: Yes Photos Photo Uploaded By: Mikeal Hawthorne on 09/23/2020 13:57:24 Wound Measurements Length: (cm) 1.5 Width: (cm) 1 Depth: (cm) 0.2 Clustered Quantity: 2 Area: (cm) 1. Volume: (cm) 0. % Reduction in Area: -134.2% % Reduction in Volume: -372% Epithelialization: Medium (34-66%) Tunneling: No 178 Undermining: No 236 Wound Description Classification: Full Thickness Without Exposed Support Str Wound Margin: Distinct, outline attached Exudate Amount: Medium Exudate Type: Serosanguineous Exudate Color: red, brown uctures Foul Odor After Cleansing: No Slough/Fibrino Yes Wound Bed Granulation Amount: Medium (34-66%) Exposed Structure Granulation Quality: Red, Pink Fascia Exposed: No Necrotic Amount: Medium (34-66%) Fat Layer (Subcutaneous Tissue) Exposed: Yes Necrotic Quality: Adherent Slough Tendon Exposed: No Muscle Exposed: No Joint Exposed: No Bone Exposed:  No Treatment Notes Wound #5 (Left, Lateral Lower Leg) 1. Cleanse With Wound Cleanser Soap and water 2. Periwound Care Moisturizing lotion 3. Primary Dressing Applied Calcium Alginate Ag 4. Secondary Dressing Dry Gauze 6. Support Layer Applied 3 layer compression wrap Notes netting. Electronic Signature(s) Signed: 09/23/2020 5:49:00 PM By: Carlene Coria RN Entered By: Carlene Coria on 09/22/2020 08:43:47 -------------------------------------------------------------------------------- Wound Assessment Details Patient Name: Date of Service: [name], [place].  09/22/2020 8:30 A M Medical Record Number: [id number] Patient Account Number: [id number] Date of Birth/Sex: Treating RN: 06-Jun-1933 (86 y.o. [name] Primary Care Oley Lahaie: Other Clinician: Lajean Manes T Referring Deadrian Toya: Treating Sevag Shearn/Extender: [name] in Treatment: 39 Wound Status Wound Number: 8 Primary Venous Leg Ulcer Etiology: Wound Location: Left, Proximal, Lateral Lower Leg Wound Open Wounding Event: Gradually Appeared Status: Date Acquired: 09/15/2020 Comorbid Cataracts, Anemia, Arrhythmia, Congestive Heart Failure, Weeks Of Treatment: 1 History: Hypertension, Peripheral Venous Disease, Received Radiation Clustered Wound: No Photos Photo Uploaded By: Mikeal Hawthorne on 09/23/2020 13:57:04 Wound Measurements Length: (cm) 0.5 Width: (cm) 0.6 Depth: (cm) 0.1 Area: (cm) 0.236 Volume: (cm) 0.024 % Reduction in Area: -100% % Reduction in Volume: 0% Epithelialization: Small (1-33%) Tunneling: No Undermining: No Wound Description Classification: Full Thickness Without Exposed Support Structures Wound Margin: Distinct, outline attached Exudate Amount: Small Exudate Type: Serosanguineous Exudate Color: red, brown Foul Odor After Cleansing: No Slough/Fibrino Yes Wound Bed Granulation Amount: Medium (34-66%) Exposed Structure Granulation Quality: Pink Fascia  Exposed: No Necrotic Amount: Medium (34-66%) Fat Layer (Subcutaneous Tissue) Exposed: Yes Necrotic Quality: Adherent Slough Tendon Exposed: No Muscle Exposed: No Joint Exposed: No Bone Exposed: No Treatment Notes Wound #8 (Left, Proximal, Lateral Lower Leg) 1. Cleanse With Wound Cleanser Soap and water 2. Periwound Care Moisturizing lotion 3. Primary Dressing Applied Calcium Alginate Ag 4. Secondary Dressing Dry Gauze 6. Support Layer Applied 3 layer compression wrap Notes netting. Electronic Signature(s) Signed: 09/23/2020 5:49:00 PM By: Carlene Coria RN Signed: 09/23/2020 5:49:00 PM By: Carlene Coria RN Entered By: Carlene Coria on 09/22/2020 08:43:57 -------------------------------------------------------------------------------- [place] Details Patient Name: Date of Service: [name], [place]. 09/22/2020 8:30 A M Medical Record Number: [id number] Patient Account Number: [id number] Date of Birth/Sex: Treating RN: March 10, 1933 (86 y.o. [name] Primary Care Lorelei Heikkila: Lajean Manes T Other Clinician: Referring Myrtis Maille: Treating Maclaine Ahola/Extender: Evelena Peat in Treatment: 39 Vital Signs Time Taken: 08:41 Temperature (F): 97.6 Height (in): 69 Pulse (bpm): 65 Weight (lbs): 163 Respiratory Rate (breaths/min): 18 Body Mass Index (BMI): 24.1 Blood Pressure (mmHg): 129/81 Reference Range: 80 - 120 mg / dl Electronic Signature(s) Signed: 09/23/2020 5:49:00 PM By: Carlene Coria RN Entered By: Carlene Coria on 09/22/2020 08:42:03

## 2020-09-23 NOTE — Patient Instructions (Addendum)
Medication Instructions:  Your physician has recommended you make the following change in your medication:   1.  Start taking Toprol XL 25 mg- Take one tablet by mouth daily   Labwork: None ordered.  Testing/Procedures: None ordered.  Follow-Up:  October 17, 2020 at 8:00 am you will come to the [facility] office for an EKG and blood pressure check.   Your physician wants you to follow-up in: 3 months with Dr. Lovena Le.     December 31, 2019 at 3:00 pm at the [facility] office.   Remote monitoring is used to monitor your Pacemaker from home. This monitoring reduces the number of office visits required to check your device to one time per year. It allows [place] to keep an eye on the functioning of your device to ensure it is working properly. You are scheduled for a device check from home on 11/25/2020. You may send your transmission at any time that day. If you have a wireless device, the transmission will be sent automatically. After your physician reviews your transmission, you will receive a postcard with your next transmission date.  Any Other Special Instructions Will Be Listed Below (If Applicable).  If you need a refill on your cardiac medications before your next appointment, please call your pharmacy.

## 2020-09-23 NOTE — Progress Notes (Signed)
HPI [name] returns today for followup. She is a pleasant 86 yo woman with a h/o atrial flutter, s/p ablation, uncontrolled atrial fib s/p AV node ablation and His bundle PPM, and now symptomatic PVC's. She has tried multaq and propafenone and was intolerant of both. She has not had syncope, or chest pain though she seems a little bit weaker.  Allergies  Allergen Reactions  . Adhesive [Tape] Rash    Including EKG electrodes and the like.  . Codeine Other (See Comments)    unknown  . Latex Itching  . Percodan [Oxycodone-Aspirin] Nausea And Vomiting    Nausea      Current Outpatient Medications  Medication Sig Dispense Refill  . antiseptic oral rinse (BIOTENE) LIQD 15 mLs by Mouth Rinse route as needed for dry mouth.    . Carboxymethylcellul-Glycerin (LUBRICATING EYE DROPS OP) Apply 1 drop to eye daily as needed (dry eyes).    . Cyanocobalamin (B-12) 1000 MCG TABS Take 1 tablet by mouth daily.    Marland Kitchen ELIQUIS 5 MG TABS tablet TAKE 1 TABLET BY MOUTH  TWICE DAILY 180 tablet 1  . hydrochlorothiazide (HYDRODIURIL) 25 MG tablet Take 1 tablet (25 mg total) by mouth daily. 90 tablet 3  . HYDROcodone-acetaminophen (NORCO) 10-325 MG tablet Take 1 tablet by mouth 3 (three) times daily as needed for pain.    Marland Kitchen levothyroxine (SYNTHROID) 100 MCG tablet Take 100 mcg by mouth daily before breakfast.    . losartan (COZAAR) 100 MG tablet Take 100 mg by mouth daily.    Marland Kitchen MYRBETRIQ 50 MG TB24 tablet Take 50 mg by mouth daily.    . raNITIdine HCl (WAL-ZAN 75 PO) Take 1 tablet by mouth daily as needed.     . traMADol (ULTRAM) 50 MG tablet Take 50 mg by mouth 3 (three) times daily as needed.    . triamcinolone cream (KENALOG) 0.1 % Apply 1 application topically daily as needed.     . Vitamin D, Ergocalciferol, (DRISDOL) 50000 units CAPS capsule Take 50,000 Units by mouth every 7 (seven) days.    . metoprolol succinate (TOPROL XL) 25 MG 24 hr tablet Take 1 tablet (25 mg total) by mouth daily. 30  tablet 11   No current facility-administered medications for this visit.     Past Medical History:  Diagnosis Date  . Allergic rhinitis   . Anemia   . Breast cancer, left breast ([place]) 01/15/15   Invasive Mammary  . Chronic venous insufficiency   . Hematuria    negative workup - Dr. Reece Agar  . Hypertension   . Migraine headache   . MVP (mitral valve prolapse)   . PAF (paroxysmal atrial fibrillation) ([place])   . SVT (supraventricular tachycardia) ([place]) 06/2010   s/p AVNRT ablation  . Wears glasses     ROS:   All systems reviewed and negative except as noted in the HPI.   Past Surgical History:  Procedure Laterality Date  . A-FLUTTER ABLATION N/A 02/04/2017   Procedure: A-Flutter Ablation;  Surgeon: Evans Lance, MD;  Location: [facility];  Service: Cardiovascular;  Laterality: N/A;  . ABDOMINAL HYSTERECTOMY    . AV NODE ABLATION N/A 08/17/2017   Procedure: AV Node Ablation;  Surgeon: Evans Lance, MD;  Location: [facility];  Service: Cardiovascular;  Laterality: N/A;  . BREAST LUMPECTOMY WITH RADIOACTIVE SEED LOCALIZATION Left 02/14/2015   Procedure: BREAST LUMPECTOMY WITH RADIOACTIVE SEED LOCALIZATION;  Surgeon: Alphonsa Overall, MD;  Location: [facility]  [place];  Service: General;  Laterality: Left;  . BREAST SURGERY  1990   rt br bx  . CARDIAC CATHETERIZATION  2011   ablasion  . CARDIOVERSION N/A 08/10/2017   Procedure: CARDIOVERSION;  Surgeon: Jerline Pain, MD;  Location: [facility];  Service: Cardiovascular;  Laterality: N/A;  . COLONOSCOPY    . DILATION AND CURETTAGE OF UTERUS    . Left Breast Biopsy  01/15/15  . PACEMAKER IMPLANT N/A 08/17/2017   Procedure: Pacemaker Implant;  Surgeon: Evans Lance, MD;  Location: [facility];  Service: Cardiovascular;  Laterality: N/A;  . TUBAL LIGATION       Family History  Problem Relation Age of Onset  . Hypertension Mother   . Heart disease Mother   . Arthritis Mother   . Other Father         hardening of the arteries  . Heart Problems Sister        pacemaker  . Heart attack Brother   . Throat cancer Paternal Uncle   . Breast cancer Sister        early 42s  . Alzheimer's disease Sister   . Pulmonary disease Sister   . Breast cancer Daughter 68       Negative genetic testing   . Breast cancer Paternal Aunt        dx over 20  . Cancer Paternal Aunt        cancer in her leg  . Breast cancer Cousin        multiple paternal cousin     Social History   Socioeconomic History  . Marital status: Married    Spouse name: Not on file  . Number of children: 3  . Years of education: Not on file  . Highest education level: Not on file  Occupational History  . Not on file  Tobacco Use  . Smoking status: Never Smoker  . Smokeless tobacco: Never Used  . Tobacco comment: did smoke about 2cigs long time about 54 plus years ago  Vaping Use  . Vaping Use: Never used  Substance and Sexual Activity  . Alcohol use: Yes    Alcohol/week: 0.0 standard drinks    Comment: once a week  . Drug use: No  . Sexual activity: Not on file  Other Topics Concern  . Not on file  Social History Narrative   LIves with husband.    Retired.    Social Determinants of Health   Financial Resource Strain:   . Difficulty of Paying Living Expenses: Not on file  Food Insecurity:   . Worried About Charity fundraiser in the Last Year: Not on file  . Ran Out of Food in the Last Year: Not on file  Transportation Needs:   . Lack of Transportation (Medical): Not on file  . Lack of Transportation (Non-Medical): Not on file  Physical Activity:   . Days of Exercise per Week: Not on file  . Minutes of Exercise per Session: Not on file  Stress:   . Feeling of Stress : Not on file  Social Connections:   . Frequency of Communication with Friends and Family: Not on file  . Frequency of Social Gatherings with Friends and Family: Not on file  . Attends Religious Services: Not on file  . Active Member  of Clubs or Organizations: Not on file  . Attends Archivist Meetings: Not on file  . Marital Status: Not on file  Intimate  Partner Violence:   . Fear of Current or Ex-Partner: Not on file  . Emotionally Abused: Not on file  . Physically Abused: Not on file  . Sexually Abused: Not on file     BP 128/72   Pulse 85   Ht 5\' 8"  (1.727 m)   Wt 152 lb 9.6 oz (69.2 kg)   LMP  (LMP Unknown)   SpO2 97%   BMI 23.20 kg/m   Physical Exam:  Well appearing elderly woman, NAD HEENT: Unremarkable Neck:  6 cm JVD, no thyromegally Lymphatics:  No adenopathy Back:  No CVA tenderness Lungs:  Clear with no wheezes HEART:  Regular rate rhythm, no murmurs, no rubs, no clicks Abd:  soft, positive bowel sounds, no organomegally, no rebound, no guarding Ext:  2 plus pulses, no edema, no cyanosis, no clubbing Skin:  No rashes no nodules Neuro:  CN II through XII intact, motor grossly intact  EKG - atrial fib with ventricular pacing, PVC's  DEVICE  Normal device function.  See PaceArt for details.   Assess/Plan: 1. Symptomatic PVC's - I have discussed additional treatment options. She is still having PVC's and I will have her try toprol. She has tolerated this in the past. We will see her back in 3 months with a nurse visit in 3 weeks. 2. Nausea - this has resolved with stopping the propafenone. 3. HTN - her bp is well controlled. 4. Venous insufficiency - she has minimal swelling today. She will keep her legs elevated.

## 2020-09-29 ENCOUNTER — Other Ambulatory Visit: Payer: Self-pay

## 2020-09-29 ENCOUNTER — Encounter (HOSPITAL_BASED_OUTPATIENT_CLINIC_OR_DEPARTMENT_OTHER): Payer: Medicare Other | Admitting: Internal Medicine

## 2020-09-29 DIAGNOSIS — L97828 Non-pressure chronic ulcer of other part of left lower leg with other specified severity: Secondary | ICD-10-CM | POA: Diagnosis not present

## 2020-09-30 NOTE — Progress Notes (Signed)
[name], [name] ([id number]) Visit Report for 09/29/2020 HPI Details Patient Name: Date of Service: [place], [name] 09/29/2020 8:30 A M Medical Record Number: [id number] Patient Account Number: [id number] Date of Birth/Sex: Treating RN: 01-25-1933 (86 y.o. [name] Primary Care Provider: Patrina Levering Other Clinician: Referring Provider: Treating Provider/Extender: Evelena Peat in Treatment: 40 History of Present Illness HPI Description: ADMISSION 12/20/2019 This is an 86 year old woman referred by her primary physician Dr. Felipa Eth for review of a wound on her right lateral lower leg. She was actually in this clinic on 2 separate occasions in 2010 and 2012 cared for by Dr. Sherilyn Cooter. At that point in time she had wounds on her right leg as well. She tells [place] to 1 month ago she noticed a scab building up on her right lateral lower leg this opened into a wound. There was no overt cause of this no trauma, no infection that she is aware of. She has a history of chronic venous insufficiency and wears compression stockings fairly religiously indeed she is done well over the last 8 years since she was last in this clinic. She has been applying Vaseline on this and a covering phone. This is not progressing towards healing. Past medical history; interstitial lung disease, chronic atrial fibrillation status post pacemaker, osteoarthritis of the left knee, left breast CA, chronic repeat venous insufficiency. She takes Eliquis for her atrial fibrillation stroke prophylaxis. ABI in our clinic was 0.99 on the right 1/15; superficial wound on the right lateral calf in the setting of severe acute skin changes from chronic venous insufficiency and lymphedema. We used Iodoflex last week she complained of a lot of pain 1/22; this is a small but difficult wound on the right lateral calf in the setting of severe chronic venous insufficiency and secondary lymphedema.  She continues to state the wounds things hurts when she is up on it but seems to be relieved by putting her leg up. I changed her to [facility] last week because the Iodoflex seem to be causing stinging. 1/29. This wound appears to be contracting somewhat. Changes of chronic venous insufficiency with secondary lymphedema 2/12; not as good as surface today and slightly bigger. I had to change her from Iodoflex to [facility] because of the stinging pain although she does not think it was any better on the [facility]. She comes into clinic today with a area on the medial left great toe. She said she noticed blood on her sock last Saturday. She had some form of bunion surgery by Dr. Ila Mcgill her podiatrist sometime in 2019 she said he "shave the area". I could not find his operative report although I did see reference to the bunion in the area. 2/19; somewhat improved wound on the right lateral lower leg however the wound she came in on the bunion of her left great toe is actually I think larger still somewhat inflamed. We have been using Iodoflex 2/26; not much improvement in either wound area which is the original venous wound on the right lateral lower leg and in the area on the bunion of her left great toe. This still looks somewhat inflamed and tender. We have been using Iodoflex with open much improvement. 3/4; in general both her wounds look better this includes the original venous insufficiency wound on the right lateral lower leg and the area on her tip of her bunion on the left great toe medial aspect of the MTP. Change  to [facility] last week 3/12; the patient still has a small geographic shaped wound on the right lateral lower leg. We have been using Hydrofera Blue but I changed her to endoform today. She also has the area in the tip of the bunion of the left great toe. We will try Hydrofera Blue here as well 3/19; the small geographic wound on the right lateral lower leg  has not filled in. There is some surrounding induration we have noticed this previously. This may be venous inflammation but I wonder about biopsying this if this is not closing up. The area over the left medial first MTP [bunion deformity] requires debridement. This is not closing in. I am not convinced she is offloading adequately 3/26; right lateral lower leg in the setting of severe venous insufficiency and also an area over the first MTP bunion deformity. No debridement is required. We have been using endoform 4/9; right lateral lower leg wound in the setting of severe venous insufficiency and also a refractory area over the first MTP bunion deformity. The area on the right lateral lower leg is just about closed there is still a minor open area here. She comes in today with a mirror image area on the left lateral lower leg. She says this started as a scab 2 weeks ago and is gradually morphed into an open wound very similar to what she has on the right leg. She has severe bilateral venous insufficiency with venous hypertension obvious from clinical exam. 4/16; her original wound the right lateral leg wound in the setting of chronic venous hypertension is a small open area but very small. We have been using endoform. Her wound over the left first MTP bunion deformity also appears to be small and closing in. Unfortunately she has a large relative area on the left lateral calf which was new last week. Very adherent debris over this we have been using Iodoflex however that may be contributing to the debris on the wound surface 4/23; the original wound is closed and the area on the left first metatarsal bunion deformity is also almost closed the new area from last week required debridement. She went for her reflux studies that did not take her lower extremity wraps off. This is not helpful. She did have significant reflux in the right common femoral vein. I am not going to press this issue further.  She clinically has severe venous hypertension 4/29; the original wound is closed on the right lateral lower calf. The area on the left first metatarsal/bunion deformity has a small slitlike opening that is still not closed. The real problem here is now wound on the left lateral calf which is necrotic and deep. We used Iodoflex on this wound last time. Silver alginate on the left first toe The patient has [place] wraps. We should be able to transition the right leg into compression stockings and were doing this today. 5/7; the original wound remains closed on the right lateral calf. The left first metatarsal head still is open. Deterioration in the wound which is the new wound from several weeks ago on the left lateral calf. The patient is not aware how this could have happened. We have been using Sorbact starting last week under 3 layer compression 5/14; the right lateral calf is closed the area on the first met head on the left is closed However the new area from 3 weeks ago on the left lateral calf continues to expand. There is raised nodular tissue around  the wound necrotic debris on the surface. Circumference of the wound also looks necrotic. It looks as though there is involvement of the tissue under the skin around the large area of this wound which looks threatened. She is complaining of pain. Culture of this wound I did last week showed rare coag negative staph variant I have never heard of although I am doubtful this has clinical significance 05/09/20-Patient returns with a left lateral calf wound looking about the same, the biopsies reviewed show no atypical features and consistent with trauma or pressure injury or stasis change, patient has appointment to be scheduled with vascular We are using silver alginate and 3 layer compression Patient was started on Trental by dermatology which she has been taking for a week now 6/4; left lateral calf wound. I reviewed the dermatology notes with Dr.  Martin Majestic from [facility] dermatology. 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 Put her on pentoxifylline noteworthy that she is on Eliquis as well. We have been using silver alginate under compression. She has an appointment for repeat vascular studies on 7/6. This gets back to the fact that they did not take the wrap off on the original studies that were done. I do not believe she has an arterial issue. 6/10; left lateral calf wound. I put her on Hydrofera Blue last week. Some improvement in the surface condition of the wound. She has repeat vascular studies/reflux studies on 7/6. We are trying to get Apligraf through [place] [employer]. Her husband states he looked on the website this is not going to be approved. I am really not sure if this is a "class effect" 6/18; somewhat surprisingly Apligraf was approved and 100% covered. Her husband was also quite shocked by this. Nevertheless we have her repeat vascular studies on 7/6 and I will not be able to apply this until this is done. We are using Hydrofera Blue to the wound on the left. Her original wound on the right lower leg has maintain closure using compression stocking 6/25; repeat vascular studies/venous on 7/6; we will apply the Apligraf I think that week. We are also going to work around a week's vacation they have the first week in August. 7/2; reflux studies on 7/6;. Likely place first Apligraf from the major wound area on 7/9; 7/9; patient was kindly seen by Dr. Donnetta Hutching of vein and vascular to review her circulation status with the refractory wounds in the left leg. At first wounds in this clinic were on the right leg and they heal she now has a juxta lite stocking. Dr. Donnetta Hutching thought she had venous stasis disease with venous ulcerations. He did not feel that there was anything that would suggest to expect approval with a blush ablation of her  saphenous vein the saphenous veins were felt to be relatively small in caliber. She was not felt to have an arterial issue Apligraf #1 apply today in the standard fashion 7/23; the patient came in today complaining of a lot of pain in the left lower leg where the wound is. We applied Apligraf 2 weeks ago I also increased her compression from 3-4 layer wondering about the venous hypertension. She arrives today with her original wound that we have been treating with Apligraf looking quite healthy although there is some surface slough. Problematically she has de-epithelialized and developed erythema below the wound towards the lateral malleolus itself. This is somewhat angry. I think this is venous  stasis loss of epithelialization. I do not believe this is infection . Apligraf #2 applied to the original wound area 8/9-Patient returns at 2 weeks for Apligraf #3, the left leg venous leg ulcer area had more slough this time 8/23 Apligraf #4 her wound areas are small however she is still complaining of pain. She has previously been seen by vein and vascular. She has not felt to have an arterial issue she was felt to have venous stasis disease. She originally came here with a wound on her right leg on the right lateral calf this healed out. She then developed a difficult area on the left. She saw dermatology wondered whether this could be a vasculopathy. I am not sure if she is still on pentoxifylline the dermatology suggested I will have to clarify this with 9/7; patient complains of unrelenting pain making her life miserable. I been using Apligraf to close her major wound down and the major wound has come down to 2 open areas which are small and granulated. However there is erythema both inferiorly and superiorly cratered thick skin with wound issues between these areas extending into the lateral calf. There is erythema here and tenderness. I have been managing this largely as this this was severe stasis  dermatitis. Vein and vascular did not feel that she would benefit from an ablation. She went to see [facility] dermatology some months ago who wondered about a vasculopathy and suggested a biopsy but I am reluctant to go forward with that here. The patient is on Eliquis. 9/13; the patient saw Dr. Ubaldo Glassing on 9/8; she noted superficial cutaneous breakdown located on her left inferior lateral posterior tibia. She does not have a specific diagnosis for the patient's ulcer. Wanted to rule out squamous cell carcinoma and pyoderma gangrenosum she did a punch biopsy that is still bleeding. They came back for a dressing change after that procedure. They have still not heard the results of the biopsy. Was still using silver alginate as the primary dressing. She is tolerating the doxycycline although she had to take it with food 9/20; the biopsy that was done by dermatology/Dr. Ubaldo Glassing on 9/8 showed findings consistent with stasis dermatitis. I think we can put any thought of an alternative diagnosis to rest here. She is already seen vein and vascular today did not think she was a candidate for ablations. They felt she had adequate arterial flow. We have been using silver alginate and TCA I put her on doxycycline last time because of erythema around the wound which very well could be venous inflammation/stasis dermatitis but I had some concerns about cellulitis. Doxycycline being a good drug for this because of its anti-inflammatory as well as its antibiotic effects. We also received a note from the patient's cardiologist Dr. Darene Lamer aylor about interactions between doxycycline and propafenone. While I was aware of the potential interaction between propafenone and quinolones I was not aware of any interaction between doxycycline and propafenone. Nor can I find this interaction described. Nevertheless I will follow Dr. Tanna Furry expertise in this area. She was having nausea in any case and was about to stop it even without  cardiology advice. She was also in the ER because of bleeding from the biopsy site and in urgent care because of nausea 9/27 weekly follow-up. Area on the left lateral calf venous insufficiency wounds with at one point severe stasis dermatitis. She is actually doing quite nicely. She has 4 or 5 small open areas. Some of them with eschared surfaces which  I removed with a #5 curette. The skin around here looks excellent. Using silver alginate Liberal TCA under compression 10/4; left lateral calf venous insufficiency wounds. Using silver alginate 10/11; left lateral venous insufficiency wounds. She has 3 areas that we are watching now. These almost form a line. The most distal part is definitely still open. Superiorly the next area is eschared which does not look 100% closed where is the most proximal area appears closed. We are using silver alginate under compression. She has made a nice improvement. She tells me that she has bought a small exercise bike one of the portable ones that. She is using this and this actually may be helpful 10/18; left lateral venous insufficiency wounds. 1 small opening remains almost completely closed. We have been using silver alginate under compression Electronic Signature(s) Signed: 09/30/2020 4:22:08 PM By: Baltazar Najjar MD Entered By: Baltazar Najjar on 09/29/2020 09:46:53 -------------------------------------------------------------------------------- Chemical Cauterization Details Patient Name: Date of Service: [name]. 09/29/2020 8:30 A M Medical Record Number: [id number] Patient Account Number: [id number] Date of Birth/Sex: Treating RN: [birth year] (86 y.o. Wynelle Link Primary Care Provider: Estrellita Ludwig Other Clinician: Referring Provider: Treating Provider/Extender: Shella Spearing in Treatment: 40 Procedure Performed for: Wound #5 Left,Lateral Lower Leg Performed By: Physician Maxwell Caul., MD Post  Procedure Diagnosis Same as Pre-procedure Notes silver nitrate Electronic Signature(s) Signed: 09/30/2020 4:22:08 PM By: Baltazar Najjar MD Entered By: Baltazar Najjar on 09/29/2020 09:48:33 -------------------------------------------------------------------------------- Physical Exam Details Patient Name: Date of Service: [name]. 09/29/2020 8:30 A M Medical Record Number: [id number] Patient Account Number: [id number] Date of Birth/Sex: Treating RN: 12/30/1932 (86 y.o. Wynelle Link Primary Care Provider: Estrellita Ludwig Other Clinician: Referring Provider: Treating Provider/Extender: Addison Naegeli Weeks in Treatment: 40 Constitutional Patient is hypertensive.. Pulse regular and within target range for patient.Marland Kitchen Respirations regular, non-labored and within target range.. Temperature is normal and within the target range for the patient.Marland Kitchen Appears in no distress. Notes Wound exam; left lateral ankle and calf. Only 1 open area remains small hyper granulated area. I applied silver nitrate on this hopefully to allow this to be epithelialized. No evidence of infection Electronic Signature(s) Signed: 09/30/2020 4:22:08 PM By: Baltazar Najjar MD Entered By: Baltazar Najjar on 09/29/2020 09:47:35 -------------------------------------------------------------------------------- Physician Orders Details Patient Name: Date of Service: [name]. 09/29/2020 8:30 A M Medical Record Number: [id number] Patient Account Number: [id number] Date of Birth/Sex: Treating RN: [birth year] (86 y.o. Wynelle Link Primary Care Provider: Merlene Laughter T Other Clinician: Referring Provider: Treating Provider/Extender: Shella Spearing in Treatment: 67 Verbal / Phone Orders: No Diagnosis Coding ICD-10 Coding Code Description I87.321 Chronic venous hypertension (idiopathic) with inflammation of right lower extremity L97.828 Non-pressure  chronic ulcer of other part of left lower leg with other specified severity I87.322 Chronic venous hypertension (idiopathic) with inflammation of left lower extremity L97.522 Non-pressure chronic ulcer of other part of left foot with fat layer exposed Follow-up Appointments ppointment in 2 weeks. - MD visit Return A Nurse Visit: - 1 week for rewrap Dressing Change Frequency Wound #5 Left,Lateral Lower Leg Do not change entire dressing for one week. Skin Barriers/Peri-Wound Care Moisturizing lotion Wound Cleansing May shower with protection. - use cast protector Primary Wound Dressing Wound #5 Left,Lateral Lower Leg Calcium Alginate with Silver Secondary Dressing Wound #5 Left,Lateral Lower Leg Dry Gauze Edema Control 3 Layer Compression System - Left Lower Extremity Avoid standing for long  periods of time Elevate legs to the level of the heart or above for 30 minutes daily and/or when sitting, a frequency of: - throughout the day. Exercise regularly Support Garment 20-30 mm/Hg pressure to: - patient to apply farrow wrap 4000 to right leg. Apply in the morning and remove at night. Off-Loading Open toe surgical shoe to: - left foot Electronic Signature(s) Signed: 09/29/2020 5:42:40 PM By: [name] RN, BSN Signed: 09/30/2020 4:22:08 PM By: Linton Ham MD Entered By: [name] on 09/29/2020 09:24:28 -------------------------------------------------------------------------------- Problem List Details Patient Name: Date of Service: [name]. 09/29/2020 8:30 A M Medical Record Number: [id number] Patient Account Number: [id number] Date of Birth/Sex: Treating RN: 1933-11-21 (86 y.o. [name] Primary Care Provider: Lajean Manes T Other Clinician: Referring Provider: Treating Provider/Extender: Evelena Peat in Treatment: 22 Active Problems ICD-10 Encounter Code Description Active Date MDM Diagnosis I87.321 Chronic  venous hypertension (idiopathic) with inflammation of right lower 12/20/2019 No Yes extremity L97.828 Non-pressure chronic ulcer of other part of left lower leg with other specified 03/21/2020 No Yes severity I87.322 Chronic venous hypertension (idiopathic) with inflammation of left lower 02/14/2020 No Yes extremity L97.522 Non-pressure chronic ulcer of other part of left foot with fat layer exposed 01/25/2020 No Yes Inactive Problems ICD-10 Code Description Active Date Inactive Date L97.812 Non-pressure chronic ulcer of other part of right lower leg with fat layer exposed 12/20/2019 12/20/2019 Resolved Problems Electronic Signature(s) Signed: 09/30/2020 4:22:08 PM By: Linton Ham MD Entered By: Linton Ham on 09/29/2020 09:45:56 -------------------------------------------------------------------------------- Progress Note Details Patient Name: Date of Service: Tracy Banker J. 09/29/2020 8:30 A M Medical Record Number: [id number] Patient Account Number: [id number] Date of Birth/Sex: Treating RN: [birth year] (86 y.o. [name] Primary Care Provider: Patrina Levering Other Clinician: Referring Provider: Treating Provider/Extender: Evelena Peat in Treatment: 40 Subjective History of Present Illness (HPI) ADMISSION 12/20/2019 This is an 86 year old woman referred by her primary physician Dr. Felipa Eth for review of a wound on her right lateral lower leg. She was actually in this clinic on 2 separate occasions in 2010 and 2012 cared for by Dr. Sherilyn Cooter. At that point in time she had wounds on her right leg as well. She tells [place] to 1 month ago she noticed a scab building up on her right lateral lower leg this opened into a wound. There was no overt cause of this no trauma, no infection that she is aware of. She has a history of chronic venous insufficiency and wears compression stockings fairly religiously indeed she is done well over the last 8 years since she  was last in this clinic. She has been applying Vaseline on this and a covering phone. This is not progressing towards healing. Past medical history; interstitial lung disease, chronic atrial fibrillation status post pacemaker, osteoarthritis of the left knee, left breast CA, chronic repeat venous insufficiency. She takes Eliquis for her atrial fibrillation stroke prophylaxis. ABI in our clinic was 0.99 on the right 1/15; superficial wound on the right lateral calf in the setting of severe acute skin changes from chronic venous insufficiency and lymphedema. We used Iodoflex last week she complained of a lot of pain 1/22; this is a small but difficult wound on the right lateral calf in the setting of severe chronic venous insufficiency and secondary lymphedema. She continues to state the wounds things hurts when she is up on it but seems to be relieved by putting her leg up. I changed  her to [facility] last week because the Iodoflex seem to be causing stinging. 1/29. This wound appears to be contracting somewhat. Changes of chronic venous insufficiency with secondary lymphedema 2/12; not as good as surface today and slightly bigger. I had to change her from Iodoflex to [facility] because of the stinging pain although she does not think it was any better on the [facility]. ooShe comes into clinic today with a area on the medial left great toe. She said she noticed blood on her sock last Saturday. She had some form of bunion surgery by Dr. Ila Mcgill her podiatrist sometime in 2019 she said he "shave the area". I could not find his operative report although I did see reference to the bunion in the area. 2/19; somewhat improved wound on the right lateral lower leg however the wound she came in on the bunion of her left great toe is actually I think larger still somewhat inflamed. We have been using Iodoflex 2/26; not much improvement in either wound area which is the original venous wound  on the right lateral lower leg and in the area on the bunion of her left great toe. This still looks somewhat inflamed and tender. We have been using Iodoflex with open much improvement. 3/4; in general both her wounds look better this includes the original venous insufficiency wound on the right lateral lower leg and the area on her tip of her bunion on the left great toe medial aspect of the MTP. Change to Hydrofera Blue last week 3/12; the patient still has a small geographic shaped wound on the right lateral lower leg. We have been using Hydrofera Blue but I changed her to endoform today. She also has the area in the tip of the bunion of the left great toe. We will try Hydrofera Blue here as well 3/19; the small geographic wound on the right lateral lower leg has not filled in. There is some surrounding induration we have noticed this previously. This may be venous inflammation but I wonder about biopsying this if this is not closing up. The area over the left medial first MTP [bunion deformity] requires debridement. This is not closing in. I am not convinced she is offloading adequately 3/26; right lateral lower leg in the setting of severe venous insufficiency and also an area over the first MTP bunion deformity. No debridement is required. We have been using endoform 4/9; right lateral lower leg wound in the setting of severe venous insufficiency and also a refractory area over the first MTP bunion deformity. The area on the right lateral lower leg is just about closed there is still a minor open area here. She comes in today with a mirror image area on the left lateral lower leg. She says this started as a scab 2 weeks ago and is gradually morphed into an open wound very similar to what she has on the right leg. She has severe bilateral venous insufficiency with venous hypertension obvious from clinical exam. 4/16; her original wound the right lateral leg wound in the setting of chronic venous  hypertension is a small open area but very small. We have been using endoform. Her wound over the left first MTP bunion deformity also appears to be small and closing in. Unfortunately she has a large relative area on the left lateral calf which was new last week. Very adherent debris over this we have been using Iodoflex however that may be contributing to the debris on the wound  surface 4/23; the original wound is closed and the area on the left first metatarsal bunion deformity is also almost closed the new area from last week required debridement. She went for her reflux studies that did not take her lower extremity wraps off. This is not helpful. She did have significant reflux in the right common femoral vein. I am not going to press this issue further. She clinically has severe venous hypertension 4/29; the original wound is closed on the right lateral lower calf. The area on the left first metatarsal/bunion deformity has a small slitlike opening that is still not closed. The real problem here is now wound on the left lateral calf which is necrotic and deep. We used Iodoflex on this wound last time. Silver alginate on the left first toe The patient has [place] wraps. We should be able to transition the right leg into compression stockings and were doing this today. 5/7; the original wound remains closed on the right lateral calf. The left first metatarsal head still is open. Deterioration in the wound which is the new wound from several weeks ago on the left lateral calf. The patient is not aware how this could have happened. We have been using Sorbact starting last week under 3 layer compression 5/14; the right lateral calf is closed the area on the first met head on the left is closed However the new area from 3 weeks ago on the left lateral calf continues to expand. There is raised nodular tissue around the wound necrotic debris on the surface. Circumference of the wound also looks  necrotic. It looks as though there is involvement of the tissue under the skin around the large area of this wound which looks threatened. She is complaining of pain. Culture of this wound I did last week showed rare coag negative staph variant I have never heard of although I am doubtful this has clinical significance 05/09/20-Patient returns with a left lateral calf wound looking about the same, the biopsies reviewed show no atypical features and consistent with trauma or pressure injury or stasis change, patient has appointment to be scheduled with vascular We are using silver alginate and 3 layer compression Patient was started on Trental by dermatology which she has been taking for a week now 6/4; left lateral calf wound. I reviewed the dermatology notes with Dr. Martin Majestic from [facility] dermatology. 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 Put her on pentoxifylline noteworthy that she is on Eliquis as well. We have been using silver alginate under compression. She has an appointment for repeat vascular studies on 7/6. This gets back to the fact that they did not take the wrap off on the original studies that were done. I do not believe she has an arterial issue. 6/10; left lateral calf wound. I put her on Hydrofera Blue last week. Some improvement in the surface condition of the wound. She has repeat vascular studies/reflux studies on 7/6. We are trying to get Apligraf through [place] [employer]. Her husband states he looked on the website this is not going to be approved. I am really not sure if this is a "class effect" 6/18; somewhat surprisingly Apligraf was approved and 100% covered. Her husband was also quite shocked by this. Nevertheless we have her repeat vascular studies on 7/6 and I will not be able to apply this until this is done. We are using Hydrofera Blue to the wound on the  left. Her  original wound on the right lower leg has maintain closure using compression stocking 6/25; repeat vascular studies/venous on 7/6; we will apply the Apligraf I think that week. We are also going to work around a week's vacation they have the first week in August. 7/2; reflux studies on 7/6;. Likely place first Apligraf from the major wound area on 7/9; 7/9; patient was kindly seen by Dr. Donnetta Hutching of vein and vascular to review her circulation status with the refractory wounds in the left leg. At first wounds in this clinic were on the right leg and they heal she now has a juxta lite stocking. Dr. Donnetta Hutching thought she had venous stasis disease with venous ulcerations. He did not feel that there was anything that would suggest to expect approval with a blush ablation of her saphenous vein the saphenous veins were felt to be relatively small in caliber. She was not felt to have an arterial issue Apligraf #1 apply today in the standard fashion 7/23; the patient came in today complaining of a lot of pain in the left lower leg where the wound is. We applied Apligraf 2 weeks ago I also increased her compression from 3-4 layer wondering about the venous hypertension. She arrives today with her original wound that we have been treating with Apligraf looking quite healthy although there is some surface slough. Problematically she has de-epithelialized and developed erythema below the wound towards the lateral malleolus itself. This is somewhat angry. I think this is venous stasis loss of epithelialization. I do not believe this is infection . Apligraf #2 applied to the original wound area 8/9-Patient returns at 2 weeks for Apligraf #3, the left leg venous leg ulcer area had more slough this time 8/23 Apligraf #4 her wound areas are small however she is still complaining of pain. She has previously been seen by vein and vascular. She has not felt to have an arterial issue she was felt to have venous stasis disease.  She originally came here with a wound on her right leg on the right lateral calf this healed out. She then developed a difficult area on the left. She saw dermatology wondered whether this could be a vasculopathy. I am not sure if she is still on pentoxifylline the dermatology suggested I will have to clarify this with 9/7; patient complains of unrelenting pain making her life miserable. I been using Apligraf to close her major wound down and the major wound has come down to 2 open areas which are small and granulated. However there is erythema both inferiorly and superiorly cratered thick skin with wound issues between these areas extending into the lateral calf. There is erythema here and tenderness. I have been managing this largely as this this was severe stasis dermatitis. Vein and vascular did not feel that she would benefit from an ablation. She went to see [facility] dermatology some months ago who wondered about a vasculopathy and suggested a biopsy but I am reluctant to go forward with that here. The patient is on Eliquis. 9/13; the patient saw Dr. Ubaldo Glassing on 9/8; she noted superficial cutaneous breakdown located on her left inferior lateral posterior tibia. She does not have a specific diagnosis for the patient's ulcer. Wanted to rule out squamous cell carcinoma and pyoderma gangrenosum she did a punch biopsy that is still bleeding. They came back for a dressing change after that procedure. They have still not heard the results of the biopsy. Was still using silver alginate as the primary  dressing. She is tolerating the doxycycline although she had to take it with food 9/20; the biopsy that was done by dermatology/Dr. Ubaldo Glassing on 9/8 showed findings consistent with stasis dermatitis. I think we can put any thought of an alternative diagnosis to rest here. She is already seen vein and vascular today did not think she was a candidate for ablations. They felt she had adequate arterial flow. We have  been using silver alginate and TCA I put her on doxycycline last time because of erythema around the wound which very well could be venous inflammation/stasis dermatitis but I had some concerns about cellulitis. Doxycycline being a good drug for this because of its anti-inflammatory as well as its antibiotic effects. We also received a note from the patient's cardiologist Dr. Darene Lamer aylor about interactions between doxycycline and propafenone. While I was aware of the potential interaction between propafenone and quinolones I was not aware of any interaction between doxycycline and propafenone. Nor can I find this interaction described. Nevertheless I will follow Dr. Tanna Furry expertise in this area. She was having nausea in any case and was about to stop it even without cardiology advice. She was also in the ER because of bleeding from the biopsy site and in urgent care because of nausea 9/27 weekly follow-up. Area on the left lateral calf venous insufficiency wounds with at one point severe stasis dermatitis. She is actually doing quite nicely. She has 4 or 5 small open areas. Some of them with eschared surfaces which I removed with a #5 curette. The skin around here looks excellent. Using silver alginate Liberal TCA under compression 10/4; left lateral calf venous insufficiency wounds. Using silver alginate 10/11; left lateral venous insufficiency wounds. She has 3 areas that we are watching now. These almost form a line. The most distal part is definitely still open. Superiorly the next area is eschared which does not look 100% closed where is the most proximal area appears closed. We are using silver alginate under compression. She has made a nice improvement. She tells me that she has bought a small exercise bike one of the portable ones that. She is using this and this actually may be helpful 10/18; left lateral venous insufficiency wounds. 1 small opening remains almost completely closed. We have  been using silver alginate under compression Objective Constitutional Patient is hypertensive.. Pulse regular and within target range for patient.Marland Kitchen Respirations regular, non-labored and within target range.. Temperature is normal and within the target range for the patient.Marland Kitchen Appears in no distress. Vitals Time Taken: 8:37 AM, Height: 69 in, Source: Stated, Weight: 163 lbs, Source: Stated, BMI: 24.1, Temperature: 97.9 F, Pulse: 71 bpm, Respiratory Rate: 18 breaths/min, Blood Pressure: 164/82 mmHg. General Notes: Wound exam; left lateral ankle and calf. Only 1 open area remains small hyper granulated area. I applied silver nitrate on this hopefully to allow this to be epithelialized. No evidence of infection Integumentary (Hair, Skin) Wound #5 status is Open. Original cause of wound was Gradually Appeared. The wound is located on the Left,Lateral Lower Leg. The wound measures 0.9cm length x 0.4cm width x 0.1cm depth; 0.283cm^2 area and 0.028cm^3 volume. There is Fat Layer (Subcutaneous Tissue) exposed. There is no tunneling or undermining noted. There is a small amount of serosanguineous drainage noted. The wound margin is flat and intact. There is large (67-100%) red granulation within the wound bed. There is a small (1-33%) amount of necrotic tissue within the wound bed including Adherent Slough. Wound #8 status is Healed -  Epithelialized. Original cause of wound was Gradually Appeared. The wound is located on the Left,Proximal,Lateral Lower Leg. The wound measures 0cm length x 0cm width x 0cm depth; 0cm^2 area and 0cm^3 volume. There is no tunneling or undermining noted. There is a none present amount of drainage noted. The wound margin is distinct with the outline attached to the wound base. There is no granulation within the wound bed. There is no necrotic tissue within the wound bed. Assessment Active Problems ICD-10 Chronic venous hypertension (idiopathic) with inflammation of right lower  extremity Non-pressure chronic ulcer of other part of left lower leg with other specified severity Chronic venous hypertension (idiopathic) with inflammation of left lower extremity Non-pressure chronic ulcer of other part of left foot with fat layer exposed Procedures Wound #5 Pre-procedure diagnosis of Wound #5 is a Venous Leg Ulcer located on the Left,Lateral Lower Leg . There was a Three Layer Compression Therapy Procedure by [name], RN. Post procedure Diagnosis Wound #5: Same as Pre-Procedure Pre-procedure diagnosis of Wound #5 is a Venous Leg Ulcer located on the Left,Lateral Lower Leg . An Chemical Cauterization procedure was performed by Ricard Dillon., MD. Post procedure Diagnosis Wound #5: Same as Pre-Procedure Notes: silver nitrate Plan Follow-up Appointments: Return Appointment in 2 weeks. - MD visit Nurse Visit: - 1 week for rewrap Dressing Change Frequency: Wound #5 Left,Lateral Lower Leg: Do not change entire dressing for one week. Skin Barriers/Peri-Wound Care: Moisturizing lotion Wound Cleansing: May shower with protection. - use cast protector Primary Wound Dressing: Wound #5 Left,Lateral Lower Leg: Calcium Alginate with Silver Secondary Dressing: Wound #5 Left,Lateral Lower Leg: Dry Gauze Edema Control: 3 Layer Compression System - Left Lower Extremity Avoid standing for long periods of time Elevate legs to the level of the heart or above for 30 minutes daily and/or when sitting, a frequency of: - throughout the day. Exercise regularly Support Garment 20-30 mm/Hg pressure to: - patient to apply farrow wrap 4000 to right leg. Apply in the morning and remove at night. Off-Loading: Open toe surgical shoe to: - left foot 1. Still continue with the silver alginate under compression 2. Only a small hyper granulated wound remained which I applied silver nitrate to. 3. Hopefully closed by next week Electronic Signature(s) Signed: 09/30/2020 4:22:08 PM  By: Linton Ham MD Entered By: Linton Ham on 09/29/2020 09:48:54 -------------------------------------------------------------------------------- SuperBill Details Patient Name: Date of Service: [name]. 09/29/2020 Medical Record Number: [id number] Patient Account Number: [id number] Date of Birth/Sex: Treating RN: [id number] (86 y.o. [name] Primary Care Provider: Lajean Manes T Other Clinician: Referring Provider: Treating Provider/Extender: Evelena Peat in Treatment: 40 Diagnosis Coding ICD-10 Codes Code Description [phone] Chronic venous hypertension (idiopathic) with inflammation of right lower extremity L97.828 Non-pressure chronic ulcer of other part of left lower leg with other specified severity I87.322 Chronic venous hypertension (idiopathic) with inflammation of left lower extremity L97.522 Non-pressure chronic ulcer of other part of left foot with fat layer exposed Facility Procedures The patient participates with Medicare or their insurance follows the Medicare Facility Guidelines: CPT4 Code Description Modifier Quantity [phone] (Facility Use Only) [phone] - [place] 1 Physician Procedures : CPT4 Code Description Modifier [id number] - WC PHYS LEVEL 3 - EST PT ICD-10 Diagnosis Description I87.321 Chronic venous hypertension (idiopathic) with inflammation of right lower extremity L97.828 Non-pressure chronic ulcer of other part of left  lower leg with other specified severity Quantity: 1 Electronic Signature(s) Signed: 09/29/2020 5:42:40  PM By: [name] RN, BSN Signed: 09/30/2020 4:22:08 PM By: Linton Ham MD Entered By: [name] on 09/29/2020 13:18:17

## 2020-09-30 NOTE — Progress Notes (Signed)
[name], [name] ([id number]) Visit Report for 09/29/2020 Arrival Information Details Patient Name: Date of Service: [place], [name] 09/29/2020 8:30 A M Medical Record Number: [id number] Patient Account Number: [id number] Date of Birth/Sex: Treating RN: 11/03/1933 (86 y.o. [name] Primary Care Ean Gettel: Lajean Manes T Other Clinician: Referring Shahram Alexopoulos: Treating Aurelius Gildersleeve/Extender: Evelena Peat in Treatment: 24 Visit Information History Since Last Visit Added or deleted any medications: No Patient Arrived: Ambulatory Any new allergies or adverse reactions: No Arrival Time: 08:32 Had a fall or experienced change in No Accompanied By: self activities of daily living that may affect Transfer Assistance: None risk of falls: Patient Identification Verified: Yes Signs or symptoms of abuse/neglect since last visito No Secondary Verification Process Completed: Yes Hospitalized since last visit: No Patient Requires Transmission-Based Precautions: No Implantable device outside of the clinic excluding No Patient Has Alerts: Yes cellular tissue based products placed in the center Patient Alerts: Patient on Blood Thinner since last visit: Right ABI:0.99 Has Dressing in Place as Prescribed: Yes Has Compression in Place as Prescribed: Yes Pain Present Now: Yes Electronic Signature(s) Signed: 09/29/2020 5:10:38 PM By: [name] RN, BSN Entered By: [name] on 09/29/2020 08:34:34 -------------------------------------------------------------------------------- Compression Therapy Details Patient Name: Date of Service: [name]. 09/29/2020 8:30 A M Medical Record Number: [id number] Patient Account Number: [id number] Date of Birth/Sex: Treating RN: February 25, 1933 (86 y.o. [name] Primary Care Owen Pratte: Patrina Levering Other Clinician: Referring Mitra Duling: Treating Charolett Yarrow/Extender: [name] in  Treatment: 40 Compression Therapy Performed for Wound Assessment: Wound #5 Left,Lateral Lower Leg Performed By: Clinician [name], RN Compression Type: Three Layer Post Procedure Diagnosis Same as Pre-procedure Electronic Signature(s) Signed: 09/29/2020 5:42:40 PM By: [name] RN, BSN Entered By: [name] on 09/29/2020 09:25:03 -------------------------------------------------------------------------------- Encounter Discharge Information Details Patient Name: Date of Service: [name], [place]. 09/29/2020 8:30 A M Medical Record Number: [id number] Patient Account Number: [id number] Date of Birth/Sex: Treating RN: 11/08/1933 (86 y.o. [name] Primary Care Yehonatan Grandison: Patrina Levering Other Clinician: Referring Bethanee Redondo: Treating Mahamed Zalewski/Extender: Evelena Peat in Treatment: 53 Encounter Discharge Information Items Discharge Condition: Stable Ambulatory Status: Ambulatory Discharge Destination: Home Transportation: Private Auto Accompanied By: self Schedule Follow-up Appointment: Yes Clinical Summary of Care: Electronic Signature(s) Signed: 09/29/2020 5:10:30 PM By: Deon Pilling Entered By: Deon Pilling on 09/29/2020 09:35:37 -------------------------------------------------------------------------------- Lower Extremity Assessment Details Patient Name: Date of Service: [name], [name] 09/29/2020 8:30 A M Medical Record Number: [id number] Patient Account Number: [id number] Date of Birth/Sex: Treating RN: 04/17/1933 (86 y.o. [name] Primary Care Josip Merolla: Lajean Manes T Other Clinician: Referring Caedence Snowden: Treating Anaira Seay/Extender: [name] in Treatment: 40 Edema Assessment Assessed: [Left: No] [Right: No] Edema: [Left: N] [Right: o] Calf Left: Right: Point of Measurement: 41 cm From Medial Instep 32.2 cm Ankle Left: Right: Point of Measurement: 14 cm From Medial  Instep 19.3 cm Vascular Assessment Pulses: Dorsalis Pedis Palpable: [Left:Yes] Electronic Signature(s) Signed: 09/29/2020 5:10:38 PM By: [name] RN, BSN Entered By: [name] on 09/29/2020 08:49:17 -------------------------------------------------------------------------------- Multi Wound Chart Details Patient Name: Date of Service: [name]. 09/29/2020 8:30 A M Medical Record Number: [id number] Patient Account Number: [id number] Date of Birth/Sex: Treating RN: 03-08-1933 (86 y.o. [name] Primary Care Krysia Zahradnik: Patrina Levering Other Clinician: Referring Aleksa Catterton: Treating Shanise Balch/Extender: [name] in Treatment: 40 Vital Signs Height(in): 69 Pulse(bpm): 71 Weight(lbs): 163  Blood Pressure(mmHg): 164/82 Body Mass Index(BMI): 24 Temperature(F): 97.9 Respiratory Rate(breaths/min): 18 Photos: [5:No Photos Left, Lateral Lower Leg] [8:No Photos Left, Proximal, Lateral Lower Leg] [N/A:N/A N/A] Wound Location: [5:Gradually Appeared] [8:Gradually Appeared] [N/A:N/A] Wounding Event: [5:Venous Leg Ulcer] [8:Venous Leg Ulcer] [N/A:N/A] Primary Etiology: [5:Cataracts, Anemia, Arrhythmia,] [8:Cataracts, Anemia, Arrhythmia,] [N/A:N/A] Comorbid History: [5:Congestive Heart Failure, Hypertension, Peripheral Venous Disease, Received Radiation 03/21/2020] [8:Congestive Heart Failure, Hypertension, Peripheral Venous Disease, Received Radiation 09/15/2020] [N/A:N/A] Date Acquired: [5:27] [8:2] [N/A:N/A] Weeks of Treatment: [5:Open] [8:Healed - Epithelialized] [N/A:N/A] Wound Status: [5:Yes] [8:No] [N/A:N/A] Clustered Wound: [5:2] [8:N/A] [N/A:N/A] Clustered Quantity: [5:0.9x0.4x0.1] [8:0x0x0] [N/A:N/A] Measurements L x W x D (cm) [5:0.283] [8:0] [N/A:N/A] A (cm) : rea [5:0.028] [8:0] [N/A:N/A] Volume (cm) : [5:43.70%] [8:100.00%] [N/A:N/A] % Reduction in Area: [5:44.00%] [8:100.00%] [N/A:N/A] % Reduction in Volume: [5:Full  Thickness Without Exposed] [8:Full Thickness Without Exposed] [N/A:N/A] Classification: [5:Support Structures Small] [8:Support Structures None Present] [N/A:N/A] Exudate Amount: [5:Serosanguineous] [8:N/A] [N/A:N/A] Exudate Type: [5:red, brown] [8:N/A] [N/A:N/A] Exudate Color: [5:Flat and Intact] [8:Distinct, outline attached] [N/A:N/A] Wound Margin: [5:Large (67-100%)] [8:None Present (0%)] [N/A:N/A] Granulation Amount: [5:Red] [8:N/A] [N/A:N/A] Granulation Quality: [5:Small (1-33%)] [8:None Present (0%)] [N/A:N/A] Necrotic Amount: [5:Fat Layer (Subcutaneous Tissue): Yes Fascia: No] [N/A:N/A] Exposed Structures: [5:Fascia: No Tendon: No Muscle: No Joint: No Bone: No Medium (34-66%)] [8:Fat Layer (Subcutaneous Tissue): No Tendon: No Muscle: No Joint: No Bone: No Large (67-100%)] [N/A:N/A] Epithelialization: [5:Chemical Cauterization] [8:N/A] [N/A:N/A] Procedures Performed: [5:Compression Therapy] Treatment Notes Wound #5 (Left, Lateral Lower Leg) 1. Cleanse With Wound Cleanser Soap and water 2. Periwound Care Moisturizing lotion 3. Primary Dressing Applied Calcium Alginate Ag 4. Secondary Dressing Dry Gauze 6. Support Layer Applied 3 layer compression wrap Notes netting. Electronic Signature(s) Signed: 09/29/2020 5:42:40 PM By: [name] RN, BSN Signed: 09/30/2020 4:22:08 PM By: Linton Ham MD Entered By: Linton Ham on 09/29/2020 09:46:12 -------------------------------------------------------------------------------- Multi-Disciplinary Care Plan Details Patient Name: Date of Service: [place], [place]. 09/29/2020 8:30 A M Medical Record Number: [id number] Patient Account Number: [id number] Date of Birth/Sex: Treating RN: December 11, 1933 (86 y.o. [name] Primary Care Yaritsa Savarino: Patrina Levering Other Clinician: Referring Shmuel Girgis: Treating Jessel Gettinger/Extender: [name] in Treatment: 40 Active Inactive Wound/Skin  Impairment Nursing Diagnoses: Knowledge deficit related to ulceration/compromised skin integrity Goals: Patient/caregiver will verbalize understanding of skin care regimen Date Initiated: 12/20/2019 Target Resolution Date: 10/24/2020 Goal Status: Active Interventions: Assess patient/caregiver ability to perform ulcer/skin care regimen upon admission and as needed Provide education on ulcer and skin care Treatment Activities: Skin care regimen initiated : 12/20/2019 Topical wound management initiated : 12/20/2019 Notes: Electronic Signature(s) Signed: 09/29/2020 5:42:40 PM By: [name] RN, BSN Entered By: [name] on 09/29/2020 08:43:28 -------------------------------------------------------------------------------- Pain Assessment Details Patient Name: Date of Service: [name] 09/29/2020 8:30 A M Medical Record Number: [id number] Patient Account Number: [id number] Date of Birth/Sex: Treating RN: [birth year] (86 y.o. [name] Primary Care Jachai Okazaki: Lajean Manes T Other Clinician: Referring Kambre Messner: Treating Zoua Caporaso/Extender: Evelena Peat in Treatment: 40 Active Problems Location of Pain Severity and Description of Pain Patient Has Paino Yes Patient Has Paino Yes Site Locations Pain Location: Pain in Ulcers With Dressing Change: Yes Duration of the Pain. Constant / Intermittento Intermittent Rate the pain. Current Pain Level: 2 Character of Pain Describe the Pain: Aching, Tender Pain Management and Medication Current Pain Management: Medication: Yes Is the Current Pain Management Adequate: Adequate How does your wound impact your activities of daily livingo Sleep: No Bathing: No  Appetite: No Relationship With Others: No Bladder Continence: No Emotions: No Bowel Continence: No Work: No Toileting: No Drive: No Dressing: No Hobbies: No Electronic Signature(s) Signed: 09/29/2020 5:10:38 PM By: [name]  RN, BSN Entered By: [name] on 09/29/2020 08:42:08 -------------------------------------------------------------------------------- Patient/Caregiver Education Details Patient Name: Date of Service: [name] 10/18/2021andnbsp8:30 A M Medical Record Number: [id number] Patient Account Number: [id number] Date of Birth/Gender: Treating RN: January 22, 1933 (86 y.o. [name] Primary Care Physician: Lajean Manes T Other Clinician: Referring Physician: Treating Physician/Extender: Evelena Peat in Treatment: 51 Education Assessment Education Provided To: Patient Education Topics Provided Wound/Skin Impairment: Methods: Explain/Verbal Responses: State content correctly [employer]) Signed: 09/29/2020 5:42:40 PM By: [name] RN, BSN Entered By: [name] on 09/29/2020 08:43:39 -------------------------------------------------------------------------------- Wound Assessment Details Patient Name: Date of Service: [name]. 09/29/2020 8:30 A M Medical Record Number: [id number] Patient Account Number: [id number] Date of Birth/Sex: Treating RN: 01/29/1933 (86 y.o. [name] Primary Care Kemari Mares: Lajean Manes T Other Clinician: Referring Enrica Corliss: Treating Shiya Fogelman/Extender: [name] in Treatment: 40 Wound Status Wound Number: 5 Primary Venous Leg Ulcer Etiology: Wound Location: Left, Lateral Lower Leg Wound Open Wounding Event: Gradually Appeared Status: Date Acquired: 03/21/2020 Comorbid Cataracts, Anemia, Arrhythmia, Congestive Heart Failure, Weeks Of Treatment: 27 History: Hypertension, Peripheral Venous Disease, Received Radiation Clustered Wound: Yes Photos Photo Uploaded By: Mikeal Hawthorne on 09/30/2020 11:51:03 Wound Measurements Length: (cm) 0.9 Width: (cm) 0.4 Depth: (cm) 0.1 Clustered Quantity: 2 Area: (cm) 0. Volume: (cm) 0. % Reduction in  Area: 43.7% % Reduction in Volume: 44% Epithelialization: Medium (34-66%) Tunneling: No 283 Undermining: No 028 Wound Description Classification: Full Thickness Without Exposed Support Stru Wound Margin: Flat and Intact Exudate Amount: Small Exudate Type: Serosanguineous Exudate Color: red, brown ctures Foul Odor After Cleansing: No Slough/Fibrino Yes Wound Bed Granulation Amount: Large (67-100%) Exposed Structure Granulation Quality: Red Fascia Exposed: No Necrotic Amount: Small (1-33%) Fat Layer (Subcutaneous Tissue) Exposed: Yes Necrotic Quality: Adherent Slough Tendon Exposed: No Muscle Exposed: No Joint Exposed: No Bone Exposed: No Treatment Notes Wound #5 (Left, Lateral Lower Leg) 1. Cleanse With Wound Cleanser Soap and water 2. Periwound Care Moisturizing lotion 3. Primary Dressing Applied Calcium Alginate Ag 4. Secondary Dressing Dry Gauze 6. Support Layer Applied 3 layer compression wrap Notes netting. Electronic Signature(s) Signed: 09/29/2020 5:10:38 PM By: [name] RN, BSN Entered By: [name] on 09/29/2020 08:51:15 -------------------------------------------------------------------------------- Wound Assessment Details Patient Name: Date of Service: [name]. 09/29/2020 8:30 A M Medical Record Number: [id number] Patient Account Number: [id number] Date of Birth/Sex: Treating RN: 05-Nov-1933 (86 y.o. [name] Primary Care Errol Ala: Lajean Manes T Other Clinician: Referring Durrel Mcnee: Treating Deklin Bieler/Extender: [name] in Treatment: 40 Wound Status Wound Number: 8 Primary Venous Leg Ulcer Etiology: Wound Location: Left, Proximal, Lateral Lower Leg Wound Healed - Epithelialized Wounding Event: Gradually Appeared Status: Date Acquired: 09/15/2020 Comorbid Cataracts, Anemia, Arrhythmia, Congestive Heart Failure, Weeks Of Treatment: 2 History: Hypertension, Peripheral Venous Disease,  Received Radiation Clustered Wound: No Photos Photo Uploaded By: Mikeal Hawthorne on 09/30/2020 11:50:49 Wound Measurements Length: (cm) Width: (cm) Depth: (cm) Area: (cm) Volume: (cm) 0 % Reduction in Area: 100% 0 % Reduction in Volume: 100% 0 Epithelialization: Large (67-100%) 0 Tunneling: No 0 Undermining: No Wound Description Classification: Full Thickness Without Exposed Support Structures Wound Margin: Distinct, outline attached Exudate Amount: None Present Foul Odor After Cleansing: No Slough/Fibrino No Wound Bed Granulation Amount:  None Present (0%) Exposed Structure Necrotic Amount: None Present (0%) Fascia Exposed: No Fat Layer (Subcutaneous Tissue) Exposed: No Tendon Exposed: No Muscle Exposed: No Joint Exposed: No Bone Exposed: No Electronic Signature(s) Signed: 09/29/2020 5:10:38 PM By: [name] RN, BSN Signed: 09/29/2020 5:42:40 PM By: [name] RN, BSN Entered By: [name] on 09/29/2020 09:22:51 -------------------------------------------------------------------------------- [place] Details Patient Name: Date of Service: [name], [place]. 09/29/2020 8:30 A M Medical Record Number: [id number] Patient Account Number: [id number] Date of Birth/Sex: Treating RN: 02-24-1933 (86 y.o. [name] Primary Care Shiree Altemus: Lajean Manes T Other Clinician: Referring Antwuan Eckley: Treating Ronin Crager/Extender: [name] in Treatment: 40 Vital Signs Time Taken: 08:37 Temperature (F): 97.9 Height (in): 69 Pulse (bpm): 71 Source: Stated Respiratory Rate (breaths/min): 18 Weight (lbs): 163 Blood Pressure (mmHg): 164/82 Source: Stated Reference Range: 80 - 120 mg / dl Body Mass Index (BMI): 24.1 Electronic Signature(s) Signed: 09/29/2020 5:10:38 PM By: [name] RN, BSN Entered By: [name] on 09/29/2020 08:38:48

## 2020-10-06 ENCOUNTER — Other Ambulatory Visit: Payer: Self-pay

## 2020-10-06 ENCOUNTER — Encounter (HOSPITAL_BASED_OUTPATIENT_CLINIC_OR_DEPARTMENT_OTHER): Payer: Medicare Other | Admitting: Internal Medicine

## 2020-10-06 DIAGNOSIS — L97828 Non-pressure chronic ulcer of other part of left lower leg with other specified severity: Secondary | ICD-10-CM | POA: Diagnosis not present

## 2020-10-06 NOTE — Progress Notes (Signed)
MONQUIE, FULGHAM ([id number]) Visit Report for 10/06/2020 Arrival Information Details Patient Name: Date of Service: [place], [name] 10/06/2020 8:30 A M Medical Record Number: [id number] Patient Account Number: [id number] Date of Birth/Sex: Treating RN: 1933/08/07 (86 y.o. [name] Primary Care Soma Lizak: Lajean Manes T Other Clinician: Referring Trudy Kory: Treating Alexy Bringle/Extender: Evelena Peat in Treatment: 40 Visit Information History Since Last Visit Added or deleted any medications: No Patient Arrived: Ambulatory Any new allergies or adverse reactions: No Arrival Time: 08:49 Had a fall or experienced change in No Accompanied By: husband activities of daily living that may affect Transfer Assistance: None risk of falls: Patient Identification Verified: Yes Signs or symptoms of abuse/neglect since last visito No Secondary Verification Process Completed: Yes Hospitalized since last visit: No Patient Requires Transmission-Based Precautions: No Implantable device outside of the clinic excluding No Patient Has Alerts: Yes cellular tissue based products placed in the center Patient Alerts: Patient on Blood Thinner since last visit: Right ABI:0.99 Has Dressing in Place as Prescribed: Yes Pain Present Now: Yes Electronic Signature(s) Signed: 10/06/2020 1:09:22 PM By: Sandre Kitty Entered By: Sandre Kitty on 10/06/2020 08:50:05 -------------------------------------------------------------------------------- Compression Therapy Details Patient Name: Date of Service: [name] 10/06/2020 8:30 A M Medical Record Number: [id number] Patient Account Number: [id number] Date of Birth/Sex: Treating RN: [birth year] (86 y.o. [name] Primary Care Maleeya Peterkin: Patrina Levering Other Clinician: Referring Marcellino Fidalgo: Treating Tanaya Dunigan/Extender: Evelena Peat in Treatment: 54 Compression Therapy Performed for  Wound Assessment: Wound #5 Left,Lateral Lower Leg Performed By: Clinician Deon Pilling, RN Compression Type: Three Layer Pre Treatment ABI: 1.1 Electronic Signature(s) Signed: 10/06/2020 5:04:57 PM By: Deon Pilling Entered By: Deon Pilling on 10/06/2020 09:18:36 -------------------------------------------------------------------------------- Encounter Discharge Information Details Patient Name: Date of Service: [name] 10/06/2020 8:30 A M Medical Record Number: [id number] Patient Account Number: [id number] Date of Birth/Sex: Treating RN: 1933-08-27 (86 y.o. [name] Primary Care Jaylei Fuerte: Patrina Levering Other Clinician: Referring Abdo Denault: Treating Chantil Bari/Extender: Evelena Peat in Treatment: 64 Encounter Discharge Information Items Discharge Condition: Stable Ambulatory Status: Ambulatory Discharge Destination: Home Transportation: Private Auto Accompanied By: husband Schedule Follow-up Appointment: Yes Clinical Summary of Care: Electronic Signature(s) Signed: 10/06/2020 5:04:57 PM By: Deon Pilling Entered By: Deon Pilling on 10/06/2020 09:19:50 -------------------------------------------------------------------------------- Patient/Caregiver Education Details Patient Name: Date of Service: [name] 10/25/2021andnbsp8:30 A M Medical Record Number: [id number] Patient Account Number: [id number] Date of Birth/Gender: Treating RN: 1933-05-09 (86 y.o. [name] Primary Care Physician: Lajean Manes T Other Clinician: Referring Physician: Treating Physician/Extender: Evelena Peat in Treatment: 20 Education Assessment Education Provided To: Patient Education Topics Provided Wound/Skin Impairment: Handouts: Skin Care Do's and Dont's Methods: Explain/Verbal Responses: Reinforcements needed Electronic Signature(s) Signed: 10/06/2020 5:04:57 PM By: Deon Pilling Entered By:  Deon Pilling on 10/06/2020 09:19:36 -------------------------------------------------------------------------------- Wound Assessment Details Patient Name: Date of Service: [name] 10/06/2020 8:30 A M Medical Record Number: [id number] Patient Account Number: [id number] Date of Birth/Sex: Treating RN: 1933-11-07 (86 y.o. [name] Primary Care Adiyah Lame: Patrina Levering Other Clinician: Referring Randy Whitener: Treating Dasie Chancellor/Extender: [name] in Treatment: 41 Wound Status Wound Number: 5 Primary Etiology: Venous Leg Ulcer Wound Location: Left, Lateral Lower Leg Wound Status: Open Wounding Event: Gradually Appeared Date Acquired: 03/21/2020 Weeks Of Treatment: 28 Clustered Wound: Yes Wound Measurements Length: (cm) 0.9 Width: (cm) 0.4 Depth: (cm) 0.1 Area: (cm) 0.283 Volume: (cm)  0.028 % Reduction in Area: 43.7% % Reduction in Volume: 44% Wound Description Classification: Full Thickness Without Exposed Support Structu res Treatment Notes Wound #5 (Left, Lateral Lower Leg) 1. Cleanse With Wound Cleanser Soap and water 2. Periwound Care Moisturizing lotion 3. Primary Dressing Applied Calcium Alginate Ag 4. Secondary Dressing Dry Gauze Foam 6. Support Layer Applied 3 layer compression wrap Notes netting. Electronic Signature(s) Signed: 10/06/2020 1:09:22 PM By: Sandre Kitty Signed: 10/06/2020 5:02:41 PM By: [name] RN, BSN Entered By: Sandre Kitty on 10/06/2020 08:50:34 -------------------------------------------------------------------------------- Vitals Details Patient Name: Date of Service: [name], [place] J. 10/06/2020 8:30 A M Medical Record Number: [id number] Patient Account Number: [id number] Date of Birth/Sex: Treating RN: 04/18/1933 (86 y.o. [name] Primary Care Polina Burmaster: Lajean Manes T Other Clinician: Referring Letta Cargile: Treating Khadijah Mastrianni/Extender: Evelena Peat in Treatment: 20 Vital Signs Time Taken: 08:50 Temperature (F): 98.0 Height (in): 69 Pulse (bpm): 73 Weight (lbs): 163 Respiratory Rate (breaths/min): 18 Body Mass Index (BMI): 24.1 Blood Pressure (mmHg): 149/87 Reference Range: 80 - 120 mg / dl Electronic Signature(s) Signed: 10/06/2020 1:09:22 PM By: Sandre Kitty Entered By: Sandre Kitty on 10/06/2020 08:50:27

## 2020-10-06 NOTE — Progress Notes (Signed)
Tracy, Green ([id number]) Visit Report for 10/06/2020 SuperBill Details Patient Name: Date of Service: [place], [name] 10/06/2020 Medical Record Number: [id number] Patient Account Number: [id number] Date of Birth/Sex: Treating RN: 1933/08/09 (86 y.o. [name], Tammi Klippel Primary Care Provider: Lajean Manes T Other Clinician: Referring Provider: Treating Provider/Extender: Evelena Peat in Treatment: 41 Diagnosis Coding ICD-10 Codes Code Description I87.321 Chronic venous hypertension (idiopathic) with inflammation of right lower extremity L97.828 Non-pressure chronic ulcer of other part of left lower leg with other specified severity I87.322 Chronic venous hypertension (idiopathic) with inflammation of left lower extremity L97.522 Non-pressure chronic ulcer of other part of left foot with fat layer exposed Facility Procedures The patient participates with Medicare or their insurance follows the Medicare Facility Guidelines CPT4 Code Description Modifier Quantity [id number] (Facility Use Only) [phone] - APPLY [name] 1 Electronic Signature(s) Signed: 10/06/2020 5:04:57 PM By: Deon Pilling Signed: 10/06/2020 5:10:17 PM By: Linton Ham MD Entered By: Deon Pilling on 10/06/2020 09:19:57

## 2020-10-13 ENCOUNTER — Other Ambulatory Visit: Payer: Self-pay

## 2020-10-13 ENCOUNTER — Encounter (HOSPITAL_BASED_OUTPATIENT_CLINIC_OR_DEPARTMENT_OTHER): Payer: Medicare Other | Attending: Internal Medicine | Admitting: Internal Medicine

## 2020-10-13 DIAGNOSIS — L97522 Non-pressure chronic ulcer of other part of left foot with fat layer exposed: Secondary | ICD-10-CM | POA: Diagnosis not present

## 2020-10-13 DIAGNOSIS — I872 Venous insufficiency (chronic) (peripheral): Secondary | ICD-10-CM | POA: Insufficient documentation

## 2020-10-13 DIAGNOSIS — Z950 Presence of cardiac pacemaker: Secondary | ICD-10-CM | POA: Insufficient documentation

## 2020-10-13 DIAGNOSIS — Z853 Personal history of malignant neoplasm of breast: Secondary | ICD-10-CM | POA: Diagnosis not present

## 2020-10-13 DIAGNOSIS — I87321 Chronic venous hypertension (idiopathic) with inflammation of right lower extremity: Secondary | ICD-10-CM | POA: Diagnosis not present

## 2020-10-13 DIAGNOSIS — I87322 Chronic venous hypertension (idiopathic) with inflammation of left lower extremity: Secondary | ICD-10-CM | POA: Insufficient documentation

## 2020-10-13 DIAGNOSIS — Z7901 Long term (current) use of anticoagulants: Secondary | ICD-10-CM | POA: Diagnosis not present

## 2020-10-13 DIAGNOSIS — L97828 Non-pressure chronic ulcer of other part of left lower leg with other specified severity: Secondary | ICD-10-CM | POA: Diagnosis not present

## 2020-10-13 NOTE — Progress Notes (Signed)
[name], [name] ([id number]) Visit Report for 10/13/2020 Debridement Details Patient Name: Date of Service: [name], [name] 10/13/2020 8:30 A M Medical Record Number: [id number] Patient Account Number: [id number] Date of Birth/Sex: Treating RN: [birth year] (86 y.o. [name] Primary Care Provider: Lajean Green Green Other Clinician: Referring Provider: Treating Provider/Extender: [name] in Treatment: 42 Debridement Performed for Assessment: Wound #5 Left,Lateral Lower Leg Performed By: Physician [name]., MD Debridement Type: Debridement Severity of Tissue Pre Debridement: Fat layer exposed Level of Consciousness (Pre-procedure): Awake and Alert Pre-procedure Verification/Time Out Yes - 09:18 Taken: Start Time: 09:18 Pain Control: Lidocaine 5% topical ointment Green Area Debrided (L x W): otal 0.9 (cm) x 0.3 (cm) = 0.27 (cm) Tissue and other material debrided: Non-Viable, Eschar, Skin: Epidermis Level: Skin/Epidermis Debridement Description: Selective/Open Wound Instrument: Curette Bleeding: Minimum Hemostasis Achieved: Pressure End Time: 09:19 Procedural Pain: 0 Post Procedural Pain: 0 Response to Treatment: Procedure was tolerated well Level of Consciousness (Post- Awake and Alert procedure): Post Debridement Measurements of Total Wound Length: (cm) 0.9 Width: (cm) 0.3 Depth: (cm) 0.1 Volume: (cm) 0.021 Character of Wound/Ulcer Post Debridement: Improved Severity of Tissue Post Debridement: Fat layer exposed Post Procedure Diagnosis Same as Pre-procedure Electronic Signature(s) Signed: 10/13/2020 5:37:06 PM By: Tracy Ham MD Signed: 10/13/2020 5:57:05 PM By: Tracy Hurst RN, BSN Entered By: [name] on 10/13/2020 09:22:59 -------------------------------------------------------------------------------- HPI Details Patient Name: Date of Service: Tracy Banker J. 10/13/2020 8:30 A M Medical Record Number:  [id number] Patient Account Number: [id number] Date of Birth/Sex: Treating RN: 1933-07-30 (86 y.o. [name] Primary Care Provider: Patrina Green Other Clinician: Referring Provider: Treating Provider/Extender: [name] in Treatment: 75 History of Present Illness HPI Description: ADMISSION 12/20/2019 This is an 86 year old woman referred by her primary physician Dr. Felipa Green for review of a wound on her right lateral lower leg. She was actually in this clinic on 2 separate occasions in 2010 and 2012 cared for by Dr. Sherilyn Green. At that point in time she had wounds on her right leg as well. She tells [place] to 1 month ago she noticed a scab building up on her right lateral lower leg this opened into a wound. There was no overt cause of this no trauma, no infection that she is aware of. She has a history of chronic venous insufficiency and wears compression stockings fairly religiously indeed she is done well over the last 8 years since she was last in this clinic. She has been applying Vaseline on this and a covering phone. This is not progressing towards healing. Past medical history; interstitial lung disease, chronic atrial fibrillation status post pacemaker, osteoarthritis of the left knee, left breast CA, chronic repeat venous insufficiency. She takes Eliquis for her atrial fibrillation stroke prophylaxis. ABI in our clinic was 0.99 on the right 1/15; superficial wound on the right lateral calf in the setting of severe acute skin changes from chronic venous insufficiency and lymphedema. We used Iodoflex last week she complained of a lot of pain 1/22; this is a small but difficult wound on the right lateral calf in the setting of severe chronic venous insufficiency and secondary lymphedema. She continues to state the wounds things hurts when she is up on it but seems to be relieved by putting her leg up. I changed her to [facility] last week because the  Iodoflex seem to be causing stinging. 1/29. This wound appears to be contracting somewhat. Changes of chronic  venous insufficiency with secondary lymphedema 2/12; not as good as surface today and slightly bigger. I had to change her from Iodoflex to [facility] because of the stinging pain although she does not think it was any better on the [facility]. She comes into clinic today with a area on the medial left great toe. She said she noticed blood on her sock last Saturday. She had some form of bunion surgery by [name] her podiatrist sometime in 2019 she said he "shave the area". I could not find his operative report although I did see reference to the bunion in the area. 2/19; somewhat improved wound on the right lateral lower leg however the wound she came in on the bunion of her left great toe is actually I think larger still somewhat inflamed. We have been using Iodoflex 2/26; not much improvement in either wound area which is the original venous wound on the right lateral lower leg and in the area on the bunion of her left great toe. This still looks somewhat inflamed and tender. We have been using Iodoflex with open much improvement. 3/4; in general both her wounds look better this includes the original venous insufficiency wound on the right lateral lower leg and the area on her tip of her bunion on the left great toe medial aspect of the MTP. Change to Hydrofera Blue last week 3/12; the patient still has a small geographic shaped wound on the right lateral lower leg. We have been using Hydrofera Blue but I changed her to endoform today. She also has the area in the tip of the bunion of the left great toe. We will try Hydrofera Blue here as well 3/19; the small geographic wound on the right lateral lower leg has not filled in. There is some surrounding induration we have noticed this previously. This may be venous inflammation but I wonder about biopsying this if this is not  closing up. The area over the left medial first MTP [bunion deformity] requires debridement. This is not closing in. I am not convinced she is offloading adequately 3/26; right lateral lower leg in the setting of severe venous insufficiency and also an area over the first MTP bunion deformity. No debridement is required. We have been using endoform 4/9; right lateral lower leg wound in the setting of severe venous insufficiency and also a refractory area over the first MTP bunion deformity. The area on the right lateral lower leg is just about closed there is still a minor open area here. She comes in today with a mirror image area on the left lateral lower leg. She says this started as a scab 2 weeks ago and is gradually morphed into an open wound very similar to what she has on the right leg. She has severe bilateral venous insufficiency with venous hypertension obvious from clinical exam. 4/16; her original wound the right lateral leg wound in the setting of chronic venous hypertension is a small open area but very small. We have been using endoform. Her wound over the left first MTP bunion deformity also appears to be small and closing in. Unfortunately she has a large relative area on the left lateral calf which was new last week. Very adherent debris over this we have been using Iodoflex however that may be contributing to the debris on the wound surface 4/23; the original wound is closed and the area on the left first metatarsal bunion deformity is also almost closed the new area from last  week required debridement. She went for her reflux studies that did not take her lower extremity wraps off. This is not helpful. She did have significant reflux in the right common femoral vein. I am not going to press this issue further. She clinically has severe venous hypertension 4/29; the original wound is closed on the right lateral lower calf. The area on the left first metatarsal/bunion deformity has  a small slitlike opening that is still not closed. The real problem here is now wound on the left lateral calf which is necrotic and deep. We used Iodoflex on this wound last time. Silver alginate on the left first toe The patient has [place] wraps. We should be able to transition the right leg into compression stockings and were doing this today. 5/7; the original wound remains closed on the right lateral calf. The left first metatarsal head still is open. Deterioration in the wound which is the new wound from several weeks ago on the left lateral calf. The patient is not aware how this could have happened. We have been using Sorbact starting last week under 3 layer compression 5/14; the right lateral calf is closed the area on the first met head on the left is closed However the new area from 3 weeks ago on the left lateral calf continues to expand. There is raised nodular tissue around the wound necrotic debris on the surface. Circumference of the wound also looks necrotic. It looks as though there is involvement of the tissue under the skin around the large area of this wound which looks threatened. She is complaining of pain. Culture of this wound I did last week showed rare coag negative staph variant I have never heard of although I am doubtful this has clinical significance 05/09/20-Patient returns with a left lateral calf wound looking about the same, the biopsies reviewed show no atypical features and consistent with trauma or pressure injury or stasis change, patient has appointment to be scheduled with vascular We are using silver alginate and 3 layer compression Patient was started on Trental by dermatology which she has been taking for a week now 6/4; left lateral calf wound. I reviewed the dermatology notes with Dr. Martin Majestic from [facility] dermatology. 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  Put her on pentoxifylline noteworthy that she is on Eliquis as well. We have been using silver alginate under compression. She has an appointment for repeat vascular studies on 7/6. This gets back to the fact that they did not take the wrap off on the original studies that were done. I do not believe she has an arterial issue. 6/10; left lateral calf wound. I put her on Hydrofera Blue last week. Some improvement in the surface condition of the wound. She has repeat vascular studies/reflux studies on 7/6. We are trying to get Apligraf through [place] [employer]. Her husband states he looked on the website this is not going to be approved. I am really not sure if this is a "class effect" 6/18; somewhat surprisingly Apligraf was approved and 100% covered. Her husband was also quite shocked by this. Nevertheless we have her repeat vascular studies on 7/6 and I will not be able to apply this until this is done. We are using Hydrofera Blue to the wound on the left. Her original wound on the right lower leg has maintain closure using compression stocking 6/25; repeat vascular studies/venous on 7/6; we will apply the  Apligraf I think that week. We are also going to work around a week's vacation they have the first week in August. 7/2; reflux studies on 7/6;. Likely place first Apligraf from the major wound area on 7/9; 7/9; patient was kindly seen by Dr. Donnetta Hutching of vein and vascular to review her circulation status with the refractory wounds in the left leg. At first wounds in this clinic were on the right leg and they heal she now has a juxta lite stocking. Dr. Donnetta Hutching thought she had venous stasis disease with venous ulcerations. He did not feel that there was anything that would suggest to expect approval with a blush ablation of her saphenous vein the saphenous veins were felt to be relatively small in caliber. She was not felt to have an arterial issue Apligraf #1 apply today in the standard fashion 7/23;  the patient came in today complaining of a lot of pain in the left lower leg where the wound is. We applied Apligraf 2 weeks ago I also increased her compression from 3-4 layer wondering about the venous hypertension. She arrives today with her original wound that we have been treating with Apligraf looking quite healthy although there is some surface slough. Problematically she has de-epithelialized and developed erythema below the wound towards the lateral malleolus itself. This is somewhat angry. I think this is venous stasis loss of epithelialization. I do not believe this is infection . Apligraf #2 applied to the original wound area 8/9-Patient returns at 2 weeks for Apligraf #3, the left leg venous leg ulcer area had more slough this time 8/23 Apligraf #4 her wound areas are small however she is still complaining of pain. She has previously been seen by vein and vascular. She has not felt to have an arterial issue she was felt to have venous stasis disease. She originally came here with a wound on her right leg on the right lateral calf this healed out. She then developed a difficult area on the left. She saw dermatology wondered whether this could be a vasculopathy. I am not sure if she is still on pentoxifylline the dermatology suggested I will have to clarify this with 9/7; patient complains of unrelenting pain making her life miserable. I been using Apligraf to close her major wound down and the major wound has come down to 2 open areas which are small and granulated. However there is erythema both inferiorly and superiorly cratered thick skin with wound issues between these areas extending into the lateral calf. There is erythema here and tenderness. I have been managing this largely as this this was severe stasis dermatitis. Vein and vascular did not feel that she would benefit from an ablation. She went to see [facility] dermatology some months ago who wondered about a vasculopathy and  suggested a biopsy but I am reluctant to go forward with that here. The patient is on Eliquis. 9/13; the patient saw Dr. Ubaldo Glassing on 9/8; she noted superficial cutaneous breakdown located on her left inferior lateral posterior tibia. She does not have a specific diagnosis for the patient's ulcer. Wanted to rule out squamous cell carcinoma and pyoderma gangrenosum she did a punch biopsy that is still bleeding. They came back for a dressing change after that procedure. They have still not heard the results of the biopsy. Was still using silver alginate as the primary dressing. She is tolerating the doxycycline although she had to take it with food 9/20; the biopsy that was done by dermatology/Dr. Ubaldo Glassing on 9/8  showed findings consistent with stasis dermatitis. I think we can put any thought of an alternative diagnosis to rest here. She is already seen vein and vascular today did not think she was a candidate for ablations. They felt she had adequate arterial flow. We have been using silver alginate and TCA I put her on doxycycline last time because of erythema around the wound which very well could be venous inflammation/stasis dermatitis but I had some concerns about cellulitis. Doxycycline being a good drug for this because of its anti-inflammatory as well as its antibiotic effects. We also received a note from the patient's cardiologist Dr. Darene Lamer aylor about interactions between doxycycline and propafenone. While I was aware of the potential interaction between propafenone and quinolones I was not aware of any interaction between doxycycline and propafenone. Nor can I find this interaction described. Nevertheless I will follow Dr. Tanna Furry expertise in this area. She was having nausea in any case and was about to stop it even without cardiology advice. She was also in the ER because of bleeding from the biopsy site and in urgent care because of nausea 9/27 weekly follow-up. Area on the left lateral calf  venous insufficiency wounds with at one point severe stasis dermatitis. She is actually doing quite nicely. She has 4 or 5 small open areas. Some of them with eschared surfaces which I removed with a #5 curette. The skin around here looks excellent. Using silver alginate Liberal TCA under compression 10/4; left lateral calf venous insufficiency wounds. Using silver alginate 10/11; left lateral venous insufficiency wounds. She has 3 areas that we are watching now. These almost form a line. The most distal part is definitely still open. Superiorly the next area is eschared which does not look 100% closed where is the most proximal area appears closed. We are using silver alginate under compression. She has made a nice improvement. She tells me that she has bought a small exercise bike one of the portable ones that. She is using this and this actually may be helpful 10/18; left lateral venous insufficiency wounds. 1 small opening remains almost completely closed. We have been using silver alginate under compression 11/1; left lateral venous insufficiency wounds. I thought this would be closed. Using silver alginate. Arrives in clinic with dry skin and callus over the wound surface. I removed this with a #5 curette clean wound. Change the primary dressing to polymen Electronic Signature(s) Signed: 10/13/2020 5:37:06 PM By: Tracy Ham MD Entered By: [name] on 10/13/2020 09:23:44 -------------------------------------------------------------------------------- Physical Exam Details Patient Name: Date of Service: Tracy Banker J. 10/13/2020 8:30 A M Medical Record Number: [id number] Patient Account Number: [id number] Date of Birth/Sex: Treating RN: December 27, 1932 (86 y.o. [name] Primary Care Provider: Patrina Green Other Clinician: Referring Provider: Treating Provider/Extender: [name] in Treatment: 80 Constitutional Sitting or standing Blood  Pressure is within target range for patient.. Pulse regular and within target range for patient.Marland Kitchen Respirations regular, non-labored and within target range.. Temperature is normal and within the target range for the patient.Marland Kitchen Appears in no distress. Notes Wound exam; left lateral ankle and calf. Debrided the remaining wound of surface dry skin and callus. Clean wound healthy edges I cannot see any good reason why this will not close. We have good edema control no evidence of infection Electronic Signature(s) Signed: 10/13/2020 5:37:06 PM By: Tracy Ham MD Entered By: [name] on 10/13/2020 09:24:36 -------------------------------------------------------------------------------- Physician Orders Details Patient Name: Date of Service: [name], [place]  TSY J. 10/13/2020 8:30 A M Medical Record Number: [id number] Patient Account Number: [id number] Date of Birth/Sex: Treating RN: 05-Oct-1933 (86 y.o. [name] Primary Care Provider: Lajean Green Green Other Clinician: Referring Provider: Treating Provider/Extender: [name] in Treatment: 59 Verbal / Phone Orders: No Diagnosis Coding ICD-10 Coding Code Description I87.321 Chronic venous hypertension (idiopathic) with inflammation of right lower extremity L97.828 Non-pressure chronic ulcer of other part of left lower leg with other specified severity I87.322 Chronic venous hypertension (idiopathic) with inflammation of left lower extremity L97.522 Non-pressure chronic ulcer of other part of left foot with fat layer exposed Follow-up Appointments Return Appointment in 1 week. Dressing Change Frequency Wound #5 Left,Lateral Lower Leg Do not change entire dressing for one week. Skin Barriers/Peri-Wound Care Moisturizing lotion Wound Cleansing May shower with protection. - use cast protector Primary Wound Dressing Wound #5 Left,Lateral Lower Leg Polymem Silver Secondary Dressing Wound #5  Left,Lateral Lower Leg Dry Gauze Edema Control 3 Layer Compression System - Left Lower Extremity Avoid standing for long periods of time Elevate legs to the level of the heart or above for 30 minutes daily and/or when sitting, a frequency of: - throughout the day. Exercise regularly Support Garment 20-30 mm/Hg pressure to: - patient to apply farrow wrap 4000 to right leg. Apply in the morning and remove at night. Off-Loading Open toe surgical shoe to: - left foot Electronic Signature(s) Signed: 10/13/2020 5:37:06 PM By: Tracy Ham MD Signed: 10/13/2020 5:57:05 PM By: Tracy Hurst RN, BSN Entered By: [name] on 10/13/2020 09:20:50 -------------------------------------------------------------------------------- Problem List Details Patient Name: Date of Service: [name], [place] J. 10/13/2020 8:30 A M Medical Record Number: [id number] Patient Account Number: [id number] Date of Birth/Sex: Treating RN: [birth year] (85 y.o. [name] Primary Care Provider: Other Clinician: Lajean Green Green Referring Provider: Treating Provider/Extender: [name] in Treatment: 42 Active Problems ICD-10 Encounter Code Description Active Date MDM Diagnosis I87.321 Chronic venous hypertension (idiopathic) with inflammation of right lower 12/20/2019 No Yes extremity L97.828 Non-pressure chronic ulcer of other part of left lower leg with other specified 03/21/2020 No Yes severity I87.322 Chronic venous hypertension (idiopathic) with inflammation of left lower 02/14/2020 No Yes extremity L97.522 Non-pressure chronic ulcer of other part of left foot with fat layer exposed 01/25/2020 No Yes Inactive Problems ICD-10 Code Description Active Date Inactive Date L97.812 Non-pressure chronic ulcer of other part of right lower leg with fat layer exposed 12/20/2019 12/20/2019 Resolved Problems Electronic Signature(s) Signed: 10/13/2020 5:37:06 PM By: Tracy Ham MD Entered  By: [name] on 10/13/2020 09:22:26 -------------------------------------------------------------------------------- Progress Note Details Patient Name: Date of Service: Tracy Banker J. 10/13/2020 8:30 A M Medical Record Number: [id number] Patient Account Number: [id number] Date of Birth/Sex: Treating RN: 02/17/1933 (86 y.o. [name] Primary Care Provider: Patrina Green Other Clinician: Referring Provider: Treating Provider/Extender: [name] in Treatment: 42 Subjective History of Present Illness (HPI) ADMISSION 12/20/2019 This is an 86 year old woman referred by her primary physician Dr. Felipa Green for review of a wound on her right lateral lower leg. She was actually in this clinic on 2 separate occasions in 2010 and 2012 cared for by Dr. Sherilyn Green. At that point in time she had wounds on her right leg as well. She tells [place] to 1 month ago she noticed a scab building up on her right lateral lower leg this opened into a wound. There was no overt cause of this no trauma, no infection  that she is aware of. She has a history of chronic venous insufficiency and wears compression stockings fairly religiously indeed she is done well over the last 8 years since she was last in this clinic. She has been applying Vaseline on this and a covering phone. This is not progressing towards healing. Past medical history; interstitial lung disease, chronic atrial fibrillation status post pacemaker, osteoarthritis of the left knee, left breast CA, chronic repeat venous insufficiency. She takes Eliquis for her atrial fibrillation stroke prophylaxis. ABI in our clinic was 0.99 on the right 1/15; superficial wound on the right lateral calf in the setting of severe acute skin changes from chronic venous insufficiency and lymphedema. We used Iodoflex last week she complained of a lot of pain 1/22; this is a small but difficult wound on the right lateral calf in the  setting of severe chronic venous insufficiency and secondary lymphedema. She continues to state the wounds things hurts when she is up on it but seems to be relieved by putting her leg up. I changed her to [facility] last week because the Iodoflex seem to be causing stinging. 1/29. This wound appears to be contracting somewhat. Changes of chronic venous insufficiency with secondary lymphedema 2/12; not as good as surface today and slightly bigger. I had to change her from Iodoflex to [facility] because of the stinging pain although she does not think it was any better on the [facility]. ooShe comes into clinic today with a area on the medial left great toe. She said she noticed blood on her sock last Saturday. She had some form of bunion surgery by [name] her podiatrist sometime in 2019 she said he "shave the area". I could not find his operative report although I did see reference to the bunion in the area. 2/19; somewhat improved wound on the right lateral lower leg however the wound she came in on the bunion of her left great toe is actually I think larger still somewhat inflamed. We have been using Iodoflex 2/26; not much improvement in either wound area which is the original venous wound on the right lateral lower leg and in the area on the bunion of her left great toe. This still looks somewhat inflamed and tender. We have been using Iodoflex with open much improvement. 3/4; in general both her wounds look better this includes the original venous insufficiency wound on the right lateral lower leg and the area on her tip of her bunion on the left great toe medial aspect of the MTP. Change to Hydrofera Blue last week 3/12; the patient still has a small geographic shaped wound on the right lateral lower leg. We have been using Hydrofera Blue but I changed her to endoform today. She also has the area in the tip of the bunion of the left great toe. We will try Hydrofera Blue  here as well 3/19; the small geographic wound on the right lateral lower leg has not filled in. There is some surrounding induration we have noticed this previously. This may be venous inflammation but I wonder about biopsying this if this is not closing up. The area over the left medial first MTP [bunion deformity] requires debridement. This is not closing in. I am not convinced she is offloading adequately 3/26; right lateral lower leg in the setting of severe venous insufficiency and also an area over the first MTP bunion deformity. No debridement is required. We have been using endoform 4/9; right lateral lower leg  wound in the setting of severe venous insufficiency and also a refractory area over the first MTP bunion deformity. The area on the right lateral lower leg is just about closed there is still a minor open area here. She comes in today with a mirror image area on the left lateral lower leg. She says this started as a scab 2 weeks ago and is gradually morphed into an open wound very similar to what she has on the right leg. She has severe bilateral venous insufficiency with venous hypertension obvious from clinical exam. 4/16; her original wound the right lateral leg wound in the setting of chronic venous hypertension is a small open area but very small. We have been using endoform. Her wound over the left first MTP bunion deformity also appears to be small and closing in. Unfortunately she has a large relative area on the left lateral calf which was new last week. Very adherent debris over this we have been using Iodoflex however that may be contributing to the debris on the wound surface 4/23; the original wound is closed and the area on the left first metatarsal bunion deformity is also almost closed the new area from last week required debridement. She went for her reflux studies that did not take her lower extremity wraps off. This is not helpful. She did have significant reflux in  the right common femoral vein. I am not going to press this issue further. She clinically has severe venous hypertension 4/29; the original wound is closed on the right lateral lower calf. The area on the left first metatarsal/bunion deformity has a small slitlike opening that is still not closed. The real problem here is now wound on the left lateral calf which is necrotic and deep. We used Iodoflex on this wound last time. Silver alginate on the left first toe The patient has [place] wraps. We should be able to transition the right leg into compression stockings and were doing this today. 5/7; the original wound remains closed on the right lateral calf. The left first metatarsal head still is open. Deterioration in the wound which is the new wound from several weeks ago on the left lateral calf. The patient is not aware how this could have happened. We have been using Sorbact starting last week under 3 layer compression 5/14; the right lateral calf is closed the area on the first met head on the left is closed However the new area from 3 weeks ago on the left lateral calf continues to expand. There is raised nodular tissue around the wound necrotic debris on the surface. Circumference of the wound also looks necrotic. It looks as though there is involvement of the tissue under the skin around the large area of this wound which looks threatened. She is complaining of pain. Culture of this wound I did last week showed rare coag negative staph variant I have never heard of although I am doubtful this has clinical significance 05/09/20-Patient returns with a left lateral calf wound looking about the same, the biopsies reviewed show no atypical features and consistent with trauma or pressure injury or stasis change, patient has appointment to be scheduled with vascular We are using silver alginate and 3 layer compression Patient was started on Trental by dermatology which she has been taking for a week  now 6/4; left lateral calf wound. I reviewed the dermatology notes with Dr. Martin Majestic from [facility] dermatology. See suggested the possibility of livedoid vasculopathy possibility of additional biopsies which I think  would have to be punch biopsies. Put her on pentoxifylline noteworthy that she is on Eliquis as well. We have been using silver alginate under compression. She has an appointment for repeat vascular studies on 7/6. This gets back to the fact that they did not take the wrap off on the original studies that were done. I do not believe she has an arterial issue. 6/10; left lateral calf wound. I put her on Hydrofera Blue last week. Some improvement in the surface condition of the wound. She has repeat vascular studies/reflux studies on 7/6. We are trying to get Apligraf through [place] [employer]. Her husband states he looked on the website this is not going to be approved. I am really not sure if this is a "class effect" 6/18; somewhat surprisingly Apligraf was approved and 100% covered. Her husband was also quite shocked by this. Nevertheless we have her repeat vascular studies on 7/6 and I will not be able to apply this until this is done. We are using Hydrofera Blue to the wound on the left. Her original wound on the right lower leg has maintain closure using compression stocking 6/25; repeat vascular studies/venous on 7/6; we will apply the Apligraf I think that week. We are also going to work around a week's vacation they have the first week in August. 7/2; reflux studies on 7/6;. Likely place first Apligraf from the major wound area on 7/9; 7/9; patient was kindly seen by Dr. Donnetta Hutching of vein and vascular to review her circulation status with the refractory wounds in the left leg. At first wounds in this clinic were on the right leg and they heal she now has a juxta lite stocking. Dr. Donnetta Hutching thought she had venous stasis disease with venous ulcerations. He did not feel that there was  anything that would suggest to expect approval with a blush ablation of her saphenous vein the saphenous veins were felt to be relatively small in caliber. She was not felt to have an arterial issue Apligraf #1 apply today in the standard fashion 7/23; the patient came in today complaining of a lot of pain in the left lower leg where the wound is. We applied Apligraf 2 weeks ago I also increased her compression from 3-4 layer wondering about the venous hypertension. She arrives today with her original wound that we have been treating with Apligraf looking quite healthy although there is some surface slough. Problematically she has de-epithelialized and developed erythema below the wound towards the lateral malleolus itself. This is somewhat angry. I think this is venous stasis loss of epithelialization. I do not believe this is infection . Apligraf #2 applied to the original wound area 8/9-Patient returns at 2 weeks for Apligraf #3, the left leg venous leg ulcer area had more slough this time 8/23 Apligraf #4 her wound areas are small however she is still complaining of pain. She has previously been seen by vein and vascular. She has not felt to have an arterial issue she was felt to have venous stasis disease. She originally came here with a wound on her right leg on the right lateral calf this healed out. She then developed a difficult area on the left. She saw dermatology wondered whether this could be a vasculopathy. I am not sure if she is still on pentoxifylline the dermatology suggested I will have to clarify this with 9/7; patient complains of unrelenting pain making her life miserable. I been using Apligraf to close her major wound down and the major  wound has come down to 2 open areas which are small and granulated. However there is erythema both inferiorly and superiorly cratered thick skin with wound issues between these areas extending into the lateral calf. There is erythema here and  tenderness. I have been managing this largely as this this was severe stasis dermatitis. Vein and vascular did not feel that she would benefit from an ablation. She went to see [facility] dermatology some months ago who wondered about a vasculopathy and suggested a biopsy but I am reluctant to go forward with that here. The patient is on Eliquis. 9/13; the patient saw Dr. Nicholas Lose on 9/8; she noted superficial cutaneous breakdown located on her left inferior lateral posterior tibia. She does not have a specific diagnosis for the patient's ulcer. Wanted to rule out squamous cell carcinoma and pyoderma gangrenosum she did a punch biopsy that is still bleeding. They came back for a dressing change after that procedure. They have still not heard the results of the biopsy. Was still using silver alginate as the primary dressing. She is tolerating the doxycycline although she had to take it with food 9/20; the biopsy that was done by dermatology/Dr. Nicholas Lose on 9/8 showed findings consistent with stasis dermatitis. I think we can put any thought of an alternative diagnosis to rest here. She is already seen vein and vascular today did not think she was a candidate for ablations. They felt she had adequate arterial flow. We have been using silver alginate and TCA I put her on doxycycline last time because of erythema around the wound which very well could be venous inflammation/stasis dermatitis but I had some concerns about cellulitis. Doxycycline being a good drug for this because of its anti-inflammatory as well as its antibiotic effects. We also received a note from the patient's cardiologist Dr. Karie Schwalbe aylor about interactions between doxycycline and propafenone. While I was aware of the potential interaction between propafenone and quinolones I was not aware of any interaction between doxycycline and propafenone. Nor can I find this interaction described. Nevertheless I will follow Dr. Lubertha Basque expertise in this  area. She was having nausea in any case and was about to stop it even without cardiology advice. She was also in the ER because of bleeding from the biopsy site and in urgent care because of nausea 9/27 weekly follow-up. Area on the left lateral calf venous insufficiency wounds with at one point severe stasis dermatitis. She is actually doing quite nicely. She has 4 or 5 small open areas. Some of them with eschared surfaces which I removed with a #5 curette. The skin around here looks excellent. Using silver alginate Liberal TCA under compression 10/4; left lateral calf venous insufficiency wounds. Using silver alginate 10/11; left lateral venous insufficiency wounds. She has 3 areas that we are watching now. These almost form a line. The most distal part is definitely still open. Superiorly the next area is eschared which does not look 100% closed where is the most proximal area appears closed. We are using silver alginate under compression. She has made a nice improvement. She tells me that she has bought a small exercise bike one of the portable ones that. She is using this and this actually may be helpful 10/18; left lateral venous insufficiency wounds. 1 small opening remains almost completely closed. We have been using silver alginate under compression 11/1; left lateral venous insufficiency wounds. I thought this would be closed. Using silver alginate. Arrives in clinic with dry skin and callus over  the wound surface. I removed this with a #5 curette clean wound. Change the primary dressing to polymen Objective Constitutional Sitting or standing Blood Pressure is within target range for patient.. Pulse regular and within target range for patient.Marland Kitchen Respirations regular, non-labored and within target range.. Temperature is normal and within the target range for the patient.Marland Kitchen Appears in no distress. Vitals Time Taken: 8:46 AM, Height: 69 in, Weight: 163 lbs, BMI: 24.1, Temperature: 97.9 F,  Pulse: 62 bpm, Respiratory Rate: 18 breaths/min, Blood Pressure: 136/80 mmHg. General Notes: Wound exam; left lateral ankle and calf. Debrided the remaining wound of surface dry skin and callus. Clean wound healthy edges I cannot see any good reason why this will not close. We have good edema control no evidence of infection Integumentary (Hair, Skin) Wound #5 status is Open. Original cause of wound was Gradually Appeared. The wound is located on the Left,Lateral Lower Leg. The wound measures 0.9cm length x 0.3cm width x 0.1cm depth; 0.212cm^2 area and 0.021cm^3 volume. There is Fat Layer (Subcutaneous Tissue) exposed. There is no tunneling or undermining noted. There is a medium amount of serosanguineous drainage noted. There is medium (34-66%) red granulation within the wound bed. There is a medium (34-66%) amount of necrotic tissue within the wound bed including Eschar and Adherent Slough. Assessment Active Problems ICD-10 Chronic venous hypertension (idiopathic) with inflammation of right lower extremity Non-pressure chronic ulcer of other part of left lower leg with other specified severity Chronic venous hypertension (idiopathic) with inflammation of left lower extremity Non-pressure chronic ulcer of other part of left foot with fat layer exposed Procedures Wound #5 Pre-procedure diagnosis of Wound #5 is a Venous Leg Ulcer located on the Left,Lateral Lower Leg .Severity of Tissue Pre Debridement is: Fat layer exposed. There was a Selective/Open Wound Skin/Epidermis Debridement with a total area of 0.27 sq cm performed by [name]., MD. With the following instrument(s): Curette to remove Non-Viable tissue/material. Material removed includes Eschar and Skin: Epidermis and after achieving pain control using Lidocaine 5% topical ointment. No specimens were taken. A time out was conducted at 09:18, prior to the start of the procedure. A Minimum amount of bleeding was controlled with  Pressure. The procedure was tolerated well with a pain level of 0 throughout and a pain level of 0 following the procedure. Post Debridement Measurements: 0.9cm length x 0.3cm width x 0.1cm depth; 0.021cm^3 volume. Character of Wound/Ulcer Post Debridement is improved. Severity of Tissue Post Debridement is: Fat layer exposed. Post procedure Diagnosis Wound #5: Same as Pre-Procedure Pre-procedure diagnosis of Wound #5 is a Venous Leg Ulcer located on the Left,Lateral Lower Leg . There was a Three Layer Compression Therapy Procedure by Tracy Hurst, RN. Post procedure Diagnosis Wound #5: Same as Pre-Procedure Plan Follow-up Appointments: Return Appointment in 1 week. Dressing Change Frequency: Wound #5 Left,Lateral Lower Leg: Do not change entire dressing for one week. Skin Barriers/Peri-Wound Care: Moisturizing lotion Wound Cleansing: May shower with protection. - use cast protector Primary Wound Dressing: Wound #5 Left,Lateral Lower Leg: Polymem Silver Secondary Dressing: Wound #5 Left,Lateral Lower Leg: Dry Gauze Edema Control: 3 Layer Compression System - Left Lower Extremity Avoid standing for long periods of time Elevate legs to the level of the heart or above for 30 minutes daily and/or when sitting, a frequency of: - throughout the day. Exercise regularly Support Garment 20-30 mm/Hg pressure to: - patient to apply farrow wrap 4000 to right leg. Apply in the morning and remove at night. Off-Loading: Open toe  surgical shoe to: - left foot 1. I change the primary dressing to polymen Ag hopefully this will close still under compression Electronic Signature(s) Signed: 10/13/2020 5:37:06 PM By: Tracy Ham MD Entered By: [name] on 10/13/2020 09:25:01 -------------------------------------------------------------------------------- SuperBill Details Patient Name: Date of Service: [name] 10/13/2020 Medical Record Number: [id number] Patient Account Number:  [id number] Date of Birth/Sex: Treating RN: 22-Nov-1933 (86 y.o. [name] Primary Care Provider: Lajean Green Green Other Clinician: Referring Provider: Treating Provider/Extender: [name] in Treatment: 42 Diagnosis Coding ICD-10 Codes Code Description [phone] Chronic venous hypertension (idiopathic) with inflammation of right lower extremity L97.828 Non-pressure chronic ulcer of other part of left lower leg with other specified severity I87.322 Chronic venous hypertension (idiopathic) with inflammation of left lower extremity L97.522 Non-pressure chronic ulcer of other part of left foot with fat layer exposed Facility Procedures The patient participates with Medicare or their insurance follows the Medicare Facility Guidelines: CPT4 Code Description Modifier Quantity [id number] - DEBRIDE WOUND 1ST 20 SQ CM OR < 1 ICD-10 Diagnosis Description L97.828 Non-pressure chronic ulcer  of other part of left lower leg with other specified severity I87.322 Chronic venous hypertension (idiopathic) with inflammation of left lower extremity Physician Procedures : CPT4 Code Description Modifier [id number] - WC PHYS DEBR WO ANESTH 20 SQ CM ICD-10 Diagnosis Description L97.828 Non-pressure chronic ulcer of other part of left lower leg with other specified severity I87.322 Chronic venous hypertension  (idiopathic) with inflammation of left lower extremity Quantity: 1 Electronic Signature(s) Signed: 10/13/2020 5:37:06 PM By: Tracy Ham MD Entered By: [name] on 10/13/2020 09:25:18

## 2020-10-15 NOTE — Progress Notes (Signed)
NASHLY, OLSSON ([id number]) Visit Report for 10/13/2020 Arrival Information Details Patient Name: Date of Service: [place], [name] 10/13/2020 8:30 A M Medical Record Number: [id number] Patient Account Number: [id number] Date of Birth/Sex: Treating RN: Jun 03, 1933 (86 y.o. [name] Primary Care Tavish Gettis: Lajean Manes T Other Clinician: Referring Yessenia Maillet: Treating Yakelin Grenier/Extender: Evelena Peat in Treatment: 29 Visit Information History Since Last Visit All ordered tests and consults were completed: No Patient Arrived: Ambulatory Added or deleted any medications: No Arrival Time: 08:45 Any new allergies or adverse reactions: No Accompanied By: husband Had a fall or experienced change in No Transfer Assistance: None activities of daily living that may affect Patient Identification Verified: Yes risk of falls: Secondary Verification Process Completed: Yes Signs or symptoms of abuse/neglect since last visito No Patient Requires Transmission-Based Precautions: No Hospitalized since last visit: No Patient Has Alerts: Yes Implantable device outside of the clinic excluding No Patient Alerts: Patient on Blood Thinner cellular tissue based products placed in the center Right ABI:0.99 since last visit: Has Dressing in Place as Prescribed: Yes Has Compression in Place as Prescribed: Yes Pain Present Now: No Electronic Signature(s) Signed: 10/15/2020 9:08:43 AM By: Carlene Coria RN Entered By: Carlene Coria on 10/13/2020 08:46:19 -------------------------------------------------------------------------------- Compression Therapy Details Patient Name: Date of Service: [name] 10/13/2020 8:30 A M Medical Record Number: [id number] Patient Account Number: [id number] Date of Birth/Sex: Treating RN: 1933/09/26 (86 y.o. [name] Primary Care Acie Custis: Patrina Levering Other Clinician: Referring Margueritte Guthridge: Treating Lunetta Marina/Extender: Evelena Peat in Treatment: 42 Compression Therapy Performed for Wound Assessment: Wound #5 Left,Lateral Lower Leg Performed By: Clinician [name], RN Compression Type: Three Layer Post Procedure Diagnosis Same as Pre-procedure Electronic Signature(s) Signed: 10/13/2020 5:57:05 PM By: [name] RN, BSN Entered By: [name] on 10/13/2020 09:20:19 -------------------------------------------------------------------------------- Encounter Discharge Information Details Patient Name: Date of Service: [name], [place]. 10/13/2020 8:30 A M Medical Record Number: [id number] Patient Account Number: [id number] Date of Birth/Sex: Treating RN: Mar 24, 1933 (86 y.o. [name] Primary Care Liann Spaeth: Patrina Levering Other Clinician: Referring Sue Mcalexander: Treating Jillana Selph/Extender: Evelena Peat in Treatment: 46 Encounter Discharge Information Items Post Procedure Vitals Discharge Condition: Stable Temperature (F): 97.9 Ambulatory Status: Ambulatory Pulse (bpm): 62 Discharge Destination: Home Respiratory Rate (breaths/min): 18 Transportation: Private Auto Blood Pressure (mmHg): 136/80 Accompanied By: spouse Schedule Follow-up Appointment: Yes Clinical Summary of Care: Electronic Signature(s) Signed: 10/13/2020 6:07:40 PM By: Deon Pilling Entered By: Deon Pilling on 10/13/2020 09:39:35 -------------------------------------------------------------------------------- Lower Extremity Assessment Details Patient Name: Date of Service: [place] 10/13/2020 8:30 A M Medical Record Number: [id number] Patient Account Number: [id number] Date of Birth/Sex: Treating RN: Dec 31, 1932 (86 y.o. [name] Primary Care Zaelyn Noack: Lajean Manes T Other Clinician: Referring Maylea Soria: Treating Windy Dudek/Extender: [name] in Treatment: 42 Edema Assessment Assessed: [Left: No] [Right: No] Edema:  [Left: N] [Right: o] Calf Left: Right: Point of Measurement: 41 cm From Medial Instep 29 cm Ankle Left: Right: Point of Measurement: 14 cm From Medial Instep 19.5 cm Electronic Signature(s) Signed: 10/15/2020 9:08:43 AM By: Carlene Coria RN Entered By: Carlene Coria on 10/13/2020 08:47:36 -------------------------------------------------------------------------------- Multi Wound Chart Details Patient Name: Date of Service: [name] 10/13/2020 8:30 A M Medical Record Number: [id number] Patient Account Number: [id number] Date of Birth/Sex: Treating RN: [birth year] (86 y.o. [name] Primary Care Jerilee Space: Patrina Levering Other Clinician: Referring Sorayah Schrodt: Treating [place]a Retana/Extender: Dellia Nims  Secundino Ginger, Hal T Weeks in Treatment: 42 Vital Signs Height(in): 69 Pulse(bpm): 62 Weight(lbs): 163 Blood Pressure(mmHg): 136/80 Body Mass Index(BMI): 24 Temperature(F): 97.9 Respiratory Rate(breaths/min): 18 Photos: [5:No Photos Left, Lateral Lower Leg] [N/A:N/A N/A] Wound Location: [5:Gradually Appeared] [N/A:N/A] Wounding Event: [5:Venous Leg Ulcer] [N/A:N/A] Primary Etiology: [5:Cataracts, Anemia, Arrhythmia,] [N/A:N/A] Comorbid History: [5:Congestive Heart Failure, Hypertension, Peripheral Venous Disease, Received Radiation 03/21/2020] [N/A:N/A] Date Acquired: [5:29] [N/A:N/A] Weeks of Treatment: [5:Open] [N/A:N/A] Wound Status: [5:Yes] [N/A:N/A] Clustered Wound: [5:0.9x0.3x0.1] [N/A:N/A] Measurements L x W x D (cm) [5:0.212] [N/A:N/A] A (cm) : rea [5:0.021] [N/A:N/A] Volume (cm) : [5:57.90%] [N/A:N/A] % Reduction in A [5:rea: 58.00%] [N/A:N/A] % Reduction in Volume: [5:Full Thickness Without Exposed] [N/A:N/A] Classification: [5:Support Structures Medium] [N/A:N/A] Exudate A mount: [5:Serosanguineous] [N/A:N/A] Exudate Type: [5:red, brown] [N/A:N/A] Exudate Color: [5:Medium (34-66%)] [N/A:N/A] Granulation A mount: [5:Red] [N/A:N/A] Granulation  Quality: [5:Medium (34-66%)] [N/A:N/A] Necrotic A mount: [5:Eschar, Adherent Slough] [N/A:N/A] Necrotic Tissue: [5:Fat Layer (Subcutaneous Tissue): Yes N/A] Exposed Structures: [5:Fascia: No Tendon: No Muscle: No Joint: No Bone: No None] [N/A:N/A] Epithelialization: [5:Debridement - Selective/Open Wound N/A] Debridement: Pre-procedure Verification/Time Out 09:18 [N/A:N/A] Taken: [5:Lidocaine 5% topical ointment] [N/A:N/A] Pain Control: [5:Necrotic/Eschar] [N/A:N/A] Tissue Debrided: [5:Skin/Epidermis] [N/A:N/A] Level: [5:0.27] [N/A:N/A] Debridement A (sq cm): [5:rea Curette] [N/A:N/A] Instrument: [5:Minimum] [N/A:N/A] Bleeding: [5:Pressure] [N/A:N/A] Hemostasis A chieved: [5:0] [N/A:N/A] Procedural Pain: [5:0] [N/A:N/A] Post Procedural Pain: [5:Procedure was tolerated well] [N/A:N/A] Debridement Treatment Response: [5:0.9x0.3x0.1] [N/A:N/A] Post Debridement Measurements L x W x D (cm) [5:0.021] [N/A:N/A] Post Debridement Volume: (cm) [5:Compression Therapy] [N/A:N/A] Procedures Performed: [5:Debridement] Treatment Notes Electronic Signature(s) Signed: 10/13/2020 5:37:06 PM By: Linton Ham MD Signed: 10/13/2020 5:57:05 PM By: [name] RN, BSN Entered By: Linton Ham on 10/13/2020 09:22:36 -------------------------------------------------------------------------------- Multi-Disciplinary Care Plan Details Patient Name: Date of Service: [place], [place]. 10/13/2020 8:30 A M Medical Record Number: [id number] Patient Account Number: [id number] Date of Birth/Sex: Treating RN: 08/30/1933 (86 y.o. [name] Primary Care Maigan Bittinger: Patrina Levering Other Clinician: Referring Emerson Barretto: Treating Zedekiah Hinderman/Extender: Evelena Peat in Treatment: 61 Active Inactive Wound/Skin Impairment Nursing Diagnoses: Knowledge deficit related to ulceration/compromised skin integrity Goals: Patient/caregiver will verbalize understanding of skin care  regimen Date Initiated: 12/20/2019 Target Resolution Date: 10/24/2020 Goal Status: Active Interventions: Assess patient/caregiver ability to perform ulcer/skin care regimen upon admission and as needed Provide education on ulcer and skin care Treatment Activities: Skin care regimen initiated : 12/20/2019 Topical wound management initiated : 12/20/2019 Notes: Electronic Signature(s) Signed: 10/13/2020 5:57:05 PM By: [name] RN, BSN Entered By: [name] on 10/13/2020 10:13:45 -------------------------------------------------------------------------------- Pain Assessment Details Patient Name: Date of Service: [name] 10/13/2020 8:30 A M Medical Record Number: [id number] Patient Account Number: [id number] Date of Birth/Sex: Treating RN: 12/19/1932 (86 y.o. [name] Primary Care Aksh Swart: Lajean Manes T Other Clinician: Referring Louvinia Cumbo: Treating Melysa Schroyer/Extender: Evelena Peat in Treatment: 49 Active Problems Location of Pain Severity and Description of Pain Patient Has Paino No Site Locations Pain Management and Medication Current Pain Management: Electronic Signature(s) Signed: 10/15/2020 9:08:43 AM By: Carlene Coria RN Entered By: Carlene Coria on 10/13/2020 08:46:57 -------------------------------------------------------------------------------- Patient/Caregiver Education Details Patient Name: Date of Service: [name] 11/1/2021andnbsp8:30 A M Medical Record Number: [id number] Patient Account Number: [id number] Date of Birth/Gender: Treating RN: [birth year] (86 y.o. [name] Primary Care Physician: Patrina Levering Other Clinician: Referring Physician: Treating Physician/Extender: Evelena Peat in Treatment: 5 Education Assessment Education Provided To: Patient Education Topics Provided  Wound/Skin Impairment: Methods: Explain/Verbal Responses: State content  correctly Electronic Signature(s) Signed: 10/13/2020 5:57:05 PM By: [name] RN, BSN Entered By: [name] on 10/13/2020 11:58:42 -------------------------------------------------------------------------------- Wound Assessment Details Patient Name: Date of Service: [name] 10/13/2020 8:30 A M Medical Record Number: [id number] Patient Account Number: [id number] Date of Birth/Sex: Treating RN: 1933-09-05 (86 y.o. [name] Primary Care Ashtin Melichar: Lajean Manes T Other Clinician: Referring Shanigua Gibb: Treating Tamirah George/Extender: Evelena Peat in Treatment: 42 Wound Status Wound Number: 5 Primary Venous Leg Ulcer Etiology: Wound Location: Left, Lateral Lower Leg Wound Open Wounding Event: Gradually Appeared Status: Date Acquired: 03/21/2020 Comorbid Cataracts, Anemia, Arrhythmia, Congestive Heart Failure, Weeks Of Treatment: 29 History: Hypertension, Peripheral Venous Disease, Received Radiation Clustered Wound: Yes Photos Photo Uploaded By: Mikeal Hawthorne on 10/14/2020 14:05:16 Wound Measurements Length: (cm) 0.9 Width: (cm) 0.3 Depth: (cm) 0.1 Area: (cm) 0.212 Volume: (cm) 0.021 % Reduction in Area: 57.9% % Reduction in Volume: 58% Epithelialization: None Tunneling: No Undermining: No Wound Description Classification: Full Thickness Without Exposed Support Structures Exudate Amount: Medium Exudate Type: Serosanguineous Exudate Color: red, brown Foul Odor After Cleansing: No Slough/Fibrino Yes Wound Bed Granulation Amount: Medium (34-66%) Exposed Structure Granulation Quality: Red Fascia Exposed: No Necrotic Amount: Medium (34-66%) Fat Layer (Subcutaneous Tissue) Exposed: Yes Necrotic Quality: Eschar, Adherent Slough Tendon Exposed: No Muscle Exposed: No Joint Exposed: No Bone Exposed: No Treatment Notes Wound #5 (Left, Lateral Lower Leg) 1. Cleanse With Wound Cleanser Soap and water 2. Periwound  Care Moisturizing lotion 3. Primary Dressing Applied Polymem Ag 4. Secondary Dressing Dry Gauze 6. Support Layer Applied 3 layer compression wrap Notes netting. Electronic Signature(s) Signed: 10/15/2020 9:08:43 AM By: Carlene Coria RN Entered By: Carlene Coria on 10/13/2020 08:48:33 -------------------------------------------------------------------------------- [place] Details Patient Name: Date of Service: [name], [place]. 10/13/2020 8:30 A M Medical Record Number: [id number] Patient Account Number: [id number] Date of Birth/Sex: Treating RN: 1933-10-20 (86 y.o. [name] Primary Care Jarmal Lewelling: Lajean Manes T Other Clinician: Referring Loeta Herst: Treating Dezman Granda/Extender: Evelena Peat in Treatment: 42 Vital Signs Time Taken: 08:46 Temperature (F): 97.9 Height (in): 69 Pulse (bpm): 62 Weight (lbs): 163 Respiratory Rate (breaths/min): 18 Body Mass Index (BMI): 24.1 Blood Pressure (mmHg): 136/80 Reference Range: 80 - 120 mg / dl Electronic Signature(s) Signed: 10/15/2020 9:08:43 AM By: Carlene Coria RN Entered By: Carlene Coria on 10/13/2020 08:46:46

## 2020-10-17 ENCOUNTER — Ambulatory Visit (INDEPENDENT_AMBULATORY_CARE_PROVIDER_SITE_OTHER): Payer: Medicare Other | Admitting: Internal Medicine

## 2020-10-17 ENCOUNTER — Other Ambulatory Visit: Payer: Self-pay

## 2020-10-17 VITALS — BP 120/70

## 2020-10-17 DIAGNOSIS — I493 Ventricular premature depolarization: Secondary | ICD-10-CM | POA: Diagnosis not present

## 2020-10-17 DIAGNOSIS — I5032 Chronic diastolic (congestive) heart failure: Secondary | ICD-10-CM

## 2020-10-17 DIAGNOSIS — I480 Paroxysmal atrial fibrillation: Secondary | ICD-10-CM

## 2020-10-17 MED ORDER — AMIODARONE HCL 200 MG PO TABS: 200.0000 mg | ORAL_TABLET | Freq: Every day | ORAL | 11 refills | Status: DC

## 2020-10-17 NOTE — Progress Notes (Signed)
HPI [name] returns today for followup of palpitations and PVC's and atrial fib. She has a h/o atrial flutter s/p ablation, then developed atrial fib and a rapid VR and underwent AV node ablation and PPM. She developed symptomatic PVC's and has tried multiple meds including flecainide and dronenderone. She has not had syncope. She has minimal palpitations but does note fatigue and weakness. She had nausea on both flecainide and multaq.  Allergies  Allergen Reactions  . Adhesive [Tape] Rash    Including EKG electrodes and the like.  . Codeine Other (See Comments)    unknown  . Latex Itching  . Percodan [Oxycodone-Aspirin] Nausea And Vomiting    Nausea      Current Outpatient Medications  Medication Sig Dispense Refill  . amiodarone (PACERONE) 200 MG tablet Take 1 tablet (200 mg total) by mouth daily. 30 tablet 11  . antiseptic oral rinse (BIOTENE) LIQD 15 mLs by Mouth Rinse route as needed for dry mouth.    . Carboxymethylcellul-Glycerin (LUBRICATING EYE DROPS OP) Apply 1 drop to eye daily as needed (dry eyes).    . Cyanocobalamin (B-12) 1000 MCG TABS Take 1 tablet by mouth daily.    Marland Kitchen ELIQUIS 5 MG TABS tablet TAKE 1 TABLET BY MOUTH  TWICE DAILY 180 tablet 1  . hydrochlorothiazide (HYDRODIURIL) 25 MG tablet Take 1 tablet (25 mg total) by mouth daily. 90 tablet 3  . HYDROcodone-acetaminophen (NORCO) 10-325 MG tablet Take 1 tablet by mouth 3 (three) times daily as needed for pain.    Marland Kitchen levothyroxine (SYNTHROID) 100 MCG tablet Take 100 mcg by mouth daily before breakfast.    . losartan (COZAAR) 100 MG tablet Take 100 mg by mouth daily.    . metoprolol succinate (TOPROL XL) 25 MG 24 hr tablet Take 1 tablet (25 mg total) by mouth daily. 30 tablet 11  . MYRBETRIQ 50 MG TB24 tablet Take 50 mg by mouth daily.    . raNITIdine HCl (WAL-ZAN 75 PO) Take 1 tablet by mouth daily as needed.     . traMADol (ULTRAM) 50 MG tablet Take 50 mg by mouth 3 (three) times daily as needed.    .  triamcinolone cream (KENALOG) 0.1 % Apply 1 application topically daily as needed.     . Vitamin D, Ergocalciferol, (DRISDOL) 50000 units CAPS capsule Take 50,000 Units by mouth every 7 (seven) days.     No current facility-administered medications for this visit.     Past Medical History:  Diagnosis Date  . Allergic rhinitis   . Anemia   . Breast cancer, left breast ([place]) 01/15/15   Invasive Mammary  . Chronic venous insufficiency   . Hematuria    negative workup - Dr. Reece Agar  . Hypertension   . Migraine headache   . MVP (mitral valve prolapse)   . PAF (paroxysmal atrial fibrillation) ([facility])   . SVT (supraventricular tachycardia) ([facility]) 06/2010   s/p AVNRT ablation  . Wears glasses     ROS:   All systems reviewed and negative except as noted in the HPI.   Past Surgical History:  Procedure Laterality Date  . A-FLUTTER ABLATION N/A 02/04/2017   Procedure: A-Flutter Ablation;  Surgeon: Evans Lance, MD;  Location: [facility];  Service: Cardiovascular;  Laterality: N/A;  . ABDOMINAL HYSTERECTOMY    . AV NODE ABLATION N/A 08/17/2017   Procedure: AV Node Ablation;  Surgeon: Evans Lance, MD;  Location: [facility];  Service: Cardiovascular;  Laterality: N/A;  . BREAST LUMPECTOMY WITH RADIOACTIVE SEED LOCALIZATION Left 02/14/2015   Procedure: BREAST LUMPECTOMY WITH RADIOACTIVE SEED LOCALIZATION;  Surgeon: Alphonsa Overall, MD;  Location: [facility];  Service: General;  Laterality: Left;  . BREAST SURGERY  1990   rt br bx  . CARDIAC CATHETERIZATION  2011   ablasion  . CARDIOVERSION N/A 08/10/2017   Procedure: CARDIOVERSION;  Surgeon: Jerline Pain, MD;  [facility];  Service: Cardiovascular;  Laterality: N/A;  . COLONOSCOPY    . DILATION AND CURETTAGE OF UTERUS    . Left Breast Biopsy  01/15/15  . PACEMAKER IMPLANT N/A 08/17/2017   Procedure: Pacemaker Implant;  Surgeon: Evans Lance, MD;  Location: [place];  Service:  Cardiovascular;  Laterality: N/A;  . TUBAL LIGATION       Family History  Problem Relation Age of Onset  . Hypertension Mother   . Heart disease Mother   . Arthritis Mother   . Other Father        hardening of the arteries  . Heart Problems Sister        pacemaker  . Heart attack Brother   . Throat cancer Paternal Uncle   . Breast cancer Sister        early 72s  . Alzheimer's disease Sister   . Pulmonary disease Sister   . Breast cancer Daughter 58       Negative genetic testing   . Breast cancer Paternal Aunt        dx over 77  . Cancer Paternal Aunt        cancer in her leg  . Breast cancer Cousin        multiple paternal cousin     Social History   Socioeconomic History  . Marital status: Married    Spouse name: Not on file  . Number of children: 3  . Years of education: Not on file  . Highest education level: Not on file  Occupational History  . Not on file  Tobacco Use  . Smoking status: Never Smoker  . Smokeless tobacco: Never Used  . Tobacco comment: did smoke about 2cigs long time about 54 plus years ago  Vaping Use  . Vaping Use: Never used  Substance and Sexual Activity  . Alcohol use: Yes    Alcohol/week: 0.0 standard drinks    Comment: once a week  . Drug use: No  . Sexual activity: Not on file  Other Topics Concern  . Not on file  Social History Narrative   LIves with husband.    Retired.    Social Determinants of Health   Financial Resource Strain:   . Difficulty of Paying Living Expenses: Not on file  Food Insecurity:   . Worried About Charity fundraiser in the Last Year: Not on file  . Ran Out of Food in the Last Year: Not on file  Transportation Needs:   . Lack of Transportation (Medical): Not on file  . Lack of Transportation (Non-Medical): Not on file  Physical Activity:   . Days of Exercise per Week: Not on file  . Minutes of Exercise per Session: Not on file  Stress:   . Feeling of Stress : Not on file  Social  Connections:   . Frequency of Communication with Friends and Family: Not on file  . Frequency of Social Gatherings with Friends and Family: Not on file  . Attends Religious Services: Not on file  .  Active Member of Clubs or Organizations: Not on file  . Attends Archivist Meetings: Not on file  . Marital Status: Not on file  Intimate Partner Violence:   . Fear of Current or Ex-Partner: Not on file  . Emotionally Abused: Not on file  . Physically Abused: Not on file  . Sexually Abused: Not on file     BP 120/70   LMP  (LMP Unknown)   Physical Exam:  Well appearing NAD HEENT: Unremarkable Neck:  No JVD, no thyromegally Lymphatics:  No adenopathy Back:  No CVA tenderness Lungs:  Clear with no wheezes HEART:  Regular rate rhythm, no murmurs, no rubs, no clicks Abd:  soft, positive bowel sounds, no organomegally, no rebound, no guarding Ext:  2 plus pulses, no edema, no cyanosis, no clubbing Skin:  No rashes no nodules Neuro:  CN II through XII intact, motor grossly intact  EKG - atrial fib with ventricular pacing and PVC's  DEVICE  Normal device function.  See PaceArt for details.   Assess/Plan: 1. Symptomatic PVC's - I am worried about a tachy induced CM due to PVC's and have recommended a trial of amiodarone 200 mg daily. 2. Atrial fib - her rates are well controlled after AV node ablation. 3. Coags - she has not had any bleeding on Eliquis. 4. HTN - her bp is well controlled. She will continue cozaar and a beta blocker.  Carleene Overlie Amelya Mabry,MD

## 2020-10-17 NOTE — Patient Instructions (Addendum)
Medication Instructions:  Your physician has recommended you make the following change in your medication:   1.  START taking amiodarone 200 mg-  Take one tablet by mouth daily at bedtime  Labwork: None ordered.  Testing/Procedures: None ordered.  Follow-Up:  Your physician wants you to follow-up in:  November 28, 2020 at 2:45 pm at the [facility] office  Any Other Special Instructions Will Be Listed Below (If Applicable).  If you need a refill on your cardiac medications before your next appointment, please call your pharmacy.   Amiodarone tablets What is this medicine? AMIODARONE (a MEE oh da rone) is an antiarrhythmic drug. It helps make your heart beat regularly. Because of the side effects caused by this medicine, it is only used when other medicines have not worked. It is usually used for heartbeat problems that may be life threatening. This medicine may be used for other purposes; ask your health care provider or pharmacist if you have questions. COMMON BRAND NAME(S): Cordarone, Pacerone What should I tell my health care provider before I take this medicine? They need to know if you have any of these conditions:  liver disease  lung disease  other heart problems  thyroid disease  an unusual or allergic reaction to amiodarone, iodine, other medicines, foods, dyes, or preservatives  pregnant or trying to get pregnant  breast-feeding How should I use this medicine? Take this medicine by mouth with a glass of water. Follow the directions on the prescription label. You can take this medicine with or without food. However, you should always take it the same way each time. Take your doses at regular intervals. Do not take your medicine more often than directed. Do not stop taking except on the advice of your doctor or health care professional. A special MedGuide will be given to you by the pharmacist with each prescription and refill. Be sure to read this information  carefully each time. Talk to your pediatrician regarding the use of this medicine in children. Special care may be needed. Overdosage: If you think you have taken too much of this medicine contact a poison control center or emergency room at once. NOTE: This medicine is only for you. Do not share this medicine with others. What if I miss a dose? If you miss a dose, take it as soon as you can. If it is almost time for your next dose, take only that dose. Do not take double or extra doses. What may interact with this medicine? Do not take this medicine with any of the following medications:  abarelix  apomorphine  arsenic trioxide  certain antibiotics like erythromycin, gemifloxacin, levofloxacin, pentamidine  certain medicines for depression like amoxapine, tricyclic antidepressants  certain medicines for fungal infections like fluconazole, itraconazole, ketoconazole, posaconazole, voriconazole  certain medicines for irregular heart beat like disopyramide, dronedarone, ibutilide, propafenone, sotalol  certain medicines for malaria like chloroquine, halofantrine  cisapride  droperidol  haloperidol  haw[place]orn  maprotiline  methadone  phenothiazines like chlorpromazine, mesoridazine, thioridazine  pimozide  ranolazine  red yeast rice  vardenafil This medicine may also interact with the following medications:  antiviral medicines for HIV or AIDS  certain medicines for blood pressure, heart disease, irregular heart beat  certain medicines for cholesterol like atorvastatin, cerivastatin, lovastatin, simvastatin  certain medicines for hepatitis C like sofosbuvir and ledipasvir; sofosbuvir  certain medicines for seizures like phenytoin  certain medicines for thyroid problems  certain medicines that treat or prevent blood clots like  warfarin  cholestyramine  cimetidine  clopidogrel  cyclosporine  dextromethorphan  diuretics  dofetilide  fentanyl  general anesthetics  grapefruit juice  lidocaine  loratadine  methotrexate  other medicines that prolong the QT interval (cause an abnormal heart rhythm)  procainamide  quinidine  rifabutin, rifampin, or rifapentine  St. John's Wort  trazodone  ziprasidone This list may not describe all possible interactions. Give your health care provider a list of all the medicines, herbs, non-prescription drugs, or dietary supplements you use. Also tell them if you smoke, drink alcohol, or use illegal drugs. Some items may interact with your medicine. What should I watch for while using this medicine? Your condition will be monitored closely when you first begin therapy. Often, this drug is first started in a hospital or other monitored health care setting. Once you are on maintenance therapy, visit your doctor or health care professional for regular checks on your progress. Because your condition and use of this medicine carry some risk, it is a good idea to carry an identification card, necklace or bracelet with details of your condition, medications, and doctor or health care professional. Dennis Bast may get drowsy or dizzy. Do not drive, use machinery, or do anything that needs mental alertness until you know how this medicine affects you. Do not stand or sit up quickly, especially if you are an older patient. This reduces the risk of dizzy or fainting spells. This medicine can make you more sensitive to the sun. Keep out of the sun. If you cannot avoid being in the sun, wear protective clothing and use sunscreen. Do not use sun lamps or tanning beds/booths. You should have regular eye exams before and during treatment. Call your doctor if you have blurred vision, see halos, or your eyes become sensitive to light. Your eyes may get dry. It may be helpful to use a  lubricating eye solution or artificial tears solution. If you are going to have surgery or a procedure that requires contrast dyes, tell your doctor or health care professional that you are taking this medicine. What side effects may I notice from receiving this medicine? Side effects that you should report to your doctor or health care professional as soon as possible:  allergic reactions like skin rash, itching or hives, swelling of the face, lips, or tongue  blue-gray coloring of the skin  blurred vision, seeing blue green halos, increased sensitivity of the eyes to light  breathing problems  chest pain  dark urine  fast, irregular heartbeat  feeling faint or light-headed  intolerance to heat or cold  nausea or vomiting  pain and swelling of the scrotum  pain, tingling, numbness in feet, hands  redness, blistering, peeling or loosening of the skin, including inside the mouth  spitting up blood  stomach pain  sweating  unusual or uncontrolled movements of body  unusually weak or tired  weight gain or loss  yellowing of the eyes or skin Side effects that usually do not require medical attention (report to your doctor or health care professional if they continue or are bothersome):  change in sex drive or performance  constipation  dizziness  headache  loss of appetite  trouble sleeping This list may not describe all possible side effects. Call your doctor for medical advice about side effects. You may report side effects to FDA at 1-800-FDA-1088. Where should I keep my medicine? Keep out of the reach of children. Store at room temperature between 20 and 25 degrees C (68 and 77 degrees F).  Protect from light. Keep container tightly closed. Throw away any unused medicine after the expiration date. NOTE: This sheet is a summary. It may not cover all possible information. If you have questions about this medicine, talk to your doctor, pharmacist, or health  care provider.  2020 [employer] (2018-11-01 13:44:04)

## 2020-10-20 ENCOUNTER — Other Ambulatory Visit: Payer: Self-pay

## 2020-10-20 ENCOUNTER — Encounter (HOSPITAL_BASED_OUTPATIENT_CLINIC_OR_DEPARTMENT_OTHER): Payer: Medicare Other | Admitting: Internal Medicine

## 2020-10-20 DIAGNOSIS — I87321 Chronic venous hypertension (idiopathic) with inflammation of right lower extremity: Secondary | ICD-10-CM | POA: Diagnosis not present

## 2020-10-21 NOTE — Progress Notes (Signed)
[name], [name] ([id number]) Visit Report for 10/20/2020 Debridement Details Patient Name: Date of Service: [name], [name] 10/20/2020 8:30 A M Medical Record Number: [id number] Patient Account Number: [id number] Date of Birth/Sex: Treating RN: 30-Jan-1933 (86 y.o. [name] Primary Care Provider: Lajean Green Green Other Clinician: Referring Provider: Treating Provider/Extender: [name] in Treatment: 43 Debridement Performed for Assessment: Wound #5 Left,Lateral Lower Leg Performed By: Physician [name]., MD Debridement Type: Debridement Severity of Tissue Pre Debridement: Fat layer exposed Level of Consciousness (Pre-procedure): Awake and Alert Pre-procedure Verification/Time Out Yes - 09:15 Taken: Start Time: 09:15 Pain Control: Lidocaine 5% topical ointment Green Area Debrided (L x W): otal 0.5 (cm) x 0.5 (cm) = 0.25 (cm) Tissue and other material debrided: Viable, Non-Viable, Subcutaneous, Skin: Epidermis Level: Skin/Subcutaneous Tissue Debridement Description: Excisional Instrument: Curette Bleeding: Minimum Hemostasis Achieved: Pressure End Time: 09:16 Procedural Pain: 0 Post Procedural Pain: 0 Response to Treatment: Procedure was tolerated well Level of Consciousness (Post- Awake and Alert procedure): Post Debridement Measurements of Total Wound Length: (cm) 0.5 Width: (cm) 0.3 Depth: (cm) 0.1 Volume: (cm) 0.012 Character of Wound/Ulcer Post Debridement: Improved Severity of Tissue Post Debridement: Fat layer exposed Post Procedure Diagnosis Same as Pre-procedure Electronic Signature(s) Signed: 10/20/2020 6:16:40 PM By: Tracy Hurst RN, BSN Signed: 10/21/2020 3:11:24 PM By: Tracy Ham MD Entered By: [name] on 10/20/2020 09:37:01 -------------------------------------------------------------------------------- HPI Details Patient Name: Date of Service: Tracy Banker J. 10/20/2020 8:30 A M Medical  Record Number: [id number] Patient Account Number: [id number] Date of Birth/Sex: Treating RN: 1932/12/23 (86 y.o. [name] Primary Care Provider: Patrina Green Other Clinician: Referring Provider: Treating Provider/Extender: [name] in Treatment: 92 History of Present Illness HPI Description: ADMISSION 12/20/2019 This is an 86 year old woman referred by her primary physician [name] for review of a wound on her right lateral lower leg. She was actually in this clinic on 2 separate occasions in 2010 and 2012 cared for by [name]. At that point in time she had wounds on her right leg as well. She tells [place] to 1 month ago she noticed a scab building up on her right lateral lower leg this opened into a wound. There was no overt cause of this no trauma, no infection that she is aware of. She has a history of chronic venous insufficiency and wears compression stockings fairly religiously indeed she is done well over the last 8 years since she was last in this clinic. She has been applying Vaseline on this and a covering phone. This is not progressing towards healing. Past medical history; interstitial lung disease, chronic atrial fibrillation status post pacemaker, osteoarthritis of the left knee, left breast CA, chronic repeat venous insufficiency. She takes Eliquis for her atrial fibrillation stroke prophylaxis. ABI in our clinic was 0.99 on the right 1/15; superficial wound on the right lateral calf in the setting of severe acute skin changes from chronic venous insufficiency and lymphedema. We used Iodoflex last week she complained of a lot of pain 1/22; this is a small but difficult wound on the right lateral calf in the setting of severe chronic venous insufficiency and secondary lymphedema. She continues to state the wounds things hurts when she is up on it but seems to be relieved by putting her leg up. I changed her to [facility] last  week because the Iodoflex seem to be causing stinging. 1/29. This wound appears to be contracting somewhat. Changes of  chronic venous insufficiency with secondary lymphedema 2/12; not as good as surface today and slightly bigger. I had to change her from Iodoflex to [facility] because of the stinging pain although she does not think it was any better on the [facility]. She comes into clinic today with a area on the medial left great toe. She said she noticed blood on her sock last Saturday. She had some form of bunion surgery by Dr. Ila Green her podiatrist sometime in 2019 she said he "shave the area". I could not find his operative report although I did see reference to the bunion in the area. 2/19; somewhat improved wound on the right lateral lower leg however the wound she came in on the bunion of her left great toe is actually I think larger still somewhat inflamed. We have been using Iodoflex 2/26; not much improvement in either wound area which is the original venous wound on the right lateral lower leg and in the area on the bunion of her left great toe. This still looks somewhat inflamed and tender. We have been using Iodoflex with open much improvement. 3/4; in general both her wounds look better this includes the original venous insufficiency wound on the right lateral lower leg and the area on her tip of her bunion on the left great toe medial aspect of the MTP. Change to Hydrofera Blue last week 3/12; the patient still has a small geographic shaped wound on the right lateral lower leg. We have been using Hydrofera Blue but I changed her to endoform today. She also has the area in the tip of the bunion of the left great toe. We will try Hydrofera Blue here as well 3/19; the small geographic wound on the right lateral lower leg has not filled in. There is some surrounding induration we have noticed this previously. This may be venous inflammation but I wonder about biopsying  this if this is not closing up. The area over the left medial first MTP [bunion deformity] requires debridement. This is not closing in. I am not convinced she is offloading adequately 3/26; right lateral lower leg in the setting of severe venous insufficiency and also an area over the first MTP bunion deformity. No debridement is required. We have been using endoform 4/9; right lateral lower leg wound in the setting of severe venous insufficiency and also a refractory area over the first MTP bunion deformity. The area on the right lateral lower leg is just about closed there is still a minor open area here. She comes in today with a mirror image area on the left lateral lower leg. She says this started as a scab 2 weeks ago and is gradually morphed into an open wound very similar to what she has on the right leg. She has severe bilateral venous insufficiency with venous hypertension obvious from clinical exam. 4/16; her original wound the right lateral leg wound in the setting of chronic venous hypertension is a small open area but very small. We have been using endoform. Her wound over the left first MTP bunion deformity also appears to be small and closing in. Unfortunately she has a large relative area on the left lateral calf which was new last week. Very adherent debris over this we have been using Iodoflex however that may be contributing to the debris on the wound surface 4/23; the original wound is closed and the area on the left first metatarsal bunion deformity is also almost closed the new area from  last week required debridement. She went for her reflux studies that did not take her lower extremity wraps off. This is not helpful. She did have significant reflux in the right common femoral vein. I am not going to press this issue further. She clinically has severe venous hypertension 4/29; the original wound is closed on the right lateral lower calf. The area on the left first  metatarsal/bunion deformity has a small slitlike opening that is still not closed. The real problem here is now wound on the left lateral calf which is necrotic and deep. We used Iodoflex on this wound last time. Silver alginate on the left first toe The patient has [place] wraps. We should be able to transition the right leg into compression stockings and were doing this today. 5/7; the original wound remains closed on the right lateral calf. The left first metatarsal head still is open. Deterioration in the wound which is the new wound from several weeks ago on the left lateral calf. The patient is not aware how this could have happened. We have been using Sorbact starting last week under 3 layer compression 5/14; the right lateral calf is closed the area on the first met head on the left is closed However the new area from 3 weeks ago on the left lateral calf continues to expand. There is raised nodular tissue around the wound necrotic debris on the surface. Circumference of the wound also looks necrotic. It looks as though there is involvement of the tissue under the skin around the large area of this wound which looks threatened. She is complaining of pain. Culture of this wound I did last week showed rare coag negative staph variant I have never heard of although I am doubtful this has clinical significance 05/09/20-Patient returns with a left lateral calf wound looking about the same, the biopsies reviewed show no atypical features and consistent with trauma or pressure injury or stasis change, patient has appointment to be scheduled with vascular We are using silver alginate and 3 layer compression Patient was started on Trental by dermatology which she has been taking for a week now 6/4; left lateral calf wound. I reviewed the dermatology notes with Dr. Martin Majestic from [facility] dermatology. See suggested the possibility of livedoid vasculopathy possibility of additional biopsies which I think  would have to be punch biopsies. Put her on pentoxifylline noteworthy that she is on Eliquis as well. We have been using silver alginate under compression. She has an appointment for repeat vascular studies on 7/6. This gets back to the fact that they did not take the wrap off on the original studies that were done. I do not believe she has an arterial issue. 6/10; left lateral calf wound. I put her on Hydrofera Blue last week. Some improvement in the surface condition of the wound. She has repeat vascular studies/reflux studies on 7/6. We are trying to get Apligraf through [place] [employer]. Her husband states he looked on the website this is not going to be approved. I am really not sure if this is a "class effect" 6/18; somewhat surprisingly Apligraf was approved and 100% covered. Her husband was also quite shocked by this. Nevertheless we have her repeat vascular studies on 7/6 and I will not be able to apply this until this is done. We are using Hydrofera Blue to the wound on the left. Her original wound on the right lower leg has maintain closure using compression stocking 6/25; repeat vascular studies/venous on 7/6; we will apply  the Apligraf I think that week. We are also going to work around a week's vacation they have the first week in August. 7/2; reflux studies on 7/6;. Likely place first Apligraf from the major wound area on 7/9; 7/9; patient was kindly seen by Dr. Arbie Cookey of vein and vascular to review her circulation status with the refractory wounds in the left leg. At first wounds in this clinic were on the right leg and they heal she now has a juxta lite stocking. Dr. Arbie Cookey thought she had venous stasis disease with venous ulcerations. He did not feel that there was anything that would suggest to expect approval with a blush ablation of her saphenous vein the saphenous veins were felt to be relatively small in caliber. She was not felt to have an arterial issue Apligraf #1 apply  today in the standard fashion 7/23; the patient came in today complaining of a lot of pain in the left lower leg where the wound is. We applied Apligraf 2 weeks ago I also increased her compression from 3-4 layer wondering about the venous hypertension. She arrives today with her original wound that we have been treating with Apligraf looking quite healthy although there is some surface slough. Problematically she has de-epithelialized and developed erythema below the wound towards the lateral malleolus itself. This is somewhat angry. I think this is venous stasis loss of epithelialization. I do not believe this is infection . Apligraf #2 applied to the original wound area 8/9-Patient returns at 2 weeks for Apligraf #3, the left leg venous leg ulcer area had more slough this time 8/23 Apligraf #4 her wound areas are small however she is still complaining of pain. She has previously been seen by vein and vascular. She has not felt to have an arterial issue she was felt to have venous stasis disease. She originally came here with a wound on her right leg on the right lateral calf this healed out. She then developed a difficult area on the left. She saw dermatology wondered whether this could be a vasculopathy. I am not sure if she is still on pentoxifylline the dermatology suggested I will have to clarify this with 9/7; patient complains of unrelenting pain making her life miserable. I been using Apligraf to close her major wound down and the major wound has come down to 2 open areas which are small and granulated. However there is erythema both inferiorly and superiorly cratered thick skin with wound issues between these areas extending into the lateral calf. There is erythema here and tenderness. I have been managing this largely as this this was severe stasis dermatitis. Vein and vascular did not feel that she would benefit from an ablation. She went to see [facility] dermatology some months ago who  wondered about a vasculopathy and suggested a biopsy but I am reluctant to go forward with that here. The patient is on Eliquis. 9/13; the patient saw Dr. Nicholas Lose on 9/8; she noted superficial cutaneous breakdown located on her left inferior lateral posterior tibia. She does not have a specific diagnosis for the patient's ulcer. Wanted to rule out squamous cell carcinoma and pyoderma gangrenosum she did a punch biopsy that is still bleeding. They came back for a dressing change after that procedure. They have still not heard the results of the biopsy. Was still using silver alginate as the primary dressing. She is tolerating the doxycycline although she had to take it with food 9/20; the biopsy that was done by dermatology/Dr. Nicholas Lose on  9/8 showed findings consistent with stasis dermatitis. I think we can put any thought of an alternative diagnosis to rest here. She is already seen vein and vascular today did not think she was a candidate for ablations. They felt she had adequate arterial flow. We have been using silver alginate and TCA I put her on doxycycline last time because of erythema around the wound which very well could be venous inflammation/stasis dermatitis but I had some concerns about cellulitis. Doxycycline being a good drug for this because of its anti-inflammatory as well as its antibiotic effects. We also received a note from the patient's cardiologist Dr. Darene Lamer aylor about interactions between doxycycline and propafenone. While I was aware of the potential interaction between propafenone and quinolones I was not aware of any interaction between doxycycline and propafenone. Nor can I find this interaction described. Nevertheless I will follow Dr. Tanna Furry expertise in this area. She was having nausea in any case and was about to stop it even without cardiology advice. She was also in the ER because of bleeding from the biopsy site and in urgent care because of nausea 9/27 weekly follow-up.  Area on the left lateral calf venous insufficiency wounds with at one point severe stasis dermatitis. She is actually doing quite nicely. She has 4 or 5 small open areas. Some of them with eschared surfaces which I removed with a #5 curette. The skin around here looks excellent. Using silver alginate Liberal TCA under compression 10/4; left lateral calf venous insufficiency wounds. Using silver alginate 10/11; left lateral venous insufficiency wounds. She has 3 areas that we are watching now. These almost form a line. The most distal part is definitely still open. Superiorly the next area is eschared which does not look 100% closed where is the most proximal area appears closed. We are using silver alginate under compression. She has made a nice improvement. She tells me that she has bought a small exercise bike one of the portable ones that. She is using this and this actually may be helpful 10/18; left lateral venous insufficiency wounds. 1 small opening remains almost completely closed. We have been using silver alginate under compression 11/1; left lateral venous insufficiency wounds. I thought this would be closed. Using silver alginate. Arrives in clinic with dry skin and callus over the wound surface. I removed this with a #5 curette clean wound. Change the primary dressing to polymen 11/8; left lateral leg venous insufficiency wounds. Very tiny open area inferiorly is still not closed. She has been using [employer]) Signed: 10/21/2020 3:11:24 PM By: Tracy Ham MD Entered By: [name] on 10/20/2020 09:37:33 -------------------------------------------------------------------------------- Physical Exam Details Patient Name: Date of Service: Tracy Banker J. 10/20/2020 8:30 A M Medical Record Number: [id number] Patient Account Number: [id number] Date of Birth/Sex: Treating RN: 1933/05/27 (86 y.o. [name] Primary Care Provider: Patrina Green Other  Clinician: Referring Provider: Treating Provider/Extender: [name] in Treatment: 68 Constitutional Sitting or standing Blood Pressure is within target range for patient.. Pulse regular and within target range for patient.Marland Kitchen Respirations regular, non-labored and within target range.. Temperature is normal and within the target range for the patient.Marland Kitchen Appears in no distress. Cardiovascular Pedal pulses palpable. Notes Wound exam; left lateral ankle and calf. The only thing open is at the inferior part of this original wound dry callus some debris underneath the callus all debrided with a #3 curette. She is left with a clean bleeding small open area  that is not fully epithelialized the edema control is good there is no evidence of underlying infection Electronic Signature(s) Signed: 10/21/2020 3:11:24 PM By: Tracy Ham MD Signed: 10/21/2020 3:11:24 PM By: Tracy Ham MD Entered By: [name] on 10/20/2020 09:38:46 -------------------------------------------------------------------------------- Physician Orders Details Patient Name: Date of Service: [name] 10/20/2020 8:30 A M Medical Record Number: [id number] Patient Account Number: [id number] Date of Birth/Sex: Treating RN: [birth year] (86 y.o. [name] Primary Care Provider: Lajean Green Green Other Clinician: Referring Provider: Treating Provider/Extender: [name] in Treatment: 91 Verbal / Phone Orders: No Diagnosis Coding ICD-10 Coding Code Description I87.321 Chronic venous hypertension (idiopathic) with inflammation of right lower extremity L97.828 Non-pressure chronic ulcer of other part of left lower leg with other specified severity I87.322 Chronic venous hypertension (idiopathic) with inflammation of left lower extremity L97.522 Non-pressure chronic ulcer of other part of left foot with fat layer exposed Follow-up  Appointments Return Appointment in 1 week. Dressing Change Frequency Wound #5 Left,Lateral Lower Leg Do not change entire dressing for one week. Skin Barriers/Peri-Wound Care Moisturizing lotion Wound Cleansing May shower with protection. - use cast protector Primary Wound Dressing Wound #5 Left,Lateral Lower Leg Polymem Silver Secondary Dressing Wound #5 Left,Lateral Lower Leg Dry Gauze Edema Control 3 Layer Compression System - Left Lower Extremity Avoid standing for long periods of time Elevate legs to the level of the heart or above for 30 minutes daily and/or when sitting, a frequency of: - throughout the day. Exercise regularly Support Garment 20-30 mm/Hg pressure to: - patient to apply farrow wrap 4000 to right leg. Apply in the morning and remove at night. Off-Loading Open toe surgical shoe to: - left foot Electronic Signature(s) Signed: 10/20/2020 6:16:40 PM By: Tracy Hurst RN, BSN Signed: 10/21/2020 3:11:24 PM By: Tracy Ham MD Entered By: [name] on 10/20/2020 09:19:21 -------------------------------------------------------------------------------- Problem List Details Patient Name: Date of Service: [place], [place]. 10/20/2020 8:30 A M Medical Record Number: [id number] Patient Account Number: [id number] Date of Birth/Sex: Treating RN: [birth year] (86 y.o. [name] Primary Care Provider: Lajean Green Green Other Clinician: Referring Provider: Treating Provider/Extender: [name] in Treatment: 8 Active Problems ICD-10 Encounter Code Description Active Date MDM Diagnosis I87.321 Chronic venous hypertension (idiopathic) with inflammation of right lower 12/20/2019 No Yes extremity L97.828 Non-pressure chronic ulcer of other part of left lower leg with other specified 03/21/2020 No Yes severity I87.322 Chronic venous hypertension (idiopathic) with inflammation of left lower 02/14/2020 No Yes extremity L97.522  Non-pressure chronic ulcer of other part of left foot with fat layer exposed 01/25/2020 No Yes Inactive Problems ICD-10 Code Description Active Date Inactive Date L97.812 Non-pressure chronic ulcer of other part of right lower leg with fat layer exposed 12/20/2019 12/20/2019 Resolved Problems Electronic Signature(s) Signed: 10/21/2020 3:11:24 PM By: Tracy Ham MD Entered By: [name] on 10/20/2020 09:36:15 -------------------------------------------------------------------------------- Progress Note Details Patient Name: Date of Service: Tracy Banker J. 10/20/2020 8:30 A M Medical Record Number: [id number] Patient Account Number: [id number] Date of Birth/Sex: Treating RN: 07-Jul-1933 (86 y.o. [name] Primary Care Provider: Patrina Green Other Clinician: Referring Provider: Treating Provider/Extender: [name] in Treatment: 34 Subjective History of Present Illness (HPI) ADMISSION 12/20/2019 This is an 86 year old woman referred by her primary physician [name] for review of a wound on her right lateral lower leg. She was actually in this clinic on 2 separate occasions in 2010 and 2012 cared  for by [name]. At that point in time she had wounds on her right leg as well. She tells [place] to 1 month ago she noticed a scab building up on her right lateral lower leg this opened into a wound. There was no overt cause of this no trauma, no infection that she is aware of. She has a history of chronic venous insufficiency and wears compression stockings fairly religiously indeed she is done well over the last 8 years since she was last in this clinic. She has been applying Vaseline on this and a covering phone. This is not progressing towards healing. Past medical history; interstitial lung disease, chronic atrial fibrillation status post pacemaker, osteoarthritis of the left knee, left breast CA, chronic repeat venous insufficiency. She takes  Eliquis for her atrial fibrillation stroke prophylaxis. ABI in our clinic was 0.99 on the right 1/15; superficial wound on the right lateral calf in the setting of severe acute skin changes from chronic venous insufficiency and lymphedema. We used Iodoflex last week she complained of a lot of pain 1/22; this is a small but difficult wound on the right lateral calf in the setting of severe chronic venous insufficiency and secondary lymphedema. She continues to state the wounds things hurts when she is up on it but seems to be relieved by putting her leg up. I changed her to [facility] last week because the Iodoflex seem to be causing stinging. 1/29. This wound appears to be contracting somewhat. Changes of chronic venous insufficiency with secondary lymphedema 2/12; not as good as surface today and slightly bigger. I had to change her from Iodoflex to [facility] because of the stinging pain although she does not think it was any better on the [facility]. ooShe comes into clinic today with a area on the medial left great toe. She said she noticed blood on her sock last Saturday. She had some form of bunion surgery by Dr. Ila Green her podiatrist sometime in 2019 she said he "shave the area". I could not find his operative report although I did see reference to the bunion in the area. 2/19; somewhat improved wound on the right lateral lower leg however the wound she came in on the bunion of her left great toe is actually I think larger still somewhat inflamed. We have been using Iodoflex 2/26; not much improvement in either wound area which is the original venous wound on the right lateral lower leg and in the area on the bunion of her left great toe. This still looks somewhat inflamed and tender. We have been using Iodoflex with open much improvement. 3/4; in general both her wounds look better this includes the original venous insufficiency wound on the right lateral lower leg and the  area on her tip of her bunion on the left great toe medial aspect of the MTP. Change to Hydrofera Blue last week 3/12; the patient still has a small geographic shaped wound on the right lateral lower leg. We have been using Hydrofera Blue but I changed her to endoform today. She also has the area in the tip of the bunion of the left great toe. We will try Hydrofera Blue here as well 3/19; the small geographic wound on the right lateral lower leg has not filled in. There is some surrounding induration we have noticed this previously. This may be venous inflammation but I wonder about biopsying this if this is not closing up. The area over the left medial first MTP [  bunion deformity] requires debridement. This is not closing in. I am not convinced she is offloading adequately 3/26; right lateral lower leg in the setting of severe venous insufficiency and also an area over the first MTP bunion deformity. No debridement is required. We have been using endoform 4/9; right lateral lower leg wound in the setting of severe venous insufficiency and also a refractory area over the first MTP bunion deformity. The area on the right lateral lower leg is just about closed there is still a minor open area here. She comes in today with a mirror image area on the left lateral lower leg. She says this started as a scab 2 weeks ago and is gradually morphed into an open wound very similar to what she has on the right leg. She has severe bilateral venous insufficiency with venous hypertension obvious from clinical exam. 4/16; her original wound the right lateral leg wound in the setting of chronic venous hypertension is a small open area but very small. We have been using endoform. Her wound over the left first MTP bunion deformity also appears to be small and closing in. Unfortunately she has a large relative area on the left lateral calf which was new last week. Very adherent debris over this we have been using Iodoflex  however that may be contributing to the debris on the wound surface 4/23; the original wound is closed and the area on the left first metatarsal bunion deformity is also almost closed the new area from last week required debridement. She went for her reflux studies that did not take her lower extremity wraps off. This is not helpful. She did have significant reflux in the right common femoral vein. I am not going to press this issue further. She clinically has severe venous hypertension 4/29; the original wound is closed on the right lateral lower calf. The area on the left first metatarsal/bunion deformity has a small slitlike opening that is still not closed. The real problem here is now wound on the left lateral calf which is necrotic and deep. We used Iodoflex on this wound last time. Silver alginate on the left first toe The patient has [place] wraps. We should be able to transition the right leg into compression stockings and were doing this today. 5/7; the original wound remains closed on the right lateral calf. The left first metatarsal head still is open. Deterioration in the wound which is the new wound from several weeks ago on the left lateral calf. The patient is not aware how this could have happened. We have been using Sorbact starting last week under 3 layer compression 5/14; the right lateral calf is closed the area on the first met head on the left is closed However the new area from 3 weeks ago on the left lateral calf continues to expand. There is raised nodular tissue around the wound necrotic debris on the surface. Circumference of the wound also looks necrotic. It looks as though there is involvement of the tissue under the skin around the large area of this wound which looks threatened. She is complaining of pain. Culture of this wound I did last week showed rare coag negative staph variant I have never heard of although I am doubtful this has clinical  significance 05/09/20-Patient returns with a left lateral calf wound looking about the same, the biopsies reviewed show no atypical features and consistent with trauma or pressure injury or stasis change, patient has appointment to be scheduled with vascular We are  using silver alginate and 3 layer compression Patient was started on Trental by dermatology which she has been taking for a week now 6/4; left lateral calf wound. I reviewed the dermatology notes with Dr. Martin Majestic from [facility] dermatology. 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 Put her on pentoxifylline noteworthy that she is on Eliquis as well. We have been using silver alginate under compression. She has an appointment for repeat vascular studies on 7/6. This gets back to the fact that they did not take the wrap off on the original studies that were done. I do not believe she has an arterial issue. 6/10; left lateral calf wound. I put her on Hydrofera Blue last week. Some improvement in the surface condition of the wound. She has repeat vascular studies/reflux studies on 7/6. We are trying to get Apligraf through [place] [employer]. Her husband states he looked on the website this is not going to be approved. I am really not sure if this is a "class effect" 6/18; somewhat surprisingly Apligraf was approved and 100% covered. Her husband was also quite shocked by this. Nevertheless we have her repeat vascular studies on 7/6 and I will not be able to apply this until this is done. We are using Hydrofera Blue to the wound on the left. Her original wound on the right lower leg has maintain closure using compression stocking 6/25; repeat vascular studies/venous on 7/6; we will apply the Apligraf I think that week. We are also going to work around a week's vacation they have the first week in August. 7/2; reflux studies on 7/6;. Likely place  first Apligraf from the major wound area on 7/9; 7/9; patient was kindly seen by Dr. Donnetta Hutching of vein and vascular to review her circulation status with the refractory wounds in the left leg. At first wounds in this clinic were on the right leg and they heal she now has a juxta lite stocking. Dr. Donnetta Hutching thought she had venous stasis disease with venous ulcerations. He did not feel that there was anything that would suggest to expect approval with a blush ablation of her saphenous vein the saphenous veins were felt to be relatively small in caliber. She was not felt to have an arterial issue Apligraf #1 apply today in the standard fashion 7/23; the patient came in today complaining of a lot of pain in the left lower leg where the wound is. We applied Apligraf 2 weeks ago I also increased her compression from 3-4 layer wondering about the venous hypertension. She arrives today with her original wound that we have been treating with Apligraf looking quite healthy although there is some surface slough. Problematically she has de-epithelialized and developed erythema below the wound towards the lateral malleolus itself. This is somewhat angry. I think this is venous stasis loss of epithelialization. I do not believe this is infection . Apligraf #2 applied to the original wound area 8/9-Patient returns at 2 weeks for Apligraf #3, the left leg venous leg ulcer area had more slough this time 8/23 Apligraf #4 her wound areas are small however she is still complaining of pain. She has previously been seen by vein and vascular. She has not felt to have an arterial issue she was felt to have venous stasis disease. She originally came here with a wound on her right leg on the right lateral calf this healed out. She then developed a difficult area on the left.  She saw dermatology wondered whether this could be a vasculopathy. I am not sure if she is still on pentoxifylline the dermatology suggested I will have to  clarify this with 9/7; patient complains of unrelenting pain making her life miserable. I been using Apligraf to close her major wound down and the major wound has come down to 2 open areas which are small and granulated. However there is erythema both inferiorly and superiorly cratered thick skin with wound issues between these areas extending into the lateral calf. There is erythema here and tenderness. I have been managing this largely as this this was severe stasis dermatitis. Vein and vascular did not feel that she would benefit from an ablation. She went to see [facility] dermatology some months ago who wondered about a vasculopathy and suggested a biopsy but I am reluctant to go forward with that here. The patient is on Eliquis. 9/13; the patient saw Dr. Nicholas Lose on 9/8; she noted superficial cutaneous breakdown located on her left inferior lateral posterior tibia. She does not have a specific diagnosis for the patient's ulcer. Wanted to rule out squamous cell carcinoma and pyoderma gangrenosum she did a punch biopsy that is still bleeding. They came back for a dressing change after that procedure. They have still not heard the results of the biopsy. Was still using silver alginate as the primary dressing. She is tolerating the doxycycline although she had to take it with food 9/20; the biopsy that was done by dermatology/Dr. Nicholas Lose on 9/8 showed findings consistent with stasis dermatitis. I think we can put any thought of an alternative diagnosis to rest here. She is already seen vein and vascular today did not think she was a candidate for ablations. They felt she had adequate arterial flow. We have been using silver alginate and TCA I put her on doxycycline last time because of erythema around the wound which very well could be venous inflammation/stasis dermatitis but I had some concerns about cellulitis. Doxycycline being a good drug for this because of its anti-inflammatory as well as its  antibiotic effects. We also received a note from the patient's cardiologist Dr. Karie Schwalbe aylor about interactions between doxycycline and propafenone. While I was aware of the potential interaction between propafenone and quinolones I was not aware of any interaction between doxycycline and propafenone. Nor can I find this interaction described. Nevertheless I will follow Dr. Lubertha Basque expertise in this area. She was having nausea in any case and was about to stop it even without cardiology advice. She was also in the ER because of bleeding from the biopsy site and in urgent care because of nausea 9/27 weekly follow-up. Area on the left lateral calf venous insufficiency wounds with at one point severe stasis dermatitis. She is actually doing quite nicely. She has 4 or 5 small open areas. Some of them with eschared surfaces which I removed with a #5 curette. The skin around here looks excellent. Using silver alginate Liberal TCA under compression 10/4; left lateral calf venous insufficiency wounds. Using silver alginate 10/11; left lateral venous insufficiency wounds. She has 3 areas that we are watching now. These almost form a line. The most distal part is definitely still open. Superiorly the next area is eschared which does not look 100% closed where is the most proximal area appears closed. We are using silver alginate under compression. She has made a nice improvement. She tells me that she has bought a small exercise bike one of the portable ones that. She is  using this and this actually may be helpful 10/18; left lateral venous insufficiency wounds. 1 small opening remains almost completely closed. We have been using silver alginate under compression 11/1; left lateral venous insufficiency wounds. I thought this would be closed. Using silver alginate. Arrives in clinic with dry skin and callus over the wound surface. I removed this with a #5 curette clean wound. Change the primary dressing to  polymen 11/8; left lateral leg venous insufficiency wounds. Very tiny open area inferiorly is still not closed. She has been using polymen Objective Constitutional Sitting or standing Blood Pressure is within target range for patient.. Pulse regular and within target range for patient.Marland Kitchen Respirations regular, non-labored and within target range.. Temperature is normal and within the target range for the patient.Marland Kitchen Appears in no distress. Vitals Time Taken: 8:36 AM, Height: 69 in, Source: Stated, Weight: 163 lbs, Source: Stated, BMI: 24.1, Temperature: 97.5 F, Pulse: 46 bpm, Respiratory Rate: 18 breaths/min, Blood Pressure: 139/74 mmHg. Cardiovascular Pedal pulses palpable. General Notes: Wound exam; left lateral ankle and calf. The only thing open is at the inferior part of this original wound dry callus some debris underneath the callus all debrided with a #3 curette. She is left with a clean bleeding small open area that is not fully epithelialized the edema control is good there is no evidence of underlying infection Integumentary (Hair, Skin) Wound #5 status is Open. Original cause of wound was Gradually Appeared. The wound is located on the Left,Lateral Lower Leg. The wound measures 0.5cm length x 0.3cm width x 0.1cm depth; 0.118cm^2 area and 0.012cm^3 volume. There is no tunneling or undermining noted. There is a small amount of serous drainage noted. There is no granulation within the wound bed. There is no necrotic tissue within the wound bed. 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 There was a Excisional Skin/Subcutaneous Tissue Debridement with a total area of 0.25 sq cm performed by [name]., MD. With the following instrument(s): Curette to remove Viable and Non-Viable tissue/material. Material removed includes Subcutaneous Tissue and Skin: Epidermis and after achieving pain control using Lidocaine 5% topical ointment. No specimens were taken. A time out was conducted at 09:15, prior to the start of the procedure. A Minimum amount of bleeding was controlled with Pressure. The procedure was tolerated well with a pain level of 0 throughout and a pain level of 0 following the procedure. Post Debridement Measurements: 0.5cm length x 0.3cm width x 0.1cm depth; 0.012cm^3 volume. Character of Wound/Ulcer Post Debridement is improved. Severity of Tissue Post Debridement is: Fat layer exposed. Post procedure Diagnosis Wound #5: Same as Pre-Procedure Pre-procedure diagnosis of Wound #5 is a Venous Leg Ulcer located on the Left,Lateral Lower Leg . There was a Three Layer Compression Therapy Procedure by Tracy Hurst, RN. Post procedure Diagnosis Wound #5: Same as Pre-Procedure Plan Follow-up Appointments: Return Appointment in 1 week. Dressing Change Frequency: Wound #5 Left,Lateral Lower Leg: Do not change entire dressing for one week. Skin Barriers/Peri-Wound Care: Moisturizing lotion Wound Cleansing: May shower with protection. - use cast protector Primary Wound Dressing:  Wound #5 Left,Lateral Lower Leg: Polymem Silver Secondary Dressing: Wound #5 Left,Lateral Lower Leg: Dry Gauze Edema Control: 3 Layer Compression System - Left Lower Extremity Avoid standing for long periods of time Elevate legs to the level of the heart or above for 30 minutes daily and/or when sitting, a frequency of: - throughout the day. Exercise regularly Support Garment 20-30 mm/Hg  pressure to: - patient to apply farrow wrap 4000 to right leg. Apply in the morning and remove at night. Off-Loading: Open toe surgical shoe to: - left foot 1. Still polymen silver 2. Still 3 layer compression 3. She has a juxta light stocking awaiting closure. 4. Severe venous hypertension Electronic Signature(s) Signed: 10/21/2020 3:11:24 PM By: Tracy Ham MD Entered By: [name] on 10/20/2020 09:39:27 -------------------------------------------------------------------------------- SuperBill Details Patient Name: Date of Service: [name] 10/20/2020 Medical Record Number: [id number] Patient Account Number: [id number] Date of Birth/Sex: Treating RN: [birth year] (86 y.o. [name] Primary Care Provider: Lajean Green Green Other Clinician: Referring Provider: Treating Provider/Extender: [name] in Treatment: 43 Diagnosis Coding ICD-10 Codes Code Description [phone] Chronic venous hypertension (idiopathic) with inflammation of right lower extremity L97.828 Non-pressure chronic ulcer of other part of left lower leg with other specified severity I87.322 Chronic venous hypertension (idiopathic) with inflammation of left lower extremity L97.522 Non-pressure chronic ulcer of other part of left foot with fat layer exposed Facility Procedures The patient participates with Medicare or their insurance follows the Medicare Facility Guidelines: CPT4 Code Description Modifier Quantity [id number] 11042 - DEB SUBQ TISSUE 20 SQ CM/< 1 ICD-10 Diagnosis Description L97.828 Non-pressure chronic ulcer of  other part of left lower leg with other specified severity I87.322 Chronic venous hypertension (idiopathic) with inflammation of left lower extremity Physician Procedures : CPT4 Code Description Modifier [id number] - WC PHYS SUBQ TISS 20 SQ CM ICD-10 Diagnosis Description L97.828 Non-pressure chronic ulcer of other part of left lower leg with other  specified severity I87.322 Chronic venous hypertension (idiopathic)  with inflammation of left lower extremity Quantity: 1 Electronic Signature(s) Signed: 10/21/2020 3:11:24 PM By: Tracy Ham MD Entered By: [name] on 10/20/2020 09:39:57

## 2020-10-21 NOTE — Progress Notes (Signed)
MAKAIYA, GEERDES ([id number]) Visit Report for 10/20/2020 Arrival Information Details Patient Name: Date of Service: [place], [name] 10/20/2020 8:30 A M Medical Record Number: [id number] Patient Account Number: [id number] Date of Birth/Sex: Treating RN: 1933-11-17 (86 y.o. [name] Primary Care Danajah Birdsell: Lajean Manes T Other Clinician: Referring Renita Brocks: Treating Momo Braun/Extender: Evelena Peat in Treatment: 40 Visit Information History Since Last Visit Added or deleted any medications: Yes Patient Arrived: Ambulatory Any new allergies or adverse reactions: No Arrival Time: 08:35 Had a fall or experienced change in No Accompanied By: spouse activities of daily living that may affect Transfer Assistance: None risk of falls: Patient Identification Verified: Yes Signs or symptoms of abuse/neglect since last visito No Secondary Verification Process Completed: Yes Hospitalized since last visit: No Patient Requires Transmission-Based Precautions: No Implantable device outside of the clinic excluding No Patient Has Alerts: Yes cellular tissue based products placed in the center Patient Alerts: Patient on Blood Thinner since last visit: Right ABI:0.99 Has Dressing in Place as Prescribed: Yes Has Compression in Place as Prescribed: Yes Pain Present Now: No Electronic Signature(s) Signed: 10/20/2020 4:55:08 PM By: [name] RN, BSN Entered By: [name] on 10/20/2020 08:36:46 -------------------------------------------------------------------------------- Compression Therapy Details Patient Name: Date of Service: [name] 10/20/2020 8:30 A M Medical Record Number: [id number] Patient Account Number: [id number] Date of Birth/Sex: Treating RN: 16-Jun-1933 (86 y.o. [name] Primary Care Starlyn Droge: Patrina Levering Other Clinician: Referring Honesti Seaberg: Treating Carel Carrier/Extender: Evelena Peat in  Treatment: 73 Compression Therapy Performed for Wound Assessment: Wound #5 Left,Lateral Lower Leg Performed By: Clinician [name], RN Compression Type: Three Layer Post Procedure Diagnosis Same as Pre-procedure Electronic Signature(s) Signed: 10/20/2020 6:16:40 PM By: [name] RN, BSN Entered By: [name] on 10/20/2020 09:19:04 -------------------------------------------------------------------------------- Encounter Discharge Information Details Patient Name: Date of Service: [name], [place]. 10/20/2020 8:30 A M Medical Record Number: [id number] Patient Account Number: [id number] Date of Birth/Sex: Treating RN: 02-28-1933 (86 y.o. [name] Primary Care Bellarose Burtt: Patrina Levering Other Clinician: Referring Frimet Durfee: Treating Agnes Probert/Extender: Evelena Peat in Treatment: 49 Encounter Discharge Information Items Post Procedure Vitals Discharge Condition: Stable Temperature (F): 97.5 Ambulatory Status: Ambulatory Pulse (bpm): 46 Discharge Destination: Home Respiratory Rate (breaths/min): 18 Transportation: Private Auto Blood Pressure (mmHg): 139/74 Accompanied By: husband Schedule Follow-up Appointment: Yes Clinical Summary of Care: Electronic Signature(s) Signed: 10/20/2020 5:46:03 PM By: Deon Pilling Entered By: Deon Pilling on 10/20/2020 09:36:04 -------------------------------------------------------------------------------- Lower Extremity Assessment Details Patient Name: Date of Service: ASJA, [place] 10/20/2020 8:30 A M Medical Record Number: [id number] Patient Account Number: [id number] Date of Birth/Sex: Treating RN: May 07, 1933 (86 y.o. [name] Primary Care Jalesia Loudenslager: Lajean Manes T Other Clinician: Referring Corynn Solberg: Treating Fae Blossom/Extender: [name] in Treatment: 43 Edema Assessment Assessed: [Left: No] [Right: No] Edema: [Left: N] [Right: o] Calf Left:  Right: Point of Measurement: 41 cm From Medial Instep 30.5 cm Ankle Left: Right: Point of Measurement: 14 cm From Medial Instep 19 cm Vascular Assessment Pulses: Dorsalis Pedis Palpable: [Left:Yes] Electronic Signature(s) Signed: 10/20/2020 4:55:08 PM By: [name] RN, BSN Entered By: [name] on 10/20/2020 08:40:32 -------------------------------------------------------------------------------- Multi Wound Chart Details Patient Name: Date of Service: [name] 10/20/2020 8:30 A M Medical Record Number: [id number] Patient Account Number: [id number] Date of Birth/Sex: Treating RN: 04-07-1933 (86 y.o. [name] Primary Care Kambree Krauss: Patrina Levering Other Clinician: Referring Dyneshia Baccam: Treating Krisalyn Yankowski/Extender:  Rudean Hitt, Hal T Weeks in Treatment: 43 Vital Signs Height(in): 69 Pulse(bpm): 46 Weight(lbs): 163 Blood Pressure(mmHg): 139/74 Body Mass Index(BMI): 24 Temperature(F): 97.5 Respiratory Rate(breaths/min): 18 Photos: [5:No Photos Left, Lateral Lower Leg] [N/A:N/A N/A] Wound Location: [5:Gradually Appeared] [N/A:N/A] Wounding Event: [5:Venous Leg Ulcer] [N/A:N/A] Primary Etiology: [5:Cataracts, Anemia, Arrhythmia,] [N/A:N/A] Comorbid History: [5:Congestive Heart Failure, Hypertension, Peripheral Venous Disease, Received Radiation 03/21/2020] [N/A:N/A] Date Acquired: [5:30] [N/A:N/A] Weeks of Treatment: [5:Open] [N/A:N/A] Wound Status: [5:Yes] [N/A:N/A] Clustered Wound: [5:0.5x0.3x0.1] [N/A:N/A] Measurements L x W x D (cm) [5:0.118] [N/A:N/A] A (cm) : rea [5:0.012] [N/A:N/A] Volume (cm) : [5:76.50%] [N/A:N/A] % Reduction in A [5:rea: 76.00%] [N/A:N/A] % Reduction in Volume: [5:Full Thickness Without Exposed] [N/A:N/A] Classification: [5:Support Structures Small] [N/A:N/A] Exudate A mount: [5:Serous] [N/A:N/A] Exudate Type: [5:amber] [N/A:N/A] Exudate Color: [5:None Present (0%)] [N/A:N/A] Granulation A mount:  [5:None Present (0%)] [N/A:N/A] Necrotic A mount: [5:Fascia: No] [N/A:N/A] Exposed Structures: [5:Fat Layer (Subcutaneous Tissue): No Tendon: No Muscle: No Joint: No Bone: No Large (67-100%)] [N/A:N/A] Epithelialization: [5:Debridement - Excisional] [N/A:N/A] Debridement: Pre-procedure Verification/Time Out 09:15 [N/A:N/A] Taken: [5:Lidocaine 5% topical ointment] [N/A:N/A] Pain Control: [5:Subcutaneous] [N/A:N/A] Tissue Debrided: [5:Skin/Subcutaneous Tissue] [N/A:N/A] Level: [5:0.25] [N/A:N/A] Debridement A (sq cm): [5:rea Curette] [N/A:N/A] Instrument: [5:Minimum] [N/A:N/A] Bleeding: [5:Pressure] [N/A:N/A] Hemostasis A chieved: [5:0] [N/A:N/A] Procedural Pain: [5:0] [N/A:N/A] Post Procedural Pain: [5:Procedure was tolerated well] [N/A:N/A] Debridement Treatment Response: [5:0.5x0.3x0.1] [N/A:N/A] Post Debridement Measurements L x W x D (cm) [5:0.012] [N/A:N/A] Post Debridement Volume: (cm) [5:Compression Therapy] [N/A:N/A] Procedures Performed: [5:Debridement] Treatment Notes Wound #5 (Left, Lateral Lower Leg) 1. Cleanse With Wound Cleanser Soap and water 2. Periwound Care Moisturizing lotion 3. Primary Dressing Applied Polymem Ag 4. Secondary Dressing Dry Gauze 6. Support Layer Applied 3 layer compression wrap Notes netting. Electronic Signature(s) Signed: 10/20/2020 6:16:40 PM By: [name] RN, BSN Signed: 10/21/2020 3:11:24 PM By: Linton Ham MD Entered By: Linton Ham on 10/20/2020 09:36:22 -------------------------------------------------------------------------------- Multi-Disciplinary Care Plan Details Patient Name: Date of Service: [place], [name] 10/20/2020 8:30 A M Medical Record Number: [id number] Patient Account Number: [id number] Date of Birth/Sex: Treating RN: 1933-09-20 (86 y.o. [name] Primary Care Kuron Docken: Patrina Levering Other Clinician: Referring Lasasha Brophy: Treating Jowell Bossi/Extender: Evelena Peat in Treatment: 32 Active Inactive Wound/Skin Impairment Nursing Diagnoses: Knowledge deficit related to ulceration/compromised skin integrity Goals: Patient/caregiver will verbalize understanding of skin care regimen Date Initiated: 12/20/2019 Target Resolution Date: 11/21/2020 Goal Status: Active Interventions: Assess patient/caregiver ability to perform ulcer/skin care regimen upon admission and as needed Provide education on ulcer and skin care Treatment Activities: Skin care regimen initiated : 12/20/2019 Topical wound management initiated : 12/20/2019 Notes: Electronic Signature(s) Signed: 10/20/2020 6:16:40 PM By: [name] RN, BSN Entered By: [name] on 10/20/2020 09:20:26 -------------------------------------------------------------------------------- Pain Assessment Details Patient Name: Date of Service: [name] 10/20/2020 8:30 A M Medical Record Number: [id number] Patient Account Number: [id number] Date of Birth/Sex: Treating RN: [birth year] (86 y.o. [name] Primary Care Kaneshia Cater: Lajean Manes T Other Clinician: Referring Yukio Bisping: Treating Nelta Caudill/Extender: Evelena Peat in Treatment: 38 Active Problems Location of Pain Severity and Description of Pain Patient Has Paino Yes Site Locations Pain Location: Pain in Ulcers With Dressing Change: Yes Duration of the Pain. Constant / Intermittento Intermittent Rate the pain. Current Pain Level: 0 Worst Pain Level: 3 Character of Pain Describe the Pain: Aching Pain Management and Medication Current Pain Management: Other: tolerable Is the Current Pain Management Adequate: Adequate How does your wound impact  your activities of daily livingo Sleep: No Bathing: No Appetite: No Relationship With Others: No Bladder Continence: No Emotions: No Bowel Continence: No Work: No Toileting: No Drive: No Dressing: No Hobbies: No Electronic  Signature(s) Signed: 10/20/2020 4:55:08 PM By: [name] RN, BSN Entered By: [name] on 10/20/2020 08:38:08 -------------------------------------------------------------------------------- Patient/Caregiver Education Details Patient Name: Date of Service: [name] 11/8/2021andnbsp8:30 A M Medical Record Number: [id number] Patient Account Number: [id number] Date of Birth/Gender: Treating RN: 07-26-1933 (86 y.o. [name] Primary Care Physician: Patrina Levering Other Clinician: Referring Physician: Treating Physician/Extender: Evelena Peat in Treatment: 48 Education Assessment Education Provided To: Patient Education Topics Provided Wound/Skin Impairment: Methods: Explain/Verbal Responses: State content correctly [employer]) Signed: 10/20/2020 6:16:40 PM By: [name] RN, BSN Entered By: [name] on 10/20/2020 09:12:20 -------------------------------------------------------------------------------- Wound Assessment Details Patient Name: Date of Service: [name] 10/20/2020 8:30 A M Medical Record Number: [id number] Patient Account Number: [id number] Date of Birth/Sex: Treating RN: March 07, 1933 (86 y.o. [name] Primary Care Hadley Soileau: Lajean Manes T Other Clinician: Referring Aamirah Salmi: Treating Alesia Oshields/Extender: Evelena Peat in Treatment: 43 Wound Status Wound Number: 5 Primary Venous Leg Ulcer Etiology: Wound Location: Left, Lateral Lower Leg Wound Open Wounding Event: Gradually Appeared Status: Date Acquired: 03/21/2020 Comorbid Cataracts, Anemia, Arrhythmia, Congestive Heart Failure, Weeks Of Treatment: 30 History: Hypertension, Peripheral Venous Disease, Received Radiation Clustered Wound: Yes Photos Photo Uploaded By: Mikeal Hawthorne on 10/21/2020 09:05:25 Wound Measurements Length: (cm) 0.5 Width: (cm) 0.3 Depth: (cm) 0.1 Area: (cm)  0.118 Volume: (cm) 0.012 % Reduction in Area: 76.5% % Reduction in Volume: 76% Epithelialization: Large (67-100%) Tunneling: No Undermining: No Wound Description Classification: Full Thickness Without Exposed Support Structures Exudate Amount: Small Exudate Type: Serous Exudate Color: amber Foul Odor After Cleansing: No Slough/Fibrino No Wound Bed Granulation Amount: None Present (0%) Exposed Structure Necrotic Amount: None Present (0%) Fascia Exposed: No Fat Layer (Subcutaneous Tissue) Exposed: No Tendon Exposed: No Muscle Exposed: No Joint Exposed: No Bone Exposed: No Treatment Notes Wound #5 (Left, Lateral Lower Leg) 1. Cleanse With Wound Cleanser Soap and water 2. Periwound Care Moisturizing lotion 3. Primary Dressing Applied Polymem Ag 4. Secondary Dressing Dry Gauze 6. Support Layer Applied 3 layer compression wrap Notes netting. Electronic Signature(s) Signed: 10/20/2020 6:16:40 PM By: [name] RN, BSN Entered By: [name] on 10/20/2020 09:19:47 -------------------------------------------------------------------------------- Wilder Details Patient Name: Date of Service: [name], [place] J. 10/20/2020 8:30 A M Medical Record Number: [id number] Patient Account Number: [id number] Date of Birth/Sex: Treating RN: 11-04-1933 (86 y.o. [name] Primary Care Giorgia Wahler: Lajean Manes T Other Clinician: Referring Samariyah Cowles: Treating Eder Macek/Extender: Evelena Peat in Treatment: 10 Vital Signs Time Taken: 08:36 Temperature (F): 97.5 Height (in): 69 Pulse (bpm): 46 Source: Stated Respiratory Rate (breaths/min): 18 Weight (lbs): 163 Blood Pressure (mmHg): 139/74 Source: Stated Reference Range: 80 - 120 mg / dl Body Mass Index (BMI): 24.1 Electronic Signature(s) Signed: 10/20/2020 4:55:08 PM By: [name] RN, BSN Entered By: [name] on 10/20/2020 08:37:21

## 2020-10-27 ENCOUNTER — Other Ambulatory Visit: Payer: Self-pay

## 2020-10-27 ENCOUNTER — Encounter (HOSPITAL_BASED_OUTPATIENT_CLINIC_OR_DEPARTMENT_OTHER): Payer: Medicare Other | Admitting: Internal Medicine

## 2020-10-27 DIAGNOSIS — I87321 Chronic venous hypertension (idiopathic) with inflammation of right lower extremity: Secondary | ICD-10-CM | POA: Diagnosis not present

## 2020-10-28 NOTE — Progress Notes (Signed)
[name], [place] ([id number]) Visit Report for 10/27/2020 Arrival Information Details Patient Name: Date of Service: [place], [name] 10/27/2020 8:30 A M Medical Record Number: [id number] Patient Account Number: [id number] Date of Birth/Sex: Treating RN: 12/08/1933 (86 y.o. [name] Primary Care Johnmark Geiger: Lajean Manes T Other Clinician: Referring Avriel Kandel: Treating Carling Liberman/Extender: Evelena Peat in Treatment: 46 Visit Information History Since Last Visit All ordered tests and consults were completed: No Patient Arrived: Ambulatory Added or deleted any medications: No Arrival Time: 08:36 Any new allergies or adverse reactions: No Accompanied By: husband Had a fall or experienced change in No Transfer Assistance: None activities of daily living that may affect Patient Identification Verified: Yes risk of falls: Secondary Verification Process Completed: Yes Signs or symptoms of abuse/neglect since last visito No Patient Requires Transmission-Based Precautions: No Hospitalized since last visit: No Patient Has Alerts: Yes Implantable device outside of the clinic excluding No Patient Alerts: Patient on Blood Thinner cellular tissue based products placed in the center Right ABI:0.99 since last visit: Has Dressing in Place as Prescribed: Yes Has Compression in Place as Prescribed: Yes Pain Present Now: No Electronic Signature(s) Signed: 10/27/2020 5:50:45 PM By: Carlene Coria RN Entered By: Carlene Coria on 10/27/2020 08:37:06 -------------------------------------------------------------------------------- Clinic Level of Care Assessment Details Patient Name: Date of Service: [name], [name] 10/27/2020 8:30 A M Medical Record Number: [id number] Patient Account Number: [id number] Date of Birth/Sex: Treating RN: 03-Oct-1933 (86 y.o. [name] Primary Care Fernand Sorbello: Lajean Manes T Other Clinician: Referring Dodie Parisi: Treating  Takeshia Wenk/Extender: Evelena Peat in Treatment: 55 Clinic Level of Care Assessment Items TOOL 4 Quantity Score X- 1 0 Use when only an EandM is performed on FOLLOW-UP visit ASSESSMENTS - Nursing Assessment / Reassessment X- 1 10 Reassessment of Co-morbidities (includes updates in patient status) X- 1 5 Reassessment of Adherence to Treatment Plan ASSESSMENTS - Wound and Skin A ssessment / Reassessment X - Simple Wound Assessment / Reassessment - one wound 1 5 []  - 0 Complex Wound Assessment / Reassessment - multiple wounds []  - 0 Dermatologic / Skin Assessment (not related to wound area) ASSESSMENTS - Focused Assessment []  - 0 Circumferential Edema Measurements - multi extremities []  - 0 Nutritional Assessment / Counseling / Intervention X- 1 5 Lower Extremity Assessment (monofilament, tuning fork, pulses) []  - 0 Peripheral Arterial Disease Assessment (using hand held doppler) ASSESSMENTS - Ostomy and/or Continence Assessment and Care []  - 0 Incontinence Assessment and Management []  - 0 Ostomy Care Assessment and Management (repouching, etc.) PROCESS - Coordination of Care X - Simple Patient / Family Education for ongoing care 1 15 []  - 0 Complex (extensive) Patient / Family Education for ongoing care X- 1 10 Staff obtains Programmer, systems, Records, T Results / Process Orders est []  - 0 Staff telephones HHA, Nursing Homes / Clarify orders / etc []  - 0 Routine Transfer to another Facility (non-emergent condition) []  - 0 Routine Hospital Admission (non-emergent condition) []  - 0 New Admissions / Biomedical engineer / Ordering NPWT Apligraf, etc. , []  - 0 Emergency Hospital Admission (emergent condition) X- 1 10 Simple Discharge Coordination []  - 0 Complex (extensive) Discharge Coordination PROCESS - Special Needs []  - 0 Pediatric / Minor Patient Management []  - 0 Isolation Patient Management []  - 0 Hearing / Language / Visual special  needs []  - 0 Assessment of Community assistance (transportation, D/C planning, etc.) []  - 0 Additional assistance / Altered mentation []  - 0 Support Surface(s) Assessment (bed,  cushion, seat, etc.) INTERVENTIONS - Wound Cleansing / Measurement X - Simple Wound Cleansing - one wound 1 5 []  - 0 Complex Wound Cleansing - multiple wounds X- 1 5 Wound Imaging (photographs - any number of wounds) []  - 0 Wound Tracing (instead of photographs) X- 1 5 Simple Wound Measurement - one wound []  - 0 Complex Wound Measurement - multiple wounds INTERVENTIONS - Wound Dressings []  - 0 Small Wound Dressing one or multiple wounds []  - 0 Medium Wound Dressing one or multiple wounds []  - 0 Large Wound Dressing one or multiple wounds []  - 0 Application of Medications - topical []  - 0 Application of Medications - injection INTERVENTIONS - Miscellaneous []  - 0 External ear exam []  - 0 Specimen Collection (cultures, biopsies, blood, body fluids, etc.) []  - 0 Specimen(s) / Culture(s) sent or taken to Lab for analysis []  - 0 Patient Transfer (multiple staff / Civil Service fast streamer / Similar devices) []  - 0 Simple Staple / Suture removal (25 or less) []  - 0 Complex Staple / Suture removal (26 or more) []  - 0 Hypo / Hyperglycemic Management (close monitor of Blood Glucose) []  - 0 Ankle / Brachial Index (ABI) - do not check if billed separately X- 1 5 Vital Signs Has the patient been seen at the hospital within the last three years: Yes Total Score: 80 Level Of Care: New/Established - Level 3 Electronic Signature(s) Signed: 10/27/2020 5:51:53 PM By: [name] RN, BSN Entered By: [name] on 10/27/2020 08:54:48 -------------------------------------------------------------------------------- Encounter Discharge Information Details Patient Name: Date of Service: [place] Bank[place] J. 10/27/2020 8:30 A M Medical Record Number: [id number] Patient Account Number: [id number] Date of Birth/Sex:  Treating RN: 07-13-1933 (86 y.o. [name] Primary Care Cordell Coke: Patrina Levering Other Clinician: Referring Roise Emert: Treating Lular Letson/Extender: Evelena Peat in Treatment: 79 Encounter Discharge Information Items Discharge Condition: Stable Ambulatory Status: Ambulatory Discharge Destination: Home Transportation: Private Auto Accompanied By: husband Schedule Follow-up Appointment: No Clinical Summary of Care: Notes foam pad for protection to closed area. farrow wrap 4000 applied to left leg. Electronic Signature(s) Signed: 10/27/2020 6:05:18 PM By: Deon Pilling Entered By: Deon Pilling on 10/27/2020 09:16:49 -------------------------------------------------------------------------------- Lower Extremity Assessment Details Patient Name: Date of Service: [place], [name] 10/27/2020 8:30 A M Medical Record Number: [id number] Patient Account Number: [id number] Date of Birth/Sex: Treating RN: 06/18/1933 (86 y.o. [name] Primary Care Adeleine Pask: Lajean Manes T Other Clinician: Referring Jayron Maqueda: Treating Arisbel Maione/Extender: [name] in Treatment: 44 Edema Assessment Assessed: [Left: No] [Right: No] Edema: [Left: N] [Right: o] Calf Left: Right: Point of Measurement: 41 cm From Medial Instep 30 cm Ankle Left: Right: Point of Measurement: 14 cm From Medial Instep 18.5 cm Electronic Signature(s) Signed: 10/27/2020 5:50:45 PM By: Carlene Coria RN Entered By: Carlene Coria on 10/27/2020 08:38:36 -------------------------------------------------------------------------------- Multi Wound Chart Details Patient Name: Date of Service: [name] 10/27/2020 8:30 A M Medical Record Number: [id number] Patient Account Number: [id number] Date of Birth/Sex: Treating RN: 13-Aug-1933 (86 y.o. [name] Primary Care Latania Bascomb: Patrina Levering Other Clinician: Referring Stevey Stapleton: Treating  Traevon Meiring/Extender: [name] in Treatment: 54 Vital Signs Height(in): 69 Pulse(bpm): 44 Weight(lbs): 163 Blood Pressure(mmHg): 136/68 Body Mass Index(BMI): 24 Temperature(F): 97.7 Respiratory Rate(breaths/min): 18 Photos: [5:No Photos Left, Lateral Lower Leg] [N/A:N/A N/A] Wound Location: [5:Gradually Appeared] [N/A:N/A] Wounding Event: [5:Venous Leg Ulcer] [N/A:N/A] Primary Etiology: [5:Cataracts, Anemia, Arrhythmia,] [N/A:N/A] Comorbid History: [5:Congestive Heart Failure, Hypertension, Peripheral Venous  Disease, Received Radiation 03/21/2020] [N/A:N/A] Date Acquired: [5:31] [N/A:N/A] Weeks of Treatment: [5:Healed - Epithelialized] [N/A:N/A] Wound Status: [5:Yes] [N/A:N/A] Clustered Wound: [5:0x0x0] [N/A:N/A] Measurements L x W x D (cm) [5:0] [N/A:N/A] A (cm) : rea [5:0] [N/A:N/A] Volume (cm) : [5:100.00%] [N/A:N/A] % Reduction in Area: [5:100.00%] [N/A:N/A] % Reduction in Volume: [5:Full Thickness Without Exposed] [N/A:N/A] Classification: [5:Support Structures None Present] [N/A:N/A] Exudate Amount: [5:None Present (0%)] [N/A:N/A] Granulation Amount: [5:None Present (0%)] [N/A:N/A] Necrotic Amount: [5:Fascia: No] [N/A:N/A] Exposed Structures: [5:Fat Layer (Subcutaneous Tissue): No Tendon: No Muscle: No Joint: No Bone: No Large (67-100%)] [N/A:N/A] Treatment Notes Electronic Signature(s) Signed: 10/27/2020 5:39:18 PM By: Linton Ham MD Signed: 10/27/2020 5:51:53 PM By: [name] RN, BSN Entered By: Linton Ham on 10/27/2020 09:02:03 -------------------------------------------------------------------------------- Multi-Disciplinary Care Plan Details Patient Name: Date of Service: [place], [place] J. 10/27/2020 8:30 A M Medical Record Number: [id number] Patient Account Number: [id number] Date of Birth/Sex: Treating RN: 1933-07-11 (86 y.o. [name] Primary Care Jaelin Devincentis: Patrina Levering Other Clinician: Referring  Aminata Buffalo: Treating Adem Costlow/Extender: Evelena Peat in Treatment: 61 Active Inactive Electronic Signature(s) Signed: 10/27/2020 5:51:53 PM By: [name] RN, BSN Entered By: [name] on 10/27/2020 08:55:05 -------------------------------------------------------------------------------- Pain Assessment Details Patient Name: Date of Service: [name] 10/27/2020 8:30 A M Medical Record Number: [id number] Patient Account Number: [id number] Date of Birth/Sex: Treating RN: 23-Jul-1933 (86 y.o. [name] Primary Care Jailon Schaible: Lajean Manes T Other Clinician: Referring Arnell Mausolf: Treating Lanis Storlie/Extender: Evelena Peat in Treatment: 29 Active Problems Location of Pain Severity and Description of Pain Patient Has Paino No Site Locations Pain Management and Medication Current Pain Management: Electronic Signature(s) Signed: 10/27/2020 5:50:45 PM By: Carlene Coria RN Entered By: Carlene Coria on 10/27/2020 08:37:49 -------------------------------------------------------------------------------- Patient/Caregiver Education Details Patient Name: Date of Service: [name] 11/15/2021andnbsp8:30 A M Medical Record Number: [id number] Patient Account Number: [id number] Date of Birth/Gender: Treating RN: 11/18/1933 (86 y.o. [name] Primary Care Physician: Lajean Manes T Other Clinician: Referring Physician: Treating Physician/Extender: Evelena Peat in Treatment: 20 Education Assessment Education Provided To: Patient Education Topics Provided Wound/Skin Impairment: Methods: Explain/Verbal Responses: State content correctly [employer]) Signed: 10/27/2020 5:51:53 PM By: [name] RN, BSN Entered By: [name] on 10/27/2020 08:49:08 -------------------------------------------------------------------------------- Wound Assessment  Details Patient Name: Date of Service: [place] Banker J. 10/27/2020 8:30 A M Medical Record Number: [id number] Patient Account Number: [id number] Date of Birth/Sex: Treating RN: Aug 23, 1933 (86 y.o. [name] Primary Care Briston Lax: Lajean Manes T Other Clinician: Referring Marieliz Strang: Treating Shealyn Sean/Extender: Evelena Peat in Treatment: 44 Wound Status Wound Number: 5 Primary Venous Leg Ulcer Etiology: Wound Location: Left, Lateral Lower Leg Wound Healed - Epithelialized Wounding Event: Gradually Appeared Status: Date Acquired: 03/21/2020 Comorbid Cataracts, Anemia, Arrhythmia, Congestive Heart Failure, Weeks Of Treatment: 31 History: Hypertension, Peripheral Venous Disease, Received Radiation Clustered Wound: Yes Wound Measurements Length: (cm) Width: (cm) Depth: (cm) Area: (cm) Volume: (cm) 0 % Reduction in Area: 100% 0 % Reduction in Volume: 100% 0 Epithelialization: Large (67-100%) 0 Tunneling: No 0 Undermining: No Wound Description Classification: Full Thickness Without Exposed Support Structures Exudate Amount: None Present Foul Odor After Cleansing: No Slough/Fibrino No Wound Bed Granulation Amount: None Present (0%) Exposed Structure Necrotic Amount: None Present (0%) Fascia Exposed: No Fat Layer (Subcutaneous Tissue) Exposed: No Tendon Exposed: No Muscle Exposed: No Joint Exposed: No Bone Exposed: No Electronic Signature(s) Signed: 10/27/2020 5:50:45 PM By: Carlene Coria  RN Signed: 10/27/2020 5:51:53 PM By: [name] RN, BSN Entered By: [name] on 10/27/2020 08:51:44 -------------------------------------------------------------------------------- [place] Details Patient Name: Date of Service: [name], [place] J. 10/27/2020 8:30 A M Medical Record Number: [id number] Patient Account Number: [id number] Date of Birth/Sex: Treating RN: 1933/02/19 (86 y.o. [name] Primary Care Jovane Foutz: Lajean Manes  T Other Clinician: Referring Nasiah Lehenbauer: Treating Bronislaw Switzer/Extender: Evelena Peat in Treatment: 91 Vital Signs Time Taken: 08:37 Temperature (F): 97.7 Height (in): 69 Pulse (bpm): 44 Weight (lbs): 163 Respiratory Rate (breaths/min): 18 Body Mass Index (BMI): 24.1 Blood Pressure (mmHg): 136/68 Reference Range: 80 - 120 mg / dl Electronic Signature(s) Signed: 10/27/2020 5:50:45 PM By: Carlene Coria RN Entered By: Carlene Coria on 10/27/2020 08:37:44

## 2020-10-28 NOTE — Progress Notes (Signed)
[name], [name] ([id number]) Visit Report for 10/27/2020 HPI Details Patient Name: Date of Service: [place], [name] 10/27/2020 8:30 A M Medical Record Number: [id number] Patient Account Number: [id number] Date of Birth/Sex: Treating RN: [birth year] (86 y.o. [name] Primary Care Provider: Patrina Levering Other Clinician: Referring Provider: Treating Provider/Extender: Evelena Peat in Treatment: 16 History of Present Illness HPI Description: ADMISSION 12/20/2019 This is an 86 year old woman referred by her primary physician Dr. Felipa Eth for review of a wound on her right lateral lower leg. She was actually in this clinic on 2 separate occasions in 2010 and 2012 cared for by Dr. Sherilyn Cooter. At that point in time she had wounds on her right leg as well. She tells [place] to 1 month ago she noticed a scab building up on her right lateral lower leg this opened into a wound. There was no overt cause of this no trauma, no infection that she is aware of. She has a history of chronic venous insufficiency and wears compression stockings fairly religiously indeed she is done well over the last 8 years since she was last in this clinic. She has been applying Vaseline on this and a covering phone. This is not progressing towards healing. Past medical history; interstitial lung disease, chronic atrial fibrillation status post pacemaker, osteoarthritis of the left knee, left breast CA, chronic repeat venous insufficiency. She takes Eliquis for her atrial fibrillation stroke prophylaxis. ABI in our clinic was 0.99 on the right 1/15; superficial wound on the right lateral calf in the setting of severe acute skin changes from chronic venous insufficiency and lymphedema. We used Iodoflex last week she complained of a lot of pain 1/22; this is a small but difficult wound on the right lateral calf in the setting of severe chronic venous insufficiency and secondary lymphedema.  She continues to state the wounds things hurts when she is up on it but seems to be relieved by putting her leg up. I changed her to [facility] last week because the Iodoflex seem to be causing stinging. 1/29. This wound appears to be contracting somewhat. Changes of chronic venous insufficiency with secondary lymphedema 2/12; not as good as surface today and slightly bigger. I had to change her from Iodoflex to [facility] because of the stinging pain although she does not think it was any better on the [facility]. She comes into clinic today with a area on the medial left great toe. She said she noticed blood on her sock last Saturday. She had some form of bunion surgery by Dr. Ila Mcgill her podiatrist sometime in 2019 she said he "shave the area". I could not find his operative report although I did see reference to the bunion in the area. 2/19; somewhat improved wound on the right lateral lower leg however the wound she came in on the bunion of her left great toe is actually I think larger still somewhat inflamed. We have been using Iodoflex 2/26; not much improvement in either wound area which is the original venous wound on the right lateral lower leg and in the area on the bunion of her left great toe. This still looks somewhat inflamed and tender. We have been using Iodoflex with open much improvement. 3/4; in general both her wounds look better this includes the original venous insufficiency wound on the right lateral lower leg and the area on her tip of her bunion on the left great toe medial aspect of the MTP. Change  to [facility] last week 3/12; the patient still has a small geographic shaped wound on the right lateral lower leg. We have been using Hydrofera Blue but I changed her to endoform today. She also has the area in the tip of the bunion of the left great toe. We will try Hydrofera Blue here as well 3/19; the small geographic wound on the right lateral lower leg  has not filled in. There is some surrounding induration we have noticed this previously. This may be venous inflammation but I wonder about biopsying this if this is not closing up. The area over the left medial first MTP [bunion deformity] requires debridement. This is not closing in. I am not convinced she is offloading adequately 3/26; right lateral lower leg in the setting of severe venous insufficiency and also an area over the first MTP bunion deformity. No debridement is required. We have been using endoform 4/9; right lateral lower leg wound in the setting of severe venous insufficiency and also a refractory area over the first MTP bunion deformity. The area on the right lateral lower leg is just about closed there is still a minor open area here. She comes in today with a mirror image area on the left lateral lower leg. She says this started as a scab 2 weeks ago and is gradually morphed into an open wound very similar to what she has on the right leg. She has severe bilateral venous insufficiency with venous hypertension obvious from clinical exam. 4/16; her original wound the right lateral leg wound in the setting of chronic venous hypertension is a small open area but very small. We have been using endoform. Her wound over the left first MTP bunion deformity also appears to be small and closing in. Unfortunately she has a large relative area on the left lateral calf which was new last week. Very adherent debris over this we have been using Iodoflex however that may be contributing to the debris on the wound surface 4/23; the original wound is closed and the area on the left first metatarsal bunion deformity is also almost closed the new area from last week required debridement. She went for her reflux studies that did not take her lower extremity wraps off. This is not helpful. She did have significant reflux in the right common femoral vein. I am not going to press this issue further.  She clinically has severe venous hypertension 4/29; the original wound is closed on the right lateral lower calf. The area on the left first metatarsal/bunion deformity has a small slitlike opening that is still not closed. The real problem here is now wound on the left lateral calf which is necrotic and deep. We used Iodoflex on this wound last time. Silver alginate on the left first toe The patient has [place] wraps. We should be able to transition the right leg into compression stockings and were doing this today. 5/7; the original wound remains closed on the right lateral calf. The left first metatarsal head still is open. Deterioration in the wound which is the new wound from several weeks ago on the left lateral calf. The patient is not aware how this could have happened. We have been using Sorbact starting last week under 3 layer compression 5/14; the right lateral calf is closed the area on the first met head on the left is closed However the new area from 3 weeks ago on the left lateral calf continues to expand. There is raised nodular tissue around  the wound necrotic debris on the surface. Circumference of the wound also looks necrotic. It looks as though there is involvement of the tissue under the skin around the large area of this wound which looks threatened. She is complaining of pain. Culture of this wound I did last week showed rare coag negative staph variant I have never heard of although I am doubtful this has clinical significance 05/09/20-Patient returns with a left lateral calf wound looking about the same, the biopsies reviewed show no atypical features and consistent with trauma or pressure injury or stasis change, patient has appointment to be scheduled with vascular We are using silver alginate and 3 layer compression Patient was started on Trental by dermatology which she has been taking for a week now 6/4; left lateral calf wound. I reviewed the dermatology notes with Dr.  Martin Majestic from [facility] dermatology. 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 Put her on pentoxifylline noteworthy that she is on Eliquis as well. We have been using silver alginate under compression. She has an appointment for repeat vascular studies on 7/6. This gets back to the fact that they did not take the wrap off on the original studies that were done. I do not believe she has an arterial issue. 6/10; left lateral calf wound. I put her on Hydrofera Blue last week. Some improvement in the surface condition of the wound. She has repeat vascular studies/reflux studies on 7/6. We are trying to get Apligraf through [place] [employer]. Her husband states he looked on the website this is not going to be approved. I am really not sure if this is a "class effect" 6/18; somewhat surprisingly Apligraf was approved and 100% covered. Her husband was also quite shocked by this. Nevertheless we have her repeat vascular studies on 7/6 and I will not be able to apply this until this is done. We are using Hydrofera Blue to the wound on the left. Her original wound on the right lower leg has maintain closure using compression stocking 6/25; repeat vascular studies/venous on 7/6; we will apply the Apligraf I think that week. We are also going to work around a week's vacation they have the first week in August. 7/2; reflux studies on 7/6;. Likely place first Apligraf from the major wound area on 7/9; 7/9; patient was kindly seen by Dr. Donnetta Hutching of vein and vascular to review her circulation status with the refractory wounds in the left leg. At first wounds in this clinic were on the right leg and they heal she now has a juxta lite stocking. Dr. Donnetta Hutching thought she had venous stasis disease with venous ulcerations. He did not feel that there was anything that would suggest to expect approval with a blush ablation of her  saphenous vein the saphenous veins were felt to be relatively small in caliber. She was not felt to have an arterial issue Apligraf #1 apply today in the standard fashion 7/23; the patient came in today complaining of a lot of pain in the left lower leg where the wound is. We applied Apligraf 2 weeks ago I also increased her compression from 3-4 layer wondering about the venous hypertension. She arrives today with her original wound that we have been treating with Apligraf looking quite healthy although there is some surface slough. Problematically she has de-epithelialized and developed erythema below the wound towards the lateral malleolus itself. This is somewhat angry. I think this is venous  stasis loss of epithelialization. I do not believe this is infection . Apligraf #2 applied to the original wound area 8/9-Patient returns at 2 weeks for Apligraf #3, the left leg venous leg ulcer area had more slough this time 8/23 Apligraf #4 her wound areas are small however she is still complaining of pain. She has previously been seen by vein and vascular. She has not felt to have an arterial issue she was felt to have venous stasis disease. She originally came here with a wound on her right leg on the right lateral calf this healed out. She then developed a difficult area on the left. She saw dermatology wondered whether this could be a vasculopathy. I am not sure if she is still on pentoxifylline the dermatology suggested I will have to clarify this with 9/7; patient complains of unrelenting pain making her life miserable. I been using Apligraf to close her major wound down and the major wound has come down to 2 open areas which are small and granulated. However there is erythema both inferiorly and superiorly cratered thick skin with wound issues between these areas extending into the lateral calf. There is erythema here and tenderness. I have been managing this largely as this this was severe stasis  dermatitis. Vein and vascular did not feel that she would benefit from an ablation. She went to see [facility] dermatology some months ago who wondered about a vasculopathy and suggested a biopsy but I am reluctant to go forward with that here. The patient is on Eliquis. 9/13; the patient saw Dr. Ubaldo Glassing on 9/8; she noted superficial cutaneous breakdown located on her left inferior lateral posterior tibia. She does not have a specific diagnosis for the patient's ulcer. Wanted to rule out squamous cell carcinoma and pyoderma gangrenosum she did a punch biopsy that is still bleeding. They came back for a dressing change after that procedure. They have still not heard the results of the biopsy. Was still using silver alginate as the primary dressing. She is tolerating the doxycycline although she had to take it with food 9/20; the biopsy that was done by dermatology/Dr. Ubaldo Glassing on 9/8 showed findings consistent with stasis dermatitis. I think we can put any thought of an alternative diagnosis to rest here. She is already seen vein and vascular today did not think she was a candidate for ablations. They felt she had adequate arterial flow. We have been using silver alginate and TCA I put her on doxycycline last time because of erythema around the wound which very well could be venous inflammation/stasis dermatitis but I had some concerns about cellulitis. Doxycycline being a good drug for this because of its anti-inflammatory as well as its antibiotic effects. We also received a note from the patient's cardiologist Dr. Darene Lamer aylor about interactions between doxycycline and propafenone. While I was aware of the potential interaction between propafenone and quinolones I was not aware of any interaction between doxycycline and propafenone. Nor can I find this interaction described. Nevertheless I will follow Dr. Tanna Furry expertise in this area. She was having nausea in any case and was about to stop it even without  cardiology advice. She was also in the ER because of bleeding from the biopsy site and in urgent care because of nausea 9/27 weekly follow-up. Area on the left lateral calf venous insufficiency wounds with at one point severe stasis dermatitis. She is actually doing quite nicely. She has 4 or 5 small open areas. Some of them with eschared surfaces which  I removed with a #5 curette. The skin around here looks excellent. Using silver alginate Liberal TCA under compression 10/4; left lateral calf venous insufficiency wounds. Using silver alginate 10/11; left lateral venous insufficiency wounds. She has 3 areas that we are watching now. These almost form a line. The most distal part is definitely still open. Superiorly the next area is eschared which does not look 100% closed where is the most proximal area appears closed. We are using silver alginate under compression. She has made a nice improvement. She tells me that she has bought a small exercise bike one of the portable ones that. She is using this and this actually may be helpful 10/18; left lateral venous insufficiency wounds. 1 small opening remains almost completely closed. We have been using silver alginate under compression 11/1; left lateral venous insufficiency wounds. I thought this would be closed. Using silver alginate. Arrives in clinic with dry skin and callus over the wound surface. I removed this with a #5 curette clean wound. Change the primary dressing to polymen 11/8; left lateral leg venous insufficiency wounds. Very tiny open area inferiorly is still not closed. She has been using polymen 11/15; left lateral leg venous insufficiency wounds this is all closed. I could identify no open area. Electronic Signature(s) Signed: 10/27/2020 5:39:18 PM By: Linton Ham MD Entered By: Linton Ham on 10/27/2020 09:02:27 -------------------------------------------------------------------------------- Physical Exam Details Patient  Name: Date of Service: [name] 10/27/2020 8:30 A M Medical Record Number: [id number] Patient Account Number: [id number] Date of Birth/Sex: Treating RN: Aug 14, 1933 (86 y.o. [name] Primary Care Provider: Patrina Levering Other Clinician: Referring Provider: Treating Provider/Extender: [name] in Treatment: 73 Constitutional Sitting or standing Blood Pressure is within target range for patient.. Pulse regular and within target range for patient.Marland Kitchen Respirations regular, non-labored and within target range.. Temperature is normal and within the target range for the patient.Marland Kitchen Appears in no distress. Cardiovascular Pedal pulses are palpable. Edema control is good. Notes Wound exam; left lateral ankle and calf the only open area from last week was eschared over. I gently removed this I did not see anything open therefore I think she is healed. Patient has severe venous hypertension. Started off with a wound on the right leg then graduated to a large difficult area on the left lateral leg which we have been working on for the last several months Engineer, maintenance) Signed: 10/27/2020 5:39:18 PM By: Linton Ham MD Entered By: Linton Ham on 10/27/2020 09:03:37 -------------------------------------------------------------------------------- Physician Orders Details Patient Name: Date of Service: [name] 10/27/2020 8:30 A M Medical Record Number: [id number] Patient Account Number: [id number] Date of Birth/Sex: Treating RN: [birth year] (86 y.o. [name] Primary Care Provider: Lajean Manes T Other Clinician: Referring Provider: Treating Provider/Extender: Evelena Peat in Treatment: 63 Verbal / Phone Orders: No Diagnosis Coding ICD-10 Coding Code Description I87.321 Chronic venous hypertension (idiopathic) with inflammation of right lower extremity L97.828 Non-pressure chronic ulcer of  other part of left lower leg with other specified severity I87.322 Chronic venous hypertension (idiopathic) with inflammation of left lower extremity L97.522 Non-pressure chronic ulcer of other part of left foot with fat layer exposed Discharge From [facility] Discharge from [facility]riers/Peri-Wound Care Moisturizing lotion - both legs daily Primary Wound Dressing Other: - Keep left leg padded for protection Edema Control Avoid standing for long periods of time Elevate legs to the level of the heart or above  for 30 minutes daily and/or when sitting, a frequency of: - throughout the day Exercise regularly Support Garment 20-30 mm/Hg pressure to: - apply farrow wrap 4000 to both legs daily. Apply first thing in the morning and remove at night before bed. Electronic Signature(s) Signed: 10/27/2020 5:39:18 PM By: Linton Ham MD Signed: 10/27/2020 5:51:53 PM By: [name] RN, BSN Entered By: [name] on 10/27/2020 08:53:54 -------------------------------------------------------------------------------- Problem List Details Patient Name: Date of Service: [place], [place]. 10/27/2020 8:30 A M Medical Record Number: [id number] Patient Account Number: [id number] Date of Birth/Sex: Treating RN: [birth year] (86 y.o. [name] Primary Care Provider: Lajean Manes T Other Clinician: Referring Provider: Treating Provider/Extender: Evelena Peat in Treatment: 71 Active Problems ICD-10 Encounter Code Description Active Date MDM Diagnosis I87.321 Chronic venous hypertension (idiopathic) with inflammation of right lower 12/20/2019 No Yes extremity L97.828 Non-pressure chronic ulcer of other part of left lower leg with other specified 03/21/2020 No Yes severity I87.322 Chronic venous hypertension (idiopathic) with inflammation of left lower 02/14/2020 No Yes extremity L97.522 Non-pressure chronic ulcer of other part of left foot  with fat layer exposed 01/25/2020 No Yes Inactive Problems ICD-10 Code Description Active Date Inactive Date L97.812 Non-pressure chronic ulcer of other part of right lower leg with fat layer exposed 12/20/2019 12/20/2019 Resolved Problems Electronic Signature(s) Signed: 10/27/2020 5:39:18 PM By: Linton Ham MD Entered By: Linton Ham on 10/27/2020 09:01:46 -------------------------------------------------------------------------------- Progress Note Details Patient Name: Date of Service: [name] 10/27/2020 8:30 A M Medical Record Number: [id number] Patient Account Number: [id number] Date of Birth/Sex: Treating RN: 09/28/1933 (86 y.o. [name] Primary Care Provider: Patrina Levering Other Clinician: Referring Provider: Treating Provider/Extender: Evelena Peat in Treatment: 73 Subjective History of Present Illness (HPI) ADMISSION 12/20/2019 This is an 86 year old woman referred by her primary physician Dr. Felipa Eth for review of a wound on her right lateral lower leg. She was actually in this clinic on 2 separate occasions in 2010 and 2012 cared for by Dr. Sherilyn Cooter. At that point in time she had wounds on her right leg as well. She tells [place] to 1 month ago she noticed a scab building up on her right lateral lower leg this opened into a wound. There was no overt cause of this no trauma, no infection that she is aware of. She has a history of chronic venous insufficiency and wears compression stockings fairly religiously indeed she is done well over the last 8 years since she was last in this clinic. She has been applying Vaseline on this and a covering phone. This is not progressing towards healing. Past medical history; interstitial lung disease, chronic atrial fibrillation status post pacemaker, osteoarthritis of the left knee, left breast CA, chronic repeat venous insufficiency. She takes Eliquis for her atrial fibrillation stroke  prophylaxis. ABI in our clinic was 0.99 on the right 1/15; superficial wound on the right lateral calf in the setting of severe acute skin changes from chronic venous insufficiency and lymphedema. We used Iodoflex last week she complained of a lot of pain 1/22; this is a small but difficult wound on the right lateral calf in the setting of severe chronic venous insufficiency and secondary lymphedema. She continues to state the wounds things hurts when she is up on it but seems to be relieved by putting her leg up. I changed her to [facility] last week because the Iodoflex seem to be causing stinging. 1/29. This wound appears to  be contracting somewhat. Changes of chronic venous insufficiency with secondary lymphedema 2/12; not as good as surface today and slightly bigger. I had to change her from Iodoflex to [facility] because of the stinging pain although she does not think it was any better on the [facility]. ooShe comes into clinic today with a area on the medial left great toe. She said she noticed blood on her sock last Saturday. She had some form of bunion surgery by Dr. Ila Mcgill her podiatrist sometime in 2019 she said he "shave the area". I could not find his operative report although I did see reference to the bunion in the area. 2/19; somewhat improved wound on the right lateral lower leg however the wound she came in on the bunion of her left great toe is actually I think larger still somewhat inflamed. We have been using Iodoflex 2/26; not much improvement in either wound area which is the original venous wound on the right lateral lower leg and in the area on the bunion of her left great toe. This still looks somewhat inflamed and tender. We have been using Iodoflex with open much improvement. 3/4; in general both her wounds look better this includes the original venous insufficiency wound on the right lateral lower leg and the area on her tip of her bunion on the left  great toe medial aspect of the MTP. Change to Hydrofera Blue last week 3/12; the patient still has a small geographic shaped wound on the right lateral lower leg. We have been using Hydrofera Blue but I changed her to endoform today. She also has the area in the tip of the bunion of the left great toe. We will try Hydrofera Blue here as well 3/19; the small geographic wound on the right lateral lower leg has not filled in. There is some surrounding induration we have noticed this previously. This may be venous inflammation but I wonder about biopsying this if this is not closing up. The area over the left medial first MTP [bunion deformity] requires debridement. This is not closing in. I am not convinced she is offloading adequately 3/26; right lateral lower leg in the setting of severe venous insufficiency and also an area over the first MTP bunion deformity. No debridement is required. We have been using endoform 4/9; right lateral lower leg wound in the setting of severe venous insufficiency and also a refractory area over the first MTP bunion deformity. The area on the right lateral lower leg is just about closed there is still a minor open area here. She comes in today with a mirror image area on the left lateral lower leg. She says this started as a scab 2 weeks ago and is gradually morphed into an open wound very similar to what she has on the right leg. She has severe bilateral venous insufficiency with venous hypertension obvious from clinical exam. 4/16; her original wound the right lateral leg wound in the setting of chronic venous hypertension is a small open area but very small. We have been using endoform. Her wound over the left first MTP bunion deformity also appears to be small and closing in. Unfortunately she has a large relative area on the left lateral calf which was new last week. Very adherent debris over this we have been using Iodoflex however that may be contributing to the  debris on the wound surface 4/23; the original wound is closed and the area on the left first metatarsal bunion deformity is also  almost closed the new area from last week required debridement. She went for her reflux studies that did not take her lower extremity wraps off. This is not helpful. She did have significant reflux in the right common femoral vein. I am not going to press this issue further. She clinically has severe venous hypertension 4/29; the original wound is closed on the right lateral lower calf. The area on the left first metatarsal/bunion deformity has a small slitlike opening that is still not closed. The real problem here is now wound on the left lateral calf which is necrotic and deep. We used Iodoflex on this wound last time. Silver alginate on the left first toe The patient has [place] wraps. We should be able to transition the right leg into compression stockings and were doing this today. 5/7; the original wound remains closed on the right lateral calf. The left first metatarsal head still is open. Deterioration in the wound which is the new wound from several weeks ago on the left lateral calf. The patient is not aware how this could have happened. We have been using Sorbact starting last week under 3 layer compression 5/14; the right lateral calf is closed the area on the first met head on the left is closed However the new area from 3 weeks ago on the left lateral calf continues to expand. There is raised nodular tissue around the wound necrotic debris on the surface. Circumference of the wound also looks necrotic. It looks as though there is involvement of the tissue under the skin around the large area of this wound which looks threatened. She is complaining of pain. Culture of this wound I did last week showed rare coag negative staph variant I have never heard of although I am doubtful this has clinical significance 05/09/20-Patient returns with a left lateral calf  wound looking about the same, the biopsies reviewed show no atypical features and consistent with trauma or pressure injury or stasis change, patient has appointment to be scheduled with vascular We are using silver alginate and 3 layer compression Patient was started on Trental by dermatology which she has been taking for a week now 6/4; left lateral calf wound. I reviewed the dermatology notes with Dr. Martin Majestic from [facility] dermatology. 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 Put her on pentoxifylline noteworthy that she is on Eliquis as well. We have been using silver alginate under compression. She has an appointment for repeat vascular studies on 7/6. This gets back to the fact that they did not take the wrap off on the original studies that were done. I do not believe she has an arterial issue. 6/10; left lateral calf wound. I put her on Hydrofera Blue last week. Some improvement in the surface condition of the wound. She has repeat vascular studies/reflux studies on 7/6. We are trying to get Apligraf through [place] healthcare. Her husband states he looked on the website this is not going to be approved. I am really not sure if this is a "class effect" 6/18; somewhat surprisingly Apligraf was approved and 100% covered. Her husband was also quite shocked by this. Nevertheless we have her repeat vascular studies on 7/6 and I will not be able to apply this until this is done. We are using Hydrofera Blue to the wound on the left. Her original wound on the right lower leg has maintain closure using compression stocking 6/25; repeat vascular studies/venous  on 7/6; we will apply the Apligraf I think that week. We are also going to work around a week's vacation they have the first week in August. 7/2; reflux studies on 7/6;. Likely place first Apligraf from the major wound area on 7/9; 7/9; patient was  kindly seen by Dr. Donnetta Hutching of vein and vascular to review her circulation status with the refractory wounds in the left leg. At first wounds in this clinic were on the right leg and they heal she now has a juxta lite stocking. Dr. Donnetta Hutching thought she had venous stasis disease with venous ulcerations. He did not feel that there was anything that would suggest to expect approval with a blush ablation of her saphenous vein the saphenous veins were felt to be relatively small in caliber. She was not felt to have an arterial issue Apligraf #1 apply today in the standard fashion 7/23; the patient came in today complaining of a lot of pain in the left lower leg where the wound is. We applied Apligraf 2 weeks ago I also increased her compression from 3-4 layer wondering about the venous hypertension. She arrives today with her original wound that we have been treating with Apligraf looking quite healthy although there is some surface slough. Problematically she has de-epithelialized and developed erythema below the wound towards the lateral malleolus itself. This is somewhat angry. I think this is venous stasis loss of epithelialization. I do not believe this is infection . Apligraf #2 applied to the original wound area 8/9-Patient returns at 2 weeks for Apligraf #3, the left leg venous leg ulcer area had more slough this time 8/23 Apligraf #4 her wound areas are small however she is still complaining of pain. She has previously been seen by vein and vascular. She has not felt to have an arterial issue she was felt to have venous stasis disease. She originally came here with a wound on her right leg on the right lateral calf this healed out. She then developed a difficult area on the left. She saw dermatology wondered whether this could be a vasculopathy. I am not sure if she is still on pentoxifylline the dermatology suggested I will have to clarify this with 9/7; patient complains of unrelenting pain making her  life miserable. I been using Apligraf to close her major wound down and the major wound has come down to 2 open areas which are small and granulated. However there is erythema both inferiorly and superiorly cratered thick skin with wound issues between these areas extending into the lateral calf. There is erythema here and tenderness. I have been managing this largely as this this was severe stasis dermatitis. Vein and vascular did not feel that she would benefit from an ablation. She went to [facility] dermatology some months ago who wondered about a vasculopathy and suggested a biopsy but I am reluctant to go forward with that here. The patient is on Eliquis. 9/13; the patient saw Dr. Ubaldo Glassing on 9/8; she noted superficial cutaneous breakdown located on her left inferior lateral posterior tibia. She does not have a specific diagnosis for the patient's ulcer. Wanted to rule out squamous cell carcinoma and pyoderma gangrenosum she did a punch biopsy that is still bleeding. They came back for a dressing change after that procedure. They have still not heard the results of the biopsy. Was still using silver alginate as the primary dressing. She is tolerating the doxycycline although she had to take it with food 9/20; the biopsy that was  done by dermatology/Dr. Ubaldo Glassing on 9/8 showed findings consistent with stasis dermatitis. I think we can put any thought of an alternative diagnosis to rest here. She is already seen vein and vascular today did not think she was a candidate for ablations. They felt she had adequate arterial flow. We have been using silver alginate and TCA I put her on doxycycline last time because of erythema around the wound which very well could be venous inflammation/stasis dermatitis but I had some concerns about cellulitis. Doxycycline being a good drug for this because of its anti-inflammatory as well as its antibiotic effects. We also received a note from the patient's cardiologist  Dr. Darene Lamer aylor about interactions between doxycycline and propafenone. While I was aware of the potential interaction between propafenone and quinolones I was not aware of any interaction between doxycycline and propafenone. Nor can I find this interaction described. Nevertheless I will follow Dr. Tanna Furry expertise in this area. She was having nausea in any case and was about to stop it even without cardiology advice. She was also in the ER because of bleeding from the biopsy site and in urgent care because of nausea 9/27 weekly follow-up. Area on the left lateral calf venous insufficiency wounds with at one point severe stasis dermatitis. She is actually doing quite nicely. She has 4 or 5 small open areas. Some of them with eschared surfaces which I removed with a #5 curette. The skin around here looks excellent. Using silver alginate Liberal TCA under compression 10/4; left lateral calf venous insufficiency wounds. Using silver alginate 10/11; left lateral venous insufficiency wounds. She has 3 areas that we are watching now. These almost form a line. The most distal part is definitely still open. Superiorly the next area is eschared which does not look 100% closed where is the most proximal area appears closed. We are using silver alginate under compression. She has made a nice improvement. She tells me that she has bought a small exercise bike one of the portable ones that. She is using this and this actually may be helpful 10/18; left lateral venous insufficiency wounds. 1 small opening remains almost completely closed. We have been using silver alginate under compression 11/1; left lateral venous insufficiency wounds. I thought this would be closed. Using silver alginate. Arrives in clinic with dry skin and callus over the wound surface. I removed this with a #5 curette clean wound. Change the primary dressing to polymen 11/8; left lateral leg venous insufficiency wounds. Very tiny open area  inferiorly is still not closed. She has been using polymen 11/15; left lateral leg venous insufficiency wounds this is all closed. I could identify no open area. Objective Constitutional Sitting or standing Blood Pressure is within target range for patient.. Pulse regular and within target range for patient.Marland Kitchen Respirations regular, non-labored and within target range.. Temperature is normal and within the target range for the patient.Marland Kitchen Appears in no distress. Vitals Time Taken: 8:37 AM, Height: 69 in, Weight: 163 lbs, BMI: 24.1, Temperature: 97.7 F, Pulse: 44 bpm, Respiratory Rate: 18 breaths/min, Blood Pressure: 136/68 mmHg. Cardiovascular Pedal pulses are palpable. Edema control is good. General Notes: Wound exam; left lateral ankle and calf the only open area from last week was eschared over. I gently removed this I did not see anything open therefore I think she is healed. Patient has severe venous hypertension. Started off with a wound on the right leg then graduated to a large difficult area on the left lateral leg which we  have been working on for the last several months Integumentary (Hair, Skin) Wound #5 status is Healed - Epithelialized. Original cause of wound was Gradually Appeared. The wound is located on the Left,Lateral Lower Leg. The wound measures 0cm length x 0cm width x 0cm depth; 0cm^2 area and 0cm^3 volume. There is no tunneling or undermining noted. There is a none present amount of drainage noted. There is no granulation within the wound bed. There is no necrotic tissue within the wound bed. Assessment Active Problems ICD-10 Chronic venous hypertension (idiopathic) with inflammation of right lower extremity Non-pressure chronic ulcer of other part of left lower leg with other specified severity Chronic venous hypertension (idiopathic) with inflammation of left lower extremity Non-pressure chronic ulcer of other part of left foot with fat layer exposed Plan Discharge  From [facility] Services: Discharge from [place] Skin Barriers/Peri-Wound Care: Moisturizing lotion - both legs daily Primary Wound Dressing: Other: - Keep left leg padded for protection Edema Control: Avoid standing for long periods of time Elevate legs to the level of the heart or above for 30 minutes daily and/or when sitting, a frequency of: - throughout the day Exercise regularly Support Garment 20-30 mm/Hg pressure to: - apply farrow wrap 4000 to both legs daily. Apply first thing in the morning and remove at night before bed. 1. The patient is healed 2. Severe venous hypertension 3 we went over her stockings, lubrication, leg elevation, exercise 4. This patient has severe venous hypertension. She has seen vein and vascular. 5. I think the likelihood of further breakdown in this patient's bilateral lower extremities is high. Electronic Signature(s) Signed: 10/27/2020 5:39:18 PM By: Linton Ham MD Entered By: Linton Ham on 10/27/2020 09:05:36 -------------------------------------------------------------------------------- SuperBill Details Patient Name: Date of Service: [name] 10/27/2020 Medical Record Number: [id number] Patient Account Number: [id number] Date of Birth/Sex: Treating RN: 1933/04/21 (86 y.o. [name] Primary Care Provider: Lajean Manes T Other Clinician: Referring Provider: Treating Provider/Extender: Evelena Peat in Treatment: 44 Diagnosis Coding ICD-10 Codes Code Description [id number] Chronic venous hypertension (idiopathic) with inflammation of right lower extremity L97.828 Non-pressure chronic ulcer of other part of left lower leg with other specified severity I87.322 Chronic venous hypertension (idiopathic) with inflammation of left lower extremity L97.522 Non-pressure chronic ulcer of other part of left foot with fat layer exposed Facility Procedures The patient participates with Medicare or their  insurance follows the Medicare Facility Guidelines: CPT4 Code Description Modifier Quantity [id number] [phone] - WOUND CARE VISIT-LEV 3 EST PT 1 Physician Procedures : CPT4 Code Description Modifier [id number] - WC PHYS LEVEL 3 - EST PT ICD-10 Diagnosis Description I87.321 Chronic venous hypertension (idiopathic) with inflammation of right lower extremity L97.828 Non-pressure chronic ulcer of other part of left  lower leg with other specified severity I87.322 Chronic venous hypertension (idiopathic) with inflammation of left lower extremity L97.522 Non-pressure chronic ulcer of other part of left foot with fat layer exposed Quantity: 1 Electronic Signature(s) Signed: 10/27/2020 5:39:18 PM By: Linton Ham MD Entered By: Linton Ham on 10/27/2020 09:05:54

## 2020-11-25 ENCOUNTER — Ambulatory Visit (INDEPENDENT_AMBULATORY_CARE_PROVIDER_SITE_OTHER): Payer: Medicare Other

## 2020-11-25 DIAGNOSIS — I4819 Other persistent atrial fibrillation: Secondary | ICD-10-CM

## 2020-11-25 LAB — CUP PACEART REMOTE DEVICE CHECK
Battery Remaining Longevity: 98 mo
Battery Voltage: 2.99 V
Brady Statistic AP VP Percent: 0 %
Brady Statistic AP VS Percent: 0 %
Brady Statistic AS VP Percent: 77.81 %
Brady Statistic AS VS Percent: 22.19 %
Brady Statistic RA Percent Paced: 0 %
Brady Statistic RV Percent Paced: 77.81 %
Date Time Interrogation Session: 20211214003629
Implantable Lead Implant Date: 20180905
Implantable Lead Implant Date: 20180905
Implantable Lead Location: 753859
Implantable Lead Location: 753860
Implantable Lead Model: 3830
Implantable Lead Model: 5076
Implantable Pulse Generator Implant Date: 20180905
Lead Channel Impedance Value: 266 Ohm
Lead Channel Impedance Value: 361 Ohm
Lead Channel Impedance Value: 380 Ohm
Lead Channel Impedance Value: 570 Ohm
Lead Channel Pacing Threshold Amplitude: 0.5 V
Lead Channel Pacing Threshold Pulse Width: 0.4 ms
Lead Channel Sensing Intrinsic Amplitude: 1.125 mV
Lead Channel Sensing Intrinsic Amplitude: 1.125 mV
Lead Channel Sensing Intrinsic Amplitude: 2.875 mV
Lead Channel Sensing Intrinsic Amplitude: 3.125 mV
Lead Channel Setting Pacing Amplitude: 2.5 V
Lead Channel Setting Pacing Pulse Width: 0.4 ms
Lead Channel Setting Sensing Sensitivity: 0.9 mV

## 2020-11-28 ENCOUNTER — Other Ambulatory Visit: Payer: Self-pay

## 2020-11-28 ENCOUNTER — Ambulatory Visit (INDEPENDENT_AMBULATORY_CARE_PROVIDER_SITE_OTHER): Payer: Medicare Other | Admitting: Internal Medicine

## 2020-11-28 ENCOUNTER — Encounter: Payer: Self-pay | Admitting: Internal Medicine

## 2020-11-28 VITALS — BP 110/74 | HR 72 | Ht 68.0 in | Wt 155.0 lb

## 2020-11-28 DIAGNOSIS — I4892 Unspecified atrial flutter: Secondary | ICD-10-CM

## 2020-11-28 DIAGNOSIS — Z950 Presence of cardiac pacemaker: Secondary | ICD-10-CM | POA: Diagnosis not present

## 2020-11-28 DIAGNOSIS — I493 Ventricular premature depolarization: Secondary | ICD-10-CM | POA: Diagnosis not present

## 2020-11-28 DIAGNOSIS — I5032 Chronic diastolic (congestive) heart failure: Secondary | ICD-10-CM | POA: Diagnosis not present

## 2020-11-28 DIAGNOSIS — I482 Chronic atrial fibrillation, unspecified: Secondary | ICD-10-CM

## 2020-11-28 MED ORDER — LOSARTAN POTASSIUM 50 MG PO TABS: 50.0000 mg | ORAL_TABLET | Freq: Every day | ORAL | 3 refills | Status: DC

## 2020-11-28 NOTE — Patient Instructions (Addendum)
Medication Instructions:  Your physician has recommended you make the following change in your medication:   1.  REDUCE your losartan to 50 mg- Take one tablet by mouth daily.  (You may cut your current prescription for 100 mg losartan in half until they are gone if you would like)  Labwork: None ordered.  Testing/Procedures: None ordered.  Follow-Up: Your physician wants you to follow-up in: April 2022 with Dr. Lovena Le.    March 27, 2021 at 2:15 pm at the [facility] office  Remote monitoring is used to monitor your Pacemaker from home. This monitoring reduces the number of office visits required to check your device to one time per year. It allows [place] to keep an eye on the functioning of your device to ensure it is working properly. You are scheduled for a device check from home on 02/24/2021. You may send your transmission at any time that day. If you have a wireless device, the transmission will be sent automatically. After your physician reviews your transmission, you will receive a postcard with your next transmission date.    Any Other Special Instructions Will Be Listed Below (If Applicable).  If you need a refill on your cardiac medications before your next appointment, please call your pharmacy.

## 2020-11-28 NOTE — Progress Notes (Signed)
HPI Mrs. Neeb returns today for followup of symptomatic PVC's. She has a h/o atrial flutter s/p ablation, uncontrolled atrial fib s/p AV node ablation and PPM, and symptomatic PVC's for which she could not tolerate flecainide or multaq. I tried her on amiodarone 6 weeks ago. In the interim, she notes some improvement and she has not been nauseated on amiodarone.  Allergies  Allergen Reactions  . Adhesive [Tape] Rash    Including EKG electrodes and the like.  . Codeine Other (See Comments)    unknown  . Latex Itching  . Percodan [Oxycodone-Aspirin] Nausea And Vomiting    Nausea      Current Outpatient Medications  Medication Sig Dispense Refill  . amiodarone (PACERONE) 200 MG tablet Take 1 tablet (200 mg total) by mouth daily. 30 tablet 11  . antiseptic oral rinse (BIOTENE) LIQD 15 mLs by Mouth Rinse route as needed for dry mouth.    . Carboxymethylcellul-Glycerin (LUBRICATING EYE DROPS OP) Apply 1 drop to eye daily as needed (dry eyes).    . Cyanocobalamin (B-12) 1000 MCG TABS Take 1 tablet by mouth daily.    Marland Kitchen ELIQUIS 5 MG TABS tablet TAKE 1 TABLET BY MOUTH  TWICE DAILY 180 tablet 1  . hydrochlorothiazide (HYDRODIURIL) 25 MG tablet Take 1 tablet (25 mg total) by mouth daily. 90 tablet 3  . HYDROcodone-acetaminophen (NORCO) 10-325 MG tablet Take 1 tablet by mouth 3 (three) times daily as needed for pain.    Marland Kitchen levothyroxine (SYNTHROID) 100 MCG tablet Take 100 mcg by mouth daily before breakfast.    . losartan (COZAAR) 100 MG tablet Take 100 mg by mouth daily.    . metoprolol succinate (TOPROL XL) 25 MG 24 hr tablet Take 1 tablet (25 mg total) by mouth daily. 30 tablet 11  . MYRBETRIQ 50 MG TB24 tablet Take 50 mg by mouth daily.    . raNITIdine HCl (WAL-ZAN 75 PO) Take 1 tablet by mouth daily as needed.     . traMADol (ULTRAM) 50 MG tablet Take 50 mg by mouth 3 (three) times daily as needed.    . triamcinolone cream (KENALOG) 0.1 % Apply 1 application topically daily as needed.      . Vitamin D, Ergocalciferol, (DRISDOL) 50000 units CAPS capsule Take 50,000 Units by mouth every 7 (seven) days.     No current facility-administered medications for this visit.     Past Medical History:  Diagnosis Date  . Allergic rhinitis   . Anemia   . Breast cancer, left breast ([place]) 01/15/15   Invasive Mammary  . Chronic venous insufficiency   . Hematuria    negative workup - Dr. Reece Agar  . Hypertension   . Migraine headache   . MVP (mitral valve prolapse)   . PAF (paroxysmal atrial fibrillation) ([place])   . SVT (supraventricular tachycardia) ([place]) 06/2010   s/p AVNRT ablation  . Wears glasses     ROS:   All systems reviewed and negative except as noted in the HPI.   Past Surgical History:  Procedure Laterality Date  . A-FLUTTER ABLATION N/A 02/04/2017   Procedure: A-Flutter Ablation;  Surgeon: Evans Lance, MD;  Location: [place] [facility];  Service: Cardiovascular;  Laterality: N/A;  . ABDOMINAL HYSTERECTOMY    . AV NODE ABLATION N/A 08/17/2017   Procedure: AV Node Ablation;  Surgeon: Evans Lance, MD;  Location: [place] [facility];  Service: Cardiovascular;  Laterality: N/A;  . BREAST LUMPECTOMY WITH RADIOACTIVE SEED LOCALIZATION  Left 02/14/2015   Procedure: BREAST LUMPECTOMY WITH RADIOACTIVE SEED LOCALIZATION;  Surgeon: Alphonsa Overall, MD;  Location: [place];  Service: General;  Laterality: Left;  . BREAST SURGERY  1990   rt br bx  . CARDIAC CATHETERIZATION  2011   ablasion  . CARDIOVERSION N/A 08/10/2017   Procedure: CARDIOVERSION;  Surgeon: Jerline Pain, MD;  Location: [facility] ENDOSCOPY;  Service: Cardiovascular;  Laterality: N/A;  . COLONOSCOPY    . DILATION AND CURETTAGE OF UTERUS    . Left Breast Biopsy  01/15/15  . PACEMAKER IMPLANT N/A 08/17/2017   Procedure: Pacemaker Implant;  Surgeon: Evans Lance, MD;  Location: [place] [facility];  Service: Cardiovascular;  Laterality: N/A;  . TUBAL LIGATION       Family History  Problem  Relation Age of Onset  . Hypertension Mother   . Heart disease Mother   . Arthritis Mother   . Other Father        hardening of the arteries  . Heart Problems Sister        pacemaker  . Heart attack Brother   . Throat cancer Paternal Uncle   . Breast cancer Sister        early 72s  . Alzheimer's disease Sister   . Pulmonary disease Sister   . Breast cancer Daughter 82       Negative genetic testing   . Breast cancer Paternal Aunt        dx over 59  . Cancer Paternal Aunt        cancer in her leg  . Breast cancer Cousin        multiple paternal cousin     Social History   Socioeconomic History  . Marital status: Married    Spouse name: Not on file  . Number of children: 3  . Years of education: Not on file  . Highest education level: Not on file  Occupational History  . Not on file  Tobacco Use  . Smoking status: Never Smoker  . Smokeless tobacco: Never Used  . Tobacco comment: did smoke about 2cigs long time about 54 plus years ago  Vaping Use  . Vaping Use: Never used  Substance and Sexual Activity  . Alcohol use: Yes    Alcohol/week: 0.0 standard drinks    Comment: once a week  . Drug use: No  . Sexual activity: Not on file  Other Topics Concern  . Not on file  Social History Narrative   [place] with husband.    Retired.    Social Determinants of Health   Financial Resource Strain: Not on file  Food Insecurity: Not on file  Transportation Needs: Not on file  Physical Activity: Not on file  Stress: Not on file  Social Connections: Not on file  Intimate Partner Violence: Not on file     BP 110/74   Pulse 72   Ht 5\' 8"  (1.727 m)   Wt 155 lb (70.3 kg)   LMP  (LMP Unknown)   SpO2 99%   BMI 23.57 kg/m   Physical Exam:  Well appearing NAD HEENT: Unremarkable Neck:  No JVD, no thyromegally Lymphatics:  No adenopathy Back:  No CVA tenderness Lungs:  Clear with no wheezes HEART:  Regular rate rhythm, no murmurs, no rubs, no clicks Abd:  soft,  positive bowel sounds, no organomegally, no rebound, no guarding Ext:  2 plus pulses, no edema, no cyanosis, no clubbing Skin:  No rashes no nodules  Neuro:  CN II through XII intact, motor grossly intact  EKG - atrial fib with ventricular pacing  DEVICE  Normal device function.  See PaceArt for details.   Assess/Plan: 1. PVC's - PM review demonstrates less than 2% PVC's. Continue amiodarone. I plan to reduce her dose when I see her in several months. 2. Atrial fib - her VR is well controlled s/p PPM insertion. 3. PPM - her medtronic PPM is programmed VVIR 70. We will recheck in several months. 4. Atrial flutter - she has not had any s/p ablation.  Carleene Overlie Andrewjames Weirauch,MD

## 2020-12-01 LAB — CUP PACEART INCLINIC DEVICE CHECK
Battery Remaining Longevity: 98 mo
Battery Voltage: 2.99 V
Brady Statistic AP VP Percent: 0 %
Brady Statistic AP VS Percent: 0 %
Brady Statistic AS VP Percent: 78.17 %
Brady Statistic AS VS Percent: 21.83 %
Brady Statistic RA Percent Paced: 0 %
Brady Statistic RV Percent Paced: 78.17 %
Date Time Interrogation Session: 20211217165254
Implantable Lead Implant Date: 20180905
Implantable Lead Implant Date: 20180905
Implantable Lead Location: 753859
Implantable Lead Location: 753860
Implantable Lead Model: 3830
Implantable Lead Model: 5076
Implantable Pulse Generator Implant Date: 20180905
Lead Channel Impedance Value: 285 Ohm
Lead Channel Impedance Value: 342 Ohm
Lead Channel Impedance Value: 399 Ohm
Lead Channel Impedance Value: 551 Ohm
Lead Channel Pacing Threshold Amplitude: 0.5 V
Lead Channel Pacing Threshold Pulse Width: 0.4 ms
Lead Channel Sensing Intrinsic Amplitude: 2.875 mV
Lead Channel Setting Pacing Amplitude: 2.5 V
Lead Channel Setting Pacing Pulse Width: 0.4 ms
Lead Channel Setting Sensing Sensitivity: 0.9 mV

## 2020-12-10 NOTE — Progress Notes (Signed)
Remote pacemaker transmission.   

## 2020-12-19 DIAGNOSIS — I5030 Unspecified diastolic (congestive) heart failure: Secondary | ICD-10-CM | POA: Diagnosis not present

## 2020-12-19 DIAGNOSIS — I10 Essential (primary) hypertension: Secondary | ICD-10-CM | POA: Diagnosis not present

## 2020-12-19 DIAGNOSIS — I4891 Unspecified atrial fibrillation: Secondary | ICD-10-CM | POA: Diagnosis not present

## 2020-12-19 DIAGNOSIS — I872 Venous insufficiency (chronic) (peripheral): Secondary | ICD-10-CM | POA: Diagnosis not present

## 2020-12-19 DIAGNOSIS — Z950 Presence of cardiac pacemaker: Secondary | ICD-10-CM | POA: Diagnosis not present

## 2020-12-19 DIAGNOSIS — Z23 Encounter for immunization: Secondary | ICD-10-CM | POA: Diagnosis not present

## 2020-12-19 DIAGNOSIS — D6869 Other thrombophilia: Secondary | ICD-10-CM | POA: Diagnosis not present

## 2020-12-30 ENCOUNTER — Encounter: Payer: Medicare Other | Admitting: Internal Medicine

## 2020-12-30 DIAGNOSIS — L570 Actinic keratosis: Secondary | ICD-10-CM | POA: Diagnosis not present

## 2020-12-30 DIAGNOSIS — L820 Inflamed seborrheic keratosis: Secondary | ICD-10-CM | POA: Diagnosis not present

## 2021-02-10 DIAGNOSIS — M1712 Unilateral primary osteoarthritis, left knee: Secondary | ICD-10-CM | POA: Diagnosis not present

## 2021-02-24 ENCOUNTER — Ambulatory Visit (INDEPENDENT_AMBULATORY_CARE_PROVIDER_SITE_OTHER): Payer: Medicare Other

## 2021-02-24 DIAGNOSIS — I480 Paroxysmal atrial fibrillation: Secondary | ICD-10-CM | POA: Diagnosis not present

## 2021-02-24 LAB — CUP PACEART REMOTE DEVICE CHECK
Battery Remaining Longevity: 94 mo
Battery Voltage: 2.98 V
Brady Statistic AP VP Percent: 0 %
Brady Statistic AP VS Percent: 0 %
Brady Statistic AS VP Percent: 97.05 %
Brady Statistic AS VS Percent: 2.95 %
Brady Statistic RA Percent Paced: 0 %
Brady Statistic RV Percent Paced: 97.05 %
Date Time Interrogation Session: 20220315012826
Implantable Lead Implant Date: 20180905
Implantable Lead Implant Date: 20180905
Implantable Lead Location: 753859
Implantable Lead Location: 753860
Implantable Lead Model: 3830
Implantable Lead Model: 5076
Implantable Pulse Generator Implant Date: 20180905
Lead Channel Impedance Value: 285 Ohm
Lead Channel Impedance Value: 342 Ohm
Lead Channel Impedance Value: 399 Ohm
Lead Channel Impedance Value: 551 Ohm
Lead Channel Pacing Threshold Amplitude: 1.375 V
Lead Channel Pacing Threshold Pulse Width: 0.4 ms
Lead Channel Sensing Intrinsic Amplitude: 1.625 mV
Lead Channel Sensing Intrinsic Amplitude: 1.625 mV
Lead Channel Sensing Intrinsic Amplitude: 2.875 mV
Lead Channel Sensing Intrinsic Amplitude: 3.125 mV
Lead Channel Setting Pacing Amplitude: 2.5 V
Lead Channel Setting Pacing Pulse Width: 0.4 ms
Lead Channel Setting Sensing Sensitivity: 0.9 mV

## 2021-03-05 NOTE — Progress Notes (Signed)
Remote pacemaker transmission.   

## 2021-03-06 ENCOUNTER — Other Ambulatory Visit: Payer: Self-pay | Admitting: Internal Medicine

## 2021-03-16 ENCOUNTER — Other Ambulatory Visit: Payer: Self-pay | Admitting: Internal Medicine

## 2021-03-16 ENCOUNTER — Other Ambulatory Visit: Payer: Self-pay

## 2021-03-16 DIAGNOSIS — Z7901 Long term (current) use of anticoagulants: Secondary | ICD-10-CM

## 2021-03-16 DIAGNOSIS — I484 Atypical atrial flutter: Secondary | ICD-10-CM

## 2021-03-16 DIAGNOSIS — I471 Supraventricular tachycardia: Secondary | ICD-10-CM

## 2021-03-16 DIAGNOSIS — I480 Paroxysmal atrial fibrillation: Secondary | ICD-10-CM

## 2021-03-16 NOTE — Telephone Encounter (Signed)
Pt's age 87, wt 70.3 kg, pt is overdue for labs.  Last appt w/ GT 11/26/20- has next appt sched 03/27/21 - will place order to have labs drawn at that visit.

## 2021-03-24 DIAGNOSIS — Z1231 Encounter for screening mammogram for malignant neoplasm of breast: Secondary | ICD-10-CM | POA: Diagnosis not present

## 2021-03-27 ENCOUNTER — Other Ambulatory Visit: Payer: Self-pay

## 2021-03-27 ENCOUNTER — Ambulatory Visit: Payer: Medicare Other | Admitting: Internal Medicine

## 2021-03-27 ENCOUNTER — Encounter: Payer: Self-pay | Admitting: Internal Medicine

## 2021-03-27 VITALS — BP 102/68 | HR 76 | Ht 69.0 in | Wt 158.0 lb

## 2021-03-27 DIAGNOSIS — Z79899 Other long term (current) drug therapy: Secondary | ICD-10-CM | POA: Diagnosis not present

## 2021-03-27 DIAGNOSIS — Z950 Presence of cardiac pacemaker: Secondary | ICD-10-CM | POA: Diagnosis not present

## 2021-03-27 DIAGNOSIS — I493 Ventricular premature depolarization: Secondary | ICD-10-CM | POA: Diagnosis not present

## 2021-03-27 DIAGNOSIS — I482 Chronic atrial fibrillation, unspecified: Secondary | ICD-10-CM

## 2021-03-27 DIAGNOSIS — I5032 Chronic diastolic (congestive) heart failure: Secondary | ICD-10-CM

## 2021-03-27 NOTE — Progress Notes (Signed)
HPI [name] returns today for followup of her PVC's. She has a h/o atrial flutter, s/p ablation. She has a h/o uncontrolled atrial fib ,s/p AV node ablation and PPM. She developed worsening PVC's and could not tolerate flecainide or multaq and she was placed on amiodarone. She has done well and is much improved with no symptomatic PVC's. She has not been nauseated. Allergies  Allergen Reactions  . Adhesive [Tape] Rash    Including EKG electrodes and the like.  . Codeine Other (See Comments)    unknown  . Latex Itching  . Percodan [Oxycodone-Aspirin] Nausea And Vomiting    Nausea      Current Outpatient Medications  Medication Sig Dispense Refill  . amiodarone (PACERONE) 200 MG tablet Take 1 tablet (200 mg total) by mouth daily. 30 tablet 11  . antiseptic oral rinse (BIOTENE) LIQD 15 mLs by Mouth Rinse route as needed for dry mouth.    Marland Kitchen apixaban (ELIQUIS) 5 MG TABS tablet Take 1 tablet (5 mg total) by mouth 2 (two) times daily. Needs labs for further refills. 60 tablet 0  . Carboxymethylcellul-Glycerin (LUBRICATING EYE DROPS OP) Apply 1 drop to eye daily as needed (dry eyes).    . Cyanocobalamin (B-12) 1000 MCG TABS Take 1 tablet by mouth daily.    . hydrochlorothiazide (HYDRODIURIL) 25 MG tablet TAKE 1 TABLET ONCE DAILY. 90 tablet 2  . HYDROcodone-acetaminophen (NORCO) 10-325 MG tablet Take 1 tablet by mouth 3 (three) times daily as needed for pain.    Marland Kitchen levothyroxine (SYNTHROID) 100 MCG tablet Take 100 mcg by mouth daily before breakfast.    . losartan (COZAAR) 50 MG tablet Take 1 tablet (50 mg total) by mouth daily. 90 tablet 3  . metoprolol succinate (TOPROL XL) 25 MG 24 hr tablet Take 1 tablet (25 mg total) by mouth daily. 30 tablet 11  . MYRBETRIQ 50 MG TB24 tablet Take 50 mg by mouth daily.    . raNITIdine HCl (WAL-ZAN 75 PO) Take 1 tablet by mouth daily as needed.     . traMADol (ULTRAM) 50 MG tablet Take 50 mg by mouth 3 (three) times daily as needed.    .  triamcinolone cream (KENALOG) 0.1 % Apply 1 application topically daily as needed.     . Vitamin D, Ergocalciferol, (DRISDOL) 50000 units CAPS capsule Take 50,000 Units by mouth every 7 (seven) days.     No current facility-administered medications for this visit.     Past Medical History:  Diagnosis Date  . Allergic rhinitis   . Anemia   . Breast cancer, left breast ([place]) 01/15/15   Invasive Mammary  . Chronic venous insufficiency   . Hematuria    negative workup - Dr. Reece Agar  . Hypertension   . Migraine headache   . MVP (mitral valve prolapse)   . PAF (paroxysmal atrial fibrillation) ([place])   . SVT (supraventricular tachycardia) ([place]) 06/2010   s/p AVNRT ablation  . Wears glasses     ROS:   All systems reviewed and negative except as noted in the HPI.   Past Surgical History:  Procedure Laterality Date  . A-FLUTTER ABLATION N/A 02/04/2017   Procedure: A-Flutter Ablation;  Surgeon: Evans Lance, MD;  Location: [facility];  Service: Cardiovascular;  Laterality: N/A;  . ABDOMINAL HYSTERECTOMY    . AV NODE ABLATION N/A 08/17/2017   Procedure: AV Node Ablation;  Surgeon: Evans Lance, MD;  Location: [facility];  Service:  Cardiovascular;  Laterality: N/A;  . BREAST LUMPECTOMY WITH RADIOACTIVE SEED LOCALIZATION Left 02/14/2015   Procedure: BREAST LUMPECTOMY WITH RADIOACTIVE SEED LOCALIZATION;  Surgeon: Alphonsa Overall, MD;  Location: [facility];  Service: General;  Laterality: Left;  . BREAST SURGERY  1990   rt br bx  . CARDIAC CATHETERIZATION  2011   ablasion  . CARDIOVERSION N/A 08/10/2017   Procedure: CARDIOVERSION;  Surgeon: Jerline Pain, MD;  Location: [facility];  Service: Cardiovascular;  Laterality: N/A;  . COLONOSCOPY    . DILATION AND CURETTAGE OF UTERUS    . Left Breast Biopsy  01/15/15  . PACEMAKER IMPLANT N/A 08/17/2017   Procedure: Pacemaker Implant;  Surgeon: Evans Lance, MD;  Location: [facility];  Service:  Cardiovascular;  Laterality: N/A;  . TUBAL LIGATION       Family History  Problem Relation Age of Onset  . Hypertension Mother   . Heart disease Mother   . Arthritis Mother   . Other Father        hardening of the arteries  . Heart Problems Sister        pacemaker  . Heart attack Brother   . Throat cancer Paternal Uncle   . Breast cancer Sister        early 81s  . Alzheimer's disease Sister   . Pulmonary disease Sister   . Breast cancer Daughter 68       Negative genetic testing   . Breast cancer Paternal Aunt        dx over 45  . Cancer Paternal Aunt        cancer in her leg  . Breast cancer Cousin        multiple paternal cousin     Social History   Socioeconomic History  . Marital status: Married    Spouse name: Not on file  . Number of children: 3  . Years of education: Not on file  . Highest education level: Not on file  Occupational History  . Not on file  Tobacco Use  . Smoking status: Never Smoker  . Smokeless tobacco: Never Used  . Tobacco comment: did smoke about 2cigs long time about 54 plus years ago  Vaping Use  . Vaping Use: Never used  Substance and Sexual Activity  . Alcohol use: Yes    Alcohol/week: 0.0 standard drinks    Comment: once a week  . Drug use: No  . Sexual activity: Not on file  Other Topics Concern  . Not on file  Social History Narrative   LIves with husband.    Retired.    Social Determinants of Health   Financial Resource Strain: Not on file  Food Insecurity: Not on file  Transportation Needs: Not on file  Physical Activity: Not on file  Stress: Not on file  Social Connections: Not on file  Intimate Partner Violence: Not on file     BP 102/68   Pulse 76   Ht 5\' 9"  (1.753 m)   Wt 158 lb (71.7 kg)   LMP  (LMP Unknown)   BMI 23.33 kg/m   Physical Exam:  Well appearing NAD HEENT: Unremarkable Neck:  No JVD, no thyromegally Lymphatics:  No adenopathy Back:  No CVA tenderness Lungs:  Clear with no  wheezes HEART:  Regular rate rhythm, no murmurs, no rubs, no clicks Abd:  soft, positive bowel sounds, no organomegally, no rebound, no guarding Ext:  2 plus pulses, no edema, no cyanosis,  no clubbing Skin:  No rashes no nodules Neuro:  CN II through XII intact, motor grossly intact  EKG - atrial fib with ventricular pacing  DEVICE  Normal device function.  See PaceArt for details.   Assess/Plan: 1. PVC's - she is much improved. She will continue amiodarone.  2. Atrial fib - she is s/p AV node ablation and her rates are controlled. 3. PPM - her Medtronic PPM is working normally.  4. Coags - she will continue her eliquis. We will follow.  Carleene Overlie Clell Trahan,MD

## 2021-03-27 NOTE — Patient Instructions (Addendum)
Medication Instructions:  Your physician recommends that you continue on your current medications as directed. Please refer to the Current Medication list given to you today.  Labwork: You will get lab work today:  BMP, CBC, liver panel and TSH.  Testing/Procedures: None ordered.  Follow-Up:  Your physician wants you to follow-up in: 6 months with Dr. Lovena Le.   You will receive a reminder letter in the mail two months in advance. If you don't receive a letter, please call our office to schedule the follow-up appointment.  Remote monitoring is used to monitor your Pacemaker from home. This monitoring reduces the number of office visits required to check your device to one time per year. It allows [place] to keep an eye on the functioning of your device to ensure it is working properly. You are scheduled for a device check from home on 05/26/2021. You may send your transmission at any time that day. If you have a wireless device, the transmission will be sent automatically. After your physician reviews your transmission, you will receive a postcard with your next transmission date.   Any Other Special Instructions Will Be Listed Below (If Applicable).  If you need a refill on your cardiac medications before your next appointment, please call your pharmacy.

## 2021-03-28 LAB — BASIC METABOLIC PANEL
BUN/Creatinine Ratio: 31 — ABNORMAL HIGH (ref 12–28)
BUN: 28 mg/dL — ABNORMAL HIGH (ref 8–27)
CO2: 20 mmol/L (ref 20–29)
Calcium: 9.1 mg/dL (ref 8.7–10.3)
Chloride: 99 mmol/L (ref 96–106)
Creatinine, Ser: 0.91 mg/dL (ref 0.57–1.00)
Glucose: 151 mg/dL — ABNORMAL HIGH (ref 65–99)
Potassium: 4 mmol/L (ref 3.5–5.2)
Sodium: 138 mmol/L (ref 134–144)
eGFR: 61 mL/min/{1.73_m2} (ref >59)

## 2021-03-28 LAB — HEPATIC FUNCTION PANEL
ALT: 15 IU/L (ref 0–32)
AST: 18 IU/L (ref 0–40)
Albumin: 4.1 g/dL (ref 3.6–4.6)
Alkaline Phosphatase: 106 IU/L (ref 44–121)
Bilirubin Total: 0.4 mg/dL (ref 0.0–1.2)
Bilirubin, Direct: 0.11 mg/dL (ref 0.00–0.40)
Total Protein: 7.5 g/dL (ref 6.0–8.5)

## 2021-03-28 LAB — CBC WITH DIFFERENTIAL/PLATELET
Basophils Absolute: 0 10*3/uL (ref 0.0–0.2)
Basos: 1 %
EOS (ABSOLUTE): 0.1 10*3/uL (ref 0.0–0.4)
Eos: 2 %
Hematocrit: 38.5 % (ref 34.0–46.6)
Hemoglobin: 12.4 g/dL (ref 11.1–15.9)
Immature Grans (Abs): 0 10*3/uL (ref 0.0–0.1)
Immature Granulocytes: 0 %
Lymphocytes Absolute: 1.1 10*3/uL (ref 0.7–3.1)
Lymphs: 19 %
MCH: 28.6 pg (ref 26.6–33.0)
MCHC: 32.2 g/dL (ref 31.5–35.7)
MCV: 89 fL (ref 79–97)
Monocytes Absolute: 0.4 10*3/uL (ref 0.1–0.9)
Monocytes: 7 %
Neutrophils Absolute: 4.4 10*3/uL (ref 1.4–7.0)
Neutrophils: 71 %
Platelets: 206 10*3/uL (ref 150–450)
RBC: 4.34 x10E6/uL (ref 3.77–5.28)
RDW: 14.6 % (ref 11.7–15.4)
WBC: 6.1 10*3/uL (ref 3.4–10.8)

## 2021-03-28 LAB — TSH: TSH: 1.34 u[IU]/mL (ref 0.450–4.500)

## 2021-04-02 DIAGNOSIS — R928 Other abnormal and inconclusive findings on diagnostic imaging of breast: Secondary | ICD-10-CM | POA: Diagnosis not present

## 2021-04-02 DIAGNOSIS — R922 Inconclusive mammogram: Secondary | ICD-10-CM | POA: Diagnosis not present

## 2021-04-08 DIAGNOSIS — I872 Venous insufficiency (chronic) (peripheral): Secondary | ICD-10-CM | POA: Diagnosis not present

## 2021-04-16 ENCOUNTER — Other Ambulatory Visit: Payer: Self-pay | Admitting: Internal Medicine

## 2021-04-16 NOTE — Telephone Encounter (Signed)
Eliquis 5mg  refill request received. Patient is 87 years old, weight-71.7kg, Crea-0.91 on 03/27/21, Diagnosis-Afib, and last seen by Dr. Lovena Le on 03/27/2021. Dose is appropriate based on dosing criteria. Will send in refill to requested pharmacy.

## 2021-05-05 DIAGNOSIS — J849 Interstitial pulmonary disease, unspecified: Secondary | ICD-10-CM | POA: Diagnosis not present

## 2021-05-05 DIAGNOSIS — M25511 Pain in right shoulder: Secondary | ICD-10-CM | POA: Diagnosis not present

## 2021-05-05 DIAGNOSIS — I4891 Unspecified atrial fibrillation: Secondary | ICD-10-CM | POA: Diagnosis not present

## 2021-05-05 DIAGNOSIS — I700 Atherosclerosis of aorta: Secondary | ICD-10-CM | POA: Diagnosis not present

## 2021-05-05 DIAGNOSIS — I10 Essential (primary) hypertension: Secondary | ICD-10-CM | POA: Diagnosis not present

## 2021-05-05 DIAGNOSIS — I872 Venous insufficiency (chronic) (peripheral): Secondary | ICD-10-CM | POA: Diagnosis not present

## 2021-05-05 DIAGNOSIS — D6869 Other thrombophilia: Secondary | ICD-10-CM | POA: Diagnosis not present

## 2021-05-06 DIAGNOSIS — H1132 Conjunctival hemorrhage, left eye: Secondary | ICD-10-CM | POA: Diagnosis not present

## 2021-05-25 ENCOUNTER — Encounter (HOSPITAL_BASED_OUTPATIENT_CLINIC_OR_DEPARTMENT_OTHER): Payer: Self-pay | Admitting: *Deleted

## 2021-05-25 ENCOUNTER — Other Ambulatory Visit: Payer: Self-pay

## 2021-05-25 ENCOUNTER — Emergency Department (HOSPITAL_BASED_OUTPATIENT_CLINIC_OR_DEPARTMENT_OTHER)
Admission: EM | Admit: 2021-05-25 | Discharge: 2021-05-25 | Disposition: A | Discharge status: Home / Self Care | Payer: Medicare Other | Attending: Emergency Medicine | Admitting: Emergency Medicine

## 2021-05-25 DIAGNOSIS — Z79899 Other long term (current) drug therapy: Secondary | ICD-10-CM | POA: Diagnosis not present

## 2021-05-25 DIAGNOSIS — R040 Epistaxis: Secondary | ICD-10-CM | POA: Diagnosis not present

## 2021-05-25 DIAGNOSIS — I4891 Unspecified atrial fibrillation: Secondary | ICD-10-CM | POA: Insufficient documentation

## 2021-05-25 DIAGNOSIS — Z91040 Latex allergy status: Secondary | ICD-10-CM | POA: Insufficient documentation

## 2021-05-25 DIAGNOSIS — I110 Hypertensive heart disease with heart failure: Secondary | ICD-10-CM | POA: Insufficient documentation

## 2021-05-25 DIAGNOSIS — Z7901 Long term (current) use of anticoagulants: Secondary | ICD-10-CM | POA: Diagnosis not present

## 2021-05-25 DIAGNOSIS — I5033 Acute on chronic diastolic (congestive) heart failure: Secondary | ICD-10-CM | POA: Diagnosis not present

## 2021-05-25 DIAGNOSIS — Z853 Personal history of malignant neoplasm of breast: Secondary | ICD-10-CM | POA: Diagnosis not present

## 2021-05-25 DIAGNOSIS — Z950 Presence of cardiac pacemaker: Secondary | ICD-10-CM | POA: Diagnosis not present

## 2021-05-25 DIAGNOSIS — E039 Hypothyroidism, unspecified: Secondary | ICD-10-CM | POA: Diagnosis not present

## 2021-05-25 LAB — CBC
HCT: 36.1 % (ref 36.0–46.0)
Hemoglobin: 11.7 g/dL — ABNORMAL LOW (ref 12.0–15.0)
MCH: 29 pg (ref 26.0–34.0)
MCHC: 32.4 g/dL (ref 30.0–36.0)
MCV: 89.6 fL (ref 80.0–100.0)
Platelets: 195 10*3/uL (ref 150–400)
RBC: 4.03 MIL/uL (ref 3.87–5.11)
RDW: 14.9 % (ref 11.5–15.5)
WBC: 5.6 10*3/uL (ref 4.0–10.5)
nRBC: 0 % (ref 0.0–0.2)

## 2021-05-25 LAB — PROTIME-INR
INR: 1.2 (ref 0.8–1.2)
Prothrombin Time: 15.1 seconds (ref 11.4–15.2)

## 2021-05-25 MED ORDER — ONDANSETRON 4 MG PO TBDP
4.0000 mg | ORAL_TABLET | Freq: Once | ORAL | Status: AC
  Administered 2021-05-25: 4 mg via ORAL

## 2021-05-25 MED ORDER — LIDOCAINE HCL URETHRAL/MUCOSAL 2 % EX GEL
1.0000 "application " | Freq: Once | CUTANEOUS | Status: AC
  Administered 2021-05-25: 1 via TOPICAL
  Filled 2021-05-25: qty 11

## 2021-05-25 MED ORDER — OXYMETAZOLINE HCL 0.05 % NA SOLN
1.0000 | Freq: Once | NASAL | Status: AC
  Administered 2021-05-25: 1 via NASAL
  Filled 2021-05-25: qty 30

## 2021-05-25 NOTE — ED Triage Notes (Signed)
Nosebleed earlier today approx 30 min. Presently bleeding over an hour. Pt is on blood thinners

## 2021-05-25 NOTE — ED Notes (Signed)
RN was attempting to d/c patient when increased blood started coming out. MD made aware.

## 2021-05-25 NOTE — ED Provider Notes (Addendum)
[facility] EMERGENCY DEPT Provider Note   CSN: [id number] Arrival date & time: 05/25/21  1520     History Chief Complaint  Patient presents with   Epistaxis    [name] is a 87 y.o. female.  HPI Patient is treated with Eliquis for atrial fibrillation.  She developed nosebleed last night.  She reports it bled for a few hours and she was able to get it stopped.  She reports started bleeding again today and bled fairly large amounts with dark blood and some clots.  Patient denies any prior history of nosebleeds.  Is bleeding from the left side.  No fevers, no chills, no pain.  No lightheadedness or dizziness.    Past Medical History:  Diagnosis Date   Allergic rhinitis    Anemia    Breast cancer, left breast ([place]) 01/15/15   Invasive Mammary   Chronic venous insufficiency    Hematuria    negative workup - Dr. Reece Agar   Hypertension    Migraine headache    MVP (mitral valve prolapse)    PAF (paroxysmal atrial fibrillation) ([facility])    SVT (supraventricular tachycardia) ([place]) 06/2010   s/p AVNRT ablation   Wears glasses     Patient Active Problem List   Diagnosis Date Noted   Chronic diastolic heart failure ([place]) 03/27/2021   PVC's (premature ventricular contractions) 09/23/2020   Pacemaker 01/10/2020   Pain in left knee 84/69/6295   Acute diastolic CHF (congestive heart failure) ([place]) 08/11/2017   Acute CHF (congestive heart failure) ([place]) 08/11/2017   Acute CHF ([place]) 08/11/2017   Persistent atrial fibrillation (H[facility])    ILD (interstitial lung disease) ([place]) 02/21/2017   CHF (congestive heart failure) ([place]) 02/21/2017   Dyspnea and respiratory abnormality 02/11/2017   Atrial flutter ([place]) 02/04/2017   Chronic atrial fibrillation ([place]) 01/19/2017   Abnormality of gait and mobility 01/19/2017   Hypothyroidism 01/19/2017   Other specified disorders of bone density and structure, unspecified site 01/19/2017   Genetic testing 03/13/2015   Paresthesia of  skin 03/03/2015   Breast cancer of lower-outer quadrant of left female breast ([place]) 02/06/2015   Hematuria    PAF (paroxysmal atrial fibrillation) ([place])    Chronic venous insufficiency    SVT (supraventricular tachycardia) ([place]) 06/12/2010   Essential hypertension 04/22/2010   MITRAL VALVE PROLAPSE 04/22/2010   Atypical atrial flutter ([place]) 04/22/2010   SUPRAVENTRICULAR TACHYCARDIA 04/22/2010    Past Surgical History:  Procedure Laterality Date   A-FLUTTER ABLATION N/A 02/04/2017   Procedure: A-Flutter Ablation;  Surgeon: Evans Lance, MD;  Location: [facility];  Service: Cardiovascular;  Laterality: N/A;   ABDOMINAL HYSTERECTOMY     AV NODE ABLATION N/A 08/17/2017   Procedure: AV Node Ablation;  Surgeon: Evans Lance, MD;  Location: [facility];  Service: Cardiovascular;  Laterality: N/A;   BREAST LUMPECTOMY WITH RADIOACTIVE SEED LOCALIZATION Left 02/14/2015   Procedure: BREAST LUMPECTOMY WITH RADIOACTIVE SEED LOCALIZATION;  Surgeon: Alphonsa Overall, MD;  Location: [facility];  Service: General;  Laterality: Left;   BREAST SURGERY  1990   rt br bx   CARDIAC CATHETERIZATION  2011   ablasion   CARDIOVERSION N/A 08/10/2017   Procedure: CARDIOVERSION;  Surgeon: Jerline Pain, MD;  Location: [facility];  Service: Cardiovascular;  Laterality: N/A;   COLONOSCOPY     DILATION AND CURETTAGE OF UTERUS     Left Breast Biopsy  01/15/15   PACEMAKER IMPLANT N/A 08/17/2017   Procedure: Pacemaker  Implant;  Surgeon: Evans Lance, MD;  Location: [facility];  Service: Cardiovascular;  Laterality: N/A;   TUBAL LIGATION       OB History   No obstetric history on file.     Family History  Problem Relation Age of Onset   Hypertension Mother    Heart disease Mother    Arthritis Mother    Other Father        hardening of the arteries   Heart Problems Sister        pacemaker   Heart attack Brother    Throat cancer Paternal Uncle    Breast cancer Sister         early 55s   Alzheimer's disease Sister    Pulmonary disease Sister    Breast cancer Daughter 11       Negative genetic testing    Breast cancer Paternal Aunt        dx over 19   Cancer Paternal Aunt        cancer in her leg   Breast cancer Cousin        multiple paternal cousin    Social History   Tobacco Use   Smoking status: Never   Smokeless tobacco: Never   Tobacco comments:    did smoke about 2cigs long time about 54 plus years ago  Vaping Use   Vaping Use: Never used  Substance Use Topics   Alcohol use: Yes    Alcohol/week: 0.0 standard drinks    Comment: once a week   Drug use: No    Home Medications Prior to Admission medications   Medication Sig Start Date End Date Taking? Authorizing Provider  amiodarone (PACERONE) 200 MG tablet Take 1 tablet (200 mg total) by mouth daily. 10/17/20   Evans Lance, MD  antiseptic oral rinse (BIOTENE) LIQD 15 mLs by Mouth Rinse route as needed for dry mouth.    [provider]  Carboxymethylcellul-Glycerin (LUBRICATING EYE DROPS OP) Apply 1 drop to eye daily as needed (dry eyes).    [provider]  Cyanocobalamin (B-12) 1000 MCG TABS Take 1 tablet by mouth daily.    [provider]  ELIQUIS 5 MG TABS tablet TAKE 1 TABLET BY MOUTH  TWICE DAILY 04/16/21   Evans Lance, MD  hydrochlorothiazide (HYDRODIURIL) 25 MG tablet TAKE 1 TABLET ONCE DAILY. 03/06/21   Evans Lance, MD  HYDROcodone-acetaminophen [facility]) 10-325 MG tablet Take 1 tablet by mouth 3 (three) times daily as needed for pain. 08/11/20   [provider]  levothyroxine (SYNTHROID) 100 MCG tablet Take 100 mcg by mouth daily before breakfast.    [provider]  losartan (COZAAR) 50 MG tablet Take 1 tablet (50 mg total) by mouth daily. 11/28/20 02/26/21  Evans Lance, MD  metoprolol succinate (TOPROL XL) 25 MG 24 hr tablet Take 1 tablet (25 mg total) by mouth daily. 09/23/20   Evans Lance, MD  MYRBETRIQ 50 MG TB24  tablet Take 50 mg by mouth daily. 04/29/20   [provider]  raNITIdine HCl (WAL-ZAN 75 PO) Take 1 tablet by mouth daily as needed.     [provider]  traMADol (ULTRAM) 50 MG tablet Take 50 mg by mouth 3 (three) times daily as needed. 04/10/20   [provider]  triamcinolone cream (KENALOG) 0.1 % Apply 1 application topically daily as needed.  07/17/19   [provider]  Vitamin D, Ergocalciferol, (DRISDOL) 50000 units CAPS capsule  Take 50,000 Units by mouth every 7 (seven) days.    [provider]    Allergies    Adhesive [tape], Codeine, Latex, and Percodan [oxycodone-aspirin]  Review of Systems   Review of Systems Constitutional: No fevers no chills no malaise Respiratory: No shortness of breath no cough Cardiovascular no syncope no lightheadedness no chest pain Physical Exam Updated Vital Signs BP (!) 158/95 (BP Location: Right Arm)   Pulse 77   Temp 98.2 F (36.8 C)   Resp 18   Ht 5' 8.5" (1.74 m)   Wt 69.9 kg   LMP  (LMP Unknown)   SpO2 96%   BMI 23.08 kg/m   Physical Exam Constitutional:      Appearance: Normal appearance.  HENT:     Nose:     Comments: Patient has small amount of fresh clot adherent to the anterior anterior nasal passage.  The passages actually clear without clot obstructing the left nare.  Oral exam shows some fresh blood streaking in the posterior oropharynx.  Patient is not actively bleeding at time of examination. Pulmonary:     Effort: Pulmonary effort is normal.  Skin:    General: Skin is warm and dry.  Neurological:     General: No focal deficit present.     Mental Status: She is alert and oriented to person, place, and time.  Psychiatric:        Mood and Affect: Mood normal.    ED Results / Procedures / Treatments   Labs (all labs ordered are listed, but only abnormal results are displayed) Labs Reviewed  CBC - Abnormal; Notable for the following components:      Result Value   Hemoglobin  11.7 (*)    All other components within normal limits  PROTIME-INR    EKG None  Radiology No results found.  Procedures .Epistaxis Management  Date/Time: 05/25/2021 5:34 PM Performed by: Charlesetta Shanks, MD Authorized by: Charlesetta Shanks, MD   Consent:    Consent obtained:  Verbal   Consent given by:  Patient   Risks discussed:  Bleeding, infection, nasal injury and pain Anesthesia:    Anesthesia method:  Topical application   Topical anesthetic:  Lidocaine gel Procedure details:    Treatment site:  L posterior   Treatment method:  Merocel sponge   Treatment complexity:  Extensive   Treatment episode: initial   Post-procedure details:    Assessment:  Bleeding stopped   Procedure completion:  Tolerated well, no immediate complications Comments:     Long Merisel sponge placed in the left nare.  Patient tolerated well.  No recurrence of bleeding.   Patient developed rebleeding around the sponge tampon. sponge was removed.  There is remained clear.  There was no clot for removal.  Patient could breathe through the nose.  Rapid Rhino rocket inflatable balloon anterior posterior placed.  Placed without difficulty.  Inflated to  5 cc.  As tolerated by patient.  Was rechecked there did remain a slight trickle of blood from the anterior portion.  No clot in the back.  Rapid Rhino was pulled forward slightly and then inflated to 7 cc.  Observed for another 45 minutes with no additional bleeding. Medications Ordered in ED Medications  lidocaine (XYLOCAINE) 2 % jelly 1 application (has no administration in time range)  oxymetazoline (AFRIN) 0.05 % nasal spray 1 spray (1 spray Each Nare Given 05/25/21 1608)    ED Course  I have reviewed the triage vital signs and the nursing  notes.  Pertinent labs & imaging results that were available during my care of the patient were reviewed by me and considered in my medical decision making (see chart for details).    MDM  Rules/Calculators/A&P                          Patient presents with epistaxis from the left nare.  She does take Eliquis.  There was some fresh clot in the anterior nare on the left but no clot obstructing the nasal passage.  Merocel sponge packing placed and no ongoing bleeding.  Patient counseled on return precautions.  Follow-up plan with removal of packing within 2 to 3 days reviewed.  Patient did have repeat bleeding prior to discharge and was brought back for repacking.  See note.  With repacking at this time no further bleeding.  They are counseled on the nature of bleeding with Eliquis and risk of rebleeding.  Patient will hold Eliquis tonight and tomorrow morning and notify her cardiologist Final Clinical Impression(s) / ED Diagnoses Final diagnoses:  Epistaxis  Anticoagulated    Rx / DC Orders ED Discharge Orders     None        Charlesetta Shanks, MD 05/25/21 1736    Charlesetta Shanks, MD 05/25/21 Rodolph Bong    Charlesetta Shanks, MD 05/25/21 1936

## 2021-05-25 NOTE — Discharge Instructions (Addendum)
1.  Try to leave your packing in place for 2 days.  It should be removed within 2 to 3 days.  Try to arrange a follow-up appoint with [facility] ear nose throat specialist as listed in discharge instructions. if you cannot be seen in the office, return to the emergency department or your family doctor for removal of your nasal packing. 2.  You may discuss the ongoing use of Eliquis with your cardiologist.  They can review the risk benefit of Eliquis and atrial fibrillation.

## 2021-05-26 ENCOUNTER — Ambulatory Visit (INDEPENDENT_AMBULATORY_CARE_PROVIDER_SITE_OTHER): Payer: Medicare Other

## 2021-05-26 ENCOUNTER — Encounter: Payer: Self-pay | Admitting: Internal Medicine

## 2021-05-26 DIAGNOSIS — I482 Chronic atrial fibrillation, unspecified: Secondary | ICD-10-CM | POA: Diagnosis not present

## 2021-05-26 LAB — CUP PACEART REMOTE DEVICE CHECK
Battery Remaining Longevity: 19 mo
Battery Voltage: 2.92 V
Brady Statistic AP VP Percent: 0 %
Brady Statistic AP VS Percent: 0 %
Brady Statistic AS VP Percent: 99.99 %
Brady Statistic AS VS Percent: 0.01 %
Brady Statistic RA Percent Paced: 0 %
Brady Statistic RV Percent Paced: 99.99 %
Date Time Interrogation Session: 20220613222729
Implantable Lead Implant Date: 20180905
Implantable Lead Implant Date: 20180905
Implantable Lead Location: 753859
Implantable Lead Location: 753860
Implantable Lead Model: 3830
Implantable Lead Model: 5076
Implantable Pulse Generator Implant Date: 20180905
Lead Channel Impedance Value: 266 Ohm
Lead Channel Impedance Value: 342 Ohm
Lead Channel Impedance Value: 380 Ohm
Lead Channel Impedance Value: 551 Ohm
Lead Channel Pacing Threshold Amplitude: 2.375 V
Lead Channel Pacing Threshold Pulse Width: 0.4 ms
Lead Channel Sensing Intrinsic Amplitude: 1.5 mV
Lead Channel Sensing Intrinsic Amplitude: 1.5 mV
Lead Channel Sensing Intrinsic Amplitude: 2.875 mV
Lead Channel Sensing Intrinsic Amplitude: 3.125 mV
Lead Channel Setting Pacing Amplitude: 5 V
Lead Channel Setting Pacing Pulse Width: 1 ms
Lead Channel Setting Sensing Sensitivity: 0.9 mV

## 2021-05-26 NOTE — Telephone Encounter (Signed)
This encounter was created in error - please disregard.

## 2021-05-26 NOTE — Telephone Encounter (Signed)
Reviewed with PharmD Lenna Sciara). Confirmed with patient she has not had a stroke. Informed her that it would be ideal for her to restart Eliquis tonight, she may start tomorrow if she'd feel more comfortable. She will remove packing Friday and call if bleeding recurs. She was grateful for call and agrees with plan.

## 2021-05-26 NOTE — Telephone Encounter (Signed)
Pt is experiencing nose bleeds and as a result stopped taking eliquis, pt would like to know when she should start back taking it or how long she would be able to go without.

## 2021-05-29 DIAGNOSIS — R040 Epistaxis: Secondary | ICD-10-CM | POA: Diagnosis not present

## 2021-05-29 DIAGNOSIS — I4811 Longstanding persistent atrial fibrillation: Secondary | ICD-10-CM | POA: Diagnosis not present

## 2021-05-29 DIAGNOSIS — Z7901 Long term (current) use of anticoagulants: Secondary | ICD-10-CM | POA: Diagnosis not present

## 2021-06-16 DIAGNOSIS — H00021 Hordeolum internum right upper eyelid: Secondary | ICD-10-CM | POA: Diagnosis not present

## 2021-06-17 NOTE — Progress Notes (Signed)
Remote pacemaker transmission.   

## 2021-08-25 ENCOUNTER — Ambulatory Visit (INDEPENDENT_AMBULATORY_CARE_PROVIDER_SITE_OTHER): Payer: Medicare Other

## 2021-08-25 DIAGNOSIS — K219 Gastro-esophageal reflux disease without esophagitis: Secondary | ICD-10-CM | POA: Diagnosis not present

## 2021-08-25 DIAGNOSIS — Z79899 Other long term (current) drug therapy: Secondary | ICD-10-CM | POA: Diagnosis not present

## 2021-08-25 DIAGNOSIS — I10 Essential (primary) hypertension: Secondary | ICD-10-CM | POA: Diagnosis not present

## 2021-08-25 DIAGNOSIS — I493 Ventricular premature depolarization: Secondary | ICD-10-CM | POA: Diagnosis not present

## 2021-08-25 DIAGNOSIS — E039 Hypothyroidism, unspecified: Secondary | ICD-10-CM | POA: Diagnosis not present

## 2021-08-25 DIAGNOSIS — I5030 Unspecified diastolic (congestive) heart failure: Secondary | ICD-10-CM | POA: Diagnosis not present

## 2021-08-25 DIAGNOSIS — Z1389 Encounter for screening for other disorder: Secondary | ICD-10-CM | POA: Diagnosis not present

## 2021-08-25 DIAGNOSIS — Z0000 Encounter for general adult medical examination without abnormal findings: Secondary | ICD-10-CM | POA: Diagnosis not present

## 2021-08-25 DIAGNOSIS — J849 Interstitial pulmonary disease, unspecified: Secondary | ICD-10-CM | POA: Diagnosis not present

## 2021-08-25 DIAGNOSIS — Z23 Encounter for immunization: Secondary | ICD-10-CM | POA: Diagnosis not present

## 2021-08-25 DIAGNOSIS — Z950 Presence of cardiac pacemaker: Secondary | ICD-10-CM | POA: Diagnosis not present

## 2021-08-25 DIAGNOSIS — I4891 Unspecified atrial fibrillation: Secondary | ICD-10-CM | POA: Diagnosis not present

## 2021-08-25 DIAGNOSIS — D6869 Other thrombophilia: Secondary | ICD-10-CM | POA: Diagnosis not present

## 2021-08-25 DIAGNOSIS — I700 Atherosclerosis of aorta: Secondary | ICD-10-CM | POA: Diagnosis not present

## 2021-08-25 DIAGNOSIS — R7303 Prediabetes: Secondary | ICD-10-CM | POA: Diagnosis not present

## 2021-08-25 LAB — CUP PACEART REMOTE DEVICE CHECK
Battery Remaining Longevity: 17 mo
Battery Voltage: 2.91 V
Brady Statistic AP VP Percent: 0 %
Brady Statistic AP VS Percent: 0 %
Brady Statistic AS VP Percent: 100 %
Brady Statistic AS VS Percent: 0 %
Brady Statistic RA Percent Paced: 0 %
Brady Statistic RV Percent Paced: 100 %
Date Time Interrogation Session: 20220912225733
Implantable Lead Implant Date: 20180905
Implantable Lead Implant Date: 20180905
Implantable Lead Location: 753859
Implantable Lead Location: 753860
Implantable Lead Model: 3830
Implantable Lead Model: 5076
Implantable Pulse Generator Implant Date: 20180905
Lead Channel Impedance Value: 285 Ohm
Lead Channel Impedance Value: 342 Ohm
Lead Channel Impedance Value: 380 Ohm
Lead Channel Impedance Value: 551 Ohm
Lead Channel Pacing Threshold Amplitude: 2.375 V
Lead Channel Pacing Threshold Pulse Width: 0.4 ms
Lead Channel Sensing Intrinsic Amplitude: 1.5 mV
Lead Channel Sensing Intrinsic Amplitude: 1.5 mV
Lead Channel Sensing Intrinsic Amplitude: 2.875 mV
Lead Channel Sensing Intrinsic Amplitude: 3.125 mV
Lead Channel Setting Pacing Amplitude: 5 V
Lead Channel Setting Pacing Pulse Width: 1 ms
Lead Channel Setting Sensing Sensitivity: 0.9 mV

## 2021-09-02 NOTE — Progress Notes (Signed)
Remote pacemaker transmission.   

## 2021-09-16 ENCOUNTER — Other Ambulatory Visit: Payer: Self-pay | Admitting: Internal Medicine

## 2021-09-21 DIAGNOSIS — H18003 Unspecified corneal deposit, bilateral: Secondary | ICD-10-CM | POA: Diagnosis not present

## 2021-09-21 NOTE — Progress Notes (Signed)
Cardiology Office Note Date:  09/30/2021  Patient ID:  [name], [name] [birth year], MRN [id number] PCP:  Lajean Manes, MD  Electrophysiologist: Dr. Lovena Le     Chief Complaint: 6 mo  History of Present Illness: [name] is a 87 y.o. female with history of SVT (s/p AVNRT ablation), AFlutter > Afib >> AV node ablation/PPM, PVCs (intolerant of flecainide and multaq) on amiodarone, venous insufficiency, HTN.  She comes today to be seen for dr. Lovena Le, last seen by him April 2022, PVCs/symptoms improved on amiodarone, no changes were made.  TODAY She feels like she is doing quite well. This summer had a nose bleed that required an ER visit and ENT follow up.  No particular findings that she recalls, had packing a few days and has not happened again Denies any CP, palpitations or SOB No syncope She does not exercise but stays busy and active  Device information MDT dual chamber PPM implanted 08/17/2017 Programmed VVIR  Past Medical History:  Diagnosis Date   Allergic rhinitis    Anemia    Breast cancer, left breast ([facility]) 01/15/15   Invasive Mammary   Chronic venous insufficiency    Hematuria    negative workup - Dr. Reece Agar   Hypertension    Migraine headache    MVP (mitral valve prolapse)    PAF (paroxysmal atrial fibrillation) (HCC)    SVT (supraventricular tachycardia) ([facility]) 06/2010   s/p AVNRT ablation   Wears glasses     Past Surgical History:  Procedure Laterality Date   A-FLUTTER ABLATION N/A 02/04/2017   Procedure: A-Flutter Ablation;  Surgeon: Evans Lance, MD;  Location: [facility];  Service: Cardiovascular;  Laterality: N/A;   ABDOMINAL HYSTERECTOMY     AV NODE ABLATION N/A 08/17/2017   Procedure: AV Node Ablation;  Surgeon: Evans Lance, MD;  Location: [facility];  Service: Cardiovascular;  Laterality: N/A;   BREAST LUMPECTOMY WITH RADIOACTIVE SEED LOCALIZATION Left 02/14/2015   Procedure: BREAST LUMPECTOMY WITH RADIOACTIVE SEED  LOCALIZATION;  Surgeon: Alphonsa Overall, MD;  Location: [facility];  Service: General;  Laterality: Left;   BREAST SURGERY  1990   rt br bx   CARDIAC CATHETERIZATION  2011   ablasion   CARDIOVERSION N/A 08/10/2017   Procedure: CARDIOVERSION;  Surgeon: Jerline Pain, MD;  Location: [facility];  Service: Cardiovascular;  Laterality: N/A;   COLONOSCOPY     DILATION AND CURETTAGE OF UTERUS     Left Breast Biopsy  01/15/15   PACEMAKER IMPLANT N/A 08/17/2017   Procedure: Pacemaker Implant;  Surgeon: Evans Lance, MD;  Location: [facility];  Service: Cardiovascular;  Laterality: N/A;   TUBAL LIGATION      Current Outpatient Medications  Medication Sig Dispense Refill   amiodarone (PACERONE) 200 MG tablet Take 1 tablet (200 mg total) by mouth daily. 30 tablet 11   antiseptic oral rinse (BIOTENE) LIQD 15 mLs by Mouth Rinse route as needed for dry mouth.     Carboxymethylcellul-Glycerin (LUBRICATING EYE DROPS OP) Apply 1 drop to eye daily as needed (dry eyes).     Cyanocobalamin (B-12) 1000 MCG TABS Take 1 tablet by mouth daily.     ELIQUIS 5 MG TABS tablet TAKE 1 TABLET BY MOUTH  TWICE DAILY 180 tablet 2   hydrochlorothiazide (HYDRODIURIL) 25 MG tablet TAKE 1 TABLET ONCE DAILY. 90 tablet 2   levothyroxine (SYNTHROID) 100 MCG tablet Take 100 mcg by mouth daily before breakfast.  losartan (COZAAR) 50 MG tablet Take 1 tablet (50 mg total) by mouth daily. 90 tablet 3   metoprolol succinate (TOPROL-XL) 25 MG 24 hr tablet TAKE 1 TABLET ONCE DAILY. 30 tablet 6   Vitamin D, Ergocalciferol, (DRISDOL) 50000 units CAPS capsule Take 50,000 Units by mouth every 7 (seven) days.     HYDROcodone-acetaminophen (NORCO) 10-325 MG tablet Take 1 tablet by mouth 3 (three) times daily as needed for pain. (Patient not taking: Reported on 09/30/2021)     MYRBETRIQ 50 MG TB24 tablet Take 50 mg by mouth daily. (Patient not taking: Reported on 09/30/2021)     raNITIdine HCl (WAL-ZAN 75 PO) Take 1  tablet by mouth daily as needed.  (Patient not taking: Reported on 09/30/2021)     traMADol (ULTRAM) 50 MG tablet Take 50 mg by mouth 3 (three) times daily as needed. (Patient not taking: Reported on 09/30/2021)     triamcinolone cream (KENALOG) 0.1 % Apply 1 application topically daily as needed.  (Patient not taking: Reported on 09/30/2021)     No current facility-administered medications for this visit.    Allergies:   Adhesive [tape], Codeine, Latex, and Percodan [oxycodone-aspirin]   Social History:  The patient  reports that she has never smoked. She has never used smokeless tobacco. She reports current alcohol use. She reports that she does not use drugs.   Family History:  The patient's family history includes Alzheimer's disease in her sister; Arthritis in her mother; Breast cancer in her cousin, paternal aunt, and sister; Breast cancer (age of onset: 7) in her daughter; Cancer in her paternal aunt; Heart Problems in her sister; Heart attack in her brother; Heart disease in her mother; Hypertension in her mother; Other in her father; Pulmonary disease in her sister; Throat cancer in her paternal uncle.  ROS:  Please see the history of present illness.    All other systems are reviewed and otherwise negative.   PHYSICAL EXAM:  VS:  BP 118/64   Pulse 77   Ht 5\' 9"  (1.753 m)   Wt 165 lb (74.8 kg)   LMP  (LMP Unknown)   SpO2 94%   BMI 24.37 kg/m  BMI: Body mass index is 24.37 kg/m. Well nourished, well developed, in no acute distress HEENT: normocephalic, atraumatic Neck: no JVD, carotid bruits or masses Cardiac:  RRR; (paced) no significant murmurs, no rubs, or gallops Lungs:  CTA b/l, no wheezing, rhonchi or rales Abd: soft, nontender MS: age appropriate atrophy Ext: no edema Skin: warm and dry, no rash Neuro:  No gross deficits appreciated Psych: euthymic mood, full affect  PPM site is stable, no tethering or discomfort   EKG:  done today and reviewed by  yself Aflutter, V paced  Device interrogation done today and reviewed by myself:  Battery is nearing ERI (1.3years) Programmed VVIR RV auto threshold was "high" and output at 5V Threshold testing today  0.5/1.0 0.5/1.0 0.75/0.4 RV lead out put changed to 2.5/1.0 and turned to monitor   08/11/2017: TTE Study Conclusions  - Left ventricle: The cavity size was normal. Systolic function was    normal. The estimated ejection fraction was in the range of 55%    to 60%. Wall motion was normal; there were no regional wall    motion abnormalities. Features are consistent with a pseudonormal    left ventricular filling pattern, with concomitant abnormal    relaxation and increased filling pressure (grade 2 diastolic    dysfunction). Doppler parameters are consistent with  high    ventricular filling pressure.  - Aortic valve: There was no regurgitation.  - Mitral valve: Transvalvular velocity was within the normal range.    There was no evidence for stenosis. There was trivial    regurgitation.  - Left atrium: The atrium was severely dilated.  - Right ventricle: The cavity size was normal. Wall thickness was    normal. Systolic function was normal.  - Tricuspid valve: There was no regurgitation.  - Pericardium, extracardiac: There was a left pleural effusion.    02/04/2017; EPS/ablation/DCCV Conclusion: Invasive EP study demonstrating left atrial flutter as well as AV node reentrant tachycardia with successful DC cardioversion back to sinus rhythm. The patient will be treated with medical therapy and ongoing anticoagulation.  Recent Labs: 03/27/2021: ALT 15; BUN 28; Creatinine, Ser 0.91; Potassium 4.0; Sodium 138; TSH 1.340 05/25/2021: Hemoglobin 11.7; Platelets 195  No results found for requested labs within last 8760 hours.   CrCl cannot be calculated (Patient's most recent lab result is older than the maximum 21 days allowed.).   Wt Readings from Last 3 Encounters:  09/30/21 165 lb  (74.8 kg)  05/25/21 154 lb (69.9 kg)  03/27/21 158 lb (71.7 kg)     Other studies reviewed: Additional studies/records reviewed today include: summarized above  ASSESSMENT AND PLAN:  Permanent Afib S/p AVnode ablation CHA2DS2Vasc is 4, on eliquis, appropriately dosed Labs today  2. PPM Last in clinic RV lead testing noted similar to mine today and appears HIGH autothreshold on remotes for several months Lead testing today with good threshold and programmed as above  3. HTN Looks OK, no changes  4. Frequent, symptomatic PVCs Amiodarone Discussed reducing to 100mg  daily, she feels well, and prefers no changes, not having any PVCs/palpitations, feeling well Labs today  Disposition: F/u with remotes as usual, in clinic 48mo, sooner if needed  Current medicines are reviewed at length with the patient today.  The patient did not have any concerns regarding medicines.  Venetia Night, PA-C 09/30/2021 1:15 PM     [address] [address] [phone] (office)  [phone] (fax)

## 2021-09-30 ENCOUNTER — Encounter: Payer: Self-pay | Admitting: Physician Assistant

## 2021-09-30 ENCOUNTER — Ambulatory Visit: Payer: Medicare Other | Admitting: Physician Assistant

## 2021-09-30 ENCOUNTER — Other Ambulatory Visit: Payer: Self-pay

## 2021-09-30 VITALS — BP 118/64 | HR 77 | Ht 69.0 in | Wt 165.0 lb

## 2021-09-30 DIAGNOSIS — I4821 Permanent atrial fibrillation: Secondary | ICD-10-CM

## 2021-09-30 DIAGNOSIS — I5031 Acute diastolic (congestive) heart failure: Secondary | ICD-10-CM | POA: Diagnosis not present

## 2021-09-30 DIAGNOSIS — I493 Ventricular premature depolarization: Secondary | ICD-10-CM

## 2021-09-30 DIAGNOSIS — Z950 Presence of cardiac pacemaker: Secondary | ICD-10-CM | POA: Diagnosis not present

## 2021-09-30 DIAGNOSIS — I10 Essential (primary) hypertension: Secondary | ICD-10-CM | POA: Diagnosis not present

## 2021-09-30 DIAGNOSIS — Z79899 Other long term (current) drug therapy: Secondary | ICD-10-CM

## 2021-09-30 LAB — CBC
Hematocrit: 36.9 % (ref 34.0–46.6)
Hemoglobin: 12.2 g/dL (ref 11.1–15.9)
MCH: 29.6 pg (ref 26.6–33.0)
MCHC: 33.1 g/dL (ref 31.5–35.7)
MCV: 90 fL (ref 79–97)
Platelets: 211 10*3/uL (ref 150–450)
RBC: 4.12 x10E6/uL (ref 3.77–5.28)
RDW: 13.8 % (ref 11.7–15.4)
WBC: 5.9 10*3/uL (ref 3.4–10.8)

## 2021-09-30 LAB — CUP PACEART INCLINIC DEVICE CHECK
Battery Remaining Longevity: 21 mo
Battery Voltage: 2.91 V
Brady Statistic AP VP Percent: 0 %
Brady Statistic AP VS Percent: 0 %
Brady Statistic AS VP Percent: 100 %
Brady Statistic AS VS Percent: 0 %
Brady Statistic RA Percent Paced: 0 %
Brady Statistic RV Percent Paced: 100 %
Date Time Interrogation Session: 20221019134805
Implantable Lead Implant Date: 20180905
Implantable Lead Implant Date: 20180905
Implantable Lead Location: 753859
Implantable Lead Location: 753860
Implantable Lead Model: 3830
Implantable Lead Model: 5076
Implantable Pulse Generator Implant Date: 20180905
Lead Channel Impedance Value: 285 Ohm
Lead Channel Impedance Value: 342 Ohm
Lead Channel Impedance Value: 399 Ohm
Lead Channel Impedance Value: 551 Ohm
Lead Channel Pacing Threshold Amplitude: 2.375 V
Lead Channel Pacing Threshold Pulse Width: 0.4 ms
Lead Channel Sensing Intrinsic Amplitude: 1.5 mV
Lead Channel Sensing Intrinsic Amplitude: 1.5 mV
Lead Channel Sensing Intrinsic Amplitude: 2.875 mV
Lead Channel Sensing Intrinsic Amplitude: 3.125 mV
Lead Channel Setting Pacing Amplitude: 2.5 V
Lead Channel Setting Pacing Pulse Width: 1 ms
Lead Channel Setting Sensing Sensitivity: 0.9 mV

## 2021-09-30 LAB — COMPREHENSIVE METABOLIC PANEL
ALT: 21 IU/L (ref 0–32)
AST: 27 IU/L (ref 0–40)
Albumin/Globulin Ratio: 1.5 (ref 1.2–2.2)
Albumin: 4.4 g/dL (ref 3.6–4.6)
Alkaline Phosphatase: 111 IU/L (ref 44–121)
BUN/Creatinine Ratio: 24 (ref 12–28)
BUN: 22 mg/dL (ref 8–27)
Bilirubin Total: 0.4 mg/dL (ref 0.0–1.2)
CO2: 25 mmol/L (ref 20–29)
Calcium: 9 mg/dL (ref 8.7–10.3)
Chloride: 100 mmol/L (ref 96–106)
Creatinine, Ser: 0.92 mg/dL (ref 0.57–1.00)
Globulin, Total: 3 g/dL (ref 1.5–4.5)
Glucose: 76 mg/dL (ref 70–99)
Potassium: 4.4 mmol/L (ref 3.5–5.2)
Sodium: 138 mmol/L (ref 134–144)
Total Protein: 7.4 g/dL (ref 6.0–8.5)
eGFR: 61 mL/min/{1.73_m2} (ref >59)

## 2021-09-30 LAB — TSH: TSH: 9.42 u[IU]/mL — ABNORMAL HIGH (ref 0.450–4.500)

## 2021-09-30 NOTE — Patient Instructions (Signed)
Medication Instructions:   Your physician recommends that you continue on your current medications as directed. Please refer to the Current Medication list given to you today.  *If you need a refill on your cardiac medications before your next appointment, please call your pharmacy*   Lab Work:  CMET  CBC TSH  TODAY   If you have labs (blood work) drawn today and your tests are completely normal, you will receive your results only by: [facility] (if you have MyChart) OR A paper copy in the mail If you have any lab test that is abnormal or we need to change your treatment, we will call you to review the results.   Testing/Procedures: NONE ORDERED  TODAY    Follow-Up: At [facility], you and your health needs are our priority.  As part of our continuing mission to provide you with exceptional heart care, we have created designated Provider Care Teams.  These Care Teams include your primary Cardiologist (physician) and Advanced Practice Providers (APPs -  Physician Assistants and Nurse Practitioners) who all work together to provide you with the care you need, when you need it.  We recommend signing up for the patient portal called "MyChart".  Sign up information is provided on this After Visit Summary.  MyChart is used to connect with patients for Virtual Visits (Telemedicine).  Patients are able to view lab/test results, encounter notes, upcoming appointments, etc.  Non-urgent messages can be sent to your provider as well.   To learn more about what you can do with MyChart, go to [email or web address].    Your next appointment:   6 month(s)  The format for your next appointment:   In Person  Provider:   You may see Dr Lovena Le or one of the following Advanced Practice Providers on your designated Care Team:   Tommye Standard, [place] Legrand Como "Jonni Sanger" Chalmers Cater, [place]   Other Instructions

## 2021-10-06 ENCOUNTER — Other Ambulatory Visit: Payer: Self-pay | Admitting: *Deleted

## 2021-10-06 DIAGNOSIS — C50912 Malignant neoplasm of unspecified site of left female breast: Secondary | ICD-10-CM | POA: Diagnosis not present

## 2021-10-06 MED ORDER — AMIODARONE HCL 200 MG PO TABS: 100.0000 mg | ORAL_TABLET | Freq: Every day | ORAL | 11 refills | Status: DC

## 2021-10-07 DIAGNOSIS — R922 Inconclusive mammogram: Secondary | ICD-10-CM | POA: Diagnosis not present

## 2021-10-07 DIAGNOSIS — R928 Other abnormal and inconclusive findings on diagnostic imaging of breast: Secondary | ICD-10-CM | POA: Diagnosis not present

## 2021-10-14 DIAGNOSIS — L821 Other seborrheic keratosis: Secondary | ICD-10-CM | POA: Diagnosis not present

## 2021-10-14 DIAGNOSIS — L812 Freckles: Secondary | ICD-10-CM | POA: Diagnosis not present

## 2021-10-14 DIAGNOSIS — Z85828 Personal history of other malignant neoplasm of skin: Secondary | ICD-10-CM | POA: Diagnosis not present

## 2021-10-14 DIAGNOSIS — L820 Inflamed seborrheic keratosis: Secondary | ICD-10-CM | POA: Diagnosis not present

## 2021-10-15 DIAGNOSIS — R946 Abnormal results of thyroid function studies: Secondary | ICD-10-CM | POA: Diagnosis not present

## 2021-10-21 ENCOUNTER — Other Ambulatory Visit: Payer: Self-pay

## 2021-10-21 DIAGNOSIS — C50112 Malignant neoplasm of central portion of left female breast: Secondary | ICD-10-CM | POA: Diagnosis not present

## 2021-10-21 DIAGNOSIS — C50912 Malignant neoplasm of unspecified site of left female breast: Secondary | ICD-10-CM | POA: Diagnosis not present

## 2021-10-22 DIAGNOSIS — R350 Frequency of micturition: Secondary | ICD-10-CM | POA: Diagnosis not present

## 2021-10-26 ENCOUNTER — Ambulatory Visit: Payer: Self-pay | Admitting: Surgery

## 2021-10-26 ENCOUNTER — Telehealth: Payer: Self-pay | Admitting: *Deleted

## 2021-10-26 DIAGNOSIS — C50412 Malignant neoplasm of upper-outer quadrant of left female breast: Secondary | ICD-10-CM | POA: Diagnosis not present

## 2021-10-26 NOTE — Telephone Encounter (Signed)
   Pre-operative Risk Assessment    Patient Name: [name]  DOB: 07/20/1933 MRN: [id number]      Request for Surgical Clearance   Procedure:   BREAST LUMPECTOMY  Date of Surgery: Clearance TBD                                 Surgeon:  DR. Erroll Luna Surgeon's Group or Practice Name:  [facility] Phone number:  [phone] Fax number:  [phone] ATTN: Carlene Coria, CMA    Type of Clearance Requested: - Medical  - Pharmacy:  Hold Apixaban (Eliquis)     Type of Anesthesia:   General    Additional requests/questions:   Jiles Prows   10/26/2021, 12:03 PM

## 2021-10-26 NOTE — Telephone Encounter (Signed)
Covering preop today. Will route to pharm then pt will need call. Last OV with EP APP 09/2021.

## 2021-10-27 ENCOUNTER — Telehealth: Payer: Self-pay | Admitting: Hematology and Oncology

## 2021-10-27 NOTE — Telephone Encounter (Signed)
Scheduled per sch msg. Called and spoke with patient. Confirmed appt  

## 2021-10-27 NOTE — Telephone Encounter (Signed)
Patient with diagnosis of atrial fibrillation on Eliquis for anticoagulation.    Procedure: breast lumpectomy Date of procedure: TBD   CHA2DS2-VASc Score = 5   This indicates a 7.2% annual risk of stroke. The patient's score is based upon: CHF History: 1 HTN History: 1 Diabetes History: 0 Stroke History: 0 Vascular Disease History: 0 Age Score: 2 Gender Score: 1    CrCl 46 (with adjusted body weight) Platelet count 211  Per office protocol, patient can hold Eliquis for 2 days prior to procedure.   Patient will not need bridging with Lovenox (enoxaparin) around procedure.

## 2021-10-28 ENCOUNTER — Other Ambulatory Visit: Payer: Self-pay | Admitting: Internal Medicine

## 2021-10-28 NOTE — Telephone Encounter (Signed)
   Primary Cardiologist: [name], MD  Chart reviewed as part of pre-operative protocol coverage. Given past medical history and time since last visit, based on ACC/AHA guidelines, [name] would be at acceptable risk for the planned procedure without further cardiovascular testing.   Patient with diagnosis of atrial fibrillation on Eliquis for anticoagulation.     Procedure: breast lumpectomy Date of procedure: TBD     CHA2DS2-VASc Score = 5   This indicates a 7.2% annual risk of stroke. The patient's score is based upon: CHF History: 1 HTN History: 1 Diabetes History: 0 Stroke History: 0 Vascular Disease History: 0 Age Score: 2 Gender Score: 1      CrCl 46 (with adjusted body weight) Platelet count 211   Per office protocol, patient can hold Eliquis for 2 days prior to procedure.   Patient will not need bridging with Lovenox (enoxaparin) around procedure.  I will route this recommendation to the requesting party via Epic fax function and remove from pre-op pool.  Please call with questions.  [name] Tajay Muzzy NP-C    10/28/2021, 8:25 AM [address] Office [phone] Fax [phone]

## 2021-10-28 NOTE — Progress Notes (Signed)
Patient Care Team: Lajean Manes, MD as PCP - General (Internal Medicine) Evans Lance, MD as PCP - Cardiology (Cardiology) Nicholas Lose, MD as Consulting Physician (Hematology and Oncology) Sylvan Cheese, NP as Nurse Practitioner (Nurse Practitioner) Evans Lance, MD as Consulting Physician (Cardiology) Brand Males, MD as Consulting Physician (Pulmonary Disease)  DIAGNOSIS:    ICD-10-CM   1. Malignant neoplasm of lower-outer quadrant of left breast of female, estrogen receptor positive ([place])  C50.512    Z17.0       SUMMARY OF ONCOLOGIC HISTORY: Oncology History  Breast cancer of lower-outer quadrant of left female breast ([place])  01/15/2015 Initial Biopsy   Left breast biopsy: Invasive lobular cancer, grade 2, ER+ (94%), PR+ (29%),  HER-2 negative, Ki-67 19%.   01/21/2015 Breast MRI   Left breast:: 1.7 x 1.6 x 1.4 cm irregular enhancing mass lower outer quadrant, no abnormal lymph nodes   01/21/2015 Clinical Stage   Stage IA: TI cN0 M0.   02/14/2015 Surgery   Left breast lumpectomy ([facility]): Invasive lobular cancer, negative for LV I, tumor size 01.7 cm, margins negative, HER-2 was repeated and it was negative   02/14/2015 Pathologic Stage   Stage IA: T1c N0   02/26/2015 Miscellaneous   Pt opted for no RT.   03/12/2015 Procedure   Genetic testing: OvaNext gene panel  ([facility]) revealed VUS at ATM. Otherwise negative BARD1, BRCA1, BRCA2, BRIP1, CDH1, CHEK2, EPCAM, MLH1, MRE11A, MSH2, MSH6, MUTYH, NBN, NF1, PALB2, PMS2, PTEN, RAD50, RAD51C, RAD51D, SMARCA4, STK11, TP53.   04/29/2015 - 02/09/2016 Anti-estrogen oral therapy   Anastrozole 1 mg daily changed to letrozole 2.5 mg daily 09/19/2015 due to dry mouth and loss of taste, stopped due to continuation of same side effects   08/22/2015 Survivorship   A copy of the survivorship care plan was mailed to the patient in lieu of an in-person visit at her request.   10/21/2021 Relapse/Recurrence   Screening  mammogram detected left breast abnormality, biopsy revealed grade 2 invasive lobular cancer ER 100%, PR less than 1%, HER2 equivocal FISH pending, Ki-67 10%     CHIEF COMPLIANT: Follow-up of left breast cancer  INTERVAL HISTORY: [name] is a 87 y.o. with above-mentioned history of left breast cancer treated with lumpectomy. She could not tolerate anti-estrogen therapy with letrozole or anastrozole and is currently on surveillance. I last saw her a year ago. Mammogram on 03/11/2020 showed no evidence of malignancy bilaterally. She presents to the clinic today for follow-up.   ALLERGIES:  is allergic to adhesive [tape], codeine, latex, and percodan [oxycodone-aspirin].  MEDICATIONS:  Current Outpatient Medications  Medication Sig Dispense Refill   amiodarone (PACERONE) 200 MG tablet Take 0.5 tablets (100 mg total) by mouth daily. 45 tablet 3   antiseptic oral rinse (BIOTENE) LIQD 15 mLs by Mouth Rinse route as needed for dry mouth.     Carboxymethylcellul-Glycerin (LUBRICATING EYE DROPS OP) Apply 1 drop to eye daily as needed (dry eyes).     Cyanocobalamin (B-12) 1000 MCG TABS Take 1 tablet by mouth daily.     ELIQUIS 5 MG TABS tablet TAKE 1 TABLET BY MOUTH  TWICE DAILY 180 tablet 2   hydrochlorothiazide (HYDRODIURIL) 25 MG tablet TAKE 1 TABLET ONCE DAILY. 90 tablet 2   HYDROcodone-acetaminophen (NORCO) 10-325 MG tablet Take 1 tablet by mouth 3 (three) times daily as needed for pain. (Patient not taking: Reported on 09/30/2021)     levothyroxine (SYNTHROID) 100 MCG tablet Take 100 mcg by mouth  daily before breakfast.     losartan (COZAAR) 50 MG tablet Take 1 tablet (50 mg total) by mouth daily. 90 tablet 3   metoprolol succinate (TOPROL-XL) 25 MG 24 hr tablet TAKE 1 TABLET ONCE DAILY. 30 tablet 6   MYRBETRIQ 50 MG TB24 tablet Take 50 mg by mouth daily. (Patient not taking: Reported on 09/30/2021)     raNITIdine HCl (WAL-ZAN 75 PO) Take 1 tablet by mouth daily as needed.  (Patient not  taking: Reported on 09/30/2021)     traMADol (ULTRAM) 50 MG tablet Take 50 mg by mouth 3 (three) times daily as needed. (Patient not taking: Reported on 09/30/2021)     triamcinolone cream (KENALOG) 0.1 % Apply 1 application topically daily as needed.  (Patient not taking: Reported on 09/30/2021)     Vitamin D, Ergocalciferol, (DRISDOL) 50000 units CAPS capsule Take 50,000 Units by mouth every 7 (seven) days.     No current facility-administered medications for this visit.    PHYSICAL EXAMINATION: ECOG PERFORMANCE STATUS: 1 - Symptomatic but completely ambulatory  Vitals:   10/29/21 1332  BP: (!) 145/78  Pulse: 82  Resp: 18  Temp: 97.8 F (36.6 C)  SpO2: 100%   Filed Weights   10/29/21 1332  Weight: 165 lb 9.6 oz (75.1 kg)    BREAST: No palpable masses or nodules in either right or left breasts. No palpable axillary supraclavicular or infraclavicular adenopathy no breast tenderness or nipple discharge. (exam performed in the presence of a chaperone)  LABORATORY DATA:  I have reviewed the data as listed CMP Latest Ref Rng & Units 09/30/2021 03/27/2021 09/01/2017  Glucose 70 - 99 mg/dL 76 151(H) 85  BUN 8 - 27 mg/dL 22 28(H) 18  Creatinine 0.57 - 1.00 mg/dL 0.92 0.91 0.94  Sodium 134 - 144 mmol/L 138 138 142  Potassium 3.5 - 5.2 mmol/L 4.4 4.0 3.7  Chloride 96 - 106 mmol/L 100 99 102  CO2 20 - 29 mmol/L $RemoveB'25 20 24  'lrwtzECH$ Calcium 8.7 - 10.3 mg/dL 9.0 9.1 9.3  Total Protein 6.0 - 8.5 g/dL 7.4 7.5 -  Total Bilirubin 0.0 - 1.2 mg/dL 0.4 0.4 -  Alkaline Phos 44 - 121 IU/L 111 106 -  AST 0 - 40 IU/L 27 18 -  ALT 0 - 32 IU/L 21 15 -    Lab Results  Component Value Date   WBC 5.9 09/30/2021   HGB 12.2 09/30/2021   HCT 36.9 09/30/2021   MCV 90 09/30/2021   PLT 211 09/30/2021   NEUTROABS 4.4 03/27/2021    ASSESSMENT & PLAN:  Breast cancer of lower-outer quadrant of left female breast Left breast biopsy: Invasive lobular cancer, grade 2, ER 94%, PR 29%, Ki-67 19%, HER-2 negative,  1.7 x 1.6 x 1.4 cm by MRI on 01/21/2015, clinical stage TI cN0 M0 stage IA. Left lumpectomy 02/14/2015: 1.7 cm invasive lobular cancer, grade 1, ER 94%, PA 29%, Ki-67 19%, HER-2 negative, T1 cN0 M0 stage IA pathologic staging Prior treatment: Anastrozole 1 mg daily started 04/27/2015 stopped August 2016 due to dry mouth and lack of taste; change in treatment to letrozole 2.5 mg daily from 09/21/2015 stopped in February 2017 -------------------------------------------------------------------------------------- Recurrence: 10/21/2021:Screening mammogram detected left breast abnormality, biopsy revealed grade 2 invasive lobular cancer ER 100%, PR less than 1%, HER2 equivocal FISH pending, Ki-67 10%  Treatment plan: Breast conserving surgery We will try antiestrogen therapy once again.  Previously she could not tolerate it.  We might try half a tablet daily  and see how she does with that.  Return to clinic after surgery to discuss pathology report and start antiestrogen therapy.   No orders of the defined types were placed in this encounter.  The patient has a good understanding of the overall plan. she agrees with it. she will call with any problems that may develop before the next visit here.  Total time spent: 20 mins including face to face time and time spent for planning, charting and coordination of care  Rulon Eisenmenger, MD, MPH 10/29/2021  I, Thana Ates, am acting as scribe for Dr. Nicholas Lose.  I have reviewed the above documentation for accuracy and completeness, and I agree with the above.

## 2021-10-29 ENCOUNTER — Inpatient Hospital Stay: Payer: Medicare Other | Attending: Hematology and Oncology | Admitting: Hematology and Oncology

## 2021-10-29 ENCOUNTER — Ambulatory Visit: Payer: Self-pay | Admitting: Surgery

## 2021-10-29 ENCOUNTER — Other Ambulatory Visit: Payer: Self-pay

## 2021-10-29 DIAGNOSIS — Z170 Estrogen receptor positive status [ER+]: Secondary | ICD-10-CM | POA: Diagnosis not present

## 2021-10-29 DIAGNOSIS — Z79811 Long term (current) use of aromatase inhibitors: Secondary | ICD-10-CM | POA: Diagnosis not present

## 2021-10-29 DIAGNOSIS — C50512 Malignant neoplasm of lower-outer quadrant of left female breast: Secondary | ICD-10-CM

## 2021-10-29 NOTE — Assessment & Plan Note (Signed)
Left breast biopsy: Invasive lobular cancer, grade 2, ER 94%, PR 29%, Ki-67 19%, HER-2 negative, 1.7 x 1.6 x 1.4 cm by MRI on 01/21/2015, clinical stage TI cN0 M0 stage IA. Left lumpectomy 02/14/2015: 1.7 cm invasive lobular cancer, grade 1, ER 94%, PA 29%, Ki-67 19%, HER-2 negative, T1 cN0 M0 stage IA pathologic staging Current treatment: Anastrozole 1 mg daily started 04/27/2015 stopped August 2016 due to dry mouth and lack of taste; change in treatment to letrozole 2.5 mg daily from 10/09/2016stopped in February 2017  Breast Cancer Surveillance: 1. Breast exam8/27/2019is without any clinical concerns for disease recurrence 2. Mammograms: 02/27/2019 at [facility]: Benign  Patient has 2 daughters with breast cancer. She tells me that they were tested for genetics and were negative. CT chest 02/10/2018 for interstitial lung disease: Scattered areas of pulmonary fibrosis, cardiomegaly (pace maker)  Patient sees Dr.Stoneking. Return to clinic on an as needed basis 

## 2021-11-10 ENCOUNTER — Encounter: Payer: Self-pay | Admitting: *Deleted

## 2021-11-11 ENCOUNTER — Other Ambulatory Visit: Payer: Self-pay

## 2021-11-11 ENCOUNTER — Encounter (HOSPITAL_BASED_OUTPATIENT_CLINIC_OR_DEPARTMENT_OTHER): Payer: Self-pay | Admitting: Surgery

## 2021-11-11 ENCOUNTER — Encounter: Payer: Self-pay | Admitting: Internal Medicine

## 2021-11-11 NOTE — Progress Notes (Unsigned)
PERIOPERATIVE PRESCRIPTION FOR IMPLANTED CARDIAC DEVICE PROGRAMMING  Patient Information: Name:  [name]  DOB:  05-28-1933  MRN:  [id number]  {TIP - You do not have to delete this tip  -  Copy the info from the staff message sent by the PAT staff  then press F2 here and paste the information using CTL - V on the next line :[id number]}  Planned Procedure:  Left breast lumpectomy  Surgeon:  Dr Brantley Stage  Date of Procedure:  11-18-2021  Cautery will be used.  Position during surgery:  supine   Please send documentation back to:  [facility] (Fax # [phone])  Device Information:  Clinic EP Physician:  [name], MD   Device Type:  Pacemaker Manufacturer and Phone #:  Medtronic: [id number] Pacemaker Dependent?:  Yes.   Date of Last Device Check:  09/30/21 Normal Device Function?:  Yes.    Electrophysiologist's Recommendations:  Have magnet available. Provide continuous ECG monitoring when magnet is used or reprogramming is to be performed.  Procedure will likely interfere with device function.  Device should be programmed:  Asynchronous pacing during procedure and returned to normal programming after procedure  Per Device Clinic Standing Orders, Wanda Plump, RN  8:40 AM 11/11/2021

## 2021-11-12 ENCOUNTER — Encounter (HOSPITAL_BASED_OUTPATIENT_CLINIC_OR_DEPARTMENT_OTHER)
Admission: RE | Admit: 2021-11-12 | Discharge: 2021-11-12 | Disposition: A | Discharge status: Home / Self Care | Payer: Medicare Other | Source: Ambulatory Visit | Attending: Surgery | Admitting: Surgery

## 2021-11-12 DIAGNOSIS — I10 Essential (primary) hypertension: Secondary | ICD-10-CM | POA: Diagnosis not present

## 2021-11-12 DIAGNOSIS — Z01812 Encounter for preprocedural laboratory examination: Secondary | ICD-10-CM | POA: Diagnosis not present

## 2021-11-12 LAB — BASIC METABOLIC PANEL
Anion gap: 8 (ref 5–15)
BUN: 24 mg/dL — ABNORMAL HIGH (ref 8–23)
CO2: 26 mmol/L (ref 22–32)
Calcium: 8.6 mg/dL — ABNORMAL LOW (ref 8.9–10.3)
Chloride: 99 mmol/L (ref 98–111)
Creatinine, Ser: 1.04 mg/dL — ABNORMAL HIGH (ref 0.44–1.00)
GFR, Estimated: 52 mL/min — ABNORMAL LOW (ref >60)
Glucose, Bld: 89 mg/dL (ref 70–99)
Potassium: 3.7 mmol/L (ref 3.5–5.1)
Sodium: 133 mmol/L — ABNORMAL LOW (ref 135–145)

## 2021-11-12 NOTE — Progress Notes (Signed)

## 2021-11-16 DIAGNOSIS — C50812 Malignant neoplasm of overlapping sites of left female breast: Secondary | ICD-10-CM | POA: Diagnosis not present

## 2021-11-17 DIAGNOSIS — E039 Hypothyroidism, unspecified: Secondary | ICD-10-CM | POA: Diagnosis not present

## 2021-11-18 ENCOUNTER — Encounter (HOSPITAL_BASED_OUTPATIENT_CLINIC_OR_DEPARTMENT_OTHER): Payer: Self-pay | Admitting: Surgery

## 2021-11-18 ENCOUNTER — Encounter (HOSPITAL_BASED_OUTPATIENT_CLINIC_OR_DEPARTMENT_OTHER)
Admission: RE | Disposition: A | Discharge status: Home / Self Care | Payer: Self-pay | Source: Ambulatory Visit | Attending: Surgery

## 2021-11-18 ENCOUNTER — Ambulatory Visit (HOSPITAL_BASED_OUTPATIENT_CLINIC_OR_DEPARTMENT_OTHER)
Admission: RE | Admit: 2021-11-18 | Discharge: 2021-11-18 | Disposition: A | Discharge status: Home / Self Care | Payer: Medicare Other | Source: Ambulatory Visit | Attending: Surgery | Admitting: Surgery

## 2021-11-18 ENCOUNTER — Ambulatory Visit (HOSPITAL_BASED_OUTPATIENT_CLINIC_OR_DEPARTMENT_OTHER): Payer: Medicare Other | Admitting: Anesthesiology

## 2021-11-18 DIAGNOSIS — I110 Hypertensive heart disease with heart failure: Secondary | ICD-10-CM | POA: Diagnosis not present

## 2021-11-18 DIAGNOSIS — N319 Neuromuscular dysfunction of bladder, unspecified: Secondary | ICD-10-CM | POA: Diagnosis not present

## 2021-11-18 DIAGNOSIS — R519 Headache, unspecified: Secondary | ICD-10-CM | POA: Diagnosis not present

## 2021-11-18 DIAGNOSIS — M1990 Unspecified osteoarthritis, unspecified site: Secondary | ICD-10-CM | POA: Insufficient documentation

## 2021-11-18 DIAGNOSIS — Z170 Estrogen receptor positive status [ER+]: Secondary | ICD-10-CM | POA: Insufficient documentation

## 2021-11-18 DIAGNOSIS — C50512 Malignant neoplasm of lower-outer quadrant of left female breast: Secondary | ICD-10-CM | POA: Diagnosis not present

## 2021-11-18 DIAGNOSIS — I10 Essential (primary) hypertension: Secondary | ICD-10-CM

## 2021-11-18 DIAGNOSIS — I509 Heart failure, unspecified: Secondary | ICD-10-CM | POA: Insufficient documentation

## 2021-11-18 DIAGNOSIS — I4891 Unspecified atrial fibrillation: Secondary | ICD-10-CM | POA: Diagnosis not present

## 2021-11-18 DIAGNOSIS — C50812 Malignant neoplasm of overlapping sites of left female breast: Secondary | ICD-10-CM | POA: Diagnosis not present

## 2021-11-18 DIAGNOSIS — I471 Supraventricular tachycardia: Secondary | ICD-10-CM | POA: Insufficient documentation

## 2021-11-18 DIAGNOSIS — E039 Hypothyroidism, unspecified: Secondary | ICD-10-CM | POA: Insufficient documentation

## 2021-11-18 DIAGNOSIS — Z950 Presence of cardiac pacemaker: Secondary | ICD-10-CM | POA: Diagnosis not present

## 2021-11-18 DIAGNOSIS — C50912 Malignant neoplasm of unspecified site of left female breast: Secondary | ICD-10-CM | POA: Diagnosis not present

## 2021-11-18 DIAGNOSIS — I482 Chronic atrial fibrillation, unspecified: Secondary | ICD-10-CM | POA: Diagnosis not present

## 2021-11-18 HISTORY — DX: Presence of cardiac pacemaker: Z95.0

## 2021-11-18 HISTORY — PX: BREAST LUMPECTOMY WITH RADIOACTIVE SEED LOCALIZATION: SHX6424

## 2021-11-18 HISTORY — DX: Hypothyroidism, unspecified: E03.9

## 2021-11-18 HISTORY — DX: Overactive bladder: N32.81

## 2021-11-18 HISTORY — DX: Unspecified osteoarthritis, unspecified site: M19.90

## 2021-11-18 SURGERY — BREAST LUMPECTOMY WITH RADIOACTIVE SEED LOCALIZATION
Anesthesia: General | Site: Breast | Laterality: Left

## 2021-11-18 MED ORDER — CHLORHEXIDINE GLUCONATE CLOTH 2 % EX PADS: 6.0000 | MEDICATED_PAD | Freq: Once | CUTANEOUS | Status: DC

## 2021-11-18 MED ORDER — PHENYLEPHRINE 40 MCG/ML (10ML) SYRINGE FOR IV PUSH (FOR BLOOD PRESSURE SUPPORT)
PREFILLED_SYRINGE | INTRAVENOUS | Status: AC
  Filled 2021-11-18: qty 10

## 2021-11-18 MED ORDER — SODIUM CHLORIDE 0.9 % IV SOLN
INTRAVENOUS | Status: DC | PRN
  Administered 2021-11-18: 500 mL

## 2021-11-18 MED ORDER — ACETAMINOPHEN 500 MG PO TABS
ORAL_TABLET | ORAL | Status: AC
  Filled 2021-11-18: qty 2

## 2021-11-18 MED ORDER — BUPIVACAINE-EPINEPHRINE (PF) 0.25% -1:200000 IJ SOLN
INTRAMUSCULAR | Status: AC
  Filled 2021-11-18: qty 30

## 2021-11-18 MED ORDER — ONDANSETRON HCL 4 MG/2ML IJ SOLN: 4.0000 mg | Freq: Once | INTRAMUSCULAR | Status: DC | PRN

## 2021-11-18 MED ORDER — ONDANSETRON HCL 4 MG/2ML IJ SOLN
INTRAMUSCULAR | Status: DC | PRN
  Administered 2021-11-18: 4 mg via INTRAVENOUS

## 2021-11-18 MED ORDER — ACETAMINOPHEN 500 MG PO TABS
1000.0000 mg | ORAL_TABLET | ORAL | Status: AC
  Administered 2021-11-18: 1000 mg via ORAL

## 2021-11-18 MED ORDER — LIDOCAINE 2% (20 MG/ML) 5 ML SYRINGE
INTRAMUSCULAR | Status: AC
  Filled 2021-11-18: qty 5

## 2021-11-18 MED ORDER — FENTANYL CITRATE (PF) 100 MCG/2ML IJ SOLN
INTRAMUSCULAR | Status: AC
  Filled 2021-11-18: qty 2

## 2021-11-18 MED ORDER — PROPOFOL 10 MG/ML IV BOLUS
INTRAVENOUS | Status: DC | PRN
  Administered 2021-11-18: 20 mg via INTRAVENOUS
  Administered 2021-11-18: 100 mg via INTRAVENOUS
  Administered 2021-11-18: 20 mg via INTRAVENOUS

## 2021-11-18 MED ORDER — DEXAMETHASONE SODIUM PHOSPHATE 10 MG/ML IJ SOLN
INTRAMUSCULAR | Status: DC | PRN
  Administered 2021-11-18: 5 mg via INTRAVENOUS

## 2021-11-18 MED ORDER — EPHEDRINE SULFATE 50 MG/ML IJ SOLN
INTRAMUSCULAR | Status: DC | PRN
Start: 2021-11-18 — End: 2021-11-18
  Administered 2021-11-18 (×2): 5 mg via INTRAVENOUS

## 2021-11-18 MED ORDER — CEFAZOLIN SODIUM-DEXTROSE 2-4 GM/100ML-% IV SOLN
INTRAVENOUS | Status: AC
  Filled 2021-11-18: qty 100

## 2021-11-18 MED ORDER — BUPIVACAINE-EPINEPHRINE (PF) 0.25% -1:200000 IJ SOLN
INTRAMUSCULAR | Status: DC | PRN
  Administered 2021-11-18: 20 mL

## 2021-11-18 MED ORDER — FENTANYL CITRATE (PF) 100 MCG/2ML IJ SOLN
INTRAMUSCULAR | Status: DC | PRN
  Administered 2021-11-18: 50 ug via INTRAVENOUS

## 2021-11-18 MED ORDER — TRAMADOL HCL 50 MG PO TABS: 50.0000 mg | ORAL_TABLET | Freq: Four times a day (QID) | ORAL | 0 refills | Status: DC | PRN

## 2021-11-18 MED ORDER — CEFAZOLIN SODIUM-DEXTROSE 2-4 GM/100ML-% IV SOLN
2.0000 g | INTRAVENOUS | Status: AC
  Administered 2021-11-18: 2 g via INTRAVENOUS

## 2021-11-18 MED ORDER — LACTATED RINGERS IV SOLN: INTRAVENOUS | Status: DC

## 2021-11-18 MED ORDER — ONDANSETRON HCL 4 MG/2ML IJ SOLN
INTRAMUSCULAR | Status: AC
  Filled 2021-11-18: qty 2

## 2021-11-18 MED ORDER — SODIUM CHLORIDE 0.9 % IV SOLN
INTRAVENOUS | Status: AC
  Filled 2021-11-18: qty 10

## 2021-11-18 MED ORDER — DEXAMETHASONE SODIUM PHOSPHATE 10 MG/ML IJ SOLN
INTRAMUSCULAR | Status: AC
  Filled 2021-11-18: qty 1

## 2021-11-18 MED ORDER — PROPOFOL 10 MG/ML IV BOLUS
INTRAVENOUS | Status: AC
  Filled 2021-11-18: qty 20

## 2021-11-18 MED ORDER — EPHEDRINE 5 MG/ML INJ
INTRAVENOUS | Status: AC
  Filled 2021-11-18: qty 10

## 2021-11-18 MED ORDER — PHENYLEPHRINE HCL (PRESSORS) 10 MG/ML IV SOLN
INTRAVENOUS | Status: DC | PRN
Start: 2021-11-18 — End: 2021-11-18
  Administered 2021-11-18: 80 ug via INTRAVENOUS
  Administered 2021-11-18: 40 ug via INTRAVENOUS
  Administered 2021-11-18: 80 ug via INTRAVENOUS
  Administered 2021-11-18 (×2): 40 ug via INTRAVENOUS

## 2021-11-18 MED ORDER — LIDOCAINE HCL (CARDIAC) PF 100 MG/5ML IV SOSY
PREFILLED_SYRINGE | INTRAVENOUS | Status: DC | PRN
Start: 2021-11-18 — End: 2021-11-18
  Administered 2021-11-18: 80 mg via INTRAVENOUS

## 2021-11-18 MED ORDER — FENTANYL CITRATE (PF) 100 MCG/2ML IJ SOLN: 25.0000 ug | INTRAMUSCULAR | Status: DC | PRN

## 2021-11-18 SURGICAL SUPPLY — 51 items
ADH SKN CLS APL DERMABOND .7 (GAUZE/BANDAGES/DRESSINGS) ×1
APL PRP STRL LF DISP 70% ISPRP (MISCELLANEOUS) ×1
APPLIER CLIP 9.375 MED OPEN (MISCELLANEOUS) ×2
APR CLP MED 9.3 20 MLT OPN (MISCELLANEOUS) ×1
BINDER BREAST LRG (GAUZE/BANDAGES/DRESSINGS) IMPLANT
BINDER BREAST MEDIUM (GAUZE/BANDAGES/DRESSINGS) IMPLANT
BINDER BREAST XLRG (GAUZE/BANDAGES/DRESSINGS) ×2 IMPLANT
BINDER BREAST XXLRG (GAUZE/BANDAGES/DRESSINGS) IMPLANT
BLADE SURG 15 STRL LF DISP TIS (BLADE) ×1 IMPLANT
BLADE SURG 15 STRL SS (BLADE) ×2
CANISTER SUC SOCK COL 7IN (MISCELLANEOUS) IMPLANT
CANISTER SUCT 1200ML W/VALVE (MISCELLANEOUS) ×2 IMPLANT
CHLORAPREP W/TINT 26 (MISCELLANEOUS) ×2 IMPLANT
CLIP APPLIE 9.375 MED OPEN (MISCELLANEOUS) ×1 IMPLANT
COVER BACK TABLE 60X90IN (DRAPES) ×2 IMPLANT
COVER MAYO STAND STRL (DRAPES) ×2 IMPLANT
COVER PROBE W GEL 5X96 (DRAPES) ×2 IMPLANT
DECANTER SPIKE VIAL GLASS SM (MISCELLANEOUS) IMPLANT
DERMABOND ADVANCED (GAUZE/BANDAGES/DRESSINGS) ×1
DERMABOND ADVANCED .7 DNX12 (GAUZE/BANDAGES/DRESSINGS) ×1 IMPLANT
DRAPE LAPAROSCOPIC ABDOMINAL (DRAPES) IMPLANT
DRAPE LAPAROTOMY 100X72 PEDS (DRAPES) ×2 IMPLANT
DRAPE UTILITY XL STRL (DRAPES) ×2 IMPLANT
ELECT COATED BLADE 2.86 ST (ELECTRODE) ×2 IMPLANT
ELECT REM PT RETURN 9FT ADLT (ELECTROSURGICAL) ×2
ELECTRODE REM PT RTRN 9FT ADLT (ELECTROSURGICAL) ×1 IMPLANT
GLOVE SRG 8 PF TXTR STRL LF DI (GLOVE) ×1 IMPLANT
GLOVE SURG LTX SZ8 (GLOVE) IMPLANT
GLOVE SURG POLYISO LF SZ8 (GLOVE) ×2 IMPLANT
GLOVE SURG UNDER POLY LF SZ8 (GLOVE) ×2
GOWN STRL REUS W/ TWL LRG LVL3 (GOWN DISPOSABLE) ×2 IMPLANT
GOWN STRL REUS W/ TWL XL LVL3 (GOWN DISPOSABLE) ×1 IMPLANT
GOWN STRL REUS W/TWL LRG LVL3 (GOWN DISPOSABLE) ×4
GOWN STRL REUS W/TWL XL LVL3 (GOWN DISPOSABLE) ×2
HEMOSTAT ARISTA ABSORB 3G PWDR (HEMOSTASIS) ×2 IMPLANT
HEMOSTAT SNOW SURGICEL 2X4 (HEMOSTASIS) IMPLANT
KIT MARKER MARGIN INK (KITS) ×2 IMPLANT
NEEDLE HYPO 25X1 1.5 SAFETY (NEEDLE) ×2 IMPLANT
NS IRRIG 1000ML POUR BTL (IV SOLUTION) IMPLANT
PACK BASIN DAY SURGERY FS (CUSTOM PROCEDURE TRAY) ×2 IMPLANT
PENCIL SMOKE EVACUATOR (MISCELLANEOUS) ×2 IMPLANT
SLEEVE SCD COMPRESS KNEE MED (STOCKING) ×2 IMPLANT
SPONGE T-LAP 4X18 ~~LOC~~+RFID (SPONGE) ×2 IMPLANT
SUT MNCRL AB 4-0 PS2 18 (SUTURE) ×2 IMPLANT
SUT SILK 2 0 SH (SUTURE) IMPLANT
SUT VICRYL 3-0 CR8 SH (SUTURE) ×2 IMPLANT
SYR CONTROL 10ML LL (SYRINGE) ×2 IMPLANT
TOWEL GREEN STERILE FF (TOWEL DISPOSABLE) ×2 IMPLANT
TRAY FAXITRON CT DISP (TRAY / TRAY PROCEDURE) ×2 IMPLANT
TUBE CONNECTING 20X1/4 (TUBING) ×2 IMPLANT
YANKAUER SUCT BULB TIP NO VENT (SUCTIONS) ×2 IMPLANT

## 2021-11-18 NOTE — Transfer of Care (Signed)
Immediate Anesthesia Transfer of Care Note  Patient: [name]  Procedure(s) Performed: LEFT BREAST LUMPECTOMY WITH RADIOACTIVE SEED LOCALIZATION (Left: Breast)  Patient Location: PACU  Anesthesia Type:General  Level of Consciousness: drowsy  Airway & Oxygen Therapy: Patient Spontanous Breathing and Patient connected to face mask oxygen  Post-op Assessment: Report given to RN and Post -op Vital signs reviewed and stable  Post vital signs: Reviewed and stable  Last Vitals:  Vitals Value Taken Time  BP 141/84 11/18/21 1645  Temp    Pulse 80 11/18/21 1647  Resp 15 11/18/21 1647  SpO2 94 % 11/18/21 1647  Vitals shown include unvalidated device data.  Last Pain:  Vitals:   11/18/21 1305  TempSrc: Oral  PainSc: 0-No pain         Complications: No notable events documented.

## 2021-11-18 NOTE — H&P (Signed)
History of Present Illness: [name] is a 87 y.o. female who is seen today as an office consultation at the request of Dr. Truman Hayward for evaluation of No chief complaint on file. .   Patient presents for evaluation of left breast cancer. She has a past history of stage I left breast cancer which was a lobular subtype both ER and PR positive treated in 2016 with breast conserving surgery. She did not undergo radiation therapy at that time. No lymph nodes were removed. She returns for follow-up mammogram and she had a 1.1 cm mass left breast upper outer quadrant core biopsy proven to be invasive mammary carcinoma. Final pathology showed invasive lobular carcinoma ER positive PR negative HER2/neu negative with KI of 10%. She has no other complaints today except for some bruising at the biopsy site.  Review of Systems: A complete review of systems was obtained from the patient. I have reviewed this information and discussed as appropriate with the patient. See HPI as well for other ROS.    Medical History: Past Medical History:  Diagnosis Date   Anxiety   Arthritis   History of cancer   There is no problem list on file for this patient.  Past Surgical History:  Procedure Laterality Date   HYSTERECTOMY    Allergies  Allergen Reactions   Adhesive Rash  Including EKG electrodes and the like.   Codeine Other (See Comments)  unknown unknown unknown unknown   Latex Itching   Sulfa (Sulfonamide Antibiotics) Other (See Comments)   Current Outpatient Medications on File Prior to Visit  Medication Sig Dispense Refill   AMIOdarone (PACERONE) 200 MG tablet 1 tablet (200 mg total)   apixaban (ELIQUIS) 5 mg tablet Eliquis 5 mg tablet Take 1 tablet twice a day by oral route.   cyanocobalamin (VITAMIN B12) 1000 MCG tablet Take 1 tablet (1,000 mcg total) by mouth once daily   ergocalciferol, vitamin D2, (VITAMIN D2 ORAL) Vitamin D   hydroCHLOROthiazide (HYDRODIURIL) 25 MG tablet Take 1 tablet  (25 mg total) by mouth once daily   HYDROcodone-acetaminophen (NORCO) 10-325 mg tablet every 4 (four) hours   levothyroxine (SYNTHROID) 100 MCG tablet Take 1 tablet (100 mcg total) by mouth once daily   losartan (COZAAR) 100 MG tablet   metoprolol succinate (TOPROL-XL) 25 MG XL tablet Take 1 tablet (25 mg total) by mouth once daily   mirabegron (MYRBETRIQ) 50 mg ER tablet 1 tablet (50 mg total)   oxybutynin (DITROPAN-XL) 10 MG XL tablet Take 1 tablet (10 mg total) by mouth once daily   ranitidine (ZANTAC) 75 MG tablet Wal-Zan 75   traMADoL (ULTRAM) 50 mg tablet Take by mouth   No current facility-administered medications on file prior to visit.   Family History  Problem Relation Age of Onset   High blood pressure (Hypertension) Mother   Breast cancer Sister    Social History   Tobacco Use  Smoking Status Never  Smokeless Tobacco Never    Social History   Socioeconomic History   Marital status: Married  Tobacco Use   Smoking status: Never   Smokeless tobacco: Never   Objective:   Vitals:  10/26/21 1046  BP: 116/74  Pulse: 86  Weight: 74.5 kg (164 lb 3.2 oz)  Height: 175.3 cm ($RemoveB'5\' 9"'IdaPLPwh$ )   Body mass index is 24.25 kg/m.  Physical Exam Constitutional:  Appearance: Normal appearance.  Eyes:  General: No scleral icterus. Pupils: Pupils are equal, round, and reactive to light.  Cardiovascular:  Rate and  Rhythm: Normal rate.  Pulmonary:  Effort: Pulmonary effort is normal.  Breath sounds: No stridor.  Chest:  Breasts: Right: Normal. No mass.   Musculoskeletal:  Cervical back: Normal range of motion and neck supple.  Lymphadenopathy:  Upper Body:  Right upper body: No supraclavicular or axillary adenopathy.  Left upper body: No supraclavicular or axillary adenopathy.  Skin: General: Skin is warm and dry.  Neurological:  General: No focal deficit present.  Mental Status: She is alert.  Psychiatric:  Mood and Affect: Mood normal.     Labs, Imaging and  Diagnostic Testing: ADDITIONAL INFORMATION: E-cadherin is NEGATIVE supporting lobular origin. Thressa Sheller MD Pathologist, Electronic Signature ( Signed 10/26/2021) PROGNOSTIC INDICATORS Results: IMMUNOHISTOCHEMICAL AND MORPHOMETRIC ANALYSIS PERFORMED MANUALLY The tumor cells are EQUIVOCAL for Her2 (2+). Her2 by FISH will be performed and results reported separately. Estrogen Receptor: 100%, POSITIVE, STRONG STAINING INTENSITY Progesterone Receptor: <1%, NEGATIVE Proliferation Marker Ki67: 10% COMMENT: The negative hormone receptor study(ies) in this case has an internal positive control. REFERENCE RANGE ESTROGEN RECEPTOR NEGATIVE 0% POSITIVE =>1% REFERENCE RANGE PROGESTERONE RECEPTOR NEGATIVE 0% POSITIVE =>1% All controls stained appropriately Thressa Sheller MD Pathologist, Electronic Signature ( Signed 10/26/2021  Assessment and Plan:  Diagnoses and all orders for this visit:  Malignant neoplasm of upper-outer quadrant of left female breast, unspecified estrogen receptor status (CMS-HCC) - Ambulatory Referral to Oncology-Medical    Reviewed records from 2016. Patient underwent a left seed localized lumpectomy for stage I left breast cancer which was a lobular subtype. She received 5 years of antiestrogen therapy but did not tolerate it well. She had no radiation therapy.  Certainly given her advanced age antiestrogen therapy could be another option with surgery or without surgery. Overall, her health is good therefore recommend left breast lumpectomy. Certainly if there is a strong consensus about radiation therapy she would possibly require mastectomy or have her pacemaker relocated for breast conserving measures. I do not recommend a sentinel lymph node in this case unless oncology needs due to advanced age and overall type of cancer. There is no survival benefit for her and given the fact that she has a pacemaker on that side as well as lymphedema risk, I do not think it will  change her prognosis. She will see medical oncology/radiation oncology and then we can certainly reconvene. However goiter scheduled for left breast seed lumpectomy.The procedure has been discussed with the patient. Alternatives to surgery have been discussed with the patient. Risks of surgery include bleeding, Infection, Seroma formation, death, and the need for further surgery. The patient understands and wishes to proceed.

## 2021-11-18 NOTE — Anesthesia Procedure Notes (Signed)
Procedure Name: LMA Insertion Date/Time: 11/18/2021 3:38 PM Performed by: Lavonia Dana, CRNA Pre-anesthesia Checklist: Patient identified, Emergency Drugs available, Suction available and Patient being monitored Patient Re-evaluated:Patient Re-evaluated prior to induction Oxygen Delivery Method: Circle system utilized Preoxygenation: Pre-oxygenation with 100% oxygen Induction Type: IV induction Ventilation: Mask ventilation without difficulty LMA: LMA inserted LMA Size: 4.0 Number of attempts: 1 Airway Equipment and Method: Bite block Placement Confirmation: positive ETCO2 Tube secured with: Tape Dental Injury: Teeth and Oropharynx as per pre-operative assessment

## 2021-11-18 NOTE — Anesthesia Preprocedure Evaluation (Addendum)
Anesthesia Evaluation  Patient identified by MRN, date of birth, ID band Patient awake    Reviewed: Allergy & Precautions, NPO status , Patient's Chart, lab work & pertinent test results, reviewed documented beta blocker date and time   Airway Mallampati: I  TM Distance: >3 FB Neck ROM: Full    Dental  (+) Upper Dentures, Lower Dentures   Pulmonary neg pulmonary ROS,    Pulmonary exam normal breath sounds clear to auscultation       Cardiovascular hypertension, Pt. on medications and Pt. on home beta blockers +CHF  Normal cardiovascular exam+ dysrhythmias Atrial Fibrillation and Supra Ventricular Tachycardia + pacemaker  Rhythm:Regular Rate:Normal  EKG 09/30/21 Ventricular paced rhythm  Echo 08/11/17 Left ventricle: The cavity size was normal. Systolic function was normal. The estimated ejection fraction was in the range of 55%to 60%. Wall motion was normal; there were no regional wall motion abnormalities. Features are consistent with a pseudonormal left ventricular filling pattern, with concomitant abnormal  relaxation and increased filling pressure (grade 2 diastolic  dysfunction). Doppler parameters are consistent with high ventricular filling pressure.  - Aortic valve: There was no regurgitation.  - Mitral valve: Transvalvular velocity was within the normal range. There was no evidence for stenosis. There was trivialregurgitation.  - Left atrium: The atrium was severely dilated.  - Right ventricle: The cavity size was normal. Wall thickness was normal. Systolic function was normal.  - Tricuspid valve: There was no regurgitation.  - Pericardium, extracardiac: There was a left pleural effusion.    Neuro/Psych  Headaches, negative psych ROS   GI/Hepatic negative GI ROS, Neg liver ROS,   Endo/Other  Hypothyroidism Left breast Ca  Renal/GU negative Renal ROS Bladder dysfunction  OAB    Musculoskeletal  (+) Arthritis  , Osteoarthritis,    Abdominal   Peds  Hematology Eliquis therapy- last dose 2 days ago    Anesthesia Other Findings   Reproductive/Obstetrics                          Anesthesia Physical Anesthesia Plan  ASA: 3  Anesthesia Plan: General   Post-op Pain Management:    Induction: Intravenous  PONV Risk Score and Plan: 4 or greater  Airway Management Planned: LMA  Additional Equipment: None  Intra-op Plan:   Post-operative Plan: Extubation in OR  Informed Consent: I have reviewed the patients History and Physical, chart, labs and discussed the procedure including the risks, benefits and alternatives for the proposed anesthesia with the patient or authorized representative who has indicated his/her understanding and acceptance.     Dental advisory given  Plan Discussed with: CRNA and Anesthesiologist  Anesthesia Plan Comments:         Anesthesia Quick Evaluation

## 2021-11-18 NOTE — Discharge Instructions (Addendum)
[place] Office Phone Number [phone]  BREAST BIOPSY/ PARTIAL MASTECTOMY: POST OP INSTRUCTIONS  Always review your discharge instruction sheet given to you by the facility where your surgery was performed.  IF YOU HAVE DISABILITY OR FAMILY LEAVE FORMS, YOU MUST BRING THEM TO THE OFFICE FOR PROCESSING.  DO NOT GIVE THEM TO YOUR DOCTOR.  A prescription for pain medication may be given to you upon discharge.  Take your pain medication as prescribed, if needed.  If narcotic pain medicine is not needed, then you may take acetaminophen (Tylenol) or ibuprofen (Advil) as needed. Take your usually prescribed medications unless otherwise directed If you need a refill on your pain medication, please contact your pharmacy.  They will contact our office to request authorization.  Prescriptions will not be filled after 5pm or on week-ends. You should eat very light the first 24 hours after surgery, such as soup, crackers, pudding, etc.  Resume your normal diet the day after surgery. Most patients will experience some swelling and bruising in the breast.  Ice packs and a good support bra will help.  Swelling and bruising can take several days to resolve.  It is common to experience some constipation if taking pain medication after surgery.  Increasing fluid intake and taking a stool softener will usually help or prevent this problem from occurring.  A mild laxative (Milk of Magnesia or Miralax) should be taken according to package directions if there are no bowel movements after 48 hours. Unless discharge instructions indicate otherwise, you may remove your bandages 24-48 hours after surgery, and you may shower at that time.  You may have steri-strips (small skin tapes) in place directly over the incision.  These strips should be left on the skin for 7-10 days.  If your surgeon used skin glue on the incision, you may shower in 24 hours.  The glue will flake off over the next 2-3 weeks.  Any  sutures or staples will be removed at the office during your follow-up visit. ACTIVITIES:  You may resume regular daily activities (gradually increasing) beginning the next day.  Wearing a good support bra or sports bra minimizes pain and swelling.  You may have sexual intercourse when it is comfortable. You may drive when you no longer are taking prescription pain medication, you can comfortably wear a seatbelt, and you can safely maneuver your car and apply brakes. RETURN TO WORK:  ______________________________________________________________________________________ Dennis Bast should see your doctor in the office for a follow-up appointment approximately two weeks after your surgery.  Your doctor's nurse will typically make your follow-up appointment when she calls you with your pathology report.  Expect your pathology report 2-3 business days after your surgery.  You may call to check if you do not hear from [place] after three days. OTHER INSTRUCTIONS: _______________________________________________________________________________________________ _____________________________________________________________________________________________________________________________________ _____________________________________________________________________________________________________________________________________ _____________________________________________________________________________________________________________________________________  WHEN TO CALL YOUR DOCTOR: Fever over 101.0 Nausea and/or vomiting. Extreme swelling or bruising. Continued bleeding from incision. Increased pain, redness, or drainage from the incision.  The clinic staff is available to answer your questions during regular business hours.  Please don't hesitate to call and ask to speak to one of the nurses for clinical concerns.  If you have a medical emergency, go to the nearest emergency room or call 911.  A surgeon from [facility] is always on call at the hospital.  For further questions, please visit [email or web address]    **You had 1000 mg of Tylenol at 1:00 PM   [place]  Instructions  Activity: Get plenty of rest for the remainder of the day. A responsible individual must stay with you for 24 hours following the procedure.  For the next 24 hours, DO NOT: -Drive a car -Paediatric nurse -Drink alcoholic beverages -Take any medication unless instructed by your physician -Make any legal decisions or sign important papers.  Meals: Start with liquid foods such as gelatin or soup. Progress to regular foods as tolerated. Avoid greasy, spicy, heavy foods. If nausea and/or vomiting occur, drink only clear liquids until the nausea and/or vomiting subsides. Call your physician if vomiting continues.  Special Instructions/Symptoms: Your throat may feel dry or sore from the anesthesia or the breathing tube placed in your throat during surgery. If this causes discomfort, gargle with warm salt water. The discomfort should disappear within 24 hours.  If you had a scopolamine patch placed behind your ear for the management of post- operative nausea and/or vomiting:  1. The medication in the patch is effective for 72 hours, after which it should be removed.  Wrap patch in a tissue and discard in the trash. Wash hands thoroughly with soap and water. 2. You may remove the patch earlier than 72 hours if you experience unpleasant side effects which may include dry mouth, dizziness or visual disturbances. 3. Avoid touching the patch. Wash your hands with soap and water after contact with the patch.

## 2021-11-18 NOTE — Anesthesia Postprocedure Evaluation (Signed)
Anesthesia Post Note  Patient: [name]  Procedure(s) Performed: LEFT BREAST LUMPECTOMY WITH RADIOACTIVE SEED LOCALIZATION (Left: Breast)     Patient location during evaluation: PACU Anesthesia Type: General Level of consciousness: awake and alert and oriented Pain management: pain level controlled Vital Signs Assessment: post-procedure vital signs reviewed and stable Respiratory status: spontaneous breathing, nonlabored ventilation and respiratory function stable Cardiovascular status: blood pressure returned to baseline and stable Postop Assessment: no apparent nausea or vomiting Anesthetic complications: no   No notable events documented.  Last Vitals:  Vitals:   11/18/21 1700 11/18/21 1715  BP: 133/82   Pulse: 80 80  Resp: 14 15  Temp:    SpO2: 93% 92%    Last Pain:  Vitals:   11/18/21 1700  TempSrc:   PainSc: 0-No pain                 Ovid Witman A.

## 2021-11-18 NOTE — Op Note (Signed)
Preoperative diagnosis: Stage I left breast cancer  Postoperative diagnosis: Stage I left breast cancer overlapping sites  Procedure: Left breast seed localized lumpectomy  Surgeon: Erroll Luna, MD  Anesthesia: LMA with 0.25% Marcaine plain with epinephrine  EBL: 30 cc  Drains: None  Specimen: Left breast tissue with seed and clip verified by Faxitron.  With additional margins all excised and oriented with ink.  Indications for procedure: The patient is an 87 year old female stage I left breast cancer.  She was in relatively good health.  She was to proceed lumpectomy.  Risks and benefits of surgery as well as medical management of her condition were discussed.  She was deemed to be a good operative candidate and wished to proceed with lumpectomy.  She underwent medical clearance.  She presents today for left breast seed localized lumpectomy.  We discussed medical management as well as nonoperative management with her as well.  She did not wish to undergo nonoperative management for this.The procedure has been discussed with the patient. Alternatives to surgery have been discussed with the patient.  Risks of surgery include bleeding,  Infection,  Seroma formation, death,  and the need for further surgery.   The patient understands and wishes to proceed.      Description of procedure: The patient was met in the holding area and questions were answered.  Left side was marked as correct site.  She underwent seed placement prior to surgery as an outpatient.  She was then brought back to the operative room.  She was placed supine upon the OR table.  After induction of general esthesia, left breast was prepped and draped in sterile fashion and timeout performed.  Proper patient, site procedure verified.  Neoprobe used to identify the seed which was in the central to upper inner quadrant.  Curvilinear incision was made along the medial border of the nipple areolar complex.  Films were available for  review.  Dissection was carried down all tissue and the seed and clip were excised with a grossly negative margin.  The Faxitron image revealed the seed and clip to be present.  Of note there are other clips in her breast which were not related to this.  All margins were excised as well oriented with ink and sent to pathology.  The neoprobe was used and background counts in the breast were 0 irrigation was used.  Arista powder was placed.  The cavity was then oversewn with multiple layers with 3-0 Vicryl.  Clips were placed.  4 Monocryl was used to close the skin in a subcuticular fashion.  Dermabond and breast binder placed.  All counts were found to be correct.  The patient was awoke extubated taken to recovery in satisfactory condition.

## 2021-11-18 NOTE — Interval H&P Note (Signed)
History and Physical Interval Note:  11/18/2021 2:48 PM  [name]  has presented today for surgery, with the diagnosis of LEFT BREAST CANCER.  The various methods of treatment have been discussed with the patient and family. After consideration of risks, benefits and other options for treatment, the patient has consented to  Procedure(s): LEFT BREAST LUMPECTOMY WITH RADIOACTIVE SEED LOCALIZATION (Left) as a surgical intervention.  The patient's history has been reviewed, patient examined, no change in status, stable for surgery.  I have reviewed the patient's chart and labs.  Questions were answered to the patient's satisfaction.   The procedure has been discussed with the patient. Alternatives to surgery have been discussed with the patient.  Risks of surgery include bleeding,  Infection,  Seroma formation, death,  and the need for further surgery.   The patient understands and wishes to proceed.   Turner Daniels MD

## 2021-11-19 ENCOUNTER — Other Ambulatory Visit: Payer: Self-pay | Admitting: Internal Medicine

## 2021-11-20 ENCOUNTER — Encounter (HOSPITAL_BASED_OUTPATIENT_CLINIC_OR_DEPARTMENT_OTHER): Payer: Self-pay | Admitting: Surgery

## 2021-11-20 LAB — SURGICAL PATHOLOGY

## 2021-11-22 ENCOUNTER — Other Ambulatory Visit: Payer: Self-pay | Admitting: Internal Medicine

## 2021-11-23 ENCOUNTER — Encounter: Payer: Self-pay | Admitting: Surgery

## 2021-11-24 ENCOUNTER — Ambulatory Visit (INDEPENDENT_AMBULATORY_CARE_PROVIDER_SITE_OTHER): Payer: Medicare Other

## 2021-11-24 DIAGNOSIS — I480 Paroxysmal atrial fibrillation: Secondary | ICD-10-CM | POA: Diagnosis not present

## 2021-11-24 LAB — CUP PACEART REMOTE DEVICE CHECK
Battery Remaining Longevity: 42 mo
Battery Voltage: 2.94 V
Brady Statistic AP VP Percent: 0 %
Brady Statistic AP VS Percent: 0 %
Brady Statistic AS VP Percent: 99.99 %
Brady Statistic AS VS Percent: 0.01 %
Brady Statistic RA Percent Paced: 0 %
Brady Statistic RV Percent Paced: 99.99 %
Date Time Interrogation Session: 20221213011115
Implantable Lead Implant Date: 20180905
Implantable Lead Implant Date: 20180905
Implantable Lead Location: 753859
Implantable Lead Location: 753860
Implantable Lead Model: 3830
Implantable Lead Model: 5076
Implantable Pulse Generator Implant Date: 20180905
Lead Channel Impedance Value: 285 Ohm
Lead Channel Impedance Value: 342 Ohm
Lead Channel Impedance Value: 361 Ohm
Lead Channel Impedance Value: 551 Ohm
Lead Channel Pacing Threshold Amplitude: 2.375 V
Lead Channel Pacing Threshold Pulse Width: 0.4 ms
Lead Channel Sensing Intrinsic Amplitude: 1.5 mV
Lead Channel Sensing Intrinsic Amplitude: 1.5 mV
Lead Channel Sensing Intrinsic Amplitude: 2.875 mV
Lead Channel Sensing Intrinsic Amplitude: 3.125 mV
Lead Channel Setting Pacing Amplitude: 2.5 V
Lead Channel Setting Pacing Pulse Width: 1 ms
Lead Channel Setting Sensing Sensitivity: 0.9 mV

## 2021-12-02 NOTE — Progress Notes (Signed)
Patient Care Team: Lajean Manes, MD as PCP - General (Internal Medicine) Evans Lance, MD as PCP - Cardiology (Cardiology) Nicholas Lose, MD as Consulting Physician (Hematology and Oncology) Sylvan Cheese, NP as Nurse Practitioner (Nurse Practitioner) Evans Lance, MD as Consulting Physician (Cardiology) Brand Males, MD as Consulting Physician (Pulmonary Disease) Mauro Kaufmann, RN as Oncology Nurse Navigator Rockwell Germany, RN as Oncology Nurse Navigator  DIAGNOSIS:    ICD-10-CM   1. Malignant neoplasm of lower-outer quadrant of left breast of female, estrogen receptor positive ([place])  C50.512    Z17.0       SUMMARY OF ONCOLOGIC HISTORY: Oncology History  Breast cancer of lower-outer quadrant of left female breast ([place])  01/15/2015 Initial Biopsy   Left breast biopsy: Invasive lobular cancer, grade 2, ER+ (94%), PR+ (29%),  HER-2 negative, Ki-67 19%.   01/21/2015 Breast MRI   Left breast:: 1.7 x 1.6 x 1.4 cm irregular enhancing mass lower outer quadrant, no abnormal lymph nodes   01/21/2015 Clinical Stage   Stage IA: TI cN0 M0.   02/14/2015 Surgery   Left breast lumpectomy ([facility]): Invasive lobular cancer, negative for LV I, tumor size 01.7 cm, margins negative, HER-2 was repeated and it was negative   02/14/2015 Pathologic Stage   Stage IA: T1c N0   02/26/2015 Miscellaneous   Pt opted for no RT.   03/12/2015 Procedure   Genetic testing: OvaNext gene panel  ([facility]) revealed VUS at ATM. Otherwise negative BARD1, BRCA1, BRCA2, BRIP1, CDH1, CHEK2, EPCAM, MLH1, MRE11A, MSH2, MSH6, MUTYH, NBN, NF1, PALB2, PMS2, PTEN, RAD50, RAD51C, RAD51D, SMARCA4, STK11, TP53.   04/29/2015 - 02/09/2016 Anti-estrogen oral therapy   Anastrozole 1 mg daily changed to letrozole 2.5 mg daily 09/19/2015 due to dry mouth and loss of taste, stopped due to continuation of same side effects   08/22/2015 Survivorship   A copy of the survivorship care plan was mailed to the  patient in lieu of an in-person visit at her request.   10/21/2021 Relapse/Recurrence   Screening mammogram detected left breast abnormality, biopsy revealed grade 2 invasive lobular cancer ER 100%, PR less than 1%, HER2 equivocal FISH pending, Ki-67 10%     CHIEF COMPLIANT: Follow-up of left breast cancer  INTERVAL HISTORY: [name] is a 87 y.o. with above-mentioned history of  left breast cancer treated with lumpectomy. She could not tolerate anti-estrogen therapy with letrozole or anastrozole and is currently on surveillance. She presents to the clinic today for follow-up.  She has tolerated surgery extremely well without any major problems.  She did have a rash postoperatively which is getting better.  ALLERGIES:  is allergic to adhesive [tape], codeine, latex, and percodan [oxycodone-aspirin].  MEDICATIONS:  Current Outpatient Medications  Medication Sig Dispense Refill   amiodarone (PACERONE) 200 MG tablet Take 0.5 tablets (100 mg total) by mouth daily. 45 tablet 3   Carboxymethylcellul-Glycerin (LUBRICATING EYE DROPS OP) Apply 1 drop to eye daily as needed (dry eyes).     Cyanocobalamin (B-12) 1000 MCG TABS Take 1 tablet by mouth daily.     ELIQUIS 5 MG TABS tablet TAKE 1 TABLET BY MOUTH  TWICE DAILY 180 tablet 2   hydrochlorothiazide (HYDRODIURIL) 25 MG tablet TAKE ONE TABLET ONCE DAILY. 90 tablet 3   levothyroxine (SYNTHROID) 100 MCG tablet Take 100 mcg by mouth daily before breakfast.     losartan (COZAAR) 50 MG tablet TAKE 1 TABLET ONCE DAILY. 90 tablet 3   metoprolol succinate (TOPROL-XL) 25  MG 24 hr tablet TAKE 1 TABLET ONCE DAILY. 30 tablet 6   solifenacin (VESICARE) 5 MG tablet Take 5 mg by mouth daily.     traMADol (ULTRAM) 50 MG tablet Take 1 tablet (50 mg total) by mouth every 6 (six) hours as needed. 20 tablet 0   Vitamin D, Ergocalciferol, (DRISDOL) 50000 units CAPS capsule Take 50,000 Units by mouth every 7 (seven) days.     No current facility-administered  medications for this visit.    PHYSICAL EXAMINATION: ECOG PERFORMANCE STATUS: 1 - Symptomatic but completely ambulatory  Vitals:   12/03/21 1216  BP: 126/72  Pulse: 81  Resp: 18  Temp: 97.9 F (36.6 C)  SpO2: 100%   Filed Weights   12/03/21 1216  Weight: 163 lb 1.6 oz (74 kg)      LABORATORY DATA:  I have reviewed the data as listed CMP Latest Ref Rng & Units 11/12/2021 09/30/2021 03/27/2021  Glucose 70 - 99 mg/dL 89 76 151(H)  BUN 8 - 23 mg/dL 24(H) 22 28(H)  Creatinine 0.44 - 1.00 mg/dL 1.04(H) 0.92 0.91  Sodium 135 - 145 mmol/L 133(L) 138 138  Potassium 3.5 - 5.1 mmol/L 3.7 4.4 4.0  Chloride 98 - 111 mmol/L 99 100 99  CO2 22 - 32 mmol/L $RemoveB'26 25 20  'PeKBVTJP$ Calcium 8.9 - 10.3 mg/dL 8.6(L) 9.0 9.1  Total Protein 6.0 - 8.5 g/dL - 7.4 7.5  Total Bilirubin 0.0 - 1.2 mg/dL - 0.4 0.4  Alkaline Phos 44 - 121 IU/L - 111 106  AST 0 - 40 IU/L - 27 18  ALT 0 - 32 IU/L - 21 15    Lab Results  Component Value Date   WBC 5.9 09/30/2021   HGB 12.2 09/30/2021   HCT 36.9 09/30/2021   MCV 90 09/30/2021   PLT 211 09/30/2021   NEUTROABS 4.4 03/27/2021    ASSESSMENT & PLAN:  Breast cancer of lower-outer quadrant of left female breast Left breast biopsy: Invasive lobular cancer, grade 2, ER 94%, PR 29%, Ki-67 19%, HER-2 negative, 1.7 x 1.6 x 1.4 cm by MRI on 01/21/2015, clinical stage TI cN0 M0 stage IA. Left lumpectomy 02/14/2015: 1.7 cm invasive lobular cancer, grade 1, ER 94%, PA 29%, Ki-67 19%, HER-2 negative, T1 cN0 M0 stage IA pathologic staging Prior treatment: Anastrozole 1 mg daily started 04/27/2015 stopped August 2016 due to dry mouth and lack of taste; change in treatment to letrozole 2.5 mg daily from 09/21/2015 stopped in February 2017 -------------------------------------------------------------------------------------- Recurrence: 10/21/2021:Screening mammogram detected left breast abnormality, biopsy revealed grade 2 invasive lobular cancer ER 100%, PR less than 1%, HER2  equivocal FISH pending, Ki-67 10%   Treatment plan: 11/18/21: Breast conserving surgery: Grade 2 ILC 1.4 cm,  ER 100%, PR less than 1%, HER2 equivocal FISH Neg, Ki-67 10% We discussed the pros and cons of antiestrogen therapy based on her prior experience with intolerance to this medication we decided not to initiate it.   Return to clinic in 6 months for follow-up with laser for long-term survivorship.  After that she could be followed at the long-term clinic annually thereafter.    No orders of the defined types were placed in this encounter.  The patient has a good understanding of the overall plan. she agrees with it. she will call with any problems that may develop before the next visit here.  Total time spent: 30 mins including face to face time and time spent for planning, charting and coordination of care  Korrina Zern K  Lindi Adie, MD, MPH 12/03/2021  I, Thana Ates, am acting as scribe for Dr. Nicholas Lose.  I have reviewed the above documentation for accuracy and completeness, and I agree with the above.

## 2021-12-02 NOTE — Assessment & Plan Note (Signed)
Left breast biopsy: Invasive lobular cancer, grade 2, ER 94%, PR 29%, Ki-67 19%, HER-2 negative, 1.7 x 1.6 x 1.4 cm by MRI on 01/21/2015, clinical stage TI cN0 M0 stage IA. Left lumpectomy 02/14/2015: 1.7 cm invasive lobular cancer, grade 1, ER 94%, PA 29%, Ki-67 19%, HER-2 negative, T1 cN0 M0 stage IA pathologic staging Prior treatment: Anastrozole 1 mg daily started 04/27/2015 stopped August 2016 due to dry mouth and lack of taste; change in treatment to letrozole 2.5 mg daily from 10/09/2016stopped in February 2017 -------------------------------------------------------------------------------------- Recurrence: 10/21/2021:Screening mammogram detected left breast abnormality, biopsy revealed grade 2 invasive lobular cancer ER 100%, PR less than 1%, HER2 equivocal FISH pending, Ki-67 10%  Treatment plan: 1. 11/18/21: Breast conserving surgery: Grade 2 ILC 1.4 cm,  ER 100%, PR less than 1%, HER2 equivocal FISH Neg, Ki-67 10% 2. We will try antiestrogen therapy once again.  Previously she could not tolerate it.  We might try half a tablet daily and see how she does with that.  Return to clinic after surgery to discuss pathology report and start antiestrogen therapy.

## 2021-12-03 ENCOUNTER — Other Ambulatory Visit: Payer: Self-pay

## 2021-12-03 ENCOUNTER — Inpatient Hospital Stay: Payer: Medicare Other | Attending: Hematology and Oncology | Admitting: Hematology and Oncology

## 2021-12-03 DIAGNOSIS — C50512 Malignant neoplasm of lower-outer quadrant of left female breast: Secondary | ICD-10-CM | POA: Diagnosis not present

## 2021-12-03 DIAGNOSIS — Z79899 Other long term (current) drug therapy: Secondary | ICD-10-CM | POA: Diagnosis not present

## 2021-12-03 DIAGNOSIS — Z170 Estrogen receptor positive status [ER+]: Secondary | ICD-10-CM | POA: Insufficient documentation

## 2021-12-04 ENCOUNTER — Ambulatory Visit: Payer: Medicare Other | Admitting: Hematology and Oncology

## 2021-12-04 NOTE — Progress Notes (Signed)
Remote pacemaker transmission.   

## 2022-01-20 DIAGNOSIS — D6869 Other thrombophilia: Secondary | ICD-10-CM | POA: Diagnosis not present

## 2022-01-20 DIAGNOSIS — Z950 Presence of cardiac pacemaker: Secondary | ICD-10-CM | POA: Diagnosis not present

## 2022-01-20 DIAGNOSIS — I872 Venous insufficiency (chronic) (peripheral): Secondary | ICD-10-CM | POA: Diagnosis not present

## 2022-01-20 DIAGNOSIS — J849 Interstitial pulmonary disease, unspecified: Secondary | ICD-10-CM | POA: Diagnosis not present

## 2022-01-20 DIAGNOSIS — I5030 Unspecified diastolic (congestive) heart failure: Secondary | ICD-10-CM | POA: Diagnosis not present

## 2022-01-20 DIAGNOSIS — I10 Essential (primary) hypertension: Secondary | ICD-10-CM | POA: Diagnosis not present

## 2022-01-20 DIAGNOSIS — I4891 Unspecified atrial fibrillation: Secondary | ICD-10-CM | POA: Diagnosis not present

## 2022-01-20 DIAGNOSIS — I700 Atherosclerosis of aorta: Secondary | ICD-10-CM | POA: Diagnosis not present

## 2022-01-20 DIAGNOSIS — E039 Hypothyroidism, unspecified: Secondary | ICD-10-CM | POA: Diagnosis not present

## 2022-02-03 DIAGNOSIS — E538 Deficiency of other specified B group vitamins: Secondary | ICD-10-CM | POA: Diagnosis not present

## 2022-02-03 DIAGNOSIS — I10 Essential (primary) hypertension: Secondary | ICD-10-CM | POA: Diagnosis not present

## 2022-02-03 DIAGNOSIS — R269 Unspecified abnormalities of gait and mobility: Secondary | ICD-10-CM | POA: Diagnosis not present

## 2022-02-09 ENCOUNTER — Encounter (HOSPITAL_COMMUNITY): Payer: Self-pay

## 2022-02-23 ENCOUNTER — Ambulatory Visit (INDEPENDENT_AMBULATORY_CARE_PROVIDER_SITE_OTHER): Payer: Medicare Other

## 2022-02-23 DIAGNOSIS — I471 Supraventricular tachycardia: Secondary | ICD-10-CM

## 2022-02-23 LAB — CUP PACEART REMOTE DEVICE CHECK
Battery Remaining Longevity: 36 mo
Battery Voltage: 2.94 V
Brady Statistic AP VP Percent: 0 %
Brady Statistic AP VS Percent: 0 %
Brady Statistic AS VP Percent: 99.98 %
Brady Statistic AS VS Percent: 0.02 %
Brady Statistic RA Percent Paced: 0 %
Brady Statistic RV Percent Paced: 99.98 %
Date Time Interrogation Session: 20230314053249
Implantable Lead Implant Date: 20180905
Implantable Lead Implant Date: 20180905
Implantable Lead Location: 753859
Implantable Lead Location: 753860
Implantable Lead Model: 3830
Implantable Lead Model: 5076
Implantable Pulse Generator Implant Date: 20180905
Lead Channel Impedance Value: 285 Ohm
Lead Channel Impedance Value: 342 Ohm
Lead Channel Impedance Value: 380 Ohm
Lead Channel Impedance Value: 551 Ohm
Lead Channel Pacing Threshold Amplitude: 2.375 V
Lead Channel Pacing Threshold Pulse Width: 0.4 ms
Lead Channel Sensing Intrinsic Amplitude: 1.5 mV
Lead Channel Sensing Intrinsic Amplitude: 1.5 mV
Lead Channel Sensing Intrinsic Amplitude: 2.875 mV
Lead Channel Sensing Intrinsic Amplitude: 3.125 mV
Lead Channel Setting Pacing Amplitude: 2.5 V
Lead Channel Setting Pacing Pulse Width: 1 ms
Lead Channel Setting Sensing Sensitivity: 0.9 mV

## 2022-03-03 ENCOUNTER — Other Ambulatory Visit: Payer: Self-pay

## 2022-03-03 ENCOUNTER — Ambulatory Visit (HOSPITAL_BASED_OUTPATIENT_CLINIC_OR_DEPARTMENT_OTHER): Payer: Medicare Other | Attending: Geriatric Medicine | Admitting: Physical Therapy

## 2022-03-03 ENCOUNTER — Encounter (HOSPITAL_BASED_OUTPATIENT_CLINIC_OR_DEPARTMENT_OTHER): Payer: Self-pay | Admitting: Physical Therapy

## 2022-03-03 DIAGNOSIS — R2689 Other abnormalities of gait and mobility: Secondary | ICD-10-CM | POA: Insufficient documentation

## 2022-03-03 DIAGNOSIS — R262 Difficulty in walking, not elsewhere classified: Secondary | ICD-10-CM | POA: Insufficient documentation

## 2022-03-03 DIAGNOSIS — M6281 Muscle weakness (generalized): Secondary | ICD-10-CM | POA: Diagnosis not present

## 2022-03-03 NOTE — Therapy (Signed)
?OUTPATIENT PHYSICAL THERAPY LOWER EXTREMITY EVALUATION ? ? ?Patient Name: [name] ?MRN: [id number] ?DOB:11/03/1933, 88 y.o., female ?Today's Date: 03/03/2022 ? ? PT End of Session - 03/03/22 1241   ? ? Visit Number 1   ? Number of Visits 21   ? Date for PT Re-Evaluation 06/01/22   ? Authorization Type [employer] MCR   ? PT Start Time 0930   ? PT Stop Time 1015   ? PT Time Calculation (min) 45 min   ? Activity Tolerance Patient tolerated treatment well   ? Behavior During Therapy [facility] for tasks assessed/performed   ? ?  ?  ? ?  ? ? ?Past Medical History:  ?Diagnosis Date  ? Allergic rhinitis   ? Arthritis   ? bil knees  ? Breast cancer, left breast ([place]) 01/15/2015  ? Invasive Mammary  ? Chronic venous insufficiency   ? Hematuria   ? negative workup - Dr. Reece Agar  ? Hypertension   ? Hypothyroidism   ? Migraine headache   ? MVP (mitral valve prolapse)   ? OAB (overactive bladder)   ? Pacemaker   ? PAF (paroxysmal atrial fibrillation) ([place])   ? SVT (supraventricular tachycardia) ([place]) 06/2010  ? s/p AVNRT ablation  ? Wears glasses   ? ?Past Surgical History:  ?Procedure Laterality Date  ? A-FLUTTER ABLATION N/A 02/04/2017  ? Procedure: A-Flutter Ablation;  Surgeon: Evans Lance, MD;  Location: [place] [facility];  Service: Cardiovascular;  Laterality: N/A;  ? ABDOMINAL HYSTERECTOMY    ? AV NODE ABLATION N/A 08/17/2017  ? Procedure: AV Node Ablation;  Surgeon: Evans Lance, MD;  Location: [place] [facility];  Service: Cardiovascular;  Laterality: N/A;  ? BREAST LUMPECTOMY WITH RADIOACTIVE SEED LOCALIZATION Left 02/14/2015  ? Procedure: BREAST LUMPECTOMY WITH RADIOACTIVE SEED LOCALIZATION;  Surgeon: Alphonsa Overall, MD;  Location: [facility];  Service: General;  Laterality: Left;  ? BREAST LUMPECTOMY WITH RADIOACTIVE SEED LOCALIZATION Left 11/18/2021  ? Procedure: LEFT BREAST LUMPECTOMY WITH RADIOACTIVE SEED LOCALIZATION;  Surgeon: Erroll Luna, MD;  Location: [facility];  Service: General;   Laterality: Left;  ? [place]  ? rt br bx  ? CARDIAC CATHETERIZATION  2011  ? ablasion  ? CARDIOVERSION N/A 08/10/2017  ? Procedure: CARDIOVERSION;  Surgeon: Jerline Pain, MD;  Location: [facility];  Service: Cardiovascular;  Laterality: N/A;  ? COLONOSCOPY    ? DILATION AND CURETTAGE OF UTERUS    ? Left Breast Biopsy  01/15/15  ? PACEMAKER IMPLANT N/A 08/17/2017  ? Procedure: Pacemaker Implant;  Surgeon: Evans Lance, MD;  Location: [place] [facility];  Service: Cardiovascular;  Laterality: N/A;  ? TUBAL LIGATION    ? ?Patient Active Problem List  ? Diagnosis Date Noted  ? Chronic diastolic heart failure ([place]) 03/27/2021  ? PVC's (premature ventricular contractions) 09/23/2020  ? Pacemaker 01/10/2020  ? Pain in left knee 09/27/2018  ? Acute diastolic CHF (congestive heart failure) ([place]) 08/11/2017  ? Acute CHF (congestive heart failure) ([place]) 08/11/2017  ? Acute CHF ([place]) 08/11/2017  ? Persistent atrial fibrillation ([place])   ? ILD (interstitial lung disease) ([place]) 02/21/2017  ? CHF (congestive heart failure) ([place]) 02/21/2017  ? Dyspnea and respiratory abnormality 02/11/2017  ? Atrial flutter ([place]) 02/04/2017  ? Chronic atrial fibrillation ([place]) 01/19/2017  ? Abnormality of gait and mobility 01/19/2017  ? Hypothyroidism 01/19/2017  ? Other specified disorders of bone density and structure, unspecified site 01/19/2017  ? Genetic  testing 03/13/2015  ? Paresthesia of skin 03/03/2015  ? Breast cancer of lower-outer quadrant of left female breast ([place]) 02/06/2015  ? Hematuria   ? PAF (paroxysmal atrial fibrillation) ([place])   ? Chronic venous insufficiency   ? SVT (supraventricular tachycardia) ([place]) 06/12/2010  ? Essential hypertension 04/22/2010  ? MITRAL VALVE PROLAPSE 04/22/2010  ? Atypical atrial flutter ([place]) 04/22/2010  ? SUPRAVENTRICULAR TACHYCARDIA 04/22/2010  ? ? ?PCP: Lajean Manes, MD ? ?REFERRING PROVIDER: Lajean Manes, MD ? ?REFERRING DIAG: R26.9 (ICD-10-CM) - Unspecified abnormalities of gait  and mobility ? ?THERAPY DIAG:  ?Muscle weakness (generalized) ? ?Difficulty walking ? ?Other abnormalities of gait and mobility ? ?ONSET DATE: 2020 ? ?SUBJECTIVE:  ? ?SUBJECTIVE STATEMENT: ?Pt states she has had difficulty with transfers, balance, gait for the last year or so. She has had issues with getting in and out of chairs. Last year, she had a leg ulcer that caused her to be more sedentary with her legs elevated. She does wear compression hose for her LE circulation .[facility]. She has trouble getting out of chairs without arms. She has issues with getting off soft surfaces. Pt has stairs at home but requires a railing. Pt does not currently use an AD. Pt does not currently exercise. She has practiced some transfers but intermittently at home. She feels like she has to stop while walking longer distances due to history of heart issues as well. Pt states she does not feel SOB with ADL but does have SOB with walking up hills or stairs. Pt requires a cart at the grocery store to help with walking. Pt states that her biggest challenge is getting off the toilet or out of a chair. She does not have grab bars or rails at home but does have higher seat commode. Pt states she is mostly send[place] throughout the day. ? ? ?PERTINENT HISTORY: ?Pacemaker, CHF, venous insufficiency, dermatitis ? ?PAIN:  ?Are you having pain? No ? ?PRECAUTIONS: NO ESTIM ? ?WEIGHT BEARING RESTRICTIONS No ? ?FALLS:  ?Has patient fallen in last 6 months? Yes, Number of falls: 1, fell trying ot get out of chair, had to crawl in order to get up, unable to get up from the floor ? ?LIVING ENVIRONMENT: ?Lives with: lives with their family ?Lives in: House/apartment ?Stairs: Yes;  ?Has following equipment at home: None ? ?OCCUPATION: retired ? ?PLOF: Independent with basic ADLs ? ?PATIENT GOALS : Pt would like to strengthen her legs.  ? ? ?OBJECTIVE:  ? ?DIAGNOSTIC FINDINGS: N/A ? ?PATIENT SURVEYS:  ?ABC   ? ?COGNITION: ? Overall cognitive status: Within functional limits for tasks assessed   ?  ?SENSATION: ?WFL ? ?MUSCLE LENGTH: ?Limited HS length in seated HS stretch position, knee flexed in standing ? ?POSTURE:  ?Kyphotic, decrease lordosis ? ?LE ROM: moderately limited in all planes with seated leg crossing, uncrossing, standing marching, as well as step up/down ? ?LE MMT: ? ?MMT Right ?03/03/2022 Left ?03/03/2022  ?Hip flexion 4/5 4/5  ?Hip extension    ?Hip abduction 4/5 4/5  ?Hip adduction 4/5 4/5  ?Knee flexion 4/5 4/5  ?Knee extension 4+/5 4+/5  ? (Blank rows = not tested) ? ? ?FUNCTIONAL TESTS:  ?5 times sit to stand: unable to perform without UE assist, multiple attempts ?Timed up and go (TUG): 19.9s ? ? ?GAIT: ?Distance walked: 48f ?Assistive device utilized: None ?Level of assistance: Complete Independence ?Comments: poor foot clearance, decreased step length, lack of hip  extension ? ? ? ?TODAY'S TREATMENT: ? ? ?Exercises ?Heel Raises with Counter Support - 2 x daily - 7 x weekly - 2 sets - 10 reps ?Seated Hip Abduction with Resistance - 2 x daily - 7 x weekly - 2 sets - 10 reps ?Supine Bridge - 2 x daily - 7 x weekly - 2 sets - 10 reps ? ?Patient Education ?walking program ? ? ?PATIENT EDUCATION:  ?Education details: MOI, walking program, diagnosis, prognosis, anatomy, exercise progression, DOMS expectations, muscle firing,  envelope of function, HEP, POC ? ?Person educated: Patient ?Education method: Explanation, Demonstration, Tactile cues, Verbal cues, and Handouts ?Education comprehension: verbalized understanding, returned demonstration, verbal cues required, and tactile cues required ? ? ?HOME EXERCISE PROGRAM: ?Access Code: [email or web address] ?URL: [email or web address] ?Date: 03/03/2022 ?Prepared by: Daleen Bo ? ?ASSESSMENT: ? ?CLINICAL IMPRESSION: ?Patient is a 88 y.o. female who was seen today for physical therapy evaluation and treatment for cc of gait deviations and balance deficits. Pt  with difficulty with functional transfers, standing balance, as well as endurance. Pt's largest deficit appears to be LE strength. Pt would benefit from continued skilled therapy in order to reach goals an

## 2022-03-08 ENCOUNTER — Ambulatory Visit (HOSPITAL_BASED_OUTPATIENT_CLINIC_OR_DEPARTMENT_OTHER): Payer: Medicare Other | Admitting: Physical Therapy

## 2022-03-08 ENCOUNTER — Other Ambulatory Visit: Payer: Self-pay

## 2022-03-08 ENCOUNTER — Encounter (HOSPITAL_BASED_OUTPATIENT_CLINIC_OR_DEPARTMENT_OTHER): Payer: Self-pay | Admitting: Physical Therapy

## 2022-03-08 DIAGNOSIS — M6281 Muscle weakness (generalized): Secondary | ICD-10-CM

## 2022-03-08 DIAGNOSIS — R262 Difficulty in walking, not elsewhere classified: Secondary | ICD-10-CM

## 2022-03-08 DIAGNOSIS — R2689 Other abnormalities of gait and mobility: Secondary | ICD-10-CM | POA: Diagnosis not present

## 2022-03-08 NOTE — Therapy (Signed)
?OUTPATIENT PHYSICAL THERAPY LOWER EXTREMITY EVALUATION ? ? ?Patient Name: [name] ?MRN: [id number] ?DOB:05/03/1933, 88 y.o., female ?Today's Date: 03/08/2022 ? ? PT End of Session - 03/08/22 1525   ? ? Visit Number 2   ? Number of Visits 21   ? Date for PT Re-Evaluation 06/01/22   ? Authorization Type UHC MCR   ? PT Start Time 1525   pt arrives  ? PT Stop Time 8546   ? PT Time Calculation (min) 32 min   ? Activity Tolerance Patient tolerated treatment well   ? Behavior During Therapy [facility] for tasks assessed/performed   ? ?  ?  ? ?  ? ? ? ?Past Medical History:  ?Diagnosis Date  ? Allergic rhinitis   ? Arthritis   ? bil knees  ? Breast cancer, left breast ([place]) 01/15/2015  ? Invasive Mammary  ? Chronic venous insufficiency   ? Hematuria   ? negative workup - Dr. Reece Agar  ? Hypertension   ? Hypothyroidism   ? Migraine headache   ? MVP (mitral valve prolapse)   ? OAB (overactive bladder)   ? Pacemaker   ? PAF (paroxysmal atrial fibrillation) ([place])   ? SVT (supraventricular tachycardia) ([place]) 06/2010  ? s/p AVNRT ablation  ? Wears glasses   ? ?Past Surgical History:  ?Procedure Laterality Date  ? A-FLUTTER ABLATION N/A 02/04/2017  ? Procedure: A-Flutter Ablation;  Surgeon: Evans Lance, MD;  Location: [place] [facility];  Service: Cardiovascular;  Laterality: N/A;  ? ABDOMINAL HYSTERECTOMY    ? AV NODE ABLATION N/A 08/17/2017  ? Procedure: AV Node Ablation;  Surgeon: Evans Lance, MD;  Location: [place] [facility];  Service: Cardiovascular;  Laterality: N/A;  ? BREAST LUMPECTOMY WITH RADIOACTIVE SEED LOCALIZATION Left 02/14/2015  ? Procedure: BREAST LUMPECTOMY WITH RADIOACTIVE SEED LOCALIZATION;  Surgeon: Alphonsa Overall, MD;  Location: [place];  Service: General;  Laterality: Left;  ? BREAST LUMPECTOMY WITH RADIOACTIVE SEED LOCALIZATION Left 11/18/2021  ? Procedure: LEFT BREAST LUMPECTOMY WITH RADIOACTIVE SEED LOCALIZATION;  Surgeon: Erroll Luna, MD;  Location: [place];   Service: General;  Laterality: Left;  ? [place]  ? rt br bx  ? CARDIAC CATHETERIZATION  2011  ? ablasion  ? CARDIOVERSION N/A 08/10/2017  ? Procedure: CARDIOVERSION;  Surgeon: Jerline Pain, MD;  Location: [facility] ENDOSCOPY;  Service: Cardiovascular;  Laterality: N/A;  ? COLONOSCOPY    ? DILATION AND CURETTAGE OF UTERUS    ? Left Breast Biopsy  01/15/15  ? PACEMAKER IMPLANT N/A 08/17/2017  ? Procedure: Pacemaker Implant;  Surgeon: Evans Lance, MD;  Location: [place] [facility];  Service: Cardiovascular;  Laterality: N/A;  ? TUBAL LIGATION    ? ?Patient Active Problem List  ? Diagnosis Date Noted  ? Chronic diastolic heart failure ([place]) 03/27/2021  ? PVC's (premature ventricular contractions) 09/23/2020  ? Pacemaker 01/10/2020  ? Pain in left knee 09/27/2018  ? Acute diastolic CHF (congestive heart failure) ([place]) 08/11/2017  ? Acute CHF (congestive heart failure) ([place]) 08/11/2017  ? Acute CHF ([place]) 08/11/2017  ? Persistent atrial fibrillation ([place])   ? ILD (interstitial lung disease) ([place]) 02/21/2017  ? CHF (congestive heart failure) ([place]) 02/21/2017  ? Dyspnea and respiratory abnormality 02/11/2017  ? Atrial flutter ([place]) 02/04/2017  ? Chronic atrial fibrillation ([place]) 01/19/2017  ? Abnormality of gait and mobility 01/19/2017  ? Hypothyroidism 01/19/2017  ? Other specified disorders of bone density and structure, unspecified site  01/19/2017  ? Genetic testing 03/13/2015  ? Paresthesia of skin 03/03/2015  ? Breast cancer of lower-outer quadrant of left female breast ([place]) 02/06/2015  ? Hematuria   ? PAF (paroxysmal atrial fibrillation) ([place])   ? Chronic venous insufficiency   ? SVT (supraventricular tachycardia) ([place]) 06/12/2010  ? Essential hypertension 04/22/2010  ? MITRAL VALVE PROLAPSE 04/22/2010  ? Atypical atrial flutter ([place]) 04/22/2010  ? SUPRAVENTRICULAR TACHYCARDIA 04/22/2010  ? ? ?PCP: Lajean Manes, MD ? ?REFERRING PROVIDER: Lajean Manes, MD ? ?REFERRING DIAG: R26.9 (ICD-10-CM) - Unspecified  abnormalities of gait and mobility ? ?THERAPY DIAG:  ?Muscle weakness (generalized) ? ?Difficulty walking ? ?Other abnormalities of gait and mobility ? ?ONSET DATE: 2020 ? ?SUBJECTIVE:  ? ?SUBJECTIVE STATEMENT: ?Pt states she has started walking. She maybe did a "little too much." She was a little SOB after a 15 min walk.  ? ? ?PERTINENT HISTORY: ?Pacemaker, CHF, venous insufficiency, dermatitis ? ?PAIN:  ?Are you having pain? No ? ?PRECAUTIONS: NO ESTIM ? ?WEIGHT BEARING RESTRICTIONS No ? ?FALLS:  ?Has patient fallen in last 6 months? Yes, Number of falls: 1, fell trying ot get out of chair, had to crawl in order to get up, unable to get up from the floor ? ?LIVING ENVIRONMENT: ?Lives with: lives with their family ?Lives in: House/apartment ?Stairs: Yes;  ?Has following equipment at home: None ? ?OCCUPATION: retired ? ?PLOF: Independent with basic ADLs ? ?PATIENT GOALS : Pt would like to strengthen her legs.  ? ? ?OBJECTIVE:  ? ? ?PATIENT SURVEYS:  ?ABC 60% ? ?TODAY'S TREATMENT: ? ? ?Exercises ?Heel Raises with Counter Support - 2 x daily - 7 x weekly - 2 sets - 10 reps ?Seated Hip Abduction with Resistance - 2 x daily - 7 x weekly - 2 sets - 10 reps ?Supine Bridge with RTB sets - 10 reps ?Standing march 2x20 ? ?SLR 2x10 ?Tall table STS 2x10 ? ?Nustep L3 23mn ? ? ?PATIENT EDUCATION:  ?Education details: MOI, walking program, diagnosis, prognosis, anatomy, exercise progression, DOMS expectations, muscle firing,  envelope of function, HEP, POC ? ?Person educated: Patient ?Education method: Explanation, Demonstration, Tactile cues, Verbal cues, and Handouts ?Education comprehension: verbalized understanding, returned demonstration, verbal cues required, and tactile cues required ? ? ?HOME EXERCISE PROGRAM: ?Access Code: [email or web address]?URL: [email or web address] ?Date: 03/03/2022 ?Prepared by: ADaleen Bo? ?ASSESSMENT: ? ?CLINICAL IMPRESSION: ?Pt session limited by late arrival. Pt able to continue with LE  strengthening at today's session. Pt does demonstrate quad extensor lag, suggested quad weakness vs a stiffness issue. Pt was also able to progress to modified height STS exercise today. HEP updated at this time and pt encouraged to continue with walking program. Pt does require longer rest breaks between sets due to muscle fatigue.  Pt would benefit from continued skilled therapy in order to reach goals and maximize functional strength and ROM for prevention of falls.  ? ? ?OBJECTIVE IMPAIRMENTS Abnormal gait, decreased activity tolerance, decreased balance, decreased coordination, decreased endurance, decreased mobility, difficulty walking, decreased ROM, decreased strength, hypomobility, impaired flexibility, improper body mechanics, and postural dysfunction.  ? ?ACTIVITY LIMITATIONS cleaning, community activity, driving, occupation, yard work, shopping, and exercise .  ? ?PERSONAL FACTORS Age, Behavior pattern, Fitness, Time since onset of injury/illness/exacerbation, and 3+ comorbidities:    are also affecting patient's functional outcome.  ? ? ?REHAB POTENTIAL: Fair   ? ?CLINICAL DECISION MAKING: Evolving/moderate complexity ? ?EVALUATION COMPLEXITY: Moderate ? ? ?GOALS: ? ? ?SHORT TERM GOALS: Target date: 04/19/2022 ? ?Pt will  become independent with HEP in order to demonstrate synthesis of PT education. ? ? ?Goal status: INITIAL ? ?2.  Pt will be able to demonstrate STS without UE in order to demonstrate functional improvement in UE/LE function for self-care and house hold duties. ? ? ?Goal status: INITIAL ? ?3.  Pt will be able to demonstrate/report ability to walk >15 mins without seated rest break in order to demonstrate functional improvement and tolerance to exercise and community mobility. ? ? ?Goal status: INITIAL ? ? ? ?LONG TERM GOALS: Target date: 05/31/2022 ? ?Pt  will become independent with final HEP in order to demonstrate synthesis of PT education. ? ?Goal status: INITIAL ? ?2. Pt will be able to  demonstrate kneeling to stand and stand to kneeling transfer with UE support in order to demonstrate functional improvement in LE function for ADL/house hold duties.  ? ? ?Goal status: INITIAL ? ?3.  Pt will be

## 2022-03-09 NOTE — Progress Notes (Signed)
Remote pacemaker transmission.   

## 2022-03-11 ENCOUNTER — Encounter (HOSPITAL_BASED_OUTPATIENT_CLINIC_OR_DEPARTMENT_OTHER): Payer: Self-pay | Admitting: Physical Therapy

## 2022-03-11 ENCOUNTER — Ambulatory Visit (HOSPITAL_BASED_OUTPATIENT_CLINIC_OR_DEPARTMENT_OTHER): Payer: Medicare Other | Admitting: Physical Therapy

## 2022-03-11 DIAGNOSIS — R262 Difficulty in walking, not elsewhere classified: Secondary | ICD-10-CM

## 2022-03-11 DIAGNOSIS — R2689 Other abnormalities of gait and mobility: Secondary | ICD-10-CM

## 2022-03-11 DIAGNOSIS — M6281 Muscle weakness (generalized): Secondary | ICD-10-CM | POA: Diagnosis not present

## 2022-03-11 NOTE — Therapy (Signed)
?OUTPATIENT PHYSICAL THERAPY LOWER EXTREMITY TREATMENT ? ? ?Patient Name: [name] ?MRN: [id number] ?DOB:11/02/1933, 88 y.o., female ?Today's Date: 03/11/2022 ? ? PT End of Session - 03/11/22 1006   ? ? Visit Number 3   ? Number of Visits 21   ? Date for PT Re-Evaluation 06/01/22   ? Authorization Type UHC MCR   ? PT Start Time 1010   ? PT Stop Time 1050   ? PT Time Calculation (min) 40 min   ? Activity Tolerance Patient tolerated treatment well   ? Behavior During Therapy [facility] for tasks assessed/performed   ? ?  ?  ? ?  ? ? ? ?Past Medical History:  ?Diagnosis Date  ? Allergic rhinitis   ? Arthritis   ? bil knees  ? Breast cancer, left breast ([place]) 01/15/2015  ? Invasive Mammary  ? Chronic venous insufficiency   ? Hematuria   ? negative workup - Dr. Reece Agar  ? Hypertension   ? Hypothyroidism   ? Migraine headache   ? MVP (mitral valve prolapse)   ? OAB (overactive bladder)   ? Pacemaker   ? PAF (paroxysmal atrial fibrillation) ([place])   ? SVT (supraventricular tachycardia) ([place]) 06/2010  ? s/p AVNRT ablation  ? Wears glasses   ? ?Past Surgical History:  ?Procedure Laterality Date  ? A-FLUTTER ABLATION N/A 02/04/2017  ? Procedure: A-Flutter Ablation;  Surgeon: Evans Lance, MD;  Location: [place] [facility];  Service: Cardiovascular;  Laterality: N/A;  ? ABDOMINAL HYSTERECTOMY    ? AV NODE ABLATION N/A 08/17/2017  ? Procedure: AV Node Ablation;  Surgeon: Evans Lance, MD;  Location: [place] [facility];  Service: Cardiovascular;  Laterality: N/A;  ? BREAST LUMPECTOMY WITH RADIOACTIVE SEED LOCALIZATION Left 02/14/2015  ? Procedure: BREAST LUMPECTOMY WITH RADIOACTIVE SEED LOCALIZATION;  Surgeon: Alphonsa Overall, MD;  Location: [place];  Service: General;  Laterality: Left;  ? BREAST LUMPECTOMY WITH RADIOACTIVE SEED LOCALIZATION Left 11/18/2021  ? Procedure: LEFT BREAST LUMPECTOMY WITH RADIOACTIVE SEED LOCALIZATION;  Surgeon: Erroll Luna, MD;  Location: [place];  Service: General;   Laterality: Left;  ? [place]  ? rt br bx  ? CARDIAC CATHETERIZATION  2011  ? ablasion  ? CARDIOVERSION N/A 08/10/2017  ? Procedure: CARDIOVERSION;  Surgeon: Jerline Pain, MD;  Location: [facility] ENDOSCOPY;  Service: Cardiovascular;  Laterality: N/A;  ? COLONOSCOPY    ? DILATION AND CURETTAGE OF UTERUS    ? Left Breast Biopsy  01/15/15  ? PACEMAKER IMPLANT N/A 08/17/2017  ? Procedure: Pacemaker Implant;  Surgeon: Evans Lance, MD;  Location: [place] [facility];  Service: Cardiovascular;  Laterality: N/A;  ? TUBAL LIGATION    ? ?Patient Active Problem List  ? Diagnosis Date Noted  ? Chronic diastolic heart failure ([place]) 03/27/2021  ? PVC's (premature ventricular contractions) 09/23/2020  ? Pacemaker 01/10/2020  ? Pain in left knee 09/27/2018  ? Acute diastolic CHF (congestive heart failure) ([place]) 08/11/2017  ? Acute CHF (congestive heart failure) ([place]) 08/11/2017  ? Acute CHF ([place]) 08/11/2017  ? Persistent atrial fibrillation ([place])   ? ILD (interstitial lung disease) ([place]) 02/21/2017  ? CHF (congestive heart failure) ([place]) 02/21/2017  ? Dyspnea and respiratory abnormality 02/11/2017  ? Atrial flutter ([place]) 02/04/2017  ? Chronic atrial fibrillation ([place]) 01/19/2017  ? Abnormality of gait and mobility 01/19/2017  ? Hypothyroidism 01/19/2017  ? Other specified disorders of bone density and structure, unspecified site 01/19/2017  ?  Genetic testing 03/13/2015  ? Paresthesia of skin 03/03/2015  ? Breast cancer of lower-outer quadrant of left female breast ([place]) 02/06/2015  ? Hematuria   ? PAF (paroxysmal atrial fibrillation) ([place])   ? Chronic venous insufficiency   ? SVT (supraventricular tachycardia) ([place]) 06/12/2010  ? Essential hypertension 04/22/2010  ? MITRAL VALVE PROLAPSE 04/22/2010  ? Atypical atrial flutter ([place]) 04/22/2010  ? SUPRAVENTRICULAR TACHYCARDIA 04/22/2010  ? ? ?PCP: Lajean Manes, MD ? ?REFERRING PROVIDER: Lajean Manes, MD ? ?REFERRING DIAG: R26.9 (ICD-10-CM) - Unspecified abnormalities of gait  and mobility ? ?THERAPY DIAG:  ?Muscle weakness (generalized) ? ?Difficulty walking ? ?Other abnormalities of gait and mobility ? ?ONSET DATE: 2020 ? ?SUBJECTIVE:  ? ?SUBJECTIVE STATEMENT: ?Pt states her thighs were a little sore after last session but no other issues. She has been continuing to walk with her husband.  ? ? ?PERTINENT HISTORY: ?Pacemaker, CHF, venous insufficiency, dermatitis ? ?PAIN:  ?Are you having pain? No ? ?PRECAUTIONS: NO ESTIM ? ?WEIGHT BEARING RESTRICTIONS No ? ?FALLS:  ?Has patient fallen in last 6 months? Yes, Number of falls: 1, fell trying ot get out of chair, had to crawl in order to get up, unable to get up from the floor ? ?LIVING ENVIRONMENT: ?Lives with: lives with their family ?Lives in: House/apartment ?Stairs: Yes;  ?Has following equipment at home: None ? ?OCCUPATION: retired ? ?PLOF: Independent with basic ADLs ? ?PATIENT GOALS : Pt would like to strengthen her legs.  ? ? ?OBJECTIVE:  ? ? ?PATIENT SURVEYS:  ?ABC 60% ? ?TODAY'S TREATMENT: ? ? ?Exercises ?Heel Raises with Counter Support - 2 x daily - 7 x weekly - 2 sets - 10 reps ?Seated Hip Abduction with Resistance - 2 x daily - 7 x weekly - 2 sets - 10 reps ?Supine Bridge with 5lb across hips - 3x10 reps ?LAQ 3lbs 2x10 each ?Standing HS curl 2x10 each 3lbs ?Standing march 2x20 fingertip support ? ?SLR 2x10 ?Tall table STS 2x10 ? ?Nustep L3 41mn ? ? ?PATIENT EDUCATION:  ?Education details: walking program, exercise progression, DOMS expectations, muscle firing,  envelope of function, HEP, POC ? ?Person educated: Patient ?Education method: Explanation, Demonstration, Tactile cues, Verbal cues, and Handouts ?Education comprehension: verbalized understanding, returned demonstration, verbal cues required, and tactile cues required ? ? ?HOME EXERCISE PROGRAM: ?Access Code: [email or web address]?URL: [email or web address] ?Date: 03/03/2022 ?Prepared by: ADaleen Bo? ?ASSESSMENT: ? ?CLINICAL IMPRESSION: ?Pt able to progress volume  of exercise at today's session as well as intensity of hip extension exercise without issue. Pt demonstrated good neuro adaptation and has improved motor control compared to last session. Plan to update HEP at next session if pt is not overly sore or fatigued from today's session. Pt is progressing faster than expected.  Pt would benefit from continued skilled therapy in order to reach goals and maximize functional strength and ROM for prevention of falls.  ? ? ?OBJECTIVE IMPAIRMENTS Abnormal gait, decreased activity tolerance, decreased balance, decreased coordination, decreased endurance, decreased mobility, difficulty walking, decreased ROM, decreased strength, hypomobility, impaired flexibility, improper body mechanics, and postural dysfunction.  ? ?ACTIVITY LIMITATIONS cleaning, community activity, driving, occupation, yard work, shopping, and exercise .  ? ?PERSONAL FACTORS Age, Behavior pattern, Fitness, Time since onset of injury/illness/exacerbation, and 3+ comorbidities:    are also affecting patient's functional outcome.  ? ? ?REHAB POTENTIAL: Fair   ? ?CLINICAL DECISION MAKING: Evolving/moderate complexity ? ?EVALUATION COMPLEXITY: Moderate ? ? ?GOALS: ? ? ?SHORT TERM GOALS: Target date: 04/22/2022 ? ?Pt will become  independent with HEP in order to demonstrate synthesis of PT education. ? ? ?Goal status: INITIAL ? ?2.  Pt will be able to demonstrate STS without UE in order to demonstrate functional improvement in UE/LE function for self-care and house hold duties. ? ? ?Goal status: INITIAL ? ?3.  Pt will be able to demonstrate/report ability to walk >15 mins without seated rest break in order to demonstrate functional improvement and tolerance to exercise and community mobility. ? ? ?Goal status: INITIAL ? ? ? ?LONG TERM GOALS: Target date: 06/03/2022 ? ?Pt  will become independent with final HEP in order to demonstrate synthesis of PT education. ? ?Goal status: INITIAL ? ?2. Pt will be able to demonstrate  kneeling to stand and stand to kneeling transfer with UE support in order to demonstrate functional improvement in LE function for ADL/house hold duties.  ? ? ?Goal status: INITIAL ? ?3.  Pt will be

## 2022-03-17 ENCOUNTER — Ambulatory Visit (HOSPITAL_BASED_OUTPATIENT_CLINIC_OR_DEPARTMENT_OTHER): Payer: Medicare Other | Attending: Geriatric Medicine | Admitting: Physical Therapy

## 2022-03-17 ENCOUNTER — Encounter (HOSPITAL_BASED_OUTPATIENT_CLINIC_OR_DEPARTMENT_OTHER): Payer: Self-pay | Admitting: Physical Therapy

## 2022-03-17 DIAGNOSIS — R262 Difficulty in walking, not elsewhere classified: Secondary | ICD-10-CM

## 2022-03-17 DIAGNOSIS — R2689 Other abnormalities of gait and mobility: Secondary | ICD-10-CM | POA: Diagnosis not present

## 2022-03-17 DIAGNOSIS — M6281 Muscle weakness (generalized): Secondary | ICD-10-CM

## 2022-03-17 NOTE — Therapy (Signed)
?OUTPATIENT PHYSICAL THERAPY LOWER EXTREMITY TREATMENT ? ? ?Patient Name: [name] ?MRN: [id number] ?DOB:[birth year], 88 y.o., female ?Today's Date: 03/17/2022 ? ? PT End of Session - 03/17/22 1320   ? ? Visit Number 4   ? Number of Visits 21   ? Date for PT Re-Evaluation 06/01/22   ? Authorization Type UHC MCR   ? PT Start Time 1320   ? PT Stop Time 1400   ? PT Time Calculation (min) 40 min   ? Activity Tolerance Patient tolerated treatment well   ? Behavior During Therapy [facility] for tasks assessed/performed   ? ?  ?  ? ?  ? ? ? ?Past Medical History:  ?Diagnosis Date  ? Allergic rhinitis   ? Arthritis   ? bil knees  ? Breast cancer, left breast ([place]) 01/15/2015  ? Invasive Mammary  ? Chronic venous insufficiency   ? Hematuria   ? negative workup - Dr. Reece Agar  ? Hypertension   ? Hypothyroidism   ? Migraine headache   ? MVP (mitral valve prolapse)   ? OAB (overactive bladder)   ? Pacemaker   ? PAF (paroxysmal atrial fibrillation) ([place])   ? SVT (supraventricular tachycardia) ([place]) 06/2010  ? s/p AVNRT ablation  ? Wears glasses   ? ?Past Surgical History:  ?Procedure Laterality Date  ? A-FLUTTER ABLATION N/A 02/04/2017  ? Procedure: A-Flutter Ablation;  Surgeon: Evans Lance, MD;  Location: [place] [facility];  Service: Cardiovascular;  Laterality: N/A;  ? ABDOMINAL HYSTERECTOMY    ? AV NODE ABLATION N/A 08/17/2017  ? Procedure: AV Node Ablation;  Surgeon: Evans Lance, MD;  Location: [place] [facility];  Service: Cardiovascular;  Laterality: N/A;  ? BREAST LUMPECTOMY WITH RADIOACTIVE SEED LOCALIZATION Left 02/14/2015  ? Procedure: BREAST LUMPECTOMY WITH RADIOACTIVE SEED LOCALIZATION;  Surgeon: Alphonsa Overall, MD;  Location: [place];  Service: General;  Laterality: Left;  ? BREAST LUMPECTOMY WITH RADIOACTIVE SEED LOCALIZATION Left 11/18/2021  ? Procedure: LEFT BREAST LUMPECTOMY WITH RADIOACTIVE SEED LOCALIZATION;  Surgeon: Erroll Luna, MD;  Location: [place];  Service: General;   Laterality: Left;  ? [place]  ? rt br bx  ? CARDIAC CATHETERIZATION  2011  ? ablasion  ? CARDIOVERSION N/A 08/10/2017  ? Procedure: CARDIOVERSION;  Surgeon: Jerline Pain, MD;  Location: [facility] ENDOSCOPY;  Service: Cardiovascular;  Laterality: N/A;  ? COLONOSCOPY    ? DILATION AND CURETTAGE OF UTERUS    ? Left Breast Biopsy  01/15/15  ? PACEMAKER IMPLANT N/A 08/17/2017  ? Procedure: Pacemaker Implant;  Surgeon: Evans Lance, MD;  Location: [place] [facility];  Service: Cardiovascular;  Laterality: N/A;  ? TUBAL LIGATION    ? ?Patient Active Problem List  ? Diagnosis Date Noted  ? Chronic diastolic heart failure ([place]) 03/27/2021  ? PVC's (premature ventricular contractions) 09/23/2020  ? Pacemaker 01/10/2020  ? Pain in left knee 09/27/2018  ? Acute diastolic CHF (congestive heart failure) ([place]) 08/11/2017  ? Acute CHF (congestive heart failure) ([place]) 08/11/2017  ? Acute CHF ([place]) 08/11/2017  ? Persistent atrial fibrillation ([place])   ? ILD (interstitial lung disease) ([place]) 02/21/2017  ? CHF (congestive heart failure) ([place]) 02/21/2017  ? Dyspnea and respiratory abnormality 02/11/2017  ? Atrial flutter ([place]) 02/04/2017  ? Chronic atrial fibrillation ([place]) 01/19/2017  ? Abnormality of gait and mobility 01/19/2017  ? Hypothyroidism 01/19/2017  ? Other specified disorders of bone density and structure, unspecified site 01/19/2017  ?  Genetic testing 03/13/2015  ? Paresthesia of skin 03/03/2015  ? Breast cancer of lower-outer quadrant of left female breast ([place]) 02/06/2015  ? Hematuria   ? PAF (paroxysmal atrial fibrillation) ([place])   ? Chronic venous insufficiency   ? SVT (supraventricular tachycardia) ([place]) 06/12/2010  ? Essential hypertension 04/22/2010  ? MITRAL VALVE PROLAPSE 04/22/2010  ? Atypical atrial flutter ([place]) 04/22/2010  ? SUPRAVENTRICULAR TACHYCARDIA 04/22/2010  ? ? ?PCP: Lajean Manes, MD ? ?REFERRING PROVIDER: Lajean Manes, MD ? ?REFERRING DIAG: R26.9 (ICD-10-CM) - Unspecified abnormalities of gait  and mobility ? ?THERAPY DIAG:  ?Muscle weakness (generalized) ? ?Difficulty walking ? ?Other abnormalities of gait and mobility ? ?ONSET DATE: 2020 ? ?SUBJECTIVE:  ? ?SUBJECTIVE STATEMENT: ?Pt states she can tell the therapy is helping. She wishes she could get better faster.  She's considering joining [employer] of [employer] at [employer] after finishing therapy.  She may like to be able to get back to gardening someday.  ? ?PERTINENT HISTORY: ?Pacemaker, CHF, venous insufficiency, dermatitis ? ?PAIN:  ?Are you having pain? No ? ?PRECAUTIONS: NO ESTIM ? ?WEIGHT BEARING RESTRICTIONS No ? ?FALLS:  ?Has patient fallen in last 6 months? Yes, Number of falls: 1, fell trying ot get out of chair, had to crawl in order to get up, unable to get up from the floor ? ?LIVING ENVIRONMENT: ?Lives with: lives with their family ?Lives in: House/apartment ?Stairs: Yes;  ?Has following equipment at home: None ? ?OCCUPATION: retired ? ?PLOF: Independent with basic ADLs ? ?PATIENT GOALS : Pt would like to strengthen her legs.  ? ? ?OBJECTIVE:  ? ? ?PATIENT SURVEYS:  ?ABC 60% ? ?TODAY'S TREATMENT: ? ? ?Exercises ?  Nustep L4 (moving at 25 SPM)to L3 (up to 55 SPM) x 84mn  ?  LAQ 4lbs 2x10 each ?  Sit to/from stand from elevated table, decreasing table height.  Eccentric lowering. X 10  x 2 (arms outstretched)  ?At counter:  ?Heel/toe Raises x 10 reps ?  Standing march 2x20  fingertips touching as needed ?  Semi tandem stance x 15 sec with intermittent UE to steady ?Standing HS curl 2x10 each 3lbs ?Standing Hip Abduction 2 x 10  ? ?PATIENT EDUCATION:  ?Education details: walking program, exercise progression, DOMS expectations, muscle firing,  envelope of function, HEP, POC ? ?Person educated: Patient ?Education method: Explanation, Demonstration, Tactile cues, Verbal cues, and Handouts ?Education comprehension: verbalized understanding, returned demonstration, verbal cues required, and tactile cues required ? ? ?HOME EXERCISE PROGRAM: ?Access Code:  [email or web address]?URL: [email or web address] ?Date: 03/03/2022 ?Prepared by: ADaleen Bo? ?ASSESSMENT: ? ?CLINICAL IMPRESSION: ?Pt able to tolerate increased weight with LAQ.  Demonstrating improved control of eccentric lowering to elevated surface with repetition and cues. Tolerated all exercises without any production of pain.  Pt would benefit from continued skilled therapy in order to reach goals and maximize functional strength and ROM for prevention of falls.  ? ? ?OBJECTIVE IMPAIRMENTS Abnormal gait, decreased activity tolerance, decreased balance, decreased coordination, decreased endurance, decreased mobility, difficulty walking, decreased ROM, decreased strength, hypomobility, impaired flexibility, improper body mechanics, and postural dysfunction.  ? ?ACTIVITY LIMITATIONS cleaning, community activity, driving, occupation, yard work, shopping, and exercise .  ? ?PERSONAL FACTORS Age, Behavior pattern, Fitness, Time since onset of injury/illness/exacerbation, and 3+ comorbidities:    are also affecting patient's functional outcome.  ? ? ?REHAB POTENTIAL: Fair   ? ?CLINICAL DECISION MAKING: Evolving/moderate complexity ? ?EVALUATION COMPLEXITY: Moderate ? ? ?GOALS: ? ? ?SHORT TERM GOALS: Target date: 04/28/2022 ? ?Pt will  become independent with HEP in order to demonstrate synthesis of PT education. ? ? ?Goal status: INITIAL ? ?2.  Pt will be able to demonstrate STS without UE in order to demonstrate functional improvement in UE/LE function for self-care and house hold duties. ? ? ?Goal status: INITIAL ? ?3.  Pt will be able to demonstrate/report ability to walk >15 mins without seated rest break in order to demonstrate functional improvement and tolerance to exercise and community mobility. ? ? ?Goal status: INITIAL ? ? ? ?LONG TERM GOALS: Target date: 06/09/2022 ? ?Pt  will become independent with final HEP in order to demonstrate synthesis of PT education. ? ?Goal status: INITIAL ? ?2. Pt will be able  to demonstrate kneeling to stand and stand to kneeling transfer with UE support in order to demonstrate functional improvement in LE function for ADL/house hold duties.  ? ? ?Goal status: INITIAL ? ?3

## 2022-03-19 ENCOUNTER — Ambulatory Visit (HOSPITAL_BASED_OUTPATIENT_CLINIC_OR_DEPARTMENT_OTHER): Payer: Medicare Other | Admitting: Physical Therapy

## 2022-03-19 ENCOUNTER — Encounter (HOSPITAL_BASED_OUTPATIENT_CLINIC_OR_DEPARTMENT_OTHER): Payer: Self-pay | Admitting: Physical Therapy

## 2022-03-19 DIAGNOSIS — M6281 Muscle weakness (generalized): Secondary | ICD-10-CM

## 2022-03-19 DIAGNOSIS — R2689 Other abnormalities of gait and mobility: Secondary | ICD-10-CM

## 2022-03-19 DIAGNOSIS — R262 Difficulty in walking, not elsewhere classified: Secondary | ICD-10-CM

## 2022-03-19 NOTE — Therapy (Signed)
?OUTPATIENT PHYSICAL THERAPY LOWER EXTREMITY TREATMENT ? ? ?Patient Name: [name] ?MRN: [id number] ?DOB:29-Aug-1933, 88 y.o., female ?Today's Date: 03/19/2022 ? ? PT End of Session - 03/19/22 1524   ? ? Visit Number 5   ? Number of Visits 21   ? Date for PT Re-Evaluation 06/01/22   ? Authorization Type UHC MCR   ? PT Start Time 7989   ? PT Stop Time 1558   ? PT Time Calculation (min) 40 min   ? Activity Tolerance Patient tolerated treatment well   ? Behavior During Therapy [facility] for tasks assessed/performed   ? ?  ?  ? ?  ? ? ? ?Past Medical History:  ?Diagnosis Date  ? Allergic rhinitis   ? Arthritis   ? bil knees  ? Breast cancer, left breast ([place]) 01/15/2015  ? Invasive Mammary  ? Chronic venous insufficiency   ? Hematuria   ? negative workup - Dr. Reece Agar  ? Hypertension   ? Hypothyroidism   ? Migraine headache   ? MVP (mitral valve prolapse)   ? OAB (overactive bladder)   ? Pacemaker   ? PAF (paroxysmal atrial fibrillation) ([place])   ? SVT (supraventricular tachycardia) ([place]) 06/2010  ? s/p AVNRT ablation  ? Wears glasses   ? ?Past Surgical History:  ?Procedure Laterality Date  ? A-FLUTTER ABLATION N/A 02/04/2017  ? Procedure: A-Flutter Ablation;  Surgeon: Evans Lance, MD;  Location: [place] [facility];  Service: Cardiovascular;  Laterality: N/A;  ? ABDOMINAL HYSTERECTOMY    ? AV NODE ABLATION N/A 08/17/2017  ? Procedure: AV Node Ablation;  Surgeon: Evans Lance, MD;  Location: [place] [facility];  Service: Cardiovascular;  Laterality: N/A;  ? BREAST LUMPECTOMY WITH RADIOACTIVE SEED LOCALIZATION Left 02/14/2015  ? Procedure: BREAST LUMPECTOMY WITH RADIOACTIVE SEED LOCALIZATION;  Surgeon: Alphonsa Overall, MD;  Location: [place];  Service: General;  Laterality: Left;  ? BREAST LUMPECTOMY WITH RADIOACTIVE SEED LOCALIZATION Left 11/18/2021  ? Procedure: LEFT BREAST LUMPECTOMY WITH RADIOACTIVE SEED LOCALIZATION;  Surgeon: Erroll Luna, MD;  Location: [place];  Service: General;   Laterality: Left;  ? [place]  ? rt br bx  ? CARDIAC CATHETERIZATION  2011  ? ablasion  ? CARDIOVERSION N/A 08/10/2017  ? Procedure: CARDIOVERSION;  Surgeon: Jerline Pain, MD;  Location[facility]OSCOPY;  Service: Cardiovascular;  Laterality: N/A;  ? COLONOSCOPY    ? DILATION AND CURETTAGE OF UTERUS    ? Left Breast Biopsy  01/15/15  ? PACEMAKER IMPLANT N/A 08/17/2017  ? Procedure: Pacemaker Implant;  Surgeon: Evans Lance, MD;  Location: [place] [facility];  Service: Cardiovascular;  Laterality: N/A;  ? TUBAL LIGATION    ? ?Patient Active Problem List  ? Diagnosis Date Noted  ? Chronic diastolic heart failure ([place]) 03/27/2021  ? PVC's (premature ventricular contractions) 09/23/2020  ? Pacemaker 01/10/2020  ? Pain in left knee 09/27/2018  ? Acute diastolic CHF (congestive heart failure) ([place]) 08/11/2017  ? Acute CHF (congestive heart failure) ([place]) 08/11/2017  ? Acute CHF ([place]) 08/11/2017  ? Persistent atrial fibrillation ([place])   ? ILD (interstitial lung disease) ([place]) 02/21/2017  ? CHF (congestive heart failure) ([place]) 02/21/2017  ? Dyspnea and respiratory abnormality 02/11/2017  ? Atrial flutter ([place]) 02/04/2017  ? Chronic atrial fibrillation ([place]) 01/19/2017  ? Abnormality of gait and mobility 01/19/2017  ? Hypothyroidism 01/19/2017  ? Other specified disorders of bone density and structure, unspecified site 01/19/2017  ?  Genetic testing 03/13/2015  ? Paresthesia of skin 03/03/2015  ? Breast cancer of lower-outer quadrant of left female breast ([place]) 02/06/2015  ? Hematuria   ? PAF (paroxysmal atrial fibrillation) ([place])   ? Chronic venous insufficiency   ? SVT (supraventricular tachycardia) ([place]) 06/12/2010  ? Essential hypertension 04/22/2010  ? MITRAL VALVE PROLAPSE 04/22/2010  ? Atypical atrial flutter ([place]) 04/22/2010  ? SUPRAVENTRICULAR TACHYCARDIA 04/22/2010  ? ? ?PCP: Lajean Manes, MD ? ?REFERRING PROVIDER: Lajean Manes, MD ? ?REFERRING DIAG: R26.9 (ICD-10-CM) - Unspecified abnormalities of gait  and mobility ? ?THERAPY DIAG:  ?Muscle weakness (generalized) ? ?Difficulty walking ? ?Other abnormalities of gait and mobility ? ?ONSET DATE: 2020 ? ?SUBJECTIVE:  ? ?SUBJECTIVE STATEMENT: ?Pt tolerated exercises from last session well.  No new changes.  ? ?PERTINENT HISTORY: ?Pacemaker, CHF, venous insufficiency, dermatitis ? ?PAIN:  ?Are you having pain? No ? ?PRECAUTIONS: NO ESTIM ? ?WEIGHT BEARING RESTRICTIONS No ? ?FALLS:  ?Has patient fallen in last 6 months? Yes, Number of falls: 1, fell trying ot get out of chair, had to crawl in order to get up, unable to get up from the floor ? ?LIVING ENVIRONMENT: ?Lives with: lives with their family ?Lives in: House/apartment ?Stairs: Yes;  ?Has following equipment at home: None ? ?OCCUPATION: retired ? ?PLOF: Independent with basic ADLs ? ?PATIENT GOALS : Pt would like to strengthen her legs.  ? ? ?OBJECTIVE:  ? ? ?PATIENT SURVEYS:  ?ABC 60% ? ?TODAY'S TREATMENT: ? ?03/19/22 ?Exercises ?  Nustep L3: 2mn (legs only) ?  Sit to/from stand from elevated table.  Eccentric lowering. X 10 (arms outstretched) x 2 sets  (split up during session) ?  Seated resisted clams GTB x 10 rep, 5 sec hold, 2 sets   ?  Supine bridge with 5# on lap x 10 x 2 ?  LAQ 4lbs 10 each ?At counter:  ?Heel/toe Raises x 10 reps x 2 ?  Semi tandem stance x 15 sec with intermittent UE to steady ?  SLS with opp toe taps front, side back with light UE support on counter (CGA for safety)x 5 each side ?Standing Hip Abduction x10  ?Mini split squat with back knee touching airex pad on top of tall box x 5 each side, UE on counter ? ?PATIENT EDUCATION:  ?Education details: walking program, exercise progression, DOMS expectations, muscle firing,  envelope of function, HEP, POC ? ?Person educated: Patient ?Education method: Explanation, Demonstration, Tactile cues, Verbal cues, and Handouts ?Education comprehension: verbalized understanding, returned demonstration, verbal cues required, and tactile cues  required ? ? ?HOME EXERCISE PROGRAM: ?Access Code: [email or web address]?URL: [email or web address] ?Date: 03/03/2022 ?Prepared by: ADaleen Bo? ?ASSESSMENT: ? ?CLINICAL IMPRESSION: ?Pt unable to get up from standard chair height table without use of UE; does ok from elevated surface with cues for forward weight shift during upward motion.  She tolerated all exercises without any production of pain. Noticed LLE is functionally weaker than RLE with standing exercises. She demonstrates decreased balance with narrow BOS and with LLE in back during semi tandem stance.  Pt would benefit from continued skilled therapy in order to reach goals and maximize functional strength and ROM for prevention of falls.  ? ? ?OBJECTIVE IMPAIRMENTS Abnormal gait, decreased activity tolerance, decreased balance, decreased coordination, decreased endurance, decreased mobility, difficulty walking, decreased ROM, decreased strength, hypomobility, impaired flexibility, improper body mechanics, and postural dysfunction.  ? ?ACTIVITY LIMITATIONS cleaning, community activity, driving, occupation, yard work, shopping, and exercise .  ? ?PERSONAL FACTORS Age,  Behavior pattern, Fitness, Time since onset of injury/illness/exacerbation, and 3+ comorbidities:    are also affecting patient's functional outcome.  ? ? ?REHAB POTENTIAL: Fair   ? ?CLINICAL DECISION MAKING: Evolving/moderate complexity ? ?EVALUATION COMPLEXITY: Moderate ? ? ?GOALS: ? ? ?SHORT TERM GOALS: Target date: 04/30/2022 ? ?Pt will become independent with HEP in order to demonstrate synthesis of PT education. ? ? ?Goal status: INITIAL ? ?2.  Pt will be able to demonstrate STS without UE in order to demonstrate functional improvement in UE/LE function for self-care and house hold duties. ? ? ?Goal status: INITIAL ? ?3.  Pt will be able to demonstrate/report ability to walk >15 mins without seated rest break in order to demonstrate functional improvement and tolerance to exercise and  community mobility. ? ? ?Goal status: INITIAL ? ? ? ?LONG TERM GOALS: Target date: 06/11/2022 ? ?Pt  will become independent with final HEP in order to demonstrate synthesis of PT education. ? ?Goal status: INI

## 2022-03-22 DIAGNOSIS — H524 Presbyopia: Secondary | ICD-10-CM | POA: Diagnosis not present

## 2022-03-22 DIAGNOSIS — H52223 Regular astigmatism, bilateral: Secondary | ICD-10-CM | POA: Diagnosis not present

## 2022-03-22 DIAGNOSIS — H1849 Other corneal degeneration: Secondary | ICD-10-CM | POA: Diagnosis not present

## 2022-03-22 DIAGNOSIS — I10 Essential (primary) hypertension: Secondary | ICD-10-CM | POA: Diagnosis not present

## 2022-03-24 ENCOUNTER — Telehealth (HOSPITAL_BASED_OUTPATIENT_CLINIC_OR_DEPARTMENT_OTHER): Payer: Self-pay | Admitting: Physical Therapy

## 2022-03-24 ENCOUNTER — Ambulatory Visit (HOSPITAL_BASED_OUTPATIENT_CLINIC_OR_DEPARTMENT_OTHER): Payer: Medicare Other | Admitting: Physical Therapy

## 2022-03-24 NOTE — Telephone Encounter (Signed)
Patient did not show for her PT appt today.  Called and spoke to patient. She thought appt was a 3:15pm, not 2:15pm.  Confirmed all upcoming appts with her.   ? ?Kerin Perna, PTA ?03/24/22 2:37 PM ? ?

## 2022-03-30 ENCOUNTER — Encounter (HOSPITAL_BASED_OUTPATIENT_CLINIC_OR_DEPARTMENT_OTHER): Payer: Self-pay | Admitting: Physical Therapy

## 2022-03-30 ENCOUNTER — Ambulatory Visit (HOSPITAL_BASED_OUTPATIENT_CLINIC_OR_DEPARTMENT_OTHER): Payer: Medicare Other | Admitting: Physical Therapy

## 2022-03-30 DIAGNOSIS — R2689 Other abnormalities of gait and mobility: Secondary | ICD-10-CM | POA: Diagnosis not present

## 2022-03-30 DIAGNOSIS — R262 Difficulty in walking, not elsewhere classified: Secondary | ICD-10-CM

## 2022-03-30 DIAGNOSIS — M6281 Muscle weakness (generalized): Secondary | ICD-10-CM

## 2022-03-30 NOTE — Therapy (Signed)
?OUTPATIENT PHYSICAL THERAPY LOWER EXTREMITY TREATMENT ? ? ?Patient Name: [name] ?MRN: [id number] ?DOB:1933-10-29, 88 y.o., female ?Today's Date: 03/30/2022 ? ? PT End of Session - 03/30/22 1558   ? ? Visit Number 6   ? Number of Visits 21   ? Date for PT Re-Evaluation 06/01/22   ? Authorization Type UHC MCR   ? PT Start Time 1515   ? PT Stop Time 0076   ? PT Time Calculation (min) 40 min   ? Activity Tolerance Patient tolerated treatment well   ? Behavior During Therapy [facility] for tasks assessed/performed   ? ?  ?  ? ?  ? ? ? ? ?Past Medical History:  ?Diagnosis Date  ? Allergic rhinitis   ? Arthritis   ? bil knees  ? Breast cancer, left breast ([place]) 01/15/2015  ? Invasive Mammary  ? Chronic venous insufficiency   ? Hematuria   ? negative workup - Dr. Reece Agar  ? Hypertension   ? Hypothyroidism   ? Migraine headache   ? MVP (mitral valve prolapse)   ? OAB (overactive bladder)   ? Pacemaker   ? PAF (paroxysmal atrial fibrillation) ([place])   ? SVT (supraventricular tachycardia) ([place]) 06/2010  ? s/p AVNRT ablation  ? Wears glasses   ? ?Past Surgical History:  ?Procedure Laterality Date  ? A-FLUTTER ABLATION N/A 02/04/2017  ? Procedure: A-Flutter Ablation;  Surgeon: Evans Lance, MD;  Location: [place] [facility];  Service: Cardiovascular;  Laterality: N/A;  ? ABDOMINAL HYSTERECTOMY    ? AV NODE ABLATION N/A 08/17/2017  ? Procedure: AV Node Ablation;  Surgeon: Evans Lance, MD;  Location: [place] [facility];  Service: Cardiovascular;  Laterality: N/A;  ? BREAST LUMPECTOMY WITH RADIOACTIVE SEED LOCALIZATION Left 02/14/2015  ? Procedure: BREAST LUMPECTOMY WITH RADIOACTIVE SEED LOCALIZATION;  Surgeon: Alphonsa Overall, MD;  Location: [place];  Service: General;  Laterality: Left;  ? BREAST LUMPECTOMY WITH RADIOACTIVE SEED LOCALIZATION Left 11/18/2021  ? Procedure: LEFT BREAST LUMPECTOMY WITH RADIOACTIVE SEED LOCALIZATION;  Surgeon: Erroll Luna, MD;  Location: [place];  Service:  General;  Laterality: Left;  ? [place]  ? rt br bx  ? CARDIAC CATHETERIZATION  2011  ? ablasion  ? CARDIOVERSION N/A 08/10/2017  ? Procedure: CARDIOVERSION;  Surgeon: Jerline Pain, MD;  Location: [facility]COPY;  Service: Cardiovascular;  Laterality: N/A;  ? COLONOSCOPY    ? DILATION AND CURETTAGE OF UTERUS    ? Left Breast Biopsy  01/15/15  ? PACEMAKER IMPLANT N/A 08/17/2017  ? Procedure: Pacemaker Implant;  Surgeon: Evans Lance, MD;  Location: [place] [facility];  Service: Cardiovascular;  Laterality: N/A;  ? TUBAL LIGATION    ? ?Patient Active Problem List  ? Diagnosis Date Noted  ? Chronic diastolic heart failure ([place]) 03/27/2021  ? PVC's (premature ventricular contractions) 09/23/2020  ? Pacemaker 01/10/2020  ? Pain in left knee 09/27/2018  ? Acute diastolic CHF (congestive heart failure) ([place]) 08/11/2017  ? Acute CHF (congestive heart failure) ([place]) 08/11/2017  ? Acute CHF ([place]) 08/11/2017  ? Persistent atrial fibrillation ([place])   ? ILD (interstitial lung disease) ([place]) 02/21/2017  ? CHF (congestive heart failure) ([place]) 02/21/2017  ? Dyspnea and respiratory abnormality 02/11/2017  ? Atrial flutter ([place]) 02/04/2017  ? Chronic atrial fibrillation ([place]) 01/19/2017  ? Abnormality of gait and mobility 01/19/2017  ? Hypothyroidism 01/19/2017  ? Other specified disorders of bone density and structure, unspecified site 01/19/2017  ?  Genetic testing 03/13/2015  ? Paresthesia of skin 03/03/2015  ? Breast cancer of lower-outer quadrant of left female breast ([place]) 02/06/2015  ? Hematuria   ? PAF (paroxysmal atrial fibrillation) ([place])   ? Chronic venous insufficiency   ? SVT (supraventricular tachycardia) ([place]) 06/12/2010  ? Essential hypertension 04/22/2010  ? MITRAL VALVE PROLAPSE 04/22/2010  ? Atypical atrial flutter ([place]) 04/22/2010  ? SUPRAVENTRICULAR TACHYCARDIA 04/22/2010  ? ? ?PCP: Tracy Manes, MD ? ?REFERRING PROVIDER: Lajean Manes, MD ? ?REFERRING DIAG: R26.9 (ICD-10-CM) - Unspecified  abnormalities of gait and mobility ? ?THERAPY DIAG:  ?Muscle weakness (generalized) ? ?Difficulty walking ? ?Other abnormalities of gait and mobility ? ?ONSET DATE: 2020 ? ?SUBJECTIVE:  ? ?SUBJECTIVE STATEMENT: ?Pt states she is doing well. She is up to walking 15 mins at a time now. She did not have any extra pain after last session.  ? ?PERTINENT HISTORY: ?Pacemaker, CHF, venous insufficiency, dermatitis ? ?PAIN:  ?Are you having pain? No ? ?PRECAUTIONS: NO ESTIM ? ?WEIGHT BEARING RESTRICTIONS No ? ?FALLS:  ?Has patient fallen in last 6 months? Yes, Number of falls: 1, fell trying ot get out of chair, had to crawl in order to get up, unable to get up from the floor ? ?LIVING ENVIRONMENT: ?Lives with: lives with their family ?Lives in: House/apartment ?Stairs: Yes;  ?Has following equipment at home: None ? ?OCCUPATION: retired ? ?PLOF: Independent with basic ADLs ? ?PATIENT GOALS : Pt would like to strengthen her legs.  ? ? ?OBJECTIVE:  ? ? ?PATIENT SURVEYS:  ?ABC 60% ? ?TODAY'S TREATMENT: ?03/20/22 ?Exercises ?  Nustep L3: 17mn (legs only) ?  5x5 STS no UE from raised table, focus on power concentric, slow eccentric ?   ?At counter:  ? Step up 4" box 2x10  ? Sidestepping at bar 3s laps ?Heel/toe Raises x 10 reps x 2 ?Standing gastroc stretch 30s 2x ?  Semi tandem stance x 15 sec with intermittent UE to steady ?  Retro walking 3x laps (intermittent UE) ?Fwd and lateral lunge with UE support 10x each ? ?03/19/22 ?Exercises ?  Nustep L3: 662m (legs only) ?  Sit to/from stand from elevated table.  Eccentric lowering. X 10 (arms outstretched) x 2 sets  (split up during session) ?  Seated resisted clams GTB x 10 rep, 5 sec hold, 2 sets   ?  Supine bridge with 5# on lap x 10 x 2 ?  LAQ 4lbs 10 each ?At counter:  ?Heel/toe Raises x 10 reps x 2 ?  Semi tandem stance x 15 sec with intermittent UE to steady ?  SLS with opp toe taps front, side back with light UE support on counter (CGA for safety)x 5 each side ?Standing Hip  Abduction x10  ?Mini split squat with back knee touching airex pad on top of tall box x 5 each side, UE on counter ? ?PATIENT EDUCATION:  ?Education details: walking program, exercise progression, DOMS expectations, muscle firing,  envelope of function, HEP, POC ? ?Person educated: Patient ?Education method: Explanation, Demonstration, Tactile cues, Verbal cues, and Handouts ?Education comprehension: verbalized understanding, returned demonstration, verbal cues required, and tactile cues required ? ? ?HOME EXERCISE PROGRAM: ?Access Code: [email or web address]: [email or web address] ?Date: 03/03/2022 ?Prepared by: AlDaleen Bo ?ASSESSMENT: ? ?CLINICAL IMPRESSION: ?Pt able to continue with progression of LE exercise. Pt still with difficulty generating enough force during STS exercise so therapy trial power emphasis with STS in order to facilitate neuro-potentiation. Pt able to continue with LE strength and  balance without discomfort or pain. Pt did have difficulty with step up and requires UE assist and SBA. Unilateral leg exercises added in in order to try and address LE deficits in CKC. Plan to continue with LE strength and balance as tolerated. Work towards STS from [employer] chair without UE if able and general endurance.  Pt would benefit from continued skilled therapy in order to reach goals and maximize functional strength and ROM for prevention of falls.  ? ? ?OBJECTIVE IMPAIRMENTS Abnormal gait, decreased activity tolerance, decreased balance, decreased coordination, decreased endurance, decreased mobility, difficulty walking, decreased ROM, decreased strength, hypomobility, impaired flexibility, improper body mechanics, and postural dysfunction.  ? ?ACTIVITY LIMITATIONS cleaning, community activity, driving, occupation, yard work, shopping, and exercise .  ? ?PERSONAL FACTORS Age, Behavior pattern, Fitness, Time since onset of injury/illness/exacerbation, and 3+ comorbidities:    are also affecting patient's  functional outcome.  ? ? ?REHAB POTENTIAL: Fair   ? ?CLINICAL DECISION MAKING: Evolving/moderate complexity ? ?EVALUATION COMPLEXITY: Moderate ? ? ?GOALS: ? ? ?SHORT TERM GOALS: Target date: 05/11/2022 ? ?Pt will become in

## 2022-03-31 DIAGNOSIS — N179 Acute kidney failure, unspecified: Secondary | ICD-10-CM | POA: Diagnosis not present

## 2022-03-31 DIAGNOSIS — R040 Epistaxis: Secondary | ICD-10-CM | POA: Diagnosis not present

## 2022-03-31 DIAGNOSIS — N289 Disorder of kidney and ureter, unspecified: Secondary | ICD-10-CM | POA: Diagnosis not present

## 2022-04-01 ENCOUNTER — Encounter (HOSPITAL_BASED_OUTPATIENT_CLINIC_OR_DEPARTMENT_OTHER): Payer: Self-pay | Admitting: Physical Therapy

## 2022-04-01 ENCOUNTER — Ambulatory Visit (HOSPITAL_BASED_OUTPATIENT_CLINIC_OR_DEPARTMENT_OTHER): Payer: Medicare Other | Admitting: Physical Therapy

## 2022-04-01 DIAGNOSIS — M6281 Muscle weakness (generalized): Secondary | ICD-10-CM | POA: Diagnosis not present

## 2022-04-01 DIAGNOSIS — R2689 Other abnormalities of gait and mobility: Secondary | ICD-10-CM

## 2022-04-01 DIAGNOSIS — R262 Difficulty in walking, not elsewhere classified: Secondary | ICD-10-CM

## 2022-04-01 NOTE — Therapy (Signed)
?OUTPATIENT PHYSICAL THERAPY LOWER EXTREMITY TREATMENT ? ? ?Patient Name: [name] ?MRN: [id number] ?DOB:03-24-1933, 88 y.o., female ?Today's Date: 04/01/2022 ? ? PT End of Session - 04/01/22 1020   ? ? Visit Number 7   ? Number of Visits 21   ? Date for PT Re-Evaluation 06/01/22   ? Authorization Type UHC MCR   ? PT Start Time 1015   ? PT Stop Time 1055   ? PT Time Calculation (min) 40 min   ? Activity Tolerance Patient tolerated treatment well   ? Behavior During Therapy [facility] for tasks assessed/performed   ? ?  ?  ? ?  ? ? ? ? ? ?Past Medical History:  ?Diagnosis Date  ? Allergic rhinitis   ? Arthritis   ? bil knees  ? Breast cancer, left breast ([place]) 01/15/2015  ? Invasive Mammary  ? Chronic venous insufficiency   ? Hematuria   ? negative workup - Dr. Reece Agar  ? Hypertension   ? Hypothyroidism   ? Migraine headache   ? MVP (mitral valve prolapse)   ? OAB (overactive bladder)   ? Pacemaker   ? PAF (paroxysmal atrial fibrillation) ([place])   ? SVT (supraventricular tachycardia) ([place]) 06/2010  ? s/p AVNRT ablation  ? Wears glasses   ? ?Past Surgical History:  ?Procedure Laterality Date  ? A-FLUTTER ABLATION N/A 02/04/2017  ? Procedure: A-Flutter Ablation;  Surgeon: Evans Lance, MD;  Location: [place] [facility];  Service: Cardiovascular;  Laterality: N/A;  ? ABDOMINAL HYSTERECTOMY    ? AV NODE ABLATION N/A 08/17/2017  ? Procedure: AV Node Ablation;  Surgeon: Evans Lance, MD;  Location: [name] [facility];  Service: Cardiovascular;  Laterality: N/A;  ? BREAST LUMPECTOMY WITH RADIOACTIVE SEED LOCALIZATION Left 02/14/2015  ? Procedure: BREAST LUMPECTOMY WITH RADIOACTIVE SEED LOCALIZATION;  Surgeon: Alphonsa Overall, MD;  Location: [place];  Service: General;  Laterality: Left;  ? BREAST LUMPECTOMY WITH RADIOACTIVE SEED LOCALIZATION Left 11/18/2021  ? Procedure: LEFT BREAST LUMPECTOMY WITH RADIOACTIVE SEED LOCALIZATION;  Surgeon: Erroll Luna, MD;  Location: [place];  Service:  General;  Laterality: Left;  ? [place]  ? rt br bx  ? CARDIAC CATHETERIZATION  2011  ? ablasion  ? CARDIOVERSION N/A 08/10/2017  ? Procedure: CARDIOVERSION;  Surgeon: Jerline Pain, MD;  Location: [facility] ENDOSCOPY;  Service: Cardiovascular;  Laterality: N/A;  ? COLONOSCOPY    ? DILATION AND CURETTAGE OF UTERUS    ? Left Breast Biopsy  01/15/15  ? PACEMAKER IMPLANT N/A 08/17/2017  ? Procedure: Pacemaker Implant;  Surgeon: Evans Lance, MD;  Location: [place] [facility];  Service: Cardiovascular;  Laterality: N/A;  ? TUBAL LIGATION    ? ?Patient Active Problem List  ? Diagnosis Date Noted  ? Chronic diastolic heart failure ([place]) 03/27/2021  ? PVC's (premature ventricular contractions) 09/23/2020  ? Pacemaker 01/10/2020  ? Pain in left knee 09/27/2018  ? Acute diastolic CHF (congestive heart failure) ([place]) 08/11/2017  ? Acute CHF (congestive heart failure) ([place]) 08/11/2017  ? Acute CHF ([place]) 08/11/2017  ? Persistent atrial fibrillation ([place])   ? ILD (interstitial lung disease) ([place]) 02/21/2017  ? CHF (congestive heart failure) ([place]) 02/21/2017  ? Dyspnea and respiratory abnormality 02/11/2017  ? Atrial flutter ([place]) 02/04/2017  ? Chronic atrial fibrillation ([place]) 01/19/2017  ? Abnormality of gait and mobility 01/19/2017  ? Hypothyroidism 01/19/2017  ? Other specified disorders of bone density and structure, unspecified site 01/19/2017  ?  Genetic testing 03/13/2015  ? Paresthesia of skin 03/03/2015  ? Breast cancer of lower-outer quadrant of left female breast ([place]) 02/06/2015  ? Hematuria   ? PAF (paroxysmal atrial fibrillation) ([place])   ? Chronic venous insufficiency   ? SVT (supraventricular tachycardia) ([place]) 06/12/2010  ? Essential hypertension 04/22/2010  ? MITRAL VALVE PROLAPSE 04/22/2010  ? Atypical atrial flutter ([place]) 04/22/2010  ? SUPRAVENTRICULAR TACHYCARDIA 04/22/2010  ? ? ?PCP: Lajean Manes, MD ? ?REFERRING PROVIDER: Lajean Manes, MD ? ?REFERRING DIAG: R26.9 (ICD-10-CM) - Unspecified  abnormalities of gait and mobility ? ?THERAPY DIAG:  ?Muscle weakness (generalized) ? ?Difficulty walking ? ?Other abnormalities of gait and mobility ? ?ONSET DATE: 2020 ? ?SUBJECTIVE:  ? ?SUBJECTIVE STATEMENT: ?Pt states she is doing well. She states certain areas are sore from working out.   ? ?PERTINENT HISTORY: ?Pacemaker, CHF, venous insufficiency, dermatitis ? ?PAIN:  ?Are you having pain? No ? ?PRECAUTIONS: NO ESTIM ? ?WEIGHT BEARING RESTRICTIONS No ? ?FALLS:  ?Has patient fallen in last 6 months? Yes, Number of falls: 1, fell trying ot get out of chair, had to crawl in order to get up, unable to get up from the floor ? ?LIVING ENVIRONMENT: ?Lives with: lives with their family ?Lives in: House/apartment ?Stairs: Yes;  ?Has following equipment at home: None ? ?OCCUPATION: retired ? ?PLOF: Independent with basic ADLs ? ?PATIENT GOALS : Pt would like to strengthen her legs.  ? ? ?OBJECTIVE:  ? ? ?PATIENT SURVEYS:  ?ABC 60% ? ?TODAY'S TREATMENT: ? ?04/01/22 ?Exercises ?  Nustep L3: 40mn (legs only) ?  5x5 STS no UE from raised table, focus on power concentric, slow eccentric; 2" block under L LE ? ?  L LE LAQ 5lbs 3x10 ? ?At counter:  ? Step up 4" box 2x10  ? Sidestepping at bar 3s laps YTB at knees (no UE) ?Heel/toe Raises x 10 reps x 2 ?Standing gastroc stretch 30s 2x ?  Semi tandem stance x 15 sec with intermittent UE to steady ?  Retro walking 3x laps (intermittent UE) ?Fwd and lateral lunge with UE support 10x each ? ?03/20/22 ?Exercises ?  Nustep L3: 765m (legs only) ?  5x5 STS no UE from raised table, focus on power concentric, slow eccentric ?   ?At counter:  ? Step up 4" box 2x10  ? Sidestepping at bar 3s laps ?Heel/toe Raises x 10 reps x 2 ?Standing gastroc stretch 30s 2x ?  Semi tandem stance x 15 sec with intermittent UE to steady ?  Retro walking 3x laps (intermittent UE) ?Fwd and lateral lunge with UE support 10x each ? ?03/19/22 ?Exercises ?  Nustep L3: 19m65m(legs only) ?  Sit to/from stand from  elevated table.  Eccentric lowering. X 10 (arms outstretched) x 2 sets  (split up during session) ?  Seated resisted clams GTB x 10 rep, 5 sec hold, 2 sets   ?  Supine bridge with 5# on lap x 10 x 2 ?  LAQ 4lbs 10 each ?At counter:  ?Heel/toe Raises x 10 reps x 2 ?  Semi tandem stance x 15 sec with intermittent UE to steady ?  SLS with opp toe taps front, side back with light UE support on counter (CGA for safety)x 5 each side ?Standing Hip Abduction x10  ?Mini split squat with back knee touching airex pad on top of tall box x 5 each side, UE on counter ? ?PATIENT EDUCATION:  ?Education details: walking program, exercise progression, DOMS expectations, muscle firing,  envelope of function, HEP, POC ? ?Person educated: Patient ?Education method: Explanation, Demonstration, Tactile cues, Verbal cues, and Handouts ?Education comprehension: verbalized understanding, returned demonstration, verbal cues required, and tactile cues required ? ? ?HOME EXERCISE PROGRAM: ?Access Code: [email or web address] ?URL: [email or web address] ?Date: 03/03/2022 ?Prepared by: Daleen Bo ? ?ASSESSMENT: ? ?CLINICAL IMPRESSION: ?Pt able to increase intensity of loading with exercise a today's session with focus the L LE strength for improvement in functional mobility. Pt with less intermittent UE assist at toady's session with dynamic gait/ balance exercise. CKC strength still at a deficit with L>R with min UE support needed. Work towards STS from [employer] chair without UE if able and general endurance.  Pt would benefit from continued skilled therapy in order to reach goals and maximize functional strength and ROM for prevention of falls.  ? ? ?OBJECTIVE IMPAIRMENTS Abnormal gait, decreased activity tolerance, decreased balance, decreased coordination, decreased endurance, decreased mobility, difficulty walking, decreased ROM, decreased strength, hypomobility, impaired flexibility, improper body mechanics, and postural dysfunction.  ? ?ACTIVITY  LIMITATIONS cleaning, community activity, driving, occupation, yard work, shopping, and exercise .  ? ?PERSONAL FACTORS Age, Behavior pattern, Fitness, Time since onset of injury/illness/exacerbation, and 3+ comorbidities:

## 2022-04-06 ENCOUNTER — Encounter (HOSPITAL_BASED_OUTPATIENT_CLINIC_OR_DEPARTMENT_OTHER): Payer: Self-pay | Admitting: Physical Therapy

## 2022-04-06 ENCOUNTER — Ambulatory Visit (HOSPITAL_BASED_OUTPATIENT_CLINIC_OR_DEPARTMENT_OTHER): Payer: Medicare Other | Admitting: Physical Therapy

## 2022-04-06 DIAGNOSIS — M6281 Muscle weakness (generalized): Secondary | ICD-10-CM | POA: Diagnosis not present

## 2022-04-06 DIAGNOSIS — R262 Difficulty in walking, not elsewhere classified: Secondary | ICD-10-CM | POA: Diagnosis not present

## 2022-04-06 DIAGNOSIS — R2689 Other abnormalities of gait and mobility: Secondary | ICD-10-CM

## 2022-04-06 NOTE — Therapy (Signed)
?OUTPATIENT PHYSICAL THERAPY LOWER EXTREMITY TREATMENT ? ? ?Patient Name: [name] ?MRN: [id number] ?DOB:07-06-1933, 88 y.o., female ?Today's Date: 04/06/2022 ? ? PT End of Session - 04/06/22 1059   ? ? Visit Number 8   ? Number of Visits 21   ? Date for PT Re-Evaluation 06/01/22   ? Authorization Type UHC MCR   ? PT Start Time 1100   ? PT Stop Time 1140   ? PT Time Calculation (min) 40 min   ? Activity Tolerance Patient tolerated treatment well   ? Behavior During Therapy [facility] for tasks assessed/performed   ? ?  ?  ? ?  ? ? ? ? ? ?Past Medical History:  ?Diagnosis Date  ? Allergic rhinitis   ? Arthritis   ? bil knees  ? Breast cancer, left breast ([place]) 01/15/2015  ? Invasive Mammary  ? Chronic venous insufficiency   ? Hematuria   ? negative workup - Dr. Reece Agar  ? Hypertension   ? Hypothyroidism   ? Migraine headache   ? MVP (mitral valve prolapse)   ? OAB (overactive bladder)   ? Pacemaker   ? PAF (paroxysmal atrial fibrillation) ([place])   ? SVT (supraventricular tachycardia) ([place]) 06/2010  ? s/p AVNRT ablation  ? Wears glasses   ? ?Past Surgical History:  ?Procedure Laterality Date  ? A-FLUTTER ABLATION N/A 02/04/2017  ? Procedure: A-Flutter Ablation;  Surgeon: Evans Lance, MD;  Location: [place] [facility];  Service: Cardiovascular;  Laterality: N/A;  ? ABDOMINAL HYSTERECTOMY    ? AV NODE ABLATION N/A 08/17/2017  ? Procedure: AV Node Ablation;  Surgeon: Evans Lance, MD;  Location: [place] [facility];  Service: Cardiovascular;  Laterality: N/A;  ? BREAST LUMPECTOMY WITH RADIOACTIVE SEED LOCALIZATION Left 02/14/2015  ? Procedure: BREAST LUMPECTOMY WITH RADIOACTIVE SEED LOCALIZATION;  Surgeon: Alphonsa Overall, MD;  Location: [place];  Service: General;  Laterality: Left;  ? BREAST LUMPECTOMY WITH RADIOACTIVE SEED LOCALIZATION Left 11/18/2021  ? Procedure: LEFT BREAST LUMPECTOMY WITH RADIOACTIVE SEED LOCALIZATION;  Surgeon: Erroll Luna, MD;  Location: [place];  Service:  General;  Laterality: Left;  ? [place]  ? rt br bx  ? CARDIAC CATHETERIZATION  2011  ? ablasion  ? CARDIOVERSION N/A 08/10/2017  ? Procedure: CARDIOVERSION;  Surgeon: Jerline Pain, MD;  Location: [facility] ENDOSCOPY;  Service: Cardiovascular;  Laterality: N/A;  ? COLONOSCOPY    ? DILATION AND CURETTAGE OF UTERUS    ? Left Breast Biopsy  01/15/15  ? PACEMAKER IMPLANT N/A 08/17/2017  ? Procedure: Pacemaker Implant;  Surgeon: Evans Lance, MD;  Location: [place] [facility];  Service: Cardiovascular;  Laterality: N/A;  ? TUBAL LIGATION    ? ?Patient Active Problem List  ? Diagnosis Date Noted  ? Chronic diastolic heart failure ([place]) 03/27/2021  ? PVC's (premature ventricular contractions) 09/23/2020  ? Pacemaker 01/10/2020  ? Pain in left knee 09/27/2018  ? Acute diastolic CHF (congestive heart failure) ([place]) 08/11/2017  ? Acute CHF (congestive heart failure) ([place]) 08/11/2017  ? Acute CHF ([place]) 08/11/2017  ? Persistent atrial fibrillation ([place])   ? ILD (interstitial lung disease) ([place]) 02/21/2017  ? CHF (congestive heart failure) ([place]) 02/21/2017  ? Dyspnea and respiratory abnormality 02/11/2017  ? Atrial flutter ([place]) 02/04/2017  ? Chronic atrial fibrillation ([place]) 01/19/2017  ? Abnormality of gait and mobility 01/19/2017  ? Hypothyroidism 01/19/2017  ? Other specified disorders of bone density and structure, unspecified site 01/19/2017  ?  Genetic testing 03/13/2015  ? Paresthesia of skin 03/03/2015  ? Breast cancer of lower-outer quadrant of left female breast ([place]) 02/06/2015  ? Hematuria   ? PAF (paroxysmal atrial fibrillation) ([place])   ? Chronic venous insufficiency   ? SVT (supraventricular tachycardia) ([place]) 06/12/2010  ? Essential hypertension 04/22/2010  ? MITRAL VALVE PROLAPSE 04/22/2010  ? Atypical atrial flutter ([place]) 04/22/2010  ? SUPRAVENTRICULAR TACHYCARDIA 04/22/2010  ? ? ?PCP: Lajean Manes, MD ? ?REFERRING PROVIDER: Lajean Manes, MD ? ?REFERRING DIAG: R26.9 (ICD-10-CM) - Unspecified  abnormalities of gait and mobility ? ?THERAPY DIAG:  ?Muscle weakness (generalized) ? ?Difficulty walking ? ?Other abnormalities of gait and mobility ? ?ONSET DATE: 2020 ? ?SUBJECTIVE:  ? ?SUBJECTIVE STATEMENT: ?Pt states she is doing well. She she was not overly sore but has had some back pain that is a chronic problem.  ? ?PERTINENT HISTORY: ?Pacemaker, CHF, venous insufficiency, dermatitis ? ?PAIN:  ?Are you having pain? No ? ?PRECAUTIONS: NO ESTIM ? ?WEIGHT BEARING RESTRICTIONS No ? ?FALLS:  ?Has patient fallen in last 6 months? Yes, Number of falls: 1, fell trying ot get out of chair, had to crawl in order to get up, unable to get up from the floor ? ?LIVING ENVIRONMENT: ?Lives with: lives with their family ?Lives in: House/apartment ?Stairs: Yes;  ?Has following equipment at home: None ? ?OCCUPATION: retired ? ?PLOF: Independent with basic ADLs ? ?PATIENT GOALS : Pt would like to strengthen her legs.  ? ? ?OBJECTIVE:  ? ? ?PATIENT SURVEYS:  ?ABC 60% ? ?TODAY'S TREATMENT: ?04/06/22 ?Exercises ?  Nustep L3: 72mn (legs only) ?  5x5 STS no UE from raised table, 4lb weight ?  Shuttle leg press 50lbs 2x10 ?  L LE LAQ 5lbs 3x10 ? ?At counter:  ? Step up 4" box 2x10  ? Sidestepping at raised table 4x laps RTB at knees (no UE) ?Heel/toe Raises x 10 reps x 2 ? lateral lunge with UE support 10x each ? ?04/01/22 ?Exercises ?  Nustep L3: 783m (legs only) ?  5x5 STS no UE from raised table, focus on power concentric, slow eccentric; 2" block under L LE ? ?  L LE LAQ 5lbs 3x10 ? ?At counter:  ? Step up 4" box 2x10  ? Sidestepping at bar 3s laps YTB at knees (no UE) ?Heel/toe Raises x 10 reps x 2 ?Standing gastroc stretch 30s 2x ?  Semi tandem stance x 15 sec with intermittent UE to steady ?  Retro walking 3x laps (intermittent UE) ?Fwd and lateral lunge with UE support 10x each ? ?03/20/22 ?Exercises ?  Nustep L3: 36m83m(legs only) ?  5x5 STS no UE from raised table, focus on power concentric, slow eccentric ?   ?At counter:   ? Step up 4" box 2x10  ? Sidestepping at bar 3s laps ?Heel/toe Raises x 10 reps x 2 ?Standing gastroc stretch 30s 2x ?  Semi tandem stance x 15 sec with intermittent UE to steady ?  Retro walking 3x laps (intermittent UE) ?Fwd and lateral lunge with UE support 10x each ? ?03/19/22 ?Exercises ?  Nustep L3: 6mi62mlegs only) ?  Sit to/from stand from elevated table.  Eccentric lowering. X 10 (arms outstretched) x 2 sets  (split up during session) ?  Seated resisted clams GTB x 10 rep, 5 sec hold, 2 sets   ?  Supine bridge with 5# on lap x 10 x 2 ?  LAQ 4lbs 10 each ?At counter:  ?Heel/toe Raises x 10  reps x 2 ?  Semi tandem stance x 15 sec with intermittent UE to steady ?  SLS with opp toe taps front, side back with light UE support on counter (CGA for safety)x 5 each side ?Standing Hip Abduction x10  ?Mini split squat with back knee touching airex pad on top of tall box x 5 each side, UE on counter ? ?PATIENT EDUCATION:  ?Education details: walking program, exercise progression, DOMS expectations, muscle firing,  envelope of function, HEP, POC ? ?Person educated: Patient ?Education method: Explanation, Demonstration, Tactile cues, Verbal cues, and Handouts ?Education comprehension: verbalized understanding, returned demonstration, verbal cues required, and tactile cues required ? ? ?HOME EXERCISE PROGRAM: ?Access Code: [email or web address] ?URL: [email or web address] ?Date: 03/03/2022 ?Prepared by: Daleen Bo ? ?ASSESSMENT: ? ?CLINICAL IMPRESSION: ?Session focused mostly on strength today as pt is concerned with her ability to get out of chairs. Pt was able to increase intensity of loading with STS at elevated surface as well more targeted focus with LE strength. Pt does have stiffness/joint mobility deficits that reduce her amount of hip flexion for easier transfer mechanics. Pt did not have pain during session and was able to reduce UE support with standing exercise. Plan to continue with progression of strength,  mobility, and balance. Trial gait with head turns at next session.  Pt would benefit from continued skilled therapy in order to reach goals and maximize functional strength and ROM for prevention of falls.  ? ? ?OBJECTIVE

## 2022-04-09 ENCOUNTER — Ambulatory Visit (HOSPITAL_BASED_OUTPATIENT_CLINIC_OR_DEPARTMENT_OTHER): Payer: Medicare Other | Admitting: Physical Therapy

## 2022-04-09 ENCOUNTER — Encounter (HOSPITAL_BASED_OUTPATIENT_CLINIC_OR_DEPARTMENT_OTHER): Payer: Self-pay | Admitting: Physical Therapy

## 2022-04-09 DIAGNOSIS — R2689 Other abnormalities of gait and mobility: Secondary | ICD-10-CM | POA: Diagnosis not present

## 2022-04-09 DIAGNOSIS — R262 Difficulty in walking, not elsewhere classified: Secondary | ICD-10-CM

## 2022-04-09 DIAGNOSIS — M6281 Muscle weakness (generalized): Secondary | ICD-10-CM

## 2022-04-09 NOTE — Therapy (Signed)
?OUTPATIENT PHYSICAL THERAPY LOWER EXTREMITY TREATMENT ? ? ?Patient Name: [name] ?MRN: [id number] ?DOB:Apr 28, 1933, 88 y.o., female ?Today's Date: 04/09/2022 ? ? PT End of Session - 04/09/22 1058   ? ? Visit Number 9   ? Number of Visits 21   ? Date for PT Re-Evaluation 06/01/22   ? Authorization Type UHC MCR   ? PT Start Time 1100   ? PT Stop Time 1140   ? PT Time Calculation (min) 40 min   ? Activity Tolerance Patient tolerated treatment well   ? Behavior During Therapy [facility] for tasks assessed/performed   ? ?  ?  ? ?  ? ? ? ? ? ?Past Medical History:  ?Diagnosis Date  ? Allergic rhinitis   ? Arthritis   ? bil knees  ? Breast cancer, left breast ([place]) 01/15/2015  ? Invasive Mammary  ? Chronic venous insufficiency   ? Hematuria   ? negative workup - Dr. Reece Agar  ? Hypertension   ? Hypothyroidism   ? Migraine headache   ? MVP (mitral valve prolapse)   ? OAB (overactive bladder)   ? Pacemaker   ? PAF (paroxysmal atrial fibrillation) ([place])   ? SVT (supraventricular tachycardia) ([place]) 06/2010  ? s/p AVNRT ablation  ? Wears glasses   ? ?Past Surgical History:  ?Procedure Laterality Date  ? A-FLUTTER ABLATION N/A 02/04/2017  ? Procedure: A-Flutter Ablation;  Surgeon: Evans Lance, MD;  Location: [place] [facility];  Service: Cardiovascular;  Laterality: N/A;  ? ABDOMINAL HYSTERECTOMY    ? AV NODE ABLATION N/A 08/17/2017  ? Procedure: AV Node Ablation;  Surgeon: Evans Lance, MD;  Location: [place] [facility];  Service: Cardiovascular;  Laterality: N/A;  ? BREAST LUMPECTOMY WITH RADIOACTIVE SEED LOCALIZATION Left 02/14/2015  ? Procedure: BREAST LUMPECTOMY WITH RADIOACTIVE SEED LOCALIZATION;  Surgeon: Alphonsa Overall, MD;  Location: [place];  Service: General;  Laterality: Left;  ? BREAST LUMPECTOMY WITH RADIOACTIVE SEED LOCALIZATION Left 11/18/2021  ? Procedure: LEFT BREAST LUMPECTOMY WITH RADIOACTIVE SEED LOCALIZATION;  Surgeon: Erroll Luna, MD;  Location: [place];  Service:  General;  Laterality: Left;  ? [place]  ? rt br bx  ? CARDIAC CATHETERIZATION  2011  ? ablasion  ? CARDIOVERSION N/A 08/10/2017  ? Procedure: CARDIOVERSION;  Surgeon: Jerline Pain, MD;  Location: [facility] ENDOSCOPY;  Service: Cardiovascular;  Laterality: N/A;  ? COLONOSCOPY    ? DILATION AND CURETTAGE OF UTERUS    ? Left Breast Biopsy  01/15/15  ? PACEMAKER IMPLANT N/A 08/17/2017  ? Procedure: Pacemaker Implant;  Surgeon: Evans Lance, MD;  Location: [place] [facility];  Service: Cardiovascular;  Laterality: N/A;  ? TUBAL LIGATION    ? ?Patient Active Problem List  ? Diagnosis Date Noted  ? Chronic diastolic heart failure ([place]) 03/27/2021  ? PVC's (premature ventricular contractions) 09/23/2020  ? Pacemaker 01/10/2020  ? Pain in left knee 09/27/2018  ? Acute diastolic CHF (congestive heart failure) ([place]) 08/11/2017  ? Acute CHF (congestive heart failure) ([place]) 08/11/2017  ? Acute CHF ([place]) 08/11/2017  ? Persistent atrial fibrillation ([place])   ? ILD (interstitial lung disease) ([place]) 02/21/2017  ? CHF (congestive heart failure) ([place]) 02/21/2017  ? Dyspnea and respiratory abnormality 02/11/2017  ? Atrial flutter ([place]) 02/04/2017  ? Chronic atrial fibrillation ([place]) 01/19/2017  ? Abnormality of gait and mobility 01/19/2017  ? Hypothyroidism 01/19/2017  ? Other specified disorders of bone density and structure, unspecified site 01/19/2017  ?  Genetic testing 03/13/2015  ? Paresthesia of skin 03/03/2015  ? Breast cancer of lower-outer quadrant of left female breast ([place]) 02/06/2015  ? Hematuria   ? PAF (paroxysmal atrial fibrillation) ([place])   ? Chronic venous insufficiency   ? SVT (supraventricular tachycardia) ([place]) 06/12/2010  ? Essential hypertension 04/22/2010  ? MITRAL VALVE PROLAPSE 04/22/2010  ? Atypical atrial flutter ([place]) 04/22/2010  ? SUPRAVENTRICULAR TACHYCARDIA 04/22/2010  ? ? ?PCP: Lajean Manes, MD ? ?REFERRING PROVIDER: Lajean Manes, MD ? ?REFERRING DIAG: R26.9 (ICD-10-CM) - Unspecified  abnormalities of gait and mobility ? ?THERAPY DIAG:  ?Muscle weakness (generalized) ? ?Difficulty walking ? ?Other abnormalities of gait and mobility ? ?ONSET DATE: 2020 ? ?SUBJECTIVE:  ? ?SUBJECTIVE STATEMENT: ?Pt states she had some knee soreness after last session due to her OA but no pain today.  ? ?PERTINENT HISTORY: ?Pacemaker, CHF, venous insufficiency, dermatitis ? ?PAIN:  ?Are you having pain? No ? ?PRECAUTIONS: NO ESTIM ? ?WEIGHT BEARING RESTRICTIONS No ? ?FALLS:  ?Has patient fallen in last 6 months? Yes, Number of falls: 1, fell trying ot get out of chair, had to crawl in order to get up, unable to get up from the floor ? ?LIVING ENVIRONMENT: ?Lives with: lives with their family ?Lives in: House/apartment ?Stairs: Yes;  ?Has following equipment at home: None ? ?OCCUPATION: retired ? ?PLOF: Independent with basic ADLs ? ?PATIENT GOALS : Pt would like to strengthen her legs.  ? ? ?OBJECTIVE:  ? ? ?PATIENT SURVEYS:  ?ABC 60% ? ?TODAY'S TREATMENT: ? ?4/28 ?Exercises ?  Nustep L3: 5mn (legs only) ?  5x5 STS no UE from raised table, 4lb weight ?  Suitcase carry 4lbs 783f3 laps ?  Gait with head turns 7577fx laps ?  L LE LAQ 6lbs 3x10 ? ?At counter:  ? Step up 4" box 2x10  ? Sidestepping at raised table 4x laps RTB at knees (no UE) ? ? ?04/06/22 ?Exercises ?  Nustep L3: 7mi65mlegs only) ?  5x5 STS no UE from raised table, 4lb weight ?  Shuttle leg press 50lbs 2x10 ?  L LE LAQ 5lbs 3x10 ? ?At counter:  ? Step up 4" box 2x10  ? Sidestepping at raised table 4x laps RTB at knees (no UE) ?Heel/toe Raises x 10 reps x 2 ? lateral lunge with UE support 10x each ? ?04/01/22 ?Exercises ?  Nustep L3: 7min70megs only) ?  5x5 STS no UE from raised table, focus on power concentric, slow eccentric; 2" block under L LE ? ?  L LE LAQ 5lbs 3x10 ? ?At counter:  ? Step up 4" box 2x10  ? Sidestepping at bar 3s laps YTB at knees (no UE) ?Heel/toe Raises x 10 reps x 2 ?Standing gastroc stretch 30s 2x ?  Semi tandem stance x 15 sec  with intermittent UE to steady ?  Retro walking 3x laps (intermittent UE) ?Fwd and lateral lunge with UE support 10x each ? ?03/20/22 ?Exercises ?  Nustep L3: 7min 49mgs only) ?  5x5 STS no UE from raised table, focus on power concentric, slow eccentric ?   ?At counter:  ? Step up 4" box 2x10  ? Sidestepping at bar 3s laps ?Heel/toe Raises x 10 reps x 2 ?Standing gastroc stretch 30s 2x ?  Semi tandem stance x 15 sec with intermittent UE to steady ?  Retro walking 3x laps (intermittent UE) ?Fwd and lateral lunge with UE support 10x each ? ?03/19/22 ?Exercises ?  Nustep L3: 6min (52ms only) ?  Sit to/from stand from elevated table.  Eccentric lowering. X 10 (arms outstretched) x 2 sets  (split up during session) ?  Seated resisted clams GTB x 10 rep, 5 sec hold, 2 sets   ?  Supine bridge with 5# on lap x 10 x 2 ?  LAQ 4lbs 10 each ?At counter:  ?Heel/toe Raises x 10 reps x 2 ?  Semi tandem stance x 15 sec with intermittent UE to steady ?  SLS with opp toe taps front, side back with light UE support on counter (CGA for safety)x 5 each side ?Standing Hip Abduction x10  ?Mini split squat with back knee touching airex pad on top of tall box x 5 each side, UE on counter ? ?PATIENT EDUCATION:  ?Education details: walking program, exercise progression, DOMS expectations, muscle firing,  envelope of function, HEP, POC ? ?Person educated: Patient ?Education method: Explanation, Demonstration, Tactile cues, Verbal cues, and Handouts ?Education comprehension: verbalized understanding, returned demonstration, verbal cues required, and tactile cues required ? ? ?HOME EXERCISE PROGRAM: ?Access Code: [email or web address] ?URL: [email or web address] ?Date: 03/03/2022 ?Prepared by: Daleen Bo ? ?ASSESSMENT: ? ?CLINICAL IMPRESSION: ?Pt able to introduce dynamic balance at today's session without significant LOB. However, pt does demo heavy reliance upon visual input for balance. Pt with difficulty with VOR and tends to shorten steps or  stop during visual perturbation. Pt able to continue with functional strengthening as well today without increasing knee pain. Plan to hold on leg press due to difficulty with deeper degrees of knee flexion. Continue wi

## 2022-04-11 NOTE — Progress Notes (Signed)
? ?Cardiology Office Note ?Date:  04/11/2022  ?Patient ID:  [name], [name] 11-04-1933, MRN [id number] ?PCP:  Lajean Manes, MD  ?Electrophysiologist: Dr. Lovena Le ? ? ?  ?Chief Complaint:  6 mo ? ?History of Present Illness: ?[name] is a 88 y.o. female with history of SVT (s/p AVNRT ablation), AFlutter > Afib >> AV node ablation/PPM, PVCs (intolerant of flecainide and multaq) on amiodarone, venous insufficiency, HTN. ? ?She comes today to be seen for Dr. Lovena Le, last seen by him April 2022, PVCs/symptoms improved on amiodarone, no changes were made. ? ?I saw her 09/30/2021 ?She feels like she is doing quite well. ?This summer had a nose bleed that required an ER visit and ENT follow up.  No particular findings that she recalls, had packing a few days and has not happened again ?Denies any CP, palpitations or SOB ?No syncope ?She does not exercise but stays busy and active ?Discussed reducing her amio dose though she did not want ti ?Planned for labs and 65mof/u ?RV lead threshold stable from prior in clinic thresholds, auto testing reported "high" and RV lead outputs adjusted and turned to monitor ? ?TODAY ?She is accompanied by her husband. ?She has started PT for some generalized weakness and they both have started walking as part of this. ?She has noticed with increased pace/duration she is getting winded.  No CP, no palpitations. ?Her husband inquires if there is any programming that can give her a better HR for exercise. ?She denies any near syncope or syncope. ?She continues to get intermittent nose bleeds, has not required any ER visits to get them to stop, but sometimes does take a while. ?Her PMD recommended that she start to use saline nasal spray and since then none further so far. ? ?Device information ?MDT dual chamber PPM implanted 08/17/2017 ?Programmed VVIR ?S/p AV node ablation ? ?AAD hx ?Amiodarone for PVCs started Nov 2021 ? ?Past Medical History:  ?Diagnosis Date  ? Allergic rhinitis   ?  Arthritis   ? bil knees  ? Breast cancer, left breast ([facility] 01/15/2015  ? Invasive Mammary  ? Chronic venous insufficiency   ? Hematuria   ? negative workup - Dr. HReece Agar ? Hypertension   ? Hypothyroidism   ? Migraine headache   ? MVP (mitral valve prolapse)   ? OAB (overactive bladder)   ? Pacemaker   ? PA[place]ox[place]mal [place] fibrillation) ([place]   ? SVT (supraventricular tachycardia) ([facility] 06/2010  ? s/p AVNRT ablation  ? Wears glasses   ? ? ?Past Surgical History:  ?Procedure Laterality Date  ? A-FLUTTER ABLATION N/A 02/04/2017  ? Procedure: A-Flutter Ablation;  Surgeon: GEvans Lance MD;  [facility];  Service: Cardiovascular;  Laterality: N/A;  ? ABDOMINAL HYSTERECTOMY    ? AV NODE ABLATION N/A 08/17/2017  ? Procedure: AV Node Ablation;  Surgeon: TEvans Lance MD;  [facility];  Service: Cardiovascular;  Laterality: N/A;  ? BREAST LUMPECTOMY WITH RADIOACTIVE SEED LOCALIZATION Left 02/14/2015  ? Procedure: BREAST LUMPECTOMY WITH RADIOACTIVE SEED LOCALIZATION;  Surgeon: DAlphonsa Overall MD;  [facility]  Service: General;  Laterality: Left;  ? BREAST LUMPECTOMY WITH RADIOACTIVE SEED LOCALIZATION Left 11/18/2021  ? Procedure: LEFT BREAST LUMPECTOMY WITH RADIOACTIVE SEED LOCALIZATION;  Surgeon: CErroll Luna MD;  [facility]  Service: General;  Laterality: Left;  ? [place] ? rt br bx  ? CARDIAC CATHETERIZATION  2011  ? ablasion  ? CARDIOVERSION N/A 08/10/2017  ? Procedure: CARDIOVERSION;  Surgeon: Jerline Pain, MD;  [facility] END[facility]COPY;  Service: Cardiovascular;  Laterality: N/A;  ? COLONOSCOPY    ? DILATION AND CURETTAGE OF UTERUS    ? Left Breast Biopsy  01/15/15  ? PACEMAKER IMPLANT N/A 08/17/2017  ? Procedure: Pacemaker Implant;  Surgeon: Evans Lance, MD;  Location: [place] [facility];  Service: Cardiovascular;  Laterality: N/A;  ? TUBAL LIGATION    ? ? ?Current Outpatient Medications  ?Medication Sig Dispense  Refill  ? amiodarone (PACERONE) 200 MG tablet Take 0.5 tablets (100 mg total) by mouth daily. 45 tablet 3  ? Carboxymethylcellul-Glycerin (LUBRICATING EYE DROPS OP) Apply 1 drop to eye daily as needed (dry eyes).    ? Cyanocobalamin (B-12) 1000 MCG TABS Take 1 tablet by mouth daily.    ? ELIQUIS 5 MG TABS tablet TAKE 1 TABLET BY MOUTH  TWICE DAILY 180 tablet 2  ? hydrochlorothiazide (HYDRODIURIL) 25 MG tablet TAKE ONE TABLET ONCE DAILY. 90 tablet 3  ? levothyroxine (SYNTHROID) 100 MCG tablet Take 100 mcg by mouth daily before breakfast.    ? losartan (COZAAR) 50 MG tablet TAKE 1 TABLET ONCE DAILY. 90 tablet 3  ? metoprolol succinate (TOPROL-XL) 25 MG 24 hr tablet TAKE 1 TABLET ONCE DAILY. 30 tablet 6  ? solifenacin (VESICARE) 5 MG tablet Take 5 mg by mouth daily.    ? Vitamin D, Ergocalciferol, (DRISDOL) 50000 units CAPS capsule Take 50,000 Units by mouth every 7 (seven) days.    ? ?No current facility-administered medications for this visit.  ? ? ?Allergies:   Adhesive [tape], Codeine, Latex, and Percodan [oxycodone-aspirin]  ? ?Social History:  The patient  reports that she has never smoked. She has never used smokeless tobacco. She reports current alcohol use. She reports that she does not use drugs.  ? ?Family History:  The patient's family history includes Alzheimer's disease in her sister; Arthritis in her mother; Breast cancer in her cousin, paternal aunt, and sister; Breast cancer (age of onset: 66) in her daughter; Cancer in her paternal aunt; Heart Problems in her sister; Heart attack in her brother; Heart disease in her mother; Hypertension in her mother; Other in her father; Pulmonary disease in her sister; Throat cancer in her paternal uncle. ? ?ROS:  Please see the history of present illness.    ?All other systems are reviewed and otherwise negative.  ? ?PHYSICAL EXAM:  ?VS:  LMP  (LMP Unknown)  BMI: There is no height or weight on file to calculate BMI. ?Well nourished, well developed, in no acute  distress ?HEENT: normocephalic, atraumatic ?Neck: no JVD, carotid bruits or masses ?Cardiac:  RRR; (paced) no significant murmurs, no rubs, or gallops ?Lungs:  CTA b/l, no wheezing, rhonchi or rales ?Abd: soft, nontender ?MS: age appropriate atrophy ?Ext: she has compression wraps on b/l, no edema noted above or below them ?Skin: warm and dry, no rash ?Neuro:  No gross deficits appreciated ?Psych: euthymic mood, full affect ? ?PPM site is stable, no tethering or discomfort ? ? ?EKG:  done today and reviewed by myself ?V paced, 73bpm. QRS 177m ? ?Device interrogation done today and reviewed by myself:  ?Battery and lead measurements are good ?100%VP ?HR histogram stuck at 70-80, minimal  HRs 80-90 , none really higher then 90 ?I did change the activity threshold to low (more sensitive) but not the slope of change ? ? ?08/11/2017: TTE ?Study Conclusions  ?-  Left ventricle: The cavity size was normal. Systolic function was  ?  normal. The estimated ejection fraction was in the range of 55%  ?  to 60%. Wall motion was normal; there were no regional wall  ?  motion abnormalities. Features are consistent with a pseudonormal  ?  left ventricular filling pattern, with concomitant abnormal  ?  relaxation and increased filling pressure (grade 2 diastolic  ?  dysfunction). Doppler parameters are consistent with high  ?  ventricular filling pressure.  ?- Aortic valve: There was no regurgitation.  ?- Mitral valve: Transvalvular velocity was within the normal range.  ?  There was no evidence for stenosis. There was trivial  ?  regurgitation.  ?- Left atrium: The atrium was severely dilated.  ?- Right ventricle: The cavity size was normal. Wall thickness was  ?  normal. Systolic function was normal.  ?- Tricuspid valve: There was no regurgitation.  ?- Pericardium, extracardiac: There was a left pleural effusion.  ? ? ?02/04/2017; EPS/ablation/DCCV ?Conclusion: ?Invasive EP study demonstrating left atrial flutter as well as AV node  reentrant tachycardia with successful DC cardioversion back to sinus rhythm. The patient will be treated with medical therapy and ongoing anticoagulation. ? ?Recent Labs: ?09/30/2021: ALT 21; Hemoglobi

## 2022-04-12 ENCOUNTER — Other Ambulatory Visit: Payer: Self-pay | Admitting: Internal Medicine

## 2022-04-13 ENCOUNTER — Ambulatory Visit (HOSPITAL_BASED_OUTPATIENT_CLINIC_OR_DEPARTMENT_OTHER): Payer: Medicare Other | Attending: Geriatric Medicine | Admitting: Physical Therapy

## 2022-04-13 ENCOUNTER — Encounter (HOSPITAL_BASED_OUTPATIENT_CLINIC_OR_DEPARTMENT_OTHER): Payer: Self-pay | Admitting: Physical Therapy

## 2022-04-13 DIAGNOSIS — R2689 Other abnormalities of gait and mobility: Secondary | ICD-10-CM | POA: Insufficient documentation

## 2022-04-13 DIAGNOSIS — R262 Difficulty in walking, not elsewhere classified: Secondary | ICD-10-CM | POA: Insufficient documentation

## 2022-04-13 DIAGNOSIS — M6281 Muscle weakness (generalized): Secondary | ICD-10-CM | POA: Insufficient documentation

## 2022-04-13 NOTE — Telephone Encounter (Signed)
Prescription refill request for Eliquis received. ?Indication: Atrial fib ?Last office visit: 09/30/21  Jens Som PA-C ?Scr: 1.04 on 11/12/21 ?Age: 88 ?Weight: 74.8kg ? ?Based on above findings Eliquis '5mg'$  twice daily is the appropriate dose.  Refill approved. ? ?

## 2022-04-13 NOTE — Therapy (Signed)
?OUTPATIENT PHYSICAL THERAPY LOWER EXTREMITY TREATMENT ? ?Progress Note ?Reporting Period 03/03/22 to 04/13/22 ? ? ?See note below for Objective Data and Assessment of Progress/Goals.  ? ?  ? ? ?Patient Name: [name] ?MRN: [id number] ?DOB:[birth year], 89 y.o., female ?Today's Date: 04/13/2022 ? ? PT End of Session - 04/13/22 1354   ? ? Visit Number 10   ? Number of Visits 21   ? Date for PT Re-Evaluation 06/01/22   ? Authorization Type UHC MCR   ? PT Start Time 1350   ? PT Stop Time 1425   ? PT Time Calculation (min) 35 min   ? Activity Tolerance Patient tolerated treatment well   ? Behavior During Therapy [facility] for tasks assessed/performed   ? ?  ?  ? ?  ? ? ? ? ? ?Past Medical History:  ?Diagnosis Date  ? Allergic rhinitis   ? Arthritis   ? bil knees  ? Breast cancer, left breast ([place]) 01/15/2015  ? Invasive Mammary  ? Chronic venous insufficiency   ? Hematuria   ? negative workup - Dr. Reece Agar  ? Hypertension   ? Hypothyroidism   ? Migraine headache   ? MVP (mitral valve prolapse)   ? OAB (overactive bladder)   ? Pacemaker   ? PAF (paroxysmal atrial fibrillation) ([place])   ? SVT (supraventricular tachycardia) ([place]) 06/2010  ? s/p AVNRT ablation  ? Wears glasses   ? ?Past Surgical History:  ?Procedure Laterality Date  ? A-FLUTTER ABLATION N/A 02/04/2017  ? Procedure: A-Flutter Ablation;  Surgeon: Evans Lance, MD;  Location: [place] [facility];  Service: Cardiovascular;  Laterality: N/A;  ? ABDOMINAL HYSTERECTOMY    ? AV NODE ABLATION N/A 08/17/2017  ? Procedure: AV Node Ablation;  Surgeon: Evans Lance, MD;  Location: [place] [facility];  Service: Cardiovascular;  Laterality: N/A;  ? BREAST LUMPECTOMY WITH RADIOACTIVE SEED LOCALIZATION Left 02/14/2015  ? Procedure: BREAST LUMPECTOMY WITH RADIOACTIVE SEED LOCALIZATION;  Surgeon: Alphonsa Overall, MD;  Location: [place];  Service: General;  Laterality: Left;  ? BREAST LUMPECTOMY WITH RADIOACTIVE SEED LOCALIZATION Left 11/18/2021  ? Procedure: LEFT  BREAST LUMPECTOMY WITH RADIOACTIVE SEED LOCALIZATION;  Surgeon: Erroll Luna, MD;  Location: [place];  Service: General;  Laterality: Left;  ? [place]  ? rt br bx  ? CARDIAC CATHETERIZATION  2011  ? ablasion  ? CARDIOVERSION N/A 08/10/2017  ? Procedure: CARDIOVERSION;  Surgeon: Jerline Pain, MD;  Location: [facility] ENDOSCOPY;  Service: Cardiovascular;  Laterality: N/A;  ? COLONOSCOPY    ? DILATION AND CURETTAGE OF UTERUS    ? Left Breast Biopsy  01/15/15  ? PACEMAKER IMPLANT N/A 08/17/2017  ? Procedure: Pacemaker Implant;  Surgeon: Evans Lance, MD;  Location: [place] [facility];  Service: Cardiovascular;  Laterality: N/A;  ? TUBAL LIGATION    ? ?Patient Active Problem List  ? Diagnosis Date Noted  ? Chronic diastolic heart failure ([place]) 03/27/2021  ? PVC's (premature ventricular contractions) 09/23/2020  ? Pacemaker 01/10/2020  ? Pain in left knee 09/27/2018  ? Acute diastolic CHF (congestive heart failure) ([place]) 08/11/2017  ? Acute CHF (congestive heart failure) ([place]) 08/11/2017  ? Acute CHF ([place]) 08/11/2017  ? Persistent atrial fibrillation ([place])   ? ILD (interstitial lung disease) ([place]) 02/21/2017  ? CHF (congestive heart failure) ([place]) 02/21/2017  ? Dyspnea and respiratory abnormality 02/11/2017  ? Atrial flutter ([place]) 02/04/2017  ? Chronic atrial fibrillation ([employer]) 01/19/2017  ?  Abnormality of gait and mobility 01/19/2017  ? Hypothyroidism 01/19/2017  ? Other specified disorders of bone density and structure, unspecified site 01/19/2017  ? Genetic testing 03/13/2015  ? Paresthesia of skin 03/03/2015  ? Breast cancer of lower-outer quadrant of left female breast ([place]) 02/06/2015  ? Hematuria   ? PAF (paroxysmal atrial fibrillation) ([place])   ? Chronic venous insufficiency   ? SVT (supraventricular tachycardia) ([place]) 06/12/2010  ? Essential hypertension 04/22/2010  ? MITRAL VALVE PROLAPSE 04/22/2010  ? Atypical atrial flutter ([place]) 04/22/2010  ? SUPRAVENTRICULAR TACHYCARDIA 04/22/2010   ? ? ?PCP: Lajean Manes, MD ? ?REFERRING PROVIDER: Lajean Manes, MD ? ?REFERRING DIAG: R26.9 (ICD-10-CM) - Unspecified abnormalities of gait and mobility ? ?THERAPY DIAG:  ?Muscle weakness (generalized) ? ?Difficulty walking ? ?Other abnormalities of gait and mobility ? ?ONSET DATE: 2020 ? ?SUBJECTIVE:  ? ?SUBJECTIVE STATEMENT: ?Pt states she feels she is getting aorund easier. She is about 30% better. She is walking reguarly with her husband but still has difficulty with getting out of a chair.  ? ?PERTINENT HISTORY: ?Pacemaker, CHF, venous insufficiency, dermatitis ? ?PAIN:  ?Are you having pain? No ? ?PRECAUTIONS: NO ESTIM ? ?WEIGHT BEARING RESTRICTIONS No ? ?FALLS:  ?Has patient fallen in last 6 months? Yes, Number of falls: 1, fell trying ot get out of chair, had to crawl in order to get up, unable to get up from the floor ? ?LIVING ENVIRONMENT: ?Lives with: lives with their family ?Lives in: House/apartment ?Stairs: Yes;  ?Has following equipment at home: None ? ?OCCUPATION: retired ? ?PLOF: Independent with basic ADLs ? ?PATIENT GOALS : Pt would like to strengthen her legs.  ? ? ?OBJECTIVE:  ? ? ?PATIENT SURVEYS:  ?ABC 60% ? ?Progress Note 5/2: 80% ? ? ?LE MMT: ?  ?MMT Right ?04/13/2022 Left ?04/13/2022  ?Hip flexion 4+/5 4+/5  ?Hip extension      ?Hip abduction 4/5 4/5  ?Hip adduction 4+/5 4+/5  ?Knee flexion 4/5 4/5  ?Knee extension 4+/5 4+/5  ? (Blank rows = not tested) ?  ?  ?FUNCTIONAL TESTS:  ? ?5/2 Progress Note ?5 times sit to stand: 20s from table raised 2" above chair height ?Timed up and go (TUG): 15.s ?  ? ?GAIT: ?Distance walked: 54f ?Assistive device utilized: None ?Level of assistance: Complete Independence ?Comments: decreased step length, lack of hip extension ? ? ?TODAY'S TREATMENT: ? ?5/2 ?Exercises ?  Nustep L5: 771m (legs only) ?  Seated lumbar flexion stretch 10s 6x ?  Supine SKTC 30s 3x ?  STS 5x5 ?Step up 4" box 2x10  ? Sidestepping at raised table 4x laps RTB at knees (no  UE) ?Heel/toe Raises x 10 reps x 2 ? lateral lunge with UE support 10x each ? ? ?4/28 ?Exercises ?  Nustep L3: 63m93m(legs only) ?  5x5 STS no UE from raised table, 4lb weight ?  Suitcase carry 4lbs 43f67flaps ?  Gait with head turns 43ft70flaps ?  L LE LAQ 6lbs 3x10 ? ?At counter:  ? Step up 4" box 2x10  ? Sidestepping at raised table 4x laps RTB at knees (no UE) ? ? ?04/06/22 ?Exercises ?  Nustep L3: 63min 74mgs only) ?  5x5 STS no UE from raised table, 4lb weight ?  Shuttle leg press 50lbs 2x10 ?  L LE LAQ 5lbs 3x10 ? ?At counter:  ? Step up 4" box 2x10  ? Sidestepping at raised table 4x laps RTB at knees (no UE) ?Heel/toe Raises x  10 reps x 2 ? lateral lunge with UE support 10x each ? ?04/01/22 ?Exercises ?  Nustep L3: 61mn (legs only) ?  5x5 STS no UE from raised table, focus on power concentric, slow eccentric; 2" block under L LE ? ?  L LE LAQ 5lbs 3x10 ? ?At counter:  ? Step up 4" box 2x10  ? Sidestepping at bar 3s laps YTB at knees (no UE) ?Heel/toe Raises x 10 reps x 2 ?Standing gastroc stretch 30s 2x ?  Semi tandem stance x 15 sec with intermittent UE to steady ?  Retro walking 3x laps (intermittent UE) ?Fwd and lateral lunge with UE support 10x each ? ? ?PATIENT EDUCATION:  ?Education details: walking program, exercise progression, DOMS expectations, muscle firing,  envelope of function, HEP, POC ? ?Person educated: Patient ?Education method: Explanation, Demonstration, Tactile cues, Verbal cues, and Handouts ?Education comprehension: verbalized understanding, returned demonstration, verbal cues required, and tactile cues required ? ? ?HOME EXERCISE PROGRAM: ?Access Code: [email or web address]?URL: [email or web address] ?Date: 03/03/2022 ?Prepared by: ADaleen Bo? ?ASSESSMENT: ? ?CLINICAL IMPRESSION: ?Pt with improvement in objective measures as well as self perceived balance measure. Pt with improvement in LE strength, gait speed, and transfers but still requires a environmental modification for full STS  that appears related to hip flexibility and inability to get COG over BOS. Pt had improvement with STS following hip and lumbar stretching. PT HEP updated at this time to progress LE strength and work on functional

## 2022-04-14 ENCOUNTER — Ambulatory Visit: Payer: Medicare Other | Admitting: Physician Assistant

## 2022-04-14 ENCOUNTER — Encounter: Payer: Self-pay | Admitting: Physician Assistant

## 2022-04-14 VITALS — BP 138/90 | HR 72 | Ht 68.0 in | Wt 166.0 lb

## 2022-04-14 DIAGNOSIS — I10 Essential (primary) hypertension: Secondary | ICD-10-CM

## 2022-04-14 DIAGNOSIS — R0600 Dyspnea, unspecified: Secondary | ICD-10-CM

## 2022-04-14 DIAGNOSIS — I484 Atypical atrial flutter: Secondary | ICD-10-CM | POA: Diagnosis not present

## 2022-04-14 DIAGNOSIS — I5031 Acute diastolic (congestive) heart failure: Secondary | ICD-10-CM

## 2022-04-14 DIAGNOSIS — Z79899 Other long term (current) drug therapy: Secondary | ICD-10-CM

## 2022-04-14 DIAGNOSIS — Z950 Presence of cardiac pacemaker: Secondary | ICD-10-CM

## 2022-04-14 DIAGNOSIS — I4821 Permanent atrial fibrillation: Secondary | ICD-10-CM

## 2022-04-14 DIAGNOSIS — I4892 Unspecified atrial flutter: Secondary | ICD-10-CM

## 2022-04-14 LAB — CUP PACEART INCLINIC DEVICE CHECK
Battery Remaining Longevity: 33 mo
Battery Voltage: 2.94 V
Brady Statistic AP VP Percent: 0 %
Brady Statistic AP VS Percent: 0 %
Brady Statistic AS VP Percent: 99.98 %
Brady Statistic AS VS Percent: 0.02 %
Brady Statistic RA Percent Paced: 0 %
Brady Statistic RV Percent Paced: 99.98 %
Date Time Interrogation Session: 20230503130503
Implantable Lead Implant Date: 20180905
Implantable Lead Implant Date: 20180905
Implantable Lead Location: 753859
Implantable Lead Location: 753860
Implantable Lead Model: 3830
Implantable Lead Model: 5076
Implantable Pulse Generator Implant Date: 20180905
Lead Channel Impedance Value: 285 Ohm
Lead Channel Impedance Value: 323 Ohm
Lead Channel Impedance Value: 399 Ohm
Lead Channel Impedance Value: 532 Ohm
Lead Channel Pacing Threshold Amplitude: 2.375 V
Lead Channel Pacing Threshold Pulse Width: 0.4 ms
Lead Channel Sensing Intrinsic Amplitude: 1.5 mV
Lead Channel Sensing Intrinsic Amplitude: 1.5 mV
Lead Channel Sensing Intrinsic Amplitude: 2.875 mV
Lead Channel Sensing Intrinsic Amplitude: 3.125 mV
Lead Channel Setting Pacing Amplitude: 2.5 V
Lead Channel Setting Pacing Pulse Width: 1 ms
Lead Channel Setting Sensing Sensitivity: 0.9 mV

## 2022-04-14 LAB — HEPATIC FUNCTION PANEL
ALT: 11 IU/L (ref 0–32)
AST: 22 IU/L (ref 0–40)
Albumin: 4.1 g/dL (ref 3.6–4.6)
Alkaline Phosphatase: 117 IU/L (ref 44–121)
Bilirubin Total: 0.5 mg/dL (ref 0.0–1.2)
Bilirubin, Direct: 0.16 mg/dL (ref 0.00–0.40)
Total Protein: 7.6 g/dL (ref 6.0–8.5)

## 2022-04-14 LAB — CBC
Hematocrit: 34.8 % (ref 34.0–46.6)
Hemoglobin: 11.8 g/dL (ref 11.1–15.9)
MCH: 29.3 pg (ref 26.6–33.0)
MCHC: 33.9 g/dL (ref 31.5–35.7)
MCV: 86 fL (ref 79–97)
Platelets: 204 10*3/uL (ref 150–450)
RBC: 4.03 x10E6/uL (ref 3.77–5.28)
RDW: 14.2 % (ref 11.7–15.4)
WBC: 5.8 10*3/uL (ref 3.4–10.8)

## 2022-04-14 LAB — TSH: TSH: 2.4 u[IU]/mL (ref 0.450–4.500)

## 2022-04-14 NOTE — Patient Instructions (Addendum)
Medication Instructions:  ? ? ?Your physician recommends that you continue on your current medications as directed. Please refer to the Current Medication list given to you today. ? ? ? ?*If you need a refill on your cardiac medications before your next appointment, please call your pharmacy* ? ? ?Lab Work:  LFT TSH AND CBC TODAY  ? ? ?If you have labs (blood work) drawn today and your tests are completely normal, you will receive your results only by: ?MyChart Message (if you have MyChart) OR ?A paper copy in the mail ?If you have any lab test that is abnormal or we need to change your treatment, we will call you to review the results. ? ? ?Testing/Procedures:  Your physician has requested that you have an echocardiogram. Echocardiography is a painless test that uses sound waves to create images of your heart. It provides your doctor with information about the size and shape of your heart and how well your heart?s chambers and valves are working. This procedure takes approximately one hour. There are no restrictions for this procedure. ? ? ? ? ? ?Follow-Up: ?At [facility], you and your health needs are our priority.  As part of our continuing mission to provide you with exceptional heart care, we have created designated Provider Care Teams.  These Care Teams include your primary Cardiologist (physician) and Advanced Practice Providers (APPs -  Physician Assistants and Nurse Practitioners) who all work together to provide you with the care you need, when you need it. ? ?We recommend signing up for the patient portal called "MyChart".  Sign up information is provided on this After Visit Summary.  MyChart is used to connect with patients for Virtual Visits (Telemedicine).  Patients are able to view lab/test results, encounter notes, upcoming appointments, etc.  Non-urgent messages can be sent to your provider as well.   ?To learn more about what you can do with MyChart, go to [email or web address].   ? ?Your  next appointment:   ?4 -6 week(s) ? ?The format for your next appointment:   ?In Person ? ?Provider:   ?You may see Dr. Lovena Le  or one of the following Advanced Practice Providers on your designated Care Team:   ?Tommye Standard, PA-C ?Legrand Como "Jonni Sanger" Chalmers Cater, PA-C{ ? ? ? ?Other Instructions ? ? ?Important Information About [place] ? ? ? ? ?  ?

## 2022-04-15 ENCOUNTER — Ambulatory Visit (HOSPITAL_BASED_OUTPATIENT_CLINIC_OR_DEPARTMENT_OTHER): Payer: Medicare Other | Admitting: Physical Therapy

## 2022-04-15 ENCOUNTER — Encounter (HOSPITAL_BASED_OUTPATIENT_CLINIC_OR_DEPARTMENT_OTHER): Payer: Self-pay | Admitting: Physical Therapy

## 2022-04-15 DIAGNOSIS — R262 Difficulty in walking, not elsewhere classified: Secondary | ICD-10-CM

## 2022-04-15 DIAGNOSIS — M6281 Muscle weakness (generalized): Secondary | ICD-10-CM

## 2022-04-15 DIAGNOSIS — R2689 Other abnormalities of gait and mobility: Secondary | ICD-10-CM

## 2022-04-15 NOTE — Therapy (Signed)
?OUTPATIENT PHYSICAL THERAPY LOWER EXTREMITY TREATMENT ?  ? ? ?Patient Name: [name] ?MRN: [id number] ?DOB:May 29, 1933, 88 y.o., female ?Today's Date: 04/15/2022 ? ? PT End of Session - 04/15/22 1351   ? ? Visit Number 11   ? Number of Visits 21   ? Date for PT Re-Evaluation 06/01/22   ? Authorization Type UHC MCR   ? PT Start Time 1346   ? PT Stop Time 1426   ? PT Time Calculation (min) 40 min   ? Activity Tolerance Patient tolerated treatment well   ? Behavior During Therapy [facility] for tasks assessed/performed   ? ?  ?  ? ?  ? ? ? ? ? ? ?Past Medical History:  ?Diagnosis Date  ? Allergic rhinitis   ? Arthritis   ? bil knees  ? Breast cancer, left breast ([place]) 01/15/2015  ? Invasive Mammary  ? Chronic venous insufficiency   ? Hematuria   ? negative workup - Dr. Reece Agar  ? Hypertension   ? Hypothyroidism   ? Migraine headache   ? MVP (mitral valve prolapse)   ? OAB (overactive bladder)   ? Pacemaker   ? PAF (paroxysmal atrial fibrillation) ([place])   ? SVT (supraventricular tachycardia) ([place]) 06/2010  ? s/p AVNRT ablation  ? Wears glasses   ? ?Past Surgical History:  ?Procedure Laterality Date  ? A-FLUTTER ABLATION N/A 02/04/2017  ? Procedure: A-Flutter Ablation;  Surgeon: Evans Lance, MD;  Location: [place] [facility];  Service: Cardiovascular;  Laterality: N/A;  ? ABDOMINAL HYSTERECTOMY    ? AV NODE ABLATION N/A 08/17/2017  ? Procedure: AV Node Ablation;  Surgeon: Evans Lance, MD;  Location: [place] [facility];  Service: Cardiovascular;  Laterality: N/A;  ? BREAST LUMPECTOMY WITH RADIOACTIVE SEED LOCALIZATION Left 02/14/2015  ? Procedure: BREAST LUMPECTOMY WITH RADIOACTIVE SEED LOCALIZATION;  Surgeon: Alphonsa Overall, MD;  Location: [place];  Service: General;  Laterality: Left;  ? BREAST LUMPECTOMY WITH RADIOACTIVE SEED LOCALIZATION Left 11/18/2021  ? Procedure: LEFT BREAST LUMPECTOMY WITH RADIOACTIVE SEED LOCALIZATION;  Surgeon: Erroll Luna, MD;  Location: [place];  Service:  General;  Laterality: Left;  ? [place]  ? rt br bx  ? CARDIAC CATHETERIZATION  2011  ? ablasion  ? CARDIOVERSION N/A 08/10/2017  ? Procedure: CARDIOVERSION;  Surgeon: Jerline Pain, MD;  Location: [facility];  Service: Cardiovascular;  Laterality: N/A;  ? COLONOSCOPY    ? DILATION AND CURETTAGE OF UTERUS    ? Left Breast Biopsy  01/15/15  ? PACEMAKER IMPLANT N/A 08/17/2017  ? Procedure: Pacemaker Implant;  Surgeon: Evans Lance, MD;  Location: [place] [facility];  Service: Cardiovascular;  Laterality: N/A;  ? TUBAL LIGATION    ? ?Patient Active Problem List  ? Diagnosis Date Noted  ? Chronic diastolic heart failure ([place]) 03/27/2021  ? PVC's (premature ventricular contractions) 09/23/2020  ? Pacemaker 01/10/2020  ? Pain in left knee 09/27/2018  ? Acute diastolic CHF (congestive heart failure) ([place]) 08/11/2017  ? Acute CHF (congestive heart failure) ([place]) 08/11/2017  ? Acute CHF ([place]) 08/11/2017  ? Persistent atrial fibrillation ([place])   ? ILD (interstitial lung disease) ([place]) 02/21/2017  ? CHF (congestive heart failure) ([place]) 02/21/2017  ? Dyspnea and respiratory abnormality 02/11/2017  ? Atrial flutter ([place]) 02/04/2017  ? Chronic atrial fibrillation ([place]) 01/19/2017  ? Abnormality of gait and mobility 01/19/2017  ? Hypothyroidism 01/19/2017  ? Other specified disorders of bone density and structure,  unspecified site 01/19/2017  ? Genetic testing 03/13/2015  ? Paresthesia of skin 03/03/2015  ? Breast cancer of lower-outer quadrant of left female breast ([place]) 02/06/2015  ? Hematuria   ? PAF (paroxysmal atrial fibrillation) ([place])   ? Chronic venous insufficiency   ? SVT (supraventricular tachycardia) ([place]) 06/12/2010  ? Essential hypertension 04/22/2010  ? MITRAL VALVE PROLAPSE 04/22/2010  ? Atypical atrial flutter ([place]) 04/22/2010  ? SUPRAVENTRICULAR TACHYCARDIA 04/22/2010  ? ? ?PCP: Lajean Manes, MD ? ?REFERRING PROVIDER: Lajean Manes, MD ? ?REFERRING DIAG: R26.9 (ICD-10-CM) - Unspecified  abnormalities of gait and mobility ? ?THERAPY DIAG:  ?Muscle weakness (generalized) ? ?Difficulty walking ? ?Other abnormalities of gait and mobility ? ?ONSET DATE: 2020 ? ?SUBJECTIVE:  ? ?SUBJECTIVE STATEMENT: ?Pt states she is still walking daily. She had no issues with soreness after last session. ? ?PERTINENT HISTORY: ?Pacemaker, CHF, venous insufficiency, dermatitis ? ?PAIN:  ?Are you having pain? No ? ?PRECAUTIONS: NO ESTIM ? ?WEIGHT BEARING RESTRICTIONS No ? ?FALLS:  ?Has patient fallen in last 6 months? Yes, Number of falls: 1, fell trying ot get out of chair, had to crawl in order to get up, unable to get up from the floor ? ?LIVING ENVIRONMENT: ?Lives with: lives with their family ?Lives in: House/apartment ?Stairs: Yes;  ?Has following equipment at home: None ? ?OCCUPATION: retired ? ?PLOF: Independent with basic ADLs ? ?PATIENT GOALS : Pt would like to strengthen her legs.  ? ? ?OBJECTIVE:  ? ? ?PATIENT SURVEYS:  ?ABC 60% ? ?Progress Note 5/2: 80% ? ? ?LE MMT: ?  ?MMT Right ?04/13/2022 Left ?04/13/2022  ?Hip flexion 4+/5 4+/5  ?Hip extension      ?Hip abduction 4/5 4/5  ?Hip adduction 4+/5 4+/5  ?Knee flexion 4/5 4/5  ?Knee extension 4+/5 4+/5  ? (Blank rows = not tested) ?  ?  ?FUNCTIONAL TESTS:  ? ?5/2 Progress Note ?5 times sit to stand: 20s from table raised 2" above chair height ?Timed up and go (TUG): 15.s ?  ? ?GAIT: ?Distance walked: 40f ?Assistive device utilized: None ?Level of assistance: Complete Independence ?Comments: decreased step length, lack of hip extension ? ? ?TODAY'S TREATMENT: ? ?5/4 ?Exercises ?  Nustep L5: 818m (legs only) ?  Supine SKTC 30s 3x ?  STS 3x10 5lbs  ?  LAQ L LE 7.5 lbs ?  ?Step up 4" box 2x10  ? Sidestepping at raised standing hip ABD RTB at knees 2x10 ?Heel/toe Raises x 10 reps x 2 ?Tandem balance 15s 2x each side ?  ?5/2 ?Exercises ?  Nustep L5: 59m59m(legs only) ?  Seated lumbar flexion stretch 10s 6x ?  Supine SKTC 30s 3x ?  STS 5x5 ?Step up 4" box 2x10   ? Sidestepping at raised table 4x laps RTB at knees (no UE) ?Heel/toe Raises x 10 reps x 2 ? lateral lunge with UE support 10x each ? ? ?4/28 ?Exercises ?  Nustep L3: 59mi259mlegs only) ?  5x5 STS no UE from raised table, 4lb weight ?  Suitcase carry 4lbs 75ft53faps ?  Gait with head turns 75ft 44faps ?  L LE LAQ 6lbs 3x10 ? ?At counter:  ? Step up 4" box 2x10  ? Sidestepping at raised table 4x laps RTB at knees (no UE) ? ? ?04/06/22 ?Exercises ?  Nustep L3: 59min (9ms only) ?  5x5 STS no UE from raised table, 4lb weight ?  Shuttle leg press 50lbs 2x10 ?  L LE LAQ 5lbs 3x10 ? ?  At counter:  ? Step up 4" box 2x10  ? Sidestepping at raised table 4x laps RTB at knees (no UE) ?Heel/toe Raises x 10 reps x 2 ? lateral lunge with UE support 10x each ? ?04/01/22 ?Exercises ?  Nustep L3: 76mn (legs only) ?  5x5 STS no UE from raised table, focus on power concentric, slow eccentric; 2" block under L LE ? ?  L LE LAQ 5lbs 3x10 ? ?At counter:  ? Step up 4" box 2x10  ? Sidestepping at bar 3s laps YTB at knees (no UE) ?Heel/toe Raises x 10 reps x 2 ?Standing gastroc stretch 30s 2x ?  Semi tandem stance x 15 sec with intermittent UE to steady ?  Retro walking 3x laps (intermittent UE) ?Fwd and lateral lunge with UE support 10x each ? ? ?PATIENT EDUCATION:  ?Education details: walking program, exercise progression, DOMS expectations, muscle firing,  envelope of function, HEP, POC ? ?Person educated: Patient ?Education method: Explanation, Demonstration, Tactile cues, Verbal cues, and Handouts ?Education comprehension: verbalized understanding, returned demonstration, verbal cues required, and tactile cues required ? ? ?HOME EXERCISE PROGRAM: ?Access Code: [email or web address]?URL: [email or web address] ?Date: 03/03/2022 ?Prepared by: ADaleen Bo? ?ASSESSMENT: ? ?CLINICAL IMPRESSION: ?Pt able to perform increased volume and intensity of exercise a ttoday's session. Pt able to tolerate increased duration of exercise with shortened  rest breaks. However, pt with difficulty during step ups and tandem balance today due to stability deficits and slowed right reactions. Plan to continue with strength and balance as tolerated.  Pt would benefit from continued

## 2022-04-19 ENCOUNTER — Ambulatory Visit (HOSPITAL_BASED_OUTPATIENT_CLINIC_OR_DEPARTMENT_OTHER): Payer: Medicare Other | Admitting: Physical Therapy

## 2022-04-21 ENCOUNTER — Ambulatory Visit (HOSPITAL_BASED_OUTPATIENT_CLINIC_OR_DEPARTMENT_OTHER): Payer: Medicare Other | Admitting: Physical Therapy

## 2022-04-21 ENCOUNTER — Encounter (HOSPITAL_BASED_OUTPATIENT_CLINIC_OR_DEPARTMENT_OTHER): Payer: Self-pay | Admitting: Physical Therapy

## 2022-04-21 DIAGNOSIS — R2689 Other abnormalities of gait and mobility: Secondary | ICD-10-CM

## 2022-04-21 DIAGNOSIS — R262 Difficulty in walking, not elsewhere classified: Secondary | ICD-10-CM

## 2022-04-21 DIAGNOSIS — M6281 Muscle weakness (generalized): Secondary | ICD-10-CM | POA: Diagnosis not present

## 2022-04-21 NOTE — Therapy (Signed)
?OUTPATIENT PHYSICAL THERAPY LOWER EXTREMITY TREATMENT ?  ? ? ?Patient Name: [name] ?MRN: [id number] ?DOB:1933/01/20, 88 y.o., female ?Today's Date: 04/21/2022 ? ? PT End of Session - 04/21/22 1411   ? ? Visit Number 12   ? Number of Visits 21   ? Date for PT Re-Evaluation 06/01/22   ? Authorization Type UHC MCR   ? PT Start Time 1415   ? PT Stop Time 1455   ? PT Time Calculation (min) 40 min   ? Activity Tolerance Patient tolerated treatment well   ? Behavior During Therapy [facility] for tasks assessed/performed   ? ?  ?  ? ?  ? ? ? ? ? ? ?Past Medical History:  ?Diagnosis Date  ? Allergic rhinitis   ? Arthritis   ? bil knees  ? Breast cancer, left breast ([place]) 01/15/2015  ? Invasive Mammary  ? Chronic venous insufficiency   ? Hematuria   ? negative workup - Dr. Reece Agar  ? Hypertension   ? Hypothyroidism   ? Migraine headache   ? MVP (mitral valve prolapse)   ? OAB (overactive bladder)   ? Pacemaker   ? PAF (paroxysmal atrial fibrillation) ([place])   ? SVT (supraventricular tachycardia) ([place]) 06/2010  ? s/p AVNRT ablation  ? Wears glasses   ? ?Past Surgical History:  ?Procedure Laterality Date  ? A-FLUTTER ABLATION N/A 02/04/2017  ? Procedure: A-Flutter Ablation;  Surgeon: Evans Lance, MD;  Location: [place] [facility];  Service: Cardiovascular;  Laterality: N/A;  ? ABDOMINAL HYSTERECTOMY    ? AV NODE ABLATION N/A 08/17/2017  ? Procedure: AV Node Ablation;  Surgeon: Evans Lance, MD;  Location: [place] [facility];  Service: Cardiovascular;  Laterality: N/A;  ? BREAST LUMPECTOMY WITH RADIOACTIVE SEED LOCALIZATION Left 02/14/2015  ? Procedure: BREAST LUMPECTOMY WITH RADIOACTIVE SEED LOCALIZATION;  Surgeon: Alphonsa Overall, MD;  Location: [place];  Service: General;  Laterality: Left;  ? BREAST LUMPECTOMY WITH RADIOACTIVE SEED LOCALIZATION Left 11/18/2021  ? Procedure: LEFT BREAST LUMPECTOMY WITH RADIOACTIVE SEED LOCALIZATION;  Surgeon: Erroll Luna, MD;  Location: [place];  Service:  General;  Laterality: Left;  ? [place]  ? rt br bx  ? CARDIAC CATHETERIZATION  2011  ? ablasion  ? CARDIOVERSION N/A 08/10/2017  ? Procedure: CARDIOVERSION;  Surgeon: Jerline Pain, MD;  Location: [facility];  Service: Cardiovascular;  Laterality: N/A;  ? COLONOSCOPY    ? DILATION AND CURETTAGE OF UTERUS    ? Left Breast Biopsy  01/15/15  ? PACEMAKER IMPLANT N/A 08/17/2017  ? Procedure: Pacemaker Implant;  Surgeon: Evans Lance, MD;  Location: [place] [facility];  Service: Cardiovascular;  Laterality: N/A;  ? TUBAL LIGATION    ? ?Patient Active Problem List  ? Diagnosis Date Noted  ? Chronic diastolic heart failure ([place]) 03/27/2021  ? PVC's (premature ventricular contractions) 09/23/2020  ? Pacemaker 01/10/2020  ? Pain in left knee 09/27/2018  ? Acute diastolic CHF (congestive heart failure) ([place]) 08/11/2017  ? Acute CHF (congestive heart failure) ([place]) 08/11/2017  ? Acute CHF ([place]) 08/11/2017  ? Persistent atrial fibrillation ([place])   ? ILD (interstitial lung disease) ([place]) 02/21/2017  ? CHF (congestive heart failure) ([place]) 02/21/2017  ? Dyspnea and respiratory abnormality 02/11/2017  ? Atrial flutter ([place]) 02/04/2017  ? Chronic atrial fibrillation ([place]) 01/19/2017  ? Abnormality of gait and mobility 01/19/2017  ? Hypothyroidism 01/19/2017  ? Other specified disorders of bone density and structure,  unspecified site 01/19/2017  ? Genetic testing 03/13/2015  ? Paresthesia of skin 03/03/2015  ? Breast cancer of lower-outer quadrant of left female breast ([place]) 02/06/2015  ? Hematuria   ? PAF (paroxysmal atrial fibrillation) ([place])   ? Chronic venous insufficiency   ? SVT (supraventricular tachycardia) ([place]) 06/12/2010  ? Essential hypertension 04/22/2010  ? MITRAL VALVE PROLAPSE 04/22/2010  ? Atypical atrial flutter ([place]) 04/22/2010  ? SUPRAVENTRICULAR TACHYCARDIA 04/22/2010  ? ? ?PCP: Lajean Manes, MD ? ?REFERRING PROVIDER: Lajean Manes, MD ? ?REFERRING DIAG: R26.9 (ICD-10-CM) - Unspecified  abnormalities of gait and mobility ? ?THERAPY DIAG:  ?Muscle weakness (generalized) ? ?Difficulty walking ? ?Other abnormalities of gait and mobility ? ?ONSET DATE: 2020 ? ?SUBJECTIVE:  ? ?SUBJECTIVE STATEMENT: ?"I feel like all I do is exercise."   Pt reports she can walk at least 15 min without rest breaks.   "My husband tells me I'm getting out of chair a little better, but I can't see it".  She reports she still needs to use her arms.   She is going to the dermatologist to have her R lower leg examined; she hopes it is not the start of an ulcer.  ? ?PERTINENT HISTORY: ?Pacemaker, CHF, venous insufficiency, dermatitis ? ?PAIN:  ?Are you having pain? Yes, 8/10 ?Rt lower lateral leg  ? ? ?PRECAUTIONS: NO ESTIM ? ?WEIGHT BEARING RESTRICTIONS No ? ?FALLS:  ?Has patient fallen in last 6 months? Yes, Number of falls: 1, fell trying ot get out of chair, had to crawl in order to get up, unable to get up from the floor ? ?LIVING ENVIRONMENT: ?Lives with: lives with their family ?Lives in: House/apartment ?Stairs: Yes;  ?Has following equipment at home: None ? ?OCCUPATION: retired ? ?PLOF: Independent with basic ADLs ? ?PATIENT GOALS : Pt would like to strengthen her legs.  ? ? ?OBJECTIVE:  ? ? ?PATIENT SURVEYS:  ?ABC 60% ? ?Progress Note 5/2: 80% ? ? ?LE MMT: ?  ?MMT Right ?04/13/2022 Left ?04/13/2022  ?Hip flexion 4+/5 4+/5  ?Hip extension      ?Hip abduction 4/5 4/5  ?Hip adduction 4+/5 4+/5  ?Knee flexion 4/5 4/5  ?Knee extension 4+/5 4+/5  ? (Blank rows = not tested) ?  ?  ?FUNCTIONAL TESTS:  ? ?5/2 Progress Note ?5 times sit to stand: 20s from table raised 2" above chair height ?Timed up and go (TUG): 15.s ?  ? ?GAIT: ?Distance walked: 55f ?Assistive device utilized: None ?Level of assistance: Complete Independence ?Comments: decreased step length, lack of hip extension ? ? ?TODAY'S TREATMENT: ?5/10 ?Exercises ?  Nustep L5: 877m (legs only) ?  STS 3 x 10 5lbs (elevated table; weight at chest) ?  Step up 4" box  alternating LE, x 5 reps each leg 2 sets, UE support on counter  ?Tandem balance 15s 2x each side with intermittent UE to steady ?Supine SKTC 30s 2x each leg ?  Bridge with 10 sec hold in hip ext x 5 ?Retro walking 3x laps, next to railing (intermittent UE)- cues to increase L step length ?Heel/toe raises x 15 ?Side stepping at railing with green band at thighs/ ankle x 2 laps ?  ?5/4 ?Exercises ?  Nustep L5: 77m52m(legs only) ?  Supine SKTC 30s 3x ?  STS 3x10 5lbs  ?  LAQ L LE 7.5 lbs ?  ?Step up 4" box 2x10  ? Sidestepping at raised standing hip ABD RTB at knees 2x10 ?Heel/toe Raises x 10 reps x 2 ?Tandem  balance 15s 2x each side ?  ?5/2 ?Exercises ?  Nustep L5: 27mn (legs only) ?  Seated lumbar flexion stretch 10s 6x ?  Supine SKTC 30s 3x ?  STS 5x5 ?Step up 4" box 2x10  ? Sidestepping at raised table 4x laps RTB at knees (no UE) ?Heel/toe Raises x 10 reps x 2 ? lateral lunge with UE support 10x each ? ? ?4/28 ?Exercises ?  Nustep L3: 755m (legs only) ?  5x5 STS no UE from raised table, 4lb weight ?  Suitcase carry 4lbs 7529f laps ?  Gait with head turns 43f50f laps ?  L LE LAQ 6lbs 3x10 ? ?At counter:  ? Step up 4" box 2x10  ? Sidestepping at raised table 4x laps RTB at knees (no UE) ? ? ?04/06/22 ?Exercises ?  Nustep L3: 7min37megs only) ?  5x5 STS no UE from raised table, 4lb weight ?  Shuttle leg press 50lbs 2x10 ?  L LE LAQ 5lbs 3x10 ? ?At counter:  ? Step up 4" box 2x10  ? Sidestepping at raised table 4x laps RTB at knees (no UE) ?Heel/toe Raises x 10 reps x 2 ? lateral lunge with UE support 10x each ? ?04/01/22 ?Exercises ?  Nustep L3: 7min 35mgs only) ?  5x5 STS no UE from raised table, focus on power concentric, slow eccentric; 2" block under L LE ? ?  L LE LAQ 5lbs 3x10 ? ?At counter:  ? Step up 4" box 2x10  ? Sidestepping at bar 3s laps YTB at knees (no UE) ?Heel/toe Raises x 10 reps x 2 ?Standing gastroc stretch 30s 2x ?  Semi tandem stance x 15 sec with intermittent UE to steady ?  Retro walking 3x  laps (intermittent UE) ?Fwd and lateral lunge with UE support 10x each ? ? ?PATIENT EDUCATION:  ?Education details: walking program, exercise progression, DOMS expectations, muscle firing,  envelope of function, HEP, POC

## 2022-04-22 DIAGNOSIS — Z85828 Personal history of other malignant neoplasm of skin: Secondary | ICD-10-CM | POA: Diagnosis not present

## 2022-04-22 DIAGNOSIS — I83213 Varicose veins of right lower extremity with both ulcer of ankle and inflammation: Secondary | ICD-10-CM | POA: Diagnosis not present

## 2022-04-22 DIAGNOSIS — L950 Livedoid vasculitis: Secondary | ICD-10-CM | POA: Diagnosis not present

## 2022-04-22 DIAGNOSIS — L97211 Non-pressure chronic ulcer of right calf limited to breakdown of skin: Secondary | ICD-10-CM | POA: Diagnosis not present

## 2022-04-27 ENCOUNTER — Encounter (HOSPITAL_BASED_OUTPATIENT_CLINIC_OR_DEPARTMENT_OTHER): Payer: Self-pay | Admitting: Physical Therapy

## 2022-04-27 ENCOUNTER — Ambulatory Visit (HOSPITAL_BASED_OUTPATIENT_CLINIC_OR_DEPARTMENT_OTHER): Payer: Medicare Other | Admitting: Physical Therapy

## 2022-04-27 DIAGNOSIS — R262 Difficulty in walking, not elsewhere classified: Secondary | ICD-10-CM | POA: Diagnosis not present

## 2022-04-27 DIAGNOSIS — M6281 Muscle weakness (generalized): Secondary | ICD-10-CM

## 2022-04-27 DIAGNOSIS — R2689 Other abnormalities of gait and mobility: Secondary | ICD-10-CM

## 2022-04-27 NOTE — Therapy (Signed)
?OUTPATIENT PHYSICAL THERAPY LOWER EXTREMITY TREATMENT ?  ? ? ?Patient Name: [name] ?MRN: [id number] ?DOB:Aug 09, 1933, 88 y.o., female ?Today's Date: 04/27/2022 ? ? PT End of Session - 04/27/22 1339   ? ? Visit Number 13   ? Number of Visits 21   ? Date for PT Re-Evaluation 06/01/22   ? Authorization Type UHC MCR   ? PT Start Time 1345   ? PT Stop Time 1420   ? PT Time Calculation (min) 35 min   ? Activity Tolerance Patient tolerated treatment well   ? Behavior During Therapy [facility] for tasks assessed/performed   ? ?  ?  ? ?  ? ? ? ? ? ? ? ?Past Medical History:  ?Diagnosis Date  ? Allergic rhinitis   ? Arthritis   ? bil knees  ? Breast cancer, left breast ([place]) 01/15/2015  ? Invasive Mammary  ? Chronic venous insufficiency   ? Hematuria   ? negative workup - Dr. Reece Agar  ? Hypertension   ? Hypothyroidism   ? Migraine headache   ? MVP (mitral valve prolapse)   ? OAB (overactive bladder)   ? Pacemaker   ? PAF (paroxysmal atrial fibrillation) ([place])   ? SVT (supraventricular tachycardia) ([place]) 06/2010  ? s/p AVNRT ablation  ? Wears glasses   ? ?Past Surgical History:  ?Procedure Laterality Date  ? A-FLUTTER ABLATION N/A 02/04/2017  ? Procedure: A-Flutter Ablation;  Surgeon: Evans Lance, MD;  Location: [place] [facility];  Service: Cardiovascular;  Laterality: N/A;  ? ABDOMINAL HYSTERECTOMY    ? AV NODE ABLATION N/A 08/17/2017  ? Procedure: AV Node Ablation;  Surgeon: Evans Lance, MD;  Location: [place] [facility];  Service: Cardiovascular;  Laterality: N/A;  ? BREAST LUMPECTOMY WITH RADIOACTIVE SEED LOCALIZATION Left 02/14/2015  ? Procedure: BREAST LUMPECTOMY WITH RADIOACTIVE SEED LOCALIZATION;  Surgeon: Alphonsa Overall, MD;  Location: [place];  Service: General;  Laterality: Left;  ? BREAST LUMPECTOMY WITH RADIOACTIVE SEED LOCALIZATION Left 11/18/2021  ? Procedure: LEFT BREAST LUMPECTOMY WITH RADIOACTIVE SEED LOCALIZATION;  Surgeon: Erroll Luna, MD;  Location: [place];   Service: General;  Laterality: Left;  ? [place]  ? rt br bx  ? CARDIAC CATHETERIZATION  2011  ? ablasion  ? CARDIOVERSION N/A 08/10/2017  ? Procedure: CARDIOVERSION;  Surgeon: Jerline Pain, MD;  Location: [facility] ENDOSCOPY;  Service: Cardiovascular;  Laterality: N/A;  ? COLONOSCOPY    ? DILATION AND CURETTAGE OF UTERUS    ? Left Breast Biopsy  01/15/15  ? PACEMAKER IMPLANT N/A 08/17/2017  ? Procedure: Pacemaker Implant;  Surgeon: Evans Lance, MD;  Location: [place] [facility];  Service: Cardiovascular;  Laterality: N/A;  ? TUBAL LIGATION    ? ?Patient Active Problem List  ? Diagnosis Date Noted  ? Chronic diastolic heart failure ([place]) 03/27/2021  ? PVC's (premature ventricular contractions) 09/23/2020  ? Pacemaker 01/10/2020  ? Pain in left knee 09/27/2018  ? Acute diastolic CHF (congestive heart failure) ([place]) 08/11/2017  ? Acute CHF (congestive heart failure) ([place]) 08/11/2017  ? Acute CHF ([place]) 08/11/2017  ? Persistent atrial fibrillation ([place])   ? ILD (interstitial lung disease) ([place]) 02/21/2017  ? CHF (congestive heart failure) ([place]) 02/21/2017  ? Dyspnea and respiratory abnormality 02/11/2017  ? Atrial flutter ([place]) 02/04/2017  ? Chronic atrial fibrillation ([place]) 01/19/2017  ? Abnormality of gait and mobility 01/19/2017  ? Hypothyroidism 01/19/2017  ? Other specified disorders of bone density and  structure, unspecified site 01/19/2017  ? Genetic testing 03/13/2015  ? Paresthesia of skin 03/03/2015  ? Breast cancer of lower-outer quadrant of left female breast ([place]) 02/06/2015  ? Hematuria   ? PAF (paroxysmal atrial fibrillation) ([place])   ? Chronic venous insufficiency   ? SVT (supraventricular tachycardia) ([place]) 06/12/2010  ? Essential hypertension 04/22/2010  ? MITRAL VALVE PROLAPSE 04/22/2010  ? Atypical atrial flutter ([place]) 04/22/2010  ? SUPRAVENTRICULAR TACHYCARDIA 04/22/2010  ? ? ?PCP: Lajean Manes, MD ? ?REFERRING PROVIDER: Lajean Manes, MD ? ?REFERRING DIAG: R26.9 (ICD-10-CM) - Unspecified  abnormalities of gait and mobility ? ?THERAPY DIAG:  ?Muscle weakness (generalized) ? ?Difficulty walking ? ?Other abnormalities of gait and mobility ? ?ONSET DATE: 2020 ? ?SUBJECTIVE:  ? ?SUBJECTIVE STATEMENT: ?Pt states she was informed she has had two new ulcers form on her leg. 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 She still has trouble getting out of the chair but she is moving much better than before. She is asking about ending therapy and continuing to work on her own with the HEP.  ? ?PERTINENT HISTORY: ?Pacemaker, CHF, venous insufficiency, dermatitis ? ?PAIN:  ?Are you having pain? Yes, 8/10 ?Rt lower lateral leg  ? ? ?PRECAUTIONS: NO ESTIM ? ?WEIGHT BEARING RESTRICTIONS No ? ?FALLS:  ?Has patient fallen in last 6 months? Yes, Number of falls: 1, fell trying ot get out of chair, had to crawl in order to get up, unable to get up from the floor ? ?LIVING ENVIRONMENT: ?Lives with: lives with their family ?Lives in: House/apartment ?Stairs: Yes;  ?Has following equipment at home: None ? ?OCCUPATION: retired ? ?PLOF: Independent with basic ADLs ? ?PATIENT GOALS : Pt would like to strengthen her legs.  ? ? ?OBJECTIVE:  ? ? ?PATIENT SURVEYS:  ?ABC 60% ? ?Progress Note 5/2: 80% ? ? ?LE MMT: ?  ?MMT Right ?04/13/2022 Left ?04/13/2022  ?Hip flexion 4+/5 4+/5  ?Hip extension      ?Hip abduction 4/5 4/5  ?Hip adduction 4+/5 4+/5  ?Knee flexion 4/5 4/5  ?Knee extension 4+/5 4+/5  ? (Blank rows = not tested) ?  ?  ?FUNCTIONAL TESTS:  ? ?5/2 Progress Note ?5 times sit to stand: 20s from table raised 2" above chair height ?Timed up and go (TUG): 15.s ?  ? ?GAIT: ?Distance walked: 9f ?Assistive device utilized: None ?Level of assistance: Complete Independence ?Comments: decreased step length, lack of hip extension ? ? ?TODAY'S TREATMENT: ? ?5/16 ?Exercises ?- Supine Bridge  - 1 x daily - 3-4 x weekly - 2 sets - 10 reps ?- Sit to Stand Without Arm Support  - 1 x daily - 3-4 x weekly - 2 sets - 10 reps ?- Squat with  Counter Support  - 1 x daily - 3-4 x weekly - 3 sets - 10 reps ?- Side Stepping with Resistance at Thighs and Counter Support  - 1 x daily - 3-4 x weekly - 1 sets - 3 reps - 374fhold ?- Tandem Stance with Support  - 1 x daily - 7 x weekly - 1 sets - 3 reps - 30s hold ?- Supine Single Knee to Chest Stretch  - 1 x daily - 7 x weekly - 1 sets - 3 reps - 30 hold ?- Backward Walking with Counter Support  - 1 x daily - 7 x weekly - 1 sets - 2 reps - 1578fold ? ?5/10 ?Exercises ?  Nustep L5: 8mi46mlegs only) ?  STS 3 x  10 5lbs (elevated table; weight at chest) ?  Step up 4" box alternating LE, x 5 reps each leg 2 sets, UE support on counter  ?Tandem balance 15s 2x each side with intermittent UE to steady ?Supine SKTC 30s 2x each leg ?  Bridge with 10 sec hold in hip ext x 5 ?Retro walking 3x laps, next to railing (intermittent UE)- cues to increase L step length ?Heel/toe raises x 15 ?Side stepping at railing with green band at thighs/ ankle x 2 laps ?  ?5/4 ?Exercises ?  Nustep L5: 36mn (legs only) ?  Supine SKTC 30s 3x ?  STS 3x10 5lbs  ?  LAQ L LE 7.5 lbs ?  ?Step up 4" box 2x10  ? Sidestepping at raised standing hip ABD RTB at knees 2x10 ?Heel/toe Raises x 10 reps x 2 ?Tandem balance 15s 2x each side ?  ?5/2 ?Exercises ?  Nustep L5: 744m (legs only) ?  Seated lumbar flexion stretch 10s 6x ?  Supine SKTC 30s 3x ?  STS 5x5 ?Step up 4" box 2x10  ? Sidestepping at raised table 4x laps RTB at knees (no UE) ?Heel/toe Raises x 10 reps x 2 ? lateral lunge with UE support 10x each ? ? ?4/28 ?Exercises ?  Nustep L3: 3m43m(legs only) ?  5x5 STS no UE from raised table, 4lb weight ?  Suitcase carry 4lbs 20f79flaps ?  Gait with head turns 20ft52flaps ?  L LE LAQ 6lbs 3x10 ? ?At counter:  ? Step up 4" box 2x10  ? Sidestepping at raised table 4x laps RTB at knees (no UE) ? ? ?04/06/22 ?Exercises ?  Nustep L3: 3min 73mgs only) ?  5x5 STS no UE from raised table, 4lb weight ?  Shuttle leg press 50lbs 2x10 ?  L LE LAQ 5lbs 3x10 ? ?At  counter:  ? Step up 4" box 2x10  ? Sidestepping at raised table 4x laps RTB at knees (no UE) ?Heel/toe Raises x 10 reps x 2 ? lateral lunge with UE support 10x each ? ?04/01/22 ?Exercises ?  Nustep L3: 3min (73m

## 2022-04-29 ENCOUNTER — Ambulatory Visit (HOSPITAL_BASED_OUTPATIENT_CLINIC_OR_DEPARTMENT_OTHER): Payer: Medicare Other | Admitting: Physical Therapy

## 2022-05-03 ENCOUNTER — Other Ambulatory Visit: Payer: Self-pay | Admitting: Internal Medicine

## 2022-05-05 ENCOUNTER — Ambulatory Visit (HOSPITAL_COMMUNITY): Payer: Medicare Other | Attending: Physician Assistant

## 2022-05-05 DIAGNOSIS — R0600 Dyspnea, unspecified: Secondary | ICD-10-CM | POA: Insufficient documentation

## 2022-05-05 DIAGNOSIS — R0602 Shortness of breath: Secondary | ICD-10-CM | POA: Diagnosis not present

## 2022-05-05 LAB — ECHOCARDIOGRAM COMPLETE
Area-P 1/2: 4.08 cm2
S' Lateral: 2.4 cm

## 2022-05-07 ENCOUNTER — Telehealth: Payer: Self-pay | Admitting: *Deleted

## 2022-05-07 NOTE — Telephone Encounter (Signed)
Spoke with patient  aware of results and verbalized understanding.

## 2022-05-07 NOTE — Telephone Encounter (Signed)
-----   Message from [place], [place] sent at 05/05/2022  2:28 PM EDT ----- Heart is strong, moves normal, no worrisome murmurs.  Mitral valve is a bit leaky, but nothing concerning at this time, we will keep track of her echos every couple years or so to monitor that

## 2022-05-25 ENCOUNTER — Ambulatory Visit (INDEPENDENT_AMBULATORY_CARE_PROVIDER_SITE_OTHER): Payer: Medicare Other

## 2022-05-25 DIAGNOSIS — Z950 Presence of cardiac pacemaker: Secondary | ICD-10-CM | POA: Diagnosis not present

## 2022-05-25 DIAGNOSIS — L308 Other specified dermatitis: Secondary | ICD-10-CM | POA: Diagnosis not present

## 2022-05-25 DIAGNOSIS — Z85828 Personal history of other malignant neoplasm of skin: Secondary | ICD-10-CM | POA: Diagnosis not present

## 2022-05-25 DIAGNOSIS — L97811 Non-pressure chronic ulcer of other part of right lower leg limited to breakdown of skin: Secondary | ICD-10-CM | POA: Diagnosis not present

## 2022-05-25 DIAGNOSIS — I872 Venous insufficiency (chronic) (peripheral): Secondary | ICD-10-CM | POA: Diagnosis not present

## 2022-05-25 LAB — CUP PACEART REMOTE DEVICE CHECK
Battery Remaining Longevity: 31 mo
Battery Voltage: 2.93 V
Brady Statistic AP VP Percent: 0 %
Brady Statistic AP VS Percent: 0 %
Brady Statistic AS VP Percent: 99.99 %
Brady Statistic AS VS Percent: 0.01 %
Brady Statistic RA Percent Paced: 0 %
Brady Statistic RV Percent Paced: 99.99 %
Date Time Interrogation Session: 20230612202423
Implantable Lead Implant Date: 20180905
Implantable Lead Implant Date: 20180905
Implantable Lead Location: 753859
Implantable Lead Location: 753860
Implantable Lead Model: 3830
Implantable Lead Model: 5076
Implantable Pulse Generator Implant Date: 20180905
Lead Channel Impedance Value: 285 Ohm
Lead Channel Impedance Value: 323 Ohm
Lead Channel Impedance Value: 361 Ohm
Lead Channel Impedance Value: 532 Ohm
Lead Channel Pacing Threshold Amplitude: 2.5 V
Lead Channel Pacing Threshold Pulse Width: 0.4 ms
Lead Channel Sensing Intrinsic Amplitude: 1.5 mV
Lead Channel Sensing Intrinsic Amplitude: 1.5 mV
Lead Channel Sensing Intrinsic Amplitude: 2.875 mV
Lead Channel Sensing Intrinsic Amplitude: 3.125 mV
Lead Channel Setting Pacing Amplitude: 2.5 V
Lead Channel Setting Pacing Pulse Width: 1 ms
Lead Channel Setting Sensing Sensitivity: 0.9 mV

## 2022-05-26 ENCOUNTER — Encounter: Payer: Medicare Other | Admitting: Physician Assistant

## 2022-05-26 DIAGNOSIS — L97219 Non-pressure chronic ulcer of right calf with unspecified severity: Secondary | ICD-10-CM | POA: Diagnosis not present

## 2022-05-26 DIAGNOSIS — I872 Venous insufficiency (chronic) (peripheral): Secondary | ICD-10-CM | POA: Diagnosis not present

## 2022-05-27 ENCOUNTER — Encounter: Payer: Medicare Other | Admitting: Physician Assistant

## 2022-06-04 NOTE — Progress Notes (Signed)
Cardiology Office Note Date:  06/04/2022  Patient ID:  Tracy, Green 1933/06/10, MRN [id number] PCP:  Merlene Laughter, MD  Electrophysiologist: Dr. Ladona Ridgel     Chief Complaint:  4-6 weeks  History of Present Illness: [name] is a 88 y.o. female with history of SVT (s/p AVNRT ablation), AFlutter > Afib >> AV node ablation/PPM, PVCs (intolerant of flecainide and multaq) on amiodarone, venous insufficiency, HTN.  She comes today to be seen for Dr. Ladona Ridgel, last seen by him April 2022, PVCs/symptoms improved on amiodarone, no changes were made.  I saw her 09/30/2021 She feels like she is doing quite well. This summer had a nose bleed that required an ER visit and ENT follow up.  No particular findings that she recalls, had packing a few days and has not happened again Denies any CP, palpitations or SOB No syncope She does not exercise but stays busy and active Discussed reducing her amio dose though she did not want ti Planned for labs and 51mo f/u RV lead threshold stable from prior in clinic thresholds, auto testing reported "high" and RV lead outputs adjusted and turned to monitor  I Saw her 04/14/22 She is accompanied by her husband. She has started PT for some generalized weakness and they both have started walking as part of this. She has noticed with increased pace/duration she is getting winded.  No CP, no palpitations. Her husband inquires if there is any programming that can give her a better HR for exercise. She denies any near syncope or syncope. She continues to get intermittent nose bleeds, has not required any ER visits to get them to stop, but sometimes does take a while. Her PMD recommended that she start to use saline nasal spray and since then none further so far. Suspected her SOB was multifactorial Pacer rate response adjusted Planned for an echo, amio labs  Echo noted LVEF 60-65%, no WMA, mild conc LVH, mil elevated RVSP 39.5, mod MR/TR Labs  ok  TODAY She is alone today.  She is doing better, she mentions her husband mentioned of late that she is stoppig or slowing down less frequently They both continue to walks and she does have improved endurance. No CP, palpitations or cardiac awareness  No syncope No bleeding  She is again battling LE wound, her dermatologist is managing currently, is LLE. She wears compression wraps chronically and says this has been an recurrent problem for her Has taken in the past months to help with the wound clinic perviosuly  Has been started on [place]  Device information MDT dual chamber PPM implanted 08/17/2017 Programmed VVIR S/p AV node ablation  AAD hx Amiodarone for PVCs started Nov 2021  Past Medical History:  Diagnosis Date   Allergic rhinitis    Arthritis    bil knees   Breast cancer, left breast ([facility]) 01/15/2015   Invasive Mammary   Chronic venous insufficiency    Hematuria    negative workup - Dr. Wanda Plump   Hypertension    Hypothyroidism    Migraine headache    MVP (mitral valve prolapse)    OAB (overactive bladder)    Pacemaker    PAF (paroxysmal atrial fibrillation) ([facility])    SVT (supraventricular tachycardia) ([facility]) 06/2010   s/p AVNRT ablation   Wears glasses     Past Surgical History:  Procedure Laterality Date   A-FLUTTER ABLATION N/A 02/04/2017   Procedure: A-Flutter Ablation;  Surgeon: Marinus Maw, MD;  Location: [facility] INVASIVE CV  LAB;  Service: Cardiovascular;  Laterality: N/A;   ABDOMINAL HYSTERECTOMY     AV NODE ABLATION N/A 08/17/2017   Procedure: AV Node Ablation;  Surgeon: Marinus Maw, MD;  Location: [facility] INVASIVE CV LAB;  Service: Cardiovascular;  Laterality: N/A;   BREAST LUMPECTOMY WITH RADIOACTIVE SEED LOCALIZATION Left 02/14/2015   Procedure: BREAST LUMPECTOMY WITH RADIOACTIVE SEED LOCALIZATION;  Surgeon: Ovidio Kin, MD;  Location: [facility];  Service: General;  Laterality: Left;   BREAST LUMPECTOMY WITH RADIOACTIVE SEED  LOCALIZATION Left 11/18/2021   Procedure: LEFT BREAST LUMPECTOMY WITH RADIOACTIVE SEED LOCALIZATION;  Surgeon: Harriette Bouillon, MD;  Location: [facility];  Service: General;  Laterality: Left;   BREAST SURGERY  1990   rt br bx   CARDIAC CATHETERIZATION  2011   ablasion   CARDIOVERSION N/A 08/10/2017   Procedure: CARDIOVERSION;  Surgeon: Jake Bathe, MD;  Location: [facility]OSCOPY;  Service: Cardiovascular;  Laterality: N/A;   COLONOSCOPY     DILATION AND CURETTAGE OF UTERUS     Left Breast Biopsy  01/15/15   PACEMAKER IMPLANT N/A 08/17/2017   Procedure: Pacemaker Implant;  Surgeon: Marinus Maw, MD;  Location: [facility] INVASIVE CV LAB;  Service: Cardiovascular;  Laterality: N/A;   TUBAL LIGATION      Current Outpatient Medications  Medication Sig Dispense Refill   amiodarone (PACERONE) 200 MG tablet Take 0.5 tablets (100 mg total) by mouth daily. 45 tablet 3   Carboxymethylcellul-Glycerin (LUBRICATING EYE DROPS OP) Apply 1 drop to eye daily as needed (dry eyes).     ELIQUIS 5 MG TABS tablet TAKE 1 TABLET BY MOUTH  TWICE DAILY 180 tablet 3   hydrochlorothiazide (HYDRODIURIL) 25 MG tablet TAKE ONE TABLET ONCE DAILY. 90 tablet 3   levothyroxine (SYNTHROID) 100 MCG tablet Take 100 mcg by mouth daily before breakfast.     losartan (COZAAR) 50 MG tablet TAKE 1 TABLET ONCE DAILY. 90 tablet 3   metoprolol succinate (TOPROL-XL) 25 MG 24 hr tablet Take 1 tablet (25 mg total) by mouth daily. 30 tablet 11   solifenacin (VESICARE) 5 MG tablet Take 5 mg by mouth daily.     triamcinolone cream (KENALOG) 0.1 % Apply topically as needed for rash.     Vitamin D, Ergocalciferol, (DRISDOL) 50000 units CAPS capsule Take 50,000 Units by mouth every 7 (seven) days.     No current facility-administered medications for this visit.    Allergies:   Adhesive [tape], Codeine, Latex, and Percodan [oxycodone-aspirin]   Social History:  The patient  reports that she has never smoked. She has never used  smokeless tobacco. She reports current alcohol use. She reports that she does not use drugs.   Family History:  The patient's family history includes Alzheimer's disease in her sister; Arthritis in her mother; Breast cancer in her cousin, paternal aunt, and sister; Breast cancer (age of onset: 37) in her daughter; Cancer in her paternal aunt; Heart Problems in her sister; Heart attack in her brother; Heart disease in her mother; Hypertension in her mother; Other in her father; Pulmonary disease in her sister; Throat cancer in her paternal uncle.  ROS:  Please see the history of present illness.    All other systems are reviewed and otherwise negative.   PHYSICAL EXAM:  VS:  LMP  (LMP Unknown)  BMI: There is no height or weight on file to calculate BMI. Well nourished, well developed, in no acute distress HEENT: normocephalic, atraumatic Neck: no JVD, carotid bruits or  masses Cardiac:   RRR; (paced) no significant murmurs, no rubs, or gallops Lungs:  CTA b/l, no wheezing, rhonchi or rales Abd: soft, nontender MS: age appropriate atrophy Ext: she has compression wraps on b/l, no edema noted above or below them Skin: warm and dry, no rash Neuro:  No gross deficits appreciated Psych: euthymic mood, full affect  PPM site is stable, no tethering or discomfort   EKG:  not done today  Device interrogation done today and reviewed by myself:  Battery and lead measurements are stable Patient activity is significantly improved HR histogram with some improvement as well No HVR episodes  05/05/22: TTE  1. Left ventricular ejection fraction, by estimation, is 60 to 65%. The  left ventricle has normal function. The left ventricle has no regional  wall motion abnormalities. There is mild concentric left ventricular  hypertrophy. Left ventricular diastolic  function could not be evaluated.   2. Right ventricular systolic function is normal. The right ventricular  size is normal. There is mildly  elevated pulmonary artery systolic  pressure. The estimated right ventricular systolic pressure is 39.5 mmHg.   3. Left atrial size was severely dilated.   4. Right atrial size was severely dilated.   5. The mitral valve is myxomatous. Moderate mitral valve regurgitation.  There is mild late systolic prolapse of both leaflets of the mitral valve.   6. Tricuspid valve regurgitation is moderate.   7. The aortic valve is tricuspid. There is mild calcification of the  aortic valve. There is mild thickening of the aortic valve. Aortic valve  regurgitation is not visualized. Aortic valve sclerosis/calcification is  present, without any evidence of  aortic stenosis.   8. There is borderline dilatation of the ascending aorta, measuring 38  mm.    08/11/2017: TTE Study Conclusions  - Left ventricle: The cavity size was normal. Systolic function was    normal. The estimated ejection fraction was in the range of 55%    to 60%. Wall motion was normal; there were no regional wall    motion abnormalities. Features are consistent with a pseudonormal    left ventricular filling pattern, with concomitant abnormal    relaxation and increased filling pressure (grade 2 diastolic    dysfunction). Doppler parameters are consistent with high    ventricular filling pressure.  - Aortic valve: There was no regurgitation.  - Mitral valve: Transvalvular velocity was within the normal range.    There was no evidence for stenosis. There was trivial    regurgitation.  - Left atrium: The atrium was severely dilated.  - Right ventricle: The cavity size was normal. Wall thickness was    normal. Systolic function was normal.  - Tricuspid valve: There was no regurgitation.  - Pericardium, extracardiac: There was a left pleural effusion.    02/04/2017; EPS/ablation/DCCV Conclusion: Invasive EP study demonstrating left atrial flutter as well as AV node reentrant tachycardia with successful DC cardioversion back to  sinus rhythm. The patient will be treated with medical therapy and ongoing anticoagulation.  Recent Labs: 11/12/2021: BUN 24; Creatinine, Ser 1.04; Potassium 3.7; Sodium 133 04/14/2022: ALT 11; Hemoglobin 11.8; Platelets 204; TSH 2.400  No results found for requested labs within last 365 days.   CrCl cannot be calculated (Patient's most recent lab result is older than the maximum 21 days allowed.).   Wt Readings from Last 3 Encounters:  04/14/22 166 lb (75.3 kg)  12/03/21 163 lb 1.6 oz (74 kg)  11/18/21 165 lb 12.6 oz (  75.2 kg)     Other studies reviewed: Additional studies/records reviewed today include: summarized above  ASSESSMENT AND PLAN:  Permanent Afib, flutter S/p AVnode ablation CHA2DS2Vasc is 4, on eliquis, appropriately dosed   2. PPM Intact function No programming changes made  3. HTN A recheck by myself is 110/80   4. PVCs Amiodarone, low dose No palpitations Labs are up to date  5. DOE Suspect perhaps deconditioned state better   Disposition: remotes as usual, in clinic with EP in 6 mo, sooner if needed.  Current medicines are reviewed at length with the patient today.  The patient did not have any concerns regarding medicines.  Tracy Fredrickson, PA-C 06/04/2022 2:11 PM     [facility] [address] [place] [address] [phone] (office)  [phone] (fax)

## 2022-06-07 ENCOUNTER — Inpatient Hospital Stay: Payer: Medicare Other | Attending: Adult Health | Admitting: Adult Health

## 2022-06-07 ENCOUNTER — Ambulatory Visit (INDEPENDENT_AMBULATORY_CARE_PROVIDER_SITE_OTHER): Payer: Medicare Other | Admitting: Physician Assistant

## 2022-06-07 ENCOUNTER — Other Ambulatory Visit: Payer: Self-pay

## 2022-06-07 ENCOUNTER — Encounter: Payer: Self-pay | Admitting: Physician Assistant

## 2022-06-07 ENCOUNTER — Encounter: Payer: Self-pay | Admitting: Adult Health

## 2022-06-07 VITALS — BP 100/62 | HR 95 | Ht 68.0 in | Wt 163.2 lb

## 2022-06-07 VITALS — BP 105/69 | HR 88 | Temp 97.4°F | Resp 18 | Ht 68.0 in | Wt 163.2 lb

## 2022-06-07 DIAGNOSIS — C50512 Malignant neoplasm of lower-outer quadrant of left female breast: Secondary | ICD-10-CM | POA: Insufficient documentation

## 2022-06-07 DIAGNOSIS — Z950 Presence of cardiac pacemaker: Secondary | ICD-10-CM | POA: Diagnosis not present

## 2022-06-07 DIAGNOSIS — I10 Essential (primary) hypertension: Secondary | ICD-10-CM | POA: Diagnosis not present

## 2022-06-07 DIAGNOSIS — Z79811 Long term (current) use of aromatase inhibitors: Secondary | ICD-10-CM | POA: Insufficient documentation

## 2022-06-07 DIAGNOSIS — I493 Ventricular premature depolarization: Secondary | ICD-10-CM | POA: Diagnosis not present

## 2022-06-07 DIAGNOSIS — Z170 Estrogen receptor positive status [ER+]: Secondary | ICD-10-CM | POA: Diagnosis not present

## 2022-06-07 DIAGNOSIS — I4821 Permanent atrial fibrillation: Secondary | ICD-10-CM | POA: Diagnosis not present

## 2022-06-07 LAB — CUP PACEART INCLINIC DEVICE CHECK
Battery Remaining Longevity: 30 mo
Battery Voltage: 2.93 V
Brady Statistic AP VP Percent: 0 %
Brady Statistic AP VS Percent: 0 %
Brady Statistic AS VP Percent: 99.99 %
Brady Statistic AS VS Percent: 0.01 %
Brady Statistic RA Percent Paced: 0 %
Brady Statistic RV Percent Paced: 99.99 %
Date Time Interrogation Session: 20230626132808
Implantable Lead Implant Date: 20180905
Implantable Lead Implant Date: 20180905
Implantable Lead Location: 753859
Implantable Lead Location: 753860
Implantable Lead Model: 3830
Implantable Lead Model: 5076
Implantable Pulse Generator Implant Date: 20180905
Lead Channel Impedance Value: 285 Ohm
Lead Channel Impedance Value: 342 Ohm
Lead Channel Impedance Value: 399 Ohm
Lead Channel Impedance Value: 551 Ohm
Lead Channel Pacing Threshold Amplitude: 2.5 V
Lead Channel Pacing Threshold Pulse Width: 0.4 ms
Lead Channel Sensing Intrinsic Amplitude: 1.5 mV
Lead Channel Sensing Intrinsic Amplitude: 1.5 mV
Lead Channel Sensing Intrinsic Amplitude: 2.875 mV
Lead Channel Sensing Intrinsic Amplitude: 3.125 mV
Lead Channel Setting Pacing Amplitude: 2.5 V
Lead Channel Setting Pacing Pulse Width: 1 ms
Lead Channel Setting Sensing Sensitivity: 0.9 mV

## 2022-06-07 NOTE — Assessment & Plan Note (Signed)
[name] is here today for follow-up of her history of recurrent left-sided breast cancer.  She has opted to forego antiestrogen therapy receiving surgery alone.  She is due for mammogram I believe in November 2023.  We are awaiting mammogram results from last year from [facility].  I discussed with [name] a recommendation for her to continue with annual diagnostic mammograms for at least 2 years following breast cancer surgery and then go to annual mammograms.  I also recommended annual clinical breast exam at our office.  We discussed healthy diet and exercise as additional ways to decrease her risk of recurrence.  I recommended that she continue to follow-up with her primary care provider at least annually.  [name] will return as noted above.  She knows to call for any questions or concerns that may arise between now and her next visit we are always happy to see her sooner.

## 2022-06-09 NOTE — Progress Notes (Signed)
Remote pacemaker transmission.   

## 2022-06-29 DIAGNOSIS — I87313 Chronic venous hypertension (idiopathic) with ulcer of bilateral lower extremity: Secondary | ICD-10-CM | POA: Diagnosis not present

## 2022-07-01 DIAGNOSIS — L97211 Non-pressure chronic ulcer of right calf limited to breakdown of skin: Secondary | ICD-10-CM | POA: Diagnosis not present

## 2022-07-01 DIAGNOSIS — I8312 Varicose veins of left lower extremity with inflammation: Secondary | ICD-10-CM | POA: Diagnosis not present

## 2022-07-01 DIAGNOSIS — L308 Other specified dermatitis: Secondary | ICD-10-CM | POA: Diagnosis not present

## 2022-07-01 DIAGNOSIS — I872 Venous insufficiency (chronic) (peripheral): Secondary | ICD-10-CM | POA: Diagnosis not present

## 2022-07-01 DIAGNOSIS — I8311 Varicose veins of right lower extremity with inflammation: Secondary | ICD-10-CM | POA: Diagnosis not present

## 2022-07-01 DIAGNOSIS — Z85828 Personal history of other malignant neoplasm of skin: Secondary | ICD-10-CM | POA: Diagnosis not present

## 2022-07-20 DIAGNOSIS — C50912 Malignant neoplasm of unspecified site of left female breast: Secondary | ICD-10-CM | POA: Diagnosis not present

## 2022-07-21 DIAGNOSIS — I10 Essential (primary) hypertension: Secondary | ICD-10-CM | POA: Diagnosis not present

## 2022-08-04 DIAGNOSIS — R2689 Other abnormalities of gait and mobility: Secondary | ICD-10-CM | POA: Diagnosis not present

## 2022-08-04 DIAGNOSIS — I10 Essential (primary) hypertension: Secondary | ICD-10-CM | POA: Diagnosis not present

## 2022-08-18 DIAGNOSIS — L97811 Non-pressure chronic ulcer of other part of right lower leg limited to breakdown of skin: Secondary | ICD-10-CM | POA: Diagnosis not present

## 2022-08-18 DIAGNOSIS — Z85828 Personal history of other malignant neoplasm of skin: Secondary | ICD-10-CM | POA: Diagnosis not present

## 2022-08-18 DIAGNOSIS — L950 Livedoid vasculitis: Secondary | ICD-10-CM | POA: Diagnosis not present

## 2022-08-20 DIAGNOSIS — R8271 Bacteriuria: Secondary | ICD-10-CM | POA: Diagnosis not present

## 2022-08-20 DIAGNOSIS — N3000 Acute cystitis without hematuria: Secondary | ICD-10-CM | POA: Diagnosis not present

## 2022-08-24 ENCOUNTER — Ambulatory Visit (INDEPENDENT_AMBULATORY_CARE_PROVIDER_SITE_OTHER): Payer: Medicare Other

## 2022-08-24 DIAGNOSIS — I482 Chronic atrial fibrillation, unspecified: Secondary | ICD-10-CM | POA: Diagnosis not present

## 2022-08-27 LAB — CUP PACEART REMOTE DEVICE CHECK
Battery Remaining Longevity: 29 mo
Battery Voltage: 2.93 V
Brady Statistic AP VP Percent: 0 %
Brady Statistic AP VS Percent: 0 %
Brady Statistic AS VP Percent: 99.99 %
Brady Statistic AS VS Percent: 0.02 %
Brady Statistic RA Percent Paced: 0 %
Brady Statistic RV Percent Paced: 99.99 %
Date Time Interrogation Session: 20230911203105
Implantable Lead Implant Date: 20180905
Implantable Lead Implant Date: 20180905
Implantable Lead Location: 753859
Implantable Lead Location: 753860
Implantable Lead Model: 3830
Implantable Lead Model: 5076
Implantable Pulse Generator Implant Date: 20180905
Lead Channel Impedance Value: 266 Ohm
Lead Channel Impedance Value: 342 Ohm
Lead Channel Impedance Value: 380 Ohm
Lead Channel Impedance Value: 551 Ohm
Lead Channel Pacing Threshold Amplitude: 0.875 V
Lead Channel Pacing Threshold Pulse Width: 0.4 ms
Lead Channel Sensing Intrinsic Amplitude: 1.5 mV
Lead Channel Sensing Intrinsic Amplitude: 1.5 mV
Lead Channel Sensing Intrinsic Amplitude: 2.875 mV
Lead Channel Sensing Intrinsic Amplitude: 3.125 mV
Lead Channel Setting Pacing Amplitude: 2.5 V
Lead Channel Setting Pacing Pulse Width: 1 ms
Lead Channel Setting Sensing Sensitivity: 0.9 mV

## 2022-09-03 DIAGNOSIS — R8271 Bacteriuria: Secondary | ICD-10-CM | POA: Diagnosis not present

## 2022-09-08 DIAGNOSIS — I10 Essential (primary) hypertension: Secondary | ICD-10-CM | POA: Diagnosis not present

## 2022-09-08 DIAGNOSIS — E039 Hypothyroidism, unspecified: Secondary | ICD-10-CM | POA: Diagnosis not present

## 2022-09-08 DIAGNOSIS — K219 Gastro-esophageal reflux disease without esophagitis: Secondary | ICD-10-CM | POA: Diagnosis not present

## 2022-09-08 DIAGNOSIS — J849 Interstitial pulmonary disease, unspecified: Secondary | ICD-10-CM | POA: Diagnosis not present

## 2022-09-08 DIAGNOSIS — I872 Venous insufficiency (chronic) (peripheral): Secondary | ICD-10-CM | POA: Diagnosis not present

## 2022-09-08 DIAGNOSIS — Z0000 Encounter for general adult medical examination without abnormal findings: Secondary | ICD-10-CM | POA: Diagnosis not present

## 2022-09-08 DIAGNOSIS — I700 Atherosclerosis of aorta: Secondary | ICD-10-CM | POA: Diagnosis not present

## 2022-09-08 DIAGNOSIS — D6869 Other thrombophilia: Secondary | ICD-10-CM | POA: Diagnosis not present

## 2022-09-08 DIAGNOSIS — I5030 Unspecified diastolic (congestive) heart failure: Secondary | ICD-10-CM | POA: Diagnosis not present

## 2022-09-08 DIAGNOSIS — Z23 Encounter for immunization: Secondary | ICD-10-CM | POA: Diagnosis not present

## 2022-09-08 DIAGNOSIS — R7303 Prediabetes: Secondary | ICD-10-CM | POA: Diagnosis not present

## 2022-09-08 DIAGNOSIS — Z79899 Other long term (current) drug therapy: Secondary | ICD-10-CM | POA: Diagnosis not present

## 2022-09-08 DIAGNOSIS — I4891 Unspecified atrial fibrillation: Secondary | ICD-10-CM | POA: Diagnosis not present

## 2022-09-09 DIAGNOSIS — I10 Essential (primary) hypertension: Secondary | ICD-10-CM | POA: Diagnosis not present

## 2022-09-09 NOTE — Progress Notes (Signed)
Remote pacemaker transmission.   

## 2022-10-18 ENCOUNTER — Other Ambulatory Visit: Payer: Self-pay

## 2022-10-18 ENCOUNTER — Emergency Department (HOSPITAL_COMMUNITY): Payer: Medicare Other

## 2022-10-18 ENCOUNTER — Emergency Department (HOSPITAL_COMMUNITY)
Admission: EM | Admit: 2022-10-18 | Discharge: 2022-10-18 | Disposition: A | Discharge status: Home / Self Care | Payer: Medicare Other | Attending: Emergency Medicine | Admitting: Emergency Medicine

## 2022-10-18 ENCOUNTER — Encounter (HOSPITAL_COMMUNITY): Payer: Self-pay

## 2022-10-18 DIAGNOSIS — S40211A Abrasion of right shoulder, initial encounter: Secondary | ICD-10-CM | POA: Insufficient documentation

## 2022-10-18 DIAGNOSIS — M19011 Primary osteoarthritis, right shoulder: Secondary | ICD-10-CM | POA: Diagnosis not present

## 2022-10-18 DIAGNOSIS — Z79899 Other long term (current) drug therapy: Secondary | ICD-10-CM | POA: Diagnosis not present

## 2022-10-18 DIAGNOSIS — I739 Peripheral vascular disease, unspecified: Secondary | ICD-10-CM | POA: Diagnosis not present

## 2022-10-18 DIAGNOSIS — W2209XA Striking against other stationary object, initial encounter: Secondary | ICD-10-CM | POA: Diagnosis not present

## 2022-10-18 DIAGNOSIS — M25461 Effusion, right knee: Secondary | ICD-10-CM | POA: Diagnosis not present

## 2022-10-18 DIAGNOSIS — S8991XA Unspecified injury of right lower leg, initial encounter: Secondary | ICD-10-CM

## 2022-10-18 DIAGNOSIS — Z91040 Latex allergy status: Secondary | ICD-10-CM | POA: Diagnosis not present

## 2022-10-18 DIAGNOSIS — S99922A Unspecified injury of left foot, initial encounter: Secondary | ICD-10-CM | POA: Diagnosis present

## 2022-10-18 DIAGNOSIS — S0990XA Unspecified injury of head, initial encounter: Secondary | ICD-10-CM | POA: Diagnosis not present

## 2022-10-18 DIAGNOSIS — S80212A Abrasion, left knee, initial encounter: Secondary | ICD-10-CM | POA: Diagnosis not present

## 2022-10-18 DIAGNOSIS — S8001XA Contusion of right knee, initial encounter: Secondary | ICD-10-CM | POA: Diagnosis not present

## 2022-10-18 DIAGNOSIS — S92902A Unspecified fracture of left foot, initial encounter for closed fracture: Secondary | ICD-10-CM | POA: Insufficient documentation

## 2022-10-18 DIAGNOSIS — M1711 Unilateral primary osteoarthritis, right knee: Secondary | ICD-10-CM | POA: Diagnosis not present

## 2022-10-18 DIAGNOSIS — T07XXXA Unspecified multiple injuries, initial encounter: Secondary | ICD-10-CM

## 2022-10-18 DIAGNOSIS — I10 Essential (primary) hypertension: Secondary | ICD-10-CM | POA: Diagnosis not present

## 2022-10-18 DIAGNOSIS — S80211A Abrasion, right knee, initial encounter: Secondary | ICD-10-CM | POA: Diagnosis not present

## 2022-10-18 DIAGNOSIS — S92812A Other fracture of left foot, initial encounter for closed fracture: Secondary | ICD-10-CM | POA: Diagnosis not present

## 2022-10-18 DIAGNOSIS — S3993XA Unspecified injury of pelvis, initial encounter: Secondary | ICD-10-CM | POA: Diagnosis not present

## 2022-10-18 DIAGNOSIS — Z7901 Long term (current) use of anticoagulants: Secondary | ICD-10-CM | POA: Diagnosis not present

## 2022-10-18 DIAGNOSIS — M7989 Other specified soft tissue disorders: Secondary | ICD-10-CM | POA: Diagnosis not present

## 2022-10-18 DIAGNOSIS — X58XXXA Exposure to other specified factors, initial encounter: Secondary | ICD-10-CM

## 2022-10-18 LAB — I-STAT CHEM 8, ED
BUN: 27 mg/dL — ABNORMAL HIGH (ref 8–23)
Calcium, Ion: 1.12 mmol/L — ABNORMAL LOW (ref 1.15–1.40)
Chloride: 103 mmol/L (ref 98–111)
Creatinine, Ser: 0.9 mg/dL (ref 0.44–1.00)
Glucose, Bld: 84 mg/dL (ref 70–99)
HCT: 36 % (ref 36.0–46.0)
Hemoglobin: 12.2 g/dL (ref 12.0–15.0)
Potassium: 4.7 mmol/L (ref 3.5–5.1)
Sodium: 137 mmol/L (ref 135–145)
TCO2: 25 mmol/L (ref 22–32)

## 2022-10-18 LAB — COMPREHENSIVE METABOLIC PANEL
ALT: 16 U/L (ref 0–44)
AST: 25 U/L (ref 15–41)
Albumin: 3.7 g/dL (ref 3.5–5.0)
Alkaline Phosphatase: 105 U/L (ref 38–126)
Anion gap: 13 (ref 5–15)
BUN: 18 mg/dL (ref 8–23)
CO2: 24 mmol/L (ref 22–32)
Calcium: 9.1 mg/dL (ref 8.9–10.3)
Chloride: 102 mmol/L (ref 98–111)
Creatinine, Ser: 0.94 mg/dL (ref 0.44–1.00)
GFR, Estimated: 59 mL/min — ABNORMAL LOW (ref >60)
Glucose, Bld: 95 mg/dL (ref 70–99)
Potassium: 4.2 mmol/L (ref 3.5–5.1)
Sodium: 139 mmol/L (ref 135–145)
Total Bilirubin: 0.5 mg/dL (ref 0.3–1.2)
Total Protein: 7.4 g/dL (ref 6.5–8.1)

## 2022-10-18 LAB — PROTIME-INR
INR: 1.3 — ABNORMAL HIGH (ref 0.8–1.2)
Prothrombin Time: 16.4 seconds — ABNORMAL HIGH (ref 11.4–15.2)

## 2022-10-18 LAB — CBC
HCT: 35.5 % — ABNORMAL LOW (ref 36.0–46.0)
Hemoglobin: 11.8 g/dL — ABNORMAL LOW (ref 12.0–15.0)
MCH: 30.3 pg (ref 26.0–34.0)
MCHC: 33.2 g/dL (ref 30.0–36.0)
MCV: 91 fL (ref 80.0–100.0)
Platelets: 233 10*3/uL (ref 150–400)
RBC: 3.9 MIL/uL (ref 3.87–5.11)
RDW: 13.6 % (ref 11.5–15.5)
WBC: 8.8 10*3/uL (ref 4.0–10.5)
nRBC: 0 % (ref 0.0–0.2)

## 2022-10-18 LAB — ETHANOL: Alcohol, Ethyl (B): <10 mg/dL (ref <10)

## 2022-10-18 LAB — SAMPLE TO BLOOD BANK

## 2022-10-18 LAB — LACTIC ACID, PLASMA: Lactic Acid, Venous: 1.8 mmol/L (ref 0.5–1.9)

## 2022-10-18 MED ORDER — HYDROCODONE-ACETAMINOPHEN 5-325 MG PO TABS: 2.0000 | ORAL_TABLET | ORAL | 0 refills | Status: DC | PRN

## 2022-10-18 MED ORDER — HYDROCODONE-ACETAMINOPHEN 5-325 MG PO TABS
1.0000 | ORAL_TABLET | Freq: Once | ORAL | Status: AC
  Administered 2022-10-18: 1 via ORAL
  Filled 2022-10-18: qty 1

## 2022-10-18 MED ORDER — LACTATED RINGERS IV BOLUS
500.0000 mL | Freq: Once | INTRAVENOUS | Status: AC
  Administered 2022-10-18: 500 mL via INTRAVENOUS

## 2022-10-18 MED ORDER — FENTANYL CITRATE PF 50 MCG/ML IJ SOSY
PREFILLED_SYRINGE | INTRAMUSCULAR | Status: AC
  Administered 2022-10-18: 25 ug
  Filled 2022-10-18: qty 1

## 2022-10-18 MED ORDER — LIDOCAINE 5 % EX PTCH
1.0000 | MEDICATED_PATCH | CUTANEOUS | Status: DC
  Administered 2022-10-18: 1 via TRANSDERMAL
  Filled 2022-10-18: qty 1

## 2022-10-18 MED ORDER — FENTANYL CITRATE PF 50 MCG/ML IJ SOSY
12.5000 ug | PREFILLED_SYRINGE | Freq: Once | INTRAMUSCULAR | Status: AC
  Administered 2022-10-18: 12.5 ug via INTRAVENOUS
  Filled 2022-10-18: qty 1
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
## 2022-10-18 NOTE — ED Notes (Signed)
Patient verbalizes understanding of discharge instructions. Opportunity for questioning and answers were provided. Armband removed by staff, pt discharged from ED. Pt taken to ED entrance via wheel chair.  
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
## 2022-10-18 NOTE — ED Notes (Signed)
MD Philip Aspen made aware of BP.

## 2022-10-18 NOTE — Progress Notes (Signed)
Orthopedic Tech Progress Note Patient Details:  [name] [birth year] [id number]  Ortho Devices Type of Ortho Device: Knee Sleeve Ortho Device/Splint Location: rle. hinged knee sleeve. Ortho Device/Splint Interventions: Ordered, Application, Adjustment   Post Interventions Patient Tolerated: Well Instructions Provided: Care of device, Adjustment of device  Karolee Stamps 10/18/2022, 10:37 PM

## 2022-10-18 NOTE — ED Provider Notes (Signed)
  Physical Exam  BP 114/73   Pulse 69   Temp (!) 97.5 F (36.4 C) (Oral)   Resp 16   Ht '5\' 9"'$  (1.753 m)   Wt 70.3 kg   LMP  (LMP Unknown)   SpO2 95%   BMI 22.89 kg/m   Physical Exam  Procedures  Procedures  ED Course / MDM   Clinical Course as of 10/19/22 0008  Mon Oct 18, 2022  1646 Assumed care from Dr Vanita Panda. 88 yo F on [facility] who presented after fall. Trauma workup revealed foot fracture. Will get boot. Obtaining CT knee to eval for tibial plateau fracture.  [RP]  0762 CT scan reveals hematoma as well as possible cortical irregularity concerning for possible occult fracture.  They recommend MRI if pacemaker is compatible.  Per chart review pacemaker is [employer] Device: Rosann Auerbach DR MRI [id number] Serial Number: [id number].  [RP]  2150 MRI has called and states that the machine is broken and will not be up and running tonight.  Consulted Ortho. [RP]  2201 Reviewed CT scan with Dr. Lucia Gaskins from orthopedics.  He feels that it is unlikely that she has a tibial plateau fracture.  States that if it is present it is likely nonoperative.  Recommends her to get a hinged knee brace and follow-up with as outpatient.  States that she can be weightbearing as tolerated. [RP]  Tue Oct 19, 2022  0007 Patient was able to ambulate with the assistance of a walker.  She was given a hinged knee brace and a walker to go home with.  Also given short course of Norco to take for pain.  Instructed her to ice her knee and elevate it to prevent worsening swelling.  We will have her follow-up with orthopedics as an outpatient and her primary doctor in 2 to 3 days.  Return precautions discussed prior to discharge. [RP]    Clinical Course User Index [RP] Fransico Meadow, MD   Medical Decision Making Amount and/or Complexity of Data Reviewed Labs: ordered. Radiology: ordered.  Risk Prescription drug management.     Fransico Meadow, MD 10/19/22 [phone]

## 2022-10-19 ENCOUNTER — Other Ambulatory Visit: Payer: Self-pay | Admitting: Internal Medicine

## 2022-10-25 ENCOUNTER — Other Ambulatory Visit: Payer: Self-pay | Admitting: Orthopaedic Surgery

## 2022-10-25 DIAGNOSIS — M79672 Pain in left foot: Secondary | ICD-10-CM | POA: Diagnosis not present

## 2022-10-29 ENCOUNTER — Ambulatory Visit
Admission: RE | Admit: 2022-10-29 | Discharge: 2022-10-29 | Disposition: A | Discharge status: Home / Self Care | Payer: Medicare Other | Source: Ambulatory Visit | Attending: Orthopaedic Surgery | Admitting: Orthopaedic Surgery

## 2022-10-29 DIAGNOSIS — M7989 Other specified soft tissue disorders: Secondary | ICD-10-CM | POA: Diagnosis not present

## 2022-10-29 DIAGNOSIS — M79672 Pain in left foot: Secondary | ICD-10-CM

## 2022-11-03 DIAGNOSIS — M79672 Pain in left foot: Secondary | ICD-10-CM | POA: Diagnosis not present

## 2022-11-03 DIAGNOSIS — S92322A Displaced fracture of second metatarsal bone, left foot, initial encounter for closed fracture: Secondary | ICD-10-CM | POA: Diagnosis not present

## 2022-11-03 DIAGNOSIS — S92332A Displaced fracture of third metatarsal bone, left foot, initial encounter for closed fracture: Secondary | ICD-10-CM | POA: Diagnosis not present

## 2022-11-03 DIAGNOSIS — S92515A Nondisplaced fracture of proximal phalanx of left lesser toe(s), initial encounter for closed fracture: Secondary | ICD-10-CM | POA: Diagnosis not present

## 2022-11-17 DIAGNOSIS — M79672 Pain in left foot: Secondary | ICD-10-CM | POA: Diagnosis not present

## 2022-11-23 ENCOUNTER — Ambulatory Visit (INDEPENDENT_AMBULATORY_CARE_PROVIDER_SITE_OTHER): Payer: Medicare Other

## 2022-11-23 DIAGNOSIS — I471 Supraventricular tachycardia, unspecified: Secondary | ICD-10-CM

## 2022-11-23 LAB — CUP PACEART REMOTE DEVICE CHECK
Battery Remaining Longevity: 40 mo
Battery Voltage: 2.92 V
Brady Statistic AP VP Percent: 0 %
Brady Statistic AP VS Percent: 0 %
Brady Statistic AS VP Percent: 99.95 %
Brady Statistic AS VS Percent: 0.05 %
Brady Statistic RA Percent Paced: 0 %
Brady Statistic RV Percent Paced: 99.95 %
Date Time Interrogation Session: 20231212123602
Implantable Lead Connection Status: 753985
Implantable Lead Connection Status: 753985
Implantable Lead Implant Date: 20180905
Implantable Lead Implant Date: 20180905
Implantable Lead Location: 753859
Implantable Lead Location: 753860
Implantable Lead Model: 3830
Implantable Lead Model: 5076
Implantable Pulse Generator Implant Date: 20180905
Lead Channel Impedance Value: 285 Ohm
Lead Channel Impedance Value: 342 Ohm
Lead Channel Impedance Value: 380 Ohm
Lead Channel Impedance Value: 551 Ohm
Lead Channel Pacing Threshold Amplitude: 1.75 V
Lead Channel Pacing Threshold Pulse Width: 0.4 ms
Lead Channel Sensing Intrinsic Amplitude: 1.5 mV
Lead Channel Sensing Intrinsic Amplitude: 1.5 mV
Lead Channel Sensing Intrinsic Amplitude: 2.875 mV
Lead Channel Sensing Intrinsic Amplitude: 3.125 mV
Lead Channel Setting Pacing Amplitude: 2.5 V
Lead Channel Setting Pacing Pulse Width: 1 ms
Lead Channel Setting Sensing Sensitivity: 0.9 mV
Zone Setting Status: 755011

## 2022-11-26 ENCOUNTER — Encounter: Payer: Medicare Other | Admitting: Internal Medicine

## 2022-12-16 ENCOUNTER — Other Ambulatory Visit: Payer: Self-pay | Admitting: Internal Medicine

## 2022-12-22 NOTE — Progress Notes (Signed)
Remote pacemaker transmission.   

## 2023-02-09 ENCOUNTER — Encounter: Payer: Self-pay | Admitting: Internal Medicine

## 2023-02-09 ENCOUNTER — Ambulatory Visit: Payer: Medicare Other | Attending: Internal Medicine | Admitting: Internal Medicine

## 2023-02-09 VITALS — BP 120/86 | HR 72 | Ht 69.0 in | Wt 162.6 lb

## 2023-02-09 DIAGNOSIS — Z950 Presence of cardiac pacemaker: Secondary | ICD-10-CM

## 2023-02-09 DIAGNOSIS — I480 Paroxysmal atrial fibrillation: Secondary | ICD-10-CM

## 2023-02-09 DIAGNOSIS — I493 Ventricular premature depolarization: Secondary | ICD-10-CM | POA: Diagnosis not present
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
## 2023-02-09 NOTE — Patient Instructions (Signed)
Medication Instructions:  Your physician recommends that you continue on your current medications as directed. Please refer to the Current Medication list given to you today.  *If you need a refill on your cardiac medications before your next appointment, please call your pharmacy*  Lab Work: None ordered.  If you have labs (blood work) drawn today and your tests are completely normal, you will receive your results only by: [place] (if you have MyChart) OR A paper copy in the mail If you have any lab test that is abnormal or we need to change your treatment, we will call you to review the results.  Testing/Procedures: None ordered.  Follow-Up: At [facility], you and your health needs are our priority.  As part of our continuing mission to provide you with exceptional heart care, we have created designated Provider Care Teams.  These Care Teams include your primary Cardiologist (physician) and Advanced Practice Providers (APPs -  Physician Assistants and Nurse Practitioners) who all work together to provide you with the care you need, when you need it.  We recommend signing up for the patient portal called "MyChart".  Sign up information is provided on this After Visit Summary.  MyChart is used to connect with patients for Virtual Visits (Telemedicine).  Patients are able to view lab/test results, encounter notes, upcoming appointments, etc.  Non-urgent messages can be sent to your provider as well.   To learn more about what you can do with MyChart, go to [email or web address].    Your next appointment:   1 year(s)  The format for your next appointment:   In Person  Provider:   [name], MD{or one of the following Advanced Practice Providers on your designated Care Team:   Tommye Standard, [place] Legrand Como "Jonni Sanger" Chalmers Cater, [place]  Remote monitoring is used to monitor your Pacemaker from home. This monitoring reduces the number of office visits required to check your device to  one time per year. It allows [place] to keep an eye on the functioning of your device to ensure it is working properly. You are scheduled for a device check from home on 02/22/2023. You may send your transmission at any time that day. If you have a wireless device, the transmission will be sent automatically. After your physician reviews your transmission, you will receive a postcard with your next transmission date.

## 2023-02-22 ENCOUNTER — Ambulatory Visit (INDEPENDENT_AMBULATORY_CARE_PROVIDER_SITE_OTHER): Payer: Medicare Other

## 2023-02-22 DIAGNOSIS — I471 Supraventricular tachycardia, unspecified: Secondary | ICD-10-CM | POA: Diagnosis not present

## 2023-02-24 LAB — CUP PACEART REMOTE DEVICE CHECK
Battery Remaining Longevity: 36 mo
Battery Voltage: 2.91 V
Brady Statistic AP VP Percent: 0 %
Brady Statistic AP VS Percent: 0 %
Brady Statistic AS VP Percent: 99.88 %
Brady Statistic AS VS Percent: 0.12 %
Brady Statistic RA Percent Paced: 0 %
Brady Statistic RV Percent Paced: 99.88 %
Date Time Interrogation Session: 20240311205238
Implantable Lead Connection Status: 753985
Implantable Lead Connection Status: 753985
Implantable Lead Implant Date: 20180905
Implantable Lead Implant Date: 20180905
Implantable Lead Location: 753859
Implantable Lead Location: 753860
Implantable Lead Model: 3830
Implantable Lead Model: 5076
Implantable Pulse Generator Implant Date: 20180905
Lead Channel Impedance Value: 266 Ohm
Lead Channel Impedance Value: 323 Ohm
Lead Channel Impedance Value: 361 Ohm
Lead Channel Impedance Value: 532 Ohm
Lead Channel Pacing Threshold Amplitude: 0.5 V
Lead Channel Pacing Threshold Pulse Width: 0.4 ms
Lead Channel Sensing Intrinsic Amplitude: 1.5 mV
Lead Channel Sensing Intrinsic Amplitude: 2.875 mV
Lead Channel Sensing Intrinsic Amplitude: 3.125 mV
Lead Channel Sensing Intrinsic Amplitude: 3.125 mV
Lead Channel Setting Pacing Amplitude: 2.5 V
Lead Channel Setting Pacing Pulse Width: 1 ms
Lead Channel Setting Sensing Sensitivity: 0.9 mV
Zone Setting Status: 755011

## 2023-03-14 DIAGNOSIS — R531 Weakness: Secondary | ICD-10-CM | POA: Diagnosis not present

## 2023-03-14 DIAGNOSIS — M25512 Pain in left shoulder: Secondary | ICD-10-CM | POA: Diagnosis not present

## 2023-03-14 DIAGNOSIS — S46011A Strain of muscle(s) and tendon(s) of the rotator cuff of right shoulder, initial encounter: Secondary | ICD-10-CM | POA: Diagnosis not present

## 2023-03-15 DIAGNOSIS — D649 Anemia, unspecified: Secondary | ICD-10-CM | POA: Diagnosis not present

## 2023-03-17 DIAGNOSIS — M25512 Pain in left shoulder: Secondary | ICD-10-CM | POA: Diagnosis not present

## 2023-03-17 DIAGNOSIS — R531 Weakness: Secondary | ICD-10-CM | POA: Diagnosis not present

## 2023-03-17 DIAGNOSIS — S46011A Strain of muscle(s) and tendon(s) of the rotator cuff of right shoulder, initial encounter: Secondary | ICD-10-CM | POA: Diagnosis not present

## 2023-03-21 DIAGNOSIS — S46011A Strain of muscle(s) and tendon(s) of the rotator cuff of right shoulder, initial encounter: Secondary | ICD-10-CM | POA: Diagnosis not present

## 2023-03-21 DIAGNOSIS — M25512 Pain in left shoulder: Secondary | ICD-10-CM | POA: Diagnosis not present

## 2023-03-21 DIAGNOSIS — R531 Weakness: Secondary | ICD-10-CM | POA: Diagnosis not present

## 2023-03-23 DIAGNOSIS — M25512 Pain in left shoulder: Secondary | ICD-10-CM | POA: Diagnosis not present

## 2023-03-23 DIAGNOSIS — R531 Weakness: Secondary | ICD-10-CM | POA: Diagnosis not present

## 2023-03-23 DIAGNOSIS — S46011A Strain of muscle(s) and tendon(s) of the rotator cuff of right shoulder, initial encounter: Secondary | ICD-10-CM | POA: Diagnosis not present

## 2023-03-28 ENCOUNTER — Other Ambulatory Visit: Payer: Self-pay | Admitting: Internal Medicine

## 2023-03-28 DIAGNOSIS — S46011A Strain of muscle(s) and tendon(s) of the rotator cuff of right shoulder, initial encounter: Secondary | ICD-10-CM | POA: Diagnosis not present

## 2023-03-28 DIAGNOSIS — M25512 Pain in left shoulder: Secondary | ICD-10-CM | POA: Diagnosis not present

## 2023-03-28 DIAGNOSIS — R531 Weakness: Secondary | ICD-10-CM | POA: Diagnosis not present

## 2023-03-31 DIAGNOSIS — M25512 Pain in left shoulder: Secondary | ICD-10-CM | POA: Diagnosis not present

## 2023-03-31 DIAGNOSIS — R531 Weakness: Secondary | ICD-10-CM | POA: Diagnosis not present

## 2023-03-31 DIAGNOSIS — S46011A Strain of muscle(s) and tendon(s) of the rotator cuff of right shoulder, initial encounter: Secondary | ICD-10-CM | POA: Diagnosis not present

## 2023-04-04 DIAGNOSIS — M25512 Pain in left shoulder: Secondary | ICD-10-CM | POA: Diagnosis not present

## 2023-04-04 DIAGNOSIS — S46011A Strain of muscle(s) and tendon(s) of the rotator cuff of right shoulder, initial encounter: Secondary | ICD-10-CM | POA: Diagnosis not present

## 2023-04-04 DIAGNOSIS — R531 Weakness: Secondary | ICD-10-CM | POA: Diagnosis not present

## 2023-04-05 NOTE — Progress Notes (Signed)
Remote pacemaker transmission.   

## 2023-04-07 DIAGNOSIS — M25512 Pain in left shoulder: Secondary | ICD-10-CM | POA: Diagnosis not present

## 2023-04-07 DIAGNOSIS — R531 Weakness: Secondary | ICD-10-CM | POA: Diagnosis not present

## 2023-04-07 DIAGNOSIS — S46011A Strain of muscle(s) and tendon(s) of the rotator cuff of right shoulder, initial encounter: Secondary | ICD-10-CM | POA: Diagnosis not present

## 2023-04-13 DIAGNOSIS — M25512 Pain in left shoulder: Secondary | ICD-10-CM | POA: Diagnosis not present

## 2023-04-19 DIAGNOSIS — M25512 Pain in left shoulder: Secondary | ICD-10-CM | POA: Diagnosis not present

## 2023-04-19 DIAGNOSIS — R531 Weakness: Secondary | ICD-10-CM | POA: Diagnosis not present

## 2023-04-19 DIAGNOSIS — S46011A Strain of muscle(s) and tendon(s) of the rotator cuff of right shoulder, initial encounter: Secondary | ICD-10-CM | POA: Diagnosis not present

## 2023-04-21 DIAGNOSIS — R531 Weakness: Secondary | ICD-10-CM | POA: Diagnosis not present

## 2023-04-21 DIAGNOSIS — M25512 Pain in left shoulder: Secondary | ICD-10-CM | POA: Diagnosis not present

## 2023-04-21 DIAGNOSIS — S46011A Strain of muscle(s) and tendon(s) of the rotator cuff of right shoulder, initial encounter: Secondary | ICD-10-CM | POA: Diagnosis not present

## 2023-04-22 DIAGNOSIS — D509 Iron deficiency anemia, unspecified: Secondary | ICD-10-CM | POA: Diagnosis not present

## 2023-04-22 DIAGNOSIS — E039 Hypothyroidism, unspecified: Secondary | ICD-10-CM | POA: Diagnosis not present

## 2023-04-26 DIAGNOSIS — M25512 Pain in left shoulder: Secondary | ICD-10-CM | POA: Diagnosis not present

## 2023-04-26 DIAGNOSIS — I130 Hypertensive heart and chronic kidney disease with heart failure and stage 1 through stage 4 chronic kidney disease, or unspecified chronic kidney disease: Secondary | ICD-10-CM | POA: Diagnosis not present

## 2023-04-26 DIAGNOSIS — R531 Weakness: Secondary | ICD-10-CM | POA: Diagnosis not present

## 2023-04-26 DIAGNOSIS — E039 Hypothyroidism, unspecified: Secondary | ICD-10-CM | POA: Diagnosis not present

## 2023-04-26 DIAGNOSIS — N1831 Chronic kidney disease, stage 3a: Secondary | ICD-10-CM | POA: Diagnosis not present

## 2023-04-26 DIAGNOSIS — I700 Atherosclerosis of aorta: Secondary | ICD-10-CM | POA: Diagnosis not present

## 2023-04-26 DIAGNOSIS — Z9989 Dependence on other enabling machines and devices: Secondary | ICD-10-CM | POA: Diagnosis not present

## 2023-04-26 DIAGNOSIS — S46011A Strain of muscle(s) and tendon(s) of the rotator cuff of right shoulder, initial encounter: Secondary | ICD-10-CM | POA: Diagnosis not present

## 2023-04-26 DIAGNOSIS — I5032 Chronic diastolic (congestive) heart failure: Secondary | ICD-10-CM | POA: Diagnosis not present

## 2023-04-28 ENCOUNTER — Other Ambulatory Visit: Payer: Self-pay | Admitting: Internal Medicine

## 2023-04-28 DIAGNOSIS — I480 Paroxysmal atrial fibrillation: Secondary | ICD-10-CM

## 2023-04-28 DIAGNOSIS — M25512 Pain in left shoulder: Secondary | ICD-10-CM | POA: Diagnosis not present

## 2023-04-28 DIAGNOSIS — S46011A Strain of muscle(s) and tendon(s) of the rotator cuff of right shoulder, initial encounter: Secondary | ICD-10-CM | POA: Diagnosis not present

## 2023-04-28 DIAGNOSIS — R531 Weakness: Secondary | ICD-10-CM | POA: Diagnosis not present

## 2023-04-28 NOTE — Telephone Encounter (Signed)
Prescription refill request for Eliquis received. Indication: Aflutter Last office visit: 02/09/23 Ladona Ridgel)  Scr: 0.94 (10/18/22)  Age: 89 Weight: 73.8kg  Appropriate dose. Refill sent.

## 2023-05-17 ENCOUNTER — Other Ambulatory Visit: Payer: Self-pay | Admitting: Internal Medicine

## 2023-05-24 ENCOUNTER — Ambulatory Visit: Payer: Medicare Other

## 2023-05-24 DIAGNOSIS — I059 Rheumatic mitral valve disease, unspecified: Secondary | ICD-10-CM | POA: Diagnosis not present

## 2023-05-25 LAB — CUP PACEART REMOTE DEVICE CHECK
Battery Remaining Longevity: 25 mo
Battery Voltage: 2.9 V
Brady Statistic AP VP Percent: 0 %
Brady Statistic AP VS Percent: 0 %
Brady Statistic AS VP Percent: 99.92 %
Brady Statistic AS VS Percent: 0.08 %
Brady Statistic RA Percent Paced: 0 %
Brady Statistic RV Percent Paced: 99.92 %
Date Time Interrogation Session: 20240610212752
Implantable Lead Connection Status: 753985
Implantable Lead Connection Status: 753985
Implantable Lead Implant Date: 20180905
Implantable Lead Implant Date: 20180905
Implantable Lead Location: 753859
Implantable Lead Location: 753860
Implantable Lead Model: 3830
Implantable Lead Model: 5076
Implantable Pulse Generator Implant Date: 20180905
Lead Channel Impedance Value: 266 Ohm
Lead Channel Impedance Value: 323 Ohm
Lead Channel Impedance Value: 399 Ohm
Lead Channel Impedance Value: 532 Ohm
Lead Channel Pacing Threshold Amplitude: 0.625 V
Lead Channel Pacing Threshold Pulse Width: 0.4 ms
Lead Channel Sensing Intrinsic Amplitude: 1.5 mV
Lead Channel Sensing Intrinsic Amplitude: 2.875 mV
Lead Channel Sensing Intrinsic Amplitude: 3.125 mV
Lead Channel Sensing Intrinsic Amplitude: 3.125 mV
Lead Channel Setting Pacing Amplitude: 2.5 V
Lead Channel Setting Pacing Pulse Width: 1 ms
Lead Channel Setting Sensing Sensitivity: 0.9 mV
Zone Setting Status: 755011

## 2023-06-08 ENCOUNTER — Ambulatory Visit: Payer: Medicare Other | Admitting: Adult Health

## 2023-06-08 DIAGNOSIS — N3000 Acute cystitis without hematuria: Secondary | ICD-10-CM | POA: Diagnosis not present

## 2023-06-08 DIAGNOSIS — M19211 Secondary osteoarthritis, right shoulder: Secondary | ICD-10-CM | POA: Diagnosis not present

## 2023-06-08 DIAGNOSIS — R8271 Bacteriuria: Secondary | ICD-10-CM | POA: Diagnosis not present

## 2023-06-08 DIAGNOSIS — R350 Frequency of micturition: Secondary | ICD-10-CM | POA: Diagnosis not present

## 2023-06-08 DIAGNOSIS — R310 Gross hematuria: Secondary | ICD-10-CM | POA: Diagnosis not present

## 2023-06-21 ENCOUNTER — Encounter: Payer: Self-pay | Admitting: Adult Health

## 2023-06-21 ENCOUNTER — Other Ambulatory Visit: Payer: Self-pay

## 2023-06-21 ENCOUNTER — Inpatient Hospital Stay: Payer: Medicare Other | Attending: Adult Health | Admitting: Adult Health

## 2023-06-21 VITALS — BP 141/93 | HR 77 | Temp 97.3°F | Resp 18 | Wt 160.3 lb

## 2023-06-21 DIAGNOSIS — C50512 Malignant neoplasm of lower-outer quadrant of left female breast: Secondary | ICD-10-CM | POA: Diagnosis not present

## 2023-06-21 DIAGNOSIS — Z170 Estrogen receptor positive status [ER+]: Secondary | ICD-10-CM | POA: Diagnosis not present

## 2023-06-21 NOTE — Progress Notes (Signed)
[facility] Cancer Follow up:    Tracy Laughter, MD (Inactive) [address] [place] [address]   DIAGNOSIS:  Cancer Staging  Breast cancer of lower-outer quadrant of left female breast [facility]) Staging form: Breast, AJCC 7th Edition - Pathologic stage from 02/14/2015: Stage IA (T1c, N0, cM0) - Unsigned - Clinical: Stage IA (T1c, N0, M0) - Unsigned   SUMMARY OF ONCOLOGIC HISTORY: Oncology History  Breast cancer of lower-outer quadrant of left female breast (HCC)  01/15/2015 Initial Biopsy   Left breast biopsy: Invasive lobular cancer, grade 2, ER+ (94%), PR+ (29%),  HER-2 negative, Ki-67 19%.   01/21/2015 Breast MRI   Left breast:: 1.7 x 1.6 x 1.4 cm irregular enhancing mass lower outer quadrant, no abnormal lymph nodes   01/21/2015 Clinical Stage   Stage IA: TI cN0 M0.   02/14/2015 Surgery   Left breast lumpectomy ([facility]): Invasive lobular cancer, negative for LV I, tumor size 01.7 cm, margins negative, HER-2 was repeated and it was negative   02/14/2015 Pathologic Stage   Stage IA: T1c N0   02/26/2015 Miscellaneous   Pt opted for no RT.   03/12/2015 Procedure   Genetic testing: OvaNext gene panel  ([facility]) revealed VUS at ATM. Otherwise negative BARD1, BRCA1, BRCA2, BRIP1, CDH1, CHEK2, EPCAM, MLH1, MRE11A, MSH2, MSH6, MUTYH, NBN, NF1, PALB2, PMS2, PTEN, RAD50, RAD51C, RAD51D, SMARCA4, STK11, TP53.   04/29/2015 - 02/09/2016 Anti-estrogen oral therapy   Anastrozole 1 mg daily changed to letrozole 2.5 mg daily 09/19/2015 due to dry mouth and loss of taste, stopped due to continuation of same side effects   08/22/2015 Survivorship   A copy of the survivorship care plan was mailed to the patient in lieu of an in-person visit at her request.   10/21/2021 Relapse/Recurrence   Screening mammogram detected left breast abnormality, biopsy revealed grade 2 invasive lobular cancer ER 100%, PR less than 1%, HER2 equivocal FISH pending, Ki-67 10%   11/18/2021  Surgery   11/18/21: Breast conserving surgery: Grade 2 ILC 1.4 cm,  ER 100%, PR less than 1%, HER2 equivocal FISH Neg, Ki-67 10%     CURRENT THERAPY: observation  INTERVAL HISTORY: [name] 89 y.o. female returns for follow-up of her recurrent left breast invasive lobular carcinoma.  She opted to forego taking any antiestrogen therapy and continues on observation alone.  She has not undergone mammography in the past 2 years.  She is unsure whether she wants to undergo any further mammograms.  She denies any significant health changes since her last visit with [place].  She continues to see her primary care provider regularly along with cardiology as she has a pacemaker.   Patient Active Problem List   Diagnosis Date Noted   Chronic diastolic heart failure (HCC) 03/27/2021   PVC's (premature ventricular contractions) 09/23/2020   Pacemaker 01/10/2020   Pain in left knee 09/27/2018   Acute diastolic CHF (congestive heart failure) (HCC) 08/11/2017   Acute CHF (congestive heart failure) (HCC) 08/11/2017   Acute CHF (HCC) 08/11/2017   Persistent atrial fibrillation (HCC)    ILD (interstitial lung disease) (HCC) 02/21/2017   CHF (congestive heart failure) (HCC) 02/21/2017   Dyspnea and respiratory abnormality 02/11/2017   Atrial flutter (HCC) 02/04/2017   Chronic atrial fibrillation (HCC) 01/19/2017   Abnormality of gait and mobility 01/19/2017   Hypothyroidism 01/19/2017   Other specified disorders of bone density and structure, unspecified site 01/19/2017   Genetic testing 03/13/2015   Paresthesia of skin  03/03/2015   Breast cancer of lower-outer quadrant of left female breast (HCC) 02/06/2015   Hematuria    PAF (paroxysmal atrial fibrillation) (HCC)    Chronic venous insufficiency    SVT (supraventricular tachycardia) 06/12/2010   Essential hypertension 04/22/2010   MITRAL VALVE PROLAPSE 04/22/2010   Atypical atrial flutter (HCC) 04/22/2010   SUPRAVENTRICULAR TACHYCARDIA  04/22/2010    is allergic to adhesive [tape], codeine, latex, and percodan [oxycodone-aspirin].  MEDICAL HISTORY: Past Medical History:  Diagnosis Date   Allergic rhinitis    Arthritis    bil knees   Breast cancer, left breast (HCC) 01/15/2015   Invasive Mammary   Chronic venous insufficiency    Hematuria    negative workup - Dr. Wanda Plump   Hypertension    Hypothyroidism    Migraine headache    MVP (mitral valve prolapse)    OAB (overactive bladder)    Pacemaker    PAF (paroxysmal atrial fibrillation) (HCC)    SVT (supraventricular tachycardia) 06/2010   s/p AVNRT ablation   Wears glasses     SURGICAL HISTORY: Past Surgical History:  Procedure Laterality Date   A-FLUTTER ABLATION N/A 02/04/2017   Procedure: A-Flutter Ablation;  Surgeon: Marinus Maw, MD;  [facility]: [facility];  Service: Cardiovascular;  Laterality: N/A;   ABDOMINAL HYSTERECTOMY     AV NODE ABLATION N/A 08/17/2017   Procedure: AV Node Ablation;  Surgeon: Marinus Maw, MD;  [facility];  Service: Cardiovascular;  Laterality: N/A;   BREAST LUMPECTOMY WITH RADIOACTIVE SEED LOCALIZATION Left 02/14/2015   Procedure: BREAST LUMPECTOMY WITH RADIOACTIVE SEED LOCALIZATION;  Surgeon: Ovidio Kin, MD;  Location: [facility];  Service: General;  Laterality: Left;   BREAST LUMPECTOMY WITH RADIOACTIVE SEED LOCALIZATION Left 11/18/2021   Procedure: LEFT BREAST LUMPECTOMY WITH RADIOACTIVE SEED LOCALIZATION;  Surgeon: Harriette Bouillon, MD;  Location: [facility];  Service: General;  Laterality: Left;   BREAST SURGERY  1990   rt br bx   CARDIAC CATHETERIZATION  2011   ablasion   CARDIOVERSION N/A 08/10/2017   Procedure: CARDIOVERSION;  Surgeon: Jake Bathe, MD;  Location: [facility];  Service: Cardiovascular;  Laterality: N/A;   COLONOSCOPY     DILATION AND CURETTAGE OF UTERUS     Left Breast Biopsy  01/15/15   PACEMAKER IMPLANT N/A 08/17/2017   Procedure: Pacemaker  Implant;  Surgeon: Marinus Maw, MD;  Location: [facility];  Service: Cardiovascular;  Laterality: N/A;   TUBAL LIGATION      SOCIAL HISTORY: Social History   Socioeconomic History   Marital status: Married    Spouse name: Not on file   Number of children: 3   Years of education: Not on file   Highest education level: Not on file  Occupational History   Not on file  Tobacco Use   Smoking status: Never   Smokeless tobacco: Never   Tobacco comments:    did smoke about 2cigs long time about 54 plus years ago  Vaping Use   Vaping Use: Never used  Substance and Sexual Activity   Alcohol use: Yes    Comment: glass of wine nightly   Drug use: No   Sexual activity: Not Currently    Birth control/protection: Surgical  Other Topics Concern   Not on file  Social History Narrative   LIves with husband.    Retired.    Social Determinants of Health   Financial Resource Strain: Not on file  Food Insecurity: Not on file  Transportation Needs: Not on file  Physical Activity: Not on file  Stress: Not on file  Social Connections: Not on file  Intimate Partner Violence: Not on file    FAMILY HISTORY: Family History  Problem Relation Age of Onset   Hypertension Mother    Heart disease Mother    Arthritis Mother    Other Father        hardening of the arteries   Heart Problems Sister        pacemaker   Heart attack Brother    Throat cancer Paternal Uncle    Breast cancer Sister        early 50s   Alzheimer's disease Sister    Pulmonary disease Sister    Breast cancer Daughter 55       Negative genetic testing    Breast cancer Paternal Aunt        dx over 76   Cancer Paternal Aunt        cancer in her leg   Breast cancer Cousin        multiple paternal cousin    Review of Systems  Constitutional:  Negative for appetite change, chills, fatigue, fever and unexpected weight change.  HENT:   Negative for hearing loss, lump/mass and trouble swallowing.   Eyes:   Negative for eye problems and icterus.  Respiratory:  Negative for chest tightness, cough and shortness of breath.   Cardiovascular:  Negative for chest pain, leg swelling and palpitations.  Gastrointestinal:  Negative for abdominal distention, abdominal pain, constipation, diarrhea, nausea and vomiting.  Endocrine: Negative for hot flashes.  Genitourinary:  Negative for difficulty urinating.   Musculoskeletal:  Negative for arthralgias.  Skin:  Negative for itching and rash.  Neurological:  Negative for dizziness, extremity weakness, headaches and numbness.  Hematological:  Negative for adenopathy. Does not bruise/bleed easily.  Psychiatric/Behavioral:  Negative for depression. The patient is not nervous/anxious.       PHYSICAL EXAMINATION    Vitals:   06/21/23 1153  BP: (!) 141/93  Pulse: 77  Resp: 18  Temp: (!) 97.3 F (36.3 C)  SpO2: 95%    Physical Exam Constitutional:      General: She is not in acute distress.    Appearance: Normal appearance. She is not toxic-appearing.     Comments: Examined in chair  HENT:     Head: Normocephalic and atraumatic.     Mouth/Throat:     Mouth: Mucous membranes are moist.     Pharynx: Oropharynx is clear. No oropharyngeal exudate or posterior oropharyngeal erythema.  Eyes:     General: No scleral icterus. Cardiovascular:     Rate and Rhythm: Normal rate and regular rhythm.     Pulses: Normal pulses.     Heart sounds: Normal heart sounds.  Pulmonary:     Effort: Pulmonary effort is normal.     Breath sounds: Normal breath sounds.  Chest:     Comments: Left breast status postlumpectomy, no sign of local recurrence right breast is benign.  Bilateral axillae benign. Abdominal:     General: Abdomen is flat. Bowel sounds are normal.     Palpations: Abdomen is soft.     Comments: Examination limited due to examining her in the chair.  Musculoskeletal:        General: No swelling.     Cervical back: Neck supple.   Lymphadenopathy:     Cervical: No cervical adenopathy.  Skin:  General: Skin is warm and dry.     Findings: No rash.  Neurological:     General: No focal deficit present.     Mental Status: She is alert.  Psychiatric:        Mood and Affect: Mood normal.        Behavior: Behavior normal.       ASSESSMENT and THERAPY PLAN:   Breast cancer of lower-outer quadrant of left female breast [name] is here today for follow-up of her history of recurrent left-sided estrogen receptor positive invasive lobular carcinoma that was diagnosed in 2022.  She has opted to forego antiestrogen therapy receiving surgery alone.  Stage Ia left-sided recurrent breast cancer: She has no clinical signs of breast cancer recurrence.  She continues on observation with clinical breast exam only.  Thus far she has opted to forego annual mammograms.  I did recommend this again today since it can detect things much earlier than her breast exam can.  She is going to think about this. Health maintenance: I did recommend that she continue to follow-up with her primary care provider and we discussed briefly healthy diet and activity recommendations for her.  [name] will return in 1 year for continued long-term follow-up for her history of breast cancer.   All questions were answered. The patient knows to call the clinic with any problems, questions or concerns. We can certainly see the patient much sooner if necessary.  Total encounter time:20 minutes*in face-to-face visit time, chart review, lab review, care coordination, order entry, and documentation of the encounter time.    Lillard Anes, NP 06/21/23 12:18 PM Medical Oncology and Hematology [facility] [address] [place], [place] [address] Tel. [phone]    Fax. [phone]  *Total Encounter Time as defined by the Centers for Medicare and Medicaid Services includes, in addition to the face-to-face time of a patient visit (documented in  the note above) non-face-to-face time: obtaining and reviewing outside history, ordering and reviewing medications, tests or procedures, care coordination (communications with other health care professionals or caregivers) and documentation in the medical record.

## 2023-06-21 NOTE — Assessment & Plan Note (Signed)
[name] is here today for follow-up of her history of recurrent left-sided estrogen receptor positive invasive lobular carcinoma that was diagnosed in 2022.  She has opted to forego antiestrogen therapy receiving surgery alone.  Stage Ia left-sided recurrent breast cancer: She has no clinical signs of breast cancer recurrence.  She continues on observation with clinical breast exam only.  Thus far she has opted to forego annual mammograms.  I did recommend this again today since it can detect things much earlier than her breast exam can.  She is going to think about this. Health maintenance: I did recommend that she continue to follow-up with her primary care provider and we discussed briefly healthy diet and activity recommendations for her.  [name] will return in 1 year for continued long-term follow-up for her history of breast cancer.

## 2023-06-22 NOTE — Progress Notes (Signed)
Remote pacemaker transmission.   

## 2023-06-26 ENCOUNTER — Other Ambulatory Visit: Payer: Self-pay | Admitting: Internal Medicine

## 2023-07-11 DIAGNOSIS — M19211 Secondary osteoarthritis, right shoulder: Secondary | ICD-10-CM | POA: Diagnosis not present

## 2023-08-23 ENCOUNTER — Ambulatory Visit (INDEPENDENT_AMBULATORY_CARE_PROVIDER_SITE_OTHER): Payer: Medicare Other

## 2023-08-23 DIAGNOSIS — I4821 Permanent atrial fibrillation: Secondary | ICD-10-CM

## 2023-08-23 LAB — CUP PACEART REMOTE DEVICE CHECK
Battery Remaining Longevity: 25 mo
Battery Voltage: 2.9 V
Brady Statistic AP VP Percent: 0 %
Brady Statistic AP VS Percent: 0 %
Brady Statistic AS VP Percent: 99.97 %
Brady Statistic AS VS Percent: 0.03 %
Brady Statistic RA Percent Paced: 0 %
Brady Statistic RV Percent Paced: 99.97 %
Date Time Interrogation Session: 20240910042512
Implantable Lead Connection Status: 753985
Implantable Lead Connection Status: 753985
Implantable Lead Implant Date: 20180905
Implantable Lead Implant Date: 20180905
Implantable Lead Location: 753859
Implantable Lead Location: 753860
Implantable Lead Model: 3830
Implantable Lead Model: 5076
Implantable Pulse Generator Implant Date: 20180905
Lead Channel Impedance Value: 266 Ohm
Lead Channel Impedance Value: 323 Ohm
Lead Channel Impedance Value: 418 Ohm
Lead Channel Impedance Value: 532 Ohm
Lead Channel Pacing Threshold Amplitude: 1.375 V
Lead Channel Pacing Threshold Pulse Width: 0.4 ms
Lead Channel Sensing Intrinsic Amplitude: 1.5 mV
Lead Channel Sensing Intrinsic Amplitude: 2.875 mV
Lead Channel Sensing Intrinsic Amplitude: 3.125 mV
Lead Channel Sensing Intrinsic Amplitude: 3.125 mV
Lead Channel Setting Pacing Amplitude: 2.5 V
Lead Channel Setting Pacing Pulse Width: 1 ms
Lead Channel Setting Sensing Sensitivity: 0.9 mV
Zone Setting Status: 755011

## 2023-09-09 NOTE — Progress Notes (Signed)
Remote pacemaker transmission.   

## 2023-09-15 DIAGNOSIS — R300 Dysuria: Secondary | ICD-10-CM | POA: Diagnosis not present

## 2023-09-15 DIAGNOSIS — Z950 Presence of cardiac pacemaker: Secondary | ICD-10-CM | POA: Diagnosis not present

## 2023-09-15 DIAGNOSIS — I700 Atherosclerosis of aorta: Secondary | ICD-10-CM | POA: Diagnosis not present

## 2023-09-15 DIAGNOSIS — R7303 Prediabetes: Secondary | ICD-10-CM | POA: Diagnosis not present

## 2023-09-15 DIAGNOSIS — I10 Essential (primary) hypertension: Secondary | ICD-10-CM | POA: Diagnosis not present

## 2023-09-15 DIAGNOSIS — D509 Iron deficiency anemia, unspecified: Secondary | ICD-10-CM | POA: Diagnosis not present

## 2023-09-15 DIAGNOSIS — Z23 Encounter for immunization: Secondary | ICD-10-CM | POA: Diagnosis not present

## 2023-09-15 DIAGNOSIS — J849 Interstitial pulmonary disease, unspecified: Secondary | ICD-10-CM | POA: Diagnosis not present

## 2023-09-15 DIAGNOSIS — I5030 Unspecified diastolic (congestive) heart failure: Secondary | ICD-10-CM | POA: Diagnosis not present

## 2023-09-15 DIAGNOSIS — E039 Hypothyroidism, unspecified: Secondary | ICD-10-CM | POA: Diagnosis not present

## 2023-09-15 DIAGNOSIS — N1831 Chronic kidney disease, stage 3a: Secondary | ICD-10-CM | POA: Diagnosis not present

## 2023-09-15 DIAGNOSIS — R5382 Chronic fatigue, unspecified: Secondary | ICD-10-CM | POA: Diagnosis not present

## 2023-09-21 DIAGNOSIS — C50912 Malignant neoplasm of unspecified site of left female breast: Secondary | ICD-10-CM | POA: Diagnosis not present

## 2023-09-29 ENCOUNTER — Other Ambulatory Visit (HOSPITAL_COMMUNITY)
Admission: RE | Admit: 2023-09-29 | Discharge: 2023-09-29 | Disposition: A | Discharge status: Home / Self Care | Payer: Medicare Other | Source: Other Acute Inpatient Hospital | Attending: Obstetrics | Admitting: Obstetrics

## 2023-09-29 ENCOUNTER — Ambulatory Visit: Payer: Medicare Other | Admitting: Obstetrics

## 2023-09-29 ENCOUNTER — Encounter: Payer: Self-pay | Admitting: Obstetrics

## 2023-09-29 VITALS — BP 138/85 | HR 77 | Wt 159.0 lb

## 2023-09-29 DIAGNOSIS — R8290 Unspecified abnormal findings in urine: Secondary | ICD-10-CM

## 2023-09-29 DIAGNOSIS — N3946 Mixed incontinence: Secondary | ICD-10-CM | POA: Diagnosis not present

## 2023-09-29 DIAGNOSIS — R351 Nocturia: Secondary | ICD-10-CM | POA: Diagnosis not present

## 2023-09-29 DIAGNOSIS — N952 Postmenopausal atrophic vaginitis: Secondary | ICD-10-CM

## 2023-09-29 LAB — POCT URINALYSIS DIP (CLINITEK)
Glucose, UA: NEGATIVE mg/dL
Ketones, POC UA: NEGATIVE mg/dL
Nitrite, UA: NEGATIVE
Spec Grav, UA: >=1.03 — AB (ref 1.010–1.025)
Urobilinogen, UA: 0.2 U/dL
pH, UA: 6 (ref 5.0–8.0)

## 2023-09-29 LAB — URINALYSIS, ROUTINE W REFLEX MICROSCOPIC
Bacteria, UA: NONE SEEN
Bilirubin Urine: NEGATIVE
Glucose, UA: NEGATIVE mg/dL
Ketones, ur: NEGATIVE mg/dL
Leukocytes,Ua: NEGATIVE
Nitrite: NEGATIVE
Protein, ur: NEGATIVE mg/dL
Specific Gravity, Urine: 1.016 (ref 1.005–1.030)
pH: 5 (ref 5.0–8.0)

## 2023-09-29 MED ORDER — GEMTESA 75 MG PO TABS: 75.0000 mg | ORAL_TABLET | Freq: Every day | ORAL | Status: DC

## 2023-09-29 MED ORDER — GEMTESA 75 MG PO TABS: 75.0000 mg | ORAL_TABLET | Freq: Every day | ORAL | 2 refills | Status: DC

## 2023-09-29 NOTE — Assessment & Plan Note (Signed)
-   clean catch POCT UA with + heme - catheterized UA to r/o microscopic hematuria - denies gross hematuria, pelvic radiation, or history of GU cancers - remote history of tobacco use

## 2023-09-29 NOTE — Assessment & Plan Note (Signed)
-   avoid vaginal estrogen due to recurrent ER+ breast cancer - reviewed vulvovaginal moisturizer Options: - Vitamin E oil (pump or capsule) or cream (Gene's Vit E Cream) - vaginal moisturizers

## 2023-09-29 NOTE — Patient Instructions (Addendum)
We discussed the symptoms of overactive bladder (OAB), which include urinary urgency, urinary frequency, night-time urination, with or without urge incontinence.  We discussed management including behavioral therapy (decreasing bladder irritants by following a bladder diet, urge suppression strategies, timed voids, bladder retraining), physical therapy, medication; and for refractory cases posterior tibial nerve stimulation, sacral neuromodulation, and intravesical botulinum toxin injection.   For Beta-3 agonist medication, we discussed the potential side effect of elevated blood pressure which is more likely to occur in individuals with uncontrolled hypertension. You were given samples for Gemtesa 75mg .  It can take a month to start working so give it time, but if you have bothersome side effects call sooner and we can try a different medication.  Call us if you have trouble filling the prescription or if it's not covered by your insurance.  Vulvovaginal moisturizer Options: Vitamin E oil (pump or capsule) or cream (Gene's Vit E Cream) Coconut oil Silicone-based lubricant for use during intercourse ("wet platinum" is a brand available at most drugstores) Crisco Consider the ingredients of the product - the fewer the ingredients the better!  Directions for Use: Clean and dry your hands Gently dab the vulvar/vaginal area dry as needed Apply a "pea-sized" amount of the moisturizer onto your fingertip Using you other hand, open the labia  Apply the moisturizer to the vulvar/vaginal tissues Wear loose fitting underwear/clothing if possible following application Use moisturize up to 3 times daily as desired.   We will send a referral for sleep study.

## 2023-09-29 NOTE — Assessment & Plan Note (Addendum)
-   We discussed the symptoms of overactive bladder (OAB), which include urinary urgency, urinary frequency, nocturia, with or without urge incontinence.  While we do not know the exact etiology of OAB, several treatment options exist. We discussed management including behavioral therapy (decreasing bladder irritants, urge suppression strategies, timed voids, bladder retraining), physical therapy, medication; for refractory cases posterior tibial nerve stimulation, sacral neuromodulation, and intravesical botulinum toxin injection.  For anticholinergic medications, we discussed the potential side effects of anticholinergics including dry eyes, dry mouth, constipation, cognitive impairment and urinary retention. For Beta-3 agonist medication, we discussed the potential side effect of elevated blood pressure which is more likely to occur in individuals with uncontrolled hypertension. - mirabegron with relief however cost prohibitive. Switch to gemtesa with samples provided and Rx sent to assess cost - postvoid residual WNL

## 2023-09-29 NOTE — Progress Notes (Signed)
New Patient Evaluation and Consultation  Referring Provider: No ref. provider found PCP: Thana Ates, MD Date of Service: 09/29/2023  SUBJECTIVE Chief Complaint: New Patient (Initial Visit) (Incontinence - wears pads all day, leaks upon getting out of bed, gets up several times a night, has not seen any change with Myrbetriq - maybe at first saw some help but now no. )  History of Present Illness: [name] is a 89 y.o. White or Caucasian female seen in consultation at the request of self for evaluation of mixed urinary incontinence.    Prior patient at [facility] managed by Dr. Randall An, prior cystoscopy with "few flakes" in 12/10/21. Last seen 06/08/23 Prior Rx Trospium 20mg  in 2021 with dry mouth and eyes. Tried Ditropan XL 10mg  switched to Vesicare due to dry mouth. Now on mirabegron 50mg  with relief but cost prohibitive ($115 for 3 month supply) Avoids fluid intake 2-3 hrs before bedtime Cefadroxil 500mg  for 7 days on 09/19/23 Stage IIIa CKD with Cr 0.9 in 2023 Recurrent left breast ER/PR positive, HER2 negative invasive lobular carcinoma s/p lumpectomy at [facility] in 2023  Review of records significant for: H/o SVT, MVP, A. Fib s/p pacemaker, chronic diastolic HF with h/o acute pulmonary edema on Eliquis   Urinary Symptoms: Leaks urine with cough/ sneeze, laughing, exercise, lifting, going from sitting to standing, and with movement to the bathroom Most often with postural changes 10x/day, night time urgency leakage 1-4x/night with larger volume leakage. Most bothered by night time symptoms Husband reports intermittent snoring  Leaks 11-15 time(s) per days.  Pad use: 2 pads per day due to smaller volume  Patient is bothered by UI symptoms.  Day time voids 10.  Nocturia: 1-5 times per night to void with compression hose for ankle edema Voiding dysfunction:  empties bladder well.  Patient does not use a catheter to empty bladder.  When urinating, patient feels she has  no difficulties Drinks: <64oz per day  UTIs: 2 UTI's in the last year.   Denies history of blood in urine, kidney or bladder stones, pyelonephritis, bladder cancer, and kidney cancer  Pelvic Organ Prolapse Symptoms:                  Patient Denies a feeling of a bulge the vaginal area.   Bowel Symptom: Bowel movements: 1 time(s) per day Stool consistency: soft  Straining: no.  Splinting: no.  Incomplete evacuation: no.  Patient Denies accidental bowel leakage / fecal incontinence Bowel regimen: diet Last colonoscopy: unable to recall   Sexual Function Sexually active: no.  Sexual orientation: Straight Pain with sex: has discomfort due to dryness  Pelvic Pain Denies pelvic pain   Past Medical History:  Past Medical History:  Diagnosis Date   Allergic rhinitis    Arthritis    bil knees   Breast cancer, left breast (HCC) 01/15/2015   Invasive Mammary   Chronic venous insufficiency    Hematuria    negative workup - Dr. Wanda Plump   Hypertension    Hypothyroidism    Migraine headache    MVP (mitral valve prolapse)    OAB (overactive bladder)    Pacemaker    PAF (paroxysmal atrial fibrillation) (HCC)    SVT (supraventricular tachycardia) (HCC) 06/2010   s/p AVNRT ablation   Wears glasses      Past Surgical History:   Past Surgical History:  Procedure Laterality Date   A-FLUTTER ABLATION N/A 02/04/2017   Procedure: A-Flutter Ablation;  Surgeon: Marinus Maw, MD;  Location: [facility];  Service: Cardiovascular;  Laterality: N/A;   ABDOMINAL HYSTERECTOMY     AV NODE ABLATION N/A 08/17/2017   Procedure: AV Node Ablation;  Surgeon: Marinus Maw, MD;  Location: [facility] INVASIVE CV LAB;  Service: Cardiovascular;  Laterality: N/A;   BREAST LUMPECTOMY WITH RADIOACTIVE SEED LOCALIZATION Left 02/14/2015   Procedure: BREAST LUMPECTOMY WITH RADIOACTIVE SEED LOCALIZATION;  Surgeon: Ovidio Kin, MD;  Location: [facility];  Service: General;  Laterality:  Left;   BREAST LUMPECTOMY WITH RADIOACTIVE SEED LOCALIZATION Left 11/18/2021   Procedure: LEFT BREAST LUMPECTOMY WITH RADIOACTIVE SEED LOCALIZATION;  Surgeon: Harriette Bouillon, MD;  Location: [facility];  Service: General;  Laterality: Left;   BREAST SURGERY  1990   rt br bx   CARDIAC CATHETERIZATION  2011   ablasion   CARDIOVERSION N/A 08/10/2017   Procedure: CARDIOVERSION;  Surgeon: Jake Bathe, MD;  Location: [facility];  Service: Cardiovascular;  Laterality: N/A;   COLONOSCOPY     DILATION AND CURETTAGE OF UTERUS     Left Breast Biopsy  01/15/15   PACEMAKER IMPLANT N/A 08/17/2017   Procedure: Pacemaker Implant;  Surgeon: Marinus Maw, MD;  Location: [facility];  Service: Cardiovascular;  Laterality: N/A;   TUBAL LIGATION       Past OB/GYN History: OB History  Gravida Para Term Preterm AB Living  4 3          SAB IAB Ectopic Multiple Live Births               # Outcome Date GA Lbr Len/2nd Weight Sex Type Anes PTL Lv  4 Gravida           3 Para           2 Para           1 Para             Obstetric Comments  All babies born around 7lbs    Vaginal deliveries: 3,  Forceps/ Vacuum deliveries: 0, Cesarean section: 0 Menopausal: s/p hysterectomy with USO 2009 for ovarian cyst Contraception: tubal ligation. Last pap smear unable to recall.  Any history of abnormal pap smears: no, denies history of LEEP or CKC  Medications: Patient has a current medication list which includes the following prescription(s): amiodarone, polyethyl glycol-propyl glycol, eliquis, levothyroxine, losartan, metoprolol succinate, triamcinolone cream, gemtesa, gemtesa, and vitamin d (ergocalciferol).   Allergies: Patient is allergic to adhesive [tape], codeine, latex, and percodan [oxycodone-aspirin].   Social History:  Social History   Tobacco Use   Smoking status: Never   Smokeless tobacco: Never   Tobacco comments:    did smoke about 2cigs long time about 54 plus years  ago  Vaping Use   Vaping status: Never Used  Substance Use Topics   Alcohol use: Yes    Comment: glass of wine nightly   Drug use: No    Relationship status: married Patient lives with her husband.   Patient is not employed. Regular exercise: No History of abuse: No  Family History:   Family History  Problem Relation Age of Onset   Hypertension Mother    Heart disease Mother    Arthritis Mother    Other Father        hardening of the arteries   Heart Problems Sister        pacemaker   Heart attack Brother    Throat cancer Paternal Uncle    Breast  cancer Sister        early 41s   Alzheimer's disease Sister    Pulmonary disease Sister    Breast cancer Daughter 9       Negative genetic testing    Breast cancer Paternal Aunt        dx over 33   Cancer Paternal Aunt        cancer in her leg   Breast cancer Cousin        multiple paternal cousin     Review of Systems: Review of Systems  Constitutional:  Negative for fever, malaise/fatigue and weight loss.  Respiratory:  Negative for cough, shortness of breath and wheezing.   Cardiovascular:  Negative for chest pain, palpitations and leg swelling.  Gastrointestinal:  Negative for abdominal pain and blood in stool.  Genitourinary:  Positive for frequency and urgency. Negative for dysuria, flank pain and hematuria.  Skin:  Negative for rash.  Neurological:  Negative for dizziness, weakness and headaches.  Endo/Heme/Allergies:  Does not bruise/bleed easily.  Psychiatric/Behavioral:  Negative for depression. The patient is not nervous/anxious.      OBJECTIVE Physical Exam: Vitals:   09/29/23 1020  BP: 138/85  Pulse: 77  SpO2: 96%  Weight: 159 lb (72.1 kg)    Physical Exam Constitutional:      General: She is not in acute distress.    Appearance: Normal appearance.  Genitourinary:     Bladder and urethral meatus normal.     No lesions in the vagina.     Right Labia: skin changes.     Right Labia: No rash,  tenderness, lesions or Bartholin's cyst.    Left Labia: skin changes.     Left Labia: No tenderness, lesions, Bartholin's cyst or rash.       No vaginal discharge, erythema, tenderness, bleeding, ulceration or granulation tissue.     No vaginal prolapse present.    Severe vaginal atrophy present.     Right Adnexa: not tender, not full and no mass present.    Left Adnexa: not tender, not full and no mass present.    Cervix is absent.     Uterus is absent.     Urethral meatus caruncle not present.    No urethral prolapse, tenderness, mass, hypermobility, discharge or stress urinary incontinence with cough stress test present.     Bladder is not tender, urgency on palpation not present and masses not present.      Pelvic Floor: Levator muscle strength is 3/5.    Levator ani not tender, obturator internus not tender, no asymmetrical contractions present and no pelvic spasms present.    Symmetrical pelvic sensation, anal wink present and BC reflex present. Cardiovascular:     Rate and Rhythm: Normal rate.  Pulmonary:     Effort: Pulmonary effort is normal. No respiratory distress.  Abdominal:     General: Abdomen is flat. There is no distension.     Palpations: Abdomen is soft. There is no mass.     Tenderness: There is no abdominal tenderness.     Hernia: No hernia is present.    Neurological:     Mental Status: She is alert.  Vitals reviewed. Exam conducted with a chaperone present.    POP-Q:   POP-Q  -3  Aa   -3                                           Ba  -5                                              C   1                                            Gh  2                                            Pb  5                                            tvl   -2                                            Ap  -2                                            Bp                                                 D        Post-Void Residual (PVR) by Bladder Scan: In order to evaluate bladder emptying, we discussed obtaining a postvoid residual and patient agreed to this procedure.  Procedure: The ultrasound unit was placed on the patient's abdomen in the suprapubic region after the patient had voided.    Post Void Residual - 09/29/23 1032       Post Void Residual   Post Void Residual 4 mL            Straight Catheterization Procedure for PVR: After verbal consent was obtained from the patient for catheterization to assess bladder emptying and residual volume the urethra and surrounding tissues were prepped with betadine and an in and out catheterization was performed.  PVR was 50mL.  Urine appeared clear yellow. The patient tolerated the procedure well.   Laboratory Results: Lab Results  Component Value Date   COLORU yellow 09/29/2023   CLARITYU clear 09/29/2023   GLUCOSEUR negative 09/29/2023   BILIRUBINUR NEGATIVE 09/29/2023   SPECGRAV >=1.030 (A) 09/29/2023   RBCUR small (A) 09/29/2023   PHUR 6.0 09/29/2023   PROTEINUR NEGATIVE 09/29/2023   UROBILINOGEN 0.2 09/29/2023   LEUKOCYTESUR NEGATIVE 09/29/2023    Lab Results  Component Value Date   CREATININE 0.90 10/18/2022   CREATININE 0.94 10/18/2022  CREATININE 1.04 (H) 11/12/2021    Lab Results  Component Value Date   HGBA1C 5.8 11/28/2015    Lab Results  Component Value Date   HGB 12.2 10/18/2022     ASSESSMENT AND PLAN Ms. Winger is a 89 y.o. with:  1. Mixed stress and urge urinary incontinence   2. Nocturia   3. Abnormal finding on urinalysis   4. Vaginal atrophy     Mixed stress and urge urinary incontinence Assessment & Plan: - We discussed the symptoms of overactive bladder (OAB), which include urinary urgency, urinary frequency, nocturia, with or without urge incontinence.  While we do not know the exact etiology of OAB, several treatment options exist. We discussed management including behavioral  therapy (decreasing bladder irritants, urge suppression strategies, timed voids, bladder retraining), physical therapy, medication; for refractory cases posterior tibial nerve stimulation, sacral neuromodulation, and intravesical botulinum toxin injection.  For anticholinergic medications, we discussed the potential side effects of anticholinergics including dry eyes, dry mouth, constipation, cognitive impairment and urinary retention. For Beta-3 agonist medication, we discussed the potential side effect of elevated blood pressure which is more likely to occur in individuals with uncontrolled hypertension. - mirabegron with relief however cost prohibitive. Switch to gemtesa with samples provided and Rx sent to assess cost  Orders: -     POCT URINALYSIS DIP (CLINITEK) -     Gemtesa; Take 1 tablet (75 mg total) by mouth daily.  Dispense: 30 tablet; Refill: 2 -     Gemtesa; Take 1 tablet (75 mg total) by mouth daily.  Nocturia Assessment & Plan: For night time frequency: - avoid fluid intake after 6pm - elevated your feet during the day and continue to use compression socks to reduce lower extremity swelling - trial of gemtesa to assess symptoms - discussed possible workup for sleep apnea with referral for study study sent   Orders: -     Gemtesa; Take 1 tablet (75 mg total) by mouth daily.  Dispense: 30 tablet; Refill: 2 -     Gemtesa; Take 1 tablet (75 mg total) by mouth daily. -     Ambulatory referral to Sleep Studies  Abnormal finding on urinalysis Assessment & Plan: - clean catch POCT UA with + heme - catheterized UA to r/o microscopic hematuria - denies gross hematuria, pelvic radiation, or history of GU cancers - remote history of tobacco use   Orders: -     Urinalysis, Routine w reflex microscopic; Future -     Urine Culture; Future  Vaginal atrophy Assessment & Plan: - avoid vaginal estrogen due to recurrent ER+ breast cancer - reviewed vulvovaginal moisturizer  Options: - Vitamin E oil (pump or capsule) or cream (Gene's Vit E Cream) - vaginal moisturizers     Time spent: I spent 93 minutes dedicated to the care of this patient with her husband on the date of this encounter to include pre-visit review of records, face-to-face time with the patient discussing mixed urinary incontinence, nocturia, vaginal atrophy in the setting of recurrent breast CA and post visit documentation and ordering medication/ testing.    Loleta Chance, MD

## 2023-09-29 NOTE — Assessment & Plan Note (Signed)
For night time frequency: - avoid fluid intake after 6pm - elevated your feet during the day and continue to use compression socks to reduce lower extremity swelling - trial of gemtesa to assess symptoms - discussed possible workup for sleep apnea with referral for study study sent

## 2023-09-30 LAB — URINE CULTURE: Culture: NO GROWTH

## 2023-09-30 NOTE — Progress Notes (Signed)
Patient has been notified

## 2023-10-03 DIAGNOSIS — N1831 Chronic kidney disease, stage 3a: Secondary | ICD-10-CM | POA: Diagnosis not present

## 2023-10-04 ENCOUNTER — Other Ambulatory Visit: Payer: Self-pay | Admitting: Pharmacist

## 2023-10-04 DIAGNOSIS — I480 Paroxysmal atrial fibrillation: Secondary | ICD-10-CM

## 2023-10-04 MED ORDER — APIXABAN 5 MG PO TABS: 5.0000 mg | ORAL_TABLET | Freq: Two times a day (BID) | ORAL | 1 refills | Status: DC

## 2023-10-05 DIAGNOSIS — N63 Unspecified lump in unspecified breast: Secondary | ICD-10-CM | POA: Diagnosis not present

## 2023-10-06 DIAGNOSIS — C50912 Malignant neoplasm of unspecified site of left female breast: Secondary | ICD-10-CM | POA: Diagnosis not present

## 2023-10-07 ENCOUNTER — Ambulatory Visit (INDEPENDENT_AMBULATORY_CARE_PROVIDER_SITE_OTHER): Payer: Medicare Other

## 2023-10-07 ENCOUNTER — Other Ambulatory Visit (HOSPITAL_COMMUNITY)
Admission: RE | Admit: 2023-10-07 | Discharge: 2023-10-07 | Disposition: A | Discharge status: Home / Self Care | Payer: Medicare Other | Source: Other Acute Inpatient Hospital | Attending: Obstetrics and Gynecology | Admitting: Obstetrics and Gynecology

## 2023-10-07 DIAGNOSIS — R350 Frequency of micturition: Secondary | ICD-10-CM

## 2023-10-07 DIAGNOSIS — R82998 Other abnormal findings in urine: Secondary | ICD-10-CM

## 2023-10-07 DIAGNOSIS — N390 Urinary tract infection, site not specified: Secondary | ICD-10-CM

## 2023-10-07 LAB — POCT URINALYSIS DIPSTICK
Glucose, UA: NEGATIVE
Nitrite, UA: NEGATIVE
Protein, UA: POSITIVE — AB
Spec Grav, UA: >=1.03 — AB (ref 1.010–1.025)
Urobilinogen, UA: 0.2 U/dL
pH, UA: 6 (ref 5.0–8.0)

## 2023-10-07 MED ORDER — CIPROFLOXACIN HCL 500 MG PO TABS: 500.0000 mg | ORAL_TABLET | Freq: Two times a day (BID) | ORAL | 0 refills | Status: AC

## 2023-10-07 MED ORDER — PHENAZOPYRIDINE HCL 95 MG PO TABS: 95.0000 mg | ORAL_TABLET | Freq: Three times a day (TID) | ORAL | 1 refills | Status: DC | PRN

## 2023-10-07 NOTE — Progress Notes (Signed)
[name] is a 89 y.o. female  arrived today with UTI sx.  Per Dr. Jari Favre protocol: A urine specimen was collected and POCT Urine was done and urine culture sent to the lab. POCT Urine was Positive  Pt was notified and prescription sent to the preferred pharmacy.

## 2023-10-07 NOTE — Patient Instructions (Addendum)
Your Urine dip that was done in office was Positive. I am sending the urine off for culture and you can take AZO over the counter for your discomfort.  We have also ordered Cipro and PYRIDIUM for you to take while we wait for your culture results, hopefully this gives you some relief. We will contact you when the results are back between 3-5 days. If a different antibiotic is needed we will sent the order to the pharmacy and you will be notified. If you have any questions or concerns please feel free to call us at [phone]

## 2023-10-07 NOTE — Addendum Note (Signed)
Addended by: Thressa Sheller on: 10/07/2023 03:49 PM   Modules accepted: Orders

## 2023-10-09 LAB — URINE CULTURE: Culture: >=100000 — AB

## 2023-10-17 ENCOUNTER — Telehealth: Payer: Self-pay | Admitting: Internal Medicine

## 2023-10-17 NOTE — Telephone Encounter (Signed)
*  STAT* If patient is at the pharmacy, call can be transferred to refill team.   1. Which medications need to be refilled? (please list name of each medication and dose if known)   Vitamin D, Ergocalciferol, (DRISDOL) 50000 units CAPS capsule   2. Would you like to learn more about the convenience, safety, & potential cost savings by using the [facility]?   3. Are you open to using the [facility] (Type [facility]. ).  4. Which pharmacy/location (including street and city if local pharmacy) is medication to be sent to?  [facility] [id number] - [place], [place] - [address]   5. Do they need a 30 day or 90 day supply?  30 day  Patient stated she is completely out of this medication.

## 2023-10-17 NOTE — Telephone Encounter (Signed)
Called pt to inform her that she needed to contact her PCP for a refill on her Vitamin D. I advised the pt that if she has any cardiac problems, questions or concerns, to give our office a call back. Pt verbalized understanding

## 2023-10-18 DIAGNOSIS — N6315 Unspecified lump in the right breast, overlapping quadrants: Secondary | ICD-10-CM | POA: Diagnosis not present

## 2023-10-18 DIAGNOSIS — Z853 Personal history of malignant neoplasm of breast: Secondary | ICD-10-CM | POA: Diagnosis not present

## 2023-11-03 ENCOUNTER — Ambulatory Visit: Payer: Medicare Other | Admitting: Obstetrics

## 2023-11-03 ENCOUNTER — Encounter: Payer: Self-pay | Admitting: Obstetrics

## 2023-11-03 VITALS — BP 134/81 | HR 76

## 2023-11-03 DIAGNOSIS — R351 Nocturia: Secondary | ICD-10-CM

## 2023-11-03 DIAGNOSIS — N952 Postmenopausal atrophic vaginitis: Secondary | ICD-10-CM

## 2023-11-03 DIAGNOSIS — N3946 Mixed incontinence: Secondary | ICD-10-CM | POA: Diagnosis not present

## 2023-11-03 MED ORDER — GEMTESA 75 MG PO TABS: 75.0000 mg | ORAL_TABLET | Freq: Every day | ORAL | Status: DC

## 2023-11-03 NOTE — Patient Instructions (Addendum)
We discussed the symptoms of overactive bladder (OAB), which include urinary urgency, urinary frequency, night-time urination, with or without urge incontinence.  We discussed management including behavioral therapy (decreasing bladder irritants by following a bladder diet, urge suppression strategies, timed voids, bladder retraining), physical therapy, medication; and for refractory cases posterior tibial nerve stimulation, sacral neuromodulation, and intravesical botulinum toxin injection.   For Beta-3 agonist medication, we discussed the potential side effect of elevated blood pressure which is more likely to occur in individuals with uncontrolled hypertension. You were given samples for Gemtesa 75 mg.  It can take a month to start working so give it time, but if you have bothersome side effects call sooner and we can try a different medication.  Call us if you have trouble filling the prescription or if it's not covered by your insurance.  Please call the [facility] sleep study center at [phone] to schedule a new patient appointment.

## 2023-11-03 NOTE — Assessment & Plan Note (Signed)
-   avoid vaginal estrogen due to recurrent ER+ breast cancer - reviewed vulvovaginal moisturizer Options: - Vitamin E oil (pump or capsule) or cream (Gene's Vit E Cream) - vaginal moisturizers

## 2023-11-03 NOTE — Assessment & Plan Note (Signed)
-   We previously discussed the symptoms of overactive bladder (OAB), which include urinary urgency, urinary frequency, nocturia, with or without urge incontinence.  While we do not know the exact etiology of OAB, several treatment options exist. We discussed management including behavioral therapy (decreasing bladder irritants, urge suppression strategies, timed voids, bladder retraining), physical therapy, medication; for refractory cases posterior tibial nerve stimulation, sacral neuromodulation, and intravesical botulinum toxin injection.  For anticholinergic medications, we discussed the potential side effects of anticholinergics including dry eyes, dry mouth, constipation, cognitive impairment and urinary retention. For Beta-3 agonist medication, we discussed the potential side effect of elevated blood pressure which is more likely to occur in individuals with uncontrolled hypertension. - mirabegron with relief however cost prohibitive.  - Switch to gemtesa with samples provided and Rx ($100), pt discontinued around the time of UTI. Encouraged to resume with additional samples provided, unclear quantity left at home - postvoid residual WNL at 1st visit, repeat if clinical change

## 2023-11-03 NOTE — Assessment & Plan Note (Signed)
-   most bothersome per patient due to night time leakage - avoid fluid intake after 6pm - elevated feet during the day and continue to use compression socks to reduce lower extremity swelling - encouraged to resume trial of gemtesa to assess symptoms - if symptoms refractory to OAB treatment, encouraged workup for sleep apnea with referral for study study sent. Provided number for patient to call and schedule appt

## 2023-11-03 NOTE — Progress Notes (Signed)
[facility] Return Visit  SUBJECTIVE  History of Present Illness: [name] is a 89 y.o. female seen in follow-up for mixed urinary incontinence, nocturia, vaginal atrophy. Plan at last visit was trial of Gemtesa.   Symptoms worsened after starting medications, discontinued samples around the time of antibiotics. 10/07/23 evaluated for UTI due to urinary frequency and started on Cipro with resolution of symptoms. Urine culture > 100K Proteus mirabilis resistant to Macrobid.  Reports getting call for sleep study, however did not proceed with appt after discussion with friends. Changed her mind and call back without return call.   Started bladder diary and voids every 3 hours  Most often with postural changes 10x/day, night time urgency leakage 1-4x/night with larger volume leakage. Most bothered by night time symptoms  Leaks 11-15 time(s) per days.  Pad use: 2 pads per day due to smaller volume   Past Medical History: Patient  has a past medical history of Allergic rhinitis, Arthritis, Breast cancer, left breast (HCC) (01/15/2015), Chronic venous insufficiency, Hematuria, Hypertension, Hypothyroidism, Migraine headache, MVP (mitral valve prolapse), OAB (overactive bladder), Pacemaker, PAF (paroxysmal atrial fibrillation) (HCC), SVT (supraventricular tachycardia) (HCC) (06/2010), and Wears glasses.   Past Surgical History: She  has a past surgical history that includes Left Breast Biopsy (01/15/15); Breast surgery (1990); Abdominal hysterectomy; Tubal ligation; Dilation and curettage of uterus; Colonoscopy; Cardiac catheterization (2011); Breast lumpectomy with radioactive seed localization (Left, 02/14/2015); A-FLUTTER ABLATION (N/A, 02/04/2017); Cardioversion (N/A, 08/10/2017); PACEMAKER IMPLANT (N/A, 08/17/2017); AV NODE ABLATION (N/A, 08/17/2017); and Breast lumpectomy with radioactive seed localization (Left, 11/18/2021).   Medications: She has a current medication list which  includes the following prescription(s): amiodarone, apixaban, polyethyl glycol-propyl glycol, levothyroxine, losartan, metoprolol succinate, phenazopyridine, triamcinolone cream, gemtesa, vitamin d (ergocalciferol), and gemtesa.   Allergies: Patient is allergic to adhesive [tape], codeine, latex, and percodan [oxycodone-aspirin].   Social History: Patient  reports that she has never smoked. She has never used smokeless tobacco. She reports current alcohol use. She reports that she does not use drugs.      OBJECTIVE     Physical Exam: Vitals:   11/03/23 1022  BP: 134/81  Pulse: 76   Gen: No apparent distress, A&O x 3.  Detailed Urogynecologic Evaluation:  Deferred.       ASSESSMENT AND PLAN    [name] is a 89 y.o. with:  1. Mixed stress and urge urinary incontinence   2. Nocturia   3. Vaginal atrophy     Mixed stress and urge urinary incontinence Assessment & Plan: - We previously discussed the symptoms of overactive bladder (OAB), which include urinary urgency, urinary frequency, nocturia, with or without urge incontinence.  While we do not know the exact etiology of OAB, several treatment options exist. We discussed management including behavioral therapy (decreasing bladder irritants, urge suppression strategies, timed voids, bladder retraining), physical therapy, medication; for refractory cases posterior tibial nerve stimulation, sacral neuromodulation, and intravesical botulinum toxin injection.  For anticholinergic medications, we discussed the potential side effects of anticholinergics including dry eyes, dry mouth, constipation, cognitive impairment and urinary retention. For Beta-3 agonist medication, we discussed the potential side effect of elevated blood pressure which is more likely to occur in individuals with uncontrolled hypertension. - mirabegron with relief however cost prohibitive.  - Switch to gemtesa with samples provided and Rx ($100), pt discontinued  around the time of UTI. Encouraged to resume with additional samples provided, unclear quantity left at home - postvoid residual WNL at 1st visit, repeat if  clinical change  Orders: Leslye Peer; Take 1 tablet (75 mg total) by mouth daily.  Nocturia Assessment & Plan: - most bothersome per patient due to night time leakage - avoid fluid intake after 6pm - elevated feet during the day and continue to use compression socks to reduce lower extremity swelling - encouraged to resume trial of gemtesa to assess symptoms - if symptoms refractory to OAB treatment, encouraged workup for sleep apnea with referral for study study sent. Provided number for patient to call and schedule appt    Vaginal atrophy Assessment & Plan: - avoid vaginal estrogen due to recurrent ER+ breast cancer - reviewed vulvovaginal moisturizer Options: - Vitamin E oil (pump or capsule) or cream (Gene's Vit E Cream) - vaginal moisturizers    Time spent: I spent 44 minutes dedicated to the care of this patient on the date of this encounter to include pre-visit review of records, face-to-face time with the patient discussing mixed urinary incontinence, nocturia, vaginal atrophy, and post visit documentation and ordering medication.   Loleta Chance, MD

## 2023-11-14 DIAGNOSIS — L98499 Non-pressure chronic ulcer of skin of other sites with unspecified severity: Secondary | ICD-10-CM | POA: Diagnosis not present

## 2023-11-14 DIAGNOSIS — L950 Livedoid vasculitis: Secondary | ICD-10-CM | POA: Diagnosis not present

## 2023-11-14 DIAGNOSIS — I872 Venous insufficiency (chronic) (peripheral): Secondary | ICD-10-CM | POA: Diagnosis not present

## 2023-11-14 DIAGNOSIS — L97311 Non-pressure chronic ulcer of right ankle limited to breakdown of skin: Secondary | ICD-10-CM | POA: Diagnosis not present

## 2023-11-14 DIAGNOSIS — L308 Other specified dermatitis: Secondary | ICD-10-CM | POA: Diagnosis not present

## 2023-11-14 DIAGNOSIS — Z85828 Personal history of other malignant neoplasm of skin: Secondary | ICD-10-CM | POA: Diagnosis not present

## 2023-11-22 ENCOUNTER — Ambulatory Visit (INDEPENDENT_AMBULATORY_CARE_PROVIDER_SITE_OTHER): Payer: Medicare Other

## 2023-11-22 DIAGNOSIS — I4821 Permanent atrial fibrillation: Secondary | ICD-10-CM

## 2023-11-23 LAB — CUP PACEART REMOTE DEVICE CHECK
Battery Remaining Longevity: 22 mo
Battery Voltage: 2.88 V
Brady Statistic AP VP Percent: 0 %
Brady Statistic AP VS Percent: 0 %
Brady Statistic AS VP Percent: 99.97 %
Brady Statistic AS VS Percent: 0.03 %
Brady Statistic RA Percent Paced: 0 %
Brady Statistic RV Percent Paced: 99.97 %
Date Time Interrogation Session: 20241209231314
Implantable Lead Connection Status: 753985
Implantable Lead Connection Status: 753985
Implantable Lead Implant Date: 20180905
Implantable Lead Implant Date: 20180905
Implantable Lead Location: 753859
Implantable Lead Location: 753860
Implantable Lead Model: 3830
Implantable Lead Model: 5076
Implantable Pulse Generator Implant Date: 20180905
Lead Channel Impedance Value: 266 Ohm
Lead Channel Impedance Value: 323 Ohm
Lead Channel Impedance Value: 399 Ohm
Lead Channel Impedance Value: 532 Ohm
Lead Channel Pacing Threshold Amplitude: 0.75 V
Lead Channel Pacing Threshold Pulse Width: 0.4 ms
Lead Channel Sensing Intrinsic Amplitude: 1.5 mV
Lead Channel Sensing Intrinsic Amplitude: 2.875 mV
Lead Channel Sensing Intrinsic Amplitude: 3.125 mV
Lead Channel Sensing Intrinsic Amplitude: 3.125 mV
Lead Channel Setting Pacing Amplitude: 2.5 V
Lead Channel Setting Pacing Pulse Width: 1 ms
Lead Channel Setting Sensing Sensitivity: 0.9 mV
Zone Setting Status: 755011

## 2023-12-09 ENCOUNTER — Inpatient Hospital Stay (HOSPITAL_COMMUNITY): Payer: Medicare Other

## 2023-12-09 ENCOUNTER — Inpatient Hospital Stay (HOSPITAL_COMMUNITY)
Admission: EM | Admit: 2023-12-09 | Discharge: 2023-12-12 | DRG: 196 | Disposition: A | Discharge status: Home / Self Care | Payer: Medicare Other | Attending: Internal Medicine | Admitting: Internal Medicine

## 2023-12-09 ENCOUNTER — Emergency Department (HOSPITAL_COMMUNITY): Payer: Medicare Other

## 2023-12-09 ENCOUNTER — Encounter (HOSPITAL_COMMUNITY): Payer: Self-pay

## 2023-12-09 ENCOUNTER — Other Ambulatory Visit: Payer: Self-pay

## 2023-12-09 DIAGNOSIS — I5042 Chronic combined systolic (congestive) and diastolic (congestive) heart failure: Secondary | ICD-10-CM | POA: Diagnosis present

## 2023-12-09 DIAGNOSIS — Z950 Presence of cardiac pacemaker: Secondary | ICD-10-CM

## 2023-12-09 DIAGNOSIS — R062 Wheezing: Secondary | ICD-10-CM | POA: Diagnosis not present

## 2023-12-09 DIAGNOSIS — Z8249 Family history of ischemic heart disease and other diseases of the circulatory system: Secondary | ICD-10-CM

## 2023-12-09 DIAGNOSIS — I480 Paroxysmal atrial fibrillation: Secondary | ICD-10-CM | POA: Diagnosis not present

## 2023-12-09 DIAGNOSIS — J479 Bronchiectasis, uncomplicated: Secondary | ICD-10-CM | POA: Diagnosis not present

## 2023-12-09 DIAGNOSIS — Z808 Family history of malignant neoplasm of other organs or systems: Secondary | ICD-10-CM

## 2023-12-09 DIAGNOSIS — Z79890 Hormone replacement therapy (postmenopausal): Secondary | ICD-10-CM

## 2023-12-09 DIAGNOSIS — J9601 Acute respiratory failure with hypoxia: Secondary | ICD-10-CM | POA: Diagnosis present

## 2023-12-09 DIAGNOSIS — Z91048 Other nonmedicinal substance allergy status: Secondary | ICD-10-CM

## 2023-12-09 DIAGNOSIS — I499 Cardiac arrhythmia, unspecified: Secondary | ICD-10-CM | POA: Diagnosis not present

## 2023-12-09 DIAGNOSIS — Z91040 Latex allergy status: Secondary | ICD-10-CM

## 2023-12-09 DIAGNOSIS — M40204 Unspecified kyphosis, thoracic region: Secondary | ICD-10-CM | POA: Diagnosis not present

## 2023-12-09 DIAGNOSIS — R0902 Hypoxemia: Secondary | ICD-10-CM

## 2023-12-09 DIAGNOSIS — J8489 Other specified interstitial pulmonary diseases: Principal | ICD-10-CM | POA: Diagnosis present

## 2023-12-09 DIAGNOSIS — Z8261 Family history of arthritis: Secondary | ICD-10-CM

## 2023-12-09 DIAGNOSIS — R339 Retention of urine, unspecified: Secondary | ICD-10-CM | POA: Diagnosis not present

## 2023-12-09 DIAGNOSIS — Z79899 Other long term (current) drug therapy: Secondary | ICD-10-CM

## 2023-12-09 DIAGNOSIS — I110 Hypertensive heart disease with heart failure: Secondary | ICD-10-CM | POA: Diagnosis not present

## 2023-12-09 DIAGNOSIS — R6889 Other general symptoms and signs: Secondary | ICD-10-CM | POA: Diagnosis not present

## 2023-12-09 DIAGNOSIS — Z853 Personal history of malignant neoplasm of breast: Secondary | ICD-10-CM

## 2023-12-09 DIAGNOSIS — Z86718 Personal history of other venous thrombosis and embolism: Secondary | ICD-10-CM

## 2023-12-09 DIAGNOSIS — E8809 Other disorders of plasma-protein metabolism, not elsewhere classified: Secondary | ICD-10-CM | POA: Diagnosis not present

## 2023-12-09 DIAGNOSIS — R0602 Shortness of breath: Secondary | ICD-10-CM | POA: Diagnosis not present

## 2023-12-09 DIAGNOSIS — D696 Thrombocytopenia, unspecified: Secondary | ICD-10-CM | POA: Diagnosis not present

## 2023-12-09 DIAGNOSIS — E872 Acidosis, unspecified: Secondary | ICD-10-CM | POA: Diagnosis not present

## 2023-12-09 DIAGNOSIS — I7121 Aneurysm of the ascending aorta, without rupture: Secondary | ICD-10-CM | POA: Diagnosis not present

## 2023-12-09 DIAGNOSIS — E039 Hypothyroidism, unspecified: Secondary | ICD-10-CM | POA: Diagnosis present

## 2023-12-09 DIAGNOSIS — K219 Gastro-esophageal reflux disease without esophagitis: Secondary | ICD-10-CM | POA: Diagnosis present

## 2023-12-09 DIAGNOSIS — Z803 Family history of malignant neoplasm of breast: Secondary | ICD-10-CM

## 2023-12-09 DIAGNOSIS — Z7901 Long term (current) use of anticoagulants: Secondary | ICD-10-CM | POA: Diagnosis not present

## 2023-12-09 DIAGNOSIS — R0689 Other abnormalities of breathing: Secondary | ICD-10-CM | POA: Diagnosis not present

## 2023-12-09 DIAGNOSIS — D649 Anemia, unspecified: Secondary | ICD-10-CM | POA: Diagnosis not present

## 2023-12-09 DIAGNOSIS — Z1152 Encounter for screening for COVID-19: Secondary | ICD-10-CM

## 2023-12-09 DIAGNOSIS — J849 Interstitial pulmonary disease, unspecified: Secondary | ICD-10-CM | POA: Diagnosis not present

## 2023-12-09 DIAGNOSIS — E876 Hypokalemia: Secondary | ICD-10-CM | POA: Diagnosis not present

## 2023-12-09 DIAGNOSIS — J90 Pleural effusion, not elsewhere classified: Secondary | ICD-10-CM | POA: Diagnosis not present

## 2023-12-09 DIAGNOSIS — J40 Bronchitis, not specified as acute or chronic: Principal | ICD-10-CM

## 2023-12-09 DIAGNOSIS — I700 Atherosclerosis of aorta: Secondary | ICD-10-CM | POA: Diagnosis present

## 2023-12-09 DIAGNOSIS — R0989 Other specified symptoms and signs involving the circulatory and respiratory systems: Secondary | ICD-10-CM | POA: Diagnosis not present

## 2023-12-09 DIAGNOSIS — R918 Other nonspecific abnormal finding of lung field: Secondary | ICD-10-CM | POA: Diagnosis not present

## 2023-12-09 DIAGNOSIS — Z743 Need for continuous supervision: Secondary | ICD-10-CM | POA: Diagnosis not present

## 2023-12-09 DIAGNOSIS — I447 Left bundle-branch block, unspecified: Secondary | ICD-10-CM | POA: Diagnosis not present

## 2023-12-09 DIAGNOSIS — Z90710 Acquired absence of both cervix and uterus: Secondary | ICD-10-CM

## 2023-12-09 DIAGNOSIS — Z7951 Long term (current) use of inhaled steroids: Secondary | ICD-10-CM

## 2023-12-09 LAB — CBC WITH DIFFERENTIAL/PLATELET
Abs Immature Granulocytes: 0.02 10*3/uL (ref 0.00–0.07)
Basophils Absolute: 0 10*3/uL (ref 0.0–0.1)
Basophils Relative: 0 %
Eosinophils Absolute: 0 10*3/uL (ref 0.0–0.5)
Eosinophils Relative: 0 %
HCT: 39.4 % (ref 36.0–46.0)
Hemoglobin: 12.8 g/dL (ref 12.0–15.0)
Immature Granulocytes: 0 %
Lymphocytes Relative: 16 %
Lymphs Abs: 1.3 10*3/uL (ref 0.7–4.0)
MCH: 30.8 pg (ref 26.0–34.0)
MCHC: 32.5 g/dL (ref 30.0–36.0)
MCV: 94.7 fL (ref 80.0–100.0)
Monocytes Absolute: 0.5 10*3/uL (ref 0.1–1.0)
Monocytes Relative: 5 %
Neutro Abs: 6.4 10*3/uL (ref 1.7–7.7)
Neutrophils Relative %: 79 %
Platelets: 159 10*3/uL (ref 150–400)
RBC: 4.16 MIL/uL (ref 3.87–5.11)
RDW: 15.1 % (ref 11.5–15.5)
WBC: 8.3 10*3/uL (ref 4.0–10.5)
nRBC: 0 % (ref 0.0–0.2)

## 2023-12-09 LAB — RESP PANEL BY RT-PCR (RSV, FLU A&B, COVID)  RVPGX2
Influenza A by PCR: NEGATIVE
Influenza B by PCR: NEGATIVE
Resp Syncytial Virus by PCR: NEGATIVE
SARS Coronavirus 2 by RT PCR: NEGATIVE

## 2023-12-09 LAB — COMPREHENSIVE METABOLIC PANEL
ALT: 13 U/L (ref 0–44)
AST: 24 U/L (ref 15–41)
Albumin: 3.8 g/dL (ref 3.5–5.0)
Alkaline Phosphatase: 92 U/L (ref 38–126)
Anion gap: 11 (ref 5–15)
BUN: 16 mg/dL (ref 8–23)
CO2: 23 mmol/L (ref 22–32)
Calcium: 8.2 mg/dL — ABNORMAL LOW (ref 8.9–10.3)
Chloride: 100 mmol/L (ref 98–111)
Creatinine, Ser: 0.66 mg/dL (ref 0.44–1.00)
GFR, Estimated: >60 mL/min (ref >60)
Glucose, Bld: 146 mg/dL — ABNORMAL HIGH (ref 70–99)
Potassium: 3.2 mmol/L — ABNORMAL LOW (ref 3.5–5.1)
Sodium: 134 mmol/L — ABNORMAL LOW (ref 135–145)
Total Bilirubin: 1.2 mg/dL — ABNORMAL HIGH (ref <1.2)
Total Protein: 7.7 g/dL (ref 6.5–8.1)

## 2023-12-09 MED ORDER — METOPROLOL SUCCINATE ER 25 MG PO TB24
25.0000 mg | ORAL_TABLET | Freq: Every day | ORAL | Status: DC
  Administered 2023-12-09 – 2023-12-11 (×3): 25 mg via ORAL
  Filled 2023-12-09 (×3): qty 1

## 2023-12-09 MED ORDER — IPRATROPIUM-ALBUTEROL 0.5-2.5 (3) MG/3ML IN SOLN
3.0000 mL | Freq: Four times a day (QID) | RESPIRATORY_TRACT | Status: DC
  Administered 2023-12-09 – 2023-12-10 (×3): 3 mL via RESPIRATORY_TRACT
  Filled 2023-12-09 (×3): qty 3

## 2023-12-09 MED ORDER — HYDROCODONE-ACETAMINOPHEN 5-325 MG PO TABS: 1.0000 | ORAL_TABLET | ORAL | Status: DC | PRN

## 2023-12-09 MED ORDER — ONDANSETRON HCL 4 MG PO TABS: 4.0000 mg | ORAL_TABLET | Freq: Four times a day (QID) | ORAL | Status: DC | PRN

## 2023-12-09 MED ORDER — ALBUTEROL SULFATE HFA 108 (90 BASE) MCG/ACT IN AERS
2.0000 | INHALATION_SPRAY | RESPIRATORY_TRACT | Status: DC | PRN
Start: 2023-12-09 — End: 2023-12-09
  Administered 2023-12-09: 2 via RESPIRATORY_TRACT
  Filled 2023-12-09: qty 6.7

## 2023-12-09 MED ORDER — AZITHROMYCIN 250 MG PO TABS
500.0000 mg | ORAL_TABLET | Freq: Every day | ORAL | Status: DC
  Administered 2023-12-09 – 2023-12-11 (×3): 500 mg via ORAL
  Filled 2023-12-09 (×3): qty 2

## 2023-12-09 MED ORDER — ACETAMINOPHEN 650 MG RE SUPP: 650.0000 mg | Freq: Four times a day (QID) | RECTAL | Status: DC | PRN

## 2023-12-09 MED ORDER — LEVOTHYROXINE SODIUM 125 MCG PO TABS
125.0000 ug | ORAL_TABLET | Freq: Every day | ORAL | Status: DC
  Administered 2023-12-10 – 2023-12-12 (×3): 125 ug via ORAL
  Filled 2023-12-09 (×3): qty 1

## 2023-12-09 MED ORDER — IPRATROPIUM BROMIDE 0.02 % IN SOLN
0.5000 mg | Freq: Once | RESPIRATORY_TRACT | Status: AC
  Administered 2023-12-09: 0.5 mg via RESPIRATORY_TRACT
  Filled 2023-12-09: qty 2.5

## 2023-12-09 MED ORDER — MAGNESIUM SULFATE 2 GM/50ML IV SOLN
2.0000 g | Freq: Once | INTRAVENOUS | Status: AC
  Administered 2023-12-09: 2 g via INTRAVENOUS
  Filled 2023-12-09: qty 50

## 2023-12-09 MED ORDER — SODIUM CHLORIDE 0.9 % IV BOLUS
500.0000 mL | Freq: Once | INTRAVENOUS | Status: AC
  Administered 2023-12-09: 500 mL via INTRAVENOUS

## 2023-12-09 MED ORDER — PREDNISONE 50 MG PO TABS
50.0000 mg | ORAL_TABLET | Freq: Every day | ORAL | Status: DC
  Administered 2023-12-10 – 2023-12-12 (×3): 50 mg via ORAL
  Filled 2023-12-09 (×3): qty 1

## 2023-12-09 MED ORDER — BUDESONIDE 0.25 MG/2ML IN SUSP
0.2500 mg | Freq: Two times a day (BID) | RESPIRATORY_TRACT | Status: DC
  Administered 2023-12-09 – 2023-12-11 (×4): 0.25 mg via RESPIRATORY_TRACT
  Filled 2023-12-09 (×5): qty 2

## 2023-12-09 MED ORDER — ALBUTEROL SULFATE (2.5 MG/3ML) 0.083% IN NEBU
5.0000 mg | INHALATION_SOLUTION | Freq: Once | RESPIRATORY_TRACT | Status: AC
  Administered 2023-12-09: 5 mg via RESPIRATORY_TRACT
  Filled 2023-12-09: qty 6

## 2023-12-09 MED ORDER — POTASSIUM CHLORIDE 20 MEQ PO PACK
40.0000 meq | PACK | Freq: Once | ORAL | Status: AC
  Administered 2023-12-09: 40 meq via ORAL
  Filled 2023-12-09: qty 2

## 2023-12-09 MED ORDER — LOSARTAN POTASSIUM 50 MG PO TABS
50.0000 mg | ORAL_TABLET | Freq: Every day | ORAL | Status: DC
  Administered 2023-12-10 – 2023-12-12 (×3): 50 mg via ORAL
  Filled 2023-12-09 (×3): qty 1

## 2023-12-09 MED ORDER — MIRABEGRON ER 25 MG PO TB24
25.0000 mg | ORAL_TABLET | Freq: Every day | ORAL | Status: DC
  Administered 2023-12-09 – 2023-12-11 (×3): 25 mg via ORAL
  Filled 2023-12-09 (×4): qty 1

## 2023-12-09 MED ORDER — ARFORMOTEROL TARTRATE 15 MCG/2ML IN NEBU
15.0000 ug | INHALATION_SOLUTION | Freq: Two times a day (BID) | RESPIRATORY_TRACT | Status: DC
  Administered 2023-12-09 – 2023-12-11 (×4): 15 ug via RESPIRATORY_TRACT
  Filled 2023-12-09 (×5): qty 2

## 2023-12-09 MED ORDER — APIXABAN 5 MG PO TABS
5.0000 mg | ORAL_TABLET | Freq: Two times a day (BID) | ORAL | Status: DC
  Administered 2023-12-09 – 2023-12-12 (×6): 5 mg via ORAL
  Filled 2023-12-09 (×6): qty 1

## 2023-12-09 MED ORDER — ONDANSETRON HCL 4 MG/2ML IJ SOLN: 4.0000 mg | Freq: Four times a day (QID) | INTRAMUSCULAR | Status: DC | PRN

## 2023-12-09 MED ORDER — AMIODARONE HCL 200 MG PO TABS
100.0000 mg | ORAL_TABLET | Freq: Every day | ORAL | Status: DC
  Administered 2023-12-09 – 2023-12-11 (×3): 100 mg via ORAL
  Filled 2023-12-09 (×4): qty 1

## 2023-12-09 MED ORDER — ACETAMINOPHEN 325 MG PO TABS: 650.0000 mg | ORAL_TABLET | Freq: Four times a day (QID) | ORAL | Status: DC | PRN

## 2023-12-09 NOTE — Plan of Care (Signed)

## 2023-12-09 NOTE — H&P (Addendum)
History and Physical    [name]:[id number] DOB: 01-15-1933 DOA: 12/09/2023  PCP: Thana Ates, MD   Chief Complaint:  sob  HPI: [name] is a 89 y.o. female with medical history significant of breast cancer, hypertension, hypothyroidism, paroxysmal A-fib who presented to the emergency department due to shortness breath and cough.  Patient been traveling the last several days and developed a nonproductive cough as well as subjective fever.  She also noticed wheezing in her lungs.  She went to an urgent care and was found to be hypoxic so she was sent to the ER for further assessment.  On arrival to the emergency department she was afebrile hemodynamically stable.  She was placed on 2 L of oxygen due to saturations around 90%.  Labs were pain which demonstrated WBC 8.3, hemoglobin 12.8, sodium 134, potassium 3.2, creatinine 0.66, respiratory viral panel negative.  Chest x-ray showed no acute findings.  Patient was given nebulizers when walk test which resulted in persistent hypoxia.  She was admitted further workup.  She denies any prior hx of lung issues. She has been on amiodarone for 10+ years.   Review of Systems: Review of Systems  Constitutional: Negative.   HENT: Negative.    Eyes: Negative.   Respiratory:  Positive for cough and shortness of breath.   Cardiovascular: Negative.   Gastrointestinal: Negative.   Genitourinary: Negative.   Musculoskeletal: Negative.   Skin: Negative.   Neurological: Negative.   Endo/Heme/Allergies: Negative.   Psychiatric/Behavioral: Negative.       As per HPI otherwise 10 point review of systems negative.   Allergies  Allergen Reactions   Adhesive [Tape] Rash and Other (See Comments)    Including EKG electrodes and the like   Codeine    Latex Itching   Oxycodone-Aspirin    Percodan [Oxycodone-Aspirin] Nausea And Vomiting    Past Medical History:  Diagnosis Date   Allergic rhinitis    Arthritis    bil knees   Breast  cancer, left breast (HCC) 01/15/2015   Invasive Mammary   Chronic venous insufficiency    Hematuria    negative workup - Dr. Wanda Plump   Hypertension    Hypothyroidism    Migraine headache    MVP (mitral valve prolapse)    OAB (overactive bladder)    Pacemaker    PAF (paroxysmal atrial fibrillation) (HCC)    SVT (supraventricular tachycardia) (HCC) 06/2010   s/p AVNRT ablation   Wears glasses     Past Surgical History:  Procedure Laterality Date   A-FLUTTER ABLATION N/A 02/04/2017   Procedure: A-Flutter Ablation;  Surgeon: Marinus Maw, MD;  Location: [facility] INVASIVE [facility];  Service: Cardiovascular;  Laterality: N/A;   ABDOMINAL HYSTERECTOMY     AV NODE ABLATION N/A 08/17/2017   Procedure: AV Node Ablation;  Surgeon: Marinus Maw, MD;  Location: [facility] INVASIVE CV LAB;  Service: Cardiovascular;  Laterality: N/A;   BREAST LUMPECTOMY WITH RADIOACTIVE SEED LOCALIZATION Left 02/14/2015   Procedure: BREAST LUMPECTOMY WITH RADIOACTIVE SEED LOCALIZATION;  Surgeon: Ovidio Kin, MD;  Location: [facility];  Service: General;  Laterality: Left;   BREAST LUMPECTOMY WITH RADIOACTIVE SEED LOCALIZATION Left 11/18/2021   Procedure: LEFT BREAST LUMPECTOMY WITH RADIOACTIVE SEED LOCALIZATION;  Surgeon: Harriette Bouillon, MD;  Location: [facility];  Service: General;  Laterality: Left;   BREAST SURGERY  1990   rt br bx   CARDIAC CATHETERIZATION  2011   ablasion   CARDIOVERSION N/A 08/10/2017  Procedure: CARDIOVERSION;  Surgeon: Jake Bathe, MD;  Location: [facility]OSCOPY;  Service: Cardiovascular;  Laterality: N/A;   COLONOSCOPY     DILATION AND CURETTAGE OF UTERUS     Left Breast Biopsy  01/15/15   PACEMAKER IMPLANT N/A 08/17/2017   Procedure: Pacemaker Implant;  Surgeon: Marinus Maw, MD;  Location: [facility] INVASIVE CV LAB;  Service: Cardiovascular;  Laterality: N/A;   TUBAL LIGATION       reports that she has never smoked. She has never used smokeless tobacco. She reports  current alcohol use. She reports that she does not use drugs.  Family History  Problem Relation Age of Onset   Hypertension Mother    Heart disease Mother    Arthritis Mother    Other Father        hardening of the arteries   Heart Problems Sister        pacemaker   Heart attack Brother    Throat cancer Paternal Uncle    Breast cancer Sister        early 70s   Alzheimer's disease Sister    Pulmonary disease Sister    Breast cancer Daughter 28       Negative genetic testing    Breast cancer Paternal Aunt        dx over 75   Cancer Paternal Aunt        cancer in her leg   Breast cancer Cousin        multiple paternal cousin    Prior to Admission medications   Medication Sig Start Date End Date Taking? Authorizing Provider  amiodarone (PACERONE) 200 MG tablet TAKE 1/2 TABLET BY MOUTH ONCE DAILY 06/28/23   Marinus Maw, MD  apixaban (ELIQUIS) 5 MG TABS tablet Take 1 tablet (5 mg total) by mouth 2 (two) times daily. 10/04/23   Marinus Maw, MD  Carboxymethylcellul-Glycerin (LUBRICATING EYE DROPS OP) Apply 1 drop to eye daily as needed (dry eyes).    [provider]  levothyroxine (SYNTHROID) 125 MCG tablet Take 125 mcg by mouth daily. 05/30/23   [provider]  losartan (COZAAR) 50 MG tablet Take 1 tablet (50 mg total) by mouth daily. 03/29/23   Marinus Maw, MD  metoprolol succinate (TOPROL-XL) 25 MG 24 hr tablet Take 1 tablet (25 mg total) by mouth daily. 05/18/23   Marinus Maw, MD  phenazopyridine (PYRIDIUM) 95 MG tablet Take 1 tablet (95 mg total) by mouth 3 (three) times daily as needed for pain. 10/07/23   Selmer Dominion, NP  triamcinolone cream (KENALOG) 0.1 % Apply topically as needed for rash. 12/15/21   [provider]  Vibegron (GEMTESA) 75 MG TABS Take 1 tablet (75 mg total) by mouth daily. 09/29/23   Loleta Chance, MD  Vibegron (GEMTESA) 75 MG TABS Take 1 tablet (75 mg total) by mouth daily. 11/03/23   Loleta Chance, MD  Vitamin D,  Ergocalciferol, (DRISDOL) 50000 units CAPS capsule Take 50,000 Units by mouth every 7 (seven) days.    [provider]    Physical Exam: Vitals:   12/09/23 1710 12/09/23 1714 12/09/23 1800  BP:  (!) 162/88 130/73  Pulse:  73 70  Resp:  (!) 29 15  Temp:  98 F (36.7 C)   TempSrc:  Oral   SpO2: 100% 99% 90%   Physical Exam Constitutional:      Appearance: She is normal weight.  HENT:     Head: Normocephalic.  Mouth/Throat:     Mouth: Mucous membranes are moist.  Eyes:     Extraocular Movements: Extraocular movements intact.     Pupils: Pupils are equal, round, and reactive to light.  Cardiovascular:     Rate and Rhythm: Normal rate and regular rhythm.  Pulmonary:     Effort: Respiratory distress present.  Abdominal:     Palpations: Abdomen is soft.  Musculoskeletal:        General: Normal range of motion.     Cervical back: Normal range of motion.  Skin:    General: Skin is warm.     Capillary Refill: Capillary refill takes less than 2 seconds.  Neurological:     General: No focal deficit present.     Mental Status: She is alert.  Psychiatric:        Mood and Affect: Mood normal.      Labs on Admission: I have personally reviewed the patients's labs and imaging studies.  Assessment/Plan Principal Problem:   Acute hypoxic respiratory failure (HCC)   # Acute hypoxic respiratory failure most likely secondary to interstitial lung disease and/or amiodarone toxicity -Patient presents with wheezing His chest x-ray shows no acute infiltrates - Known history of COPD -CT with fibrosis -productive cough Plan: DuoNebs Pulmicort and Brovana 5 days of steroids Continue amiodarone though will need outpatient conversation regarding change in medication. Start azithromycin  # Paroxysmal A-fib-continue amiodarone, Eliquis.  Will recommend discontinuation of amiodarone  # Hypothyroidism-continue levothyroxine  # History of breast cancer-continue to  monitor  # Hypertension-continue losartan, metoprolol  # Urine retention-continue Myrbetriq's  # Hypokalemia-continue potassium   Admission status: Inpatient Telemetry  Certification: The appropriate patient status for this patient is INPATIENT. Inpatient status is judged to be reasonable and necessary in order to provide the required intensity of service to ensure the patient's safety. The patient's presenting symptoms, physical exam findings, and initial radiographic and laboratory data in the context of their chronic comorbidities is felt to place them at high risk for further clinical deterioration. Furthermore, it is not anticipated that the patient will be medically stable for discharge from the hospital within 2 midnights of admission.   * I certify that at the point of admission it is my clinical judgment that the patient will require inpatient hospital care spanning beyond 2 midnights from the point of admission due to high intensity of service, high risk for further deterioration and high frequency of surveillance required.Alan Mulder MD [facility] If 7PM-7AM, please contact night-coverage [email or web address]  12/09/2023, 7:33 PM

## 2023-12-09 NOTE — ED Provider Notes (Signed)
[place] EMERGENCY DEPARTMENT AT [facility] Provider Note   CSN: [id number] Arrival date & time: 12/09/23  1703     History  Chief Complaint  Patient presents with   Shortness of Breath   Cough    [name] is a 89 y.o. female history of A-fib on Eliquis, hypertension, here presenting with shortness of breath and cough.  Patient just came back from [place] several days ago.  Patient has been having some nonproductive cough.  Also some low-grade temperature and chills.  Patient was noted to be wheezing per family and went to urgent care.  She was noted that her oxygen is borderline 89 to 90% and EMS was called.  Patient was given 125 mg of Solu-Medrol by EMS and 2 DuoNebs.  Patient denies any history of COPD.  Patient is not on oxygen at baseline  The history is provided by the patient.       Home Medications Prior to Admission medications   Medication Sig Start Date End Date Taking? Authorizing Provider  amiodarone (PACERONE) 200 MG tablet TAKE 1/2 TABLET BY MOUTH ONCE DAILY 06/28/23   Marinus Maw, MD  apixaban (ELIQUIS) 5 MG TABS tablet Take 1 tablet (5 mg total) by mouth 2 (two) times daily. 10/04/23   Marinus Maw, MD  Carboxymethylcellul-Glycerin (LUBRICATING EYE DROPS OP) Apply 1 drop to eye daily as needed (dry eyes).    [provider]  levothyroxine (SYNTHROID) 125 MCG tablet Take 125 mcg by mouth daily. 05/30/23   [provider]  losartan (COZAAR) 50 MG tablet Take 1 tablet (50 mg total) by mouth daily. 03/29/23   Marinus Maw, MD  metoprolol succinate (TOPROL-XL) 25 MG 24 hr tablet Take 1 tablet (25 mg total) by mouth daily. 05/18/23   Marinus Maw, MD  phenazopyridine (PYRIDIUM) 95 MG tablet Take 1 tablet (95 mg total) by mouth 3 (three) times daily as needed for pain. 10/07/23   Selmer Dominion, NP  triamcinolone cream (KENALOG) 0.1 % Apply topically as needed for rash. 12/15/21   [provider]  Vibegron  (GEMTESA) 75 MG TABS Take 1 tablet (75 mg total) by mouth daily. Patient not taking: Reported on 11/03/2023 09/29/23   Wyatt Haste T, MD  Vibegron (GEMTESA) 75 MG TABS Take 1 tablet (75 mg total) by mouth daily. 11/03/23   Loleta Chance, MD  Vitamin D, Ergocalciferol, (DRISDOL) 50000 units CAPS capsule Take 50,000 Units by mouth every 7 (seven) days.    [provider]      Allergies    Adhesive [tape], Codeine, Latex, and Percodan [oxycodone-aspirin]    Review of Systems   Review of Systems  Respiratory:  Positive for cough and shortness of breath.   All other systems reviewed and are negative.   Physical Exam Updated Vital Signs BP 130/73   Pulse 70   Temp 98 F (36.7 C) (Oral)   Resp 15   LMP  (LMP Unknown)   SpO2 90%  Physical Exam Vitals and nursing note reviewed.  Constitutional:      Comments: Tachypneic  HENT:     Head: Normocephalic.     Mouth/Throat:     Mouth: Mucous membranes are moist.  Eyes:     Extraocular Movements: Extraocular movements intact.     Pupils: Pupils are equal, round, and reactive to light.  Cardiovascular:     Rate and Rhythm: Normal rate and regular rhythm.  Pulmonary:     Comments:  Slightly tachypneic and diffuse wheezing with no obvious retractions Musculoskeletal:        General: Normal range of motion.     Cervical back: Normal range of motion and neck supple.  Skin:    General: Skin is warm.     Capillary Refill: Capillary refill takes less than 2 seconds.  Psychiatric:        Mood and Affect: Mood normal.     ED Results / Procedures / Treatments   Labs (all labs ordered are listed, but only abnormal results are displayed) Labs Reviewed  COMPREHENSIVE METABOLIC PANEL - Abnormal; Notable for the following components:      Result Value   Sodium 134 (*)    Potassium 3.2 (*)    Glucose, Bld 146 (*)    Calcium 8.2 (*)    Total Bilirubin 1.2 (*)    All other components within normal limits  RESP PANEL BY RT-PCR (RSV,  FLU A&B, COVID)  RVPGX2  CBC WITH DIFFERENTIAL/PLATELET    EKG None  Radiology DG Chest 2 View Result Date: 12/09/2023 CLINICAL DATA:  Shortness of breath EXAM: CHEST - 2 VIEW COMPARISON:  10/18/2022 FINDINGS: Patient is slightly rotated. The heart size and mediastinal contours are stable. Left-sided implanted cardiac device remains in place. No focal airspace consolidation, pleural effusion, or pneumothorax. Exaggerated thoracic kyphosis. IMPRESSION: No active cardiopulmonary disease. Electronically Signed   By: Duanne Guess D.O.   On: 12/09/2023 17:59    Procedures Procedures    CRITICAL CARE Performed by: Richardean Canal   Total critical care time: 34 minutes  Critical care time was exclusive of separately billable procedures and treating other patients.  Critical care was necessary to treat or prevent imminent or life-threatening deterioration.  Critical care was time spent personally by me on the following activities: development of treatment plan with patient and/or surrogate as well as nursing, discussions with consultants, evaluation of patient's response to treatment, examination of patient, obtaining history from patient or surrogate, ordering and performing treatments and interventions, ordering and review of laboratory studies, ordering and review of radiographic studies, pulse oximetry and re-evaluation of patient's condition.   Medications Ordered in ED Medications  magnesium sulfate IVPB 2 g 50 mL (2 g Intravenous New Bag/Given 12/09/23 1806)  albuterol (PROVENTIL) (2.5 MG/3ML) 0.083% nebulizer solution 5 mg (5 mg Nebulization Given 12/09/23 1805)  ipratropium (ATROVENT) nebulizer solution 0.5 mg (0.5 mg Nebulization Given 12/09/23 1806)    ED Course/ Medical Decision Making/ A&P                                 Medical Decision Making [name] is a 89 y.o. female here presenting with shortness of breath and wheezing.  Likely bronchitis versus COPD versus  pneumonia versus COVID or flu.  Patient is already on Eliquis for A-fib and have low suspicion for PE despite the recent travel.  Plan to get CBC and BMP and COVID and flu and RSV test and chest x-ray.  Patient received steroids already.  Will give DuoNeb and magnesium and reassess  7:03 PM I reviewed patient's labs and they are unremarkable and COVID test is negative.  Chest x-ray is clear.  Patient received another neb and magnesium.  Minimal wheezing now.  However whenever she is taken off oxygen she would desat to about 88%.  At this point patient will be admitted for bronchitis with hypoxia  Problems Addressed: Bronchitis:  acute illness or injury Hypoxia: acute illness or injury  Amount and/or Complexity of Data Reviewed Labs: ordered. Decision-making details documented in ED Course. Radiology: ordered and independent interpretation performed. Decision-making details documented in ED Course.  Risk Prescription drug management.    Final Clinical Impression(s) / ED Diagnoses Final diagnoses:  None    Rx / DC Orders ED Discharge Orders     None         Charlynne Pander, MD 12/09/23 1904

## 2023-12-09 NOTE — ED Provider Triage Note (Signed)
Emergency Medicine Provider Triage Evaluation Note  [name] , a 89 y.o. female  was evaluated in triage.  Pt complains of SOB.  Endorse having some sore throat, progressive worsening cough and now having shortness of breath ongoing for the past week.  Also recently traveled from [place].  Has history of A-fib currently on Eliquis and has been pretty compliant with her medication except for today.  No prior she of PE or DVT.  No fever chills body aches nausea vomiting diarrhea  Review of Systems  Positive: As above Negative: As above  Physical Exam  BP (!) 162/88 (BP Location: Right Arm)   Pulse 73   Temp 98 F (36.7 C) (Oral)   Resp (!) 29   LMP  (LMP Unknown)   SpO2 99%  Gen:   Awake, no distress   Resp:  Normal effort  MSK:   Moves extremities without difficulty  Other:    Medical Decision Making  Medically screening exam initiated at 5:33 PM.  Appropriate orders placed.  [name] was informed that the remainder of the evaluation will be completed by another provider, this initial triage assessment does not replace that evaluation, and the importance of remaining in the ED until their evaluation is complete.     Fayrene Helper, PA-C 12/09/23 1734

## 2023-12-09 NOTE — ED Notes (Signed)
ED TO INPATIENT HANDOFF REPORT  ED Nurse Name and Phone #: Rebecca Eaton RN [phone]  S Name/Age/Gender [name] 89 y.o. female Room/Bed: WA21/WA21  Code Status   Code Status: Full Code  Home/SNF/Other Home Patient oriented to: self, place, time, and situation Is this baseline? Yes   Triage Complete: Triage complete  Chief Complaint Acute hypoxic respiratory failure (HCC) [J96.01]  Triage Note Pt [facility]. Pt reports difficulty breathing, sob, and wheezing for about 1 week. Ems did administer 2 duonebs and 125mg  of solumedrol. Pt AxOx4. Pt did have a recent flight from [place].    Allergies Allergies  Allergen Reactions   Adhesive [Tape] Rash and Other (See Comments)    Including EKG electrodes and the like   Codeine Other (See Comments)    Reaction not recalled   Latex Itching   Oxycodone-Aspirin Nausea And Vomiting   Percodan [Oxycodone-Aspirin] Nausea And Vomiting    Level of Care/Admitting Diagnosis ED Disposition     ED Disposition  Admit   Condition  --   Comment  Hospital Area: [facility] [[id number]]  Level of Care: Telemetry [5]  Admit to tele based on following criteria: Eval of Syncope  May admit patient to [facility] or [facility] if equivalent level of care is available:: No  Covid Evaluation: Asymptomatic - no recent exposure (last 10 days) testing not required  Diagnosis: Acute hypoxic respiratory failure [facility]) [[id number]]  Admitting Physician: Alan Mulder [[id number]]  Attending Physician: Alan Mulder [[id number]]  Certification:: I certify this patient will need inpatient services for at least 2 midnights  Expected Medical Readiness: 12/13/2023          B Medical/Surgery History Past Medical History:  Diagnosis Date   Allergic rhinitis    Arthritis    bil knees   Breast cancer, left breast (HCC) 01/15/2015   Invasive Mammary   Chronic venous insufficiency    Hematuria    negative workup -  Dr. Wanda Plump   Hypertension    Hypothyroidism    Migraine headache    MVP (mitral valve prolapse)    OAB (overactive bladder)    Pacemaker    PAF (paroxysmal atrial fibrillation) (HCC)    SVT (supraventricular tachycardia) (HCC) 06/2010   s/p AVNRT ablation   Wears glasses    Past Surgical History:  Procedure Laterality Date   A-FLUTTER ABLATION N/A 02/04/2017   Procedure: A-Flutter Ablation;  Surgeon: Marinus Maw, MD;  Location: [facility] INVASIVE CV LAB;  Service: Cardiovascular;  Laterality: N/A;   ABDOMINAL HYSTERECTOMY     AV NODE ABLATION N/A 08/17/2017   Procedure: AV Node Ablation;  Surgeon: Marinus Maw, MD;  Location: [facility] INVASIVE CV LAB;  Service: Cardiovascular;  Laterality: N/A;   BREAST LUMPECTOMY WITH RADIOACTIVE SEED LOCALIZATION Left 02/14/2015   Procedure: BREAST LUMPECTOMY WITH RADIOACTIVE SEED LOCALIZATION;  Surgeon: Ovidio Kin, MD;  Location: [facility];  Service: General;  Laterality: Left;   BREAST LUMPECTOMY WITH RADIOACTIVE SEED LOCALIZATION Left 11/18/2021   Procedure: LEFT BREAST LUMPECTOMY WITH RADIOACTIVE SEED LOCALIZATION;  Surgeon: Harriette Bouillon, MD;  Location:  SURGERY CENTER;  Service: General;  Laterality: Left;   BREAST SURGERY  1990   rt br bx   CARDIAC CATHETERIZATION  2011   ablasion   CARDIOVERSION N/A 08/10/2017   Procedure: CARDIOVERSION;  Surgeon: Jake Bathe, MD;  Location: [facility] ENDOSCOPY;  Service: Cardiovascular;  Laterality: N/A;   COLONOSCOPY     DILATION AND  CURETTAGE OF UTERUS     Left Breast Biopsy  01/15/15   PACEMAKER IMPLANT N/A 08/17/2017   Procedure: Pacemaker Implant;  Surgeon: Marinus Maw, MD;  Location: [facility] INVASIVE CV LAB;  Service: Cardiovascular;  Laterality: N/A;   TUBAL LIGATION       A IV Location/Drains/Wounds Patient Lines/Drains/Airways Status     Active Line/Drains/Airways     Name Placement date Placement time Site Days   Peripheral IV 12/09/23 20 G Right Antecubital 12/09/23  1711   Antecubital  less than 1            Intake/Output Last 24 hours  Intake/Output Summary (Last 24 hours) at 12/09/2023 2004 Last data filed at 12/09/2023 1924 Gross per 24 hour  Intake 50 ml  Output --  Net 50 ml    Labs/Imaging Results for orders placed or performed during the hospital encounter of 12/09/23 (from the past 48 hours)  CBC with Differential     Status: None   Collection Time: 12/09/23  5:27 PM  Result Value Ref Range   WBC 8.3 4.0 - 10.5 K/uL   RBC 4.16 3.87 - 5.11 MIL/uL   Hemoglobin 12.8 12.0 - 15.0 g/dL   HCT 78.2 95.6 - 21.3 %   MCV 94.7 80.0 - 100.0 fL   MCH 30.8 26.0 - 34.0 pg   MCHC 32.5 30.0 - 36.0 g/dL   RDW 08.6 57.8 - 46.9 %   Platelets 159 150 - 400 K/uL   nRBC 0.0 0.0 - 0.2 %   Neutrophils Relative % 79 %   Neutro Abs 6.4 1.7 - 7.7 K/uL   Lymphocytes Relative 16 %   Lymphs Abs 1.3 0.7 - 4.0 K/uL   Monocytes Relative 5 %   Monocytes Absolute 0.5 0.1 - 1.0 K/uL   Eosinophils Relative 0 %   Eosinophils Absolute 0.0 0.0 - 0.5 K/uL   Basophils Relative 0 %   Basophils Absolute 0.0 0.0 - 0.1 K/uL   Immature Granulocytes 0 %   Abs Immature Granulocytes 0.02 0.00 - 0.07 K/uL    Comment: Performed at [facility], [address], [place], [place] [address]  Comprehensive metabolic panel     Status: Abnormal   Collection Time: 12/09/23  5:27 PM  Result Value Ref Range   Sodium 134 (L) 135 - 145 mmol/L   Potassium 3.2 (L) 3.5 - 5.1 mmol/L   Chloride 100 98 - 111 mmol/L   CO2 23 22 - 32 mmol/L   Glucose, Bld 146 (H) 70 - 99 mg/dL    Comment: Glucose reference range applies only to samples taken after fasting for at least 8 hours.   BUN 16 8 - 23 mg/dL   Creatinine, Ser 8.41 0.44 - 1.00 mg/dL   Calcium 8.2 (L) 8.9 - 10.3 mg/dL   Total Protein 7.7 6.5 - 8.1 g/dL   Albumin 3.8 3.5 - 5.0 g/dL   AST 24 15 - 41 U/L   ALT 13 0 - 44 U/L   Alkaline Phosphatase 92 38 - 126 U/L   Total Bilirubin 1.2 (H) <1.2 mg/dL   GFR, Estimated  >32 >44 mL/min    Comment: (NOTE) Calculated using the CKD-EPI Creatinine Equation (2021)    Anion gap 11 5 - 15    Comment: Performed at [facility], [address], [place], [place] [address]  Resp panel by RT-PCR (RSV, Flu A&B, Covid) Anterior Nasal Swab     Status: None   Collection  Time: 12/09/23  5:38 PM   Specimen: Anterior Nasal Swab  Result Value Ref Range   SARS Coronavirus 2 by RT PCR NEGATIVE NEGATIVE    Comment: (NOTE) SARS-CoV-2 target nucleic acids are NOT DETECTED.  The SARS-CoV-2 RNA is generally detectable in upper respiratory specimens during the acute phase of infection. The lowest concentration of SARS-CoV-2 viral copies this assay can detect is 138 copies/mL. A negative result does not preclude SARS-Cov-2 infection and should not be used as the sole basis for treatment or other patient management decisions. A negative result may occur with  improper specimen collection/handling, submission of specimen other than nasopharyngeal swab, presence of viral mutation(s) within the areas targeted by this assay, and inadequate number of viral copies(<138 copies/mL). A negative result must be combined with clinical observations, patient history, and epidemiological information. The expected result is Negative.  Fact Sheet for Patients:  BloggerCourse.com  Fact Sheet for Healthcare Providers:  SeriousBroker.it  This test is no t yet approved or cleared by the [place] FDA and  has been authorized for detection and/or diagnosis of SARS-CoV-2 by FDA under an Emergency Use Authorization (EUA). This EUA will remain  in effect (meaning this test can be used) for the duration of the COVID-19 declaration under Section 564(b)(1) of the Act, 21 U.S.C.section 360bbb-3(b)(1), unless the authorization is terminated  or revoked sooner.       Influenza A by PCR NEGATIVE NEGATIVE   Influenza B by PCR  NEGATIVE NEGATIVE    Comment: (NOTE) The Xpert Xpress SARS-CoV-2/FLU/RSV plus assay is intended as an aid in the diagnosis of influenza from Nasopharyngeal swab specimens and should not be used as a sole basis for treatment. Nasal washings and aspirates are unacceptable for Xpert Xpress SARS-CoV-2/FLU/RSV testing.  Fact Sheet for Patients: BloggerCourse.com  Fact Sheet for Healthcare Providers: SeriousBroker.it  This test is not yet approved or cleared by the [place] FDA and has been authorized for detection and/or diagnosis of SARS-CoV-2 by FDA under an Emergency Use Authorization (EUA). This EUA will remain in effect (meaning this test can be used) for the duration of the COVID-19 declaration under Section 564(b)(1) of the Act, 21 U.S.C. section 360bbb-3(b)(1), unless the authorization is terminated or revoked.     Resp Syncytial Virus by PCR NEGATIVE NEGATIVE    Comment: (NOTE) Fact Sheet for Patients: BloggerCourse.com  Fact Sheet for Healthcare Providers: SeriousBroker.it  This test is not yet approved or cleared by the [place] FDA and has been authorized for detection and/or diagnosis of SARS-CoV-2 by FDA under an Emergency Use Authorization (EUA). This EUA will remain in effect (meaning this test can be used) for the duration of the COVID-19 declaration under Section 564(b)(1) of the Act, 21 U.S.C. section 360bbb-3(b)(1), unless the authorization is terminated or revoked.  Performed at [facility], [address], [place], [place] [address]    DG Chest 2 View Result Date: 12/09/2023 CLINICAL DATA:  Shortness of breath EXAM: CHEST - 2 VIEW COMPARISON:  10/18/2022 FINDINGS: Patient is slightly rotated. The heart size and mediastinal contours are stable. Left-sided implanted cardiac device remains in place. No focal airspace consolidation,  pleural effusion, or pneumothorax. Exaggerated thoracic kyphosis. IMPRESSION: No active cardiopulmonary disease. Electronically Signed   By: Duanne Guess D.O.   On: 12/09/2023 17:59    Pending Labs Unresulted Labs (From admission, onward)     Start     Ordered   12/10/23 0500  Basic metabolic panel  Tomorrow morning,  R        12/09/23 1932   12/10/23 0500  CBC  Tomorrow morning,   R        12/09/23 1932            Vitals/Pain Today's Vitals   12/09/23 1710 12/09/23 1714 12/09/23 1800  BP:  (!) 162/88 130/73  Pulse:  73 70  Resp:  (!) 29 15  Temp:  98 F (36.7 C)   TempSrc:  Oral   SpO2: 100% 99% 90%    Isolation Precautions No active isolations  Medications Medications  amiodarone (PACERONE) tablet 100 mg (has no administration in time range)  losartan (COZAAR) tablet 50 mg (has no administration in time range)  metoprolol succinate (TOPROL-XL) 24 hr tablet 25 mg (has no administration in time range)  levothyroxine (SYNTHROID) tablet 125 mcg (has no administration in time range)  mirabegron ER (MYRBETRIQ) tablet 25 mg (has no administration in time range)  apixaban (ELIQUIS) tablet 5 mg (has no administration in time range)  acetaminophen (TYLENOL) tablet 650 mg (has no administration in time range)    Or  acetaminophen (TYLENOL) suppository 650 mg (has no administration in time range)  HYDROcodone-acetaminophen (NORCO/VICODIN) 5-325 MG per tablet 1-2 tablet (has no administration in time range)  ondansetron (ZOFRAN) tablet 4 mg (has no administration in time range)    Or  ondansetron (ZOFRAN) injection 4 mg (has no administration in time range)  ipratropium-albuterol (DUONEB) 0.5-2.5 (3) MG/3ML nebulizer solution 3 mL (has no administration in time range)  budesonide (PULMICORT) nebulizer solution 0.25 mg (has no administration in time range)  arformoterol (BROVANA) nebulizer solution 15 mcg (has no administration in time range)  predniSONE (DELTASONE) tablet  50 mg (has no administration in time range)  albuterol (PROVENTIL) (2.5 MG/3ML) 0.083% nebulizer solution 5 mg (5 mg Nebulization Given 12/09/23 1805)  ipratropium (ATROVENT) nebulizer solution 0.5 mg (0.5 mg Nebulization Given 12/09/23 1806)  magnesium sulfate IVPB 2 g 50 mL (0 g Intravenous Stopped 12/09/23 1924)  sodium chloride 0.9 % bolus 500 mL (500 mLs Intravenous New Bag/Given 12/09/23 1924)  potassium chloride (KLOR-CON) packet 40 mEq (40 mEq Oral Given 12/09/23 2001)    Mobility walks     Focused Assessments     R Recommendations: See Admitting Provider Note  Report given to:   Additional Notes:

## 2023-12-09 NOTE — ED Triage Notes (Addendum)
[facility] from [facility]. Pt reports difficulty breathing, sob, and wheezing for about 1 week. Ems did administer 2 duonebs and 125mg  of solumedrol. Pt AxOx4. Pt did have a recent flight from [place].

## 2023-12-10 ENCOUNTER — Other Ambulatory Visit (HOSPITAL_COMMUNITY): Payer: Self-pay

## 2023-12-10 DIAGNOSIS — J849 Interstitial pulmonary disease, unspecified: Secondary | ICD-10-CM | POA: Diagnosis not present

## 2023-12-10 DIAGNOSIS — J9601 Acute respiratory failure with hypoxia: Secondary | ICD-10-CM | POA: Diagnosis not present

## 2023-12-10 LAB — CBC
HCT: 35.7 % — ABNORMAL LOW (ref 36.0–46.0)
Hemoglobin: 11.7 g/dL — ABNORMAL LOW (ref 12.0–15.0)
MCH: 30.5 pg (ref 26.0–34.0)
MCHC: 32.8 g/dL (ref 30.0–36.0)
MCV: 93 fL (ref 80.0–100.0)
Platelets: 148 10*3/uL — ABNORMAL LOW (ref 150–400)
RBC: 3.84 MIL/uL — ABNORMAL LOW (ref 3.87–5.11)
RDW: 15.3 % (ref 11.5–15.5)
WBC: 6.5 10*3/uL (ref 4.0–10.5)
nRBC: 0 % (ref 0.0–0.2)

## 2023-12-10 LAB — BASIC METABOLIC PANEL
Anion gap: 10 (ref 5–15)
BUN: 17 mg/dL (ref 8–23)
CO2: 21 mmol/L — ABNORMAL LOW (ref 22–32)
Calcium: 8.5 mg/dL — ABNORMAL LOW (ref 8.9–10.3)
Chloride: 101 mmol/L (ref 98–111)
Creatinine, Ser: 0.75 mg/dL (ref 0.44–1.00)
GFR, Estimated: >60 mL/min (ref >60)
Glucose, Bld: 151 mg/dL — ABNORMAL HIGH (ref 70–99)
Potassium: 4.9 mmol/L (ref 3.5–5.1)
Sodium: 132 mmol/L — ABNORMAL LOW (ref 135–145)

## 2023-12-10 MED ORDER — PANTOPRAZOLE SODIUM 20 MG PO TBEC
20.0000 mg | DELAYED_RELEASE_TABLET | Freq: Every day | ORAL | Status: DC
  Administered 2023-12-10 – 2023-12-12 (×3): 20 mg via ORAL
  Filled 2023-12-10 (×4): qty 1

## 2023-12-10 MED ORDER — IPRATROPIUM-ALBUTEROL 0.5-2.5 (3) MG/3ML IN SOLN
3.0000 mL | Freq: Two times a day (BID) | RESPIRATORY_TRACT | Status: DC
  Administered 2023-12-10 – 2023-12-12 (×4): 3 mL via RESPIRATORY_TRACT
  Filled 2023-12-10 (×5): qty 3

## 2023-12-10 MED ORDER — FUROSEMIDE 10 MG/ML IJ SOLN
40.0000 mg | Freq: Once | INTRAMUSCULAR | Status: AC
  Administered 2023-12-10: 40 mg via INTRAVENOUS
  Filled 2023-12-10: qty 4

## 2023-12-10 NOTE — Hospital Course (Signed)
The patient is an 89 year old Caucasian female with a past medical history significant for Manson Passey to breast cancer, hypertension, hypothyroidism, paroxysmal atrial fibrillation on anticoagulation, history of ILD, chronic diastolic CHF, mitral valve prolapse, history of DVT and other comorbidities who presented to the ED with worsening shortness of breath and cough.  She had been traveling several days and developed a nonproductive cough as well as subjective fever and noticed wheezing in her lungs.  She went to the urgent care was found to be hypoxic so she is in the ED for further evaluation assessment.  On arrival to the ED she was afebrile and hemodynamic stable and had to be placed on 2 L supplemental oxygen where her O2 saturations were then around 90%.  Chest x-ray showed no acute findings and she was given nebulizers and when a ambulatory home O2 screen was done and resulted in persistent hypoxia so she was admitted for further workup.  Pulmonary's been consulted for further evaluation recommendations and adjustments have been made.  Assessment and Plan:  Acute Hypoxic Respiratory Failure most likely secondary to Interstitial Lung Disease -Patient presents with wheezing -Her CXR was done and showed "Patient is slightly rotated. The heart size and mediastinal contours are stable. Left-sided implanted cardiac device remains in place. No focal airspace consolidation, pleural effusion, or pneumothorax. Exaggerated thoracic kyphosis." -CT Chest w/o Contrast "Cardiomegaly, with prominent biatrial dilatation.4.7 cm ascending thoracic aortic aneurysm. Progressive subpleural pulmonary fibrotic changes, with biapical ground-glass opacities consistent with superimposed alveolitis. Slight progression of the bilateral cylindrical bronchiectasis seen previously. Trace bilateral pleural effusions. Aortic Atherosclerosis." -Patient has a Known history of COPD and prior CT's with fibrosis -Pulmonary Consulted and  recommending continuing O2 for a Supplemental Goal > 88% -SpO2: 96 % O2 Flow Rate (L/min): 3 L/min -Continuous pulse oximetry and maintain O2 saturation greater than 88% -Continue supplemental oxygen via nasal cannula wean O2 as tolerated -Per pulmonary she will need extended prednisone taper and recommending 50 mg daily for 7 days and decreasing by 10 mg weekly -Will continue with DuoNebs twice daily and Pulmicort and Brovana -Will need pulmonary hygiene including incentive spirometer and out of bed was tolerated -Per pulmonary recommend continue Amio for now but will need to discuss with cardiology about ongoing use of ILD demonstrates progression on her outpatient PFTs -C/w Azithromycin 500 mg po qHS   Paroxysmal A-Fib -Continue Amiodarone 100 mg po at bedtime for now as Pulmonary feels Symptoms are from a ILD Flare -C/w Anticoagulation with Apixaban 5 mg po BID -Continue to Monitor on Telemetry   Hypothyroidism -Continue Levothyroxine 125 mcg po Daily    History of Breast Cancer -Continue to monitor   Essential Hypertension -Continue Losartan 50 mg po daily and Metoprolol Succinate 25 mg po at bedtime -Continue to Monitor BP per Protocol  -Last BP reading was    Urinary Retention -Continue Mirabegron ER 25 mg po at bedtime  Hyperbilirubinemia -Bilirubin Trend: Recent Labs  Lab 12/09/23 1727  BILITOT 1.2*  -Continue to Monitor and Trend and repeat CMP in the AM   Metabolic Acidosis -Mild. Patient has a CO2 of 21, AG 10, and Chloride Level of 101 -Continue to Monitor and Trend and repeat CMP in the AM    Hypokalemia -Patient's K+ Level Trend: Recent Labs  Lab 12/09/23 1727 12/10/23 0539  K 3.2* 4.9  -Replete with po KCL 40 mEQ x1 -Continue to Monitor and Replete as Necessary -Repeat CMP in the AM   Normocytic Anemia -Hgb/Hct Trend: Recent Labs  Lab 12/09/23 1727 12/10/23 0539  HGB 12.8 11.7*  HCT 39.4 35.7*  MCV 94.7 93.0  -Check Anemia Panel in the  AM -Continue to Monitor for S/Sx of Bleeding; No overt bleeding noted -Repeat CBC in the AM   Thrombocytopenia -Platelet Count Trend: Recent Labs  Lab 12/09/23 1727 12/10/23 0539  PLT 159 148*  -Continue to Monitor for S/Sx of Bleeding; No over bleeding noted -Repeat CBC in the AM   GERD/GI Prophylaxis -C/w Pantoprazole 20 mg po Daily

## 2023-12-10 NOTE — Consult Note (Signed)
NAME:  [name], MRN:  [id number], DOB:  June 15, 1933, LOS: 1 ADMISSION DATE:  12/09/2023, CONSULTATION DATE:  12/10/23   REFERRING MD:  Marguerita Merles, MD CHIEF COMPLAINT:  ILD vs amio toxicity   History of Present Illness:  89 year old female with hx ILD, atrial fib/DVT, chronic diastolic heart failure, mitral valve prolapse who presented to the ED with shortness of breath and cough. She recently traveled to [place] for 8 days and after returning had developed a nonproductive cough, wheeze, subjective fever. She went to urgent care and was found with SpO2 <88% so sent to ED. Started on 2L O2 and nebulizers. CXR negative. RVP negative. CT chest with progressive subpleural fibrosis and biapical GGO, trace bilateral pleural effusions.   She was previously following [facility] with Dr. Marchelle Gearing for NSIP and only had mild dyspnea at that time. She reports that her dyspnea has worsened in the last year and has attributed this primarily to her cardiac issues. Has not needed oxygen in the past.   Pertinent  Medical History  ILD, HTN, chronic diastolic heart failure, hx systolic heart failure s/p PM with normal EF, mitral valve prolapse, atrial fib/flutter on eliquis, SVT, hx breast cancer, hypothyroidism  Significant Hospital Events: Including procedures, antibiotic start and stop dates in addition to other pertinent events     Interim History / Subjective:  As above  Objective   Blood pressure 136/72, pulse 72, temperature 98.4 F (36.9 C), resp. rate 16, SpO2 97%.        Intake/Output Summary (Last 24 hours) at 12/10/2023 1054 Last data filed at 12/09/2023 2135 Gross per 24 hour  Intake 670 ml  Output --  Net 670 ml   There were no vitals filed for this visit.  Physical Exam: General: Elderly, well-appearing, no acute distress HENT: [place], AT, OP clear, MMM Eyes: EOMI, no scleral icterus Respiratory: Mild bibasilar inspiratory crackles, no wheezing Cardiovascular: RRR,  -M/R/G, no JVD Extremities:-Edema,-tenderness Neuro: AAO x4, CNII-XII grossly intact Psych: Normal mood, normal affect    Assessment & Plan:   Acute hypoxemic respiratory failure 2/2 ILD exacerbation History of NSIP Pulmonary consulted for recommendations for ILD management and evaluation for possible amiodarone toxicity. Patient reports has been on amio for 10 years however documentation comments no amio use until 2021 with cardiology. CT imaging reviewed since 2013 demonstrating mild NSIP changes prior to medication with progressive but minimal changes with subpleural and bronchiectasis. Possible that amio has been contributing but has been overall stable respiratory symptoms until the last year with progressive dyspnea. Suspect this is chronic underlying lung issues consistent with NSIP that has mildly progressed and now worsened in setting of exacerbation. Imaging not consistent with UIP or amio toxicity. Recent travel to [place] increased risk of ILD exacerbation with high altitude exposure and possible viral source - which is likely driving her symptoms and less likely acute amiodarone toxicity. Maybe mild heart failure with trace bilateral pleural effusions on imaging  --Wean supplemental O2 for goal >88%. Will need ambulatory O2 sats to determine home O2 needs --Agree with prednisone taper. Currently on 50 mg. Recommend decreasing by 10 mg weekly --Give lasix 40 mg once. May not need ongoing diuresis. Reassess --Will investigate outpatient ICS/LABA options prior to discharge --Continue nebulizers with pulmicort, brovana and duonebs BID --Pulmonary hygiene including IS and PT/OOB as tolerated --OK to continue amio for now but will need to discuss with cardiology about ongoing use if ILD demonstrates progression on outpatient PFTs --Will arrange  outpatient PFTs --Will arrange outpatient follow-up with me (Dr. Everardo All) when closer to discharge   Best Practice (right click and "Reselect  all SmartList Selections" daily)   Diet/type: Regular consistency (see orders) DVT prophylaxis DOAC Pressure ulcer(s): pressure ulcer assessment deferred  GI prophylaxis: PPI Added while on high dose steroids Lines: N/A Foley:  N/A Code Status:  full code Last date of multidisciplinary goals of care discussion [Per primary]  Labs   CBC: Recent Labs  Lab 12/09/23 1727 12/10/23 0539  WBC 8.3 6.5  NEUTROABS 6.4  --   HGB 12.8 11.7*  HCT 39.4 35.7*  MCV 94.7 93.0  PLT 159 148*    Basic Metabolic Panel: Recent Labs  Lab 12/09/23 1727 12/10/23 0539  NA 134* 132*  K 3.2* 4.9  CL 100 101  CO2 23 21*  GLUCOSE 146* 151*  BUN 16 17  CREATININE 0.66 0.75  CALCIUM 8.2* 8.5*   GFR: CrCl cannot be calculated (Unknown ideal weight.). Recent Labs  Lab 12/09/23 1727 12/10/23 0539  WBC 8.3 6.5    Liver Function Tests: Recent Labs  Lab 12/09/23 1727  AST 24  ALT 13  ALKPHOS 92  BILITOT 1.2*  PROT 7.7  ALBUMIN 3.8   No results for input(s): "LIPASE", "AMYLASE" in the last 168 hours. No results for input(s): "AMMONIA" in the last 168 hours.  ABG    Component Value Date/Time   PHART 7.446 08/11/2017 0801   PCO2ART 34.3 08/11/2017 0801   PO2ART 65.0 (L) 08/11/2017 0801   HCO3 23.6 08/11/2017 0801   TCO2 25 10/18/2022 1352   O2SAT 93.0 08/11/2017 0801     Coagulation Profile: No results for input(s): "INR", "PROTIME" in the last 168 hours.  Cardiac Enzymes: No results for input(s): "CKTOTAL", "CKMB", "CKMBINDEX", "TROPONINI" in the last 168 hours.  HbA1C: Hgb A1c MFr Bld  Date/Time Value Ref Range Status  11/28/2015 10:33 AM 5.8 4.6 - 6.5 % Final    Comment:    Glycemic Control Guidelines for People with Diabetes:Non Diabetic:  <6%Goal of Therapy: <7%Additional Action Suggested:  >8%   04/19/2010 08:58 PM (H) <5.7 % Final   5.9 (NOTE)                                                                       According to the ADA Clinical Practice  Recommendations for 2011, when HbA1c is used as a screening test:   >=6.5%   Diagnostic of Diabetes Mellitus           (if abnormal result  is confirmed)  5.7-6.4%   Increased risk of developing Diabetes Mellitus  References:Diagnosis and Classification of Diabetes Mellitus,Diabetes Care,2011,34(Suppl 1):S62-S69 and Standards of Medical Care in         Diabetes - 2011,Diabetes Care,2011,34  (Suppl 1):S11-S61.    CBG: No results for input(s): "GLUCAP" in the last 168 hours.  Review of Systems:   Review of Systems  Constitutional:  Negative for chills, diaphoresis, fever, malaise/fatigue and weight loss.  HENT:  Negative for congestion.   Respiratory:  Positive for cough, shortness of breath and wheezing. Negative for hemoptysis and sputum production.   Cardiovascular:  Negative for chest pain, palpitations and leg swelling.     Past Medical  History:  She,  has a past medical history of Allergic rhinitis, Arthritis, Breast cancer, left breast (HCC) (01/15/2015), Chronic venous insufficiency, Hematuria, Hypertension, Hypothyroidism, Migraine headache, MVP (mitral valve prolapse), OAB (overactive bladder), Pacemaker, PAF (paroxysmal atrial fibrillation) (HCC), SVT (supraventricular tachycardia) (HCC) (06/2010), and Wears glasses.   Surgical History:   Past Surgical History:  Procedure Laterality Date   A-FLUTTER ABLATION N/A 02/04/2017   Procedure: A-Flutter Ablation;  Surgeon: Marinus Maw, MD;  Location: [facility] INVASIVE CV LAB;  Service: Cardiovascular;  Laterality: N/A;   ABDOMINAL HYSTERECTOMY     AV NODE ABLATION N/A 08/17/2017   Procedure: AV Node Ablation;  Surgeon: Marinus Maw, MD;  Location: [facility] INVASIVE CV LAB;  Service: Cardiovascular;  Laterality: N/A;   BREAST LUMPECTOMY WITH RADIOACTIVE SEED LOCALIZATION Left 02/14/2015   Procedure: BREAST LUMPECTOMY WITH RADIOACTIVE SEED LOCALIZATION;  Surgeon: Ovidio Kin, MD;  Location: [facility];  Service: General;   Laterality: Left;   BREAST LUMPECTOMY WITH RADIOACTIVE SEED LOCALIZATION Left 11/18/2021   Procedure: LEFT BREAST LUMPECTOMY WITH RADIOACTIVE SEED LOCALIZATION;  Surgeon: Harriette Bouillon, MD;  Location: [facility];  Service: General;  Laterality: Left;   BREAST SURGERY  1990   rt br bx   CARDIAC CATHETERIZATION  2011   ablasion   CARDIOVERSION N/A 08/10/2017   Procedure: CARDIOVERSION;  Surgeon: Jake Bathe, MD;  Location: [facility];  Service: Cardiovascular;  Laterality: N/A;   COLONOSCOPY     DILATION AND CURETTAGE OF UTERUS     Left Breast Biopsy  01/15/15   PACEMAKER IMPLANT N/A 08/17/2017   Procedure: Pacemaker Implant;  Surgeon: Marinus Maw, MD;  Location: [facility] INVASIVE CV LAB;  Service: Cardiovascular;  Laterality: N/A;   TUBAL LIGATION       Social History:   reports that she has never smoked. She has never used smokeless tobacco. She reports current alcohol use. She reports that she does not use drugs.   Family History:  Her family history includes Alzheimer's disease in her sister; Arthritis in her mother; Breast cancer in her cousin, paternal aunt, and sister; Breast cancer (age of onset: 37) in her daughter; Cancer in her paternal aunt; Heart Problems in her sister; Heart attack in her brother; Heart disease in her mother; Hypertension in her mother; Other in her father; Pulmonary disease in her sister; Throat cancer in her paternal uncle.   Allergies Allergies  Allergen Reactions   Adhesive [Tape] Rash and Other (See Comments)    Including EKG electrodes and the like   Codeine Other (See Comments)    Reaction not recalled   Latex Itching   Oxycodone-Aspirin Nausea And Vomiting   Percodan [Oxycodone-Aspirin] Nausea And Vomiting     Home Medications  Prior to Admission medications   Medication Sig Start Date End Date Taking? Authorizing Provider  amiodarone (PACERONE) 200 MG tablet TAKE 1/2 TABLET BY MOUTH ONCE DAILY Patient taking differently: Take  100 mg by mouth at bedtime. 06/28/23  Yes Marinus Maw, MD  apixaban (ELIQUIS) 5 MG TABS tablet Take 1 tablet (5 mg total) by mouth 2 (two) times daily. 10/04/23  Yes Marinus Maw, MD  Carboxymethylcellul-Glycerin (LUBRICATING EYE DROPS OP) Place 1 drop into both eyes 3 (three) times daily as needed (for dryness).   Yes [provider]  levothyroxine (SYNTHROID) 125 MCG tablet Take 125 mcg by mouth daily before breakfast. 05/30/23  Yes [provider]  losartan (COZAAR) 50 MG tablet Take 1 tablet (50 mg  total) by mouth daily. 03/29/23  Yes Marinus Maw, MD  metoprolol succinate (TOPROL-XL) 25 MG 24 hr tablet Take 1 tablet (25 mg total) by mouth daily. 05/18/23  Yes Marinus Maw, MD  MUCINEX 600 MG 12 hr tablet Take 600 mg by mouth 2 (two) times daily as needed for cough or to loosen phlegm.   Yes [provider]  mupirocin ointment (BACTROBAN) 2 % Apply 1 Application topically See admin instructions. Apply to affected area/ulcer on the right leg once a day   Yes [provider]  triamcinolone cream (KENALOG) 0.1 % Apply 1 Application topically daily as needed (for rashes- affected areas). 12/15/21  Yes [provider]  Vibegron (GEMTESA) 75 MG TABS Take 1 tablet (75 mg total) by mouth daily. Patient taking differently: Take 75 mg by mouth at bedtime. 09/29/23  Yes Loleta Chance, MD  Vitamin D, Ergocalciferol, (DRISDOL) 50000 units CAPS capsule Take 50,000 Units by mouth every Thursday.   Yes [provider]  phenazopyridine (PYRIDIUM) 95 MG tablet Take 1 tablet (95 mg total) by mouth 3 (three) times daily as needed for pain. Patient not taking: Reported on 12/09/2023 10/07/23   Selmer Dominion, NP  Vibegron (GEMTESA) 75 MG TABS Take 1 tablet (75 mg total) by mouth daily. Patient not taking: Reported on 12/09/2023 11/03/23   Loleta Chance, MD     Critical care time: N/A    Care Time: 42 min  Mechele Collin, M.D. [facility]monary/Critical  Care Medicine 12/10/2023 10:54 AM   See Amion for personal pager For hours between 7 PM to 7 AM, please call Elink for urgent questions

## 2023-12-10 NOTE — Progress Notes (Signed)
PROGRESS NOTE    [name]  UVO:[id number] DOB: [birth year] DOA: 12/09/2023 PCP: Thana Ates, MD   Brief Narrative:  The patient is an 89 year old Caucasian female with a past medical history significant for Manson Passey to breast cancer, hypertension, hypothyroidism, paroxysmal atrial fibrillation on anticoagulation, history of ILD, chronic diastolic CHF, mitral valve prolapse, history of DVT and other comorbidities who presented to the ED with worsening shortness of breath and cough.  She had been traveling several days and developed a nonproductive cough as well as subjective fever and noticed wheezing in her lungs.  She went to the urgent care was found to be hypoxic so she is in the ED for further evaluation assessment.  On arrival to the ED she was afebrile and hemodynamic stable and had to be placed on 2 L supplemental oxygen where her O2 saturations were then around 90%.  Chest x-ray showed no acute findings and she was given nebulizers and when a ambulatory home O2 screen was done and resulted in persistent hypoxia so she was admitted for further workup.  Pulmonary's been consulted for further evaluation recommendations and adjustments have been made.  Assessment and Plan:  Acute Hypoxic Respiratory Failure most likely secondary to Interstitial Lung Disease -Patient presents with wheezing -Her CXR was done and showed "Patient is slightly rotated. The heart size and mediastinal contours are stable. Left-sided implanted cardiac device remains in place. No focal airspace consolidation, pleural effusion, or pneumothorax. Exaggerated thoracic kyphosis." -CT Chest w/o Contrast "Cardiomegaly, with prominent biatrial dilatation.4.7 cm ascending thoracic aortic aneurysm. Progressive subpleural pulmonary fibrotic changes, with biapical ground-glass opacities consistent with superimposed alveolitis. Slight progression of the bilateral cylindrical bronchiectasis seen previously. Trace bilateral pleural  effusions. Aortic Atherosclerosis." -Patient has a Known history of COPD and prior CT's with fibrosis -Pulmonary Consulted and recommending continuing O2 for a Supplemental Goal > 88% -SpO2: 96 % O2 Flow Rate (L/min): 3 L/min -Continuous pulse oximetry and maintain O2 saturation greater than 88% -Continue supplemental oxygen via nasal cannula wean O2 as tolerated -Per pulmonary she will need extended prednisone taper and recommending 50 mg daily for 7 days and decreasing by 10 mg weekly -Will continue with DuoNebs twice daily and Pulmicort and Brovana -Will need pulmonary hygiene including incentive spirometer and out of bed was tolerated -Per pulmonary recommend continue Amio for now but will need to discuss with cardiology about ongoing use of ILD demonstrates progression on her outpatient PFTs -C/w Azithromycin 500 mg po qHS   Paroxysmal A-Fib -Continue Amiodarone 100 mg po at bedtime for now as Pulmonary feels Symptoms are from a ILD Flare -C/w Anticoagulation with Apixaban 5 mg po BID -Continue to Monitor on Telemetry   Hypothyroidism -Continue Levothyroxine 125 mcg po Daily    History of Breast Cancer -Continue to monitor   Essential Hypertension -Continue Losartan 50 mg po daily and Metoprolol Succinate 25 mg po at bedtime -Continue to Monitor BP per Protocol  -Last BP reading was    Urinary Retention -Continue Mirabegron ER 25 mg po at bedtime  Hyperbilirubinemia -Bilirubin Trend: Recent Labs  Lab 12/09/23 1727  BILITOT 1.2*  -Continue to Monitor and Trend and repeat CMP in the AM   Metabolic Acidosis -Mild. Patient has a CO2 of 21, AG 10, and Chloride Level of 101 -Continue to Monitor and Trend and repeat CMP in the AM    Hypokalemia -Patient's K+ Level Trend: Recent Labs  Lab 12/09/23 1727 12/10/23 0539  K 3.2* 4.9  -Replete with po KCL  40 mEQ x1 -Continue to Monitor and Replete as Necessary -Repeat CMP in the AM   Normocytic Anemia -Hgb/Hct  Trend: Recent Labs  Lab 12/09/23 1727 12/10/23 0539  HGB 12.8 11.7*  HCT 39.4 35.7*  MCV 94.7 93.0  -Check Anemia Panel in the AM -Continue to Monitor for S/Sx of Bleeding; No overt bleeding noted -Repeat CBC in the AM   Thrombocytopenia -Platelet Count Trend: Recent Labs  Lab 12/09/23 1727 12/10/23 0539  PLT 159 148*  -Continue to Monitor for S/Sx of Bleeding; No over bleeding noted -Repeat CBC in the AM   GERD/GI Prophylaxis -C/w Pantoprazole 20 mg po Daily     DVT prophylaxis: SCDs Start: 12/09/23 1927 apixaban (ELIQUIS) tablet 5 mg    Code Status: Full Code Family Communication: Discussed with Family present at bedside  Disposition Plan:  Level of care: Telemetry Status is: Inpatient Remains inpatient appropriate because: Needs further clinical improvement and clearance by Pulmonary    Consultants:  Pulmonary   Procedures:  As delineated as above   Antimicrobials:  Anti-infectives (From admission, onward)    Start     Dose/Rate Route Frequency Ordered Stop   12/09/23 2200  azithromycin (ZITHROMAX) tablet 500 mg        500 mg Oral Daily at bedtime 12/09/23 2104         Subjective: Seen and examined at bedside and was walking around with the mobility specialist and continue to feel little short of breath.  Feels okay and denies any chest pain.  No other concerns or complaints at this time.  Objective: Vitals:   12/10/23 0557 12/10/23 0810 12/10/23 0954 12/10/23 1208  BP: 135/81  136/72 115/61  Pulse: 72  72 70  Resp: 20  16 17   Temp: 97.8 F (36.6 C)  98.4 F (36.9 C) 97.7 F (36.5 C)  TempSrc:    Oral  SpO2: 98% 96% 97% 96%    Intake/Output Summary (Last 24 hours) at 12/10/2023 1753 Last data filed at 12/09/2023 2135 Gross per 24 hour  Intake 670 ml  Output --  Net 670 ml   There were no vitals filed for this visit.  Examination: Physical Exam:  Constitutional: WN/WD elderly chronically ill-appearing Caucasian female no acute  distress Respiratory: Diminished to auscultation bilaterally with some coarse breath sounds and some slight wheezing but no appreciable rales.  Has some rhonchi but no crackles. Normal respiratory effort and patient is not tachypenic. No accessory muscle use.  Wearing supplemental oxygen nasal cannula Cardiovascular: RRR, no murmurs / rubs / gallops. S1 and S2 auscultated. No extremity edema. Abdomen: Soft, non-tender, non-distended. Bowel sounds positive.  GU: Deferred. Musculoskeletal: No clubbing / cyanosis of digits/nails. Good ROM, no contractures. Normal strength and muscle tone.  Skin: No rashes, lesions, ulcers limited skin evaluation. No induration; Warm and dry.  Neurologic: CN 2-12 grossly intact with no focal deficits. Romberg sign and cerebellar reflexes not assessed.  Psychiatric: Normal judgment and insight. Alert and oriented x 3. Normal mood and appropriate affect.   Data Reviewed: I have personally reviewed following labs and imaging studies  CBC: Recent Labs  Lab 12/09/23 1727 12/10/23 0539  WBC 8.3 6.5  NEUTROABS 6.4  --   HGB 12.8 11.7*  HCT 39.4 35.7*  MCV 94.7 93.0  PLT 159 148*   Basic Metabolic Panel: Recent Labs  Lab 12/09/23 1727 12/10/23 0539  NA 134* 132*  K 3.2* 4.9  CL 100 101  CO2 23 21*  GLUCOSE 146* 151*  BUN 16 17  CREATININE 0.66 0.75  CALCIUM 8.2* 8.5*   GFR: CrCl cannot be calculated (Unknown ideal weight.). Liver Function Tests: Recent Labs  Lab 12/09/23 1727  AST 24  ALT 13  ALKPHOS 92  BILITOT 1.2*  PROT 7.7  ALBUMIN 3.8   No results for input(s): "LIPASE", "AMYLASE" in the last 168 hours. No results for input(s): "AMMONIA" in the last 168 hours. Coagulation Profile: No results for input(s): "INR", "PROTIME" in the last 168 hours. Cardiac Enzymes: No results for input(s): "CKTOTAL", "CKMB", "CKMBINDEX", "TROPONINI" in the last 168 hours. BNP (last 3 results) No results for input(s): "PROBNP" in the last 8760  hours. HbA1C: No results for input(s): "HGBA1C" in the last 72 hours. CBG: No results for input(s): "GLUCAP" in the last 168 hours. Lipid Profile: No results for input(s): "CHOL", "HDL", "LDLCALC", "TRIG", "CHOLHDL", "LDLDIRECT" in the last 72 hours. Thyroid Function Tests: No results for input(s): "TSH", "T4TOTAL", "FREET4", "T3FREE", "THYROIDAB" in the last 72 hours. Anemia Panel: No results for input(s): "VITAMINB12", "FOLATE", "FERRITIN", "TIBC", "IRON", "RETICCTPCT" in the last 72 hours. Sepsis Labs: No results for input(s): "PROCALCITON", "LATICACIDVEN" in the last 168 hours.  Recent Results (from the past 240 hours)  Resp panel by RT-PCR (RSV, Flu A&B, Covid) Anterior Nasal Swab     Status: None   Collection Time: 12/09/23  5:38 PM   Specimen: Anterior Nasal Swab  Result Value Ref Range Status   SARS Coronavirus 2 by RT PCR NEGATIVE NEGATIVE Final    Comment: (NOTE) SARS-CoV-2 target nucleic acids are NOT DETECTED.  The SARS-CoV-2 RNA is generally detectable in upper respiratory specimens during the acute phase of infection. The lowest concentration of SARS-CoV-2 viral copies this assay can detect is 138 copies/mL. A negative result does not preclude SARS-Cov-2 infection and should not be used as the sole basis for treatment or other patient management decisions. A negative result may occur with  improper specimen collection/handling, submission of specimen other than nasopharyngeal swab, presence of viral mutation(s) within the areas targeted by this assay, and inadequate number of viral copies(<138 copies/mL). A negative result must be combined with clinical observations, patient history, and epidemiological information. The expected result is Negative.  Fact Sheet for Patients:  BloggerCourse.com  Fact Sheet for Healthcare Providers:  SeriousBroker.it  This test is no t yet approved or cleared by the [place]  FDA and  has been authorized for detection and/or diagnosis of SARS-CoV-2 by FDA under an Emergency Use Authorization (EUA). This EUA will remain  in effect (meaning this test can be used) for the duration of the COVID-19 declaration under Section 564(b)(1) of the Act, 21 U.S.C.section 360bbb-3(b)(1), unless the authorization is terminated  or revoked sooner.       Influenza A by PCR NEGATIVE NEGATIVE Final   Influenza B by PCR NEGATIVE NEGATIVE Final    Comment: (NOTE) The Xpert Xpress SARS-CoV-2/FLU/RSV plus assay is intended as an aid in the diagnosis of influenza from Nasopharyngeal swab specimens and should not be used as a sole basis for treatment. Nasal washings and aspirates are unacceptable for Xpert Xpress SARS-CoV-2/FLU/RSV testing.  Fact Sheet for Patients: BloggerCourse.com  Fact Sheet for Healthcare Providers: SeriousBroker.it  This test is not yet approved or cleared by the [place] FDA and has been authorized for detection and/or diagnosis of SARS-CoV-2 by FDA under an Emergency Use Authorization (EUA). This EUA will remain in effect (meaning this test can be used) for the duration of the COVID-19 declaration under  Section 564(b)(1) of the Act, 21 U.S.C. section 360bbb-3(b)(1), unless the authorization is terminated or revoked.     Resp Syncytial Virus by PCR NEGATIVE NEGATIVE Final    Comment: (NOTE) Fact Sheet for Patients: BloggerCourse.com  Fact Sheet for Healthcare Providers: SeriousBroker.it  This test is not yet approved or cleared by the [place] FDA and has been authorized for detection and/or diagnosis of SARS-CoV-2 by FDA under an Emergency Use Authorization (EUA). This EUA will remain in effect (meaning this test can be used) for the duration of the COVID-19 declaration under Section 564(b)(1) of the Act, 21 U.S.C. section  360bbb-3(b)(1), unless the authorization is terminated or revoked.  Performed at [facility], [address], [place], [place] [address]     Radiology Studies: CT CHEST WO CONTRAST Result Date: 12/09/2023 CLINICAL DATA:  Short of breath, difficulty breathing, wheezing for 1 week EXAM: CT CHEST WITHOUT CONTRAST TECHNIQUE: Multidetector CT imaging of the chest was performed following the standard protocol without IV contrast. RADIATION DOSE REDUCTION: This exam was performed according to the departmental dose-optimization program which includes automated exposure control, adjustment of the mA and/or kV according to patient size and/or use of iterative reconstruction technique. COMPARISON:  12/09/2023, 02/10/2018 FINDINGS: Cardiovascular: The heart is enlarged, with prominent biatrial dilatation. No pericardial effusion. 4.7 cm ascending thoracic aortic aneurysm. Evaluation of the vascular lumen is limited without IV contrast. Atherosclerosis of the thoracic aorta. Dual lead pacer, proximal lead in the right atrium and distal lead in the right ventricle. Mediastinum/Nodes: No enlarged mediastinal or axillary lymph nodes. Thyroid gland, trachea, and esophagus demonstrate no significant findings. Lungs/Pleura: Trace bilateral pleural effusions. Since the prior exam, there is progressive subpleural scarring and interlobular septal thickening, slightly more pronounced within the upper lobes. Scattered areas of ground-glass opacity are seen at the lung apices, new since prior exam. There is mild bilateral cylindrical bronchiectasis, which has progressed slightly in the interim. No other areas of airspace disease. No pneumothorax. The central airways are patent. Upper Abdomen: No acute abnormality. Musculoskeletal: There are no acute or destructive bony abnormalities. Postsurgical changes are seen within the left breast. Reconstructed images demonstrate no additional findings. IMPRESSION: 1.  Cardiomegaly, with prominent biatrial dilatation. 2. 4.7 cm ascending thoracic aortic aneurysm. Evaluation of the vascular lumen is limited without IV contrast. Recommend semi-annual imaging followup by CTA or MRA and referral to cardiothoracic surgery if not already obtained. This recommendation follows 2010 ACCF/AHA/AATS/ACR/ASA/SCA/SCAI/SIR/STS/SVM Guidelines for the Diagnosis and Management of Patients With Thoracic Aortic Disease. Circulation. 2010; 121: U045-W098. Aortic aneurysm NOS (ICD10-I71.9) 3. Progressive subpleural pulmonary fibrotic changes, with biapical ground-glass opacities consistent with superimposed alveolitis. Slight progression of the bilateral cylindrical bronchiectasis seen previously. 4. Trace bilateral pleural effusions. 5.  Aortic Atherosclerosis (ICD10-I70.0). Electronically Signed   By: Sharlet Salina M.D.   On: 12/09/2023 20:04   DG Chest 2 View Result Date: 12/09/2023 CLINICAL DATA:  Shortness of breath EXAM: CHEST - 2 VIEW COMPARISON:  10/18/2022 FINDINGS: Patient is slightly rotated. The heart size and mediastinal contours are stable. Left-sided implanted cardiac device remains in place. No focal airspace consolidation, pleural effusion, or pneumothorax. Exaggerated thoracic kyphosis. IMPRESSION: No active cardiopulmonary disease. Electronically Signed   By: Duanne Guess D.O.   On: 12/09/2023 17:59   Scheduled Meds:  amiodarone  100 mg Oral QHS   apixaban  5 mg Oral BID   arformoterol  15 mcg Nebulization BID   azithromycin  500 mg Oral QHS   budesonide (PULMICORT) nebulizer  solution  0.25 mg Nebulization BID   ipratropium-albuterol  3 mL Nebulization BID   levothyroxine  125 mcg Oral QAC breakfast   losartan  50 mg Oral Daily   metoprolol succinate  25 mg Oral QHS   mirabegron ER  25 mg Oral QHS   pantoprazole  20 mg Oral Daily   predniSONE  50 mg Oral Q breakfast   Continuous Infusions:   LOS: 1 day   Marguerita Merles, DO [facility] Available via  Epic secure chat 7am-7pm After these hours, please refer to coverage provider listed on [email or web address] 12/10/2023, 5:53 PM

## 2023-12-10 NOTE — Plan of Care (Signed)

## 2023-12-10 NOTE — Progress Notes (Signed)
Mobility Specialist - Progress Note   12/10/23 1100  Mobility  Activity Ambulated with assistance in hallway  Level of Assistance Standby assist, set-up cues, supervision of patient - no hands on  Assistive Device Front wheel walker  Distance Ambulated (ft) 240 ft  Range of Motion/Exercises Active  Activity Response Tolerated well  Mobility Referral Yes  Mobility visit 1 Mobility  Mobility Specialist Start Time (ACUTE ONLY) 1104  Mobility Specialist Stop Time (ACUTE ONLY) 1120  Mobility Specialist Time Calculation (min) (ACUTE ONLY) 16 min   Received in bed and agreed to mobility.   On 3L throughout session, pt had no c/o anything throughout session. Returned to chair with all needs met, family in room.  Marilynne Halsted Mobility Specialist

## 2023-12-10 NOTE — Plan of Care (Signed)
  Problem: Coping: Goal: Level of anxiety will decrease Outcome: Progressing   Problem: Elimination: Goal: Will not experience complications related to urinary retention Outcome: Progressing   Problem: Pain Management: Goal: General experience of comfort will improve Outcome: Progressing   Problem: Safety: Goal: Ability to remain free from injury will improve Outcome: Progressing   Problem: Skin Integrity: Goal: Risk for impaired skin integrity will decrease Outcome: Progressing

## 2023-12-11 ENCOUNTER — Inpatient Hospital Stay (HOSPITAL_COMMUNITY): Payer: Medicare Other

## 2023-12-11 DIAGNOSIS — J849 Interstitial pulmonary disease, unspecified: Secondary | ICD-10-CM | POA: Diagnosis not present

## 2023-12-11 DIAGNOSIS — J9601 Acute respiratory failure with hypoxia: Secondary | ICD-10-CM | POA: Diagnosis not present

## 2023-12-11 LAB — CBC WITH DIFFERENTIAL/PLATELET
Abs Immature Granulocytes: 0.29 10*3/uL — ABNORMAL HIGH (ref 0.00–0.07)
Basophils Absolute: 0 10*3/uL (ref 0.0–0.1)
Basophils Relative: 0 %
Eosinophils Absolute: 0 10*3/uL (ref 0.0–0.5)
Eosinophils Relative: 0 %
HCT: 38.4 % (ref 36.0–46.0)
Hemoglobin: 12.4 g/dL (ref 12.0–15.0)
Immature Granulocytes: 2 %
Lymphocytes Relative: 5 %
Lymphs Abs: 0.6 10*3/uL — ABNORMAL LOW (ref 0.7–4.0)
MCH: 30.3 pg (ref 26.0–34.0)
MCHC: 32.3 g/dL (ref 30.0–36.0)
MCV: 93.9 fL (ref 80.0–100.0)
Monocytes Absolute: 0.7 10*3/uL (ref 0.1–1.0)
Monocytes Relative: 5 %
Neutro Abs: 11.9 10*3/uL — ABNORMAL HIGH (ref 1.7–7.7)
Neutrophils Relative %: 88 %
Platelets: 169 10*3/uL (ref 150–400)
RBC: 4.09 MIL/uL (ref 3.87–5.11)
RDW: 15.4 % (ref 11.5–15.5)
WBC: 13.5 10*3/uL — ABNORMAL HIGH (ref 4.0–10.5)
nRBC: 0 % (ref 0.0–0.2)

## 2023-12-11 LAB — COMPREHENSIVE METABOLIC PANEL
ALT: 13 U/L (ref 0–44)
AST: 22 U/L (ref 15–41)
Albumin: 3.3 g/dL — ABNORMAL LOW (ref 3.5–5.0)
Alkaline Phosphatase: 81 U/L (ref 38–126)
Anion gap: 6 (ref 5–15)
BUN: 25 mg/dL — ABNORMAL HIGH (ref 8–23)
CO2: 27 mmol/L (ref 22–32)
Calcium: 8.4 mg/dL — ABNORMAL LOW (ref 8.9–10.3)
Chloride: 98 mmol/L (ref 98–111)
Creatinine, Ser: 0.78 mg/dL (ref 0.44–1.00)
GFR, Estimated: >60 mL/min (ref >60)
Glucose, Bld: 120 mg/dL — ABNORMAL HIGH (ref 70–99)
Potassium: 4.4 mmol/L (ref 3.5–5.1)
Sodium: 131 mmol/L — ABNORMAL LOW (ref 135–145)
Total Bilirubin: 0.4 mg/dL (ref <1.2)
Total Protein: 7.2 g/dL (ref 6.5–8.1)

## 2023-12-11 LAB — MAGNESIUM: Magnesium: 2.3 mg/dL (ref 1.7–2.4)

## 2023-12-11 LAB — PHOSPHORUS: Phosphorus: 2.7 mg/dL (ref 2.5–4.6)

## 2023-12-11 MED ORDER — MOMETASONE FURO-FORMOTEROL FUM 200-5 MCG/ACT IN AERO
2.0000 | INHALATION_SPRAY | Freq: Two times a day (BID) | RESPIRATORY_TRACT | Status: DC
  Administered 2023-12-11 – 2023-12-12 (×2): 2 via RESPIRATORY_TRACT
  Filled 2023-12-11: qty 8.8

## 2023-12-11 MED ORDER — GUAIFENESIN-DM 100-10 MG/5ML PO SYRP
5.0000 mL | ORAL_SOLUTION | ORAL | Status: DC | PRN
  Administered 2023-12-11: 5 mL via ORAL
  Filled 2023-12-11: qty 5

## 2023-12-11 MED ORDER — AEROCHAMBER PLUS FLO-VU MISC
1.0000 | Freq: Once | Status: DC
  Filled 2023-12-11: qty 1

## 2023-12-11 MED ORDER — BUDESONIDE 0.25 MG/2ML IN SUSP
0.2500 mg | Freq: Every day | RESPIRATORY_TRACT | Status: DC
  Administered 2023-12-12: 0.25 mg via RESPIRATORY_TRACT
  Filled 2023-12-11: qty 2

## 2023-12-11 MED ORDER — TRIAMCINOLONE ACETONIDE 0.1 % EX CREA
TOPICAL_CREAM | CUTANEOUS | Status: AC
  Filled 2023-12-11: qty 15

## 2023-12-11 NOTE — Progress Notes (Signed)
PROGRESS NOTE    [name]  ZDG:[id number] DOB: [birth year] DOA: 12/09/2023 PCP: Thana Ates, MD   Brief Narrative:  The patient is an 89 year old Caucasian female with a past medical history significant for Manson Passey to breast cancer, hypertension, hypothyroidism, paroxysmal atrial fibrillation on anticoagulation, history of ILD, chronic diastolic CHF, mitral valve prolapse, history of DVT and other comorbidities who presented to the ED with worsening shortness of breath and cough.  She had been traveling several days and developed a nonproductive cough as well as subjective fever and noticed wheezing in her lungs.  She went to the urgent care was found to be hypoxic so she is in the ED for further evaluation assessment.  On arrival to the ED she was afebrile and hemodynamic stable and had to be placed on 2 L supplemental oxygen where her O2 saturations were then around 90%.  Chest x-ray showed no acute findings and she was given nebulizers and when a ambulatory home O2 screen was done and resulted in persistent hypoxia so she was admitted for further workup.  Pulmonary's been consulted for further evaluation recommendations and adjustments have been made. She is being transitioned to [facility] today and anticipating D/C in the next 24 hours.  Assessment and Plan:  Acute Hypoxic Respiratory Failure most likely secondary to Interstitial Lung Disease -Patient presents with wheezing -Her CXR was done and showed "Patient is slightly rotated. The heart size and mediastinal contours are stable. Left-sided implanted cardiac device remains in place. No focal airspace consolidation, pleural effusion, or pneumothorax. Exaggerated thoracic kyphosis." -CT Chest w/o Contrast "Cardiomegaly, with prominent biatrial dilatation.4.7 cm ascending thoracic aortic aneurysm. Progressive subpleural pulmonary fibrotic changes, with biapical ground-glass opacities consistent with superimposed alveolitis. Slight progression of  the bilateral cylindrical bronchiectasis seen previously. Trace bilateral pleural effusions. Aortic Atherosclerosis." -Patient has a Known history of COPD and prior CT's with fibrosis -Pulmonary Consulted and recommending continuing O2 for a Supplemental Goal > 88% -SpO2: 99 % O2 Flow Rate (L/min): 3 L/min FiO2 (%): 32 %; Continues to Feel SOB -Continuous pulse oximetry and maintain O2 saturation greater than 88% -Continue supplemental oxygen via nasal cannula wean O2 as tolerated -Per pulmonary she will need extended prednisone taper and recommending 50 mg daily for 7 days and decreasing by 10 mg weekly -Was initiated on with DuoNebs twice daily and Pulmicort and Brovana pulmonary is decreasing her Pulmicort to daily and discontinuing her Brovana and initiating her on Dulera 200/5 mcg with plan to transition to Symbicort as an outpatient -Initiated on flutter valve and incentive spirometry -Will need pulmonary hygiene including incentive spirometer and out of bed was tolerated -Per pulmonary recommend continue Amio for now but will need to discuss with cardiology about ongoing use of ILD demonstrates progression on her outpatient PFTs -Patient will likely have a outpatient pulmonary rehab referral when she is seen in clinic -C/w Azithromycin 500 mg po at bedtime -CXR done today and showed "Mild vascular congestion. Left base consolidation or volume loss and effusion." -Will check ambulatory home O2 screen and repeat CXR in the AM   Paroxysmal A-Fib -Continue Amiodarone 100 mg po at bedtime for now as Pulmonary feels Symptoms are from a ILD Flare -C/w Anticoagulation with Apixaban 5 mg po BID -Continue to Monitor on Telemetry   Hypothyroidism -Continue Levothyroxine 125 mcg po Daily    History of Breast Cancer -Continue to Monitor and will need to follow up in the outpatient setting   Essential Hypertension -Continue Losartan 50 mg po  daily and Metoprolol Succinate 25 mg po at  bedtime -Continue to Monitor BP per Protocol  -Last BP reading was 146/86   Urinary Retention -Continue Mirabegron ER 25 mg po at bedtime  Hyperbilirubinemia, improved -Bilirubin Trend: Recent Labs  Lab 12/09/23 1727 12/11/23 0451  BILITOT 1.2* 0.4  -Continue to Monitor and Trend and repeat CMP in the AM   Metabolic Acidosis -Mild and Improved. Patient now has a CO2 of 27, anion gap of 6, Chloride Level of 98 -Continue to Monitor and Trend and repeat CMP in the AM    Hypokalemia -Patient's K+ Level Trend: Recent Labs  Lab 12/09/23 1727 12/10/23 0539 12/11/23 0451  K 3.2* 4.9 4.4  -Continue to Monitor and Replete as Necessary -Repeat CMP in the AM   Leukocytosis -In the setting of Steroid Demargination -WBC Trend:  Recent Labs  Lab 12/09/23 1727 12/10/23 0539 12/11/23 0451  WBC 8.3 6.5 13.5*  -Continue to Monitor and Trend repeat CBC in the AM  Normocytic Anemia -Hgb/Hct Trend: Recent Labs  Lab 12/09/23 1727 12/10/23 0539 12/11/23 0451  HGB 12.8 11.7* 12.4  HCT 39.4 35.7* 38.4  MCV 94.7 93.0 93.9  -Check Anemia Panel in the AM -Continue to Monitor for S/Sx of Bleeding; No overt bleeding noted -Repeat CBC in the AM   Thrombocytopenia, improved  -Platelet Count Trend: Recent Labs  Lab 12/09/23 1727 12/10/23 0539 12/11/23 0451  PLT 159 148* 169  -Continue to Monitor for S/Sx of Bleeding; No over bleeding noted -Repeat CBC in the AM   GERD/GI Prophylaxis -C/w Pantoprazole 20 mg po Daily   Right Leg Wound -WOC Nurse consultation  Hypoalbuminemia -Patient's Albumin Trend: Recent Labs  Lab 12/09/23 1727 12/11/23 0451  ALBUMIN 3.8 3.3*  -Continue to Monitor and Trend and repeat CMP in the AM    DVT prophylaxis: SCDs Start: 12/09/23 1927 apixaban (ELIQUIS) tablet 5 mg    Code Status: Full Code Family Communication: Discussed with Husband and Daughter at bedside  Disposition Plan:  Level of care: Telemetry Status is: Inpatient Remains  inpatient appropriate because: Needs further clinical improvement and clearance by Pulmonary   Consultants:  Pulmonary   Procedures:  As delineated as above  Antimicrobials:  Anti-infectives (From admission, onward)    Start     Dose/Rate Route Frequency Ordered Stop   12/09/23 2200  azithromycin (ZITHROMAX) tablet 500 mg        500 mg Oral Daily at bedtime 12/09/23 2104         Subjective: Seen and examined at bedside and was feeling about the same and fatigued and states that she did not sleep very well at all last night.  Continues to feel short breath.  No nausea or vomiting.  No other concerns or complaints this time.  Objective: Vitals:   12/10/23 2112 12/11/23 0450 12/11/23 0814 12/11/23 1221  BP: 130/69 (!) 160/100  (!) 146/86  Pulse: 71 72  70  Resp:  18  20  Temp:  98 F (36.7 C)  98.1 F (36.7 C)  TempSrc:      SpO2:  97% 96% 99%    Intake/Output Summary (Last 24 hours) at 12/11/2023 1449 Last data filed at 12/11/2023 1300 Gross per 24 hour  Intake 840 ml  Output 1100 ml  Net -260 ml   There were no vitals filed for this visit.  Examination: Physical Exam:  Constitutional: Elderly Caucasian female who appears little fatigued and mildly short of breath Respiratory: Diminished to auscultation bilaterally with  coarse breath sounds, no wheezing, rales, rhonchi or crackles. Normal respiratory effort and patient is not tachypenic. No accessory muscle use.  Unlabored breathing wearing supplemental oxygen via nasal cannula Cardiovascular: RRR, no murmurs / rubs / gallops. S1 and S2 auscultated. No extremity edema. .  Abdomen: Soft, non-tender, mildly distended. Bowel sounds positive.  GU: Deferred. Musculoskeletal: No clubbing / cyanosis of digits/nails. No joint deformity upper and lower extremities. Good ROM, no contractures. Normal strength and muscle tone.  Neurologic: CN 2-12 grossly intact with no focal deficits. Romberg sign cerebellar reflexes not  assessed.  Psychiatric: Normal judgment and insight. Alert and oriented x 3. Normal mood and appropriate affect.   Data Reviewed: I have personally reviewed following labs and imaging studies  CBC: Recent Labs  Lab 12/09/23 1727 12/10/23 0539 12/11/23 0451  WBC 8.3 6.5 13.5*  NEUTROABS 6.4  --  11.9*  HGB 12.8 11.7* 12.4  HCT 39.4 35.7* 38.4  MCV 94.7 93.0 93.9  PLT 159 148* 169   Basic Metabolic Panel: Recent Labs  Lab 12/09/23 1727 12/10/23 0539 12/11/23 0451  NA 134* 132* 131*  K 3.2* 4.9 4.4  CL 100 101 98  CO2 23 21* 27  GLUCOSE 146* 151* 120*  BUN 16 17 25*  CREATININE 0.66 0.75 0.78  CALCIUM 8.2* 8.5* 8.4*  MG  --   --  2.3  PHOS  --   --  2.7   GFR: CrCl cannot be calculated (Unknown ideal weight.). Liver Function Tests: Recent Labs  Lab 12/09/23 1727 12/11/23 0451  AST 24 22  ALT 13 13  ALKPHOS 92 81  BILITOT 1.2* 0.4  PROT 7.7 7.2  ALBUMIN 3.8 3.3*   No results for input(s): "LIPASE", "AMYLASE" in the last 168 hours. No results for input(s): "AMMONIA" in the last 168 hours. Coagulation Profile: No results for input(s): "INR", "PROTIME" in the last 168 hours. Cardiac Enzymes: No results for input(s): "CKTOTAL", "CKMB", "CKMBINDEX", "TROPONINI" in the last 168 hours. BNP (last 3 results) No results for input(s): "PROBNP" in the last 8760 hours. HbA1C: No results for input(s): "HGBA1C" in the last 72 hours. CBG: No results for input(s): "GLUCAP" in the last 168 hours. Lipid Profile: No results for input(s): "CHOL", "HDL", "LDLCALC", "TRIG", "CHOLHDL", "LDLDIRECT" in the last 72 hours. Thyroid Function Tests: No results for input(s): "TSH", "T4TOTAL", "FREET4", "T3FREE", "THYROIDAB" in the last 72 hours. Anemia Panel: No results for input(s): "VITAMINB12", "FOLATE", "FERRITIN", "TIBC", "IRON", "RETICCTPCT" in the last 72 hours. Sepsis Labs: No results for input(s): "PROCALCITON", "LATICACIDVEN" in the last 168 hours.  Recent Results (from  the past 240 hours)  Resp panel by RT-PCR (RSV, Flu A&B, Covid) Anterior Nasal Swab     Status: None   Collection Time: 12/09/23  5:38 PM   Specimen: Anterior Nasal Swab  Result Value Ref Range Status   SARS Coronavirus 2 by RT PCR NEGATIVE NEGATIVE Final    Comment: (NOTE) SARS-CoV-2 target nucleic acids are NOT DETECTED.  The SARS-CoV-2 RNA is generally detectable in upper respiratory specimens during the acute phase of infection. The lowest concentration of SARS-CoV-2 viral copies this assay can detect is 138 copies/mL. A negative result does not preclude SARS-Cov-2 infection and should not be used as the sole basis for treatment or other patient management decisions. A negative result may occur with  improper specimen collection/handling, submission of specimen other than nasopharyngeal swab, presence of viral mutation(s) within the areas targeted by this assay, and inadequate number of viral copies(<138 copies/mL).  A negative result must be combined with clinical observations, patient history, and epidemiological information. The expected result is Negative.  Fact Sheet for Patients:  BloggerCourse.com  Fact Sheet for Healthcare Providers:  SeriousBroker.it  This test is no t yet approved or cleared by the [place] FDA and  has been authorized for detection and/or diagnosis of SARS-CoV-2 by FDA under an Emergency Use Authorization (EUA). This EUA will remain  in effect (meaning this test can be used) for the duration of the COVID-19 declaration under Section 564(b)(1) of the Act, 21 U.S.C.section 360bbb-3(b)(1), unless the authorization is terminated  or revoked sooner.       Influenza A by PCR NEGATIVE NEGATIVE Final   Influenza B by PCR NEGATIVE NEGATIVE Final    Comment: (NOTE) The Xpert Xpress SARS-CoV-2/FLU/RSV plus assay is intended as an aid in the diagnosis of influenza from Nasopharyngeal swab specimens  and should not be used as a sole basis for treatment. Nasal washings and aspirates are unacceptable for Xpert Xpress SARS-CoV-2/FLU/RSV testing.  Fact Sheet for Patients: BloggerCourse.com  Fact Sheet for Healthcare Providers: SeriousBroker.it  This test is not yet approved or cleared by the [place] FDA and has been authorized for detection and/or diagnosis of SARS-CoV-2 by FDA under an Emergency Use Authorization (EUA). This EUA will remain in effect (meaning this test can be used) for the duration of the COVID-19 declaration under Section 564(b)(1) of the Act, 21 U.S.C. section 360bbb-3(b)(1), unless the authorization is terminated or revoked.     Resp Syncytial Virus by PCR NEGATIVE NEGATIVE Final    Comment: (NOTE) Fact Sheet for Patients: BloggerCourse.com  Fact Sheet for Healthcare Providers: SeriousBroker.it  This test is not yet approved or cleared by the [place] FDA and has been authorized for detection and/or diagnosis of SARS-CoV-2 by FDA under an Emergency Use Authorization (EUA). This EUA will remain in effect (meaning this test can be used) for the duration of the COVID-19 declaration under Section 564(b)(1) of the Act, 21 U.S.C. section 360bbb-3(b)(1), unless the authorization is terminated or revoked.  Performed at [facility], [address] [place], [place] [address]    Radiology Studies: DG CHEST PORT 1 VIEW Result Date: 12/11/2023 CLINICAL DATA:  SOB EXAM: PORTABLE CHEST 1 VIEW COMPARISON:  12/09/2023. FINDINGS: Cardiac silhouette obscured. Left base alveolar opacity and effusion. Pulmonary interstitial changes consistent with edema. Left-sided pacer. Calcified aorta. No pneumothorax. IMPRESSION: Mild vascular congestion. Left base consolidation or volume loss and effusion. Electronically Signed   By: Layla Maw M.D.    On: 12/11/2023 09:09   CT CHEST WO CONTRAST Result Date: 12/09/2023 CLINICAL DATA:  Short of breath, difficulty breathing, wheezing for 1 week EXAM: CT CHEST WITHOUT CONTRAST TECHNIQUE: Multidetector CT imaging of the chest was performed following the standard protocol without IV contrast. RADIATION DOSE REDUCTION: This exam was performed according to the departmental dose-optimization program which includes automated exposure control, adjustment of the mA and/or kV according to patient size and/or use of iterative reconstruction technique. COMPARISON:  12/09/2023, 02/10/2018 FINDINGS: Cardiovascular: The heart is enlarged, with prominent biatrial dilatation. No pericardial effusion. 4.7 cm ascending thoracic aortic aneurysm. Evaluation of the vascular lumen is limited without IV contrast. Atherosclerosis of the thoracic aorta. Dual lead pacer, proximal lead in the right atrium and distal lead in the right ventricle. Mediastinum/Nodes: No enlarged mediastinal or axillary lymph nodes. Thyroid gland, trachea, and esophagus demonstrate no significant findings. Lungs/Pleura: Trace bilateral pleural effusions. Since the prior exam, there is  progressive subpleural scarring and interlobular septal thickening, slightly more pronounced within the upper lobes. Scattered areas of ground-glass opacity are seen at the lung apices, new since prior exam. There is mild bilateral cylindrical bronchiectasis, which has progressed slightly in the interim. No other areas of airspace disease. No pneumothorax. The central airways are patent. Upper Abdomen: No acute abnormality. Musculoskeletal: There are no acute or destructive bony abnormalities. Postsurgical changes are seen within the left breast. Reconstructed images demonstrate no additional findings. IMPRESSION: 1. Cardiomegaly, with prominent biatrial dilatation. 2. 4.7 cm ascending thoracic aortic aneurysm. Evaluation of the vascular lumen is limited without IV contrast.  Recommend semi-annual imaging followup by CTA or MRA and referral to cardiothoracic surgery if not already obtained. This recommendation follows 2010 ACCF/AHA/AATS/ACR/ASA/SCA/SCAI/SIR/STS/SVM Guidelines for the Diagnosis and Management of Patients With Thoracic Aortic Disease. Circulation. 2010; 121: V409-W119. Aortic aneurysm NOS (ICD10-I71.9) 3. Progressive subpleural pulmonary fibrotic changes, with biapical ground-glass opacities consistent with superimposed alveolitis. Slight progression of the bilateral cylindrical bronchiectasis seen previously. 4. Trace bilateral pleural effusions. 5.  Aortic Atherosclerosis (ICD10-I70.0). Electronically Signed   By: Sharlet Salina M.D.   On: 12/09/2023 20:04   DG Chest 2 View Result Date: 12/09/2023 CLINICAL DATA:  Shortness of breath EXAM: CHEST - 2 VIEW COMPARISON:  10/18/2022 FINDINGS: Patient is slightly rotated. The heart size and mediastinal contours are stable. Left-sided implanted cardiac device remains in place. No focal airspace consolidation, pleural effusion, or pneumothorax. Exaggerated thoracic kyphosis. IMPRESSION: No active cardiopulmonary disease. Electronically Signed   By: Duanne Guess D.O.   On: 12/09/2023 17:59   Scheduled Meds:  Aerochamber Plus  1 each Other Once   amiodarone  100 mg Oral QHS   apixaban  5 mg Oral BID   azithromycin  500 mg Oral QHS   [START ON 12/12/2023] budesonide (PULMICORT) nebulizer solution  0.25 mg Nebulization Daily   ipratropium-albuterol  3 mL Nebulization BID   levothyroxine  125 mcg Oral QAC breakfast   losartan  50 mg Oral Daily   metoprolol succinate  25 mg Oral QHS   mirabegron ER  25 mg Oral QHS   mometasone-formoterol  2 puff Inhalation BID   pantoprazole  20 mg Oral Daily   predniSONE  50 mg Oral Q breakfast   triamcinolone cream   Topical 1 day or 1 dose   Continuous Infusions:   LOS: 2 days   Marguerita Merles, DO [facility] Available via Epic secure chat 7am-7pm After these  hours, please refer to coverage provider listed on [email or web address] 12/11/2023, 2:49 PM

## 2023-12-11 NOTE — Consult Note (Signed)
NAME:  [name], MRN:  [id number], DOB:  February 02, 1933, LOS: 2 ADMISSION DATE:  12/09/2023, CONSULTATION DATE:  12/10/23   REFERRING MD:  Marguerita Merles, MD CHIEF COMPLAINT:  ILD vs amio toxicity   History of Present Illness:  89 year old female with hx ILD, atrial fib/DVT, chronic diastolic heart failure, mitral valve prolapse who presented to the ED with shortness of breath and cough. She recently traveled to [place] for 8 days and after returning had developed a nonproductive cough, wheeze, subjective fever. She went to urgent care and was found with SpO2 <88% so sent to ED. Started on 2L O2 and nebulizers. CXR negative. RVP negative. CT chest with progressive subpleural fibrosis and biapical GGO, trace bilateral pleural effusions.   She was previously following [facility] with Dr. Marchelle Gearing for NSIP and only had mild dyspnea at that time. She reports that her dyspnea has worsened in the last year and has attributed this primarily to her cardiac issues. Has not needed oxygen in the past.   Pertinent  Medical History  ILD, HTN, chronic diastolic heart failure, hx systolic heart failure s/p PM with normal EF, mitral valve prolapse, atrial fib/flutter on eliquis, SVT, hx breast cancer, hypothyroidism  Significant Hospital Events: Including procedures, antibiotic start and stop dates in addition to other pertinent events     Interim History / Subjective:  Unchanged O2 requirement but feels poorly today  Objective   Blood pressure (!) 160/100, pulse 72, temperature 98 F (36.7 C), resp. rate 18, SpO2 96%.    FiO2 (%):  [32 %] 32 %   Intake/Output Summary (Last 24 hours) at 12/11/2023 1056 Last data filed at 12/11/2023 0825 Gross per 24 hour  Intake 600 ml  Output 1100 ml  Net -500 ml   There were no vitals filed for this visit.  Physical Exam: General: Elderly-appearing, no acute distress HENT: [place], AT Eyes: EOMI, no scleral icterus Respiratory: Mild bibasilra  inspiratory crackles. Central rhonhi Cardiovascular: RRR, -M/R/G, no JVD Extremities:-Edema,-tenderness Neuro: AAO x4, CNII-XII grossly intact Psych: Normal mood, normal affect  BUN slightly increased. Normal Cr WBC 13, may be related to steroid use   Assessment & Plan:   Acute hypoxemic respiratory failure 2/2 ILD exacerbation History of NSIP Pulmonary consulted for recommendations for ILD management and evaluation for possible amiodarone toxicity. Patient reports has been on amio for 10 years however documentation comments no amio use until 2021 with cardiology. CT imaging reviewed since 2013 demonstrating mild NSIP changes prior to medication with progressive but minimal changes with subpleural and bronchiectasis. Possible that amio has been contributing but has been overall stable respiratory symptoms until the last year with progressive dyspnea. Suspect this is chronic underlying lung issues consistent with NSIP that has mildly progressed and now worsened in setting of exacerbation. Imaging not consistent with UIP or amio toxicity. Recent travel to [place] increased risk of ILD exacerbation with high altitude exposure and possible viral source - which is likely driving her symptoms and less likely acute amiodarone toxicity. Maybe mild heart failure with trace bilateral pleural effusions on imaging  Clinically stable. Counseled on oxygen, bronchodilators and exercise as primary management for her chronic lung disease  --Wean supplemental O2 for goal >88%. Will need ambulatory O2 sats to determine home O2 needs --Agree with prednisone taper. Currently on 50 mg. Recommend decreasing by 10 mg weekly --S/p lasix 40 mg with good UOP. Hold for now --Investigated outpatient ICS/LABA options prior to discharge  Symbicort 160-4.5 mcg = $  59 for 30 day supply  Wixela 250-50 mcg = $23 for 30 day supply  After discussion will trial MDI inhaler (only Dulera available in hospital) --START Dulera 200-5  mcg with plan to transition to Symbicort as an outpatient  Use with spacer --Decrease neb use to pulmicort daily and duonebs BID. DC brovana --Pulmonary hygiene including IS and PT/OOB as tolerated --OK to continue amio for now but will need to discuss with cardiology about ongoing use if ILD demonstrates progression on outpatient PFTs --Will arrange outpatient PFTs --Will arrange outpatient follow-up with me (Dr. Everardo All) when closer to discharge --Will consider pulmonary rehab referral when outpatient   Best Practice (right click and "Reselect all SmartList Selections" daily)   Diet/type: Regular consistency (see orders) DVT prophylaxis DOAC Pressure ulcer(s): pressure ulcer assessment deferred  GI prophylaxis: PPI Added while on high dose steroids Lines: N/A Foley:  N/A Code Status:  full code Last date of multidisciplinary goals of care discussion [Per primary]  Critical care time: N/A    Care Time: 35 min  Mechele Collin, M.D. [facility]Critical Care Medicine 12/11/2023 10:56 AM   Please see Amion for pager number to reach on-call Pulmonary and Critical Care Team.

## 2023-12-11 NOTE — Consult Note (Addendum)
WOC Nurse Consult Note: Reason for Consult: Consult requested for right leg wound.  Pt states she was previously followed by a dermatologist who has ordered "white cream" but she is unsure of the name.  Reviewed EMR and I will order Kenalog, which is on the previous medication list.  Right posterior calf with full thickness wound, .3X.3X.2cm, yellow and dry.  Dressing procedure/placement/frequency: Topical treatment orders provided for the bedside nurses to perform as follows: Apply Kenalog cream to right posterior leg wound Q day, then cover with foam dressing.  Change foam dressing Q 3 days or PRN soiling. Please re-consult if further assistance is needed.  Thank-you,  Cammie Mcgee MSN, RN, CWOCN, [place], CNS [phone]

## 2023-12-11 NOTE — Progress Notes (Signed)
Mobility Specialist - Progress Note   12/11/23 1156  Mobility  Activity Ambulated with assistance in hallway  Level of Assistance Standby assist, set-up cues, supervision of patient - no hands on  Assistive Device Front wheel walker  Distance Ambulated (ft) 50 ft  Range of Motion/Exercises Active  Activity Response Tolerated well  Mobility Referral Yes  Mobility visit 1 Mobility  Mobility Specialist Start Time (ACUTE ONLY) 1131  Mobility Specialist Stop Time (ACUTE ONLY) 1152  Mobility Specialist Time Calculation (min) (ACUTE ONLY) 21 min   Received in bed and agreed to mobility, had no issues on 3L. Returned to chair with all needs met and family in room.  Marilynne Halsted Mobility Specialist

## 2023-12-11 NOTE — Plan of Care (Signed)

## 2023-12-12 ENCOUNTER — Telehealth (HOSPITAL_BASED_OUTPATIENT_CLINIC_OR_DEPARTMENT_OTHER): Payer: Self-pay | Admitting: Pulmonary Disease

## 2023-12-12 DIAGNOSIS — J849 Interstitial pulmonary disease, unspecified: Secondary | ICD-10-CM | POA: Diagnosis not present

## 2023-12-12 DIAGNOSIS — J9601 Acute respiratory failure with hypoxia: Secondary | ICD-10-CM | POA: Diagnosis not present

## 2023-12-12 LAB — COMPREHENSIVE METABOLIC PANEL
ALT: 34 U/L (ref 0–44)
AST: 22 U/L (ref 15–41)
Albumin: 3.1 g/dL — ABNORMAL LOW (ref 3.5–5.0)
Alkaline Phosphatase: 77 U/L (ref 38–126)
Anion gap: 6 (ref 5–15)
BUN: 24 mg/dL — ABNORMAL HIGH (ref 8–23)
CO2: 29 mmol/L (ref 22–32)
Calcium: 8.5 mg/dL — ABNORMAL LOW (ref 8.9–10.3)
Chloride: 99 mmol/L (ref 98–111)
Creatinine, Ser: 0.67 mg/dL (ref 0.44–1.00)
GFR, Estimated: >60 mL/min (ref >60)
Glucose, Bld: 112 mg/dL — ABNORMAL HIGH (ref 70–99)
Potassium: 4.3 mmol/L (ref 3.5–5.1)
Sodium: 134 mmol/L — ABNORMAL LOW (ref 135–145)
Total Bilirubin: 0.4 mg/dL (ref <1.2)
Total Protein: 7.1 g/dL (ref 6.5–8.1)

## 2023-12-12 LAB — FERRITIN: Ferritin: 219 ng/mL (ref 11–307)

## 2023-12-12 LAB — IRON AND TIBC
Iron: 45 ug/dL (ref 28–170)
Saturation Ratios: 16 % (ref 10.4–31.8)
TIBC: 281 ug/dL (ref 250–450)
UIBC: 236 ug/dL

## 2023-12-12 LAB — CBC WITH DIFFERENTIAL/PLATELET
Abs Immature Granulocytes: 0.03 10*3/uL (ref 0.00–0.07)
Basophils Absolute: 0 10*3/uL (ref 0.0–0.1)
Basophils Relative: 0 %
Eosinophils Absolute: 0 10*3/uL (ref 0.0–0.5)
Eosinophils Relative: 0 %
HCT: 41 % (ref 36.0–46.0)
Hemoglobin: 12.7 g/dL (ref 12.0–15.0)
Immature Granulocytes: 0 %
Lymphocytes Relative: 10 %
Lymphs Abs: 0.9 10*3/uL (ref 0.7–4.0)
MCH: 29.5 pg (ref 26.0–34.0)
MCHC: 31 g/dL (ref 30.0–36.0)
MCV: 95.1 fL (ref 80.0–100.0)
Monocytes Absolute: 0.6 10*3/uL (ref 0.1–1.0)
Monocytes Relative: 7 %
Neutro Abs: 7.2 10*3/uL (ref 1.7–7.7)
Neutrophils Relative %: 83 %
Platelets: 168 10*3/uL (ref 150–400)
RBC: 4.31 MIL/uL (ref 3.87–5.11)
RDW: 15.1 % (ref 11.5–15.5)
WBC: 8.7 10*3/uL (ref 4.0–10.5)
nRBC: 0 % (ref 0.0–0.2)

## 2023-12-12 LAB — RETICULOCYTES
Immature Retic Fract: 15.5 % (ref 2.3–15.9)
RBC.: 4.13 MIL/uL (ref 3.87–5.11)
Retic Count, Absolute: 52.9 10*3/uL (ref 19.0–186.0)
Retic Ct Pct: 1.3 % (ref 0.4–3.1)

## 2023-12-12 LAB — FOLATE: Folate: 10 ng/mL (ref >5.9)

## 2023-12-12 LAB — PHOSPHORUS: Phosphorus: 3.1 mg/dL (ref 2.5–4.6)

## 2023-12-12 LAB — VITAMIN B12: Vitamin B-12: 280 pg/mL (ref 180–914)

## 2023-12-12 LAB — MAGNESIUM: Magnesium: 2.5 mg/dL — ABNORMAL HIGH (ref 1.7–2.4)

## 2023-12-12 MED ORDER — ONDANSETRON HCL 4 MG PO TABS: 4.0000 mg | ORAL_TABLET | Freq: Four times a day (QID) | ORAL | 0 refills | Status: DC | PRN

## 2023-12-12 MED ORDER — PANTOPRAZOLE SODIUM 40 MG PO TBEC: 40.0000 mg | DELAYED_RELEASE_TABLET | Freq: Every day | ORAL | 0 refills | Status: DC

## 2023-12-12 MED ORDER — AMIODARONE HCL 100 MG PO TABS: 100.0000 mg | ORAL_TABLET | Freq: Every day | ORAL | 0 refills | Status: DC

## 2023-12-12 MED ORDER — ACETAMINOPHEN 325 MG PO TABS: 650.0000 mg | ORAL_TABLET | Freq: Four times a day (QID) | ORAL | 0 refills | Status: AC | PRN

## 2023-12-12 MED ORDER — PREDNISONE 10 MG PO TABS: ORAL_TABLET | ORAL | 0 refills | Status: AC

## 2023-12-12 MED ORDER — AEROCHAMBER PLUS FLO-VU MISC: 0 refills | Status: DC

## 2023-12-12 MED ORDER — AZITHROMYCIN 500 MG PO TABS: 500.0000 mg | ORAL_TABLET | Freq: Every day | ORAL | 0 refills | Status: AC

## 2023-12-12 MED ORDER — BUDESONIDE-FORMOTEROL FUMARATE 160-4.5 MCG/ACT IN AERO: 2.0000 | INHALATION_SPRAY | Freq: Two times a day (BID) | RESPIRATORY_TRACT | 1 refills | Status: DC

## 2023-12-12 NOTE — Discharge Summary (Incomplete)
Physician Discharge Summary   Patient: [name] MRN: [id number] DOB: 05-23-1933  Admit date:     12/09/2023  Discharge date: 12/12/23  Discharge Physician: Marguerita Merles, DO   PCP: Thana Ates, MD   Recommendations at discharge:    ***  Discharge Diagnoses: Principal Problem:   Acute hypoxic respiratory failure [facility])  Resolved Problems:   * No resolved hospital problems. [place] Hospital Course: The patient is an 89 year old Caucasian female with a past medical history significant for Manson Passey to breast cancer, hypertension, hypothyroidism, paroxysmal atrial fibrillation on anticoagulation, history of ILD, chronic diastolic CHF, mitral valve prolapse, history of DVT and other comorbidities who presented to the ED with worsening shortness of breath and cough.  She had been traveling several days and developed a nonproductive cough as well as subjective fever and noticed wheezing in her lungs.  She went to the urgent care was found to be hypoxic so she is in the ED for further evaluation assessment.  On arrival to the ED she was afebrile and hemodynamic stable and had to be placed on 2 L supplemental oxygen where her O2 saturations were then around 90%.  Chest x-ray showed no acute findings and she was given nebulizers and when a ambulatory home O2 screen was done and resulted in persistent hypoxia so she was admitted for further workup.  Pulmonary's been consulted for further evaluation recommendations and adjustments have been made. She is being transitioned to [facility] today and anticipating D/C in the next 24 hours.  Assessment and Plan:  Acute Hypoxic Respiratory Failure most likely secondary to Interstitial Lung Disease -Patient presents with wheezing -Her CXR was done and showed "Patient is slightly rotated. The heart size and mediastinal contours are stable. Left-sided implanted cardiac device remains in place. No focal airspace consolidation, pleural effusion, or pneumothorax.  Exaggerated thoracic kyphosis." -CT Chest w/o Contrast "Cardiomegaly, with prominent biatrial dilatation.4.7 cm ascending thoracic aortic aneurysm. Progressive subpleural pulmonary fibrotic changes, with biapical ground-glass opacities consistent with superimposed alveolitis. Slight progression of the bilateral cylindrical bronchiectasis seen previously. Trace bilateral pleural effusions. Aortic Atherosclerosis." -Patient has a Known history of COPD and prior CT's with fibrosis -Pulmonary Consulted and recommending continuing O2 for a Supplemental Goal > 88% -SpO2: 99 % O2 Flow Rate (L/min): 3 L/min FiO2 (%): 32 %; Continues to Feel SOB -Continuous pulse oximetry and maintain O2 saturation greater than 88% -Continue supplemental oxygen via nasal cannula wean O2 as tolerated -Per pulmonary she will need extended prednisone taper and recommending 50 mg daily for 7 days and decreasing by 10 mg weekly -Was initiated on with DuoNebs twice daily and Pulmicort and Brovana pulmonary is decreasing her Pulmicort to daily and discontinuing her Brovana and initiating her on Dulera 200/5 mcg with plan to transition to Symbicort as an outpatient -Initiated on flutter valve and incentive spirometry -Will need pulmonary hygiene including incentive spirometer and out of bed was tolerated -Per pulmonary recommend continue Amio for now but will need to discuss with cardiology about ongoing use of ILD demonstrates progression on her outpatient PFTs -Patient will likely have a outpatient pulmonary rehab referral when she is seen in clinic -C/w Azithromycin 500 mg po at bedtime -CXR done today and showed "Mild vascular congestion. Left base consolidation or volume loss and effusion." -Will check ambulatory home O2 screen and repeat CXR in the AM   Paroxysmal A-Fib -Continue Amiodarone 100 mg po at bedtime for now as Pulmonary feels Symptoms are from a ILD Flare -C/w Anticoagulation  with Apixaban 5 mg po  BID -Continue to Monitor on Telemetry   Hypothyroidism -Continue Levothyroxine 125 mcg po Daily    History of Breast Cancer -Continue to Monitor and will need to follow up in the outpatient setting   Essential Hypertension -Continue Losartan 50 mg po daily and Metoprolol Succinate 25 mg po at bedtime -Continue to Monitor BP per Protocol  -Last BP reading was 146/86   Urinary Retention -Continue Mirabegron ER 25 mg po at bedtime  Hyperbilirubinemia, improved -Bilirubin Trend: Recent Labs  Lab 12/09/23 1727 12/11/23 0451  BILITOT 1.2* 0.4  -Continue to Monitor and Trend and repeat CMP in the AM   Metabolic Acidosis -Mild and Improved. Patient now has a CO2 of 27, anion gap of 6, Chloride Level of 98 -Continue to Monitor and Trend and repeat CMP in the AM    Hypokalemia -Patient's K+ Level Trend: Recent Labs  Lab 12/09/23 1727 12/10/23 0539 12/11/23 0451  K 3.2* 4.9 4.4  -Continue to Monitor and Replete as Necessary -Repeat CMP in the AM   Leukocytosis -In the setting of Steroid Demargination -WBC Trend:  Recent Labs  Lab 12/09/23 1727 12/10/23 0539 12/11/23 0451  WBC 8.3 6.5 13.5*  -Continue to Monitor and Trend repeat CBC in the AM  Normocytic Anemia -Hgb/Hct Trend: Recent Labs  Lab 12/09/23 1727 12/10/23 0539 12/11/23 0451  HGB 12.8 11.7* 12.4  HCT 39.4 35.7* 38.4  MCV 94.7 93.0 93.9  -Check Anemia Panel in the AM -Continue to Monitor for S/Sx of Bleeding; No overt bleeding noted -Repeat CBC in the AM   Thrombocytopenia, improved  -Platelet Count Trend: Recent Labs  Lab 12/09/23 1727 12/10/23 0539 12/11/23 0451  PLT 159 148* 169  -Continue to Monitor for S/Sx of Bleeding; No over bleeding noted -Repeat CBC in the AM   GERD/GI Prophylaxis -C/w Pantoprazole 20 mg po Daily   Right Leg Wound -WOC Nurse consultation  Hypoalbuminemia -Patient's Albumin Trend: Recent Labs  Lab 12/09/23 1727 12/11/23 0451  ALBUMIN 3.8 3.3*  -Continue  to Monitor and Trend and repeat CMP in the AM    Assessment and Plan: No notes have been filed under this hospital service. Service: Hospitalist     {Tip this will not be part of the note when signed There is no height or weight on file to calculate BMI. , ,  (Optional):26781}  {(NOTE) Pain control PDMP Statment (Optional):26782} Consultants: *** Procedures performed: ***  Disposition: {Plan; Disposition:26390} Diet recommendation:  Discharge Diet Orders (From admission, onward)     Start     Ordered   12/12/23 0000  Diet - low sodium heart healthy        12/12/23 1325           {Diet_Plan:26776} DISCHARGE MEDICATION: Allergies as of 12/12/2023       Reactions   Adhesive [tape] Rash, Other (See Comments)   Including EKG electrodes and the like   Codeine Other (See Comments)   Reaction not recalled   Latex Itching   Oxycodone-aspirin Nausea And Vomiting   Percodan [oxycodone-aspirin] Nausea And Vomiting        Medication List     STOP taking these medications    phenazopyridine 95 MG tablet Commonly known as: PYRIDIUM       TAKE these medications    acetaminophen 325 MG tablet Commonly known as: TYLENOL Take 2 tablets (650 mg total) by mouth every 6 (six) hours as needed for mild pain (pain score 1-3) (or Fever >/=  101).   Aerochamber Plus Device Needs Aerochamber with Inhalers   amiodarone 100 MG tablet Commonly known as: PACERONE Take 1 tablet (100 mg total) by mouth at bedtime. What changed:  medication strength when to take this   apixaban 5 MG Tabs tablet Commonly known as: Eliquis Take 1 tablet (5 mg total) by mouth 2 (two) times daily.   azithromycin 500 MG tablet Commonly known as: ZITHROMAX Take 1 tablet (500 mg total) by mouth daily for 3 days.   budesonide-formoterol 160-4.5 MCG/ACT inhaler Commonly known as: Symbicort Inhale 2 puffs into the lungs 2 (two) times daily.   Gemtesa 75 MG Tabs Generic drug: Vibegron Take 1  tablet (75 mg total) by mouth daily. What changed:  when to take this Another medication with the same name was removed. Continue taking this medication, and follow the directions you see here.   levothyroxine 125 MCG tablet Commonly known as: SYNTHROID Take 125 mcg by mouth daily before breakfast.   losartan 50 MG tablet Commonly known as: COZAAR Take 1 tablet (50 mg total) by mouth daily.   LUBRICATING EYE DROPS OP Place 1 drop into both eyes 3 (three) times daily as needed (for dryness).   metoprolol succinate 25 MG 24 hr tablet Commonly known as: TOPROL-XL Take 1 tablet (25 mg total) by mouth daily.   Mucinex 600 MG 12 hr tablet Generic drug: guaiFENesin Take 600 mg by mouth 2 (two) times daily as needed for cough or to loosen phlegm.   mupirocin ointment 2 % Commonly known as: BACTROBAN Apply 1 Application topically See admin instructions. Apply to affected area/ulcer on the right leg once a day   ondansetron 4 MG tablet Commonly known as: ZOFRAN Take 1 tablet (4 mg total) by mouth every 6 (six) hours as needed for nausea.   pantoprazole 40 MG tablet Commonly known as: PROTONIX Take 1 tablet (40 mg total) by mouth daily. Start taking on: December 13, 2023   predniSONE 10 MG tablet Commonly known as: DELTASONE Take 5 tablets (50 mg total) by mouth daily with breakfast for 6 days, THEN 4 tablets (40 mg total) daily with breakfast for 7 days, THEN 3 tablets (30 mg total) daily with breakfast for 7 days, THEN 2 tablets (20 mg total) daily with breakfast for 7 days, THEN 1 tablet (10 mg total) daily with breakfast for 7 days. Start taking on: December 12, 2023   triamcinolone cream 0.1 % Commonly known as: KENALOG Apply 1 Application topically daily as needed (for rashes- affected areas).   Vitamin D (Ergocalciferol) 1.25 MG (50000 UNIT) Caps capsule Commonly known as: DRISDOL Take 50,000 Units by mouth every Thursday.               Durable Medical Equipment   (From admission, onward)           Start     Ordered   12/12/23 0000  For home use only DME oxygen       Question Answer Comment  Length of Need 6 Months   Mode or (Route) Nasal cannula   Liters per Minute 3   Frequency Continuous (stationary and portable oxygen unit needed)   Oxygen delivery system Gas      12/12/23 1325              Discharge Care Instructions  (From admission, onward)           Start     Ordered   12/12/23 0000  Discharge wound care:  Comments: Apply Kenalog cream to right posterior leg wound Q day, then cover with foam dressing.  Change foam dressing Q 3 days or PRN soiling   12/12/23 1325            Follow-up Information     [facility] Follow up.   Why: [facility] will be providing your oxygen for you.               Discharge Exam: There were no vitals filed for this visit. ***  Condition at discharge: {DC Condition:26389}  The results of significant diagnostics from this hospitalization (including imaging, microbiology, ancillary and laboratory) are listed below for reference.   Imaging Studies: DG CHEST PORT 1 VIEW Result Date: 12/11/2023 CLINICAL DATA:  Shortness of breath. EXAM: PORTABLE CHEST 1 VIEW COMPARISON:  Same day. FINDINGS: Stable cardiomediastinal silhouette. Left-sided pacemaker is unchanged. Central pulmonary vascular congestion is noted with possible mild bilateral pulmonary edema. Bony thorax is unremarkable. Minimal left pleural effusion may be present. IMPRESSION: Central pulmonary vascular congestion is noted with possible mild bilateral pulmonary edema. Minimal left pleural effusion. Electronically Signed   By: Lupita Raider M.D.   On: 12/11/2023 16:18   DG CHEST PORT 1 VIEW Result Date: 12/11/2023 CLINICAL DATA:  SOB EXAM: PORTABLE CHEST 1 VIEW COMPARISON:  12/09/2023. FINDINGS: Cardiac silhouette obscured. Left base alveolar opacity and effusion. Pulmonary interstitial changes consistent  with edema. Left-sided pacer. Calcified aorta. No pneumothorax. IMPRESSION: Mild vascular congestion. Left base consolidation or volume loss and effusion. Electronically Signed   By: Layla Maw M.D.   On: 12/11/2023 09:09   CT CHEST WO CONTRAST Result Date: 12/09/2023 CLINICAL DATA:  Short of breath, difficulty breathing, wheezing for 1 week EXAM: CT CHEST WITHOUT CONTRAST TECHNIQUE: Multidetector CT imaging of the chest was performed following the standard protocol without IV contrast. RADIATION DOSE REDUCTION: This exam was performed according to the departmental dose-optimization program which includes automated exposure control, adjustment of the mA and/or kV according to patient size and/or use of iterative reconstruction technique. COMPARISON:  12/09/2023, 02/10/2018 FINDINGS: Cardiovascular: The heart is enlarged, with prominent biatrial dilatation. No pericardial effusion. 4.7 cm ascending thoracic aortic aneurysm. Evaluation of the vascular lumen is limited without IV contrast. Atherosclerosis of the thoracic aorta. Dual lead pacer, proximal lead in the right atrium and distal lead in the right ventricle. Mediastinum/Nodes: No enlarged mediastinal or axillary lymph nodes. Thyroid gland, trachea, and esophagus demonstrate no significant findings. Lungs/Pleura: Trace bilateral pleural effusions. Since the prior exam, there is progressive subpleural scarring and interlobular septal thickening, slightly more pronounced within the upper lobes. Scattered areas of ground-glass opacity are seen at the lung apices, new since prior exam. There is mild bilateral cylindrical bronchiectasis, which has progressed slightly in the interim. No other areas of airspace disease. No pneumothorax. The central airways are patent. Upper Abdomen: No acute abnormality. Musculoskeletal: There are no acute or destructive bony abnormalities. Postsurgical changes are seen within the left breast. Reconstructed images  demonstrate no additional findings. IMPRESSION: 1. Cardiomegaly, with prominent biatrial dilatation. 2. 4.7 cm ascending thoracic aortic aneurysm. Evaluation of the vascular lumen is limited without IV contrast. Recommend semi-annual imaging followup by CTA or MRA and referral to cardiothoracic surgery if not already obtained. This recommendation follows 2010 ACCF/AHA/AATS/ACR/ASA/SCA/SCAI/SIR/STS/SVM Guidelines for the Diagnosis and Management of Patients With Thoracic Aortic Disease. Circulation. 2010; 121: Z610-R604. Aortic aneurysm NOS (ICD10-I71.9) 3. Progressive subpleural pulmonary fibrotic changes, with biapical ground-glass opacities consistent with superimposed alveolitis. Slight progression of the  bilateral cylindrical bronchiectasis seen previously. 4. Trace bilateral pleural effusions. 5.  Aortic Atherosclerosis (ICD10-I70.0). Electronically Signed   By: Sharlet Salina M.D.   On: 12/09/2023 20:04   DG Chest 2 View Result Date: 12/09/2023 CLINICAL DATA:  Shortness of breath EXAM: CHEST - 2 VIEW COMPARISON:  10/18/2022 FINDINGS: Patient is slightly rotated. The heart size and mediastinal contours are stable. Left-sided implanted cardiac device remains in place. No focal airspace consolidation, pleural effusion, or pneumothorax. Exaggerated thoracic kyphosis. IMPRESSION: No active cardiopulmonary disease. Electronically Signed   By: Duanne Guess D.O.   On: 12/09/2023 17:59   CUP PACEART REMOTE DEVICE CHECK Result Date: 11/23/2023 Scheduled remote reviewed. Normal device function.  Next remote 91 days. [place], CVRS   Microbiology: Results for orders placed or performed during the hospital encounter of 12/09/23  Resp panel by RT-PCR (RSV, Flu A&B, Covid) Anterior Nasal Swab     Status: None   Collection Time: 12/09/23  5:38 PM   Specimen: Anterior Nasal Swab  Result Value Ref Range Status   SARS Coronavirus 2 by RT PCR NEGATIVE NEGATIVE Final    Comment: (NOTE) SARS-CoV-2 target nucleic  acids are NOT DETECTED.  The SARS-CoV-2 RNA is generally detectable in upper respiratory specimens during the acute phase of infection. The lowest concentration of SARS-CoV-2 viral copies this assay can detect is 138 copies/mL. A negative result does not preclude SARS-Cov-2 infection and should not be used as the sole basis for treatment or other patient management decisions. A negative result may occur with  improper specimen collection/handling, submission of specimen other than nasopharyngeal swab, presence of viral mutation(s) within the areas targeted by this assay, and inadequate number of viral copies(<138 copies/mL). A negative result must be combined with clinical observations, patient history, and epidemiological information. The expected result is Negative.  Fact Sheet for Patients:  BloggerCourse.com  Fact Sheet for Healthcare Providers:  SeriousBroker.it  This test is no t yet approved or cleared by the [place] FDA and  has been authorized for detection and/or diagnosis of SARS-CoV-2 by FDA under an Emergency Use Authorization (EUA). This EUA will remain  in effect (meaning this test can be used) for the duration of the COVID-19 declaration under Section 564(b)(1) of the Act, 21 U.S.C.section 360bbb-3(b)(1), unless the authorization is terminated  or revoked sooner.       Influenza A by PCR NEGATIVE NEGATIVE Final   Influenza B by PCR NEGATIVE NEGATIVE Final    Comment: (NOTE) The Xpert Xpress SARS-CoV-2/FLU/RSV plus assay is intended as an aid in the diagnosis of influenza from Nasopharyngeal swab specimens and should not be used as a sole basis for treatment. Nasal washings and aspirates are unacceptable for Xpert Xpress SARS-CoV-2/FLU/RSV testing.  Fact Sheet for Patients: BloggerCourse.com  Fact Sheet for Healthcare Providers: SeriousBroker.it  This  test is not yet approved or cleared by the [place] FDA and has been authorized for detection and/or diagnosis of SARS-CoV-2 by FDA under an Emergency Use Authorization (EUA). This EUA will remain in effect (meaning this test can be used) for the duration of the COVID-19 declaration under Section 564(b)(1) of the Act, 21 U.S.C. section 360bbb-3(b)(1), unless the authorization is terminated or revoked.     Resp Syncytial Virus by PCR NEGATIVE NEGATIVE Final    Comment: (NOTE) Fact Sheet for Patients: BloggerCourse.com  Fact Sheet for Healthcare Providers: SeriousBroker.it  This test is not yet approved or cleared by the [place] FDA and has been authorized for detection and/or diagnosis  of SARS-CoV-2 by FDA under an Emergency Use Authorization (EUA). This EUA will remain in effect (meaning this test can be used) for the duration of the COVID-19 declaration under Section 564(b)(1) of the Act, 21 U.S.C. section 360bbb-3(b)(1), unless the authorization is terminated or revoked.  Performed at [facility], [address], [place], [place] [address]     Labs: CBC: Recent Labs  Lab 12/09/23 1727 12/10/23 0539 12/11/23 0451 12/12/23 0453  WBC 8.3 6.5 13.5* 8.7  NEUTROABS 6.4  --  11.9* 7.2  HGB 12.8 11.7* 12.4 12.7  HCT 39.4 35.7* 38.4 41.0  MCV 94.7 93.0 93.9 95.1  PLT 159 148* 169 168   Basic Metabolic Panel: Recent Labs  Lab 12/09/23 1727 12/10/23 0539 12/11/23 0451 12/12/23 0453  NA 134* 132* 131* 134*  K 3.2* 4.9 4.4 4.3  CL 100 101 98 99  CO2 23 21* 27 29  GLUCOSE 146* 151* 120* 112*  BUN 16 17 25* 24*  CREATININE 0.66 0.75 0.78 0.67  CALCIUM 8.2* 8.5* 8.4* 8.5*  MG  --   --  2.3 2.5*  PHOS  --   --  2.7 3.1   Liver Function Tests: Recent Labs  Lab 12/09/23 1727 12/11/23 0451 12/12/23 0453  AST 24 22 22   ALT 13 13 34  ALKPHOS 92 81 77  BILITOT 1.2* 0.4 0.4  PROT 7.7 7.2  7.1  ALBUMIN 3.8 3.3* 3.1*   CBG: No results for input(s): "GLUCAP" in the last 168 hours.  Discharge time spent: {LESS THAN/GREATER UEAV:40981} 30 minutes.  Signed: Merlene Laughter, DO [facility] 12/12/2023

## 2023-12-12 NOTE — TOC Transition Note (Signed)
Transition of Care [facility]) - Discharge Note   Patient Details  Name: [name] MRN: [id number] Date of Birth: 1933/05/22  Transition [facility] CM/SW Contact:  Darleene Cleaver, LCSW Phone Number: 12/12/2023, 3:51 PM   Clinical Narrative:     CSW was informed that patient needed oxygen.  She did not have a preference for agency.  CSW was able to set up oxygen with [facility].  [facility] will deliver oxygen to patient's room prior to discharge.  TOC signing off.   Final next level of care: Home/Self Care Barriers to Discharge: Barriers Resolved   Patient Goals and CMS Choice Patient states their goals for this hospitalization and ongoing recovery are:: To return back home.   Choice offered to / list presented to : Patient      Discharge Placement               Home with oxygen.        Discharge Plan and Services Additional resources added to the After Visit Summary for                  DME Arranged: Oxygen DME Agency: [employer] Date DME Agency Contacted: 12/12/23 Time DME Agency Contacted: 0230 Representative spoke with at DME Agency: Vaughan Basta            Social Drivers of Health (SDOH) Interventions SDOH Screenings   Food Insecurity: No Food Insecurity (12/10/2023)  Housing: Unknown (12/10/2023)  Transportation Needs: No Transportation Needs (12/10/2023)  Utilities: Not At Risk (12/10/2023)  Tobacco Use: Low Risk  (12/09/2023)     Readmission Risk Interventions     No data to display

## 2023-12-12 NOTE — Consult Note (Signed)
NAME:  [name], MRN:  [id number], DOB:  [birth year], LOS: 3 ADMISSION DATE:  12/09/2023, CONSULTATION DATE:  12/10/23   REFERRING MD:  Marguerita Merles, MD CHIEF COMPLAINT:  ILD vs amio toxicity   History of Present Illness:  89 year old female with hx ILD, atrial fib/DVT, chronic diastolic heart failure, mitral valve prolapse who presented to the ED with shortness of breath and cough. She recently traveled to [place] for 8 days and after returning had developed a nonproductive cough, wheeze, subjective fever. She went to urgent care and was found with SpO2 <88% so sent to ED. Started on 2L O2 and nebulizers. CXR negative. RVP negative. CT chest with progressive subpleural fibrosis and biapical GGO, trace bilateral pleural effusions.   She was previously following [facility] with Dr. Marchelle Gearing for NSIP and only had mild dyspnea at that time. She reports that her dyspnea has worsened in the last year and has attributed this primarily to her cardiac issues. Has not needed oxygen in the past.   Pertinent  Medical History  ILD, HTN, chronic diastolic heart failure, hx systolic heart failure s/p PM with normal EF, mitral valve prolapse, atrial fib/flutter on eliquis, SVT, hx breast cancer, hypothyroidism  Significant Hospital Events: Including procedures, antibiotic start and stop dates in addition to other pertinent events     Interim History / Subjective:  Unchanged O2 requirement but feeling better today. Doing well with Dulera with spacer  Objective   Blood pressure (!) 158/95, pulse 70, temperature 98.6 F (37 C), temperature source Oral, resp. rate 18, SpO2 (!) 87%.        Intake/Output Summary (Last 24 hours) at 12/12/2023 1114 Last data filed at 12/12/2023 0543 Gross per 24 hour  Intake 600 ml  Output 1000 ml  Net -400 ml   There were no vitals filed for this visit.  Physical Exam: General: Well-appearing, no acute distress HENT: [place], AT Eyes: EOMI, no scleral  icterus Respiratory: Clear to auscultation bilaterally.  No crackles, wheezing or rales Cardiovascular: RRR, -M/R/G, no JVD Extremities:-Edema,-tenderness Neuro: AAO x4, CNII-XII grossly intact Psych: Normal mood, normal affect   Resolved leukocytosis   Assessment & Plan:   Acute hypoxemic respiratory failure 2/2 ILD exacerbation History of NSIP Pulmonary consulted for recommendations for ILD management and evaluation for possible amiodarone toxicity. Patient reports has been on amio for 10 years however documentation comments no amio use until 2021 with cardiology. CT imaging reviewed since 2013 demonstrating mild NSIP changes prior to medication with progressive but minimal changes with subpleural and bronchiectasis. Possible that amio has been contributing but has been overall stable respiratory symptoms until the last year with progressive dyspnea. Suspect this is chronic underlying lung issues consistent with NSIP that has mildly progressed and now worsened in setting of exacerbation. Imaging not consistent with UIP or amio toxicity. Recent travel to [place] increased risk of ILD exacerbation with high altitude exposure and possible viral source - which is likely driving her symptoms and less likely acute amiodarone toxicity. Maybe mild heart failure with trace bilateral pleural effusions on imaging  Stable on 3L. Able to use MDI inhaler with spacer. Counseled on oxygen, bronchodilators and exercise as primary management for her chronic lung disease  --Wean supplemental O2 for goal >88%. Will need 3L with activity and discharge --Agree with prednisone taper. Currently on 50 mg. Recommend decreasing by 10 mg weekly --CONTINUE Dulera 200-5 mcg with plan to transition to Symbicort as an outpatient. Use with spacer --Discontinue nebs --  Pulmonary hygiene including IS and PT/OOB as tolerated --Arranged outpatient PFTs for 01/31/23 at 9:30 AM --Arrange outpatient follow-up with me (Dr.  Everardo All) on 01/31/23 at 11:15 AM --Will consider pulmonary rehab referral when outpatient --OK to continue amio for now but will need to discuss with cardiology about ongoing use if ILD demonstrates progression on outpatient PFTs  Best Practice (right click and "Reselect all SmartList Selections" daily)   Diet/type: Regular consistency (see orders) DVT prophylaxis DOAC Pressure ulcer(s): pressure ulcer assessment deferred  GI prophylaxis: PPI Added while on high dose steroids Lines: N/A Foley:  N/A Code Status:  full code Last date of multidisciplinary goals of care discussion [Per primary]  Critical care time: N/A    Care Time: 36 min  Mechele Collin, M.D. [facility] Pulmonary/Critical Care Medicine 12/12/2023 11:14 AM   Please see Amion for pager number to reach on-call Pulmonary and Critical Care Team.

## 2023-12-12 NOTE — Progress Notes (Signed)
Mobility Specialist - Progress Note   12/12/23 1107  Oxygen Therapy  SpO2 (!) 87 %  O2 Device Room Air  Patient Activity (if Appropriate) Ambulating  Mobility  Activity Ambulated with assistance in hallway  Level of Assistance Standby assist, set-up cues, supervision of patient - no hands on  Assistive Device Front wheel walker  Distance Ambulated (ft) 250 ft  Activity Response Tolerated well  Mobility Referral Yes  Mobility visit 1 Mobility  Mobility Specialist Start Time (ACUTE ONLY) 1047  Mobility Specialist Stop Time (ACUTE ONLY) 1106  Mobility Specialist Time Calculation (min) (ACUTE ONLY) 19 min   Nurse requested Mobility Specialist to perform oxygen saturation test with pt which includes removing pt from oxygen both at rest and while ambulating.  Below are the results from that testing.     Patient Saturations on Room Air at Rest = spO2 90%  Patient Saturations on Room Air while Ambulating = sp02 87% .   Patient Saturations on 3 Liters of oxygen while Ambulating = sp02 90%  At end of testing pt left in room on 3  Liters of oxygen.  Reported results to nurse. Pt received in bed and agreeable to mobility. Pt was minA from supine>sitting. No complaints during session. Pt to recliner after session with all needs met. MD in room.   Pre-mobility: 90% SpO2 (RA) During mobility: 87-90% SpO2 (RA/3L [place]) Post-mobility: 88% SPO2 (3L [place])  Chief Technology Officer

## 2023-12-12 NOTE — Telephone Encounter (Signed)
Scheduled for 01/31/23 with Pulmonary clinic with PFTs prior

## 2023-12-15 ENCOUNTER — Telehealth (HOSPITAL_BASED_OUTPATIENT_CLINIC_OR_DEPARTMENT_OTHER): Payer: Self-pay | Admitting: Pulmonary Disease

## 2023-12-15 NOTE — Telephone Encounter (Signed)
 Discussed with patient. Due to cooking over gas stove for long periods of time. OK to hold off on oxygen during that time but encouraged to wear oxygen with activity when able. Also discussed mucinex  BID PRN for chest congestion. She is having difficulty pushing her inhaler but has spacer and will use two hands to spray into spacer. Closing encounter.

## 2023-12-15 NOTE — Telephone Encounter (Signed)
 Patient is calling back. She would like to know if she should take off her oxygen when cooking on a gas stove. Call back number [phone] or [phone]

## 2023-12-15 NOTE — Telephone Encounter (Signed)
 Patient called in regarding O2 Dr. Everardo All started her on- asking if she can still wear oxygen while cooking with a gas oven  Please advise and call patient back.

## 2023-12-19 ENCOUNTER — Telehealth: Payer: Self-pay

## 2023-12-19 ENCOUNTER — Ambulatory Visit: Payer: Medicare Other | Admitting: Obstetrics

## 2023-12-19 ENCOUNTER — Encounter: Payer: Self-pay | Admitting: Obstetrics

## 2023-12-19 VITALS — BP 139/84 | HR 74

## 2023-12-19 DIAGNOSIS — L950 Livedoid vasculitis: Secondary | ICD-10-CM | POA: Diagnosis not present

## 2023-12-19 DIAGNOSIS — R351 Nocturia: Secondary | ICD-10-CM

## 2023-12-19 DIAGNOSIS — N3946 Mixed incontinence: Secondary | ICD-10-CM | POA: Diagnosis not present

## 2023-12-19 DIAGNOSIS — L97311 Non-pressure chronic ulcer of right ankle limited to breakdown of skin: Secondary | ICD-10-CM | POA: Diagnosis not present

## 2023-12-19 DIAGNOSIS — L0889 Other specified local infections of the skin and subcutaneous tissue: Secondary | ICD-10-CM | POA: Diagnosis not present

## 2023-12-19 DIAGNOSIS — Z85828 Personal history of other malignant neoplasm of skin: Secondary | ICD-10-CM | POA: Diagnosis not present

## 2023-12-19 MED ORDER — GEMTESA 75 MG PO TABS: 75.0000 mg | ORAL_TABLET | Freq: Every day | ORAL | 2 refills | Status: DC

## 2023-12-19 NOTE — Progress Notes (Signed)
 [facility] Return Visit  SUBJECTIVE  History of Present Illness: [name] is a 89 y.o. female seen in follow-up for mixed urinary incontinence, nocturia, vaginal atrophy. Plan at last visit was proceed with sleep study and resume Gemtesa .   Admitted for bronchitis 12/09/23 due to hypoxia, possible due to acute hypoxic respiratory failure secondary to interstitial lung disease and/or amiodarone  toxicity . CXR negative. Rx Duonebs, pulmicort , Brovana , steroid taper and Azithromycin . Pending repeat PFT in 01/2024 and using supplemental O2. Symptoms improved after restarting Gemtesa  Reports voids 0-1x/night, down from 1-4x/night.  Uses 1 pad/day, pad use 1-2x/week at night down from 2 pads/day Leaks 1-2/day down from 10x/day  Previously discontinued Gemtesa  samples around the time of antibiotics. 10/07/23 evaluated for UTI due to urinary frequency and started on Cipro  with resolution of symptoms. Denies UTI symptoms today Urine culture > 100K Proteus mirabilis resistant to Macrobid.    Prior bladder diary with voids every 3 hours  Past Medical History: Patient  has a past medical history of Allergic rhinitis, Arthritis, Breast cancer, left breast (HCC) (01/15/2015), Chronic venous insufficiency, Hematuria, Hypertension, Hypothyroidism, Migraine headache, MVP (mitral valve prolapse), OAB (overactive bladder), Pacemaker, PAF (paroxysmal atrial fibrillation) (HCC), SVT (supraventricular tachycardia) (HCC) (06/2010), and Wears glasses.   Past Surgical History: She  has a past surgical history that includes Left Breast Biopsy (01/15/15); Breast surgery (1990); Abdominal hysterectomy; Tubal ligation; Dilation and curettage of uterus; Colonoscopy; Cardiac catheterization (2011); Breast lumpectomy with radioactive seed localization (Left, 02/14/2015); A-FLUTTER ABLATION (N/A, 02/04/2017); Cardioversion (N/A, 08/10/2017); PACEMAKER IMPLANT (N/A, 08/17/2017); AV NODE ABLATION (N/A, 08/17/2017);  and Breast lumpectomy with radioactive seed localization (Left, 11/18/2021).   Medications: She has a current medication list which includes the following prescription(s): acetaminophen , amiodarone , apixaban , budesonide -formoterol , polyethyl glycol-propyl glycol, levothyroxine , losartan , metoprolol  succinate, mucinex , mupirocin ointment, ondansetron , pantoprazole , prednisone , aerochamber plus, triamcinolone  cream, vitamin d (ergocalciferol), and gemtesa .   Allergies: Patient is allergic to adhesive [tape], codeine, latex, oxycodone-aspirin , and percodan [oxycodone-aspirin ].   Social History: Patient  reports that she has never smoked. She has never used smokeless tobacco. She reports current alcohol  use. She reports that she does not use drugs.     OBJECTIVE     Physical Exam: Vitals:   12/19/23 1111  BP: 139/84  Pulse: 74   Gen: No apparent distress, A&O x 3.  Detailed Urogynecologic Evaluation:  Deferred. Prior exam showed:    ASSESSMENT AND PLAN    [name] is a 89 y.o. with:  1. Mixed stress and urge urinary incontinence   2. Nocturia     Mixed stress and urge urinary incontinence Assessment & Plan: - We previously discussed the symptoms of overactive bladder (OAB), which include urinary urgency, urinary frequency, nocturia, with or without urge incontinence.  While we do not know the exact etiology of OAB, several treatment options exist. We discussed management including behavioral therapy (decreasing bladder irritants, urge suppression strategies, timed voids, bladder retraining), physical therapy, medication; for refractory cases posterior tibial nerve stimulation, sacral neuromodulation, and intravesical botulinum toxin injection.  For anticholinergic medications, we discussed the potential side effects of anticholinergics including dry eyes, dry mouth, constipation, cognitive impairment and urinary retention. For Beta-3 agonist medication, we discussed the potential  side effect of elevated blood pressure which is more likely to occur in individuals with uncontrolled hypertension. - mirabegron  with relief however cost prohibitive.  - Switch to gemtesa  with samples provided and Rx ($100), pt discontinued around the time of UTI. Resumed with relief, patient reports 1  week of samples left. Rx resent to pharmacy, encouraged pt to call and assess cost. Consider Trospium  if cost prohibitive.  - reviewed 3rd line therapy.  For refractory OAB we reviewed the procedure for intravesical Botox injection with cystoscopy in the office and reviewed the risks, benefits and alternatives of treatment including but not limited to infection, need for self-catheterization and need for repeat therapy.  We discussed that there is a 5-15% chance of needing to catheterize with Botox and that this usually resolves in a few months; however can persist for longer periods of time.  Typically Botox injections would need to be repeated every 3-12 months since this is not a permanent therapy.   We discussed the role of sacral neuromodulation and how it works. It requires a test phase, and documentation of bladder function via diary. After a successful test period, a permanent wire and generator are placed in the OR. The battery lasts 5 years on average and would need to be replaced surgically.  The goal of this therapy is at least a 50% improvement in symptoms. It is NOT realistic to expect a 100% cure.  We reviewed the fact that about 30% of patients fail the test phase and are not candidates for permanent generator placement.  We discussed the risk of infection and that the patient would not be able to get an MRI once the device is placed. There are two companies that provide this therapy: Medtronic and [employer]on[employer]. [employer]' product is new and is similar to Medtronic's, but has advantages of a smaller and rechargeable battery and being able to have an MRI with the implant. For all procedures, we discussed  risks of bleeding, infection, damage to surrounding organs including bowel, bladder, blood vessels, ureters and nerves, need for further surgery, risk of postoperative urinary incontinence or retention with need to catheterize, recurrent prolapse, numbness and weakness at any body site, buttock pain, and the rarer risks of blood clot, heart attack, pneumonia, death.    We also discussed the role of percutaneous tibial nerve stimulation and how it works.  She understands it requires 12 weekly visits for temporary neuromodulation of the sacral nerve roots via the tibial nerve and that she may then require continued tapered treatment.  She will return for the procedure. All questions were answered. - discussed residual SUI that may require additional treatment.  - postvoid residual WNL at 1st visit, repeat if clinical change  Orders: -     Gemtesa ; Take 1 tablet (75 mg total) by mouth daily.  Dispense: 30 tablet; Refill: 2  Nocturia Assessment & Plan: - most bothersome per patient due to night time leakage, nocturia resolved with Gemtesa  - avoid fluid intake after 6pm - elevated feet during the day and continue to use compression socks to reduce lower extremity swelling - encouraged to resume trial of gemtesa  to assess symptoms - if symptoms refractory to OAB treatment, encouraged workup for sleep apnea with referral for study study sent. Previously provided number for patient to call and schedule appt   Orders: -     Gemtesa ; Take 1 tablet (75 mg total) by mouth daily.  Dispense: 30 tablet; Refill: 2  Time spent: I spent 28 minutes dedicated to the care of this patient on the date of this encounter to include pre-visit review of records, face-to-face time with the patient discussing mixed urinary incontinence, nocturia, and post visit documentation and ordering medication/ testing.    Lianne ONEIDA Gillis, MD

## 2023-12-19 NOTE — Assessment & Plan Note (Addendum)
-   We previously discussed the symptoms of overactive bladder (OAB), which include urinary urgency, urinary frequency, nocturia, with or without urge incontinence.  While we do not know the exact etiology of OAB, several treatment options exist. We discussed management including behavioral therapy (decreasing bladder irritants, urge suppression strategies, timed voids, bladder retraining), physical therapy, medication; for refractory cases posterior tibial nerve stimulation, sacral neuromodulation, and intravesical botulinum toxin injection.  For anticholinergic medications, we discussed the potential side effects of anticholinergics including dry eyes, dry mouth, constipation, cognitive impairment and urinary retention. For Beta-3 agonist medication, we discussed the potential side effect of elevated blood pressure which is more likely to occur in individuals with uncontrolled hypertension. - mirabegron  with relief however cost prohibitive.  - Switch to gemtesa  with samples provided and Rx ($100), pt discontinued around the time of UTI. Resumed with relief, patient reports 1 week of samples left. Rx resent to pharmacy, encouraged pt to call and assess cost. Consider Trospium  if cost prohibitive.  - reviewed 3rd line therapy.  For refractory OAB we reviewed the procedure for intravesical Botox injection with cystoscopy in the office and reviewed the risks, benefits and alternatives of treatment including but not limited to infection, need for self-catheterization and need for repeat therapy.  We discussed that there is a 5-15% chance of needing to catheterize with Botox and that this usually resolves in a few months; however can persist for longer periods of time.  Typically Botox injections would need to be repeated every 3-12 months since this is not a permanent therapy.   We discussed the role of sacral neuromodulation and how it works. It requires a test phase, and documentation of bladder function via  diary. After a successful test period, a permanent wire and generator are placed in the OR. The battery lasts 5 years on average and would need to be replaced surgically.  The goal of this therapy is at least a 50% improvement in symptoms. It is NOT realistic to expect a 100% cure.  We reviewed the fact that about 30% of patients fail the test phase and are not candidates for permanent generator placement.  We discussed the risk of infection and that the patient would not be able to get an MRI once the device is placed. There are two companies that provide this therapy: Medtronic and [employer]on[employer]. [employer]' product is new and is similar to Medtronic's, but has advantages of a smaller and rechargeable battery and being able to have an MRI with the implant. For all procedures, we discussed risks of bleeding, infection, damage to surrounding organs including bowel, bladder, blood vessels, ureters and nerves, need for further surgery, risk of postoperative urinary incontinence or retention with need to catheterize, recurrent prolapse, numbness and weakness at any body site, buttock pain, and the rarer risks of blood clot, heart attack, pneumonia, death.    We also discussed the role of percutaneous tibial nerve stimulation and how it works.  She understands it requires 12 weekly visits for temporary neuromodulation of the sacral nerve roots via the tibial nerve and that she may then require continued tapered treatment.  She will return for the procedure. All questions were answered. - discussed residual SUI that may require additional treatment.  - postvoid residual WNL at 1st visit, repeat if clinical change

## 2023-12-19 NOTE — Telephone Encounter (Signed)
 Patient called stating [name] is over $300.00 for the first month supply and then $100.00 after that. She is requesting a different medication.

## 2023-12-19 NOTE — Patient Instructions (Signed)
 There is a prescription for Gemtesa at your pharmacy. Please call if it is too expensive and I will send an alternative to your pharmacy to assess cost.

## 2023-12-19 NOTE — Assessment & Plan Note (Signed)
-   most bothersome per patient due to night time leakage, nocturia resolved with Gemtesa  - avoid fluid intake after 6pm - elevated feet during the day and continue to use compression socks to reduce lower extremity swelling - encouraged to resume trial of gemtesa  to assess symptoms - if symptoms refractory to OAB treatment, encouraged workup for sleep apnea with referral for study study sent. Previously provided number for patient to call and schedule appt

## 2023-12-20 MED ORDER — TROSPIUM CHLORIDE ER 60 MG PO CP24: 1.0000 | ORAL_CAPSULE | Freq: Every day | ORAL | 2 refills | Status: DC

## 2023-12-20 NOTE — Telephone Encounter (Signed)
 Gemtesa cost prohibitive. Rx changed to Trospium 60mg  XL.  Can change to Trospium 20mg  BID if cost prohibitive.

## 2023-12-20 NOTE — Addendum Note (Signed)
 Addended byWyatt Haste T on: 12/20/2023 03:42 PM   Modules accepted: Orders

## 2023-12-25 NOTE — Progress Notes (Signed)
 Electrophysiology Office Note:   Date:  12/27/2023  ID:  [name], DOB Jul 29, 1933, MRN [id number]  Primary Cardiologist: None Primary Heart Failure: None Electrophysiologist: Danelle Birmingham, MD       History of Present Illness:   [name] is a 89 y.o. female with h/o uncontrolled AF s/p AV node ablation with PPM, AFL s/p ablation, PVC's, HTN seen today for routine electrophysiology followup.   Hospital admit 12/27-12/30/24 for hypoxic respiratory failure in the setting of suspected ILD. Seen by Pulmonary who recommended continuing amiodarone  for now with plan for repeat PFT's as outpatient and extended prednisone  taper decreasing by 10mg  weekly.   Since last being seen in our clinic the patient reports she has been doing ok since discharge. She is hopeful not to need oxygen in the future.  She is pending follow up in February with Pulmonary.    She denies chest pain, palpitations, dyspnea, PND, orthopnea, nausea, vomiting, dizziness, syncope, edema, weight gain, or early satiety.   Review of systems complete and found to be negative unless listed in HPI.   EP Information / Studies Reviewed:    EKG is not ordered today. EKG from 12/12/23 reviewed which showed ASVP 89 bpm      PPM Interrogation-  reviewed in detail today,  See PACEART report.  Device History: Medtronic Dual Chamber PPM implanted 08/17/2017 for Uncontrolled atrial arrhyhtmia s/p AV node ablation  Studies:  ECHO 04/2022 > LVEF 60-65%, no RWMA, diastolic function could not be evaluated, LA / RA severely dilated     Arrhythmia / AAD Uncontrolled AF s/p AV Node Ablation, PPM  AFL s/p Ablation PVC's intolerant of Flecainide, Multaq  and started on Amiodarone     Risk Assessment/Calculations:    CHA2DS2-VASc Score = 5   This indicates a 7.2% annual risk of stroke. The patient's score is based upon: CHF History: 1 HTN History: 1 Diabetes History: 0 Stroke History: 0 Vascular Disease History: 0 Age Score:  2 Gender Score: 1             Physical Exam:   VS:  BP 106/60   Pulse 84   Ht 5' 8 (1.727 m)   Wt 166 lb (75.3 kg)   LMP  (LMP Unknown)   SpO2 94%   BMI 25.24 kg/m    Wt Readings from Last 3 Encounters:  12/27/23 166 lb (75.3 kg)  09/29/23 159 lb (72.1 kg)  06/21/23 160 lb 4.8 oz (72.7 kg)     GEN: Well nourished, well developed in no acute distress NECK: No JVD; No carotid bruits CARDIAC: Regular rate and rhythm, no murmurs, rubs, gallops RESPIRATORY:  Clear to auscultation without wheezing or rhonchi. Velcro crackles 1/3 way up posteriorly ABDOMEN: Soft, non-tender, non-distended EXTREMITIES:  No edema; No deformity   ASSESSMENT AND PLAN:    Uncontrolled atrial arrhyhtmia s/p AV node ablation s/p Medtronic PPM  Chronic Atrial Fibrillation  12/2023 check > has ventricular escape at 30 bpm  -Normal PPM function -See Pace Art report -No changes today -Ventricular rates well controlled     Secondary Hypercoagulable State  CHA2DS2-VASc 5 -continue Eliquis  5mg  BID, dose reviewed and appropriate by wt/Cr  Chronic Diastolic HF -GDMT per primary Cardiology  -euvolemic on exam   PVC's  -continue amiodarone  for now until follow up PFT's with Pulmonary > may need to discontinue -previously failed Multaq , Flecainide  -continue Toprol  XL -no significant burden of PVC's on device check   Concern for Pulmonary Fibrosis Post admission in 11/2023 -  on extended prednisone  taper  -defer work up to Pulmonary  -velcro crackles noted on exam, no evidence of volume overload on exam    Disposition:   Follow up with Dr. Waddell in 3 months  Signed, Daphne Barrack, MSN, APRN, NP-C, AGACNP-BC [facility] - Electrophysiology  12/27/2023, 1:15 PM

## 2023-12-26 NOTE — Telephone Encounter (Signed)
 [name] called wanting follow up on her Trospium  prescription. PA needed for the Trospium  60 mg once a day. After speaking to the pharmacyit was concluded that this required a PA with insurance. Per communication from Dr Evy note, Rx changed to Trospium  20 mg BID and this was covered. [facility] aware of the changes. [place] has been notified about the changes made.

## 2023-12-27 ENCOUNTER — Ambulatory Visit: Payer: Medicare Other | Attending: Physician Assistant | Admitting: Pulmonary Disease

## 2023-12-27 ENCOUNTER — Encounter: Payer: Self-pay | Admitting: Pulmonary Disease

## 2023-12-27 VITALS — BP 106/60 | HR 84 | Ht 68.0 in | Wt 166.0 lb

## 2023-12-27 DIAGNOSIS — Z950 Presence of cardiac pacemaker: Secondary | ICD-10-CM

## 2023-12-27 DIAGNOSIS — I4821 Permanent atrial fibrillation: Secondary | ICD-10-CM

## 2023-12-27 DIAGNOSIS — I493 Ventricular premature depolarization: Secondary | ICD-10-CM | POA: Diagnosis not present

## 2023-12-27 DIAGNOSIS — D6869 Other thrombophilia: Secondary | ICD-10-CM

## 2023-12-27 LAB — CUP PACEART INCLINIC DEVICE CHECK
Battery Remaining Longevity: 21 mo
Battery Voltage: 2.88 V
Brady Statistic AP VP Percent: 0 %
Brady Statistic AP VS Percent: 0 %
Brady Statistic AS VP Percent: 99.95 %
Brady Statistic AS VS Percent: 0.05 %
Brady Statistic RA Percent Paced: 0 %
Brady Statistic RV Percent Paced: 99.95 %
Date Time Interrogation Session: 20250114131243
Implantable Lead Connection Status: 753985
Implantable Lead Connection Status: 753985
Implantable Lead Implant Date: 20180905
Implantable Lead Implant Date: 20180905
Implantable Lead Location: 753859
Implantable Lead Location: 753860
Implantable Lead Model: 3830
Implantable Lead Model: 5076
Implantable Pulse Generator Implant Date: 20180905
Lead Channel Impedance Value: 285 Ohm
Lead Channel Impedance Value: 323 Ohm
Lead Channel Impedance Value: 399 Ohm
Lead Channel Impedance Value: 532 Ohm
Lead Channel Pacing Threshold Amplitude: 0.5 V
Lead Channel Pacing Threshold Pulse Width: 0.4 ms
Lead Channel Sensing Intrinsic Amplitude: 1.5 mV
Lead Channel Sensing Intrinsic Amplitude: 2.875 mV
Lead Channel Sensing Intrinsic Amplitude: 3 mV
Lead Channel Sensing Intrinsic Amplitude: 3.125 mV
Lead Channel Setting Pacing Amplitude: 2.5 V
Lead Channel Setting Pacing Pulse Width: 1 ms
Lead Channel Setting Sensing Sensitivity: 0.9 mV
Zone Setting Status: 755011

## 2023-12-27 NOTE — Patient Instructions (Signed)
 Medication Instructions:  Your physician recommends that you continue on your current medications as directed. Please refer to the Current Medication list given to you today.  *If you need a refill on your cardiac medications before your next appointment, please call your pharmacy*  Lab Work: None ordered.  If you have labs (blood work) drawn today and your tests are completely normal, you will receive your results only by: MyChart Message (if you have MyChart) OR A paper copy in the mail If you have any lab test that is abnormal or we need to change your treatment, we will call you to review the results.  Testing/Procedures: None ordered.  Follow-Up: At [facility], you and your health needs are our priority.  As part of our continuing mission to provide you with exceptional heart care, we have created designated Provider Care Teams.  These Care Teams include your primary Cardiologist (physician) and Advanced Practice Providers (APPs -  Physician Assistants and Nurse Practitioners) who all work together to provide you with the care you need, when you need it.   Your next appointment:   3 or 4 months  The format for your next appointment:   In Person  Provider:   Danelle Birmingham, MD  Remote monitoring is used to monitor your Pacemaker/ ICD from home. This monitoring reduces the number of office visits required to check your device to one time per year. It allows us  to keep an eye on the functioning of your device to ensure it is working properly.   Important Information About Sugar

## 2023-12-28 ENCOUNTER — Telehealth: Payer: Self-pay | Admitting: Pulmonary Disease

## 2023-12-28 DIAGNOSIS — I5030 Unspecified diastolic (congestive) heart failure: Secondary | ICD-10-CM | POA: Diagnosis not present

## 2023-12-28 DIAGNOSIS — D6869 Other thrombophilia: Secondary | ICD-10-CM | POA: Diagnosis not present

## 2023-12-28 DIAGNOSIS — I7121 Aneurysm of the ascending aorta, without rupture: Secondary | ICD-10-CM | POA: Diagnosis not present

## 2023-12-28 DIAGNOSIS — S81801D Unspecified open wound, right lower leg, subsequent encounter: Secondary | ICD-10-CM | POA: Diagnosis not present

## 2023-12-28 DIAGNOSIS — N1831 Chronic kidney disease, stage 3a: Secondary | ICD-10-CM | POA: Diagnosis not present

## 2023-12-28 DIAGNOSIS — J849 Interstitial pulmonary disease, unspecified: Secondary | ICD-10-CM | POA: Diagnosis not present

## 2023-12-28 DIAGNOSIS — I4891 Unspecified atrial fibrillation: Secondary | ICD-10-CM | POA: Diagnosis not present

## 2023-12-28 NOTE — Telephone Encounter (Signed)
 Plan of care reviewed with Dr. Carolynne Citron in regards to amiodarone , prior rec's from Pulmonary and her current prednisone  taper.   -stop Amiodarone  in light of concerns for pulmonary fibrosis  -reviewed with her husband via phone with Jecenia  -follow up as planned    Creighton Doffing, NP-C, AGACNP-BC [facility] - Electrophysiology  12/28/2023, 4:45 PM

## 2023-12-29 ENCOUNTER — Telehealth: Payer: Self-pay | Admitting: Pulmonary Disease

## 2023-12-29 NOTE — Telephone Encounter (Signed)
Please send DME the mobility specialist note by Jaynee Eagles 12/12/23 and discharge summary from patient's hospitalization.

## 2023-12-29 NOTE — Telephone Encounter (Signed)
Synapes needs Oxygen prescription, any of her walk test, most recent clinical notes, and recent pft, liters per minute and how much she needs to release. This can be faxed to [phone] Attention:ATC       Patient is running out of Oxygen

## 2023-12-30 NOTE — Telephone Encounter (Signed)
I have called patient to let her know that we have faxed the required documents and if she has any further concerns to contact our office or the DME. Patient expressed understanding and looks forward to our visit in Feb 2025

## 2024-01-02 NOTE — Addendum Note (Signed)
Addended by: Elease Etienne A on: 01/02/2024 04:27 PM   Modules accepted: Orders

## 2024-01-02 NOTE — Progress Notes (Signed)
Remote pacemaker transmission.   

## 2024-01-03 NOTE — Progress Notes (Addendum)
[address]       [name] [address]             [phone]   PCP is Thana Ates, MD Referring Provider is Thana Ates, MD  Chief Complaint: Ascending thoracic aortic aneurysm   HPI: This is an 89 year old female with a past medical history of hypertension, chronic venous insufficiency, left breast cancer, MVP, OAB, PAF, SVT, and hypothyroidism who on CT of the chest (without contrast) that was done during a hospitalization for acute respiratory failure (in the setting of suspected ILD) , was incidentally found to have a 4.7 cm ascending thoracic aortic aneurysm. She presents today for further evaluation and to establish surveillance of the ATAA. She denies chest pain, pressure, or tenderness, or LE edema. She presents today with her husband and she is on 2L of oxygen.  Past Medical History:  Diagnosis Date   Allergic rhinitis    Arthritis    bil knees   Breast cancer, left breast (HCC) 01/15/2015   Invasive Mammary   Chronic venous insufficiency    Hematuria    negative workup - Dr. Wanda Plump   Hypertension    Hypothyroidism    Migraine headache    MVP (mitral valve prolapse)    OAB (overactive bladder)    Pacemaker    PAF (paroxysmal atrial fibrillation) (HCC)    SVT (supraventricular tachycardia) (HCC) 06/2010   s/p AVNRT ablation   Wears glasses     Past Surgical History:  Procedure Laterality Date   A-FLUTTER ABLATION N/A 02/04/2017   Procedure: A-Flutter Ablation;  Surgeon: Marinus Maw, MD;  Location: [facility] INVASIVE CV LAB;  Service: Cardiovascular;  Laterality: N/A;   ABDOMINAL HYSTERECTOMY     AV NODE ABLATION N/A 08/17/2017   Procedure: AV Node Ablation;  Surgeon: Marinus Maw, MD;  Location: [facility] INVASIVE CV LAB;  Service: Cardiovascular;  Laterality: N/A;   BREAST LUMPECTOMY WITH RADIOACTIVE SEED LOCALIZATION Left 02/14/2015   Procedure: BREAST LUMPECTOMY WITH RADIOACTIVE SEED LOCALIZATION;  Surgeon: Ovidio Kin, MD;  Location: [facility];  Service: General;  Laterality: Left;   BREAST LUMPECTOMY WITH RADIOACTIVE SEED LOCALIZATION Left 11/18/2021   Procedure: LEFT BREAST LUMPECTOMY WITH RADIOACTIVE SEED LOCALIZATION;  Surgeon: Harriette Bouillon, MD;  Location: [facility];  Service: General;  Laterality: Left;   BREAST SURGERY  1990   rt br bx   CARDIAC CATHETERIZATION  2011   ablasion   CARDIOVERSION N/A 08/10/2017   Procedure: CARDIOVERSION;  Surgeon: Jake Bathe, MD;  Location: [facility] ENDOSCOPY;  Service: Cardiovascular;  Laterality: N/A;   COLONOSCOPY     DILATION AND CURETTAGE OF UTERUS     Left Breast Biopsy  01/15/15   PACEMAKER IMPLANT N/A 08/17/2017   Procedure: Pacemaker Implant;  Surgeon: Marinus Maw, MD;  Location: [facility] INVASIVE CV LAB;  Service: Cardiovascular;  Laterality: N/A;   TUBAL LIGATION      Family History  Problem Relation Age of Onset   Hypertension Mother    Heart disease Mother    Arthritis Mother    Other Father        hardening of the arteries   Heart Problems Sister        pacemaker   Heart attack Brother    Throat cancer Paternal Uncle    Breast cancer Sister        early 8s   Alzheimer's disease Sister    Pulmonary disease  Sister    Breast cancer Daughter 7       Negative genetic testing    Breast cancer Paternal Aunt        dx over 69   Cancer Paternal Aunt        cancer in her leg   Breast cancer Cousin        multiple paternal cousin    Social History Social History   Tobacco Use   Smoking status: Never   Smokeless tobacco: Never   Tobacco comments:    did smoke about 2cigs long time about 54 plus years ago  Vaping Use   Vaping status: Never Used  Substance Use Topics   Alcohol use: Yes    Comment: glass of wine nightly   Drug use: No    Current Outpatient Medications  Medication Sig Dispense Refill   acetaminophen (TYLENOL) 325 MG tablet Take 2 tablets (650 mg total) by mouth every 6 (six) hours as needed for mild pain (pain  score 1-3) (or Fever >/= 101). 20 tablet 0   amiodarone (PACERONE) 100 MG tablet Take 1 tablet (100 mg total) by mouth at bedtime. 30 tablet 0   apixaban (ELIQUIS) 5 MG TABS tablet Take 1 tablet (5 mg total) by mouth 2 (two) times daily. 200 tablet 1   budesonide-formoterol (SYMBICORT) 160-4.5 MCG/ACT inhaler Inhale 2 puffs into the lungs 2 (two) times daily. 1 each 1   Carboxymethylcellul-Glycerin (LUBRICATING EYE DROPS OP) Place 1 drop into both eyes 3 (three) times daily as needed (for dryness).     levothyroxine (SYNTHROID) 125 MCG tablet Take 125 mcg by mouth daily before breakfast.     losartan (COZAAR) 50 MG tablet Take 1 tablet (50 mg total) by mouth daily. 90 tablet 3   metoprolol succinate (TOPROL-XL) 25 MG 24 hr tablet Take 1 tablet (25 mg total) by mouth daily. 30 tablet 11   MUCINEX 600 MG 12 hr tablet Take 600 mg by mouth 2 (two) times daily as needed for cough or to loosen phlegm.     mupirocin ointment (BACTROBAN) 2 % Apply 1 Application topically See admin instructions. Apply to affected area/ulcer on the right leg once a day     ondansetron (ZOFRAN) 4 MG tablet Take 1 tablet (4 mg total) by mouth every 6 (six) hours as needed for nausea. 20 tablet 0   pantoprazole (PROTONIX) 40 MG tablet Take 1 tablet (40 mg total) by mouth daily. 30 tablet 0   predniSONE (DELTASONE) 10 MG tablet Take 5 tablets (50 mg total) by mouth daily with breakfast for 6 days, THEN 4 tablets (40 mg total) daily with breakfast for 7 days, THEN 3 tablets (30 mg total) daily with breakfast for 7 days, THEN 2 tablets (20 mg total) daily with breakfast for 7 days, THEN 1 tablet (10 mg total) daily with breakfast for 7 days. 100 tablet 0   Spacer/Aero-Holding Chambers (AEROCHAMBER PLUS) Device Needs Aerochamber with Inhalers 1 each 0   triamcinolone cream (KENALOG) 0.1 % Apply 1 Application topically daily as needed (for rashes- affected areas).     Trospium Chloride 60 MG CP24 Take 1 capsule (60 mg total) by mouth  daily. 30 capsule 2   Vitamin D, Ergocalciferol, (DRISDOL) 50000 units CAPS capsule Take 50,000 Units by mouth every Thursday.     Allergies  Allergen Reactions   Adhesive [Tape] Rash and Other (See Comments)    Including EKG electrodes and the like   Codeine Other (See Comments)  Reaction not recalled   Latex Itching   Oxycodone-Aspirin Nausea And Vomiting   Percodan [Oxycodone-Aspirin] Nausea And Vomiting   Review of Systems  Chest Pain Klaus.Mock  ] Exertional SOB [ Y ]  Pedal Edema [ N ] Syncope [ N ]  General Review of Systems: [Y] = yes [ N]=no  Consitutional:   nausea Klaus.Mock ];  fever [N ]; fatigue [Y] Resp: cough [ ] ;  hemoptysis[N ];  GI: vomiting[ N]; melena[N ]; hematochezia [N];  [id number][ N]; Heme/Lymph: anemia[N ];  Neuro: TIA[ N];stroke[N ];  seizures[ N];  Endocrine: diabetes[ N];   Vital Signs: Vitals:   01/16/24 1446  BP: 134/86  Pulse: 88      Physical Exam: CV-RRR Neck-No carotid bruit Pulmonary-Clear to auscultation bilaterally Abdomen-Soft, non tender, bowel sounds present Extremities-No LE edema Neurologic-Grossly intact without focal deficit. She has some difficulty walking.   Diagnostic Tests: Narrative & Impression  CLINICAL DATA:  Short of breath, difficulty breathing, wheezing for 1 week   EXAM: CT CHEST WITHOUT CONTRAST   TECHNIQUE: Multidetector CT imaging of the chest was performed following the standard protocol without IV contrast.   RADIATION DOSE REDUCTION: This exam was performed according to the departmental dose-optimization program which includes automated exposure control, adjustment of the mA and/or kV according to patient size and/or use of iterative reconstruction technique.   COMPARISON:  12/09/2023, 02/10/2018   FINDINGS: Cardiovascular: The heart is enlarged, with prominent biatrial dilatation. No pericardial effusion. 4.7 cm ascending thoracic aortic aneurysm. Evaluation of the vascular lumen is limited  without IV contrast. Atherosclerosis of the thoracic aorta. Dual lead pacer, proximal lead in the right atrium and distal lead in the right ventricle.   Mediastinum/Nodes: No enlarged mediastinal or axillary lymph nodes. Thyroid gland, trachea, and esophagus demonstrate no significant findings.   Lungs/Pleura: Trace bilateral pleural effusions. Since the prior exam, there is progressive subpleural scarring and interlobular septal thickening, slightly more pronounced within the upper lobes. Scattered areas of ground-glass opacity are seen at the lung apices, new since prior exam. There is mild bilateral cylindrical bronchiectasis, which has progressed slightly in the interim.   No other areas of airspace disease. No pneumothorax. The central airways are patent.   Upper Abdomen: No acute abnormality.   Musculoskeletal: There are no acute or destructive bony abnormalities. Postsurgical changes are seen within the left breast. Reconstructed images demonstrate no additional findings.   IMPRESSION: 1. Cardiomegaly, with prominent biatrial dilatation. 2. 4.7 cm ascending thoracic aortic aneurysm. Evaluation of the vascular lumen is limited without IV contrast. Recommend semi-annual imaging followup by CTA or MRA and referral to cardiothoracic surgery if not already obtained. This recommendation follows 2010 ACCF/AHA/AATS/ACR/ASA/SCA/SCAI/SIR/STS/SVM Guidelines for the Diagnosis and Management of Patients With Thoracic Aortic Disease. Circulation. 2010; 121: J811-B147. Aortic aneurysm NOS (ICD10-I71.9) 3. Progressive subpleural pulmonary fibrotic changes, with biapical ground-glass opacities consistent with superimposed alveolitis. Slight progression of the bilateral cylindrical bronchiectasis seen previously. 4. Trace bilateral pleural effusions. 5.  Aortic Atherosclerosis (ICD10-I70.0).     Electronically Signed   By: Sharlet Salina M.D.   On: 12/09/2023 20:04   Impression and  Plan: CT of the chest showed a 4.7 cm ascending aortic aneurysm.  Echocardiogram shows a tri cuspid aortic valve without evidence of regurgitation.  We discussed the natural history and and risk factors for growth of ascending aortic aneurysms.  We covered the importance of smoking cessation, tight blood pressure control, refraining from lifting heavy objects, and avoiding fluoroquinolones.  She does not  require surgery at this time and would not be a surgical candidate. Per their request, we will continue surveillance and a repeat CTA was ordered for 6 months.     Ardelle Balls, PA-C [facility] [phone]

## 2024-01-04 DIAGNOSIS — S81801A Unspecified open wound, right lower leg, initial encounter: Secondary | ICD-10-CM | POA: Diagnosis not present

## 2024-01-16 ENCOUNTER — Institutional Professional Consult (permissible substitution): Payer: Medicare Other | Admitting: Physician Assistant

## 2024-01-16 VITALS — BP 134/86 | HR 88 | Ht 68.0 in | Wt 166.0 lb

## 2024-01-16 DIAGNOSIS — I7121 Aneurysm of the ascending aorta, without rupture: Secondary | ICD-10-CM | POA: Diagnosis not present

## 2024-01-16 NOTE — Patient Instructions (Addendum)
  Risk Modification in those with ascending thoracic aortic aneurysm:  Continue good control of blood pressure (prefer SBP 130/80 or less)-continue Losartan and Toprol XL  2. Avoid fluoroquinolone antibiotics (I.e Ciprofloxacin, Avelox, Levofloxacin, Ofloxacin)  3.  Use of statin (to decrease cardiovascular risk)-will defer to PCP to check lipid profile and whether or not to start statin  4.  Exercise and activity limitations is individualized, but in general, contact sports are to be  avoided and one should avoid heavy lifting (defined as half of ideal body weight) and exercises involving sustained Valsalva maneuver.  5. Counseling for those suspected of having genetically mediated disease. First-degree relatives of those with TAA disease should be screened as well as those who have a connective tissue disease (I.e with Marfan syndrome, Ehlers-Danlos syndrome, and Loeys-Dietz syndrome) or a  bicuspid aortic valve,have an increased risk for  complications related to TAA. Patient has no family history of connective tissue disease.  Echo done in 2023 showed aortic valve was tricuspid , no aortic insufficiency or stenosis. She does have mild MVP, moderate MR.  6. She does not have tobacco use history

## 2024-01-27 ENCOUNTER — Other Ambulatory Visit (HOSPITAL_BASED_OUTPATIENT_CLINIC_OR_DEPARTMENT_OTHER): Payer: Self-pay

## 2024-01-27 DIAGNOSIS — J849 Interstitial pulmonary disease, unspecified: Secondary | ICD-10-CM

## 2024-01-31 ENCOUNTER — Ambulatory Visit (HOSPITAL_COMMUNITY)
Admission: RE | Admit: 2024-01-31 | Discharge: 2024-01-31 | Disposition: A | Discharge status: Home / Self Care | Payer: Medicare Other | Source: Ambulatory Visit | Attending: Physician Assistant | Admitting: Physician Assistant

## 2024-01-31 ENCOUNTER — Other Ambulatory Visit (HOSPITAL_COMMUNITY): Payer: Self-pay | Admitting: Physician Assistant

## 2024-01-31 DIAGNOSIS — M7989 Other specified soft tissue disorders: Secondary | ICD-10-CM | POA: Diagnosis not present

## 2024-01-31 DIAGNOSIS — R609 Edema, unspecified: Secondary | ICD-10-CM | POA: Diagnosis not present

## 2024-01-31 DIAGNOSIS — I5032 Chronic diastolic (congestive) heart failure: Secondary | ICD-10-CM | POA: Diagnosis not present

## 2024-01-31 NOTE — Progress Notes (Signed)
Right lower extremity venous duplex has been completed. Preliminary results can be found in CV Proc through chart review.  Results were faxed to [facility].  01/31/24 2:55 PM Olen Cordial RVT

## 2024-02-01 ENCOUNTER — Encounter (HOSPITAL_BASED_OUTPATIENT_CLINIC_OR_DEPARTMENT_OTHER): Payer: Self-pay | Admitting: Pulmonary Disease

## 2024-02-01 ENCOUNTER — Ambulatory Visit (HOSPITAL_BASED_OUTPATIENT_CLINIC_OR_DEPARTMENT_OTHER): Payer: Medicare Other | Admitting: Pulmonary Disease

## 2024-02-01 ENCOUNTER — Other Ambulatory Visit (HOSPITAL_BASED_OUTPATIENT_CLINIC_OR_DEPARTMENT_OTHER): Payer: Self-pay

## 2024-02-01 VITALS — BP 124/72 | HR 71 | Ht 66.25 in | Wt 171.0 lb

## 2024-02-01 DIAGNOSIS — J849 Interstitial pulmonary disease, unspecified: Secondary | ICD-10-CM

## 2024-02-01 DIAGNOSIS — J9621 Acute and chronic respiratory failure with hypoxia: Secondary | ICD-10-CM

## 2024-02-01 LAB — PULMONARY FUNCTION TEST
FEF 25-75 Post: 0.84 L/s
FEF 25-75 Pre: 0.79 L/s
FEF2575-%Change-Post: 6 %
FEF2575-%Pred-Post: 75 %
FEF2575-%Pred-Pre: 70 %
FEV1-%Change-Post: 2 %
FEV1-%Pred-Post: 54 %
FEV1-%Pred-Pre: 53 %
FEV1-Post: 1.02 L
FEV1-Pre: 1 L
FEV1FVC-%Change-Post: 2 %
FEV1FVC-%Pred-Pre: 104 %
FEV6-%Change-Post: 0 %
FEV6-%Pred-Post: 55 %
FEV6-%Pred-Pre: 55 %
FEV6-Post: 1.32 L
FEV6-Pre: 1.32 L
FEV6FVC-%Pred-Post: 106 %
FEV6FVC-%Pred-Pre: 106 %
FVC-%Change-Post: 0 %
FVC-%Pred-Post: 52 %
FVC-%Pred-Pre: 52 %
FVC-Post: 1.32 L
FVC-Pre: 1.32 L
Post FEV1/FVC ratio: 77 %
Post FEV6/FVC ratio: 100 %
Pre FEV1/FVC ratio: 75 %
Pre FEV6/FVC Ratio: 100 %
RV % pred: 104 %
RV: 2.78 L
TLC % pred: 77 %
TLC: 4.2 L

## 2024-02-01 MED ORDER — PREDNISONE 10 MG PO TABS
ORAL_TABLET | ORAL | 0 refills | Status: AC
  Filled 2024-02-01: qty 210, 70d supply, fill #0

## 2024-02-01 MED ORDER — BUDESONIDE-FORMOTEROL FUMARATE 160-4.5 MCG/ACT IN AERO: 2.0000 | INHALATION_SPRAY | Freq: Two times a day (BID) | RESPIRATORY_TRACT | 5 refills | Status: DC

## 2024-02-01 NOTE — Patient Instructions (Signed)
Attempted Full PFT, Pre/Post Spirometry and Lung Volumes Performed Today.

## 2024-02-01 NOTE — Patient Instructions (Signed)
Acute on chronic hypoxemic respiratory failure RESTART prednisone taper as prescribed. Decrease by 10 mg every two weeks Wear 5L O2 continuous with activity and sleep. Will update DME order DME: [employer]

## 2024-02-01 NOTE — Progress Notes (Unsigned)
Subjective:   PATIENT ID: [name] GENDER: female DOB: 1933-05-30, MRN: [id number]  Chief Complaint  Patient presents with   Follow-up    Hospital follow up    Reason for Visit: Hospital follow-up  [name] is a 89 year old female with ILD, paroxysmal atrial fibrillation, mitral valve prolapse, hx DVT, hypothyroidism, hx breast cancer who presents for hospital follow-up.  Discharge summary reviewed. She was discharged on 2L on 12/12/23 with prednisone taper. Previously followed by Dr. Marchelle Gearing for NSIP. Was not on oxygen prior to admission. Question of amiodarone toxicity  Compliant with oxygen and Symbicort. Has felt breathing has been stable. Denies cough or wheezing. Weaned off steroids. Has not been monitoring O2 levels at home.  I have personally reviewed patient's past medical/family/social history, allergies, current medications.  Past Medical History:  Diagnosis Date   Allergic rhinitis    Arthritis    bil knees   Breast cancer, left breast (HCC) 01/15/2015   Invasive Mammary   Chronic venous insufficiency    Hematuria    negative workup - Dr. Wanda Plump   Hypertension    Hypothyroidism    Migraine headache    MVP (mitral valve prolapse)    OAB (overactive bladder)    Pacemaker    PAF (paroxysmal atrial fibrillation) (HCC)    SVT (supraventricular tachycardia) (HCC) 06/2010   s/p AVNRT ablation   Wears glasses      Family History  Problem Relation Age of Onset   Hypertension Mother    Heart disease Mother    Arthritis Mother    Other Father        hardening of the arteries   Heart Problems Sister        pacemaker   Heart attack Brother    Throat cancer Paternal Uncle    Breast cancer Sister        early 24s   Alzheimer's disease Sister    Pulmonary disease Sister    Breast cancer Daughter 23       Negative genetic testing    Breast cancer Paternal Aunt        dx over 71   Cancer Paternal Aunt        cancer in her leg   Breast  cancer Cousin        multiple paternal cousin     Social History   Occupational History   Not on file  Tobacco Use   Smoking status: Never   Smokeless tobacco: Never   Tobacco comments:    did smoke about 2cigs long time about 54 plus years ago  Vaping Use   Vaping status: Never Used  Substance and Sexual Activity   Alcohol use: Yes    Comment: glass of wine nightly   Drug use: No   Sexual activity: Not Currently    Birth control/protection: Surgical    Allergies  Allergen Reactions   Adhesive [Tape] Rash and Other (See Comments)    Including EKG electrodes and the like   Codeine Other (See Comments)    Reaction not recalled   Latex Itching   Oxycodone-Aspirin Nausea And Vomiting   Percodan [Oxycodone-Aspirin] Nausea And Vomiting   Quinolones     Patient with ATAA (ascending thoracic aortic aneurysm)     Outpatient Medications Prior to Visit  Medication Sig Dispense Refill   acetaminophen (TYLENOL) 325 MG tablet Take 2 tablets (650 mg total) by mouth every 6 (six) hours as needed for mild pain (pain  score 1-3) (or Fever >/= 101). 20 tablet 0   apixaban (ELIQUIS) 5 MG TABS tablet Take 1 tablet (5 mg total) by mouth 2 (two) times daily. 200 tablet 1   budesonide-formoterol (SYMBICORT) 160-4.5 MCG/ACT inhaler Inhale 2 puffs into the lungs 2 (two) times daily. 1 each 1   Carboxymethylcellul-Glycerin (LUBRICATING EYE DROPS OP) Place 1 drop into both eyes 3 (three) times daily as needed (for dryness).     levothyroxine (SYNTHROID) 125 MCG tablet Take 125 mcg by mouth daily before breakfast.     losartan (COZAAR) 50 MG tablet Take 1 tablet (50 mg total) by mouth daily. 90 tablet 3   metoprolol succinate (TOPROL-XL) 25 MG 24 hr tablet Take 1 tablet (25 mg total) by mouth daily. 30 tablet 11   mupirocin ointment (BACTROBAN) 2 % Apply 1 Application topically See admin instructions. Apply to affected area/ulcer on the right leg once a day     ondansetron (ZOFRAN) 4 MG tablet Take 1  tablet (4 mg total) by mouth every 6 (six) hours as needed for nausea. 20 tablet 0   Spacer/Aero-Holding Chambers (AEROCHAMBER PLUS) Device Needs Aerochamber with Inhalers 1 each 0   triamcinolone cream (KENALOG) 0.1 % Apply 1 Application topically daily as needed (for rashes- affected areas).     Trospium Chloride 60 MG CP24 Take 1 capsule (60 mg total) by mouth daily. 30 capsule 2   Vitamin D, Ergocalciferol, (DRISDOL) 50000 units CAPS capsule Take 50,000 Units by mouth every Thursday.     amiodarone (PACERONE) 100 MG tablet Take 1 tablet (100 mg total) by mouth at bedtime. (Patient not taking: Reported on 02/01/2024) 30 tablet 0   MUCINEX 600 MG 12 hr tablet Take 600 mg by mouth 2 (two) times daily as needed for cough or to loosen phlegm. (Patient not taking: Reported on 02/01/2024)     pantoprazole (PROTONIX) 40 MG tablet Take 1 tablet (40 mg total) by mouth daily. (Patient not taking: Reported on 02/01/2024) 30 tablet 0   predniSONE (DELTASONE) 10 MG tablet as directed Orally Once a day (Patient not taking: Reported on 02/01/2024)     No facility-administered medications prior to visit.    Review of Systems  Constitutional:  Negative for chills, diaphoresis, fever, malaise/fatigue and weight loss.  HENT:  Negative for congestion.   Respiratory:  Positive for shortness of breath. Negative for cough, hemoptysis, sputum production and wheezing.   Cardiovascular:  Negative for chest pain, palpitations and leg swelling.     Objective:   Vitals:   02/01/24 1040  BP: 124/72  Pulse: 71  SpO2: 97%  Weight: 171 lb (77.6 kg)  Height: 5' 6.25" (1.683 m)   SpO2: 97 %  Physical Exam: General: Well-appearing, no acute distress HENT: [place], AT Eyes: EOMI, no scleral icterus Respiratory: Diminished to auscultation bilaterally.  No crackles, wheezing or rales Cardiovascular: RRR, -M/R/G, no JVD Extremities:-Edema,-tenderness Neuro: AAO x4, CNII-XII grossly intact Psych: Normal mood, normal  affect  Data Reviewed:  Imaging: CT Chest 12/09/23 - Progression of subpleural scarring and septal thickening, scatted GGO. Mild cylindrical bronchiectasis. Trace bilateral effusions  PFT: 02/01/24 FVC 1.32 (52%) FEV1 1.02 (54%) Ratio 77  TLC 77% DLCO Unable to obtain  Interpretation: Moderately severe restrictive defect  Labs: CBC    Component Value Date/Time   WBC 8.7 12/12/2023 0453   RBC 4.31 12/12/2023 0453   RBC 4.13 12/12/2023 0453   HGB 12.7 12/12/2023 0453   HGB 11.8 04/14/2022 1216   HGB 11.1 (L)  02/17/2015 1054   HCT 41.0 12/12/2023 0453   HCT 34.8 04/14/2022 1216   HCT 34.3 (L) 02/17/2015 1054   PLT 168 12/12/2023 0453   PLT 204 04/14/2022 1216   MCV 95.1 12/12/2023 0453   MCV 86 04/14/2022 1216   MCV 87.6 02/17/2015 1054   MCH 29.5 12/12/2023 0453   MCHC 31.0 12/12/2023 0453   RDW 15.1 12/12/2023 0453   RDW 14.2 04/14/2022 1216   RDW 13.5 02/17/2015 1054   LYMPHSABS 0.9 12/12/2023 0453   LYMPHSABS 1.1 03/27/2021 1443   LYMPHSABS 1.3 02/17/2015 1054   MONOABS 0.6 12/12/2023 0453   MONOABS 0.4 02/17/2015 1054   EOSABS 0.0 12/12/2023 0453   EOSABS 0.1 03/27/2021 1443   BASOSABS 0.0 12/12/2023 0453   BASOSABS 0.0 03/27/2021 1443   BASOSABS 0.0 02/17/2015 1054   02/01/24 SATURATION QUALIFICATIONS: (This note is used to comply with regulatory documentation for home oxygen)   Patient Saturations on Room Air at Rest = 98%   Patient Saturations on Room Air while Ambulating = 87%   Patient Saturations on 5 Liters of oxygen while Ambulating = 98%   Please briefly explain why patient needs home oxygen: Patient needs 5 liters of oxygen while ambulating to reach 98%.     Assessment & Plan:   Discussion: 89 year old female with ILD, paroxysmal atrial fibrillation, mitral valve prolapse, hx DVT, hypothyroidism, hx breast cancer who presents for hospital follow-up. In active ILD exacerbation with increased O2 requirement after being weaned off prednisone.  Discussed prolonged taper and oxygen compliance.  Acute on chronic hypoxemic respiratory failure ILD exacerbation Reviewed PFTs with moderately severe restriction RESTART prednisone taper as prescribed. Decrease by 10 mg every two weeks START PPI while on prednisone Wear 5L O2 continuous with activity and sleep. Will update DME order DME: [facility]   Health Maintenance Immunization History  Administered Date(s) Administered   Influenza Split 10/08/2009, 09/15/2010, 10/28/2011, 10/31/2012, 09/27/2013, 09/17/2015   Influenza, High Dose Seasonal PF 09/06/2014, 09/22/2015, 08/31/2017, 08/29/2018, 08/09/2019, 08/21/2019, 09/02/2020, 08/25/2021   Influenza,inj,Quad PF,6+ Mos 09/12/2016   PFIZER(Purple Top)SARS-COV-2 Vaccination 01/02/2020, 01/23/2020, 09/22/2020, 06/11/2021   Pneumococcal Conjugate-13 09/06/2014   Pneumococcal Polysaccharide-23 09/12/2002, 06/20/2017   Td 12/13/2005   Tdap 06/07/2016   Zoster, Live 08/14/2008, 08/19/2020, 12/19/2020   CT Lung Screen- not qualified  Orders Placed This Encounter  Procedures   Ambulatory Referral for DME    Referral Priority:   Routine    Referral Type:   Durable Medical Equipment Purchase    Number of Visits Requested:   1   Meds ordered this encounter  Medications   predniSONE (DELTASONE) 10 MG tablet    Sig: Take 5 tablets (50 mg total) by mouth daily with breakfast for 14 days, THEN 4 tablets (40 mg total) daily with breakfast for 14 days, THEN 3 tablets (30 mg total) daily with breakfast for 14 days, THEN 2 tablets (20 mg total) daily with breakfast for 14 days, THEN 1 tablet (10 mg total) daily with breakfast for 14 days.    Dispense:  210 tablet    Refill:  0   budesonide-formoterol (SYMBICORT) 160-4.5 MCG/ACT inhaler    Sig: Inhale 2 puffs into the lungs 2 (two) times daily.    Dispense:  1 each    Refill:  5    Return in about 1 month (around 02/29/2024).  I have spent a total time of 45-minutes on the day of the  appointment reviewing prior documentation, coordinating care and discussing medical  diagnosis and plan with the patient/family. Imaging, labs and tests included in this note have been reviewed and interpreted independently by me.  Mattingly Fountaine Mechele Collin, MD [facility] 02/01/2024 11:50 AM

## 2024-02-01 NOTE — Progress Notes (Signed)
Attempted Full PFT, Pre/Post Spirometry and Lung Volumes Performed Today.

## 2024-02-02 ENCOUNTER — Telehealth (HOSPITAL_BASED_OUTPATIENT_CLINIC_OR_DEPARTMENT_OTHER): Payer: Self-pay | Admitting: Pulmonary Disease

## 2024-02-02 NOTE — Telephone Encounter (Signed)
Please advise if you think this would be suitable for her

## 2024-02-02 NOTE — Telephone Encounter (Signed)
Please communicate the following to the patient:  Unfortunately not qualified for pulsed POC at this time due to her high oxygen requirements. Pulsed POC can only go up to 5L max and she exceeds that requirement with 5L continuous. Also her insurance will want to see that she chronically needs this oxygen before they pay for pulsed POC.   Hopefully her prednisone will treat her condition and she will not need oxygen in the future. We will walk her again at the next visit and determine how her body has responded to treatment.

## 2024-02-02 NOTE — Telephone Encounter (Signed)
 Pt notified and verbalized understanding.

## 2024-02-03 MED ORDER — PANTOPRAZOLE SODIUM 40 MG PO TBEC: 40.0000 mg | DELAYED_RELEASE_TABLET | Freq: Every day | ORAL | 2 refills | Status: DC

## 2024-02-08 DIAGNOSIS — I872 Venous insufficiency (chronic) (peripheral): Secondary | ICD-10-CM | POA: Diagnosis not present

## 2024-02-20 ENCOUNTER — Telehealth: Payer: Self-pay

## 2024-02-20 NOTE — Telephone Encounter (Signed)
 [name] is a 89 y.o. female called in because she said the Trospium is causing her to have dry mouth. Pt said she wants to go back to using [facility]. Pt was reminded that the cost of the Gemtesa is $300. Pt said she will call when she decided what she is going to do. Pt was notified she can increase her water intake for th dry mouth until she decided what she wants to do.

## 2024-02-21 ENCOUNTER — Other Ambulatory Visit: Payer: Self-pay

## 2024-02-21 ENCOUNTER — Ambulatory Visit: Payer: Medicare Other

## 2024-02-21 DIAGNOSIS — I4821 Permanent atrial fibrillation: Secondary | ICD-10-CM | POA: Diagnosis not present

## 2024-02-21 MED ORDER — GEMTESA 75 MG PO TABS: 75.0000 mg | ORAL_TABLET | Freq: Every day | ORAL | 5 refills | Status: DC

## 2024-02-21 NOTE — Telephone Encounter (Signed)
 Pt called back and Gemtesa was sent to the pharmacy per the patients request

## 2024-02-22 LAB — CUP PACEART REMOTE DEVICE CHECK
Battery Remaining Longevity: 18 mo
Battery Voltage: 2.87 V
Brady Statistic AP VP Percent: 0 %
Brady Statistic AP VS Percent: 0 %
Brady Statistic AS VP Percent: 99.62 %
Brady Statistic AS VS Percent: 0.38 %
Brady Statistic RA Percent Paced: 0 %
Brady Statistic RV Percent Paced: 99.62 %
Date Time Interrogation Session: 20250310224153
Implantable Lead Connection Status: 753985
Implantable Lead Connection Status: 753985
Implantable Lead Implant Date: 20180905
Implantable Lead Implant Date: 20180905
Implantable Lead Location: 753859
Implantable Lead Location: 753860
Implantable Lead Model: 3830
Implantable Lead Model: 5076
Implantable Pulse Generator Implant Date: 20180905
Lead Channel Impedance Value: 266 Ohm
Lead Channel Impedance Value: 323 Ohm
Lead Channel Impedance Value: 380 Ohm
Lead Channel Impedance Value: 532 Ohm
Lead Channel Pacing Threshold Amplitude: 0.375 V
Lead Channel Pacing Threshold Pulse Width: 0.4 ms
Lead Channel Sensing Intrinsic Amplitude: 1.5 mV
Lead Channel Sensing Intrinsic Amplitude: 2.875 mV
Lead Channel Sensing Intrinsic Amplitude: 3 mV
Lead Channel Sensing Intrinsic Amplitude: 3.125 mV
Lead Channel Setting Pacing Amplitude: 2.5 V
Lead Channel Setting Pacing Pulse Width: 1 ms
Lead Channel Setting Sensing Sensitivity: 0.9 mV
Zone Setting Status: 755011

## 2024-03-01 ENCOUNTER — Encounter (HOSPITAL_BASED_OUTPATIENT_CLINIC_OR_DEPARTMENT_OTHER): Payer: Self-pay | Admitting: Pulmonary Disease

## 2024-03-01 ENCOUNTER — Ambulatory Visit (HOSPITAL_BASED_OUTPATIENT_CLINIC_OR_DEPARTMENT_OTHER): Payer: Medicare Other | Admitting: Pulmonary Disease

## 2024-03-01 VITALS — BP 118/72 | HR 92 | Ht 67.5 in | Wt 160.4 lb

## 2024-03-01 DIAGNOSIS — J9601 Acute respiratory failure with hypoxia: Secondary | ICD-10-CM | POA: Diagnosis not present

## 2024-03-01 DIAGNOSIS — J849 Interstitial pulmonary disease, unspecified: Secondary | ICD-10-CM

## 2024-03-01 NOTE — Progress Notes (Unsigned)
 Subjective:   PATIENT ID: [name] GENDER: female DOB: 1933-06-17, MRN: [id number]  Chief Complaint  Patient presents with   Follow-up    ILD    Reason for Visit: Follow-up  [name] is a 89 year old female with ILD, paroxysmal atrial fibrillation, mitral valve prolapse, hx DVT, hypothyroidism, hx breast cancer who presents for follow-up  02/01/24 Seen for hospital follow-up. Discharge summary reviewed. She was discharged on 2L on 12/12/23 with prednisone taper. Previously followed by Dr. Marchelle Gearing for NSIP. Was not on oxygen prior to admission. Question of amiodarone toxicity. Compliant with oxygen and Symbicort. Has felt breathing has been stable. Denies cough or wheezing. Weaned off steroids. Has not been monitoring O2 levels at home.  03/01/24 Since our last visit she was treated for ILD exacerbation with prednisone taper. She currently is 30 mg daily dosing currently. Breathing has been stable. Able to perform ADLs and cook around the house. Her reflux has been increased but has not been taking her PPI consistently. She is compliant with her oxygen on 5L.  Past Medical History:  Diagnosis Date   Allergic rhinitis    Arthritis    bil knees   Breast cancer, left breast (HCC) 01/15/2015   Invasive Mammary   Chronic venous insufficiency    Hematuria    negative workup - Dr. Wanda Plump   Hypertension    Hypothyroidism    Migraine headache    MVP (mitral valve prolapse)    OAB (overactive bladder)    Pacemaker    PAF (paroxysmal atrial fibrillation) (HCC)    SVT (supraventricular tachycardia) (HCC) 06/2010   s/p AVNRT ablation   Wears glasses      Family History  Problem Relation Age of Onset   Hypertension Mother    Heart disease Mother    Arthritis Mother    Other Father        hardening of the arteries   Heart Problems Sister        pacemaker   Heart attack Brother    Throat cancer Paternal Uncle    Breast cancer Sister        early 42s    Alzheimer's disease Sister    Pulmonary disease Sister    Breast cancer Daughter 61       Negative genetic testing    Breast cancer Paternal Aunt        dx over 46   Cancer Paternal Aunt        cancer in her leg   Breast cancer Cousin        multiple paternal cousin     Social History   Occupational History   Not on file  Tobacco Use   Smoking status: Never   Smokeless tobacco: Never   Tobacco comments:    did smoke about 2cigs long time about 54 plus years ago  Vaping Use   Vaping status: Never Used  Substance and Sexual Activity   Alcohol use: Yes    Comment: glass of wine nightly   Drug use: No   Sexual activity: Not Currently    Birth control/protection: Surgical    Allergies  Allergen Reactions   Adhesive [Tape] Rash and Other (See Comments)    Including EKG electrodes and the like   Codeine Other (See Comments)    Reaction not recalled   Latex Itching   Oxycodone-Aspirin Nausea And Vomiting   Percodan [Oxycodone-Aspirin] Nausea And Vomiting   Quinolones     Patient  with ATAA (ascending thoracic aortic aneurysm)     Outpatient Medications Prior to Visit  Medication Sig Dispense Refill   acetaminophen (TYLENOL) 325 MG tablet Take 2 tablets (650 mg total) by mouth every 6 (six) hours as needed for mild pain (pain score 1-3) (or Fever >/= 101). 20 tablet 0   amiodarone (PACERONE) 100 MG tablet Take 1 tablet (100 mg total) by mouth at bedtime. 30 tablet 0   apixaban (ELIQUIS) 5 MG TABS tablet Take 1 tablet (5 mg total) by mouth 2 (two) times daily. 200 tablet 1   budesonide-formoterol (SYMBICORT) 160-4.5 MCG/ACT inhaler Inhale 2 puffs into the lungs 2 (two) times daily. 1 each 5   Carboxymethylcellul-Glycerin (LUBRICATING EYE DROPS OP) Place 1 drop into both eyes 3 (three) times daily as needed (for dryness).     levothyroxine (SYNTHROID) 125 MCG tablet Take 125 mcg by mouth daily before breakfast.     losartan (COZAAR) 50 MG tablet Take 1 tablet (50 mg total) by  mouth daily. 90 tablet 3   metoprolol succinate (TOPROL-XL) 25 MG 24 hr tablet Take 1 tablet (25 mg total) by mouth daily. 30 tablet 11   MUCINEX 600 MG 12 hr tablet Take 600 mg by mouth 2 (two) times daily as needed for cough or to loosen phlegm.     mupirocin ointment (BACTROBAN) 2 % Apply 1 Application topically See admin instructions. Apply to affected area/ulcer on the right leg once a day     ondansetron (ZOFRAN) 4 MG tablet Take 1 tablet (4 mg total) by mouth every 6 (six) hours as needed for nausea. 20 tablet 0   pantoprazole (PROTONIX) 40 MG tablet Take 1 tablet (40 mg total) by mouth daily. Take while on prednisone 30 tablet 2   predniSONE (DELTASONE) 10 MG tablet Take 5 tablets (50 mg total) by mouth daily with breakfast for 14 days, THEN 4 tablets (40 mg total) daily with breakfast for 14 days, THEN 3 tablets (30 mg total) daily with breakfast for 14 days, THEN 2 tablets (20 mg total) daily with breakfast for 14 days, THEN 1 tablet (10 mg total) daily with breakfast for 14 days. 210 tablet 0   Spacer/Aero-Holding Chambers (AEROCHAMBER PLUS) Device Needs Aerochamber with Inhalers 1 each 0   triamcinolone cream (KENALOG) 0.1 % Apply 1 Application topically daily as needed (for rashes- affected areas).     Vibegron (GEMTESA) 75 MG TABS Take 1 tablet (75 mg total) by mouth daily. 30 tablet 5   Vitamin D, Ergocalciferol, (DRISDOL) 50000 units CAPS capsule Take 50,000 Units by mouth every Thursday.     No facility-administered medications prior to visit.    Review of Systems  Constitutional:  Negative for chills, diaphoresis, fever, malaise/fatigue and weight loss.  HENT:  Negative for congestion.   Respiratory:  Negative for cough, hemoptysis, sputum production, shortness of breath and wheezing.   Cardiovascular:  Negative for chest pain, palpitations and leg swelling.  Gastrointestinal:  Positive for heartburn.     Objective:   Vitals:   03/01/24 1306  BP: 118/72  Pulse: 92   SpO2: 98%  Weight: 160 lb 6.4 oz (72.8 kg)  Height: 5' 7.5" (1.715 m)    SpO2: 98 %  Physical Exam: General: Well-appearing, no acute distress HENT: [place], AT Eyes: EOMI, no scleral icterus Respiratory: Diminished but clear to auscultation bilaterally.  No crackles, wheezing or rales Cardiovascular: RRR, -M/R/G, no JVD Extremities:-Edema,-tenderness Neuro: AAO x4, CNII-XII grossly intact Psych: Normal mood, normal affect  Data Reviewed:  Imaging: CT Chest 12/09/23 - Progression of subpleural scarring and septal thickening, scatted GGO. Mild cylindrical bronchiectasis. Trace bilateral effusions  PFT: 02/01/24 FVC 1.32 (52%) FEV1 1.02 (54%) Ratio 77  TLC 77% DLCO Unable to obtain  Interpretation: Moderately severe restrictive defect  Labs: CBC    Component Value Date/Time   WBC 8.7 12/12/2023 0453   RBC 4.31 12/12/2023 0453   RBC 4.13 12/12/2023 0453   HGB 12.7 12/12/2023 0453   HGB 11.8 04/14/2022 1216   HGB 11.1 (L) 02/17/2015 1054   HCT 41.0 12/12/2023 0453   HCT 34.8 04/14/2022 1216   HCT 34.3 (L) 02/17/2015 1054   PLT 168 12/12/2023 0453   PLT 204 04/14/2022 1216   MCV 95.1 12/12/2023 0453   MCV 86 04/14/2022 1216   MCV 87.6 02/17/2015 1054   MCH 29.5 12/12/2023 0453   MCHC 31.0 12/12/2023 0453   RDW 15.1 12/12/2023 0453   RDW 14.2 04/14/2022 1216   RDW 13.5 02/17/2015 1054   LYMPHSABS 0.9 12/12/2023 0453   LYMPHSABS 1.1 03/27/2021 1443   LYMPHSABS 1.3 02/17/2015 1054   MONOABS 0.6 12/12/2023 0453   MONOABS 0.4 02/17/2015 1054   EOSABS 0.0 12/12/2023 0453   EOSABS 0.1 03/27/2021 1443   BASOSABS 0.0 12/12/2023 0453   BASOSABS 0.0 03/27/2021 1443   BASOSABS 0.0 02/17/2015 1054   02/01/24 SATURATION QUALIFICATIONS: (This note is used to comply with regulatory documentation for home oxygen)   Patient Saturations on Room Air at Rest = 98%   Patient Saturations on Room Air while Ambulating = 87%   Patient Saturations on 5 Liters of oxygen while Ambulating  = 98%   Please briefly explain why patient needs home oxygen: Patient needs 5 liters of oxygen while ambulating to reach 98%.     Assessment & Plan:   Discussion: 89 year old female with ILD, paroxysmal atrial fibrillation, mitral valve prolapse, hx DVT, hypothyroidism, hx breast cancer who presents for follow-up. S/p treated for ILD exacerbation.  In active ILD exacerbation with increased O2 requirement after being weaned off prednisone. Discussed prolonged taper and oxygen compliance.  Acute on chronic hypoxemic respiratory failure Ambulatory O2 today with no desaturations Wear 1-2 L oxygen as needed for goal SpO2 >88% DME: [facility]  ILD exacerbation Reviewed PFTs with moderately severe restriction CONTINUE prednisone taper as prescribed. Decrease by 10 mg every two weeks START PPI while on prednisone. Can take twice a day if needed CONTINUE Symbicort 160-4.5 mcg TWO puffs in the morning and evening.    Health Maintenance Immunization History  Administered Date(s) Administered   Influenza Split 10/08/2009, 09/15/2010, 10/28/2011, 10/31/2012, 09/27/2013, 09/17/2015   Influenza, High Dose Seasonal PF 09/06/2014, 09/22/2015, 08/31/2017, 08/29/2018, 08/09/2019, 08/21/2019, 09/02/2020, 08/25/2021   Influenza,inj,Quad PF,6+ Mos 09/12/2016   PFIZER(Purple Top)SARS-COV-2 Vaccination 01/02/2020, 01/23/2020, 09/22/2020, 06/11/2021   Pneumococcal Conjugate-13 09/06/2014   Pneumococcal Polysaccharide-23 09/12/2002, 06/20/2017   Td 12/13/2005   Tdap 06/07/2016   Zoster, Live 08/14/2008, 08/19/2020, 12/19/2020   CT Lung Screen- not qualified  No orders of the defined types were placed in this encounter.  No orders of the defined types were placed in this encounter.   No follow-ups on file.  I have spent a total time of***-minutes on the day of the appointment including chart review, data review, collecting history, coordinating care and discussing medical diagnosis and plan with the  patient/family. Past medical history, allergies, medications were reviewed. Pertinent imaging, labs and tests included in this note have been reviewed and interpreted  independently by me.  Reichen Hutzler Mechele Collin, MD [facility] 03/01/2024 1:11 PM

## 2024-03-01 NOTE — Patient Instructions (Addendum)
 Acute on chronic hypoxemic respiratory failure Ambulatory O2 today with no desaturations Wear 1-2 L oxygen as needed for goal SpO2 >88% DME: [facility]  ILD exacerbation Reviewed PFTs with moderately severe restriction CONTINUE prednisone taper as prescribed. Decrease by 10 mg every two weeks START PPI while on prednisone. Can take twice a day if needed CONTINUE Symbicort 160-4.5 mcg TWO puffs in the morning and evening.

## 2024-03-02 ENCOUNTER — Encounter (HOSPITAL_BASED_OUTPATIENT_CLINIC_OR_DEPARTMENT_OTHER): Payer: Self-pay | Admitting: Pulmonary Disease

## 2024-03-13 DIAGNOSIS — S81801A Unspecified open wound, right lower leg, initial encounter: Secondary | ICD-10-CM | POA: Diagnosis not present

## 2024-03-14 ENCOUNTER — Other Ambulatory Visit: Payer: Self-pay | Admitting: Internal Medicine

## 2024-03-14 ENCOUNTER — Ambulatory Visit
Admission: RE | Admit: 2024-03-14 | Discharge: 2024-03-14 | Disposition: A | Discharge status: Home / Self Care | Source: Ambulatory Visit | Attending: Internal Medicine | Admitting: Internal Medicine

## 2024-03-14 DIAGNOSIS — R410 Disorientation, unspecified: Secondary | ICD-10-CM

## 2024-03-14 DIAGNOSIS — J90 Pleural effusion, not elsewhere classified: Secondary | ICD-10-CM | POA: Diagnosis not present

## 2024-03-19 ENCOUNTER — Ambulatory Visit: Payer: Medicare Other | Admitting: Obstetrics

## 2024-03-19 ENCOUNTER — Telehealth: Payer: Self-pay

## 2024-03-19 ENCOUNTER — Other Ambulatory Visit (HOSPITAL_COMMUNITY): Payer: Self-pay | Admitting: Internal Medicine

## 2024-03-19 DIAGNOSIS — R410 Disorientation, unspecified: Secondary | ICD-10-CM

## 2024-03-19 NOTE — Telephone Encounter (Signed)
 Husband of [name] is a 89 y.o. female called in to let Dr. Olena Leatherwood know that the pt has been diagnosed with Alzheimer's and is getting an MRI on 03/20/24.   [name] wants to call all of her Dr.'s to let them know.

## 2024-03-20 ENCOUNTER — Ambulatory Visit (HOSPITAL_COMMUNITY)
Admission: RE | Admit: 2024-03-20 | Discharge: 2024-03-20 | Disposition: A | Discharge status: Home / Self Care | Source: Ambulatory Visit | Attending: Internal Medicine | Admitting: Internal Medicine

## 2024-03-20 DIAGNOSIS — I6782 Cerebral ischemia: Secondary | ICD-10-CM | POA: Diagnosis not present

## 2024-03-20 DIAGNOSIS — R410 Disorientation, unspecified: Secondary | ICD-10-CM | POA: Diagnosis not present

## 2024-03-20 DIAGNOSIS — G319 Degenerative disease of nervous system, unspecified: Secondary | ICD-10-CM | POA: Diagnosis not present

## 2024-03-27 ENCOUNTER — Other Ambulatory Visit (HOSPITAL_COMMUNITY)
Admission: RE | Admit: 2024-03-27 | Discharge: 2024-03-27 | Disposition: A | Discharge status: Home / Self Care | Source: Other Acute Inpatient Hospital | Attending: Obstetrics | Admitting: Obstetrics

## 2024-03-27 ENCOUNTER — Ambulatory Visit: Payer: Medicare Other | Admitting: Obstetrics

## 2024-03-27 ENCOUNTER — Encounter: Payer: Self-pay | Admitting: Obstetrics

## 2024-03-27 VITALS — BP 126/81 | HR 71

## 2024-03-27 DIAGNOSIS — R8290 Unspecified abnormal findings in urine: Secondary | ICD-10-CM | POA: Diagnosis not present

## 2024-03-27 DIAGNOSIS — N3946 Mixed incontinence: Secondary | ICD-10-CM

## 2024-03-27 DIAGNOSIS — N952 Postmenopausal atrophic vaginitis: Secondary | ICD-10-CM

## 2024-03-27 DIAGNOSIS — R351 Nocturia: Secondary | ICD-10-CM | POA: Insufficient documentation

## 2024-03-27 LAB — POCT URINALYSIS DIPSTICK
Blood, UA: NEGATIVE
Glucose, UA: NEGATIVE
Ketones, UA: NEGATIVE
Leukocytes, UA: NEGATIVE
Nitrite, UA: NEGATIVE
Protein, UA: POSITIVE — AB
Spec Grav, UA: 1.02 (ref 1.010–1.025)
Urobilinogen, UA: 1 U/dL
pH, UA: 5.5 (ref 5.0–8.0)

## 2024-03-27 MED ORDER — ESTRADIOL 0.1 MG/GM VA CREA
TOPICAL_CREAM | VAGINAL | 3 refills | Status: DC
Start: 2024-03-29 — End: 2024-03-27

## 2024-03-27 MED ORDER — GEMTESA 75 MG PO TABS: 75.0000 mg | ORAL_TABLET | Freq: Every day | ORAL | 2 refills | Status: DC

## 2024-03-27 MED ORDER — ESTRADIOL 0.1 MG/GM VA CREA: TOPICAL_CREAM | VAGINAL | 3 refills | Status: AC

## 2024-03-27 MED ORDER — GEMTESA 75 MG PO TABS: 75.0000 mg | ORAL_TABLET | Freq: Every day | ORAL | Status: DC

## 2024-03-27 NOTE — Assessment & Plan Note (Signed)
-   previously avoided vaginal estrogen due to recurrent ER+ breast cancer - reviewed vulvovaginal moisturizer Options: - Vitamin E oil (pump or capsule) or cream (Gene's Vit E Cream) - vaginal moisturizers  - concerns with UTI and cognitive impact, start low dose vaginal estrogen to reduce UTI

## 2024-03-27 NOTE — Assessment & Plan Note (Addendum)
-   prior 10/07/23 clean catch POCT UA with + heme, urine culture > 100K proteus resistant to macrobid. Treated with Cipro with improvement of urinary symptoms - repeat catheterized UA+ bili/protein only  - denies gross hematuria, pelvic radiation, or history of GU cancers - remote history of tobacco use  - trial of low dose vaginal estrogen and consider UTI suppression if she continues to experience improvement of cognitive function after antibiotic treatment. However husband reports fluctuating cognitive function despite antibiotic use.

## 2024-03-27 NOTE — Assessment & Plan Note (Signed)
-   We previously discussed the symptoms of overactive bladder (OAB), which include urinary urgency, urinary frequency, nocturia, with or without urge incontinence.  While we do not know the exact etiology of OAB, several treatment options exist. We discussed management including behavioral therapy (decreasing bladder irritants, urge suppression strategies, timed voids, bladder retraining), physical therapy, medication; for refractory cases posterior tibial nerve stimulation, sacral neuromodulation, and intravesical botulinum toxin injection.  For anticholinergic medications, we discussed the potential side effects of anticholinergics including dry eyes, dry mouth, constipation, cognitive impairment and urinary retention. For Beta-3 agonist medication, we discussed the potential side effect of elevated blood pressure which is more likely to occur in individuals with uncontrolled hypertension. - mirabegron with relief however cost prohibitive.  - Switch to gemtesa with samples provided and Rx ($100), pt previously discontinued around the time of UTI. Resumed with relief, but discontinued at time of confusion due to concerns of UTI. No change in confusion since discontinuation. Rx resent to pharmacy with samples provided. Consider Trospium if cost prohibitive.  - reviewed 3rd line therapy.  For refractory OAB we reviewed the procedure for intravesical Botox injection with cystoscopy in the office and reviewed the risks, benefits and alternatives of treatment including but not limited to infection, need for self-catheterization and need for repeat therapy.  We discussed that there is a 5-15% chance of needing to catheterize with Botox and that this usually resolves in a few months; however can persist for longer periods of time.  Typically Botox injections would need to be repeated every 3-12 months since this is not a permanent therapy.   We discussed the role of sacral neuromodulation and how it works. It  requires a test phase, and documentation of bladder function via diary. After a successful test period, a permanent wire and generator are placed in the OR. The battery lasts 5 years on average and would need to be replaced surgically.  The goal of this therapy is at least a 50% improvement in symptoms. It is NOT realistic to expect a 100% cure.  We reviewed the fact that about 30% of patients fail the test phase and are not candidates for permanent generator placement.  We discussed the risk of infection and that the patient would not be able to get an MRI once the device is placed. There are two companies that provide this therapy: Medtronic and [employer]. [employer]' product is new and is similar to Medtronic's, but has advantages of a smaller and rechargeable battery and being able to have an MRI with the implant. For all procedures, we discussed risks of bleeding, infection, damage to surrounding organs including bowel, bladder, blood vessels, ureters and nerves, need for further surgery, risk of postoperative urinary incontinence or retention with need to catheterize, recurrent prolapse, numbness and weakness at any body site, buttock pain, and the rarer risks of blood clot, heart attack, pneumonia, death.    We also discussed the role of percutaneous tibial nerve stimulation and how it works.  She understands it requires 12 weekly visits for temporary neuromodulation of the sacral nerve roots via the tibial nerve and that she may then require continued tapered treatment.   - discussed residual SUI that may require additional treatment.  - postvoid residual WNL at 1st visit, repeat at 20mL by catheterization.

## 2024-03-27 NOTE — Patient Instructions (Addendum)
 We discussed the symptoms of overactive bladder (OAB), which include urinary urgency, urinary frequency, night-time urination, with or without urge incontinence.  We discussed management including behavioral therapy (decreasing bladder irritants by following a bladder diet, urge suppression strategies, timed voids, bladder retraining), physical therapy, medication; and for refractory cases posterior tibial nerve stimulation, sacral neuromodulation, and intravesical botulinum toxin injection.   For Beta-3 agonist medication, we discussed the potential side effect of elevated blood pressure which is more likely to occur in individuals with uncontrolled hypertension. You were given samples for Gemtesa 75 mg.  It can take a month to start working so give it time, but if you have bothersome side effects call sooner and we can try a different medication.  Call us  if you have trouble filling the prescription or if it's not covered by your insurance.  We will call you regarding your urine testing results.   Please call your primary care provider regarding your brain MRI results and next steps.  For vaginal atrophy (thinning of the vaginal tissue that can cause dryness and burning) and UTI prevention we discussed estrogen replacement in the form of vaginal cream.   Start vaginal estrogen therapy nightly for two weeks then 2 times weekly at night. This can be placed with your finger or an applicator inside the vagina and around the urethra.  Please let us  know if the prescription is too expensive and we can look for alternative options.   Is vaginal estrogen therapy safe for me? Vaginal estrogen preparations act on the vaginal skin, and only a very tiny amount is absorbed into the bloodstream (0.01%).  They work in a similar way to hand or face cream.  There is minimal absorption and they are therefore perfectly safe. If you have had breast cancer and have persistent troublesome symptoms which aren't settling  with vaginal moisturisers and lubricants, local estrogen treatment may be a possibility, but consultation with your oncologist should take place first.

## 2024-03-27 NOTE — Assessment & Plan Note (Signed)
-   most bothersome per patient due to night time leakage, nocturia previously resolved with Gemtesa - avoid fluid intake after 6pm - elevated feet during the day and continue to use compression socks to reduce lower extremity swelling - encouraged to resume trial of gemtesa to assess symptoms, reviewed with patient and her husband regarding need to discontinue if she experiences any change in cognitive function after resumption of Gemtesa - if symptoms refractory to OAB treatment, encouraged workup for sleep apnea with referral for study study sent. Previously provided number for patient to call and schedule appt

## 2024-03-27 NOTE — Progress Notes (Signed)
 [facility] Return Visit  SUBJECTIVE  History of Present Illness: [name] is a 89 y.o. female seen in follow-up for  mixed urinary incontinence, nocturia, vaginal atrophy . Plan at last visit was resumption of gemtesa.   Stopped Gemtesa due to continual confusion around 1 month ago. Reports negative workup with PCP regarding UTI and labwork. Reports completion of antibiotics for presumed UTI around 1 week ago.  Denies improvement of confusion since discontinuation of Gemtesa.  Patient presented to restroom 3x during visit due to urge to void, however unable to void. Denies UTI symptoms such as dysuria, hematuria, back pain or N/V.  Previously urinary symptoms improved after restarting Gemtesa Reports voids 0-1x/night on Gemtesa, down from 1-4x/night. Now returned to 2x/night since stopping Gemtesa Tried Ditropan XL 10mg  switched to Vesicare due to dry mouth. Now on mirabegron 50mg  with relief but cost prohibitive ($115 for 3 month supply)  Uses 1 pad/day, prior pad use 1-2x/week at night down from baseline 2 pads/day Leaks 1-2/day down from 10x/day improved after treatment of presumed UTI 10/07/23 with Cipro  Prior bladder diary with voids every 3 hours  Trospium caused dry mouth. Prior patient at [facility] managed by Dr. Randall An, prior cystoscopy with "few flakes" in 12/10/21. Last seen 06/08/23  Recurrent left breast ER/PR positive, HER2 negative invasive lobular carcinoma s/p lumpectomy at [facility] in 2023   Stat MRI 03/20/24 CLINICAL DATA:  Provided history: Disorientation.   EXAM: MRI HEAD WITHOUT CONTRAST   TECHNIQUE: Multiplanar, multiecho pulse sequences of the brain and surrounding structures were obtained without intravenous contrast.   COMPARISON:  Head CT 10/18/2022.  Brain MRI 12/16/2015.   FINDINGS: Brain:   Moderate generalized cerebral atrophy.   Chronic lacunar infarct again demonstrated within the right retrolenticular white matter.    Multifocal T2 FLAIR hyperintense signal abnormality elsewhere within the cerebral white matter and pons, nonspecific but compatible with moderate-to-advanced chronic small vessel ischemic disease. Chronic small vessel ischemic changes also present within the thalami.   Prominent perivascular spaces within/about the bilateral deep gray nuclei.   Chronic microhemorrhages within the anterior right frontal lobe and right thalamus, new from the prior MRI.   Small chronic infarcts within the right cerebellar hemisphere, new from the prior MRI.   Partially empty sella turcica.   There is no acute infarct.   No evidence of an intracranial mass.   No extra-axial fluid collection.   No midline shift.   Vascular: Maintained flow voids within the proximal large arterial vessels.   Skull and upper cervical spine: No focal worrisome marrow lesion. Incompletely assessed cervical spondylosis.   Sinuses/Orbits: No mass or acute finding within the imaged orbits. Minimal mucosal thickening scattered within the paranasal sinuses.   IMPRESSION: 1.  No evidence of an acute intracranial abnormality. 2. Chronic lacunar infarct within the right retrolenticular white matter, unchanged from the prior brain MRI of 12/16/2015. 3. Background moderate-to-advanced chronic small vessel ischemic changes within the cerebral white matter and pons, progressed. 4. Chronic small vessel ischemic changes also present within the thalami. 5. Small chronic infarcts within the right cerebellar hemisphere, new from the prior MRI. 6. Moderate generalized cerebral atrophy, progressed.     Electronically Signed   By: Jackey Loge D.O.   On: 03/20/2024 10:11"  Past Medical History: Patient  has a past medical history of Allergic rhinitis, Arthritis, Breast cancer, left breast (HCC) (01/15/2015), Chronic venous insufficiency, Hematuria, Hypertension, Hypothyroidism, Migraine headache, MVP (mitral valve prolapse), OAB  (overactive bladder), Pacemaker,  PAF (paroxysmal atrial fibrillation) (HCC), SVT (supraventricular tachycardia) (HCC) (06/2010), and Wears glasses.   Past Surgical History: She  has a past surgical history that includes Left Breast Biopsy (01/15/15); Breast surgery (1990); Abdominal hysterectomy; Tubal ligation; Dilation and curettage of uterus; Colonoscopy; Cardiac catheterization (2011); Breast lumpectomy with radioactive seed localization (Left, 02/14/2015); A-FLUTTER ABLATION (N/A, 02/04/2017); Cardioversion (N/A, 08/10/2017); PACEMAKER IMPLANT (N/A, 08/17/2017); AV NODE ABLATION (N/A, 08/17/2017); and Breast lumpectomy with radioactive seed localization (Left, 11/18/2021).   Medications: She has a current medication list which includes the following prescription(s): acetaminophen, apixaban, budesonide-formoterol, polyethyl glycol-propyl glycol, levothyroxine, losartan, metoprolol succinate, mucinex, mupirocin ointment, ondansetron, pantoprazole, prednisone, aerochamber plus, triamcinolone cream, gemtesa, gemtesa, vitamin d (ergocalciferol), and [START ON 03/29/2024] estradiol.   Allergies: Patient is allergic to adhesive [tape], codeine, latex, oxycodone-aspirin, percodan [oxycodone-aspirin], and quinolones.   Social History: Patient  reports that she has never smoked. She has never used smokeless tobacco. She reports current alcohol use. She reports that she does not use drugs.     OBJECTIVE     Physical Exam: Vitals:   03/27/24 0914  BP: 126/81  Pulse: 71   Gen: No apparent distress, A&O x 3.  Detailed Urogynecologic Evaluation:  Deferred. Prior exam showed: Vaginal atrophy with urethral caruncle  Straight Catheterization Procedure for PVR: After verbal consent was obtained from the patient for catheterization to assess bladder emptying and residual volume the urethra and surrounding tissues were visualized and an in and out catheterization was performed.  PVR was 20mL.  Urine appeared dark  yellow. The patient tolerated the procedure well.  Lab Results  Component Value Date   COLORU Yellow 03/27/2024   CLARITYU Clear 03/27/2024   GLUCOSEUR Negative 03/27/2024   BILIRUBINUR Small 03/27/2024   KETONESU Negative 03/27/2024   SPECGRAV 1.020 03/27/2024   RBCUR Negative 03/27/2024   PHUR 5.5 03/27/2024   PROTEINUR Positive (A) 03/27/2024   UROBILINOGEN 1.0 03/27/2024   LEUKOCYTESUR Negative 03/27/2024         ASSESSMENT AND PLAN    Ms. Martinson is a 89 y.o. with:  1. Mixed stress and urge urinary incontinence   2. Nocturia   3. Vaginal atrophy   4. Abnormal finding on urinalysis     Mixed stress and urge urinary incontinence Assessment & Plan: - We previously discussed the symptoms of overactive bladder (OAB), which include urinary urgency, urinary frequency, nocturia, with or without urge incontinence.  While we do not know the exact etiology of OAB, several treatment options exist. We discussed management including behavioral therapy (decreasing bladder irritants, urge suppression strategies, timed voids, bladder retraining), physical therapy, medication; for refractory cases posterior tibial nerve stimulation, sacral neuromodulation, and intravesical botulinum toxin injection.  For anticholinergic medications, we discussed the potential side effects of anticholinergics including dry eyes, dry mouth, constipation, cognitive impairment and urinary retention. For Beta-3 agonist medication, we discussed the potential side effect of elevated blood pressure which is more likely to occur in individuals with uncontrolled hypertension. - mirabegron with relief however cost prohibitive.  - Switch to gemtesa with samples provided and Rx ($100), pt previously discontinued around the time of UTI. Resumed with relief, but discontinued at time of confusion due to concerns of UTI. No change in confusion since discontinuation. Rx resent to pharmacy with samples provided. Consider Trospium  if cost prohibitive.  - reviewed 3rd line therapy.  For refractory OAB we reviewed the procedure for intravesical Botox injection with cystoscopy in the office and reviewed the risks, benefits and alternatives of treatment  including but not limited to infection, need for self-catheterization and need for repeat therapy.  We discussed that there is a 5-15% chance of needing to catheterize with Botox and that this usually resolves in a few months; however can persist for longer periods of time.  Typically Botox injections would need to be repeated every 3-12 months since this is not a permanent therapy.   We discussed the role of sacral neuromodulation and how it works. It requires a test phase, and documentation of bladder function via diary. After a successful test period, a permanent wire and generator are placed in the OR. The battery lasts 5 years on average and would need to be replaced surgically.  The goal of this therapy is at least a 50% improvement in symptoms. It is NOT realistic to expect a 100% cure.  We reviewed the fact that about 30% of patients fail the test phase and are not candidates for permanent generator placement.  We discussed the risk of infection and that the patient would not be able to get an MRI once the device is placed. There are two companies that provide this therapy: Medtronic and [employer]. [employer]' product is new and is similar to Medtronic's, but has advantages of a smaller and rechargeable battery and being able to have an MRI with the implant. For all procedures, we discussed risks of bleeding, infection, damage to surrounding organs including bowel, bladder, blood vessels, ureters and nerves, need for further surgery, risk of postoperative urinary incontinence or retention with need to catheterize, recurrent prolapse, numbness and weakness at any body site, buttock pain, and the rarer risks of blood clot, heart attack, pneumonia, death.    We also discussed the role of  percutaneous tibial nerve stimulation and how it works.  She understands it requires 12 weekly visits for temporary neuromodulation of the sacral nerve roots via the tibial nerve and that she may then require continued tapered treatment.   - discussed residual SUI that may require additional treatment.  - postvoid residual WNL at 1st visit, repeat at 20mL by catheterization.   Orders: Leslye Peer; Take 1 tablet (75 mg total) by mouth daily. Leslye Peer; Take 1 tablet (75 mg total) by mouth daily.  Dispense: 30 tablet; Refill: 2 -     POCT urinalysis dipstick  Nocturia Assessment & Plan: - most bothersome per patient due to night time leakage, nocturia previously resolved with Gemtesa - avoid fluid intake after 6pm - elevated feet during the day and continue to use compression socks to reduce lower extremity swelling - encouraged to resume trial of gemtesa to assess symptoms, reviewed with patient and her husband regarding need to discontinue if she experiences any change in cognitive function after resumption of Gemtesa - if symptoms refractory to OAB treatment, encouraged workup for sleep apnea with referral for study study sent. Previously provided number for patient to call and schedule appt  Orders: -     Gemtesa; Take 1 tablet (75 mg total) by mouth daily. Leslye Peer; Take 1 tablet (75 mg total) by mouth daily.  Dispense: 30 tablet; Refill: 2 -     Urine Culture; Future -     POCT urinalysis dipstick  Vaginal atrophy Assessment & Plan: - previously avoided vaginal estrogen due to recurrent ER+ breast cancer - reviewed vulvovaginal moisturizer Options: - Vitamin E oil (pump or capsule) or cream (Gene's Vit E Cream) - vaginal moisturizers  -  concerns with UTI and cognitive impact, start low dose vaginal estrogen to reduce UTI  Orders: -     Estradiol; Place 0.5g nightly for two weeks then twice a week after  Dispense: 30 g; Refill: 3  Abnormal finding on  urinalysis Assessment & Plan: - prior 10/07/23 clean catch POCT UA with + heme, urine culture > 100K proteus resistant to macrobid. Treated with Cipro with improvement of urinary symptoms - repeat catheterized UA+ bili/protein only  - denies gross hematuria, pelvic radiation, or history of GU cancers - remote history of tobacco use  - trial of low dose vaginal estrogen and consider UTI suppression if she continues to experience improvement of cognitive function after antibiotic treatment. However husband reports fluctuating cognitive function despite antibiotic use.    Time spent: I spent 41 minutes dedicated to the care of this patient on the date of this encounter to include pre-visit review of records, face-to-face time with the patient discussing mixed urinary incontinence, abnormal UA, vaginal atrophy, nocturia, and post visit documentation and ordering medication/ testing.   Darlene Ehlers, MD

## 2024-03-28 LAB — URINE CULTURE: Culture: NO GROWTH

## 2024-03-29 ENCOUNTER — Telehealth (HOSPITAL_BASED_OUTPATIENT_CLINIC_OR_DEPARTMENT_OTHER): Payer: Self-pay

## 2024-03-29 NOTE — Telephone Encounter (Signed)
 Per Dr Washington Hacker last note pt should continue Symbicort. Pt notified   Copied from CRM [id number]. Topic: Clinical - Medication Question >> Mar 29, 2024  1:32 PM Eveleen Hinds B wrote: Reason for CRM: Patient's husband called. Patient is almost out of  budesonide-formoterol (SYMBICORT) 160-4.5 MCG/ACT inhaler. He has refills but he's not sure Dr Washington Hacker wants her to continue using it. Would like to talk to Dr Washington Hacker or nurse.

## 2024-04-03 ENCOUNTER — Ambulatory Visit: Payer: Medicare Other | Attending: Internal Medicine | Admitting: Internal Medicine

## 2024-04-03 ENCOUNTER — Encounter: Payer: Self-pay | Admitting: Internal Medicine

## 2024-04-03 VITALS — BP 102/62 | HR 72 | Ht 67.5 in | Wt 161.8 lb

## 2024-04-03 DIAGNOSIS — I493 Ventricular premature depolarization: Secondary | ICD-10-CM

## 2024-04-03 NOTE — Patient Instructions (Signed)
 Medication Instructions:  Your physician recommends that you continue on your current medications as directed. Please refer to the Current Medication list given to you today.  *If you need a refill on your cardiac medications before your next appointment, please call your pharmacy*  Lab Work: None ordered.  You may go to any [facility] Location for your lab work:  [facility] - [address] ([facility]) - [address] [phone] [address]  [place] - [address]   [place]  - [address]   [place] - [address] - [address] [employer]  - [address] [place] - [address] ([facility]   If you have labs (blood work) drawn today and your tests are completely normal, you will receive your results only by: [employer] (if you have MyChart)  If you have any lab test that is abnormal or we need to change your treatment, we will call you or send a MyChart message to review the results.  Testing/Procedures: None ordered.  Follow-Up: At [facility], you and your health needs are our priority.  As part of our continuing mission to provide you with exceptional heart care, we have created designated Provider Care Teams.  These Care Teams include your primary Cardiologist (physician) and Advanced Practice Providers (APPs -  Physician Assistants and Nurse Practitioners) who all work together to provide you with the care you need, when you need it.  Your next appointment:   1 year(s)  The format for your next appointment:   In Person  Provider:   Lewayne Bunting, MD{or one of the following Advanced Practice Providers on your designated Care Team:   Francis Dowse, [place] Casimiro Needle "Mardelle Matte" [place], [place] Earnest Rosier, NP  Note: Remote monitoring is used to monitor your Pacemaker/ ICD from home. This monitoring reduces the number of office visits required to check your  device to one time per year. It allows [place] to keep an eye on the functioning of your device to ensure it is working properly.            Valet parking services will be available as well.
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
## 2024-04-10 ENCOUNTER — Ambulatory Visit (HOSPITAL_BASED_OUTPATIENT_CLINIC_OR_DEPARTMENT_OTHER): Admitting: Pulmonary Disease

## 2024-04-10 NOTE — Addendum Note (Signed)
 Addended by: Lott Rouleau A on: 04/10/2024 09:45 AM   Modules accepted: Orders

## 2024-04-10 NOTE — Progress Notes (Signed)
 Remote pacemaker transmission.

## 2024-04-13 DIAGNOSIS — I5030 Unspecified diastolic (congestive) heart failure: Secondary | ICD-10-CM | POA: Diagnosis not present

## 2024-04-13 DIAGNOSIS — I872 Venous insufficiency (chronic) (peripheral): Secondary | ICD-10-CM | POA: Diagnosis not present

## 2024-04-17 DIAGNOSIS — I10 Essential (primary) hypertension: Secondary | ICD-10-CM | POA: Diagnosis not present

## 2024-04-17 DIAGNOSIS — H35033 Hypertensive retinopathy, bilateral: Secondary | ICD-10-CM | POA: Diagnosis not present

## 2024-04-17 DIAGNOSIS — H53143 Visual discomfort, bilateral: Secondary | ICD-10-CM | POA: Diagnosis not present

## 2024-04-17 DIAGNOSIS — H1849 Other corneal degeneration: Secondary | ICD-10-CM | POA: Diagnosis not present

## 2024-04-17 DIAGNOSIS — Z9849 Cataract extraction status, unspecified eye: Secondary | ICD-10-CM | POA: Diagnosis not present

## 2024-04-23 ENCOUNTER — Ambulatory Visit: Admitting: Podiatry

## 2024-04-23 ENCOUNTER — Encounter: Payer: Self-pay | Admitting: Podiatry

## 2024-04-23 VITALS — Ht 67.5 in | Wt 161.0 lb

## 2024-04-23 DIAGNOSIS — S91302A Unspecified open wound, left foot, initial encounter: Secondary | ICD-10-CM | POA: Diagnosis not present

## 2024-04-23 NOTE — Progress Notes (Unsigned)
 Ulcer LT bunion and RT 5th toe. Callus RT forefoot  *referred by [facility]

## 2024-04-26 DIAGNOSIS — L97811 Non-pressure chronic ulcer of other part of right lower leg limited to breakdown of skin: Secondary | ICD-10-CM | POA: Diagnosis not present

## 2024-04-26 DIAGNOSIS — I8312 Varicose veins of left lower extremity with inflammation: Secondary | ICD-10-CM | POA: Diagnosis not present

## 2024-04-26 DIAGNOSIS — I872 Venous insufficiency (chronic) (peripheral): Secondary | ICD-10-CM | POA: Diagnosis not present

## 2024-04-26 DIAGNOSIS — I8311 Varicose veins of right lower extremity with inflammation: Secondary | ICD-10-CM | POA: Diagnosis not present

## 2024-04-26 DIAGNOSIS — L97911 Non-pressure chronic ulcer of unspecified part of right lower leg limited to breakdown of skin: Secondary | ICD-10-CM | POA: Diagnosis not present

## 2024-04-26 DIAGNOSIS — L0889 Other specified local infections of the skin and subcutaneous tissue: Secondary | ICD-10-CM | POA: Diagnosis not present

## 2024-04-26 DIAGNOSIS — L308 Other specified dermatitis: Secondary | ICD-10-CM | POA: Diagnosis not present

## 2024-04-26 DIAGNOSIS — Z85828 Personal history of other malignant neoplasm of skin: Secondary | ICD-10-CM | POA: Diagnosis not present

## 2024-05-01 ENCOUNTER — Ambulatory Visit: Admitting: Adult Health

## 2024-05-01 VITALS — BP 100/60 | HR 86

## 2024-05-01 DIAGNOSIS — J9611 Chronic respiratory failure with hypoxia: Secondary | ICD-10-CM | POA: Insufficient documentation

## 2024-05-01 DIAGNOSIS — J849 Interstitial pulmonary disease, unspecified: Secondary | ICD-10-CM | POA: Diagnosis not present

## 2024-05-01 NOTE — Patient Instructions (Addendum)
 May Discontinue Oxygen.  CONTINUE Symbicort  160-4.5 mcg TWO puffs in the morning and evening- Rinse after use.  Activity as tolerated.  Follow up with Dr. Washington Hacker in 4 months with Spirometry with DLCO

## 2024-05-01 NOTE — Assessment & Plan Note (Signed)
 O2 sats are adequate on room air.  No desaturations with ambulation.  May discontinue oxygen.  We discussed setting up for an overnight oximetry test however patient has not used oxygen over 2 months and does not want to proceed with that.

## 2024-05-01 NOTE — Assessment & Plan Note (Addendum)
 Interstitial lung disease-NSIP pattern.  Exacerbation December 2024 suspicious for amiodarone  toxicity.  Clinically improved with steroids.  Patient is completely off steroids.  Now completely off of oxygen.  Appears to be at baseline.  No significant symptoms.  Will check spirometry with DLCO on return visit in 4 months.  Can decide at that time on future imaging as indicated Was started on Symbicort  during acute illness.  Unclear if still needed.  Advised patient that she may try couple days off of Symbicort  if no significant change can consider discontinuing.  If breathing worsens off Symbicort  remain on Symbicort  we  Plan  Patient Instructions  May Discontinue Oxygen.  CONTINUE Symbicort  160-4.5 mcg TWO puffs in the morning and evening- Rinse after use.  Activity as tolerated.  Follow up with Dr. Washington Hacker in 4 months with Spirometry with DLCO    Patient

## 2024-05-01 NOTE — Progress Notes (Signed)
 @Patient  ID: [name], female    DOB: [birth year], 89 y.o.   MRN: [id number]  Chief Complaint  Patient presents with   Follow-up    Referring provider: Tena Feeling, MD  HPI: 89 yo female never smoker followed for ILD-NSIP pattern and chronic respiratory failure on Oxygen (started 11/2023-at discharge) Medical history significant for A Fib , DVT , Breast cancer   TEST/EVENTS :  CT Chest 12/09/23 - Progression of subpleural scarring and septal thickening, scatted GGO. Mild cylindrical bronchiectasis. Trace bilateral effusions   PFT: 02/01/24 FVC 1.32 (52%) FEV1 1.02 (54%) Ratio 77  TLC 77% DLCO Unable to obtain  Interpretation: Moderately severe restrictive defect  02/01/24 SATURATION QUALIFICATIONS: (This note is used to comply with regulatory documentation for home oxygen)   Patient Saturations on Room Air at Rest = 98%   Patient Saturations on [employer] while Ambulating = 87%  Mar 01, 2024     DME: [facility]   POC SATURATION QUALIFICATIONS: (This note is used to comply with regulatory documentation for home oxygen)   Patient Saturations on Room Air at Rest = 95%   Patient Saturations on Room Air while Ambulating = 92%     Patient Saturations on 5 Liters of oxygen while Ambulating = 98%  05/01/2024 Follow up : ILD  Patient returns for a 3-month follow-up.  Patient was hospitalized in December with a interstitial lung disease exacerbation-questionable amiodarone  toxicity.  She was treated with steroids, oxygen and started on Symbicort .  Patient had clinical improvement at last visit in March patient no longer required oxygen.  O2 saturations at rest and with ambulation on room air with no desaturations.  Patient says she has not been on oxygen for the last 2 months and would like an order to discontinue.  O2 saturation today in office is 95% on room air after ambulating to the exam room.  Patient denies any cough or wheezing.  Says that she has no significant shortness of  breath.  She says she is very inactive and sedentary has more energy.  Has chronic leg pain she cannot walk long distances.  She says she is independent lives at home with her husband.  She does not drive any longer.  But is able to do cooking and very light house chores. Patient has tapered off of all steroids.  She had no flare of symptoms after being off her prednisone . Patient has no history of asthma or COPD.  She is a never smoker.   Allergies  Allergen Reactions   Adhesive [Tape] Rash and Other (See Comments)    Including EKG electrodes and the like   Codeine Other (See Comments)    Reaction not recalled   Latex Itching   Oxycodone-Aspirin Nausea And Vomiting   Percodan [Oxycodone-Aspirin] Nausea And Vomiting   Quinolones     Patient with ATAA (ascending thoracic aortic aneurysm)    Immunization History  Administered Date(s) Administered   Fluzone Influenza virus vaccine,trivalent (IIV3), split virus 09/17/2015   Influenza Split 10/08/2009, 09/15/2010, 10/28/2011, 10/31/2012, 09/27/2013   Influenza, High Dose Seasonal PF 09/06/2014, 09/22/2015, 08/31/2017, 08/29/2018, 08/09/2019, 08/21/2019, 09/02/2020, 08/25/2021   Influenza,inj,Quad PF,6+ Mos 09/12/2016   PFIZER(Purple Top)SARS-COV-2 Vaccination 01/02/2020, 01/23/2020, 09/22/2020, 06/11/2021   Pneumococcal Conjugate-13 09/06/2014   Pneumococcal Polysaccharide-23 09/12/2002, 06/20/2017   Td 12/13/2005   Tdap 06/07/2016   Zoster, Live 08/14/2008, 08/19/2020, 12/19/2020    Past Medical History:  Diagnosis Date   Allergic rhinitis    Arthritis    bil  knees   Breast cancer, left breast (HCC) 01/15/2015   Invasive Mammary   Chronic venous insufficiency    Hematuria    negative workup - Dr. Theotis Flake   Hypertension    Hypothyroidism    Migraine headache    MVP (mitral valve prolapse)    OAB (overactive bladder)    Pacemaker    PAF (paroxysmal atrial fibrillation) (HCC)    SVT (supraventricular tachycardia) (HCC)  06/2010   s/p AVNRT ablation   Wears glasses     Tobacco History: Social History   Tobacco Use  Smoking Status Never  Smokeless Tobacco Never  Tobacco Comments   did smoke about 2cigs long time about 54 plus years ago   Counseling given: Not Answered Tobacco comments: did smoke about 2cigs long time about 54 plus years ago   Outpatient Medications Prior to Visit  Medication Sig Dispense Refill   acetaminophen  (TYLENOL ) 325 MG tablet Take 2 tablets (650 mg total) by mouth every 6 (six) hours as needed for mild pain (pain score 1-3) (or Fever >/= 101). 20 tablet 0   apixaban  (ELIQUIS ) 5 MG TABS tablet Take 1 tablet (5 mg total) by mouth 2 (two) times daily. 200 tablet 1   budesonide -formoterol  (SYMBICORT ) 160-4.5 MCG/ACT inhaler Inhale 2 puffs into the lungs 2 (two) times daily. 1 each 5   Carboxymethylcellul-Glycerin (LUBRICATING EYE DROPS OP) Place 1 drop into both eyes 3 (three) times daily as needed (for dryness).     estradiol  (ESTRACE ) 0.1 MG/GM vaginal cream Place 0.5g nightly for two weeks then twice a week after 30 g 3   levothyroxine  (SYNTHROID ) 125 MCG tablet Take 125 mcg by mouth daily before breakfast.     losartan  (COZAAR ) 50 MG tablet Take 1 tablet (50 mg total) by mouth daily. 90 tablet 3   metoprolol  succinate (TOPROL -XL) 25 MG 24 hr tablet Take 1 tablet (25 mg total) by mouth daily. 30 tablet 11   mupirocin ointment (BACTROBAN) 2 % Apply 1 Application topically See admin instructions. Apply to affected area/ulcer on the right leg once a day     Spacer/Aero-Holding Chambers (AEROCHAMBER PLUS) Device Needs Aerochamber with Inhalers 1 each 0   triamcinolone  cream (KENALOG ) 0.1 % Apply 1 Application topically daily as needed (for rashes- affected areas).     Vibegron  (GEMTESA ) 75 MG TABS Take 1 tablet (75 mg total) by mouth daily.     Vibegron  (GEMTESA ) 75 MG TABS Take 1 tablet (75 mg total) by mouth daily. 30 tablet 2   Vitamin D, Ergocalciferol, (DRISDOL) 50000 units  CAPS capsule Take 50,000 Units by mouth every Thursday.     cefpodoxime (VANTIN) 200 MG tablet SMARTSIG:1 Tablet(s) By Mouth Every 12 Hours (Patient not taking: Reported on 05/01/2024)     MUCINEX  600 MG 12 hr tablet Take 600 mg by mouth 2 (two) times daily as needed for cough or to loosen phlegm. (Patient not taking: Reported on 05/01/2024)     ondansetron  (ZOFRAN ) 4 MG tablet Take 1 tablet (4 mg total) by mouth every 6 (six) hours as needed for nausea. (Patient not taking: Reported on 05/01/2024) 20 tablet 0   pantoprazole  (PROTONIX ) 40 MG tablet Take 1 tablet (40 mg total) by mouth daily. Take while on prednisone  (Patient not taking: Reported on 05/01/2024) 30 tablet 2   No facility-administered medications prior to visit.     Review of Systems:   Constitutional:   No  weight loss, night sweats,  Fevers, chills,+ fatigue, or  lassitude.  HEENT:   No headaches,  Difficulty swallowing,  Tooth/dental problems, or  Sore throat,                No sneezing, itching, ear ache, nasal congestion, post nasal drip,   CV:  No chest pain,  Orthopnea, PND, swelling in lower extremities, anasarca, dizziness, palpitations, syncope.   GI  No heartburn, indigestion, abdominal pain, nausea, vomiting, diarrhea, change in bowel habits, loss of appetite, bloody stools.   Resp:   No chest wall deformity  Skin: no rash or lesions.  GU: no dysuria, change in color of urine, no urgency or frequency.  No flank pain, no hematuria   MS:  No joint pain or swelling.  No decreased range of motion.  No back pain.    Physical Exam  BP 100/60 (BP Location: Left Arm, Patient Position: Sitting, Cuff Size: Normal)   Pulse 86   LMP  (LMP Unknown)   SpO2 94%   GEN: A/Ox3; pleasant , NAD, well nourished, elderly    HEENT:  [place]/AT,   NOSE-clear, THROAT-clear, no lesions, no postnasal drip or exudate noted.   NECK:  Supple w/ fair ROM; no JVD; normal carotid impulses w/o bruits; no thyromegaly or nodules palpated; no  lymphadenopathy.    RESP  Clear  P & A; w/o, wheezes/ rales/ or rhonchi. no accessory muscle use, no dullness to percussion  CARD:  RRR, no m/r/g,tr-1_ peripheral edema, pulses intact, no cyanosis or clubbing. Compression hose   GI:   Soft & nt; nml bowel sounds; no organomegaly or masses detected.   Musco: Warm bil, no deformities or joint swelling noted.   Neuro: alert, no focal deficits noted.    Skin: Warm, no lesions or rashes    Lab Results:  CBC    Component Value Date/Time   WBC 8.7 12/12/2023 0453   RBC 4.31 12/12/2023 0453   RBC 4.13 12/12/2023 0453   HGB 12.7 12/12/2023 0453   HGB 11.8 04/14/2022 1216   HGB 11.1 (L) 02/17/2015 1054   HCT 41.0 12/12/2023 0453   HCT 34.8 04/14/2022 1216   HCT 34.3 (L) 02/17/2015 1054   PLT 168 12/12/2023 0453   PLT 204 04/14/2022 1216   MCV 95.1 12/12/2023 0453   MCV 86 04/14/2022 1216   MCV 87.6 02/17/2015 1054   MCH 29.5 12/12/2023 0453   MCHC 31.0 12/12/2023 0453   RDW 15.1 12/12/2023 0453   RDW 14.2 04/14/2022 1216   RDW 13.5 02/17/2015 1054   LYMPHSABS 0.9 12/12/2023 0453   LYMPHSABS 1.1 03/27/2021 1443   LYMPHSABS 1.3 02/17/2015 1054   MONOABS 0.6 12/12/2023 0453   MONOABS 0.4 02/17/2015 1054   EOSABS 0.0 12/12/2023 0453   EOSABS 0.1 03/27/2021 1443   BASOSABS 0.0 12/12/2023 0453   BASOSABS 0.0 03/27/2021 1443   BASOSABS 0.0 02/17/2015 1054    BMET    Component Value Date/Time   NA 134 (L) 12/12/2023 0453   NA 138 09/30/2021 1209   NA 140 02/17/2015 1054   K 4.3 12/12/2023 0453   K 3.8 02/17/2015 1054   CL 99 12/12/2023 0453   CO2 29 12/12/2023 0453   CO2 25 02/17/2015 1054   GLUCOSE 112 (H) 12/12/2023 0453   GLUCOSE 87 02/17/2015 1054   BUN 24 (H) 12/12/2023 0453   BUN 22 09/30/2021 1209   BUN 15.8 02/17/2015 1054   CREATININE 0.67 12/12/2023 0453   CREATININE 0.7 02/17/2015 1054   CALCIUM 8.5 (L) 12/12/2023 0453   CALCIUM  8.9 02/17/2015 1054   GFRNONAA >60 12/12/2023 0453   GFRAA 65 09/01/2017  1054    BNP    Component Value Date/Time   BNP 574.4 (H) 08/11/2017 0602    ProBNP No results found for: "PROBNP"  Imaging: No results found.  Administration History     None          Latest Ref Rng & Units 02/01/2024    9:25 AM 05/17/2017   11:00 AM  PFT Results  FVC-Pre L 1.32  2.00   FVC-Predicted Pre % 52  67   FVC-Post L 1.32  1.93   FVC-Predicted Post % 52  65   Pre FEV1/FVC % % 75  73   Post FEV1/FCV % % 77  77   FEV1-Pre L 1.00  1.46   FEV1-Predicted Pre % 53  66   FEV1-Post L 1.02  1.48   DLCO uncorrected ml/min/mmHg  14.92   DLCO UNC% %  51   DLCO corrected ml/min/mmHg  15.99   DLCO COR %Predicted %  55   DLVA Predicted %  75   TLC L 4.20  4.31   TLC % Predicted % 77  77   RV % Predicted % 104  105     Lab Results  Component Value Date   NITRICOXIDE 30 02/11/2017        Assessment & Plan:   ILD (interstitial lung disease) (HCC) Interstitial lung disease-NSIP pattern.  Exacerbation December 2024 suspicious for amiodarone  toxicity.  Clinically improved with steroids.  Patient is completely off steroids.  Now completely off of oxygen.  Appears to be at baseline.  No significant symptoms.  Will check spirometry with DLCO on return visit in 4 months.  Can decide at that time on future imaging as indicated Was started on Symbicort  during acute illness.  Unclear if still needed.  Advised patient that she may try couple days off of Symbicort  if no significant change can consider discontinuing.  If breathing worsens off Symbicort  remain on Symbicort  we  Plan  Patient Instructions  May Discontinue Oxygen.  CONTINUE Symbicort  160-4.5 mcg TWO puffs in the morning and evening- Rinse after use.  Activity as tolerated.  Follow up with Dr. Washington Hacker in 4 months with Spirometry with DLCO    Patient  Chronic respiratory failure with hypoxia (HCC) O2 sats are adequate on room air.  No desaturations with ambulation.  May discontinue oxygen.  We discussed  setting up for an overnight oximetry test however patient has not used oxygen over 2 months and does not want to proceed with that.     Roena Clark, NP 05/01/2024

## 2024-05-04 ENCOUNTER — Telehealth: Payer: Self-pay | Admitting: Adult Health

## 2024-05-04 NOTE — Telephone Encounter (Signed)
 Cmn received from [facility] for oxygen equipment.

## 2024-05-06 ENCOUNTER — Other Ambulatory Visit: Payer: Self-pay

## 2024-05-06 ENCOUNTER — Emergency Department (HOSPITAL_COMMUNITY)

## 2024-05-06 ENCOUNTER — Emergency Department (HOSPITAL_BASED_OUTPATIENT_CLINIC_OR_DEPARTMENT_OTHER)

## 2024-05-06 ENCOUNTER — Inpatient Hospital Stay (HOSPITAL_COMMUNITY)
Admission: EM | Admit: 2024-05-06 | Discharge: 2024-05-10 | DRG: 286 | Disposition: A | Discharge status: Home / Self Care | Attending: Internal Medicine | Admitting: Internal Medicine

## 2024-05-06 ENCOUNTER — Encounter (HOSPITAL_COMMUNITY): Payer: Self-pay | Admitting: Emergency Medicine

## 2024-05-06 DIAGNOSIS — R918 Other nonspecific abnormal finding of lung field: Secondary | ICD-10-CM | POA: Diagnosis not present

## 2024-05-06 DIAGNOSIS — I5033 Acute on chronic diastolic (congestive) heart failure: Secondary | ICD-10-CM

## 2024-05-06 DIAGNOSIS — J704 Drug-induced interstitial lung disorders, unspecified: Secondary | ICD-10-CM | POA: Diagnosis present

## 2024-05-06 DIAGNOSIS — Z79890 Hormone replacement therapy (postmenopausal): Secondary | ICD-10-CM

## 2024-05-06 DIAGNOSIS — J9611 Chronic respiratory failure with hypoxia: Secondary | ICD-10-CM | POA: Diagnosis present

## 2024-05-06 DIAGNOSIS — I341 Nonrheumatic mitral (valve) prolapse: Secondary | ICD-10-CM | POA: Diagnosis present

## 2024-05-06 DIAGNOSIS — N390 Urinary tract infection, site not specified: Secondary | ICD-10-CM

## 2024-05-06 DIAGNOSIS — I719 Aortic aneurysm of unspecified site, without rupture: Secondary | ICD-10-CM | POA: Diagnosis not present

## 2024-05-06 DIAGNOSIS — Z950 Presence of cardiac pacemaker: Secondary | ICD-10-CM | POA: Diagnosis not present

## 2024-05-06 DIAGNOSIS — M940 Chondrocostal junction syndrome [Tietze]: Secondary | ICD-10-CM | POA: Diagnosis not present

## 2024-05-06 DIAGNOSIS — I4892 Unspecified atrial flutter: Secondary | ICD-10-CM | POA: Diagnosis not present

## 2024-05-06 DIAGNOSIS — Z91040 Latex allergy status: Secondary | ICD-10-CM | POA: Diagnosis not present

## 2024-05-06 DIAGNOSIS — I110 Hypertensive heart disease with heart failure: Principal | ICD-10-CM | POA: Diagnosis present

## 2024-05-06 DIAGNOSIS — I7121 Aneurysm of the ascending aorta, without rupture: Secondary | ICD-10-CM | POA: Diagnosis not present

## 2024-05-06 DIAGNOSIS — R079 Chest pain, unspecified: Secondary | ICD-10-CM | POA: Diagnosis not present

## 2024-05-06 DIAGNOSIS — J849 Interstitial pulmonary disease, unspecified: Secondary | ICD-10-CM | POA: Diagnosis not present

## 2024-05-06 DIAGNOSIS — Z7901 Long term (current) use of anticoagulants: Secondary | ICD-10-CM | POA: Diagnosis not present

## 2024-05-06 DIAGNOSIS — J90 Pleural effusion, not elsewhere classified: Secondary | ICD-10-CM | POA: Diagnosis not present

## 2024-05-06 DIAGNOSIS — Z79899 Other long term (current) drug therapy: Secondary | ICD-10-CM

## 2024-05-06 DIAGNOSIS — I4819 Other persistent atrial fibrillation: Secondary | ICD-10-CM | POA: Diagnosis not present

## 2024-05-06 DIAGNOSIS — J929 Pleural plaque without asbestos: Secondary | ICD-10-CM | POA: Diagnosis not present

## 2024-05-06 DIAGNOSIS — I340 Nonrheumatic mitral (valve) insufficiency: Secondary | ICD-10-CM | POA: Diagnosis present

## 2024-05-06 DIAGNOSIS — M7989 Other specified soft tissue disorders: Secondary | ICD-10-CM | POA: Diagnosis not present

## 2024-05-06 DIAGNOSIS — I10 Essential (primary) hypertension: Secondary | ICD-10-CM | POA: Diagnosis present

## 2024-05-06 DIAGNOSIS — E039 Hypothyroidism, unspecified: Secondary | ICD-10-CM | POA: Diagnosis present

## 2024-05-06 DIAGNOSIS — N3000 Acute cystitis without hematuria: Secondary | ICD-10-CM | POA: Diagnosis not present

## 2024-05-06 DIAGNOSIS — I5043 Acute on chronic combined systolic (congestive) and diastolic (congestive) heart failure: Secondary | ICD-10-CM | POA: Diagnosis not present

## 2024-05-06 DIAGNOSIS — I517 Cardiomegaly: Secondary | ICD-10-CM | POA: Diagnosis not present

## 2024-05-06 DIAGNOSIS — I493 Ventricular premature depolarization: Secondary | ICD-10-CM | POA: Diagnosis present

## 2024-05-06 DIAGNOSIS — I480 Paroxysmal atrial fibrillation: Secondary | ICD-10-CM | POA: Diagnosis not present

## 2024-05-06 DIAGNOSIS — I959 Hypotension, unspecified: Secondary | ICD-10-CM | POA: Diagnosis not present

## 2024-05-06 DIAGNOSIS — T462X5S Adverse effect of other antidysrhythmic drugs, sequela: Secondary | ICD-10-CM

## 2024-05-06 DIAGNOSIS — R072 Precordial pain: Secondary | ICD-10-CM | POA: Diagnosis not present

## 2024-05-06 DIAGNOSIS — Z7951 Long term (current) use of inhaled steroids: Secondary | ICD-10-CM

## 2024-05-06 DIAGNOSIS — R0789 Other chest pain: Secondary | ICD-10-CM | POA: Diagnosis not present

## 2024-05-06 DIAGNOSIS — I200 Unstable angina: Secondary | ICD-10-CM | POA: Diagnosis not present

## 2024-05-06 LAB — CBC
HCT: 35.2 % — ABNORMAL LOW (ref 36.0–46.0)
Hemoglobin: 11.2 g/dL — ABNORMAL LOW (ref 12.0–15.0)
MCH: 31 pg (ref 26.0–34.0)
MCHC: 31.8 g/dL (ref 30.0–36.0)
MCV: 97.5 fL (ref 80.0–100.0)
Platelets: 233 10*3/uL (ref 150–400)
RBC: 3.61 MIL/uL — ABNORMAL LOW (ref 3.87–5.11)
RDW: 14.6 % (ref 11.5–15.5)
WBC: 8.6 10*3/uL (ref 4.0–10.5)
nRBC: 0 % (ref 0.0–0.2)

## 2024-05-06 LAB — BASIC METABOLIC PANEL WITH GFR
Anion gap: 10 (ref 5–15)
BUN: 19 mg/dL (ref 8–23)
CO2: 24 mmol/L (ref 22–32)
Calcium: 9 mg/dL (ref 8.9–10.3)
Chloride: 106 mmol/L (ref 98–111)
Creatinine, Ser: 0.9 mg/dL (ref 0.44–1.00)
GFR, Estimated: >60 mL/min (ref >60)
Glucose, Bld: 89 mg/dL (ref 70–99)
Potassium: 3.9 mmol/L (ref 3.5–5.1)
Sodium: 140 mmol/L (ref 135–145)

## 2024-05-06 LAB — BRAIN NATRIURETIC PEPTIDE: B Natriuretic Peptide: 439.3 pg/mL — ABNORMAL HIGH (ref 0.0–100.0)

## 2024-05-06 LAB — URINALYSIS, ROUTINE W REFLEX MICROSCOPIC
Bilirubin Urine: NEGATIVE
Glucose, UA: NEGATIVE mg/dL
Ketones, ur: NEGATIVE mg/dL
Nitrite: NEGATIVE
Protein, ur: NEGATIVE mg/dL
Specific Gravity, Urine: 1.012 (ref 1.005–1.030)
WBC, UA: >50 WBC/hpf (ref 0–5)
pH: 6 (ref 5.0–8.0)

## 2024-05-06 LAB — TROPONIN I (HIGH SENSITIVITY)
Troponin I (High Sensitivity): 14 ng/L (ref <18)
Troponin I (High Sensitivity): 16 ng/L (ref <18)

## 2024-05-06 LAB — MAGNESIUM: Magnesium: 2 mg/dL (ref 1.7–2.4)

## 2024-05-06 MED ORDER — PENTOXIFYLLINE ER 400 MG PO TBCR
400.0000 mg | EXTENDED_RELEASE_TABLET | Freq: Every day | ORAL | Status: DC
  Administered 2024-05-07 – 2024-05-10 (×4): 400 mg via ORAL
  Filled 2024-05-06 (×4): qty 1

## 2024-05-06 MED ORDER — IOHEXOL 350 MG/ML SOLN
75.0000 mL | Freq: Once | INTRAVENOUS | Status: AC | PRN
  Administered 2024-05-06: 75 mL via INTRAVENOUS

## 2024-05-06 MED ORDER — LEVOTHYROXINE SODIUM 25 MCG PO TABS
125.0000 ug | ORAL_TABLET | Freq: Every day | ORAL | Status: DC
  Administered 2024-05-07 – 2024-05-10 (×4): 125 ug via ORAL
  Filled 2024-05-06 (×4): qty 1

## 2024-05-06 MED ORDER — FUROSEMIDE 10 MG/ML IJ SOLN
40.0000 mg | Freq: Two times a day (BID) | INTRAMUSCULAR | Status: DC
  Administered 2024-05-07: 40 mg via INTRAVENOUS
  Filled 2024-05-06: qty 4

## 2024-05-06 MED ORDER — FUROSEMIDE 10 MG/ML IJ SOLN
40.0000 mg | Freq: Once | INTRAMUSCULAR | Status: AC
  Administered 2024-05-06: 40 mg via INTRAVENOUS
  Filled 2024-05-06: qty 4

## 2024-05-06 MED ORDER — HYDROCODONE-ACETAMINOPHEN 5-325 MG PO TABS
1.0000 | ORAL_TABLET | Freq: Once | ORAL | Status: AC
  Administered 2024-05-06: 1 via ORAL
  Filled 2024-05-06: qty 1

## 2024-05-06 MED ORDER — APIXABAN 5 MG PO TABS
5.0000 mg | ORAL_TABLET | Freq: Two times a day (BID) | ORAL | Status: DC
  Administered 2024-05-06 – 2024-05-07 (×3): 5 mg via ORAL
  Filled 2024-05-06 (×3): qty 1

## 2024-05-06 MED ORDER — ONDANSETRON HCL 4 MG/2ML IJ SOLN: 4.0000 mg | Freq: Four times a day (QID) | INTRAMUSCULAR | Status: DC | PRN

## 2024-05-06 MED ORDER — ASPIRIN 81 MG PO CHEW
324.0000 mg | CHEWABLE_TABLET | Freq: Once | ORAL | Status: DC
  Filled 2024-05-06: qty 4

## 2024-05-06 MED ORDER — FLUTICASONE FUROATE-VILANTEROL 200-25 MCG/ACT IN AEPB
1.0000 | INHALATION_SPRAY | Freq: Every day | RESPIRATORY_TRACT | Status: DC
  Administered 2024-05-07 – 2024-05-10 (×3): 1 via RESPIRATORY_TRACT
  Filled 2024-05-06: qty 28

## 2024-05-06 MED ORDER — ACETAMINOPHEN 325 MG PO TABS: 650.0000 mg | ORAL_TABLET | ORAL | Status: DC | PRN

## 2024-05-06 MED ORDER — MIRABEGRON ER 25 MG PO TB24
25.0000 mg | ORAL_TABLET | Freq: Every day | ORAL | Status: DC
  Administered 2024-05-07: 25 mg via ORAL
  Filled 2024-05-06 (×4): qty 1

## 2024-05-06 MED ORDER — METOPROLOL SUCCINATE ER 25 MG PO TB24
25.0000 mg | ORAL_TABLET | Freq: Every day | ORAL | Status: DC
Start: 2024-05-07 — End: 2024-05-10
  Administered 2024-05-07: 25 mg via ORAL
  Filled 2024-05-06 (×3): qty 1

## 2024-05-06 MED ORDER — LOSARTAN POTASSIUM 50 MG PO TABS
50.0000 mg | ORAL_TABLET | Freq: Every day | ORAL | Status: DC
  Administered 2024-05-07: 50 mg via ORAL
  Filled 2024-05-06 (×3): qty 1

## 2024-05-06 NOTE — ED Triage Notes (Signed)
 Pt [facility] from [facility] for non-radiating Chest pressure.  Began yesterday in a minor way, much more noticeable this AM.  Pt has cardiac hx to include pacemaker and aortic aneurysm. Pt denies SOB NV diaphoresis.      170/90 after 1 nitroglycerin.  HR 80 (paced), 95% RA.   324 ASA also given

## 2024-05-06 NOTE — ED Provider Notes (Signed)
  Physical Exam  BP (!) 159/88   Pulse 71   Temp 98 F (36.7 C) (Oral)   Resp (!) 24   Ht 5' 7.5" (1.715 m)   Wt 74.8 kg   LMP  (LMP Unknown)   SpO2 100%   BMI 25.46 kg/m   Physical Exam Vitals and nursing note reviewed.  Constitutional:      General: She is not in acute distress.    Appearance: She is well-developed.  HENT:     Head: Normocephalic and atraumatic.  Eyes:     Conjunctiva/sclera: Conjunctivae normal.  Cardiovascular:     Rate and Rhythm: Normal rate and regular rhythm.     Heart sounds: No murmur heard. Pulmonary:     Effort: Pulmonary effort is normal. No respiratory distress.     Breath sounds: Rales present.  Abdominal:     Palpations: Abdomen is soft.     Tenderness: There is no abdominal tenderness.  Musculoskeletal:        General: No swelling.     Cervical back: Neck supple.     Right lower leg: Edema present.     Left lower leg: Edema present.  Skin:    General: Skin is warm and dry.     Capillary Refill: Capillary refill takes less than 2 seconds.  Neurological:     Mental Status: She is alert.  Psychiatric:        Mood and Affect: Mood normal.     Procedures  Procedures  ED Course / MDM   Clinical Course as of 05/06/24 1613  Sun May 06, 2024  1326 Troponin normal.  BNP normal.  CBC unremarkable [JK]  1549 CXR shows left lung opacity and effusion [JK]    Clinical Course User Index [JK] Trish Furl, MD   Medical Decision Making Amount and/or Complexity of Data Reviewed Labs: ordered. Radiology: ordered.  Risk OTC drugs. Prescription drug management. Decision regarding hospitalization.   Patient received in handoff.  Chest pain pending CTA.  Initial troponin is normal.  CTA with pleural effusions, stable aneurysm.  On reevaluation, patient has persistent pressure-like chest pain and is moderate risk heart score.  Would benefit from repeat echo as her last was 2 years ago and she has not on diuretic therapy normally.  Spoke  with Dr. Alda Amas of cardiology who will evaluate the patient.  Patient admitted for moderate chest pain and fluid overload.  Lasix  begun       Karlyn Overman, MD 05/06/24 1615

## 2024-05-06 NOTE — Consult Note (Signed)
 Cardiology Consultation   Patient ID: JANYCE ELLINGER MRN: [id number]; DOB: July 08, 1933  Admit date: 05/06/2024 Date of Consult: 05/06/2024  PCP:  Tena Feeling, MD   [facility] Providers Cardiologist:  None  Electrophysiologist:  Manya Sells, MD  {   Patient Profile:   PEREL HAUSCHILD is a 89 y.o. female with a hx of chronic atrial fibrillation/flutter s/p ablation, s/p AV nodal ablation and Medtronic DDD PPM 08/2017, chronic diastolic heart failure, PVCs, hypertension, hypothyroidism, thoracic aortic aneurysm, who is being seen 05/06/2024 for the evaluation of chest pain at the request of Dr. Jeannie Milo.  History of Present Illness:   Ms. Dotter with above past medical history presented to the ER today complaining of chest pain.  She started having minor chest pressure yesterday.  This morning chest discomfort was much more prominent.  She has also noted some increased swelling and redness of her left lower extremity.  Per ER workup, CBC with hemoglobin 11.2, otherwise unremarkable BMP.  High sensitive troponin negative 14 x1. CTA chest showed small bilateral pleural effusion right greater than left with minimal patchy posterior right base opacification likely atelectasis, although cannot exclude early infection, stable cardiomegaly with evidence of biatrial enlargement, stable ascending thoracic aorta 4.2 cm in greatest diameter.  No dissection.  EKG V paced.  Per chart review, she follows Dr. Carolynne Citron outpatient had a history of chronic/uncontrolled atrial fibrillation and underwent AV nodal ablation and Medtronic DDD PPM 08/17/2017.  She has had atrial flutter required ablation 02/04/2017.  She was intolerant to flecainide and Multaq  for PVC suppression, has been maintained amiodarone  and Toprol -XL.  She is historically anticoagulated with Eliquis  5 mg twice daily.  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  PFT from 02/01/2024 FVC 1.32 (52%) FEV1 1.02 (54%) Ratio 77 TLC 77% DLCO Unable to obtain.  Moderately severe restrictive defect.  She had improvement on oxygen and steroids and Symbicort , weaned off oxygen support.  Last follow-up with pulmonology 05/01/2024, was recommended Symbicort  during acute illness and repeat PFT in 85-month.  Amiodarone  had been stopped.   She last followed up with Dr. Carolynne Citron on 04/03/2024, feeling fairly well.  She was rate controlled on atrial fibrillation and pacemaker was functioning normally.   Most recent echocardiogram was from 05/05/2022 revealing LVEF 60 to 65%, no regional motion abnormality, mild concentric LVH, RV normal, mild elevated PASP 39.5 mmHg, severe LAE, severe RAE, mitral valve is myxomatous. Moderate MR,There is mild late systolic prolapse of both leaflets of the mitral valve.  Moderate TR, aortic sclerosis.  Borderline dilatation of ascending aorta 38 mm.   She has been maintained on GDMT for diastolic heart failure with Toprol  XL 25 mg daily, losartan  50 mg daily.  She is on Lasix  40 mg daily at baseline.   Past Medical History:  Diagnosis Date   Allergic rhinitis    Arthritis    bil knees   Breast cancer, left breast (HCC) 01/15/2015   Invasive Mammary   Chronic venous insufficiency    Hematuria    negative workup - Dr. Theotis Flake   Hypertension    Hypothyroidism    Migraine headache    MVP (mitral valve prolapse)    OAB (overactive bladder)    Pacemaker    PAF (paroxysmal atrial fibrillation) (HCC)    SVT (supraventricular tachycardia) (HCC) 06/2010   s/p AVNRT ablation   Wears glasses     Past Surgical History:  Procedure Laterality Date   A-FLUTTER ABLATION N/A 02/04/2017   Procedure: A-Flutter Ablation;  Surgeon: Tammie Fall, MD;  Location: [facility] INVASIVE CV LAB;  Service: Cardiovascular;  Laterality: N/A;   ABDOMINAL HYSTERECTOMY     AV NODE ABLATION N/A 08/17/2017   Procedure: AV Node Ablation;  Surgeon:  Tammie Fall, MD;  Location: [facility] INVASIVE CV LAB;  Service: Cardiovascular;  Laterality: N/A;   BREAST LUMPECTOMY WITH RADIOACTIVE SEED LOCALIZATION Left 02/14/2015   Procedure: BREAST LUMPECTOMY WITH RADIOACTIVE SEED LOCALIZATION;  Surgeon: Juanita Norlander, MD;  Location: [facility];  Service: General;  Laterality: Left;   BREAST LUMPECTOMY WITH RADIOACTIVE SEED LOCALIZATION Left 11/18/2021   Procedure: LEFT BREAST LUMPECTOMY WITH RADIOACTIVE SEED LOCALIZATION;  Surgeon: Sim Dryer, MD;  Location: [facility];  Service: General;  Laterality: Left;   BREAST SURGERY  1990   rt br bx   CARDIAC CATHETERIZATION  2011   ablasion   CARDIOVERSION N/A 08/10/2017   Procedure: CARDIOVERSION;  Surgeon: Hugh Madura, MD;  Location: [facility] ENDOSCOPY;  Service: Cardiovascular;  Laterality: N/A;   COLONOSCOPY     DILATION AND CURETTAGE OF UTERUS     Left Breast Biopsy  01/15/15   PACEMAKER IMPLANT N/A 08/17/2017   Procedure: Pacemaker Implant;  Surgeon: Tammie Fall, MD;  Location: [facility] INVASIVE CV LAB;  Service: Cardiovascular;  Laterality: N/A;   TUBAL LIGATION       Home Medications:  Prior to Admission medications   Medication Sig Start Date End Date Taking? Authorizing Provider  apixaban  (ELIQUIS ) 5 MG TABS tablet Take 1 tablet (5 mg total) by mouth 2 (two) times daily. 10/04/23  Yes Tammie Fall, MD  budesonide -formoterol  (SYMBICORT ) 160-4.5 MCG/ACT inhaler Inhale 2 puffs into the lungs 2 (two) times daily. 02/01/24  Yes Quillian Brunt, MD  clobetasol cream (TEMOVATE) 0.05 % Apply 1 Application topically 2 (two) times daily. 04/26/24  Yes [provider]  furosemide  (LASIX ) 40 MG tablet Take 40 mg by mouth daily.   Yes [provider]  levothyroxine  (SYNTHROID ) 125 MCG tablet Take 125 mcg by mouth daily before breakfast. 05/30/23  Yes [provider]  losartan  (COZAAR ) 50 MG tablet Take 1 tablet (50 mg total) by mouth daily. 03/29/23  Yes Tammie Fall, MD  metoprolol  succinate (TOPROL -XL) 25 MG 24 hr tablet Take 1 tablet (25 mg total) by mouth daily. 05/18/23  Yes Tammie Fall, MD  pentoxifylline (TRENTAL) 400 MG CR tablet Take 400 mg by mouth daily. 04/26/24  Yes [provider]  Spacer/Aero-Holding Chambers (AEROCHAMBER PLUS) Device Needs Aerochamber with Inhalers 12/12/23  Yes Aura Leeds Latif, DO  Vibegron  (GEMTESA ) 75 MG TABS Take 1 tablet (75 mg total) by mouth daily. 03/27/24  Yes Wyonia Hefty T, MD  acetaminophen  (TYLENOL ) 325 MG tablet Take 2 tablets (650 mg total) by mouth every 6 (six) hours as needed for mild pain (pain score 1-3) (or Fever >/= 101). 12/12/23   Sheikh, Omair Latif, DO  Carboxymethylcellul-Glycerin (LUBRICATING EYE DROPS OP) Place 1 drop into both eyes 3 (three) times daily as needed (for dryness).    [provider]  estradiol  (ESTRACE ) 0.1 MG/GM vaginal cream Place 0.5g nightly for two weeks then twice a week after 03/29/24   Darlene Ehlers, MD  mupirocin ointment (BACTROBAN) 2 % Apply 1 Application topically See admin instructions. Apply to affected area/ulcer on the right leg once a day    [provider]  triamcinolone  cream (  KENALOG ) 0.1 % Apply 1 Application topically daily as needed (for rashes- affected areas). 12/15/21   [provider]  Vibegron  (GEMTESA ) 75 MG TABS Take 1 tablet (75 mg total) by mouth daily. Patient not taking: Reported on 05/06/2024 03/27/24   Wyonia Hefty T, MD  Vitamin D, Ergocalciferol, (DRISDOL) 50000 units CAPS capsule Take 50,000 Units by mouth every Thursday.    [provider]    Inpatient Medications: Scheduled Meds:  aspirin  324 mg Oral Once   furosemide   40 mg Intravenous Once   HYDROcodone -acetaminophen   1 tablet Oral Once   Continuous Infusions:  PRN Meds:   Allergies:    Allergies  Allergen Reactions   Adhesive [Tape] Rash and Other (See Comments)    Including EKG electrodes and the like   Codeine Other (See Comments)     Reaction not recalled   Latex Itching   Oxycodone-Aspirin Nausea And Vomiting   Percodan [Oxycodone-Aspirin] Nausea And Vomiting   Quinolones     Patient with ATAA (ascending thoracic aortic aneurysm)    Social History:   Social History   Socioeconomic History   Marital status: Married    Spouse name: Not on file   Number of children: 3   Years of education: Not on file   Highest education level: Not on file  Occupational History   Not on file  Tobacco Use   Smoking status: Never   Smokeless tobacco: Never   Tobacco comments:    did smoke about 2cigs long time about 54 plus years ago  Vaping Use   Vaping status: Never Used  Substance and Sexual Activity   Alcohol  use: Yes    Comment: glass of wine nightly   Drug use: No   Sexual activity: Not Currently    Birth control/protection: Surgical  Other Topics Concern   Not on file  Social History Narrative   LIves with husband.    Retired.    Social Drivers of Corporate investment banker Strain: Not on file  Food Insecurity: No Food Insecurity (12/12/2023)   Hunger Vital Sign    Worried About Running Out of Food in the Last Year: Never true    Ran Out of Food in the Last Year: Never true  Transportation Needs: No Transportation Needs (12/10/2023)   PRAPARE - Administrator, Civil Service (Medical): No    Lack of Transportation (Non-Medical): No  Physical Activity: Not on file  Stress: Not on file  Social Connections: Patient Declined (12/12/2023)   Social Connection and Isolation Panel [NHANES]    Frequency of Communication with Friends and Family: Patient declined    Frequency of Social Gatherings with Friends and Family: Patient declined    Attends Religious Services: Patient declined    Database administrator or Organizations: Patient declined    Attends Banker Meetings: Patient declined    Marital Status: Patient declined  Intimate Partner Violence: Not At Risk (12/10/2023)    Humiliation, Afraid, Rape, and Kick questionnaire    Fear of Current or Ex-Partner: No    Emotionally Abused: No    Physically Abused: No    Sexually Abused: No    Family History:    Family History  Problem Relation Age of Onset   Hypertension Mother    Heart disease Mother    Arthritis Mother    Other Father        hardening of the arteries   Heart Problems Sister  pacemaker   Heart attack Brother    Throat cancer Paternal Uncle    Breast cancer Sister        early 45s   Alzheimer's disease Sister    Pulmonary disease Sister    Breast cancer Daughter 7       Negative genetic testing    Breast cancer Paternal Aunt        dx over 57   Cancer Paternal Aunt        cancer in her leg   Breast cancer Cousin        multiple paternal cousin     ROS:  Please see the history of present illness.   All other ROS reviewed and negative.     Physical Exam/Data:   Vitals:   05/06/24 1246 05/06/24 1248 05/06/24 1500 05/06/24 1540  BP: (!) 169/84  (!) 159/88   Pulse: 76 71 71   Resp: (!) 28 19 (!) 24   Temp:    98 F (36.7 C)  TempSrc:    Oral  SpO2: 96% 96% 100%   Weight:      Height:       No intake or output data in the 24 hours ending 05/06/24 1617    05/06/2024   11:53 AM 04/23/2024    3:12 PM 04/03/2024    2:30 PM  Last 3 Weights  Weight (lbs) 165 lb 161 lb 161 lb 12.8 oz  Weight (kg) 74.844 kg 73.029 kg 73.392 kg     Body mass index is 25.46 kg/m.  General:  Well nourished, well developed, in no acute distress HEENT: normal Neck: no JVD Vascular: No carotid bruits; Distal pulses 2+ bilaterally Cardiac:  normal S1, S2; RRR; no murmur  Lungs:  scattered basilar rales  Abd: soft, nontender, no hepatomegaly  Ext: no edema Musculoskeletal:  No deformities, BUE and BLE strength normal and equal Skin: warm and dry  Neuro:  CNs 2-12 intact, no focal abnormalities noted Psych:  Normal affect   EKG:  The EKG was personally reviewed and demonstrates:  afib  with ventricular pacing  EKG today revealed paced rhythm  Telemetry:  Telemetry was personally reviewed and demonstrates:  afib with ventricular pacing  Relevant CV Studies:  Most recent PPM interrogation 02/20/2024:  Revealed normal device function, AS-VS 0.38%, AS-VP 99.62%, better rate remaining longevity 36-month.  Echocardiogram from 05/05/2022:  1. Left ventricular ejection fraction, by estimation, is 60 to 65%. The  left ventricle has normal function. The left ventricle has no regional  wall motion abnormalities. There is mild concentric left ventricular  hypertrophy. Left ventricular diastolic  function could not be evaluated.   2. Right ventricular systolic function is normal. The right ventricular  size is normal. There is mildly elevated pulmonary artery systolic  pressure. The estimated right ventricular systolic pressure is 39.5 mmHg.   3. Left atrial size was severely dilated.   4. Right atrial size was severely dilated.   5. The mitral valve is myxomatous. Moderate mitral valve regurgitation.  There is mild late systolic prolapse of both leaflets of the mitral valve.   6. Tricuspid valve regurgitation is moderate.   7. The aortic valve is tricuspid. There is mild calcification of the  aortic valve. There is mild thickening of the aortic valve. Aortic valve  regurgitation is not visualized. Aortic valve sclerosis/calcification is  present, without any evidence of  aortic stenosis.   8. There is borderline dilatation of the ascending aorta, measuring 38  mm.  Laboratory Data:  High Sensitivity Troponin:   Recent Labs  Lab 05/06/24 1155  TROPONINIHS 14     Chemistry Recent Labs  Lab 05/06/24 1155  NA 140  K 3.9  CL 106  CO2 24  GLUCOSE 89  BUN 19  CREATININE 0.90  CALCIUM 9.0  GFRNONAA >60  ANIONGAP 10    No results for input(s): "PROT", "ALBUMIN", "AST", "ALT", "ALKPHOS", "BILITOT" in the last 168 hours. Lipids No results for input(s):  "CHOL", "TRIG", "HDL", "LABVLDL", "LDLCALC", "CHOLHDL" in the last 168 hours.  Hematology Recent Labs  Lab 05/06/24 1155  WBC 8.6  RBC 3.61*  HGB 11.2*  HCT 35.2*  MCV 97.5  MCH 31.0  MCHC 31.8  RDW 14.6  PLT 233   Thyroid  No results for input(s): "TSH", "FREET4" in the last 168 hours.  BNPNo results for input(s): "BNP", "PROBNP" in the last 168 hours.  DDimer No results for input(s): "DDIMER" in the last 168 hours.   Radiology/Studies:  CT Angio Chest Aorta W and/or Wo Contrast Result Date: 05/06/2024 CLINICAL DATA:  Suspect acute aortic syndrome. EXAM: CT ANGIOGRAPHY CHEST WITH CONTRAST TECHNIQUE: Multidetector CT imaging of the chest was performed using the standard protocol during bolus administration of intravenous contrast. Multiplanar CT image reconstructions and MIPs were obtained to evaluate the vascular anatomy. RADIATION DOSE REDUCTION: This exam was performed according to the departmental dose-optimization program which includes automated exposure control, adjustment of the mA and/or kV according to patient size and/or use of iterative reconstruction technique. CONTRAST:  75mL OMNIPAQUE IOHEXOL 350 MG/ML SOLN COMPARISON:  12/09/2023 FINDINGS: Cardiovascular: Dual lead left-sided cardiac pacemaker. Persistent cardiomegaly with evidence of biatrial enlargement. Ascending thoracic aorta measures approximately 4.2 cm in greatest diameter without significant change. No evidence of dissection. Mild calcified plaque over the descending thoracic aorta. Normal 3 vessel takeoff of the aortic arch. Poor opacification over the pulmonary arterial system. Remaining vascular structures are unremarkable. Mediastinum/Nodes: No mediastinal or hilar adenopathy. Remaining mediastinal structures are unremarkable. Lungs/Pleura: Lungs are adequately inflated demonstrate small bilateral pleural effusions right greater than left with minimal patchy posterior right base opacification likely atelectasis  although could not exclude early infection. Mild linear atelectasis over the lingula. Mild scarring over the posterior apices. Airways are unremarkable. Upper Abdomen: Minimal calcified plaque over the abdominal aorta. No acute findings in the upper abdomen. Musculoskeletal: No acute findings. Review of the MIP images confirms the above findings. IMPRESSION: 1. Small bilateral pleural effusions right greater than left with minimal patchy posterior right base opacification likely atelectasis, although could not exclude early infection. 2. Stable cardiomegaly with evidence of biatrial enlargement. 3. Stable ascending thoracic aorta measuring approximately 4.2 cm in greatest diameter. No evidence of aortic dissection. Recommend annual imaging followup by CTA or MRA. This recommendation follows 2010 ACCF/AHA/AATS/ACR/ASA/SCA/SCAI/SIR/STS/SVM Guidelines for the Diagnosis and Management of Patients with Thoracic Aortic Disease. Circulation. 2010; 121: N829-F621. Aortic aneurysm NOS (ICD10-I71.9). 4. Aortic atherosclerosis. Aortic Atherosclerosis (ICD10-I70.0). Electronically Signed   By: Roda Cirri M.D.   On: 05/06/2024 15:49   DG Chest 2 View Result Date: 05/06/2024 CLINICAL DATA:  Chest pain EXAM: CHEST - 2 VIEW COMPARISON:  X-ray 03/14/2024 and older FINDINGS: Underinflation. Enlarged cardiopericardial silhouette with calcified tortuous aorta. Increasing left retrocardiac opacity and effusion. Apical pleural thickening. Left upper chest pacemaker. No edema. Interstitial changes are again noted. Overlapping cardiac leads. Films are under penetrated. Likely tiny right effusion as well. Surgical changes along the left humeral head. IMPRESSION: Increasing left lung base opacity and effusion.  Tiny right effusion. Enlarged cardiopericardial silhouette vascular congestion. Pacemaker. Recommend follow-up Electronically Signed   By: Adrianna Horde M.D.   On: 05/06/2024 12:36     Assessment and Plan:   Chest pain -  Presenting with chest pain since yesterday; she has no known CAD or previous ischemic evaluation - Hs troponin negative x 1, trend to peak - EKG non-diagnostic - Plan to repeat echocardiogram for further evaluation  Acute on chronic diastolic heart failure Moderate mitral regurgitation -BNP pending  -CT chest showed small bilateral pleural effusion R>L with minimal patchy posterior right base opacification, stable cardiomegaly with evidence of biatrial enlargement, stable thoracic aortic aneurysm 4.2 cm -Agree with lower extremity Doppler to rule out DVT, consider differential of cellulitis -Pending echocardiogram, will follow -On PTA Lasix  40 mg daily, s/p IV Lasix  40 mg x 1 in the ED, will monitor urine output and response -GDMT: Continue PTA metoprolol  XL 25 mg daily and losartan  50 mg daily  Chronic atrial fibrillation History of atrial flutter status post ablation Status post Medtronic DDD PPM and AV node ablation 2019 - Will interrogate pacemaker this admission - Continue PTA metoprolol  25 mg daily and Eliquis  5 mg twice daily  Hypertension Hypothyroidism Interstitial lung disease -Per hospitalist    Risk Assessment/Risk Scores:   New York  Heart Association (NYHA) Functional Class NYHA Class II  CHA2DS2-VASc Score = 5   This indicates a 7.2% annual risk of stroke. The patient's score is based upon: CHF History: 1 HTN History: 1 Diabetes History: 0 Stroke History: 0 Vascular Disease History: 0 Age Score: 2 Gender Score: 1   For questions or updates, please contact [facility] Please consult [email or web address] for contact info under    Signed, Xika Zhao, NP  05/06/2024 4:17 PM  Cardiology Attending  Patient seen and examined. She is well known to me. She c/o chest pressure/heaviness but denies sob. Her weight is up. Her initial troponin is negative. She developed some evidence of pulmonary toxicity on amiodarone  which we were using for symptomatic PVCs.  She has longstanding atrial fib. On exam she is a pleasant elderly woman, NAD. Lungs with scattered rales and CV with a RRR and ext with 2+ edema, left more than right. Tele with afib and ventricular pacing. ECG - afib with ventricular pacing.  A/P Chest pressure - would recheck another enzyme but I doubt an acute coronary syndrome.  Pleural effusions - her weight is up a bit. I agree with IV lasix . Follow renal function.  Afib - her rates are well controlled.  PVC's - these appear fairly quiet.  PPM - we will recheck though 2 months ago it was working normally.  Pete Brand Shyan Scalisi,MD

## 2024-05-06 NOTE — ED Provider Notes (Signed)
 [place] EMERGENCY DEPARTMENT AT [facility] Provider Note   CSN: [id number] Arrival date & time: 05/06/24  1147     History  Chief complaint: Chest pain  [name] is a 89 y.o. female.  HPI   Patient presented to the ED for evaluation of chest pain.  Patient states she started having symptoms yesterday.  They persisted through today.  Patient feels a pressure discomfort in her chest.  Patient does have a history of hypertension SVT paroxysmal atrial fibrillation she has a pacemaker however she does not have history of coronary artery disease.  Patient also has noticed some increased swelling and redness of her left lower extremity recently.  She has had more chronic issues but it does feel worse than usual.  She is post to see a vascular doctor  Home Medications Prior to Admission medications   Medication Sig Start Date End Date Taking? Authorizing Provider  acetaminophen  (TYLENOL ) 325 MG tablet Take 2 tablets (650 mg total) by mouth every 6 (six) hours as needed for mild pain (pain score 1-3) (or Fever >/= 101). 12/12/23   Aura Leeds Latif, DO  apixaban  (ELIQUIS ) 5 MG TABS tablet Take 1 tablet (5 mg total) by mouth 2 (two) times daily. 10/04/23   Tammie Fall, MD  budesonide -formoterol  (SYMBICORT ) 160-4.5 MCG/ACT inhaler Inhale 2 puffs into the lungs 2 (two) times daily. 02/01/24   Quillian Brunt, MD  Carboxymethylcellul-Glycerin (LUBRICATING EYE DROPS OP) Place 1 drop into both eyes 3 (three) times daily as needed (for dryness).    [provider]  estradiol  (ESTRACE ) 0.1 MG/GM vaginal cream Place 0.5g nightly for two weeks then twice a week after 03/29/24   Darlene Ehlers, MD  levothyroxine  (SYNTHROID ) 125 MCG tablet Take 125 mcg by mouth daily before breakfast. 05/30/23   [provider]  losartan  (COZAAR ) 50 MG tablet Take 1 tablet (50 mg total) by mouth daily. 03/29/23   Tammie Fall, MD  metoprolol  succinate (TOPROL -XL) 25 MG 24 hr tablet  Take 1 tablet (25 mg total) by mouth daily. 05/18/23   Tammie Fall, MD  mupirocin ointment (BACTROBAN) 2 % Apply 1 Application topically See admin instructions. Apply to affected area/ulcer on the right leg once a day    [provider]  Spacer/Aero-Holding Chambers (AEROCHAMBER PLUS) Device Needs Aerochamber with Inhalers 12/12/23   Aura Leeds Latif, DO  triamcinolone  cream (KENALOG ) 0.1 % Apply 1 Application topically daily as needed (for rashes- affected areas). 12/15/21   [provider]  Vibegron  (GEMTESA ) 75 MG TABS Take 1 tablet (75 mg total) by mouth daily. 03/27/24   Darlene Ehlers, MD  Vibegron  (GEMTESA ) 75 MG TABS Take 1 tablet (75 mg total) by mouth daily. 03/27/24   Darlene Ehlers, MD  Vitamin D, Ergocalciferol, (DRISDOL) 50000 units CAPS capsule Take 50,000 Units by mouth every Thursday.    [provider]      Allergies    Adhesive [tape], Codeine, Latex, Oxycodone-aspirin, Percodan [oxycodone-aspirin], and Quinolones    Review of Systems   Review of Systems  Physical Exam Updated Vital Signs BP (!) 169/84   Pulse 71   Temp 97.9 F (36.6 C)   Resp 19   Ht 1.715 m (5' 7.5")   Wt 74.8 kg   LMP  (LMP Unknown)   SpO2 96%   BMI 25.46 kg/m  Physical Exam Vitals and nursing note reviewed.  Constitutional:      General: She is not in  acute distress.    Appearance: She is well-developed.  HENT:     Head: Normocephalic and atraumatic.     Right Ear: External ear normal.     Left Ear: External ear normal.  Eyes:     General: No scleral icterus.       Right eye: No discharge.        Left eye: No discharge.     Conjunctiva/sclera: Conjunctivae normal.  Neck:     Trachea: No tracheal deviation.  Cardiovascular:     Rate and Rhythm: Normal rate and regular rhythm.  Pulmonary:     Effort: Pulmonary effort is normal. No respiratory distress.     Breath sounds: Normal breath sounds. No stridor. No wheezing or rales.  Abdominal:     General: Bowel  sounds are normal. There is no distension.     Palpations: Abdomen is soft.     Tenderness: There is no abdominal tenderness. There is no guarding or rebound.  Musculoskeletal:        General: No tenderness or deformity.     Cervical back: Neck supple.     Left lower leg: Edema present.  Skin:    General: Skin is warm and dry.     Findings: No rash.  Neurological:     General: No focal deficit present.     Mental Status: She is alert.     Cranial Nerves: No cranial nerve deficit, dysarthria or facial asymmetry.     Sensory: No sensory deficit.     Motor: No abnormal muscle tone or seizure activity.     Coordination: Coordination normal.  Psychiatric:        Mood and Affect: Mood normal.     ED Results / Procedures / Treatments   Labs (all labs ordered are listed, but only abnormal results are displayed) Labs Reviewed  CBC - Abnormal; Notable for the following components:      Result Value   RBC 3.61 (*)    Hemoglobin 11.2 (*)    HCT 35.2 (*)    All other components within normal limits  BASIC METABOLIC PANEL WITH GFR  TROPONIN I (HIGH SENSITIVITY)    EKG EKG Interpretation Date/Time:  Sunday May 06 2024 11:55:00 EDT Ventricular Rate:  74 PR Interval:    QRS Duration:  105 QT Interval:  412 QTC Calculation: 458 R Axis:   -60  Text Interpretation: paced rhythm No significant change since last tracing Confirmed by Trish Furl [phone]) on 05/06/2024 11:58:25 AM  Radiology DG Chest 2 View Result Date: 05/06/2024 CLINICAL DATA:  Chest pain EXAM: CHEST - 2 VIEW COMPARISON:  X-ray 03/14/2024 and older FINDINGS: Underinflation. Enlarged cardiopericardial silhouette with calcified tortuous aorta. Increasing left retrocardiac opacity and effusion. Apical pleural thickening. Left upper chest pacemaker. No edema. Interstitial changes are again noted. Overlapping cardiac leads. Films are under penetrated. Likely tiny right effusion as well. Surgical changes along the left humeral  head. IMPRESSION: Increasing left lung base opacity and effusion. Tiny right effusion. Enlarged cardiopericardial silhouette vascular congestion. Pacemaker. Recommend follow-up Electronically Signed   By: Adrianna Horde M.D.   On: 05/06/2024 12:36    Procedures Procedures    Medications Ordered in ED Medications  aspirin chewable tablet 324 mg (324 mg Oral Not Given 05/06/24 1256)    ED Course/ Medical Decision Making/ A&P Clinical Course as of 05/06/24 1623  Sun May 06, 2024  1326 Troponin normal.  BNP normal.  CBC unremarkable [JK]  1549 CXR shows left  lung opacity and effusion [JK]  1622 DVT study negative , baker cyst noted  [JK]    Clinical Course User Index [JK] Trish Furl, MD                                 Medical Decision Making Problems Addressed: Chest pain, unspecified type: acute illness or injury that poses a threat to life or bodily functions  Amount and/or Complexity of Data Reviewed Labs: ordered. Decision-making details documented in ED Course. Radiology: ordered and independent interpretation performed.  Risk OTC drugs. Prescription drug management. Decision regarding hospitalization.   Pt presented to the ED for chest pain.  Consider the possibility of ACS, aortic pneumonia pneumothorax Initial troponin is normal.  CT angiogram does not show any evidence of aortic dissection.  Stable aneurysm noted.  Delta troponin pending at time of shift change.  Care turned over to Dr Ames Bakes at shift change.  Anticipate admission to the hospital for further workup       Final Clinical Impression(s) / ED Diagnoses Final diagnoses:  None    Rx / DC Orders ED Discharge Orders     None         Trish Furl, MD 05/06/24 1624

## 2024-05-06 NOTE — H&P (Addendum)
 History and Physical   [name] ZOX:[id number] DOB: [birth year] DOA: 05/06/2024  PCP: Tena Feeling, MD   Patient coming from: Home  Chief Complaint: Chest pain  HPI: [name] is a 89 y.o. female with medical history significant of hypertension, hypothyroidism, atrial fibrillation, SVT, pacemaker, mitral valve prolapse, chronic diastolic CHF, breast cancer, incontinence, ILD, chronic respiratory failure presenting with chest pain.  Patient has experienced about 1 day of chest pain which is persistent and mildly improved today.  Also reports some lower extremity edema.  And left lower extremity redness.  Denies fevers, chills, abdominal pain, constipation, diarrhea.  ED Course: Vital signs in the ED notable for blood pressure in the 140s to 160s systolic, respiratory rate in the 20s.  Lab workup included BMP within normal limits.  CBC with hemoglobin stable 11.2.  Troponin normal with repeat pending.  BNP pending.  Chest x-ray showed left lower lobe opacification and effusion with cardiomegaly and [name].  CTA of the chest/aorta showed small bilateral effusion with right-sided opacity favoring atelectasis over infection, cardiomegaly, stable ascending aorta.  Patient received aspirin, Norco, Lasix  in the ED.  Cardiology was consulted.  Review of Systems: As per HPI otherwise all other systems reviewed and are negative.  Past Medical History:  Diagnosis Date   Allergic rhinitis    Arthritis    bil knees   Atypical atrial flutter (HCC) 04/22/2010   Qualifier: Diagnosis of   By: Sallyanne Creamer CMA, Jewel         Breast cancer, left breast (HCC) 01/15/2015   Invasive Mammary   Chronic venous insufficiency    Hematuria    negative workup - Dr. Theotis Flake   Hypertension    Hypothyroidism    Migraine headache    MVP (mitral valve prolapse)    OAB (overactive bladder)    Pacemaker    PAF (paroxysmal atrial fibrillation) (HCC)    SVT (supraventricular tachycardia) (HCC)  06/2010   s/p AVNRT ablation   Wears glasses     Past Surgical History:  Procedure Laterality Date   A-FLUTTER ABLATION N/A 02/04/2017   Procedure: A-Flutter Ablation;  Surgeon: Tammie Fall, MD;  Location: [facility] INVASIVE CV LAB;  Service: Cardiovascular;  Laterality: N/A;   ABDOMINAL HYSTERECTOMY     AV NODE ABLATION N/A 08/17/2017   Procedure: AV Node Ablation;  Surgeon: Tammie Fall, MD;  Location: [facility] INVASIVE CV LAB;  Service: Cardiovascular;  Laterality: N/A;   BREAST LUMPECTOMY WITH RADIOACTIVE SEED LOCALIZATION Left 02/14/2015   Procedure: BREAST LUMPECTOMY WITH RADIOACTIVE SEED LOCALIZATION;  Surgeon: Juanita Norlander, MD;  Location: [facility];  Service: General;  Laterality: Left;   BREAST LUMPECTOMY WITH RADIOACTIVE SEED LOCALIZATION Left 11/18/2021   Procedure: LEFT BREAST LUMPECTOMY WITH RADIOACTIVE SEED LOCALIZATION;  Surgeon: Sim Dryer, MD;  Location: [facility];  Service: General;  Laterality: Left;   BREAST SURGERY  1990   rt br bx   CARDIAC CATHETERIZATION  2011   ablasion   CARDIOVERSION N/A 08/10/2017   Procedure: CARDIOVERSION;  Surgeon: Hugh Madura, MD;  Location: [facility] ENDOSCOPY;  Service: Cardiovascular;  Laterality: N/A;   COLONOSCOPY     DILATION AND CURETTAGE OF UTERUS     Left Breast Biopsy  01/15/15   PACEMAKER IMPLANT N/A 08/17/2017   Procedure: Pacemaker Implant;  Surgeon: Tammie Fall, MD;  Location: [facility] INVASIVE CV LAB;  Service: Cardiovascular;  Laterality: N/A;   TUBAL LIGATION      Social History  reports that she has never smoked. She has never used smokeless tobacco. She reports current alcohol  use. She reports that she does not use drugs.  Allergies  Allergen Reactions   Adhesive [Tape] Rash and Other (See Comments)    Including EKG electrodes and the like   Codeine Other (See Comments)    Reaction not recalled   Latex Itching   Oxycodone-Aspirin Nausea And Vomiting   Percodan [Oxycodone-Aspirin] Nausea And  Vomiting   Quinolones     Patient with ATAA (ascending thoracic aortic aneurysm)    Family History  Problem Relation Age of Onset   Hypertension Mother    Heart disease Mother    Arthritis Mother    Other Father        hardening of the arteries   Heart Problems Sister        pacemaker   Heart attack Brother    Throat cancer Paternal Uncle    Breast cancer Sister        early 84s   Alzheimer's disease Sister    Pulmonary disease Sister    Breast cancer Daughter 72       Negative genetic testing    Breast cancer Paternal Aunt        dx over 22   Cancer Paternal Aunt        cancer in her leg   Breast cancer Cousin        multiple paternal cousin  Reviewed on admission  Prior to Admission medications   Medication Sig Start Date End Date Taking? Authorizing Provider  apixaban  (ELIQUIS ) 5 MG TABS tablet Take 1 tablet (5 mg total) by mouth 2 (two) times daily. 10/04/23  Yes Tammie Fall, MD  budesonide -formoterol  (SYMBICORT ) 160-4.5 MCG/ACT inhaler Inhale 2 puffs into the lungs 2 (two) times daily. 02/01/24  Yes Quillian Brunt, MD  clobetasol cream (TEMOVATE) 0.05 % Apply 1 Application topically 2 (two) times daily. 04/26/24  Yes [provider]  furosemide  (LASIX ) 40 MG tablet Take 40 mg by mouth daily.   Yes [provider]  levothyroxine  (SYNTHROID ) 125 MCG tablet Take 125 mcg by mouth daily before breakfast. 05/30/23  Yes [provider]  losartan  (COZAAR ) 50 MG tablet Take 1 tablet (50 mg total) by mouth daily. 03/29/23  Yes Tammie Fall, MD  metoprolol  succinate (TOPROL -XL) 25 MG 24 hr tablet Take 1 tablet (25 mg total) by mouth daily. 05/18/23  Yes Tammie Fall, MD  pentoxifylline (TRENTAL) 400 MG CR tablet Take 400 mg by mouth daily. 04/26/24  Yes [provider]  Spacer/Aero-Holding Chambers (AEROCHAMBER PLUS) Device Needs Aerochamber with Inhalers 12/12/23  Yes Sheikh, Omair Latif, DO  Vibegron  (GEMTESA ) 75 MG TABS Take 1 tablet (75  mg total) by mouth daily. 03/27/24  Yes Wyonia Hefty T, MD  acetaminophen  (TYLENOL ) 325 MG tablet Take 2 tablets (650 mg total) by mouth every 6 (six) hours as needed for mild pain (pain score 1-3) (or Fever >/= 101). 12/12/23   Sheikh, Omair Latif, DO  Carboxymethylcellul-Glycerin (LUBRICATING EYE DROPS OP) Place 1 drop into both eyes 3 (three) times daily as needed (for dryness).    [provider]  estradiol  (ESTRACE ) 0.1 MG/GM vaginal cream Place 0.5g nightly for two weeks then twice a week after 03/29/24   Darlene Ehlers, MD  mupirocin ointment (BACTROBAN) 2 % Apply 1 Application topically See admin instructions. Apply to affected area/ulcer on the right leg once a day    [provider]  triamcinolone  cream (KENALOG ) 0.1 % Apply 1 Application topically daily as needed (for rashes- affected areas). 12/15/21   [provider]  Vibegron  (GEMTESA ) 75 MG TABS Take 1 tablet (75 mg total) by mouth daily. Patient not taking: Reported on 05/06/2024 03/27/24   Wyonia Hefty T, MD  Vitamin D, Ergocalciferol, (DRISDOL) 50000 units CAPS capsule Take 50,000 Units by mouth every Thursday.    [provider]    Physical Exam: Vitals:   05/06/24 1248 05/06/24 1500 05/06/24 1540 05/06/24 1801  BP:  (!) 159/88  (!) 167/98  Pulse: 71 71    Resp: 19 (!) 24  20  Temp:   98 F (36.7 C) 97.7 F (36.5 C)  TempSrc:   Oral Oral  SpO2: 96% 100%  100%  Weight:      Height:        Physical Exam Constitutional:      General: She is not in acute distress.    Appearance: Normal appearance.  HENT:     Head: Normocephalic and atraumatic.     Mouth/Throat:     Mouth: Mucous membranes are moist.     Pharynx: Oropharynx is clear.  Eyes:     Extraocular Movements: Extraocular movements intact.     Pupils: Pupils are equal, round, and reactive to light.  Cardiovascular:     Rate and Rhythm: Normal rate and regular rhythm.     Pulses: Normal pulses.     Heart sounds: Normal heart sounds.   Pulmonary:     Effort: Pulmonary effort is normal. No respiratory distress.     Breath sounds: Rales present.  Abdominal:     General: Bowel sounds are normal. There is no distension.     Palpations: Abdomen is soft.     Tenderness: There is no abdominal tenderness.  Musculoskeletal:        General: No swelling or deformity.     Right lower leg: Edema present.     Left lower leg: Edema (L>R) present.  Skin:    General: Skin is warm and dry.  Neurological:     General: No focal deficit present.     Mental Status: Mental status is at baseline.    Labs on Admission: I have personally reviewed following labs and imaging studies  CBC: Recent Labs  Lab 05/06/24 1155  WBC 8.6  HGB 11.2*  HCT 35.2*  MCV 97.5  PLT 233    Basic Metabolic Panel: Recent Labs  Lab 05/06/24 1155  NA 140  K 3.9  CL 106  CO2 24  GLUCOSE 89  BUN 19  CREATININE 0.90  CALCIUM 9.0    GFR: Estimated Creatinine Clearance: 42 mL/min (by C-G formula based on SCr of 0.9 mg/dL).  Liver Function Tests: No results for input(s): "AST", "ALT", "ALKPHOS", "BILITOT", "PROT", "ALBUMIN" in the last 168 hours.  Urine analysis:    Component Value Date/Time   COLORURINE YELLOW 09/29/2023 1114   APPEARANCEUR CLEAR 09/29/2023 1114   LABSPEC 1.016 09/29/2023 1114   PHURINE 5.0 09/29/2023 1114   GLUCOSEU NEGATIVE 09/29/2023 1114   HGBUR SMALL (A) 09/29/2023 1114   BILIRUBINUR Small 03/27/2024 1204   KETONESUR NEGATIVE 09/29/2023 1114   KETONESUR negative 09/29/2023 1041   PROTEINUR Positive (A) 03/27/2024 1204   PROTEINUR NEGATIVE 09/29/2023 1114   UROBILINOGEN 1.0 03/27/2024 1204   UROBILINOGEN 0.2 03/28/2010 2217   NITRITE Negative 03/27/2024 1204   NITRITE NEGATIVE 09/29/2023 1114   LEUKOCYTESUR Negative 03/27/2024 1204   LEUKOCYTESUR NEGATIVE 09/29/2023  1114    Radiological Exams on Admission: CT Angio Chest Aorta W and/or Wo Contrast Result Date: 05/06/2024 CLINICAL DATA:  Suspect acute  aortic syndrome. EXAM: CT ANGIOGRAPHY CHEST WITH CONTRAST TECHNIQUE: Multidetector CT imaging of the chest was performed using the standard protocol during bolus administration of intravenous contrast. Multiplanar CT image reconstructions and MIPs were obtained to evaluate the vascular anatomy. RADIATION DOSE REDUCTION: This exam was performed according to the departmental dose-optimization program which includes automated exposure control, adjustment of the mA and/or kV according to patient size and/or use of iterative reconstruction technique. CONTRAST:  75mL OMNIPAQUE IOHEXOL 350 MG/ML SOLN COMPARISON:  12/09/2023 FINDINGS: Cardiovascular: Dual lead left-sided cardiac pacemaker. Persistent cardiomegaly with evidence of biatrial enlargement. Ascending thoracic aorta measures approximately 4.2 cm in greatest diameter without significant change. No evidence of dissection. Mild calcified plaque over the descending thoracic aorta. Normal 3 vessel takeoff of the aortic arch. Poor opacification over the pulmonary arterial system. Remaining vascular structures are unremarkable. Mediastinum/Nodes: No mediastinal or hilar adenopathy. Remaining mediastinal structures are unremarkable. Lungs/Pleura: Lungs are adequately inflated demonstrate small bilateral pleural effusions right greater than left with minimal patchy posterior right base opacification likely atelectasis although could not exclude early infection. Mild linear atelectasis over the lingula. Mild scarring over the posterior apices. Airways are unremarkable. Upper Abdomen: Minimal calcified plaque over the abdominal aorta. No acute findings in the upper abdomen. Musculoskeletal: No acute findings. Review of the MIP images confirms the above findings. IMPRESSION: 1. Small bilateral pleural effusions right greater than left with minimal patchy posterior right base opacification likely atelectasis, although could not exclude early infection. 2. Stable cardiomegaly  with evidence of biatrial enlargement. 3. Stable ascending thoracic aorta measuring approximately 4.2 cm in greatest diameter. No evidence of aortic dissection. Recommend annual imaging followup by CTA or MRA. This recommendation follows 2010 ACCF/AHA/AATS/ACR/ASA/SCA/SCAI/SIR/STS/SVM Guidelines for the Diagnosis and Management of Patients with Thoracic Aortic Disease. Circulation. 2010; 121: N829-F621. Aortic aneurysm NOS (ICD10-I71.9). 4. Aortic atherosclerosis. Aortic Atherosclerosis (ICD10-I70.0). Electronically Signed   By: Roda Cirri M.D.   On: 05/06/2024 15:49   DG Chest 2 View Result Date: 05/06/2024 CLINICAL DATA:  Chest pain EXAM: CHEST - 2 VIEW COMPARISON:  X-ray 03/14/2024 and older FINDINGS: Underinflation. Enlarged cardiopericardial silhouette with calcified tortuous aorta. Increasing left retrocardiac opacity and effusion. Apical pleural thickening. Left upper chest pacemaker. No edema. Interstitial changes are again noted. Overlapping cardiac leads. Films are under penetrated. Likely tiny right effusion as well. Surgical changes along the left humeral head. IMPRESSION: Increasing left lung base opacity and effusion. Tiny right effusion. Enlarged cardiopericardial silhouette vascular Green. Pacemaker. Recommend follow-up Electronically Signed   By: Adrianna Horde M.D.   On: 05/06/2024 12:36   EKG: Independently reviewed.  Paced rhythm at 74 bpm.  Nonspecific T wave changes minimal baseline wander.  Baseline artifact.  Assessment/Plan Principal Problem:   Chest pain, rule out acute myocardial infarction Active Problems:   Essential hypertension   PAF (paroxysmal atrial fibrillation) (HCC)   Hypothyroidism   ILD (interstitial lung disease) (HCC)   Pacemaker   Chronic respiratory failure with hypoxia (HCC)   Acute on chronic diastolic CHF (congestive heart failure) (HCC)   Chest pain, rule out ACS > Presenting with 1 day of chest pain that is pressure-like and persistent. >  Initial troponin flat. > Cardiology consulted - Appreciate cardiology recommendations - Monitor on telemetry - Trend troponin - Check lipid panel - Echocardiogram  Chronic diastolic CHF Rule out exacerbation > Some increased edema,  had been on increased Lasix  until recently. > BNP pending. > Last echo was in 2023 with EF 60-60%, indeterminate diastolic function, normal RV function. - Monitor on telemetry - Appreciate cardiology recommendations - Received a dose of Lasix  in the ED, hold off on further IV Lasix  pending initial workup - Follow-up BNP - Trend renal function and electrolytes - Check magnesium  - Strict I's and O's, daily weights any ADDENDUM A/C dCHF > BNP elevated. L > Edema (but on eliquis ). - Continue with lasix  - Continue with ECHO - DVT study pending  Hypertension - Lasix  given as above - Continue losartan  and metoprolol   Hypothyroidism - Continue Synthroid   History of SVT Atrial fibrillation Status post pacemaker - Paced rhythm in the ED - Continue home Eliquis , metoprolol   History of breast cancer - Noted  Incontinence - Continue home medication  ILD > Thought to be 2/2 Amio. - Replacement Symbicort  formulary Breo  DVT prophylaxis: Eliquis  Code Status:   Full Family Communication:  Updated at bedside  Disposition Plan:   Patient is from:  Home  Anticipated DC to:  Home  Anticipated DC date:  1-3 days  Anticipated DC barriers: None  Consults called:  Cardiology Admission status:  Observation, telemetry  Severity of Illness: The appropriate patient status for this patient is OBSERVATION. Observation status is judged to be reasonable and necessary in order to provide the required intensity of service to ensure the patient's safety. The patient's presenting symptoms, physical exam findings, and initial radiographic and laboratory data in the context of their medical condition is felt to place them at decreased risk for further clinical  deterioration. Furthermore, it is anticipated that the patient will be medically stable for discharge from the hospital within 2 midnights of admission.    Johnetta Nab MD [facility]  How to contact the [facility] Attending or Consulting provider 7A - 7P or covering provider during after hours 7P -7A, for this patient?   Check the care team in [facility] and look for a) attending/consulting [facility] provider listed and b) the [facility] team listed Log into [email or web address] and use [place]'s universal password to access. If you do not have the password, please contact the hospital operator. Locate the [facility] provider you are looking for under [facility] and page to a number that you can be directly reached. If you still have difficulty reaching the provider, please page the [facility] (Director on Call) for the Hospitalists listed on amion for assistance.  05/06/2024, 6:13 PM

## 2024-05-06 NOTE — Plan of Care (Signed)
   Problem: Activity: Goal: Ability to tolerate increased activity will improve Outcome: Progressing   Problem: Cardiac: Goal: Ability to achieve and maintain adequate cardiovascular perfusion will improve Outcome: Progressing

## 2024-05-07 ENCOUNTER — Observation Stay (HOSPITAL_COMMUNITY)

## 2024-05-07 DIAGNOSIS — J9611 Chronic respiratory failure with hypoxia: Secondary | ICD-10-CM

## 2024-05-07 DIAGNOSIS — I340 Nonrheumatic mitral (valve) insufficiency: Secondary | ICD-10-CM | POA: Diagnosis not present

## 2024-05-07 DIAGNOSIS — I10 Essential (primary) hypertension: Secondary | ICD-10-CM | POA: Diagnosis not present

## 2024-05-07 DIAGNOSIS — I110 Hypertensive heart disease with heart failure: Secondary | ICD-10-CM | POA: Diagnosis not present

## 2024-05-07 DIAGNOSIS — Z91040 Latex allergy status: Secondary | ICD-10-CM | POA: Diagnosis not present

## 2024-05-07 DIAGNOSIS — I341 Nonrheumatic mitral (valve) prolapse: Secondary | ICD-10-CM | POA: Diagnosis present

## 2024-05-07 DIAGNOSIS — I5033 Acute on chronic diastolic (congestive) heart failure: Secondary | ICD-10-CM | POA: Diagnosis not present

## 2024-05-07 DIAGNOSIS — R072 Precordial pain: Secondary | ICD-10-CM | POA: Diagnosis not present

## 2024-05-07 DIAGNOSIS — R079 Chest pain, unspecified: Secondary | ICD-10-CM | POA: Diagnosis not present

## 2024-05-07 DIAGNOSIS — Z79890 Hormone replacement therapy (postmenopausal): Secondary | ICD-10-CM | POA: Diagnosis not present

## 2024-05-07 DIAGNOSIS — I4892 Unspecified atrial flutter: Secondary | ICD-10-CM | POA: Diagnosis not present

## 2024-05-07 DIAGNOSIS — J704 Drug-induced interstitial lung disorders, unspecified: Secondary | ICD-10-CM | POA: Diagnosis not present

## 2024-05-07 DIAGNOSIS — I480 Paroxysmal atrial fibrillation: Secondary | ICD-10-CM | POA: Diagnosis not present

## 2024-05-07 DIAGNOSIS — I493 Ventricular premature depolarization: Secondary | ICD-10-CM | POA: Diagnosis not present

## 2024-05-07 DIAGNOSIS — T462X5S Adverse effect of other antidysrhythmic drugs, sequela: Secondary | ICD-10-CM | POA: Diagnosis not present

## 2024-05-07 DIAGNOSIS — E039 Hypothyroidism, unspecified: Secondary | ICD-10-CM | POA: Diagnosis not present

## 2024-05-07 DIAGNOSIS — Z7901 Long term (current) use of anticoagulants: Secondary | ICD-10-CM | POA: Diagnosis not present

## 2024-05-07 DIAGNOSIS — I959 Hypotension, unspecified: Secondary | ICD-10-CM | POA: Diagnosis not present

## 2024-05-07 DIAGNOSIS — I4819 Other persistent atrial fibrillation: Secondary | ICD-10-CM | POA: Diagnosis not present

## 2024-05-07 DIAGNOSIS — I7121 Aneurysm of the ascending aorta, without rupture: Secondary | ICD-10-CM | POA: Diagnosis not present

## 2024-05-07 DIAGNOSIS — Z79899 Other long term (current) drug therapy: Secondary | ICD-10-CM | POA: Diagnosis not present

## 2024-05-07 DIAGNOSIS — I5043 Acute on chronic combined systolic (congestive) and diastolic (congestive) heart failure: Secondary | ICD-10-CM | POA: Diagnosis not present

## 2024-05-07 DIAGNOSIS — I200 Unstable angina: Secondary | ICD-10-CM | POA: Diagnosis not present

## 2024-05-07 DIAGNOSIS — N390 Urinary tract infection, site not specified: Secondary | ICD-10-CM | POA: Diagnosis not present

## 2024-05-07 DIAGNOSIS — M940 Chondrocostal junction syndrome [Tietze]: Secondary | ICD-10-CM | POA: Diagnosis not present

## 2024-05-07 DIAGNOSIS — Z950 Presence of cardiac pacemaker: Secondary | ICD-10-CM | POA: Diagnosis not present

## 2024-05-07 DIAGNOSIS — Z7951 Long term (current) use of inhaled steroids: Secondary | ICD-10-CM | POA: Diagnosis not present

## 2024-05-07 LAB — COMPREHENSIVE METABOLIC PANEL WITH GFR
ALT: 10 U/L (ref 0–44)
AST: 18 U/L (ref 15–41)
Albumin: 3 g/dL — ABNORMAL LOW (ref 3.5–5.0)
Alkaline Phosphatase: 72 U/L (ref 38–126)
Anion gap: 10 (ref 5–15)
BUN: 15 mg/dL (ref 8–23)
CO2: 24 mmol/L (ref 22–32)
Calcium: 8.5 mg/dL — ABNORMAL LOW (ref 8.9–10.3)
Chloride: 104 mmol/L (ref 98–111)
Creatinine, Ser: 0.86 mg/dL (ref 0.44–1.00)
GFR, Estimated: >60 mL/min (ref >60)
Glucose, Bld: 95 mg/dL (ref 70–99)
Potassium: 3.5 mmol/L (ref 3.5–5.1)
Sodium: 138 mmol/L (ref 135–145)
Total Bilirubin: 0.6 mg/dL (ref 0.0–1.2)
Total Protein: 6.2 g/dL — ABNORMAL LOW (ref 6.5–8.1)

## 2024-05-07 LAB — ECHOCARDIOGRAM COMPLETE
Area-P 1/2: 5.7 cm2
Height: 67.5 in
Weight: 2610.25 [oz_av]

## 2024-05-07 LAB — LIPID PANEL
Cholesterol: 157 mg/dL (ref 0–200)
HDL: 59 mg/dL (ref >40)
LDL Cholesterol: 81 mg/dL (ref 0–99)
Total CHOL/HDL Ratio: 2.7 ratio
Triglycerides: 85 mg/dL (ref <150)
VLDL: 17 mg/dL (ref 0–40)

## 2024-05-07 LAB — CBC
HCT: 34.8 % — ABNORMAL LOW (ref 36.0–46.0)
Hemoglobin: 11.2 g/dL — ABNORMAL LOW (ref 12.0–15.0)
MCH: 30.4 pg (ref 26.0–34.0)
MCHC: 32.2 g/dL (ref 30.0–36.0)
MCV: 94.6 fL (ref 80.0–100.0)
Platelets: 251 10*3/uL (ref 150–400)
RBC: 3.68 MIL/uL — ABNORMAL LOW (ref 3.87–5.11)
RDW: 14.6 % (ref 11.5–15.5)
WBC: 7.6 10*3/uL (ref 4.0–10.5)
nRBC: 0 % (ref 0.0–0.2)

## 2024-05-07 MED ORDER — POTASSIUM CHLORIDE CRYS ER 10 MEQ PO TBCR
40.0000 meq | EXTENDED_RELEASE_TABLET | Freq: Once | ORAL | Status: AC
  Administered 2024-05-07: 40 meq via ORAL
  Filled 2024-05-07: qty 4

## 2024-05-07 MED ORDER — FUROSEMIDE 10 MG/ML IJ SOLN
20.0000 mg | Freq: Once | INTRAMUSCULAR | Status: AC
  Administered 2024-05-07: 20 mg via INTRAVENOUS
  Filled 2024-05-07: qty 2

## 2024-05-07 MED ORDER — PERFLUTREN LIPID MICROSPHERE
1.0000 mL | INTRAVENOUS | Status: AC | PRN
  Administered 2024-05-07: 3 mL via INTRAVENOUS

## 2024-05-07 MED ORDER — SODIUM CHLORIDE 0.9 % IV SOLN
1.0000 g | INTRAVENOUS | Status: DC
  Administered 2024-05-07 – 2024-05-09 (×3): 1 g via INTRAVENOUS
  Filled 2024-05-07 (×3): qty 10

## 2024-05-07 MED ORDER — FUROSEMIDE 10 MG/ML IJ SOLN
60.0000 mg | Freq: Two times a day (BID) | INTRAMUSCULAR | Status: DC
  Administered 2024-05-07: 60 mg via INTRAVENOUS
  Filled 2024-05-07: qty 6

## 2024-05-07 MED ORDER — SPIRONOLACTONE 25 MG PO TABS
25.0000 mg | ORAL_TABLET | Freq: Every day | ORAL | Status: DC
  Administered 2024-05-07 – 2024-05-10 (×4): 25 mg via ORAL
  Filled 2024-05-07 (×4): qty 1

## 2024-05-07 NOTE — Progress Notes (Signed)
 Mobility Specialist Progress Note;   05/07/24 1057  Mobility  Activity Ambulated with assistance in hallway;Transferred to/from [address] [facility]  Level of Assistance Standby assist, set-up cues, supervision of patient - no hands on  Assistive Device Front wheel walker  Distance Ambulated (ft) 200 ft  Activity Response Tolerated well  Mobility Referral Yes  Mobility visit 1 Mobility  Mobility Specialist Start Time (ACUTE ONLY) 1057  Mobility Specialist Stop Time (ACUTE ONLY) 1113  Mobility Specialist Time Calculation (min) (ACUTE ONLY) 16 min   Pt agreeable to mobility. Requested assistance to [facility], void successful. Required no physical assistance during ambulation. VSS throughout and no c/o when asked. Pt returned back to bed with all needs met, alarm on. Husband in room.   Janit Meline Mobility Specialist Please contact via SecureChat or [employer] [phone]

## 2024-05-07 NOTE — Progress Notes (Signed)
 Cardiology Progress Note  Patient ID: [name] MRN: [id number] DOB: July 31, 1933 Date of Encounter: 05/07/2024 Primary Cardiologist: None  Subjective   Chief Complaint: SOB  HPI: Chest pressure improved. Breathing improved. -800cc. Still volume up.   ROS:  All other ROS reviewed and negative. Pertinent positives noted in the HPI.     Telemetry  Overnight telemetry shows V paced 70s, which I personally reviewed.    Physical Exam   Vitals:   05/07/24 0028 05/07/24 0443 05/07/24 0746 05/07/24 0809  BP: 133/79 121/71 134/75   Pulse: 70 75  74  Resp: 18 18 18 18   Temp: (!) 97.5 F (36.4 C) 97.9 F (36.6 C) 97.8 F (36.6 C)   TempSrc: Oral Oral Oral   SpO2: 98% 94%  95%  Weight:  74 kg    Height:        Intake/Output Summary (Last 24 hours) at 05/07/2024 1011 Last data filed at 05/07/2024 4098 Gross per 24 hour  Intake 240 ml  Output 1200 ml  Net -960 ml       05/07/2024    4:43 AM 05/06/2024   11:53 AM 04/23/2024    3:12 PM  Last 3 Weights  Weight (lbs) 163 lb 2.3 oz 165 lb 161 lb  Weight (kg) 74 kg 74.844 kg 73.029 kg    Body mass index is 25.17 kg/m.  General: Well nourished, well developed, in no acute distress Head: Atraumatic, normal size  Eyes: PEERLA, EOMI  Neck: Supple, no JVD Endocrine: No thryomegaly Cardiac: Normal S1, S2; RRR; no murmurs, rubs, or gallops Lungs: +rales  Abd: Soft, nontender, no hepatomegaly  Ext: 2+ pitting edema up to knees  Musculoskeletal: No deformities, BUE and BLE strength normal and equal Skin: Warm and dry, no rashes   Neuro: Alert and oriented to person, place, time, and situation, CNII-XII grossly intact, no focal deficits  Psych: Normal mood and affect   Cardiac Studies  TTE 60-65%, no WMA, RVSP 39 mmHG  Patient Profile  [name] is a 89 y.o. female with persistent atrial fibrillation/flutter status post AV node ablation with dual-chamber pacemaker, chronic diastolic heart failure, hypertension,  hypothyroidism, thoracic aortic aneurysm admitted on 05/06/2024 for acute on chronic diastolic heart failure.  Assessment & Plan   # Acute on chronic diastolic heart failure - Admitted with chest pain and shortness of breath.  Described as pressure.  Worse with activity.  CTA with bilateral pleural effusions elevated BNP noted as well.  She is still very volume up.  2+ pitting edema up to the knees. - EKG appears nonischemic but is paced. - She overall seems to be improving with some diuresis.  Net -800 cc. - Given that symptoms have improved with diuresis I believe this is more of diastolic heart failure to explain her presentation.  Would like to add Aldactone 25 mg daily.  Increase Lasix  to 60 mg IV twice daily.  Add an additional 20 mg of Lasix  this morning.  I will give her potassium 40 mg as well.  Will and to keep her potassium magnesium  above 4 and 2 respectively. - Follow-up echocardiogram.  See discussion on chest discomfort below.  # Chest discomfort, ACS ruled out - Describes chest pressure.  Symptoms are actually improved with diuresis.  I believe this is likely related to diastolic heart failure. - Follow-up echo.  Continue with diuresis today.  May consider outpatient stress test but clinical presentation more consistent with diastolic heart failure. - I have reviewed  her chest CT.  She has very minimal coronary calcifications.  If she continues to have chest discomfort she would be a great candidate for inpatient coronary CTA.  However at this time we will see how she does with diuresis as above.  # Persistent atrial fibrillation/flutter # AV node ablation status post dual-chamber pacemaker - Stable.  Continue Eliquis .  Status post AV node ablation.  # Hypertension - Continue home losartan  50 mg daily, metoprolol  succinate 25 mg daily.  I have added Aldactone 25 mg daily.  This will help with diuresis and BP.  # UTI - Per primary team.  # Aortic aneurysm - 42 mm.  Likely  normal for age.  Unclear if she needs follow-up surveillance on this.  For questions or updates, please contact [facility] Please consult [email or web address] for contact info under   Signed, Melodee Spruce T. Rolm Clos, MD, [facility] [place]  [facility]  05/07/2024 10:11 AM

## 2024-05-07 NOTE — Care Management Obs Status (Signed)
 MEDICARE OBSERVATION STATUS NOTIFICATION   Patient Details  Name: [name] MRN: [id number] Date of Birth: 26-Dec-1932   Medicare Observation Status Notification Given:  Yes    Janith Melnick 05/07/2024, 9:48 AM

## 2024-05-07 NOTE — Hospital Course (Signed)
 [name] is a 89 y.o. female with a history of hypertension, hypothyroidism, atrial fibrillation/flutter status post AV nodal ablation and Medtronic pacemaker, heart failure, PVCs, thoracic aortic aneurysm.  Patient presented secondary to chest pain and admitted for chest pain/ACS rule out.  Cardiology consulted on admission.

## 2024-05-07 NOTE — Progress Notes (Signed)
 PROGRESS NOTE    [name]  OZH:[id number] DOB: 1933/07/02 DOA: 05/06/2024 PCP: Tena Feeling, MD   Brief Narrative: [name] is a 89 y.o. female with a history of hypertension, hypothyroidism, atrial fibrillation/flutter status post AV nodal ablation and Medtronic pacemaker, heart failure, PVCs, thoracic aortic aneurysm.  Patient presented secondary to chest pain and admitted for chest pain/ACS rule out.  Cardiology consulted on admission.   Assessment and Plan:  Chest pain Admitted for ACS rule out.  Neurology consulted.  EKG on admission significant for paced rhythm.  High-sensitivity troponin of 14 on admission with negative delta of 16.  Chest pain improved today. - Cardiology recommendations: Diuresis, possible coronary CT if symptoms don't improve with diuresis - Follow-up echocardiogram  Acute on chronic diastolic heart failure Patient with increased lower extremity edema in addition to pleural effusions noted on chest imaging.  And BNP of 439.  Patient with last LVEF of 60 to 65% from May 2023.  Patient started on Lasix  IV for diuresis. - Continue Lasix  IV - Follow-up echocardiogram results  Primary hypertension Patient is on losartan  and metoprolol  as an outpatient which were resumed on admission. - Continue losartan  and metoprolol  succinate  Hypothyroidism - Continue Synthroid  125 mcg daily  History of atrial fibrillation/flutter Patient is status post ablation and pacemaker placement.  Patient is on metoprolol  succinate and Eliquis  as an outpatient. - Continue metoprolol  succinate and Eliquis   Interstitial lung disease Presumed secondary to amiodarone  use, which is currently discontinued.  Patient is on Symbicort  as an outpatient. - Continue Breo Ellipta (substituted for home Symbicort )   DVT prophylaxis: Eliquis  Code Status:   Code Status: Full Code Family Communication: None Disposition Plan: Discharge home pending continued cardiology  recommendations   Consultants:  Cardiology  Procedures:  None  Antimicrobials: None    Subjective: Chest pain improved this morning. No issues from overnight.  Objective: BP 121/71 (BP Location: Left Arm)   Pulse 75   Temp 97.9 F (36.6 C) (Oral)   Resp 18   Ht 5' 7.5" (1.715 m)   Wt 74 kg   LMP  (LMP Unknown)   SpO2 94%   BMI 25.17 kg/m   Examination:  General exam: Appears calm and comfortable Respiratory system: Clear to auscultation. Respiratory effort normal. Cardiovascular system: S1 & S2 heard, RRR. No murmurs, rubs, gallops or clicks. Gastrointestinal system: Abdomen is nondistended, soft and nontender.  Normal bowel sounds heard. Central nervous system: Alert and oriented. No focal neurological deficits. Psychiatry: Judgement and insight appear normal. Mood & affect appropriate.    Data Reviewed: I have personally reviewed following labs and imaging studies  CBC Lab Results  Component Value Date   WBC 7.6 05/07/2024   RBC 3.68 (L) 05/07/2024   HGB 11.2 (L) 05/07/2024   HCT 34.8 (L) 05/07/2024   MCV 94.6 05/07/2024   MCH 30.4 05/07/2024   PLT 251 05/07/2024   MCHC 32.2 05/07/2024   RDW 14.6 05/07/2024   LYMPHSABS 0.9 12/12/2023   MONOABS 0.6 12/12/2023   EOSABS 0.0 12/12/2023   BASOSABS 0.0 12/12/2023     Last metabolic panel Lab Results  Component Value Date   NA 138 05/07/2024   K 3.5 05/07/2024   CL 104 05/07/2024   CO2 24 05/07/2024   BUN 15 05/07/2024   CREATININE 0.86 05/07/2024   GLUCOSE 95 05/07/2024   GFRNONAA >60 05/07/2024   GFRAA 65 09/01/2017   CALCIUM 8.5 (L) 05/07/2024   PHOS 3.1 12/12/2023  PROT 6.2 (L) 05/07/2024   ALBUMIN 3.0 (L) 05/07/2024   LABGLOB 3.0 09/30/2021   AGRATIO 1.5 09/30/2021   BILITOT 0.6 05/07/2024   ALKPHOS 72 05/07/2024   AST 18 05/07/2024   ALT 10 05/07/2024   ANIONGAP 10 05/07/2024    GFR: Estimated Creatinine Clearance: 44 mL/min (by C-G formula based on SCr of 0.86 mg/dL).  No  results found for this or any previous visit (from the past 240 hours).    Radiology Studies: VAS US  LOWER EXTREMITY VENOUS (DVT) (ONLY [facility] & [facility]) Result Date: 05/06/2024  Lower Venous DVT Study Patient Name:  [name]  Date of Exam:   05/06/2024 Medical Rec #: [id number]       Accession #:    [id number] Date of Birth: [birth year]       Patient Gender: F Patient Age:   89 years Exam Location:  [facility] Procedure:      VAS US  LOWER EXTREMITY VENOUS (DVT) Referring Phys: JON KNAPP --------------------------------------------------------------------------------  Indications: Swelling, and Pain.  Comparison Study: 01/31/2024 Performing Technologist: Gelene Kelly RDCS  Examination Guidelines: A complete evaluation includes B-mode imaging, spectral Doppler, color Doppler, and power Doppler as needed of all accessible portions of each vessel. Bilateral testing is considered an integral part of a complete examination. Limited examinations for reoccurring indications may be performed as noted. The reflux portion of the exam is performed with the patient in reverse Trendelenburg.  +-----+---------------+---------+-----------+----------+--------------+ RIGHTCompressibilityPhasicitySpontaneityPropertiesThrombus Aging +-----+---------------+---------+-----------+----------+--------------+ CFV  Full           Yes      Yes                                 +-----+---------------+---------+-----------+----------+--------------+   +---------+---------------+---------+-----------+----------+--------------+ LEFT     CompressibilityPhasicitySpontaneityPropertiesThrombus Aging +---------+---------------+---------+-----------+----------+--------------+ CFV      Full           Yes      Yes                                 +---------+---------------+---------+-----------+----------+--------------+ SFJ      Full           Yes      Yes                                  +---------+---------------+---------+-----------+----------+--------------+ FV Prox  Full           Yes      Yes                                 +---------+---------------+---------+-----------+----------+--------------+ FV Mid   Full           Yes      Yes                                 +---------+---------------+---------+-----------+----------+--------------+ FV DistalFull           Yes      Yes                                 +---------+---------------+---------+-----------+----------+--------------+ PFV      Full                                                        +---------+---------------+---------+-----------+----------+--------------+  POP      Full           Yes      Yes                                 +---------+---------------+---------+-----------+----------+--------------+ PTV      Full           Yes      Yes                                 +---------+---------------+---------+-----------+----------+--------------+ PERO     Full           Yes      Yes                                 +---------+---------------+---------+-----------+----------+--------------+ Gastroc  Full                                                        +---------+---------------+---------+-----------+----------+--------------+ GSV      Full           Yes      Yes                                 +---------+---------------+---------+-----------+----------+--------------+   Findings reported to Dr. Monnie Anthony at 3:30.  Summary: RIGHT: - No evidence of common femoral vein obstruction.   LEFT: - No evidence of deep vein thrombosis in the lower extremity. No indirect evidence of obstruction proximal to the inguinal ligament.  - No cystic structure found in the popliteal fossa.  *See table(s) above for measurements and observations. Electronically signed by Irvin Mantel on 05/06/2024 at 7:42:32 PM.    Final    CT Angio Chest Aorta W and/or Wo Contrast Result Date:  05/06/2024 CLINICAL DATA:  Suspect acute aortic syndrome. EXAM: CT ANGIOGRAPHY CHEST WITH CONTRAST TECHNIQUE: Multidetector CT imaging of the chest was performed using the standard protocol during bolus administration of intravenous contrast. Multiplanar CT image reconstructions and MIPs were obtained to evaluate the vascular anatomy. RADIATION DOSE REDUCTION: This exam was performed according to the departmental dose-optimization program which includes automated exposure control, adjustment of the mA and/or kV according to patient size and/or use of iterative reconstruction technique. CONTRAST:  75mL OMNIPAQUE IOHEXOL 350 MG/ML SOLN COMPARISON:  12/09/2023 FINDINGS: Cardiovascular: Dual lead left-sided cardiac pacemaker. Persistent cardiomegaly with evidence of biatrial enlargement. Ascending thoracic aorta measures approximately 4.2 cm in greatest diameter without significant change. No evidence of dissection. Mild calcified plaque over the descending thoracic aorta. Normal 3 vessel takeoff of the aortic arch. Poor opacification over the pulmonary arterial system. Remaining vascular structures are unremarkable. Mediastinum/Nodes: No mediastinal or hilar adenopathy. Remaining mediastinal structures are unremarkable. Lungs/Pleura: Lungs are adequately inflated demonstrate small bilateral pleural effusions right greater than left with minimal patchy posterior right base opacification likely atelectasis although could not exclude early infection. Mild linear atelectasis over the lingula. Mild scarring over the posterior apices. Airways are unremarkable. Upper Abdomen: Minimal calcified plaque over the abdominal aorta. No acute findings in the upper abdomen. Musculoskeletal: No acute  findings. Review of the MIP images confirms the above findings. IMPRESSION: 1. Small bilateral pleural effusions right greater than left with minimal patchy posterior right base opacification likely atelectasis, although could not exclude  early infection. 2. Stable cardiomegaly with evidence of biatrial enlargement. 3. Stable ascending thoracic aorta measuring approximately 4.2 cm in greatest diameter. No evidence of aortic dissection. Recommend annual imaging followup by CTA or MRA. This recommendation follows 2010 ACCF/AHA/AATS/ACR/ASA/SCA/SCAI/SIR/STS/SVM Guidelines for the Diagnosis and Management of Patients with Thoracic Aortic Disease. Circulation. 2010; 121: W098-J191. Aortic aneurysm NOS (ICD10-I71.9). 4. Aortic atherosclerosis. Aortic Atherosclerosis (ICD10-I70.0). Electronically Signed   By: Roda Cirri M.D.   On: 05/06/2024 15:49   DG Chest 2 View Result Date: 05/06/2024 CLINICAL DATA:  Chest pain EXAM: CHEST - 2 VIEW COMPARISON:  X-ray 03/14/2024 and older FINDINGS: Underinflation. Enlarged cardiopericardial silhouette with calcified tortuous aorta. Increasing left retrocardiac opacity and effusion. Apical pleural thickening. Left upper chest pacemaker. No edema. Interstitial changes are again noted. Overlapping cardiac leads. Films are under penetrated. Likely tiny right effusion as well. Surgical changes along the left humeral head. IMPRESSION: Increasing left lung base opacity and effusion. Tiny right effusion. Enlarged cardiopericardial silhouette vascular congestion. Pacemaker. Recommend follow-up Electronically Signed   By: Adrianna Horde M.D.   On: 05/06/2024 12:36      LOS: 0 days    Aneita Keens, MD [facility] 05/07/2024, 7:40 AM   If 7PM-7AM, please contact night-coverage [email or web address]

## 2024-05-07 NOTE — Progress Notes (Signed)
 Echocardiogram 2D Echocardiogram has been performed.  Emmaline Haring Brayleigh Rybacki RDCS 05/07/2024, 1:11 PM

## 2024-05-08 DIAGNOSIS — I5033 Acute on chronic diastolic (congestive) heart failure: Secondary | ICD-10-CM | POA: Diagnosis not present

## 2024-05-08 DIAGNOSIS — I4819 Other persistent atrial fibrillation: Secondary | ICD-10-CM | POA: Diagnosis not present

## 2024-05-08 DIAGNOSIS — J9611 Chronic respiratory failure with hypoxia: Secondary | ICD-10-CM | POA: Diagnosis not present

## 2024-05-08 DIAGNOSIS — I5043 Acute on chronic combined systolic (congestive) and diastolic (congestive) heart failure: Secondary | ICD-10-CM

## 2024-05-08 DIAGNOSIS — I10 Essential (primary) hypertension: Secondary | ICD-10-CM | POA: Diagnosis not present

## 2024-05-08 DIAGNOSIS — I200 Unstable angina: Secondary | ICD-10-CM

## 2024-05-08 LAB — BASIC METABOLIC PANEL WITH GFR
Anion gap: 9 (ref 5–15)
BUN: 18 mg/dL (ref 8–23)
CO2: 27 mmol/L (ref 22–32)
Calcium: 8.7 mg/dL — ABNORMAL LOW (ref 8.9–10.3)
Chloride: 103 mmol/L (ref 98–111)
Creatinine, Ser: 1.19 mg/dL — ABNORMAL HIGH (ref 0.44–1.00)
GFR, Estimated: 44 mL/min — ABNORMAL LOW (ref >60)
Glucose, Bld: 151 mg/dL — ABNORMAL HIGH (ref 70–99)
Potassium: 3.5 mmol/L (ref 3.5–5.1)
Sodium: 139 mmol/L (ref 135–145)

## 2024-05-08 LAB — URINE CULTURE: Culture: <10000 — AB

## 2024-05-08 LAB — MAGNESIUM: Magnesium: 2 mg/dL (ref 1.7–2.4)

## 2024-05-08 MED ORDER — ASPIRIN 81 MG PO CHEW
81.0000 mg | CHEWABLE_TABLET | ORAL | Status: AC
  Administered 2024-05-09: 81 mg via ORAL
  Filled 2024-05-08: qty 1

## 2024-05-08 MED ORDER — SODIUM CHLORIDE 0.9 % IV SOLN
INTRAVENOUS | Status: DC
Start: 2024-05-09 — End: 2024-05-10

## 2024-05-08 NOTE — Progress Notes (Signed)
 PROGRESS NOTE    [name]  WUJ:[id number] DOB: 07-31-1933 DOA: 05/06/2024 PCP: Tena Feeling, MD   Brief Narrative: [name] is a 89 y.o. female with a history of hypertension, hypothyroidism, atrial fibrillation/flutter status post AV nodal ablation and [employer] pacemaker, heart failure, PVCs, thoracic aortic aneurysm.  Patient presented secondary to chest pain and admitted for chest pain/ACS rule out.  Cardiology consulted on admission.   Assessment and Plan:  Chest pain Admitted for ACS rule out.  Neurology consulted.  EKG on admission significant for paced rhythm.  High-sensitivity troponin of 14 on admission with negative delta of 16.  Chest discomfort still present and more pronounced when moving (not just on exertion but at rest). Transthoracic Echocardiogram significant for an LVEF of 60-65% with no regional wall motion abnormalities; severe biatrial enlargement present. - Cardiology recommendations: Hold diuresis. Plan for cardiac catheterization on 5/28  Acute on chronic diastolic heart failure Patient with increased lower extremity edema in addition to pleural effusions noted on chest imaging.  And BNP of 439.  Patient with last LVEF of 60 to 65% from May 2023.  Patient started on Lasix  IV for diuresis, which is now discontinued. Weight of 165 lbs on admission, down to 159.9 lbs today. -Continue losartan , Toprol  XL, spironolactone   Primary hypertension Patient is on losartan  and metoprolol  as an outpatient which were resumed on admission. - Continue losartan  and metoprolol  succinate - Continue spironolactone  - May need antihypertensive regimen adjustment if persistent hypotension  Hypotension Occurred this morning while transferring. Possibly related to aggressive diuresis with 6 lb weight loss since admission. Lasix  held today. -Orthostatic vitals  Hypothyroidism - Continue Synthroid  125 mcg daily  History of atrial fibrillation/flutter Patient is status  post ablation and pacemaker placement.  Patient is on metoprolol  succinate and Eliquis  as an outpatient. - Continue metoprolol  succinate and Eliquis   Interstitial lung disease Presumed secondary to amiodarone  use, which is currently discontinued.  Patient is on Symbicort  as an outpatient. - Continue Breo Ellipta  (substituted for home Symbicort )   DVT prophylaxis: Eliquis  Code Status:   Code Status: Full Code Family Communication: None Disposition Plan: Discharge home pending continued cardiology recommendations   Consultants:  Cardiology  Procedures:  Transthoracic Echocardiogram  Antimicrobials: None    Subjective: No issues from overnight. Hypotension when attempting to transfer earlier this morning.  Objective: BP 103/60   Pulse 70   Temp 98.4 F (36.9 C) (Oral)   Resp 20   Ht 5' 7.5" (1.715 m)   Wt 72.1 kg   LMP  (LMP Unknown)   SpO2 92%   BMI 24.52 kg/m   Examination:  General exam: Appears calm and comfortable Respiratory system: Clear to auscultation. Respiratory effort normal. Cardiovascular system: S1 & S2 heard, RRR. No murmurs. Gastrointestinal system: Abdomen is nondistended, soft and nontender. Normal bowel sounds heard. Central nervous system: Alert and oriented. No focal neurological deficits. Psychiatry: Judgement and insight appear normal. Mood & affect appropriate.    Data Reviewed: I have personally reviewed following labs and imaging studies  CBC Lab Results  Component Value Date   WBC 7.6 05/07/2024   RBC 3.68 (L) 05/07/2024   HGB 11.2 (L) 05/07/2024   HCT 34.8 (L) 05/07/2024   MCV 94.6 05/07/2024   MCH 30.4 05/07/2024   PLT 251 05/07/2024   MCHC 32.2 05/07/2024   RDW 14.6 05/07/2024   LYMPHSABS 0.9 12/12/2023   MONOABS 0.6 12/12/2023   EOSABS 0.0 12/12/2023   BASOSABS 0.0 12/12/2023  Last metabolic panel Lab Results  Component Value Date   NA 138 05/07/2024   K 3.5 05/07/2024   CL 104 05/07/2024   CO2 24 05/07/2024    BUN 15 05/07/2024   CREATININE 0.86 05/07/2024   GLUCOSE 95 05/07/2024   GFRNONAA >60 05/07/2024   GFRAA 65 09/01/2017   CALCIUM 8.5 (L) 05/07/2024   PHOS 3.1 12/12/2023   PROT 6.2 (L) 05/07/2024   ALBUMIN 3.0 (L) 05/07/2024   LABGLOB 3.0 09/30/2021   AGRATIO 1.5 09/30/2021   BILITOT 0.6 05/07/2024   ALKPHOS 72 05/07/2024   AST 18 05/07/2024   ALT 10 05/07/2024   ANIONGAP 10 05/07/2024    GFR: Estimated Creatinine Clearance: 44 mL/min (by C-G formula based on SCr of 0.86 mg/dL).  Recent Results (from the past 240 hours)  Urine Culture (for pregnant, neutropenic or urologic patients or patients with an indwelling urinary catheter)     Status: Abnormal   Collection Time: 05/07/24  8:21 AM   Specimen: Urine, Clean Catch  Result Value Ref Range Status   Specimen Description URINE, CLEAN CATCH  Final   Special Requests NONE  Final   Culture (A)  Final    <10,000 COLONIES/mL INSIGNIFICANT GROWTH Performed at [facility], [address], [place], [place] [address]    Report Status 05/08/2024 FINAL  Final      Radiology Studies: ECHOCARDIOGRAM COMPLETE Result Date: 05/07/2024    ECHOCARDIOGRAM REPORT   Patient Name:   [name] Date of Exam: 05/07/2024 Medical Rec #:  [id number]      Height:       67.5 in Accession #:    [id number]     Weight:       163.1 lb Date of Birth:  1933/02/17      BSA:          1.865 m Patient Age:    89 years       BP:           132/81 mmHg Patient Gender: F              HR:           71 bpm. Exam Location:  Inpatient Procedure: 2D Echo, Cardiac Doppler, Color Doppler and Intracardiac            Opacification Agent (Both Spectral and Color Flow Doppler were            utilized during procedure). Indications:    R07.9* Chest pain, unspecified  History:        Patient has prior history of Echocardiogram examinations, most                 recent 05/05/2022. Pacemaker, Arrythmias:Atrial Fibrillation;                 Risk Factors:Hypertension.   Sonographer:    Sherline Distel Senior RDCS Referring Phys: Johnetta Nab  Sonographer Comments: Technically difficult due to poor echo windows. IMPRESSIONS  1. Left ventricular ejection fraction, by estimation, is 60 to 65%. The left ventricle has normal function. The left ventricle has no regional wall motion abnormalities. Left ventricular diastolic parameters are indeterminate.  2. Right ventricular systolic function is normal. The right ventricular size is normal.  3. Left atrial size was severely dilated.  4. Right atrial size was severely dilated.  5. The mitral valve is abnormal. Mild to moderate mitral valve regurgitation. No evidence of mitral stenosis.  6. The aortic valve is  tricuspid. There is mild calcification of the aortic valve. There is mild thickening of the aortic valve. Aortic valve regurgitation is trivial. Aortic valve sclerosis is present, with no evidence of aortic valve stenosis.  7. Aortic dilatation noted. There is moderate dilatation of the ascending aorta, measuring 42 mm.  8. The inferior vena cava is normal in size with greater than 50% respiratory variability, suggesting right atrial pressure of 3 mmHg. FINDINGS  Left Ventricle: Left ventricular ejection fraction, by estimation, is 60 to 65%. The left ventricle has normal function. The left ventricle has no regional wall motion abnormalities. Definity contrast agent was given IV to delineate the left ventricular  endocardial borders. Strain was performed and the global longitudinal strain is indeterminate. The left ventricular internal cavity size was normal in size. There is no left ventricular hypertrophy. Left ventricular diastolic parameters are indeterminate. Right Ventricle: The right ventricular size is normal. No increase in right ventricular wall thickness. Right ventricular systolic function is normal. Left Atrium: Left atrial size was severely dilated. Right Atrium: Right atrial size was severely dilated. Pericardium: There is  no evidence of pericardial effusion. Mitral Valve: The mitral valve is abnormal. There is mild thickening of the mitral valve leaflet(s). There is mild calcification of the mitral valve leaflet(s). Mild mitral annular calcification. Mild to moderate mitral valve regurgitation. No evidence of mitral valve stenosis. Tricuspid Valve: The tricuspid valve is normal in structure. Tricuspid valve regurgitation is mild . No evidence of tricuspid stenosis. Aortic Valve: The aortic valve is tricuspid. There is mild calcification of the aortic valve. There is mild thickening of the aortic valve. Aortic valve regurgitation is trivial. Aortic valve sclerosis is present, with no evidence of aortic valve stenosis. Pulmonic Valve: The pulmonic valve was normal in structure. Pulmonic valve regurgitation is trivial. No evidence of pulmonic stenosis. Aorta: Aortic dilatation noted. There is moderate dilatation of the ascending aorta, measuring 42 mm. Venous: The inferior vena cava is normal in size with greater than 50% respiratory variability, suggesting right atrial pressure of 3 mmHg. IAS/Shunts: No atrial level shunt detected by color flow Doppler. Additional Comments: 3D was performed not requiring image post processing on an independent workstation and was indeterminate.  LEFT VENTRICLE PLAX 2D LVOT diam:     2.20 cm   Diastology LV SV:         43        LV e' lateral:   3.83 cm/s LV SV Index:   23        LV E/e' lateral: 37.9 LVOT Area:     3.80 cm  RIGHT VENTRICLE RV S prime:     10.20 cm/s TAPSE (M-mode): 1.5 cm LEFT ATRIUM            Index        RIGHT ATRIUM           Index LA Vol (A2C): 124.0 ml 66.50 ml/m  RA Area:     29.20 cm LA Vol (A4C): 120.0 ml 64.35 ml/m  RA Volume:   90.50 ml  48.53 ml/m  AORTIC VALVE LVOT Vmax:   57.40 cm/s LVOT Vmean:  40.800 cm/s LVOT VTI:    0.113 m  AORTA Ao Root diam: 3.40 cm Ao Asc diam:  4.20 cm MITRAL VALVE MV Area (PHT): 5.70 cm     SHUNTS MV Decel Time: 133 msec     Systemic VTI:   0.11 m MV E velocity: 145.00 cm/s  Systemic Diam: 2.20 cm Janelle Mediate  MD Electronically signed by Janelle Mediate MD Signature Date/Time: 05/07/2024/1:20:00 PM    Final    VAS US  LOWER EXTREMITY VENOUS (DVT) (ONLY [facility] & [facility]) Result Date: 05/06/2024  Lower Venous DVT Study Patient Name:  SEYMONE FORLENZA  Date of Exam:   05/06/2024 Medical Rec #: [id number]       Accession #:    [id number] Date of Birth: [birth year]       Patient Gender: F Patient Age:   41 years Exam Location:  [facility] Procedure:      VAS US  LOWER EXTREMITY VENOUS (DVT) Referring Phys: JON KNAPP --------------------------------------------------------------------------------  Indications: Swelling, and Pain.  Comparison Study: 01/31/2024 Performing Technologist: Gelene Kelly RDCS  Examination Guidelines: A complete evaluation includes B-mode imaging, spectral Doppler, color Doppler, and power Doppler as needed of all accessible portions of each vessel. Bilateral testing is considered an integral part of a complete examination. Limited examinations for reoccurring indications may be performed as noted. The reflux portion of the exam is performed with the patient in reverse Trendelenburg.  +-----+---------------+---------+-----------+----------+--------------+ RIGHTCompressibilityPhasicitySpontaneityPropertiesThrombus Aging +-----+---------------+---------+-----------+----------+--------------+ CFV  Full           Yes      Yes                                 +-----+---------------+---------+-----------+----------+--------------+   +---------+---------------+---------+-----------+----------+--------------+ LEFT     CompressibilityPhasicitySpontaneityPropertiesThrombus Aging +---------+---------------+---------+-----------+----------+--------------+ CFV      Full           Yes      Yes                                 +---------+---------------+---------+-----------+----------+--------------+ SFJ      Full            Yes      Yes                                 +---------+---------------+---------+-----------+----------+--------------+ FV Prox  Full           Yes      Yes                                 +---------+---------------+---------+-----------+----------+--------------+ FV Mid   Full           Yes      Yes                                 +---------+---------------+---------+-----------+----------+--------------+ FV DistalFull           Yes      Yes                                 +---------+---------------+---------+-----------+----------+--------------+ PFV      Full                                                        +---------+---------------+---------+-----------+----------+--------------+ POP      Full  Yes      Yes                                 +---------+---------------+---------+-----------+----------+--------------+ PTV      Full           Yes      Yes                                 +---------+---------------+---------+-----------+----------+--------------+ PERO     Full           Yes      Yes                                 +---------+---------------+---------+-----------+----------+--------------+ Gastroc  Full                                                        +---------+---------------+---------+-----------+----------+--------------+ GSV      Full           Yes      Yes                                 +---------+---------------+---------+-----------+----------+--------------+   Findings reported to Dr. Monnie Anthony at 3:30.  Summary: RIGHT: - No evidence of common femoral vein obstruction.   LEFT: - No evidence of deep vein thrombosis in the lower extremity. No indirect evidence of obstruction proximal to the inguinal ligament.  - No cystic structure found in the popliteal fossa.  *See table(s) above for measurements and observations. Electronically signed by Irvin Mantel on 05/06/2024 at 7:42:32 PM.    Final    CT Angio Chest  Aorta W and/or Wo Contrast Result Date: 05/06/2024 CLINICAL DATA:  Suspect acute aortic syndrome. EXAM: CT ANGIOGRAPHY CHEST WITH CONTRAST TECHNIQUE: Multidetector CT imaging of the chest was performed using the standard protocol during bolus administration of intravenous contrast. Multiplanar CT image reconstructions and MIPs were obtained to evaluate the vascular anatomy. RADIATION DOSE REDUCTION: This exam was performed according to the departmental dose-optimization program which includes automated exposure control, adjustment of the mA and/or kV according to patient size and/or use of iterative reconstruction technique. CONTRAST:  75mL OMNIPAQUE IOHEXOL 350 MG/ML SOLN COMPARISON:  12/09/2023 FINDINGS: Cardiovascular: Dual lead left-sided cardiac pacemaker. Persistent cardiomegaly with evidence of biatrial enlargement. Ascending thoracic aorta measures approximately 4.2 cm in greatest diameter without significant change. No evidence of dissection. Mild calcified plaque over the descending thoracic aorta. Normal 3 vessel takeoff of the aortic arch. Poor opacification over the pulmonary arterial system. Remaining vascular structures are unremarkable. Mediastinum/Nodes: No mediastinal or hilar adenopathy. Remaining mediastinal structures are unremarkable. Lungs/Pleura: Lungs are adequately inflated demonstrate small bilateral pleural effusions right greater than left with minimal patchy posterior right base opacification likely atelectasis although could not exclude early infection. Mild linear atelectasis over the lingula. Mild scarring over the posterior apices. Airways are unremarkable. Upper Abdomen: Minimal calcified plaque over the abdominal aorta. No acute findings in the upper abdomen. Musculoskeletal: No acute findings. Review of the MIP images confirms the above findings. IMPRESSION: 1. Small bilateral pleural effusions right  greater than left with minimal patchy posterior right base opacification likely  atelectasis, although could not exclude early infection. 2. Stable cardiomegaly with evidence of biatrial enlargement. 3. Stable ascending thoracic aorta measuring approximately 4.2 cm in greatest diameter. No evidence of aortic dissection. Recommend annual imaging followup by CTA or MRA. This recommendation follows 2010 ACCF/AHA/AATS/ACR/ASA/SCA/SCAI/SIR/STS/SVM Guidelines for the Diagnosis and Management of Patients with Thoracic Aortic Disease. Circulation. 2010; 121: Z308-M578. Aortic aneurysm NOS (ICD10-I71.9). 4. Aortic atherosclerosis. Aortic Atherosclerosis (ICD10-I70.0). Electronically Signed   By: Roda Cirri M.D.   On: 05/06/2024 15:49   DG Chest 2 View Result Date: 05/06/2024 CLINICAL DATA:  Chest pain EXAM: CHEST - 2 VIEW COMPARISON:  X-ray 03/14/2024 and older FINDINGS: Underinflation. Enlarged cardiopericardial silhouette with calcified tortuous aorta. Increasing left retrocardiac opacity and effusion. Apical pleural thickening. Left upper chest pacemaker. No edema. Interstitial changes are again noted. Overlapping cardiac leads. Films are under penetrated. Likely tiny right effusion as well. Surgical changes along the left humeral head. IMPRESSION: Increasing left lung base opacity and effusion. Tiny right effusion. Enlarged cardiopericardial silhouette vascular congestion. Pacemaker. Recommend follow-up Electronically Signed   By: Adrianna Horde M.D.   On: 05/06/2024 12:36      LOS: 1 day    Aneita Keens, MD [facility] 05/08/2024, 11:52 AM   If 7PM-7AM, please contact night-coverage [email or web address]

## 2024-05-08 NOTE — Progress Notes (Signed)
 BP dropped to 50s/40s while in the chair this am. Pt was symptomatic-feeling dizzy. We got Pt back in bed and placed Pt in trendelenburg BP back to 120s/70s and feeling better. I did orthos on her and she was positive. Pt is still having cp whenever she moves. Internal med is aware and has seen the patient. Cardiology to round on her soon.

## 2024-05-08 NOTE — Progress Notes (Addendum)
 Patient Name: [name] Date of Encounter: 05/08/2024 [facility] Cardiologist: None   Interval Summary  .    Patient seen at bedside, reported improvement in her chest discomfort but not completley resolved. Became hypotensive of 80/62 briefly and  improved to 121/72.  Vital Signs .    Vitals:   05/08/24 0855 05/08/24 0900 05/08/24 0952 05/08/24 1022  BP: 126/73 121/72 (!) 89/64 103/60  Pulse: 70 70    Resp:      Temp:      TempSrc:      SpO2: 93% 92%    Weight:      Height:        Intake/Output Summary (Last 24 hours) at 05/08/2024 1046 Last data filed at 05/08/2024 0455 Gross per 24 hour  Intake 207.5 ml  Output 1850 ml  Net -1642.5 ml      05/08/2024    4:47 AM 05/07/2024    4:43 AM 05/06/2024   11:53 AM  Last 3 Weights  Weight (lbs) 158 lb 14.4 oz 163 lb 2.3 oz 165 lb  Weight (kg) 72.077 kg 74 kg 74.844 kg      Telemetry/ECG    Paced rhythm,HR of 70 - Personally Reviewed  Physical Exam .   GEN: No acute distress.   Neck: No JVD Cardiac: RRR, no murmurs, rubs, or gallops.  Respiratory: Rales appreciated on inhalation  GI: Soft, nontender, non-distended  MS: Warm and dry  Assessment & Plan .   Patient is a 89 year old female with a history of persistent A-fib status post AV node ablation with dual-chamber pacemaker, chronic diastolic heart failure, thoracic aortic aneurysm, hypertension, and hypothyroidism who initially presented due to concerns for chest discomfort, admitted for ACS rule out and found to have acute on chronic diastolic heart failure.  #Acute on chronic diastolic heart failure - Echo done on 5/26 showed Left ventricular ejection fraction, by estimation, is 60 to 65% unchanged from 2023. The left ventricle has normal function. The left ventricle has no regional  wall motion abnormalities. Left ventricular diastolic parameters are indeterminate.  - Euvolemic - Hold Lasix     #Chest discomfort/CAD? -EKG reviewed, low concerns  for ACS at this time.  Patient reports improved chest discomfort, this is likely due to volume overload in the setting of heart failure, most likely diastolic.  Echo reviewed and I am not worried about systolic dysfunctioning.  - Patient continue to have chest heaviness despite adequate diuresing and I worry that her volume overload may not be the etiology for her chest discomfort  - Patient would benefit from heart cath at this time - Hold Eliquis   - Hold lasix  - Left heart Cath tomorrow     #Persistent A-fib/flutter #AV node ablation status post dual chamber pacemaker - Hold Eliquis    #Hypertension - On losartan , spironolactone and metoprolol  succinate. - Continue to monitor vital signs and hold antihypertensive if appropriate   #Aortic aneurysm - Stable and no active concerns at this time.  For questions or updates, please contact [facility] Please consult [email or web address] for contact info under     Signed, Fay Hoop, MD    I have personally seen and examined this patient. I agree with the assessment and plan as outlined above.  89 yo female admitted with chest pain. Troponin negative. EKG paced. Echo with normal LV function.  She was volume overloaded on admission.  She has diuresed well with IV Lasix . Her LE edema has resolved and her dyspnea is  improved.  She continues to have chest pain. This was her presenting complaint.  Labs reviewed by me. Tele with sinus My exam: NAD, RRR Lungs, clear  Ext: No LE edema Plan:  Acute on chronic diastolic CHF: She has diuresed well. Lasix  will be held today as he appears euvolemic. Anticipate Lasix  40 mg po daily starting tomorrow post cath Chest pain: No evidence of ACS but she continues to have a nagging central chest pressure. Given advanced age, I am not sure a coronary CTA would be the best test. A negative myoview would not convince me that her pain is not cardiac. While her pain has some atypical features, I think  cardiac cath is indicated to exclude CAD.  I have reviewed the risks, indications, and alternatives to cardiac catheterization, possible angioplasty, and stenting with the patient. Risks include but are not limited to bleeding, infection, vascular injury, stroke, myocardial infection, arrhythmia, kidney injury, radiation-related injury in the case of prolonged fluoroscopy use, emergency cardiac surgery, and death. The patient understands the risks of serious complication is 1-2 in 1000 with diagnostic cardiac cath and 1-2% or less with angioplasty/stenting. -Plan cardiac cath with possible PCI tomorrow with Dr. [place]. NPO at midnight. Hold Eliquis  (last dose on 05/07/24).   [name], MD,FACC 05/08/2024 10:52 AM

## 2024-05-08 NOTE — TOC Initial Note (Signed)
 Transition of Care [facility]) - Initial/Assessment Note    Patient Details  Name: [name] MRN: [id number] Date of Birth: December 30, 1932  Transition of Care [facility]) CM/SW Contact:    Cosimo Diones, RN Phone Number: 05/08/2024, 10:32 AM  Clinical Narrative: Patient presented for chest pain. PTA patient was from home with spouse. Patient states she has a cane just to use outside of the home. Patient has PCP and spouse drives to appointments. Patient will benefit from PT/OT consult-discussed in progression.  Spouse to provide transportation home once stable. Case Manager will continue to follow for additional needs.                 Expected Discharge Plan: Home w Home Health Services Barriers to Discharge: Continued Medical Work up   Patient Goals and CMS Choice Patient states their goals for this hospitalization and ongoing recovery are:: plan to return home once stable          Expected Discharge Plan and Services In-house Referral: NA Discharge Planning Services: CM Consult Post Acute Care Choice: Home Health Living arrangements for the past 2 months: Single Family Home                   DME Agency: NA                  Prior Living Arrangements/Services Living arrangements for the past 2 months: Single Family Home Lives with:: Spouse Patient language and need for interpreter reviewed:: Yes Do you feel safe going back to the place where you live?: Yes      Need for Family Participation in Patient Care: No (Comment) Care giver support system in place?: Yes (comment)   Criminal Activity/Legal Involvement Pertinent to Current Situation/Hospitalization: No - Comment as needed  Activities of Daily Living   ADL Screening (condition at time of admission) Independently performs ADLs?: Yes (appropriate for developmental age) Is the patient deaf or have difficulty hearing?: No Does the patient have difficulty seeing, even when wearing glasses/contacts?: No Does  the patient have difficulty concentrating, remembering, or making decisions?: No  Permission Sought/Granted Permission sought to share information with : Family Supports, Case Manager                Emotional Assessment Appearance:: Appears stated age Attitude/Demeanor/Rapport: Engaged Affect (typically observed): Appropriate Orientation: : Oriented to Self, Oriented to Place, Oriented to  Time, Oriented to Situation Alcohol  / Substance Use: Not Applicable Psych Involvement: No (comment)  Admission diagnosis:  Pleural effusion [J90] Chest pain, rule out acute myocardial infarction [R07.9] Chest pain, unspecified type [R07.9] Patient Active Problem List   Diagnosis Date Noted   Chest pain, rule out acute myocardial infarction 05/06/2024   Acute on chronic diastolic CHF (congestive heart failure) (HCC) 05/06/2024   Chronic respiratory failure with hypoxia (HCC) 05/01/2024   Mixed stress and urge urinary incontinence 09/29/2023   Nocturia 09/29/2023   Abnormal finding on urinalysis 09/29/2023   Vaginal atrophy 09/29/2023   Chronic diastolic heart failure (HCC) 03/27/2021   PVC's (premature ventricular contractions) 09/23/2020   Pacemaker 01/10/2020   Pain in left knee 09/27/2018   ILD (interstitial lung disease) (HCC) 02/21/2017   Dyspnea and respiratory abnormality 02/11/2017   Abnormality of gait and mobility 01/19/2017   Hypothyroidism 01/19/2017   Other specified disorders of bone density and structure, unspecified site 01/19/2017   Genetic testing 03/13/2015   Paresthesia of skin 03/03/2015   Breast cancer of lower-outer quadrant of left female breast (  HCC) 02/06/2015   Hematuria    PAF (paroxysmal atrial fibrillation) (HCC)    Chronic venous insufficiency    SVT (supraventricular tachycardia) (HCC) 06/12/2010   Essential hypertension 04/22/2010   MITRAL VALVE PROLAPSE 04/22/2010   PCP:  Tena Feeling, MD Pharmacy:   [facility] [place], [place] - [address]  [address] [place] [address] Phone: [phone] Fax: [phone]  [facility] [address] [place] [address] Phone: [phone] Fax: [phone]     Social Drivers of Health (SDOH) Social History: SDOH Screenings   Food Insecurity: No Food Insecurity (05/06/2024)  Housing: Low Risk  (05/06/2024)  Transportation Needs: No Transportation Needs (05/06/2024)  Utilities: Not At Risk (05/06/2024)  Social Connections: Patient Declined (05/06/2024)  Tobacco Use: Low Risk  (05/06/2024)   SDOH Interventions:     Readmission Risk Interventions     No data to display

## 2024-05-08 NOTE — H&P (View-Only) (Signed)
 Patient Name: [name] Date of Encounter: 05/08/2024 [facility] Cardiologist: None   Interval Summary  .    Patient seen at bedside, reported improvement in her chest discomfort but not completley resolved. Became hypotensive of 80/62 briefly and  improved to 121/72.  Vital Signs .    Vitals:   05/08/24 0855 05/08/24 0900 05/08/24 0952 05/08/24 1022  BP: 126/73 121/72 (!) 89/64 103/60  Pulse: 70 70    Resp:      Temp:      TempSrc:      SpO2: 93% 92%    Weight:      Height:        Intake/Output Summary (Last 24 hours) at 05/08/2024 1046 Last data filed at 05/08/2024 0455 Gross per 24 hour  Intake 207.5 ml  Output 1850 ml  Net -1642.5 ml      05/08/2024    4:47 AM 05/07/2024    4:43 AM 05/06/2024   11:53 AM  Last 3 Weights  Weight (lbs) 158 lb 14.4 oz 163 lb 2.3 oz 165 lb  Weight (kg) 72.077 kg 74 kg 74.844 kg      Telemetry/ECG    Paced rhythm,HR of 70 - Personally Reviewed  Physical Exam .   GEN: No acute distress.   Neck: No JVD Cardiac: RRR, no murmurs, rubs, or gallops.  Respiratory: Rales appreciated on inhalation  GI: Soft, nontender, non-distended  MS: Warm and dry  Assessment & Plan .   Patient is a 89 year old female with a history of persistent A-fib status post AV node ablation with dual-chamber pacemaker, chronic diastolic heart failure, thoracic aortic aneurysm, hypertension, and hypothyroidism who initially presented due to concerns for chest discomfort, admitted for ACS rule out and found to have acute on chronic diastolic heart failure.  #Acute on chronic diastolic heart failure - Echo done on 5/26 showed Left ventricular ejection fraction, by estimation, is 60 to 65% unchanged from 2023. The left ventricle has normal function. The left ventricle has no regional  wall motion abnormalities. Left ventricular diastolic parameters are indeterminate.  - Euvolemic - Hold Lasix     #Chest discomfort/CAD? -EKG reviewed, low concerns  for ACS at this time.  Patient reports improved chest discomfort, this is likely due to volume overload in the setting of heart failure, most likely diastolic.  Echo reviewed and I am not worried about systolic dysfunctioning.  - Patient continue to have chest heaviness despite adequate diuresing and I worry that her volume overload may not be the etiology for her chest discomfort  - Patient would benefit from heart cath at this time - Hold Eliquis   - Hold lasix  - Left heart Cath tomorrow     #Persistent A-fib/flutter #AV node ablation status post dual chamber pacemaker - Hold Eliquis    #Hypertension - On losartan , spironolactone and metoprolol  succinate. - Continue to monitor vital signs and hold antihypertensive if appropriate   #Aortic aneurysm - Stable and no active concerns at this time.  For questions or updates, please contact [facility] Please consult [email or web address] for contact info under     Signed, Tracy Hoop, MD    I have personally seen and examined this patient. I agree with the assessment and plan as outlined above.  89 yo female admitted with chest pain. Troponin negative. EKG paced. Echo with normal LV function.  She was volume overloaded on admission.  She has diuresed well with IV Lasix . Her LE edema has resolved and her dyspnea is  improved.  She continues to have chest pain. This was her presenting complaint.  Labs reviewed by me. Tele with sinus My exam: NAD, RRR Lungs, clear  Ext: No LE edema Plan:  Acute on chronic diastolic CHF: She has diuresed well. Lasix  will be held today as he appears euvolemic. Anticipate Lasix  40 mg po daily starting tomorrow post cath Chest pain: No evidence of ACS but she continues to have a nagging central chest pressure. Given advanced age, I am not sure a coronary CTA would be the best test. A negative myoview would not convince me that her pain is not cardiac. While her pain has some atypical features, I think  cardiac cath is indicated to exclude CAD.  I have reviewed the risks, indications, and alternatives to cardiac catheterization, possible angioplasty, and stenting with the patient. Risks include but are not limited to bleeding, infection, vascular injury, stroke, myocardial infection, arrhythmia, kidney injury, radiation-related injury in the case of prolonged fluoroscopy use, emergency cardiac surgery, and death. The patient understands the risks of serious complication is 1-2 in 1000 with diagnostic cardiac cath and 1-2% or less with angioplasty/stenting. -Plan cardiac cath with possible PCI tomorrow with Dr. [place]. NPO at midnight. Hold Eliquis  (last dose on 05/07/24).   Tracy Batman, MD,FACC 05/08/2024 10:52 AM

## 2024-05-08 NOTE — Progress Notes (Signed)
 Mobility Specialist Progress Note;   05/08/24 1016  Mobility  Activity Refused mobility  Mobility Specialist Start Time (ACUTE ONLY) 1016   RN deferred mobility at this time d/t soft BP and orthostatic +. Will f/u as schedule permits.   Janit Meline Mobility Specialist Please contact via SecureChat or [employer] [phone]

## 2024-05-09 ENCOUNTER — Encounter (HOSPITAL_COMMUNITY)
Admission: EM | Disposition: A | Discharge status: Home / Self Care | Payer: Self-pay | Source: Home / Self Care | Attending: Family Medicine

## 2024-05-09 ENCOUNTER — Encounter (HOSPITAL_COMMUNITY): Payer: Self-pay | Admitting: Cardiology

## 2024-05-09 DIAGNOSIS — I10 Essential (primary) hypertension: Secondary | ICD-10-CM | POA: Diagnosis not present

## 2024-05-09 DIAGNOSIS — I480 Paroxysmal atrial fibrillation: Secondary | ICD-10-CM | POA: Diagnosis not present

## 2024-05-09 DIAGNOSIS — R072 Precordial pain: Secondary | ICD-10-CM

## 2024-05-09 DIAGNOSIS — N3000 Acute cystitis without hematuria: Secondary | ICD-10-CM

## 2024-05-09 DIAGNOSIS — I5033 Acute on chronic diastolic (congestive) heart failure: Secondary | ICD-10-CM | POA: Diagnosis not present

## 2024-05-09 DIAGNOSIS — M940 Chondrocostal junction syndrome [Tietze]: Secondary | ICD-10-CM

## 2024-05-09 DIAGNOSIS — J849 Interstitial pulmonary disease, unspecified: Secondary | ICD-10-CM

## 2024-05-09 DIAGNOSIS — N390 Urinary tract infection, site not specified: Secondary | ICD-10-CM

## 2024-05-09 HISTORY — PX: LEFT HEART CATH AND CORONARY ANGIOGRAPHY: CATH118249

## 2024-05-09 LAB — CBC
HCT: 37.3 % (ref 36.0–46.0)
Hemoglobin: 12.1 g/dL (ref 12.0–15.0)
MCH: 30.6 pg (ref 26.0–34.0)
MCHC: 32.4 g/dL (ref 30.0–36.0)
MCV: 94.4 fL (ref 80.0–100.0)
Platelets: 239 10*3/uL (ref 150–400)
RBC: 3.95 MIL/uL (ref 3.87–5.11)
RDW: 14.5 % (ref 11.5–15.5)
WBC: 6.7 10*3/uL (ref 4.0–10.5)
nRBC: 0 % (ref 0.0–0.2)

## 2024-05-09 LAB — BASIC METABOLIC PANEL WITH GFR
Anion gap: 12 (ref 5–15)
BUN: 18 mg/dL (ref 8–23)
CO2: 23 mmol/L (ref 22–32)
Calcium: 8.8 mg/dL — ABNORMAL LOW (ref 8.9–10.3)
Chloride: 103 mmol/L (ref 98–111)
Creatinine, Ser: 0.89 mg/dL (ref 0.44–1.00)
GFR, Estimated: >60 mL/min (ref >60)
Glucose, Bld: 98 mg/dL (ref 70–99)
Potassium: 3.8 mmol/L (ref 3.5–5.1)
Sodium: 138 mmol/L (ref 135–145)

## 2024-05-09 SURGERY — LEFT HEART CATH AND CORONARY ANGIOGRAPHY
Anesthesia: LOCAL

## 2024-05-09 MED ORDER — LIDOCAINE HCL (PF) 1 % IJ SOLN
INTRAMUSCULAR | Status: DC | PRN
  Administered 2024-05-09: 2 mL

## 2024-05-09 MED ORDER — POTASSIUM CHLORIDE CRYS ER 20 MEQ PO TBCR
40.0000 meq | EXTENDED_RELEASE_TABLET | Freq: Once | ORAL | Status: AC
  Administered 2024-05-09: 40 meq via ORAL
  Filled 2024-05-09: qty 2

## 2024-05-09 MED ORDER — FENTANYL CITRATE (PF) 100 MCG/2ML IJ SOLN
INTRAMUSCULAR | Status: AC
  Filled 2024-05-09: qty 2

## 2024-05-09 MED ORDER — SODIUM CHLORIDE 0.9% FLUSH: 3.0000 mL | INTRAVENOUS | Status: DC | PRN

## 2024-05-09 MED ORDER — MIDAZOLAM HCL 2 MG/2ML IJ SOLN
INTRAMUSCULAR | Status: AC
  Filled 2024-05-09: qty 2

## 2024-05-09 MED ORDER — SODIUM CHLORIDE 0.9% FLUSH
3.0000 mL | Freq: Two times a day (BID) | INTRAVENOUS | Status: DC
  Administered 2024-05-09 – 2024-05-10 (×3): 3 mL via INTRAVENOUS

## 2024-05-09 MED ORDER — VERAPAMIL HCL 2.5 MG/ML IV SOLN
INTRAVENOUS | Status: DC | PRN
  Administered 2024-05-09: 10 mL via INTRA_ARTERIAL

## 2024-05-09 MED ORDER — IBUPROFEN 200 MG PO TABS
400.0000 mg | ORAL_TABLET | Freq: Four times a day (QID) | ORAL | Status: DC
  Administered 2024-05-09 – 2024-05-10 (×3): 400 mg via ORAL
  Filled 2024-05-09 (×4): qty 2

## 2024-05-09 MED ORDER — MIDAZOLAM HCL 2 MG/2ML IJ SOLN
INTRAMUSCULAR | Status: DC | PRN
  Administered 2024-05-09: 1 mg via INTRAVENOUS

## 2024-05-09 MED ORDER — VERAPAMIL HCL 2.5 MG/ML IV SOLN
INTRAVENOUS | Status: AC
Start: 2024-05-09 — End: ?
  Filled 2024-05-09: qty 2

## 2024-05-09 MED ORDER — FENTANYL CITRATE (PF) 100 MCG/2ML IJ SOLN
INTRAMUSCULAR | Status: DC | PRN
  Administered 2024-05-09: 25 ug via INTRAVENOUS

## 2024-05-09 MED ORDER — HEPARIN SODIUM (PORCINE) 1000 UNIT/ML IJ SOLN
INTRAMUSCULAR | Status: DC | PRN
  Administered 2024-05-09: 3500 [IU] via INTRAVENOUS

## 2024-05-09 MED ORDER — HEPARIN SODIUM (PORCINE) 1000 UNIT/ML IJ SOLN
INTRAMUSCULAR | Status: AC
Start: 2024-05-09 — End: ?
  Filled 2024-05-09: qty 10

## 2024-05-09 MED ORDER — SODIUM CHLORIDE 0.9 % IV SOLN: 250.0000 mL | INTRAVENOUS | Status: AC | PRN

## 2024-05-09 MED ORDER — HEPARIN (PORCINE) IN NACL 1000-0.9 UT/500ML-% IV SOLN
INTRAVENOUS | Status: DC | PRN
  Administered 2024-05-09 (×2): 500 mL

## 2024-05-09 MED ORDER — DICLOFENAC SODIUM 1 % EX GEL
2.0000 g | Freq: Four times a day (QID) | CUTANEOUS | Status: DC
  Administered 2024-05-09 – 2024-05-10 (×3): 2 g via TOPICAL
  Filled 2024-05-09: qty 100

## 2024-05-09 MED ORDER — LIDOCAINE HCL (PF) 1 % IJ SOLN
INTRAMUSCULAR | Status: AC
  Filled 2024-05-09: qty 30

## 2024-05-09 MED ORDER — IOHEXOL 350 MG/ML SOLN
INTRAVENOUS | Status: DC | PRN
  Administered 2024-05-09: 30 mL

## 2024-05-09 SURGICAL SUPPLY — 10 items
CATH 5FR JL3.5 JR4 ANG PIG MP (CATHETERS) IMPLANT
DEVICE RAD TR BAND REGULAR (VASCULAR PRODUCTS) IMPLANT
GLIDESHEATH SLEND SS 6F .021 (SHEATH) IMPLANT
GUIDEWIRE INQWIRE 1.5J.035X260 (WIRE) IMPLANT
KIT SYRINGE INJ CVI SPIKEX1 (MISCELLANEOUS) IMPLANT
PACK CARDIAC CATHETERIZATION (CUSTOM PROCEDURE TRAY) ×1 IMPLANT
SET ATX-X65L (MISCELLANEOUS) IMPLANT
SHEATH GLIDE SLENDER 4/5FR (SHEATH) IMPLANT
SHEATH PROBE COVER 6X72 (BAG) IMPLANT
WIRE HI TORQ VERSACORE-J 145CM (WIRE) IMPLANT

## 2024-05-09 NOTE — Assessment & Plan Note (Addendum)
 Echocardiogram with preserved LV systolic function with EF 60 to 65%, no wall motion abnormalities, RV systolic function preserved, LA and RA with severe dilatation, mild to moderate mitral valve regurgitation.   Urine output is 1,240 ml Systolic blood pressure 129 mmHg range.   Plan to continue heart failure management with losartan , metoprolol  and spironolactone No SGLT 2 inh due to urinary tract infection.  Add 40 meq Kcl today to avoid hypokalemia.  Resume furosemide  in am.

## 2024-05-09 NOTE — Plan of Care (Signed)

## 2024-05-09 NOTE — Progress Notes (Addendum)
 Rounding Note    Patient Name: [name] Date of Encounter: 05/09/2024  [facility] Cardiologist: Carolynne Citron  Subjective   Chest pressure  Inpatient Medications    Scheduled Meds:  fluticasone furoate-vilanterol  1 puff Inhalation Daily   levothyroxine   125 mcg Oral QAC breakfast   losartan   50 mg Oral Daily   metoprolol  succinate  25 mg Oral Daily   mirabegron  ER  25 mg Oral Daily   pentoxifylline  400 mg Oral Daily   sodium chloride  flush  3 mL Intravenous Q12H   spironolactone  25 mg Oral Daily   Continuous Infusions:  sodium chloride      cefTRIAXone (ROCEPHIN)  IV 1 g (05/08/24 1131)   PRN Meds: sodium chloride , acetaminophen , ondansetron  (ZOFRAN ) IV, sodium chloride  flush   Vital Signs    Vitals:   05/09/24 0752 05/09/24 0757 05/09/24 0802 05/09/24 0807  BP: 124/82 104/65 96/64 109/73  Pulse: 70 70 70 70  Resp: 20 16 20 19   Temp:      TempSrc:      SpO2: 91% (!) 89% 92% 92%  Weight:      Height:        Intake/Output Summary (Last 24 hours) at 05/09/2024 0829 Last data filed at 05/09/2024 0347 Gross per 24 hour  Intake --  Output 1240 ml  Net -1240 ml      05/09/2024    3:25 AM 05/08/2024    4:47 AM 05/07/2024    4:43 AM  Last 3 Weights  Weight (lbs) 159 lb 11.2 oz 158 lb 14.4 oz 163 lb 2.3 oz  Weight (kg) 72.439 kg 72.077 kg 74 kg      Telemetry    Sinus - Personally Reviewed  ECG    No am ekg - Personally Reviewed  Physical Exam   GEN: No acute distress.   Neck: No JVD Cardiac: RRR, no murmurs, rubs, or gallops.  Respiratory: Clear to auscultation bilaterally. GI: Soft, nontender, non-distended  MS: No edema; No deformity. Neuro:  Nonfocal  Psych: Normal affect   Labs    High Sensitivity Troponin:   Recent Labs  Lab 05/06/24 1155 05/06/24 1625  TROPONINIHS 14 16     Chemistry Recent Labs  Lab 05/06/24 1625 05/07/24 0418 05/08/24 1045 05/09/24 0339  NA  --  138 139 138  K  --  3.5 3.5 3.8  CL  --  104  103 103  CO2  --  24 27 23   GLUCOSE  --  95 151* 98  BUN  --  15 18 18   CREATININE  --  0.86 1.19* 0.89  CALCIUM  --  8.5* 8.7* 8.8*  MG 2.0  --  2.0  --   PROT  --  6.2*  --   --   ALBUMIN  --  3.0*  --   --   AST  --  18  --   --   ALT  --  10  --   --   ALKPHOS  --  72  --   --   BILITOT  --  0.6  --   --   GFRNONAA  --  >60 44* >60  ANIONGAP  --  10 9 12     Lipids  Recent Labs  Lab 05/07/24 0418  CHOL 157  TRIG 85  HDL 59  LDLCALC 81  CHOLHDL 2.7    Hematology Recent Labs  Lab 05/06/24 1155 05/07/24 0418 05/09/24 0339  WBC 8.6  7.6 6.7  RBC 3.61* 3.68* 3.95  HGB 11.2* 11.2* 12.1  HCT 35.2* 34.8* 37.3  MCV 97.5 94.6 94.4  MCH 31.0 30.4 30.6  MCHC 31.8 32.2 32.4  RDW 14.6 14.6 14.5  PLT 233 251 239   Thyroid  No results for input(s): "TSH", "FREET4" in the last 168 hours.  BNP Recent Labs  Lab 05/06/24 1625  BNP 439.3*    DDimer No results for input(s): "DDIMER" in the last 168 hours.   Radiology    CARDIAC CATHETERIZATION Result Date: 05/09/2024   LV end diastolic pressure is normal. Moderate coronary ectasia, no obstructive CAD Low LVEDP 5 mm Hg Plan: consider other causes of chest pain.   ECHOCARDIOGRAM COMPLETE Result Date: 05/07/2024    ECHOCARDIOGRAM REPORT   Patient Name:   [name] Date of Exam: 05/07/2024 Medical Rec #:  [id number]      Height:       67.5 in Accession #:    [id number]     Weight:       163.1 lb Date of Birth:  November 18, 1933      BSA:          1.865 m Patient Age:    89 years       BP:           132/81 mmHg Patient Gender: F              HR:           71 bpm. Exam Location:  Inpatient Procedure: 2D Echo, Cardiac Doppler, Color Doppler and Intracardiac            Opacification Agent (Both Spectral and Color Flow Doppler were            utilized during procedure). Indications:    R07.9* Chest pain, unspecified  History:        Patient has prior history of Echocardiogram examinations, most                 recent 05/05/2022. Pacemaker,  Arrythmias:Atrial Fibrillation;                 Risk Factors:Hypertension.  Sonographer:    Sherline Distel Senior RDCS Referring Phys: Johnetta Nab  Sonographer Comments: Technically difficult due to poor echo windows. IMPRESSIONS  1. Left ventricular ejection fraction, by estimation, is 60 to 65%. The left ventricle has normal function. The left ventricle has no regional wall motion abnormalities. Left ventricular diastolic parameters are indeterminate.  2. Right ventricular systolic function is normal. The right ventricular size is normal.  3. Left atrial size was severely dilated.  4. Right atrial size was severely dilated.  5. The mitral valve is abnormal. Mild to moderate mitral valve regurgitation. No evidence of mitral stenosis.  6. The aortic valve is tricuspid. There is mild calcification of the aortic valve. There is mild thickening of the aortic valve. Aortic valve regurgitation is trivial. Aortic valve sclerosis is present, with no evidence of aortic valve stenosis.  7. Aortic dilatation noted. There is moderate dilatation of the ascending aorta, measuring 42 mm.  8. The inferior vena cava is normal in size with greater than 50% respiratory variability, suggesting right atrial pressure of 3 mmHg. FINDINGS  Left Ventricle: Left ventricular ejection fraction, by estimation, is 60 to 65%. The left ventricle has normal function. The left ventricle has no regional wall motion abnormalities. Definity contrast agent was given IV to delineate the left ventricular  endocardial borders. Strain was performed  and the global longitudinal strain is indeterminate. The left ventricular internal cavity size was normal in size. There is no left ventricular hypertrophy. Left ventricular diastolic parameters are indeterminate. Right Ventricle: The right ventricular size is normal. No increase in right ventricular wall thickness. Right ventricular systolic function is normal. Left Atrium: Left atrial size was severely dilated.  Right Atrium: Right atrial size was severely dilated. Pericardium: There is no evidence of pericardial effusion. Mitral Valve: The mitral valve is abnormal. There is mild thickening of the mitral valve leaflet(s). There is mild calcification of the mitral valve leaflet(s). Mild mitral annular calcification. Mild to moderate mitral valve regurgitation. No evidence of mitral valve stenosis. Tricuspid Valve: The tricuspid valve is normal in structure. Tricuspid valve regurgitation is mild . No evidence of tricuspid stenosis. Aortic Valve: The aortic valve is tricuspid. There is mild calcification of the aortic valve. There is mild thickening of the aortic valve. Aortic valve regurgitation is trivial. Aortic valve sclerosis is present, with no evidence of aortic valve stenosis. Pulmonic Valve: The pulmonic valve was normal in structure. Pulmonic valve regurgitation is trivial. No evidence of pulmonic stenosis. Aorta: Aortic dilatation noted. There is moderate dilatation of the ascending aorta, measuring 42 mm. Venous: The inferior vena cava is normal in size with greater than 50% respiratory variability, suggesting right atrial pressure of 3 mmHg. IAS/Shunts: No atrial level shunt detected by color flow Doppler. Additional Comments: 3D was performed not requiring image post processing on an independent workstation and was indeterminate.  LEFT VENTRICLE PLAX 2D LVOT diam:     2.20 cm   Diastology LV SV:         43        LV e' lateral:   3.83 cm/s LV SV Index:   23        LV E/e' lateral: 37.9 LVOT Area:     3.80 cm  RIGHT VENTRICLE RV S prime:     10.20 cm/s TAPSE (M-mode): 1.5 cm LEFT ATRIUM            Index        RIGHT ATRIUM           Index LA Vol (A2C): 124.0 ml 66.50 ml/m  RA Area:     29.20 cm LA Vol (A4C): 120.0 ml 64.35 ml/m  RA Volume:   90.50 ml  48.53 ml/m  AORTIC VALVE LVOT Vmax:   57.40 cm/s LVOT Vmean:  40.800 cm/s LVOT VTI:    0.113 m  AORTA Ao Root diam: 3.40 cm Ao Asc diam:  4.20 cm MITRAL VALVE  MV Area (PHT): 5.70 cm     SHUNTS MV Decel Time: 133 msec     Systemic VTI:  0.11 m MV E velocity: 145.00 cm/s  Systemic Diam: 2.20 cm Janelle Mediate MD Electronically signed by Janelle Mediate MD Signature Date/Time: 05/07/2024/1:20:00 PM    Final     Cardiac Studies     Patient Profile     89 y.o. female with history of persistent atrial fib s/p ablation with pacemaker in place, chronic diastolic chf, HTN admitted with chest pain.   Assessment & Plan    Chest pain: Cardiac cath today with no evidence of CAD. No pericardial effusion on echo. Her chest pain is not felt to be cardiac related.  Acute on chronic diastolic CHF: She has diuresed well with IV Lasix . Negative 3.2 L since admission. Echo with normal LV function. Lasix  held yesterday. LVEDP 5 mm Hg on cath.  Would not given Lasix  today. Can resume Lasix  40 mg po daily tomorrow Atrial fib: sinus today. Resume Eliquis  tomorrow (one day post cath).  HTN: BP is well controlled. Continue Losartan , Toprol  and aldactone .   Resume Lasix  today. OK to d/c home from cardia perspective. Cardiology will sign off.   For questions or updates, please contact [facility] Please consult [email or web address] for contact info under        Signed, [name], MD  05/09/2024, 8:29 AM

## 2024-05-09 NOTE — Assessment & Plan Note (Signed)
New diagnosis 1 month ago.  Remains in atrial fibrillation with controlled rate.  Patient has elected against anticoagulation due to history of epistaxis.  CHA2DS2-VASc score is at least 4 suggesting a 4.8% yearly stroke risk.  Risk versus benefits discussed with patient.  Recommended he discuss further with his cardiology team. -Continue Toprol-XL -Continue discussions regarding anticoagulation 

## 2024-05-09 NOTE — Progress Notes (Signed)
 Mobility Specialist Progress Note;   05/09/24 1447  Mobility  Activity Refused mobility  Mobility Specialist Start Time (ACUTE ONLY) 1447   Pt politely declining mobility at this time d/t wanting some rest. Pt left in chair with all needs met, call bell in reach.  Janit Meline Mobility Specialist Please contact via SecureChat or [employer] [phone]

## 2024-05-09 NOTE — Assessment & Plan Note (Signed)
 Continue blood pressure control with losartan , metoprolol  and spironolactone.

## 2024-05-09 NOTE — Interval H&P Note (Signed)
 History and Physical Interval Note:  05/09/2024 7:07 AM  [name]  has presented today for surgery, with the diagnosis of unstable angina.  The various methods of treatment have been discussed with the patient and family. After consideration of risks, benefits and other options for treatment, the patient has consented to  Procedure(s): LEFT HEART CATH AND CORONARY ANGIOGRAPHY (N/A) as a surgical intervention.  The patient's history has been reviewed, patient examined, no change in status, stable for surgery.  I have reviewed the patient's chart and labs.  Questions were answered to the patient's satisfaction.   Cath Lab Visit (complete for each Cath Lab visit)  Clinical Evaluation Leading to the Procedure:   ACS: Yes.    Non-ACS:    Anginal Classification: CCS III  Anti-ischemic medical therapy: Minimal Therapy (1 class of medications)  Non-Invasive Test Results: No non-invasive testing performed  Prior CABG: No previous CABG        Donata Fryer [facility] 05/09/2024 7:07 AM

## 2024-05-09 NOTE — Assessment & Plan Note (Signed)
 Follow up as outpatient.

## 2024-05-09 NOTE — Progress Notes (Addendum)
 Progress Note   Patient: [name] NWG:[id number] DOB: 12/18/1932 DOA: 05/06/2024     2 DOS: the patient was seen and examined on 05/09/2024   Brief hospital course: [name] was admitted to the hospital with the working diagnosis of chest pain.    89 y.o. female with a history of hypertension, hypothyroidism, atrial fibrillation/flutter status post AV nodal ablation and [employer] pacemaker, heart failure, PVCs, thoracic aortic aneurysm.  Patient presented secondary to chest pain. Reported 24 hrs of precordial chest pain. On her initial physical examination her blood pressure was 159/88, HR 71, RR 24 and 02 saturation 100%. Lungs with rales bilaterally, with no wheezing, heart with S1 and S2 present and regular with no gallops, rubs or murmurs, abdomen with no distention and positive lower extremity edema, more left than right.   Na 140, K 3,9 Cl 106 bicarbonate 24 glucose 89 bun 19 cr 0,90  Mg 2,0  BNP 439  High sensitive troponin 14 and 16  Wbc 8.6 hgb 11,2 plt 233  Urine analysis SG 1,012, protein negative, large leukocytes with moderate hgb, wbc >50 and rbc 6-10   Chest radiograph with hypoinflation, left rotation, positive cardiomegaly, with bilateral pleural effusions, more left than right, bilateral hilar vascular congestion with pacemaker in place with one right atrial and one right ventricular lead.   Chest CT with bilateral ground glass opacities, with bilateral pleural effusions. Stable ascending thoracic aorta measuring approximately 4.2 cm in greatest diameter. No evidence of aortic dissection.   EKG 74 bpm, normal axis, qtc 458, left bundle branch block, ventricular paced rhythm with no significant ST segment or T wave changes.   Lower extremities doppler ultrasounds negative for deep vein thrombosis.   Patient had furosemide  for diuresis.  05/28 cardiac catheterization with no evidence of coronary artery disease. Patient with persistent chest pain,suspected  costochondritis.   Assessment and Plan: * Acute on chronic diastolic CHF (congestive heart failure) (HCC) Echocardiogram with preserved LV systolic function with EF 60 to 65%, no wall motion abnormalities, RV systolic function preserved, LA and RA with severe dilatation, mild to moderate mitral valve regurgitation.   Urine output is 1,240 ml Systolic blood pressure 129 mmHg range.   Plan to continue heart failure management with losartan , metoprolol  and spironolactone No SGLT 2 inh due to urinary tract infection.  Add 40 meq Kcl today to avoid hypokalemia.  Resume furosemide  in am.   Acute costochondritis Cardiac catheterization wit moderate coronary ectasia with no obstructive coronary artery disease.  Patient continue to have chest pain,which is reproducible to palpation at the left lower sternal border.   Plan for topical diclofenac and 4 doses of oral ibuprofen. Out of bed to chair tid with meals, PT and OT.   Essential hypertension Continue blood pressure control with losartan , metoprolol  and spironolactone.   PAF (paroxysmal atrial fibrillation) (HCC) Continue rate control with metoprolol  and anticoagulation with apixaban  to be resume tomorrow.   Hypothyroidism Continue levothyroxine    ILD (interstitial lung disease) (HCC) Follow up as outpatient.   UTI (urinary tract infection) Patient has completed 3 doses of IV ceftriaxone.  Urine culture with insignificant growth.      Subjective: patient continue to have chest pain, with movement and deep inspiration   Physical Exam: Vitals:   05/09/24 1010 05/09/24 1040 05/09/24 1110 05/09/24 1140  BP: 126/88 111/76 129/89 121/79  Pulse: 69 69 69 70  Resp:      Temp:      TempSrc:  SpO2:      Weight:      Height:       Neurology awake and alert ENT with mild pallor Cardiovascular with S1 and S2 present and regular with no gallops, rubs or murmurs Respiratory with no rales or wheezing no rhonchi  Abdomen with no  lower extremity edema No lower extremity edema Reproducible chest pain on palpation of the left lower sternal rib joint, parasternal line.   Data Reviewed:    Family Communication: I spoke with patient's husband at the bedside, we talked in detail about patient's condition, plan of care and prognosis and all questions were addressed.   Disposition: Status is: Inpatient Remains inpatient appropriate because: chest pain   Planned Discharge Destination: Home     Author: Albertus Alt, MD 05/09/2024 12:28 PM  For on call review [email or web address].

## 2024-05-09 NOTE — Assessment & Plan Note (Addendum)
 Cardiac catheterization wit moderate coronary ectasia with no obstructive coronary artery disease.  Patient continue to have chest pain,which is reproducible to palpation at the left lower sternal border.   Plan for topical diclofenac and 4 doses of oral ibuprofen. Out of bed to chair tid with meals, PT and OT.

## 2024-05-09 NOTE — Progress Notes (Signed)
 Heart Failure Navigator Progress Note  Assessed for Heart & Vascular TOC clinic readiness.  Patient does not meet criteria due to EF 60-65%, has a scheduled [facility] appointment on 05/23/2024. No HF TOC. .   Navigator will sign off at this time.   Randie Bustle, BSN, Scientist, clinical (histocompatibility and immunogenetics) Only

## 2024-05-09 NOTE — Assessment & Plan Note (Addendum)
 Patient has completed 3 doses of IV ceftriaxone.  Urine culture with insignificant growth.

## 2024-05-09 NOTE — Assessment & Plan Note (Signed)
 Continue levothyroxine 

## 2024-05-10 ENCOUNTER — Other Ambulatory Visit: Payer: Self-pay | Admitting: *Deleted

## 2024-05-10 ENCOUNTER — Other Ambulatory Visit (HOSPITAL_COMMUNITY): Payer: Self-pay

## 2024-05-10 DIAGNOSIS — M940 Chondrocostal junction syndrome [Tietze]: Secondary | ICD-10-CM | POA: Diagnosis not present

## 2024-05-10 DIAGNOSIS — J849 Interstitial pulmonary disease, unspecified: Secondary | ICD-10-CM

## 2024-05-10 DIAGNOSIS — I5033 Acute on chronic diastolic (congestive) heart failure: Secondary | ICD-10-CM | POA: Diagnosis not present

## 2024-05-10 DIAGNOSIS — I10 Essential (primary) hypertension: Secondary | ICD-10-CM | POA: Diagnosis not present

## 2024-05-10 DIAGNOSIS — I480 Paroxysmal atrial fibrillation: Secondary | ICD-10-CM | POA: Diagnosis not present

## 2024-05-10 LAB — BASIC METABOLIC PANEL WITH GFR
Anion gap: 7 (ref 5–15)
BUN: 23 mg/dL (ref 8–23)
CO2: 25 mmol/L (ref 22–32)
Calcium: 8.6 mg/dL — ABNORMAL LOW (ref 8.9–10.3)
Chloride: 104 mmol/L (ref 98–111)
Creatinine, Ser: 1 mg/dL (ref 0.44–1.00)
GFR, Estimated: 54 mL/min — ABNORMAL LOW (ref >60)
Glucose, Bld: 97 mg/dL (ref 70–99)
Potassium: 4.3 mmol/L (ref 3.5–5.1)
Sodium: 136 mmol/L (ref 135–145)

## 2024-05-10 MED ORDER — FUROSEMIDE 40 MG PO TABS
40.0000 mg | ORAL_TABLET | Freq: Every day | ORAL | 0 refills | Status: AC
  Filled 2024-05-10: qty 30, 30d supply, fill #0

## 2024-05-10 MED ORDER — APIXABAN 5 MG PO TABS
5.0000 mg | ORAL_TABLET | Freq: Two times a day (BID) | ORAL | Status: DC
  Administered 2024-05-10: 5 mg via ORAL
  Filled 2024-05-10: qty 1

## 2024-05-10 MED ORDER — SPIRONOLACTONE 25 MG PO TABS
25.0000 mg | ORAL_TABLET | Freq: Every day | ORAL | 0 refills | Status: DC
  Filled 2024-05-10: qty 30, 30d supply, fill #0

## 2024-05-10 MED ORDER — DICLOFENAC SODIUM 1 % EX GEL
2.0000 g | Freq: Four times a day (QID) | CUTANEOUS | 0 refills | Status: DC
  Filled 2024-05-10: qty 100, 13d supply, fill #0

## 2024-05-10 MED ORDER — FUROSEMIDE 40 MG PO TABS
40.0000 mg | ORAL_TABLET | Freq: Every day | ORAL | Status: DC
  Administered 2024-05-10: 40 mg via ORAL
  Filled 2024-05-10: qty 1

## 2024-05-10 NOTE — Evaluation (Signed)
 Physical Therapy Evaluation Patient Details Name: [name] MRN: [id number] DOB: November 15, 1933 Today's Date: 05/10/2024  History of Present Illness  Patient is an 89 y/o female admitted with chest pain, found to have bilateral pleural effusions and stable thoracic ascending aortic aneurysm and with LE edema L>R.  She was diuresed and was negative for DVT, had cardiac cath on 5/28 which was negative for CAD.  Also positive for UTI and treated. PMH positive for HTN, hypothyroidism, a-fib s/p AV notal ablation and pacemaker, heart failure, PVC's, and thoracic aortic aneurysm.  Clinical Impression  Patient presents with decreased mobility due to generalized weakness after bedrest here since Sunday.  She relates previously not using devices in the home, but cane when out.  She was needing to have elevation to toilet and reliant on UE's for sit to stand.  She denies recent falls and states continues to grocery shop.  Currently needing min to mod A for sit to stand (height dependent) and hand hold assist versus rollator for ambulation up to 130'.  She reports her spouse will help her on steps and she will use the rail and her cane.  Feel she would benefit from rollator walker for home (which she agrees with) and HHPT (which she declined).  Will not follow up as planned d/c home today.        If plan is discharge home, recommend the following: A little help with walking and/or transfers;Help with stairs or ramp for entrance;Assistance with cooking/housework;Assist for transportation;A little help with bathing/dressing/bathroom   Can travel by private vehicle        Equipment Recommendations Rollator (4 wheels)  Recommendations for Other Services       Functional Status Assessment Patient has had a recent decline in their functional status and demonstrates the ability to make significant improvements in function in a reasonable and predictable amount of time.     Precautions / Restrictions  Precautions Precautions: Fall Recall of Precautions/Restrictions: Intact Restrictions Weight Bearing Restrictions Per Provider Order: No      Mobility  Bed Mobility Overal bed mobility: Needs Assistance Bed Mobility: Supine to Sit     Supine to sit: Supervision, HOB elevated, Used rails     General bed mobility comments: increased time and used rails    Transfers Overall transfer level: Needs assistance Equipment used: 1 person hand held assist Transfers: Sit to/from Stand Sit to Stand: Min assist, Mod assist           General transfer comment: min A from EOB at elevated height, needing HHA for balance, mod A from low toilet using grabbars    Ambulation/Gait Ambulation/Gait assistance: Min assist, Supervision Gait Distance (Feet): 130 Feet Assistive device: 1 person hand held assist, Rollator (4 wheels) Gait Pattern/deviations: Step-through pattern, Decreased stride length, Wide base of support       General Gait Details: initially HHA only and needing support for balance, used rollator in hallway with S.  Stairs Stairs:  (discussed stairs for entry, will need rail, cane and spouse to assist)          Wheelchair Mobility     Tilt Bed    Modified Rankin (Stroke Patients Only)       Balance Overall balance assessment: Needs assistance   Sitting balance-Leahy Scale: Good     Standing balance support: No upper extremity supported Standing balance-Leahy Scale: Fair Standing balance comment: standing to wash hands with knees locked out  Pertinent Vitals/Pain Pain Assessment Pain Assessment: No/denies pain    Home Living Family/patient expects to be discharged to:: Private residence Living Arrangements: Spouse/significant other Available Help at Discharge: Family Type of Home: House Home Access: Stairs to enter Entrance Stairs-Rails: Left Entrance Stairs-Number of Steps: 3   Home Layout: One  level Home Equipment: Grab bars - tub/shower;Toilet riser;Cane - single point      Prior Function Prior Level of Function : Independent/Modified Independent             Mobility Comments: goes to grocery store, but has not driven lately due to memory problems; has a housekeeper, uses cane outdoors       Extremity/Trunk Assessment   Upper Extremity Assessment Upper Extremity Assessment: Generalized weakness    Lower Extremity Assessment Lower Extremity Assessment: RLE deficits/detail;LLE deficits/detail RLE Deficits / Details: note skin changes with chronic edema and pt reported PVD with ulcers she treates at home with dressing changes and ointment; strength grossly 4/5 throughout with previous history difficulty with sit to stand esp lower seats RLE Sensation: WNL LLE Deficits / Details: note skin changes with chronic edema and pt reported PVD with ulcers she treates at home with dressing changes and ointment; strength grossly 4/5 throughout with previous history difficulty with sit to stand esp lower seats LLE Sensation: WNL    Cervical / Trunk Assessment Cervical / Trunk Assessment: Kyphotic  Communication   Communication Communication: No apparent difficulties    Cognition Arousal: Alert Behavior During Therapy: WFL for tasks assessed/performed                             Following commands: Intact       Cueing       General Comments General comments (skin integrity, edema, etc.): Discussed need for continued LE strengthening since hospitalization, she declined HHPT stating agrees with rollator and will walk in the home, also educated on sit to stand for strengthening    Exercises     Assessment/Plan    PT Assessment Patient does not need any further PT services  PT Problem List         PT Treatment Interventions      PT Goals (Current goals can be found in the Care Plan section)  Acute Rehab PT Goals PT Goal Formulation: All assessment  and education complete, DC therapy    Frequency       Co-evaluation               AM-PAC PT "6 Clicks" Mobility  Outcome Measure Help needed turning from your back to your side while in a flat bed without using bedrails?: None Help needed moving from lying on your back to sitting on the side of a flat bed without using bedrails?: A Little Help needed moving to and from a bed to a chair (including a wheelchair)?: A Little Help needed standing up from a chair using your arms (e.g., wheelchair or bedside chair)?: A Little Help needed to walk in hospital room?: A Little Help needed climbing 3-5 steps with a railing? : A Little 6 Click Score: 19    End of Session Equipment Utilized During Treatment: Gait belt Activity Tolerance: Patient tolerated treatment well Patient left: in bed;with call bell/phone within reach Nurse Communication: Other (comment) (ready to get dressed for d/c) PT Visit Diagnosis: Muscle weakness (generalized) (M62.81)    Time: 1115-1140 PT Time Calculation (min) (ACUTE ONLY): 25 min  Charges:   PT Evaluation $PT Eval Moderate Complexity: 1 Mod PT Treatments $Gait Training: 8-22 mins PT General Charges $$ ACUTE PT VISIT: 1 Visit         Abigail Hoff, PT Acute Rehabilitation Services [phone] 05/10/2024   [name] 05/10/2024, 11:52 AM

## 2024-05-10 NOTE — IPAL (Signed)
 I spoke with patient's son at the bedside. I explained poor prognosis in the setting of heart failure, cardiorenal syndrome and severe cellulitis/ sepsis. He will update the rest of patient's family and will let the team know about future advance directives.

## 2024-05-10 NOTE — Progress Notes (Signed)
 Mobility Specialist Progress Note;   05/10/24 1004  Mobility  Activity Ambulated with assistance in hallway  Level of Assistance Contact guard assist, steadying assist  Assistive Device Front wheel walker  Distance Ambulated (ft) 200 ft  Activity Response Tolerated well  Mobility Referral Yes  Mobility visit 1 Mobility  Mobility Specialist Start Time (ACUTE ONLY) 1004  Mobility Specialist Stop Time (ACUTE ONLY) 1029  Mobility Specialist Time Calculation (min) (ACUTE ONLY) 25 min   Pt agreeable to mobility. Required light MinG assistance during ambulation for safety. VSS throughout and no c/o of dizziness when asked. Pt returned back to bed with all needs met, alarm on.   Janit Meline Mobility Specialist Please contact via SecureChat or [employer] [phone]

## 2024-05-10 NOTE — TOC Transition Note (Addendum)
 Transition of Care [facility]) - Discharge Note   Patient Details  Name: [name] MRN: [id number] Date of Birth: October 29, 1933  Transition of Care [facility]) CM/SW Contact:  Cosimo Diones, RN Phone Number: 05/10/2024, 11:45 AM   Clinical Narrative:  Patient will discharge today and has declined home health services. Rolling walker with seat ordered via [employer] and it will be delivered to the room. Spouse to provide transportation home. No further needs identified.   DME rollator has to go through [employer]- dme will be delivered to the home once authorized.     Final next level of care: Home/Self Care (patient declined home health) Barriers to Discharge: No Barriers Identified   Patient Goals and CMS Choice Patient states their goals for this hospitalization and ongoing recovery are:: plan to return home once stable  Discharge Plan and Services Additional resources added to the After Visit Summary for   In-house Referral: NA Discharge Planning Services: CM Consult Post Acute Care Choice: Home Health          DME Arranged: Walker rolling with seat DME Agency: [facility] Date DME Agency Contacted: 05/10/24 Time DME Agency Contacted: 1144 Representative spoke with at DME Agency: Marlou Sims HH Arranged: Refused HH          Social Drivers of Health (SDOH) Interventions SDOH Screenings   Food Insecurity: No Food Insecurity (05/06/2024)  Housing: Low Risk  (05/06/2024)  Transportation Needs: No Transportation Needs (05/06/2024)  Utilities: Not At Risk (05/06/2024)  Social Connections: Patient Declined (05/06/2024)  Tobacco Use: Low Risk  (05/06/2024)     Readmission Risk Interventions     No data to display

## 2024-05-10 NOTE — Discharge Summary (Addendum)
 Physician Discharge Summary   Patient: [name] MRN: [id number] DOB: 08/11/1933  Admit date:     05/06/2024  Discharge date: 05/10/24  Discharge Physician: Curlee Doss Dura Mccormack   PCP: Tena Feeling, MD   Recommendations at discharge:    Patient has been placed on furosemide  and spironolactone  for diuresis in the setting of diastolic heart failure. No SGLT 2 inh due to risk of urine infections.  Continue topical diclofenac  for costochondritis.  Follow up renal function and electrolytes in 7 days as outpatient.  Follow up with Dr Candi Chafe in 7 to 10 days Follow up with Cardiology as scheduled.   I spoke with patient's husband over the phone we talked in detail about patient's condition, plan of care and prognosis and all questions were addressed.   Discharge Diagnoses: Principal Problem:   Acute on chronic diastolic CHF (congestive heart failure) (HCC) Active Problems:   Acute costochondritis   Essential hypertension   PAF (paroxysmal atrial fibrillation) (HCC)   Hypothyroidism   ILD (interstitial lung disease) (HCC)   UTI (urinary tract infection)  Resolved Problems:   * No resolved hospital problems. [facility] Course: [name] was admitted to the hospital with the working diagnosis of chest pain.    89 y.o. female with a history of hypertension, hypothyroidism, atrial fibrillation/flutter status post AV nodal ablation and [employer] pacemaker, heart failure, PVCs, thoracic aortic aneurysm.  Patient presented secondary to chest pain. Reported 24 hrs of precordial chest pain. On her initial physical examination her blood pressure was 159/88, HR 71, RR 24 and 02 saturation 100%. Lungs with rales bilaterally, with no wheezing, heart with S1 and S2 present and regular with no gallops, rubs or murmurs, abdomen with no distention and positive lower extremity edema, more left than right.   Na 140, K 3,9 Cl 106 bicarbonate 24 glucose 89 bun 19 cr 0,90  Mg 2,0  BNP 439  High  sensitive troponin 14 and 16  Wbc 8.6 hgb 11,2 plt 233  Urine analysis SG 1,012, protein negative, large leukocytes with moderate hgb, wbc >50 and rbc 6-10   Chest radiograph with hypoinflation, left rotation, positive cardiomegaly, with bilateral pleural effusions, more left than right, bilateral hilar vascular congestion with pacemaker in place with one right atrial and one right ventricular lead.   Chest CT with bilateral ground glass opacities, with bilateral pleural effusions. Stable ascending thoracic aorta measuring approximately 4.2 cm in greatest diameter. No evidence of aortic dissection.   EKG 74 bpm, normal axis, qtc 458, left bundle branch block, ventricular paced rhythm with no significant ST segment or T wave changes.   Lower extremities doppler ultrasounds negative for deep vein thrombosis.   Patient had furosemide  for diuresis.  05/28 cardiac catheterization with no evidence of coronary artery disease. Patient with persistent chest pain,suspected costochondritis.  05/29 chest pain has improved, plan to follow up as outpatient.   Assessment and Plan: * Acute on chronic diastolic CHF (congestive heart failure) (HCC) Echocardiogram with preserved LV systolic function with EF 60 to 65%, no wall motion abnormalities, RV systolic function preserved, LA and RA with severe dilatation, mild to moderate mitral valve regurgitation.   Urine output is 1,240 ml Systolic blood pressure 129 mmHg range.   Plan to continue heart failure management with losartan , metoprolol  and spironolactone  No SGLT 2 inh due to urinary tract infection.  Add 40 meq Kcl today to avoid hypokalemia.  Resume furosemide  in am.   Acute costochondritis Cardiac catheterization wit moderate  coronary ectasia with no obstructive coronary artery disease.  Patient continue to have chest pain,which is reproducible to palpation at the left lower sternal border.   Plan for topical diclofenac  and 4 doses of oral  ibuprofen . Out of bed to chair tid with meals, PT and OT.   Essential hypertension Continue blood pressure control with losartan , metoprolol  and spironolactone .   PAF (paroxysmal atrial fibrillation) (HCC) Continue rate control with metoprolol  and anticoagulation with apixaban  to be resume tomorrow.   Hypothyroidism Continue levothyroxine    ILD (interstitial lung disease) (HCC) Follow up as outpatient.   UTI (urinary tract infection) Patient has completed 3 doses of IV ceftriaxone .  Urine culture with insignificant growth.   Consultants: cardiology  Procedures performed: cardiac catheterization with coronary angiography   Disposition: Home Diet recommendation:  Cardiac diet DISCHARGE MEDICATION: Allergies as of 05/10/2024       Reactions   Adhesive [tape] Rash, Other (See Comments)   Including EKG electrodes and the like   Codeine Other (See Comments)   Reaction not recalled   Latex Itching   Oxycodone-aspirin  Nausea And Vomiting   Percodan [oxycodone-aspirin ] Nausea And Vomiting   Quinolones    Patient with ATAA (ascending thoracic aortic aneurysm)        Medication List     TAKE these medications    acetaminophen  325 MG tablet Commonly known as: TYLENOL  Take 2 tablets (650 mg total) by mouth every 6 (six) hours as needed for mild pain (pain score 1-3) (or Fever >/= 101).   Aerochamber Plus Device Needs Aerochamber with Inhalers   apixaban  5 MG Tabs tablet Commonly known as: Eliquis  Take 1 tablet (5 mg total) by mouth 2 (two) times daily.   budesonide -formoterol  160-4.5 MCG/ACT inhaler Commonly known as: Symbicort  Inhale 2 puffs into the lungs 2 (two) times daily.   clobetasol cream 0.05 % Commonly known as: TEMOVATE Apply 1 Application topically 2 (two) times daily.   diclofenac  Sodium 1 % Gel Commonly known as: VOLTAREN  Apply 2 g topically 4 (four) times daily. Apply to tender point, at the chest, sternum costal joint.   estradiol  0.1 MG/GM  vaginal cream Commonly known as: ESTRACE  Place 0.5g nightly for two weeks then twice a week after   furosemide  40 MG tablet Commonly known as: LASIX  Take 1 tablet (40 mg total) by mouth daily.   Gemtesa  75 MG Tabs Generic drug: Vibegron  Take 1 tablet (75 mg total) by mouth daily. What changed: Another medication with the same name was removed. Continue taking this medication, and follow the directions you see here.   levothyroxine  125 MCG tablet Commonly known as: SYNTHROID  Take 125 mcg by mouth daily before breakfast.   losartan  50 MG tablet Commonly known as: COZAAR  Take 1 tablet (50 mg total) by mouth daily.   LUBRICATING EYE DROPS OP Place 1 drop into both eyes 3 (three) times daily as needed (for dryness).   metoprolol  succinate 25 MG 24 hr tablet Commonly known as: TOPROL -XL Take 1 tablet (25 mg total) by mouth daily.   mupirocin ointment 2 % Commonly known as: BACTROBAN Apply 1 Application topically See admin instructions. Apply to affected area/ulcer on the right leg once a day   pentoxifylline  400 MG CR tablet Commonly known as: TRENTAL  Take 400 mg by mouth daily.   spironolactone  25 MG tablet Commonly known as: ALDACTONE  Take 1 tablet (25 mg total) by mouth daily. Start taking on: May 11, 2024   triamcinolone  cream 0.1 % Commonly known as: KENALOG  Apply  1 Application topically daily as needed (for rashes- affected areas).   Vitamin D (Ergocalciferol) 1.25 MG (50000 UNIT) Caps capsule Commonly known as: DRISDOL Take 50,000 Units by mouth every Thursday.        Discharge Exam: Filed Weights   05/09/24 0325 05/10/24 0446 05/10/24 0500  Weight: 72.4 kg 72.6 kg 72.4 kg   BP (!) 101/59 (BP Location: Right Wrist)   Pulse 70   Temp 98.4 F (36.9 C) (Oral)   Resp 19   Ht 5' 7.5" (1.715 m)   Wt 72.4 kg   LMP  (LMP Unknown)   SpO2 94%   BMI 24.64 kg/m   Patient is feeling better, dyspnea resolved and chest pain has been improving,  Neurology  awake and alert ENT with mild pallor Cardiovascular with S1 and S2 present and regular with no gallops, rubs or murmurs Respiratory with no rales or wheezing, no rhonchi  Abdomen with no distention, soft and non tender No lower extremity edema   Condition at discharge: stable  The results of significant diagnostics from this hospitalization (including imaging, microbiology, ancillary and laboratory) are listed below for reference.   Imaging Studies: CARDIAC CATHETERIZATION Result Date: 05/09/2024   LV end diastolic pressure is normal. Moderate coronary ectasia, no obstructive CAD Low LVEDP 5 mm Hg Plan: consider other causes of chest pain.   ECHOCARDIOGRAM COMPLETE Result Date: 05/07/2024    ECHOCARDIOGRAM REPORT   Patient Name:   [name] Date of Exam: 05/07/2024 Medical Rec #:  [id number]      Height:       67.5 in Accession #:    [id number]     Weight:       163.1 lb Date of Birth:  05/27/1933      BSA:          1.865 m Patient Age:    89 years       BP:           132/81 mmHg Patient Gender: F              HR:           71 bpm. Exam Location:  Inpatient Procedure: 2D Echo, Cardiac Doppler, Color Doppler and Intracardiac            Opacification Agent (Both Spectral and Color Flow Doppler were            utilized during procedure). Indications:    R07.9* Chest pain, unspecified  History:        Patient has prior history of Echocardiogram examinations, most                 recent 05/05/2022. Pacemaker, Arrythmias:Atrial Fibrillation;                 Risk Factors:Hypertension.  Sonographer:    Sherline Distel Senior RDCS Referring Phys: Johnetta Nab  Sonographer Comments: Technically difficult due to poor echo windows. IMPRESSIONS  1. Left ventricular ejection fraction, by estimation, is 60 to 65%. The left ventricle has normal function. The left ventricle has no regional wall motion abnormalities. Left ventricular diastolic parameters are indeterminate.  2. Right ventricular systolic function is  normal. The right ventricular size is normal.  3. Left atrial size was severely dilated.  4. Right atrial size was severely dilated.  5. The mitral valve is abnormal. Mild to moderate mitral valve regurgitation. No evidence of mitral stenosis.  6. The aortic valve is tricuspid. There is mild calcification of  the aortic valve. There is mild thickening of the aortic valve. Aortic valve regurgitation is trivial. Aortic valve sclerosis is present, with no evidence of aortic valve stenosis.  7. Aortic dilatation noted. There is moderate dilatation of the ascending aorta, measuring 42 mm.  8. The inferior vena cava is normal in size with greater than 50% respiratory variability, suggesting right atrial pressure of 3 mmHg. FINDINGS  Left Ventricle: Left ventricular ejection fraction, by estimation, is 60 to 65%. The left ventricle has normal function. The left ventricle has no regional wall motion abnormalities. Definity contrast agent was given IV to delineate the left ventricular  endocardial borders. Strain was performed and the global longitudinal strain is indeterminate. The left ventricular internal cavity size was normal in size. There is no left ventricular hypertrophy. Left ventricular diastolic parameters are indeterminate. Right Ventricle: The right ventricular size is normal. No increase in right ventricular wall thickness. Right ventricular systolic function is normal. Left Atrium: Left atrial size was severely dilated. Right Atrium: Right atrial size was severely dilated. Pericardium: There is no evidence of pericardial effusion. Mitral Valve: The mitral valve is abnormal. There is mild thickening of the mitral valve leaflet(s). There is mild calcification of the mitral valve leaflet(s). Mild mitral annular calcification. Mild to moderate mitral valve regurgitation. No evidence of mitral valve stenosis. Tricuspid Valve: The tricuspid valve is normal in structure. Tricuspid valve regurgitation is mild . No  evidence of tricuspid stenosis. Aortic Valve: The aortic valve is tricuspid. There is mild calcification of the aortic valve. There is mild thickening of the aortic valve. Aortic valve regurgitation is trivial. Aortic valve sclerosis is present, with no evidence of aortic valve stenosis. Pulmonic Valve: The pulmonic valve was normal in structure. Pulmonic valve regurgitation is trivial. No evidence of pulmonic stenosis. Aorta: Aortic dilatation noted. There is moderate dilatation of the ascending aorta, measuring 42 mm. Venous: The inferior vena cava is normal in size with greater than 50% respiratory variability, suggesting right atrial pressure of 3 mmHg. IAS/Shunts: No atrial level shunt detected by color flow Doppler. Additional Comments: 3D was performed not requiring image post processing on an independent workstation and was indeterminate.  LEFT VENTRICLE PLAX 2D LVOT diam:     2.20 cm   Diastology LV SV:         43        LV e' lateral:   3.83 cm/s LV SV Index:   23        LV E/e' lateral: 37.9 LVOT Area:     3.80 cm  RIGHT VENTRICLE RV S prime:     10.20 cm/s TAPSE (M-mode): 1.5 cm LEFT ATRIUM            Index        RIGHT ATRIUM           Index LA Vol (A2C): 124.0 ml 66.50 ml/m  RA Area:     29.20 cm LA Vol (A4C): 120.0 ml 64.35 ml/m  RA Volume:   90.50 ml  48.53 ml/m  AORTIC VALVE LVOT Vmax:   57.40 cm/s LVOT Vmean:  40.800 cm/s LVOT VTI:    0.113 m  AORTA Ao Root diam: 3.40 cm Ao Asc diam:  4.20 cm MITRAL VALVE MV Area (PHT): 5.70 cm     SHUNTS MV Decel Time: 133 msec     Systemic VTI:  0.11 m MV E velocity: 145.00 cm/s  Systemic Diam: 2.20 cm Janelle Mediate MD Electronically signed by Janelle Mediate MD  Signature Date/Time: 05/07/2024/1:20:00 PM    Final    VAS US  LOWER EXTREMITY VENOUS (DVT) (ONLY [facility] & [facility]) Result Date: 05/06/2024  Lower Venous DVT Study Patient Name:  [name]  Date of Exam:   05/06/2024 Medical Rec #: [id number]       Accession #:    [id number] Date of Birth: 1933/11/07        Patient Gender: F Patient Age:   27 years Exam Location:  [facility] Procedure:      VAS US  LOWER EXTREMITY VENOUS (DVT) Referring Phys: JON KNAPP --------------------------------------------------------------------------------  Indications: Swelling, and Pain.  Comparison Study: 01/31/2024 Performing Technologist: Gelene Kelly RDCS  Examination Guidelines: A complete evaluation includes B-mode imaging, spectral Doppler, color Doppler, and power Doppler as needed of all accessible portions of each vessel. Bilateral testing is considered an integral part of a complete examination. Limited examinations for reoccurring indications may be performed as noted. The reflux portion of the exam is performed with the patient in reverse Trendelenburg.  +-----+---------------+---------+-----------+----------+--------------+ RIGHTCompressibilityPhasicitySpontaneityPropertiesThrombus Aging +-----+---------------+---------+-----------+----------+--------------+ CFV  Full           Yes      Yes                                 +-----+---------------+---------+-----------+----------+--------------+   +---------+---------------+---------+-----------+----------+--------------+ LEFT     CompressibilityPhasicitySpontaneityPropertiesThrombus Aging +---------+---------------+---------+-----------+----------+--------------+ CFV      Full           Yes      Yes                                 +---------+---------------+---------+-----------+----------+--------------+ SFJ      Full           Yes      Yes                                 +---------+---------------+---------+-----------+----------+--------------+ FV Prox  Full           Yes      Yes                                 +---------+---------------+---------+-----------+----------+--------------+ FV Mid   Full           Yes      Yes                                  +---------+---------------+---------+-----------+----------+--------------+ FV DistalFull           Yes      Yes                                 +---------+---------------+---------+-----------+----------+--------------+ PFV      Full                                                        +---------+---------------+---------+-----------+----------+--------------+ POP      Full  Yes      Yes                                 +---------+---------------+---------+-----------+----------+--------------+ PTV      Full           Yes      Yes                                 +---------+---------------+---------+-----------+----------+--------------+ PERO     Full           Yes      Yes                                 +---------+---------------+---------+-----------+----------+--------------+ Gastroc  Full                                                        +---------+---------------+---------+-----------+----------+--------------+ GSV      Full           Yes      Yes                                 +---------+---------------+---------+-----------+----------+--------------+   Findings reported to Dr. Monnie Anthony at 3:30.  Summary: RIGHT: - No evidence of common femoral vein obstruction.   LEFT: - No evidence of deep vein thrombosis in the lower extremity. No indirect evidence of obstruction proximal to the inguinal ligament.  - No cystic structure found in the popliteal fossa.  *See table(s) above for measurements and observations. Electronically signed by Irvin Mantel on 05/06/2024 at 7:42:32 PM.    Final    CT Angio Chest Aorta W and/or Wo Contrast Result Date: 05/06/2024 CLINICAL DATA:  Suspect acute aortic syndrome. EXAM: CT ANGIOGRAPHY CHEST WITH CONTRAST TECHNIQUE: Multidetector CT imaging of the chest was performed using the standard protocol during bolus administration of intravenous contrast. Multiplanar CT image reconstructions and MIPs were obtained to  evaluate the vascular anatomy. RADIATION DOSE REDUCTION: This exam was performed according to the departmental dose-optimization program which includes automated exposure control, adjustment of the mA and/or kV according to patient size and/or use of iterative reconstruction technique. CONTRAST:  75mL OMNIPAQUE IOHEXOL 350 MG/ML SOLN COMPARISON:  12/09/2023 FINDINGS: Cardiovascular: Dual lead left-sided cardiac pacemaker. Persistent cardiomegaly with evidence of biatrial enlargement. Ascending thoracic aorta measures approximately 4.2 cm in greatest diameter without significant change. No evidence of dissection. Mild calcified plaque over the descending thoracic aorta. Normal 3 vessel takeoff of the aortic arch. Poor opacification over the pulmonary arterial system. Remaining vascular structures are unremarkable. Mediastinum/Nodes: No mediastinal or hilar adenopathy. Remaining mediastinal structures are unremarkable. Lungs/Pleura: Lungs are adequately inflated demonstrate small bilateral pleural effusions right greater than left with minimal patchy posterior right base opacification likely atelectasis although could not exclude early infection. Mild linear atelectasis over the lingula. Mild scarring over the posterior apices. Airways are unremarkable. Upper Abdomen: Minimal calcified plaque over the abdominal aorta. No acute findings in the upper abdomen. Musculoskeletal: No acute findings. Review of the MIP images confirms the above findings. IMPRESSION: 1. Small bilateral pleural effusions right  greater than left with minimal patchy posterior right base opacification likely atelectasis, although could not exclude early infection. 2. Stable cardiomegaly with evidence of biatrial enlargement. 3. Stable ascending thoracic aorta measuring approximately 4.2 cm in greatest diameter. No evidence of aortic dissection. Recommend annual imaging followup by CTA or MRA. This recommendation follows 2010  ACCF/AHA/AATS/ACR/ASA/SCA/SCAI/SIR/STS/SVM Guidelines for the Diagnosis and Management of Patients with Thoracic Aortic Disease. Circulation. 2010; 121: Z610-R604. Aortic aneurysm NOS (ICD10-I71.9). 4. Aortic atherosclerosis. Aortic Atherosclerosis (ICD10-I70.0). Electronically Signed   By: Roda Cirri M.D.   On: 05/06/2024 15:49   DG Chest 2 View Result Date: 05/06/2024 CLINICAL DATA:  Chest pain EXAM: CHEST - 2 VIEW COMPARISON:  X-ray 03/14/2024 and older FINDINGS: Underinflation. Enlarged cardiopericardial silhouette with calcified tortuous aorta. Increasing left retrocardiac opacity and effusion. Apical pleural thickening. Left upper chest pacemaker. No edema. Interstitial changes are again noted. Overlapping cardiac leads. Films are under penetrated. Likely tiny right effusion as well. Surgical changes along the left humeral head. IMPRESSION: Increasing left lung base opacity and effusion. Tiny right effusion. Enlarged cardiopericardial silhouette vascular congestion. Pacemaker. Recommend follow-up Electronically Signed   By: Adrianna Horde M.D.   On: 05/06/2024 12:36    Microbiology: Results for orders placed or performed during the hospital encounter of 05/06/24  Urine Culture (for pregnant, neutropenic or urologic patients or patients with an indwelling urinary catheter)     Status: Abnormal   Collection Time: 05/07/24  8:21 AM   Specimen: Urine, Clean Catch  Result Value Ref Range Status   Specimen Description URINE, CLEAN CATCH  Final   Special Requests NONE  Final   Culture (A)  Final    <10,000 COLONIES/mL INSIGNIFICANT GROWTH Performed at [facility], [address] [place], [place] [address]    Report Status 05/08/2024 FINAL  Final    Labs: CBC: Recent Labs  Lab 05/06/24 1155 05/07/24 0418 05/09/24 0339  WBC 8.6 7.6 6.7  HGB 11.2* 11.2* 12.1  HCT 35.2* 34.8* 37.3  MCV 97.5 94.6 94.4  PLT 233 251 239   Basic Metabolic Panel: Recent Labs  Lab 05/06/24 1155  05/06/24 1625 05/07/24 0418 05/08/24 1045 05/09/24 0339 05/10/24 0446  NA 140  --  138 139 138 136  K 3.9  --  3.5 3.5 3.8 4.3  CL 106  --  104 103 103 104  CO2 24  --  24 27 23 25   GLUCOSE 89  --  95 151* 98 97  BUN 19  --  15 18 18 23   CREATININE 0.90  --  0.86 1.19* 0.89 1.00  CALCIUM 9.0  --  8.5* 8.7* 8.8* 8.6*  MG  --  2.0  --  2.0  --   --    Liver Function Tests: Recent Labs  Lab 05/07/24 0418  AST 18  ALT 10  ALKPHOS 72  BILITOT 0.6  PROT 6.2*  ALBUMIN 3.0*   CBG: No results for input(s): "GLUCAP" in the last 168 hours.  Discharge time spent: greater than 30 minutes.  Signed: Albertus Alt, MD [facility] 05/10/2024

## 2024-05-10 NOTE — Plan of Care (Signed)
  Problem: Activity: Goal: Ability to tolerate increased activity will improve Outcome: Progressing   Problem: Cardiac: Goal: Ability to achieve and maintain adequate cardiovascular perfusion will improve Outcome: Progressing   Problem: Clinical Measurements: Goal: Respiratory complications will improve Outcome: Progressing Goal: Cardiovascular complication will be avoided Outcome: Progressing   Problem: Pain Managment: Goal: General experience of comfort will improve and/or be controlled Outcome: Progressing

## 2024-05-10 NOTE — Telephone Encounter (Signed)
 Patient's oxygen was discontinued at last OV.  Order was sent to [facility].  Received CMN

## 2024-05-10 NOTE — Telephone Encounter (Signed)
 Dup CMN rc'd.

## 2024-05-10 NOTE — Progress Notes (Signed)
 Reviewed AVS, patient expressed understanding of medications, MD follow up reviewed.   Removed IV, Site clean, dry and intact.  Patient states all belongings brought to the hospital at time of admission are accounted for and packed to take home.  Picked up medications from [facility] pharmacy. Pt transported to Discharge lounge to wait for rolling walker.

## 2024-05-11 DIAGNOSIS — I5033 Acute on chronic diastolic (congestive) heart failure: Secondary | ICD-10-CM | POA: Diagnosis not present

## 2024-05-11 LAB — LIPOPROTEIN A (LPA): Lipoprotein (a): 48.9 nmol/L — ABNORMAL HIGH (ref <75.0)

## 2024-05-14 ENCOUNTER — Ambulatory Visit: Admitting: Podiatry

## 2024-05-14 ENCOUNTER — Encounter: Payer: Self-pay | Admitting: Podiatry

## 2024-05-14 DIAGNOSIS — L97522 Non-pressure chronic ulcer of other part of left foot with fat layer exposed: Secondary | ICD-10-CM | POA: Diagnosis not present

## 2024-05-14 NOTE — Progress Notes (Signed)
 Ulcer LT bunion and RT 5th toe. Callus RT forefoot   Chief Complaint  Patient presents with   Wound Check    "They seem to be doing okay.  I have another problem.  At night, the heel of my feet wakes me up at night.  It feels like pins and needles."    Subjective: 89 y.o. female for follow-up evaluation of ulcers bilateral feet.  She has recently been admitted to the hospital for chest pain.  She has also been experiencing pain and tenderness associated to the right foot at nighttime when she lays down.  During the day when her foot is in a dependent position she does not have any pain   Past Medical History:  Diagnosis Date   Allergic rhinitis    Arthritis    bil knees   Atypical atrial flutter (HCC) 04/22/2010   Qualifier: Diagnosis of   By: Sallyanne Creamer CMA, Jewel         Breast cancer, left breast (HCC) 01/15/2015   Invasive Mammary   Chronic venous insufficiency    Hematuria    negative workup - Dr. Theotis Flake   Hypertension    Hypothyroidism    Migraine headache    MVP (mitral valve prolapse)    OAB (overactive bladder)    Pacemaker    PAF (paroxysmal atrial fibrillation) (HCC)    SVT (supraventricular tachycardia) (HCC) 06/2010   s/p AVNRT ablation   Wears glasses     Past Surgical History:  Procedure Laterality Date   A-FLUTTER ABLATION N/A 02/04/2017   Procedure: A-Flutter Ablation;  Surgeon: Tammie Fall, MD;  Location: [facility] INVASIVE CV LAB;  Service: Cardiovascular;  Laterality: N/A;   ABDOMINAL HYSTERECTOMY     AV NODE ABLATION N/A 08/17/2017   Procedure: AV Node Ablation;  Surgeon: Tammie Fall, MD;  Location: [facility] INVASIVE CV LAB;  Service: Cardiovascular;  Laterality: N/A;   BREAST LUMPECTOMY WITH RADIOACTIVE SEED LOCALIZATION Left 02/14/2015   Procedure: BREAST LUMPECTOMY WITH RADIOACTIVE SEED LOCALIZATION;  Surgeon: Juanita Norlander, MD;  Location: [facility];  Service: General;  Laterality: Left;   BREAST LUMPECTOMY WITH RADIOACTIVE SEED LOCALIZATION  Left 11/18/2021   Procedure: LEFT BREAST LUMPECTOMY WITH RADIOACTIVE SEED LOCALIZATION;  Surgeon: Sim Dryer, MD;  Location: [facility];  Service: General;  Laterality: Left;   BREAST SURGERY  1990   rt br bx   CARDIAC CATHETERIZATION  2011   ablasion   CARDIOVERSION N/A 08/10/2017   Procedure: CARDIOVERSION;  Surgeon: Hugh Madura, MD;  Location: [facility];  Service: Cardiovascular;  Laterality: N/A;   COLONOSCOPY     DILATION AND CURETTAGE OF UTERUS     Left Breast Biopsy  01/15/15   LEFT HEART CATH AND CORONARY ANGIOGRAPHY N/A 05/09/2024   Procedure: LEFT HEART CATH AND CORONARY ANGIOGRAPHY;  Surgeon: Swaziland, Peter M, MD;  Location: [facility] INVASIVE CV LAB;  Service: Cardiovascular;  Laterality: N/A;   PACEMAKER IMPLANT N/A 08/17/2017   Procedure: Pacemaker Implant;  Surgeon: Tammie Fall, MD;  Location: [facility] INVASIVE CV LAB;  Service: Cardiovascular;  Laterality: N/A;   TUBAL LIGATION      Allergies  Allergen Reactions   Adhesive [Tape] Rash and Other (See Comments)    Including EKG electrodes and the like   Codeine Other (See Comments)    Reaction not recalled   Latex Itching   Oxycodone-Aspirin  Nausea And Vomiting   Percodan [Oxycodone-Aspirin ] Nausea And Vomiting   Quinolones     Patient with  ATAA (ascending thoracic aortic aneurysm)     Objective:  Physical Exam General: Alert and oriented x3 in no acute distress  Dermatology: After debridement of the hyperkeratotic tissue the ulcers to the right fifth toe and left first MTP are healed.  No open wounds noted  Vascular: Palpable pedal pulses bilaterally. No edema or erythema noted. Capillary refill within normal limits.  Neurological: Grossly intact via light touch  Musculoskeletal Exam: Hallux valgus deformity noted bilateral left greater than the right  Assessment: 1.  Superficial wounds medial aspect of the first MTP left.  Fifth toe right; healed  Plan of Care:  -Patient evaluated - Light  excisional debridement of the callus tissue was performed today using a tissue nipper. -I do believe the patient's pain to the right foot is associated to circulation.  They state that they have an upcoming appointment with a vascular surgeon to have it evaluated -Return to clinic with me PRN  Dot Gazella, DPM [facility]  Dr. Dot Gazella, DPM    [address] [place], [place] [address]                Office [phone]  Fax [phone]

## 2024-05-16 ENCOUNTER — Other Ambulatory Visit: Payer: Self-pay | Admitting: Internal Medicine

## 2024-05-16 DIAGNOSIS — I480 Paroxysmal atrial fibrillation: Secondary | ICD-10-CM

## 2024-05-16 NOTE — Telephone Encounter (Signed)
 Prescription refill request for Eliquis  received. Indication: AF Last office visit: 04/03/24  Alesia Husky MD Scr: 1.00 on 05/10/24 Age: 89 Weight: 73.4kg  Based on above findings Eliquis  5mg  twice daily is the appropriate dose.  Refill approved.

## 2024-05-18 DIAGNOSIS — Z79899 Other long term (current) drug therapy: Secondary | ICD-10-CM | POA: Diagnosis not present

## 2024-05-22 DIAGNOSIS — M79605 Pain in left leg: Secondary | ICD-10-CM | POA: Diagnosis not present

## 2024-05-22 DIAGNOSIS — M79604 Pain in right leg: Secondary | ICD-10-CM | POA: Diagnosis not present

## 2024-05-22 DIAGNOSIS — I83893 Varicose veins of bilateral lower extremities with other complications: Secondary | ICD-10-CM | POA: Diagnosis not present

## 2024-05-22 DIAGNOSIS — R600 Localized edema: Secondary | ICD-10-CM | POA: Diagnosis not present

## 2024-05-22 NOTE — Progress Notes (Unsigned)
 Electrophysiology Office Note:   Date:  05/23/2024  ID:  [name], DOB 1933-10-24, MRN [id number]  Primary Cardiologist: None Primary Heart Failure: None Electrophysiologist: Manya Sells, MD       History of Present Illness:   [name] is a 89 y.o. female with h/o AF, SVT, PVC's, HTN, HFpEF, ILD, chronic hypoxic respiratory failure, hypothyroidism seen today for routine electrophysiology followup.   The patient was admitted from 5/25-5/29/25 for costochondritis. She was also found to have decompensated CHF and diuretics were adjusted. Labs were stable at discharge.   Since last being seen in our clinic the patient reports doing better on prednisone . She reports her LE's are much less swollen.  Her husband accompanies her and notes that they just recently saw a vascular and vein specialist.  She denies chest pain, palpitations, dyspnea, PND, orthopnea, nausea, vomiting, dizziness, syncope, edema, weight gain, or early satiety.   Review of systems complete and found to be negative unless listed in HPI.   EP Information / Studies Reviewed:    EKG is not ordered today. EKG from 05/08/24 reviewed which showed VP 71 bpm       PPM Interrogation-  reviewed in detail today,  See PACEART report.  Device History: Medtronic Dual Chamber PPM implanted 08/17/2017 for Uncontrolled atrial arrhyhtmia s/p AV node ablation  Studies:  ECHO 04/13/24 > LVEF 60-65%, LA/ RA severely dilated LHC 05/09/24 > normal LVEDP, moderate coronary ectasia, no obs CAD  Arrhythmia / AAD AF s/p AV Node Ablation  AFL s/p Ablation PVC's intolerant of Flecainide, Multaq  and started on Amiodarone     Risk Assessment/Calculations:    CHA2DS2-VASc Score = 5   This indicates a 7.2% annual risk of stroke. The patient's score is based upon: CHF History: 1 HTN History: 1 Diabetes History: 0 Stroke History: 0 Vascular Disease History: 0 Age Score: 2 Gender Score: 1             Physical Exam:   VS:  BP  110/72   Pulse 72   Ht 5' 7.5 (1.715 m)   Wt 156 lb (70.8 kg)   LMP  (LMP Unknown)   SpO2 96%   BMI 24.07 kg/m    Wt Readings from Last 3 Encounters:  05/23/24 156 lb (70.8 kg)  05/10/24 159 lb 11.2 oz (72.4 kg)  04/23/24 161 lb (73 kg)     GEN: Well nourished, well developed in no acute distress NECK: No JVD; No carotid bruits CARDIAC: Regular rate and rhythm (VP), no murmurs, rubs, gallops RESPIRATORY:  Clear to auscultation without rales, wheezing or rhonchi  ABDOMEN: Soft, non-tender, non-distended EXTREMITIES:  No edema; No deformity   ASSESSMENT AND PLAN:    Uncontrolled atrial arrhyhtmia s/p AV node ablation s/p Medtronic PPM  Hx AV Node Ablation  Ventricular escape in 12/2023 at 30bpm, no R waves at 40 bpm 05/23/24 -Normal PPM function -See [facility] report -No changes today  Chronic Atrial Fibrillation  CHA2DS2-VASc 5, s/p AV node ablation  -OAC for stroke prophylaxis  Secondary Hypercoagulable State  -continue Eliquis  5mg  BID, dose reviewed and appropriate by wt / cr  Chronic Diastolic CHF  -euvolemic on exam  -pt reports she had follow up labs with PCP > can see a Mg+.  Will add BMP to follow up renal function on diuretics.  -she reports she weighs daily > stable wt at home 156 lbs  PVC's  -previously on Amiodarone  > seen by Pulmonary, 02/01/24 PFT's with moderately severe restrictive  defect > now off amiodarone    Disposition:   Follow up with Dr. Carolynne Citron in 12 months  Signed, Creighton Doffing, NP-C, AGACNP-BC [facility] - Electrophysiology  05/23/2024, 5:13 PM

## 2024-05-23 ENCOUNTER — Encounter: Payer: Self-pay | Admitting: Pulmonary Disease

## 2024-05-23 ENCOUNTER — Ambulatory Visit: Attending: Pulmonary Disease | Admitting: Pulmonary Disease

## 2024-05-23 ENCOUNTER — Other Ambulatory Visit: Payer: Self-pay | Admitting: Pulmonary Disease

## 2024-05-23 VITALS — BP 110/72 | HR 72 | Ht 67.5 in | Wt 156.0 lb

## 2024-05-23 DIAGNOSIS — I493 Ventricular premature depolarization: Secondary | ICD-10-CM

## 2024-05-23 DIAGNOSIS — D6869 Other thrombophilia: Secondary | ICD-10-CM | POA: Diagnosis not present

## 2024-05-23 DIAGNOSIS — Z950 Presence of cardiac pacemaker: Secondary | ICD-10-CM | POA: Diagnosis not present

## 2024-05-23 DIAGNOSIS — I4821 Permanent atrial fibrillation: Secondary | ICD-10-CM

## 2024-05-23 LAB — CUP PACEART INCLINIC DEVICE CHECK
Battery Remaining Longevity: 14 mo
Battery Voltage: 2.84 V
Brady Statistic AP VP Percent: 0 %
Brady Statistic AP VS Percent: 0 %
Brady Statistic AS VP Percent: 98.44 %
Brady Statistic AS VS Percent: 1.56 %
Brady Statistic RA Percent Paced: 0 %
Brady Statistic RV Percent Paced: 98.44 %
Date Time Interrogation Session: 20250611172349
Implantable Lead Connection Status: 753985
Implantable Lead Connection Status: 753985
Implantable Lead Implant Date: 20180905
Implantable Lead Implant Date: 20180905
Implantable Lead Location: 753859
Implantable Lead Location: 753860
Implantable Lead Model: 3830
Implantable Lead Model: 5076
Implantable Pulse Generator Implant Date: 20180905
Lead Channel Impedance Value: 285 Ohm
Lead Channel Impedance Value: 342 Ohm
Lead Channel Impedance Value: 380 Ohm
Lead Channel Impedance Value: 532 Ohm
Lead Channel Pacing Threshold Amplitude: 0.5 V
Lead Channel Pacing Threshold Pulse Width: 0.4 ms
Lead Channel Sensing Intrinsic Amplitude: 1.125 mV
Lead Channel Sensing Intrinsic Amplitude: 1.125 mV
Lead Channel Sensing Intrinsic Amplitude: 2.875 mV
Lead Channel Sensing Intrinsic Amplitude: 3.125 mV
Lead Channel Setting Pacing Amplitude: 2.5 V
Lead Channel Setting Pacing Pulse Width: 1 ms
Lead Channel Setting Sensing Sensitivity: 0.9 mV
Zone Setting Status: 755011

## 2024-05-23 NOTE — Patient Instructions (Signed)
 Medication Instructions:  Your physician recommends that you continue on your current medications as directed. Please refer to the Current Medication list given to you today.  *If you need a refill on your cardiac medications before your next appointment, please call your pharmacy*  Lab Work: None ordered If you have labs (blood work) drawn today and your tests are completely normal, you will receive your results only by: MyChart Message (if you have MyChart) OR A paper copy in the mail If you have any lab test that is abnormal or we need to change your treatment, we will call you to review the results.  Follow-Up: At [facility], you and your health needs are our priority.  As part of our continuing mission to provide you with exceptional heart care, our providers are all part of one team.  This team includes your primary Cardiologist (physician) and Advanced Practice Providers or APPs (Physician Assistants and Nurse Practitioners) who all work together to provide you with the care you need, when you need it.  Your next appointment:   1 year(s)  Provider:   You will see one of the following Advanced Practice Providers on your designated Care Team:   Mertha Abrahams, Kennard Pea [address] [place], PA-C Suzann Riddle, NP Creighton Doffing, NP

## 2024-05-25 DIAGNOSIS — I4821 Permanent atrial fibrillation: Secondary | ICD-10-CM | POA: Diagnosis not present

## 2024-05-25 DIAGNOSIS — Z950 Presence of cardiac pacemaker: Secondary | ICD-10-CM | POA: Diagnosis not present

## 2024-05-26 LAB — BASIC METABOLIC PANEL WITH GFR
BUN/Creatinine Ratio: 36 — ABNORMAL HIGH (ref 12–28)
BUN: 38 mg/dL — ABNORMAL HIGH (ref 8–27)
CO2: 21 mmol/L (ref 20–29)
Calcium: 9.1 mg/dL (ref 8.7–10.3)
Chloride: 104 mmol/L (ref 96–106)
Creatinine, Ser: 1.06 mg/dL — ABNORMAL HIGH (ref 0.57–1.00)
Glucose: 128 mg/dL — ABNORMAL HIGH (ref 70–99)
Potassium: 4.4 mmol/L (ref 3.5–5.2)
Sodium: 143 mmol/L (ref 134–144)
eGFR: 50 mL/min/1.73 — ABNORMAL LOW

## 2024-05-27 ENCOUNTER — Ambulatory Visit: Payer: Self-pay | Admitting: Pulmonary Disease

## 2024-05-28 DIAGNOSIS — M75121 Complete rotator cuff tear or rupture of right shoulder, not specified as traumatic: Secondary | ICD-10-CM | POA: Diagnosis not present

## 2024-05-31 DIAGNOSIS — L950 Livedoid vasculitis: Secondary | ICD-10-CM | POA: Diagnosis not present

## 2024-05-31 DIAGNOSIS — L97811 Non-pressure chronic ulcer of other part of right lower leg limited to breakdown of skin: Secondary | ICD-10-CM | POA: Diagnosis not present

## 2024-05-31 DIAGNOSIS — Z85828 Personal history of other malignant neoplasm of skin: Secondary | ICD-10-CM | POA: Diagnosis not present

## 2024-06-04 ENCOUNTER — Other Ambulatory Visit: Payer: Self-pay | Admitting: Internal Medicine

## 2024-06-04 ENCOUNTER — Other Ambulatory Visit: Payer: Self-pay

## 2024-06-04 DIAGNOSIS — N3946 Mixed incontinence: Secondary | ICD-10-CM

## 2024-06-04 DIAGNOSIS — R351 Nocturia: Secondary | ICD-10-CM

## 2024-06-04 MED ORDER — GEMTESA 75 MG PO TABS: 75.0000 mg | ORAL_TABLET | Freq: Every day | ORAL | 2 refills | Status: DC

## 2024-06-11 DIAGNOSIS — I10 Essential (primary) hypertension: Secondary | ICD-10-CM | POA: Diagnosis not present

## 2024-06-11 DIAGNOSIS — E039 Hypothyroidism, unspecified: Secondary | ICD-10-CM | POA: Diagnosis not present

## 2024-06-11 DIAGNOSIS — I4891 Unspecified atrial fibrillation: Secondary | ICD-10-CM | POA: Diagnosis not present

## 2024-06-11 DIAGNOSIS — I5030 Unspecified diastolic (congestive) heart failure: Secondary | ICD-10-CM | POA: Diagnosis not present

## 2024-06-13 ENCOUNTER — Other Ambulatory Visit: Payer: Self-pay | Admitting: Internal Medicine

## 2024-06-20 ENCOUNTER — Other Ambulatory Visit: Payer: Self-pay | Admitting: Thoracic Surgery (Cardiothoracic Vascular Surgery)

## 2024-06-20 DIAGNOSIS — I7121 Aneurysm of the ascending aorta, without rupture: Secondary | ICD-10-CM

## 2024-06-21 ENCOUNTER — Inpatient Hospital Stay: Payer: Medicare Other | Attending: Adult Health | Admitting: Adult Health

## 2024-06-21 ENCOUNTER — Encounter: Payer: Self-pay | Admitting: Adult Health

## 2024-06-21 VITALS — BP 139/72 | HR 77 | Temp 97.6°F | Resp 16 | Ht 67.5 in | Wt 159.0 lb

## 2024-06-21 DIAGNOSIS — Z853 Personal history of malignant neoplasm of breast: Secondary | ICD-10-CM | POA: Insufficient documentation

## 2024-06-21 DIAGNOSIS — R5383 Other fatigue: Secondary | ICD-10-CM | POA: Diagnosis not present

## 2024-06-21 DIAGNOSIS — Z170 Estrogen receptor positive status [ER+]: Secondary | ICD-10-CM

## 2024-06-21 DIAGNOSIS — C50512 Malignant neoplasm of lower-outer quadrant of left female breast: Secondary | ICD-10-CM | POA: Diagnosis not present

## 2024-06-21 DIAGNOSIS — Z803 Family history of malignant neoplasm of breast: Secondary | ICD-10-CM | POA: Diagnosis not present

## 2024-06-21 DIAGNOSIS — Z808 Family history of malignant neoplasm of other organs or systems: Secondary | ICD-10-CM | POA: Insufficient documentation

## 2024-06-21 NOTE — Progress Notes (Signed)
 [facility] Cancer Follow up:    Tracy Trula SQUIBB, MD [address] [place] [address]   DIAGNOSIS: Cancer Staging  Breast cancer of lower-outer quadrant of left female breast [facility]) Staging form: Breast, AJCC 7th Edition - Pathologic stage from 02/14/2015: Stage IA (T1c, N0, cM0) - Unsigned - Clinical: Stage IA (T1c, N0, M0) - Unsigned    SUMMARY OF ONCOLOGIC HISTORY: Oncology History  Breast cancer of lower-outer quadrant of left female breast (HCC)  01/15/2015 Initial Biopsy   Left breast biopsy: Invasive lobular cancer, grade 2, ER+ (94%), PR+ (29%),  HER-2 negative, Ki-67 19%.   01/21/2015 Breast MRI   Left breast:: 1.7 x 1.6 x 1.4 cm irregular enhancing mass lower outer quadrant, no abnormal lymph nodes   01/21/2015 Clinical Stage   Stage IA: TI cN0 M0.   02/14/2015 Surgery   Left breast lumpectomy ([facility]): Invasive lobular cancer, negative for LV I, tumor size 01.7 cm, margins negative, HER-2 was repeated and it was negative   02/14/2015 Pathologic Stage   Stage IA: T1c N0   02/26/2015 Miscellaneous   Pt opted for no RT.   03/12/2015 Procedure   Genetic testing: OvaNext gene panel  ([employer] [employer]) revealed VUS at ATM. Otherwise negative BARD1, BRCA1, BRCA2, BRIP1, CDH1, CHEK2, EPCAM, MLH1, MRE11A, MSH2, MSH6, MUTYH, NBN, NF1, PALB2, PMS2, PTEN, RAD50, RAD51C, RAD51D, SMARCA4, STK11, TP53.   04/29/2015 - 02/09/2016 Anti-estrogen oral therapy   Anastrozole  1 mg daily changed to letrozole  2.5 mg daily 09/19/2015 due to dry mouth and loss of taste, stopped due to continuation of same side effects   08/22/2015 Survivorship   A copy of the survivorship care plan was mailed to the patient in lieu of an in-person visit at her request.   10/21/2021 Relapse/Recurrence   Screening mammogram detected left breast abnormality, biopsy revealed grade 2 invasive lobular cancer ER 100%, PR less than 1%, HER2 equivocal FISH pending, Ki-67 10%   11/18/2021 Surgery    11/18/21: Breast conserving surgery: Grade 2 ILC 1.4 cm,  ER 100%, PR less than 1%, HER2 equivocal FISH Neg, Ki-67 10%     CURRENT THERAPY: observation  INTERVAL HISTORY:  Discussed the use of AI scribe software for clinical note transcription with the patient, who gave verbal consent to proceed.  History of Present Illness The patient, with a history of stage 1A left breast cancer diagnosed in 2016 and a recurrence in 2022, presents for an annual follow-up. The patient opted for lumpectomy only and was unable to tolerate anti-estrogen therapy. The patient has been experiencing fatigue and decreased appetite, which has led to hospitalizations for lung issues. The patient's husband reports that she has been eating less and less. The patient also reports having issues with her leg, but these are not related to her breast cancer. The patient has been experiencing chest discomfort and was recently diagnosed with costochondritis. The patient also reports having shoulder issues.    Patient Active Problem List   Diagnosis Date Noted   UTI (urinary tract infection) 05/09/2024   Acute costochondritis 05/06/2024   Acute on chronic diastolic CHF (congestive heart failure) (HCC) 05/06/2024   Chronic respiratory failure with hypoxia (HCC) 05/01/2024   Mixed stress and urge urinary incontinence 09/29/2023   Nocturia 09/29/2023   Abnormal finding on urinalysis 09/29/2023   Vaginal atrophy 09/29/2023   Chronic diastolic heart failure (HCC) 03/27/2021   PVC's (premature ventricular contractions) 09/23/2020   Pacemaker 01/10/2020   Pain in left knee  09/27/2018   ILD (interstitial lung disease) (HCC) 02/21/2017   Dyspnea and respiratory abnormality 02/11/2017   Abnormality of gait and mobility 01/19/2017   Hypothyroidism 01/19/2017   Other specified disorders of bone density and structure, unspecified site 01/19/2017   Genetic testing 03/13/2015   Paresthesia of skin 03/03/2015   Breast cancer of  lower-outer quadrant of left female breast (HCC) 02/06/2015   Hematuria    PAF (paroxysmal atrial fibrillation) (HCC)    Chronic venous insufficiency    SVT (supraventricular tachycardia) (HCC) 06/12/2010   Essential hypertension 04/22/2010   MITRAL VALVE PROLAPSE 04/22/2010    is allergic to adhesive [tape], codeine, latex, oxycodone-aspirin , percodan [oxycodone-aspirin ], and quinolones.  MEDICAL HISTORY: Past Medical History:  Diagnosis Date   Allergic rhinitis    Arthritis    bil knees   Atypical atrial flutter (HCC) 04/22/2010   Qualifier: Diagnosis of   By: Lawernce CMA, Jewel         Breast cancer, left breast (HCC) 01/15/2015   Invasive Mammary   Chronic venous insufficiency    Hematuria    negative workup - Dr. Amiel   Hypertension    Hypothyroidism    Migraine headache    MVP (mitral valve prolapse)    OAB (overactive bladder)    Pacemaker    PAF (paroxysmal atrial fibrillation) (HCC)    SVT (supraventricular tachycardia) (HCC) 06/2010   s/p AVNRT ablation   Wears glasses     SURGICAL HISTORY: Past Surgical History:  Procedure Laterality Date   A-FLUTTER ABLATION N/A 02/04/2017   Procedure: A-Flutter Ablation;  Surgeon: Danelle LELON Birmingham, MD;  Location: [facility] INVASIVE CV LAB;  Service: Cardiovascular;  Laterality: N/A;   ABDOMINAL HYSTERECTOMY     AV NODE ABLATION N/A 08/17/2017   Procedure: AV Node Ablation;  Surgeon: Birmingham Danelle LELON, MD;  Location: [facility] INVASIVE CV LAB;  Service: Cardiovascular;  Laterality: N/A;   BREAST LUMPECTOMY WITH RADIOACTIVE SEED LOCALIZATION Left 02/14/2015   Procedure: BREAST LUMPECTOMY WITH RADIOACTIVE SEED LOCALIZATION;  Surgeon: Alm Angle, MD;  Location: [facility];  Service: General;  Laterality: Left;   BREAST LUMPECTOMY WITH RADIOACTIVE SEED LOCALIZATION Left 11/18/2021   Procedure: LEFT BREAST LUMPECTOMY WITH RADIOACTIVE SEED LOCALIZATION;  Surgeon: Vanderbilt Ned, MD;  Location: [facility];  Service:  General;  Laterality: Left;   BREAST SURGERY  1990   rt br bx   CARDIAC CATHETERIZATION  2011   ablasion   CARDIOVERSION N/A 08/10/2017   Procedure: CARDIOVERSION;  Surgeon: Jeffrie Oneil BROCKS, MD;  Location: [facility] ENDOSCOPY;  Service: Cardiovascular;  Laterality: N/A;   COLONOSCOPY     DILATION AND CURETTAGE OF UTERUS     Left Breast Biopsy  01/15/15   LEFT HEART CATH AND CORONARY ANGIOGRAPHY N/A 05/09/2024   Procedure: LEFT HEART CATH AND CORONARY ANGIOGRAPHY;  Surgeon: Swaziland, Peter M, MD;  Location: [facility] INVASIVE CV LAB;  Service: Cardiovascular;  Laterality: N/A;   PACEMAKER IMPLANT N/A 08/17/2017   Procedure: Pacemaker Implant;  Surgeon: Birmingham Danelle LELON, MD;  Location: [facility] INVASIVE CV LAB;  Service: Cardiovascular;  Laterality: N/A;   TUBAL LIGATION      SOCIAL HISTORY: Social History   Socioeconomic History   Marital status: Married    Spouse name: Not on file   Number of children: 3   Years of education: Not on file   Highest education level: Not on file  Occupational History   Not on file  Tobacco Use   Smoking status: Never  Smokeless tobacco: Never   Tobacco comments:    did smoke about 2cigs long time about 54 plus years ago  Vaping Use   Vaping status: Never Used  Substance and Sexual Activity   Alcohol  use: Yes    Comment: glass of wine nightly   Drug use: No   Sexual activity: Not Currently    Birth control/protection: Surgical  Other Topics Concern   Not on file  Social History Narrative   LIves with husband.    Retired.    Social Drivers of Corporate investment banker Strain: Not on file  Food Insecurity: No Food Insecurity (05/06/2024)   Hunger Vital Sign    Worried About Running Out of Food in the Last Year: Never true    Ran Out of Food in the Last Year: Never true  Transportation Needs: No Transportation Needs (05/06/2024)   PRAPARE - Administrator, Civil Service (Medical): No    Lack of Transportation (Non-Medical): No  Physical Activity: Not  on file  Stress: Not on file  Social Connections: Patient Declined (05/06/2024)   Social Connection and Isolation Panel    Frequency of Communication with Friends and Family: Patient declined    Frequency of Social Gatherings with Friends and Family: Patient declined    Attends Religious Services: Patient declined    Database administrator or Organizations: Patient declined    Attends Banker Meetings: Patient declined    Marital Status: Patient declined  Intimate Partner Violence: Not At Risk (05/06/2024)   Humiliation, Afraid, Rape, and Kick questionnaire    Fear of Current or Ex-Partner: No    Emotionally Abused: No    Physically Abused: No    Sexually Abused: No    FAMILY HISTORY: Family History  Problem Relation Age of Onset   Hypertension Mother    Heart disease Mother    Arthritis Mother    Other Father        hardening of the arteries   Heart Problems Sister        pacemaker   Heart attack Brother    Throat cancer Paternal Uncle    Breast cancer Sister        early 73s   Alzheimer's disease Sister    Pulmonary disease Sister    Breast cancer Daughter 19       Negative genetic testing    Breast cancer Paternal Aunt        dx over 15   Cancer Paternal Aunt        cancer in her leg   Breast cancer Cousin        multiple paternal cousin    Review of Systems  Constitutional:  Negative for appetite change, chills, fatigue, fever and unexpected weight change.  HENT:   Negative for hearing loss, lump/mass and trouble swallowing.   Eyes:  Negative for eye problems and icterus.  Respiratory:  Negative for chest tightness, cough and shortness of breath.   Cardiovascular:  Negative for chest pain, leg swelling and palpitations.  Gastrointestinal:  Negative for abdominal distention, abdominal pain, constipation, diarrhea, nausea and vomiting.  Endocrine: Negative for hot flashes.  Genitourinary:  Negative for difficulty urinating.   Musculoskeletal:   Negative for arthralgias.  Skin:  Negative for itching and rash.  Neurological:  Negative for dizziness, extremity weakness, headaches and numbness.  Hematological:  Negative for adenopathy. Does not bruise/bleed easily.  Psychiatric/Behavioral:  Negative for depression. The patient is not nervous/anxious.  PHYSICAL EXAMINATION    Vitals:   06/21/24 1158  BP: 139/72  Pulse: 77  Resp: 16  Temp: 97.6 F (36.4 C)  SpO2: 96%    Physical Exam Constitutional:      General: She is not in acute distress.    Appearance: Normal appearance. She is not toxic-appearing.  HENT:     Head: Normocephalic and atraumatic.     Mouth/Throat:     Mouth: Mucous membranes are moist.     Pharynx: Oropharynx is clear. No oropharyngeal exudate or posterior oropharyngeal erythema.  Eyes:     General: No scleral icterus. Cardiovascular:     Rate and Rhythm: Normal rate and regular rhythm.     Pulses: Normal pulses.     Heart sounds: Normal heart sounds.  Pulmonary:     Effort: Pulmonary effort is normal.     Breath sounds: Normal breath sounds.  Chest:     Comments: Right breast s/p lumpectomy, no sign of local recurrence, left breast benign Abdominal:     General: Abdomen is flat. Bowel sounds are normal. There is no distension.     Palpations: Abdomen is soft.     Tenderness: There is no abdominal tenderness.  Musculoskeletal:        General: No swelling.     Cervical back: Neck supple.  Lymphadenopathy:     Cervical: No cervical adenopathy.     Upper Body:     Right upper body: No supraclavicular or axillary adenopathy.     Left upper body: No supraclavicular or axillary adenopathy.  Skin:    General: Skin is warm and dry.     Findings: No rash.  Neurological:     General: No focal deficit present.     Mental Status: She is alert.  Psychiatric:        Mood and Affect: Mood normal.        Behavior: Behavior normal.     LABORATORY DATA:  CBC    Component Value Date/Time    WBC 6.7 05/09/2024 0339   RBC 3.95 05/09/2024 0339   HGB 12.1 05/09/2024 0339   HGB 11.8 04/14/2022 1216   HGB 11.1 (L) 02/17/2015 1054   HCT 37.3 05/09/2024 0339   HCT 34.8 04/14/2022 1216   HCT 34.3 (L) 02/17/2015 1054   PLT 239 05/09/2024 0339   PLT 204 04/14/2022 1216   MCV 94.4 05/09/2024 0339   MCV 86 04/14/2022 1216   MCV 87.6 02/17/2015 1054   MCH 30.6 05/09/2024 0339   MCHC 32.4 05/09/2024 0339   RDW 14.5 05/09/2024 0339   RDW 14.2 04/14/2022 1216   RDW 13.5 02/17/2015 1054   LYMPHSABS 0.9 12/12/2023 0453   LYMPHSABS 1.1 03/27/2021 1443   LYMPHSABS 1.3 02/17/2015 1054   MONOABS 0.6 12/12/2023 0453   MONOABS 0.4 02/17/2015 1054   EOSABS 0.0 12/12/2023 0453   EOSABS 0.1 03/27/2021 1443   BASOSABS 0.0 12/12/2023 0453   BASOSABS 0.0 03/27/2021 1443   BASOSABS 0.0 02/17/2015 1054    CMP     Component Value Date/Time   NA 143 05/25/2024 1508   NA 140 02/17/2015 1054   K 4.4 05/25/2024 1508   K 3.8 02/17/2015 1054   CL 104 05/25/2024 1508   CO2 21 05/25/2024 1508   CO2 25 02/17/2015 1054   GLUCOSE 128 (H) 05/25/2024 1508   GLUCOSE 97 05/10/2024 0446   GLUCOSE 87 02/17/2015 1054   BUN 38 (H) 05/25/2024 1508   BUN 15.8 02/17/2015 1054  CREATININE 1.06 (H) 05/25/2024 1508   CREATININE 0.7 02/17/2015 1054   CALCIUM 9.1 05/25/2024 1508   CALCIUM 8.9 02/17/2015 1054   PROT 6.2 (L) 05/07/2024 0418   PROT 7.6 04/14/2022 1216   PROT 7.4 02/17/2015 1054   ALBUMIN 3.0 (L) 05/07/2024 0418   ALBUMIN 4.1 04/14/2022 1216   ALBUMIN 3.3 (L) 02/17/2015 1054   AST 18 05/07/2024 0418   AST 17 02/17/2015 1054   ALT 10 05/07/2024 0418   ALT 13 02/17/2015 1054   ALKPHOS 72 05/07/2024 0418   ALKPHOS 102 02/17/2015 1054   BILITOT 0.6 05/07/2024 0418   BILITOT 0.5 04/14/2022 1216   BILITOT 0.31 02/17/2015 1054   GFRNONAA 54 (L) 05/10/2024 0446   GFRAA 65 09/01/2017 1054     ASSESSMENT and THERAPY PLAN:   Assessment and Plan Assessment & Plan Breast cancer History  of left breast cancer, stage 1A, with recurrence in 2022 as grade 2 invasive lobular carcinoma, estrogen receptor positive. Currently stable with no new breast changes or concerns. - Continue annual breast exams. - Continue annual mammograms so long as she prefers to continue with these.   - Discuss with primary care provider about performing breast exams if she chooses not to continue mammograms. - Schedule follow-up appointment in one year or sooner if new breast concerns arise.  Fatigue Fatigue possibly related to decreased oral intake. - Encourage nutritional supplementation with Ensure or Boost.  RTC in 1 year for continued long term follow-up.   All questions were answered. The patient knows to call the clinic with any problems, questions or concerns. We can certainly see the patient much sooner if necessary.  Total encounter time:20 minutes*in face-to-face visit time, chart review, lab review, care coordination, order entry, and documentation of the encounter time.    Tracy Kendall, NP 06/21/24 12:18 PM Medical Oncology and Hematology [facility] [address] [place], [place] [address] Tel. [phone]    Fax. [phone]  *Total Encounter Time as defined by the Centers for Medicare and Medicaid Services includes, in addition to the face-to-face time of a patient visit (documented in the note above) non-face-to-face time: obtaining and reviewing outside history, ordering and reviewing medications, tests or procedures, care coordination (communications with other health care professionals or caregivers) and documentation in the medical record.

## 2024-06-22 ENCOUNTER — Encounter: Payer: Self-pay | Admitting: Physician Assistant

## 2024-06-22 DIAGNOSIS — I8312 Varicose veins of left lower extremity with inflammation: Secondary | ICD-10-CM | POA: Diagnosis not present

## 2024-06-22 DIAGNOSIS — R600 Localized edema: Secondary | ICD-10-CM | POA: Diagnosis not present

## 2024-06-22 DIAGNOSIS — I8311 Varicose veins of right lower extremity with inflammation: Secondary | ICD-10-CM | POA: Diagnosis not present

## 2024-06-22 DIAGNOSIS — L299 Pruritus, unspecified: Secondary | ICD-10-CM | POA: Diagnosis not present

## 2024-06-28 ENCOUNTER — Ambulatory Visit: Admitting: Obstetrics

## 2024-06-28 ENCOUNTER — Encounter: Payer: Self-pay | Admitting: Obstetrics

## 2024-06-28 VITALS — BP 125/55 | HR 77

## 2024-06-28 DIAGNOSIS — N3946 Mixed incontinence: Secondary | ICD-10-CM | POA: Diagnosis not present

## 2024-06-28 DIAGNOSIS — N952 Postmenopausal atrophic vaginitis: Secondary | ICD-10-CM

## 2024-06-28 DIAGNOSIS — R351 Nocturia: Secondary | ICD-10-CM

## 2024-06-28 MED ORDER — GEMTESA 75 MG PO TABS
75.0000 mg | ORAL_TABLET | Freq: Every day | ORAL | 11 refills | Status: AC
Start: 2024-06-28 — End: ?

## 2024-06-28 NOTE — Assessment & Plan Note (Signed)
-   previously avoided vaginal estrogen due to recurrent ER+ breast cancer - reviewed vulvovaginal moisturizer Options: - Vitamin E oil (pump or capsule) or cream (Gene's Vit E Cream) - vaginal moisturizers  - concerns with UTI and cognitive impact, prior use of low dose vaginal estrogen to reduce UTI. Pt self discontinued. Urine culture < 10K CFU growth 05/06/24 during hospitalization for CHF exacerbation.  - encouraged to resume low dose vaginal estrogen due to reduction of rUTI, pt declines unless clinical change

## 2024-06-28 NOTE — Progress Notes (Signed)
 [facility] Return Visit  SUBJECTIVE  History of Present Illness: [name] is a 89 y.o. female seen in follow-up for mixed urinary incontinence, nocturia, and vaginal atrophy. Plan at last visit was continue gemtesa  and vaginal estrogen.   Recent hospitalization 05/06/24 for chest pain, acute on chronic diastolic CHF with pleural effusion, PAF, ILD, and UTI. Underwent cardiac catheterization on 05/09/24 with  coronary ectasia and no obstruction CAD. Presumed UTI treated with ceftriaxone  due to UA +leuk/heme, culture < 10K CFU growth. Resumed metoprolol  and anticoagulation with Apixaban   Continue gemtesa  with relief. Voids 2x/night but patient denies bother and reports fluctuations of symptoms. Difficulty to obtain history  Voids 4-5x/day, UUI 1x/day Reports SUI with position changes around 4x/day  Stopped vaginal estrogen due to lack of change in symptoms per pt Most bothered by plantar fascitis which wakes her up at night and reduced appetite   Previously stopped Gemtesa  due to continual confusion with no change after discontinuation. Denies UTI symptoms such as dysuria, hematuria, back pain or N/V.  Baseline urinary symptoms and prior treatments: - voids 1-4x/night - failed Ditropan  XL 10mg  switched to Vesicare due to dry mouth. Trospium  caused dry mouth. Mirabegron  50mg  with relief but cost prohibitive ($115 for 3 month supply)  - used 2 pads/day - Leaks 10x/day   - Prior bladder diary with voids every 3 hours   Prior patient at [facility] managed by Dr. Annette, prior cystoscopy with few flakes in 12/10/21. Last seen 06/08/23  Recurrent left breast ER/PR positive, HER2 negative invasive lobular carcinoma s/p lumpectomy at [facility] in 2023   Past Medical History: Patient  has a past medical history of Allergic rhinitis, Arthritis, Atypical atrial flutter (HCC) (04/22/2010), Breast cancer, left breast (HCC) (01/15/2015), Chronic venous insufficiency, Hematuria,  Hypertension, Hypothyroidism, Migraine headache, MVP (mitral valve prolapse), OAB (overactive bladder), Pacemaker, PAF (paroxysmal atrial fibrillation) (HCC), SVT (supraventricular tachycardia) (HCC) (06/2010), and Wears glasses.   Past Surgical History: She  has a past surgical history that includes Left Breast Biopsy (01/15/15); Breast surgery (1990); Abdominal hysterectomy; Tubal ligation; Dilation and curettage of uterus; Colonoscopy; Cardiac catheterization (2011); Breast lumpectomy with radioactive seed localization (Left, 02/14/2015); A-FLUTTER ABLATION (N/A, 02/04/2017); Cardioversion (N/A, 08/10/2017); PACEMAKER IMPLANT (N/A, 08/17/2017); AV NODE ABLATION (N/A, 08/17/2017); Breast lumpectomy with radioactive seed localization (Left, 11/18/2021); and LEFT HEART CATH AND CORONARY ANGIOGRAPHY (N/A, 05/09/2024).   Medications: She has a current medication list which includes the following prescription(s): acetaminophen , eliquis , budesonide -formoterol , polyethyl glycol-propyl glycol, clobetasol cream, diclofenac  sodium, estradiol , furosemide , levothyroxine , losartan , metoprolol  succinate, mupirocin ointment, pentoxifylline , aerochamber plus, spironolactone , triamcinolone  cream, gemtesa , and vitamin d (ergocalciferol).   Allergies: Patient is allergic to adhesive [tape], codeine, latex, oxycodone-aspirin , percodan [oxycodone-aspirin ], and quinolones.   Social History: Patient  reports that she has never smoked. She has never used smokeless tobacco. She reports current alcohol  use. She reports that she does not use drugs.     OBJECTIVE     Physical Exam: Vitals:   06/28/24 0908  BP: (!) 125/55  Pulse: 77   Gen: No apparent distress, A&O x 3.  Detailed Urogynecologic Evaluation:  Deferred.   ASSESSMENT AND PLAN    Ms. Vandall is a 89 y.o. with:  1. Vaginal atrophy   2. Mixed stress and urge urinary incontinence   3. Nocturia      Vaginal atrophy Assessment & Plan: - previously avoided  vaginal estrogen due to recurrent ER+ breast cancer - reviewed vulvovaginal moisturizer Options: - Vitamin E oil (pump or capsule) or  cream (Gene's Vit E Cream) - vaginal moisturizers  - concerns with UTI and cognitive impact, prior use of low dose vaginal estrogen to reduce UTI. Pt self discontinued. Urine culture < 10K CFU growth 05/06/24 during hospitalization for CHF exacerbation.  - encouraged to resume low dose vaginal estrogen due to reduction of rUTI, pt declines unless clinical change   Mixed stress and urge urinary incontinence Assessment & Plan: - We previously discussed the symptoms of overactive bladder (OAB), which include urinary urgency, urinary frequency, nocturia, with or without urge incontinence.  While we do not know the exact etiology of OAB, several treatment options exist. We discussed management including behavioral therapy (decreasing bladder irritants, urge suppression strategies, timed voids, bladder retraining), physical therapy, medication; for refractory cases posterior tibial nerve stimulation, sacral neuromodulation, and intravesical botulinum toxin injection.  For anticholinergic medications, we discussed the potential side effects of anticholinergics including dry eyes, dry mouth, constipation, cognitive impairment and urinary retention. For Beta-3 agonist medication, we discussed the potential side effect of elevated blood pressure which is more likely to occur in individuals with uncontrolled hypertension. - mirabegron  with relief however cost prohibitive. Trospium  cost prohibitive.  - pt desires to continue gemtesa  ($100). No change in prior confusion since discontinuation, now resolved and maybe due to UTI. Rx gemtesa  and RTC if clinical changed - reviewed 3rd line therapy, pt declined - discussed residual SUI that may require additional treatment.  - prior postvoid residual WNL, repeat at 20mL by catheterization  Orders: -     Gemtesa ; Take 1 tablet (75 mg  total) by mouth daily.  Dispense: 30 tablet; Refill: 11  Nocturia Assessment & Plan: - most bothersome per patient due to night time leakage, nocturia previously resolved with Gemtesa  and now 2x/night  - declines additional workup or treatment at this time due to overall clinical improvement - avoid fluid intake after 6pm - elevated feet during the day and continue to use compression socks to reduce lower extremity swelling - known history of CHF, hospitalized for acute on chronic diastolic CHF with pleural effusion 05/06/24 - previously encouraged workup for sleep apnea with referral sent 09/26/23 for study study. Previously provided number for patient to call and schedule appt - failed Ditropan  XL 10mg  switched to Vesicare due to dry mouth. Trospium  caused dry mouth. Mirabegron  50mg  with relief but cost prohibitive ($115 for 3 month supply)   Orders: -     Gemtesa ; Take 1 tablet (75 mg total) by mouth daily.  Dispense: 30 tablet; Refill: 11   Time spent: I spent 14 minutes dedicated to the care of this patient on the date of this encounter to include pre-visit review of records, face-to-face time with the patient discussing mixed urinary incontinence, abnormal UA, vaginal atrophy, nocturia, and post visit documentation and ordering medication/ testing.   Lianne ONEIDA Gillis, MD

## 2024-06-28 NOTE — Patient Instructions (Signed)
 Continue Gemtesa  1 tab daily.   Please resume vaginal estrogen if you experience any change in urinary or vaginal symptoms.

## 2024-06-28 NOTE — Assessment & Plan Note (Signed)
-   We previously discussed the symptoms of overactive bladder (OAB), which include urinary urgency, urinary frequency, nocturia, with or without urge incontinence.  While we do not know the exact etiology of OAB, several treatment options exist. We discussed management including behavioral therapy (decreasing bladder irritants, urge suppression strategies, timed voids, bladder retraining), physical therapy, medication; for refractory cases posterior tibial nerve stimulation, sacral neuromodulation, and intravesical botulinum toxin injection.  For anticholinergic medications, we discussed the potential side effects of anticholinergics including dry eyes, dry mouth, constipation, cognitive impairment and urinary retention. For Beta-3 agonist medication, we discussed the potential side effect of elevated blood pressure which is more likely to occur in individuals with uncontrolled hypertension. - mirabegron  with relief however cost prohibitive. Trospium  cost prohibitive.  - pt desires to continue gemtesa  ($100). No change in prior confusion since discontinuation, now resolved and maybe due to UTI. Rx gemtesa  and RTC if clinical changed - reviewed 3rd line therapy, pt declined - discussed residual SUI that may require additional treatment.  - prior postvoid residual WNL, repeat at 20mL by catheterization

## 2024-06-28 NOTE — Assessment & Plan Note (Addendum)
-   most bothersome per patient due to night time leakage, nocturia previously resolved with Gemtesa  and now 2x/night  - declines additional workup or treatment at this time due to overall clinical improvement - avoid fluid intake after 6pm - elevated feet during the day and continue to use compression socks to reduce lower extremity swelling - known history of CHF, hospitalized for acute on chronic diastolic CHF with pleural effusion 05/06/24 - previously encouraged workup for sleep apnea with referral sent 09/26/23 for study study. Previously provided number for patient to call and schedule appt - failed Ditropan  XL 10mg  switched to Vesicare due to dry mouth. Trospium  caused dry mouth. Mirabegron  50mg  with relief but cost prohibitive ($115 for 3 month supply)

## 2024-07-06 ENCOUNTER — Ambulatory Visit: Admitting: Podiatry

## 2024-07-06 ENCOUNTER — Other Ambulatory Visit (HOSPITAL_COMMUNITY): Payer: Self-pay

## 2024-07-06 DIAGNOSIS — I5032 Chronic diastolic (congestive) heart failure: Secondary | ICD-10-CM | POA: Diagnosis not present

## 2024-07-06 DIAGNOSIS — R5383 Other fatigue: Secondary | ICD-10-CM | POA: Diagnosis not present

## 2024-07-11 ENCOUNTER — Ambulatory Visit: Admitting: Podiatry

## 2024-07-11 ENCOUNTER — Encounter: Payer: Self-pay | Admitting: Podiatry

## 2024-07-11 ENCOUNTER — Ambulatory Visit (INDEPENDENT_AMBULATORY_CARE_PROVIDER_SITE_OTHER)

## 2024-07-11 DIAGNOSIS — M722 Plantar fascial fibromatosis: Secondary | ICD-10-CM | POA: Diagnosis not present

## 2024-07-11 MED ORDER — TRIAMCINOLONE ACETONIDE 10 MG/ML IJ SUSP
10.0000 mg | Freq: Once | INTRAMUSCULAR | Status: AC
  Administered 2024-07-11: 10 mg via INTRA_ARTICULAR

## 2024-07-11 NOTE — Progress Notes (Signed)
 Subjective:   Patient ID: [name], female   DOB: 89 y.o.   MRN: [id number]   HPI Patient presents with caregiver with extreme pain in the heel region of both feet stating that it has been hard to walk on and a lesion fourth toe left that is painful   ROS      Objective:  Physical Exam  Neurovascular status reduced but no change from previous with exquisite discomfort in the plantar fascia approximately 1 month duration at the insertion tendon calcaneus bilateral and lesion distal fourth digit left     Assessment:  High risk patient for acute plantar fasciitis bilateral lesion left painful     Plan:  H&P x-rays reviewed sterile prep injected the plantar fascia bilateral at insertion calcaneus 3 mg Dexasone Kenalog  5 mg Xylocaine  and carefully debrided lesion fourth digit left no iatrogenic bleeding reappoint routine care  X-rays indicate moderate decalcification of bone structure consistent with age minimal spur formation no indication stress fracture,

## 2024-07-12 ENCOUNTER — Ambulatory Visit (HOSPITAL_COMMUNITY)
Admission: RE | Admit: 2024-07-12 | Discharge: 2024-07-12 | Disposition: A | Discharge status: Home / Self Care | Source: Ambulatory Visit | Attending: Thoracic Surgery (Cardiothoracic Vascular Surgery) | Admitting: Thoracic Surgery (Cardiothoracic Vascular Surgery)

## 2024-07-12 DIAGNOSIS — I714 Abdominal aortic aneurysm, without rupture, unspecified: Secondary | ICD-10-CM | POA: Diagnosis not present

## 2024-07-12 DIAGNOSIS — I7121 Aneurysm of the ascending aorta, without rupture: Secondary | ICD-10-CM | POA: Insufficient documentation

## 2024-07-12 DIAGNOSIS — E869 Volume depletion, unspecified: Secondary | ICD-10-CM | POA: Diagnosis not present

## 2024-07-12 DIAGNOSIS — I10 Essential (primary) hypertension: Secondary | ICD-10-CM | POA: Diagnosis not present

## 2024-07-12 DIAGNOSIS — I5030 Unspecified diastolic (congestive) heart failure: Secondary | ICD-10-CM | POA: Diagnosis not present

## 2024-07-12 DIAGNOSIS — I4891 Unspecified atrial fibrillation: Secondary | ICD-10-CM | POA: Diagnosis not present

## 2024-07-12 DIAGNOSIS — I700 Atherosclerosis of aorta: Secondary | ICD-10-CM | POA: Diagnosis not present

## 2024-07-12 DIAGNOSIS — E039 Hypothyroidism, unspecified: Secondary | ICD-10-CM | POA: Diagnosis not present

## 2024-07-12 MED ORDER — IOHEXOL 350 MG/ML SOLN
75.0000 mL | Freq: Once | INTRAVENOUS | Status: AC | PRN
  Administered 2024-07-12: 75 mL via INTRAVENOUS

## 2024-07-13 ENCOUNTER — Other Ambulatory Visit (HOSPITAL_COMMUNITY): Payer: Self-pay

## 2024-07-19 ENCOUNTER — Ambulatory Visit (HOSPITAL_COMMUNITY)

## 2024-07-24 NOTE — Progress Notes (Signed)
 [address] [place] [address]             [phone]            [name] [id number] 1933-04-21   History of Present Illness:  [name] is a 89 year old female with medical history of hypertension, mitral prolapse, SVT, paroxysmal atrial fibrillation, chronic diastolic heart failure, interstitial lung disease, hypothyroidism and breast cancer who presents for continued surveillance of ascending thoracic aortic aneurysm.  Aneurysm was initially found on a noncontrast CT scan of chest incidentally which measured aneurysm at 4.7 cm.  She has since received CT angio of chest (04/2024 and 06/2024) which measured aneurysm at 4.2 cm - 4.3 cm.  After reviewing patient chart another CT scan from 2019 showed aneurysm at 4.2 cm.  Echocardiogram from 04/2024 showed a tricuspid aortic valve and measured ascending thoracic aortic aneurysm at 42 mm.  She presents to the clinic today with her husband.  She has been doing well overall.  She was hospitalized in May 2025 for congestive heart failure.  She has been doing well since and has stayed euvolemic.  Her blood pressure is well-controlled with current medication therapy.  Her home readings are in the 120s over 80s.  She denies chest pain, shortness of breath, lower leg swelling.      Current Outpatient Medications on File Prior to Visit  Medication Sig Dispense Refill   acetaminophen  (TYLENOL ) 325 MG tablet Take 2 tablets (650 mg total) by mouth every 6 (six) hours as needed for mild pain (pain score 1-3) (or Fever >/= 101). 20 tablet 0   apixaban  (ELIQUIS ) 5 MG TABS tablet TAKE 1 TABLET BY MOUTH TWICE  DAILY 200 tablet 1   Carboxymethylcellul-Glycerin (LUBRICATING EYE DROPS OP) Place 1 drop into both eyes 3 (three) times daily as needed (for dryness).     clobetasol cream (TEMOVATE) 0.05 % Apply 1 Application topically 2 (two) times daily.     estradiol  (ESTRACE ) 0.1 MG/GM vaginal cream Place 0.5g  nightly for two weeks then twice a week after 30 g 3   furosemide  (LASIX ) 40 MG tablet Take 1 tablet (40 mg total) by mouth daily. (Patient taking differently: Take 40 mg by mouth daily as needed. As needed) 30 tablet 0   levothyroxine  (SYNTHROID ) 125 MCG tablet Take 125 mcg by mouth daily before breakfast.     losartan  (COZAAR ) 50 MG tablet Take 1 tablet (50 mg total) by mouth daily. 90 tablet 3   metoprolol  succinate (TOPROL -XL) 25 MG 24 hr tablet Take 1 tablet (25 mg total) by mouth daily. 90 tablet 3   mirtazapine (REMERON) 7.5 MG tablet Take 7.5 mg by mouth at bedtime.     mupirocin ointment (BACTROBAN) 2 % Apply 1 Application topically See admin instructions. Apply to affected area/ulcer on the right leg once a day     pentoxifylline  (TRENTAL ) 400 MG CR tablet Take 400 mg by mouth daily.     spironolactone  (ALDACTONE ) 25 MG tablet Take 1 tablet (25 mg total) by mouth daily. (Patient taking differently: Take 25 mg by mouth daily. Takes as needed) 30 tablet 0   Vibegron  (GEMTESA ) 75 MG TABS Take 1 tablet (75 mg total) by mouth daily. 30 tablet 11   Vitamin D, Ergocalciferol, (DRISDOL) 50000 units CAPS capsule Take 50,000 Units by mouth every Thursday.     No current facility-administered medications on file prior to visit.  ROS: Review of Systems  Respiratory: Negative.  Negative for cough and shortness of breath.   Cardiovascular: Negative.  Negative for chest pain and leg swelling.     BP 128/82 (BP Location: Left Arm)   Pulse 73   Resp 18   Ht 5' 7 (1.702 m)   Wt 152 lb (68.9 kg)   LMP  (LMP Unknown)   SpO2 91%   BMI 23.81 kg/m   Physical Exam Constitutional:      Appearance: Normal appearance.  HENT:     Head: Normocephalic and atraumatic.  Cardiovascular:     Rate and Rhythm: Normal rate and regular rhythm.     Heart sounds: Normal heart sounds, S1 normal and S2 normal.  Pulmonary:     Effort: Pulmonary effort is normal.     Breath sounds: Normal breath sounds.   Musculoskeletal:     Cervical back: Normal range of motion.  Skin:    General: Skin is warm and dry.  Neurological:     General: No focal deficit present.     Mental Status: She is alert.      Imaging: CLINICAL DATA:  Surveillance ascending thoracic aortic aneurysm   EXAM: CT ANGIOGRAPHY CHEST WITH CONTRAST   TECHNIQUE: Multidetector CT imaging of the chest was performed using the standard protocol during bolus administration of intravenous contrast. Multiplanar CT image reconstructions and MIPs were obtained to evaluate the vascular anatomy.   RADIATION DOSE REDUCTION: This exam was performed according to the departmental dose-optimization program which includes automated exposure control, adjustment of the mA and/or kV according to patient size and/or use of iterative reconstruction technique.   CONTRAST:  75mL OMNIPAQUE  IOHEXOL  350 MG/ML SOLN   COMPARISON:  05/06/2024   FINDINGS: Cardiovascular:  Dual lead pacer noted.   Mild atheromatous vascular calcification of the thoracic aorta. Cardiomegaly observed particularly involving the left atrium and right atrium. The ventricular lead of the pacer device seems to be in the vicinity of the tricuspid valve, correlate with pacer function in determining significance.   Aortic caliber is as follows:   Coronary sinus: 3.6 cm, stable   Proximal ascending aorta 4.0 cm, stable   Mid ascending aorta: 4.3 cm, stable   Proximal arch: 3.2 cm, previously 3.3 cm   Distal arch: 3.1 cm, previously 3.0 cm   Descending thoracic aorta: 2.9 cm, previously same   Hiatus: 3.0 cm, stable.   Mediastinum/Nodes: Precarinal lymph node 1.4 cm in short axis on image 50 series 1, formerly the same.   Lungs/Pleura: Chronic volume loss in the left hemithorax. Scattered scarring and atelectasis in both lungs with patchy regions of nonspecific ground-glass opacity which may be from low-grade alveolitis or previous edema. Small bilateral  pleural effusions, improved from previous.   Upper Abdomen: Upper abdominal aortic aneurysm 3.2 cm in diameter on image 96 series 601.   Musculoskeletal: Thoracic spondylosis. Sclerosis adjacent to the mild superior endplate compression T5, cannot exclude a mild subacute component of the compression although no worsening loss of vertebral height is observed.   Review of the MIP images confirms the above findings.   IMPRESSION: 1. Stable ascending thoracic aortic aneurysm measuring 4.3 cm in diameter. Recommend annual imaging followup by CTA or MRA. This recommendation follows 2010 ACCF/AHA/AATS/ACR/ASA/SCA/SCAI/SIR/STS/SVM Guidelines for the Diagnosis and Management of Patients with Thoracic Aortic Disease. Circulation. 2010; 121: Z733-z630. Aortic aneurysm NOS (ICD10-I71.9) 2. Suprarenal abdominal aortic aneurysm 3.2 cm in diameter. Recommend follow-up abdominal aortic ultrasound every 3 years. This recommendation follows ACR  consensus guidelines: White Paper of the ACR Incidental Findings Committee II on Vascular Findings. J Am Coll Radiol 2013; 10:789-794. 3. Cardiomegaly particularly involving the left atrium and right atrium. The ventricular lead of the pacer device seems to be in the vicinity of the tricuspid valve, correlate with pacer function in determining significance. 4. Small bilateral pleural effusions, improved from previous. 5. Scattered scarring and atelectasis in both lungs with patchy regions of nonspecific ground-glass opacity which may be from low-grade alveolitis or previous edema. 6. Sclerosis adjacent to the mild superior endplate compression of T5, cannot exclude a mild subacute component of the compression although no worsening loss of vertebral height is observed. 7. Thoracic spondylosis. 8.  Aortic Atherosclerosis (ICD10-I70.0).     Electronically Signed   By: Ryan Salvage M.D.   On: 07/12/2024 11:42  Patient Name:   [name] Date of  Exam: 05/07/2024  Medical Rec #:  [id number]      Height:       67.5 in  Accession #:    [id number]     Weight:       163.1 lb  Date of Birth:  [birth year]      BSA:          1.865 m  Patient Age:    89 years       BP:           132/81 mmHg  Patient Gender: F              HR:           71 bpm.  Exam Location:  Inpatient   Procedure: 2D Echo, Cardiac Doppler, Color Doppler and Intracardiac             Opacification Agent (Both Spectral and Color Flow Doppler were             utilized during procedure).   Indications:    R07.9* Chest pain, unspecified    History:        Patient has prior history of Echocardiogram examinations,  most                 recent 05/05/2022. Pacemaker, Arrythmias:Atrial  Fibrillation;                 Risk Factors:Hypertension.    Sonographer:    Damien Senior RDCS  Referring Phys: MARSA KATHEE SCURRY     Sonographer Comments: Technically difficult due to poor echo windows.  IMPRESSIONS     1. Left ventricular ejection fraction, by estimation, is 60 to 65%. The  left ventricle has normal function. The left ventricle has no regional  wall motion abnormalities. Left ventricular diastolic parameters are  indeterminate.   2. Right ventricular systolic function is normal. The right ventricular  size is normal.   3. Left atrial size was severely dilated.   4. Right atrial size was severely dilated.   5. The mitral valve is abnormal. Mild to moderate mitral valve  regurgitation. No evidence of mitral stenosis.   6. The aortic valve is tricuspid. There is mild calcification of the  aortic valve. There is mild thickening of the aortic valve. Aortic valve  regurgitation is trivial. Aortic valve sclerosis is present, with no  evidence of aortic valve stenosis.   7. Aortic dilatation noted. There is moderate dilatation of the ascending  aorta, measuring 42 mm.   8. The inferior vena cava is normal in size with greater than 50%  respiratory variability,  suggesting  right atrial pressure of 3 mmHg.   FINDINGS   Left Ventricle: Left ventricular ejection fraction, by estimation, is 60  to 65%. The left ventricle has normal function. The left ventricle has no  regional wall motion abnormalities. Definity contrast agent was given IV  to delineate the left ventricular   endocardial borders. Strain was performed and the global longitudinal  strain is indeterminate. The left ventricular internal cavity size was  normal in size. There is no left ventricular hypertrophy. Left ventricular  diastolic parameters are  indeterminate.   Right Ventricle: The right ventricular size is normal. No increase in  right ventricular wall thickness. Right ventricular systolic function is  normal.   Left Atrium: Left atrial size was severely dilated.   Right Atrium: Right atrial size was severely dilated.   Pericardium: There is no evidence of pericardial effusion.   Mitral Valve: The mitral valve is abnormal. There is mild thickening of  the mitral valve leaflet(s). There is mild calcification of the mitral  valve leaflet(s). Mild mitral annular calcification. Mild to moderate  mitral valve regurgitation. No evidence  of mitral valve stenosis.   Tricuspid Valve: The tricuspid valve is normal in structure. Tricuspid  valve regurgitation is mild . No evidence of tricuspid stenosis.   Aortic Valve: The aortic valve is tricuspid. There is mild calcification  of the aortic valve. There is mild thickening of the aortic valve. Aortic  valve regurgitation is trivial. Aortic valve sclerosis is present, with no  evidence of aortic valve  stenosis.   Pulmonic Valve: The pulmonic valve was normal in structure. Pulmonic valve  regurgitation is trivial. No evidence of pulmonic stenosis.   Aorta: Aortic dilatation noted. There is moderate dilatation of the  ascending aorta, measuring 42 mm.   Venous: The inferior vena cava is normal in size with greater than 50%   respiratory variability, suggesting right atrial pressure of 3 mmHg.   IAS/Shunts: No atrial level shunt detected by color flow Doppler.   Additional Comments: 3D was performed not requiring image post processing  on an independent workstation and was indeterminate.     LEFT VENTRICLE  PLAX 2D  LVOT diam:     2.20 cm   Diastology  LV SV:         43        LV e' lateral:   3.83 cm/s  LV SV Index:   23        LV E/e' lateral: 37.9  LVOT Area:     3.80 cm     RIGHT VENTRICLE  RV S prime:     10.20 cm/s  TAPSE (M-mode): 1.5 cm   LEFT ATRIUM            Index        RIGHT ATRIUM           Index  LA Vol (A2C): 124.0 ml 66.50 ml/m  RA Area:     29.20 cm  LA Vol (A4C): 120.0 ml 64.35 ml/m  RA Volume:   90.50 ml  48.53 ml/m   AORTIC VALVE  LVOT Vmax:   57.40 cm/s  LVOT Vmean:  40.800 cm/s  LVOT VTI:    0.113 m    AORTA  Ao Root diam: 3.40 cm  Ao Asc diam:  4.20 cm   MITRAL VALVE  MV Area (PHT): 5.70 cm     SHUNTS  MV Decel Time: 133 msec     Systemic VTI:  0.11  m  MV E velocity: 145.00 cm/s  Systemic Diam: 2.20 cm   Maude Emmer MD  Electronically signed by Maude Emmer MD  Signature Date/Time: 05/07/2024/1:20:00 PM    A/P: Aneurysm of ascending aorta without rupture (HCC) -4.2-4.3 cm ascending thoracic aortic aneurysm on CTA of chest. We discussed the natural history and and risk factors for growth of ascending aortic aneurysms. Discussed recommendations to minimize the risk of further expansion or dissection including careful blood pressure control, avoidance of contact sports and heavy lifting, attention to lipid management.  We covered the importance of staying never user of tobacco.  The patient does not yet meet surgical criteria of >5.5cm. The patient is aware of signs and symptoms of aortic dissection and when to present to the emergency department   - Follow-up in 6 months with CTA of chest, if stable in size at 4.2-4.4 will start screening yearly   Risk  Modification:  Statin:  not currently prescribed  Smoking cessation instruction/counseling given:  never user  Patient was counseled on importance of Blood Pressure Control  They are instructed to contact their Primary Care Physician if they start to have blood pressure readings over 130s/90s. Do not ever stop blood pressure medications on your own, unless instructed by healthcare professional.  Please avoid use of Fluoroquinolones as this can potentially increase your risk of Aortic Rupture and/or Dissection  Patient educated on signs and symptoms of Aortic Dissection, handout also provided in AVS  Manuelita CHRISTELLA Rough, PA-C 07/25/24

## 2024-07-24 NOTE — Patient Instructions (Addendum)
 Risk Modification in those with ascending thoracic aortic aneurysm:   Continue control of blood pressure (prefer BP 130/80 or less)   2. Avoid fluoroquinolone antibiotics (I.e Ciprofloxacin, Avelox, Levofloxacin, Ofloxacin)   3.  Use of statin (to decrease cardiovascular risk)   4.  Exercise and activity limitations is individualized, but in general, contact sports are to be avoided and one should avoid heavy lifting (defined as half of ideal body weight) and exercises involving sustained Valsalva maneuver.   5.  Follow-up in 6 months with CT chest.  OK to use a non-contrast CT if you have had a recent study for surveillance of the lung nodule.

## 2024-07-25 ENCOUNTER — Ambulatory Visit

## 2024-07-25 VITALS — BP 128/82 | HR 73 | Resp 18 | Ht 67.0 in | Wt 152.0 lb

## 2024-07-25 DIAGNOSIS — I7121 Aneurysm of the ascending aorta, without rupture: Secondary | ICD-10-CM

## 2024-08-12 DIAGNOSIS — I4891 Unspecified atrial fibrillation: Secondary | ICD-10-CM | POA: Diagnosis not present

## 2024-08-12 DIAGNOSIS — E039 Hypothyroidism, unspecified: Secondary | ICD-10-CM | POA: Diagnosis not present

## 2024-08-12 DIAGNOSIS — I5030 Unspecified diastolic (congestive) heart failure: Secondary | ICD-10-CM | POA: Diagnosis not present

## 2024-08-12 DIAGNOSIS — I10 Essential (primary) hypertension: Secondary | ICD-10-CM | POA: Diagnosis not present

## 2024-08-15 ENCOUNTER — Ambulatory Visit: Admitting: Podiatry

## 2024-08-21 ENCOUNTER — Ambulatory Visit: Payer: Medicare Other

## 2024-08-21 DIAGNOSIS — I4821 Permanent atrial fibrillation: Secondary | ICD-10-CM

## 2024-08-22 LAB — CUP PACEART REMOTE DEVICE CHECK
Battery Remaining Longevity: 11 mo
Battery Voltage: 2.82 V
Brady Statistic AP VP Percent: 0 %
Brady Statistic AP VS Percent: 0 %
Brady Statistic AS VP Percent: 87.45 %
Brady Statistic AS VS Percent: 12.55 %
Brady Statistic RA Percent Paced: 0 %
Brady Statistic RV Percent Paced: 87.45 %
Date Time Interrogation Session: 20250908224232
Implantable Lead Connection Status: 753985
Implantable Lead Connection Status: 753985
Implantable Lead Implant Date: 20180905
Implantable Lead Implant Date: 20180905
Implantable Lead Location: 753859
Implantable Lead Location: 753860
Implantable Lead Model: 3830
Implantable Lead Model: 5076
Implantable Pulse Generator Implant Date: 20180905
Lead Channel Impedance Value: 266 Ohm
Lead Channel Impedance Value: 342 Ohm
Lead Channel Impedance Value: 380 Ohm
Lead Channel Impedance Value: 532 Ohm
Lead Channel Pacing Threshold Amplitude: 0.375 V
Lead Channel Pacing Threshold Pulse Width: 0.4 ms
Lead Channel Sensing Intrinsic Amplitude: 1.125 mV
Lead Channel Sensing Intrinsic Amplitude: 1.125 mV
Lead Channel Sensing Intrinsic Amplitude: 2.875 mV
Lead Channel Sensing Intrinsic Amplitude: 3.125 mV
Lead Channel Setting Pacing Amplitude: 2.5 V
Lead Channel Setting Pacing Pulse Width: 1 ms
Lead Channel Setting Sensing Sensitivity: 0.9 mV
Zone Setting Status: 755011

## 2024-08-27 DIAGNOSIS — L820 Inflamed seborrheic keratosis: Secondary | ICD-10-CM | POA: Diagnosis not present

## 2024-08-27 DIAGNOSIS — L97811 Non-pressure chronic ulcer of other part of right lower leg limited to breakdown of skin: Secondary | ICD-10-CM | POA: Diagnosis not present

## 2024-08-27 DIAGNOSIS — L950 Livedoid vasculitis: Secondary | ICD-10-CM | POA: Diagnosis not present

## 2024-08-27 DIAGNOSIS — Z85828 Personal history of other malignant neoplasm of skin: Secondary | ICD-10-CM | POA: Diagnosis not present

## 2024-08-28 ENCOUNTER — Ambulatory Visit: Payer: Self-pay | Admitting: Internal Medicine

## 2024-08-29 DIAGNOSIS — M7741 Metatarsalgia, right foot: Secondary | ICD-10-CM | POA: Diagnosis not present

## 2024-08-29 DIAGNOSIS — M21612 Bunion of left foot: Secondary | ICD-10-CM | POA: Diagnosis not present

## 2024-08-29 DIAGNOSIS — M19211 Secondary osteoarthritis, right shoulder: Secondary | ICD-10-CM | POA: Diagnosis not present

## 2024-08-29 DIAGNOSIS — M7742 Metatarsalgia, left foot: Secondary | ICD-10-CM | POA: Diagnosis not present

## 2024-08-30 NOTE — Progress Notes (Signed)
 Remote PPM Transmission

## 2024-09-06 ENCOUNTER — Telehealth: Payer: Self-pay

## 2024-09-06 ENCOUNTER — Other Ambulatory Visit: Payer: Self-pay

## 2024-09-06 NOTE — Telephone Encounter (Signed)
 Patient called stating the Gemtesa  isn't working for her at all. She is still getting up 4/5x a night. She want to know what's the next plan for her. Please advise.

## 2024-09-06 NOTE — Telephone Encounter (Signed)
 Patient has an in person appointment for 10/8

## 2024-09-11 DIAGNOSIS — I4891 Unspecified atrial fibrillation: Secondary | ICD-10-CM | POA: Diagnosis not present

## 2024-09-11 DIAGNOSIS — I5030 Unspecified diastolic (congestive) heart failure: Secondary | ICD-10-CM | POA: Diagnosis not present

## 2024-09-11 DIAGNOSIS — I10 Essential (primary) hypertension: Secondary | ICD-10-CM | POA: Diagnosis not present

## 2024-09-11 DIAGNOSIS — E039 Hypothyroidism, unspecified: Secondary | ICD-10-CM | POA: Diagnosis not present

## 2024-09-13 ENCOUNTER — Ambulatory Visit (HOSPITAL_BASED_OUTPATIENT_CLINIC_OR_DEPARTMENT_OTHER): Admitting: Pulmonary Disease

## 2024-09-13 ENCOUNTER — Encounter (HOSPITAL_BASED_OUTPATIENT_CLINIC_OR_DEPARTMENT_OTHER): Payer: Self-pay | Admitting: Pulmonary Disease

## 2024-09-13 ENCOUNTER — Ambulatory Visit (HOSPITAL_BASED_OUTPATIENT_CLINIC_OR_DEPARTMENT_OTHER)

## 2024-09-13 VITALS — BP 137/81 | HR 73 | Ht 67.0 in | Wt 152.0 lb

## 2024-09-13 DIAGNOSIS — Z23 Encounter for immunization: Secondary | ICD-10-CM

## 2024-09-13 DIAGNOSIS — J849 Interstitial pulmonary disease, unspecified: Secondary | ICD-10-CM

## 2024-09-13 LAB — PULMONARY FUNCTION TEST
DL/VA % pred: 90 %
DL/VA: 3.59 ml/min/mmHg/L
DLCO unc % pred: 46 %
DLCO unc: 9.19 ml/min/mmHg
FEF 25-75 Pre: 0.51 L/s
FEF2575-%Pred-Pre: 48 %
FEV1-%Pred-Pre: 47 %
FEV1-Pre: 0.87 L
FEV1FVC-%Pred-Pre: 95 %
FEV6-%Pred-Pre: 54 %
FEV6-Pre: 1.27 L
FEV6FVC-%Pred-Pre: 105 %
FVC-%Pred-Pre: 51 %
FVC-Pre: 1.28 L
Pre FEV1/FVC ratio: 68 %
Pre FEV6/FVC Ratio: 99 %

## 2024-09-13 MED ORDER — OMEPRAZOLE 20 MG PO CPDR: 20.0000 mg | DELAYED_RELEASE_CAPSULE | Freq: Every day | ORAL | 11 refills | Status: AC

## 2024-09-13 NOTE — Patient Instructions (Signed)
 ILD Reviewed PFTs with moderately severe restriction that is stable START omeprazole 20 mg daily for lung protection from silent reflux Off Symbicort . No changes in symbicort  Flu shot today. Plans to get RSV later

## 2024-09-13 NOTE — Progress Notes (Signed)
 SPIRO/DLCO performed today.

## 2024-09-13 NOTE — Patient Instructions (Signed)
 SPIRO/DLCO performed today.

## 2024-09-13 NOTE — Progress Notes (Signed)
 Subjective:   PATIENT ID: [name] GENDER: female DOB: 1933-08-16, MRN: [id number]  Chief Complaint  Patient presents with   Follow-up    Reason for Visit: Follow-up  [name] is a 89 year old female with ILD, paroxysmal atrial fibrillation, mitral valve prolapse, hx DVT, hypothyroidism, hx breast cancer who presents for follow-up  02/01/24 Seen for hospital follow-up. Discharge summary reviewed. She was discharged on 2L on 12/12/23 with prednisone  taper. Previously followed by Dr. Geronimo for NSIP. Was not on oxygen prior to admission. Question of amiodarone  toxicity. Compliant with oxygen and Symbicort . Has felt breathing has been stable. Denies cough or wheezing. Weaned off steroids. Has not been monitoring O2 levels at home.  03/01/24 Since our last visit she was treated for ILD exacerbation with prednisone  taper. She currently is 30 mg daily dosing currently. Breathing has been stable. Able to perform ADLs and cook around the house. Her reflux has been increased but has not been taking her PPI consistently. She is compliant with her oxygen on 5L.  09/13/24 Since our last visit she reports her breathing is overall doing well. She is not active at baseline. [facility] and so didn't have time. Shortness of breath with exertion. Denies wheezing or cough. Stopped omeprazole because she wasn't clear why she was on it and denied reflux. Stopped symbicort  and felt no difference in breathing. Her O2 was returned this summer once her oxygen levels improved. Has been feeling depressed lately but now wanting to become more active again.  Past Medical History:  Diagnosis Date   Allergic rhinitis    Arthritis    bil knees   Atypical atrial flutter (HCC) 04/22/2010   Qualifier: Diagnosis of   By: Lawernce CMA, Jewel         Breast cancer, left breast (HCC) 01/15/2015   Invasive Mammary   Chronic venous insufficiency     Hematuria    negative workup - Dr. Amiel   Hypertension    Hypothyroidism    Migraine headache    MVP (mitral valve prolapse)    OAB (overactive bladder)    Pacemaker    PAF (paroxysmal atrial fibrillation) (HCC)    SVT (supraventricular tachycardia) 06/2010   s/p AVNRT ablation   Wears glasses      Family History  Problem Relation Age of Onset   Hypertension Mother    Heart disease Mother    Arthritis Mother    Other Father        hardening of the arteries   Heart Problems Sister        pacemaker   Heart attack Brother    Throat cancer Paternal Uncle    Breast cancer Sister        early 46s   Alzheimer's disease Sister    Pulmonary disease Sister    Breast cancer Daughter 54       Negative genetic testing    Breast cancer Paternal Aunt        dx over 108   Cancer Paternal Aunt        cancer in her leg   Breast cancer Cousin        multiple paternal cousin     Social History   Occupational History   Not on file  Tobacco Use   Smoking status: Never   Smokeless tobacco: Never   Tobacco comments:    did smoke about 2cigs long time  about 54 plus years ago  Vaping Use   Vaping status: Never Used  Substance and Sexual Activity   Alcohol  use: Yes    Comment: glass of wine nightly   Drug use: No   Sexual activity: Not Currently    Birth control/protection: Surgical    Allergies  Allergen Reactions   Adhesive [Tape] Rash and Other (See Comments)    Including EKG electrodes and the like   Codeine Other (See Comments)    Reaction not recalled   Latex Itching   Oxycodone-Aspirin  Nausea And Vomiting   Percodan [Oxycodone-Aspirin ] Nausea And Vomiting   Quinolones     Patient with ATAA (ascending thoracic aortic aneurysm)     Outpatient Medications Prior to Visit  Medication Sig Dispense Refill   acetaminophen  (TYLENOL ) 325 MG tablet Take 2 tablets (650 mg total) by mouth every 6 (six) hours as needed for mild pain (pain score 1-3) (or Fever >/= 101).  20 tablet 0   apixaban  (ELIQUIS ) 5 MG TABS tablet TAKE 1 TABLET BY MOUTH TWICE  DAILY 200 tablet 1   Carboxymethylcellul-Glycerin (LUBRICATING EYE DROPS OP) Place 1 drop into both eyes 3 (three) times daily as needed (for dryness).     estradiol  (ESTRACE ) 0.1 MG/GM vaginal cream Place 0.5g nightly for two weeks then twice a week after 30 g 3   furosemide  (LASIX ) 40 MG tablet Take 1 tablet (40 mg total) by mouth daily. (Patient taking differently: Take 40 mg by mouth daily as needed. As needed) 30 tablet 0   levothyroxine  (SYNTHROID ) 125 MCG tablet Take 125 mcg by mouth daily before breakfast.     losartan  (COZAAR ) 50 MG tablet Take 1 tablet (50 mg total) by mouth daily. 90 tablet 3   metoprolol  succinate (TOPROL -XL) 25 MG 24 hr tablet Take 1 tablet (25 mg total) by mouth daily. 90 tablet 3   mirtazapine (REMERON) 7.5 MG tablet Take 7.5 mg by mouth at bedtime.     mupirocin ointment (BACTROBAN) 2 % Apply 1 Application topically See admin instructions. Apply to affected area/ulcer on the right leg once a day     pentoxifylline  (TRENTAL ) 400 MG CR tablet Take 400 mg by mouth daily.     Vibegron  (GEMTESA ) 75 MG TABS Take 1 tablet (75 mg total) by mouth daily. 30 tablet 11   Vitamin D, Ergocalciferol, (DRISDOL) 50000 units CAPS capsule Take 50,000 Units by mouth every Thursday.     clobetasol cream (TEMOVATE) 0.05 % Apply 1 Application topically 2 (two) times daily.     spironolactone  (ALDACTONE ) 25 MG tablet Take 1 tablet (25 mg total) by mouth daily. (Patient taking differently: Take 25 mg by mouth daily. Takes as needed) 30 tablet 0   No facility-administered medications prior to visit.    Review of Systems  Constitutional:  Negative for chills, diaphoresis, fever, malaise/fatigue and weight loss.  HENT:  Negative for congestion.   Respiratory:  Negative for cough, hemoptysis, sputum production, shortness of breath and wheezing.   Cardiovascular:  Negative for chest pain, palpitations and leg  swelling.  Gastrointestinal:  Positive for heartburn.     Objective:   Vitals:   09/13/24 0952 09/13/24 1038  BP: (!) 164/92 137/81  Pulse: 73   SpO2: 94%   Weight: 152 lb (68.9 kg)   Height: 5' 7 (1.702 m)     SpO2: 94 %  Physical Exam: General: Elderly, well-appearing, no acute distress HENT: [place], AT Eyes: EOMI, no scleral icterus Respiratory: Clear to auscultation bilaterally.  No crackles, wheezing or rales Cardiovascular: RRR, -M/R/G, no JVD Extremities:-Edema,-tenderness Neuro: AAO x4, CNII-XII grossly intact Psych: Normal mood, normal affect  Data Reviewed:  Imaging: CT Chest 12/09/23 - Progression of subpleural scarring and septal thickening, scatted GGO. Mild cylindrical bronchiectasis. Trace bilateral effusions CTA 07/12/24 - Visualized lung parenchyma with chronic volume loss in left hemothorax, scattered scarring in lung bilaterally  PFT: 02/01/24 FVC 1.32 (52%) FEV1 1.02 (54%) Ratio 77  TLC 77% DLCO Unable to obtain  Interpretation: Moderately severe restrictive defect  09/13/24 FVC 1.28 (51%) FEV1 1.20 (47%) Ratio 68   Interpretation: Reduced FEV1 and FVC   Labs: CBC    Component Value Date/Time   WBC 6.7 05/09/2024 0339   RBC 3.95 05/09/2024 0339   HGB 12.1 05/09/2024 0339   HGB 11.8 04/14/2022 1216   HGB 11.1 (L) 02/17/2015 1054   HCT 37.3 05/09/2024 0339   HCT 34.8 04/14/2022 1216   HCT 34.3 (L) 02/17/2015 1054   PLT 239 05/09/2024 0339   PLT 204 04/14/2022 1216   MCV 94.4 05/09/2024 0339   MCV 86 04/14/2022 1216   MCV 87.6 02/17/2015 1054   MCH 30.6 05/09/2024 0339   MCHC 32.4 05/09/2024 0339   RDW 14.5 05/09/2024 0339   RDW 14.2 04/14/2022 1216   RDW 13.5 02/17/2015 1054   LYMPHSABS 0.9 12/12/2023 0453   LYMPHSABS 1.1 03/27/2021 1443   LYMPHSABS 1.3 02/17/2015 1054   MONOABS 0.6 12/12/2023 0453   MONOABS 0.4 02/17/2015 1054   EOSABS 0.0 12/12/2023 0453   EOSABS 0.1 03/27/2021 1443   BASOSABS 0.0 12/12/2023 0453   BASOSABS 0.0  03/27/2021 1443   BASOSABS 0.0 02/17/2015 1054      Assessment & Plan:   Discussion: 89 year old female with ILD, paroxysmal atrial fibrillation, mitral valve prolapse, hx DVT, hypothyroidism, hx breast cancer who presents for follow-up. Stable and off oxygen. Discussed clinical course and management of ILD including bronchodilator regimen, preventive care including PPI, vaccinations and action plan for exacerbation (bronchodilators and steroids and oxygen if indicated).   Acute on chronic hypoxemic respiratory failure - resolved O2 picked up in June  ILD Reviewed PFTs with moderately severe restriction that is stable START omeprazole 20 mg daily for lung protection from silent reflux Off Symbicort . No changes in symbicort  Flu shot today. Plans to get RSV later Encouraged regular aerobic activity 30 min daily. Goal step count 5000  Health Maintenance Immunization History  Administered Date(s) Administered   Fluzone Influenza virus vaccine,trivalent (IIV3), split virus 09/17/2015   INFLUENZA, HIGH DOSE SEASONAL PF 09/06/2014, 09/22/2015, 08/31/2017, 08/29/2018, 08/09/2019, 08/21/2019, 09/02/2020, 08/25/2021, 09/13/2024   Influenza Split 10/08/2009, 09/15/2010, 10/28/2011, 10/31/2012, 09/27/2013   Influenza,inj,Quad PF,6+ Mos 09/12/2016   PFIZER(Purple Top)SARS-COV-2 Vaccination 01/02/2020, 01/23/2020, 09/22/2020, 06/11/2021   Pneumococcal Conjugate-13 09/06/2014   Pneumococcal Polysaccharide-23 09/12/2002, 06/20/2017   Td 12/13/2005   Tdap 06/07/2016   Zoster, Live 08/14/2008, 08/19/2020, 12/19/2020   CT Lung Screen- not qualified  Orders Placed This Encounter  Procedures   Flu vaccine HIGH DOSE PF(Fluzone Trivalent)   Meds ordered this encounter  Medications   omeprazole (PRILOSEC) 20 MG capsule    Sig: Take 1 capsule (20 mg total) by mouth daily.    Dispense:  30 capsule    Refill:  11    Return in about 6 months (around 03/14/2025).  I have spent a total time of  31-minutes on the day of the appointment including chart review, data review, collecting history, coordinating care and discussing medical diagnosis and  plan with the patient/family. Past medical history, allergies, medications were reviewed. Pertinent imaging, labs and tests included in this note have been reviewed and interpreted independently by me.  Mattthew Ziomek Slater Staff, MD [facility] 09/13/2024 10:44 AM

## 2024-09-19 ENCOUNTER — Ambulatory Visit: Admitting: Obstetrics

## 2024-09-25 DIAGNOSIS — Z0000 Encounter for general adult medical examination without abnormal findings: Secondary | ICD-10-CM | POA: Diagnosis not present

## 2024-09-25 DIAGNOSIS — E039 Hypothyroidism, unspecified: Secondary | ICD-10-CM | POA: Diagnosis not present

## 2024-09-25 DIAGNOSIS — R7303 Prediabetes: Secondary | ICD-10-CM | POA: Diagnosis not present

## 2024-09-25 DIAGNOSIS — Z79899 Other long term (current) drug therapy: Secondary | ICD-10-CM | POA: Diagnosis not present

## 2024-09-25 DIAGNOSIS — I872 Venous insufficiency (chronic) (peripheral): Secondary | ICD-10-CM | POA: Diagnosis not present

## 2024-09-25 DIAGNOSIS — I890 Lymphedema, not elsewhere classified: Secondary | ICD-10-CM | POA: Diagnosis not present

## 2024-09-27 DIAGNOSIS — L97911 Non-pressure chronic ulcer of unspecified part of right lower leg limited to breakdown of skin: Secondary | ICD-10-CM | POA: Diagnosis not present

## 2024-09-27 DIAGNOSIS — L950 Livedoid vasculitis: Secondary | ICD-10-CM | POA: Diagnosis not present

## 2024-09-27 DIAGNOSIS — L0889 Other specified local infections of the skin and subcutaneous tissue: Secondary | ICD-10-CM | POA: Diagnosis not present

## 2024-09-27 DIAGNOSIS — L97211 Non-pressure chronic ulcer of right calf limited to breakdown of skin: Secondary | ICD-10-CM | POA: Diagnosis not present

## 2024-09-27 DIAGNOSIS — Z85828 Personal history of other malignant neoplasm of skin: Secondary | ICD-10-CM | POA: Diagnosis not present

## 2024-09-27 DIAGNOSIS — L0101 Non-bullous impetigo: Secondary | ICD-10-CM | POA: Diagnosis not present

## 2024-10-10 DIAGNOSIS — I890 Lymphedema, not elsewhere classified: Secondary | ICD-10-CM | POA: Diagnosis not present

## 2024-10-31 ENCOUNTER — Other Ambulatory Visit: Payer: Self-pay

## 2024-10-31 ENCOUNTER — Emergency Department (EMERGENCY_DEPARTMENT_HOSPITAL): Admit: 2024-10-31 | Discharge: 2024-10-31 | Disposition: A | Discharge status: Home / Self Care

## 2024-10-31 ENCOUNTER — Inpatient Hospital Stay (HOSPITAL_COMMUNITY)
Admission: EM | Admit: 2024-10-31 | Discharge: 2024-11-03 | DRG: 602 | Disposition: A | Discharge status: Home / Self Care | Attending: Internal Medicine | Admitting: Internal Medicine

## 2024-10-31 ENCOUNTER — Emergency Department (HOSPITAL_COMMUNITY)

## 2024-10-31 DIAGNOSIS — Z79890 Hormone replacement therapy (postmenopausal): Secondary | ICD-10-CM | POA: Diagnosis not present

## 2024-10-31 DIAGNOSIS — Z79899 Other long term (current) drug therapy: Secondary | ICD-10-CM

## 2024-10-31 DIAGNOSIS — I341 Nonrheumatic mitral (valve) prolapse: Secondary | ICD-10-CM | POA: Diagnosis present

## 2024-10-31 DIAGNOSIS — L0390 Cellulitis, unspecified: Secondary | ICD-10-CM | POA: Diagnosis present

## 2024-10-31 DIAGNOSIS — Z888 Allergy status to other drugs, medicaments and biological substances status: Secondary | ICD-10-CM | POA: Diagnosis not present

## 2024-10-31 DIAGNOSIS — R600 Localized edema: Secondary | ICD-10-CM | POA: Diagnosis present

## 2024-10-31 DIAGNOSIS — L89612 Pressure ulcer of right heel, stage 2: Secondary | ICD-10-CM | POA: Diagnosis present

## 2024-10-31 DIAGNOSIS — E785 Hyperlipidemia, unspecified: Secondary | ICD-10-CM | POA: Diagnosis present

## 2024-10-31 DIAGNOSIS — Z6825 Body mass index (BMI) 25.0-25.9, adult: Secondary | ICD-10-CM

## 2024-10-31 DIAGNOSIS — I110 Hypertensive heart disease with heart failure: Secondary | ICD-10-CM | POA: Diagnosis present

## 2024-10-31 DIAGNOSIS — E039 Hypothyroidism, unspecified: Secondary | ICD-10-CM | POA: Diagnosis present

## 2024-10-31 DIAGNOSIS — I509 Heart failure, unspecified: Secondary | ICD-10-CM

## 2024-10-31 DIAGNOSIS — Z885 Allergy status to narcotic agent status: Secondary | ICD-10-CM

## 2024-10-31 DIAGNOSIS — D649 Anemia, unspecified: Secondary | ICD-10-CM | POA: Diagnosis present

## 2024-10-31 DIAGNOSIS — Z950 Presence of cardiac pacemaker: Secondary | ICD-10-CM | POA: Diagnosis present

## 2024-10-31 DIAGNOSIS — Z90710 Acquired absence of both cervix and uterus: Secondary | ICD-10-CM | POA: Diagnosis not present

## 2024-10-31 DIAGNOSIS — Z8261 Family history of arthritis: Secondary | ICD-10-CM | POA: Diagnosis not present

## 2024-10-31 DIAGNOSIS — I444 Left anterior fascicular block: Secondary | ICD-10-CM | POA: Diagnosis present

## 2024-10-31 DIAGNOSIS — I890 Lymphedema, not elsewhere classified: Secondary | ICD-10-CM | POA: Diagnosis present

## 2024-10-31 DIAGNOSIS — I5033 Acute on chronic diastolic (congestive) heart failure: Secondary | ICD-10-CM | POA: Diagnosis present

## 2024-10-31 DIAGNOSIS — I480 Paroxysmal atrial fibrillation: Secondary | ICD-10-CM | POA: Diagnosis present

## 2024-10-31 DIAGNOSIS — Z853 Personal history of malignant neoplasm of breast: Secondary | ICD-10-CM

## 2024-10-31 DIAGNOSIS — L03119 Cellulitis of unspecified part of limb: Secondary | ICD-10-CM | POA: Diagnosis not present

## 2024-10-31 DIAGNOSIS — L03116 Cellulitis of left lower limb: Principal | ICD-10-CM | POA: Diagnosis present

## 2024-10-31 DIAGNOSIS — J849 Interstitial pulmonary disease, unspecified: Secondary | ICD-10-CM | POA: Diagnosis present

## 2024-10-31 DIAGNOSIS — M7989 Other specified soft tissue disorders: Secondary | ICD-10-CM | POA: Diagnosis not present

## 2024-10-31 DIAGNOSIS — I959 Hypotension, unspecified: Secondary | ICD-10-CM | POA: Diagnosis present

## 2024-10-31 DIAGNOSIS — Z7901 Long term (current) use of anticoagulants: Secondary | ICD-10-CM | POA: Diagnosis not present

## 2024-10-31 DIAGNOSIS — Z8249 Family history of ischemic heart disease and other diseases of the circulatory system: Secondary | ICD-10-CM | POA: Diagnosis not present

## 2024-10-31 DIAGNOSIS — Z91040 Latex allergy status: Secondary | ICD-10-CM

## 2024-10-31 DIAGNOSIS — I471 Supraventricular tachycardia, unspecified: Secondary | ICD-10-CM | POA: Diagnosis present

## 2024-10-31 DIAGNOSIS — Z803 Family history of malignant neoplasm of breast: Secondary | ICD-10-CM

## 2024-10-31 DIAGNOSIS — I5043 Acute on chronic combined systolic (congestive) and diastolic (congestive) heart failure: Secondary | ICD-10-CM | POA: Diagnosis not present

## 2024-10-31 DIAGNOSIS — Z820 Family history of epilepsy and other diseases of the nervous system: Secondary | ICD-10-CM

## 2024-10-31 DIAGNOSIS — I7121 Aneurysm of the ascending aorta, without rupture: Secondary | ICD-10-CM | POA: Diagnosis present

## 2024-10-31 DIAGNOSIS — I340 Nonrheumatic mitral (valve) insufficiency: Secondary | ICD-10-CM | POA: Diagnosis present

## 2024-10-31 DIAGNOSIS — I10 Essential (primary) hypertension: Secondary | ICD-10-CM | POA: Diagnosis not present

## 2024-10-31 DIAGNOSIS — E669 Obesity, unspecified: Secondary | ICD-10-CM | POA: Diagnosis present

## 2024-10-31 DIAGNOSIS — Z808 Family history of malignant neoplasm of other organs or systems: Secondary | ICD-10-CM

## 2024-10-31 LAB — PRO BRAIN NATRIURETIC PEPTIDE: Pro Brain Natriuretic Peptide: 4693 pg/mL — ABNORMAL HIGH (ref <300.0)

## 2024-10-31 LAB — CBC WITH DIFFERENTIAL/PLATELET
Abs Immature Granulocytes: 0.02 K/uL (ref 0.00–0.07)
Basophils Absolute: 0.1 K/uL (ref 0.0–0.1)
Basophils Relative: 1 %
Eosinophils Absolute: 1.7 K/uL — ABNORMAL HIGH (ref 0.0–0.5)
Eosinophils Relative: 22 %
HCT: 35.7 % — ABNORMAL LOW (ref 36.0–46.0)
Hemoglobin: 11.3 g/dL — ABNORMAL LOW (ref 12.0–15.0)
Immature Granulocytes: 0 %
Lymphocytes Relative: 13 %
Lymphs Abs: 1 K/uL (ref 0.7–4.0)
MCH: 28.8 pg (ref 26.0–34.0)
MCHC: 31.7 g/dL (ref 30.0–36.0)
MCV: 90.8 fL (ref 80.0–100.0)
Monocytes Absolute: 0.6 K/uL (ref 0.1–1.0)
Monocytes Relative: 8 %
Neutro Abs: 4.3 K/uL (ref 1.7–7.7)
Neutrophils Relative %: 56 %
Platelets: 202 K/uL (ref 150–400)
RBC: 3.93 MIL/uL (ref 3.87–5.11)
RDW: 14.6 % (ref 11.5–15.5)
WBC: 7.7 K/uL (ref 4.0–10.5)
nRBC: 0 % (ref 0.0–0.2)

## 2024-10-31 LAB — BASIC METABOLIC PANEL WITH GFR
Anion gap: 8 (ref 5–15)
BUN: 17 mg/dL (ref 8–23)
CO2: 28 mmol/L (ref 22–32)
Calcium: 9.2 mg/dL (ref 8.9–10.3)
Chloride: 103 mmol/L (ref 98–111)
Creatinine, Ser: 0.87 mg/dL (ref 0.44–1.00)
GFR, Estimated: >60 mL/min (ref >60)
Glucose, Bld: 127 mg/dL — ABNORMAL HIGH (ref 70–99)
Potassium: 3.6 mmol/L (ref 3.5–5.1)
Sodium: 139 mmol/L (ref 135–145)

## 2024-10-31 MED ORDER — VANCOMYCIN HCL IN DEXTROSE 1-5 GM/200ML-% IV SOLN
1000.0000 mg | INTRAVENOUS | Status: DC
  Administered 2024-10-31 – 2024-11-01 (×2): 1000 mg via INTRAVENOUS
  Filled 2024-10-31 (×2): qty 200

## 2024-10-31 MED ORDER — FUROSEMIDE 10 MG/ML IJ SOLN
40.0000 mg | Freq: Once | INTRAMUSCULAR | Status: AC
  Administered 2024-10-31: 40 mg via INTRAVENOUS
  Filled 2024-10-31: qty 4

## 2024-10-31 MED ORDER — SODIUM CHLORIDE 0.9 % IV SOLN
2.0000 g | Freq: Two times a day (BID) | INTRAVENOUS | Status: DC
  Administered 2024-11-01 – 2024-11-03 (×5): 2 g via INTRAVENOUS
  Filled 2024-10-31 (×5): qty 12.5

## 2024-10-31 MED ORDER — SODIUM CHLORIDE 0.9 % IV SOLN
2.0000 g | Freq: Once | INTRAVENOUS | Status: AC
  Administered 2024-10-31: 2 g via INTRAVENOUS
  Filled 2024-10-31: qty 20

## 2024-10-31 NOTE — Assessment & Plan Note (Signed)
 Left leg cellulitis  continue current antibiotic choice rocephin       plain films showed:   no evidence of air  no evidence of osteomyelitis   no        foreign   objects     Will obtain MRSA screening,         further antibiotic adjustment pending above results

## 2024-10-31 NOTE — Subjective & Objective (Signed)
 Patient is here with Left leg pain and swelling dopplers negative for DVT pt is on Eliquis  for a.fib No fever no chills Have been having some leg edema Known hx of CHF but no CP no SOB on furosemide  and spironolactone  for diuresis in the setting of diastolic heart failure. No SGLT 2 inh due to risk of urine infections.   Pt is on LAsix  40 mg po

## 2024-10-31 NOTE — ED Notes (Signed)
 X-ray at bedside.

## 2024-10-31 NOTE — Assessment & Plan Note (Signed)
 Continue eliquis  5 mg po bid and toprol  25 mg po q day

## 2024-10-31 NOTE — Assessment & Plan Note (Signed)
 Hold cozaar  and monitor renal function for tonight

## 2024-10-31 NOTE — Assessment & Plan Note (Signed)
 Chronic stable

## 2024-10-31 NOTE — ED Provider Notes (Signed)
 [place] EMERGENCY DEPARTMENT AT [facility] Provider Note   CSN: [id number] Arrival date & time: 10/31/24  1508     Patient presents with: Leg Swelling   [name] is a 89 y.o. female with past medical history of SVT, HTN, PAF (on Eliquis ), hypothyroidism, ILD, pacemaker, CHF (on Lasix ) presents to Emergency Department for evaluation of BLE swelling RLE>LLE. Also complains of RLE pain, erythema.  Is on Lasix  40 mg as needed for HF.  Reports that she typically takes Lasix  once a month.  Denies chest pain, shortness of breath, fevers at home, traumatic injury   HPI     Prior to Admission medications   Medication Sig Start Date End Date Taking? Authorizing Provider  acetaminophen  (TYLENOL ) 325 MG tablet Take 2 tablets (650 mg total) by mouth every 6 (six) hours as needed for mild pain (pain score 1-3) (or Fever >/= 101). 12/12/23   Sheikh, Alejandro Latif, DO  apixaban  (ELIQUIS ) 5 MG TABS tablet TAKE 1 TABLET BY MOUTH TWICE  DAILY 05/16/24   Waddell Danelle ORN, MD  Carboxymethylcellul-Glycerin (LUBRICATING EYE DROPS OP) Place 1 drop into both eyes 3 (three) times daily as needed (for dryness).    [provider]  estradiol  (ESTRACE ) 0.1 MG/GM vaginal cream Place 0.5g nightly for two weeks then twice a week after 03/29/24   Guadlupe Lianne DASEN, MD  furosemide  (LASIX ) 40 MG tablet Take 1 tablet (40 mg total) by mouth daily. Patient taking differently: Take 40 mg by mouth daily as needed. As needed 05/10/24   Arrien, Mauricio Daniel, MD  levothyroxine  (SYNTHROID ) 125 MCG tablet Take 125 mcg by mouth daily before breakfast. 05/30/23   [provider]  losartan  (COZAAR ) 50 MG tablet Take 1 tablet (50 mg total) by mouth daily. 06/04/24   Waddell Danelle ORN, MD  metoprolol  succinate (TOPROL -XL) 25 MG 24 hr tablet Take 1 tablet (25 mg total) by mouth daily. 06/13/24   Waddell Danelle ORN, MD  mirtazapine (REMERON) 7.5 MG tablet Take 7.5 mg by mouth at bedtime.    [provider]   mupirocin ointment (BACTROBAN) 2 % Apply 1 Application topically See admin instructions. Apply to affected area/ulcer on the right leg once a day    [provider]  omeprazole (PRILOSEC) 20 MG capsule Take 1 capsule (20 mg total) by mouth daily. 09/13/24   Kassie Acquanetta Bradley, MD  pentoxifylline  (TRENTAL ) 400 MG CR tablet Take 400 mg by mouth daily. 04/26/24   [provider]  Vibegron  (GEMTESA ) 75 MG TABS Take 1 tablet (75 mg total) by mouth daily. 06/28/24   Guadlupe Lianne DASEN, MD  Vitamin D, Ergocalciferol, (DRISDOL) 50000 units CAPS capsule Take 50,000 Units by mouth every Thursday.    [provider]    Allergies: Adhesive [tape], Codeine, Latex, Oxycodone-aspirin , Percodan [oxycodone-aspirin ], and Quinolones    Review of Systems  Skin:  Positive for wound.    Updated Vital Signs BP 137/76   Pulse 72   Temp 98.2 F (36.8 C) (Oral)   Resp 16   LMP  (LMP Unknown)   SpO2 94%   Physical Exam Vitals and nursing note reviewed.  Constitutional:      General: She is not in acute distress.    Appearance: Normal appearance.  HENT:     Head: Normocephalic and atraumatic.  Eyes:     Conjunctiva/sclera: Conjunctivae normal.  Cardiovascular:     Rate and Rhythm: Normal rate.  Pulmonary:     Effort: Pulmonary effort  is normal. No respiratory distress.  Musculoskeletal:     Right lower leg: 3+ Pitting Edema present.     Left lower leg: 3+ Pitting Edema present.     Comments: Generalized tenderness, swelling, erythema to left calf.  Swelling to RLE but no warmth, erythema. See media  Skin:    Coloration: Skin is not jaundiced or pale.  Neurological:     Mental Status: She is alert. Mental status is at baseline.     (all labs ordered are listed, but only abnormal results are displayed) Labs Reviewed  CBC WITH DIFFERENTIAL/PLATELET - Abnormal; Notable for the following components:      Result Value   Hemoglobin 11.3 (*)    HCT 35.7 (*)    Eosinophils Absolute  1.7 (*)    All other components within normal limits  BASIC METABOLIC PANEL WITH GFR - Abnormal; Notable for the following components:   Glucose, Bld 127 (*)    All other components within normal limits  PRO BRAIN NATRIURETIC PEPTIDE - Abnormal; Notable for the following components:   Pro Brain Natriuretic Peptide 4,693.0 (*)    All other components within normal limits    EKG: None  Radiology: VAS US  LOWER EXTREMITY VENOUS (DVT) (ONLY [facility] & [facility]) Result Date: 10/31/2024  Lower Venous DVT Study Patient Name:  [name]  Date of Exam:   10/31/2024 Medical Rec #: [id number]       Accession #:    [id number] Date of Birth: 1933-03-16       Patient Gender: F Patient Age:   76 years Exam Location:  [facility] Procedure:      VAS US  LOWER EXTREMITY VENOUS (DVT) Referring Phys: TINNIE MATTER --------------------------------------------------------------------------------  Indications: Swelling.  Risk Factors: None identified. Limitations: Poor ultrasound/tissue interface. Comparison Study: No prior studies. Performing Technologist: Cordella Collet RVT  Examination Guidelines: A complete evaluation includes B-mode imaging, spectral Doppler, color Doppler, and power Doppler as needed of all accessible portions of each vessel. Bilateral testing is considered an integral part of a complete examination. Limited examinations for reoccurring indications may be performed as noted. The reflux portion of the exam is performed with the patient in reverse Trendelenburg.  +-----+---------------+---------+-----------+----------+--------------+ RIGHTCompressibilityPhasicitySpontaneityPropertiesThrombus Aging +-----+---------------+---------+-----------+----------+--------------+ CFV  Full           Yes      Yes                                 +-----+---------------+---------+-----------+----------+--------------+   +---------+---------------+---------+-----------+----------+-------------------+ LEFT      CompressibilityPhasicitySpontaneityPropertiesThrombus Aging      +---------+---------------+---------+-----------+----------+-------------------+ CFV      Full           Yes      Yes                                      +---------+---------------+---------+-----------+----------+-------------------+ SFJ      Full                                                             +---------+---------------+---------+-----------+----------+-------------------+ FV Prox  Full                                                             +---------+---------------+---------+-----------+----------+-------------------+  FV Mid   Full                                                             +---------+---------------+---------+-----------+----------+-------------------+ FV DistalFull                                                             +---------+---------------+---------+-----------+----------+-------------------+ PFV      Full                                                             +---------+---------------+---------+-----------+----------+-------------------+ POP      Full           Yes      Yes                                      +---------+---------------+---------+-----------+----------+-------------------+ PTV      Full                                                             +---------+---------------+---------+-----------+----------+-------------------+ PERO                                                  Not well visualized +---------+---------------+---------+-----------+----------+-------------------+    Summary: RIGHT: - No evidence of common femoral vein obstruction.   LEFT: - There is no evidence of deep vein thrombosis in the lower extremity. However, portions of this examination were limited- see technologist comments above.  - No cystic structure found in the popliteal fossa.  *See table(s) above for measurements and  observations. Electronically signed by Lonni Gaskins MD on 10/31/2024 at 4:29:43 PM.    Final      Medications Ordered in the ED  furosemide  (LASIX ) injection 40 mg (has no administration in time range)  cefTRIAXone  (ROCEPHIN ) 2 g in sodium chloride  0.9 % 100 mL IVPB (has no administration in time range)                                    Medical Decision Making Amount and/or Complexity of Data Reviewed Labs: ordered. Radiology: ordered.  Risk Prescription drug management. Decision regarding hospitalization.   Patient presents to the ED for concern of BLE swelling, this involves an extensive number of treatment options, and is a complaint that carries with it a high risk of complications and morbidity.  The differential diagnosis includes DVT, cellulitis, fluid overload, osteomyelitis, infection, ischemic limb  Co morbidities that complicate the patient evaluation  See HPI   Additional history obtained:  Additional history obtained from  Nursing   External records from outside source obtained and reviewed including triage RN note   Lab Tests:  I Ordered, and personally interpreted labs.  The pertinent results include:   BNP 4693 Hgb 11.3 CBG 127   Imaging Studies ordered:  I ordered imaging studies including DVT ultrasound, chest x-ray, tib-fib x-ray I independently visualized and interpreted imaging which showed  No acute DVT Small left pleural effusion, decreased since previous. Patchy opacities at the left lung base  No acute fracture or dislocation. Diffuse soft tissue edema I agree with the radiologist interpretation   Cardiac Monitoring:  The patient was maintained on a cardiac monitor.  I personally viewed and interpreted the cardiac monitored which showed an underlying rhythm of: Paced EKG   Medicines ordered and prescription drug management:  I ordered medication including Rocephin , Lasix  for cellulitis, fluid overload Reevaluation of the  patient after these medicines showed that the patient stayed the same I have reviewed the patients home medicines and have made adjustments as needed    Consultations Obtained:  I requested consultation with the hospitalist,  and discussed lab and imaging findings as well as pertinent plan - Dr. Silvester accepts patient for admission   Problem List / ED Course:  Leg edema Acute CHF Chest x-ray notable for paced rhythm with no signs of ischemia BLE both have significantly pitting edema BNP elevated at greater than 4000 No complaints of chest pain, shortness of breath.  No gross pleural effusion on chest x-ray Patient takes Lasix  40 mg p.o. once every month.  Did provide her Lasix  IV for diuresis This is likely contributing to BLE edema No signs of ischemic limb.  DP 1+ bilaterally. No history of trauma. Did obtain x-ray of tib-fib to ensure no osteomyelitis which is negative  Cellulitis Patient originally came in for LLE swelling, warmth, erythema DVT ultrasound negative Will cover for cellulitis as it is erythematous, warm and cellulitic appearing.  Did provide antibiotics in ED   Reevaluation:  After the interventions noted above, I reevaluated the patient and found that they have :stayed the same    Dispostion:  After consideration of the diagnostic results and the patients response to treatment, I feel that the patent would benefit from admission for IV antibiotics for cellulitis, fluid overload.   Discussed ED workup, dispo with patient and patient's family at bedside who expressed understanding with plan.  All questions answered to their satisfaction.  Final diagnoses:  Cellulitis of lower extremity, unspecified laterality  Leg edema  Acute on chronic heart failure, unspecified heart failure type [facility])    ED Discharge Orders     None        Minnie Tinnie BRAVO, PA 10/31/24 2354    Patt Alm Macho, MD 11/01/24 [phone]

## 2024-10-31 NOTE — ED Provider Triage Note (Signed)
 Emergency Medicine Provider Triage Evaluation Note  [name] , a 89 y.o. female  was evaluated in triage.  Pt complains of LLE swelling and pain. No hx of DVT. On eliquis  for a fib. Reports complaince. Denies fever, drainage. Hx of CHF. On lasix   Review of Systems  Positive: LLE swelling Negative: CP, shob, fever  Physical Exam  BP 124/72   Pulse 84   Temp 98.4 F (36.9 C) (Oral)   Resp 18   LMP  (LMP Unknown)   SpO2 94%  Gen:   Awake, no distress   Resp:  Normal effort  MSK:   Moves extremities without difficulty  Other:  Erythematous warm swollen LLE>RLE  Medical Decision Making  Medically screening exam initiated at 3:50 PM.  Appropriate orders placed.  [name] was informed that the remainder of the evaluation will be completed by another provider, this initial triage assessment does not replace that evaluation, and the importance of remaining in the ED until their evaluation is complete.  Labs and DVT US  ordered   Tracy Tinnie BRAVO, PA 10/31/24 1552

## 2024-10-31 NOTE — Progress Notes (Signed)
 Pharmacy Antibiotic Note  [name] is a 89 y.o. female admitted on 10/31/2024 with cellulitis.  Pharmacy has been consulted for Cefepime & Vancomycin dosing.  Using previous weight of 68.9kg & Scr 0.87> estimated CrCl ~19ml/min  Plan: Cefepime 2gm IV q12h Vancomycin 1gm IV q24h for est AUC 506.7 (goal AUC 400-550) Monitor renal function and cx data      Temp (24hrs), Avg:98.3 F (36.8 C), Min:98.2 F (36.8 C), Max:98.4 F (36.9 C)  Recent Labs  Lab 10/31/24 1637  WBC 7.7  CREATININE 0.87    CrCl cannot be calculated (Unknown ideal weight.).    Allergies  Allergen Reactions   Quinolones Other (See Comments)    Patient with ATAA (ascending thoracic aortic aneurysm)   Adhesive [Tape] Rash and Other (See Comments)    Including EKG electrodes and the like   Codeine Other (See Comments)    Reaction not recalled   Latex Itching   Oxycodone-Aspirin  Nausea And Vomiting   Percodan [Oxycodone-Aspirin ] Nausea And Vomiting    Antimicrobials this admission: 11/19 Rocephin  x1 11/20 Cefepime >>  11/19 Vancomycin >>   Dose adjustments this admission:  Microbiology results:  Thank you for allowing pharmacy to be a part of this patient's care.  Rosaline Millet PharmD 10/31/2024 9:42 PM

## 2024-10-31 NOTE — H&P (Signed)
 [name] FMW:[id number] DOB: 1933-06-20 DOA: 10/31/2024     PCP: Dwight Trula SQUIBB, MD   Outpatient Specialists: * NONE CARDS:   Dr. Waddell    Patient arrived to ER on 10/31/24 at 1508 Referred by Attending Silvester Ales, MD   Patient coming from:    home Lives  With family      Chief Complaint:   Chief Complaint  Patient presents with   Leg Swelling    HPI: [name] is a 89 y.o. female with medical history significant of interstitial lung disease, Aneurysm of ascending aorta without rupture, HTN mitral prolapse, SVT, A.fib on eliquis , chronic diastolic CHF, hypothyroidism     Presented with  left leg swelling and redness Patient is here with Left leg pain and swelling dopplers negative for DVT pt is on Eliquis  for a.fib No fever no chills Have been having some leg edema Known hx of CHF but no CP no SOB on furosemide  and spironolactone  for diuresis in the setting of diastolic heart failure. No SGLT 2 inh due to risk of urine infections.   Pt is on LAsix  40 mg po     Has not been more short of breath no signification abd distention n constipation her weight has gone up today  Her eight went up only one pound  But her leg edema has became more severe on both sides but left more than right  She has had some salty foods No fever no chills no diarrhea no nausea no vomiting  No cp no syncope  Denies significant ETOH intake   Does not smoke      Regarding pertinent Chronic problems:     Hyperlipidemia - not on statins   Lipid Panel     Component Value Date/Time   CHOL 157 05/07/2024 0418   TRIG 85 05/07/2024 0418   HDL 59 05/07/2024 0418   CHOLHDL 2.7 05/07/2024 0418   VLDL 17 05/07/2024 0418   LDLCALC 81 05/07/2024 0418     HTN on cozaar , toprol    chronic CHF diastolic - last echo  Recent Results (from the past 56199 hours)  ECHOCARDIOGRAM COMPLETE   Collection Time: 05/07/24  1:10 PM  Result Value   Weight 2,610.25   Height 67.5   BP  132/81   Area-P 1/2 5.70   Est EF 60 - 65%   Narrative      ECHOCARDIOGRAM REPORT       1. Left ventricular ejection fraction, by estimation, is 60 to 65%. The left ventricle has normal function. The left ventricle has no regional wall motion abnormalities. Left ventricular diastolic parameters are indeterminate.  2. Right ventricular systolic function is normal. The right ventricular size is normal.  3. Left atrial size was severely dilated.  4. Right atrial size was severely dilated.  5. The mitral valve is abnormal. Mild to moderate mitral valve regurgitation. No evidence of mitral stenosis.  6. The aortic valve is tricuspid. There is mild calcification of the aortic valve. There is mild thickening of the aortic valve. Aortic valve regurgitation is trivial. Aortic valve sclerosis is present, with no evidence of aortic valve stenosis.  7. Aortic dilatation noted. There is moderate dilatation of the ascending aorta, measuring 42 mm.  8. The inferior vena cava is normal in size with greater than 50% respiratory variability, suggesting right atrial pressure of 3 mmHg.                Hypothyroidism:   Lab  Results  Component Value Date   TSH 2.400 04/14/2022   on synthroid      A. Fib -   atrial fibrillation CHA2DS2 vas score    5        current  on anticoagulation with   Eliquis ,           -  Rate control:   Toprolol,        Chronic anemia - baseline hg Hemoglobin & Hematocrit  Recent Labs    05/07/24 0418 05/09/24 0339 10/31/24 1637  HGB 11.2* 12.1 11.3*   Iron/TIBC/Ferritin/ %Sat    Component Value Date/Time   IRON 45 12/12/2023 0453   TIBC 281 12/12/2023 0453   FERRITIN 219 12/12/2023 0453   IRONPCTSAT 16 12/12/2023 0453     While in ER:     Noted to have left leg edema and redeness     Lab Orders         MRSA Next Gen by PCR, Nasal         CBC with Differential         Basic metabolic panel         Pro Brain natriuretic peptide         CK          Magnesium          Phosphorus         Lactic acid, plasma         TSH         Prealbumin         Comprehensive metabolic panel        CXR -  1. Small left pleural effusion, decreased since previous. 2. Patchy opacities at the left lung base, improved since previous. 3. Low lung volumes with crowding of bronchovascular structures.  Left leg Diffuse soft tissue edema.   DVT - There is no evidence of deep vein thrombosis in the lower extremity.     Following Medications were ordered in ER: Medications  vancomycin  (VANCOCIN ) IVPB 1000 mg/200 mL premix (1,000 mg Intravenous New Bag/Given 10/31/24 2223)  ceFEPIme  (MAXIPIME ) 2 g in sodium chloride  0.9 % 100 mL IVPB (has no administration in time range)  furosemide  (LASIX ) injection 40 mg (40 mg Intravenous Given 10/31/24 2200)  cefTRIAXone  (ROCEPHIN ) 2 g in sodium chloride  0.9 % 100 mL IVPB (0 g Intravenous Stopped 10/31/24 2200)        ED Triage Vitals  Encounter Vitals Group     BP 10/31/24 1528 124/72     Girls Systolic BP Percentile --      Girls Diastolic BP Percentile --      Boys Systolic BP Percentile --      Boys Diastolic BP Percentile --      Pulse Rate 10/31/24 1528 84     Resp 10/31/24 1528 18     Temp 10/31/24 1528 98.4 F (36.9 C)     Temp Source 10/31/24 1528 Oral     SpO2 10/31/24 1528 94 %     Weight --      Height --      Head Circumference --      Peak Flow --      Pain Score 10/31/24 1602 4     Pain Loc --      Pain Education --      Exclude from Growth Chart --   UFJK(75)@     _________________________________________ Significant initial  Findings: Abnormal Labs Reviewed  CBC WITH DIFFERENTIAL/PLATELET -  Abnormal; Notable for the following components:      Result Value   Hemoglobin 11.3 (*)    HCT 35.7 (*)    Eosinophils Absolute 1.7 (*)    All other components within normal limits  BASIC METABOLIC PANEL WITH GFR - Abnormal; Notable for the following components:   Glucose, Bld 127 (*)    All  other components within normal limits  PRO BRAIN NATRIURETIC PEPTIDE - Abnormal; Notable for the following components:   Pro Brain Natriuretic Peptide 4,693.0 (*)    All other components within normal limits        ECG: Ordered  BNP (last 3 results) Recent Labs    05/06/24 1625  BNP 439.3*       The recent clinical data is shown below. Vitals:   10/31/24 1528 10/31/24 1850 10/31/24 2243  BP: 124/72 137/76   Pulse: 84 72   Resp: 18 16   Temp: 98.4 F (36.9 C) 98.2 F (36.8 C) 98 F (36.7 C)  TempSrc: Oral Oral   SpO2: 94% 94%     WBC     Component Value Date/Time   WBC 7.7 10/31/2024 1637   LYMPHSABS 1.0 10/31/2024 1637   LYMPHSABS 1.1 03/27/2021 1443   LYMPHSABS 1.3 02/17/2015 1054   MONOABS 0.6 10/31/2024 1637   MONOABS 0.4 02/17/2015 1054   EOSABS 1.7 (H) 10/31/2024 1637   EOSABS 0.1 03/27/2021 1443   BASOSABS 0.1 10/31/2024 1637   BASOSABS 0.0 03/27/2021 1443   BASOSABS 0.0 02/17/2015 1054      Results for orders placed or performed during the hospital encounter of 05/06/24  Urine Culture (for pregnant, neutropenic or urologic patients or patients with an indwelling urinary catheter)     Status: Abnormal   Collection Time: 05/07/24  8:21 AM   Specimen: Urine, Clean Catch  Result Value Ref Range Status   Specimen Description URINE, CLEAN CATCH  Final   Special Requests NONE  Final   Culture (A)  Final    <10,000 COLONIES/mL INSIGNIFICANT GROWTH Performed at [facility], [address], [place], [place] [address]    Report Status 05/08/2024 FINAL  Final    ABX started Antibiotics Given (last 72 hours)     Date/Time Action Medication Dose Rate   10/31/24 2114 New Bag/Given   cefTRIAXone  (ROCEPHIN ) 2 g in sodium chloride  0.9 % 100 mL IVPB 2 g 200 mL/hr   10/31/24 2223 New Bag/Given   vancomycin  (VANCOCIN ) IVPB 1000 mg/200 mL premix 1,000 mg 200 mL/hr       __________________________________________________________ Recent Labs  Lab  10/31/24 1637  NA 139  K 3.6  CO2 28  GLUCOSE 127*  BUN 17  CREATININE 0.87  CALCIUM 9.2    Cr  stable,    Lab Results  Component Value Date   CREATININE 0.87 10/31/2024   CREATININE 1.06 (H) 05/25/2024   CREATININE 1.00 05/10/2024    No results for input(s): AST, ALT, ALKPHOS, BILITOT, PROT, ALBUMIN in the last 168 hours. Lab Results  Component Value Date   CALCIUM 9.2 10/31/2024   PHOS 3.1 12/12/2023       Plt: Lab Results  Component Value Date   PLT 202 10/31/2024       Recent Labs  Lab 10/31/24 1637  WBC 7.7  NEUTROABS 4.3  HGB 11.3*  HCT 35.7*  MCV 90.8  PLT 202    HG/HCT  stable,      Component Value Date/Time   HGB 11.3 (L)  10/31/2024 1637   HGB 11.8 04/14/2022 1216   HGB 11.1 (L) 02/17/2015 1054   HCT 35.7 (L) 10/31/2024 1637   HCT 34.8 04/14/2022 1216   HCT 34.3 (L) 02/17/2015 1054   MCV 90.8 10/31/2024 1637   MCV 86 04/14/2022 1216   MCV 87.6 02/17/2015 1054    _______________________________________________ Hospitalist was called for admission for   Cellulitis of lower extremity,    Acute on chronic heart failure,     Orders Placed This Encounter  Procedures   MRSA Next Gen by PCR, Nasal   DG Chest Portable 1 View   DG Tibia/Fibula Left   CBC with Differential   Basic metabolic panel   Pro Brain natriuretic peptide   CK   Magnesium    Phosphorus   Lactic acid, plasma   TSH   Prealbumin   Comprehensive metabolic panel   Cardiac Monitoring Continuous x 48 hours Indications for use: Other; Other indications for use: chf   Apply Cellulitis Care Plan   Full code   Consult to hospitalist   vancomycin  per pharmacy consult   Consult to Transition of Care Team   Consult to Heart Failure Navigation Team ([facility], [facility], and [facility])   Admit to Inpatient (patient's expected length of stay will be greater than 2 midnights or inpatient only procedure)   VAS US  LOWER EXTREMITY VENOUS (DVT) (ONLY [facility] & [facility])     OTHER Significant  initial  Findings:  labs showing:     DM  labs:  HbA1C: No results for input(s): HGBA1C in the last 8760 hours.     CBG (last 3)  No results for input(s): GLUCAP in the last 72 hours.        Cultures:    Component Value Date/Time   SDES URINE, CLEAN CATCH 05/07/2024 0821   SPECREQUEST NONE 05/07/2024 0821   CULT (A) 05/07/2024 0821    <10,000 COLONIES/mL INSIGNIFICANT GROWTH Performed at [facility], [address], [place], [place] [address]    REPTSTATUS 05/08/2024 FINAL 05/07/2024 9178     Radiological Exams on Admission: DG Tibia/Fibula Left Result Date: 10/31/2024 EXAM: 1 VIEW(S) XRAY OF THE LEFT TIBIA AND FIBULA 10/31/2024 08:24:00 PM COMPARISON: None available. CLINICAL HISTORY: r/o osteo, fx FINDINGS: BONES AND JOINTS: Mild to moderate degenerative changes of the knee. Superior patellar enthesopathy. No acute fracture. No focal osseous lesion. No joint dislocation. SOFT TISSUES: Diffuse soft tissue edema. Vascular calcifications. IMPRESSION: 1. No acute fracture or dislocation. 2. Diffuse soft tissue edema. Electronically signed by: Morgane Naveau MD 10/31/2024 08:44 PM EST RP Workstation: [id number]   DG Chest Portable 1 View Result Date: 10/31/2024 EXAM: 1 VIEW(S) XRAY OF THE CHEST 10/31/2024 08:24:00 PM COMPARISON: CXR 05/06/2024, CT Chest 07/12/2024. CLINICAL HISTORY: r/o effusion FINDINGS: LINES, TUBES AND DEVICES: Stable left dual lead. LUNGS AND PLEURA: Low lung volumes with crowding of bronchovascular structures are noted. Small left pleural effusion, decreased since previous. Patchy opacities at the left lung base, improved since previous. Diffuse coarsened interstitial markings are present. No pneumothorax. HEART AND MEDIASTINUM: No acute abnormality of the cardiac and mediastinal silhouettes. Aortic atherosclerosis is present. BONES AND SOFT TISSUES: Rotator cuff anchors are present in the left humeral head. IMPRESSION: 1. Small left pleural effusion,  decreased since previous. 2. Patchy opacities at the left lung base, improved since previous. 3. Low lung volumes with crowding of bronchovascular structures. Electronically signed by: Morgane Naveau MD 10/31/2024 08:42 PM EST RP Workstation: [id number]   VAS US  LOWER EXTREMITY VENOUS (  DVT) (ONLY [facility] & [facility]) Result Date: 10/31/2024  Lower Venous DVT Study Patient Name:  [name]  Date of Exam:   10/31/2024 Medical Rec #: [id number]       Accession #:    [id number] Date of Birth: 07/31/1933       Patient Gender: F Patient Age:   26 years Exam Location:  [facility] Procedure:      VAS US  LOWER EXTREMITY VENOUS (DVT) Referring Phys: TINNIE MATTER --------------------------------------------------------------------------------  Indications: Swelling.  Risk Factors: None identified. Limitations: Poor ultrasound/tissue interface. Comparison Study: No prior studies. Performing Technologist: Cordella Collet RVT  Examination Guidelines: A complete evaluation includes B-mode imaging, spectral Doppler, color Doppler, and power Doppler as needed of all accessible portions of each vessel. Bilateral testing is considered an integral part of a complete examination. Limited examinations for reoccurring indications may be performed as noted. The reflux portion of the exam is performed with the patient in reverse Trendelenburg.  +-----+---------------+---------+-----------+----------+--------------+ RIGHTCompressibilityPhasicitySpontaneityPropertiesThrombus Aging +-----+---------------+---------+-----------+----------+--------------+ CFV  Full           Yes      Yes                                 +-----+---------------+---------+-----------+----------+--------------+   +---------+---------------+---------+-----------+----------+-------------------+ LEFT     CompressibilityPhasicitySpontaneityPropertiesThrombus Aging       +---------+---------------+---------+-----------+----------+-------------------+ CFV      Full           Yes      Yes                                      +---------+---------------+---------+-----------+----------+-------------------+ SFJ      Full                                                             +---------+---------------+---------+-----------+----------+-------------------+ FV Prox  Full                                                             +---------+---------------+---------+-----------+----------+-------------------+ FV Mid   Full                                                             +---------+---------------+---------+-----------+----------+-------------------+ FV DistalFull                                                             +---------+---------------+---------+-----------+----------+-------------------+ PFV      Full                                                             +---------+---------------+---------+-----------+----------+-------------------+  POP      Full           Yes      Yes                                      +---------+---------------+---------+-----------+----------+-------------------+ PTV      Full                                                             +---------+---------------+---------+-----------+----------+-------------------+ PERO                                                  Not well visualized +---------+---------------+---------+-----------+----------+-------------------+    Summary: RIGHT: - No evidence of common femoral vein obstruction.   LEFT: - There is no evidence of deep vein thrombosis in the lower extremity. However, portions of this examination were limited- see technologist comments above.  - No cystic structure found in the popliteal fossa.  *See table(s) above for measurements and observations. Electronically signed by Lonni Gaskins MD on 10/31/2024 at  4:29:43 PM.    Final    _______________________________________________________________________________________________________ Latest  Blood pressure 137/76, pulse 72, temperature 98 F (36.7 C), resp. rate 16, SpO2 94%.   Vitals  labs and radiology finding personally reviewed  Review of Systems:    Pertinent positives include:  fatigue,  Constitutional:  No weight loss, night sweats, Fevers, chills,  weight loss  HEENT:  No headaches, Difficulty swallowing,Tooth/dental problems,Sore throat,  No sneezing, itching, ear ache, nasal congestion, post nasal drip,  Cardio-vascular:  No chest pain, Orthopnea, PND, anasarca, dizziness, palpitations.no Bilateral lower extremity swelling  GI:  No heartburn, indigestion, abdominal pain, nausea, vomiting, diarrhea, change in bowel habits, loss of appetite, melena, blood in stool, hematemesis Resp:  no shortness of breath at rest. No dyspnea on exertion, No excess mucus, no productive cough, No non-productive cough, No coughing up of blood.No change in color of mucus.No wheezing. Skin:  no rash or lesions. No jaundice GU:  no dysuria, change in color of urine, no urgency or frequency. No straining to urinate.  No flank pain.  Musculoskeletal:  No joint pain or no joint swelling. No decreased range of motion. No back pain.  Psych:  No change in mood or affect. No depression or anxiety. No memory loss.  Neuro: no localizing neurological complaints, no tingling, no weakness, no double vision, no gait abnormality, no slurred speech, no confusion  All systems reviewed and apart from HOPI all are negative _______________________________________________________________________________________________ Past Medical History:   Past Medical History:  Diagnosis Date   Allergic rhinitis    Arthritis    bil knees   Atypical atrial flutter (HCC) 04/22/2010   Qualifier: Diagnosis of   By: Lawernce CMA, Jewel         Breast cancer, left breast (HCC)  01/15/2015   Invasive Mammary   Chronic venous insufficiency    Hematuria    negative workup - Dr. Amiel   Hypertension    Hypothyroidism    Migraine headache    MVP (mitral valve prolapse)  OAB (overactive bladder)    Pacemaker    PAF (paroxysmal atrial fibrillation) (HCC)    SVT (supraventricular tachycardia) 06/2010   s/p AVNRT ablation   Wears glasses       Past Surgical History:  Procedure Laterality Date   A-FLUTTER ABLATION N/A 02/04/2017   Procedure: A-Flutter Ablation;  Surgeon: Danelle LELON Birmingham, MD;  Location: [facility] IN[facility];  Service: Cardiovascular;  Laterality: N/A;   ABDOMINAL HYSTERECTOMY     AV NODE ABLATION N/A 08/17/2017   Procedure: AV Node Ablation;  Surgeon: Birmingham Danelle LELON, MD;  Location: [facility] INVASIVE CV LAB;  Service: Cardiovascular;  Laterality: N/A;   BREAST LUMPECTOMY WITH RADIOACTIVE SEED LOCALIZATION Left 02/14/2015   Procedure: BREAST LUMPECTOMY WITH RADIOACTIVE SEED LOCALIZATION;  Surgeon: Alm Angle, MD;  Location: [facility];  Service: General;  Laterality: Left;   BREAST LUMPECTOMY WITH RADIOACTIVE SEED LOCALIZATION Left 11/18/2021   Procedure: LEFT BREAST LUMPECTOMY WITH RADIOACTIVE SEED LOCALIZATION;  Surgeon: Vanderbilt Ned, MD;  Location: [facility];  Service: General;  Laterality: Left;   BREAST SURGERY  1990   rt br bx   CARDIAC CATHETERIZATION  2011   ablasion   CARDIOVERSION N/A 08/10/2017   Procedure: CARDIOVERSION;  Surgeon: Jeffrie Oneil BROCKS, MD;  Location: [facility];  Service: Cardiovascular;  Laterality: N/A;   COLONOSCOPY     DILATION AND CURETTAGE OF UTERUS     Left Breast Biopsy  01/15/15   LEFT HEART CATH AND CORONARY ANGIOGRAPHY N/A 05/09/2024   Procedure: LEFT HEART CATH AND CORONARY ANGIOGRAPHY;  Surgeon: Jordan, Peter M, MD;  Location: [facility] INVASIVE CV LAB;  Service: Cardiovascular;  Laterality: N/A;   PACEMAKER IMPLANT N/A 08/17/2017   Procedure: Pacemaker Implant;  Surgeon: Birmingham Danelle LELON, MD;   Location: [facility] INVASIVE CV LAB;  Service: Cardiovascular;  Laterality: N/A;   TUBAL LIGATION      Social History:  Ambulatory   walker      reports that she has never smoked. She has never used smokeless tobacco. She reports current alcohol  use. She reports that she does not use drugs.     Family History:   Family History  Problem Relation Age of Onset   Hypertension Mother    Heart disease Mother    Arthritis Mother    Other Father        hardening of the arteries   Heart Problems Sister        pacemaker   Heart attack Brother    Throat cancer Paternal Uncle    Breast cancer Sister        early 12s   Alzheimer's disease Sister    Pulmonary disease Sister    Breast cancer Daughter 19       Negative genetic testing    Breast cancer Paternal Aunt        dx over 25   Cancer Paternal Aunt        cancer in her leg   Breast cancer Cousin        multiple paternal cousin   ______________________________________________________________________________________________ Allergies: Allergies  Allergen Reactions   Quinolones Other (See Comments)    Patient with ATAA (ascending thoracic aortic aneurysm)   Adhesive [Tape] Rash and Other (See Comments)    Including EKG electrodes and the like   Codeine Other (See Comments)    Reaction not recalled   Latex Itching   Oxycodone-Aspirin  Nausea And Vomiting   Percodan [Oxycodone-Aspirin ] Nausea And Vomiting     Prior  to Admission medications   Medication Sig Start Date End Date Taking? Authorizing Provider  acetaminophen  (TYLENOL ) 325 MG tablet Take 2 tablets (650 mg total) by mouth every 6 (six) hours as needed for mild pain (pain score 1-3) (or Fever >/= 101). 12/12/23  Yes Sheikh, Omair Latif, DO  apixaban  (ELIQUIS ) 5 MG TABS tablet TAKE 1 TABLET BY MOUTH TWICE  DAILY 05/16/24  Yes Waddell Danelle ORN, MD  furosemide  (LASIX ) 40 MG tablet Take 1 tablet (40 mg total) by mouth daily. Patient taking differently: Take 40 mg by mouth daily  as needed for fluid or edema. As needed 05/10/24  Yes Arrien, Mauricio Daniel, MD  gabapentin (NEURONTIN) 100 MG capsule Take 100 mg by mouth at bedtime.   Yes [provider]  levothyroxine  (SYNTHROID ) 125 MCG tablet Take 125 mcg by mouth daily before breakfast. 05/30/23  Yes [provider]  losartan  (COZAAR ) 50 MG tablet Take 1 tablet (50 mg total) by mouth daily. 06/04/24  Yes Waddell Danelle ORN, MD  metoprolol  succinate (TOPROL -XL) 25 MG 24 hr tablet Take 1 tablet (25 mg total) by mouth daily. Patient taking differently: Take 25 mg by mouth daily with lunch. 06/13/24  Yes Waddell Danelle ORN, MD  omeprazole  (PRILOSEC) 20 MG capsule Take 1 capsule (20 mg total) by mouth daily. Patient taking differently: Take 20 mg by mouth daily before breakfast. 09/13/24  Yes Kassie Acquanetta Bradley, MD  triamcinolone  cream (KENALOG ) 0.1 % Apply 1 Application topically 2 (two) times daily as needed (for red, firm, bumpy skin on the right lower leg- AVOID ANY ULCERS).   Yes [provider]  Vibegron  (GEMTESA ) 75 MG TABS Take 1 tablet (75 mg total) by mouth daily. 06/28/24  Yes Guadlupe Lianne DASEN, MD  Vitamin D, Ergocalciferol, (DRISDOL) 50000 units CAPS capsule Take 50,000 Units by mouth every Thursday.   Yes [provider]  estradiol  (ESTRACE ) 0.1 MG/GM vaginal cream Place 0.5g nightly for two weeks then twice a week after Patient not taking: Reported on 10/31/2024 03/29/24   Guadlupe Lianne T, MD    ___________________________________________________________________________________________________ Physical Exam:    10/31/2024    6:50 PM 10/31/2024    3:28 PM 09/13/2024   10:38 AM  Vitals with BMI  Systolic 137 124 862  Diastolic 76 72 81  Pulse 72 84      1. General:  in No  Acute distress   Chronically ill  -appearing 2. Psychological: Alert and   Oriented 3. Head/ENT:   Moist   Mucous Membranes                          Head Non traumatic, neck supple                     Poor  Dentition 4. SKIN: normal   Skin turgor,  Skin clean Dry and intact     Venostasis changes noted and redness over left leg 5. Heart: Regular rate and rhythm no  Murmur, no Rub or gallop 6. Lungs:  no wheezes or crackles   7. Abdomen: Soft,  non-tender distended bowel sounds present 8. Lower extremities: no clubbing, cyanosis, edema L >R 9. Neurologically Grossly intact, moving all 4 extremities equally   10. MSK: Normal range of motion    Chart has been reviewed  ______________________________________________________________________________________________  Assessment/Plan  89 y.o. female with medical history significant of interstitial lung disease, Aneurysm of ascending aorta without rupture, HTN mitral prolapse, SVT,  A.fib on eliquis , chronic diastolic CHF, hypothyroidism    Admitted for   Cellulitis of lower extremity,   Acute on chronic heart failure     Present on Admission:  Acute on chronic diastolic CHF (congestive heart failure) (HCC)  Essential hypertension  PAF (paroxysmal atrial fibrillation) (HCC)  Hypothyroidism  ILD (interstitial lung disease) (HCC)  Cellulitis  Pacemaker  SVT (supraventricular tachycardia)  Acute on chronic combined systolic and diastolic CHF (congestive heart failure) (HCC)     Acute on chronic diastolic CHF (congestive heart failure) (HCC) - Pt diagnosed with CHF based on presence of the following:  bilateral leg edema, With noted response to IV diuretic in ER  admit on telemetry,  cycle cardiac enzymes,     obtain serial ECG  to evaluate for ischemia as a cause of heart failure  monitor daily weight:  Last BNP BNP (last 3 results) Recent Labs    05/06/24 1625  BNP 439.3*       diurese with IV lasix    40 mg IV  Daily and monitor orthostatics and creatinine to avoid over diuresis.  Order echogram to evaluate EF and valves   Essential hypertension Hold cozaar  and monitor renal function for tonight  PAF (paroxysmal atrial  fibrillation) (HCC) Continue eliquis  5 mg po bid and toprol  25 mg po q day  Hypothyroidism - Check TSH continue home medications Synthroid  at 125 mcg po q day   ILD (interstitial lung disease) (HCC) Chronic stable  Cellulitis Left leg cellulitis  continue current antibiotic choice rocephin       plain films showed:   no evidence of air  no evidence of osteomyelitis   no        foreign   objects     Will obtain MRSA screening,         further antibiotic adjustment pending above results   Pacemaker Chronic stable  SVT (supraventricular tachycardia) Continue toprol    Other plan as per orders.  DVT prophylaxis:  eliquis      Code Status:    Code Status: Full Code FULL CODE  as per patient   I had personally discussed CODE STATUS with patient  ACP   none    Family Communication:   Family not at  Bedside    Diet  heart healthy   Disposition Plan:    To home once workup is complete and patient is stable   Following barriers for discharge:                             Chest pain *** Stroke *** Syncope ***work up is complete                            Electrolytes corrected                               Anemia corrected h/H stable                             Pain controlled with PO medications                               Afebrile, white count improving able to transition to PO antibiotics  Will need to be able to tolerate PO                            Will likely need home health, home O2, set up                           Will need consultants to evaluate patient prior to discharge                           Work of breathing improves       Consult Orders  (From admission, onward)           Start     Ordered   10/31/24 2004  Consult to hospitalist  Once       Provider:  (Not yet assigned)  Question Answer Comment  Place call to: [facility]   Reason for Consult Admit      10/31/24 2003                                Would benefit from PT/OT eval prior to DC  Ordered                                      Consults called:    NONE   Admission status:  ED Disposition     ED Disposition  Admit   Condition  --   Comment  Hospital [facility]: [facility] [[id number]]  Level of Care: Telemetry [5]  Admit to tele based on following criteria: Acute CHF  May admit patient to [facility] or [facility] if equivalent level of care is available:: No  Diagnosis: Acute on chronic combined systolic and diastolic CHF (congestive heart failure) [facility]) [[id number]]  Admitting Physician: Tuyen Uncapher [[id number]]  Attending Physician: Chery Giusto [[id number]]  Certification:: I certify this patient will need inpatient services for at least 2 midnights  Expected Medical Readiness: 11/02/2024           inpatient     I Expect 2 midnight stay secondary to severity of patient's current illness need for inpatient interventions justified by the following: ***hemodynamic instability despite optimal treatment (tachycardia *hypotension * tachypnea *hypoxia, hypercapnia) *** Severe lab/radiological/exam abnormalities including:    Cellulitis of lower extremity, unspecified laterality ***  Leg edema ***  Acute on chronic heart failure, unspecified heart failure type (HCC) ***  and extensive comorbidities including: *substance abuse  *Chronic pain *DM2  * CHF * CAD  * COPD/asthma *Morbid Obesity * CKD *dementia *liver disease *history of stroke with residual deficits *  malignancy, * sickle cell disease  History of amputation Chronic anticoagulation  That are currently affecting medical management.   I expect  patient to be hospitalized for 2 midnights requiring inpatient medical care.  Patient is at high risk for adverse outcome (such as loss of life or disability) if not treated.  Indication for inpatient stay as follows:  Severe change from baseline regarding mental status Hemodynamic  instability despite maximal medical therapy,  severe pain requiring acute inpatient management,  inability to maintain oral hydration   persistent chest pain despite medical management Need for operative/procedural  intervention New or worsening hypoxia ongoing suicidal ideations   Need for  IV antibiotics, IV fluids,, IV pain medications, IV anticoagulation,  IV rate controling medications, IV antihypertensives need for biPAP Frequent labs    Level of care   *** tele  For 12H 24H     medical floor       progressive     stepdown   tele indefinitely please discontinue once patient no longer qualifies COVID-19 Labs     Aloys Hupfer 10/31/2024, 11:10 PM    [facility]     after 2 AM please page floor coverage   If 7AM-7PM, please contact the day team taking care of the patient using [email or web address]

## 2024-10-31 NOTE — ED Notes (Signed)
 Pt sleeping when entering the room. Pt was easily aroused.Pt had no complaints. Bed set in lowest position with bed alarm within patient's reach.

## 2024-10-31 NOTE — ED Notes (Signed)
 [place] in progress

## 2024-10-31 NOTE — Assessment & Plan Note (Signed)
-   Pt diagnosed with CHF based on presence of the following:  bilateral leg edema, With noted response to IV diuretic in ER  admit on telemetry,  cycle cardiac enzymes,     obtain serial ECG  to evaluate for ischemia as a cause of heart failure  monitor daily weight:  Last BNP BNP (last 3 results) Recent Labs    05/06/24 1625  BNP 439.3*       diurese with IV lasix    40 mg IV  Daily and monitor orthostatics and creatinine to avoid over diuresis.  Order echogram to evaluate EF and valves

## 2024-10-31 NOTE — ED Notes (Signed)
 RN attempted to get IV access x2, unsuccessful.

## 2024-10-31 NOTE — ED Triage Notes (Signed)
 Pt has c/o left leg pain and swelling, here to r/o DVT

## 2024-10-31 NOTE — Assessment & Plan Note (Signed)
 Continue toprol

## 2024-10-31 NOTE — Progress Notes (Signed)
 Left lower extremity venous duplex has been completed. Preliminary results can be found in CV Proc through chart review.  Results were given to Dr. Dreama.  10/31/24 4:26 PM Cathlyn Collet RVT

## 2024-10-31 NOTE — Assessment & Plan Note (Signed)
 -  Check TSH continue home medications Synthroid at  125 mcg po q day

## 2024-11-01 ENCOUNTER — Encounter (HOSPITAL_COMMUNITY): Payer: Self-pay | Admitting: Internal Medicine

## 2024-11-01 ENCOUNTER — Inpatient Hospital Stay (HOSPITAL_COMMUNITY)

## 2024-11-01 DIAGNOSIS — L03116 Cellulitis of left lower limb: Secondary | ICD-10-CM | POA: Diagnosis not present

## 2024-11-01 DIAGNOSIS — I5043 Acute on chronic combined systolic (congestive) and diastolic (congestive) heart failure: Secondary | ICD-10-CM | POA: Diagnosis not present

## 2024-11-01 LAB — ECHOCARDIOGRAM COMPLETE
Area-P 1/2: 3.48 cm2
S' Lateral: 2.4 cm

## 2024-11-01 LAB — COMPREHENSIVE METABOLIC PANEL WITH GFR
ALT: 7 U/L (ref 0–44)
ALT: 7 U/L (ref 0–44)
AST: 24 U/L (ref 15–41)
AST: 17 U/L (ref 15–41)
Albumin: 3.6 g/dL (ref 3.5–5.0)
Albumin: 3.7 g/dL (ref 3.5–5.0)
Alkaline Phosphatase: 106 U/L (ref 38–126)
Alkaline Phosphatase: 99 U/L (ref 38–126)
Anion gap: 9 (ref 5–15)
Anion gap: 9 (ref 5–15)
BUN: 19 mg/dL (ref 8–23)
BUN: 19 mg/dL (ref 8–23)
CO2: 27 mmol/L (ref 22–32)
CO2: 28 mmol/L (ref 22–32)
Calcium: 8.8 mg/dL — ABNORMAL LOW (ref 8.9–10.3)
Calcium: 8.8 mg/dL — ABNORMAL LOW (ref 8.9–10.3)
Chloride: 102 mmol/L (ref 98–111)
Chloride: 103 mmol/L (ref 98–111)
Creatinine, Ser: 0.87 mg/dL (ref 0.44–1.00)
Creatinine, Ser: 0.86 mg/dL (ref 0.44–1.00)
GFR, Estimated: >60 mL/min (ref >60)
GFR, Estimated: >60 mL/min (ref >60)
Glucose, Bld: 127 mg/dL — ABNORMAL HIGH (ref 70–99)
Glucose, Bld: 103 mg/dL — ABNORMAL HIGH (ref 70–99)
Potassium: 3.6 mmol/L (ref 3.5–5.1)
Potassium: 3.7 mmol/L (ref 3.5–5.1)
Sodium: 138 mmol/L (ref 135–145)
Sodium: 140 mmol/L (ref 135–145)
Total Bilirubin: 0.5 mg/dL (ref 0.0–1.2)
Total Bilirubin: 0.4 mg/dL (ref 0.0–1.2)
Total Protein: 7.1 g/dL (ref 6.5–8.1)
Total Protein: 6.8 g/dL (ref 6.5–8.1)

## 2024-11-01 LAB — CBC
HCT: 35.7 % — ABNORMAL LOW (ref 36.0–46.0)
Hemoglobin: 11.7 g/dL — ABNORMAL LOW (ref 12.0–15.0)
MCH: 29.4 pg (ref 26.0–34.0)
MCHC: 32.8 g/dL (ref 30.0–36.0)
MCV: 89.7 fL (ref 80.0–100.0)
Platelets: 204 K/uL (ref 150–400)
RBC: 3.98 MIL/uL (ref 3.87–5.11)
RDW: 14.6 % (ref 11.5–15.5)
WBC: 6.8 K/uL (ref 4.0–10.5)
nRBC: 0 % (ref 0.0–0.2)

## 2024-11-01 LAB — MAGNESIUM
Magnesium: 1.8 mg/dL (ref 1.7–2.4)
Magnesium: 1.9 mg/dL (ref 1.7–2.4)

## 2024-11-01 LAB — LACTIC ACID, PLASMA
Lactic Acid, Venous: 0.9 mmol/L (ref 0.5–1.9)
Lactic Acid, Venous: 1.9 mmol/L (ref 0.5–1.9)

## 2024-11-01 LAB — PREALBUMIN: Prealbumin: 9 mg/dL — ABNORMAL LOW (ref 18–38)

## 2024-11-01 LAB — MRSA NEXT GEN BY PCR, NASAL: MRSA by PCR Next Gen: NOT DETECTED

## 2024-11-01 LAB — PHOSPHORUS
Phosphorus: 3 mg/dL (ref 2.5–4.6)
Phosphorus: 3.4 mg/dL (ref 2.5–4.6)

## 2024-11-01 LAB — TROPONIN T, HIGH SENSITIVITY
Troponin T High Sensitivity: 32 ng/L — ABNORMAL HIGH (ref 0–19)
Troponin T High Sensitivity: 31 ng/L — ABNORMAL HIGH (ref 0–19)

## 2024-11-01 LAB — TSH: TSH: 0.567 u[IU]/mL (ref 0.350–4.500)

## 2024-11-01 LAB — CK: Total CK: 75 U/L (ref 38–234)

## 2024-11-01 MED ORDER — ACETAMINOPHEN 650 MG RE SUPP: 650.0000 mg | Freq: Four times a day (QID) | RECTAL | Status: DC | PRN

## 2024-11-01 MED ORDER — SODIUM CHLORIDE 0.9% FLUSH
3.0000 mL | Freq: Two times a day (BID) | INTRAVENOUS | Status: DC
  Administered 2024-11-01 – 2024-11-03 (×5): 3 mL via INTRAVENOUS

## 2024-11-01 MED ORDER — ACETAMINOPHEN 325 MG PO TABS: 650.0000 mg | ORAL_TABLET | Freq: Four times a day (QID) | ORAL | Status: DC | PRN

## 2024-11-01 MED ORDER — APIXABAN 5 MG PO TABS
5.0000 mg | ORAL_TABLET | Freq: Two times a day (BID) | ORAL | Status: DC
  Administered 2024-11-01 – 2024-11-03 (×5): 5 mg via ORAL
  Filled 2024-11-01 (×5): qty 1

## 2024-11-01 MED ORDER — FUROSEMIDE 10 MG/ML IJ SOLN
40.0000 mg | Freq: Every day | INTRAMUSCULAR | Status: DC
  Administered 2024-11-01: 40 mg via INTRAVENOUS
  Filled 2024-11-01: qty 4

## 2024-11-01 MED ORDER — MIRTAZAPINE 7.5 MG PO TABS: 7.5000 mg | ORAL_TABLET | Freq: Every day | ORAL | Status: DC

## 2024-11-01 MED ORDER — ONDANSETRON HCL 4 MG PO TABS: 4.0000 mg | ORAL_TABLET | Freq: Four times a day (QID) | ORAL | Status: DC | PRN

## 2024-11-01 MED ORDER — SODIUM CHLORIDE 0.9% FLUSH: 3.0000 mL | INTRAVENOUS | Status: DC | PRN

## 2024-11-01 MED ORDER — METOPROLOL SUCCINATE ER 25 MG PO TB24
25.0000 mg | ORAL_TABLET | Freq: Every day | ORAL | Status: DC
  Administered 2024-11-01: 25 mg via ORAL
  Filled 2024-11-01: qty 1

## 2024-11-01 MED ORDER — LEVOTHYROXINE SODIUM 125 MCG PO TABS
125.0000 ug | ORAL_TABLET | Freq: Every day | ORAL | Status: DC
  Administered 2024-11-02 – 2024-11-03 (×2): 125 ug via ORAL
  Filled 2024-11-01 (×2): qty 1

## 2024-11-01 MED ORDER — PENTOXIFYLLINE ER 400 MG PO TBCR: 400.0000 mg | EXTENDED_RELEASE_TABLET | Freq: Every day | ORAL | Status: DC

## 2024-11-01 MED ORDER — ONDANSETRON HCL 4 MG/2ML IJ SOLN
4.0000 mg | Freq: Four times a day (QID) | INTRAMUSCULAR | Status: DC | PRN
  Administered 2024-11-01: 4 mg via INTRAVENOUS
  Filled 2024-11-01 (×2): qty 2

## 2024-11-01 MED ORDER — PANTOPRAZOLE SODIUM 40 MG PO TBEC
40.0000 mg | DELAYED_RELEASE_TABLET | Freq: Every day | ORAL | Status: DC
  Administered 2024-11-01 – 2024-11-03 (×3): 40 mg via ORAL
  Filled 2024-11-01 (×3): qty 1

## 2024-11-01 MED ORDER — HYDROCODONE-ACETAMINOPHEN 5-325 MG PO TABS
1.0000 | ORAL_TABLET | ORAL | Status: DC | PRN
  Administered 2024-11-01: 2 via ORAL
  Administered 2024-11-02 – 2024-11-03 (×3): 1 via ORAL
  Filled 2024-11-01 (×3): qty 1
  Filled 2024-11-01: qty 2

## 2024-11-01 MED ORDER — SODIUM CHLORIDE 0.9 % IV SOLN: 250.0000 mL | INTRAVENOUS | Status: AC | PRN

## 2024-11-01 NOTE — Progress Notes (Signed)
  [facility] Progress Note  Patient: [name]     FMW:[id number]  DOA: 10/31/2024   PCP: Dwight Trula SQUIBB, MD       Brief hospital course: This is an 89 year old female with interstitial lung disease, atrial fibrillation, ascending aortic aneurysm, hypertension, mitral valve prolapse, chronic diastolic heart failure and hypothyroidism who presents to the hospital for left lower extremity swelling.  The patient has chronic lymphedema and states that overall her legs had been doing well however over the past few days her left leg has gotten more swollen and red.  Subjective:  Continues to have redness and swelling in left lower extremity which is unchanged from yesterday.  Assessment and Plan: Principal Problem:   Cellulitis - Cefepime and Vanc- follow - Venous ultrasound neg for DVT  Active Problems:   Acute on chronic diastolic CHF (congestive heart failure) (HCC) - Hold further Lasix - SBP is in 70s     PAF (paroxysmal atrial fibrillation) - cont  Eliquis - hold Toprol  for hypotension  Hypotension - check orthostatic vitals    Hypothyroidism - TSH normal    ILD (interstitial lung disease)  - stable        Code Status: Full Code Total time on patient care: 35 min DVT prophylaxis:  SCDs Start: 11/01/24 0921 apixaban  (ELIQUIS ) tablet 5 mg     Objective:   Vitals:   11/01/24 1138 11/01/24 1300 11/01/24 1425 11/01/24 1430  BP:  98/63 (!) 78/56 (!) 78/56  Pulse:  69 70   Resp:  20 19   Temp: 98.3 F (36.8 C)     TempSrc: Oral     SpO2:  92% 90%    There were no vitals filed for this visit. Exam: General exam: Appears comfortable  HEENT: oral mucosa moist Respiratory system: Clear to auscultation.  Cardiovascular system: S1 & S2 heard  Gastrointestinal system: Abdomen soft, non-tender, nondistended. Normal bowel sounds   Extremities: No cyanosis, clubbing - she has diffuse edema and erythema of the left leg from the ankle to the knee with tenderness  on exam Psychiatry:  Mood & affect appropriate.      CBC: Recent Labs  Lab 10/31/24 1637 11/01/24 1313  WBC 7.7 6.8  NEUTROABS 4.3  --   HGB 11.3* 11.7*  HCT 35.7* 35.7*  MCV 90.8 89.7  PLT 202 204   Basic Metabolic Panel: Recent Labs  Lab 10/31/24 1637 11/01/24 0050 11/01/24 1313  NA 139 138 140  K 3.6 3.6 3.7  CL 103 102 103  CO2 28 27 28   GLUCOSE 127* 127* 103*  BUN 17 19 19   CREATININE 0.87 0.87 0.86  CALCIUM 9.2 8.8* 8.8*  MG  --  1.8 1.9  PHOS  --  3.0 3.4     Scheduled Meds:  apixaban   5 mg Oral BID   [START ON 11/02/2024] levothyroxine   125 mcg Oral QAC breakfast   pantoprazole   40 mg Oral Daily   sodium chloride  flush  3 mL Intravenous Q12H    Imaging and lab data personally reviewed   Author: Loxley Cibrian  11/01/2024 2:46 PM  To contact [facility]>   Check the care team in [facility] and look for the attending/consulting [facility] provider listed  Log into [email or web address] and use [place]'s universal password   Go to> [facility]  and find provider  If you still have difficulty reaching the provider, please page the [facility] (Director on Call) for the Hospitalists listed on amion

## 2024-11-01 NOTE — ED Notes (Addendum)
 Dr. Earley notified about patients BP 78/56. Patient denies dizzy or light headed.

## 2024-11-01 NOTE — Progress Notes (Signed)
  Echocardiogram 2D Echocardiogram has been performed.  [name] 11/01/2024, 2:25 PM

## 2024-11-02 DIAGNOSIS — L03116 Cellulitis of left lower limb: Secondary | ICD-10-CM | POA: Diagnosis not present

## 2024-11-02 MED ORDER — LOSARTAN POTASSIUM 50 MG PO TABS
50.0000 mg | ORAL_TABLET | Freq: Every day | ORAL | Status: DC
  Administered 2024-11-03: 50 mg via ORAL
  Filled 2024-11-02: qty 1

## 2024-11-02 MED ORDER — METOPROLOL SUCCINATE ER 50 MG PO TB24
25.0000 mg | ORAL_TABLET | Freq: Every day | ORAL | Status: DC
  Administered 2024-11-02 – 2024-11-03 (×2): 25 mg via ORAL
  Filled 2024-11-02 (×2): qty 1

## 2024-11-02 NOTE — Progress Notes (Signed)
 Orthostatic VS below  11/02/24 1631  Level of Consciousness  Level of Consciousness Alert  MEWS COLOR  MEWS Score Color Green  Orthostatic Lying   BP- Lying 114/52  Pulse- Lying 72  Orthostatic Sitting  BP- Sitting 127/75  Pulse- Sitting 73  Orthostatic Standing at 0 minutes  BP- Standing at 0 minutes (!) 104/91  Pulse- Standing at 0 minutes 74  Orthostatic Standing at 3 minutes  BP- Standing at 3 minutes 102/76  Pain Assessment  Pain Scale 0-10  Pain Score 5  Pain Location Heel  Pain Intervention(s) Medication (See eMAR)  MEWS Score  MEWS Temp 0  MEWS Systolic 1  MEWS Pulse 0  MEWS RR 0  MEWS LOC 0  MEWS Score 1

## 2024-11-02 NOTE — Progress Notes (Signed)
 [facility] Progress Note  Patient: [name]     FMW:[id number]  DOA: 10/31/2024   PCP: Dwight Trula SQUIBB, MD       Brief hospital course: This is an 89 year old female with interstitial lung disease, atrial fibrillation, ascending aortic aneurysm, hypertension, mitral valve prolapse, chronic diastolic heart failure and hypothyroidism who presents to the hospital for left lower extremity swelling.  The patient has chronic lymphedema and states that overall her legs had been doing well however over the past few days her left leg has gotten more swollen and red.  Subjective:  Persistent redness and swelling and pain in her left leg.  She was nauseated last night.  Assessment and Plan: Principal Problem:   Cellulitis - Cefepime  and Vanc started in ED-DC Vanco-continue cefepime  - Leg has not improved today in fact appears more edematous and erythematous in the medial aspect of her thigh as well - Venous ultrasound neg for DVT  Active Problems:   Acute on chronic diastolic CHF (congestive heart failure) (HCC) - Hold further Lasix - SBP is in 70s on 11/20 but appeared to quickly improved to 142/86 without any intervention and therefore may have been erroneous - At home, she takes furosemide  as needed for pedal edema-Will continue to hold it and focus on elevating her left lower extremity-no symptoms of pulmonary edema at this time (may have been present on admission?) -Resume losartan  tomorrow as long as BP is stable     PAF (paroxysmal atrial fibrillation) - cont  Eliquis -continue Toprol     Hypothyroidism - TSH normal    ILD (interstitial lung disease)  - stable        Code Status: Full Code Total time on patient care: 35 min DVT prophylaxis:  SCDs Start: 11/01/24 0921 apixaban  (ELIQUIS ) tablet 5 mg     Objective:   Vitals:   11/01/24 2158 11/02/24 0146 11/02/24 0500 11/02/24 0546  BP: (!) 148/77 126/68  133/79  Pulse: 71 71  75  Resp: 20 18  18   Temp: 97.8 F  (36.6 C) 97.8 F (36.6 C)  97.8 F (36.6 C)  TempSrc:    Oral  SpO2: 90% 98%  (!) 89%  Weight:   70.1 kg   Height:   5' 7 (1.702 m)    Filed Weights   11/02/24 0500  Weight: 70.1 kg   Exam: General exam: Appears comfortable  HEENT: oral mucosa moist Respiratory system: Clear to auscultation.  Cardiovascular system: S1 & S2 heard  Gastrointestinal system: Abdomen soft, non-tender, nondistended. Normal bowel sounds   Extremities: No cyanosis, clubbing - she has diffuse edema and erythema of the left leg from the ankle to the knee with tenderness on exam-on the medial aspect of her left thigh she has erythema and edema as well Psychiatry:  Mood & affect appropriate.      CBC: Recent Labs  Lab 10/31/24 1637 11/01/24 1313  WBC 7.7 6.8  NEUTROABS 4.3  --   HGB 11.3* 11.7*  HCT 35.7* 35.7*  MCV 90.8 89.7  PLT 202 204   Basic Metabolic Panel: Recent Labs  Lab 10/31/24 1637 11/01/24 0050 11/01/24 1313  NA 139 138 140  K 3.6 3.6 3.7  CL 103 102 103  CO2 28 27 28   GLUCOSE 127* 127* 103*  BUN 17 19 19   CREATININE 0.87 0.87 0.86  CALCIUM 9.2 8.8* 8.8*  MG  --  1.8 1.9  PHOS  --  3.0 3.4     Scheduled Meds:  apixaban   5 mg Oral BID   levothyroxine   125 mcg Oral QAC breakfast   metoprolol  succinate  25 mg Oral Daily   pantoprazole   40 mg Oral Daily   sodium chloride  flush  3 mL Intravenous Q12H    Imaging and lab data personally reviewed   Author: Donasia Wimes  11/02/2024 1:37 PM  To contact [facility]>   Check the care team in [facility] and look for the attending/consulting [facility] provider listed  Log into [email or web address] and use [place]'s universal password   Go to> [facility]  and find provider  If you still have difficulty reaching the provider, please page the [facility] (Director on Call) for the Hospitalists listed on amion

## 2024-11-02 NOTE — TOC Initial Note (Signed)
 Transition of Care [facility]) - Initial/Assessment Note   Patient Details  Name: [name] MRN: [id number] Date of Birth: 09-13-1933  [facility]) CM/SW Contact:    Duwaine GORMAN Aran, LCSW Phone Number: 11/02/2024, 8:50 AM  Clinical Narrative: Patient is from home with spouse. Care management consulted for heart failure screening. Patient has been referred to the heart failure navigation team, so consult will cleared at this time. Patient is on IV antibiotics and 2L/min oxygen. Care management following for possible discharge needs.  Expected Discharge Plan: Home/Self Care Barriers to Discharge: Continued Medical Work up  Expected Discharge Plan and Services In-house Referral: Clinical Social Work Living arrangements for the past 2 months: Single Family Home  Prior Living Arrangements/Services Living arrangements for the past 2 months: Single Family Home Lives with:: Spouse Patient language and need for interpreter reviewed:: Yes Do you feel safe going back to the place where you live?: Yes      Need for Family Participation in Patient Care: No (Comment) Care giver support system in place?: Yes (comment) Criminal Activity/Legal Involvement Pertinent to Current Situation/Hospitalization: No - Comment as needed  Activities of Daily Living ADL Screening (condition at time of admission) Independently performs ADLs?: Yes (appropriate for developmental age) Is the patient deaf or have difficulty hearing?: No Does the patient have difficulty seeing, even when wearing glasses/contacts?: No Does the patient have difficulty concentrating, remembering, or making decisions?: No  Emotional Assessment Orientation: : Oriented to Self, Oriented to Place, Oriented to  Time, Oriented to Situation Alcohol  / Substance Use: Not Applicable Psych Involvement: No (comment)  Admission diagnosis:  Leg edema [R60.0] Acute on chronic combined systolic and diastolic CHF (congestive heart failure)  (HCC) [I50.43] Cellulitis of lower extremity, unspecified laterality [L03.119] Acute on chronic heart failure, unspecified heart failure type (HCC) [I50.9] Patient Active Problem List   Diagnosis Date Noted   Cellulitis 10/31/2024   Acute on chronic combined systolic and diastolic CHF (congestive heart failure) (HCC) 10/31/2024   UTI (urinary tract infection) 05/09/2024   Acute costochondritis 05/06/2024   Acute on chronic diastolic CHF (congestive heart failure) (HCC) 05/06/2024   Chronic respiratory failure with hypoxia (HCC) 05/01/2024   Mixed stress and urge urinary incontinence 09/29/2023   Nocturia 09/29/2023   Abnormal finding on urinalysis 09/29/2023   Vaginal atrophy 09/29/2023   Chronic diastolic heart failure (HCC) 03/27/2021   PVC's (premature ventricular contractions) 09/23/2020   Pacemaker 01/10/2020   Pain in left knee 09/27/2018   ILD (interstitial lung disease) (HCC) 02/21/2017   Dyspnea and respiratory abnormality 02/11/2017   Abnormality of gait and mobility 01/19/2017   Hypothyroidism 01/19/2017   Other specified disorders of bone density and structure, unspecified site 01/19/2017   Genetic testing 03/13/2015   Paresthesia of skin 03/03/2015   Breast cancer of lower-outer quadrant of left female breast (HCC) 02/06/2015   Hematuria    PAF (paroxysmal atrial fibrillation) (HCC)    Chronic venous insufficiency    SVT (supraventricular tachycardia) 06/12/2010   Essential hypertension 04/22/2010   MITRAL VALVE PROLAPSE 04/22/2010   PCP:  Dwight Trula SQUIBB, MD Pharmacy:   [facility] [place], [place] - [address] [facility] [address] [place] [address] Phone: [phone] Fax: [phone]  Social Drivers of Health (SDOH) Social History: SDOH Screenings   Food Insecurity: No Food Insecurity (11/01/2024)  Housing: Low Risk  (11/01/2024)  Transportation Needs: No Transportation Needs (11/01/2024)  Utilities: Not At Risk  (11/01/2024)  Depression (PHQ2-9): Low Risk  (06/21/2024)  Social Connections: Socially Integrated (11/01/2024)  Tobacco Use: Low Risk  (11/01/2024)   SDOH Interventions:    Readmission Risk Interventions     No data to display

## 2024-11-03 DIAGNOSIS — L03116 Cellulitis of left lower limb: Secondary | ICD-10-CM | POA: Diagnosis not present

## 2024-11-03 DIAGNOSIS — L03119 Cellulitis of unspecified part of limb: Secondary | ICD-10-CM | POA: Diagnosis not present

## 2024-11-03 MED ORDER — LOSARTAN POTASSIUM 50 MG PO TABS
25.0000 mg | ORAL_TABLET | Freq: Every day | ORAL | 3 refills | Status: AC
Start: 2024-11-03 — End: ?

## 2024-11-03 MED ORDER — LOSARTAN POTASSIUM 25 MG PO TABS: 25.0000 mg | ORAL_TABLET | Freq: Every day | ORAL | Status: DC

## 2024-11-03 MED ORDER — CEFDINIR 300 MG PO CAPS: 300.0000 mg | ORAL_CAPSULE | Freq: Two times a day (BID) | ORAL | 0 refills | Status: AC

## 2024-11-03 NOTE — Plan of Care (Signed)
   Problem: Education: Goal: Knowledge of General Education information will improve Description: Including pain rating scale, medication(s)/side effects and non-pharmacologic comfort measures Outcome: Progressing   Problem: Clinical Measurements: Goal: Ability to maintain clinical measurements within normal limits will improve Outcome: Progressing

## 2024-11-03 NOTE — Discharge Summary (Signed)
 Physician Discharge Summary  [name] FMW:[id number] DOB: 1933-06-16 DOA: 10/31/2024  PCP: Dwight Trula SQUIBB, MD  Admit date: 10/31/2024 Discharge date: 11/03/2024 Discharging to: home     Discharge Diagnoses:   Principal Problem:   Cellulitis Active Problems:   Acute on chronic diastolic CHF (congestive heart failure) (HCC)   Essential hypertension   PAF (paroxysmal atrial fibrillation) (HCC)   Hypothyroidism   ILD (interstitial lung disease) (HCC)   SVT (supraventricular tachycardia)   Pacemaker   Acute on chronic combined systolic and diastolic CHF (congestive heart failure) (HCC)         Brief hospital course: This is an 89 year old female with interstitial lung disease, atrial fibrillation, ascending aortic aneurysm, hypertension, mitral valve prolapse, chronic diastolic heart failure and hypothyroidism who presents to the hospital for left lower extremity swelling.  The patient has chronic lymphedema and states that overall her legs had been doing well however over the past few days her left leg has gotten more swollen and red.   Assessment and Plan: Principal Problem:   Cellulitis- h/o b/l lymphedema  - of left leg- improved- transition to Ceftin on dc- advised to keep LE elevated as much as possible until edema improves - Venous ultrasound neg for DVT   Active Problems:   Acute on chronic diastolic CHF (congestive heart failure) (HCC) - Treated with IV Lasix  when first admitted - Held further Lasix  while in the hospital - SBP is in 70s on 11/20 but appeared to quickly improved to 142/86 without any intervention and therefore may have been erroneous     PAF (paroxysmal atrial fibrillation) - cont  Eliquis -continue Toprol      Hypothyroidism - TSH normal     ILD (interstitial lung disease)  - stable        Wound 11/01/24 1733 Pressure Injury Heel Right Stage 2 -  Partial thickness loss of dermis presenting as a shallow open injury with a red, pink wound bed  without slough. (Active)   Discharge Instructions   Allergies as of 11/03/2024       Reactions   Quinolones Other (See Comments)   Patient with ATAA (ascending thoracic aortic aneurysm)   Adhesive [tape] Rash, Other (See Comments)   Including EKG electrodes and the like   Codeine Other (See Comments)   Reaction not recalled   Latex Itching   Oxycodone-aspirin  Nausea And Vomiting   Percodan [oxycodone-aspirin ] Nausea And Vomiting        Medication List     TAKE these medications    acetaminophen  325 MG tablet Commonly known as: TYLENOL  Take 2 tablets (650 mg total) by mouth every 6 (six) hours as needed for mild pain (pain score 1-3) (or Fever >/= 101).   cefdinir  300 MG capsule Commonly known as: OMNICEF  Take 1 capsule (300 mg total) by mouth 2 (two) times daily for 5 days.   Eliquis  5 MG Tabs tablet Generic drug: apixaban  TAKE 1 TABLET BY MOUTH TWICE  DAILY   estradiol  0.1 MG/GM vaginal cream Commonly known as: ESTRACE  Place 0.5g nightly for two weeks then twice a week after   furosemide  40 MG tablet Commonly known as: LASIX  Take 1 tablet (40 mg total) by mouth daily. What changed:  when to take this reasons to take this additional instructions   gabapentin 100 MG capsule Commonly known as: NEURONTIN Take 100 mg by mouth at bedtime.   Gemtesa  75 MG Tabs Generic drug: Vibegron  Take 1 tablet (75 mg total) by mouth daily.  levothyroxine  125 MCG tablet Commonly known as: SYNTHROID  Take 125 mcg by mouth daily before breakfast.   losartan  50 MG tablet Commonly known as: COZAAR  Take 0.5 tablets (25 mg total) by mouth daily. What changed: how much to take   metoprolol  succinate 25 MG 24 hr tablet Commonly known as: TOPROL -XL Take 1 tablet (25 mg total) by mouth daily. What changed: when to take this   omeprazole  20 MG capsule Commonly known as: PRILOSEC Take 1 capsule (20 mg total) by mouth daily. What changed: when to take this   triamcinolone   cream 0.1 % Commonly known as: KENALOG  Apply 1 Application topically 2 (two) times daily as needed (for red, firm, bumpy skin on the right lower leg- AVOID ANY ULCERS).   Vitamin D (Ergocalciferol) 1.25 MG (50000 UNIT) Caps capsule Commonly known as: DRISDOL Take 50,000 Units by mouth every Thursday.        Follow-up Information     Dwight Trula SQUIBB, MD Follow up in 1 week(s).   Specialty: Internal Medicine Why: Please see your PCP in 1 wk to check you leg and your BP. Contact information: [address] [place] [address] [phone]                    The results of significant diagnostics from this hospitalization (including imaging, microbiology, ancillary and laboratory) are listed below for reference.    ECHOCARDIOGRAM COMPLETE Result Date: 11/01/2024    ECHOCARDIOGRAM REPORT   Patient Name:   [name] Date of Exam: 11/01/2024 Medical Rec #:  [id number]      Height:       67.0 in Accession #:    [id number]     Weight:       152.0 lb Date of Birth:  07/19/1933      BSA:          1.800 m Patient Age:    89 years       BP:           104/91 mmHg Patient Gender: F              HR:           71 bpm. Exam Location:  Inpatient Procedure: 2D Echo (Both Spectral and Color Flow Doppler were utilized during            procedure). Indications:    CHF  History:        Patient has prior history of Echocardiogram examinations. CHF;                 Risk Factors:Hypertension.  Sonographer:    Charmaine Gaskins Referring Phys: [id number] ANASTASSIA DOUTOVA IMPRESSIONS  1. Left ventricular ejection fraction, by estimation, is 60 to 65%. The left ventricle has normal function. The left ventricle has no regional wall motion abnormalities. There is mild left ventricular hypertrophy. Left ventricular diastolic parameters are indeterminate. Elevated left atrial pressure.  2. Right ventricular systolic function is normal. The right ventricular size is mildly enlarged.  3. Left atrial size  was severely dilated.  4. Right atrial size was severely dilated.  5. Mild posterior leaflet mitral valve prolapse. The mitral valve is degenerative. Mild to moderate mitral valve regurgitation. No evidence of mitral stenosis.  6. Tricuspid valve regurgitation is mild to moderate.  7. The aortic valve is grossly normal. There is mild calcification of the aortic valve. Aortic valve regurgitation is trivial. No aortic stenosis is present.  8.  Aortic dilatation noted. There is borderline dilatation of the ascending aorta, measuring 43 mm. FINDINGS  Left Ventricle: Left ventricular ejection fraction, by estimation, is 60 to 65%. The left ventricle has normal function. The left ventricle has no regional wall motion abnormalities. The left ventricular internal cavity size was normal in size. There is  mild left ventricular hypertrophy. Left ventricular diastolic parameters are indeterminate. Elevated left atrial pressure. Right Ventricle: The right ventricular size is mildly enlarged. No increase in right ventricular wall thickness. Right ventricular systolic function is normal. Left Atrium: Left atrial size was severely dilated. Right Atrium: Right atrial size was severely dilated. Pericardium: There is no evidence of pericardial effusion. Mitral Valve: Mild posterior leaflet mitral valve prolapse. The mitral valve is degenerative in appearance. Mild mitral annular calcification. Mild to moderate mitral valve regurgitation. No evidence of mitral valve stenosis. MV peak gradient, 3.6 mmHg. The mean mitral valve gradient is 1.0 mmHg. Tricuspid Valve: The tricuspid valve is normal in structure. Tricuspid valve regurgitation is mild to moderate. No evidence of tricuspid stenosis. Aortic Valve: The aortic valve is grossly normal. There is mild calcification of the aortic valve. Aortic valve regurgitation is trivial. No aortic stenosis is present. Pulmonic Valve: The pulmonic valve was normal in structure. Pulmonic valve  regurgitation is trivial. No evidence of pulmonic stenosis. Aorta: Aortic dilatation noted. There is borderline dilatation of the ascending aorta, measuring 43 mm. Venous: The inferior vena cava was not well visualized. IAS/Shunts: The interatrial septum was not well visualized.  LEFT VENTRICLE PLAX 2D LVIDd:         3.80 cm   Diastology LVIDs:         2.40 cm   LV e' medial:    5.77 cm/s LV PW:         1.10 cm   LV E/e' medial:  17.7 LV IVS:        1.20 cm   LV e' lateral:   8.59 cm/s LVOT diam:     2.40 cm   LV E/e' lateral: 11.9 LV SV:         64 LV SV Index:   36 LVOT Area:     4.52 cm  RIGHT VENTRICLE RV Basal diam:  3.50 cm RV Mid diam:    3.30 cm RV S prime:     15.60 cm/s LEFT ATRIUM              Index        RIGHT ATRIUM           Index LA diam:        6.20 cm  3.45 cm/m   RA Area:     29.70 cm LA Vol (A2C):   147.0 ml 81.68 ml/m  RA Volume:   98.10 ml  54.51 ml/m LA Vol (A4C):   156.0 ml 86.68 ml/m LA Biplane Vol: 156.0 ml 86.68 ml/m  AORTIC VALVE LVOT Vmax:   82.69 cm/s LVOT Vmean:  60.478 cm/s LVOT VTI:    0.142 m  AORTA Ao Root diam: 3.80 cm Ao Asc diam:  4.30 cm MITRAL VALVE                TRICUSPID VALVE MV Area (PHT): 3.48 cm     TR Peak grad:   32.5 mmHg MV Peak grad:  3.6 mmHg     TR Vmax:        285.00 cm/s MV Mean grad:  1.0 mmHg MV Vmax:  0.95 m/s     SHUNTS MV Vmean:      45.1 cm/s    Systemic VTI:  0.14 m MV Decel Time: 218 msec     Systemic Diam: 2.40 cm MV E velocity: 102.00 cm/s Soyla Merck MD Electronically signed by Soyla Merck MD Signature Date/Time: 11/01/2024/9:14:50 PM    Final    DG Tibia/Fibula Left Result Date: 10/31/2024 EXAM: 1 VIEW(S) XRAY OF THE LEFT TIBIA AND FIBULA 10/31/2024 08:24:00 PM COMPARISON: None available. CLINICAL HISTORY: r/o osteo, fx FINDINGS: BONES AND JOINTS: Mild to moderate degenerative changes of the knee. Superior patellar enthesopathy. No acute fracture. No focal osseous lesion. No joint dislocation. SOFT TISSUES: Diffuse soft  tissue edema. Vascular calcifications. IMPRESSION: 1. No acute fracture or dislocation. 2. Diffuse soft tissue edema. Electronically signed by: Morgane Naveau MD 10/31/2024 08:44 PM EST RP Workstation: [id number]   DG Chest Portable 1 View Result Date: 10/31/2024 EXAM: 1 VIEW(S) XRAY OF THE CHEST 10/31/2024 08:24:00 PM COMPARISON: CXR 05/06/2024, CT Chest 07/12/2024. CLINICAL HISTORY: r/o effusion FINDINGS: LINES, TUBES AND DEVICES: Stable left dual lead. LUNGS AND PLEURA: Low lung volumes with crowding of bronchovascular structures are noted. Small left pleural effusion, decreased since previous. Patchy opacities at the left lung base, improved since previous. Diffuse coarsened interstitial markings are present. No pneumothorax. HEART AND MEDIASTINUM: No acute abnormality of the cardiac and mediastinal silhouettes. Aortic atherosclerosis is present. BONES AND SOFT TISSUES: Rotator cuff anchors are present in the left humeral head. IMPRESSION: 1. Small left pleural effusion, decreased since previous. 2. Patchy opacities at the left lung base, improved since previous. 3. Low lung volumes with crowding of bronchovascular structures. Electronically signed by: Morgane Naveau MD 10/31/2024 08:42 PM EST RP Workstation: [id number]   VAS US  LOWER EXTREMITY VENOUS (DVT) (ONLY [facility] & [facility]) Result Date: 10/31/2024  Lower Venous DVT Study Patient Name:  [name]  Date of Exam:   10/31/2024 Medical Rec #: [id number]       Accession #:    [id number] Date of Birth: Jun 03, 1933       Patient Gender: F Patient Age:   15 years Exam Location:  [facility] Procedure:      VAS US  LOWER EXTREMITY VENOUS (DVT) Referring Phys: TINNIE MATTER --------------------------------------------------------------------------------  Indications: Swelling.  Risk Factors: None identified. Limitations: Poor ultrasound/tissue interface. Comparison Study: No prior studies. Performing Technologist: Cordella Collet RVT  Examination Guidelines:  A complete evaluation includes B-mode imaging, spectral Doppler, color Doppler, and power Doppler as needed of all accessible portions of each vessel. Bilateral testing is considered an integral part of a complete examination. Limited examinations for reoccurring indications may be performed as noted. The reflux portion of the exam is performed with the patient in reverse Trendelenburg.  +-----+---------------+---------+-----------+----------+--------------+ RIGHTCompressibilityPhasicitySpontaneityPropertiesThrombus Aging +-----+---------------+---------+-----------+----------+--------------+ CFV  Full           Yes      Yes                                 +-----+---------------+---------+-----------+----------+--------------+   +---------+---------------+---------+-----------+----------+-------------------+ LEFT     CompressibilityPhasicitySpontaneityPropertiesThrombus Aging      +---------+---------------+---------+-----------+----------+-------------------+ CFV      Full           Yes      Yes                                      +---------+---------------+---------+-----------+----------+-------------------+  SFJ      Full                                                             +---------+---------------+---------+-----------+----------+-------------------+ FV Prox  Full                                                             +---------+---------------+---------+-----------+----------+-------------------+ FV Mid   Full                                                             +---------+---------------+---------+-----------+----------+-------------------+ FV DistalFull                                                             +---------+---------------+---------+-----------+----------+-------------------+ PFV      Full                                                              +---------+---------------+---------+-----------+----------+-------------------+ POP      Full           Yes      Yes                                      +---------+---------------+---------+-----------+----------+-------------------+ PTV      Full                                                             +---------+---------------+---------+-----------+----------+-------------------+ PERO                                                  Not well visualized +---------+---------------+---------+-----------+----------+-------------------+    Summary: RIGHT: - No evidence of common femoral vein obstruction.   LEFT: - There is no evidence of deep vein thrombosis in the lower extremity. However, portions of this examination were limited- see technologist comments above.  - No cystic structure found in the popliteal fossa.  *See table(s) above for measurements and observations. Electronically signed by Lonni Gaskins MD on 10/31/2024 at 4:29:43 PM.    Final    Labs:   Basic Metabolic Panel: Recent Labs  Lab 10/31/24 1637 11/01/24 0050 11/01/24 1313  NA 139 138 140  K 3.6 3.6 3.7  CL 103 102 103  CO2 28 27 28   GLUCOSE 127* 127* 103*  BUN 17 19 19   CREATININE 0.87 0.87 0.86  CALCIUM 9.2 8.8* 8.8*  MG  --  1.8 1.9  PHOS  --  3.0 3.4     CBC: Recent Labs  Lab 10/31/24 1637 11/01/24 1313  WBC 7.7 6.8  NEUTROABS 4.3  --   HGB 11.3* 11.7*  HCT 35.7* 35.7*  MCV 90.8 89.7  PLT 202 204         SIGNED:   True Atlas, MD  [facility] 11/03/2024, 9:50 AM Time taking on discharge: 50 minutes

## 2024-11-03 NOTE — Discharge Instructions (Signed)
 Losartan  dose decreased due to low BP in the hospital. Ask your PCP to recheck next week please. Keep left leg elevated as much as possible when sitting to reduce the swelling.    You were cared for by a hospitalist during your hospital stay. Please review all of you discharge paperwork on the day of discharge and be sure you have all of your prescribed medications and please read the below instructions:  Once you are discharged, your primary care physician will handle any further medical issues. Please note that NO REFILLS for any discharge medications will be authorized once you are discharged as it is imperative that you return to your primary care physician (or establish a relationship with a primary care physician if you do not have one) for your aftercare needs. Please obtain a follow up appointment with your primary care physician within 1-2 weeks of discharge. Please take all your medications with you for your next visit with your Primary MD. Please request your Primary MD to go over all Hospital Tests and Procedures, Radiological results at the follow up appointment. In some cases, there will be blood work, cultures and biopsy results pending at the time of your discharge. Please request that your primary care M.D. goes through all the records of your hospital data and follows up on these results. Please get all hospital records sent to your primary MD by signing hospital release before you go home or request your primary care doctor's office to assist with obtaining medical records.   You must read complete instructions/literature along with all the possible adverse reactions/side effects for all the medicines that have been prescribed to you. Take any new medicines after you have completely understood and accpet all the possible adverse reactions/side effects.  Please take medications as prescribed and speak with your doctor if changes are needed.   If you have smoked or chewed tobacco in  the last 2 yrs please stop. Stop any regular alcohol   and or any recreational drug use. Wear Seat belts while driving.   If you had Pneumonia at the Hospital: Please get a 2 view Chest X ray done in 6-8 weeks after hospital discharge or sooner if instructed by your Primary MD.   If you have Congestive Heart Failure: Follow a cardiac low salt diet and 1.5 lit/day fluid restriction. Please call your Cardiologist or Primary MD anytime you have any of the following symptoms:  1) 3 pound weight gain in 24 hours or 5 pounds in 1 week  2) shortness of breath, with or without a dry hacking cough  3) increasing swelling in the feet or stomach  4) if you have to sleep on extra pillows at night in order to breathe   If you have Diabetes: Check blood sugars 4 times/day- once on AM empty stomach and then before each meal. Log in all results and show them to your primary doctor at your next visit. If glucose readings are often under 60 or above 400 call your primary MD to see if medication dosages need to be adjusted   If you have Syncope (passing out) or Seizure/Convulsions/Epilepsy: Please do not drive, operate heavy machinery, participate in activities at heights or participate in high speed sports until you have seen by Primary MD or a Neurologist and advised to do so again. Per [place]  DM[place] statutes, patients with seizures are not allowed to drive until they have been seizure-free for six months.  Use caution when using heavy equipment or power  tools. Avoid working on ladders or at heights. Take showers instead of baths. Ensure the water temperature is not too high on the home water heater. Do not go swimming alone. Do not lock yourself in a room alone (i.e. bathroom). When caring for infants or small children, sit down when holding, feeding, or changing them to minimize risk of injury to the child in the event you have a seizure. Maintain good sleep hygiene. Avoid alcohol .    If you had  Gastrointestinal Bleeding: Please ask your Primary MD to check a complete blood count within one week of discharge or at your next visit. Your endoscopic/colonoscopic biopsies that are pending at the time of discharge will also need to followed by your Primary MD.    [facility] can reach the hospitalist office at phone [phone] or fax [phone]   If you do not have a primary care physician, you can call [phone] for a physician referral.

## 2024-11-20 ENCOUNTER — Ambulatory Visit: Payer: Medicare Other

## 2024-11-22 ENCOUNTER — Ambulatory Visit: Payer: Self-pay | Admitting: Internal Medicine

## 2024-11-22 LAB — CUP PACEART REMOTE DEVICE CHECK
Battery Remaining Longevity: 7 mo
Battery Voltage: 2.77 V
Brady Statistic AP VP Percent: 0 %
Brady Statistic AP VS Percent: 0 %
Brady Statistic AS VP Percent: 94.08 %
Brady Statistic AS VS Percent: 5.92 %
Brady Statistic RA Percent Paced: 0 %
Brady Statistic RV Percent Paced: 94.08 %
Date Time Interrogation Session: 20251209003321
Implantable Lead Connection Status: 753985
Implantable Lead Connection Status: 753985
Implantable Lead Implant Date: 20180905
Implantable Lead Implant Date: 20180905
Implantable Lead Location: 753859
Implantable Lead Location: 753860
Implantable Lead Model: 3830
Implantable Lead Model: 5076
Implantable Pulse Generator Implant Date: 20180905
Lead Channel Impedance Value: 266 Ohm
Lead Channel Impedance Value: 342 Ohm
Lead Channel Impedance Value: 361 Ohm
Lead Channel Impedance Value: 532 Ohm
Lead Channel Pacing Threshold Amplitude: 0.5 V
Lead Channel Pacing Threshold Pulse Width: 0.4 ms
Lead Channel Sensing Intrinsic Amplitude: 1.125 mV
Lead Channel Sensing Intrinsic Amplitude: 1.125 mV
Lead Channel Sensing Intrinsic Amplitude: 2.875 mV
Lead Channel Sensing Intrinsic Amplitude: 3.125 mV
Lead Channel Setting Pacing Amplitude: 2.5 V
Lead Channel Setting Pacing Pulse Width: 1 ms
Lead Channel Setting Sensing Sensitivity: 0.9 mV
Zone Setting Status: 755011

## 2024-11-27 NOTE — Progress Notes (Signed)
 Remote PPM Transmission

## 2024-12-12 DIAGNOSIS — I480 Paroxysmal atrial fibrillation: Secondary | ICD-10-CM

## 2024-12-12 NOTE — Telephone Encounter (Signed)
 Eliquis  5mg  refill request received. Patient is 89 years old, weight-74.9kg, Crea-0.86 on 11/01/24, Diagnosis-Afib, and last seen by Daphne Barrack on 05/23/24. Dose is appropriate based on dosing criteria. Will send in refill to requested pharmacy.

## 2024-12-24 ENCOUNTER — Encounter: Payer: Self-pay | Admitting: *Deleted

## 2024-12-24 ENCOUNTER — Other Ambulatory Visit: Payer: Self-pay

## 2024-12-24 DIAGNOSIS — I7121 Aneurysm of the ascending aorta, without rupture: Secondary | ICD-10-CM

## 2025-01-15 ENCOUNTER — Ambulatory Visit (HOSPITAL_COMMUNITY)

## 2025-01-22 ENCOUNTER — Ambulatory Visit (HOSPITAL_COMMUNITY)

## 2025-01-29 ENCOUNTER — Ambulatory Visit

## 2025-02-19 ENCOUNTER — Ambulatory Visit

## 2025-05-21 ENCOUNTER — Ambulatory Visit

## 2025-06-24 ENCOUNTER — Encounter: Admitting: Adult Health

## 2025-08-20 ENCOUNTER — Ambulatory Visit

## 2025-11-19 ENCOUNTER — Ambulatory Visit
